# Patient Record
Sex: Male | Born: 1940 | Race: White | Hispanic: No | Marital: Married | Smoking: Former smoker
Health system: Southern US, Community
[De-identification: ages and names within clinical notes are randomized; demographics above are authoritative.]

## PROBLEM LIST (undated history)

## (undated) DIAGNOSIS — Z9981 Dependence on supplemental oxygen: Secondary | ICD-10-CM

## (undated) DIAGNOSIS — M109 Gout, unspecified: Secondary | ICD-10-CM

## (undated) DIAGNOSIS — F419 Anxiety disorder, unspecified: Secondary | ICD-10-CM

## (undated) DIAGNOSIS — N2581 Secondary hyperparathyroidism of renal origin: Secondary | ICD-10-CM

## (undated) DIAGNOSIS — I2699 Other pulmonary embolism without acute cor pulmonale: Secondary | ICD-10-CM

## (undated) DIAGNOSIS — E119 Type 2 diabetes mellitus without complications: Secondary | ICD-10-CM

## (undated) DIAGNOSIS — D649 Anemia, unspecified: Secondary | ICD-10-CM

## (undated) DIAGNOSIS — I5042 Chronic combined systolic (congestive) and diastolic (congestive) heart failure: Secondary | ICD-10-CM

## (undated) DIAGNOSIS — I739 Peripheral vascular disease, unspecified: Secondary | ICD-10-CM

## (undated) DIAGNOSIS — I2510 Atherosclerotic heart disease of native coronary artery without angina pectoris: Secondary | ICD-10-CM

## (undated) DIAGNOSIS — E1121 Type 2 diabetes mellitus with diabetic nephropathy: Secondary | ICD-10-CM

## (undated) DIAGNOSIS — K219 Gastro-esophageal reflux disease without esophagitis: Secondary | ICD-10-CM

## (undated) DIAGNOSIS — C44611 Basal cell carcinoma of skin of unspecified upper limb, including shoulder: Secondary | ICD-10-CM

## (undated) DIAGNOSIS — I10 Essential (primary) hypertension: Secondary | ICD-10-CM

## (undated) DIAGNOSIS — J439 Emphysema, unspecified: Secondary | ICD-10-CM

## (undated) DIAGNOSIS — R05 Cough: Secondary | ICD-10-CM

## (undated) DIAGNOSIS — F32A Depression, unspecified: Secondary | ICD-10-CM

## (undated) DIAGNOSIS — I69959 Hemiplegia and hemiparesis following unspecified cerebrovascular disease affecting unspecified side: Secondary | ICD-10-CM

## (undated) DIAGNOSIS — R609 Edema, unspecified: Secondary | ICD-10-CM

## (undated) DIAGNOSIS — F329 Major depressive disorder, single episode, unspecified: Secondary | ICD-10-CM

## (undated) DIAGNOSIS — N529 Male erectile dysfunction, unspecified: Secondary | ICD-10-CM

## (undated) DIAGNOSIS — T7840XA Allergy, unspecified, initial encounter: Secondary | ICD-10-CM

## (undated) DIAGNOSIS — Z125 Encounter for screening for malignant neoplasm of prostate: Secondary | ICD-10-CM

## (undated) DIAGNOSIS — M1990 Unspecified osteoarthritis, unspecified site: Secondary | ICD-10-CM

## (undated) DIAGNOSIS — C44621 Squamous cell carcinoma of skin of unspecified upper limb, including shoulder: Secondary | ICD-10-CM

## (undated) DIAGNOSIS — N183 Chronic kidney disease, stage 3 unspecified: Secondary | ICD-10-CM

## (undated) DIAGNOSIS — R0902 Hypoxemia: Secondary | ICD-10-CM

## (undated) DIAGNOSIS — M810 Age-related osteoporosis without current pathological fracture: Secondary | ICD-10-CM

## (undated) DIAGNOSIS — I219 Acute myocardial infarction, unspecified: Secondary | ICD-10-CM

## (undated) DIAGNOSIS — R059 Cough, unspecified: Secondary | ICD-10-CM

## (undated) DIAGNOSIS — E785 Hyperlipidemia, unspecified: Secondary | ICD-10-CM

## (undated) DIAGNOSIS — Z9289 Personal history of other medical treatment: Secondary | ICD-10-CM

## (undated) DIAGNOSIS — J449 Chronic obstructive pulmonary disease, unspecified: Secondary | ICD-10-CM

## (undated) DIAGNOSIS — I639 Cerebral infarction, unspecified: Secondary | ICD-10-CM

## (undated) HISTORY — DX: Male erectile dysfunction, unspecified: N52.9

## (undated) HISTORY — DX: Atherosclerotic heart disease of native coronary artery without angina pectoris: I25.10

## (undated) HISTORY — DX: Encounter for screening for malignant neoplasm of prostate: Z12.5

## (undated) HISTORY — DX: Edema, unspecified: R60.9

## (undated) HISTORY — DX: Anxiety disorder, unspecified: F41.9

## (undated) HISTORY — DX: Chronic kidney disease, stage 3 unspecified: N18.30

## (undated) HISTORY — DX: Emphysema, unspecified: J43.9

## (undated) HISTORY — PX: CATARACT EXTRACTION W/ INTRAOCULAR LENS  IMPLANT, BILATERAL: SHX1307

## (undated) HISTORY — DX: Hemiplegia and hemiparesis following unspecified cerebrovascular disease affecting unspecified side: I69.959

## (undated) HISTORY — DX: Unspecified osteoarthritis, unspecified site: M19.90

## (undated) HISTORY — DX: Age-related osteoporosis without current pathological fracture: M81.0

## (undated) HISTORY — DX: Allergy, unspecified, initial encounter: T78.40XA

## (undated) HISTORY — DX: Hyperlipidemia, unspecified: E78.5

## (undated) HISTORY — DX: Chronic kidney disease, stage 3 (moderate): N18.3

## (undated) HISTORY — DX: Hypoxemia: R09.02

## (undated) HISTORY — DX: Essential (primary) hypertension: I10

## (undated) HISTORY — PX: CORONARY ANGIOPLASTY WITH STENT PLACEMENT: SHX49

## (undated) HISTORY — PX: CORONARY ANGIOPLASTY: SHX604

## (undated) HISTORY — DX: Cough: R05

## (undated) HISTORY — DX: Secondary hyperparathyroidism of renal origin: N25.81

## (undated) HISTORY — PX: NASAL SINUS SURGERY: SHX719

## (undated) HISTORY — DX: Cough, unspecified: R05.9

## (undated) HISTORY — DX: Anemia, unspecified: D64.9

## (undated) HISTORY — DX: Chronic combined systolic (congestive) and diastolic (congestive) heart failure: I50.42

## (undated) HISTORY — DX: Peripheral vascular disease, unspecified: I73.9

## (undated) HISTORY — DX: Type 2 diabetes mellitus with diabetic nephropathy: E11.21

## (undated) HISTORY — DX: Chronic obstructive pulmonary disease, unspecified: J44.9

## (undated) HISTORY — PX: BASAL CELL CARCINOMA EXCISION: SHX1214

## (undated) HISTORY — PX: EYE SURGERY: SHX253

## (undated) HISTORY — DX: Gout, unspecified: M10.9

---

## 1967-12-25 HISTORY — PX: ABDOMINAL SURGERY: SHX537

## 1983-12-25 DIAGNOSIS — I219 Acute myocardial infarction, unspecified: Secondary | ICD-10-CM

## 1983-12-25 HISTORY — DX: Acute myocardial infarction, unspecified: I21.9

## 1998-11-01 ENCOUNTER — Other Ambulatory Visit: Admission: RE | Admit: 1998-11-01 | Discharge: 1998-11-01 | Payer: Self-pay | Admitting: Urology

## 2000-02-08 ENCOUNTER — Ambulatory Visit (HOSPITAL_COMMUNITY): Admission: RE | Admit: 2000-02-08 | Discharge: 2000-02-08 | Payer: Self-pay | Admitting: Endocrinology

## 2000-02-08 ENCOUNTER — Encounter: Payer: Self-pay | Admitting: Endocrinology

## 2002-12-24 HISTORY — PX: COLONOSCOPY: SHX174

## 2003-04-13 ENCOUNTER — Encounter: Payer: Self-pay | Admitting: Cardiology

## 2003-04-13 ENCOUNTER — Ambulatory Visit (HOSPITAL_COMMUNITY): Admission: RE | Admit: 2003-04-13 | Discharge: 2003-04-14 | Payer: Self-pay | Admitting: Cardiology

## 2003-09-08 ENCOUNTER — Ambulatory Visit: Admission: RE | Admit: 2003-09-08 | Discharge: 2003-09-08 | Payer: Self-pay | Admitting: Endocrinology

## 2003-11-16 ENCOUNTER — Ambulatory Visit (HOSPITAL_COMMUNITY): Admission: RE | Admit: 2003-11-16 | Discharge: 2003-11-16 | Payer: Self-pay | Admitting: Cardiology

## 2004-08-07 ENCOUNTER — Encounter: Admission: RE | Admit: 2004-08-07 | Discharge: 2004-08-07 | Payer: Self-pay | Admitting: *Deleted

## 2004-08-08 ENCOUNTER — Encounter (INDEPENDENT_AMBULATORY_CARE_PROVIDER_SITE_OTHER): Payer: Self-pay | Admitting: *Deleted

## 2004-08-08 ENCOUNTER — Ambulatory Visit (HOSPITAL_BASED_OUTPATIENT_CLINIC_OR_DEPARTMENT_OTHER): Admission: RE | Admit: 2004-08-08 | Discharge: 2004-08-08 | Payer: Self-pay | Admitting: *Deleted

## 2004-08-08 ENCOUNTER — Ambulatory Visit (HOSPITAL_COMMUNITY): Admission: RE | Admit: 2004-08-08 | Discharge: 2004-08-08 | Payer: Self-pay | Admitting: *Deleted

## 2004-10-24 ENCOUNTER — Ambulatory Visit: Payer: Self-pay | Admitting: Endocrinology

## 2004-10-31 ENCOUNTER — Encounter: Admission: RE | Admit: 2004-10-31 | Discharge: 2004-10-31 | Payer: Self-pay | Admitting: Nephrology

## 2004-12-13 ENCOUNTER — Ambulatory Visit: Payer: Self-pay | Admitting: Endocrinology

## 2004-12-20 ENCOUNTER — Ambulatory Visit: Payer: Self-pay | Admitting: Endocrinology

## 2005-01-01 ENCOUNTER — Ambulatory Visit: Payer: Self-pay | Admitting: Cardiology

## 2005-01-03 ENCOUNTER — Ambulatory Visit: Payer: Self-pay | Admitting: Gastroenterology

## 2005-01-18 ENCOUNTER — Emergency Department (HOSPITAL_COMMUNITY): Admission: EM | Admit: 2005-01-18 | Discharge: 2005-01-18 | Payer: Self-pay

## 2005-01-18 ENCOUNTER — Ambulatory Visit: Payer: Self-pay | Admitting: Gastroenterology

## 2005-01-18 HISTORY — PX: CARDIAC CATHETERIZATION: SHX172

## 2005-01-23 ENCOUNTER — Ambulatory Visit (HOSPITAL_COMMUNITY): Admission: RE | Admit: 2005-01-23 | Discharge: 2005-01-23 | Payer: Self-pay | Admitting: Gastroenterology

## 2005-01-24 ENCOUNTER — Ambulatory Visit: Payer: Self-pay | Admitting: Endocrinology

## 2005-01-24 ENCOUNTER — Ambulatory Visit: Payer: Self-pay | Admitting: Internal Medicine

## 2005-01-24 ENCOUNTER — Inpatient Hospital Stay (HOSPITAL_COMMUNITY): Admission: EM | Admit: 2005-01-24 | Discharge: 2005-01-28 | Payer: Self-pay | Admitting: Emergency Medicine

## 2005-01-26 ENCOUNTER — Ambulatory Visit: Payer: Self-pay | Admitting: Gastroenterology

## 2005-01-30 ENCOUNTER — Ambulatory Visit: Payer: Self-pay | Admitting: Endocrinology

## 2005-01-31 ENCOUNTER — Ambulatory Visit: Payer: Self-pay | Admitting: Internal Medicine

## 2005-02-02 ENCOUNTER — Ambulatory Visit: Payer: Self-pay | Admitting: Cardiology

## 2005-02-12 ENCOUNTER — Ambulatory Visit: Payer: Self-pay | Admitting: Internal Medicine

## 2005-02-16 ENCOUNTER — Ambulatory Visit: Payer: Self-pay | Admitting: Cardiology

## 2005-02-22 ENCOUNTER — Ambulatory Visit: Payer: Self-pay | Admitting: Cardiology

## 2005-02-28 ENCOUNTER — Ambulatory Visit: Payer: Self-pay | Admitting: Endocrinology

## 2005-03-01 ENCOUNTER — Ambulatory Visit: Payer: Self-pay | Admitting: Endocrinology

## 2005-03-01 ENCOUNTER — Ambulatory Visit: Payer: Self-pay | Admitting: *Deleted

## 2005-03-12 ENCOUNTER — Ambulatory Visit: Payer: Self-pay | Admitting: Endocrinology

## 2005-03-22 ENCOUNTER — Ambulatory Visit: Payer: Self-pay | Admitting: Internal Medicine

## 2005-03-26 ENCOUNTER — Ambulatory Visit: Payer: Self-pay | Admitting: Endocrinology

## 2005-04-09 ENCOUNTER — Ambulatory Visit: Payer: Self-pay | Admitting: Internal Medicine

## 2005-04-10 ENCOUNTER — Ambulatory Visit: Payer: Self-pay | Admitting: Cardiology

## 2005-04-12 ENCOUNTER — Ambulatory Visit: Payer: Self-pay | Admitting: Endocrinology

## 2005-04-13 ENCOUNTER — Ambulatory Visit: Payer: Self-pay | Admitting: Cardiology

## 2005-04-19 ENCOUNTER — Ambulatory Visit: Payer: Self-pay | Admitting: Endocrinology

## 2005-05-03 ENCOUNTER — Ambulatory Visit: Payer: Self-pay | Admitting: Endocrinology

## 2005-05-17 ENCOUNTER — Ambulatory Visit: Payer: Self-pay | Admitting: Endocrinology

## 2005-05-25 ENCOUNTER — Ambulatory Visit: Payer: Self-pay | Admitting: Internal Medicine

## 2005-05-25 LAB — PULMONARY FUNCTION TEST

## 2005-06-01 ENCOUNTER — Ambulatory Visit: Payer: Self-pay | Admitting: Endocrinology

## 2005-07-18 ENCOUNTER — Ambulatory Visit (HOSPITAL_COMMUNITY): Admission: RE | Admit: 2005-07-18 | Discharge: 2005-07-18 | Payer: Self-pay | Admitting: Endocrinology

## 2005-07-18 ENCOUNTER — Ambulatory Visit: Payer: Self-pay | Admitting: Endocrinology

## 2005-08-16 ENCOUNTER — Ambulatory Visit: Payer: Self-pay | Admitting: Endocrinology

## 2005-08-22 ENCOUNTER — Ambulatory Visit: Payer: Self-pay | Admitting: Endocrinology

## 2005-08-31 ENCOUNTER — Ambulatory Visit: Payer: Self-pay | Admitting: Endocrinology

## 2005-10-09 ENCOUNTER — Ambulatory Visit: Payer: Self-pay | Admitting: Endocrinology

## 2005-11-19 ENCOUNTER — Ambulatory Visit: Payer: Self-pay | Admitting: Endocrinology

## 2005-12-18 ENCOUNTER — Ambulatory Visit: Payer: Self-pay | Admitting: Endocrinology

## 2005-12-24 DIAGNOSIS — I639 Cerebral infarction, unspecified: Secondary | ICD-10-CM

## 2005-12-24 HISTORY — DX: Cerebral infarction, unspecified: I63.9

## 2005-12-26 ENCOUNTER — Ambulatory Visit: Payer: Self-pay | Admitting: Endocrinology

## 2006-01-23 ENCOUNTER — Ambulatory Visit: Payer: Self-pay | Admitting: Cardiology

## 2006-01-30 ENCOUNTER — Ambulatory Visit: Payer: Self-pay

## 2006-02-11 ENCOUNTER — Ambulatory Visit: Payer: Self-pay | Admitting: Cardiology

## 2006-02-15 ENCOUNTER — Inpatient Hospital Stay (HOSPITAL_BASED_OUTPATIENT_CLINIC_OR_DEPARTMENT_OTHER): Admission: RE | Admit: 2006-02-15 | Discharge: 2006-02-15 | Payer: Self-pay | Admitting: Cardiology

## 2006-02-15 ENCOUNTER — Ambulatory Visit: Payer: Self-pay | Admitting: Cardiology

## 2006-02-19 ENCOUNTER — Encounter: Admission: RE | Admit: 2006-02-19 | Discharge: 2006-02-19 | Payer: Self-pay | Admitting: Endocrinology

## 2006-02-19 ENCOUNTER — Ambulatory Visit: Payer: Self-pay | Admitting: Endocrinology

## 2006-02-20 ENCOUNTER — Ambulatory Visit: Payer: Self-pay | Admitting: Internal Medicine

## 2006-02-26 ENCOUNTER — Ambulatory Visit: Payer: Self-pay | Admitting: Internal Medicine

## 2006-03-13 ENCOUNTER — Ambulatory Visit: Payer: Self-pay

## 2006-03-19 ENCOUNTER — Ambulatory Visit: Payer: Self-pay | Admitting: Endocrinology

## 2006-04-03 ENCOUNTER — Ambulatory Visit: Payer: Self-pay

## 2006-04-03 ENCOUNTER — Encounter: Payer: Self-pay | Admitting: Cardiology

## 2006-04-16 ENCOUNTER — Ambulatory Visit: Payer: Self-pay | Admitting: Endocrinology

## 2006-05-09 ENCOUNTER — Ambulatory Visit: Payer: Self-pay | Admitting: Cardiology

## 2006-05-14 ENCOUNTER — Ambulatory Visit: Payer: Self-pay | Admitting: Cardiology

## 2006-05-15 ENCOUNTER — Ambulatory Visit: Payer: Self-pay | Admitting: Endocrinology

## 2006-05-23 ENCOUNTER — Ambulatory Visit: Payer: Self-pay | Admitting: Cardiology

## 2006-06-04 ENCOUNTER — Ambulatory Visit: Payer: Self-pay | Admitting: Endocrinology

## 2006-06-17 ENCOUNTER — Ambulatory Visit: Payer: Self-pay | Admitting: Endocrinology

## 2006-06-19 ENCOUNTER — Ambulatory Visit: Payer: Self-pay | Admitting: Cardiology

## 2006-06-27 ENCOUNTER — Ambulatory Visit: Payer: Self-pay | Admitting: Cardiology

## 2006-07-29 ENCOUNTER — Ambulatory Visit: Payer: Self-pay | Admitting: Cardiology

## 2006-08-20 ENCOUNTER — Ambulatory Visit: Payer: Self-pay | Admitting: Internal Medicine

## 2006-08-20 ENCOUNTER — Inpatient Hospital Stay (HOSPITAL_COMMUNITY): Admission: EM | Admit: 2006-08-20 | Discharge: 2006-08-23 | Payer: Self-pay | Admitting: Emergency Medicine

## 2006-08-27 ENCOUNTER — Ambulatory Visit: Payer: Self-pay | Admitting: Endocrinology

## 2006-08-30 ENCOUNTER — Ambulatory Visit: Payer: Self-pay | Admitting: Cardiology

## 2006-09-24 ENCOUNTER — Ambulatory Visit: Payer: Self-pay | Admitting: Endocrinology

## 2006-09-25 ENCOUNTER — Ambulatory Visit: Payer: Self-pay | Admitting: Cardiology

## 2006-11-04 ENCOUNTER — Ambulatory Visit: Payer: Self-pay | Admitting: Internal Medicine

## 2006-12-23 ENCOUNTER — Ambulatory Visit: Payer: Self-pay | Admitting: Endocrinology

## 2006-12-23 LAB — CONVERTED CEMR LAB
Hgb A1c MFr Bld: 6.6 % — ABNORMAL HIGH (ref 4.6–6.0)
Microalb Creat Ratio: 55.6 mg/g — ABNORMAL HIGH (ref 0.0–30.0)

## 2006-12-26 ENCOUNTER — Ambulatory Visit: Payer: Self-pay | Admitting: Endocrinology

## 2007-04-07 ENCOUNTER — Ambulatory Visit: Payer: Self-pay | Admitting: Cardiology

## 2007-04-07 LAB — CONVERTED CEMR LAB
BUN: 26 mg/dL — ABNORMAL HIGH (ref 6–23)
Basophils Relative: 0 % (ref 0.0–1.0)
CO2: 33 meq/L — ABNORMAL HIGH (ref 19–32)
Calcium: 8.6 mg/dL (ref 8.4–10.5)
Eosinophils Relative: 4.6 % (ref 0.0–5.0)
GFR calc Af Amer: 49 mL/min
GFR calc non Af Amer: 41 mL/min
Glucose, Bld: 113 mg/dL — ABNORMAL HIGH (ref 70–99)
Hemoglobin: 13.3 g/dL (ref 13.0–17.0)
Lymphocytes Relative: 16.1 % (ref 12.0–46.0)
Monocytes Relative: 6.4 % (ref 3.0–11.0)
Potassium: 3.8 meq/L (ref 3.5–5.1)
RDW: 14.5 % (ref 11.5–14.6)
WBC: 11.3 10*3/uL — ABNORMAL HIGH (ref 4.5–10.5)

## 2007-04-09 ENCOUNTER — Ambulatory Visit: Payer: Self-pay | Admitting: Endocrinology

## 2007-05-05 ENCOUNTER — Ambulatory Visit: Payer: Self-pay | Admitting: Endocrinology

## 2007-05-05 LAB — CONVERTED CEMR LAB
AST: 17 units/L (ref 0–37)
Albumin: 3.5 g/dL (ref 3.5–5.2)
Bacteria, UA: NEGATIVE
Basophils Relative: 0.9 % (ref 0.0–1.0)
Bilirubin Urine: NEGATIVE
CO2: 31 meq/L (ref 19–32)
Chloride: 105 meq/L (ref 96–112)
Cholesterol: 100 mg/dL (ref 0–200)
Creatinine, Ser: 1.6 mg/dL — ABNORMAL HIGH (ref 0.4–1.5)
Creatinine,U: 30.8 mg/dL
Crystals: NEGATIVE
Glucose, Bld: 158 mg/dL — ABNORMAL HIGH (ref 70–99)
HCT: 38.8 % — ABNORMAL LOW (ref 39.0–52.0)
Hemoglobin, Urine: NEGATIVE
Hemoglobin: 13.5 g/dL (ref 13.0–17.0)
LDL Cholesterol: 46 mg/dL (ref 0–99)
Lymphocytes Relative: 19.8 % (ref 12.0–46.0)
Monocytes Absolute: 0.5 10*3/uL (ref 0.2–0.7)
Mucus, UA: NEGATIVE
Neutro Abs: 4.8 10*3/uL (ref 1.4–7.7)
Neutrophils Relative %: 68.1 % (ref 43.0–77.0)
Nitrite: NEGATIVE
PSA: 0.91 ng/mL (ref 0.10–4.00)
RDW: 14.2 % (ref 11.5–14.6)
Sodium: 141 meq/L (ref 135–145)
Specific Gravity, Urine: 1.01 (ref 1.000–1.03)
TSH: 0.98 microintl units/mL (ref 0.35–5.50)
Total Bilirubin: 0.6 mg/dL (ref 0.3–1.2)
Total Protein, Urine: NEGATIVE mg/dL
Total Protein: 6.6 g/dL (ref 6.0–8.3)
Urobilinogen, UA: 0.2 (ref 0.0–1.0)
VLDL: 23 mg/dL (ref 0–40)

## 2007-05-12 ENCOUNTER — Encounter: Payer: Self-pay | Admitting: Endocrinology

## 2007-05-12 DIAGNOSIS — I69959 Hemiplegia and hemiparesis following unspecified cerebrovascular disease affecting unspecified side: Secondary | ICD-10-CM | POA: Insufficient documentation

## 2007-05-12 DIAGNOSIS — R51 Headache: Secondary | ICD-10-CM | POA: Insufficient documentation

## 2007-05-12 DIAGNOSIS — M1990 Unspecified osteoarthritis, unspecified site: Secondary | ICD-10-CM | POA: Insufficient documentation

## 2007-05-12 DIAGNOSIS — R519 Headache, unspecified: Secondary | ICD-10-CM | POA: Insufficient documentation

## 2007-05-12 DIAGNOSIS — J439 Emphysema, unspecified: Secondary | ICD-10-CM | POA: Insufficient documentation

## 2007-05-12 DIAGNOSIS — J309 Allergic rhinitis, unspecified: Secondary | ICD-10-CM

## 2007-05-12 DIAGNOSIS — E118 Type 2 diabetes mellitus with unspecified complications: Secondary | ICD-10-CM

## 2007-05-12 DIAGNOSIS — N529 Male erectile dysfunction, unspecified: Secondary | ICD-10-CM | POA: Insufficient documentation

## 2007-05-12 DIAGNOSIS — F528 Other sexual dysfunction not due to a substance or known physiological condition: Secondary | ICD-10-CM

## 2007-05-12 DIAGNOSIS — E1122 Type 2 diabetes mellitus with diabetic chronic kidney disease: Secondary | ICD-10-CM | POA: Insufficient documentation

## 2007-05-12 DIAGNOSIS — E785 Hyperlipidemia, unspecified: Secondary | ICD-10-CM | POA: Insufficient documentation

## 2007-05-22 ENCOUNTER — Inpatient Hospital Stay (HOSPITAL_COMMUNITY): Admission: EM | Admit: 2007-05-22 | Discharge: 2007-05-30 | Payer: Self-pay | Admitting: Emergency Medicine

## 2007-05-22 ENCOUNTER — Ambulatory Visit: Payer: Self-pay | Admitting: Cardiology

## 2007-05-22 ENCOUNTER — Ambulatory Visit: Payer: Self-pay | Admitting: Internal Medicine

## 2007-05-27 ENCOUNTER — Encounter: Payer: Self-pay | Admitting: Cardiology

## 2007-06-02 ENCOUNTER — Ambulatory Visit: Payer: Self-pay | Admitting: Internal Medicine

## 2007-06-06 ENCOUNTER — Ambulatory Visit: Payer: Self-pay | Admitting: Cardiovascular Disease

## 2007-06-10 ENCOUNTER — Ambulatory Visit: Payer: Self-pay | Admitting: Internal Medicine

## 2007-06-10 LAB — CONVERTED CEMR LAB
Basophils Relative: 0.4 % (ref 0.0–1.0)
CO2: 33 meq/L — ABNORMAL HIGH (ref 19–32)
Creatinine, Ser: 1.8 mg/dL — ABNORMAL HIGH (ref 0.4–1.5)
HCT: 38 % — ABNORMAL LOW (ref 39.0–52.0)
Hemoglobin: 12.8 g/dL — ABNORMAL LOW (ref 13.0–17.0)
MCHC: 33.7 g/dL (ref 30.0–36.0)
Monocytes Absolute: 0.7 10*3/uL (ref 0.2–0.7)
Neutrophils Relative %: 79.3 % — ABNORMAL HIGH (ref 43.0–77.0)
Potassium: 4.1 meq/L (ref 3.5–5.1)
RDW: 14.6 % (ref 11.5–14.6)
Sodium: 138 meq/L (ref 135–145)

## 2007-06-13 ENCOUNTER — Ambulatory Visit: Payer: Self-pay | Admitting: Cardiovascular Disease

## 2007-06-17 ENCOUNTER — Ambulatory Visit: Payer: Self-pay | Admitting: Internal Medicine

## 2007-06-17 LAB — CONVERTED CEMR LAB
CO2: 30 meq/L (ref 19–32)
GFR calc Af Amer: 52 mL/min
Glucose, Bld: 220 mg/dL — ABNORMAL HIGH (ref 70–99)
Potassium: 3.7 meq/L (ref 3.5–5.1)

## 2007-06-20 ENCOUNTER — Ambulatory Visit: Payer: Self-pay | Admitting: Cardiology

## 2007-06-26 ENCOUNTER — Ambulatory Visit: Payer: Self-pay | Admitting: Cardiology

## 2007-06-30 ENCOUNTER — Ambulatory Visit: Payer: Self-pay | Admitting: Pulmonary Disease

## 2007-07-04 ENCOUNTER — Ambulatory Visit: Payer: Self-pay | Admitting: Cardiology

## 2007-07-08 ENCOUNTER — Ambulatory Visit: Payer: Self-pay | Admitting: Endocrinology

## 2007-07-18 ENCOUNTER — Ambulatory Visit: Payer: Self-pay | Admitting: Cardiovascular Disease

## 2007-07-23 ENCOUNTER — Encounter: Admission: RE | Admit: 2007-07-23 | Discharge: 2007-10-21 | Payer: Self-pay | Admitting: Endocrinology

## 2007-07-31 ENCOUNTER — Ambulatory Visit: Payer: Self-pay | Admitting: Cardiovascular Disease

## 2007-08-22 ENCOUNTER — Ambulatory Visit: Payer: Self-pay | Admitting: Cardiovascular Disease

## 2007-09-01 ENCOUNTER — Encounter: Payer: Self-pay | Admitting: Endocrinology

## 2007-09-01 DIAGNOSIS — N259 Disorder resulting from impaired renal tubular function, unspecified: Secondary | ICD-10-CM | POA: Insufficient documentation

## 2007-09-01 DIAGNOSIS — I739 Peripheral vascular disease, unspecified: Secondary | ICD-10-CM | POA: Insufficient documentation

## 2007-09-02 ENCOUNTER — Ambulatory Visit: Payer: Self-pay | Admitting: Endocrinology

## 2007-09-03 ENCOUNTER — Ambulatory Visit: Payer: Self-pay | Admitting: Cardiology

## 2007-09-03 LAB — CONVERTED CEMR LAB
BUN: 29 mg/dL — ABNORMAL HIGH (ref 6–23)
Chloride: 102 meq/L (ref 96–112)
Creatinine, Ser: 1.8 mg/dL — ABNORMAL HIGH (ref 0.4–1.5)
GFR calc non Af Amer: 40 mL/min
HCT: 36.4 % — ABNORMAL LOW (ref 39.0–52.0)
Hemoglobin: 12.8 g/dL — ABNORMAL LOW (ref 13.0–17.0)
MCHC: 35.2 g/dL (ref 30.0–36.0)
MCV: 87.6 fL (ref 78.0–100.0)
Neutrophils Relative %: 72.8 % (ref 43.0–77.0)
Potassium: 4.3 meq/L (ref 3.5–5.1)
Pro B Natriuretic peptide (BNP): 47 pg/mL (ref 0.0–100.0)
RDW: 16 % — ABNORMAL HIGH (ref 11.5–14.6)

## 2007-09-12 ENCOUNTER — Observation Stay (HOSPITAL_COMMUNITY): Admission: EM | Admit: 2007-09-12 | Discharge: 2007-09-13 | Payer: Self-pay | Admitting: Emergency Medicine

## 2007-09-12 ENCOUNTER — Ambulatory Visit: Payer: Self-pay | Admitting: Cardiology

## 2007-09-12 LAB — CONVERTED CEMR LAB
BUN: 106 mg/dL (ref 6–23)
Creatinine, Ser: 3.1 mg/dL — ABNORMAL HIGH (ref 0.4–1.5)
GFR calc non Af Amer: 22 mL/min
Potassium: 2.2 meq/L — CL (ref 3.5–5.1)

## 2007-09-15 ENCOUNTER — Ambulatory Visit: Payer: Self-pay | Admitting: Internal Medicine

## 2007-09-15 LAB — CONVERTED CEMR LAB
BUN: 45 mg/dL — ABNORMAL HIGH (ref 6–23)
CO2: 36 meq/L — ABNORMAL HIGH (ref 19–32)
Calcium: 8.8 mg/dL (ref 8.4–10.5)
GFR calc Af Amer: 41 mL/min
GFR calc non Af Amer: 34 mL/min
Pro B Natriuretic peptide (BNP): 149 pg/mL — ABNORMAL HIGH (ref 0.0–100.0)

## 2007-09-18 ENCOUNTER — Ambulatory Visit: Payer: Self-pay | Admitting: Cardiology

## 2007-09-18 LAB — CONVERTED CEMR LAB
BUN: 24 mg/dL — ABNORMAL HIGH (ref 6–23)
Calcium: 8.7 mg/dL (ref 8.4–10.5)
GFR calc Af Amer: 52 mL/min
GFR calc non Af Amer: 43 mL/min
Magnesium: 1.8 mg/dL (ref 1.5–2.5)
Potassium: 3.7 meq/L (ref 3.5–5.1)
Pro B Natriuretic peptide (BNP): 290 pg/mL — ABNORMAL HIGH (ref 0.0–100.0)

## 2007-10-01 ENCOUNTER — Ambulatory Visit: Payer: Self-pay | Admitting: Internal Medicine

## 2007-10-01 LAB — CONVERTED CEMR LAB
Albumin: 3.3 g/dL — ABNORMAL LOW (ref 3.5–5.2)
Bilirubin, Direct: 0.1 mg/dL (ref 0.0–0.3)
Pro B Natriuretic peptide (BNP): 106 pg/mL — ABNORMAL HIGH (ref 0.0–100.0)

## 2007-10-02 ENCOUNTER — Ambulatory Visit: Payer: Self-pay | Admitting: Endocrinology

## 2007-10-13 ENCOUNTER — Ambulatory Visit: Payer: Self-pay | Admitting: Cardiology

## 2007-10-13 LAB — CONVERTED CEMR LAB
BUN: 32 mg/dL — ABNORMAL HIGH (ref 6–23)
Chloride: 100 meq/L (ref 96–112)
Creatinine, Ser: 2.2 mg/dL — ABNORMAL HIGH (ref 0.4–1.5)
GFR calc non Af Amer: 32 mL/min

## 2007-10-14 ENCOUNTER — Encounter: Payer: Self-pay | Admitting: Endocrinology

## 2007-10-15 ENCOUNTER — Ambulatory Visit: Payer: Self-pay | Admitting: Cardiology

## 2007-10-21 ENCOUNTER — Ambulatory Visit: Payer: Self-pay | Admitting: Cardiology

## 2007-10-21 LAB — CONVERTED CEMR LAB
BUN: 27 mg/dL — ABNORMAL HIGH (ref 6–23)
CO2: 29 meq/L (ref 19–32)
Calcium: 9.4 mg/dL (ref 8.4–10.5)
Creatinine, Ser: 1.8 mg/dL — ABNORMAL HIGH (ref 0.4–1.5)
GFR calc Af Amer: 49 mL/min
Potassium: 4.2 meq/L (ref 3.5–5.1)

## 2007-10-29 ENCOUNTER — Ambulatory Visit: Payer: Self-pay | Admitting: Cardiology

## 2007-11-03 ENCOUNTER — Encounter: Payer: Self-pay | Admitting: Endocrinology

## 2007-11-05 ENCOUNTER — Telehealth (INDEPENDENT_AMBULATORY_CARE_PROVIDER_SITE_OTHER): Payer: Self-pay | Admitting: *Deleted

## 2007-11-12 ENCOUNTER — Ambulatory Visit: Payer: Self-pay | Admitting: Cardiology

## 2007-11-25 ENCOUNTER — Ambulatory Visit: Payer: Self-pay | Admitting: Cardiology

## 2007-12-03 ENCOUNTER — Ambulatory Visit: Payer: Self-pay | Admitting: Cardiology

## 2007-12-03 ENCOUNTER — Ambulatory Visit: Payer: Self-pay | Admitting: Cardiovascular Disease

## 2007-12-03 LAB — CONVERTED CEMR LAB
Basophils Absolute: 0 10*3/uL (ref 0.0–0.1)
Chloride: 98 meq/L (ref 96–112)
Creatinine, Ser: 1.9 mg/dL — ABNORMAL HIGH (ref 0.4–1.5)
Eosinophils Relative: 2 % (ref 0.0–5.0)
Glucose, Bld: 132 mg/dL — ABNORMAL HIGH (ref 70–99)
HCT: 37.9 % — ABNORMAL LOW (ref 39.0–52.0)
Hemoglobin: 12.7 g/dL — ABNORMAL LOW (ref 13.0–17.0)
MCHC: 33.6 g/dL (ref 30.0–36.0)
MCV: 88.4 fL (ref 78.0–100.0)
Monocytes Absolute: 0.7 10*3/uL (ref 0.2–0.7)
Neutrophils Relative %: 74.5 % (ref 43.0–77.0)
Potassium: 3.8 meq/L (ref 3.5–5.1)
Pro B Natriuretic peptide (BNP): 108 pg/mL — ABNORMAL HIGH (ref 0.0–100.0)
RBC: 4.28 M/uL (ref 4.22–5.81)
RDW: 15.9 % — ABNORMAL HIGH (ref 11.5–14.6)
Sodium: 138 meq/L (ref 135–145)
WBC: 8.7 10*3/uL (ref 4.5–10.5)

## 2007-12-26 ENCOUNTER — Ambulatory Visit: Payer: Self-pay | Admitting: Endocrinology

## 2007-12-31 ENCOUNTER — Ambulatory Visit: Payer: Self-pay | Admitting: Cardiovascular Disease

## 2008-01-14 ENCOUNTER — Ambulatory Visit: Payer: Self-pay | Admitting: Internal Medicine

## 2008-01-27 ENCOUNTER — Encounter: Payer: Self-pay | Admitting: Endocrinology

## 2008-01-28 ENCOUNTER — Ambulatory Visit: Payer: Self-pay | Admitting: Internal Medicine

## 2008-02-11 ENCOUNTER — Ambulatory Visit: Payer: Self-pay | Admitting: Internal Medicine

## 2008-03-02 ENCOUNTER — Ambulatory Visit: Payer: Self-pay | Admitting: Cardiology

## 2008-03-02 LAB — CONVERTED CEMR LAB
Basophils Absolute: 0.1 10*3/uL (ref 0.0–0.1)
Chloride: 101 meq/L (ref 96–112)
Eosinophils Absolute: 0.3 10*3/uL (ref 0.0–0.6)
Eosinophils Relative: 3.6 % (ref 0.0–5.0)
GFR calc non Af Amer: 40 mL/min
Glucose, Bld: 138 mg/dL — ABNORMAL HIGH (ref 70–99)
HCT: 39.3 % (ref 39.0–52.0)
Hemoglobin: 13 g/dL (ref 13.0–17.0)
Lymphocytes Relative: 17.4 % (ref 12.0–46.0)
MCHC: 33 g/dL (ref 30.0–36.0)
MCV: 90 fL (ref 78.0–100.0)
Monocytes Absolute: 0.8 10*3/uL — ABNORMAL HIGH (ref 0.2–0.7)
Neutro Abs: 6.3 10*3/uL (ref 1.4–7.7)
Neutrophils Relative %: 69.1 % (ref 43.0–77.0)
Potassium: 3.6 meq/L (ref 3.5–5.1)
Pro B Natriuretic peptide (BNP): 182 pg/mL — ABNORMAL HIGH (ref 0.0–100.0)
RBC: 4.37 M/uL (ref 4.22–5.81)
Sodium: 140 meq/L (ref 135–145)
WBC: 9.1 10*3/uL (ref 4.5–10.5)

## 2008-03-10 ENCOUNTER — Ambulatory Visit: Payer: Self-pay | Admitting: Cardiology

## 2008-03-24 ENCOUNTER — Telehealth: Payer: Self-pay | Admitting: Endocrinology

## 2008-03-24 ENCOUNTER — Ambulatory Visit: Payer: Self-pay | Admitting: Internal Medicine

## 2008-03-24 ENCOUNTER — Encounter: Payer: Self-pay | Admitting: Internal Medicine

## 2008-03-24 ENCOUNTER — Encounter: Payer: Self-pay | Admitting: Endocrinology

## 2008-03-25 ENCOUNTER — Ambulatory Visit: Payer: Self-pay | Admitting: Endocrinology

## 2008-03-25 ENCOUNTER — Telehealth: Payer: Self-pay | Admitting: Endocrinology

## 2008-03-25 DIAGNOSIS — M109 Gout, unspecified: Secondary | ICD-10-CM | POA: Insufficient documentation

## 2008-03-25 DIAGNOSIS — N2581 Secondary hyperparathyroidism of renal origin: Secondary | ICD-10-CM

## 2008-03-25 LAB — CONVERTED CEMR LAB: PTH: 48 pg/mL (ref 14.0–72.0)

## 2008-03-30 ENCOUNTER — Telehealth (INDEPENDENT_AMBULATORY_CARE_PROVIDER_SITE_OTHER): Payer: Self-pay | Admitting: *Deleted

## 2008-03-31 ENCOUNTER — Ambulatory Visit: Payer: Self-pay | Admitting: Cardiology

## 2008-03-31 LAB — CONVERTED CEMR LAB
ALT: 25 units/L (ref 0–53)
AST: 21 units/L (ref 0–37)
Albumin: 3.3 g/dL — ABNORMAL LOW (ref 3.5–5.2)
HDL: 33.6 mg/dL — ABNORMAL LOW (ref >39.0)
Total CHOL/HDL Ratio: 3.2
Triglycerides: 133 mg/dL (ref 0–149)
VLDL: 27 mg/dL (ref 0–40)

## 2008-04-01 ENCOUNTER — Ambulatory Visit: Payer: Self-pay | Admitting: Endocrinology

## 2008-04-01 DIAGNOSIS — I10 Essential (primary) hypertension: Secondary | ICD-10-CM | POA: Insufficient documentation

## 2008-04-07 ENCOUNTER — Ambulatory Visit: Payer: Self-pay | Admitting: Cardiology

## 2008-04-16 ENCOUNTER — Encounter: Payer: Self-pay | Admitting: Endocrinology

## 2008-04-16 DIAGNOSIS — M810 Age-related osteoporosis without current pathological fracture: Secondary | ICD-10-CM | POA: Insufficient documentation

## 2008-05-05 ENCOUNTER — Ambulatory Visit: Payer: Self-pay | Admitting: Cardiology

## 2008-06-03 ENCOUNTER — Ambulatory Visit: Payer: Self-pay | Admitting: Endocrinology

## 2008-06-03 ENCOUNTER — Ambulatory Visit: Payer: Self-pay | Admitting: Cardiology

## 2008-06-03 DIAGNOSIS — E876 Hypokalemia: Secondary | ICD-10-CM | POA: Insufficient documentation

## 2008-06-03 DIAGNOSIS — I509 Heart failure, unspecified: Secondary | ICD-10-CM | POA: Insufficient documentation

## 2008-06-03 LAB — CONVERTED CEMR LAB
CO2: 33 meq/L — ABNORMAL HIGH (ref 19–32)
Calcium: 9.2 mg/dL (ref 8.4–10.5)
GFR calc Af Amer: 43 mL/min
Hgb A1c MFr Bld: 7.7 % — ABNORMAL HIGH (ref 4.6–6.0)
Potassium: 3.5 meq/L (ref 3.5–5.1)
Pro B Natriuretic peptide (BNP): 37 pg/mL (ref 0.0–100.0)
Sodium: 133 meq/L — ABNORMAL LOW (ref 135–145)

## 2008-06-11 ENCOUNTER — Ambulatory Visit: Payer: Self-pay

## 2008-06-11 ENCOUNTER — Encounter: Payer: Self-pay | Admitting: Endocrinology

## 2008-06-18 ENCOUNTER — Ambulatory Visit: Payer: Self-pay | Admitting: Endocrinology

## 2008-06-18 DIAGNOSIS — H109 Unspecified conjunctivitis: Secondary | ICD-10-CM | POA: Insufficient documentation

## 2008-06-18 LAB — CONVERTED CEMR LAB
BUN: 28 mg/dL — ABNORMAL HIGH (ref 6–23)
CO2: 30 meq/L (ref 19–32)
Calcium: 9.1 mg/dL (ref 8.4–10.5)
Chloride: 95 meq/L — ABNORMAL LOW (ref 96–112)
Creatinine, Ser: 1.8 mg/dL — ABNORMAL HIGH (ref 0.4–1.5)
GFR calc Af Amer: 49 mL/min
GFR calc non Af Amer: 40 mL/min
Glucose, Bld: 290 mg/dL — ABNORMAL HIGH (ref 70–99)
Potassium: 4.4 meq/L (ref 3.5–5.1)
Pro B Natriuretic peptide (BNP): 74 pg/mL (ref 0.0–100.0)
Sodium: 135 meq/L (ref 135–145)

## 2008-07-01 ENCOUNTER — Ambulatory Visit: Payer: Self-pay | Admitting: Cardiology

## 2008-07-08 ENCOUNTER — Ambulatory Visit: Payer: Self-pay | Admitting: Cardiology

## 2008-07-14 ENCOUNTER — Ambulatory Visit: Payer: Self-pay | Admitting: Cardiology

## 2008-07-14 LAB — CONVERTED CEMR LAB
Calcium: 8.6 mg/dL (ref 8.4–10.5)
Calcium: 8.6 mg/dL (ref 8.4–10.5)
Creatinine, Ser: 1.6 mg/dL — ABNORMAL HIGH (ref 0.4–1.5)
GFR calc Af Amer: 56 mL/min
GFR calc Af Amer: 56 mL/min
GFR calc non Af Amer: 46 mL/min
GFR calc non Af Amer: 46 mL/min
Sodium: 141 meq/L (ref 135–145)

## 2008-07-29 ENCOUNTER — Ambulatory Visit: Payer: Self-pay | Admitting: Internal Medicine

## 2008-08-04 ENCOUNTER — Encounter: Payer: Self-pay | Admitting: Endocrinology

## 2008-08-25 ENCOUNTER — Encounter: Payer: Self-pay | Admitting: Endocrinology

## 2008-08-26 ENCOUNTER — Ambulatory Visit: Payer: Self-pay | Admitting: Internal Medicine

## 2008-09-01 ENCOUNTER — Ambulatory Visit: Payer: Self-pay | Admitting: Endocrinology

## 2008-09-01 LAB — CONVERTED CEMR LAB
Hgb A1c MFr Bld: 7.2 % — ABNORMAL HIGH (ref 4.6–6.0)
PSA: 0.74 ng/mL (ref 0.10–4.00)

## 2008-09-10 ENCOUNTER — Telehealth: Payer: Self-pay | Admitting: Endocrinology

## 2008-09-10 ENCOUNTER — Ambulatory Visit: Payer: Self-pay | Admitting: Endocrinology

## 2008-10-01 ENCOUNTER — Telehealth (INDEPENDENT_AMBULATORY_CARE_PROVIDER_SITE_OTHER): Payer: Self-pay | Admitting: *Deleted

## 2008-10-07 ENCOUNTER — Telehealth: Payer: Self-pay | Admitting: Endocrinology

## 2008-10-18 ENCOUNTER — Ambulatory Visit: Payer: Self-pay | Admitting: Endocrinology

## 2008-10-18 ENCOUNTER — Telehealth (INDEPENDENT_AMBULATORY_CARE_PROVIDER_SITE_OTHER): Payer: Self-pay | Admitting: *Deleted

## 2008-10-19 ENCOUNTER — Encounter: Payer: Self-pay | Admitting: Endocrinology

## 2008-10-22 ENCOUNTER — Ambulatory Visit: Payer: Self-pay | Admitting: Internal Medicine

## 2008-10-22 ENCOUNTER — Ambulatory Visit: Payer: Self-pay | Admitting: Endocrinology

## 2008-10-28 ENCOUNTER — Ambulatory Visit: Payer: Self-pay | Admitting: Endocrinology

## 2008-11-05 ENCOUNTER — Ambulatory Visit: Payer: Self-pay | Admitting: Endocrinology

## 2008-11-22 ENCOUNTER — Ambulatory Visit: Payer: Self-pay | Admitting: Cardiovascular Disease

## 2008-11-22 ENCOUNTER — Ambulatory Visit: Payer: Self-pay | Admitting: Endocrinology

## 2008-11-22 DIAGNOSIS — R609 Edema, unspecified: Secondary | ICD-10-CM | POA: Insufficient documentation

## 2008-11-22 LAB — CONVERTED CEMR LAB
CO2: 31 meq/L (ref 19–32)
Calcium: 8.8 mg/dL (ref 8.4–10.5)
Creatinine, Ser: 1.9 mg/dL — ABNORMAL HIGH (ref 0.4–1.5)
Pro B Natriuretic peptide (BNP): 94 pg/mL (ref 0.0–100.0)

## 2008-11-26 ENCOUNTER — Ambulatory Visit: Payer: Self-pay | Admitting: Endocrinology

## 2008-11-26 LAB — CONVERTED CEMR LAB
CO2: 33 meq/L — ABNORMAL HIGH (ref 19–32)
Calcium: 9.4 mg/dL (ref 8.4–10.5)
Creatinine, Ser: 1.9 mg/dL — ABNORMAL HIGH (ref 0.4–1.5)

## 2008-11-29 ENCOUNTER — Ambulatory Visit: Payer: Self-pay | Admitting: Pulmonary Disease

## 2008-12-07 ENCOUNTER — Telehealth (INDEPENDENT_AMBULATORY_CARE_PROVIDER_SITE_OTHER): Payer: Self-pay | Admitting: *Deleted

## 2008-12-08 ENCOUNTER — Ambulatory Visit: Payer: Self-pay | Admitting: Endocrinology

## 2008-12-08 LAB — CONVERTED CEMR LAB
BUN: 31 mg/dL — ABNORMAL HIGH (ref 6–23)
Calcium: 9.2 mg/dL (ref 8.4–10.5)
GFR calc Af Amer: 49 mL/min
Glucose, Bld: 90 mg/dL (ref 70–99)
Sodium: 141 meq/L (ref 135–145)

## 2008-12-20 ENCOUNTER — Ambulatory Visit: Payer: Self-pay | Admitting: Cardiology

## 2008-12-22 ENCOUNTER — Encounter: Payer: Self-pay | Admitting: Pulmonary Disease

## 2008-12-24 HISTORY — PX: ESOPHAGOGASTRODUODENOSCOPY: SHX1529

## 2008-12-27 ENCOUNTER — Ambulatory Visit: Payer: Self-pay | Admitting: Pulmonary Disease

## 2009-01-03 ENCOUNTER — Ambulatory Visit: Payer: Self-pay | Admitting: Cardiology

## 2009-01-13 ENCOUNTER — Ambulatory Visit: Payer: Self-pay | Admitting: Cardiology

## 2009-01-13 ENCOUNTER — Inpatient Hospital Stay (HOSPITAL_COMMUNITY): Admission: AD | Admit: 2009-01-13 | Discharge: 2009-01-20 | Payer: Self-pay | Admitting: Cardiology

## 2009-01-31 ENCOUNTER — Ambulatory Visit: Payer: Self-pay | Admitting: Cardiology

## 2009-01-31 LAB — CONVERTED CEMR LAB
BUN: 70 mg/dL — ABNORMAL HIGH (ref 6–23)
Calcium: 9.1 mg/dL (ref 8.4–10.5)
Creatinine, Ser: 2.6 mg/dL — ABNORMAL HIGH (ref 0.4–1.5)
GFR calc Af Amer: 32 mL/min
Glucose, Bld: 423 mg/dL — ABNORMAL HIGH (ref 70–99)
Sodium: 132 meq/L — ABNORMAL LOW (ref 135–145)

## 2009-02-02 ENCOUNTER — Ambulatory Visit: Payer: Self-pay | Admitting: Cardiology

## 2009-02-02 DIAGNOSIS — I10 Essential (primary) hypertension: Secondary | ICD-10-CM | POA: Insufficient documentation

## 2009-02-02 DIAGNOSIS — I2510 Atherosclerotic heart disease of native coronary artery without angina pectoris: Secondary | ICD-10-CM | POA: Insufficient documentation

## 2009-02-02 DIAGNOSIS — I5032 Chronic diastolic (congestive) heart failure: Secondary | ICD-10-CM

## 2009-02-02 LAB — CONVERTED CEMR LAB
BUN: 70 mg/dL — ABNORMAL HIGH (ref 6–23)
BUN: 74 mg/dL — ABNORMAL HIGH (ref 6–23)
CO2: 36 meq/L — ABNORMAL HIGH (ref 19–32)
CO2: 37 meq/L — ABNORMAL HIGH (ref 19–32)
Chloride: 87 meq/L — ABNORMAL LOW (ref 96–112)
Chloride: 88 meq/L — ABNORMAL LOW (ref 96–112)
Creatinine, Ser: 2.6 mg/dL — ABNORMAL HIGH (ref 0.4–1.5)
Creatinine, Ser: 2.6 mg/dL — ABNORMAL HIGH (ref 0.4–1.5)
Glucose, Bld: 423 mg/dL — ABNORMAL HIGH (ref 70–99)

## 2009-02-03 ENCOUNTER — Ambulatory Visit: Payer: Self-pay | Admitting: Cardiology

## 2009-02-03 LAB — CONVERTED CEMR LAB
BUN: 79 mg/dL — ABNORMAL HIGH (ref 6–23)
CO2: 36 meq/L — ABNORMAL HIGH (ref 19–32)
Chloride: 82 meq/L — ABNORMAL LOW (ref 96–112)
Creatinine, Ser: 2.9 mg/dL — ABNORMAL HIGH (ref 0.4–1.5)
Potassium: 2.4 meq/L — CL (ref 3.5–5.1)

## 2009-02-06 ENCOUNTER — Inpatient Hospital Stay (HOSPITAL_COMMUNITY): Admission: EM | Admit: 2009-02-06 | Discharge: 2009-02-13 | Payer: Self-pay | Admitting: Emergency Medicine

## 2009-02-06 ENCOUNTER — Ambulatory Visit: Payer: Self-pay | Admitting: *Deleted

## 2009-02-07 ENCOUNTER — Ambulatory Visit: Payer: Self-pay | Admitting: Internal Medicine

## 2009-02-08 ENCOUNTER — Encounter: Payer: Self-pay | Admitting: Internal Medicine

## 2009-02-14 ENCOUNTER — Ambulatory Visit: Payer: Self-pay | Admitting: Cardiology

## 2009-02-14 LAB — CONVERTED CEMR LAB
CO2: 37 meq/L — ABNORMAL HIGH (ref 19–32)
Calcium: 9.4 mg/dL (ref 8.4–10.5)
Chloride: 91 meq/L — ABNORMAL LOW (ref 96–112)
Creatinine, Ser: 1.8 mg/dL — ABNORMAL HIGH (ref 0.4–1.5)
Glucose, Bld: 144 mg/dL — ABNORMAL HIGH (ref 70–99)
Sodium: 136 meq/L (ref 135–145)

## 2009-02-16 ENCOUNTER — Encounter: Payer: Self-pay | Admitting: Physician Assistant

## 2009-02-16 ENCOUNTER — Ambulatory Visit: Payer: Self-pay | Admitting: Cardiology

## 2009-02-16 DIAGNOSIS — K922 Gastrointestinal hemorrhage, unspecified: Secondary | ICD-10-CM | POA: Insufficient documentation

## 2009-02-21 ENCOUNTER — Ambulatory Visit: Payer: Self-pay | Admitting: Cardiology

## 2009-02-21 LAB — CONVERTED CEMR LAB
BUN: 23 mg/dL (ref 6–23)
Basophils Absolute: 0.1 10*3/uL (ref 0.0–0.1)
Basophils Relative: 1 % (ref 0.0–3.0)
Calcium: 9.5 mg/dL (ref 8.4–10.5)
Chloride: 97 meq/L (ref 96–112)
Creatinine, Ser: 1.7 mg/dL — ABNORMAL HIGH (ref 0.4–1.5)
Eosinophils Absolute: 0.3 10*3/uL (ref 0.0–0.7)
GFR calc Af Amer: 52 mL/min
GFR calc non Af Amer: 43 mL/min
HCT: 34.7 % — ABNORMAL LOW (ref 39.0–52.0)
MCHC: 34.5 g/dL (ref 30.0–36.0)
MCV: 92.6 fL (ref 78.0–100.0)
Monocytes Absolute: 0.8 10*3/uL (ref 0.1–1.0)
Neutro Abs: 5.9 10*3/uL (ref 1.4–7.7)
Neutrophils Relative %: 69.6 % (ref 43.0–77.0)
RBC: 3.74 M/uL — ABNORMAL LOW (ref 4.22–5.81)

## 2009-02-25 ENCOUNTER — Ambulatory Visit: Payer: Self-pay | Admitting: Internal Medicine

## 2009-02-25 LAB — CONVERTED CEMR LAB
Calcium: 10 mg/dL (ref 8.4–10.5)
Creatinine, Ser: 2.2 mg/dL — ABNORMAL HIGH (ref 0.4–1.5)
GFR calc Af Amer: 39 mL/min
GFR calc non Af Amer: 32 mL/min
Sodium: 138 meq/L (ref 135–145)

## 2009-02-28 ENCOUNTER — Ambulatory Visit: Payer: Self-pay | Admitting: Cardiology

## 2009-03-04 ENCOUNTER — Ambulatory Visit: Payer: Self-pay | Admitting: Internal Medicine

## 2009-03-04 LAB — CONVERTED CEMR LAB
BUN: 28 mg/dL — ABNORMAL HIGH (ref 6–23)
CO2: 32 meq/L (ref 19–32)
Chloride: 93 meq/L — ABNORMAL LOW (ref 96–112)
Glucose, Bld: 363 mg/dL — ABNORMAL HIGH (ref 70–99)
Potassium: 5.2 meq/L — ABNORMAL HIGH (ref 3.5–5.1)

## 2009-03-08 ENCOUNTER — Ambulatory Visit: Payer: Self-pay | Admitting: Endocrinology

## 2009-03-14 ENCOUNTER — Ambulatory Visit: Payer: Self-pay | Admitting: Cardiology

## 2009-03-14 ENCOUNTER — Ambulatory Visit: Payer: Self-pay | Admitting: Internal Medicine

## 2009-03-14 DIAGNOSIS — H6690 Otitis media, unspecified, unspecified ear: Secondary | ICD-10-CM | POA: Insufficient documentation

## 2009-03-14 LAB — CONVERTED CEMR LAB
BUN: 41 mg/dL — ABNORMAL HIGH (ref 6–23)
CO2: 33 meq/L — ABNORMAL HIGH (ref 19–32)
Chloride: 96 meq/L (ref 96–112)
Creatinine, Ser: 2.2 mg/dL — ABNORMAL HIGH (ref 0.4–1.5)
Glucose, Bld: 135 mg/dL — ABNORMAL HIGH (ref 70–99)

## 2009-03-28 ENCOUNTER — Telehealth (INDEPENDENT_AMBULATORY_CARE_PROVIDER_SITE_OTHER): Payer: Self-pay | Admitting: *Deleted

## 2009-03-31 ENCOUNTER — Ambulatory Visit: Payer: Self-pay | Admitting: Endocrinology

## 2009-04-04 ENCOUNTER — Telehealth (INDEPENDENT_AMBULATORY_CARE_PROVIDER_SITE_OTHER): Payer: Self-pay | Admitting: *Deleted

## 2009-04-06 ENCOUNTER — Encounter: Payer: Self-pay | Admitting: Cardiology

## 2009-04-06 ENCOUNTER — Ambulatory Visit: Payer: Self-pay | Admitting: Cardiology

## 2009-04-06 LAB — CONVERTED CEMR LAB
BUN: 46 mg/dL — ABNORMAL HIGH (ref 6–23)
Basophils Absolute: 0 10*3/uL (ref 0.0–0.1)
CO2: 32 meq/L (ref 19–32)
Calcium: 9.4 mg/dL (ref 8.4–10.5)
Eosinophils Absolute: 0.3 10*3/uL (ref 0.0–0.7)
GFR calc non Af Amer: 30.3 mL/min (ref >60)
Glucose, Bld: 90 mg/dL (ref 70–99)
HCT: 33.8 % — ABNORMAL LOW (ref 39.0–52.0)
Lymphocytes Relative: 10.7 % — ABNORMAL LOW (ref 12.0–46.0)
Lymphs Abs: 1.1 10*3/uL (ref 0.7–4.0)
MCHC: 35.2 g/dL (ref 30.0–36.0)
Monocytes Relative: 8.6 % (ref 3.0–12.0)
Platelets: 305 10*3/uL (ref 150.0–400.0)
RDW: 14.3 % (ref 11.5–14.6)

## 2009-04-08 ENCOUNTER — Telehealth (INDEPENDENT_AMBULATORY_CARE_PROVIDER_SITE_OTHER): Payer: Self-pay | Admitting: *Deleted

## 2009-04-26 ENCOUNTER — Ambulatory Visit: Payer: Self-pay | Admitting: Pulmonary Disease

## 2009-05-24 ENCOUNTER — Encounter: Payer: Self-pay | Admitting: *Deleted

## 2009-06-06 ENCOUNTER — Encounter: Payer: Self-pay | Admitting: Cardiology

## 2009-06-07 ENCOUNTER — Telehealth (INDEPENDENT_AMBULATORY_CARE_PROVIDER_SITE_OTHER): Payer: Self-pay | Admitting: *Deleted

## 2009-06-14 ENCOUNTER — Ambulatory Visit: Payer: Self-pay | Admitting: Endocrinology

## 2009-06-14 DIAGNOSIS — R05 Cough: Secondary | ICD-10-CM

## 2009-06-14 DIAGNOSIS — R059 Cough, unspecified: Secondary | ICD-10-CM | POA: Insufficient documentation

## 2009-06-22 ENCOUNTER — Encounter: Payer: Self-pay | Admitting: Endocrinology

## 2009-06-23 ENCOUNTER — Ambulatory Visit: Payer: Self-pay | Admitting: Endocrinology

## 2009-06-23 ENCOUNTER — Telehealth (INDEPENDENT_AMBULATORY_CARE_PROVIDER_SITE_OTHER): Payer: Self-pay | Admitting: *Deleted

## 2009-06-29 ENCOUNTER — Encounter: Payer: Self-pay | Admitting: *Deleted

## 2009-07-04 IMAGING — CT CT ABDOMEN W/O CM
2 of 4 series · 17 of 46 positions shown, 19 images · non-contrast
Comparison: 08/20/2006

CT ABDOMEN

CLINICAL DATA: Chest pain.  Decreased hemoglobin.

CT ABDOMEN AND PELVIS WITHOUT CONTRAST
TECHNIQUE: Multidetector CT imaging of the abdomen and pelvis was
performed following the standard protocol without intravenous
contrast.

[Series 2: abd/pelv w/o 5.0 b31f st · axial · non-contrast · 0.97mm/px · z∈[-488,-32]mm · 14 of 101 slices shown, 16 images]
[im 5/101  soft-tissue]
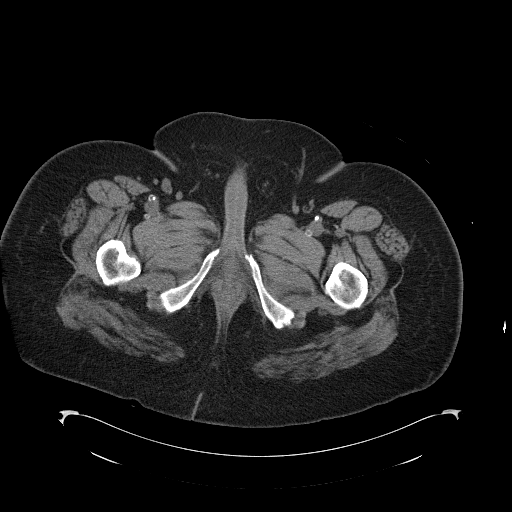
[im 5/101  bone]
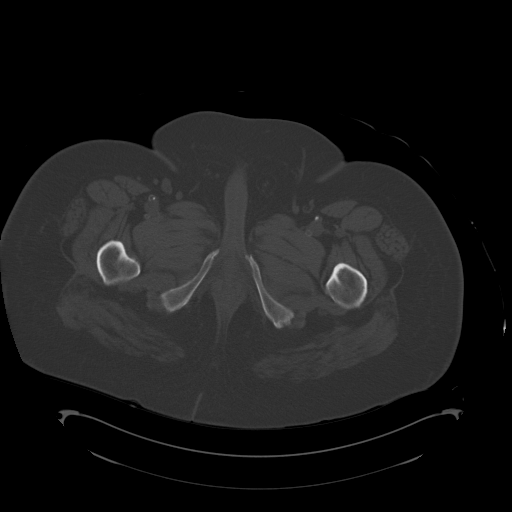
[im 14/101  soft-tissue]
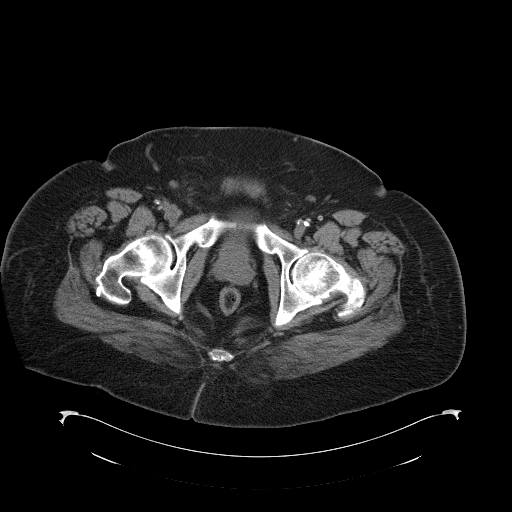
[im 18/101  soft-tissue]
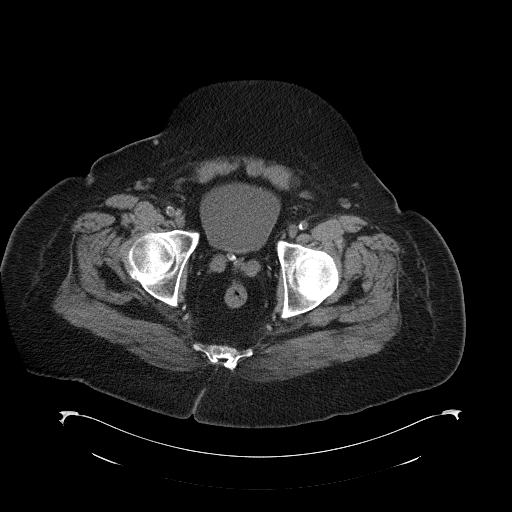
[im 27/101  soft-tissue]
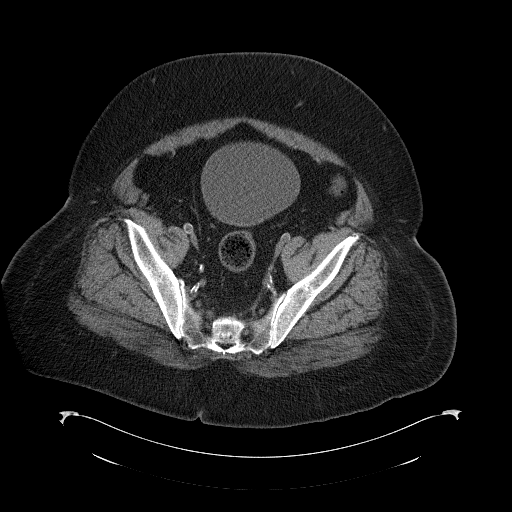
[im 35/101  soft-tissue]
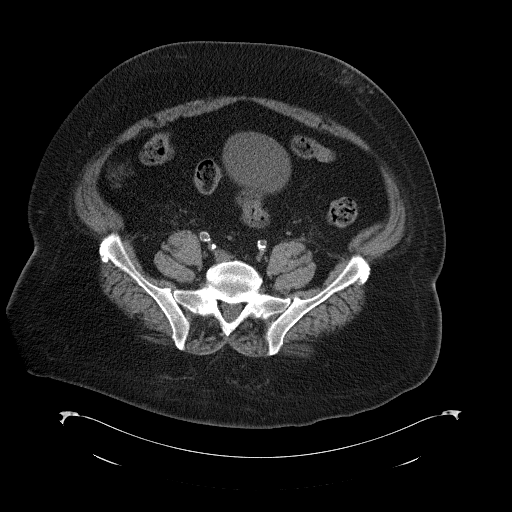
[im 40/101  soft-tissue]
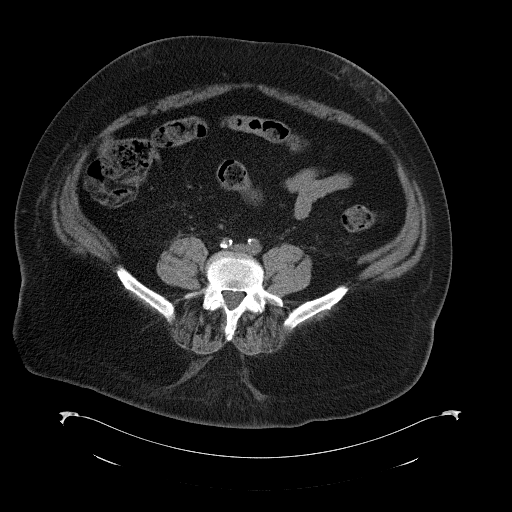
[im 48/101  soft-tissue]
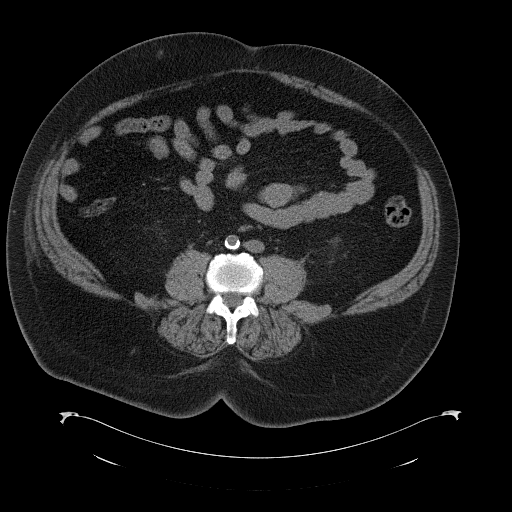
[im 53/101  soft-tissue]
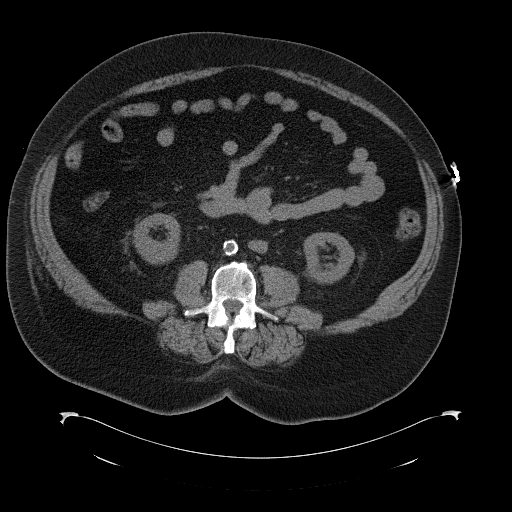
[im 61/101  soft-tissue]
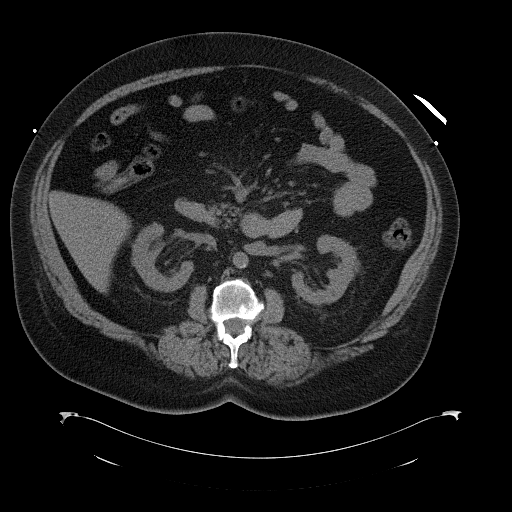
[im 61/101  bone]
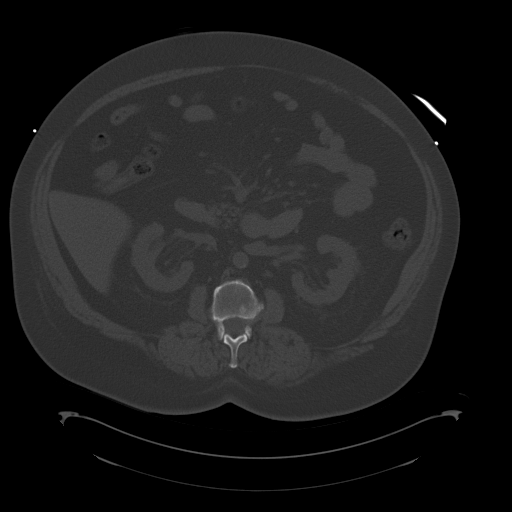
[im 66/101  soft-tissue]
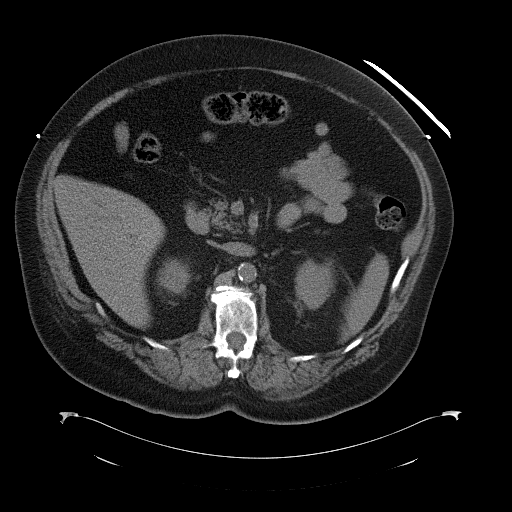
[im 74/101  soft-tissue]
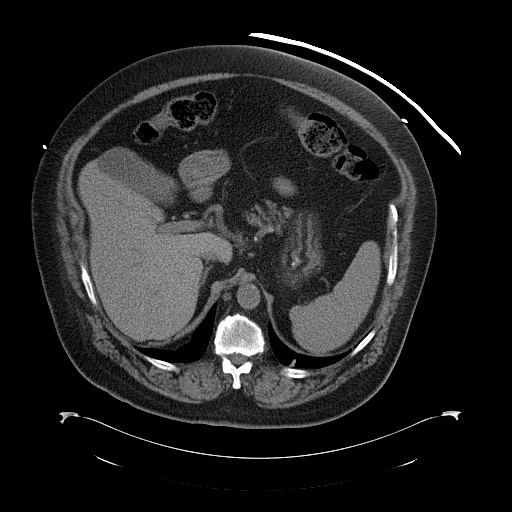
[im 83/101  soft-tissue]
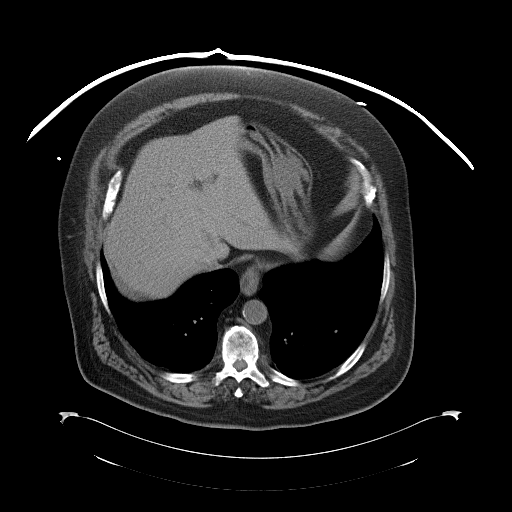
[im 87/101  soft-tissue]
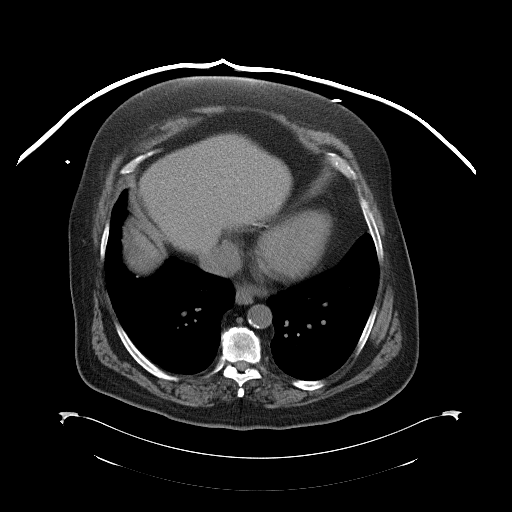
[im 96/101  soft-tissue]
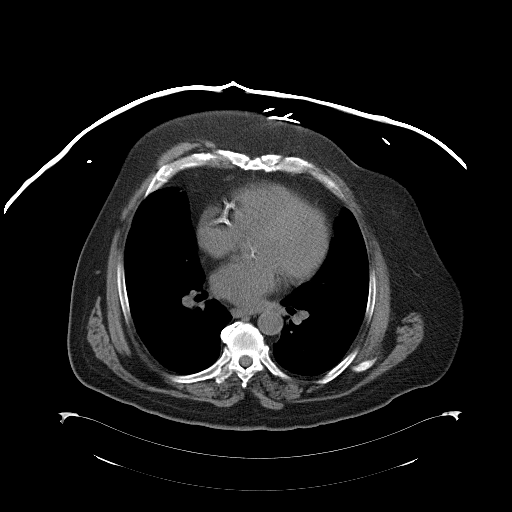

[Series 5: abd/pelv w/o 3.0 cor · coronal · non-contrast · 0.98mm/px · 3 of 105 slices shown]
[im 35/105  soft-tissue]
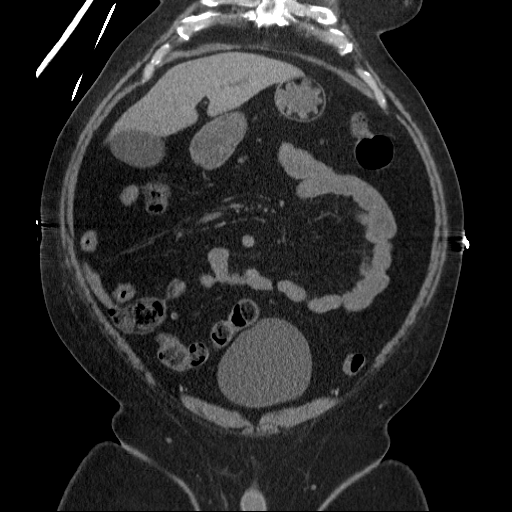
[im 47/105  soft-tissue]
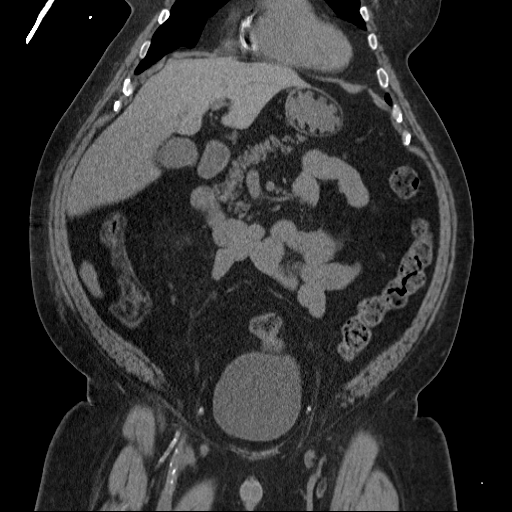
[im 58/105  soft-tissue]
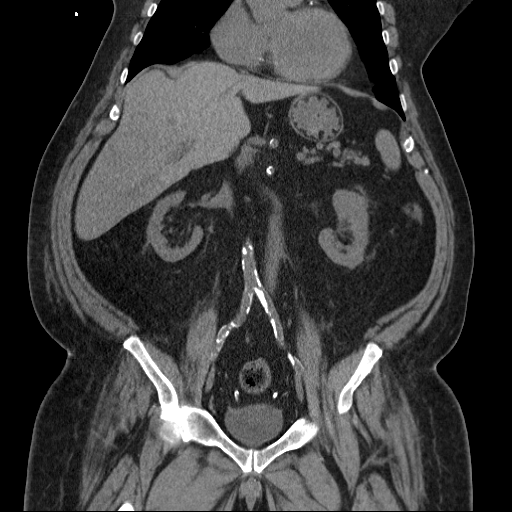

[17 of 46 positions shown; findings below may reference images not displayed]

FINDINGS: Coronary and aortic calcifications.  No aortic aneurysm.
Minimal linear scarring or atelectasis in the visualized lung
bases.  Unremarkable uninfused evaluation of liver, gallbladder,
spleen, adrenal glands, kidneys.  There is diffuse fatty
infiltration of the pancreas without focal lesion.  Small bowel
decompressed.  No free air.  No ascites.   Spondylitic changes in
the lower thoracic and lumbar spine.
IMPRESSION: 1.  No acute abdominal process.  No retroperitoneal hematoma.
2.  Coronary and aortic calcifications.

CT PELVIS
FINDINGS: Urinary bladder is distended.  Extensive iliofemoral
atheromatous calcifications.  The colon is nondilated,
unremarkable.  Normal appendix.  No free fluid.  Prominent
prostate. Early degenerative spurring noted in both hips.
IMPRESSION: 1.  No acute pelvic process.  No evidence of retroperitoneal or
extraperitoneal hematoma.
2.  Normal appendix

## 2009-07-05 ENCOUNTER — Ambulatory Visit: Payer: Self-pay | Admitting: Cardiology

## 2009-07-06 LAB — CONVERTED CEMR LAB
Basophils Relative: 0.8 % (ref 0.0–3.0)
CO2: 32 meq/L (ref 19–32)
Calcium: 9.5 mg/dL (ref 8.4–10.5)
Eosinophils Absolute: 0.4 10*3/uL (ref 0.0–0.7)
Eosinophils Relative: 4.7 % (ref 0.0–5.0)
Hemoglobin: 11.5 g/dL — ABNORMAL LOW (ref 13.0–17.0)
Lymphocytes Relative: 17.1 % (ref 12.0–46.0)
MCHC: 34.6 g/dL (ref 30.0–36.0)
Monocytes Relative: 7.8 % (ref 3.0–12.0)
Neutro Abs: 5.4 10*3/uL (ref 1.4–7.7)
Neutrophils Relative %: 69.6 % (ref 43.0–77.0)
RBC: 3.57 M/uL — ABNORMAL LOW (ref 4.22–5.81)
Sodium: 136 meq/L (ref 135–145)
WBC: 7.9 10*3/uL (ref 4.5–10.5)

## 2009-07-17 ENCOUNTER — Encounter: Payer: Self-pay | Admitting: Pulmonary Disease

## 2009-07-27 ENCOUNTER — Ambulatory Visit: Payer: Self-pay | Admitting: Endocrinology

## 2009-07-27 DIAGNOSIS — N401 Enlarged prostate with lower urinary tract symptoms: Secondary | ICD-10-CM | POA: Insufficient documentation

## 2009-07-27 DIAGNOSIS — N139 Obstructive and reflux uropathy, unspecified: Secondary | ICD-10-CM | POA: Insufficient documentation

## 2009-07-27 DIAGNOSIS — M79609 Pain in unspecified limb: Secondary | ICD-10-CM | POA: Insufficient documentation

## 2009-08-04 ENCOUNTER — Encounter: Payer: Self-pay | Admitting: Internal Medicine

## 2009-08-04 ENCOUNTER — Ambulatory Visit: Payer: Self-pay

## 2009-10-11 ENCOUNTER — Telehealth: Payer: Self-pay | Admitting: Endocrinology

## 2009-10-20 ENCOUNTER — Encounter: Payer: Self-pay | Admitting: Endocrinology

## 2009-10-20 ENCOUNTER — Telehealth: Payer: Self-pay | Admitting: Endocrinology

## 2009-10-23 ENCOUNTER — Encounter: Payer: Self-pay | Admitting: Endocrinology

## 2009-10-26 ENCOUNTER — Ambulatory Visit: Payer: Self-pay | Admitting: Pulmonary Disease

## 2009-10-27 ENCOUNTER — Encounter: Payer: Self-pay | Admitting: Endocrinology

## 2009-10-31 ENCOUNTER — Ambulatory Visit: Payer: Self-pay | Admitting: Endocrinology

## 2009-10-31 DIAGNOSIS — Z85828 Personal history of other malignant neoplasm of skin: Secondary | ICD-10-CM | POA: Insufficient documentation

## 2009-10-31 LAB — CONVERTED CEMR LAB
ALT: 18 units/L (ref 0–53)
BUN: 30 mg/dL — ABNORMAL HIGH (ref 6–23)
Basophils Relative: 0.5 % (ref 0.0–3.0)
Bilirubin Urine: NEGATIVE
Bilirubin, Direct: 0.1 mg/dL (ref 0.0–0.3)
Calcium: 9.6 mg/dL (ref 8.4–10.5)
Cholesterol: 112 mg/dL (ref 0–200)
Creatinine, Ser: 2 mg/dL — ABNORMAL HIGH (ref 0.4–1.5)
Creatinine,U: 54.1 mg/dL
Eosinophils Absolute: 0.3 10*3/uL (ref 0.0–0.7)
Eosinophils Relative: 3.5 % (ref 0.0–5.0)
GFR calc non Af Amer: 35.55 mL/min (ref >60)
HDL: 35.9 mg/dL — ABNORMAL LOW (ref >39.00)
Hemoglobin: 12.6 g/dL — ABNORMAL LOW (ref 13.0–17.0)
Hgb A1c MFr Bld: 7.5 % — ABNORMAL HIGH (ref 4.6–6.5)
Ketones, ur: NEGATIVE mg/dL
LDL Cholesterol: 49 mg/dL (ref 0–99)
Leukocytes, UA: NEGATIVE
MCHC: 34.6 g/dL (ref 30.0–36.0)
MCV: 89.8 fL (ref 78.0–100.0)
Microalb Creat Ratio: 20.3 mg/g (ref 0.0–30.0)
Monocytes Absolute: 0.7 10*3/uL (ref 0.1–1.0)
Neutro Abs: 6.3 10*3/uL (ref 1.4–7.7)
Neutrophils Relative %: 71.4 % (ref 43.0–77.0)
Nitrite: NEGATIVE
PSA: 0.47 ng/mL (ref 0.10–4.00)
RBC: 4.04 M/uL — ABNORMAL LOW (ref 4.22–5.81)
Total Bilirubin: 0.5 mg/dL (ref 0.3–1.2)
Total CHOL/HDL Ratio: 3
Triglycerides: 136 mg/dL (ref 0.0–149.0)
Urobilinogen, UA: 0.2 (ref 0.0–1.0)
VLDL: 27.2 mg/dL (ref 0.0–40.0)
WBC: 8.8 10*3/uL (ref 4.5–10.5)

## 2009-11-08 ENCOUNTER — Encounter: Payer: Self-pay | Admitting: Endocrinology

## 2009-12-13 ENCOUNTER — Telehealth: Payer: Self-pay | Admitting: Endocrinology

## 2009-12-26 ENCOUNTER — Ambulatory Visit: Payer: Self-pay | Admitting: Cardiology

## 2009-12-26 DIAGNOSIS — R072 Precordial pain: Secondary | ICD-10-CM | POA: Insufficient documentation

## 2009-12-26 LAB — CONVERTED CEMR LAB
BUN: 36 mg/dL — ABNORMAL HIGH (ref 6–23)
Basophils Absolute: 0.1 10*3/uL (ref 0.0–0.1)
CO2: 30 meq/L (ref 19–32)
Chloride: 96 meq/L (ref 96–112)
Creatinine, Ser: 2 mg/dL — ABNORMAL HIGH (ref 0.4–1.5)
Eosinophils Absolute: 0.3 10*3/uL (ref 0.0–0.7)
Eosinophils Relative: 3.3 % (ref 0.0–5.0)
Glucose, Bld: 101 mg/dL — ABNORMAL HIGH (ref 70–99)
HCT: 37.3 % — ABNORMAL LOW (ref 39.0–52.0)
Lymphs Abs: 1.5 10*3/uL (ref 0.7–4.0)
MCHC: 33.6 g/dL (ref 30.0–36.0)
MCV: 91.9 fL (ref 78.0–100.0)
Monocytes Absolute: 0.5 10*3/uL (ref 0.1–1.0)
Platelets: 288 10*3/uL (ref 150.0–400.0)
Potassium: 3.5 meq/L (ref 3.5–5.1)
RDW: 14.4 % (ref 11.5–14.6)

## 2010-01-18 ENCOUNTER — Ambulatory Visit: Payer: Self-pay | Admitting: Cardiology

## 2010-01-30 ENCOUNTER — Ambulatory Visit: Payer: Self-pay | Admitting: Endocrinology

## 2010-01-30 LAB — CONVERTED CEMR LAB
Eosinophils Relative: 4.4 % (ref 0.0–5.0)
Folate: >20 ng/mL
Iron: 64 ug/dL (ref 42–165)
Lymphocytes Relative: 18.8 % (ref 12.0–46.0)
MCV: 90.2 fL (ref 78.0–100.0)
Monocytes Absolute: 0.6 10*3/uL (ref 0.1–1.0)
Neutrophils Relative %: 70.1 % (ref 43.0–77.0)
Platelets: 285 10*3/uL (ref 150.0–400.0)
Transferrin: 217.5 mg/dL (ref 212.0–360.0)
WBC: 8.6 10*3/uL (ref 4.5–10.5)

## 2010-04-20 ENCOUNTER — Encounter: Payer: Self-pay | Admitting: Cardiology

## 2010-04-20 ENCOUNTER — Encounter: Payer: Self-pay | Admitting: Endocrinology

## 2010-04-28 ENCOUNTER — Ambulatory Visit: Payer: Self-pay | Admitting: Pulmonary Disease

## 2010-05-01 ENCOUNTER — Ambulatory Visit: Payer: Self-pay | Admitting: Endocrinology

## 2010-05-01 DIAGNOSIS — D509 Iron deficiency anemia, unspecified: Secondary | ICD-10-CM

## 2010-05-01 LAB — CONVERTED CEMR LAB
Basophils Absolute: 0 10*3/uL (ref 0.0–0.1)
Eosinophils Absolute: 0.3 10*3/uL (ref 0.0–0.7)
HCT: 34.3 % — ABNORMAL LOW (ref 39.0–52.0)
Hemoglobin: 11.7 g/dL — ABNORMAL LOW (ref 13.0–17.0)
Lymphs Abs: 1.6 10*3/uL (ref 0.7–4.0)
MCHC: 34.1 g/dL (ref 30.0–36.0)
Neutro Abs: 5.3 10*3/uL (ref 1.4–7.7)
RDW: 15.7 % — ABNORMAL HIGH (ref 11.5–14.6)

## 2010-05-05 ENCOUNTER — Encounter: Payer: Self-pay | Admitting: Pulmonary Disease

## 2010-05-08 ENCOUNTER — Telehealth: Payer: Self-pay | Admitting: Cardiology

## 2010-06-09 ENCOUNTER — Ambulatory Visit: Payer: Self-pay | Admitting: Cardiology

## 2010-07-14 ENCOUNTER — Encounter: Payer: Self-pay | Admitting: Endocrinology

## 2010-07-18 ENCOUNTER — Ambulatory Visit: Payer: Self-pay | Admitting: Internal Medicine

## 2010-07-18 ENCOUNTER — Encounter: Payer: Self-pay | Admitting: Endocrinology

## 2010-08-01 ENCOUNTER — Ambulatory Visit: Payer: Self-pay | Admitting: Endocrinology

## 2010-08-01 LAB — CONVERTED CEMR LAB
BUN: 29 mg/dL — ABNORMAL HIGH (ref 6–23)
Calcium, Total (PTH): 10.2 mg/dL (ref 8.4–10.5)
Chloride: 95 meq/L — ABNORMAL LOW (ref 96–112)
Glucose, Bld: 105 mg/dL — ABNORMAL HIGH (ref 70–99)
Potassium: 3.8 meq/L (ref 3.5–5.1)

## 2010-08-03 ENCOUNTER — Encounter: Payer: Self-pay | Admitting: Endocrinology

## 2010-08-05 ENCOUNTER — Encounter: Payer: Self-pay | Admitting: Pulmonary Disease

## 2010-09-15 ENCOUNTER — Encounter: Payer: Self-pay | Admitting: Endocrinology

## 2010-09-18 ENCOUNTER — Ambulatory Visit: Payer: Self-pay | Admitting: Endocrinology

## 2010-09-19 ENCOUNTER — Telehealth: Payer: Self-pay | Admitting: Endocrinology

## 2010-09-25 ENCOUNTER — Encounter
Admission: RE | Admit: 2010-09-25 | Discharge: 2010-09-25 | Payer: Self-pay | Source: Home / Self Care | Attending: Surgery | Admitting: Surgery

## 2010-10-03 ENCOUNTER — Ambulatory Visit (HOSPITAL_COMMUNITY)
Admission: RE | Admit: 2010-10-03 | Discharge: 2010-10-03 | Payer: Self-pay | Source: Home / Self Care | Admitting: Surgery

## 2010-10-09 ENCOUNTER — Ambulatory Visit (HOSPITAL_COMMUNITY): Admission: RE | Admit: 2010-10-09 | Discharge: 2010-10-09 | Payer: Self-pay | Admitting: Surgery

## 2010-10-19 ENCOUNTER — Ambulatory Visit: Payer: Self-pay | Admitting: Endocrinology

## 2010-10-25 ENCOUNTER — Ambulatory Visit: Payer: Self-pay | Admitting: Pulmonary Disease

## 2010-11-21 ENCOUNTER — Ambulatory Visit: Payer: Self-pay | Admitting: Endocrinology

## 2010-11-21 LAB — CONVERTED CEMR LAB: Hgb A1c MFr Bld: 7.3 % — ABNORMAL HIGH (ref 4.6–6.5)

## 2010-12-14 ENCOUNTER — Ambulatory Visit: Payer: Self-pay | Admitting: Cardiology

## 2010-12-14 ENCOUNTER — Encounter: Payer: Self-pay | Admitting: Cardiology

## 2010-12-15 LAB — CONVERTED CEMR LAB
Basophils Absolute: 0.1 10*3/uL (ref 0.0–0.1)
Basophils Relative: 0.5 % (ref 0.0–3.0)
CO2: 32 meq/L (ref 19–32)
Chloride: 93 meq/L — ABNORMAL LOW (ref 96–112)
Eosinophils Absolute: 0.3 10*3/uL (ref 0.0–0.7)
Glucose, Bld: 138 mg/dL — ABNORMAL HIGH (ref 70–99)
HCT: 37.2 % — ABNORMAL LOW (ref 39.0–52.0)
Hemoglobin: 12.5 g/dL — ABNORMAL LOW (ref 13.0–17.0)
Lymphs Abs: 2 10*3/uL (ref 0.7–4.0)
MCHC: 33.6 g/dL (ref 30.0–36.0)
Neutro Abs: 8.2 10*3/uL — ABNORMAL HIGH (ref 1.4–7.7)
Potassium: 3.4 meq/L — ABNORMAL LOW (ref 3.5–5.1)
RBC: 3.97 M/uL — ABNORMAL LOW (ref 4.22–5.81)
RDW: 15.8 % — ABNORMAL HIGH (ref 11.5–14.6)
Sodium: 136 meq/L (ref 135–145)

## 2010-12-27 ENCOUNTER — Encounter: Payer: Self-pay | Admitting: Endocrinology

## 2010-12-27 ENCOUNTER — Ambulatory Visit
Admission: RE | Admit: 2010-12-27 | Discharge: 2010-12-27 | Payer: Self-pay | Source: Home / Self Care | Attending: Endocrinology | Admitting: Endocrinology

## 2011-01-01 ENCOUNTER — Other Ambulatory Visit: Payer: Self-pay | Admitting: Cardiology

## 2011-01-01 ENCOUNTER — Ambulatory Visit
Admission: RE | Admit: 2011-01-01 | Discharge: 2011-01-01 | Payer: Self-pay | Source: Home / Self Care | Attending: Cardiology | Admitting: Cardiology

## 2011-01-01 LAB — BASIC METABOLIC PANEL
BUN: 37 mg/dL — ABNORMAL HIGH (ref 6–23)
CO2: 29 mEq/L (ref 19–32)
Calcium: 9.3 mg/dL (ref 8.4–10.5)
Chloride: 98 mEq/L (ref 96–112)
Creatinine, Ser: 2 mg/dL — ABNORMAL HIGH (ref 0.4–1.5)
GFR: 36.04 mL/min — ABNORMAL LOW (ref >60.00)
Glucose, Bld: 131 mg/dL — ABNORMAL HIGH (ref 70–99)
Potassium: 3.3 mEq/L — ABNORMAL LOW (ref 3.5–5.1)
Sodium: 141 mEq/L (ref 135–145)

## 2011-01-08 ENCOUNTER — Other Ambulatory Visit: Payer: Self-pay | Admitting: Cardiovascular Disease

## 2011-01-08 ENCOUNTER — Ambulatory Visit
Admission: RE | Admit: 2011-01-08 | Discharge: 2011-01-08 | Payer: Self-pay | Source: Home / Self Care | Attending: Cardiovascular Disease | Admitting: Cardiovascular Disease

## 2011-01-08 LAB — BASIC METABOLIC PANEL
BUN: 30 mg/dL — ABNORMAL HIGH (ref 6–23)
CO2: 32 mEq/L (ref 19–32)
Calcium: 9.1 mg/dL (ref 8.4–10.5)
Chloride: 93 mEq/L — ABNORMAL LOW (ref 96–112)
Creatinine, Ser: 2 mg/dL — ABNORMAL HIGH (ref 0.4–1.5)
GFR: 34.82 mL/min — ABNORMAL LOW (ref >60.00)
Glucose, Bld: 48 mg/dL — CL (ref 70–99)
Potassium: 3.7 mEq/L (ref 3.5–5.1)
Sodium: 135 mEq/L (ref 135–145)

## 2011-01-14 ENCOUNTER — Encounter: Payer: Self-pay | Admitting: Endocrinology

## 2011-01-16 ENCOUNTER — Other Ambulatory Visit: Payer: Self-pay | Admitting: Dermatology

## 2011-01-18 ENCOUNTER — Other Ambulatory Visit: Payer: Self-pay | Admitting: Endocrinology

## 2011-01-18 ENCOUNTER — Ambulatory Visit
Admission: RE | Admit: 2011-01-18 | Discharge: 2011-01-18 | Payer: Self-pay | Source: Home / Self Care | Attending: Endocrinology | Admitting: Endocrinology

## 2011-01-18 DIAGNOSIS — R109 Unspecified abdominal pain: Secondary | ICD-10-CM | POA: Insufficient documentation

## 2011-01-18 LAB — URINALYSIS, ROUTINE W REFLEX MICROSCOPIC
Bilirubin Urine: NEGATIVE
Ketones, ur: NEGATIVE
Leukocytes, UA: NEGATIVE
Specific Gravity, Urine: 1.01 (ref 1.000–1.030)
Total Protein, Urine: NEGATIVE
Urine Glucose: NEGATIVE
pH: 6 (ref 5.0–8.0)

## 2011-01-18 LAB — CBC WITH DIFFERENTIAL/PLATELET
Basophils Absolute: 0 10*3/uL (ref 0.0–0.1)
Basophils Relative: 0.3 % (ref 0.0–3.0)
HCT: 37.8 % — ABNORMAL LOW (ref 39.0–52.0)
Hemoglobin: 12.6 g/dL — ABNORMAL LOW (ref 13.0–17.0)
Lymphocytes Relative: 15 % (ref 12.0–46.0)
Lymphs Abs: 1.8 10*3/uL (ref 0.7–4.0)
Monocytes Relative: 7.3 % (ref 3.0–12.0)
Neutro Abs: 8.7 10*3/uL — ABNORMAL HIGH (ref 1.4–7.7)
RBC: 3.99 Mil/uL — ABNORMAL LOW (ref 4.22–5.81)
RDW: 16.2 % — ABNORMAL HIGH (ref 11.5–14.6)

## 2011-01-18 LAB — BASIC METABOLIC PANEL
BUN: 37 mg/dL — ABNORMAL HIGH (ref 6–23)
CO2: 32 mEq/L (ref 19–32)
Chloride: 94 mEq/L — ABNORMAL LOW (ref 96–112)
GFR: 36.9 mL/min — ABNORMAL LOW (ref >60.00)
Glucose, Bld: 216 mg/dL — ABNORMAL HIGH (ref 70–99)
Potassium: 4.1 mEq/L (ref 3.5–5.1)
Sodium: 136 mEq/L (ref 135–145)

## 2011-01-19 ENCOUNTER — Telehealth: Payer: Self-pay | Admitting: Endocrinology

## 2011-01-19 ENCOUNTER — Encounter
Admission: RE | Admit: 2011-01-19 | Discharge: 2011-01-19 | Payer: Self-pay | Source: Home / Self Care | Attending: Endocrinology | Admitting: Endocrinology

## 2011-01-23 NOTE — Letter (Signed)
Summary: Weight Loss Form/[facility]  [employer] Loss Form/ [facility]Care   Imported By: Sherian Rein 11/24/2010 08:35:46  _____________________________________________________________________  External Attachment:    Type:   Image     Comment:   External Document

## 2011-01-23 NOTE — Assessment & Plan Note (Signed)
Summary: follow up/gastric bypass candidate/lb   Vital Signs:  Patient profile:   70 year old male Height:      63 inches (160.02 cm) Weight:      240.50 pounds (109.32 kg) BMI:     42.76 O2 Sat:      94 % on Room air Temp:     98.3 degrees F (36.83 degrees C) oral Pulse rate:   67 / minute BP sitting:   118 / 64  (left arm) Cuff size:   large  Vitals Entered By: Brenton Grills MA (September 18, 2010 1:43 PM)  O2 Flow:  Room air CC: F/U to discuss weight loss program/pt is no longer takng Metoclopramide/aj Is Patient Diabetic? Yes   Referring Provider:  Romero Belling Primary Provider:  Minus Breeding MD  CC:  F/U to discuss weight loss program/pt is no longer takng Metoclopramide/aj.  History of Present Illness: the status of at least 3 ongoing medical problems is addressed today: dm:  no cbg record, but states cbg's are highest at hs, and lowest in am.   secondary hyperparathyroidism:  he denies muscle weakness. dyslipidemia:  denies weight loss.  Current Medications (verified): 1)  Metoclopramide Hcl 10 Mg  Tabs (Metoclopramide Hcl) .... Take 1 By Mouth Qid 2)  Omeprazole 20 Mg  Tbec (Omeprazole) .... Take 1 By Mouth Qd 3)  Taztia Xt 240 Mg Xr24h-Cap (Diltiazem Hcl Er Beads) .... Take One Tablet By Mouth Daily 4)  Bisoprolol Fumarate 10 Mg Tabs (Bisoprolol Fumarate) .... One Tab By Mouth Once Daily 5)  Calcitriol 0.5 Mcg  Caps (Calcitriol) .... Take 1 By Mouth Qd 6)  Folbic 2.5-25-2 Mg  Tabs (Fa-Pyridoxine-Cyancobalamin) .... Take 1 By Mouth Qd 7)  Isosorbide Mononitrate Cr 60 Mg Xr24h-Tab (Isosorbide Mononitrate) .... One Tab By Mouth Once Daily 8)  Hydrocodone-Acetaminophen 5-500 Mg  Tabs (Hydrocodone-Acetaminophen) .... Take 1-2 Q 4 Hours Prn 9)  Trazodone Hcl 100 Mg  Tabs (Trazodone Hcl) .... Qhs 10)  Accu-Chek Compact Test Drum  Strp (Glucose Blood) .... Qid, Variable Glucoses, and Lancets 250.03 11)  Klor-Con M20 20 Meq  Tbcr (Potassium Chloride Crys Cr) .... Take 2  By Mouth Two Times A Day 12)  Combivent 103-18 Mcg/act  Aero (Ipratropium-Albuterol) .... Take 2 Puffs Qid Prn 13)  Bd Insulin Syringe 29g X 1/2" 1 Ml  Misc (Insulin Syringe-Needle U-100) .... Qid 14)  Vytorin 10-80 Mg  Tabs (Ezetimibe-Simvastatin) .Marland Kitchen.. 1 Qhs 15)  Wheelchair .Marland Kitchen.. 428.0 16)  Shower Chair .... 715.90 17)  Triamcinolone Acetonide 0.025 % Crea (Triamcinolone Acetonide) .... Apply Three Times A Day On Affected Area As Needed 18)  Furosemide 80 Mg Tabs (Furosemide) .... Take 1 Q Am and 1/2 Pm 19)  Nebulizer 20)  Symbicort 160-4.5 Mcg/act  Aero (Budesonide-Formoterol Fumarate) .... Two Puffs Twice Daily 21)  Spiriva Handihaler 18 Mcg Caps (Tiotropium Bromide Monohydrate) .... Inhale Contents of 1 Capsule Once A Day 22)  Spironolactone 25 Mg Tabs (Spironolactone) .... Take 1 By Mouth Qd 23)  Adult Aspirin Low Strength 81 Mg Tbdp (Aspirin) .... Take 1 By Mouth Qd 24)  Novolog 100 Unit/ml Soln (Insulin Aspart) .... 4 Times A Day (Qac) 50-30-65-10units 25)  Novolin N 100 Unit/ml Susp (Insulin Isophane Human) .... 70 Units Qhs 26)  Nitrolingual 0.4 Mg/spray Soln (Nitroglycerin) .... As Directed As Needed 27)  Ferrous Sulfate 325 (65 Fe) Mg Tbec (Ferrous Sulfate) .... Take 1 Three Times A Day 28)  Astepro 0.15 % Soln (Azelastine Hcl) .... 2  Sprays Each Nostril Each Am 29)  Flomax 0.4 Mg Xr24h-Cap (Tamsulosin Hcl) .Marland Kitchen.. 1 Tab At Bedtime 30)  02 .... 2 Liters Qhs 31)  Robitussin Dm 100-10 Mg/56ml Syrp (Dextromethorphan-Guaifenesin) .... As Directed 32)  Zaroxolyn 2.5 Mg Tabs (Metolazone) .Marland Kitchen.. 1 Tab As Directed 62)  Vitamin D 1000 Unit Tabs (Cholecalciferol) .... Take 1 Tablet By Mouth Once A Day 34)  Allopurinol 300 Mg Tabs (Allopurinol) .Marland Kitchen.. 1 Once Daily  Allergies (verified): 1)  ! 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 Hyperlipidemia  DM  Neuropathy impotence Idiopathic Peripheral vascular disease Renal insufficiency Osteoarthritis CVA Cough due to Zestril  Current Problems:  LONG TERM USE OF ASPIRIN (ICD-V58.66) LONG TERM ANTIPLATELET THERAPY (ICD-V58.63) HYPERTENSION, BENIGN (ICD-401.1) DIASTOLIC HEART FAILURE, CHRONIC (ICD-428.32) CAD, NATIVE VESSEL (ICD-414.01) DYSPNEA (ICD-786.05) EMPHYSEMA (ICD-492.8) EDEMA (ICD-782.3) COUGH (ICD-786.2) SPECIAL SCREENING MALIGNANT NEOPLASM OF PROSTATE (ICD-V76.44) CONJUNCTIVITIS, RIGHT (ICD-372.30) HYPOKALEMIA (ICD-276.8) CHF (ICD-428.0) OSTEOPOROSIS (ICD-733.00) HYPERTENSION (ICD-401.9) 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    Physical Exam  General:  morbidly obese.  no distress  Extremities:  trace right pedal edema and trace left pedal edema.   Additional Exam:  Parathyroid Hormone       14.8 pg/mL                  14.0-72.0 Calcium                   10.2 mg/dL           Impression & Recommendations:  Problem # 1:  DIABETES MELLITUS, TYPE I (ICD-250.01) HgbA1C: 6.9 (08/01/2010) well-controlled.  i can reduce insulin to assist him with weight loss.  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 55 units qhs 2)  Taztia Xt 180 Mg Xr24h-cap (Diltiazem hcl er beads) .Marland Kitchen.. 1 tab once daily 3)  Calcitriol 0.25 Mcg Caps (Calcitriol) .Marland Kitchen.. 1 tab once daily  Other Orders: Est. Patient Level IV (16109)  Patient Instructions: 1)  reduce nph insulin to 55 units at bedtime. 2)  same humalog.  3)  reduce diltiazem to 180 mg once daily 4)  reduce calcitriol to 0.25 micrograms/day 5)  Please schedule a follow-up appointment in 1 month. 6)  i did bariatric surgery form for today's visit Prescriptions: CALCITRIOL 0.25 MCG CAPS (CALCITRIOL) 1 tab once daily  #90 x 3   Entered and Authorized by:   Minus Breeding MD   Signed by:   Minus Breeding MD on 09/18/2010   Method used:   Electronically to        [facility] [address] #[id number]* (retail)       [address] [place], [place]  [address]       Ph: [phone]  Fax: [phone]   RxID:   [id number] TAZTIA XT 180 MG XR24H-CAP (DILTIAZEM HCL ER BEADS) 1 tab once daily  #90 x 3   Entered and Authorized by:   Minus Breeding MD   Signed by:   Minus Breeding MD on 09/18/2010   Method used:   Electronically to        [facility] [address] #[id number]* (retail)       [address] [place], [place]  [address]       Ph: [phone]       Fax: [phone]   RxID:   [id number]

## 2011-01-23 NOTE — Assessment & Plan Note (Signed)
Summary: 3 MTH FU PER PT  STC   Vital Signs:  Patient profile:   70 year old male Height:      63 inches (160.02 cm) Weight:      239.13 pounds (108.70 kg) O2 Sat:      94 % on Room air Temp:     97 degrees F (36.11 degrees C) oral Pulse rate:   74 / minute BP sitting:   112 / 60  (left arm) Cuff size:   large  Vitals Entered By: Josph Macho CMA (January 30, 2010 11:03 AM)  O2 Flow:  Room air CC: 3 month follow up/ CF Is Patient Diabetic? Yes   Referring Provider:  Romero Belling Primary Provider:  Minus Breeding MD  CC:  3 month follow up/ CF.  History of Present Illness: no cbg record, but states cbg's are sometimes low at hs. it is also highest at hs.   anemia has been noted few times.  his chronic cough persists.    Current Medications (verified): 1)  Metoclopramide Hcl 10 Mg  Tabs (Metoclopramide Hcl) .... Take 1 By Mouth Qid 2)  Omeprazole 20 Mg  Tbec (Omeprazole) .... Take 1 By Mouth Qd 3)  Taztia Xt 300 Mg  Cp24 (Diltiazem Hcl Er Beads) .... Take 1 By Mouth Qd 4)  Bisoprolol Fumarate 10 Mg Tabs (Bisoprolol Fumarate) .... One Tab By Mouth Once Daily 5)  Calcitriol 0.5 Mcg  Caps (Calcitriol) .... Take 1 By Mouth Qd 6)  Folbic 2.5-25-2 Mg  Tabs (Fa-Pyridoxine-Cyancobalamin) .... Take 1 By Mouth Qd 7)  Isosorbide Mononitrate Cr 60 Mg Xr24h-Tab (Isosorbide Mononitrate) .... One Tab By Mouth Once Daily 8)  Hydrocodone-Acetaminophen 5-500 Mg  Tabs (Hydrocodone-Acetaminophen) .... Take 1-2 Q 4 Hours Prn 9)  Trazodone Hcl 100 Mg  Tabs (Trazodone Hcl) .... Qhs 10)  Accu-Chek Compact Test Drum  Strp (Glucose Blood) .... Qid, Variable Glucoses, and Lancets 250.03 11)  Allopurinol 100 Mg Tabs (Allopurinol) .... Take 1 Tablet By Mouth Once A Day 12)  Klor-Con M20 20 Meq  Tbcr (Potassium Chloride Crys Cr) .... Take 2 By Mouth Two Times A Day 13)  Combivent 103-18 Mcg/act  Aero (Ipratropium-Albuterol) .... Take 2 Puffs Qid Prn 14)  Bd Insulin Syringe 29g X 1/2" 1 Ml  Misc  (Insulin Syringe-Needle U-100) .... Qid 15)  Vytorin 10-80 Mg  Tabs (Ezetimibe-Simvastatin) .Marland Kitchen.. 1 Qhs 16)  Wheelchair .Marland Kitchen.. 428.0 17)  Shower Chair .... 715.90 18)  Triamcinolone Acetonide 0.025 % Crea (Triamcinolone Acetonide) .... Apply Three Times A Day On Affected Area As Needed 19)  Furosemide 80 Mg Tabs (Furosemide) .... Take 1 Q Am and 1/2 Pm 20)  Nebulizer 21)  Symbicort 160-4.5 Mcg/act  Aero (Budesonide-Formoterol Fumarate) .... Two Puffs Twice Daily 22)  Spiriva Handihaler 18 Mcg Caps (Tiotropium Bromide Monohydrate) .... Inhale Contents of 1 Capsule Once A Day 23)  Spironolactone 25 Mg Tabs (Spironolactone) .... Take 1 By Mouth Qd 24)  Metalazone 2.5mg  .... Take 1 Q Mon,wed and Fri 25)  Adult Aspirin Low Strength 81 Mg Tbdp (Aspirin) .... Take 1 By Mouth Qd 26)  Novolog 100 Unit/ml Soln (Insulin Aspart) .... 4 Times A Day (Qac) 50-30-65-10units 27)  Novolin N 100 Unit/ml Susp (Insulin Isophane Human) .... 70 Units Qhs 28)  Nitrolingual 0.4 Mg/spray Soln (Nitroglycerin) .... As Directed As Needed 29)  Ferrous Sulfate 325 (65 Fe) Mg Tbec (Ferrous Sulfate) .... Take 1 Three Times A Day 30)  Astepro 0.15 % Soln (Azelastine  Hcl) .... 2 Sprays Each Nostril Each Am 31)  Flomax 0.4 Mg Xr24h-Cap (Tamsulosin Hcl) .Marland Kitchen.. 1 Tab At Bedtime 32)  02 .... 2 Liters Qhs 33)  Robitussin Dm 100-10 Mg/29ml Syrp (Dextromethorphan-Guaifenesin) .... As Directed  Allergies (verified): 1)  ! * Shellfish 2)  Enalapril Maleate  Review of Systems  The patient denies syncope, dyspnea on exertion, hematochezia, and hematuria.    Physical Exam  General:  obese.  no distress.  in wheelchair. Lungs:  Clear to auscultation bilaterally. Normal respiratory effort.  Additional Exam:  Hemoglobin           [L]  12.1 g/dL                   02.7-25.3   Hematocrit           [L]  36.3 %    Iron Saturation           21.0 %                      20.0-50.0  Hemoglobin A1C       [H]  7.1 %   Impression &  Recommendations:  Problem # 1:  DIABETES MELLITUS, TYPE I (ICD-250.01) needs increased rx  Problem # 2:  ANEMIA (ICD-285.9) Assessment: Unchanged  Problem # 3:  COUGH (ICD-786.2) persistent  Other Orders: TLB-CBC Platelet - w/Differential (85025-CBCD) TLB-IBC Pnl (Iron/FE;Transferrin) (83550-IBC) TLB-B12 + Folate Pnl (82746_82607-B12/FOL) TLB-A1C / Hgb A1C (Glycohemoglobin) (83036-A1C) Est. Patient Level IV (66440)  Patient Instructions: 1)  pending the test results: 2)  take the novolog as noted below, except at the evening meal, take novolog anywhere from 50 to 70 units, depending on the size of the meal.   3)  tests are being ordered for you today.  a few days after the test(s), please call [phone] to hear your test results. 4)  Please schedule a follow-up appointment in 3 months. 5)  loratadine-d as needed for congestion.  please see if this helps your cough. 6)  (update: i left message on phone-tree:  rx as we discussed.  we'll follow the anemia)

## 2011-01-23 NOTE — Letter (Signed)
Summary: CMN  CMN   Imported By: Lehman Prom 05/05/2010 16:45:30  _____________________________________________________________________  External Attachment:    Type:   Image     Comment:   External Document

## 2011-01-23 NOTE — Assessment & Plan Note (Signed)
Summary: f/u appt/#/cd   Vital Signs:  Patient profile:   70 year old male Height:      63 inches (160.02 cm) Weight:      239.50 pounds (108.86 kg) BMI:     42.58 O2 Sat:      93 % on Room air Temp:     96.7 degrees F (35.94 degrees C) oral Pulse rate:   72 / minute Pulse rhythm:   regular BP sitting:   128 / 60  (left arm) Cuff size:   large  Vitals Entered By: Brenton Grills CMA Duncan Dull) (November 21, 2010 11:08 AM)  O2 Flow:  Room air CC: Follow-up visit/aj Is Patient Diabetic? Yes   Referring Provider:  Romero Belling Primary Provider:  Minus Breeding MD  CC:  Follow-up visit/aj.  History of Present Illness: no cbg record, but states cbg's are well-controlled.  he had 1 episode of cbg of 55, at 5 am, after he took 10 extra units of nph insulin after thanksgiving meal.  pt states he feels well in general.  Current Medications (verified): 1)  Omeprazole 20 Mg  Tbec (Omeprazole) .... Take 1 By Mouth Qd 2)  Bisoprolol Fumarate 10 Mg Tabs (Bisoprolol Fumarate) .... One Tab By Mouth Once Daily 3)  Folbic 2.5-25-2 Mg  Tabs (Fa-Pyridoxine-Cyancobalamin) .... Take 1 By Mouth Qd 4)  Isosorbide Mononitrate Cr 60 Mg Xr24h-Tab (Isosorbide Mononitrate) .... One Tab By Mouth Once Daily 5)  Hydrocodone-Acetaminophen 5-500 Mg  Tabs (Hydrocodone-Acetaminophen) .... Take 1-2 Q 4 Hours Prn 6)  Trazodone Hcl 100 Mg  Tabs (Trazodone Hcl) .... Qhs 7)  Accu-Chek Compact Test Drum  Strp (Glucose Blood) .... Qid, Variable Glucoses, and Lancets 250.03 8)  Klor-Con M20 20 Meq  Tbcr (Potassium Chloride Crys Cr) .... Take 2 By Mouth Two Times A Day 9)  Combivent 103-18 Mcg/act  Aero (Ipratropium-Albuterol) .... Take 2 Puffs Qid Prn 10)  Bd Insulin Syringe 29g X 1/2" 1 Ml  Misc (Insulin Syringe-Needle U-100) .... Qid 11)  Triamcinolone Acetonide 0.025 % Crea (Triamcinolone Acetonide) .... Apply Three Times A Day On Affected Area As Needed 12)  Furosemide 80 Mg Tabs (Furosemide) .... Take 1 Q Am and 1/2  Pm 13)  Nebulizer 14)  Symbicort 160-4.5 Mcg/act  Aero (Budesonide-Formoterol Fumarate) .... Two Puffs Twice Daily As Needed 15)  Spiriva Handihaler 18 Mcg Caps (Tiotropium Bromide Monohydrate) .... Inhale Contents of 1 Capsule Once A Day As Needed 16)  Spironolactone 25 Mg Tabs (Spironolactone) .... Take 1 By Mouth Qd 17)  Adult Aspirin Low Strength 81 Mg Tbdp (Aspirin) .... Take 1 By Mouth Qd 18)  Novolog 100 Unit/ml Soln (Insulin Aspart) .... 4 Times A Day (Qac) 50-30-65-10units 19)  Novolin N 100 Unit/ml Susp (Insulin Isophane Human) .... 55 Units Qhs 20)  Nitrolingual 0.4 Mg/spray Soln (Nitroglycerin) .... As Directed As Needed 21)  Ferrous Sulfate 325 (65 Fe) Mg Tbec (Ferrous Sulfate) .... Take 1 Three Times A Day 22)  Astepro 0.15 % Soln (Azelastine Hcl) .... 2 Sprays Each Nostril Each Am As Needed 23)  Flomax 0.4 Mg Xr24h-Cap (Tamsulosin Hcl) .Marland Kitchen.. 1 Tab At Bedtime 24)  02 .... 2 Liters Qhs 25)  Robitussin Dm 100-10 Mg/24ml Syrp (Dextromethorphan-Guaifenesin) .... As Directed 26)  Zaroxolyn 2.5 Mg Tabs (Metolazone) .Marland Kitchen.. 1 Tab As Directed 27)  Allopurinol 300 Mg Tabs (Allopurinol) .Marland Kitchen.. 1 Once Daily 28)  Taztia Xt 180 Mg Xr24h-Cap (Diltiazem Hcl Er Beads) .Marland Kitchen.. 1 Tab Once Daily 29)  Calcitriol 0.25 Mcg  Caps (Calcitriol) .Marland Kitchen.. 1 Tab Once Daily 30)  Crestor 10 Mg Tabs (Rosuvastatin Calcium) .Marland Kitchen.. 1 Tab Once Daily  Allergies (verified): 1)  ! 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 Hyperlipidemia  DM Neuropathy impotence Idiopathic Peripheral vascular disease Renal insufficiency Osteoarthritis CVA Cough due to Zestril  Current Problems:  LONG TERM USE OF ASPIRIN (ICD-V58.66) LONG TERM ANTIPLATELET THERAPY (ICD-V58.63) HYPERTENSION, BENIGN (ICD-401.1) DIASTOLIC HEART FAILURE, CHRONIC (ICD-428.32) CAD, NATIVE VESSEL (ICD-414.01) DYSPNEA (ICD-786.05) EMPHYSEMA  (ICD-492.8) EDEMA (ICD-782.3) COUGH (ICD-786.2) SPECIAL SCREENING MALIGNANT NEOPLASM OF PROSTATE (ICD-V76.44) CONJUNCTIVITIS, RIGHT (ICD-372.30) HYPOKALEMIA (ICD-276.8) CHF (ICD-428.0) OSTEOPOROSIS (ICD-733.00) HYPERTENSION (ICD-401.9) 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    Physical Exam  General:  morbidly obese.  no distress  Pulses:  dorsalis pedis intact bilat Extremities:  no deformity.  no ulcer on the feet.  feet are of normal color and temp.   trace right pedal edema and trace left pedal edema.   Neurologic:  cn 2-12 grossly intact.   readily moves all 4's.   sensation is intact to touch on the feet  Additional Exam:  Hemoglobin A1C       [H]  7.3 %    Impression & Recommendations:  Problem # 1:  DIABETES MELLITUS, TYPE I (ICD-250.01) needs increased rx  Medications Added to Medication List This Visit: 1)  Novolog 100 Unit/ml Soln (Insulin aspart) .... 4 times a day (qac) 50-30-65-10units  Other Orders: TLB-A1C / Hgb A1C (Glycohemoglobin) (83036-A1C) Est. Patient Level III (98119)  Patient Instructions: 1)  continue reduced dietary portions. 2)  continue to walk 3x a week. 3)  Please schedule a follow-up appointment in 5 weeks. 4)  blood tests are being ordered for you today.  please call [phone] to hear your test results. 5)  pending the test results, please continue the same medications for now.   6)  (update: i left message on phone-tree:  rx as we  discussed).   Orders Added: 1)  TLB-A1C / Hgb A1C (Glycohemoglobin) [83036-A1C] 2)  Est. Patient Level III [62130]

## 2011-01-23 NOTE — Assessment & Plan Note (Signed)
Summary: 1 MO FU /NWS   Vital Signs:  Patient profile:   70 year old male Height:      63 inches (160.02 cm) Weight:      239.25 pounds (108.75 kg) BMI:     42.53 O2 Sat:      93 % on Room air Temp:     98.1 degrees F (36.72 degrees C) oral Pulse rate:   69 / minute Pulse rhythm:   regular BP sitting:   124 / 82  (left arm) Cuff size:   large  Vitals Entered By: Brenton Grills MA (October 19, 2010 11:08 AM)  O2 Flow:  Room air CC: 1 month F/U/aj Is Patient Diabetic? Yes   Referring Provider:  Romero Belling Primary Provider:  Minus Breeding MD  CC:  1 month F/U/aj.  History of Present Illness: pt says he is eating half-portions, but does not exercise.  pt states he feels well in general.  Current Medications (verified): 1)  Omeprazole 20 Mg  Tbec (Omeprazole) .... Take 1 By Mouth Qd 2)  Bisoprolol Fumarate 10 Mg Tabs (Bisoprolol Fumarate) .... One Tab By Mouth Once Daily 3)  Folbic 2.5-25-2 Mg  Tabs (Fa-Pyridoxine-Cyancobalamin) .... Take 1 By Mouth Qd 4)  Isosorbide Mononitrate Cr 60 Mg Xr24h-Tab (Isosorbide Mononitrate) .... One Tab By Mouth Once Daily 5)  Hydrocodone-Acetaminophen 5-500 Mg  Tabs (Hydrocodone-Acetaminophen) .... Take 1-2 Q 4 Hours Prn 6)  Trazodone Hcl 100 Mg  Tabs (Trazodone Hcl) .... Qhs 7)  Accu-Chek Compact Test Drum  Strp (Glucose Blood) .... Qid, Variable Glucoses, and Lancets 250.03 8)  Klor-Con M20 20 Meq  Tbcr (Potassium Chloride Crys Cr) .... Take 2 By Mouth Two Times A Day 9)  Combivent 103-18 Mcg/act  Aero (Ipratropium-Albuterol) .... Take 2 Puffs Qid Prn 10)  Bd Insulin Syringe 29g X 1/2" 1 Ml  Misc (Insulin Syringe-Needle U-100) .... Qid 11)  Triamcinolone Acetonide 0.025 % Crea (Triamcinolone Acetonide) .... Apply Three Times A Day On Affected Area As Needed 12)  Furosemide 80 Mg Tabs (Furosemide) .... Take 1 Q Am and 1/2 Pm 13)  Nebulizer 14)  Symbicort 160-4.5 Mcg/act  Aero (Budesonide-Formoterol Fumarate) .... Two Puffs Twice Daily 15)   Spiriva Handihaler 18 Mcg Caps (Tiotropium Bromide Monohydrate) .... Inhale Contents of 1 Capsule Once A Day 16)  Spironolactone 25 Mg Tabs (Spironolactone) .... Take 1 By Mouth Qd 17)  Adult Aspirin Low Strength 81 Mg Tbdp (Aspirin) .... Take 1 By Mouth Qd 18)  Novolog 100 Unit/ml Soln (Insulin Aspart) .... 4 Times A Day (Qac) 50-30-65-10units 19)  Novolin N 100 Unit/ml Susp (Insulin Isophane Human) .... 55 Units Qhs 20)  Nitrolingual 0.4 Mg/spray Soln (Nitroglycerin) .... As Directed As Needed 21)  Ferrous Sulfate 325 (65 Fe) Mg Tbec (Ferrous Sulfate) .... Take 1 Three Times A Day 22)  Astepro 0.15 % Soln (Azelastine Hcl) .... 2 Sprays Each Nostril Each Am 23)  Flomax 0.4 Mg Xr24h-Cap (Tamsulosin Hcl) .Marland Kitchen.. 1 Tab At Bedtime 24)  02 .... 2 Liters Qhs 25)  Robitussin Dm 100-10 Mg/89ml Syrp (Dextromethorphan-Guaifenesin) .... As Directed 26)  Zaroxolyn 2.5 Mg Tabs (Metolazone) .Marland Kitchen.. 1 Tab As Directed 27)  Allopurinol 300 Mg Tabs (Allopurinol) .Marland Kitchen.. 1 Once Daily 28)  Taztia Xt 180 Mg Xr24h-Cap (Diltiazem Hcl Er Beads) .Marland Kitchen.. 1 Tab Once Daily 29)  Calcitriol 0.25 Mcg Caps (Calcitriol) .Marland Kitchen.. 1 Tab Once Daily 30)  Crestor 10 Mg Tabs (Rosuvastatin Calcium) .Marland Kitchen.. 1 Tab Once Daily  Allergies (verified): 1)  ! 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 Hyperlipidemia  DM Neuropathy impotence Idiopathic Peripheral vascular disease Renal insufficiency Osteoarthritis CVA Cough due to Zestril  Current Problems:  LONG TERM USE OF ASPIRIN (ICD-V58.66) LONG TERM ANTIPLATELET THERAPY (ICD-V58.63) HYPERTENSION, BENIGN (ICD-401.1) DIASTOLIC HEART FAILURE, CHRONIC (ICD-428.32) CAD, NATIVE VESSEL (ICD-414.01) DYSPNEA (ICD-786.05) EMPHYSEMA (ICD-492.8) EDEMA (ICD-782.3) COUGH (ICD-786.2) SPECIAL SCREENING MALIGNANT NEOPLASM OF PROSTATE (ICD-V76.44) CONJUNCTIVITIS, RIGHT  (ICD-372.30) HYPOKALEMIA (ICD-276.8) CHF (ICD-428.0) OSTEOPOROSIS (ICD-733.00) HYPERTENSION (ICD-401.9) 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    Physical Exam  General:  obese.  no distress  Psych:  Alert and cooperative; normal mood and affect; normal attention span and concentration.     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 Patient Level III (16109)  Patient Instructions: 1)  continue reduced dietary portions. 2)  walk 3x a week. 3)  Please schedule a follow-up appointment in 1 month.   Orders Added: 1)  Est. Patient Level III [60454]

## 2011-01-23 NOTE — Assessment & Plan Note (Signed)
Summary: 3 month follow up-lb   Vital Signs:  Patient profile:   70 year old male Height:      63 inches (160.02 cm) Weight:      245 pounds (111.36 kg) BMI:     43.56 O2 Sat:      92 % on Room air Temp:     98.0 degrees F (36.67 degrees C) oral Pulse rate:   66 / minute BP sitting:   102 / 58  (left arm) Cuff size:   large  Vitals Entered By: Brenton Grills MA (August 01, 2010 11:33 AM)  O2 Flow:  Room air CC: 3 mo F/U/aj Is Patient Diabetic? Yes   Referring Provider:  Romero Belling Primary Provider:  Minus Breeding MD  CC:  3 mo F/U/aj.  History of Present Illness: the status of at least 3 ongoing medical problems is addressed today: dm:  no cbg record, but states cbg's are highest at hs and in am.  he says he forgets lunch insulin "most of the time." weight gain:  in his push to lose weight, he has used his own efforts, and has seen dietician twice over the past 5 years, at [facility].   gout:  no recent sxs.   Current Medications (verified): 1)  Metoclopramide Hcl 10 Mg  Tabs (Metoclopramide Hcl) .... Take 1 By Mouth Qid 2)  Omeprazole 20 Mg  Tbec (Omeprazole) .... Take 1 By Mouth Qd 3)  Taztia Xt 240 Mg Xr24h-Cap (Diltiazem Hcl Er Beads) .... Take One Tablet By Mouth Daily 4)  Bisoprolol Fumarate 10 Mg Tabs (Bisoprolol Fumarate) .... One Tab By Mouth Once Daily 5)  Calcitriol 0.5 Mcg  Caps (Calcitriol) .... Take 1 By Mouth Qd 6)  Folbic 2.5-25-2 Mg  Tabs (Fa-Pyridoxine-Cyancobalamin) .... Take 1 By Mouth Qd 7)  Isosorbide Mononitrate Cr 60 Mg Xr24h-Tab (Isosorbide Mononitrate) .... One Tab By Mouth Once Daily 8)  Hydrocodone-Acetaminophen 5-500 Mg  Tabs (Hydrocodone-Acetaminophen) .... Take 1-2 Q 4 Hours Prn 9)  Trazodone Hcl 100 Mg  Tabs (Trazodone Hcl) .... Qhs 10)  Accu-Chek Compact Test Drum  Strp (Glucose Blood) .... Qid, Variable Glucoses, and Lancets 250.03 11)  Allopurinol 100 Mg Tabs (Allopurinol) .... Take 1 Tablet By Mouth Once A Day 12)  Klor-Con M20 20  Meq  Tbcr (Potassium Chloride Crys Cr) .... Take 2 By Mouth Two Times A Day 13)  Combivent 103-18 Mcg/act  Aero (Ipratropium-Albuterol) .... Take 2 Puffs Qid Prn 14)  Bd Insulin Syringe 29g X 1/2" 1 Ml  Misc (Insulin Syringe-Needle U-100) .... Qid 15)  Vytorin 10-80 Mg  Tabs (Ezetimibe-Simvastatin) .Marland Kitchen.. 1 Qhs 16)  Wheelchair .Marland Kitchen.. 428.0 17)  Shower Chair .... 715.90 18)  Triamcinolone Acetonide 0.025 % Crea (Triamcinolone Acetonide) .... Apply Three Times A Day On Affected Area As Needed 19)  Furosemide 80 Mg Tabs (Furosemide) .... Take 1 Q Am and 1/2 Pm 20)  Nebulizer 21)  Symbicort 160-4.5 Mcg/act  Aero (Budesonide-Formoterol Fumarate) .... Two Puffs Twice Daily 22)  Spiriva Handihaler 18 Mcg Caps (Tiotropium Bromide Monohydrate) .... Inhale Contents of 1 Capsule Once A Day 23)  Spironolactone 25 Mg Tabs (Spironolactone) .... Take 1 By Mouth Qd 24)  Adult Aspirin Low Strength 81 Mg Tbdp (Aspirin) .... Take 1 By Mouth Qd 25)  Novolog 100 Unit/ml Soln (Insulin Aspart) .... 4 Times A Day (Qac) 50-30-65-10units 26)  Novolin N 100 Unit/ml Susp (Insulin Isophane Human) .... 70 Units Qhs 27)  Nitrolingual 0.4 Mg/spray Soln (Nitroglycerin) .Marland KitchenMarland KitchenMarland Kitchen  As Directed As Needed 28)  Ferrous Sulfate 325 (65 Fe) Mg Tbec (Ferrous Sulfate) .... Take 1 Three Times A Day 29)  Astepro 0.15 % Soln (Azelastine Hcl) .... 2 Sprays Each Nostril Each Am 30)  Flomax 0.4 Mg Xr24h-Cap (Tamsulosin Hcl) .Marland Kitchen.. 1 Tab At Bedtime 31)  02 .... 2 Liters Qhs 32)  Robitussin Dm 100-10 Mg/87ml Syrp (Dextromethorphan-Guaifenesin) .... As Directed 33)  Zaroxolyn 2.5 Mg Tabs (Metolazone) .Marland Kitchen.. 1 Tab As Directed 34)  Vitamin D 1000 Unit Tabs (Cholecalciferol) .... Take 1 Tablet By Mouth Once A Day  Allergies (verified): 1)  ! 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 Hyperlipidemia  DM  Neuropathy impotence Idiopathic Peripheral vascular disease Renal insufficiency Osteoarthritis CVA Cough due to Zestril  Current Problems:  LONG TERM USE OF ASPIRIN (ICD-V58.66) LONG TERM ANTIPLATELET THERAPY (ICD-V58.63) HYPERTENSION, BENIGN (ICD-401.1) DIASTOLIC HEART FAILURE, CHRONIC (ICD-428.32) CAD, NATIVE VESSEL (ICD-414.01) DYSPNEA (ICD-786.05) EMPHYSEMA (ICD-492.8) EDEMA (ICD-782.3) COUGH (ICD-786.2) SPECIAL SCREENING MALIGNANT NEOPLASM OF PROSTATE (ICD-V76.44) CONJUNCTIVITIS, RIGHT (ICD-372.30) HYPOKALEMIA (ICD-276.8) CHF (ICD-428.0) OSTEOPOROSIS (ICD-733.00) HYPERTENSION (ICD-401.9) 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         pt states he feels well in general, except for fatigue   Physical Exam  General:  obese.  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 1 once daily  Other Orders: T-Parathyroid Hormone, Intact w/ Calcium (78469-62952) TLB-A1C / Hgb A1C (Glycohemoglobin)  (83036-A1C) TLB-BMP (Basic Metabolic Panel-BMET) (80048-METABOL) TLB-Uric Acid, Blood (84550-URIC)  Patient Instructions: 1)  tests are being ordered for you today.  a few days after the test(s), please call [phone] to hear your test results. 2)  pending the test results, please continue the same medications for now, and redouble efforts to remember lunch insulin.   3)  Please schedule a physical appointment in 3 months. 4)  (update: i left message on phone-tree:  increase allopurinol to 300 mg once daily.  continue to pursue lunch insulin). Prescriptions: ALLOPURINOL 300 MG TABS (ALLOPURINOL) 1 once daily  #30 x 11   Entered and Authorized by:   Minus Breeding MD   Signed by:   Minus Breeding MD on 08/01/2010   Method used:   Electronically to        [facility] [address] #[id number]* (retail)       [address]       [place], [place]  [address]       Ph:  [phone]       Fax: [phone]   RxID:   [id number] KLOR-CON M20 20 MEQ  TBCR (POTASSIUM CHLORIDE CRYS CR) take 2 by mouth two times a day  #360 x 3   Entered and Authorized by:   Minus Breeding MD   Signed by:   Minus Breeding MD on 08/01/2010   Method used:   Electronically to        [facility] [address] #[id number]* (retail)       [address] [place], [place]  [address]       Ph: [phone]       Fax: [phone]   RxID:   [id number] BISOPROLOL FUMARATE 10 MG TABS (BISOPROLOL FUMARATE) one tab by mouth once daily  #90 x 3   Entered and Authorized by:   Minus Breeding MD   Signed by:   Minus Breeding MD on 08/01/2010   Method used:   Electronically to        [facility] #[id number]* (retail)       [address] [place], [place]  [address]       Ph: [phone]       Fax: [phone]   RxID:   [id number]

## 2011-01-23 NOTE — Assessment & Plan Note (Signed)
Summary: 6 month rov  Medications Added BISOPROLOL FUMARATE 10 MG TABS (BISOPROLOL FUMARATE) one tab by mouth once daily ISOSORBIDE MONONITRATE CR 60 MG XR24H-TAB (ISOSORBIDE MONONITRATE) one tab by mouth once daily * 02 2 liters qhs ROBITUSSIN DM 100-10 MG/5ML SYRP (DEXTROMETHORPHAN-GUAIFENESIN) as directed      Allergies Added:   Visit Type:  Follow-up Referring Provider:  Romero Belling Primary Provider:  Minus Breeding MD  CC:  cough and chest pain pt has used nitro.  History of Present Illness: Patient is 70 years old and return for management of CAD and diastolic CHF. He has had multiple prior PCI procedures. He has a long history of diastolic heart failure. He also has renal insufficiency with creatinines in the range of 2.0 his last BMP was in July 2010.  Recently he has developed increasing symptoms of chest pain. He has had chest pain at night and had to take nitroglycerin the last 3 nights. He also feels like he has some excess fluid. He also complains of a cough productive of a white clear liquid. He has had no fever.  Current Medications (verified): 1)  Metoclopramide Hcl 10 Mg  Tabs (Metoclopramide Hcl) .... Take 1 By Mouth Qid 2)  Omeprazole 20 Mg  Tbec (Omeprazole) .... Take 1 By Mouth Qd 3)  Taztia Xt 300 Mg  Cp24 (Diltiazem Hcl Er Beads) .... Take 1 By Mouth Qd 4)  Bisoprolol Fumarate 5 Mg  Tabs (Bisoprolol Fumarate) .... Take 1 By Mouth Qd 5)  Calcitriol 0.5 Mcg  Caps (Calcitriol) .... Take 1 By Mouth Qd 6)  Folbic 2.5-25-2 Mg  Tabs (Fa-Pyridoxine-Cyancobalamin) .... Take 1 By Mouth Qd 7)  Isosorbide Dinitrate 30 Mg  Tabs (Isosorbide Dinitrate) .... Take 1 By Mouth Qd 8)  Hydrocodone-Acetaminophen 5-500 Mg  Tabs (Hydrocodone-Acetaminophen) .... Take 1-2 Q 4 Hours Prn 9)  Trazodone Hcl 100 Mg  Tabs (Trazodone Hcl) .... Qhs 10)  Accu-Chek Compact Test Drum  Strp (Glucose Blood) .... Qid, Variable Glucoses, and Lancets 250.03 11)  Allopurinol 100 Mg Tabs (Allopurinol)  .... Take 1 Tablet By Mouth Once A Day 12)  Klor-Con M20 20 Meq  Tbcr (Potassium Chloride Crys Cr) .... Take 2 By Mouth Two Times A Day 13)  Combivent 103-18 Mcg/act  Aero (Ipratropium-Albuterol) .... Take 2 Puffs Qid Prn 14)  Bd Insulin Syringe 29g X 1/2" 1 Ml  Misc (Insulin Syringe-Needle U-100) .... Qid 15)  Vytorin 10-80 Mg  Tabs (Ezetimibe-Simvastatin) .Marland Kitchen.. 1 Qhs 16)  Wheelchair .Marland Kitchen.. 428.0 17)  Shower Chair .... 715.90 18)  Triamcinolone Acetonide 0.025 % Crea (Triamcinolone Acetonide) .... Apply Three Times A Day On Affected Area As Needed 19)  Furosemide 80 Mg Tabs (Furosemide) .... Take 1 Q Am and 1/2 Pm 20)  Duoneb 0.5-2.5 (3) Mg/42ml Soln (Ipratropium-Albuterol) .... Q3h As Needed Sob 21)  Nebulizer 22)  Symbicort 160-4.5 Mcg/act  Aero (Budesonide-Formoterol Fumarate) .... Two Puffs Twice Daily 23)  Spiriva Handihaler 18 Mcg Caps (Tiotropium Bromide Monohydrate) .... Inhale Contents of 1 Capsule Once A Day 24)  Spironolactone 25 Mg Tabs (Spironolactone) .... Take 1 By Mouth Qd 25)  Metalazone 2.5mg  .... Take 1 Q Mon,wed and Fri 26)  Adult Aspirin Low Strength 81 Mg Tbdp (Aspirin) .... Take 1 By Mouth Qd 27)  Novolog 100 Unit/ml Soln (Insulin Aspart) .... 4 Times A Day (Qac) 50-30-65-10units 28)  Novolin N 100 Unit/ml Susp (Insulin Isophane Human) .... 70 Units Qhs 29)  Nitrolingual 0.4 Mg/spray Soln (Nitroglycerin) .... As Directed As  Needed 30)  Ferrous Sulfate 325 (65 Fe) Mg Tbec (Ferrous Sulfate) .... Take 1 Three Times A Day 31)  Astepro 0.15 % Soln (Azelastine Hcl) .... 2 Sprays Each Nostril Each Am 32)  Flomax 0.4 Mg Xr24h-Cap (Tamsulosin Hcl) .Marland Kitchen.. 1 Tab At Bedtime 33)  02 .... 2 Liters Qhs  Allergies (verified): 1)  ! * Shellfish 2)  Enalapril Maleate  Past History:  Past Medical History: Reviewed history from 02/02/2009 and no changes required. 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 Hyperlipidemia  DM Neuropathy impotence Idiopathic Peripheral  vascular disease Renal insufficiency Osteoarthritis CVA Cough due to Zestril  Current Problems:  LONG TERM USE OF ASPIRIN (ICD-V58.66) LONG TERM ANTIPLATELET THERAPY (ICD-V58.63) HYPERTENSION, BENIGN (ICD-401.1) DIASTOLIC HEART FAILURE, CHRONIC (ICD-428.32) CAD, NATIVE VESSEL (ICD-414.01) DYSPNEA (ICD-786.05) EMPHYSEMA (ICD-492.8) EDEMA (ICD-782.3) COUGH (ICD-786.2) SPECIAL SCREENING MALIGNANT NEOPLASM OF PROSTATE (ICD-V76.44) CONJUNCTIVITIS, RIGHT (ICD-372.30) HYPOKALEMIA (ICD-276.8) CHF (ICD-428.0) OSTEOPOROSIS (ICD-733.00) HYPERTENSION (ICD-401.9) 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   Vital Signs:  Patient profile:   70 year old male Height:      63 inches Weight:      236 pounds O2 Sat:      93 % on Room air Pulse rate:   80 / minute BP sitting:   122 / 64  (left arm) Cuff size:   large  Vitals Entered By: Burnett Kanaris, CNA (December 26, 2009 12:22 PM)  O2 Flow:  Room air  Physical Exam  Additional Exam:  Gen. Well-nourished, in no distress   Neck: No JVD, thyroid not enlarged, no carotid bruits Lungs: No tachypnea, clear without rales, scattered wheezes Cardiovascular: Rhythm regular, PMI not displaced,  heart sounds  normal, no murmurs or gallops, 1+ bilateral peripheral edema, pulses normal in all 4 extremities. Abdomen: BS normal, abdomen soft and non-tender without masses or organomegaly, no hepatosplenomegaly. MS: No deformities, no  cyanosis or clubbing   Neuro:  No focal sns   Skin:  no lesions    Impression & Recommendations:  Problem # 1:  CHEST PAIN-PRECORDIAL (ICD-786.51) He has had nocturnal pain the last 3 nights that I am concerned may represent angina. He also has a cough productive of slight whitish sputum.  His ECG today is normal.  We will increase his Imdur from 30 mg to 60 mg and increase his bisoprolol from 5 mg to 10 mg daily. We will arrange followup with Janan Ridge in 3 weeks. If that if his problems subside then I'll see him in 6 months. If his symptoms persist he may require catheterization.  Problem # 2:  CAD, NATIVE VESSEL (ICD-414.01)  Hhas had multiple prior PCI procedures. With his recent chest pain we planned further change in treatment and possible further evaluation as described above. His updated medication list for this problem includes:    Taztia Xt 300 Mg Cp24 (Diltiazem hcl er beads) .Marland Kitchen... Take 1 by mouth qd    Bisoprolol Fumarate 10 Mg Tabs (Bisoprolol fumarate) ..... One tab by mouth once daily    Isosorbide Mononitrate Cr 60 Mg Xr24h-tab (Isosorbide mononitrate) ..... One tab by mouth once daily    Adult Aspirin Low  Strength 81 Mg Tbdp (Aspirin) .Marland Kitchen... Take 1 by mouth qd    Nitrolingual 0.4 Mg/spray Soln (Nitroglycerin) .Marland Kitchen... As directed as needed  His updated medication list for this problem includes:    Taztia Xt 300 Mg Cp24 (Diltiazem hcl er beads) .Marland Kitchen... Take 1 by mouth qd    Bisoprolol Fumarate 10 Mg Tabs (Bisoprolol fumarate) ..... One tab by mouth once daily    Isosorbide Mononitrate Cr 60 Mg Xr24h-tab (Isosorbide mononitrate) ..... One tab by mouth once daily    Adult Aspirin Low Strength 81 Mg Tbdp (Aspirin) .Marland Kitchen... Take 1 by mouth qd    Nitrolingual 0.4 Mg/spray Soln (Nitroglycerin) .Marland Kitchen... As directed as needed  Orders: EKG w/ Interpretation (93000) TLB-BMP (Basic Metabolic Panel-BMET) (80048-METABOL) TLB-CBC Platelet - w/Differential (85025-CBCD)  Problem # 3:   DIASTOLIC HEART FAILURE, CHRONIC (ICD-428.32)  He has no JVD and only trace to 1+ peripheral edema. I think his diastolic heart failure is fairly well compensated. His updated medication list for this problem includes:    Taztia Xt 300 Mg Cp24 (Diltiazem hcl er beads) .Marland Kitchen... Take 1 by mouth qd    Bisoprolol Fumarate 10 Mg Tabs (Bisoprolol fumarate) ..... One tab by mouth once daily    Isosorbide Mononitrate Cr 60 Mg Xr24h-tab (Isosorbide mononitrate) ..... One tab by mouth once daily    Furosemide 80 Mg Tabs (Furosemide) .Marland Kitchen... Take 1 q am and 1/2 pm    Spironolactone 25 Mg Tabs (Spironolactone) .Marland Kitchen... Take 1 by mouth qd    Adult Aspirin Low Strength 81 Mg Tbdp (Aspirin) .Marland Kitchen... Take 1 by mouth qd    Nitrolingual 0.4 Mg/spray Soln (Nitroglycerin) .Marland Kitchen... As directed as needed  His updated medication list for this problem includes:    Taztia Xt 300 Mg Cp24 (Diltiazem hcl er beads) .Marland Kitchen... Take 1 by mouth qd    Bisoprolol Fumarate 10 Mg Tabs (Bisoprolol fumarate) ..... One tab by mouth once daily    Isosorbide Mononitrate Cr 60 Mg Xr24h-tab (Isosorbide mononitrate) ..... One tab by mouth once daily    Furosemide 80 Mg Tabs (Furosemide) .Marland Kitchen... Take 1 q am and 1/2 pm    Spironolactone 25 Mg Tabs (Spironolactone) .Marland Kitchen... Take 1 by mouth qd    Adult Aspirin Low Strength 81 Mg Tbdp (Aspirin) .Marland Kitchen... Take 1 by mouth qd    Nitrolingual 0.4 Mg/spray Soln (Nitroglycerin) .Marland Kitchen... As directed as needed  Problem # 4:  COUGH (ICD-786.2)  He has had a cough for the past couple of weeks. This is nonproductive. We'll get a chest x-ray to evaluate this further. His updated medication list for this problem includes:    Taztia Xt 300 Mg Cp24 (Diltiazem hcl er beads) .Marland Kitchen... Take 1 by mouth qd    Bisoprolol Fumarate 10 Mg Tabs (Bisoprolol fumarate) ..... One tab by mouth once daily    Isosorbide Mononitrate Cr 60 Mg Xr24h-tab (Isosorbide mononitrate) ..... One tab by mouth once daily    Adult Aspirin Low Strength 81 Mg Tbdp  (Aspirin) .Marland Kitchen... Take 1 by mouth qd    Nitrolingual 0.4 Mg/spray Soln (Nitroglycerin) .Marland Kitchen... As directed as needed  His updated medication list for this problem includes:    Taztia Xt 300 Mg Cp24 (Diltiazem hcl er beads) .Marland Kitchen... Take 1 by mouth qd    Bisoprolol Fumarate 10 Mg Tabs (Bisoprolol fumarate) ..... One tab by mouth once daily    Isosorbide Mononitrate Cr 60 Mg Xr24h-tab (Isosorbide mononitrate) ..... One tab by mouth once daily    Adult Aspirin Low Strength  81 Mg Tbdp (Aspirin) .Marland Kitchen... Take 1 by mouth qd    Nitrolingual 0.4 Mg/spray Soln (Nitroglycerin) .Marland Kitchen... As directed as needed  Patient Instructions: 1)  Your physician recommends that you schedule a follow-up appointment in: 3 weeks with the PA. 2)  Your physician has recommended you make the following change in your medication: 1) Increase isosorbide to 60mg  once daily , 2) Increase bisoprolol to 10mg  once daily. 3) You may take Robitussin DM (over the counter) as directed on the label. 3)  Your physician wants you to follow-up in: 6 months with Dr. [name].  You will receive a reminder letter in the mail two months in advance. If you don't receive a letter, please call our office to schedule the follow-up appointment. 4)  A chest x-ray takes a picture of the organs and structures inside the chest, including the heart, lungs, and blood vessels. This test can show several things, including, whether the heart is enlarged; whether fluid is building up in the lungs; and whether pacemaker / defibrillator leads are still in place. 5)  Your physician recommends that you have lab work today: bmet/cbc (786.50;414.01;428.33) Prescriptions: ISOSORBIDE MONONITRATE CR 60 MG XR24H-TAB (ISOSORBIDE MONONITRATE) one tab by mouth once daily  #30 x 6   Entered by:   Sherri Rad, RN, BSN   Authorized by:   Lenoria Farrier, MD, [facility]   Signed by:   Sherri Rad, RN, BSN on 12/26/2009   Method used:   Electronically to        [id number]* (retail)       [address] [place], [place]  [address]       Ph: [phone]       Fax: [phone]   RxID:   [id number] BISOPROLOL FUMARATE 10 MG TABS (BISOPROLOL FUMARATE) one tab by mouth once daily  #30 x 6   Entered by:   Sherri Rad, RN, BSN   Authorized by:   Lenoria Farrier, MD, [facility]   Signed by:   Sherri Rad, RN, BSN on 12/26/2009   Method used:   Electronically to        [facility] [address] #[id number]* (retail)       [address] [place], [place]  [address]       Ph: [phone]       Fax: [phone]   RxID:   [id number]

## 2011-01-23 NOTE — Letter (Signed)
Summary: [facility]   Imported By: Sherian Rein 05/03/2010 08:23:28  _____________________________________________________________________  External Attachment:    Type:   Image     Comment:   External Document

## 2011-01-23 NOTE — Progress Notes (Signed)
Summary: pt wants to have lap-band surgery    Phone Note Call from Patient Call back at Home Phone [phone]   Caller: Patient Reason for Call: Talk to Nurse Details for Reason: pt wants to have lab-band surgery. what are your thoughts.  Initial call taken by: Lorne Skeens,  May 08, 2010 2:44 PM  Follow-up for Phone Call        I called and spoke with the pt and made him aware that I would talk with Dr. [name] tomorrow about this and call him back. His surgery would be at the [place]. Sherri Rad, RN, BSN  May 08, 2010 5:01 PM   I have reviewed this with Dr. [name]. He states he would support the pt. proceeding with the lap- band surgery. He would probably need a stress test prior to surgery to clear him from a cardiac perspective. I have called the pt and made him aware of this. He verbalizes understanding. Follow-up by: Sherri Rad, RN, BSN,  May 09, 2010 1:44 PM

## 2011-01-23 NOTE — Assessment & Plan Note (Signed)
Summary: 3wk f/u with pa per brodie    Visit Type:  Follow-up Referring Provider:  Romero [name] Primary Provider:  Minus Breeding MD  CC:  no complaints.  History of Present Illness: This is a 70 year old white [name] patient, who saw Dr. [name], 3 weeks ago, with increasing symptoms of chest pain. Dr. [name] placed him on Imdur were, but also felt his symptoms may be coming from a productive cough that he had been having.   The patient is doing much better without further chest pain. He believes it was secondary to the cough, but the Imdur may have helped some, as well. He is sleeping better, and has not had to use nitroglycerin. The cough has resolved as well.  Current Medications (verified): 1)  Metoclopramide Hcl 10 Mg  Tabs (Metoclopramide Hcl) .... Take 1 By Mouth Qid 2)  Omeprazole 20 Mg  Tbec (Omeprazole) .... Take 1 By Mouth Qd 3)  Taztia Xt 300 Mg  Cp24 (Diltiazem Hcl Er Beads) .... Take 1 By Mouth Qd 4)  Bisoprolol Fumarate 10 Mg Tabs (Bisoprolol Fumarate) .... One Tab By Mouth Once Daily 5)  Calcitriol 0.5 Mcg  Caps (Calcitriol) .... Take 1 By Mouth Qd 6)  Folbic 2.5-25-2 Mg  Tabs (Fa-Pyridoxine-Cyancobalamin) .... Take 1 By Mouth Qd 7)  Isosorbide Mononitrate Cr 60 Mg Xr24h-Tab (Isosorbide Mononitrate) .... One Tab By Mouth Once Daily 8)  Hydrocodone-Acetaminophen 5-500 Mg  Tabs (Hydrocodone-Acetaminophen) .... Take 1-2 Q 4 Hours Prn 9)  Trazodone Hcl 100 Mg  Tabs (Trazodone Hcl) .... Qhs 10)  Accu-Chek Compact Test Drum  Strp (Glucose Blood) .... Qid, Variable Glucoses, and Lancets 250.03 11)  Allopurinol 100 Mg Tabs (Allopurinol) .... Take 1 Tablet By Mouth Once A Day 12)  Klor-Con M20 20 Meq  Tbcr (Potassium Chloride Crys Cr) .... Take 2 By Mouth Two Times A Day 13)  Combivent 103-18 Mcg/act  Aero (Ipratropium-Albuterol) .... Take 2 Puffs Qid Prn 14)  Bd Insulin Syringe 29g X 1/2" 1 Ml  Misc (Insulin Syringe-Needle U-100) .... Qid 15)  Vytorin 10-80 Mg  Tabs  (Ezetimibe-Simvastatin) .Marland Kitchen.. 1 Qhs 16)  Wheelchair .Marland Kitchen.. 428.0 17)  Shower Chair .... 715.90 18)  Triamcinolone Acetonide 0.025 % Crea (Triamcinolone Acetonide) .... Apply Three Times A Day On Affected Area As Needed 19)  Furosemide 80 Mg Tabs (Furosemide) .... Take 1 Q Am and 1/2 Pm 20)  Nebulizer 21)  Symbicort 160-4.5 Mcg/act  Aero (Budesonide-Formoterol Fumarate) .... Two Puffs Twice Daily 22)  Spiriva Handihaler 18 Mcg Caps (Tiotropium Bromide Monohydrate) .... Inhale Contents of 1 Capsule Once A Day 23)  Spironolactone 25 Mg Tabs (Spironolactone) .... Take 1 By Mouth Qd 24)  Metalazone 2.5mg  .... Take 1 Q Mon,wed and Fri 25)  Adult Aspirin Low Strength 81 Mg Tbdp (Aspirin) .... Take 1 By Mouth Qd 26)  Novolog 100 Unit/ml Soln (Insulin Aspart) .... 4 Times A Day (Qac) 50-30-65-10units 27)  Novolin N 100 Unit/ml Susp (Insulin Isophane Human) .... 70 Units Qhs 28)  Nitrolingual 0.4 Mg/spray Soln (Nitroglycerin) .... As Directed As Needed 29)  Ferrous Sulfate 325 (65 Fe) Mg Tbec (Ferrous Sulfate) .... Take 1 Three Times A Day 30)  Astepro 0.15 % Soln (Azelastine Hcl) .... 2 Sprays Each Nostril Each Am 31)  Flomax 0.4 Mg Xr24h-Cap (Tamsulosin Hcl) .Marland Kitchen.. 1 Tab At Bedtime 32)  02 .... 2 Liters Qhs 33)  Robitussin Dm 100-10 Mg/2ml Syrp (Dextromethorphan-Guaifenesin) .... As Directed  Allergies: 1)  ! 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 Hyperlipidemia  DM Neuropathy impotence Idiopathic Peripheral vascular disease Renal insufficiency Osteoarthritis CVA Cough due to Zestril  Current Problems:  LONG TERM USE OF ASPIRIN (ICD-V58.66) LONG TERM ANTIPLATELET THERAPY (ICD-V58.63) HYPERTENSION, BENIGN (ICD-401.1) DIASTOLIC HEART FAILURE, CHRONIC (ICD-428.32) CAD, NATIVE VESSEL (ICD-414.01) DYSPNEA (ICD-786.05) EMPHYSEMA (ICD-492.8) EDEMA (ICD-782.3) COUGH  (ICD-786.2) SPECIAL SCREENING MALIGNANT NEOPLASM OF PROSTATE (ICD-V76.[name]) CONJUNCTIVITIS, RIGHT (ICD-372.30) HYPOKALEMIA (ICD-276.8) CHF (ICD-428.0) OSTEOPOROSIS (ICD-733.00) HYPERTENSION (ICD-401.9) 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 Patient is in a wheelchair  Vital Signs:  Patient profile:   70 year old [name] Height:      63 inches Weight:      238 pounds Pulse rate:   68 / minute Pulse rhythm:   regular BP sitting:   132 / 64  (right arm)  Vitals Entered By: Jacquelin Hawking, CMA (January 18, 2010 11:20 AM)  Physical Exam  General:   Well-nournished, in no acute distress. Neck: No JVD, HJR, Bruit, or thyroid enlargement Lungs: No tachypnea, Decreased breath sounds throughout, clear without wheezing, rales, or rhonchi Cardiovascular: RRR, PMI not displaced,S4 and 2/6 systolic murmur at the left sternal border, no bruit, thrill, or heave. Abdomen: BS normal. Soft without organomegaly, masses, lesions or tenderness. Extremities: chronic +1 ankle edema,without cyanosis, clubbing.Peri [place] distal pulses bilateral SKin: Warm, no lesions or rashes  Musculoskeletal: No deformities Neuro: no focal signs    Impression & Recommendations:  Problem # 1:  CHEST PAIN-PRECORDIAL (ICD-786.51) Patient's chest pain has resolved as the cough has resolved and with the addition of Imdur. Will continue medical therapy. His updated medication list for this problem includes:    Taztia Xt  300 Mg Cp24 (Diltiazem hcl er beads) .Marland Kitchen... Take 1 by mouth qd    Bisoprolol Fumarate 10 Mg Tabs (Bisoprolol fumarate) ..... One tab by mouth once daily    Isosorbide Mononitrate Cr 60 Mg Xr24h-tab (Isosorbide mononitrate) ..... One tab by mouth once daily    Adult Aspirin Low Strength 81 Mg Tbdp (Aspirin) .Marland Kitchen... Take 1 by mouth qd    Nitrolingual 0.4 Mg/spray Soln (Nitroglycerin) .Marland Kitchen... As directed as needed  Problem # 2:  CAD, NATIVE VESSEL (ICD-414.01) patient has history of coronary artery disease with multiple PCI's in the past. His chest pain has resolved. 2 medical therapy. His updated medication list for this problem includes:    Taztia Xt 300 Mg Cp24 (Diltiazem hcl er beads) .Marland Kitchen... Take 1 by mouth qd    Bisoprolol Fumarate 10 Mg Tabs (Bisoprolol fumarate) ..... One tab by mouth once daily    Isosorbide Mononitrate Cr 60 Mg Xr24h-tab (Isosorbide mononitrate) ..... One tab by mouth once daily    Adult Aspirin Low Strength 81 Mg Tbdp (Aspirin) .Marland Kitchen... Take 1 by mouth qd    Nitrolingual 0.4 Mg/spray Soln (Nitroglycerin) .Marland Kitchen... As directed as needed  Problem # 3:  DIASTOLIC HEART FAILURE, CHRONIC (ICD-428.32) Patient's diastolic heart failure is controlled. He has mild ankle edema that is chronic. His updated medication list for this problem includes:    Taztia Xt 300 Mg Cp24 (Diltiazem hcl er beads) .Marland KitchenMarland KitchenMarland KitchenMarland Kitchen  Take 1 by mouth qd    Bisoprolol Fumarate 10 Mg Tabs (Bisoprolol fumarate) ..... One tab by mouth once daily    Isosorbide Mononitrate Cr 60 Mg Xr24h-tab (Isosorbide mononitrate) ..... One tab by mouth once daily    Furosemide 80 Mg Tabs (Furosemide) .Marland Kitchen... Take 1 q am and 1/2 pm    Spironolactone 25 Mg Tabs (Spironolactone) .Marland Kitchen... Take 1 by mouth qd    Adult Aspirin Low Strength 81 Mg Tbdp (Aspirin) .Marland Kitchen... Take 1 by mouth qd    Nitrolingual 0.4 Mg/spray Soln (Nitroglycerin) .Marland Kitchen... As directed as needed  Patient Instructions: 1)  Your physician recommends that you schedule a follow-up  appointment in: 6 months with Dr. [name].

## 2011-01-23 NOTE — Letter (Signed)
Summary: [facility]  [facility]   Imported By: Lester Morven 08/09/2010 09:44:17  _____________________________________________________________________  External Attachment:    Type:   Image     Comment:   External Document

## 2011-01-23 NOTE — Letter (Signed)
Summary: Generic Letter  [facility]  [address] [place], [place] [address]   Phone: [phone]  Fax: [phone]    08/03/2010  [name] [address] [place], [place]  [address]  Dear Mr. BARBARO,   This is to advise you to go ahead with bariatric surgery.  You have been a patient of [facility] for many years.  You have many complications of obesity, including: diabetes, hypertension, osteoarthritis, dyspnea, coronary artery disease,pad, cva, edema, and foot pain.  Your own efforts, as well as seeing the dietician twice, have not significantly improved your weight.  I would be happy to follow you here in the office after your surgery.   Sincerely,   Romero Belling MD

## 2011-01-23 NOTE — Assessment & Plan Note (Signed)
Summary: rov for emphysema   Copy to:  Romero Belling Primary Provider/Referring Provider:  Minus Breeding MD  CC:  Pt is here for a 6 month f/u appt.  Pt states breathing is  "good."   Pt states occ productive cough with clear sputum but believes this is d/t allergies/pollen.   Pt denied any new complaints/concerns. .  History of Present Illness: the pt comes in today for f/u of his emphysema.  He is compliant with his medical regimen, and has not had any recent acute exacerbation.  He denies significant cough or mucus.  His exertional tolerance is at its usual baseline.  Current Medications (verified): 1)  Metoclopramide Hcl 10 Mg  Tabs (Metoclopramide Hcl) .... Take 1 By Mouth Qid 2)  Omeprazole 20 Mg  Tbec (Omeprazole) .... Take 1 By Mouth Qd 3)  Taztia Xt 300 Mg  Cp24 (Diltiazem Hcl Er Beads) .... Take 1 By Mouth Qd 4)  Bisoprolol Fumarate 10 Mg Tabs (Bisoprolol Fumarate) .... One Tab By Mouth Once Daily 5)  Calcitriol 0.5 Mcg  Caps (Calcitriol) .... Take 1 By Mouth Qd 6)  Folbic 2.5-25-2 Mg  Tabs (Fa-Pyridoxine-Cyancobalamin) .... Take 1 By Mouth Qd 7)  Isosorbide Mononitrate Cr 60 Mg Xr24h-Tab (Isosorbide Mononitrate) .... One Tab By Mouth Once Daily 8)  Hydrocodone-Acetaminophen 5-500 Mg  Tabs (Hydrocodone-Acetaminophen) .... Take 1-2 Q 4 Hours Prn 9)  Trazodone Hcl 100 Mg  Tabs (Trazodone Hcl) .... Qhs 10)  Accu-Chek Compact Test Drum  Strp (Glucose Blood) .... Qid, Variable Glucoses, and Lancets 250.03 11)  Allopurinol 100 Mg Tabs (Allopurinol) .... Take 1 Tablet By Mouth Once A Day 12)  Klor-Con M20 20 Meq  Tbcr (Potassium Chloride Crys Cr) .... Take 2 By Mouth Two Times A Day 13)  Combivent 103-18 Mcg/act  Aero (Ipratropium-Albuterol) .... Take 2 Puffs Qid Prn 14)  Bd Insulin Syringe 29g X 1/2" 1 Ml  Misc (Insulin Syringe-Needle U-100) .... Qid 15)  Vytorin 10-80 Mg  Tabs (Ezetimibe-Simvastatin) .Marland Kitchen.. 1 Qhs 16)  Wheelchair .Marland Kitchen.. 428.0 17)  Shower Chair .... 715.90 18)   Triamcinolone Acetonide 0.025 % Crea (Triamcinolone Acetonide) .... Apply Three Times A Day On Affected Area As Needed 19)  Furosemide 80 Mg Tabs (Furosemide) .... Take 1 Q Am and 1/2 Pm 20)  Nebulizer 21)  Symbicort 160-4.5 Mcg/act  Aero (Budesonide-Formoterol Fumarate) .... Two Puffs Twice Daily 22)  Spiriva Handihaler 18 Mcg Caps (Tiotropium Bromide Monohydrate) .... Inhale Contents of 1 Capsule Once A Day 23)  Spironolactone 25 Mg Tabs (Spironolactone) .... Take 1 By Mouth Qd 24)  Adult Aspirin Low Strength 81 Mg Tbdp (Aspirin) .... Take 1 By Mouth Qd 25)  Novolog 100 Unit/ml Soln (Insulin Aspart) .... 4 Times A Day (Qac) 50-30-65-10units 26)  Novolin N 100 Unit/ml Susp (Insulin Isophane Human) .... 70 Units Qhs 27)  Nitrolingual 0.4 Mg/spray Soln (Nitroglycerin) .... As Directed As Needed 28)  Ferrous Sulfate 325 (65 Fe) Mg Tbec (Ferrous Sulfate) .... Take 1 Three Times A Day 29)  Astepro 0.15 % Soln (Azelastine Hcl) .... 2 Sprays Each Nostril Each Am 30)  Flomax 0.4 Mg Xr24h-Cap (Tamsulosin Hcl) .Marland Kitchen.. 1 Tab At Bedtime 31)  02 .... 2 Liters Qhs 32)  Robitussin Dm 100-10 Mg/15ml Syrp (Dextromethorphan-Guaifenesin) .... As Directed 33)  Zaroxolyn 2.5 Mg Tabs (Metolazone) .Marland Kitchen.. 1 Tab As Directed  Allergies (verified): 1)  ! * Shellfish 2)  Enalapril Maleate  Review of Systems      See HPI  Vital Signs:  Patient profile:   70 year old male Height:      63 inches Weight:      245 pounds BMI:     43.56 O2 Sat:      92 % on Room air Temp:     98.0 degrees F oral Pulse rate:   70 / minute BP sitting:   116 / 58  (left arm) Cuff size:   large  Vitals Entered By: Arman Filter LPN (Apr 28, 8526 10:56 AM)  O2 Flow:  Room air CC: Pt is here for a 6 month f/u appt.  Pt states breathing is  "good."   Pt states occ productive cough with clear sputum but believes this is d/t allergies/pollen.   Pt denied any new complaints/concerns.  Comments Medications reviewed with patient Arman Filter LPN  Apr 29, 7823 10:56 AM    Physical Exam  General:  ow male in nad Lungs:  fairly clear to auscultation Heart:  rrr Extremities:  mild LE edema, no cyanosis   Impression & Recommendations:  Problem # 1:  EMPHYSEMA (ICD-492.8) the pt is doing well from a pulmonary standpoint, with no recent acute exacerbation or excessive rescue inhaler use.  I have asked him to work as hard as he can on weight loss, and to f/u with me in 6mos if doing well.  Medications Added to Medication List This Visit: 1)  Vitamin D 1000 Unit Tabs (Cholecalciferol) .... Take 1 tablet by mouth once a day  Other Orders: Est. Patient Level II (23536)  Patient Instructions: 1)  no change in meds. 2)  stay as active as you can and work on weight loss 3)  followup with me in 6mos.  Prescriptions: SYMBICORT 160-4.5 MCG/ACT  AERO (BUDESONIDE-FORMOTEROL FUMARATE) Two puffs twice daily  #1 x 6   Entered and Authorized by:   Barbaraann Share MD   Signed by:   Barbaraann Share MD on 04/28/2010   Method used:   Print then Give to Patient   RxID:   [id number]

## 2011-01-23 NOTE — Progress Notes (Signed)
Summary: drug interacton  Phone Note From Pharmacy Call back at [phone]   Caller: [facility] [address] #[id number]* Summary of Call: [facility] called about drug interaction between simvastatin (Vytorin 10-80mg ) and Taztia. Please advise. Initial call taken by: Brenton Grills MA,  September 19, 2010 11:54 AM  Follow-up for Phone Call        i changed vytorin to crestor 10/d.  please advise pt. Follow-up by: Minus Breeding MD,  September 19, 2010 12:39 PM  Additional Follow-up for Phone Call Additional follow up Details #1::        left detailed message for pt regarding new medication and to callback office with any questions or concerns Additional Follow-up by: Brenton Grills MA,  September 19, 2010 1:44 PM    New/Updated Medications: CRESTOR 10 MG TABS (ROSUVASTATIN CALCIUM) 1 tab once daily Prescriptions: CRESTOR 10 MG TABS (ROSUVASTATIN CALCIUM) 1 tab once daily  #30 x 11   Entered and Authorized by:   Minus Breeding MD   Signed by:   Minus Breeding MD on 09/19/2010   Method used:   Electronically to        [facility] [address] #[id number]* (retail)       [address]       [place], [place]  [address]       Ph: [phone]       Fax: [phone]   RxID:   [id number]

## 2011-01-23 NOTE — Miscellaneous (Signed)
Summary: BONE DENSITY  Clinical Lists Changes  Orders: Added new Test order of T-Bone Densitometry (77080) - Signed Added new Test order of T-Lumbar Vertebral Assessment (77082) - Signed 

## 2011-01-23 NOTE — Assessment & Plan Note (Signed)
Summary: 3  MTH FU--D/T---STC   Vital Signs:  Patient profile:   70 year old male Height:      63 inches (160.02 cm) Weight:      246.25 pounds (111.93 kg) O2 Sat:      93 % on Room air Temp:     97.1 degrees F (36.17 degrees C) oral Pulse rate:   73 / minute BP sitting:   110 / 60  (left arm) Cuff size:   large  Vitals Entered By: Josph Macho RMA (May 01, 2010 11:07 AM)  O2 Flow:  Room air CC: 3 month follow up/ CF Is Patient Diabetic? Yes   Referring Provider:  Romero Belling Primary Provider:  Minus Breeding MD  CC:  3 month follow up/ CF.  History of Present Illness: no cbg record, but states cbg's are highest in am (higher than at hs, despite no hs-snack), but is in general well-controlled. anemia was noted in the past..  he denies brbpr.  he takes fe 1/day.  Current Medications (verified): 1)  Metoclopramide Hcl 10 Mg  Tabs (Metoclopramide Hcl) .... Take 1 By Mouth Qid 2)  Omeprazole 20 Mg  Tbec (Omeprazole) .... Take 1 By Mouth Qd 3)  Taztia Xt 300 Mg  Cp24 (Diltiazem Hcl Er Beads) .... Take 1 By Mouth Qd 4)  Bisoprolol Fumarate 10 Mg Tabs (Bisoprolol Fumarate) .... One Tab By Mouth Once Daily 5)  Calcitriol 0.5 Mcg  Caps (Calcitriol) .... Take 1 By Mouth Qd 6)  Folbic 2.5-25-2 Mg  Tabs (Fa-Pyridoxine-Cyancobalamin) .... Take 1 By Mouth Qd 7)  Isosorbide Mononitrate Cr 60 Mg Xr24h-Tab (Isosorbide Mononitrate) .... One Tab By Mouth Once Daily 8)  Hydrocodone-Acetaminophen 5-500 Mg  Tabs (Hydrocodone-Acetaminophen) .... Take 1-2 Q 4 Hours Prn 9)  Trazodone Hcl 100 Mg  Tabs (Trazodone Hcl) .... Qhs 10)  Accu-Chek Compact Test Drum  Strp (Glucose Blood) .... Qid, Variable Glucoses, and Lancets 250.03 11)  Allopurinol 100 Mg Tabs (Allopurinol) .... Take 1 Tablet By Mouth Once A Day 12)  Klor-Con M20 20 Meq  Tbcr (Potassium Chloride Crys Cr) .... Take 2 By Mouth Two Times A Day 13)  Combivent 103-18 Mcg/act  Aero (Ipratropium-Albuterol) .... Take 2 Puffs Qid Prn 14)  Bd  Insulin Syringe 29g X 1/2" 1 Ml  Misc (Insulin Syringe-Needle U-100) .... Qid 15)  Vytorin 10-80 Mg  Tabs (Ezetimibe-Simvastatin) .Marland Kitchen.. 1 Qhs 16)  Wheelchair .Marland Kitchen.. 428.0 17)  Shower Chair .... 715.90 18)  Triamcinolone Acetonide 0.025 % Crea (Triamcinolone Acetonide) .... Apply Three Times A Day On Affected Area As Needed 19)  Furosemide 80 Mg Tabs (Furosemide) .... Take 1 Q Am and 1/2 Pm 20)  Nebulizer 21)  Symbicort 160-4.5 Mcg/act  Aero (Budesonide-Formoterol Fumarate) .... Two Puffs Twice Daily 22)  Spiriva Handihaler 18 Mcg Caps (Tiotropium Bromide Monohydrate) .... Inhale Contents of 1 Capsule Once A Day 23)  Spironolactone 25 Mg Tabs (Spironolactone) .... Take 1 By Mouth Qd 24)  Adult Aspirin Low Strength 81 Mg Tbdp (Aspirin) .... Take 1 By Mouth Qd 25)  Novolog 100 Unit/ml Soln (Insulin Aspart) .... 4 Times A Day (Qac) 50-30-65-10units 26)  Novolin N 100 Unit/ml Susp (Insulin Isophane Human) .... 70 Units Qhs 27)  Nitrolingual 0.4 Mg/spray Soln (Nitroglycerin) .... As Directed As Needed 28)  Ferrous Sulfate 325 (65 Fe) Mg Tbec (Ferrous Sulfate) .... Take 1 Three Times A Day 29)  Astepro 0.15 % Soln (Azelastine Hcl) .... 2 Sprays Each Nostril Each Am 30)  Flomax 0.4 Mg Xr24h-Cap (Tamsulosin Hcl) .Marland Kitchen.. 1 Tab At Bedtime 31)  02 .... 2 Liters Qhs 32)  Robitussin Dm 100-10 Mg/17ml Syrp (Dextromethorphan-Guaifenesin) .... As Directed 33)  Zaroxolyn 2.5 Mg Tabs (Metolazone) .Marland Kitchen.. 1 Tab As Directed 34)  Vitamin D 1000 Unit Tabs (Cholecalciferol) .... Take 1 Tablet By Mouth Once A Day  Allergies (verified): 1)  ! 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 Hyperlipidemia  DM Neuropathy impotence Idiopathic Peripheral vascular disease Renal insufficiency Osteoarthritis CVA Cough due to Zestril  Current Problems:  LONG TERM USE OF ASPIRIN (ICD-V58.66) LONG TERM  ANTIPLATELET THERAPY (ICD-V58.63) HYPERTENSION, BENIGN (ICD-401.1) DIASTOLIC HEART FAILURE, CHRONIC (ICD-428.32) CAD, NATIVE VESSEL (ICD-414.01) DYSPNEA (ICD-786.05) EMPHYSEMA (ICD-492.8) EDEMA (ICD-782.3) COUGH (ICD-786.2) SPECIAL SCREENING MALIGNANT NEOPLASM OF PROSTATE (ICD-V76.44) CONJUNCTIVITIS, RIGHT (ICD-372.30) HYPOKALEMIA (ICD-276.8) CHF (ICD-428.0) OSTEOPOROSIS (ICD-733.00) HYPERTENSION (ICD-401.9) 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    Physical Exam  General:  obese.  in wheelchair.  no distress. Pulses:  dorsalis pedis intact bilat Extremities:  no deformity.  no ulcer on the feet.  feet are of normal color and temp.  no edema  Neurologic:  cn 2-12 grossly intact.   readily moves all 4's.   sensation is intact to touch on the feet  Additional Exam:  Hemoglobin           [L]  11.7 g/dL                   16.1-09.6   Hematocrit           [L]  34.3 %    Iron Saturation           25.3 %                      20.0-50.0 Hemoglobin A1C       [H]  7.3 %   Impression & Recommendations:  Problem # 1:  DIABETES MELLITUS, TYPE I (ICD-250.01) needs increased rx  Problem # 2:  ANEMIA, IRON DEFICIENCY (ICD-280.9)  Other Orders: TLB-CBC Platelet - w/Differential (85025-CBCD) TLB-IBC Pnl (Iron/FE;Transferrin) (83550-IBC) TLB-A1C / Hgb A1C (Glycohemoglobin) (83036-A1C)  Patient Instructions: 1)  pending the test results, please continue the same medications for  now. 2)  tests are being ordered for you today.  a few days after the test(s), please call [phone] to hear your test results. 3)  Please schedule a follow-up appointment in 3 months. 4)  [facility] 5)  (update: i left message on phone-tree: same fe tabs.  check cbg's to see when it is high, so we can increase insulin).

## 2011-01-23 NOTE — Letter (Signed)
Summary: [facility] Office Note   [facility] Office Note   Imported By: Roderic Ovens 06/07/2010 16:39:27  _____________________________________________________________________  External Attachment:    Type:   Image     Comment:   External Document

## 2011-01-23 NOTE — Letter (Signed)
Summary: [facility]  [facility]   Imported By: Sherian Rein 07/25/2010 14:43:50  _____________________________________________________________________  External Attachment:    Type:   Image     Comment:   External Document

## 2011-01-23 NOTE — Assessment & Plan Note (Signed)
Summary: 6 month rov./sl  Medications Added TAZTIA XT 240 MG XR24H-CAP (DILTIAZEM HCL ER BEADS) take one tablet by mouth daily      Allergies Added:   Referring Provider:  Romero Belling Primary Provider:  Minus Breeding MD   History of Present Illness: The patient is 70 years old and returns for management of CAD and CHF. He has had multiple prior PCI procedures. He has chronic diastolic heart failure. He has multiple other problems including cranial chronic renal insufficiency, obesity, diabetes, hypertension, hyperlipidemia and COPD.  He was having some increased chest pain in January but his Imdur was increased and this improved. He says he now has occasional chest tightness but hasn't taken nitroglycerin in over a month.  He is considering gastric bypass surgery. He went to the [facility] and they said he was a candidate. He was told that in his situation he would do better with the gastric bypass procedure rather than a banding procedure.  Current Medications (verified): 1)  Metoclopramide Hcl 10 Mg  Tabs (Metoclopramide Hcl) .... Take 1 By Mouth Qid 2)  Omeprazole 20 Mg  Tbec (Omeprazole) .... Take 1 By Mouth Qd 3)  Taztia Xt 300 Mg  Cp24 (Diltiazem Hcl Er Beads) .... Take 1 By Mouth Qd 4)  Bisoprolol Fumarate 10 Mg Tabs (Bisoprolol Fumarate) .... One Tab By Mouth Once Daily 5)  Calcitriol 0.5 Mcg  Caps (Calcitriol) .... Take 1 By Mouth Qd 6)  Folbic 2.5-25-2 Mg  Tabs (Fa-Pyridoxine-Cyancobalamin) .... Take 1 By Mouth Qd 7)  Isosorbide Mononitrate Cr 60 Mg Xr24h-Tab (Isosorbide Mononitrate) .... One Tab By Mouth Once Daily 8)  Hydrocodone-Acetaminophen 5-500 Mg  Tabs (Hydrocodone-Acetaminophen) .... Take 1-2 Q 4 Hours Prn 9)  Trazodone Hcl 100 Mg  Tabs (Trazodone Hcl) .... Qhs 10)  Accu-Chek Compact Test Drum  Strp (Glucose Blood) .... Qid, Variable Glucoses, and Lancets 250.03 11)  Allopurinol 100 Mg Tabs (Allopurinol) .... Take 1 Tablet By Mouth Once A Day 12)  Klor-Con M20 20  Meq  Tbcr (Potassium Chloride Crys Cr) .... Take 2 By Mouth Two Times A Day 13)  Combivent 103-18 Mcg/act  Aero (Ipratropium-Albuterol) .... Take 2 Puffs Qid Prn 14)  Bd Insulin Syringe 29g X 1/2" 1 Ml  Misc (Insulin Syringe-Needle U-100) .... Qid 15)  Vytorin 10-80 Mg  Tabs (Ezetimibe-Simvastatin) .Marland Kitchen.. 1 Qhs 16)  Wheelchair .Marland Kitchen.. 428.0 17)  Shower Chair .... 715.90 18)  Triamcinolone Acetonide 0.025 % Crea (Triamcinolone Acetonide) .... Apply Three Times A Day On Affected Area As Needed 19)  Furosemide 80 Mg Tabs (Furosemide) .... Take 1 Q Am and 1/2 Pm 20)  Nebulizer 21)  Symbicort 160-4.5 Mcg/act  Aero (Budesonide-Formoterol Fumarate) .... Two Puffs Twice Daily 22)  Spiriva Handihaler 18 Mcg Caps (Tiotropium Bromide Monohydrate) .... Inhale Contents of 1 Capsule Once A Day 23)  Spironolactone 25 Mg Tabs (Spironolactone) .... Take 1 By Mouth Qd 24)  Adult Aspirin Low Strength 81 Mg Tbdp (Aspirin) .... Take 1 By Mouth Qd 25)  Novolog 100 Unit/ml Soln (Insulin Aspart) .... 4 Times A Day (Qac) 50-30-65-10units 26)  Novolin N 100 Unit/ml Susp (Insulin Isophane Human) .... 70 Units Qhs 27)  Nitrolingual 0.4 Mg/spray Soln (Nitroglycerin) .... As Directed As Needed 28)  Ferrous Sulfate 325 (65 Fe) Mg Tbec (Ferrous Sulfate) .... Take 1 Three Times A Day 29)  Astepro 0.15 % Soln (Azelastine Hcl) .... 2 Sprays Each Nostril Each Am 30)  Flomax 0.4 Mg Xr24h-Cap (Tamsulosin Hcl) .Marland Kitchen.. 1 Tab  At Bedtime 31)  02 .... 2 Liters Qhs 32)  Robitussin Dm 100-10 Mg/53ml Syrp (Dextromethorphan-Guaifenesin) .... As Directed 33)  Zaroxolyn 2.5 Mg Tabs (Metolazone) .Marland Kitchen.. 1 Tab As Directed 34)  Vitamin D 1000 Unit Tabs (Cholecalciferol) .... Take 1 Tablet By Mouth Once A Day  Allergies (verified): 1)  ! * Shellfish 2)  Enalapril Maleate  Past History:  Past Medical History: Reviewed history from 02/02/2009 and no changes required. 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 Hyperlipidemia  DM  Neuropathy impotence Idiopathic Peripheral vascular disease Renal insufficiency Osteoarthritis CVA Cough due to Zestril  Current Problems:  LONG TERM USE OF ASPIRIN (ICD-V58.66) LONG TERM ANTIPLATELET THERAPY (ICD-V58.63) HYPERTENSION, BENIGN (ICD-401.1) DIASTOLIC HEART FAILURE, CHRONIC (ICD-428.32) CAD, NATIVE VESSEL (ICD-414.01) DYSPNEA (ICD-786.05) EMPHYSEMA (ICD-492.8) EDEMA (ICD-782.3) COUGH (ICD-786.2) SPECIAL SCREENING MALIGNANT NEOPLASM OF PROSTATE (ICD-V76.44) CONJUNCTIVITIS, RIGHT (ICD-372.30) HYPOKALEMIA (ICD-276.8) CHF (ICD-428.0) OSTEOPOROSIS (ICD-733.00) HYPERTENSION (ICD-401.9) 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   Vital Signs:  Patient profile:   70 year old male Height:      63 inches Weight:      242 pounds BMI:     43.02 Pulse rate:   77 / minute Pulse rhythm:   regular BP sitting:   99 / 60  (left arm) Cuff size:   large  Vitals Entered By: Burnett Kanaris, CNA (June 09, 2010 3:11 PM)  Physical Exam  Additional Exam:  Gen. Well-nourished, in no distress   Neck: No JVD, thyroid not enlarged, no carotid bruits Lungs: No tachypnea, clear without rales, rhonchi or wheezes Cardiovascular: Rhythm regular, PMI not displaced,  heart sounds  normal, no murmurs or gallops, trace peripheral edema, pulses normal in all 4 extremities. Abdomen: BS normal, abdomen soft and non-tender without masses or organomegaly, no  hepatosplenomegaly. MS: No deformities, no cyanosis or clubbing   Neuro:  No focal sns   Skin:  no lesions    Impression & Recommendations:  Problem # 1:  DIASTOLIC HEART FAILURE, CHRONIC (ICD-428.32) He appears euvolemic today. He appears compensated. His updated medication list for this problem includes:    Taztia Xt 240 Mg Xr24h-cap (Diltiazem hcl er beads) .Marland Kitchen... Take one tablet by mouth daily    Bisoprolol Fumarate 10 Mg Tabs (Bisoprolol fumarate) ..... One tab by mouth once daily    Isosorbide Mononitrate Cr 60 Mg Xr24h-tab (Isosorbide mononitrate) ..... One tab by mouth once daily    Furosemide 80 Mg Tabs (Furosemide) .Marland Kitchen... Take 1 q am and 1/2 pm    Spironolactone 25 Mg Tabs (Spironolactone) .Marland Kitchen... Take 1 by mouth qd    Adult Aspirin Low Strength 81 Mg Tbdp (Aspirin) .Marland Kitchen... Take 1 by mouth qd    Nitrolingual 0.4 Mg/spray Soln (Nitroglycerin) .Marland Kitchen... As directed as needed    Zaroxolyn 2.5 Mg Tabs (Metolazone) .Marland Kitchen... 1 tab as directed  Orders: EKG w/ Interpretation (93000)  Problem # 2:  CAD, NATIVE VESSEL (ICD-414.01) He has had multiple prior PCI procedures. He is having some occasional chest pain. We will continue medical therapy. If the symptoms increase then we will consider further evaluation. His updated medication list for this problem includes:    [place]  Xt 240 Mg Xr24h-cap (Diltiazem hcl er beads) .Marland Kitchen... Take one tablet by mouth daily    Bisoprolol Fumarate 10 Mg Tabs (Bisoprolol fumarate) ..... One tab by mouth once daily    Isosorbide Mononitrate Cr 60 Mg Xr24h-tab (Isosorbide mononitrate) ..... One tab by mouth once daily    Adult Aspirin Low Strength 81 Mg Tbdp (Aspirin) .Marland Kitchen... Take 1 by mouth qd    Nitrolingual 0.4 Mg/spray Soln (Nitroglycerin) .Marland Kitchen... As directed as needed  Problem # 3:  HYPERTENSION, BENIGN (ICD-401.1) The blood pressure is somewhat low. We will decrease the diltiazem to 240 mg daily. His updated medication list for this problem includes:    Taztia Xt  240 Mg Xr24h-cap (Diltiazem hcl er beads) .Marland Kitchen... Take one tablet by mouth daily    Bisoprolol Fumarate 10 Mg Tabs (Bisoprolol fumarate) ..... One tab by mouth once daily    Furosemide 80 Mg Tabs (Furosemide) .Marland Kitchen... Take 1 q am and 1/2 pm    Spironolactone 25 Mg Tabs (Spironolactone) .Marland Kitchen... Take 1 by mouth qd    Adult Aspirin Low Strength 81 Mg Tbdp (Aspirin) .Marland Kitchen... Take 1 by mouth qd    Zaroxolyn 2.5 Mg Tabs (Metolazone) .Marland Kitchen... 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 I encouraged him to consider this.  Patient Instructions: 1)  Your physician recommends that you schedule a follow-up appointment in: 6 months with Dr. [name]. The office will mail you a reminder letter 2 months prior appointment date. 2)  Your physician has recommended you make the following change in your medication: Taztia 240 mg take one tablet daily. Prescriptions: TAZTIA XT 240 MG XR24H-CAP (DILTIAZEM HCL ER BEADS) take one tablet by mouth daily  #90 x 4   Entered by:   Ollen Gross, RN, BSN   Authorized by:   Lenoria Farrier, MD, [facility]   Signed by:   Ollen Gross, RN, BSN on 06/09/2010   Method used:   Electronically to        [facility] [address] #[id number]* (retail)       [address] [place], [place]  [address]       Ph: [phone]       Fax: [phone]   RxID:   [id number]

## 2011-01-23 NOTE — Letter (Addendum)
Summary: [facility]   Imported By: Lester [place] 10/06/2010 10:49:34  _____________________________________________________________________  External Attachment:    Type:   Image     Comment:   External Document

## 2011-01-23 NOTE — Letter (Signed)
Summary: Weight Loss Form/[facility]  [employer] Loss Form/[facility]   Imported By: Sherian Rein 10/23/2010 11:03:01  _____________________________________________________________________  External Attachment:    Type:   Image     Comment:   External Document

## 2011-01-23 NOTE — Assessment & Plan Note (Signed)
Summary: rov for emphysema   Visit Type:  Follow-up Copy to:  Romero Belling Primary Provider/Referring Provider:  Minus Breeding MD  CC:  6 month follow up. pt wstates he has "good" days and "bad" days. pt c/o cough with very little phlem and post nasal drip.Marland Kitchen  History of Present Illness: the pt comes in today for f/u of his known emphysema.  He is on a good bronchodilator regimen, but is having a hard time affording his medications.  He can get his meds from the [place], but tells me they don't have available his current meds?  He feels that he is near his usual baseline, and denies any recent acute exacerbations or pulmonary infections.    Current Medications (verified): 1)  Omeprazole 20 Mg  Tbec (Omeprazole) .... Take 1 By Mouth Qd 2)  Bisoprolol Fumarate 10 Mg Tabs (Bisoprolol Fumarate) .... One Tab By Mouth Once Daily 3)  Folbic 2.5-25-2 Mg  Tabs (Fa-Pyridoxine-Cyancobalamin) .... Take 1 By Mouth Qd 4)  Isosorbide Mononitrate Cr 60 Mg Xr24h-Tab (Isosorbide Mononitrate) .... One Tab By Mouth Once Daily 5)  Hydrocodone-Acetaminophen 5-500 Mg  Tabs (Hydrocodone-Acetaminophen) .... Take 1-2 Q 4 Hours Prn 6)  Trazodone Hcl 100 Mg  Tabs (Trazodone Hcl) .... Qhs 7)  Accu-Chek Compact Test Drum  Strp (Glucose Blood) .... Qid, Variable Glucoses, and Lancets 250.03 8)  Klor-Con M20 20 Meq  Tbcr (Potassium Chloride Crys Cr) .... Take 2 By Mouth Two Times A Day 9)  Combivent 103-18 Mcg/act  Aero (Ipratropium-Albuterol) .... Take 2 Puffs Qid Prn 10)  Bd Insulin Syringe 29g X 1/2" 1 Ml  Misc (Insulin Syringe-Needle U-100) .... Qid 11)  Triamcinolone Acetonide 0.025 % Crea (Triamcinolone Acetonide) .... Apply Three Times A Day On Affected Area As Needed 12)  Furosemide 80 Mg Tabs (Furosemide) .... Take 1 Q Am and 1/2 Pm 13)  Nebulizer 14)  Symbicort 160-4.5 Mcg/act  Aero (Budesonide-Formoterol Fumarate) .... Two Puffs Twice Daily As Needed 15)  Spiriva Handihaler 18 Mcg Caps (Tiotropium Bromide  Monohydrate) .... Inhale Contents of 1 Capsule Once A Day As Needed 16)  Spironolactone 25 Mg Tabs (Spironolactone) .... Take 1 By Mouth Qd 17)  Adult Aspirin Low Strength 81 Mg Tbdp (Aspirin) .... Take 1 By Mouth Qd 18)  Novolog 100 Unit/ml Soln (Insulin Aspart) .... 4 Times A Day (Qac) 50-30-65-10units 19)  Novolin N 100 Unit/ml Susp (Insulin Isophane Human) .... 55 Units Qhs 20)  Nitrolingual 0.4 Mg/spray Soln (Nitroglycerin) .... As Directed As Needed 21)  Ferrous Sulfate 325 (65 Fe) Mg Tbec (Ferrous Sulfate) .... Take 1 Three Times A Day 22)  Astepro 0.15 % Soln (Azelastine Hcl) .... 2 Sprays Each Nostril Each Am As Needed 23)  Flomax 0.4 Mg Xr24h-Cap (Tamsulosin Hcl) .Marland Kitchen.. 1 Tab At Bedtime 24)  02 .... 2 Liters Qhs 25)  Robitussin Dm 100-10 Mg/61ml Syrp (Dextromethorphan-Guaifenesin) .... As Directed 26)  Zaroxolyn 2.5 Mg Tabs (Metolazone) .Marland Kitchen.. 1 Tab As Directed 27)  Allopurinol 300 Mg Tabs (Allopurinol) .Marland Kitchen.. 1 Once Daily 28)  Taztia Xt 180 Mg Xr24h-Cap (Diltiazem Hcl Er Beads) .Marland Kitchen.. 1 Tab Once Daily 29)  Calcitriol 0.25 Mcg Caps (Calcitriol) .Marland Kitchen.. 1 Tab Once Daily 30)  Crestor 10 Mg Tabs (Rosuvastatin Calcium) .Marland Kitchen.. 1 Tab Once Daily  Allergies: 1)  ! * Shellfish 2)  Enalapril Maleate  Social History: pt is married with children. Patient states former smoker. quit 1999. 2 ppd. started age 61 pt is retired. previously worked at [employer].  Review of  Systems       The patient complains of shortness of breath with activity, productive cough, difficulty swallowing, sore throat, nasal congestion/difficulty breathing through nose, hand/feet swelling, and joint stiffness or pain.  The patient denies shortness of breath at rest, non-productive cough, coughing up blood, chest pain, irregular heartbeats, acid heartburn, indigestion, loss of appetite, weight change, abdominal pain, tooth/dental problems, headaches, sneezing, itching, ear ache, anxiety, depression, rash, change in color of mucus, and  fever.    Vital Signs:  Patient profile:   70 year old male Height:      63 inches Weight:      240.50 pounds O2 Sat:      96 % on Room air Temp:     98.6 degrees F oral Pulse rate:   66 / minute BP sitting:   122 / 74  (left arm) Cuff size:   large  Vitals Entered By: Carver Fila (October 25, 2010 11:19 AM)  O2 Flow:  Room air CC: 6 month follow up. pt wstates he has "good" days and "bad" days. pt c/o cough with very little phlem, post nasal drip. Comments meds and allergies updated Phone number updated Carver Fila  October 25, 2010 11:20 AM    Physical Exam  General:  obese male in nad Lungs:  mild crackles in the bases, no wheezing or rhonchi Heart:  rrr, no mrg Extremities:  no edema or cyanosis noted. Neurologic:  alert and oriented, moves all 4.   Impression & Recommendations:  Problem # 1:  COPD (ICD-496) the pt appears to be at a stable baseline wrt his lung disease and pulmonary symptoms.  He is having trouble affording his medications, and will work with him on pt assistance programs and seeing what the [place] may have available.  He is using combivent for rescue, and this is very expensive and redundant if he is using spiriva daily.  Will change this to albuterol alone.  I have also asked him to work on weight loss and some type of conditioning program.  Medications Added to Medication List This Visit: 1)  Symbicort 160-4.5 Mcg/act Aero (Budesonide-formoterol fumarate) .... Two puffs twice daily as needed 2)  Spiriva Handihaler 18 Mcg Caps (Tiotropium bromide monohydrate) .... Inhale contents of 1 capsule once a day as needed 3)  Astepro 0.15 % Soln (Azelastine hcl) .... 2 sprays each nostril each am as needed  Other Orders: Est. Patient Level III (65784) Misc. Referral (Misc. Ref)  Patient Instructions: 1)  stop combivent, and replace with ventolin 2 puffs every 6 hrs only if needed for rescue 2)  will see if we can get you some assistance with your inhaler  meds. 3)  [facility] 4)  work on weight loss 5)  followup with me in 6mos.   Immunization History:  Influenza Immunization History:    Influenza:  historical (08/24/2010)

## 2011-01-23 NOTE — Progress Notes (Signed)
Summary: Weight Loss form/[facility]  Weight Loss form/[facility]   Imported By: Sherian Rein 09/20/2010 09:14:29  _____________________________________________________________________  External Attachment:    Type:   Image     Comment:   External Document

## 2011-01-25 ENCOUNTER — Telehealth (INDEPENDENT_AMBULATORY_CARE_PROVIDER_SITE_OTHER): Payer: Self-pay | Admitting: *Deleted

## 2011-01-25 NOTE — Letter (Signed)
Summary: Weight Loss Documentation  Weight Loss Documentation   Imported By: Lester  12/29/2010 10:53:29  _____________________________________________________________________  External Attachment:    Type:   Image     Comment:   External Document

## 2011-01-25 NOTE — Assessment & Plan Note (Signed)
Summary: 6 month rov    Referring Provider:  Romero Belling Primary Provider:  Minus Breeding MD   History of Present Illness: The patient is 70 years old and returns for management of CAD and CHF. He has had multiple prior PCI procedures. He has chronic diastolic heart failure. He has multiple other problems including  chronic renal insufficiency, obesity, diabetes, hypertension, hyperlipidemia and COPD.  He has been doing well since his last visit with only rare nitroglycerin about one time a month, no increased shortness of breath, and no palpitations.  He is planning a lap band surgery for weight loss next March with Dr. Reesa Chew. He will need preoperative evaluation prior to that with a Myoview scan.  Current Medications (verified): 1)  Omeprazole 20 Mg  Tbec (Omeprazole) .... Take 1 By Mouth Qd 2)  Bisoprolol Fumarate 10 Mg Tabs (Bisoprolol Fumarate) .... One Tab By Mouth Once Daily 3)  Folbic 2.5-25-2 Mg  Tabs (Fa-Pyridoxine-Cyancobalamin) .... Take 1 By Mouth Qd 4)  Isosorbide Mononitrate Cr 60 Mg Xr24h-Tab (Isosorbide Mononitrate) .... One Tab By Mouth Once Daily 5)  Hydrocodone-Acetaminophen 5-500 Mg  Tabs (Hydrocodone-Acetaminophen) .... Take 1-2 Q 4 Hours Prn 6)  Trazodone Hcl 100 Mg  Tabs (Trazodone Hcl) .... Qhs 7)  Accu-Chek Compact Test Drum  Strp (Glucose Blood) .... Qid, Variable Glucoses, and Lancets 250.03 8)  Klor-Con M20 20 Meq  Tbcr (Potassium Chloride Crys Cr) .... Take 2 By Mouth Two Times A Day 9)  Bd Insulin Syringe 29g X 1/2" 1 Ml  Misc (Insulin Syringe-Needle U-100) .... Qid 10)  Triamcinolone Acetonide 0.025 % Crea (Triamcinolone Acetonide) .... Apply Three Times A Day On Affected Area As Needed 11)  Furosemide 80 Mg Tabs (Furosemide) .... Take 1 Q Am and 1/2 Pm 12)  Nebulizer 13)  Symbicort 160-4.5 Mcg/act  Aero (Budesonide-Formoterol Fumarate) .... Two Puffs Twice Daily As Needed 14)  Spiriva Handihaler 18 Mcg Caps (Tiotropium Bromide Monohydrate) .... Inhale  Contents of 1 Capsule Once A Day As Needed 15)  Spironolactone 25 Mg Tabs (Spironolactone) .... Take 1 By Mouth Qd 16)  Adult Aspirin Low Strength 81 Mg Tbdp (Aspirin) .... Take 1 By Mouth Qd 17)  Novolog 100 Unit/ml Soln (Insulin Aspart) .... 4 Times A Day (Qac) 50-30-65-10units 18)  Novolin N 100 Unit/ml Susp (Insulin Isophane Human) .... 55 Units Qhs 19)  Nitrolingual 0.4 Mg/spray Soln (Nitroglycerin) .... As Directed As Needed 20)  Ferrous Sulfate 325 (65 Fe) Mg Tbec (Ferrous Sulfate) .... Take 1 Three Times A Day 21)  Astepro 0.15 % Soln (Azelastine Hcl) .... 2 Sprays Each Nostril Each Am As Needed 22)  Flomax 0.4 Mg Xr24h-Cap (Tamsulosin Hcl) .Marland Kitchen.. 1 Tab At Bedtime 23)  02 .... 2 Liters Qhs 24)  Robitussin Dm 100-10 Mg/5ml Syrp (Dextromethorphan-Guaifenesin) .... As Directed 25)  Zaroxolyn 2.5 Mg Tabs (Metolazone) .Marland Kitchen.. 1 Tab As Directed 26)  Allopurinol 300 Mg Tabs (Allopurinol) .Marland Kitchen.. 1 Once Daily 27)  Taztia Xt 180 Mg Xr24h-Cap (Diltiazem Hcl Er Beads) .Marland Kitchen.. 1 Tab Once Daily 28)  Calcitriol 0.25 Mcg Caps (Calcitriol) .Marland Kitchen.. 1 Tab Once Daily 29)  Crestor 10 Mg Tabs (Rosuvastatin Calcium) .Marland Kitchen.. 1 Tab Once Daily  Allergies: 1)  ! * Shellfish 2)  Enalapril Maleate  Past History:  Past Medical History: Reviewed history from 02/02/2009 and no changes required. 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 Hyperlipidemia  DM Neuropathy impotence Idiopathic Peripheral vascular disease Renal insufficiency Osteoarthritis CVA Cough due to Zestril  Current  Problems:  LONG TERM USE OF ASPIRIN (ICD-V58.66) LONG TERM ANTIPLATELET THERAPY (ICD-V58.63) HYPERTENSION, BENIGN (ICD-401.1) DIASTOLIC HEART FAILURE, CHRONIC (ICD-428.32) CAD, NATIVE VESSEL (ICD-414.01) DYSPNEA (ICD-786.05) EMPHYSEMA (ICD-492.8) EDEMA (ICD-782.3) COUGH (ICD-786.2) SPECIAL SCREENING MALIGNANT NEOPLASM OF PROSTATE (ICD-V76.44) CONJUNCTIVITIS, RIGHT (ICD-372.30) HYPOKALEMIA (ICD-276.8) CHF  (ICD-428.0) OSTEOPOROSIS (ICD-733.00) HYPERTENSION (ICD-401.9) 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   Vital Signs:  Patient profile:   70 year old male Height:      63 inches Weight:      237 pounds BMI:     42.13 Pulse rate:   69 / minute BP sitting:   100 / 60  (left arm)  Vitals Entered By: Laurance Flatten CMA (December 14, 2010 11:54 AM)  Physical Exam  Additional Exam:  Gen. Well-nourished, in no distress   Neck: No JVD, thyroid not enlarged, no carotid bruits Lungs: No tachypnea, clear without rales, rhonchi or wheezes Cardiovascular: Rhythm regular, PMI not displaced,  heart sounds  normal, no murmurs or gallops, trace peripheral edema, pulses normal in all 4 extremities. Abdomen: BS normal, abdomen soft and non-tender without masses or organomegaly, no hepatosplenomegaly. MS: No deformities, no cyanosis or clubbing   Neuro:  No focal sns   Skin:  no lesions    Impression & Recommendations:  Problem # 1:  CAD, NATIVE VESSEL (ICD-414.01) He has had multiple prior PCI procedures. He has had only occasional chest pain treated with nitroglycerin. This problem appears stable.  His updated medication list for this problem includes:    Bisoprolol Fumarate 10 Mg Tabs (Bisoprolol fumarate) ..... One tab by mouth once daily    Isosorbide Mononitrate Cr 60 Mg Xr24h-tab (Isosorbide mononitrate) ..... One tab by mouth once daily    Adult Aspirin Low Strength 81 Mg  Tbdp (Aspirin) .Marland Kitchen... Take 1 by mouth qd    Nitrolingual 0.4 Mg/spray Soln (Nitroglycerin) .Marland Kitchen... As directed as needed    Taztia Xt 180 Mg Xr24h-cap (Diltiazem hcl er beads) .Marland Kitchen... 1 tab once daily  Orders: TLB-BMP (Basic Metabolic Panel-BMET) (80048-METABOL) TLB-CBC Platelet - w/Differential (85025-CBCD) Nuclear Stress Test (Nuc Stress Test)  Problem # 2:  DIASTOLIC HEART FAILURE, CHRONIC (ICD-428.32) He has a history of diastolic heart failure. He appears you and make today with only trace peripheral edema. We will continue his current medications. We'll get a BMP. His updated medication list for this problem includes:    Bisoprolol Fumarate 10 Mg Tabs (Bisoprolol fumarate) ..... One tab by mouth once daily    Isosorbide Mononitrate Cr 60 Mg Xr24h-tab (Isosorbide mononitrate) ..... One tab by mouth once daily    Furosemide 80 Mg Tabs (Furosemide) .Marland Kitchen... Take 1 q am and 1/2 pm    Spironolactone 25 Mg Tabs (Spironolactone) .Marland Kitchen... Take 1 by mouth qd    Adult Aspirin Low Strength 81 Mg Tbdp (Aspirin) .Marland Kitchen... Take 1 by mouth qd    Nitrolingual 0.4 Mg/spray Soln (Nitroglycerin) .Marland Kitchen... As directed as needed    Zaroxolyn 2.5 Mg Tabs (Metolazone) .Marland Kitchen... 1 tab as directed    Taztia Xt 180 Mg Xr24h-cap (Diltiazem hcl er beads) .Marland Kitchen... 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 He will need a Myoview scan for cardiac clearance prior to surgery. We will schedule this in late February with a followup visit with Dr. Clifton James after the Myoview scan.  Problem # 4:  HYPERTENSION, BENIGN (ICD-401.1) This is well controlled on current medications.  His updated medication list for this problem includes:    Bisoprolol Fumarate 10 Mg Tabs (Bisoprolol fumarate) ..... One tab by mouth once daily    Furosemide 80 Mg Tabs (Furosemide) .Marland Kitchen... Take 1 q am and 1/2 pm    Spironolactone 25 Mg Tabs (Spironolactone) .Marland Kitchen... Take 1 by mouth qd     Adult Aspirin Low Strength 81 Mg Tbdp (Aspirin) .Marland Kitchen... Take 1 by mouth qd    Zaroxolyn 2.5 Mg Tabs (Metolazone) .Marland Kitchen... 1 tab as directed    Taztia Xt 180 Mg Xr24h-cap (Diltiazem hcl er beads) .Marland Kitchen... 1 tab once daily  Patient Instructions: 1)  Your physician has requested that you have a Lexiscan myoview in late February 2012.  For further information please visit [email or web address]  Please follow instruction sheet, as given. 2)  Your physician recommends that you schedule a follow-up appointment in: early March 2012 with Dr. Clifton James. 3)  Labwork today: bmet/cbc (414.01;402.10;427.31)

## 2011-01-25 NOTE — Assessment & Plan Note (Signed)
Summary: 5 WK FU  STC   Vital Signs:  Patient profile:   70 year old male Height:      63 inches (160.02 cm) Weight:      239.13 pounds (108.70 kg) BMI:     42.51 O2 Sat:      93 % on Room air Temp:     98.7 degrees F (37.06 degrees C) oral Pulse rate:   69 / minute Pulse rhythm:   regular BP sitting:   112 / 62  (left arm) Cuff size:   large  Vitals Entered By: Brenton Grills CMA Duncan Dull) (December 27, 2010 11:45 AM)  O2 Flow:  Room air CC: Follow-up visit/aj Is Patient Diabetic? Yes   Referring Provider:  Romero Belling Primary Provider:  Minus Breeding MD  CC:  Follow-up visit/aj.  History of Present Illness: no cbg record, but states cbg's are mostly well-controlled, except slightly high at hs.  pt states he feels well in general.  Current Medications (verified): 1)  Omeprazole 20 Mg  Tbec (Omeprazole) .... Take 1 By Mouth Qd 2)  Bisoprolol Fumarate 10 Mg Tabs (Bisoprolol Fumarate) .... One Tab By Mouth Once Daily 3)  Folbic 2.5-25-2 Mg  Tabs (Fa-Pyridoxine-Cyancobalamin) .... Take 1 By Mouth Qd 4)  Isosorbide Mononitrate Cr 60 Mg Xr24h-Tab (Isosorbide Mononitrate) .... One Tab By Mouth Once Daily 5)  Hydrocodone-Acetaminophen 5-500 Mg  Tabs (Hydrocodone-Acetaminophen) .... Take 1-2 Q 4 Hours Prn 6)  Trazodone Hcl 100 Mg  Tabs (Trazodone Hcl) .... Qhs 7)  Accu-Chek Compact Test Drum  Strp (Glucose Blood) .... Qid, Variable Glucoses, and Lancets 250.03 8)  Klor-Con M20 20 Meq  Tbcr (Potassium Chloride Crys Cr) .... Take 2 By Mouth Two Times A Day 9)  Bd Insulin Syringe 29g X 1/2" 1 Ml  Misc (Insulin Syringe-Needle U-100) .... Qid 10)  Triamcinolone Acetonide 0.025 % Crea (Triamcinolone Acetonide) .... Apply Three Times A Day On Affected Area As Needed 11)  Furosemide 80 Mg Tabs (Furosemide) .... Take 1 Q Am and 1/2 Pm 12)  Nebulizer 13)  Symbicort 160-4.5 Mcg/act  Aero (Budesonide-Formoterol Fumarate) .... Two Puffs Twice Daily As Needed 14)  Spiriva Handihaler 18 Mcg Caps  (Tiotropium Bromide Monohydrate) .... Inhale Contents of 1 Capsule Once A Day As Needed 15)  Spironolactone 25 Mg Tabs (Spironolactone) .... Take 1 By Mouth Qd 16)  Adult Aspirin Low Strength 81 Mg Tbdp (Aspirin) .... Take 1 By Mouth Qd 17)  Novolog 100 Unit/ml Soln (Insulin Aspart) .... 4 Times A Day (Qac) 50-30-65-10units 18)  Novolin N 100 Unit/ml Susp (Insulin Isophane Human) .... 55 Units Qhs 19)  Nitrolingual 0.4 Mg/spray Soln (Nitroglycerin) .... As Directed As Needed 20)  Ferrous Sulfate 325 (65 Fe) Mg Tbec (Ferrous Sulfate) .... Take 1 Three Times A Day 21)  Astepro 0.15 % Soln (Azelastine Hcl) .... 2 Sprays Each Nostril Each Am As Needed 22)  Flomax 0.4 Mg Xr24h-Cap (Tamsulosin Hcl) .Marland Kitchen.. 1 Tab At Bedtime 23)  02 .... 2 Liters Qhs 24)  Robitussin Dm 100-10 Mg/79ml Syrp (Dextromethorphan-Guaifenesin) .... As Directed 25)  Zaroxolyn 2.5 Mg Tabs (Metolazone) .Marland Kitchen.. 1 Tab As Directed 26)  Allopurinol 300 Mg Tabs (Allopurinol) .Marland Kitchen.. 1 Once Daily 27)  Taztia Xt 180 Mg Xr24h-Cap (Diltiazem Hcl Er Beads) .Marland Kitchen.. 1 Tab Once Daily 28)  Calcitriol 0.25 Mcg Caps (Calcitriol) .Marland Kitchen.. 1 Tab Once Daily 29)  Crestor 10 Mg Tabs (Rosuvastatin Calcium) .Marland Kitchen.. 1 Tab Once Daily  Allergies (verified): 1)  ! * Shellfish  2)  Enalapril Maleate  Past History:  Past Medical History: Last updated: 02/02/2009 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 Hyperlipidemia  DM Neuropathy impotence Idiopathic Peripheral vascular disease Renal insufficiency Osteoarthritis CVA Cough due to Zestril  Current Problems:  LONG TERM USE OF ASPIRIN (ICD-V58.66) LONG TERM ANTIPLATELET THERAPY (ICD-V58.63) HYPERTENSION, BENIGN (ICD-401.1) DIASTOLIC HEART FAILURE, CHRONIC (ICD-428.32) CAD, NATIVE VESSEL (ICD-414.01) DYSPNEA (ICD-786.05) EMPHYSEMA (ICD-492.8) EDEMA (ICD-782.3) COUGH (ICD-786.2) SPECIAL SCREENING MALIGNANT NEOPLASM OF PROSTATE (ICD-V76.44) CONJUNCTIVITIS, RIGHT (ICD-372.30) HYPOKALEMIA  (ICD-276.8) CHF (ICD-428.0) OSTEOPOROSIS (ICD-733.00) HYPERTENSION (ICD-401.9) 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    Physical Exam  General:  morbidly obese.  no distress  Skin:  insulin injection sites at anterior abdomen are normal    Impression & Recommendations:  Problem # 1:  DIABETES MELLITUS, TYPE I (ICD-250.01) needs slightly increased rx  HgbA1C: 7.3 (11/21/2010)  Medications Added to Medication List This Visit: 1)  Novolog 100 Unit/ml Soln (Insulin aspart) .... 4 times a day (qac) 50-35-65-10units  Other Orders: Est. Patient Level III (16109)  Patient Instructions: 1)  increase humalog to (just before each meal), 50-35-76-10 units 2)  same nph at bedtime. 3)  Please schedule a follow-up appointment in 1 month.   Orders Added: 1)  Est. Patient Level III [60454]

## 2011-01-25 NOTE — Progress Notes (Signed)
Summary: Order for [place]  Phone Note From Other Clinic   Caller: [facility] Summary of Call: [facility] called stating that LLQ is not included in Abdominal US. Requesting a new order to include pelvis. Fax to [phone] Irena Reichmann Initial call taken by: Margaret Pyle, CMA,  January 19, 2011 1:39 PM  Follow-up for Phone Call        order faxed to [facility] at number listed above Follow-up by: Brenton Grills CMA Duncan Dull),  January 19, 2011 2:55 PM

## 2011-01-26 ENCOUNTER — Ambulatory Visit (INDEPENDENT_AMBULATORY_CARE_PROVIDER_SITE_OTHER): Payer: Medicare Other | Admitting: Endocrinology

## 2011-01-26 ENCOUNTER — Ambulatory Visit: Admit: 2011-01-26 | Payer: Self-pay | Admitting: Endocrinology

## 2011-01-26 ENCOUNTER — Encounter: Payer: Self-pay | Admitting: Endocrinology

## 2011-01-29 ENCOUNTER — Encounter: Payer: Self-pay | Admitting: Cardiology

## 2011-01-29 ENCOUNTER — Ambulatory Visit (HOSPITAL_COMMUNITY): Payer: Medicare Other | Attending: Cardiology

## 2011-01-29 DIAGNOSIS — I4949 Other premature depolarization: Secondary | ICD-10-CM

## 2011-01-29 DIAGNOSIS — I509 Heart failure, unspecified: Secondary | ICD-10-CM | POA: Insufficient documentation

## 2011-01-29 DIAGNOSIS — R079 Chest pain, unspecified: Secondary | ICD-10-CM

## 2011-01-29 DIAGNOSIS — R0602 Shortness of breath: Secondary | ICD-10-CM

## 2011-01-29 DIAGNOSIS — I2510 Atherosclerotic heart disease of native coronary artery without angina pectoris: Secondary | ICD-10-CM | POA: Insufficient documentation

## 2011-01-29 DIAGNOSIS — R0989 Other specified symptoms and signs involving the circulatory and respiratory systems: Secondary | ICD-10-CM

## 2011-01-31 NOTE — Assessment & Plan Note (Signed)
Summary: SIDE PAIN /NWS   Vital Signs:  Patient profile:   70 year old male Height:      63 inches (160.02 cm) Weight:      239 pounds (108.64 kg) BMI:     42.49 O2 Sat:      94 % on Room air Temp:     98.0 degrees F (36.67 degrees C) oral Pulse rate:   71 / minute Pulse rhythm:   regular BP sitting:   108 / 68  (left arm) Cuff size:   large  Vitals Entered By: Brenton Grills CMA Duncan Dull) (January 18, 2011 8:47 AM)  O2 Flow:  Room air CC: Pain on left side x 2 days/aj Is Patient Diabetic? Yes Pain Assessment Patient in pain? yes     Location: abdomen Onset of pain  Constant   Referring Provider:  Romero Belling Primary Provider:  Minus Breeding MD  CC:  Pain on left side x 2 days/aj.  History of Present Illness: 3 days of moderate left lower quadrant pain, but no assoc diarrhea.  he chronically takes dulcolax.  Current Medications (verified): 1)  Omeprazole 20 Mg  Tbec (Omeprazole) .... Take 1 By Mouth Qd 2)  Bisoprolol Fumarate 10 Mg Tabs (Bisoprolol Fumarate) .... One Tab By Mouth Once Daily 3)  Folbic 2.5-25-2 Mg  Tabs (Fa-Pyridoxine-Cyancobalamin) .... Take 1 By Mouth Qd 4)  Isosorbide Mononitrate Cr 60 Mg Xr24h-Tab (Isosorbide Mononitrate) .... One Tab By Mouth Once Daily 5)  Hydrocodone-Acetaminophen 5-500 Mg  Tabs (Hydrocodone-Acetaminophen) .... Take 1-2 Q 4 Hours Prn 6)  Trazodone Hcl 100 Mg  Tabs (Trazodone Hcl) .... Qhs 7)  Accu-Chek Compact Test Drum  Strp (Glucose Blood) .... Qid, Variable Glucoses, and Lancets 250.03 8)  Klor-Con M20 20 Meq  Tbcr (Potassium Chloride Crys Cr) .... Take 2 By Mouth Two Times A Day 9)  Bd Insulin Syringe 29g X 1/2" 1 Ml  Misc (Insulin Syringe-Needle U-100) .... Qid 10)  Triamcinolone Acetonide 0.025 % Crea (Triamcinolone Acetonide) .... Apply Three Times A Day On Affected Area As Needed 11)  Furosemide 80 Mg Tabs (Furosemide) .... Take 1 Q Am and 1/2 Pm 12)  Nebulizer 13)  Symbicort 160-4.5 Mcg/act  Aero (Budesonide-Formoterol  Fumarate) .... Two Puffs Twice Daily As Needed 14)  Spiriva Handihaler 18 Mcg Caps (Tiotropium Bromide Monohydrate) .... Inhale Contents of 1 Capsule Once A Day As Needed 15)  Spironolactone 25 Mg Tabs (Spironolactone) .... Take 1 By Mouth Qd 16)  Adult Aspirin Low Strength 81 Mg Tbdp (Aspirin) .... Take 1 By Mouth Qd 17)  Novolog 100 Unit/ml Soln (Insulin Aspart) .... 4 Times A Day (Qac) 50-35-65-10units 18)  Novolin N 100 Unit/ml Susp (Insulin Isophane Human) .... 55 Units Qhs 19)  Nitrolingual 0.4 Mg/spray Soln (Nitroglycerin) .... As Directed As Needed 20)  Ferrous Sulfate 325 (65 Fe) Mg Tbec (Ferrous Sulfate) .... Take 1 Three Times A Day 21)  Astepro 0.15 % Soln (Azelastine Hcl) .... 2 Sprays Each Nostril Each Am As Needed 22)  Flomax 0.4 Mg Xr24h-Cap (Tamsulosin Hcl) .Marland Kitchen.. 1 Tab At Bedtime 23)  02 .... 2 Liters Qhs 24)  Robitussin Dm 100-10 Mg/76ml Syrp (Dextromethorphan-Guaifenesin) .... As Directed 25)  Zaroxolyn 2.5 Mg Tabs (Metolazone) .Marland Kitchen.. 1 Tab As Directed 26)  Allopurinol 300 Mg Tabs (Allopurinol) .Marland Kitchen.. 1 Once Daily 27)  Taztia Xt 180 Mg Xr24h-Cap (Diltiazem Hcl Er Beads) .Marland Kitchen.. 1 Tab Once Daily 28)  Calcitriol 0.25 Mcg Caps (Calcitriol) .Marland Kitchen.. 1 Tab Once Daily 29)  Crestor 10 Mg Tabs (Rosuvastatin Calcium) .Marland Kitchen.. 1 Tab Once Daily  Allergies (verified): 1)  ! 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 Hyperlipidemia  DM Neuropathy impotence Idiopathic Peripheral vascular disease Renal insufficiency Osteoarthritis CVA Cough due to Zestril  Current Problems:  LONG TERM USE OF ASPIRIN (ICD-V58.66) LONG TERM ANTIPLATELET THERAPY (ICD-V58.63) HYPERTENSION, BENIGN (ICD-401.1) DIASTOLIC HEART FAILURE, CHRONIC (ICD-428.32) CAD, NATIVE VESSEL (ICD-414.01) DYSPNEA (ICD-786.05) EMPHYSEMA (ICD-492.8) EDEMA (ICD-782.3) COUGH (ICD-786.2) SPECIAL SCREENING MALIGNANT  NEOPLASM OF PROSTATE (ICD-V76.44) CONJUNCTIVITIS, RIGHT (ICD-372.30) HYPOKALEMIA (ICD-276.8) CHF (ICD-428.0) OSTEOPOROSIS (ICD-733.00) HYPERTENSION (ICD-401.9) 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    Physical Exam  General:  morbidly obese.  no distress  Abdomen:  abdomen is soft.  there is slight left lower quadrant tenderness.  no hepatosplenomegaly.   not distended.  no hernia  Genitalia:  Normal external male genitalia with no urethral discharge. no inguinal hernia. Additional Exam:  BUN                  [H]  37 mg/dL                    7-82   Creatinine           [H]  1.9 mg/dL     Impression & Recommendations:  Problem # 1:  ABDOMINAL PAIN (ICD-789.00) Assessment New uncertain etiology  Problem # 2:  constipation may contribute to #1  Problem # 3:  RENAL INSUFFICIENCY (ICD-588.9) Assessment: Unchanged  Medications Added to Medication List This Visit: 1)  Cefuroxime Axetil 500 Mg Tabs (Cefuroxime axetil) .Marland Kitchen.. 1 tab two times a day  Other Orders: Radiology Referral (Radiology) TLB-CBC Platelet - w/Differential (85025-CBCD) TLB-Udip w/ Micro (81001-URINE) TLB-BMP (Basic Metabolic Panel-BMET) (80048-METABOL) Est. Patient Level IV (95621)  Patient Instructions: 1)  take miralax 17 grams three times a day, until bowels are soft. 2)  blood and urine tests are being ordered for you today.  please call [phone] to hear your test results. 3)  take  cefuroxime 500 mg two times a day 4)  check ultrasound.  you will be called with a day and time for an appointment. Prescriptions: CEFUROXIME AXETIL 500 MG TABS (CEFUROXIME AXETIL) 1 tab two times a day  #14 x 0   Entered and Authorized by:   Minus Breeding MD   Signed by:   Minus Breeding MD on 01/18/2011   Method used:   Electronically to        [facility] [address] [id number]* (retail)       [address] [place], [place]  [address]       Ph: [phone]       Fax: [phone]   RxID:   [id number]    Orders Added: 1)  Radiology Referral [Radiology] 2)  TLB-CBC Platelet - w/Differential [85025-CBCD] 3)  TLB-Udip w/ Micro [81001-URINE] 4)  TLB-BMP (Basic Metabolic Panel-BMET) [80048-METABOL] 5)  Est. Patient Level IV [95188]

## 2011-01-31 NOTE — Progress Notes (Signed)
Summary: Nuclear Pre-Procedure  Phone Note Outgoing Call Call back at [facility] Phone [phone]   Call placed by: Stanton Kidney, EMT-P,  January 25, 2011 1:08 PM Call placed to: Patient Action Taken: Phone Call Completed Summary of Call: Reviewed information on [employer] Information Sheet (see scanned document for further details).  Spoke with the patient. Stanton Kidney, EMT-P  January 25, 2011 1:08 PM     Nuclear Med Background Indications for Stress Test: Evaluation for Ischemia, PTCA Patency   History: COPD, Emphysema, Heart Catheterization  History Comments: 5/08 Heart Cath: N/O CAD  Symptoms: Chest Pain, DOE    Nuclear Pre-Procedure Cardiac Risk Factors: CVA, Hypertension, IDDM Type 2, Lipids, Obesity, PVD Height (in): 63  Nuclear Med Study Referring MD:  Charlies Constable MD

## 2011-02-08 NOTE — Assessment & Plan Note (Signed)
Summary: 1 MTH FU STC   Vital Signs:  Patient profile:   70 year old male Height:      63 inches (160.02 cm) Weight:      242.38 pounds (110.17 kg) BMI:     43.09 O2 Sat:      94 % on Room air Temp:     98.3 degrees F (36.83 degrees C) oral Pulse rate:   77 / minute Pulse rhythm:   regular BP sitting:   120 / 62  (left arm) Cuff size:   large  Vitals Entered By: Brenton Grills CMA Duncan Dull) (January 26, 2011 11:19 AM)  O2 Flow:  Room air CC: 1 month F/U/aj Is Patient Diabetic? Yes   Referring Provider:  Romero Belling Primary Provider:  Minus Breeding MD  CC:  1 month F/U/aj.  History of Present Illness: pt says he is feeling better since his recent gi illness.  also see bariatric surg form  Current Medications (verified): 1)  Omeprazole 20 Mg  Tbec (Omeprazole) .... Take 1 By Mouth Qd 2)  Bisoprolol Fumarate 10 Mg Tabs (Bisoprolol Fumarate) .... One Tab By Mouth Once Daily 3)  Folbic 2.5-25-2 Mg  Tabs (Fa-Pyridoxine-Cyancobalamin) .... Take 1 By Mouth Qd 4)  Isosorbide Mononitrate Cr 60 Mg Xr24h-Tab (Isosorbide Mononitrate) .... One Tab By Mouth Once Daily 5)  Hydrocodone-Acetaminophen 5-500 Mg  Tabs (Hydrocodone-Acetaminophen) .... Take 1-2 Q 4 Hours Prn 6)  Trazodone Hcl 100 Mg  Tabs (Trazodone Hcl) .... Qhs 7)  Accu-Chek Compact Test Drum  Strp (Glucose Blood) .... Qid, Variable Glucoses, and Lancets 250.03 8)  Klor-Con M20 20 Meq  Tbcr (Potassium Chloride Crys Cr) .... Take 2 By Mouth Two Times A Day 9)  Bd Insulin Syringe 29g X 1/2" 1 Ml  Misc (Insulin Syringe-Needle U-100) .... Qid 10)  Triamcinolone Acetonide 0.025 % Crea (Triamcinolone Acetonide) .... Apply Three Times A Day On Affected Area As Needed 11)  Furosemide 80 Mg Tabs (Furosemide) .... Take 1 Q Am and 1/2 Pm 12)  Nebulizer 13)  Symbicort 160-4.5 Mcg/act  Aero (Budesonide-Formoterol Fumarate) .... Two Puffs Twice Daily As Needed 14)  Spiriva Handihaler 18 Mcg Caps (Tiotropium Bromide Monohydrate) .... Inhale  Contents of 1 Capsule Once A Day As Needed 15)  Spironolactone 25 Mg Tabs (Spironolactone) .... Take 1 By Mouth Qd 16)  Adult Aspirin Low Strength 81 Mg Tbdp (Aspirin) .... Take 1 By Mouth Qd 17)  Novolog 100 Unit/ml Soln (Insulin Aspart) .... 4 Times A Day (Qac) 50-35-65-10units 18)  Novolin N 100 Unit/ml Susp (Insulin Isophane Human) .... 55 Units Qhs 19)  Nitrolingual 0.4 Mg/spray Soln (Nitroglycerin) .... As Directed As Needed 20)  Ferrous Sulfate 325 (65 Fe) Mg Tbec (Ferrous Sulfate) .... Take 1 Three Times A Day 21)  Astepro 0.15 % Soln (Azelastine Hcl) .... 2 Sprays Each Nostril Each Am As Needed 22)  Flomax 0.4 Mg Xr24h-Cap (Tamsulosin Hcl) .Marland Kitchen.. 1 Tab At Bedtime 23)  02 .... 2 Liters Qhs 24)  Robitussin Dm 100-10 Mg/3ml Syrp (Dextromethorphan-Guaifenesin) .... As Directed 25)  Zaroxolyn 2.5 Mg Tabs (Metolazone) .Marland Kitchen.. 1 Tab As Directed 26)  Allopurinol 300 Mg Tabs (Allopurinol) .Marland Kitchen.. 1 Once Daily 27)  Taztia Xt 180 Mg Xr24h-Cap (Diltiazem Hcl Er Beads) .Marland Kitchen.. 1 Tab Once Daily 28)  Calcitriol 0.25 Mcg Caps (Calcitriol) .Marland Kitchen.. 1 Tab Once Daily 29)  Crestor 10 Mg Tabs (Rosuvastatin Calcium) .Marland Kitchen.. 1 Tab Once Daily  Allergies (verified): 1)  ! 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 Hyperlipidemia  DM Neuropathy impotence Idiopathic Peripheral vascular disease Renal insufficiency Osteoarthritis CVA Cough due to Zestril  Current Problems:  LONG TERM USE OF ASPIRIN (ICD-V58.66) LONG TERM ANTIPLATELET THERAPY (ICD-V58.63) HYPERTENSION, BENIGN (ICD-401.1) DIASTOLIC HEART FAILURE, CHRONIC (ICD-428.32) CAD, NATIVE VESSEL (ICD-414.01) DYSPNEA (ICD-786.05) EMPHYSEMA (ICD-492.8) EDEMA (ICD-782.3) COUGH (ICD-786.2) SPECIAL SCREENING MALIGNANT NEOPLASM OF PROSTATE (ICD-V76.44) CONJUNCTIVITIS, RIGHT (ICD-372.30) HYPOKALEMIA (ICD-276.8) CHF (ICD-428.0) OSTEOPOROSIS  (ICD-733.00) HYPERTENSION (ICD-401.9) 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         he says he did not get diarrhea after miralax.    Physical Exam  General:  morbidly obese.  no distress  Pulses:  dorsalis pedis intact bilat Extremities:  no deformity.  no ulcer on the feet.  feet are of normal color and temp.   trace right pedal edema and trace left pedal edema.   Neurologic:  cn 2-12 grossly intact.   readily moves all 4's.   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 Patient Level III (69629)  Patient Instructions: 1)  same medications. 2)  we'll send forms to [facility].   3)  Please schedule a regular physical appointment in 3 months.   Orders Added: 1)  Est. Patient Level III [52841]

## 2011-02-08 NOTE — Letter (Signed)
Summary: Weight Loss Documentation  Weight Loss Documentation   Imported By: Lennie Odor 01/31/2011 11:56:53  _____________________________________________________________________  External Attachment:    Type:   Image     Comment:   External Document

## 2011-02-08 NOTE — Assessment & Plan Note (Signed)
Summary: Cardiology Nuclear Testing  Nuclear Med Background Indications for Stress Test: Evaluation for Ischemia, PTCA Patency   History: COPD, Emphysema, Heart Catheterization  History Comments: 5/08 Heart Cath: N/O CAD  Symptoms: Chest Pain, DOE, Palpitations, SOB    Nuclear Pre-Procedure Cardiac Risk Factors: CVA, Hypertension, IDDM Type 2, Lipids, Obesity, PVD Caffeine/Decaff Intake: none NPO After: 10:30 PM Lungs: clear IV 0.9% NS with Angio Cath: 22g     IV Site: R Hand IV Started by: Cathlyn Parsons, RN Chest Size (in) 44     Height (in): 63 Weight (lb): 240 BMI: 42.67 Tech Comments: Bisoprolol held x 24hrs. BS this am 200,no insulin taken.  Nuclear Med Study 1 or 2 day study:  1 day     Stress Test Type:  Eugenie Birks Reading MD:  Willa Rough, MD     Referring MD:  C.McAlhany Resting Radionuclide:  Technetium 87m Tetrofosmin     Resting Radionuclide Dose:  10 mCi  Stress Radionuclide:  Technetium 52m Tetrofosmin     Stress Radionuclide Dose:  33 mCi   Stress Protocol  Max Systolic BP: 125 mm Hg Lexiscan: 0.4 mg   Stress Test Technologist:  Milana Na, EMT-P     Nuclear Technologist:  Doyne Keel, CNMT  Rest Procedure  Myocardial perfusion imaging was performed at rest 45 minutes following the intravenous administration of Technetium 43m Tetrofosmin.  Stress Procedure  The patient received IV adenosine at 140 mcg/kg/min for 4 minutes. There were no significant changes and a rare pvc with infusion. Technetium 71m Tetrofosmin was injected at the 2 minute mark and quantitative spect images were obtained after a 45 minute delay.  QPS Raw Data Images:  Patient motion noted; appropriate software correction applied. Stress Images:  Mild decrease in activity in the inferior wall. Rest Images:  Same as stress Subtraction (SDS):  No evidence of ischemia. Transient Ischemic Dilatation:  .93  (Normal <1.22)  Lung/Heart Ratio:  .35  (Normal <0.45)  Quantitative  Gated Spect Images QGS EDV:  86 ml QGS ESV:  33 ml QGS EF:  61 % QGS cine images:  Normal motion.   Overall Impression  Exercise Capacity: Lexiscan with no exercise. BP Response: Normal blood pressure response. Clinical Symptoms: SOB ECG Impression: No significant ST segment change suggestive of ischemia. Overall Impression Comments: The images are unchanged since 2007. There is old decrease in inferior activity. This may be attenuation, as motion is normal. There is no ischemia.  Appended Document: Cardiology Nuclear Testing Former pt of Charlies Constable. Stress test is ok. This was done for pre-operative risk assessment. It looks like the pt has lap band gastric surgery planned for March. Can we let the patient know and see who the surgeon is so we can fax the results over? thanks, cdm  Appended Document: Cardiology Nuclear Testing patient aware. Will send clearance letter to Dr. Ezzard Standing with [facility].

## 2011-02-14 ENCOUNTER — Encounter (HOSPITAL_COMMUNITY): Payer: Self-pay

## 2011-02-23 ENCOUNTER — Ambulatory Visit (INDEPENDENT_AMBULATORY_CARE_PROVIDER_SITE_OTHER): Payer: Medicare Other | Admitting: Endocrinology

## 2011-02-23 ENCOUNTER — Encounter: Payer: Self-pay | Admitting: Endocrinology

## 2011-02-23 DIAGNOSIS — E109 Type 1 diabetes mellitus without complications: Secondary | ICD-10-CM

## 2011-03-01 NOTE — Assessment & Plan Note (Signed)
Summary: f/u appt   Vital Signs:  Patient profile:   70 year old male Height:      63 inches (160.02 cm) Weight:      242.50 pounds (110.23 kg) BMI:     43.11 O2 Sat:      93 % on Room air Temp:     98.4 degrees F (36.89 degrees C) oral Pulse rate:   74 / minute Pulse rhythm:   regular BP sitting:   124 / 62  (left arm) Cuff size:   large  Vitals Entered By: Brenton Grills CMA Duncan Dull) (February 23, 2011 11:01 AM)  O2 Flow:  Room air CC: Follow-up visit/aj Is Patient Diabetic? Yes   Referring Provider:  Romero Belling Primary Provider:  Minus Breeding MD  CC:  Follow-up visit/aj.  History of Present Illness: pt says he is working hard on his diet, but he is discouraged about the fact that he isn't losing weight.  he says exercise is much better recently, but is limited by doe.    Current Medications (verified): 1)  Omeprazole 20 Mg  Tbec (Omeprazole) .... Take 1 By Mouth Qd 2)  Bisoprolol Fumarate 10 Mg Tabs (Bisoprolol Fumarate) .... One Tab By Mouth Once Daily 3)  Folbic 2.5-25-2 Mg  Tabs (Fa-Pyridoxine-Cyancobalamin) .... Take 1 By Mouth Qd 4)  Isosorbide Mononitrate Cr 60 Mg Xr24h-Tab (Isosorbide Mononitrate) .... One Tab By Mouth Once Daily 5)  Hydrocodone-Acetaminophen 5-500 Mg  Tabs (Hydrocodone-Acetaminophen) .... Take 1-2 Q 4 Hours Prn 6)  Trazodone Hcl 100 Mg  Tabs (Trazodone Hcl) .... Qhs 7)  Accu-Chek Compact Test Drum  Strp (Glucose Blood) .... Qid, Variable Glucoses, and Lancets 250.03 8)  Klor-Con M20 20 Meq  Tbcr (Potassium Chloride Crys Cr) .... Take 2 By Mouth Two Times A Day 9)  Bd Insulin Syringe 29g X 1/2" 1 Ml  Misc (Insulin Syringe-Needle U-100) .... Qid 10)  Triamcinolone Acetonide 0.025 % Crea (Triamcinolone Acetonide) .... Apply Three Times A Day On Affected Area As Needed 11)  Furosemide 80 Mg Tabs (Furosemide) .... Take 1 Q Am and 1/2 Pm 12)  Nebulizer 13)  Symbicort 160-4.5 Mcg/act  Aero (Budesonide-Formoterol Fumarate) .... Two Puffs Twice Daily As  Needed 14)  Spiriva Handihaler 18 Mcg Caps (Tiotropium Bromide Monohydrate) .... Inhale Contents of 1 Capsule Once A Day As Needed 15)  Spironolactone 25 Mg Tabs (Spironolactone) .... Take 1 By Mouth Qd 16)  Adult Aspirin Low Strength 81 Mg Tbdp (Aspirin) .... Take 1 By Mouth Qd 17)  Novolog 100 Unit/ml Soln (Insulin Aspart) .... 4 Times A Day (Qac) 50-35-65-10units 18)  Novolin N 100 Unit/ml Susp (Insulin Isophane Human) .... 55 Units Qhs 19)  Nitrolingual 0.4 Mg/spray Soln (Nitroglycerin) .... As Directed As Needed 20)  Ferrous Sulfate 325 (65 Fe) Mg Tbec (Ferrous Sulfate) .... Take 1 Three Times A Day 21)  Astepro 0.15 % Soln (Azelastine Hcl) .... 2 Sprays Each Nostril Each Am As Needed 22)  Flomax 0.4 Mg Xr24h-Cap (Tamsulosin Hcl) .Marland Kitchen.. 1 Tab At Bedtime 23)  02 .... 2 Liters Qhs 24)  Robitussin Dm 100-10 Mg/49ml Syrp (Dextromethorphan-Guaifenesin) .... As Directed 25)  Zaroxolyn 2.5 Mg Tabs (Metolazone) .Marland Kitchen.. 1 Tab As Directed 26)  Allopurinol 300 Mg Tabs (Allopurinol) .Marland Kitchen.. 1 Once Daily 27)  Taztia Xt 180 Mg Xr24h-Cap (Diltiazem Hcl Er Beads) .Marland Kitchen.. 1 Tab Once Daily 28)  Calcitriol 0.25 Mcg Caps (Calcitriol) .Marland Kitchen.. 1 Tab Once Daily 29)  Crestor 10 Mg Tabs (Rosuvastatin Calcium) .Marland KitchenMarland KitchenMarland Kitchen 1  Tab Once Daily  Allergies (verified): 1)  ! 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 Hyperlipidemia  DM Neuropathy impotence Idiopathic Peripheral vascular disease Renal insufficiency Osteoarthritis CVA Cough due to Zestril  Current Problems:  LONG TERM USE OF ASPIRIN (ICD-V58.66) LONG TERM ANTIPLATELET THERAPY (ICD-V58.63) HYPERTENSION, BENIGN (ICD-401.1) DIASTOLIC HEART FAILURE, CHRONIC (ICD-428.32) CAD, NATIVE VESSEL (ICD-414.01) DYSPNEA (ICD-786.05) EMPHYSEMA (ICD-492.8) EDEMA (ICD-782.3) COUGH (ICD-786.2) SPECIAL SCREENING MALIGNANT NEOPLASM OF PROSTATE  (ICD-V76.44) CONJUNCTIVITIS, RIGHT (ICD-372.30) HYPOKALEMIA (ICD-276.8) CHF (ICD-428.0) OSTEOPOROSIS (ICD-733.00) HYPERTENSION (ICD-401.9) 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    Physical Exam  General:  morbidly obese.  no distress  Lungs:  Clear to auscultation bilaterally. Normal respiratory effort.  Extremities:  trace right pedal edema and trace left pedal edema.     Impression & Recommendations:  Problem # 1:  DIABETES MELLITUS, TYPE I (ICD-250.01) therapy is limited by his difficulty losing weight  Other Orders: Est. Patient Level III (16109)  Patient Instructions: 1)  same medications. 2)  try to work on diet, especially portion control. 3)  exercise.  you can manage shortness of breath by stopping to rest periodically.   4)  Please schedule a regular physical appointment in 1 month. Prescriptions: HYDROCODONE-ACETAMINOPHEN 5-500 MG  TABS (HYDROCODONE-ACETAMINOPHEN) take 1-2 q 4 hours prn  #100 x 5   Entered and Authorized by:   Minus Breeding MD   Signed by:   Minus Breeding MD on 02/23/2011   Method used:   Print then Give to Patient   RxID:   [id number]    Orders Added: 1)  Est. Patient Level III [95621]

## 2011-03-06 NOTE — Letter (Signed)
Summary: Weight Loss Form  Weight Loss Form   Imported By: Sherian Rein 02/27/2011 08:24:35  _____________________________________________________________________  External Attachment:    Type:   Image     Comment:   External Document

## 2011-03-07 ENCOUNTER — Encounter: Payer: Self-pay | Admitting: Cardiovascular Disease

## 2011-03-19 ENCOUNTER — Other Ambulatory Visit: Payer: Self-pay | Admitting: Endocrinology

## 2011-03-21 ENCOUNTER — Encounter: Payer: Self-pay | Admitting: Cardiovascular Disease

## 2011-03-21 ENCOUNTER — Ambulatory Visit (INDEPENDENT_AMBULATORY_CARE_PROVIDER_SITE_OTHER): Payer: Medicare Other | Admitting: Cardiovascular Disease

## 2011-03-21 DIAGNOSIS — I5032 Chronic diastolic (congestive) heart failure: Secondary | ICD-10-CM

## 2011-03-21 DIAGNOSIS — I2510 Atherosclerotic heart disease of native coronary artery without angina pectoris: Secondary | ICD-10-CM

## 2011-03-21 DIAGNOSIS — Z01810 Encounter for preprocedural cardiovascular examination: Secondary | ICD-10-CM | POA: Insufficient documentation

## 2011-03-21 DIAGNOSIS — I509 Heart failure, unspecified: Secondary | ICD-10-CM

## 2011-03-21 NOTE — Assessment & Plan Note (Signed)
Stable. No chest pain. He is tolerating all of his medications. No changes.

## 2011-03-21 NOTE — Progress Notes (Signed)
History of Present Illness:70 yo WM with h/o CAD, diastolic CHF, HTN,CRI, hyperlipidemia, PAD, DM here today for cardiac follow up. He has been followed in the past by Dr. [name]. He has had multiple prior PCI procedures. He has chronic diastolic heart failure. Stress myoview in February 2012 with no ischemia. This was done as part of the pre-operative workup before a planned lap band surgery. He is planning to have this surgery soon.  He tells me that he has been doing well. No exertional chest pain or change in baseline SOB. No dizziness, near syncope or syncope. No palpitations. He has been active without limitation.    Past Medical History  Diagnosis Date  . Allergic rhinitis   . COPD (chronic obstructive pulmonary disease)     mild to moderate by pfts in 2006  . HLD (hyperlipidemia)   . Impotence   . PVD (peripheral vascular disease)   . Type II or unspecified type diabetes mellitus with renal manifestations, not stated as uncontrolled   . Renal insufficiency   . Osteoarthritis   . CVA (cerebral infarction)   . Cough     due to Zestril  . Encounter for long-term (current) use of aspirin   . Encounter for long-term (current) use of antiplatelets/antithrombotics   . Essential hypertension, benign   . Chronic diastolic heart failure   . Coronary atherosclerosis of native coronary artery   . Shortness of breath   . Other emphysema   . Edema   . Special screening for malignant neoplasm of prostate   . Conjunctivitis unspecified   . Hypopotassemia   . Osteoporosis, unspecified   . Secondary hyperparathyroidism (of renal origin)   . Gout, unspecified   . Pneumonia, organism unspecified   . 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 Hemiplegia affecting unspecified side, late effect of cerebrovascular disease   . Headache     Past Surgical History  Procedure Date  . Nasal sinus surgery   . Esophagogastroduodenoscopy    . Cardiac catheterization 01/18/2005    Current Outpatient Prescriptions  Medication Sig Dispense Refill  . allopurinol (ZYLOPRIM) 300 MG tablet Take 300 mg by mouth daily.        Marland Kitchen aspirin 81 MG tablet Take 81 mg by mouth daily.        . Azelastine HCl (ASTEPRO) 0.15 % SOLN 2 sprays by Nasal route 2 (two) times daily as needed.        . bisoprolol (ZEBETA) 10 MG tablet Take 10 mg by mouth daily.        . budesonide-formoterol (SYMBICORT) 160-4.5 MCG/ACT inhaler Inhale 2 puffs into the lungs 2 (two) times daily as needed.        . calcitRIOL (ROCALTROL) 0.25 MCG capsule Take 0.25 mcg by mouth daily.        Marland Kitchen diltiazem (TIAZAC) 180 MG 24 hr capsule Take 180 mg by mouth daily.        . ferrous sulfate 325 (65 FE) MG tablet Take 325 mg by mouth 3 (three) times daily.        . folic acid-pyridoxine-cyancobalamin (FOLBIC) 2.5-25-2 MG TABS Take 1 tablet by mouth daily.        . furosemide (LASIX) 80 MG tablet TAKE 1 TABLET BY MOUTH EVERY MORNING AND  1/2 TABLET EVERY EVENING  135 tablet  2  . glucose blood test strip Use as instructed       . guaiFENesin-dextromethorphan (ROBITUSSIN  DM) 100-10 MG/5ML syrup Take 5 mLs by mouth as directed.        Marland Kitchen HYDROcodone-acetaminophen (VICODIN) 5-500 MG per tablet Take 1-2 tablets by mouth every 4 (four) hours as needed.        . insulin aspart (NOVOLOG) 100 UNIT/ML injection Inject into the skin 4 (four) times daily.        . insulin NPH (NOVOLIN N) 100 UNIT/ML injection Inject 55 Units into the skin at bedtime.        . INSULIN SYRINGE 1CC/29G (B-D INSULIN SYRINGE) 29G X 1/2" 1 ML MISC as directed.        . isosorbide mononitrate (IMDUR) 60 MG 24 hr tablet Take 60 mg by mouth daily.        . metolazone (ZAROXOLYN) 2.5 MG tablet Take 2.5 mg by mouth. 1 tablet by mouth mon wed and friday      . nitroGLYCERIN (NITROLINGUAL) 0.4 MG/SPRAY spray Place 1 spray under the tongue as directed.        Marland Kitchen omeprazole (PRILOSEC) 20 MG capsule Take 20 mg by mouth daily.         . polyethylene glycol powder (MIRALAX) powder Take 17 g by mouth as needed.        . potassium chloride SA (K-DUR,KLOR-CON) 20 MEQ tablet Take 40 mEq by mouth 2 (two) times daily.        . rosuvastatin (CRESTOR) 10 MG tablet Take 10 mg by mouth daily.        Marland Kitchen spironolactone (ALDACTONE) 25 MG tablet Take 25 mg by mouth daily.        . Tamsulosin HCl (FLOMAX) 0.4 MG CAPS Take 0.4 mg by mouth at bedtime.        Marland Kitchen tiotropium (SPIRIVA) 18 MCG inhalation capsule Place 18 mcg into inhaler and inhale daily as needed.        . traZODone (DESYREL) 100 MG tablet Take 100 mg by mouth at bedtime.        . triamcinolone (KENALOG) 0.025 % cream Apply 1 application topically 3 (three) times daily as needed.          Allergies  Allergen Reactions  . Enalapril Maleate     REACTION: cough  . Shellfish-Derived Products Swelling    History   Social History  . Marital Status: Married    Spouse Name: N/A    Number of Children: N/A  . Years of Education: N/A   Occupational History  .  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 Smoking status: Former Smoker -- 2.0 packs/day    Types: Cigarettes    Quit date: 12/24/1997  . Smokeless tobacco: Not on file  . Alcohol Use: Not on file  . Drug Use: Not on file  . Sexually Active: Not on file   Other Topics Concern  . Not on file   Social History Narrative  . No narrative on file    Family History  Problem Relation Age of Onset  . Hypertension      family history of htn  . Heart disease Maternal Aunt   . Lung cancer Mother     Review of Systems:  No chest pain, SOB, palpitations, dizziness,  near syncope or syncope.  No PND, orthopnea, or Lower extremity edema.   BP 112/66  Pulse 75  Resp 18  Ht 5\' 3"  (1.6 m)  Wt 243 lb 12.8 oz (110.587 kg)  BMI 43.19 kg/m2  Physical Examination:  General: Well developed, well nourished, NAD HEENT: OP clear, mucus membranes moist SKIN: warm, dry. No rashes. Neuro: No focal  deficits Musculoskeletal: Muscle strength 5/5 all ext Psychiatric: Mood and affect normal Neck: No JVD, no carotid bruits, no thyromegaly, no lymphadenopathy. Lungs:Clear bilaterally, no wheezes, rhonci, crackles Cardiovascular: Regular rate and rhythm. No murmurs, gallops or rubs. Abdomen:Soft. Bowel sounds present. Non-tender.  Extremities: No lower extremity edema. Pulses are 2 + in the bilateral DP/PT.  Stress Myoview: Stress Procedure   The patient received IV adenosine at 140 mcg/kg/min for 4 minutes. There were no significant changes and a rare pvc with infusion. Technetium 52m Tetrofosmin was injected at the 2 minute mark and quantitative spect images were obtained after a 45 minute delay.  QPS  Raw Data Images:  Patient motion noted; appropriate software correction applied. Stress Images:  Mild decrease in activity in the inferior wall. Rest Images:  Same as stress Subtraction (SDS):  No evidence of ischemia. Transient Ischemic Dilatation:  .93  (Normal <1.22)  Lung/Heart Ratio:  .35  (Normal <0.45)  Quantitative Gated Spect Images  QGS EDV:  86 ml QGS ESV:  33 ml QGS EF:  61 % QGS cine images:  Normal motion.   Overall Impression   Exercise Capacity: Lexiscan with no exercise. BP Response: Normal blood pressure response. Clinical Symptoms: SOB ECG Impression: No significant ST segment change suggestive of ischemia. Overall Impression Comments: The images are unchanged since 2007. There is old decrease in inferior activity. This may be attenuation, as motion is normal. There is no ischemia.

## 2011-03-21 NOTE — Assessment & Plan Note (Signed)
Volume status ok. No changes in therapy.

## 2011-03-21 NOTE — Assessment & Plan Note (Signed)
No further cardiac workup needed before planned surgical procedure.

## 2011-03-27 ENCOUNTER — Ambulatory Visit (INDEPENDENT_AMBULATORY_CARE_PROVIDER_SITE_OTHER): Payer: Medicare Other | Admitting: Endocrinology

## 2011-03-27 ENCOUNTER — Encounter: Payer: Self-pay | Admitting: Endocrinology

## 2011-03-27 NOTE — Patient Instructions (Signed)
Please schedule a regular physical, prior to your surgery is done.

## 2011-03-28 NOTE — Progress Notes (Signed)
  Subjective:    Patient ID: [name], male    DOB: 1941-03-21, 70 y.o.   MRN: [id number]  HPI Pt says he continues to struggle with his diet, and says he is anxious to go ahead with bariatric surgery.  pt states he feels well in general, except for slight doe.   Review of Systems Denies chest pain    Objective:   Physical Exam GENERAL: no distress.  Obese LUNGS:  Clear to auscultation HEART: Regular rate and rhythm without murmurs noted. Normal S1,S2.         Assessment & Plan:  Morbid obesity, no improvement

## 2011-04-02 ENCOUNTER — Other Ambulatory Visit: Payer: Self-pay | Admitting: Endocrinology

## 2011-04-09 LAB — BASIC METABOLIC PANEL
BUN: 35 mg/dL — ABNORMAL HIGH (ref 6–23)
BUN: 35 mg/dL — ABNORMAL HIGH (ref 6–23)
BUN: 36 mg/dL — ABNORMAL HIGH (ref 6–23)
BUN: 43 mg/dL — ABNORMAL HIGH (ref 6–23)
CO2: 36 mEq/L — ABNORMAL HIGH (ref 19–32)
CO2: 36 mEq/L — ABNORMAL HIGH (ref 19–32)
Calcium: 9.1 mg/dL (ref 8.4–10.5)
Calcium: 9.6 mg/dL (ref 8.4–10.5)
Calcium: 9.8 mg/dL (ref 8.4–10.5)
Calcium: 9.9 mg/dL (ref 8.4–10.5)
Chloride: 90 mEq/L — ABNORMAL LOW (ref 96–112)
Chloride: 91 mEq/L — ABNORMAL LOW (ref 96–112)
Chloride: 94 mEq/L — ABNORMAL LOW (ref 96–112)
Creatinine, Ser: 1.9 mg/dL — ABNORMAL HIGH (ref 0.4–1.5)
Creatinine, Ser: 1.94 mg/dL — ABNORMAL HIGH (ref 0.4–1.5)
Creatinine, Ser: 1.96 mg/dL — ABNORMAL HIGH (ref 0.4–1.5)
Creatinine, Ser: 2.02 mg/dL — ABNORMAL HIGH (ref 0.4–1.5)
Creatinine, Ser: 2.11 mg/dL — ABNORMAL HIGH (ref 0.4–1.5)
GFR calc Af Amer: 40 mL/min — ABNORMAL LOW (ref >60)
GFR calc Af Amer: 42 mL/min — ABNORMAL LOW (ref >60)
GFR calc non Af Amer: 33 mL/min — ABNORMAL LOW (ref >60)
GFR calc non Af Amer: 36 mL/min — ABNORMAL LOW (ref >60)
Glucose, Bld: 148 mg/dL — ABNORMAL HIGH (ref 70–99)
Glucose, Bld: 222 mg/dL — ABNORMAL HIGH (ref 70–99)
Potassium: 3.5 mEq/L (ref 3.5–5.1)
Sodium: 136 mEq/L (ref 135–145)
Sodium: 136 mEq/L (ref 135–145)

## 2011-04-09 LAB — CARDIAC PANEL(CRET KIN+CKTOT+MB+TROPI)
Relative Index: INVALID (ref 0.0–2.5)
Total CK: 71 U/L (ref 7–232)
Troponin I: <0.01 ng/mL (ref 0.00–0.06)

## 2011-04-09 LAB — GLUCOSE, CAPILLARY
Glucose-Capillary: 108 mg/dL — ABNORMAL HIGH (ref 70–99)
Glucose-Capillary: 122 mg/dL — ABNORMAL HIGH (ref 70–99)
Glucose-Capillary: 138 mg/dL — ABNORMAL HIGH (ref 70–99)
Glucose-Capillary: 152 mg/dL — ABNORMAL HIGH (ref 70–99)
Glucose-Capillary: 162 mg/dL — ABNORMAL HIGH (ref 70–99)
Glucose-Capillary: 163 mg/dL — ABNORMAL HIGH (ref 70–99)
Glucose-Capillary: 168 mg/dL — ABNORMAL HIGH (ref 70–99)
Glucose-Capillary: 185 mg/dL — ABNORMAL HIGH (ref 70–99)
Glucose-Capillary: 207 mg/dL — ABNORMAL HIGH (ref 70–99)
Glucose-Capillary: 227 mg/dL — ABNORMAL HIGH (ref 70–99)
Glucose-Capillary: 252 mg/dL — ABNORMAL HIGH (ref 70–99)
Glucose-Capillary: 254 mg/dL — ABNORMAL HIGH (ref 70–99)
Glucose-Capillary: 267 mg/dL — ABNORMAL HIGH (ref 70–99)
Glucose-Capillary: 276 mg/dL — ABNORMAL HIGH (ref 70–99)
Glucose-Capillary: 277 mg/dL — ABNORMAL HIGH (ref 70–99)
Glucose-Capillary: 81 mg/dL (ref 70–99)
Glucose-Capillary: 92 mg/dL (ref 70–99)

## 2011-04-09 LAB — COMPREHENSIVE METABOLIC PANEL
AST: 19 U/L (ref 0–37)
CO2: 30 mEq/L (ref 19–32)
Calcium: 9.1 mg/dL (ref 8.4–10.5)
Creatinine, Ser: 1.93 mg/dL — ABNORMAL HIGH (ref 0.4–1.5)
GFR calc Af Amer: 42 mL/min — ABNORMAL LOW (ref >60)
GFR calc non Af Amer: 35 mL/min — ABNORMAL LOW (ref >60)
Sodium: 138 mEq/L (ref 135–145)
Total Protein: 6.2 g/dL (ref 6.0–8.3)

## 2011-04-09 LAB — MAGNESIUM: Magnesium: 2.4 mg/dL (ref 1.5–2.5)

## 2011-04-09 LAB — CBC
MCHC: 32.6 g/dL (ref 30.0–36.0)
MCV: 92.2 fL (ref 78.0–100.0)
RBC: 4.18 MIL/uL — ABNORMAL LOW (ref 4.22–5.81)
RDW: 16.2 % — ABNORMAL HIGH (ref 11.5–15.5)

## 2011-04-09 LAB — APTT: aPTT: 46 seconds — ABNORMAL HIGH (ref 24–37)

## 2011-04-09 LAB — PROTIME-INR
INR: 2.7 — ABNORMAL HIGH (ref 0.00–1.49)
INR: 2.8 — ABNORMAL HIGH (ref 0.00–1.49)
Prothrombin Time: 30.5 seconds — ABNORMAL HIGH (ref 11.6–15.2)
Prothrombin Time: 32 seconds — ABNORMAL HIGH (ref 11.6–15.2)

## 2011-04-09 LAB — TSH: TSH: 1.328 u[IU]/mL (ref 0.350–4.500)

## 2011-04-09 LAB — BRAIN NATRIURETIC PEPTIDE: Pro B Natriuretic peptide (BNP): 111 pg/mL — ABNORMAL HIGH (ref 0.0–100.0)

## 2011-04-10 LAB — BASIC METABOLIC PANEL
BUN: 29 mg/dL — ABNORMAL HIGH (ref 6–23)
BUN: 40 mg/dL — ABNORMAL HIGH (ref 6–23)
CO2: 33 mEq/L — ABNORMAL HIGH (ref 19–32)
CO2: 35 mEq/L — ABNORMAL HIGH (ref 19–32)
CO2: 37 mEq/L — ABNORMAL HIGH (ref 19–32)
CO2: 37 mEq/L — ABNORMAL HIGH (ref 19–32)
CO2: 37 mEq/L — ABNORMAL HIGH (ref 19–32)
CO2: 38 mEq/L — ABNORMAL HIGH (ref 19–32)
CO2: 39 mEq/L — ABNORMAL HIGH (ref 19–32)
CO2: 40 mEq/L — ABNORMAL HIGH (ref 19–32)
Calcium: 9.2 mg/dL (ref 8.4–10.5)
Calcium: 8.5 mg/dL (ref 8.4–10.5)
Calcium: 8.7 mg/dL (ref 8.4–10.5)
Calcium: 8.7 mg/dL (ref 8.4–10.5)
Chloride: 85 mEq/L — ABNORMAL LOW (ref 96–112)
Chloride: 81 mEq/L — ABNORMAL LOW (ref 96–112)
Chloride: 83 mEq/L — ABNORMAL LOW (ref 96–112)
Chloride: 85 mEq/L — ABNORMAL LOW (ref 96–112)
Chloride: 85 mEq/L — ABNORMAL LOW (ref 96–112)
Chloride: 87 mEq/L — ABNORMAL LOW (ref 96–112)
Chloride: 87 mEq/L — ABNORMAL LOW (ref 96–112)
Creatinine, Ser: 1.77 mg/dL — ABNORMAL HIGH (ref 0.4–1.5)
Creatinine, Ser: 1.82 mg/dL — ABNORMAL HIGH (ref 0.4–1.5)
Creatinine, Ser: 1.84 mg/dL — ABNORMAL HIGH (ref 0.4–1.5)
Creatinine, Ser: 1.86 mg/dL — ABNORMAL HIGH (ref 0.4–1.5)
Creatinine, Ser: 1.95 mg/dL — ABNORMAL HIGH (ref 0.4–1.5)
GFR calc Af Amer: 42 mL/min — ABNORMAL LOW (ref >60)
GFR calc Af Amer: 43 mL/min — ABNORMAL LOW (ref >60)
GFR calc Af Amer: 44 mL/min — ABNORMAL LOW (ref >60)
GFR calc Af Amer: 44 mL/min — ABNORMAL LOW (ref >60)
GFR calc Af Amer: 45 mL/min — ABNORMAL LOW (ref >60)
GFR calc Af Amer: 45 mL/min — ABNORMAL LOW (ref >60)
GFR calc Af Amer: 47 mL/min — ABNORMAL LOW (ref >60)
GFR calc non Af Amer: 35 mL/min — ABNORMAL LOW (ref >60)
GFR calc non Af Amer: 37 mL/min — ABNORMAL LOW (ref >60)
GFR calc non Af Amer: 37 mL/min — ABNORMAL LOW (ref >60)
Glucose, Bld: 392 mg/dL — ABNORMAL HIGH (ref 70–99)
Glucose, Bld: 173 mg/dL — ABNORMAL HIGH (ref 70–99)
Glucose, Bld: 213 mg/dL — ABNORMAL HIGH (ref 70–99)
Glucose, Bld: 231 mg/dL — ABNORMAL HIGH (ref 70–99)
Glucose, Bld: 255 mg/dL — ABNORMAL HIGH (ref 70–99)
Potassium: 3.3 mEq/L — ABNORMAL LOW (ref 3.5–5.1)
Potassium: 2.4 mEq/L — CL (ref 3.5–5.1)
Potassium: 2.8 mEq/L — ABNORMAL LOW (ref 3.5–5.1)
Potassium: 2.9 mEq/L — ABNORMAL LOW (ref 3.5–5.1)
Sodium: 129 mEq/L — ABNORMAL LOW (ref 135–145)
Sodium: 131 mEq/L — ABNORMAL LOW (ref 135–145)
Sodium: 132 mEq/L — ABNORMAL LOW (ref 135–145)
Sodium: 132 mEq/L — ABNORMAL LOW (ref 135–145)
Sodium: 135 mEq/L (ref 135–145)
Sodium: 135 mEq/L (ref 135–145)

## 2011-04-10 LAB — POCT CARDIAC MARKERS: Myoglobin, poc: 163 ng/mL (ref 12–200)

## 2011-04-10 LAB — PREPARE FRESH FROZEN PLASMA

## 2011-04-10 LAB — CBC
HCT: 23.3 % — ABNORMAL LOW (ref 39.0–52.0)
HCT: 32.6 % — ABNORMAL LOW (ref 39.0–52.0)
HCT: 33 % — ABNORMAL LOW (ref 39.0–52.0)
Hemoglobin: 7.3 g/dL — CL (ref 13.0–17.0)
Hemoglobin: 8 g/dL — ABNORMAL LOW (ref 13.0–17.0)
Hemoglobin: 10.7 g/dL — ABNORMAL LOW (ref 13.0–17.0)
Hemoglobin: 11.6 g/dL — ABNORMAL LOW (ref 13.0–17.0)
Hemoglobin: 11.7 g/dL — ABNORMAL LOW (ref 13.0–17.0)
MCHC: 34.4 g/dL (ref 30.0–36.0)
MCHC: 35.1 g/dL (ref 30.0–36.0)
MCHC: 34.4 g/dL (ref 30.0–36.0)
MCHC: 34.7 g/dL (ref 30.0–36.0)
MCHC: 35.3 g/dL (ref 30.0–36.0)
MCHC: 35.3 g/dL (ref 30.0–36.0)
MCV: 93 fL (ref 78.0–100.0)
MCV: 94.2 fL (ref 78.0–100.0)
MCV: 91.4 fL (ref 78.0–100.0)
MCV: 91.8 fL (ref 78.0–100.0)
MCV: 92.2 fL (ref 78.0–100.0)
MCV: 92.3 fL (ref 78.0–100.0)
RBC: 2.21 MIL/uL — ABNORMAL LOW (ref 4.22–5.81)
RBC: 2.51 MIL/uL — ABNORMAL LOW (ref 4.22–5.81)
RBC: 3.37 MIL/uL — ABNORMAL LOW (ref 4.22–5.81)
RBC: 3.56 MIL/uL — ABNORMAL LOW (ref 4.22–5.81)
RBC: 3.59 MIL/uL — ABNORMAL LOW (ref 4.22–5.81)
RBC: 3.6 MIL/uL — ABNORMAL LOW (ref 4.22–5.81)
RDW: 15.5 % (ref 11.5–15.5)
RDW: 15.7 % — ABNORMAL HIGH (ref 11.5–15.5)
RDW: 16.3 % — ABNORMAL HIGH (ref 11.5–15.5)
WBC: 9.5 10*3/uL (ref 4.0–10.5)
WBC: 8.9 10*3/uL (ref 4.0–10.5)

## 2011-04-10 LAB — CROSSMATCH
ABO/RH(D): A POS
Antibody Screen: NEGATIVE

## 2011-04-10 LAB — IRON AND TIBC
Iron: 101 ug/dL (ref 42–135)
UIBC: 184 ug/dL

## 2011-04-10 LAB — COMPREHENSIVE METABOLIC PANEL
ALT: 19 U/L (ref 0–53)
CO2: 32 mEq/L (ref 19–32)
Calcium: 9.5 mg/dL (ref 8.4–10.5)
Chloride: 89 mEq/L — ABNORMAL LOW (ref 96–112)
GFR calc non Af Amer: 32 mL/min — ABNORMAL LOW (ref >60)
Glucose, Bld: 197 mg/dL — ABNORMAL HIGH (ref 70–99)
Sodium: 135 mEq/L (ref 135–145)
Total Bilirubin: 0.6 mg/dL (ref 0.3–1.2)

## 2011-04-10 LAB — GLUCOSE, CAPILLARY
Glucose-Capillary: 154 mg/dL — ABNORMAL HIGH (ref 70–99)
Glucose-Capillary: 185 mg/dL — ABNORMAL HIGH (ref 70–99)
Glucose-Capillary: 199 mg/dL — ABNORMAL HIGH (ref 70–99)
Glucose-Capillary: 215 mg/dL — ABNORMAL HIGH (ref 70–99)
Glucose-Capillary: 333 mg/dL — ABNORMAL HIGH (ref 70–99)
Glucose-Capillary: 361 mg/dL — ABNORMAL HIGH (ref 70–99)
Glucose-Capillary: 166 mg/dL — ABNORMAL HIGH (ref 70–99)
Glucose-Capillary: 174 mg/dL — ABNORMAL HIGH (ref 70–99)
Glucose-Capillary: 188 mg/dL — ABNORMAL HIGH (ref 70–99)
Glucose-Capillary: 205 mg/dL — ABNORMAL HIGH (ref 70–99)
Glucose-Capillary: 218 mg/dL — ABNORMAL HIGH (ref 70–99)
Glucose-Capillary: 242 mg/dL — ABNORMAL HIGH (ref 70–99)
Glucose-Capillary: 258 mg/dL — ABNORMAL HIGH (ref 70–99)
Glucose-Capillary: 273 mg/dL — ABNORMAL HIGH (ref 70–99)
Glucose-Capillary: 307 mg/dL — ABNORMAL HIGH (ref 70–99)
Glucose-Capillary: 325 mg/dL — ABNORMAL HIGH (ref 70–99)
Glucose-Capillary: 66 mg/dL — ABNORMAL LOW (ref 70–99)

## 2011-04-10 LAB — APTT: aPTT: 46 seconds — ABNORMAL HIGH (ref 24–37)

## 2011-04-10 LAB — PROTIME-INR
INR: 3.2 — ABNORMAL HIGH (ref 0.00–1.49)
INR: 5.3 (ref 0.00–1.49)
INR: 6.1 (ref 0.00–1.49)
Prothrombin Time: 35.2 seconds — ABNORMAL HIGH (ref 11.6–15.2)
Prothrombin Time: 53.8 seconds — ABNORMAL HIGH (ref 11.6–15.2)
Prothrombin Time: 60.8 seconds — ABNORMAL HIGH (ref 11.6–15.2)

## 2011-04-10 LAB — DIFFERENTIAL
Basophils Absolute: 0 10*3/uL (ref 0.0–0.1)
Basophils Relative: 0 % (ref 0–1)
Eosinophils Absolute: 0.2 10*3/uL (ref 0.0–0.7)
Eosinophils Relative: 2 % (ref 0–5)
Monocytes Absolute: 0.9 10*3/uL (ref 0.1–1.0)
Monocytes Relative: 8 % (ref 3–12)

## 2011-04-10 LAB — ABO/RH: ABO/RH(D): A POS

## 2011-04-10 LAB — CARDIAC PANEL(CRET KIN+CKTOT+MB+TROPI)
Relative Index: INVALID (ref 0.0–2.5)
Total CK: 53 U/L (ref 7–232)
Total CK: 56 U/L (ref 7–232)

## 2011-04-10 LAB — FOLATE RBC: RBC Folate: 2773 ng/mL — ABNORMAL HIGH (ref 180–600)

## 2011-04-10 LAB — H. PYLORI ANTIBODY, IGG: H Pylori IgG: 0.4 {ISR}

## 2011-04-10 LAB — HEMOGLOBIN A1C
Hgb A1c MFr Bld: 7.8 % — ABNORMAL HIGH (ref 4.6–6.1)
Mean Plasma Glucose: 177 mg/dL

## 2011-04-10 LAB — CLOTEST (H. PYLORI), BIOPSY: Helicobacter screen: NEGATIVE

## 2011-04-10 LAB — GLUCOSE, RANDOM: Glucose, Bld: 474 mg/dL — ABNORMAL HIGH (ref 70–99)

## 2011-04-12 ENCOUNTER — Other Ambulatory Visit: Payer: Self-pay | Admitting: *Deleted

## 2011-04-12 MED ORDER — ISOSORBIDE MONONITRATE ER 60 MG PO TB24: 60.0000 mg | ORAL_TABLET | Freq: Every day | ORAL | Status: DC

## 2011-04-20 ENCOUNTER — Encounter: Payer: Self-pay | Admitting: Pulmonary Disease

## 2011-04-23 ENCOUNTER — Ambulatory Visit (INDEPENDENT_AMBULATORY_CARE_PROVIDER_SITE_OTHER): Payer: Medicare Other | Admitting: Pulmonary Disease

## 2011-04-23 ENCOUNTER — Encounter: Payer: Self-pay | Admitting: Pulmonary Disease

## 2011-04-23 VITALS — BP 128/70 | HR 73 | Temp 98.0°F | Ht 63.0 in | Wt 247.4 lb

## 2011-04-23 DIAGNOSIS — J449 Chronic obstructive pulmonary disease, unspecified: Secondary | ICD-10-CM

## 2011-04-23 NOTE — Progress Notes (Signed)
  Subjective:    Patient ID: [name], male    DOB: 10-24-1941, 70 y.o.   MRN: [id number]  HPI The pt comes in today for f/u of his known emphysema.  He is maintaining a stable baseline on his current meds, and has not had a recent exac or pulm infxn.  He denies any chest congestion or significant cough.  He is scheduled to have upcoming gastric lap band surgery   Review of Systems  Constitutional: Negative for fever and unexpected weight change.  HENT: Positive for congestion, sore throat, sneezing, postnasal drip and sinus pressure. Negative for ear pain, nosebleeds, rhinorrhea, trouble swallowing and dental problem.   Eyes: Positive for redness and itching.  Respiratory: Positive for cough, shortness of breath and wheezing. Negative for chest tightness.   Cardiovascular: Positive for leg swelling. Negative for palpitations.  Gastrointestinal: Negative for nausea and vomiting.  Genitourinary: Negative for dysuria.  Musculoskeletal: Positive for joint swelling.  Skin: Negative for rash.  Neurological: Negative for headaches.  Hematological: Does not bruise/bleed easily.  Psychiatric/Behavioral: Negative for dysphoric mood. The patient is not nervous/anxious.        Objective:   Physical Exam Obese male in nad Chest with mild decrease in bs, no wheezing Cor with rrr LE with mild edema, no cyanosis Alert and oriented, moves all 4        Assessment & Plan:

## 2011-04-23 NOTE — Patient Instructions (Signed)
No change in meds Please call if breathing issues occur after your surgical procedure followup with me in 6mos

## 2011-04-23 NOTE — Assessment & Plan Note (Signed)
The pt is doing well on his current regimen, with no recent acute exacerbation or infection.  He feels his exertional tolerance is stable, and is due for upcoming lap band surgery.  I have recommended no change in his meds.

## 2011-04-24 ENCOUNTER — Encounter: Payer: Self-pay | Admitting: Endocrinology

## 2011-04-26 ENCOUNTER — Encounter: Payer: Medicare Other | Attending: Surgery

## 2011-04-26 ENCOUNTER — Encounter: Payer: Self-pay | Admitting: Endocrinology

## 2011-04-26 ENCOUNTER — Ambulatory Visit (INDEPENDENT_AMBULATORY_CARE_PROVIDER_SITE_OTHER): Payer: Medicare Other | Admitting: Endocrinology

## 2011-04-26 DIAGNOSIS — Z713 Dietary counseling and surveillance: Secondary | ICD-10-CM | POA: Insufficient documentation

## 2011-04-26 DIAGNOSIS — E109 Type 1 diabetes mellitus without complications: Secondary | ICD-10-CM

## 2011-04-26 DIAGNOSIS — Z01818 Encounter for other preprocedural examination: Secondary | ICD-10-CM | POA: Insufficient documentation

## 2011-04-26 NOTE — Patient Instructions (Addendum)
reduce humalog to (just before each meal), 30-10-30 units Reduce nph to 30 units at night. After your surgery, i anticipate dr Ezzard Standing will advise you to stop your insulin, and i would agree. Please make a follow-up appointment in 3 weeks. check your blood sugar 2 times a day.  vary the time of day when you check, between before the 3 meals, and at bedtime.  also check if you have symptoms of your blood sugar being too high or too low.  please keep a record of the readings and bring it to your next appointment here.  please call us sooner if you are having low blood sugar episodes.

## 2011-04-26 NOTE — Progress Notes (Signed)
  Subjective:    Patient ID: [name], male    DOB: September 25, 1941, 70 y.o.   MRN: [id number]  HPI Pt is scheduled for gastric banding, 11 days from now.  He is on the preop diet.  Since then, he is having hypoglycemia, despite reducing his insulin.  pt states he feels well in general.  Review of Systems Denies loc    Objective:   Physical Exam    General:  morbidly obese.  no distress Pulses:  dorsalis pedis intact bilat Extremities:  no deformity.  no ulcer on the feet.  feet are of normal color and temp.   trace right pedal edema and trace left pedal edema.   Neurologic:  sensation is intact to touch on the feet   Assessment & Plan:  Dm, now overcontrolled due to pre- bariatric surgery diet.

## 2011-04-27 ENCOUNTER — Encounter: Payer: Self-pay | Admitting: Internal Medicine

## 2011-05-02 ENCOUNTER — Encounter (HOSPITAL_COMMUNITY): Payer: Medicare Other

## 2011-05-02 ENCOUNTER — Other Ambulatory Visit: Payer: Self-pay | Admitting: Surgery

## 2011-05-02 ENCOUNTER — Telehealth: Payer: Self-pay | Admitting: Cardiovascular Disease

## 2011-05-02 LAB — COMPREHENSIVE METABOLIC PANEL
ALT: 16 U/L (ref 0–53)
Alkaline Phosphatase: 86 U/L (ref 39–117)
CO2: 30 mEq/L (ref 19–32)
GFR calc non Af Amer: 38 mL/min — ABNORMAL LOW (ref >60)
Glucose, Bld: 88 mg/dL (ref 70–99)
Potassium: 4.5 mEq/L (ref 3.5–5.1)
Sodium: 131 mEq/L — ABNORMAL LOW (ref 135–145)

## 2011-05-02 LAB — DIFFERENTIAL
Basophils Absolute: 0 10*3/uL (ref 0.0–0.1)
Lymphocytes Relative: 14 % (ref 12–46)
Lymphs Abs: 1.5 10*3/uL (ref 0.7–4.0)
Monocytes Absolute: 0.8 10*3/uL (ref 0.1–1.0)
Neutro Abs: 8.2 10*3/uL — ABNORMAL HIGH (ref 1.7–7.7)

## 2011-05-02 LAB — SURGICAL PCR SCREEN: Staphylococcus aureus: POSITIVE — AB

## 2011-05-02 LAB — CBC
HCT: 38.9 % — ABNORMAL LOW (ref 39.0–52.0)
Hemoglobin: 13 g/dL (ref 13.0–17.0)
MCHC: 33.4 g/dL (ref 30.0–36.0)
WBC: 10.8 10*3/uL — ABNORMAL HIGH (ref 4.0–10.5)

## 2011-05-02 NOTE — Telephone Encounter (Signed)
Faxed OV & Stress to [facility] ([phone]).

## 2011-05-03 ENCOUNTER — Ambulatory Visit: Payer: Medicare Other

## 2011-05-07 ENCOUNTER — Encounter: Payer: Self-pay | Admitting: Internal Medicine

## 2011-05-07 ENCOUNTER — Inpatient Hospital Stay (HOSPITAL_COMMUNITY)
Admission: EM | Admit: 2011-05-07 | Discharge: 2011-05-09 | DRG: 155 | Disposition: A | Discharge status: Home / Self Care | Payer: Medicare Other | Attending: Internal Medicine | Admitting: Internal Medicine

## 2011-05-07 ENCOUNTER — Emergency Department (HOSPITAL_COMMUNITY): Payer: Medicare Other

## 2011-05-07 DIAGNOSIS — IMO0002 Reserved for concepts with insufficient information to code with codable children: Secondary | ICD-10-CM | POA: Diagnosis present

## 2011-05-07 DIAGNOSIS — E876 Hypokalemia: Secondary | ICD-10-CM | POA: Diagnosis present

## 2011-05-07 DIAGNOSIS — N183 Chronic kidney disease, stage 3 unspecified: Secondary | ICD-10-CM | POA: Diagnosis present

## 2011-05-07 DIAGNOSIS — E871 Hypo-osmolality and hyponatremia: Secondary | ICD-10-CM | POA: Diagnosis present

## 2011-05-07 DIAGNOSIS — Z86711 Personal history of pulmonary embolism: Secondary | ICD-10-CM

## 2011-05-07 DIAGNOSIS — E119 Type 2 diabetes mellitus without complications: Secondary | ICD-10-CM | POA: Diagnosis present

## 2011-05-07 DIAGNOSIS — E785 Hyperlipidemia, unspecified: Secondary | ICD-10-CM | POA: Diagnosis present

## 2011-05-07 DIAGNOSIS — J449 Chronic obstructive pulmonary disease, unspecified: Secondary | ICD-10-CM | POA: Diagnosis present

## 2011-05-07 DIAGNOSIS — I509 Heart failure, unspecified: Secondary | ICD-10-CM | POA: Diagnosis present

## 2011-05-07 DIAGNOSIS — I5032 Chronic diastolic (congestive) heart failure: Secondary | ICD-10-CM | POA: Diagnosis present

## 2011-05-07 DIAGNOSIS — Z79899 Other long term (current) drug therapy: Secondary | ICD-10-CM

## 2011-05-07 DIAGNOSIS — Z7982 Long term (current) use of aspirin: Secondary | ICD-10-CM

## 2011-05-07 DIAGNOSIS — Z794 Long term (current) use of insulin: Secondary | ICD-10-CM

## 2011-05-07 DIAGNOSIS — Z87891 Personal history of nicotine dependence: Secondary | ICD-10-CM

## 2011-05-07 DIAGNOSIS — I10 Essential (primary) hypertension: Secondary | ICD-10-CM | POA: Diagnosis present

## 2011-05-07 DIAGNOSIS — K219 Gastro-esophageal reflux disease without esophagitis: Secondary | ICD-10-CM | POA: Diagnosis present

## 2011-05-07 DIAGNOSIS — I129 Hypertensive chronic kidney disease with stage 1 through stage 4 chronic kidney disease, or unspecified chronic kidney disease: Secondary | ICD-10-CM | POA: Diagnosis present

## 2011-05-07 DIAGNOSIS — T17308A Unspecified foreign body in larynx causing other injury, initial encounter: Principal | ICD-10-CM | POA: Diagnosis present

## 2011-05-07 DIAGNOSIS — I2510 Atherosclerotic heart disease of native coronary artery without angina pectoris: Secondary | ICD-10-CM | POA: Diagnosis present

## 2011-05-07 DIAGNOSIS — Y92009 Unspecified place in unspecified non-institutional (private) residence as the place of occurrence of the external cause: Secondary | ICD-10-CM

## 2011-05-07 DIAGNOSIS — J4489 Other specified chronic obstructive pulmonary disease: Secondary | ICD-10-CM | POA: Diagnosis present

## 2011-05-07 LAB — POCT I-STAT, CHEM 8
Glucose, Bld: 240 mg/dL — ABNORMAL HIGH (ref 70–99)
HCT: 42 % (ref 39.0–52.0)
Hemoglobin: 14.3 g/dL (ref 13.0–17.0)
Potassium: 4 mEq/L (ref 3.5–5.1)
Sodium: 128 mEq/L — ABNORMAL LOW (ref 135–145)

## 2011-05-07 LAB — DIFFERENTIAL
Basophils Absolute: 0 10*3/uL (ref 0.0–0.1)
Basophils Relative: 0 % (ref 0–1)
Eosinophils Absolute: 0.2 10*3/uL (ref 0.0–0.7)
Eosinophils Relative: 2 % (ref 0–5)
Monocytes Absolute: 0.8 10*3/uL (ref 0.1–1.0)

## 2011-05-07 LAB — CBC
HCT: 38.3 % — ABNORMAL LOW (ref 39.0–52.0)
MCHC: 33.9 g/dL (ref 30.0–36.0)
RDW: 14.4 % (ref 11.5–15.5)

## 2011-05-07 LAB — D-DIMER, QUANTITATIVE: D-Dimer, Quant: 0.84 ug/mL-FEU — ABNORMAL HIGH (ref 0.00–0.48)

## 2011-05-08 ENCOUNTER — Observation Stay (HOSPITAL_COMMUNITY): Payer: Medicare Other

## 2011-05-08 ENCOUNTER — Inpatient Hospital Stay (HOSPITAL_COMMUNITY): Admission: RE | Admit: 2011-05-08 | Payer: Medicare Other | Source: Ambulatory Visit | Admitting: Surgery

## 2011-05-08 DIAGNOSIS — I517 Cardiomegaly: Secondary | ICD-10-CM

## 2011-05-08 LAB — CARDIAC PANEL(CRET KIN+CKTOT+MB+TROPI)
CK, MB: 4.5 ng/mL — ABNORMAL HIGH (ref 0.3–4.0)
Relative Index: 3.2 — ABNORMAL HIGH (ref 0.0–2.5)
Total CK: 112 U/L (ref 7–232)
Troponin I: <0.3 ng/mL (ref <0.30)
Troponin I: <0.3 ng/mL (ref <0.30)

## 2011-05-08 LAB — GLUCOSE, CAPILLARY
Glucose-Capillary: 197 mg/dL — ABNORMAL HIGH (ref 70–99)
Glucose-Capillary: 230 mg/dL — ABNORMAL HIGH (ref 70–99)

## 2011-05-08 LAB — CBC
HCT: 35.2 % — ABNORMAL LOW (ref 39.0–52.0)
MCV: 87.1 fL (ref 78.0–100.0)
RBC: 4.04 MIL/uL — ABNORMAL LOW (ref 4.22–5.81)
RDW: 14.5 % (ref 11.5–15.5)
WBC: 10.1 10*3/uL (ref 4.0–10.5)

## 2011-05-08 LAB — BASIC METABOLIC PANEL
CO2: 27 mEq/L (ref 19–32)
Calcium: 9.8 mg/dL (ref 8.4–10.5)
Creatinine, Ser: 1.68 mg/dL — ABNORMAL HIGH (ref 0.4–1.5)
GFR calc Af Amer: 49 mL/min — ABNORMAL LOW (ref >60)
GFR calc non Af Amer: 41 mL/min — ABNORMAL LOW (ref >60)
Glucose, Bld: 209 mg/dL — ABNORMAL HIGH (ref 70–99)
Sodium: 131 mEq/L — ABNORMAL LOW (ref 135–145)

## 2011-05-08 LAB — HEPARIN LEVEL (UNFRACTIONATED): Heparin Unfractionated: 0.26 IU/mL — ABNORMAL LOW (ref 0.30–0.70)

## 2011-05-08 MED ORDER — TECHNETIUM TO 99M ALBUMIN AGGREGATED
6.0000 | Freq: Once | INTRAVENOUS | Status: AC | PRN
  Administered 2011-05-08: 6 via INTRAVENOUS

## 2011-05-08 MED ORDER — XENON XE 133 GAS
10.0000 | GAS_FOR_INHALATION | Freq: Once | RESPIRATORY_TRACT | Status: AC | PRN
  Administered 2011-05-08: 10 via RESPIRATORY_TRACT

## 2011-05-08 NOTE — Assessment & Plan Note (Signed)
[facility]                            CARDIOLOGY OFFICE NOTE   [name], [name]                        MRN:          [id number]  DATE:07/08/2008                            DOB:          02/14/1941    PRIMARY CARE PHYSICIAN:  Sean A. [name] All, MD   CLINICAL HISTORY:  [name] is a 70 years old who returns for  management of his diastolic heart failure and coronary artery disease.  When I saw him last in December, we thought his volume status was close  to the best we could do.  He does have chronic volume overload and now  has some renal insufficiency.  We have been trying to titrate the two.  Dr. Everardo [name] cut back on his Lasix several weeks ago from 80 three  tablets twice a day to 80 three tablets once a day because of increasing  worsening renal function.  He feels like he has gained some fluid since  that time.  He has had no chest pain or palpitations.   PAST MEDICAL HISTORY:  Significant for the problems outlined below.   CURRENT MEDICATIONS:  Insulin, metoclopramide, Prilosec, Taztia,  bisoprolol, calcitriol, Uroxatral, iron, Aggrenox, isosorbide, aspirin,  Zyrtec, Coumadin, Vytorin, allopurinol, trazodone, metolazone 2.5 mg  once a week, and furosemide 80 mg 3 tablets in the morning.   PHYSICAL EXAMINATION:  VITAL SIGNS:  The blood pressure was 106/65 and  pulse 73 and regular.  NECK:  Venous pulsation was visible at the distal clavicle.  The carotid  pulses were full.  CHEST:  Clear.  HEART:  Rhythm is regular.  I could hear no murmurs.  ABDOMEN:  Quite protuberant.  The abdomen was soft with normal bowel  sounds.  EXTREMITIES:  There was 2+ peripheral edema.   IMPRESSION:  1. Diastolic heart failure with moderate volume overload.  2. Chronic renal insufficiency, creatinines in the range of 1.8.  3. Coronary artery, status post multiple percutaneous interventions      with nonobstructive disease at last catheterization.  4. Good  left ventricular systolic function.  5. Insulin diabetes.  6. Hypertension.  7. Hyperlipidemia.  8. History of pulmonary embolism on Coumadin therapy.  9. Gastroesophageal reflux disease.   RECOMMENDATIONS:  [name] has some increasing volume overload.  He  has gained 9 pounds since his last visit here.  We may be able to  compromise on his diuretic dosage.  I asked him to take an extra 80 mg  of Lasix in the afternoon, so he will take three 80 mg tablets in the  morning and 1 in the afternoon.  He is also on Zaroxolyn 2.5 mg 1 day a  week.  We will get a BNP in 1 week.  He plans to see Dr. Everardo [name] back in  3 months.  I will see him back in 6 months.     Bruce Elvera Lennox [name], MD, [facility]  Electronically Signed    BRB/MedQ  DD: 07/08/2008  DT: 07/09/2008  Job #: [id number]

## 2011-05-08 NOTE — Assessment & Plan Note (Signed)
[facility]                            CARDIOLOGY OFFICE NOTE   [name], [name]                        MRN:          [id number]  DATE:11/12/2007                            DOB:          11-Mar-1941    This is a 70 year old, married, white male patient of Dr. Charlies [name]  that has a history of chronic diastolic dysfunction and history of heart  failure related to this. He has had a lot of trouble over the past  several months mainly due to dietary indiscretion. He was hospitalized  after he was placed on Zaroxolyn and became dehydrated. Since then, he  has been on Lasix 240 mg twice a day. He last saw Dr. Juanda [name] on October 13, 2007 at which time he was probably 4 pounds overweight, but he did  not change his Lasix dose.   The patient drove to [facility], [place], this past weekend and  said Saturday he had himself propped up on 4 pillows and could not sleep  because he was so short of breath. This has improved a little bit since  Saturday night, but he still gets short of breath with very little  exertion. He continues to eat out. He did eat at [employer] twice while  in [place]. Recently, he ate at the [employer] on Monday night. He  states he is seeing a dietician, but he just have a lot of trouble  following the restrictions. He says he does pretty well at home, but  when he eats out, he struggles.   CURRENT MEDICATIONS:  1. Humalog insulin.  2. Metoclopramide 10 mg q.i.d.  3. Omeprazole 20 mg daily.  4. Taztia 300 mg daily.  5. Bisoprolol 5 mg daily.  6. Calcitriol 25 mg daily.  7. Uroxatral 10 mg daily.  8. Folbic daily.  9. Iron 325 three a day.  10.Aggrenox 25/200 mg b.i.d.  11.Isosorbide 30 mg daily.  12.Aspirin 325 daily.  13.Zyrtec 10 mg daily.  14.Coumadin as directed.  15.Vytorin 10/80 mg daily.  16.Rozerem 8 mg every night.  17.Allopurinol 100 mg daily.  18.Trazodone 100 mg daily.  19.K-Dur 20 mEq b.i.d.  20.Lasix 80 mg 3 b.i.d.   PHYSICAL EXAMINATION:  This is an obese, 70 year old, white male in no  acute distress. Blood pressure 90/58, pulse 68, weight 244.  NECK:  Is without JVD, HJR, bruit or thyroid enlargement.  LUNGS:  Decreased breath sounds with some scattered expiratory wheezing.  HEART:  Regular rate and rhythm at 68 beats per minute. Normal S1 and  S2. No murmur, rub, bruit, thrill or heave noted.  ABDOMEN:  Is obese, soft without organomegaly.  EXTREMITIES:  He has 2+ edema in the lower extremities and some upper  thigh edema.   IMPRESSION:  1. Congestive heart failure secondary to diastolic dysfunction with      mild volume overload.  2. Dietary indiscretion.  3. Coronary artery disease status post multiple percutaneous coronary      interventions with nonobstructive disease at last catheterization.      He has tandem overlapping Taxus stent in  the proximal and mid RCA.  4. Diabetes mellitus.  5. Peripheral vascular disease.  6. Chronic renal insufficiency; creatinine in the 1.7 range.  7. Gastroesophageal reflux disease.  8. Hypertension.  9. Hyperlipidemia.  10.History of pulmonary embolism.  11.Coumadin therapy.  12.Good left ventricular function.   PLAN:  At this time, once again, I have discussed the importance of  dietary compliance. He needs to adjust where he is eating out and his  food choices. When recommending that he speak with a dietician, he  admitted that he has already been seeing [name] over at [facility] but  has trouble complying. I have opted to let him take Zaroxolyn 2.5 mg  once a week to see if this does not help. I am reluctant to put him on  any more of this because he did become dehydrated on this in the past,  and he is already hypotensive. He is to take 2 extra K-Dur on the days  he takes Zaroxolyn. I have urged to use caution over the Thanksgiving  holiday, and he will see Dr. Charlies [name] back in 2 weeks.      [name] Reedy, PA-C   Electronically Signed      [name] Beals. [name] Chance, MD, [facility]  Electronically Signed   ML/MedQ  DD: 11/12/2007  DT: 11/12/2007  Job #: [id number]

## 2011-05-08 NOTE — Assessment & Plan Note (Signed)
[name]                            CARDIOLOGY OFFICE NOTE   [name], [name]                        MRN:          [id number]  DATE:10/01/2007                            DOB:          Mar 25, 1941    This is a patient of Dr. Charlies Constable.  [name] is a 70 year old  white male patient who has chronic diastolic dysfunction and a history  of heart failure related to this.  He had some extra fluid on board when  he saw [name] on September 03, 2007 and Zaroxolyn was added.  The  patient then ended up becoming dehydrated any hypokalemic, and was  admitted to the hospital where he was given fluids.  He then saw [name] in followup on September 15, 2007 at which she reinstituted Lasix  240 mg in the morning, but not b.i.d. as he had been taking in the past.  Since he saw her the edema has actually worsened, and he complains of  dyspnea on exertion.  His weight is up 6 pounds.  He eats out quite a  bit at establishments such as [employer] and [employer] equivalents.  He spent the week in [facility] last week and ate out every evening.  Two days the fluid was so bad he could not get his shoes on.  He took an  extra 240 mg of Lasix in the evening on those days, and the edema  improved.   CURRENT MEDICATIONS:  1. Humalog insulin as directed.  2. Reglan 10 mg q.i.d.  3. Lasix 80 mg 3 in the morning.  4. Omeprazole 20 mg daily.  5. Tiazac 300 mg daily.  6. Bisoprolol 5 mg daily.  7. Calcitriol 0.5 mg daily.  8. Uroxatral 10 mg daily.  9. Folic acid daily.  10.Iron 325 three daily.  11.Aggrenox 25/200 b.i.d.  12.Isosorbide 30 mg daily.  13.Aspirin 325 daily.  14.Zyrtec 10 mg daily.  15.Coumadin as directed.  16.Vytorin 10/80 mg daily.  17.Rozerem 8 mg nightly.  18.Allopurinol 100 mg daily.  19.Trazodone 100 mg nightly.  20.K-Dur 20 mEq daily.   PHYSICAL EXAMINATION:  This is an obese 70 year old white male in no  acute distress.  Blood pressure 118/62, pulse 70, weight 246, up 6 pounds from last  visit.  NECK:  Slight JVD, no HJR or bruit.  LUNGS:  Clear, anterior, posterior and lateral without rales and  rhonchi.  HEART:  Regular rate and rhythm at 70 beats per minute.  Normal S1 and  S2 with a 1/6 systolic ejection murmur at the left sternal border.  ABDOMEN:  Protuberant, no hepatosplenomegaly.  EXTREMITIES:  3+ edema on the right lower extremity, 2+ on the left.  Positive distal pulses.   IMPRESSION:  1. Acute-on-chronic diastolic congestive heart failure.  2. Coronary artery disease, status post multiple percutaneous coronary      interventions with nonobstructive disease at last catheterization.      He has tandem overlapping Taxus stent in the proximal and mid right      coronary artery.  3. Diabetes mellitus.  4. Peripheral vascular disease.  5. Chronic renal insufficiency, creatinine is in the 1.7 range.  6. Gastroesophageal reflux disease.  7. Hypertension.  8. Hyperlipidemia.  9. History of pulmonary embolism.  10.Coumadin therapy.  11.Good left ventricular systolic function.  12.Dietary noncompliance.   PLAN:  I have opted to increase the patient's Lasix to 240 mg b.i.d. and  KCl to 20 mEq 2 twice a day.  I have stressed the importance of cutting  back on his sodium intake and eating out and losing weight.  We will  check a BMET and BNP today, and he will see [name] back in 1 month.  He is to call if he has any dramatic increase or decrease in weight.      [name], PA-C  Electronically Signed      Doylene Canning. Ladona Ridgel, MD  Electronically Signed   ML/MedQ  DD: 10/01/2007  DT: 10/01/2007  Job #: [id number]

## 2011-05-08 NOTE — H&P (Signed)
[name], [name] NO.:  [id number]   MEDICAL RECORD NO.:  [id number]          PATIENT TYPE:  EMS   LOCATION:  MAJO                         FACILITY:  [facility]   PHYSICIAN:  [name], MDDATE OF BIRTH:  Apr 15, 1941   DATE OF ADMISSION:  05/22/2007  DATE OF DISCHARGE:                              HISTORY & PHYSICAL   PRIMARY CARE PHYSICIAN:  [name], M.D.   CARDIOLOGISTEverardo Beals Juanda [name], M.D., F.A.C.C.   REASON FOR ADMISSION:  Chest pain and shortness of breath concerning for  unstable angina.   HISTORY OF PRESENT ILLNESS:  [name] is a 70 year old male with  multiple medical problems including diabetes, hypertension, chronic  renal insufficiency, hyperlipidemia, COPD, and coronary artery disease.  He is status post multiple coronary interventions, most recently with  rotational atherectomy and two long overlapping Taxus stent in the right  coronary artery in April of 2004.  In February 2007, he underwent  cardiac catheterization for an abnormal Cardiolite scan.  This showed a  normal left main.  LAD had 50% in the mid section and 50% in the first  diagonal.  The circumflex had a 50% narrowing in the marginal branch.  The RCA was a dominant vessel with 30% narrowing inside the stents with  30% narrowing in the distal and coronary artery, 40% narrowing distally,  and 50% in the PDA.  LV function was normal.   He last saw Dr. Juanda [name] about a month ago, at which time he was doing  fairly well.  He did have some diastolic heart failure with some volume  overload but did have any evidence of ischemia.  Over the last three  days, he has had chest pain as well as pain in his right neck and  shoulder.  The happened at rest but was worse with exertion.  He also  noticed progressive shortness of breath to the point where he could only  really walk across the room.  He has had not had orthopnea or lower  extremity edema.  Last night, he took some  nitroglycerin for the pain  and was able to go to sleep.  However, today he had developed recurrent  chest pain going to his jaw which was unrelieved with nitroglycerin.  The pain lasted most of the day, so he finally came to the emergency  room.  In the emergency room, his first set of point of care markers  were normal.  His EKG did not have any acute changes.  He was treated  with narcotics and nitroglycerin and the pain resolved.  He says now he  still feels short of breath and has some mild chest tightness but denies  any pain.   REVIEW OF SYSTEMS:  No fevers, chills, or cough.  He has not had any  bright red blood per rectum or melena.  He does have arthritis pain.  No  focal neurologic symptoms.  The remainder of the review of systems is  negative except for HPI and problem list.   PAST MEDICAL HISTORY:  1. Coronary artery disease.  a.     Status post previous percutaneous interventions on the LAD       and right coronary back in 1980 and 1990.      b.     Rotational atherectomy and placement of two Taxus drug-       eluting stents to the right coronary artery in April of 2004.      c.     Most recent catheterization, February 2007, with moderate       nonobstructive disease, as described in the HPI.  2. Diastolic heart failure.  3. Hypertension.  4. Hyperlipidemia.  5. COPD.  6. History of previous stroke.  7. History of remote PE.  8. Diabetes.  9. Chronic renal insufficiency, baseline creatinine about 2.  10.SHELLFISH ALLERGY.   CURRENT MEDICATIONS:  1. Aggrenox.  2. Toprol 50 mg, I suspect this is twice a day but he does not know      for sure.  3. Sular 40 mg a day.  4. Benazepril 20 mg a day.  5. Zetia 10 mg a day.  6. Meclizine 25 mg p.r.n.  7. Clonidine 0.1 mg twice a day.  8. Spironolactone 12.5 mg a day.  9. Pantoprazole 40 mg a day.   SOCIAL HISTORY:  He is married and lives with his wife.  He is retired  from [employer].  Tobacco - he previously smoked  1-2 packs per day but quit  about ten years ago.  No alcohol.   FAMILY HISTORY:  His mother died of lung cancer.  His father died in  03/30/23.  No history of premature coronary disease.  He has two healthy  brothers.   PHYSICAL EXAMINATION:  GENERAL:  He is in no acute distress.  VITAL SIGNS:  Blood pressure is 123/57, heart rate is 90, his  temperature is 97, and he is saturating 98% on room air.  HEENT:  Normal.  NECK:  Supple and thick.  It is hard to assess his JVD but appears flat.  Carotids are 2+ bilaterally with no bruits.  There is no lymphadenopathy  or thyromegaly.  CARDIAC:  He has a regular rate and rhythm with no obvious murmurs,  rubs, or gallops.  LUNGS:  Clear with diminished breath sounds throughout.  ABDOMEN:  Obese, nontender, nondistended.  No hepatosplenomegaly, no  bruits, no masses.  Good bowel sounds.  EXTREMITIES:  Warm with no cyanosis or clubbing.  There is trivial  edema.  No rash.  NEUROLOGIC:  He is alert and oriented x 3.  Cranial nerves II-XII are  intact.  He moves [name] four extremities without difficulty.   LABORATORY DATA:  Chest x-ray showed mild cardiomegaly and COPD, no  acute process.  EKG showed normal sinus rhythm at a rate of 83, no  significant ST/T-wave abnormalities.  White count is 6.8, hemoglobin is  10.9, platelets are 187,000.  Sodium 134, potassium 3.7, chloride 100,  bicarbonate 28, BUN 27, creatinine 1.9.  BNP is 30.  D-dimer is 0.36.  Troponin-I is less than 0.05.  CK-MB is less than 1.0.   ASSESSMENT/PLAN:  [name] of these symptoms are concerning for unstable  angina, although there is no objective evidence of ischemia.  He will  need to be treated with aspirin, beta blocker, heparin, intravenous  nitroglycerin, and a Statin.  We will plan cardiac catheterization in  the morning.  Given his renal insufficiency and shellfish allergy, he  will need a bicarbonate prep as well as contrast allergy prophylaxis.  I had a long talk with  him about the risks and benefits of cardiac  catheterization with particular attention to the risk of permanent renal  failure.  He is aware of these risks and agrees to proceed.  We will  hold his ACE inhibitor and spironolactone overnight.      [name] Buckles. Bensimhon, MD  Electronically Signed     DRB/MEDQ  D:  05/22/2007  T:  05/22/2007  Job:  [id number]   cc:   Gregary Signs A. [name] All, MD  [name] Beals [name], MD, [facility]

## 2011-05-08 NOTE — Discharge Summary (Signed)
NAMEOCEAN, SCHILDT NO.:  [id number]   MEDICAL RECORD NO.:  [id number]          PATIENT TYPE:  OBV   LOCATION:  4703                         FACILITY:  [facility]   PHYSICIAN:  Tereso Newcomer, PA-C     DATE OF BIRTH:  [birth year]   DATE OF ADMISSION:  09/12/2007  DATE OF DISCHARGE:  09/13/2007                               DISCHARGE SUMMARY   CARDIOLOGIST:  Everardo Beals. [name], MD, [facility].   PRIMARY CARE PHYSICIAN:  Sean A. Everardo All, MD.   Dictation ended at this point      Tereso Newcomer, PA-C     SW/MEDQ  D:  09/13/2007  T:  09/14/2007  Job:  [id number]   cc:   Gregary Signs A. Everardo All, MD

## 2011-05-08 NOTE — H&P (Signed)
[name], [name]               ACCOUNT NO.:  [id number]   MEDICAL RECORD NO.:  [id number]          PATIENT TYPE:  INP   LOCATION:  4703                         FACILITY:  [facility]   PHYSICIAN:  Everardo Beals. [name], MD, FACCDATE OF BIRTH:  1941-03-10   DATE OF ADMISSION:  01/13/2009  DATE OF DISCHARGE:                              HISTORY & PHYSICAL   PRIMARY CARE PHYSICIAN:  Sean A. Everardo All, MD   CHIEF COMPLAINT:  Swelling and shortness of breath.   CLINICAL HISTORY:  [name] is 70 years old and has a history of  coronary heart disease and diastolic heart failure.  Over the last  several months, he has developed increasing swelling and edema with  distention in his abdomen and swelling in his legs.  It has been more  and more difficult for him to get around.  He gets short of breath and  has difficulty walking with his increased weight.  He has had no recent  chest pain and no recent palpitations.   He has a history of diastolic heart failure in the past.  He also has  chronic renal insufficiency with creatinines in the range of 1.8.  He  has been on Lasix 80 mg 3 tablets b.i.d.   PAST MEDICAL HISTORY:  Significant for coronary artery disease with  multiple prior percutaneous coronary interventions and nonobstructive  disease at last catheterization.  He has a good LV systolic function.  He has insulin-dependent diabetes, hypertension, and hyperlipidemia.  He  has a history of pulmonary embolism, on Coumadin therapy.  He also has  GERD.   CURRENT MEDICATIONS:  1. Insulin.  2. Aggrenox.  3. Aspirin.  4. Furosemide 3 tablets b.i.d.  5. Bisoprolol 5 mg daily.  6. Coumadin.  7. Isosorbide.  8. Metoclopramide 10 mg q.i.d.  9. Allopurinol 100 mg daily.  10.Potassium 20 mEq b.i.d.  11.Prilosec 20 mg daily.  12.Taztia XT 300 mg daily.  13.Zyrtec 10 mg daily.  14.Trazodone 100 mg at bedtime.  15.Iron 325 mg t.i.d.  16.Calcitriol 25 mg daily.  17.Hydrocodone 5/500 p.r.n.  18.Combivent inhaler.  19.Flomax 0.5 daily.  20.Metolazone 2.5 mg daily.  21.Symbicort 2 puffs b.i.d.  22.Spiriva HandiHaler.   For family history and social history, please see old chart.   PHYSICAL EXAMINATION:  VITAL SIGNS:  Blood pressure was 115/72.  NECK:  Venous pulsations were difficult to see, but I think they were up  to the jaw.  The carotid pulses were full without bruits.  CHEST:  Clear without rales or rhonchi.  CARDIAC:  Rhythm was regular.  The heart sounds were decreased.  There  was a short systolic murmur at the left sternal edge.  ABDOMEN:  Quite protuberant and it was difficult to feel.  The bowel  sounds were present.  There was no tenderness.  EXTREMITIES:  There was 3+ edema in the lower extremities.  The pedal  pulses were difficult to feel through the edema.  MUSCULOSKELETAL:  No deformities.  SKIN:  Warm and dry.  NEUROLOGIC:  No focal neurological signs.   IMPRESSION:  1. Diastolic heart failure  with marked volume overload.  2. Coronary artery disease status post multiple percutaneous coronary      interventions with nonobstructive disease at last catheterization.  3. Good left ventricular systolic function.  4. Chronic renal insufficiency with creatinine in the range of 1.8.  5. Insulin-dependent diabetes.  6. Hypertension.  7. Hyperlipidemia.  8. History of pulmonary embolism, on Coumadin therapy.  9. Gastroesophageal reflux disease.   RECOMMENDATIONS:  Mr. Stiehl weight is up 270 pounds today compared  to 251 pounds last July and compared to 242 pounds in March.  I think he  has at least 25 pounds of fluid and possibly more, and I think  hospitalization is the best way to get this off.  We will plan to admit  him in the hospital.  We will start with IV Lasix and Zaroxolyn but I  will also consider Aquapheresis.      Bruce Elvera Lennox [name], MD, [facility]  Electronically Signed     BRB/MEDQ  D:  01/13/2009  T:  01/13/2009  Job:  [id number]

## 2011-05-08 NOTE — Discharge Summary (Signed)
[name] NO.:  [id number]   MEDICAL RECORD NO.:  [id number]          PATIENT TYPE:  INP   LOCATION:  3732                         FACILITY:  [facility]   PHYSICIAN:  Madolyn Frieze. Jens Som, MD, FACCDATE OF BIRTH:  September 14, 1941   DATE OF ADMISSION:  02/06/2009  DATE OF DISCHARGE:  02/13/2009                               DISCHARGE SUMMARY   PRIMARY CARDIOLOGIST:  Everardo Beals. [name], MD, [facility]   PRIMARY CARE PHYSICIAN:  Not listed.   PROCEDURE PERFORMED DURING HOSPITALIZATION:  Endoscopy.   FINAL DISCHARGE DIAGNOSES:  1. Status post gastrointestinal bleed.  2. Chronic diastolic congestive heart failure.  3. Hypokalemia.  4. Renal insufficiency.  5. Diabetes.  6. History of pulmonary embolus.  7. Hypertension.  8. Elevated cholesterol.   HOSPITAL COURSE:  This is a 70 year old Caucasian male with history of  CAD, status post PCI who presents with chest pain.  The chest pain  described as burning across the chest going down his left arm associated  with shortness of breath.  The patient was admitted to rule out  myocardial infarction in the setting of known CAD.  The patient also had  a history of CHF and chest x-ray did not reveal any evidence of this.  The patient's admission hemoglobin was 8.  The patient's chest x-ray  revealed cardiomegaly with right base scarring or atelectasis.  The  patient's Coumadin was discontinued secondary to an elevated INR of 5.3  in the setting of anemia.  The patient was given blood transfusion  during hospitalization.  He received 6 units of packed red blood cells  during hospitalization in total.   On February 07, 2009, GI consult was requested.  Dr. Arlyce Dice saw the  patient and subsequent endoscopy was completed on February 08, 2009.  An  endoscopy revealed ulcer in the antrum caused transient bleed in the  phase of supratherapeutic INR, erosions multiple in the antrum, CLL  biopsy taken and pending; otherwise normal  examination.   The patient was also found to have some hypokalemia and a p.o. potassium  was given to be at 40 mEq t.i.d. with subsequent hemoglobin/hematocrit  and BMETs drawn daily to evaluate the patient's status.  The patient was  started on Zaroxolyn 2.5 mg Monday, Wednesday, and Friday in the setting  of CHF to be added to Lasix 80 mg b.i.d.   On February 13, 2009, the patient's potassium was still found to be low  at 2.8 and the patient was given additional 40 mEq of potassium, which  is added to his t.i.d. dose.  The patient had a followup potassium drawn  prior to discharge and was found to be free.  The patient was given an  additional 40 mEq of potassium prior to discharge and is to have a  followup appointment with Dr. [name] in 3 weeks with a followup BMET  drawn within 24 hours.  The patient's Coumadin will be continued to be  on hold in the setting of the GI bleed to be reinstituted at a later  date of the discretion of Dr. [name].  In  the interim, the patient will  continue his medications prior to admission with the addition of  Zaroxolyn 2.5 mg on Monday, Wednesday, Friday, and potassium 40 mEq 3  times a day.  He will follow up in the [facility]  as scheduled prior to following up with Dr. [name].   DISCHARGE LABORATORY DATA:  Sodium 131, potassium is 3.0, chloride 86,  CO2 37, glucose 227, BUN 28, creatinine 1.82, hemoglobin 11.7,  hematocrit 33.1, white blood cells 8.1, platelets 230.  D-dimer less  than 0.22.  H. pylori was found to be negative.  Chest x-ray on  discharge revealing low lung volumes.  Bibasilar atelectasis was noted.  The patient's weight on discharge was not listed, on admission it was  115.2.  Blood pressure on discharge 126/60, pulse 69, respirations 20,  temperature 98.6, and O2 sat 99% on 2 liters.  EKG on discharge, normal  sinus rhythm with ventricular rate of 72 beats per minute.   DISCHARGE MEDICATIONS:  1. Novolin  insulin 40 in the a.m., 15 units at lunchtime, and 60 units      at bedtime.  2. NPH insulin 60 units at nightly.  3. Aggrenox 25 mg/200 mg daily.  4. Lasix 80 mg p.o. b.i.d.  5. Bisoprolol 5 mg daily.  6. Imdur 30 mg daily.  7. Reglan 10 mg 4 times a day with meals.  8. Allopurinol 100 mg daily.  9. K-Dur 40 mEq t.i.d.  10.Protonix 20 mg daily.  11.Taztia XT 300 mg daily.  12.Zyrtec 10 mg daily.  13.Vytorin 10/80 daily.  14.Trazodone 100 mg daily.  15.Iron 325 mg t.i.d.  16.Flomax 10 mg daily.  17.Symbicort 160/4.5 mg 2 puffs daily.  18.Spiriva 18 mcg inhaler 1 puff daily.   ALLERGIES:  SHELLFISH, ACE INHIBITOR, CALCIUM CHANNEL BLOCKERS, IODINE.   FOLLOWUP PLANS AND APPOINTMENT:  1. The patient will follow up with [facility] and a      previously scheduled appointment in 1 week.  2. The patient will be seen at the [facility] to have a      BMET drawn on February 14, 2009.  3. The patient will follow with Dr. [name] in 2-3 weeks.  As this is a      weekend in our office, we will call to make followup appointments      with the patient.   Time spent with the patient to include physician time 45 minutes.      Bettey Mare. Lyman Bishop, NP      Madolyn Frieze. Jens Som, MD, [facility]  Electronically Signed    KML/MEDQ  D:  02/13/2009  T:  02/14/2009  Job:  [id number]

## 2011-05-08 NOTE — Assessment & Plan Note (Signed)
[facility]                            CARDIOLOGY OFFICE NOTE   DANIELL, MANCINAS                        MRN:          [id number]  DATE:06/10/2007                            DOB:          1941/10/25    PRIMARY CARDIOLOGIST:  Dr. Charlies Constable   PRIMARY CARE Lonn Im:  Dr. Gregary Signs A. Everardo All   PATIENT PROFILE:  A 70 year old married Caucasian male with recent  admission for chest pain and dyspnea.  He presents for followup.   PROBLEM LIST:  1. Coronary artery disease.      a.     Status post percutaneous interventions to the left anterior       descending and right coronary artery back in the 1980 and then       1990.      b.     Status post rotational atherectomy and placement of 2 Taxus       drug-eluting stents to the right coronary artery in April 2004.      c.     Catheterization in February 2007 and May 23, 2007 revealing       nonobstructive coronary artery disease with patent stents in the       right coronary.  2. Atrial fibrillation diagnosed May 2008.  3. Hypertension.  4. Hyperlipidemia.  5. Chronic obstructive pulmonary disease.  6. History of previous cardiovascular accident.  7. Remote history of pulmonary embolism.  8. Type 2 diabetes mellitus.  9. 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  10.SHELLFISH allergy.  11.Gastroesophageal reflux disease.  12.Obesity.   HISTORY OF PRESENT ILLNESS:  A 70 year old married Caucasian male with  the above problem list who was admitted to [facility] from May 29 to  May 30, 2007.  He was initially admitted for chest pain with dyspnea  with catheterization revealing nonobstructive disease and patent stent.  Also during that admission he was found to have atrial fibrillation  while in the cath lab, treated initially with IV diltiazem and then  converted to p.o. diltiazem.  He has been anticoagulated with Coumadin,  has been following up with the  C[facility]madin Clinic.  Since his discharge he  has done reasonably well, although he has been inactive, secondary to  the high heat outside, as well some left foot pain that he has been  having, which he said is secondary to compression hose which were left  on for a prolonged period of time during his hospitalization.  His left  foot has been swollen on the dorsal surface with some ecchymosis of the  toes.  He has not experienced any chest pain and says his breathing is  at least at baseline.  He denies any wheezing.  He does sleep on several  pillows at night but this is baseline for him.  Perhaps most of note is  that when he sleeps, after about 45 minutes to an hour of sleep he wakes  up with a feeling of panic saying that it feels as though someone has  taken the air from  him.  He has never been evaluated for sleep apnea.  He otherwise denies dizziness, syncope, edema (with the exception of his  left foot) or early satiety.   ALLERGIES:  SHELLFISH and ACE-INHIBITOR cause cough.   HOME MEDICATIONS:  1. Bisoprolol 5 mg daily.  2. Coumadin as directed.  3. Imdur 30 mg daily.  4. Metoclopramide 10 mg q.i.d.  5. Diltiazem 300 mg daily.  6. Calcitriol 25 mg daily.  7. Lasix 80 mg 3 tablets b.i.d.  8. Zyrtec 10 mg daily.  9. Ferrous sulfate 325 mg daily.  10.Humalog as directed.  11.Humulin N as directed.  12.Trazodone 100 mg nightly.  13.Vytorin 10/80 mg daily.  14.Omeprazole 20 mg daily.  15.Aggrenox  25/200 mg b.i.d.  16.Uroxatral 10 mg daily.  17.Aspirin 325 mg daily.   PHYSICAL EXAMINATION:  His weight is 234 pounds, down 7 pounds since the  last visit in April 2008.  Blood pressure 124/72, heart rate 69,  respiration is 18.  A pleasant black male in no acute distress.  Awake, alert and oriented  x3.  NECK:  Obese without bruits or JVD.  LUNGS:  Respirations regular, unlabored.  Clear to auscultation.  CARDIAC:  Regular S1, S2.  No S3, S4, murmurs.  ABDOMEN:  Obese, soft,  nontender, nondistended.  Bowel sounds present  x4.  EXTREMITIES:  Warm, dry, pink.  No clubbing or cyanosis.  He does have  trace left pedal edema as well as an erythematous and swollen area on  the dorsal surface of his left foot.  This is slightly warmer to touch  than the remainder of his foot.  There is also ecchymosis noted of the  proximal second, third and fourth toes.  The erythematous area is  nontender.  His left groin site which was used for cardiac  catheterization was clear of bleeding, bruits or hematoma.   ACCESSORY CLINICAL FINDINGS:  A CBC to reevaluate his WBCs which were  elevated on discharge as well as a BMET to reevaluate his creatinine are  currently pending.  ECG shows sinus rhythm with the PVC, no acute ST-T  changes.   ASSESSMENT:  1. Coronary artery disease.  The patient is recently status post      catheterization revealing patent stents and otherwise      nonobstructive disease.  He remains on medical therapy including      beta blocker, aspirin, statin, nitrate therapy.  He had previously      been on an angiotensin-receptor blocker which was discontinued in      the hospital secondary to contrast induced nephropathy.  His blood      pressure is well controlled and we will obtain a BMET today.  If      his BMET looks okay we could consider reinitiating that      angiotension-receptor blocker .  2. Atrial fibrillation.  He is currently in sinus rhythm.  He remains      on diltiazem therapy as well as Coumadin.  He denies any tachy      palpitations.  3. Hypertension, stable.  Continue current regimen.  4. Hyperlipidemia.  He is on Vytorin therapy.  5. Cellulitis of the left foot.  I have given her a prescription for      Keflex 250 mg 1 p.o. q.i.d. for 10 days, #40 with no refills. I      note that his white count was up during his hospitalization and he      now said  that he has had some degree of this redness since his      hospitalization.  I suspect  that the two are related.  We will      obtain a repeat CBC today.  6. Chronic renal insufficiency, obtaining a BMET today.  Consider      reinitiation of angiotensin-receptor blocker at some point as an      outpatient.  7. Type 2 diabetes mellitus followed by primary care.  8. ? sleep apnea.  The patient reports what sounds like paroxysmal      nocturnal dyspnea with an associated sensation or feeling of panic.      He is a large gentleman who has never been evaluated for sleep      apnea before.  We have made a referral to the [facility]      Clinic.  9. Obesity.  The patient is encouraged to increase activity as      tolerated and also to limit calories.   DISPOSITION:  The patient will follow up with Dr. [name] in  approximately 3 months or sooner if necessary.      Nicolasa Ducking, ANP  Electronically Signed      Madolyn Frieze. Jens Som, MD, [facility]  Electronically Signed   CB/MedQ  DD: 06/10/2007  DT: 06/10/2007  Job #: [id number]

## 2011-05-08 NOTE — Discharge Summary (Signed)
[name], [name] NO.:  [id number]   MEDICAL RECORD NO.:  [id number]          PATIENT TYPE:  INP   LOCATION:  4708                         FACILITY:  [facility]   PHYSICIAN:  Veverly Fells. Excell Seltzer, MD  DATE OF BIRTH:  1941/12/22   DATE OF ADMISSION:  01/13/2009  DATE OF DISCHARGE:  01/20/2009                               DISCHARGE SUMMARY   DISCHARGE DIAGNOSES:  1. Acute-on-chronic diastolic heart failure.  2. Chronic kidney disease stage III.  3. Coronary artery disease status post multiple percutaneous coronary      interventions in the past.  4. Diabetes type 2.   PAST MEDICAL HISTORY:  1. Hyperlipidemia.  2. Hypertension.  3. Pulmonary emboli on chronic Coumadin therapy.  4. GERD.   HOSPITAL COURSE:  [name] is a 70 year old gentleman with past  medical history as stated above who over the last several months has  developed increased swelling and edema with distention into his abdomen,  swelling in his legs with increased dyspnea.  The patient's weight had  increased to 270 pounds over the last few months as compared to 251  pounds in July 2009.  Dr. [name] evaluated the patient and felt he had  at least 25 pounds of fluid on him.  The patient was admitted for  aggressive IV diuresis with plans for aquapheresis if needed.  The  patient responded well to IV diuresis, and on the day of discharge,  weight was down to 114.6 kg, it was 122.6 kg on admission.  At the time  of discharge, INR 2.5, potassium 3.5.  Note, potassium has been  increased, creatinine 1.9 which is stable for the patient.  The patient  is to continue his previous medications with the exception of the  following changes.  1. His aspirin has been decreased to 81 mg daily.  2. His Lasix has been changed to 160 mg b.i.d.  3. Metolazone 2.5 mg has been added on Monday, Wednesdays, and      Fridays.  4. His potassium has been increased to 40 mEq in the a.m. and 20 mEq      in the p.m.   His other medications as previously prescribed include:  1. Omeprazole 20 daily.  2. Cartia XT 300 mg daily.  3. Zyrtec 10.  4. Vytorin 10/80.  5. Trazodone 100.  6. Iron tablets 325 t.i.d.  7. Calcitriol 25 daily.  8. Hydrocodone p.r.n.  9. Folic tablet daily.  10.Symbicort 2 puffs daily.  11.Insulin Humalog and Humulin as previously prescribed.  12.Aggrenox.  13.Nitroglycerin p.r.n.  14.Bisoprolol 5 mg daily.  15.Warfarin 5 mg daily.  16.Isosorbide mononitrate 30 mg daily.  17.Reglan 10 mg q.i.d.  18.Allopurinol 100 mg daily.  19.Spiriva inhaler daily.  20.Flomax 0.4 mg daily.   These medications are as per the medication reconciliation form.  The  patient has been participant in the free drug loop diuretic in heart  failure research study.  He will receive a month supply of Lasix free  through the [facility].  He has prescriptions for the  potassium, Lasix, and metolazone.  He also has a followup appointment at  [facility] on January 31, 2009, at 11:30 at which time a BMET and  BNP will be drawn and then follow up with Dr. Regino Schultze PA on February 02, 2009, at 8 a.m.   DURATION OF DISCHARGE ENCOUNTER:  Well over 30 minutes.      Dorian Pod, ACNP      Veverly Fells. Excell Seltzer, MD  Electronically Signed    MB/MEDQ  D:  01/20/2009  T:  01/20/2009  Job:  [id number]

## 2011-05-08 NOTE — H&P (Signed)
NAMEZYEN, TRIGGS NO.:  [id number]   MEDICAL RECORD NO.:  [id number]          PATIENT TYPE:  INP   LOCATION:  2041                         FACILITY:  [facility]   PHYSICIAN:  Unice Cobble, MD     DATE OF BIRTH:  [birth year]   DATE OF ADMISSION:  02/06/2009  DATE OF DISCHARGE:                              HISTORY & PHYSICAL   CARDIOLOGIST:  Everardo Beals. [name], MD, [facility]   CHIEF COMPLAINT:  Chest pain.   HISTORY OF PRESENT ILLNESS:  This is a 70 year old white male with a  history of coronary disease, status post PCI presents with chest pain.  The chest pain is burning across his chest and goes down his left arm.  It is associated with shortness of breath, burning up, and some  nausea.  He has had no diaphoresis, presyncope, syncope or vomiting.  He  has had increased edema, PND, and orthopnea since his medications were  decreased on the 10th.   PAST MEDICAL HISTORY:  1. Diastolic congestive heart failure.  2. Chronic kidney disease with a baseline creatinine of 2.6.  3. Coronary artery disease status post multiple PCIs.  4. History of PE on Coumadin chronically.  5. Hypertension.  6. Hyperlipidemia.  7. Insulin-dependent diabetes mellitus.  8. Normal LV function.  9. GERD.   ALLERGIES:  SHELLFISH, ACE INHIBITORS, CONTRAST INDUCED NEPHROPATHY.   MEDICATIONS:  1. Lasix 160 mg b.i.d.  2. Metolazone 2.5 mg twice a week.  3. Omeprazole 20 mg daily.  4. Cardia XT 300 mg daily.  5. Zyrtec 10 mg daily.  6. Vytorin 10/80 mg daily.  7. Trazodone 100 mg q.h.s.  8. Iron sulfate 325 mg t.i.d.  9. Calcitriol 25 mg daily.  10.Hydrocodone p.r.n.  11.Folic acid  12.Symbicort 2 puffs daily.  13.Insulin Humulin N 60 units before.  14.Humalog 60 before breakfast, 15 before lunch and 60 before dinner.  15.Aggrenox.  16.Nitroglycerin p.r.n.  17.Bisoprolol 5 mg daily.  18.Warfarin 5 mg daily alternating with 2.5 mg daily.  19.Imdur 30 mg daily.  20.Reglan 10 mg  q.i.d.  21.Allopurinol 100 mg daily.  22.Spiriva.  23.Flomax 0.4 mg daily.   SOCIAL HISTORY:  Lives in [place] with his wife and son.  He is  retired from [employer].  A 60 pack year tobacco history.  No alcohol or drugs.  He quit tobacco 9-10 years ago.   FAMILY HISTORY:  His mother died of lung cancer at the age of 26.  His  father died of COPD at the age of 39.   REVIEW OF SYSTEMS:  A complete review of systems was done found to be  otherwise negative except as stated in the HPI.   PHYSICAL EXAM:  Temperature is 98.1, pulse 76, respiratory rate is 18,  blood pressure 113/44, O2 saturations 97% on 2 liters.  GENERAL:  He is obese in no acute distress.  HEENT: Shows NCAT, PERRLA, EOMI, MMM.  Oropharynx without erythema or  exudates.  NECK: Supple without lymphadenopathy, thyromegaly, bruits or jugular  venous distention.  HEART:  Has a regular rate and rhythm with a  normal S1-S2 without  murmurs, gallops or rubs.  LUNGS: Clear to auscultation bilaterally, without wheezes, rhonchi or  rales.  ABDOMEN:  Soft and nontender with normal bowel sounds.  He is a large  ventral hernia in a very rounded belly.  EXTREMITIES:  Show no cyanosis  or clubbing.  He has trace edema on the left and mild 1+ nonpitting  edema on the right.  NEUROLOGICALLY:  He is alert and oriented x3 with  cranial nerves II-XII grossly intact and strength 5/5 all extremities.   Radiology:  Chest x-ray shows cardiomegaly with right lung base scarring  or atelectasis.   EKG shows a rate of 75 with normal sinus rhythm with nonspecific T-wave  changes.   His labs show a white count of 11.1 with a hemoglobin of 8.  He has a  neutrophil percentage of 79%.  His sodium is down to 129 and his  potassium is down at 3.3.  His BUN is up at 79 and creatinine is 2.1.  His blood sugar is 392.  INR is 5.3.  BNP is 150.  CK-MB and troponin  are negative.   ASSESSMENT/PLAN:  This is a 70 year old white male with a history of   coronary disease status post PCI with diastolic heart failure presents  with chest pain.  The patient has had similar chest pain in the past  prior to percutaneous coronary intervention and it was relieved with  nitroglycerin in the emergency department tonight.  I will admit him for  rule out myocardial infarction with serial troponins and EKGs.  His  congestive heart failure is stable from physical exam and BNP despite  symptoms.  I will continue diuresis using IV Lasix instead of p.o.  Lasix.  I will hold Coumadin for his elevated INR.  His potassium will need to  be repleted.  His hemoglobin is low and this will be rechecked in the  morning.  He will be placed on insulin sliding scale in addition to  baseline insulin for his diabetes.      Unice Cobble, MD  Electronically Signed     ACJ/MEDQ  D:  02/06/2009  T:  02/06/2009  Job:  [id number]

## 2011-05-08 NOTE — Consult Note (Signed)
NAME:  [name], [name]               ACCOUNT NO.:  [id number]   MEDICAL RECORD NO.:  [id number]          PATIENT TYPE:  OBV   LOCATION:  4703                         FACILITY:  [facility]   PHYSICIAN:  Everardo Beals. [name], MD, FACCDATE OF BIRTH:  07-25-1941   DATE OF CONSULTATION:  03/02/2008  DATE OF DISCHARGE:  09/13/2007                                 CONSULTATION   PRIMARY CARE PHYSICIAN:  Sean A. Everardo All, M.D.   CLINICAL HISTORY:  Mr. Mance is 70 years old and returns for follow-  up management of his coronary heart disease and diastolic heart failure.  He says he has been doing fairly well over the last few months, in terms  of his fluid, and feels like the fluid is under pretty good control.  He  is on high dose Lasix and Zaroxylan for this.  He has had no angina.  He  does get short of breath with exertion, but this has not changed.   He has coronary artery disease and has had multiple percutaneous  coronary interventions but had nonobstructive disease at the last  catheterization.   PAST MEDICAL HISTORY:  1. Hypertension.  2. Hyperlipidemia.  3. Insulin-dependent diabetes.  4. Chronic renal insufficiency.  5. GERD.  6. He has a history of pulmonary embolus, on Coumadin therapy.   CURRENT MEDICATIONS:  1. Insulin.  2. Metoclopramide.  3. Prilosec.  4. Taztia 300 mg daily.  5. Bisoprolol 5 mg daily.  6. Uroxatral.  7. Iron.  8. Aggrenox.  9. Isosorbide 40 mg daily.  10.Aspirin.  11.Zyrtec.  12.Coumadin.  13.Vytorin 10/80.  14.Allopurinol.  15.K-Dur 20 mEq 2 b.i.d.  16.Lasix 80 mg 3 b.i.d.  17.Trazodone.  18.Metolazone 2.5 mg weekly.   PHYSICAL EXAMINATION:  Blood pressure was 130/76, pulse 64 and regular.  There was no venous distention.  Carotid pulses were full.  I could hear  no bruits.  CHEST:  Clear.  HEART:  Rhythm was regular.  Heart sounds were normal without any  murmurs or gallops.  ABDOMEN:  Quite protuberant.  I had difficulty feeling the liver  or  spleen.  There were normal bowel sounds.  The abdomen was soft.  There was 1+ peripheral edema.  Pedal pulses were difficult to feel.   IMPRESSION:  1. Diastolic heart failure, now fairly well compensated.  2. Coronary artery disease, status post multiple percutaneous coronary      interventions with nonobstructive disease at last catheterization.  3. Good left ventricular systolic function.  4. Insulin-dependent diabetes.  5. Hypertension.  6. Hyperlipidemia.  7. History of pulmonary embolism.  8. Chronic renal insufficiency.  9. Gastroesophageal reflux disease.   RECOMMENDATIONS:  I think Mr. Mauger is doing reasonably well.  He  just has minimal volume overload, which is probably as good as we can  do. Will get a BNP, CBC, and a BMP on him today.  I will see him back in  four months.  We will get his lipid profile, which he had recently done  by [name].      Bruce Elvera Lennox [name], MD, [facility]  Electronically  Signed     BRB/MEDQ  D:  03/02/2008  T:  03/02/2008  Job:  [id number]

## 2011-05-08 NOTE — H&P (Signed)
[name], KAHRS NO.:  [id number]   MEDICAL RECORD NO.:  [id number]          PATIENT TYPE:  OBV   LOCATION:  4703                         FACILITY:  [facility]   PHYSICIAN:  Gerrit Friends. Dietrich Pates, MD, FACCDATE OF BIRTH:  [birth year]   DATE OF ADMISSION:  09/12/2007  DATE OF DISCHARGE:  09/13/2007                              HISTORY & PHYSICAL   PRIMARY CARDIOLOGIST:  Everardo Beals. [name], MD, [facility]   PRIMARY CARE PHYSICIAN:  Sean A. Everardo All, MD   PATIENT PROFILE:  A 70 year old married Caucasian male with a prior  history of CAD, PAF, and diastolic heart failure who presents with acute  on chronic renal insufficiency/failure, and prerenal azotemia.   PROBLEM LIST:  1. Acute on chronic kidney disease and prerenal azotemia:  A)  History      of contrast-induced nephropathy May 2008.  2. Coronary artery disease:  A)  Status post PCI to the LAD and RCA in      1980 and then in 1990.  B)  Status post rotational arthrectomy and      placement of two Taxus drug-eluting stents to the right coronary      artery in April 2004.  3. Cardiac catheterization in February 2007 and on May 23, 2007,      revealing nonobstructive coronary artery disease with patent stents      in the RCA.  4. Paroxysmal atrial fibrillation diagnosed in May 2008 on Coumadin.  5. Hypertension.  6. Hyperlipidemia.  7. Chronic obstructive pulmonary disease.  8. History of previous cerebrovascular accident.  9. Remote history of pulmonary embolism.  10.Type 2 diabetes mellitus.  11.Shellfish allergy.  12.Gastroesophageal reflux disease.  13.Obesity.  14.Chronic diastolic congestive heart failure:  A)  On May 27, 2007, a      2-D echocardiogram - EF of 55-60%, diastolic dysfunction,      moderately dilated left atrium.   HISTORY OF PRESENT ILLNESS:  A 70 year old married Caucasian male with a  history of CAD, PAF, chronic renal insufficiency and diastolic CHF.  She  was seen by Dr. [name] September 03, 2007, with complaints of increasing  lower extremity edema and question of right lower extremity cellulitis.  He was started on Zaroxolyn 2.5 mg daily.  Since then, he has had a 10  pound weight loss with significant improvement in the lower extremity  edema and abdominal girth.  He started having a headache earlier this  week but otherwise has felt well without chest pain, shortness of  breath, weakness, or fatigue.  He has had some constipation over the  past couple of days.   Today, he went for follow up blood work and was found to have an  elevated creatinine of 3.2 with decreased potassium and sodium.  He was  advised to present to the ED for management.  Here he is asymptomatic.   ALLERGIES:  SHELLFISH.  ACE INHIBITOR CAUSES COUGH.  He has a history of  CONTRAST-INDUCED NEPHROPATHY in May 2008, following catheterization.   HOME MEDICATIONS:  1. Humalog 35 units a.m., 15 units lunch, 40 units dinner, 10  units      p.r.n. with bedtime snack.  2. Humulin N 60 units nightly.  3. Aggrenox 25/200 mg b.i.d.  4. Aspirin 325 mg daily.  5. Lasix 240 mg b.i.d.  6. Bisoprolol 5 mg daily.  7. Coumadin 5 mg nightly.  8. Imdur 30 mg daily.  9. Reglan 10 mg q.i.d. a.c. and nightly.  10.Allopurinol 100 mg daily.  11.Rozerem 8 mg nightly.  12.Omeprazole 20 mg daily.  13.Diltiazem XT 300 mg daily.  14.Zyrtec 10 mg daily.  15.Vytorin 10/80 mg q.p.m.  16.Trazodone 100 mg nightly.  17.Ferrous sulfate 325 mg t.i.d.  18.Calcitriol 25 mg daily.  19.Hydrocodone/APAP 5 mg/500 mg q.i.d.  20.Folic acid one tablet daily.  21.Uroxatral 10 mg daily.   FAMILY HISTORY:  Mother died of lung cancer at age 24.  Father died of  COPD at age 98.  He has two brothers and one sister, all are alive and  well.   SOCIAL HISTORY:  Lives in [place] with his wife.  He retired from  [employer].  He has a 60-pack-year-hixtory of tobacco abuse, quitting about 7  or 8 years ago.  He previously used to drink alcohol but  has had none  for the past seven years.  He denies any drug use.  He works out in a  gym three times per week using cardio equipment, as well as weight  training and balance training.   REVIEW OF SYMPTOMS:  Positive for headache with recent history of right  lower extremity cellulitis and edema which has since resolved.  Otherwise, all systems reviewed are negative.   PHYSICAL EXAMINATION:  VITAL SIGNS:  Temperature 97.0, heart rate 60,  respirations 12, blood pressure 129/61.  Pulse oximetry 94% on room air.  GENERAL:  A pleasant white male in no acute distress, awake, alert and  oriented x3.  NECK:  No bruits or JVD.  LUNGS:  Respirations regular and unlabored, clear to auscultation.  CARDIAC:  Regular S1, S2.  No S3, S4 or murmurs.  ABDOMEN:  Obese, soft, nontender and nondistended.  Bowel sounds are  present x4.  EXTREMITIES:  Warm, dry and pink.  No clubbing, cyanosis or edema.  Dorsalis pedis and posterior tibial pulses 2+ and equal bilaterally.  HEENT:  Normal.  NEUROLOGICAL:  Grossly intact and nonfocal.   EKG:  Pending.   LABORATORY DATA:  Hemoglobin 13.5, hematocrit 40.1, WBC 11.9, platelets  299.  Sodium 122, potassium 2.5, chloride 79, CO2 41, BUN 110,  creatinine 3.2, glucose 318.   ASSESSMENT/PLAN:  1. Acute on chronic renal failure in the setting of probable prerenal      azotemia and over-diuresis.  We will plan to observe overnight and      hydrate with one liter of normal saline.  We will hold the      diuretics and supplement his potassium.  Follow albumin in the a.m.  2. Hypokalemia/hyponatremia.  Hydrate and supplement potassium as      above.  3. Coronary artery disease, stable.  Continue home medications.  4. Hypertension, stable.  Continue home medications.  5. Hyperlipidemia.  Continue statin therapy.  6. Type 2 diabetes mellitus.  Continue home insulin regimen and add      sliding-scale insulin.  7. Paroxysmal atrial fibrillation.  Continue beta  blocker, calcium      channel blocker and Coumadin.  Follow up EKG.  8. Gastroesophageal reflux disease, stable.  Continue proton pump      inhibitor.  9. Recent history  of right lower extremity cellulitis.  This has      resolved and he has finished a course of Keflex.  10.Chronic diastolic congestive heart failure.  He is euvolemic on      exam.  We will be holding his diuretics.  We will have to watch him      closely as he will receive hydration.      Nicolasa Ducking, ANP      Gerrit Friends. Dietrich Pates, MD, [facility]  Electronically Signed    CB/MEDQ  D:  09/12/2007  T:  09/13/2007  Job:  [id number]

## 2011-05-08 NOTE — Assessment & Plan Note (Signed)
[facility]                            CARDIOLOGY OFFICE NOTE   [name], [name]                        MRN:          [id number]  DATE:11/25/2007                            DOB:          1941/08/23    PRIMARY CARE PHYSICIAN:  Sean A. Everardo All, M.D.   CLINICAL HISTORY:  [name] is 70 years old and returns for  management of his congestive heart failure and coronary heart disease.  He has been having difficulty with diastolic heart failure over the last  number of weeks.  He saw Wende Bushy on November 12, 2007, and she  felt he was somewhat volume overloaded and started him on Zaroxolyn 2.5  mg once a week.  She continued three Lasix twice a day with supplemental  potassium.   He thinks he has done fairly well.  He says he feels like his weight has  been fairly stable, and his edema has been stable.  He has had  occasional chest tightness and occasional anxiety attacks, but no  exertional angina.   PAST MEDICAL HISTORY:  Significant for the problems outlined below.   CURRENT MEDICATIONS:  1. Insulin.  2. Metoclopramide.  3. Prilosec.  4. Taztia.  5. Bisoprolol.  6. Uroxatral.  7. Aggrenox.  8. Isosorbide.  9. Aspirin.  10.Zyrtec.  11.Coumadin.  12.Vytorin.  13.Allopurinol.  14.K-Dur.  15.Lasix.  16.Trazodone.  17.Metolazone 2.5 weekly.   PHYSICAL EXAMINATION:  VITAL SIGNS:  Blood pressure is 130/64, pulse 62  and regular.  NECK:  There was no venous distention.  Carotid pulses were full.  CHEST:  Clear without rales or rhonchi.  CARDIAC:  Rhythm was regular.  No murmurs, rubs, or gallops.  ABDOMEN:  Protuberant.  I cannot feel any hepatosplenomegaly.  The bowel  sounds were normal.  EXTREMITIES:  There was 1+ edema in the lower extremities, and palpable  pulses were difficult to feel.   IMPRESSION:  1. Diastolic heart failure with mild volume overload.  2. Coronary artery disease status post multiple percutaneous  coronary      interventions with non-obstructive disease at last catheterization.  3. Good left ventricular systolic function.  4. Insulin-dependent diabetes.  5. Hypertension.  6. Hyperlipidemia.  7. History of pulmonary embolism on Coumadin therapy.  8. Chronic renal insufficiency.  9. Gastroesophageal reflux disease.   RECOMMENDATIONS:  I think [name] is doing reasonably well.  It is  a delicate balance between pushing his diuretic too far and volume  overload, but I think we should probably settle for our current state of  affairs.  Will plan to get a BMP, BNP and CBC next week and continue him  on his current dose of diuretics.  I will plan to see him back in 3  months.     Bruce Elvera Lennox [name], MD, [facility]  Electronically Signed    BRB/MedQ  DD: 11/25/2007  DT: 11/26/2007  Job #: [id number]   cc:   Gregary Signs A. Everardo All, MD

## 2011-05-08 NOTE — H&P (Signed)
NAMEADDIS, TUOHY NO.:  [id number]   MEDICAL RECORD NO.:  [id number]          PATIENT TYPE:  EMS   LOCATION:  MAJO                         FACILITY:  [facility]   PHYSICIAN:  Bevelyn Buckles. Bensimhon, MDDATE OF BIRTH:  [birth year]   DATE OF ADMISSION:  05/22/2007  DATE OF DISCHARGE:                              HISTORY & PHYSICAL   ADDENDUM   MEDICATIONS:  Apparently there has been some confusion about his  medications.  Although he does not have current medications available,  what we have, he is on Lasix - unknown dose, iron, insulin, Trazodone,  Vytorin, Prilosec, metoprolol 50 mg in the morning and 25 mg at night,  Aggrenox, UroXatral, hydrocodone, and Cozaar.   ALLERGIES:  HE IS ALLERGIC TO ACE INHIBITORS DUE TO A COUGH.      Bevelyn Buckles. Bensimhon, MD  Electronically Signed     DRB/MEDQ  D:  05/22/2007  T:  05/22/2007  Job:  [id number]

## 2011-05-08 NOTE — Assessment & Plan Note (Signed)
[facility]                            CARDIOLOGY OFFICE NOTE   [name], [name]                        MRN:          [id number]  DATE:02/02/2009                            DOB:          December 16, 1941     The patient seen at [facility] at February 02, 2009 for Dr.  Jens Som.   PRIMARY CARDIOLOGIST:  Everardo Beals. [name], MD, [facility]   PRIMARY CARE PHYSICIAN:  Sean A. Everardo All, MD   HISTORY OF PRESENT ILLNESS:  This is a 70 year old married white male  patient of Dr. Smitty Cords Brodie's who was just hospitalized with acute on  chronic diastolic heart failure.  He was up about 25 pounds.  He also  has chronic kidney disease stage III.  Creatinine was 1.9 when he went  in the hospital.  The patient diuresed.  Creatinine did go up to 2.5.  Today, his weight is down to 252, down from 270.  He did have blood work  on Monday.  His potassium was 3.1, which was replaced.  Creatinine was  2.6.  He says his sugars have been running high, it was 423 on Monday  and today, he says it was 240 this morning.  He says he feels dried out.  Currently, he is taking Lasix 160 mg b.i.d. and metolazone 2.5 mg t.i.d.  Prior to hospitalization, he was on Lasix 240 mg b.i.d.   The patient can walk very little without getting short of breath, but  his edema is much improved and he denies any orthopnea or paroxysmal  nocturnal dyspnea.   PHYSICAL EXAM:  GENERAL:  This is an overweight 70 year old white male  in no acute distress.  VITAL SIGNS:  Blood pressure 111/64, pulse 78, weight 252.  NECK:  Without JVD, HJR, bruit, or thyroid enlargement.  LUNGS:  Clear anterior, posterior, and lateral.  HEART:  Regular rate and rhythm at 78 beats per minute.  Normal S1 and  S2.  Distant heart sounds.  No murmur, rub, bruit, thrill, or heave  noted.  ABDOMEN:  Obese.  Normoactive bowel sounds are heard throughout.  No  organomegaly, masses, lesions, or abnormal tenderness noted.  EXTREMITIES:  Edema +1 on the right, mild edema on the left.  Positive  distal pulses.   IMPRESSION:  1. Acute on chronic diastolic heart failure, resolved, now on the dry      side.  2. Chronic kidney disease stage III, creatinine up to 2.6.  3. Coronary artery disease, status post multiple percutaneous coronary      interventions in the past.  Currently stable.  4. History of pulmonary embolus.  5. Chronic Coumadin therapy.  6. Hypertension.  7. Hyperlipidemia.  8. Insulin-dependent diabetes mellitus.  9. Good left ventricular function.  10.Nonobstructive coronary artery disease at last catheterization.  11.Gastroesophageal reflux disease.   PLAN:  I will check a BMET today and repeat it again in 1 week.  I am  decreasing his metolazone to 2.5 mg twice a week and we may be able to  decrease it further.  He will see me back in 2  weeks and Dr. [name] back  in 1 month.  I asked him to call if he has any further problems with  weight gain, edema, or excessive dryness and we can adjust his  medications or see him sooner.      [name], PA-C  Electronically Signed      Madolyn Frieze. Jens Som, MD, [facility]  Electronically Signed   ML/MedQ  DD: 02/02/2009  DT: 02/02/2009  Job #: [id number]

## 2011-05-08 NOTE — Assessment & Plan Note (Signed)
[facility]                            CARDIOLOGY OFFICE NOTE   [name], [name]                        MRN:          [id number]  DATE:10/13/2007                            DOB:          Sep 21, 1941    PRIMARY CARE PHYSICIAN:  Dr. Romero Belling.   PAST MEDICAL HISTORY:  Mr. Console returns for followup management of  his congestive heart failure.  He is 70 years old.  I had seen him back  on July 10, and at that time, we thought he was about 10 pounds volume  overloaded.  We started him on Zaroxolyn, but he became dehydrated and  required hospitalization.  The Zaroxolyn was discontinued and his Lasix  was cut back to half.  He was seen in followup by Dr. Dietrich Pates, who  reinstituted Lasix at 240 mg in the morning, but not b.i.d., and then  she saw [name], PA-C, on October 8, and she reinstituted the  Lasix at 240 twice a day.  Still, not resuming Zaroxolyn.  Since that  time, he has lost about 4 pounds.  His weight today was 242 pounds on  our scales and 236 on his home scale.  When I saw him and I thought he  was 10 pounds volume overloaded, his weight on our scale was 245.   He has had no chest pain or palpitations.   PAST MEDICAL HISTORY:  Significant for multiple problems listed below.   CURRENT MEDICATIONS:  Insulin.  Reglan.  Prilosec.  Castiva.  Bisoprolol.  Uroxatral.  Aggrenox.  Isosorbide.  Aspirin.  Zyrtec.  Coumadin.  Vytorin.  Allopurinol.  Trazodone.  K-Dur.  Lasix.   EXAMINATION:  Blood pressure 115/64, pulse 64 and regular.  Weight 242  pounds.  There was no definite venous distension.  The carotid pulses are full.  CHEST:  Clear.  There are no rales or rhonchi.  The cardiac rhythm was regular.  I could hear no murmurs or gallops.  ABDOMEN:  Soft.  Quite protuberant, but soft.  There was no  hepatosplenomegaly.  There was 2+ edema in the lower extremities.   An electrocardiogram was normal.   IMPRESSION:  1.  Congestive heart failure related to diastolic dysfunction with mild      volume overload.  2. Coronary artery disease status post multiple PCI with      nonobstructive disease at last catheterization.  3. Good left ventricular systolic function.  4. Insulin dependent diabetes.  5. Hypertension.  6. Hyperlipidemia.  7. History of pulmonary embolism on Coumadin therapy.  8. Chronic renal insufficiency.  9. Gastroesophageal reflux disease.   RECOMMENDATIONS:  Mr. Lauderback appears to be doing better.  He still has  some mild volume overload and probably could afford to lose 3 or 4  pounds.  He has lost some weight on his current regimen, so we will  leave him on 80 mg 3 tablets b.i.d. of Lasix.  We will get a BNP today  and see if we need to adjust his potassium or if his renal function is  of concern.  I  will see him back in 3 weeks.  Marcelino Duster talked to him  last week and I reemphasized to him the importance of watching salt in  his diet.     Bruce Elvera Lennox [name], MD, [facility]  Electronically Signed    BRB/MedQ  DD: 10/13/2007  DT: 10/14/2007  Job #: [id number]

## 2011-05-08 NOTE — Cardiovascular Report (Signed)
NAMEKOL, CONSUEGRA NO.:  [id number]   MEDICAL RECORD NO.:  [id number]          PATIENT TYPE:  INP   LOCATION:  2906                         FACILITY:  [facility]   PHYSICIAN:  Everardo Beals. [name], MD, FACCDATE OF BIRTH:  [birth year]   DATE OF PROCEDURE:  05/23/2007  DATE OF DISCHARGE:                            CARDIAC CATHETERIZATION   CLINICAL HISTORY:  [name] is 70 years old and is well known to me.  He has had multiple percutaneous interventions including tandem  overlapping Taxus stent in the proximal and mid right coronary artery.  He was last studied a little less than a year ago at which time he had  nonobstructive disease.  He also has in-stent, diabetes, diastolic heart  failure and peripheral vascular disease.  He also has had a previous  stroke and is on Aggrenox.  He was admitted yesterday evening with an  episode of chest pain which was prolonged and which was temporarily  relieved with nitroglycerin.  Pain was slightly different than his  previous ischemic pain.  He was admitted by Dr. Gala Romney and felt to  have unstable angina and scheduled for evaluation with angiography.  After admission, he developed an episode of paroxysmal atrial  fibrillation and was in atrial flutter when he came to the lab today,  with a rate of about 120.   PROCEDURE:  The procedure was performed via the right femoral artery  using an arterial sheath and 5-French preformed coronary catheters.  We  were unable to obtain access via the right femoral artery, even with the  help of a Doppler needle.  We were able obtain access via the left  femoral artery with the help of a Doppler needle.  __________  was  performed at the end of the procedure to evaluate the patient's  peripheral vascular disease.  He tolerated the procedure well and left  the laboratory in satisfactory condition.   RESULTS:  The aortic pressure is 126/81 with mean of 103 and left  ventricle pressure is  126/24.   Left main coronary artery and __________ coronary artery was free of  significant disease.   Left anterior descending artery and left ascending artery had diffuse  irregularities.  They gave rise to three septal perforators and two  diagonal branches.  The diagonal branch was small in caliber and  diffusely irregular and there was 70% narrowing at the ostium of the  first diagonal branch.   The circumflex artery and __________  gave rise to a large ramus branch  and atrial branch, a marginal branch and a small posterolateral branch.  These vessels were also somewhat small and irregular.  There is a 40%  narrowing in the ramus branch.   The right __________  was a moderately large vessel and gave rise to 2  right ventricle branches and posterior descending branch and three  posterolateral branches.  There was less than 10% stenosis at the  overlapping stents in the proximal to mid right coronary artery.  There  was 40% narrowing in the distal right coronary.  There was 70% narrowing  in  the AV portion of the right coronary before the last two  posterolateral branches after the posterior descending branch.  There  was 70% narrowing in the midportion of posterior descending branch.   No left ventriculogram was performed because of the patient's renal  insufficiency (creatinine equals 1.9)   __________ was performed which showed patent iliofemoral vessels.  The  right side could be seen down below the common femoral artery.   CONCLUSION:  1. Nonobstructive coronary artery disease was 70% narrowing in the      first diagonal branch of the LAD, 40% narrowing in the ramus branch      of circumflex artery, less than 10% stenosis at the stent sites in      the proximal mid right coronary with 70% stenosis in the distal AV      right coronary artery and 70% stenosis in the posterior descending      branch of the right coronary artery.  2. New-onset atrial fibrillation/atrial  flutter.   RECOMMENDATIONS:  We will plan medical treatment for the patient's  coronary disease.  I will plan initial rate control with IV Cardizem and  IV heparin for his atrial fibrillation and will start Coumadin.  If he  does not convert, then will consider DC cardioversion.  If he converts  and recurs then we will consider rhythm control medications, probably  amiodarone.      Bruce Elvera Lennox [name], MD, [facility]  Electronically Signed     BRB/MEDQ  D:  05/23/2007  T:  05/23/2007  Job:  [id number]   cc:   Gregary Signs A. Everardo All, MD

## 2011-05-08 NOTE — Discharge Summary (Signed)
[name], HUY NO.:  [id number]   MEDICAL RECORD NO.:  [id number]          PATIENT TYPE:  OBV   LOCATION:  4703                         FACILITY:  [facility]   PHYSICIAN:  Gerrit Friends. Dietrich Pates, MD, FACCDATE OF BIRTH:  08-Feb-1941   DATE OF ADMISSION:  09/12/2007  DATE OF DISCHARGE:                               DISCHARGE SUMMARY   CARDIOLOGIST:  Everardo Beals. [name], MD, [facility]   PRIMARY CARE PHYSICIAN:  [name].   REASON FOR ADMISSION:  Dehydration and electrolyte abnormality.   DISCHARGE DIAGNOSES:  1. Acute on-chronic renal insufficiency, secondary to over-diuresis.      a.     Improved at discharge.  2. Coronary artery disease status post multiple PCIs in the past, with      non-obstructive disease at last catheterization.  3. Chronic diastolic congestive heart failure.      a.     Recent acute on-chronic episode.  4. Recent cellulitis of the right lower extremity.  5. Good left ventricular function.  6. Insulin-dependent diabetes mellitus.  7. Hypertension.  8. Hyperlipidemia.  9. Past history of pulmonary embolism.  10.Gastroesophageal reflux disease.  11.Paroxysmal atrial fibrillation.      a.     Diagnosed May of 2008.  12.Chronic Coumadin therapy.  13.Shellfish allergy.   ADMISSION HISTORY:  [name] is a 70 year old male patient followed  by Dr. [name], who recently saw Dr. [name] in the office on September  10th.  The patient had increasing pedal edema, and there was some  concern for right lower extremity cellulitis, as well as acute on-  chronic diastolic heart failure.  He was already on high-dose Lasix and  has a history of renal insufficiency.  He was placed on metolazone for  diuresis.  The patient had lost 10 pounds, and his edema and abdominal  girth had both decreased.  The patient felt well and returned for follow-  up blood work.  His creatinine was elevated at 3.2.  He was also noted  to have hypokalemia, as well as hyponatremia,  and he was referred to the  emergency room.   HOSPITAL COURSE:  Upon admission, the patient was noted to have a sodium  of 122, potassium 2.5, BUN 110, creatinine 3.2.  His urinalysis was  fairly unremarkable.  He was admitted for IV hydration and potassium  supplementation.  His diuretics were held on the morning of September 13, 2007.  His sodium had improved to 132, potassium 3.2, BUN 98 and  creatinine 2.84.  It was felt that the patient was stable enough for  discharge to home.  He would be given 2 more doses of potassium  supplementation, prior to discharge.  His diuretics should be held over  the weekend.  Dr. Dietrich Pates, who saw the patient, requested that he been  seen in clinic, either by the physician assistant/nurse practitioner or  the CHF clinic Monday, September 22 (two days from now), for followup,  as well as to check a basic metabolic panel.   LABS AND ANCILLARY DATA:  White count 11,900, hemoglobin 15, hematocrit  44,  platelet count 299,000, INR 2.5, at discharge sodium 132, potassium  3.2, chloride  88, CO2 35, glucose 255, BUN 98, creatinine 2.84.  LFTs  okay, total protein 5.8, albumin 3.1, urinalysis normal.   DISCHARGE MEDICATIONS:  1. Aggrenox 25/200 mg b.i.d.  2. Aspirin 325 mg daily.  3. Calcitriol 0.25 mcg daily.  4. Folic acid 1 mg daily.  5. Humalog insulin 35 units in the morning, 15 units at lunch, 40      units at dinner and 10 units at bedtime.  6. Insulin NPH 60 units at bedtime.  7. Prilosec 20 mg daily.  8. Metoclopramide 10 mg four times a day.  9. Trazodone 100 mg q.h.s.  10.Uroxatral 10 mg daily.  11.Vytorin 10/80 mg q.h.s.  12.Zyrtec 10 mg daily.  13.Combivent inhaler p.r.n.  14.Allopurinol 100 mg daily.  15.Warfarin as directed.  16.Tiazac 300 mg daily.  17.Bisoprolol 5 mg daily.  18.Isosorbide mononitrate 30 mg daily.  19.Nitroglycerin p.r.n. chest pain.  20.Iron tablet 325 mg three times a day.  21.Hydrocodone APAP p.r.n.   22.Rozerem 8 mg q.h.s.   The patient has been advised to stop his Lasix, as well as his Zaroxolyn  for now.  He will receive further instructions, when he is seen back in  the office September 15, 2007.   DIET:  Low fat, low sodium diabetic diet.   WOUND CARE:  Not applicable.   FOLLOWUP:  He will be contacted on Monday morning, September 22, to  arrange an appointment for that same day, with either the PA/NP or heart  failure doctor, to reassess his diastolic heart failure, as well as his  dehydration and make decisions on re-starting his diuretic.  A BMET will  be drawn that day.   Total physician PA time greater than 30 minutes on this discharge.      Tereso Newcomer, PA-C      Gerrit Friends. Dietrich Pates, MD, [facility]  Electronically Signed    SW/MEDQ  D:  09/13/2007  T:  09/13/2007  Job:  [id number]   cc:   Gregary Signs A. Everardo All, MD

## 2011-05-08 NOTE — Discharge Summary (Signed)
[name], [name] NO.:  [id number]   MEDICAL RECORD NO.:  [id number]          PATIENT TYPE:  INP   LOCATION:  4742                         FACILITY:  [facility]   PHYSICIAN:  Veverly Fells. Excell Seltzer, MD  DATE OF BIRTH:  12-02-1941   DATE OF ADMISSION:  05/22/2007  DATE OF DISCHARGE:  05/30/2007                               DISCHARGE SUMMARY   DISCHARGE DIAGNOSIS:  Acute on chronic diastolic congestive heart  failure.   SECONDARY DIAGNOSES:  1. Coronary artery disease.      a.     Status post percutaneous interventions to the left anterior       descending artery and right coronary artery back in 1980 in 1990.      b.     Status post rotational atherectomy and placement of two       TAXUS drug-eluting stents to the right coronary artery in April       2004.      c.     Catheterizations in February 2007, as well as on this       admission on May 23, 2007, revealing nonobstructive coronary       artery disease with patent stents in the right coronary artery.  2. Atrial fibrillation.  3. Hypertension.  4. Hyperlipidemia.  5. Chronic obstructive pulmonary disease.  6. History of previous cerebrovascular accident.  7. Remote history of pulmonary embolism.  8. Type 2 diabetes mellitus.  9. Chronic renal insufficiency complicated by contrast-induced      nephropathy on this admission.  10.Shellfish allergy.  11.Gastroesophageal reflux disease.   ALLERGIES:  1. SHELLFISH.  2. ACE INHIBITOR CAUSE A COUGH.   PROCEDURES:  1. Left heart cardiac catheterization.  2. CT of the chest which was negative for PE.  3. A 2-D echocardiogram revealing an EF of 55-60% without regional      wall motion abnormalities, however, with diastolic dysfunction.   HISTORY OF PRESENT ILLNESS:  A 70 year old, married, Caucasian male with  the above problem list who was in his usual state of health until  approximately 3 days prior to admission when began to experience chest  pain  radiating to his right neck and shoulder occurring at rest but  worse with exertion.  He has also had progressive dyspnea, limiting him  to walking only across a room.  On the day of admission, he had  recurrent chest discomfort unrelieved with nitroglycerin, prompting him  to present [facility] ED.  His ECG was without any acute changes.  And, his pain resolved with narcotics and nitroglycerin.  His cardiac  markers were negative.  He was admitted for further evaluation and  management of presumed unstable angina.   HOSPITAL COURSE:  He underwent left heart cardiac catheterization on May  30th, which revealed nonobstructive coronary artery disease with patent  stents in the RCA.  During catheterization, he was noted to exhibit  atrial fibrillation, and he was initially treated with IV Cardizem and  anticoagulated with heparin and Coumadin.  He eventually converted to a  sinus rhythm on the evening of May  30th, and his Diltiazem was converted  to oral Diltiazem.   As his etiology of dyspnea was not clear, pulmonology was consulted and  CT of the chest was obtained on June 2nd, revealing mild congestive  heart failure.  The patient was treated with intravenous Lasix and then  reinitiated on his at home dose of Lasix at 240 mg b.i.d. which was  initially held pre-catheterization.  With this, he had a brisk diuresis  and improvement in his pulmonary status.  The patient was initially  placed on Solu-Medrol because of some wheezing after catheterization,  however, this was weaned and discontinued.  He was also treated with  Avelox for a brief period which was also discontinued.   Post catheterization, [name] creatinine bumped from a baseline  1.71 to a peak of 2.31.  His renal function has improved steadily since  June 2nd, and we have continued to hold his angiotensin receptor blocker  therapy.   Overall, [name] has been improving daily.  He has been ambulating  without  difficulty.  He is being discharged home today in satisfactory  condition.   DISCHARGE LABORATORY:  Hemoglobin 13.4, hematocrit 40.3, WBC 16.3,  platelets 367, MCV 88.2.  Sodium 140, potassium 3.4, chloride 100, CO2  31, BUN 61, creatinine 1.93, glucose 53.  PT 22.6, INR 1.9.  CK 121, MB  7.8, troponin I 0.03.  Calcium 8.  BNP 226.  Hemoglobin A1c 7.2.   DISPOSITION:  The patient is being discharged home today in good  condition.   FOLLOWUP PLANS/APPOINTMENTS:  1. He has a followup appointment arranged with Dr. Regino Schultze nurse      practitioner on June 17th at 8:45 a.m.  2. Follow up with [facility] on June 9th, at      2:45 p.m.   DISCHARGE MEDICATIONS:  1. Vytorin 10/80 mg q.h.s.  2. Aggrenox b.i.d.  3. Zyrtec 10 mg daily.  4. Aspirin 325 mg daily.  5. Trazodone 100 mg q.h.s.  6. Ferrous sulfate 325 mg t.i.d.  7. Calcitriol 0.25 mcg daily.  8. Folic acid 1 mg daily.  9. Coumadin 5 mg, a half a tablet q.h.s.  10.Diltiazem 300 mg daily.  11.Omeprazole 20 mg daily.  12.Reglan 10 mg q.i.d. a.c./h.s.  13.Lasix 240 mg b.i.d.  14.Zebeta 5 mg daily.  15.Imdur 30 mg daily.  16.Combivent inhaler as-needed.  17.Humalog 35 units q.a.m., 15 units at noon, 40 units q.p.m. 6.  18.Humulin N 60 units q.h.s.  19.Uroxatral 10 mg daily.  20.Nitroglycerin 0.4 mg sublingual p.r.n. chest pain.   OUTSTANDING LABORATORY STUDIES:  None.   DURATION DISCHARGE ENCOUNTER:  Sixty minutes, including physician time.      Nicolasa Ducking, ANP      Veverly Fells. Excell Seltzer, MD  Electronically Signed    CB/MEDQ  D:  05/30/2007  T:  05/30/2007  Job:  [id number]

## 2011-05-08 NOTE — H&P (Signed)
NAME:  [name], [name] NO.:  [id number]  MEDICAL RECORD NO.:  [id number]           PATIENT TYPE:  E  LOCATION:  [facility]                         FACILITY:  [facility]  PHYSICIAN:  Andreas Blower, MD       DATE OF BIRTH:  [birth year]  DATE OF ADMISSION:  05/07/2011 DATE OF DISCHARGE:                             HISTORY & PHYSICAL   PRIMARY CARE PHYSICIAN:  Sean A. Everardo All, MD  CARDIOLOGIST:  Verne Carrow, MD  PULMONOLOGIST:  Barbaraann Share, MD,FCCP  CHIEF COMPLAINT:  Loss of consciousness.  HISTORY OF PRESENT ILLNESS:  [name] is a 70 year old Caucasian gentleman with history of diastolic heart failure, insulin-dependent diabetes, chronic kidney disease stage III, hypertension, hyperlipidemia, GERD, history of PE, history of paroxysmal atrial fibrillation, history of GI bleed was taken off Coumadin on February 2010, who presents with the above complaints.  The patient reports that he was in his usual state of health.  He takes potassium twice daily.  He reports that this evening around 6 p.m. when he was taking his potassium pills.  He choked on the pills and had difficulty breathing subsequently after that lost consciousness.  The patient's friend who was with him noted the change and called 911.  The friend also noted that on his examination, the patient had spit out the potassium pills.  The fire department is about 2 blocks away from where the patient lives and they were able to get to the patient in a short period of time.  The patient per the friend had been breathing by the time EMS arrived but was still labored.  The patient had a nasal trumpet placed and was bagging him.  The patient does not recall passing out or choking or coughing.  The only thing the patient recalls after taking his medications was waking up in the ambulance.  The patient in the emergency department was back to his baseline per the patient's friend. Given the patient had lost  consciousness, the hospitalist service was asked to admit the patient for further evaluation.  Denies any recent fevers, did feel some chills today.  Denies any chest pain, shortness of breath.  Denies any abdominal pain, diarrhea.  Denies any headaches or vision changes.  He does report that he takes 2 [name] of oxygen at bedtime for COPD.  He does report that if he does walk short distances, he easily gets out of breath which is not anything new for him.  The patient was given Narcan by EMS.  It was reported by the EMS staff, that the patient did not improve with Narcan; however, improved with bag ventilation.  REVIEW OF SYSTEMS:  All systems were reviewed with the patient was positive as per HPI, otherwise all other systems were negative.  PAST MEDICAL HISTORY: 1. History of coronary artery disease. 2. Hypertension. 3. Insulin-dependent diabetes. 4. History of diastolic heart failure. 5. Chronic kidney disease stage III, baseline creatinine has been     ranging from about 1.7 to 2.03. 6. Hyperlipidemia. 7. GERD. 8. History of PE. 9. History of paroxysmal atrial fibrillation. 10.History of GI bleed in  February 2010, was taken off Coumadin     therapy. 11.COPD.  SOCIAL HISTORY:  The patient quit smoking about 12 years ago.  Denies any alcohol use.  Denies any illegal drugs or substances.  FAMILY HISTORY:  Significant for father dying at the age of 46.  Mother died at the age of the 56 from lung cancer.  Father died at the age of 78 from COPD.  HOME MEDICATIONS: 1. Triamcinolone cream 0.025% one application 3 times day as needed     for rash. 2. Symbicort 160/4.5 mcg 2 puffs inhaled twice daily. 3. NovoLog 3 times a day 30 units the morning, 10 units for lunch, 30     units in the evening. 4. Hydrocodone/APAP 5/500 one tablet every 4 hours as needed for pain. 5. Foltix 2.5 - 25 - 2 one tablet p.o. daily. 6. Furosemide 80 mg in the morning and 40 mg in the evening. 7.  Isosorbide mononitrate 60 mg p.o. q.a.m. 8. Metolazone 2.5 mg on Monday, Wednesday and Friday. 9. Nitroglycerin sublingual 1 spray every 5 minutes with 3 doses as     needed. 10.MiraLax 17 g daily as needed for constipation. 11.Novolin N 30 units subcu daily at bedtime. 12.Spiriva 18 mcg inhaled q.a.m. 13.Omeprazole 20 mg p.o. q.a.m. 14.Potassium chloride 40 mEq p.o. twice daily. 15.Spironolactone 25 mg p.o. q.a.m. 16.Tamsulosin 0.4 mg p.o. at bedtime. 17.Trazodone 100 mg daily at bedtime. 18.Ferrous sulfate 325 mg p.o. twice daily. 19.Diltiazem extended release 180 mg p.o. q.a.m. 20.Rosuvastatin 10 mg p.o. daily at bedtime. 21.Calcitriol 0.25 mcg p.o. q.a.m. 22.Bisoprolol 10 mg p.o. q.a.m. 23.Azelastine 0.15 nasal spray 2 sprays twice daily as needed for     allergies. 24.Aspirin 81 mg p.o. daily. 25.Allopurinol 300 mg p.o. q.a.m. 26.Albuterol inhaler 2 puffs every 6 hours as needed for shortness of     breath.  PHYSICAL EXAMINATION:  VITALS:  Temperature is 98.6, blood pressure is 136/55, heart rate 73, respiration 20, satting at 97% on 2 [name] of oxygen. GENERAL:  The patient was alert and oriented, not appeared to be any acute distress was laying in bed comfortably. HEENT:  Extraocular motions are intact.  Pupils equal, round.  Moist mucous membranes. NECK:  Supple. HEART:  Regular with S1 and S2. LUNGS:  Clear to auscultation bilaterally. ABDOMEN:  Soft, nontender, nondistended.  Positive bowel sounds. EXTREMITIES:  The patient had good peripheral pulses with trace edema. NEURO:  Cranial nerves II through XII grossly intact.  He  has 5/5 motor strength in upper as well as lower extremities.  RADIOLOGY/IMAGING:  The patient had portable chest x-ray, which showed mild bibasilar atelectasis, right greater than left.  LABORATORY DATA:  CBC shows a white count of 12.5, hemoglobin 14.3, hematocrit 42.0, platelet count 257.  Electrolytes showed sodium is 128, potassium 4.0,  chloride 100, BUN 80, creatinine 2.70.  EKG shows sinus rhythm unchanged from previous EKG compared to in October 04, 2011.  ASSESSMENT AND PLAN: 1. Syncope, etiology unclear likely due to a choking.  The     patient had a stress test in February 2010.  We will admit the     patient to telemetry and trend his troponins.  Given the patient     has had a history of PE in the past, we will check a D-dimer and if     positive then will get a V/Q scan.  Suspect that given the     patient's history, more likely to think that the patient choking  on medication is most likely the culprit for the syncope. 2. Chronic kidney disease stage III.  His creatinine is elevated from     baseline and continued to continue home medications.  Monitor for     now. 3. Mild leukocytosis, most likely reactive.  No suspecting infectious     origin at this time. 4. Hyponatremia appears to be chronic in nature, monitor for     now.  May be due to chronic kidney disease. 5. History of diastolic heart failure.  The patient appears     compensated and continue home medications. 6. Insulin-dependent diabetes.  Continue home medications, home     insulin regimen. 7. Hypertension, stable.  Continue home medications with hold     parameters. 8. Hyperlipidemia.  Continue rosuvastatin. 9. History of chronic obstructive pulmonary disease.  Continue home     inhalers and O2, not an active issue at this time. 10.Gastroesophageal reflux disease, stable. 11.Prophylaxis.  Lovenox for deep venous thrombosis prophylaxis. 12.Code status.  The patient is full code.  Time spent on admission talking to the patient and the patient's friend and coordinating care was 50 minutes.   Andreas Blower, MD   SR/MEDQ  D:  05/07/2011  T:  05/07/2011  Job:  [id number]  cc:   Gregary Signs A. Everardo All, MD Verne Carrow, MD  Electronically Signed by Wardell Heath Nyal Schachter  on 05/08/2011 01:15:46 AM

## 2011-05-08 NOTE — Assessment & Plan Note (Signed)
[facility]                            CARDIOLOGY OFFICE NOTE   [name], [name]                        MRN:          [id number]  DATE:09/15/2007                            DOB:          September 24, 1941    IDENTIFICATION:  [name] is a 70 year old gentleman who was  discharged on Saturday from the hospital.  He was admitted on the 19th,  with dehydration and profound hypokalemia with a K of 2.2.  He was given  IV fluids.  On the day of discharge he was clinically improved but his  discharge diuretics were held.  His K on discharge was also greater than  3.  He was told to come in today for further evaluation.  On talking to  the patient since discharge, he just feels blah, kind of washed out.  He  thinks his fluid is building again.  He slept okay last night.  Appetite  has been good.  Ankles are swelling up again some.   CURRENT MEDICATIONS:  By report include:  1. Insulin as directed.  2. Reglan 10 q.i.d.  3. Omeprazole 20.  4. Taztia 300.  5. Bisoprolol 5 daily.  6. Calcitriol daily.  7. Uroxatral 10 daily.  8. Folic acid.  9. Iron supplements 3 daily.  10.Aggrenox 25/200 b.i.d.  11.Isorbid 30 daily.  12.Aspirin 325.  13.Zyrtec 10.  14.Coumadin as directed.  The patient has held Lasix and also metolazone.   PHYSICAL EXAMINATION:  GENERAL:  The patient is in no distress.  VITAL SIGNS:  Blood pressure is 101/60, pulse is 67, weight 240.  NECK:  JVP approximately 8-9.  LUNGS:  Relatively clear.  CARDIAC:  Regular rate and rhythm.  S1 S2.  No S3.  ABDOMEN:  Distended, obese, mild diffuse tenderness.  EXTREMITIES:  Trace edema bilaterally, right greater than left.   IMPRESSION:  Dehydration.  I think his volume has picked up.  He had a  little increase on exam.  I would like to get a B-MET and a BNP today  before telling him what to do with his Lasix.  I will be in touch with  him.  We will arrange for followup with Dr. Juanda [name].  The  patient is  planning  to go to [facility], depending on his labs, we should be able to do  this.  He also had questions if he can get back to working out again.  I  told him it depends on his labs.     Pricilla Riffle, MD, [facility]  Electronically Signed    PVR/MedQ  DD: 09/15/2007  DT: 09/15/2007  Job #: [id number]

## 2011-05-08 NOTE — Assessment & Plan Note (Signed)
[facility]                            CARDIOLOGY OFFICE NOTE   [name], [name]                        MRN:          [id number]  DATE:09/03/2007                            DOB:          Sep 15, 1941    PRIMARY CARE PHYSICIAN:  Dr. Romero Belling.   CLINICAL HISTORY:  [name] returned for a scheduled visit with  complaints of increasing edema in the lower extremities over the last 3  weeks.  He has coronary artery disease, and has had previous  percutaneous coronary interventions of the left anterior descending and  right coronary artery in 1980 and 1990.  He had rotational arthrectomy  and 2 TAXUS stents placed in the right coronary in 2004, and he had  nonobstructive disease at his last catheterization in May 2008.  He has  a history of congestive heart failure related to diastolic dysfunction.  He says he has had no recent chest pain or shortness of breath  associated with his swelling.  He saw a diabetic nutritionist yesterday,  and she was concerned about possible cellulitis in right lower  extremity.   PAST MEDICAL HISTORY:  Significant for:  1. Paroxysmal atrial fibrillation.  2. Chronic obstructive pulmonary disease.  3. History of remote pulmonary embolism.  4. Insulin dependent diabetes.  5. Chronic renal insufficiency with creatinines in the 1.7 range.  6. GERD.  7. Hypertension.  8. Hyperlipidemia.   CURRENT MEDICATIONS:  Include insulin, metoclopramide, Lasix, Prilosec,  Taztia, bisoprolol, calcitriol, Uroxatral, iron, Aggrenox, isosorbide,  aspirin, Zyrtec, and Coumadin.   EXAMINATION:  Blood pressure is 120/70.  Pulse 74 and regular.  I could detect no definite venous distention.  The carotid pulses were  full without bruits.  CHEST:  Clear without rales or rhonchi.  CARDIAC:  Rhythm was regular.  I could hear no murmurs or gallops.  ABDOMEN:  Quite protuberant.  I could feel no hepatosplenomegaly.  There was 3+ edema of  the right lower extremity, and 2+ edema in the  left lower extremity.  The pedal pulses were equal.  There was  questionable mild erythema in the right lower extremity, and the right  lower extremity appeared slightly warmer than the left.   IMPRESSION:  1. Edema lower extremities, probably related to both congestive heart      failure from diastolic dysfunction and venous insufficiency.  2. Diastolic heart failure.  3. Possible cellulitis of the right lower extremity.  4. Coronary artery disease status post multiple PCIs with      nonobstructive disease at last catheterization.  5. Good left ventricular systolic function.  6. Insulin dependent diabetes.  7. Hypertension.  8. Hyperlipidemia.  9. History of pulmonary embolism.  10.Chronic renal insufficiency.  11.Gastroesophageal reflux disease.   RECOMMENDATIONS:  We will plan to add Zaroxolyn 2.5 mg every day to his  current medications.  We will get a BMP, CBC, and BNP today, and get  another BNP next week.  I told him if he loses less than 4 or more than  10 pounds in the 1st week, to call us and we will  adjust his therapy.  We will get a BNP next, and I will see him back in 3 weeks.  I also put  him on Keflex 500 b.i.d. for his cellulitis.     Bruce Elvera Lennox [name], MD, [facility]  Electronically Signed    BRB/MedQ  DD: 09/03/2007  DT: 09/03/2007  Job #: [id number]

## 2011-05-09 LAB — CBC
MCH: 29.3 pg (ref 26.0–34.0)
MCHC: 32.8 g/dL (ref 30.0–36.0)
MCV: 89.2 fL (ref 78.0–100.0)
Platelets: 281 10*3/uL (ref 150–400)
RDW: 14.5 % (ref 11.5–15.5)

## 2011-05-09 LAB — BASIC METABOLIC PANEL
BUN: 57 mg/dL — ABNORMAL HIGH (ref 6–23)
Calcium: 9.8 mg/dL (ref 8.4–10.5)
Creatinine, Ser: 2.07 mg/dL — ABNORMAL HIGH (ref 0.4–1.5)
GFR calc Af Amer: 39 mL/min — ABNORMAL LOW (ref >60)
GFR calc non Af Amer: 32 mL/min — ABNORMAL LOW (ref >60)

## 2011-05-09 LAB — GLUCOSE, CAPILLARY
Glucose-Capillary: 273 mg/dL — ABNORMAL HIGH (ref 70–99)
Glucose-Capillary: 345 mg/dL — ABNORMAL HIGH (ref 70–99)

## 2011-05-09 LAB — MAGNESIUM: Magnesium: 2.4 mg/dL (ref 1.5–2.5)

## 2011-05-11 NOTE — Cardiovascular Report (Signed)
NAME:  [name], [name] NO.:  [id number]   MEDICAL RECORD NO.:  [id number]          PATIENT TYPE:  OIB   LOCATION:  1962                         FACILITY:  [facility]   PHYSICIAN:  Charlies Constable, M.D. [facility] DATE OF BIRTH:  Dec 04, 1941   DATE OF PROCEDURE:  02/15/2006  DATE OF DISCHARGE:                              CARDIAC CATHETERIZATION   CLINICAL HISTORY:  [name] is 70 years old and has had multiple  percutaneous interventions the last of which was placement two long  overlapping Taxus stents in the right coronary artery following rotational  atherectomy.  He also has hyperlipidemia, hypertension, and insulin-  dependent diabetes. Because he has had silent ischemia int the past, we did  a Cardiolite scan and this showed some inferior scar with some suggestion of  pericardial ischemia. For this reason we brought him for evaluation with  angiography. His creatinine was 1.7, so we gave him bicarbonate protocol as  part of the prevention of contrast-induced nephropathy.   PROCEDURE NOTE:  The procedure was performed via the right femoral artery  using arterial sheath and 4-French preformed coronary catheters. A front  wall arterial puncture was performed and Omnipaque contrast was used. In  order to minimize contrast we just did a hand injection of the left  ventricle. The patient tolerated the procedure well and left the lab in  stable condition.   RESULTS:  The aortic pressure was 139/70 with a mean of 99. Left ventricular  pressure was 139/16.   The left main coronary artery was free of significant disease.   The left anterior descending artery gave rise to three septal perforators  and two diagonal branches. There was 50% narrowing in the mid LAD between  the two diagonal branches. There was 50% narrowing in the proximal portion  of the first diagonal branch. There were irregularities throughout the LAD  and diagonal branches, and there was moderately heavy  calcification in the  proximal LAD.   The circumflex artery gave rise to a large ramus branch, atrial branch,  marginal branch, and a posterolateral branch.  There was 50% narrowing in  the marginal branch.   The right coronary artery is a large dominant vessel, gave rise to two right  ventricular branches, a posterior descending branch, and two posterolateral  branches. There was 30% narrowing which was fairly focal in the mid portion  of the two overlapping stents. There was 30% narrowing in the distal right  coronary artery before the posterior descending branch and there was 40%  narrowing just before the takeoff of the posterolateral branches. There was  also 50% narrowing in the mid portion of posterior descending branch.   The left ventriculogram performed in the RAO projection was done with an  hand injection with __________ contrast to accurately assess regional wall,  but the overall function appeared very good.   CONCLUSION:  Nonobstructive coronary artery disease with 50% narrowing in  the mid left anterior descending and 50% narrowing in the first diagonal  branch, 50% narrowing in the marginal branch of the circumflex artery, 30%  narrowing in the  mid right coronary artery within the stent, and 40%  posterolateral and 50% posterior descending stenosis.   RECOMMENDATIONS:  The patient has only nonobstructive coronary artery  disease. Given the findings I think the scan does not represent a true  ischemic scan. Will plan reassurance. We will have the patient come in for a  groin check with the PA in a week and I will see him back in follow-up in a  year.           ______________________________  Charlies Constable, M.D. [name]     BB/MEDQ  D:  02/15/2006  T:  02/15/2006  Job:  [id number]   cc:   [name] All, M.D. [facility]  [address]  [place]  [place] [address]

## 2011-05-11 NOTE — Discharge Summary (Signed)
NAME:  [name], [name]                         ACCOUNT NO.:  [id number]   MEDICAL RECORD NO.:  [id number]                   PATIENT TYPE:  OIB   LOCATION:  6523                                 FACILITY:  [facility]   PHYSICIAN:  Charlies Constable, M.D.                  DATE OF BIRTH:  07/06/1941   DATE OF ADMISSION:  04/13/2003  DATE OF DISCHARGE:  04/14/2003                           DISCHARGE SUMMARY - REFERRING   PROCEDURES:  1. Cardiac catheterization.  2. Coronary arteriogram.  3. Left ventriculogram.  4. PTCA and stent times 2:1 vessel.   HOSPITAL COURSE:  The patient is a 70 year old male patient of [name]  and Dr. [name].  He has undergone multiple angioplasties to the RCA with the  last one in 1996.  He was seen in the office on 04/07/2003 for increased  shortness of breath at rest and with exertion and dull substernal chest  pain.  It was felt that he needed further evaluation of catheterization, and  he was scheduled for this on 04/13/2003.  The cardiac catheterization  performed on 04/13/2003 showed a normal left main and an LAD with luminal  irregularities.  There was a first diagonal with a 90% stenosis.  There was  a ramus intermedius with a 40% stenosis, and the circumflex had no critical  disease.  The OM had an 80% stenosis, but this was a small vessel.  The RCA  had a 99%, proximal 70%, and an 80% proximal to mid stenoses.  There was a  50% stenosis in the PDA and a 70% stenosis in the PLA.  He had HSRA and PTCA  with Taxus stents times two to the RCA, reducing the three proximal stenoses  of 99, 70, and 80 to less than 10%.  The patient tolerated the procedure  well.   The next day, there was a minimal ooze at his cath site, but otherwise his  groin was stable without ecchymosis, hematoma, or bruit.  His post procedure  enzymes were negative.  He had a slight drop of his hemoglobin post  procedure, but it was stable, and he had no symptoms from this.  His blood  sugar was elevated at 310, but his Glucophage had been held.  He is to  continue on his other home medications and restart the Glucophage on Friday.  The patient is considered stable for discharge on 04/14/2003.   LABORATORY VALUES:  Sodium 135, potassium 3.7, chloride 103, CO2 of 26, BUN  19, creatinine 1.1, glucose 310.  Hemoglobin 11.1, hematocrit 32.9, WBCs  12.7, platelets 267.  Post procedure CK-MB 50/1.0.   Chest x-ray, cardiomegaly with no acute disease.   DISCHARGE CONDITION:  Improved.   DISCHARGE DIAGNOSES:  1. Unstable anginal pain, status post percutaneous transluminal coronary     angioplasty and Taxus stent times two to the right coronary artery this     admission.  2. History  of multiple percutaneous transluminal coronary angioplasties to     the right coronary artery with the most recent one prior to this     admission in 1996.  3. Residual coronary artery disease in the diagonal and obtuse marginal as     well as patent ductus arteriosus and posterolateral and ramus     intermedius, between 40 to 90%.  4. Preserved left ventricular function with an ejection fraction of 60% by     catheterization this admission.  5. Hypertension.  6. Hyperlipidemia.  7. Diabetes mellitus.  8. History of iron-deficiency anemia.  9. History of tobacco, quit in 2003.  10.      Family history of coronary artery disease.   DISCHARGE INSTRUCTIONS:  1. His activity level is to include no driving, sexual or strenuous activity     for two days.  2. He is to stick to a low-fat diabetic diet.  3. He is to call the office for problems with the cath site.  4. He has an appointment with the P.A. for Dr. [name] on Apr 30, 2003, at     10:30 a.m.  5. He is to see [name] as needed.   DISCHARGE MEDICATIONS:  1. Aspirin 325 mg one to two tablets per day.  2. Plavix 75 mg daily.  3. Lipitor 40 mg daily.  4. Glucophage 1000 mg b.i.d., restart Friday.  5. Lopressor 50 mg one-half tablet  b.i.d.  6. Actos 45 mg daily.  7. Starlix 120 mg q.a.c.  8. Advair 250/50 mcg b.i.d.  9. Hyzaar 25/100 mg daily.  10.      Bumex 4 mg daily.  11.      Nitroglycerin p.r.n.     Lavella Hammock, P.A. [facility]                  Charlies Constable, M.D.    RG/MEDQ  D:  04/14/2003  T:  04/14/2003  Job:  [id number]   cc:   Gregary Signs A. Everardo All, M.D. [facility]   Charlies Constable, M.D.

## 2011-05-11 NOTE — Assessment & Plan Note (Signed)
[facility]                            CARDIOLOGY OFFICE NOTE   [name], [name]                        MRN:          [id number]  DATE:04/07/2007                            DOB:          11-Nov-1941    PRIMARY CARE PHYSICIAN:  Dr. Romero Belling.   NEPHROLOGIST:  Dr. Casimiro Needle.   CLINICAL HISTORY:  [name] is 70 years old and returns for  management of his coronary heart disease and congestive heart failure  related to diastolic dysfunction. He has had multiple percutaneous  interventions and was last cathed in February of 2007 at which time he  had nonobstructive disease. He has had problems with volume overload  related to diastolic dysfunction and renal insufficiency and is on high  dose diuretics for this. He also sees Dr. Lowell Guitar for this. He says Dr.  Lowell Guitar checked his creatinine in February and it was down to 1.7 from a  high of about 3.1. He says he has gained some fluid recently and he says  he thinks this is related to him being in the car and having his feet  low dependent. He said generally after this happens after he is home for  a few days he will diurese and his fluid status will improve.   PAST MEDICAL HISTORY:  Significant for insulin dependent diabetes,  hypertension, chronic renal insufficiency, hyperlipidemia, and chronic  obstructive pulmonary disease.   CURRENT MEDICATIONS:  Include; Lasix, Zyrtec, iron, Humalog, Humulin,  trazodone, Vytorin, Prilosec, metoprolol, Aggrenox, Uroxatral,  hydrocodone, and Cozaar.   On examination today, the blood pressure is 142/70, and the pulse is 68  and regular.  There is no venous distension. The carotid pulses were full without  bruits.  CHEST: Clear.  CARDIAC: Rhythm regular. I could hear no murmurs or gallops.  ABDOMEN: Soft without hepatosplenomegaly although it was quite  protuberant.  There was 2 + peripheral edema.   An electrocardiogram showed sinus rhythm and was  normal.   IMPRESSION:  1. Coronary artery disease, status post multiple percutaneous coronary      interventions with non obstructive disease at last catheterization.  2. Congestive heart failure related to diastolic dysfunction with some      volume overload at present.  3. Chronic renal insufficiency with last creatinine 1.7.  4. Insulin dependent diabetes.  5. Good left ventricular systolic dysfunction.  6. Hypertension.  7. Hyperlipidemia.  8. Chronic obstructive pulmonary disease.   RECOMMENDATIONS:  I think Mr. Kalla is doing pretty well. He does  have some volume overload. He thinks this will correct it's self over  the next few days, so we will not adjust his medications since he is  already on a pretty high dose diuretic. I told him to call us if he does  not lose some of the fluid over the next couple of days. We will get a  BMP and CBC on him today and I will see him back in 6 months. Dr. Lowell Guitar  is checking him about every 6 months.     Bruce Elvera Lennox [name], MD, [facility]  Electronically  Signed    BRB/MedQ  DD: 04/07/2007  DT: 04/07/2007  Job #: [id number]

## 2011-05-11 NOTE — Discharge Summary (Signed)
[name], [name] NO.:  [id number]   MEDICAL RECORD NO.:  [id number]          PATIENT TYPE:  INP   LOCATION:  5023                         FACILITY:  [facility]   PHYSICIAN:  Valerie A. Felicity Coyer, MDDATE OF BIRTH:  1941/08/26   DATE OF ADMISSION:  08/19/2006  DATE OF DISCHARGE:  08/23/2006                                 DISCHARGE SUMMARY   DISCHARGE DIAGNOSES:  1. Acute pyelonephritis secondary to Escherichia coli.  2. Diabetes type 2.  3. Acute on chronic renal insufficiency.   HISTORY OF PRESENT ILLNESS:  [name] is a 70 year old white male  admitted on August 19, 2006, with complaints of nausea, vomiting and flank  pain.  The patient on admission apparently woke up and was unable to keep  any solids or liquids down and vomited all day on Sunday prior to admission.  He was admitted for further evaluation and treatment.   PAST MEDICAL HISTORY:  1. Coronary artery disease, status post multiple PCI.  2. CHF with diastolic dysfunction.  3. Insulin-dependent diabetes mellitus.  4. Hypertension.  5. Hyperlipidemia.  6. COPD.  7. Chronic renal insufficiency with diabetic nephropathy and baseline      creatinine around 2.0.  8. Obesity.  9. Allergic rhinitis.  10.History of PE.  11.History of CVA with residual left hand numbness.   COURSE OF HOSPITALIZATION.:  Problem 1.  ACUTE ESCHERICHIA COLI PYELONEPHRITIS:  The patient was admitted  and a CT was performed of the abdomen, which showed mild perinephric  inflammation and mild circumferential bladder wall thickening as well as  prostate enlargement.  He was noted to have a leukocytosis on admission with  a white blood cell count of 23,000.  He was also noted to be hyponatremic on  admission with a potassium of 2.9.  this was repleted.  Urine was sent for  culture and grew greater than 100,000 colonies of E. coli sensitive to  Cipro.  He was initially started on IV Cipro, which was later successfully  changed to p.o. Cipro.  The patient is currently afebrile and is tolerating  p.o.'s.  At this time plan to continue Cipro for an additional 7 days and  will defer any further treatment to the patient's primary care.   Problem 2.  ACUTE ON CHRONIC RENAL INSUFFICIENCY:  The patient's baseline  creatinine is approximately 2.1.  He was noted on admission to have a  creatinine of 3.1.  Creatinine at time of discharge is 1.9, having returned  to baseline.   Problem 3.  DIABETES TYPE 2:  It was reinforced with the patient the  importance of maintaining tight glycemic control at home.  At this time plan  to return the patient to his preadmission insulin dosing.  This will need  continued outpatient monitoring.   Problem 4.  DIASTOLIC CONGESTIVE HEART FAILURE:  Lasix was held temporarily  during this admission secondary to acute on chronic renal insufficiency  accompanied by hypotension and was later restarted without complications.  He is currently compensated.   PERTINENT LABORATORIES AT DISCHARGE:  BUN 28, creatinine 1.9, sodium 131,  potassium  4.0.   MEDICATIONS AT DISCHARGE:  1. Hyzaar 100/25 mg one tablet at daily.  2. Cipro 500 mg p.o. b.i.d. for 7 days.  3. Lopressor 50 mg p.o. in the morning and 25 mg p.o. in the evening.  4. Aggrenox one capsule p.o. b.i.d.  5. Zyrtec 10 mg p.o. daily.  6. Vytorin 10/80 mg one tablet p.o. daily in the evening.  7. Trazodone 100 mg p.o. q.h.s.  8. Prilosec 20 mg p.o. daily.  9. Iron 325 mg p.o. t.i.d.  10.Calcitriol 0.25 mg p.o. daily.  11.Folate  mg p.o. daily.  12.Lasix __________ mg p.o. b.i.d.  13.Humalog q.a.c. and h.s. (35 units, 15 units, 60 units, 15 units).  14.Humulin 60 units subcu q.h.s.   DISPOSITION:  The patient will be discharged to home.   FOLLOW UP:  The patient is to follow up with [name] is scheduled on  September 4.  He is instructed to call [name] should he develop fever  over 101, chills, sweats, muscle  aches, back pain or weakness.   He will need a follow-up BMET to evaluate renal function and sodium, as the  patient's sodium was noted to be low during this admission.  Discharge  sodium is 131.  The patient's hydrochlorothiazide has been held and his  Cozaar had been decreased during this admission.  As a result, we will hold  Hyzaar at time of discharge until follow-up with [name] as the patient  had been noted to have low blood pressure during this admission.     ______________________________  [name], PA      Valerie A. Felicity Coyer, MD  Electronically Signed    MO/MEDQ  D:  08/23/2006  T:  08/23/2006  Job:  [id number]   cc:   Gregary Signs A. Everardo All, MD

## 2011-05-11 NOTE — H&P (Signed)
NAME:  [name], [name] NO.:  [id number]   MEDICAL RECORD NO.:  [id number]          PATIENT TYPE:  EMS   LOCATION:  ED                           FACILITY:  [facility]   PHYSICIAN:  Sean A. Everardo All, M.D. [facility] OF BIRTH:  [birth year]   DATE OF ADMISSION:  01/24/2005  DATE OF DISCHARGE:                                HISTORY & PHYSICAL   REASON FOR ADMISSION:  Hypotension.   HISTORY OF PRESENT ILLNESS:  The patient is a 70 year old man with 8 days of  shortness of breath which is now moderate.  He as associated severe  dizziness and intermittent fever.  He also has a productive quality cough.  He was seen in the emergency department a week ago and given several bags of  IV fluids with only temporary improvement in his symptoms.   He states his glucoses have been in the 300s recently, but he has not been  able to check very much because his meter is broken.   PAST MEDICAL HISTORY:  1.  CAD.  2.  Dyslipidemia.  3.  Diabetes nephropathy.  4.  Allergic rhinitis.  5.  Cough due to ACE inhibitor.  6.  COPD.   MEDICATIONS:  1.  Mevacor 80 mg q.h.s.  2.  Iron 325 mg daily.  3.  Plavix 75 mg daily.  4.  Hyzaar 100/25, 1 daily.  5.  Aspirin 325 mg daily.  6.  Bumex 3 mg daily.  7.  Zestoretic 20/12.5, 2 daily.  8.  Zyrtec 10 mg daily.  9.  Lopressor 75 mg b.i.d.  10. Starlix 120 mg t.i.d. (q.a.c.).   SOCIAL HISTORY:  The patient is married.  He is retired.  His wife is with  him.   FAMILY HISTORY:  Several family members have had minor upper respiratory  illnesses.   REVIEW OF SYSTEMS:  He also has some headache.  He has lost a few pounds.  He has some muscle weakness and some blurry vision but he denies the  following:  Double vision, ear ache, syncope, urinary incontinence,  decreased urinary force, abdominal pain, rectal bleeding, hematuria, and  skin rash.  He does have a little bit of chest pain but only with his cough.   PHYSICAL EXAMINATION:  VITAL  SIGNS:  Blood pressure 52/42.  Heart rate is  62.  Temperature 96.8.  The weight is 188.  GENERAL:  Appears only slightly ill.  Skin not diaphoretic.  HEENT:  No periorbital swelling.  No proptosis pharynx.  No erythema.  Tympanic membranes:  No erythema.  NECK:  Supple.  No goiter.  CHEST:  Clear to auscultation.  CARDIOVASCULAR:  No JVD.  No edema.  Regular rate and rhythm.  No murmur.  Pedal pulses are intact, and there is no bruit at the carotid arteries.  ABDOMEN:  Soft, obese, nontender, no hepatosplenomegaly, no mass.  GENITAL/RECTAL EXAMINATION:  Cannot be done now due to patient's condition.  EXTREMITIES:  I do not appreciate any deformity.  NEUROLOGIC:  Alert, well-oriented.  Does not appear anxious nor depressed  now, and cranial nerves are intact,  and gait is observed to be slightly  unstable in the office.   IMPRESSION:  1.  Upper respiratory infection versus pneumonia.  2.  Hypotension which may be due to a combination of factors, including his      medications and his current illness.  3.  Type 2 diabetes with apparently recent poor control (his Glucophage had      to be changed to Starlix recently because of renal insufficiency).   PLAN:  1.  Blood cultures.  2.  Antibiotics.  3.  Intravenous fluids.  4.  Insulin.  5.  I discussed code status with patient, and he states he wants to be full      code but would not want to be started nor maintained on artificial life      support measures if there was not a reasonable of a functional recovery.  6.  Hold Bumex.      SAE/MEDQ  D:  01/24/2005  T:  01/24/2005  Job:  [id number]

## 2011-05-11 NOTE — Discharge Summary (Signed)
[name], [name]               ACCOUNT NO.:  [id number]   MEDICAL RECORD NO.:  [id number]          PATIENT TYPE:  INP   LOCATION:  0349                         FACILITY:  [facility]   PHYSICIAN:  Valetta Mole. Swords, M.D. Romualdo Bolk OF BIRTH:  Apr 23, 1941   DATE OF ADMISSION:  01/24/2005  DATE OF DISCHARGE:  01/28/2005                                 DISCHARGE SUMMARY   DISCHARGE DIAGNOSES:  1.  Presumed pulmonary embolus.  2.  Anemia, unknown etiology - has had gastrointestinal evaluation.  3.  Coronary artery disease.  4.  Type 2 diabetes.  5.  Dyslipidemia.  6.  Diabetic nephropathy.  7.  History of allergic rhinitis.  8.  History of chronic obstructive pulmonary disease.  9.  Temporary hypotension.  10. Acute renal failure.   HOSPITAL PROCEDURES:  Esophagogastroduodenoscopy January 26,2006 (prior to  hospitalization).  was normal.  V/Q scan demonstrated a high, intermediate  probability of pulmonary embolus.  There were subsegmental defects in the  lower lobes bilaterally and a segmental defect in the right lower lobe.  There was bilateral air trapping consistent with COPD.  Renal ultrasound  demonstrated distended bladder, otherwise  negative ultrasound of the  kidneys.   HOSPITAL LABORATORY DATA:  Discharge CBC:  Hemoglobin 9.3, white count 4.2,  platelet count 366,000.  BMET prior to discharge (01/27/05):  Sodium 137,  potassium 3.8, chloride 107, CO2 23, glucose 219, BUN 19.   CONDITION ON DISCHARGE:  Improved.   FOLLOW UP PLANS:  I have asked the patient to see [name] in 2 days.  The patient will need follow-up pro times.  First dose of Coumadin was given  in-hospital January 28, 2005.   HOSPITAL COURSE:  PROBLEM NO. 1.  SHORTNESS OF BREATH:  The patient was  admitted to the hospitalist service on January 24, 2005; see Dr. George Hugh  note for details.  Initially, the patient was thought to have an upper  respiratory tract infection.  At the time of my examination on  January 26, 2005, he continued to have some shortness of breath with mildly elevated D-  dimer.  On January 26, 2005, review of the V/Q scan was enough evidence for  me to diagnose him with pulmonary emboli and start Lovenox therapy.  First  dose of Coumadin January 28, 2005.   PROBLEM NO. 2. HYPOTENSION:  The patient was admitted with hypotension.  In  retrospect, this was likely due to pulmonary emboli, and this has resolved  during his hospital stay.  I have decreased the patient's high blood  pressure medicines, and this will need to be followed as an outpatient.   PROBLEM NO. 3. ANEMIA:  The patient with known anemia. He has had  colonoscopy October 05, 2003 that was normal. EGD January 18, 2005 also  normal.  I did not see that there was any reason to perform any additional  studies prior to anticoagulation.   PROBLEM NO. 4.  TYPE 2 DIABETES:  Patient with elevated blood sugars during  the hospital stay.  The patient was treated with L[place] and sliding scale  insulin  at meals and before bedtime.  There had been slow improvement of the  patient's blood sugars.  This will need to be monitored as an outpatient.  The patient understands to follow his blood sugars before each meal and  following a sliding scale insulin.      BHS/MEDQ  D:  01/28/2005  T:  01/28/2005  Job:  [id number]   cc:   Gregary Signs A. Everardo All, M.D. [facility]

## 2011-05-11 NOTE — Assessment & Plan Note (Signed)
[facility]                              CARDIOLOGY OFFICE NOTE   [name], [name]                        MRN:          [id number]  DATE:09/25/2006                            DOB:          22-Oct-1941    PRIMARY CARE PHYSICIAN:  Sean A. [name] All, MD   CLINICAL HISTORY:  [name] is 70 years old and has coronary artery  disease, status post multiple percutaneous coronary interventions with  nonobstructive disease at last catheterization and good LV function.  He has  a history of volume overload related to diastolic dysfunction and renal  insufficiency.  We had been titrating his diuretics, and he seems to have  stabilized and feels like his volume status was pretty good.  He has  shortness of breath with exertion, but this has not changed.  He has had no  chest pain.  He was hospitalized in August with an episode of  pyelonephritis.  His creatinine at that time went up to 3.1, but it came  back to baseline and was 2.2 on his measure a few weeks ago.   His past medical history is significant for insulin-dependent diabetes,  hypertension, chronic renal insufficiency, hyperlipidemia, and chronic  obstructive pulmonary disease.   His current medications include Lasix, Zyrtec, iron, Humalog, trazodone,  Vytorin, Prilosec, metoprolol, Aggrenox, Tylenol, and UroXatral.  He had  been on Hyzaar, but this was discontinued due to the low blood pressure.   PHYSICAL EXAMINATION:  VITAL SIGNS:  On examination today, the blood  pressure was 144/78 and the pulse 68 and regular.  NECK:  There was no venous distention.  The carotid pulses were full, and  there were no bruits.  CHEST:  The chest was clear without rales or rhonchi.  CARDIAC:  Cardiac rhythm was regular.  The heart sounds were normal but  somewhat decreased.  There were no murmurs or gallops.  ABDOMEN:  Protuberant.  Bowel sounds were normal.  There was no  hepatosplenomegaly.  EXTREMITIES:   There was trace peripheral edema, and the pedal pulses were  equal.   IMPRESSION:  1. Volume overload related to diastolic dysfunction and renal      insufficiency, now improved, and close to euvolemic.  2. Coronary artery disease, status post prior percutaneous coronary      interventions with nonobstructive disease at last catheterization.  3. Insulin-dependent diabetes.  4. Chronic renal insufficiency with creatinines in the range of 2.2.  5. Good left ventricular systolic function.  6. Hypertension.  7. Hyperlipidemia.  8. Chronic obstructive pulmonary disease.   RECOMMENDATIONS:  I think [name] is doing well and both his renal  function and volume status appear to have stabilized.  We will plan to  continue his same medications, except I will plan to resume his ARB for  renal protection with his known diabetes.  He was on 100/25 of Hyzaar, and  this was held due to his low blood pressure.  I do not think he needs the  hydrochlorothiazide since he is on high dose Lasix.  We will start him back  on Cozaar at a low dose of 25 and check a BNP next week.  He is scheduled to  see [name] later this month, and he can decide about titrating this, or  [name] can when he sees him in followup.  I will see him back in  followup in six months.            ______________________________  [name]. [name] Chance, MD, [facility]     BRB/MedQ  DD:  09/25/2006  DT:  09/26/2006  Job #:  [id number]

## 2011-05-11 NOTE — H&P (Signed)
NAME:  [name], [name] NO.:  [id number]   MEDICAL RECORD NO.:  [id number]          PATIENT TYPE:  EMS   LOCATION:  MAJO                         FACILITY:  [facility]   PHYSICIAN:  Unice Cobble, MD     DATE OF BIRTH:  10/30/1941   DATE OF ADMISSION:  08/19/2006  DATE OF DISCHARGE:                                HISTORY & PHYSICAL   PRIMARY CARE PHYSICIAN:  Romero Belling, M.D.   REASON FOR ADMISSION:  The patient is being admitted to [facility] Medicine  Service.   DIAGNOSIS:  Urinary tract infection.   CHIEF COMPLAINT:  Nausea and vomiting with flank pain.   HISTORY OF PRESENT ILLNESS:  This patient is a 70 year old white male with a  past medical history of diabetes mellitus, type 2, insulin dependent,  hyperlipidemia, hypertension, peripheral vascular disease, COPD, compensated  diastolic congestive heart failure, who presents with complaints of nausea  and vomiting and flank pain, starting Sunday morning. The patient woke up  and was unable to keep any solids or liquids down. He vomited all Sunday and  slept overnight and managed to get a small amount of toast in this morning  but nothing else. No hematemesis or coffee grounds in his vomitus. No  hematochezia or melena. He did have fever and chills during this time frame.  He also had a low urine output since Sunday morning as well as no bowel  movement since that time. The only other time that he has had this in the  past is when he had a pulmonary embolism in the distant past, he had  decreased urine output at that time.   PAST MEDICAL HISTORY:  1. Coronary artery disease status post multiple PCI.  2. Congestive heart failure with diastolic dysfunction.  3. Insulin-dependent diabetes mellitus.  4. Hypertension.  5. Hyperlipidemia.  6. Chronic obstructive pulmonary disease.  7. Chronic renal insufficiency with diabetic nephropathy. Baseline      creatinine is around 2.0.  8. Obesity.  9. Allergic  rhinitis.  10.History of pulmonary embolism.  11.History of stroke with residual left hand numbness.   ALLERGIES:  SHELLFISH (causes swelling and rash), ACE INHIBITORS (cause  cough).   MEDICATIONS:  1. Humalog q.i.d. before meals (35, 15, 60, 15).  2. Humulin 60 q.h.s.  3. Hyzaar 50/12 daily.  4. Aggrenox 25/200 b.i.d.  5. Metoprolol 50 a.m. and 25 p.m.  6. Lasix 80 mg 3 tablets b.i.d.  7. Zyrtec 10 mg daily.  8. Vytorin 10/80 q.h.s.  9. Trazadone 100 mg q.h.s.  10.Omeprazole 20 mg daily.  11.Iron sulfate 325 mg t.i.d.  12.Calcitriol 25 mg daily.  13.Tylenol 500 mg b.i.d.  14.Folate 1 mg daily.   SOCIAL HISTORY:  The patient is married and lives with his wife. No tobacco,  alcohol, or drugs. He quit tobacco 10 years ago.   FAMILY HISTORY:  His mother died of cancer. His father is alive and healthy.   REVIEW OF SYSTEMS:  Complete review of systems is done and found to be  negative as mentioned in the HPI.   PHYSICAL EXAMINATION:  VITAL SIGNS:  Pulse 97 with a blood pressure of  116/49. Temperature is 100.5. Respiratory rate is 16 and he is 98% on room  air.  GENERAL:  An obese white male in no acute distress.  HEENT:  Moist mucous membranes. PERRLA, EO MI, oropharynx clear.  NECK:  Supple without jugular venous distention, bruits, lymphadenopathy, or  thyromegaly.  CHEST:  Clear to auscultation anteriorly.  CARDIOVASCULAR:  Regular rate and rhythm without murmur, rub, or gallop.  Normal S1 and S2.  ABDOMEN:  Round with good bowel sounds. He is soft and tender in the flanks  only. He has a ventral hernia.  EXTREMITIES:  Warm and well perfused without clubbing, cyanosis, or edema.  2+ pulses bilaterally.  NEUROLOGIC:  Alert and oriented times three. Grossly intact with 2+ reflexes  throughout. His left hand has some medial numbness as mentioned in the HPI.  PSYCHIATRIC:  Normal affect.   LABORATORY DATA:  White count is 23.8 with hemoglobin of 13.1 and platelet  count  of 270,000. Chemistry panel is remarkable for a potassium of 2.8, BUN  of 53, and creatinine of 3.1 up from 2.1. AST, ALT, alkaline phosphatase,  and total bilirubin, lipase, troponin are all within normal limits.  Urinalysis reveals large blood with large leukocyte esterase and many  bacteria. Many epithelial cells are also seen.   Chest x-ray shows borderline ileus.   EKG shows normal sinus rhythm with no acute changes.   ASSESSMENT/PLAN:  This is a 70 year old white male with an infectious  process, likely pyelonephritis versus a urinary tract infection. We will  admit him to medicine and give gentle hydration and start antibiotics in the  form of ciprofloxacin. His CHF appears to be well compensated and will  continue his current outpatient therapy.  1. PYELONEPHRITIS:  Ciprofloxacin, CT abdomen.  2. CONGESTIVE HEART FAILURE:  Continue home prescription.  3. DIABETES MELLITUS:  Continue Humulin with insulin sliding scale.  4. CHRONIC RENAL INSUFFICIENCY:  Watch creatinine closely as it has risen      likely secondary to dehydration. Consider renal consult in the morning.  5. PROPHYLAXIS:  With heparin SQ and proton pump inhibitor.           ______________________________  Unice Cobble, MD     ACJ/MEDQ  D:  08/20/2006  T:  08/20/2006  Job:  [id number]   cc:   Gregary Signs A. Everardo All, MD  Willow Ora, MD

## 2011-05-11 NOTE — Assessment & Plan Note (Signed)
[facility]                              CARDIOLOGY OFFICE NOTE   SEARCY, MIYOSHI                        MRN:          [id number]  DATE:07/29/2006                            DOB:          [birth year]    PRIMARY CARE PHYSICIAN:  Sean A. Everardo All, MD.   CLINICAL HISTORY:  [name] returned for follow-up management of his  congestive heart failure and volume overload related to diastolic  dysfunction and renal insufficiency.  I saw him back in late June and he was  having increased edema of the lower extremities and he had creatinines in  the range of 1.9 to 2.4.  We increased his Bumex dose at that time.  Subsequently, he saw Casimiro Needle who switched him to Lasix 80 mg three  tablets b.i.d. With that change he did lose a couple of pounds and his  shortness of breath improved.   PAST MEDICAL HISTORY:  1.  Coronary artery disease status post multiple percutaneous coronary      interventions.  A cath recently showing nonobstructive disease.  2.  Insulin dependent diabetes.  3.  Hypertension.  4.  Hyperlipidemia.  5.  COPD.   CURRENT MEDICATIONS:  1.  Lasix.  2.  Zyrtec.  3.  Iron.  4.  Humalog.  5.  Humulin.  6.  Hyzaar.  7.  Trazodone.  8.  Vytorin.  9.  Prilosec.  10. Metoprolol.  11. Aggrenox.  12. Tylenol.   PHYSICAL EXAMINATION:  VITAL SIGNS:  Blood pressure 139/75, pulse 85 and  regular.  NECK:  There was no venous distention.  The carotid pulses were full without  bruits.  CHEST:  Clear.  CARDIOVASCULAR:  Regular rhythm.  I could hear no significant murmurs.  ABDOMEN:  Quite protuberant.  I cannot feel any hepatosplenomegaly.  The  abdomen was soft with normal bowel sounds.  EXTREMITIES:  There was trace to  1+ peripheral edema.  The pedal pulses were equal.   IMPRESSION:  1.  Volume overload related to congestive heart failure with diastolic      dysfunction and renal insufficiency, somewhat improved.  2.  Coronary  artery disease status post multiple percutaneous coronary      interventions with nonobstructive disease at last catheterization.  3.  Good left ventricular systolic function.  4.  Insulin dependent diabetes.  5.  Hypertension.  6.  Chronic renal insufficiency.  7.  Hyperlipidemia.  8.  Chronic obstructive pulmonary disease.   RECOMMENDATIONS:  I think [name] is doing a little better.  On examination, he  does not appear grossly volume overloaded. His weight is up and this makes  assessment somewhat difficult.  Will plan to continue same medications.  I  will get a BMP in a month and I will see him back in two months.  He  indicated he has an appointment to see Dr. Lowell Guitar back in October.  Dr.  Lowell Guitar did mention that he would need dialysis sometime in the near future.  Bruce Elvera Lennox [name], MD, [facility]    BRB/MedQ  DD:  07/29/2006  DT:  07/29/2006  Job #:  [id number]

## 2011-05-11 NOTE — Op Note (Signed)
NAME:  [name], [name]                         ACCOUNT NO.:  [id number]   MEDICAL RECORD NO.:  [id number]                   PATIENT TYPE:  AMB   LOCATION:  DSC                                  FACILITY:  [facility]   PHYSICIAN:  Vikki Ports, M.D.         DATE OF BIRTH:  21-Jun-1941   DATE OF PROCEDURE:  08/08/2004  DATE OF DISCHARGE:                                 OPERATIVE REPORT   PREOPERATIVE DIAGNOSIS:  Basal cell carcinoma of the left forearm.   POSTOPERATIVE DIAGNOSIS:  Basal cell carcinoma of the left forearm.   PROCEDURE:  Wide excision.   ANESTHESIA:  Local MAC.   DESCRIPTION OF PROCEDURE:  The patient was taken to the operating room and  placed in the supine position.  After MAC anesthesia was induced, the left  arm was prepped and draped in normal sterile fashion.  Using an elliptical  incision around the cancerous lesion, I dissected down the subcutaneous fat  excising all tissue en bloc.  Margins were marked.  Flaps were created with  electrocautery, and skin was closed with interrupted with 3-0 vertical  mattress nylon sutures.  Sterile dressing was applied.  The patient  tolerated the procedure well and went to PACU in good condition.                                               Vikki Ports, M.D.    KRH/MEDQ  D:  08/08/2004  T:  08/09/2004  Job:  [id number]

## 2011-05-11 NOTE — Cardiovascular Report (Signed)
NAME:  [name], [name]                         ACCOUNT NO.:  [id number]   MEDICAL RECORD NO.:  [id number]                   PATIENT TYPE:  OIB   LOCATION:  2899                                 FACILITY:  [facility]   PHYSICIAN:  Charlies Constable, M.D.                  DATE OF BIRTH:  1941/03/19   DATE OF PROCEDURE:  04/13/2003  DATE OF DISCHARGE:                              CARDIAC CATHETERIZATION   PROCEDURE:  Cardiac catheterization and percutaneous coronary intervention.   CLINICAL HISTORY:  The patient is 70 years old and has documented coronary  artery disease with multiple previous percutaneous interventions of the LAD  and right coronary artery back in the 5s and early 1990s.  He recently  developed recurrent angina with exertion over the past several months and  was seen in the office in consultation, and scheduled for evaluation  angiography.  He also has peripheral vascular disease with some claudication  in his right lower extremity.   DESCRIPTION OF PROCEDURE:  The procedure was performed in the right femoral  artery using an arterial sheath and 6-French preformed coronary catheters.  A front wall arterial puncture was performed and Omnipaque contrast was  used.  After completion of the diagnostic study, we made the decision to  proceed with intervention on the right coronary artery.   The patient was given 300 mg of Plavix and was given an Angiomax bolus and  infusion.  We used a 7-French JR4 guiding catheter with side holes.  We  crossed the functional totally occlusion in the proximal right coronary  artery with a Rota Floppy wire without too much difficulty.  We went in with  a 1.5 burr and performed 6 runs at approximately 160,000 RPMs for 10 to 12  seconds each.  We then used a 2.25 x 20-mm Quantum Maverick and performed 4  inflations at 12 atmospheres for 30 seconds up and down the vessel passage  distal into the mid-to-distal vessel.  We then deployed a 2.5 x 24-mm  Taxus  stent, deploying the distal end of the stent just past the marginal branch  at the junction of the mid and distal third of the vessel.  We deployed this  with one inflation of 11 atmospheres for 30 seconds and a second inflation  of 16 atmospheres for 30 seconds with the balloon inside the distal edge.  We then passed a second 2.5 x 24-mm Taxus stent overlapping the first stent  and extending near the ostium, and deployed this with one inflation of 16  atmospheres for 30 seconds.  We then went back in with a 2.5 x 20-mm Quantum  Maverick and post dilated the stent, but there was a lesion in the mid  portion which did not post dilate up to 21 atmospheres.  We went back in  with a 2.5 x 15-mm Fairview Ranger and performed numerous inflations up to 22  atmospheres still without being able to fully expand the balloon.  Finally,  we went up to 26 atmospheres and were able to expand the balloon and expand  the stent.  This inflation was performed for 35 seconds.  Repeat diagnostic  study was then performed with the guiding catheter.   The patient was consented for the CETP study and we performed intravascular  ultrasound in the circumflex artery.  We used a 6-French JL4 guiding  catheter with side holes and a short luge wire.  We passed the wire down the  intermediate branch without difficulty.  We passed an Atlantis ultrasound  catheter down the intermediate branch and did automatic pullback.  There was  some moderate amount of calcification in the vessel and there was a short  segment where there was NERD.   The procedure was long, but the patient tolerated the procedure well and  left the laboratory in satisfactory condition.   RESULTS:  The aortic pressure was 154/72 with a mean of 106.  Left  ventricular pressure was 154/18.   The Left Main Coronary Artery:  The left main coronary artery was free of  significant disease.   Left Anterior Descending:  The left anterior descending  artery gave rise to  2 diagonal branches and 3 septal perforators.  The left anterior descending  was irregular.  There was a 90% ostial stenosis in the first diagonal  branch.   Circumflex Artery:  The circumflex artery gives rise to a large intermediate  branch, an atrial branch, a small marginal branch, and a posterolateral  branch.  There was 40% narrowing in the intermediate branch.  There was 80%  narrowing in the marginal branch but this was a very small-caliber vessel.   Right Coronary Artery:  The right coronary had a 99% stenosis in his  proximal portion with TIMI-1 flow with just barely reaching the posterior  descending and posterolateral branches.  There were also 70% and 80% lesions  in the mid vessel and heavy calcification.  The vessel also filled via  collaterals from the left coronary system.   LEFT VENTRICULOGRAM:  The left ventriculogram was performed in the RAO  projection and showed good wall motion with no area of hypokinesis.  The  estimated ejection fraction was 60%.   Following rotational atherectomy and stenting of the lesion in the proximal  and mid coronary artery, stenosis improved from 99% to less than 10%, and  the flow improved from TIMI-1 to TIMI-3 flow.   CONCLUSIONS:  1. Coronary artery disease, status post multiple prior percutaneous coronary     interventions with irregularities in the left anterior descending, 90%     ostial stenosis in the first diagonal branch and left anterior     descending, 40% narrowing in the intermediate branch and the circumflex     artery, with 80% narrowing in a small marginal branch to the circumflex     artery, 99% proximal and 70% and 80% mid stenosis in the right coronary     artery with 50% narrowing in the distal vessel and 70% narrowing in the     posterior descending branch, and normal left ventricular function with an     ejection fraction of 60%. 2. Successful rotational atherectomy and placement of tandem  overlying Taxus     stent in the proximal and mid right coronary artery with improvement in     stent narrowing from 99% to less than 10%.   DISPOSITION:  The patient  returned to __________ for further observation.  His IVUS films will be sent to __________ if he will qualify for the CETP  study.                                               Charlies Constable, M.D.    BB/MEDQ  D:  04/13/2003  T:  04/14/2003  Job:  [id number]   cc:   Gregary Signs A. Everardo All, M.D. [facility]   Cardiopulmonary Lab

## 2011-05-11 NOTE — Cardiovascular Report (Signed)
NAME:  [name], [name]                         ACCOUNT NO.:  [id number]   MEDICAL RECORD NO.:  [id number]                   PATIENT TYPE:  OIB   LOCATION:  2858                                 FACILITY:  [facility]   PHYSICIAN:  Charlies Constable, M.D.                  DATE OF BIRTH:  [birth year]   DATE OF PROCEDURE:  11/16/2003  DATE OF DISCHARGE:                              CARDIAC CATHETERIZATION   CLINICAL HISTORY:  Mr. Alipio is 70 years old and has had multiple  percutaneous interventions in the LAD and right coronary artery in the 1980s  and 1990s and six months ago had rotational atherectomy and two long 24 mm  length Taxus stents placed in the right coronary artery.  He had a six month  follow-up Cardiolite scan which suggested inferior ischemia and was  scheduled for evaluation with angiography.  He also has had some congestive  heart failure related to diastolic dysfunction with preserved systolic  function.   PROCEDURE:  Right heart catheterization was performed percutaneously through  the right femoral artery using a medium sheath and Swan-Ganz thermodilution  catheter.  Left heart catheterization was performed percutaneously through  the right femoral artery using arterial sheath and 6-French preformed  coronary catheters.  A front wall arterial puncture was performed and  Omnipaque contrast was used.  Left ventricular pressures were measured but  no left ventriculogram was performed.  The patient tolerated the procedure  well and left the laboratory in satisfactory condition.   RESULTS:  Left main coronary artery:  Free of significant disease.   Left anterior descending artery:  Gave rise to two diagonal branches and  four septal perforators.  The LAD was irregular but there was no major  obstruction.  The LAD terminated before the apex.   Circumflex artery:  Gave rise to a large ramus branch, a marginal branch,  and a small posterolateral branch.  These vessels were  irregular but there  was no significant obstruction.   Right coronary artery:  Moderately large vessel.  Gave rise to a right  ventricular branch, a posterior descending branch, and two posterolateral  branches.  There were two 30% focal narrowings within the long overlapping  stents in the proximal and mid right coronary artery.  There was 40%  narrowing in the distal vessel before the posterior descending branch.  Distal vessel was diffusely diseased and irregular and there was 50%  narrowing in the posterior descending branch and 50% narrowing in the AV  branch after the posterior descending branch before the two posterolateral  branches.   No left ventriculogram was performed.   HEMODYNAMIC DATA:  Right atrial pressure was 12 mean.  The pulmonary artery  pressure was 36/19 with a mean of 27.  Pulmonary wedge pressure was 20 mean.  Left ventricular pressure was 133/18.  The aortic pressure was 133/71 with a  mean of 98.  Cardiac output/cardiac  index was 6.7/3.4 L/minute/sq m.   CONCLUSIONS:  Coronary artery disease status post multiple percutaneous  interventions as described above with luminal irregularities in the left  anterior descending and circumflex artery, 30% focal in-stent narrowing  within the stents in the proximal to mid right coronary artery, and 40 and  50% stenosis in the distal right coronary artery.   RECOMMENDATIONS:  The patient has not developed any significant restenosis  in the right coronary artery stents.  Will plan reassurance and continue  medical therapy and secondary risk factor modification.  Will arrange for  follow-up in our office in the next few weeks.                                               Charlies Constable, M.D.    BB/MEDQ  D:  11/16/2003  T:  11/16/2003  Job:  [id number]   cc:   Gregary Signs A. Everardo All, M.D. [facility]   CP Lab

## 2011-05-17 ENCOUNTER — Ambulatory Visit: Payer: Medicare Other | Admitting: Endocrinology

## 2011-05-22 ENCOUNTER — Ambulatory Visit: Payer: Medicare Other

## 2011-05-23 ENCOUNTER — Other Ambulatory Visit (INDEPENDENT_AMBULATORY_CARE_PROVIDER_SITE_OTHER): Payer: Self-pay | Admitting: Surgery

## 2011-05-23 ENCOUNTER — Encounter (HOSPITAL_COMMUNITY): Payer: Medicare Other

## 2011-05-23 LAB — COMPREHENSIVE METABOLIC PANEL
ALT: 19 U/L (ref 0–53)
Albumin: 3.9 g/dL (ref 3.5–5.2)
Alkaline Phosphatase: 101 U/L (ref 39–117)
Chloride: 90 mEq/L — ABNORMAL LOW (ref 96–112)
Glucose, Bld: 128 mg/dL — ABNORMAL HIGH (ref 70–99)
Potassium: 3.9 mEq/L (ref 3.5–5.1)
Sodium: 134 mEq/L — ABNORMAL LOW (ref 135–145)
Total Protein: 7.9 g/dL (ref 6.0–8.3)

## 2011-05-23 LAB — CBC
HCT: 40.2 % (ref 39.0–52.0)
RBC: 4.55 MIL/uL (ref 4.22–5.81)
RDW: 14.1 % (ref 11.5–15.5)
WBC: 13.9 10*3/uL — ABNORMAL HIGH (ref 4.0–10.5)

## 2011-05-23 LAB — DIFFERENTIAL
Basophils Absolute: 0 10*3/uL (ref 0.0–0.1)
Eosinophils Relative: 1 % (ref 0–5)
Lymphocytes Relative: 10 % — ABNORMAL LOW (ref 12–46)
Neutro Abs: 11.3 10*3/uL — ABNORMAL HIGH (ref 1.7–7.7)
Neutrophils Relative %: 81 % — ABNORMAL HIGH (ref 43–77)

## 2011-05-23 LAB — SURGICAL PCR SCREEN: Staphylococcus aureus: NEGATIVE

## 2011-05-27 NOTE — Discharge Summary (Signed)
[name], [name]               ACCOUNT NO.:  [id number]  MEDICAL RECORD NO.:  [id number]           PATIENT TYPE:  I  LOCATION:  2031                         FACILITY:  [facility]  PHYSICIAN:  Kathlen Mody, MD       DATE OF BIRTH:  12/09/1941  DATE OF ADMISSION:  05/07/2011 DATE OF DISCHARGE:  05/09/2011                              DISCHARGE SUMMARY   PRIMARY CARE PHYSICIAN:  Sean A. Everardo All, MD  CARDIOLOGIST:  Verne Carrow, MD  PULMONOLOGIST:  Barbaraann Share, MD, [facility]  DISCHARGE DIAGNOSES: 1. Syncope most likely secondary from choking on potassium pills. 2. Stage III chronic kidney disease. 3. Chronic hyponatremia. 4. Diastolic heart failure compensated mixed insulin-dependent     diabetes mellitus. 5. Hypertension. 6. Hyperlipidemia. 7. Chronic obstructive pulmonary disease. 8. Obesity. 9. Gastroesophageal reflux disease. 10.History of pulmonary embolism. 11.Paroxysmal atrial fibrillation. 12.History of gastrointestinal bleed.  DISCHARGE MEDICATIONS: 1. Albuterol inhaler 2 puffs as needed. 2. Allopurinol 300 mg 1 tablet daily. 3. Aspirin 81 mg 1 tablet daily. 4. Astepro 2 sprays as needed. 5. Bisoprolol 10 mg one tab daily. 6. Calcitriol 0.25 mcg one cap every morning. 7. Crestor 10 mg 1 tab at bedtime. 8. Diltiazem 180 mg daily. 9. Ferrous sulfate 325 mg twice daily. 10.Foltx (folic acid, pyridoxine, and cyanocobalamin) one tab daily. 11.Lasix 80 mg 1 tab in the morning and 1/2 tablet in the evening. 12.Hydrocodone/APAP 5/500 one tab every 4 hours as needed. 13.Isosorbide mononitrate 60 mg 1 tablet daily. 14.Metolazone 2.5 mg on Monday, Wednesday, and Friday, 3 days in a     week and once in the morning. 15.MiraLax daily as needed. 16.Nitroglycerin sublingual spray as needed up to 3 doses. 17.Novolin N 30 units daily at bedtime. 18.NovoLog t.i.d. 10-30 units. 19.Omeprazole 20 mg 1 tablet daily. 20.Potassium 20 mEq 2 tab twice daily. 21.Spiriva 18 mcg  1 capsule every morning. 22.Spironolactone 25 mg 1 tablet daily. 23.Symbicort 2 puffs inhalation twice daily. 24.Tamsulosin 0.4 mg daily at bedtime. 25.Trazodone 100 mg 1 tablet daily. 26.Triamcinolone topical cream three times a day as needed.  CONSULTS:  None.  PERTINENT LABS:  The day on admission, the patient had a CBC which showed a WBC count of 12.5.  The patient had D-dimers which was high as 0.84.  The patient had a sodium of 128, potassium of 4, creatinine of 2.70, and troponin was negative.  CK-MB was 5.4.  Repeat D-dimer was 0.59.  Basic metabolic panel showed an improvement in sodium to 131 and an improvement in creatinine to 1.68.  Cardiac panel showed a negative troponin, CK-MB of 4.5.  On day of discharge, the patient had a CBC showed a hemoglobin of 12.7, hematocrit of 30.7, WBC count of 8.2. Basic metabolic panel shows a creatinine of 2.07, BUN of 57, glucose of 195, magnesium of 2.4.  PERTINENT RADIOLOGY: 1. The patient had a chest x-ray on May 07, 2011, which showed mild     bibasilar atelectasis. 2. V/Q scan which showed a low likelihood ratio for PE.  Repeat chest     x-ray on May 08, 2011, showed stable cardiomegaly without pulmonary  edema.  No acute cardiopulmonary disease. 3. The patient had an echocardiogram which showed an EF of 45% to 50%     which was mildly reduced with hypokinesis with concomitant abnormal     relaxation and increased filling pressures and history of diastolic     heart failure.  Normal right ventricular size and right ventricular     systolic function.  BRIEF HOSPITAL COURSE: 1. This is a 70 year old gentleman with past medical history of     noncritical coronary artery disease, diastolic heart failure,     chronic kidney disease, PE and paroxysmal atrial fibrillation not     on Coumadin secondary to GI bleed.  I was admitted for syncope most     likely secondary to being choked on potassium pills.  He was     admitted to  telemetry to rule out ACS.  His cardiac enzymes and his     troponins were negative.  Initially his CK-MB was high.  His     telemetry was within normal limits.  He had a history of PE and D-     dimer was checked to see if he had recurrent pulmonary embolism.  D-     dimers came out high.  V/Q scan was obtained which showed a low     likelihood of PE.  CT angiogram could not be done as the patient     had chronic kidney disease with a creatinine of 2 from the     patient's history.  Most likely, the syncope could secondary to be     choking on potassium pills.  The patient also had an echocardiogram     which showed mild hypokinesis with an ejection fraction of 45% to     50%.  The patient had a recent stress test.  On February 2012 by     Dr. Shirlee Latch which was as per the patient which was a normal stress     test negative for any ischemia. 2. Stage III chronic kidney disease, stable at his baseline. 3. Leukocytosis was most likely reactive or discharge.  His CBC showed     normal WBC count of hyponatremia appears to be chronic. 4. Diastolic heart failure compensated.  No evidence of any pulmonary     edema or heart failure at this time.5. Hypertension controlled.  Continue with home medication. 6. COPD, stable.  Continue the Spiriva and albuterol as needed.  PHYSICAL EXAMINATION:  VITAL SIGNS:  On the day of discharge, the patient's vitals include temperature of 98.5, pulse of 61, respirations 18, blood pressure 127/50 and saturating 97% on 2 L of nasal cannula. GENERAL:  He is alert, afebrile, and oriented x3, comfortable in no acute distress. CARDIOVASCULAR:  S1 and S2 heard. RESPIRATORY:  Good air entry bilaterally.  No wheezing or rhonchi. ABDOMEN:  Obese, soft, nontender.  Bowel sounds are heard. EXTREMITIES:  Mild pedal edema.  The patient is hemodynamically stable for discharge.  He is recommended to follow up with Dr.  Clifton James in about 1 week to follow up with his normal  echocardiogram.          ______________________________ Kathlen Mody, MD     VA/MEDQ  D:  05/24/2011  T:  05/25/2011  Job:  [id number]  Electronically Signed by Kathlen Mody MD on 05/27/2011 08:24:56 AM

## 2011-05-29 ENCOUNTER — Inpatient Hospital Stay (HOSPITAL_COMMUNITY)
Admission: RE | Admit: 2011-05-29 | Discharge: 2011-05-30 | DRG: 621 | Disposition: A | Discharge status: Home / Self Care | Payer: Medicare Other | Source: Ambulatory Visit | Attending: Surgery | Admitting: Surgery

## 2011-05-29 DIAGNOSIS — I4891 Unspecified atrial fibrillation: Secondary | ICD-10-CM | POA: Diagnosis present

## 2011-05-29 DIAGNOSIS — I2510 Atherosclerotic heart disease of native coronary artery without angina pectoris: Secondary | ICD-10-CM | POA: Diagnosis present

## 2011-05-29 DIAGNOSIS — I129 Hypertensive chronic kidney disease with stage 1 through stage 4 chronic kidney disease, or unspecified chronic kidney disease: Secondary | ICD-10-CM | POA: Diagnosis present

## 2011-05-29 DIAGNOSIS — N400 Enlarged prostate without lower urinary tract symptoms: Secondary | ICD-10-CM | POA: Diagnosis present

## 2011-05-29 DIAGNOSIS — Z6841 Body Mass Index (BMI) 40.0 and over, adult: Secondary | ICD-10-CM

## 2011-05-29 DIAGNOSIS — Z79899 Other long term (current) drug therapy: Secondary | ICD-10-CM

## 2011-05-29 DIAGNOSIS — E119 Type 2 diabetes mellitus without complications: Secondary | ICD-10-CM | POA: Diagnosis present

## 2011-05-29 DIAGNOSIS — Z86718 Personal history of other venous thrombosis and embolism: Secondary | ICD-10-CM

## 2011-05-29 DIAGNOSIS — K219 Gastro-esophageal reflux disease without esophagitis: Secondary | ICD-10-CM | POA: Diagnosis present

## 2011-05-29 DIAGNOSIS — Z794 Long term (current) use of insulin: Secondary | ICD-10-CM

## 2011-05-29 DIAGNOSIS — N183 Chronic kidney disease, stage 3 unspecified: Secondary | ICD-10-CM | POA: Diagnosis present

## 2011-05-29 DIAGNOSIS — K449 Diaphragmatic hernia without obstruction or gangrene: Secondary | ICD-10-CM | POA: Diagnosis present

## 2011-05-29 DIAGNOSIS — E7800 Pure hypercholesterolemia, unspecified: Secondary | ICD-10-CM | POA: Diagnosis present

## 2011-05-29 DIAGNOSIS — J4489 Other specified chronic obstructive pulmonary disease: Secondary | ICD-10-CM | POA: Diagnosis present

## 2011-05-29 DIAGNOSIS — Z9861 Coronary angioplasty status: Secondary | ICD-10-CM

## 2011-05-29 DIAGNOSIS — I69998 Other sequelae following unspecified cerebrovascular disease: Secondary | ICD-10-CM

## 2011-05-29 DIAGNOSIS — I252 Old myocardial infarction: Secondary | ICD-10-CM

## 2011-05-29 DIAGNOSIS — J449 Chronic obstructive pulmonary disease, unspecified: Secondary | ICD-10-CM | POA: Diagnosis present

## 2011-05-29 DIAGNOSIS — R29898 Other symptoms and signs involving the musculoskeletal system: Secondary | ICD-10-CM | POA: Diagnosis present

## 2011-05-29 HISTORY — PX: LAPAROSCOPIC GASTRIC BANDING: SHX1100

## 2011-05-29 LAB — GLUCOSE, CAPILLARY
Glucose-Capillary: 267 mg/dL — ABNORMAL HIGH (ref 70–99)
Glucose-Capillary: 249 mg/dL — ABNORMAL HIGH (ref 70–99)

## 2011-05-30 ENCOUNTER — Inpatient Hospital Stay (HOSPITAL_COMMUNITY): Payer: Medicare Other

## 2011-05-30 LAB — DIFFERENTIAL
Eosinophils Relative: 2 % (ref 0–5)
Lymphocytes Relative: 13 % (ref 12–46)
Lymphs Abs: 1.2 10*3/uL (ref 0.7–4.0)
Monocytes Absolute: 1 10*3/uL (ref 0.1–1.0)

## 2011-05-30 LAB — CBC
HCT: 37.4 % — ABNORMAL LOW (ref 39.0–52.0)
MCV: 88 fL (ref 78.0–100.0)
RDW: 14.4 % (ref 11.5–15.5)
WBC: 9.3 10*3/uL (ref 4.0–10.5)

## 2011-05-30 LAB — GLUCOSE, CAPILLARY: Glucose-Capillary: 167 mg/dL — ABNORMAL HIGH (ref 70–99)

## 2011-05-30 NOTE — Op Note (Signed)
NAMECOLTRANE, TUGWELL NO.:  [id number]  MEDICAL RECORD NO.:  [id number]  LOCATION:  0009                         FACILITY:  [facility]  PHYSICIAN:  Sandria Bales. Ezzard Standing, M.D.  DATE OF BIRTH:  October 07, 1941  DATE OF PROCEDURE:  05/29/2011                              OPERATIVE REPORT   PREOPERATIVE DIAGNOSIS:  Morbid obesity with a weight of 244, body mass index of 43.4.  POSTOPERATIVE DIAGNOSIS:  Morbid obesity with a weight of 244, body mass index of 43.4.  PROCEDURE:  Laparoscopic banding with an AP large band.  SURGEON:  Sandria Bales. Ezzard Standing, MD  FIRST ASSISTANTSharlet Salina T. Hoxworth, MD  ANESTHESIA:  General endotracheal.  ESTIMATED BLOOD LOSS:  Minimal.  PROCEDURE:  Mr. Schamp is a 70 year old white male who is a patient of Dr. Romero Belling who has been through our bariatric program and is interested in undergoing a lap band.  Unfortunately about 3 weeks ago, he presented to the ER apparently after choking on what was thought to be a potassium tablet,  He was hospitalized for 2 days, evaluated by [facility], aahe did see Dr. Charlies Constable until his retirement, and now sees Dr. Clifton James, but nothing was found from a cardiac standpoint for this problem and now ready to proceed with surgery.  I discussed with him the indications, potential complications of lap band surgery.  Potential complications include, but are not limited to bleeding, infection, bowel injury, slippage or erosion of the band and long-term nutritional consequences.  He also is noted to have a small hiatal hernia on his upper GI and we will check to see how significant that is at the time of surgery and would fix it if need be.  OPERATIVE NOTE:  The patient was placed in the supine position, given general endotracheal anesthesia in room #11.  His abdomen was prepped with ChloraPrep and sterilely draped.  He was given 2 g of Ancef initially in procedure.    A time-out was held and  surgical checklist run.  I accessed the abdominal cavity through a left subclavian 11 mm Optiview port.  I then placed 4 additional trocars of 5 mm trocar in the subxiphoid location for liver retractor, a 15 mm trocar in the right subcostal for the band placement, an 11 mm right paramedian trocar for exposure, an 11 mm left paramedian for the camera and then a 5 mm left lateral trocar.  He had a prior infraumbilical hernia and he had adhesions of omentum up to this.  The adhesions were actually below my dissection line and I left these alone during the operation.  My exploration revealed that the liver  did not look too bad, he had  been on a preoperative diet for almost a month because of the delay in his surgery.  His stomach was somewhat distended intially and then decompressed.   The rest of all what I could see was omental fat.  The patient then placed in reverse Tredelenburg, then [facility] liver retractor placed at the left lobe of liver.  I first identified the left gastroesophageal junction at the angle of His.  I made a small incision in this area and  exposed the left crura.  I then went to the gastrohepatic ligament, opened this up, identified the right crura.  He had a lot of central fat.  He is pretty much in apple shape and this fat distribution is consistent with this physical finding.  Because of questionable hiatal hernia on upper GI, we placed the sizing balloon into the stomach and  insufflated to 15 cc, this was pulled back but did not pull through the hiatus.  So I felt that even though he may have a small hernia, it was not small enough to require repair at this time.  I then used an AP large band, passed the finger dissector around the gastroesophageal junction, pulled the Silastic tubing around and then locked this over the sizing tube and then removed the entire sizing tube.  I then imbricated 3 sutures using stomach below the band to over the band on the  left lateral side and these were 0 Ethibond sutures with tie knot limits on them.  This seated the band in a good location.  There was a little bit of oozing or low hematoma on the last suture of the stomach that stopped with a cinch down at the grommet.  I then took a photo, which I placed in the chart of the band placement.  The Silastic tube was then brought out through the right paramedian incision.  The Nathanson retractor was removed under direct visualization.  The trocars were removed.  There was no bleeding at any trocar site.  I attached the Silastic tubing to the reservoir, which had a mesh backing attached to it.  I made a subcutaneous port lateral to my incision and this is where I tucked in the reservoir.  I then closed the subcutaneous tissues with 2-0 Vicryl suture at the port site and the skin at each site was closed with 5-0 Vicryl suture and the wound was painted with Dermabond.  The patient tolerated the procedure well, was transported to recovery room in good condition.  Because of his multiple medical problems, I will keep him overnight for observation.  Sponge and needle counts were correct at end of the case.   Sandria Bales. Ezzard Standing, M.D., FACS   DHN/MEDQ  D:  05/29/2011  T:  05/29/2011  Job:  [id number]  cc:   Gregary Signs A. Everardo All, MD [address] [place] [address]  Verne Carrow, MD [address] [place] [address]  Barbaraann Share, MD,FCCP [address] [place] [address]  Courtney Paris, M.D. Fax: [phone]  Mindi Slicker. Lowell Guitar, M.D. Fax: [phone]  Electronically Signed by Ovidio Kin M.D. on 05/30/2011 05:14:28 PM

## 2011-05-31 NOTE — Discharge Summary (Signed)
[name], [name] NO.:  [id number]  MEDICAL RECORD NO.:  [id number]  LOCATION:  1540                         FACILITY:  [facility]  PHYSICIAN:  Sandria Bales. Ezzard Standing, M.D.  DATE OF BIRTH:  28-Mar-1941  DATE OF ADMISSION:  05/29/2011 DATE OF DISCHARGE:  05/30/2011                              DISCHARGE SUMMARY   DATE OF ADMISSION:  May 29, 2011.  DAY OF DISCHARGE:  May 30, 2011.  DISCHARGE DIAGNOSES: 1. Morbid obesity. 2. 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 3. Longstanding hypertension. 4. Hypercholesterolemia. 5. History of coronary artery disease with stent placement. 6. History of pulmonary embolism in 2006 after stent placement. 7. History of atrial fibrillation. 8. Chronic renal insufficiency with modestly elevated creatinine of     2.39. 9. Benign prostatic hypertrophy, followed by Dr. Vic Blackbird. 10.Insulin-dependent diabetes mellitus. 11.Recent syncopal episode, questionably secondary to getting choked     on a pill from which she has recovered.  OPERATIONS PERFORMED:  The patient had an AP large lap band by Dr. Ovidio Kin on May 29, 2011  HISTORY OF ILLNESS:  [name] is a 70 year old white male who is a patient Dr. Romero Belling.  He sees Dr. Earney Hamburg from a cardiology standpoint, Dr. Marcelyn Bruins from a lung standpoint, Dr. Casimiro Needle from a renal standpoint and Dr. Vic Blackbird from urology standpoint.  He has been overweight much of his adult life, has been through our preop bariatric program and was interested in doing a lap band as a weight control operation.  Unfortunately about 3 weeks ago he had a syncopal episode where he presented to [facility].  It was thought that he possibly choked on a potassium tablet.  Evaluation at that time was negative.  He has now completely recovered from that episode and comes to [facility] for lap band surgery.  His comorbid conditions  include: 1. History of stroke in 2007 which has left him with some left-sided     weakness. 2. Longstanding hypertension. 3. Hypercholesterolemia. 4. Coronary artery disease with stent placement, was followed by Dr.     Charlies Constable, now followed by Dr. Verne Carrow. 5. History of PE in 2006. 6. History of bleeding from gastric ulcer. 7. History of atrial fibrillation. 8. Chronic renal insufficiency, followed by Dr. Casimiro Needle 9. BPH, followed by Dr. Vic Blackbird. 10.Insulin dependent diabetes mellitus, followed by Dr. Romero Belling.  HOSPITAL COURSE:  The patient presented to the hospital on May 29, 2011. He underwent a laparoscopic placement of an AP large band.  On his preop evaluation, he was noticed to have a small hiatal hernia but at the time of surgery, the intraoperative balloon test that we perform did not reveal a significant hiatel hernia.  He is now 1 day postop.  His hemoglobin is 12.3, his white blood cell count is 9300.  His vital signs are good.  His blood sugar is 167.  He is to get a KUB for documentation of location of the band.  He is to started on some protein drinks; if this is tolerated, he should be ready to be discharged home.  DISCHARGE INSTRUCTIONS:  His discharge instructions will be: To resume his home medications.  His home medication list includes: 1. NovoLog insulin. 2. Trazodone 100 mg nightly. 3. Tamsulosin 0.4 mg nightly. 4. Symbicort 2 puffs b.i.d. 5. Spironolactone 25 mg daily. 6. Spiriva 18 mcg inhaled daily. 7. Potassium 20 mEq twice a day. 8. Omeprazole 20 mg every morning. 9. Nitroglycerin as a p.r.n. 10.MiraLax. 11.Metolazone on Monday, Wednesday, and Friday. 12.Isosorbide XR 60 mg daily. 13.Lasix 40 mg daily. 14.Diltiazem CD 180 mg every day. 15.Crestor 10 mg every day. 16.Calcitriol 0.25 mcg daily. 17.Bisoprolol 10 mg daily. 18.Allopurinol 300 mg daily. 19.Albuterol inhaler for shortness of breath.  He is  given Roxicet elixir for pain control.  He is to go home on his lap band diet which is protein drinks and supplemental liquids.  He has appointment to see me in 2 weeks, an appointment to see the nutritionist in 3 weeks and they will advance his diet.  He is to not do driving for 3 to 4 days.  He can shower.   His discharge condition is good.   Sandria Bales. Ezzard Standing, M.D., FACS   DHN/MEDQ  D:  05/30/2011  T:  05/30/2011  Job:  [id number]  cc:   Gregary Signs A. Everardo All, MD [address] [place] [address]  Verne Carrow, MD [address] [place] [address]  Barbaraann Share, MD,FCCP [address] [place] [address]  Mindi Slicker. Lowell Guitar, M.D. Fax: [phone]  Courtney Paris, M.D. Fax: [phone]  Electronically Signed by Ovidio Kin M.D. on 05/31/2011 07:27:35 AM

## 2011-06-11 ENCOUNTER — Ambulatory Visit: Payer: Medicare Other | Admitting: Endocrinology

## 2011-06-11 ENCOUNTER — Emergency Department (HOSPITAL_COMMUNITY): Payer: Medicare Other

## 2011-06-11 ENCOUNTER — Inpatient Hospital Stay (HOSPITAL_COMMUNITY)
Admission: EM | Admit: 2011-06-11 | Discharge: 2011-06-25 | DRG: 312 | Disposition: A | Discharge status: Skilled Nursing Facility | Payer: Medicare Other | Source: Ambulatory Visit | Attending: Internal Medicine | Admitting: Internal Medicine

## 2011-06-11 DIAGNOSIS — E119 Type 2 diabetes mellitus without complications: Secondary | ICD-10-CM | POA: Diagnosis present

## 2011-06-11 DIAGNOSIS — I951 Orthostatic hypotension: Principal | ICD-10-CM | POA: Diagnosis present

## 2011-06-11 DIAGNOSIS — E860 Dehydration: Secondary | ICD-10-CM | POA: Diagnosis present

## 2011-06-11 DIAGNOSIS — M79609 Pain in unspecified limb: Secondary | ICD-10-CM

## 2011-06-11 DIAGNOSIS — J4489 Other specified chronic obstructive pulmonary disease: Secondary | ICD-10-CM | POA: Diagnosis present

## 2011-06-11 DIAGNOSIS — Z9884 Bariatric surgery status: Secondary | ICD-10-CM

## 2011-06-11 DIAGNOSIS — N400 Enlarged prostate without lower urinary tract symptoms: Secondary | ICD-10-CM | POA: Diagnosis present

## 2011-06-11 DIAGNOSIS — J449 Chronic obstructive pulmonary disease, unspecified: Secondary | ICD-10-CM | POA: Diagnosis present

## 2011-06-11 DIAGNOSIS — I69998 Other sequelae following unspecified cerebrovascular disease: Secondary | ICD-10-CM

## 2011-06-11 DIAGNOSIS — I5042 Chronic combined systolic (congestive) and diastolic (congestive) heart failure: Secondary | ICD-10-CM | POA: Diagnosis present

## 2011-06-11 DIAGNOSIS — R55 Syncope and collapse: Secondary | ICD-10-CM

## 2011-06-11 DIAGNOSIS — Z9861 Coronary angioplasty status: Secondary | ICD-10-CM

## 2011-06-11 DIAGNOSIS — I2510 Atherosclerotic heart disease of native coronary artery without angina pectoris: Secondary | ICD-10-CM | POA: Diagnosis present

## 2011-06-11 DIAGNOSIS — I509 Heart failure, unspecified: Secondary | ICD-10-CM | POA: Diagnosis present

## 2011-06-11 DIAGNOSIS — N289 Disorder of kidney and ureter, unspecified: Secondary | ICD-10-CM | POA: Diagnosis present

## 2011-06-11 DIAGNOSIS — Z87891 Personal history of nicotine dependence: Secondary | ICD-10-CM

## 2011-06-11 DIAGNOSIS — R29898 Other symptoms and signs involving the musculoskeletal system: Secondary | ICD-10-CM | POA: Diagnosis present

## 2011-06-11 DIAGNOSIS — N2581 Secondary hyperparathyroidism of renal origin: Secondary | ICD-10-CM | POA: Diagnosis present

## 2011-06-11 DIAGNOSIS — Z86718 Personal history of other venous thrombosis and embolism: Secondary | ICD-10-CM

## 2011-06-11 DIAGNOSIS — S92309A Fracture of unspecified metatarsal bone(s), unspecified foot, initial encounter for closed fracture: Secondary | ICD-10-CM | POA: Diagnosis present

## 2011-06-11 DIAGNOSIS — I129 Hypertensive chronic kidney disease with stage 1 through stage 4 chronic kidney disease, or unspecified chronic kidney disease: Secondary | ICD-10-CM | POA: Diagnosis present

## 2011-06-11 DIAGNOSIS — R197 Diarrhea, unspecified: Secondary | ICD-10-CM | POA: Diagnosis present

## 2011-06-11 DIAGNOSIS — W19XXXA Unspecified fall, initial encounter: Secondary | ICD-10-CM | POA: Diagnosis present

## 2011-06-11 DIAGNOSIS — N183 Chronic kidney disease, stage 3 unspecified: Secondary | ICD-10-CM | POA: Diagnosis present

## 2011-06-11 LAB — BASIC METABOLIC PANEL
BUN: 62 mg/dL — ABNORMAL HIGH (ref 6–23)
CO2: 29 mEq/L (ref 19–32)
Calcium: 10.8 mg/dL — ABNORMAL HIGH (ref 8.4–10.5)
Creatinine, Ser: 2 mg/dL — ABNORMAL HIGH (ref 0.50–1.35)
Glucose, Bld: 176 mg/dL — ABNORMAL HIGH (ref 70–99)

## 2011-06-11 LAB — CARDIAC PANEL(CRET KIN+CKTOT+MB+TROPI)
CK, MB: 1.1 ng/mL (ref 0.3–4.0)
CK, MB: 1.1 ng/mL (ref 0.3–4.0)
Relative Index: INVALID (ref 0.0–2.5)
Troponin I: <0.3 ng/mL (ref <0.30)
Troponin I: <0.3 ng/mL (ref <0.30)

## 2011-06-11 LAB — GLUCOSE, CAPILLARY: Glucose-Capillary: 180 mg/dL — ABNORMAL HIGH (ref 70–99)

## 2011-06-11 LAB — CBC
HCT: 39.6 % (ref 39.0–52.0)
Hemoglobin: 13.6 g/dL (ref 13.0–17.0)
MCH: 29.7 pg (ref 26.0–34.0)
MCV: 86.5 fL (ref 78.0–100.0)
Platelets: 260 10*3/uL (ref 150–400)
RBC: 4.58 MIL/uL (ref 4.22–5.81)

## 2011-06-11 LAB — DIFFERENTIAL
Eosinophils Absolute: 0.3 10*3/uL (ref 0.0–0.7)
Lymphocytes Relative: 16 % (ref 12–46)
Lymphs Abs: 1.8 10*3/uL (ref 0.7–4.0)
Monocytes Relative: 6 % (ref 3–12)
Neutrophils Relative %: 75 % (ref 43–77)

## 2011-06-11 LAB — TROPONIN I: Troponin I: <0.3 ng/mL (ref <0.30)

## 2011-06-12 ENCOUNTER — Inpatient Hospital Stay (HOSPITAL_COMMUNITY): Payer: Medicare Other

## 2011-06-12 LAB — COMPREHENSIVE METABOLIC PANEL
ALT: 16 U/L (ref 0–53)
Calcium: 9.8 mg/dL (ref 8.4–10.5)
Creatinine, Ser: 1.56 mg/dL — ABNORMAL HIGH (ref 0.50–1.35)
GFR calc Af Amer: 54 mL/min — ABNORMAL LOW (ref >60)
GFR calc non Af Amer: 44 mL/min — ABNORMAL LOW (ref >60)
Glucose, Bld: 136 mg/dL — ABNORMAL HIGH (ref 70–99)
Sodium: 135 mEq/L (ref 135–145)
Total Protein: 6.3 g/dL (ref 6.0–8.3)

## 2011-06-12 LAB — GLUCOSE, CAPILLARY
Glucose-Capillary: 140 mg/dL — ABNORMAL HIGH (ref 70–99)
Glucose-Capillary: 137 mg/dL — ABNORMAL HIGH (ref 70–99)

## 2011-06-12 LAB — CBC
Hemoglobin: 12.4 g/dL — ABNORMAL LOW (ref 13.0–17.0)
MCH: 29.1 pg (ref 26.0–34.0)
MCHC: 33.2 g/dL (ref 30.0–36.0)
MCV: 87.6 fL (ref 78.0–100.0)
Platelets: 248 10*3/uL (ref 150–400)

## 2011-06-13 DIAGNOSIS — R55 Syncope and collapse: Secondary | ICD-10-CM

## 2011-06-13 DIAGNOSIS — I951 Orthostatic hypotension: Secondary | ICD-10-CM

## 2011-06-13 LAB — GLUCOSE, CAPILLARY
Glucose-Capillary: 166 mg/dL — ABNORMAL HIGH (ref 70–99)
Glucose-Capillary: 159 mg/dL — ABNORMAL HIGH (ref 70–99)
Glucose-Capillary: 163 mg/dL — ABNORMAL HIGH (ref 70–99)

## 2011-06-13 LAB — BASIC METABOLIC PANEL
CO2: 31 mEq/L (ref 19–32)
Glucose, Bld: 95 mg/dL (ref 70–99)
Potassium: 3.8 mEq/L (ref 3.5–5.1)
Sodium: 134 mEq/L — ABNORMAL LOW (ref 135–145)

## 2011-06-14 ENCOUNTER — Ambulatory Visit: Payer: Medicare Other | Admitting: Endocrinology

## 2011-06-14 LAB — BASIC METABOLIC PANEL
CO2: 30 mEq/L (ref 19–32)
Chloride: 95 mEq/L — ABNORMAL LOW (ref 96–112)
Potassium: 3.6 mEq/L (ref 3.5–5.1)
Sodium: 136 mEq/L (ref 135–145)

## 2011-06-14 LAB — GLUCOSE, CAPILLARY
Glucose-Capillary: 133 mg/dL — ABNORMAL HIGH (ref 70–99)
Glucose-Capillary: 172 mg/dL — ABNORMAL HIGH (ref 70–99)

## 2011-06-14 LAB — CBC
Platelets: 250 10*3/uL (ref 150–400)
RBC: 4.03 MIL/uL — ABNORMAL LOW (ref 4.22–5.81)
WBC: 6.7 10*3/uL (ref 4.0–10.5)

## 2011-06-15 DIAGNOSIS — Z01818 Encounter for other preprocedural examination: Secondary | ICD-10-CM | POA: Insufficient documentation

## 2011-06-15 DIAGNOSIS — Z713 Dietary counseling and surveillance: Secondary | ICD-10-CM | POA: Insufficient documentation

## 2011-06-15 DIAGNOSIS — I5033 Acute on chronic diastolic (congestive) heart failure: Secondary | ICD-10-CM

## 2011-06-15 LAB — GLUCOSE, CAPILLARY
Glucose-Capillary: 164 mg/dL — ABNORMAL HIGH (ref 70–99)
Glucose-Capillary: 170 mg/dL — ABNORMAL HIGH (ref 70–99)

## 2011-06-15 LAB — CBC
HCT: 36.2 % — ABNORMAL LOW (ref 39.0–52.0)
MCV: 88.1 fL (ref 78.0–100.0)
Platelets: 273 10*3/uL (ref 150–400)
RBC: 4.11 MIL/uL — ABNORMAL LOW (ref 4.22–5.81)
WBC: 6.5 10*3/uL (ref 4.0–10.5)

## 2011-06-15 LAB — BASIC METABOLIC PANEL
CO2: 28 mEq/L (ref 19–32)
Chloride: 97 mEq/L (ref 96–112)
Creatinine, Ser: 1.16 mg/dL (ref 0.50–1.35)

## 2011-06-15 LAB — CLOSTRIDIUM DIFFICILE BY PCR: Toxigenic C. Difficile by PCR: NEGATIVE

## 2011-06-15 LAB — POCT OCCULT BLOOD STOOL (DEVICE): Fecal Occult Bld: NEGATIVE

## 2011-06-16 ENCOUNTER — Inpatient Hospital Stay (HOSPITAL_COMMUNITY): Payer: Medicare Other

## 2011-06-16 DIAGNOSIS — I2510 Atherosclerotic heart disease of native coronary artery without angina pectoris: Secondary | ICD-10-CM

## 2011-06-16 LAB — BASIC METABOLIC PANEL
BUN: 19 mg/dL (ref 6–23)
CO2: 27 mEq/L (ref 19–32)
Chloride: 98 mEq/L (ref 96–112)
Creatinine, Ser: 1.11 mg/dL (ref 0.50–1.35)
GFR calc Af Amer: >60 mL/min (ref >60)
Glucose, Bld: 137 mg/dL — ABNORMAL HIGH (ref 70–99)

## 2011-06-16 LAB — GLUCOSE, CAPILLARY
Glucose-Capillary: 158 mg/dL — ABNORMAL HIGH (ref 70–99)
Glucose-Capillary: 188 mg/dL — ABNORMAL HIGH (ref 70–99)
Glucose-Capillary: 168 mg/dL — ABNORMAL HIGH (ref 70–99)

## 2011-06-17 LAB — GLUCOSE, CAPILLARY
Glucose-Capillary: 178 mg/dL — ABNORMAL HIGH (ref 70–99)
Glucose-Capillary: 180 mg/dL — ABNORMAL HIGH (ref 70–99)

## 2011-06-18 ENCOUNTER — Encounter (INDEPENDENT_AMBULATORY_CARE_PROVIDER_SITE_OTHER): Payer: Self-pay | Admitting: Surgery

## 2011-06-18 LAB — GLUCOSE, CAPILLARY
Glucose-Capillary: 150 mg/dL — ABNORMAL HIGH (ref 70–99)
Glucose-Capillary: 167 mg/dL — ABNORMAL HIGH (ref 70–99)

## 2011-06-18 LAB — BASIC METABOLIC PANEL
CO2: 29 mEq/L (ref 19–32)
Chloride: 95 mEq/L — ABNORMAL LOW (ref 96–112)
GFR calc Af Amer: >60 mL/min (ref >60)
Potassium: 3.9 mEq/L (ref 3.5–5.1)
Sodium: 132 mEq/L — ABNORMAL LOW (ref 135–145)

## 2011-06-19 ENCOUNTER — Inpatient Hospital Stay (HOSPITAL_COMMUNITY): Payer: Medicare Other

## 2011-06-19 LAB — BASIC METABOLIC PANEL
CO2: 29 mEq/L (ref 19–32)
Calcium: 9.5 mg/dL (ref 8.4–10.5)
Chloride: 96 mEq/L (ref 96–112)
Creatinine, Ser: 1.31 mg/dL (ref 0.50–1.35)
Glucose, Bld: 160 mg/dL — ABNORMAL HIGH (ref 70–99)

## 2011-06-19 LAB — GLUCOSE, CAPILLARY
Glucose-Capillary: 162 mg/dL — ABNORMAL HIGH (ref 70–99)
Glucose-Capillary: 179 mg/dL — ABNORMAL HIGH (ref 70–99)

## 2011-06-19 LAB — PRO B NATRIURETIC PEPTIDE: Pro B Natriuretic peptide (BNP): 4647 pg/mL — ABNORMAL HIGH (ref 0–125)

## 2011-06-20 ENCOUNTER — Encounter (INDEPENDENT_AMBULATORY_CARE_PROVIDER_SITE_OTHER): Payer: Medicare Other | Admitting: Surgery

## 2011-06-20 LAB — BASIC METABOLIC PANEL
CO2: 29 mEq/L (ref 19–32)
Chloride: 98 mEq/L (ref 96–112)
GFR calc Af Amer: >60 mL/min (ref >60)
Potassium: 3.8 mEq/L (ref 3.5–5.1)
Sodium: 137 mEq/L (ref 135–145)

## 2011-06-20 LAB — GLUCOSE, CAPILLARY
Glucose-Capillary: 168 mg/dL — ABNORMAL HIGH (ref 70–99)
Glucose-Capillary: 196 mg/dL — ABNORMAL HIGH (ref 70–99)
Glucose-Capillary: 180 mg/dL — ABNORMAL HIGH (ref 70–99)

## 2011-06-21 LAB — GLUCOSE, CAPILLARY
Glucose-Capillary: 156 mg/dL — ABNORMAL HIGH (ref 70–99)
Glucose-Capillary: 145 mg/dL — ABNORMAL HIGH (ref 70–99)

## 2011-06-22 LAB — GLUCOSE, CAPILLARY
Glucose-Capillary: 193 mg/dL — ABNORMAL HIGH (ref 70–99)
Glucose-Capillary: 189 mg/dL — ABNORMAL HIGH (ref 70–99)

## 2011-06-22 LAB — BASIC METABOLIC PANEL
CO2: 27 mEq/L (ref 19–32)
Calcium: 9.1 mg/dL (ref 8.4–10.5)
Creatinine, Ser: 1 mg/dL (ref 0.50–1.35)
GFR calc non Af Amer: >60 mL/min (ref >60)

## 2011-06-23 LAB — GLUCOSE, CAPILLARY
Glucose-Capillary: 148 mg/dL — ABNORMAL HIGH (ref 70–99)
Glucose-Capillary: 179 mg/dL — ABNORMAL HIGH (ref 70–99)
Glucose-Capillary: 147 mg/dL — ABNORMAL HIGH (ref 70–99)

## 2011-06-23 LAB — BASIC METABOLIC PANEL
BUN: 21 mg/dL (ref 6–23)
CO2: 27 mEq/L (ref 19–32)
Chloride: 99 mEq/L (ref 96–112)
GFR calc Af Amer: >60 mL/min (ref >60)
Potassium: 3.9 mEq/L (ref 3.5–5.1)

## 2011-06-24 LAB — BASIC METABOLIC PANEL
BUN: 26 mg/dL — ABNORMAL HIGH (ref 6–23)
Chloride: 99 mEq/L (ref 96–112)
Glucose, Bld: 143 mg/dL — ABNORMAL HIGH (ref 70–99)
Potassium: 3.7 mEq/L (ref 3.5–5.1)

## 2011-06-24 LAB — GLUCOSE, CAPILLARY: Glucose-Capillary: 174 mg/dL — ABNORMAL HIGH (ref 70–99)

## 2011-06-25 LAB — GLUCOSE, CAPILLARY: Glucose-Capillary: 164 mg/dL — ABNORMAL HIGH (ref 70–99)

## 2011-07-03 ENCOUNTER — Encounter (INDEPENDENT_AMBULATORY_CARE_PROVIDER_SITE_OTHER): Payer: Self-pay | Admitting: Surgery

## 2011-07-04 ENCOUNTER — Ambulatory Visit (INDEPENDENT_AMBULATORY_CARE_PROVIDER_SITE_OTHER): Payer: Medicare Other | Admitting: Surgery

## 2011-07-04 ENCOUNTER — Encounter (INDEPENDENT_AMBULATORY_CARE_PROVIDER_SITE_OTHER): Payer: Self-pay | Admitting: Surgery

## 2011-07-04 DIAGNOSIS — Z9884 Bariatric surgery status: Secondary | ICD-10-CM

## 2011-07-04 NOTE — Patient Instructions (Signed)
1.  Doing well from lap band.  2.  Will see you back in 6 to 8 weeks for evaluation of lap band.

## 2011-07-04 NOTE — Progress Notes (Signed)
The patient is a 70 year old white male who is a patient of Dr. Romero Belling. He sees Dr. Earney Hamburg from a cardiology standpoint. He sees Dr. Marcelyn Bruins from a pulmonary standpoint. He sees Dr. Casimiro Needle from a renal standpoint and Dr. Vic Blackbird from a urology standpoint.  He underwent placement of a lap band on May 29, 2011. He did well with the surgery and was discharged the day after surgery. Unfortunately since discharge, he had a syncopal episode at home and was admitted to [facility] on 11 June 2011. He was found to have broken bones in both feet. He was seen by Dr. Dorene Grebe from an orthopedic standpoint.  He comes today for his first postop visit after his lap band.  The patient is doing recovery at [facility]. The length of time he will be there is unknown.  In addition to having broken bones in both feet, he was also found to have a right ankle fracture and a right fibular fracture. Dr. Dorene Grebe is managing his orthopedic problems.  He is on Lovenox.  His diabetes is doing well.  He is a loss of weight. The weight in his chart is weight obtained at [facility].  Physical exam: General: He is sitting in a wheelchair with boots on both lower extremities. Lungs: Clear to auscultation. Heart: Regular rate and rhythm. Abdomen: His incisions are well-healed. The port for his lap band is in the right upper quadrant and easily palpable.  Assessment and plan:   #1. Status post placement lap band. Plan to see him back in 6-8 weeks for evaluation and possible lap band fill.  2. On Lovenox. #3. Insulin-dependent diabetes mellitus. Better. #4. Hypertension. #5. Coronary artery disease with stent placement. #6. Bilateral lower extremity fractures. #7. Remote history of pulmonary embolism. #8. Chronic renal insufficiency.

## 2011-07-05 NOTE — Progress Notes (Signed)
NAME:  [name], BEG NO.:  [id number]  MEDICAL RECORD NO.:  [id number]  LOCATION:  [facility]                          FACILITY:  [facility]  PHYSICIAN:  Osvaldo Shipper, MD     DATE OF BIRTH:  May 11, 1941                                PROGRESS NOTE   PRIMARY CARE PHYSICIAN: Sean A. Everardo All, MD  SURGEON: Dr. Ezzard Standing, who did the recent lab band procedure.  PULMONOLOGIST: Barbaraann Share, MD, FCCP  CARDIOLOGIST: Verne Carrow, MD  CONSULTATIONS DURING THIS HOSPITALIZATION: 1. Dr. Rollene Rotunda and Dr. Kristeen Miss, from [facility]. 2. Dr. Dorene Grebe, with Orthopedic Surgery.  IMAGING STUDIES DONE SO FAR: 1. Chest x-ray which showed no acute findings. 2. Right ankle film showed no acute findings. 3. CT head showed no acute intracranial abnormalities. 4. X-ray of the right ankle two-view film did not show any acute     abnormalities.  X-ray of the right foot showed nondisplaced     fracture involving the base of the second and third metatarsal     bones. 5. X-ray of the tibia fibula on the right without any acute     abnormalities. 6. X-ray of the left foot which showed acute fractures involving the     distal aspect of the fourth and fifth metatarsal bones. 7. X-ray of the chest on June 23 which showed stable findings without     any acute cardiopulmonary abnormalities.  OTHER STUDIES DONE: 1. Venous Doppler of the lower extremities which did not show any DVT,     no Baker cyst was noted. 2. He had a carotid Dopplers which did not show any significant     stenosis.  BRIEF HOSPITAL COURSE: 1. Syncope secondary to orthostatic hypotension.  He came in     complaining of dizziness and fall, apparently had passed out.  He     was evaluated in the hospital and was found to have orthostatic     hypotension.  The patient was initially thought to be dehydrated     because he was on multiple diuretics.  He was found to have an     elevated BUN,  creatinine of 62 and 2.  His diuretics were held.     The patient was given IV fluids cautiously because of his history     of diastolic heart failure.  Despite this he continued to remain     orthostatic.  Today, Dr. Elease Hashimoto, has started midodrine on him and     we will see how he responds to this treatment. 2. Metatarsal fractures on both sides, this is from his traumatic     fall.  He is having more pain on the right leg now compared to the     left.  He does not have any DVT based on venous Dopplers.  Since he     is having significant pain in the right lower extremity I am going     to proceed the CT scan of the right leg along with right foot to     see if there is anything else going on there.  He is getting PT/OT     evaluation  on a daily basis.  He will need skilled nursing facility     when he is discharged.  For the metatarsal fractures, he was seen     by Dr. Dorene Grebe, who made some nonoperative recommendations,     followup with him in his office. 3. History of diabetes.  He is on insulin which is stable. 4. History of hypertension.  We had to cut down his dose of Cardizem     because of his orthostatic hypotension.  His blood pressures are     running okay, please monitor this closely. 5. Chronic diastolic heart failure compensated, we are holding his     diuretics because of the orthostatic hypotension.  He is being now     without any difficulty breathing, and he is saturating well on room     air. 6. He has coronary artery disease which is stable. 7. He has BPH, he used to be on Flomax, because of orthostatic     hypotension we have discontinue it and we started finasteride.  He     is still having problems with urination I told him to give it     another day or 2 to see if it improves.  Today, the patient's main concern is about his inability to do anything because of significant pain in his legs and his feet.  Otherwise, he also has complaints about his difficult  urination.  Rest of his vital signs are all pretty stable except for orthostatic hypotension.  The blood pressure drops from systolic dropped from 136-94 when he stands diastolic goes from 72-60, heart rate went from 74-73, so he has significant orthostatic hypotension.  Lungs are clear to auscultation bilaterally with no wheezing, rales, or rhonchi.  Cardiovascular system is normal regular.  No S3, S4, rubs, murmurs or bruit.  Abdomen is soft, obese, and nondistended.  His the right leg is slightly more swollen than the left.  He has limited range of motion in the right ankle.  PLAN: As above.  We will see how he improves with midodrine.  We will follow up on the CT of the right lower extremity to see if there is any other issues apart from the fractures.  And then social worker has been asked to look for skilled nursing facility for rehab for him.  Discharge medications except for will be dictated at the time of discharge.     Osvaldo Shipper, MD     GK/MEDQ  D:  06/19/2011  T:  06/19/2011  Job:  [id number]  Electronically Signed by Osvaldo Shipper MD on 06/27/2011 08:00:17 PM

## 2011-07-05 NOTE — Consult Note (Signed)
NAME:  JASAI, SORG NO.:  [id number]  MEDICAL RECORD NO.:  [id number]  LOCATION:  [facility]                          FACILITY:  [facility]  PHYSICIAN:  Rollene Rotunda, MD, FACCDATE OF BIRTH:  [birth year]  DATE OF CONSULTATION:  06/13/2011 DATE OF DISCHARGE:                                CONSULTATION   PRIMARY CARDIOLOGIST:  Verne Carrow, MD  PRIMARY CARE PROVIDER:  Cleophas Dunker. Everardo All, MD  PULMONOLOGIST:  Barbaraann Share, MD, [facility]  PATIENT PROFILE:  A 70 year old male with history of coronary artery disease as well as stage III chronic kidney disease, morbid obesity, status post recent lap band procedure, presented with syncope and orthostasis. 1. Syncope. 2. Orthostatic hypotension. 3. Dehydration. 4. Stage III chronic kidney disease. 5. Coronary artery disease.     a.     Status post multiple right coronary artery PCI in the 1990s.     b.     April 13, 2003, PCI and rotational atherectomy of the right      coronary artery with placement of 2 Taxus drug-eluting stents.     c.     May 23, 2007, cardiac catheterization, left main normal.      LAD minor irregularities.  D1 with 70% ostial.  Left circumflex      normal.  Ramus intermedius 40%.  RCA less than 10% in-stent      restenosis, 40% distal stenosis.  PDA 70%.     d.     Adenosine Myoview February 2012, EF 61%, no ischemia.      Question inferior attenuation. 6. Hypertension. 7. Chronic diastolic congestive heart failure.     a.     May 08, 2011, two-D echocardiogram, EF 45-50% with mild      global hypokinesis and grade 2 diastolic dysfunction.  Trivial      mitral regurgitation.  Mildly to moderately dilated left atrium.      Normal RV function. 8. Hyperlipidemia. 9. Diabetes mellitus. 10.Gout. 11.Osteoporosis. 12.Secondary hyperparathyroidism. 13.History of CVA with left hemiplegia in 2005. 14.COPD. 15.Erectile dysfunction. 16.Allergic rhinitis. 17.Obesity.     a.     Status post lap band  procedure May 30, 2011. 18.History of syncope in the setting of choking on a potassium tablet     on May 25, 2011. 19.History of GI bleed. 20.History of pulmonary embolus in 2006. 21.History of basal cell carcinoma, status post excision August 2005.  ALLERGIES: 1. ACE INHIBITORS. 2. IV CONTRAST.  HISTORY OF PRESENT ILLNESS:  A 70 year old male with the above complex problem list.  The patient is status post lap band on May 30, 2011. Since surgery, the patient has been on a liquid diet and has lost approximately 8 kg.  He is on spirolactone 25 mg daily as well as metolazone Monday, Wednesday, Friday, and Lasix 40 mg daily. Postoperatively, the patient began to note intermittent dizziness and orthostasis.  On June 11, 2011, he got up at night to check on his grandson and while standing became acutely lightheaded followed by a syncopal spell and slumped to the floor.  He was immediately attended to by family and EMS was called.  The patient regained consciousness within a  minute and upon EMS arrival, they tried to sit him up and apparently he lost consciousness again, for less than a minute.  He was taken into the [facility] ED and subsequently admitted.  On admission, he was found to have a BUN of 62 with a creatinine of 2.0.  his diuretics were held and he was gently hydrated with improvement in BUN and creatinine to 29 and 1.37.  He was found to have bilateral foot fractures and has significant pain with standing at this point.  Unfortunately, he remains orthostatic dropping his pressure from 138/64 while lying to 95/59 while sitting up. He is unable to stand secondary to foot pain at this point.  We have been asked to evaluate.  CURRENT MEDICATIONS: 1. Allopurinol 300 mg daily. 2. Zebeta 10 mg daily. 3. Symbicort 2 puffs b.i.d. 4. Calcitriol 0.25 mg daily. 5. Diltiazem 180 mg daily. 6. Enoxaparin 40 mg daily. 7 . Sliding scale insulin. 1. NPH 12 units nightly. 2. Imdur 60 mg  daily. 3. Protonix 40 mg daily. 4. Crestor 10 mg q.p.m. 5. Flomax 0.4 mg nightly. 6. Spiriva 18 mcg daily. 7. Trazodone 100 mg nightly.  His Lasix, Zaroxolyn, and spirolactone are on hold.  FAMILY HISTORY:  Mother died of lung and colon cancer at 68.  Father died of old age at 34.  SOCIAL HISTORY:  The patient lives in [place] with his wife and 78- year-old grandson.  He is retired from [employer].  He has about 100-pack-year history of tobacco abuse, smoking multiple packs per day for 50 years. He quit in 2000.  He occasionally has an alcoholic beverage.  Denies drug use.  Does not routinely exercise.  REVIEW OF SYSTEMS:  Positive for presyncope and syncope as outlined in the HPI.  He also has had weakness and bilateral foot pain.  He says that postoperatively, he has been drinking 50 and 80 ounces of liquid per day.  He is a diabetic.  He is a full code.  Otherwise, all systems reviewed and negative.  PHYSICAL EXAMINATION:  VITAL SIGNS:  Temperature 97.6, heart rate 59, respirations 20, blood pressure 120/68, pulse ox 99% on 2 liters, weight is 98.1 kg, blood pressure lying 138/64 with a heart rate of 63, blood pressure sitting 95/59 with a heart rate of 69.  The patient unable tostand. GENERAL:  Pleasant white male in no acute distress, awake, alert and oriented x3.  He has a normal affect. HEENT:  Normal. NEUROLOGIC:  Grossly intact, nonfocal. SKIN:  Warm and dry without lesions or masses. NECK:  Supple without bruits, JVD. LUNGS:  Respirations are regular and unlabored.  Clear to auscultation. CARDIAC:  Regular S1 and S2.  No S3, S4, or murmurs. ABDOMEN:  Obese, firm, nontender.  Bowel sounds present x4. EXTREMITIES:  Warm, dry, pink.  No clubbing, cyanosis or edema.  Distal pulses are 1+ and equal bilaterally.  The patient does complain of bilateral foot pain upon palpation.  Chest x-ray on June 11, 2011, shows no acute findings.  Head CT showed no acute change.  Chronic  microvascular white matter ischemic demyelinization.  The patient had right foot ankle films June 11 2011, which were read out as no fractures.  On June 12, 2011, he had left foot and tib-fib films showing nondisplaced fractures at the base of the second and third metatarsals.  Tib-fib films were okay.  Left foot films performed on June 12, 2011, showed fourth and fifth metatarsal fractures.  LAB WORK:  Hemoglobin 12.4, hematocrit  37.3, WBC 6.9, platelet 248. Sodium 134, potassium 3.8, chloride 93, CO2 of 31, BUN 29, creatinine 1.37, glucose 95, total bilirubin 0.5, alkaline phosphatase 80, AST 13, ALT 16, total protein 6.3, albumin 3.2.  Cardiac markers have been negative x3.  Calcium 10.2.  Fecal occult blood was negative.  ASSESSMENT AND PLAN: 1. Syncope/orthostatic hypotension.  The patient likely dehydrated in     the setting of recent lap band procedure with liquid diet and     ongoing diuretic therapy.  Agree with holding diuretics.  Renal     function looks better.  The patient is still orthostatic by vitals.     We will give another liter of fluid today.  Continue strict I's and     O's and daily weights.  We will follow. 2. Chronic diastolic heart failure.  The patient is euvolemic.  Hold     diuretics as above. 3. Hypertension.  See #1. 4. Diabetes mellitus, per primary team. 5. Stage III chronic kidney disease.  Renal function improved with     holding of diuretics and fluid.  Continue to follow.     Nicolasa Ducking, ANP   ______________________________ Rollene Rotunda, MD, [facility]    CB/MEDQ  D:  06/13/2011  T:  06/14/2011  Job:  [id number]  Electronically Signed by Nicolasa Ducking ANP on 06/15/2011 02:36:37 PM Electronically Signed by Rollene Rotunda MD [facility] on 06/25/2011 02:29:10 PM

## 2011-07-06 ENCOUNTER — Other Ambulatory Visit: Payer: Self-pay | Admitting: Endocrinology

## 2011-07-06 NOTE — Consult Note (Signed)
  NAME:  [name], PINE NO.:  [id number]  MEDICAL RECORD NO.:  [id number]  LOCATION:                                 FACILITY:  PHYSICIAN:  Burnard Bunting, M.D.    DATE OF BIRTH:  [birth year]  DATE OF CONSULTATION:  06/14/2011 DATE OF DISCHARGE:                                CONSULTATION   Consultation is requested by Dr. Rito Ehrlich.  CHIEF COMPLAINT:  Bilateral foot pain.  HISTORY OF PRESENT ILLNESS:  [name] is a 70 year old patient who is currently admitted for workup of syncope.  He had a fall and has bilateral foot pain.  He denies any new numbness or tingling in his foot.  Denies any other orthopedic complaints.  He states the pain is mild, but he does have difficulty weightbearing and reports the swelling in the feet since a syncopal episode.  PAST MEDICAL HISTORY:  Notable for, 1. Stage III chronic kidney disease. 2. Morbid obesity. 3. Recent left hand procedure. 4. Hyperlipidemia. 5. Diabetes. 6. Gout. 7. Osteoporosis. 8. Secondary hyperparathyroidism. 9. History of CVA with left hemiplegia in 2005. 10.COPD.  ALLERGIES:  ACE INHIBITORS and IV CONTRAST.  CURRENT MEDICATIONS:  Allopurinol, Zometa, Symbicort, Calcitriol, diltiazem, Lovenox, sliding scale insulin, Imdur, Protonix, Crestor, Flomax, trazodone.  Mother died of lung and colon cancer at 4.  Father died at age 57.  The patient lives in [place] with his wife and grandson.  They are actually in the room with this evaluation.  Occasionally drinks.  Does not smoke.  All other systems reviewed and otherwise negative except the feet.  PHYSICAL EXAMINATION:  Well-developed, well-nourished, no acute distress, alert and oriented.  Blood pressure 120/70, respirations 18, heart rate is 60.  He is alert and conversant.  Has no knee effusions bilaterally.  Ankle range of motion is full bilaterally.  Palpably intact.  Nontender posterior peroneal and Achilles tendons  bilaterally. Does have pain with pronation, supination 4 feet.  No lesser toe deformities present.  Pedal pulses palpable.  Mild neuropathy is present dorsal and plantar aspect of the toes, but there is no ulcerations or lesions noted in the foot region.  Radiographs of both feet show left fourth and five metatarsal shaft fractures and right 2 and 2 metatarsal shaft fractures.  No evidence of tarsometatarsal injury.  IMPRESSION:  Bilateral metatarsal fractures x2.  PLAN:  Face wrap plus cast shoe.  Weightbearing as tolerated on the heel bilaterally.  Follow up with me in 3 weeks.  I think this should be a self-limited problem.  His mobility could be limited by pain, but it is unlikely that in the cast shoe that these fractures will significantly displace.  I will see him back at that time.     Burnard Bunting, M.D.     GSD/MEDQ  D:  06/15/2011  T:  06/15/2011  Job:  [id number]  Electronically Signed by Reece Agar.  Io Dieujuste M.D. on 07/06/2011 05:38:33 PM

## 2011-07-09 ENCOUNTER — Telehealth: Payer: Self-pay | Admitting: *Deleted

## 2011-07-09 NOTE — Telephone Encounter (Signed)
[name] called [facility] on 07/05/11 @ 3:28 pm and left a message regarding is progress after LAGB surgery. Upon returning his call, pt inform this Clinical research associate that he had been hospitalized for many days s/p surgery and is now in a rehab facility, [name], which was where his call was returned. Pt states he has lost down to 216 lbs and is doing well with his diet. He reports that he saw Dr. Ezzard Standing and hopes to have his first lap band fill in the following weeks. He states that he will call [name] when he goes home from the rehab facility to set up his follow up appointment.

## 2011-07-10 ENCOUNTER — Ambulatory Visit (INDEPENDENT_AMBULATORY_CARE_PROVIDER_SITE_OTHER): Payer: Medicare Other | Admitting: Endocrinology

## 2011-07-10 ENCOUNTER — Encounter: Payer: Self-pay | Admitting: Endocrinology

## 2011-07-10 DIAGNOSIS — E109 Type 1 diabetes mellitus without complications: Secondary | ICD-10-CM

## 2011-07-10 NOTE — Patient Instructions (Addendum)
Reduce novolog to 5 units tid (just before each meal). Stop the bedtime nph insulin. Reduce bisoprolol to 5 mg daily Please make a follow-up appointment in 1 month. Continue the same crestor for now.

## 2011-07-10 NOTE — Progress Notes (Signed)
Subjective:    Patient ID: [name], male    DOB: 1941/09/01, 70 y.o.   MRN: [id number]  HPI Pt had "lab-band" surgery 7 weeks ago.  h has lost 29 lbs so far.   He now takes a total of approx 20-30 units of insulin per day.  he brings a record of his cbg's which i have reviewed today.  It varie from 97-188, with no trend throughout the day.   He has a few weeks of slight dizziness in the head, but no assoc loc.  He fell, fracturing both legs.  He is now in a rehab facility. Past Medical History  Diagnosis Date  . Allergic rhinitis   . COPD (chronic obstructive pulmonary disease)     mild to moderate by pfts in 2006  . HLD (hyperlipidemia)   . Impotence   . PVD (peripheral vascular disease)   . Type II or unspecified type diabetes mellitus with renal manifestations, not stated as uncontrolled   . Renal insufficiency   . Osteoarthritis   . Cough     due to Zestril  . Encounter for long-term (current) use of aspirin   . Encounter for long-term (current) use of antiplatelets/antithrombotics   . Essential hypertension, benign   . Chronic diastolic heart failure   . Coronary atherosclerosis of native coronary artery   . Shortness of breath   . Other emphysema   . Edema   . Special screening for malignant neoplasm of prostate   . Conjunctivitis unspecified   . Hypopotassemia   . Osteoporosis, unspecified   . Secondary hyperparathyroidism (of renal origin)   . Gout, unspecified   . Pneumonia, organism unspecified   . 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 Hemiplegia affecting unspecified side, late effect of cerebrovascular disease   . Headache   . Other and unspecified hyperlipidemia   . Nephropathy, diabetic     Past Surgical History  Procedure Date  . Nasal sinus surgery   . Esophagogastroduodenoscopy   . Cardiac catheterization 01/18/2005  . Abdominal surgery     History   Social History  . Marital  Status: Married    Spouse Name: N/A    Number of Children: Y  . Years of Education: N/A   Occupational History  .  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     Social History Main Topics  . Smoking status: Former Smoker -- 2.0 packs/day for 41 years    Types: Cigarettes    Quit date: 12/24/1997  . Smokeless tobacco: Former Neurosurgeon  . Alcohol Use: No  . Drug Use: No  . Sexually Active: Not on file   Other Topics Concern  . Not on file   Social History Narrative  . No narrative on file    Current Outpatient Prescriptions on File Prior to Visit  Medication Sig Dispense Refill  . albuterol (VENTOLIN HFA) 108 (90 BASE) MCG/ACT inhaler Inhale 2 puffs into the lungs every 6 (six) hours as needed.        Marland Kitchen allopurinol (ZYLOPRIM) 300 MG tablet Take 300 mg by mouth daily.        Marland Kitchen aspirin 81 MG tablet Take 81 mg by mouth daily.        . Azelastine HCl (ASTEPRO) 0.15 % SOLN 2 sprays by Nasal route 2 (two) times daily as needed.        . calcitRIOL (ROCALTROL) 0.25 MCG capsule Take 0.25 mcg  by mouth daily.        . chlorpheniramine (WAL-FINATE) 4 MG tablet Take 8 mg by mouth daily.        Marland Kitchen diltiazem (TIAZAC) 180 MG 24 hr capsule Take 180 mg by mouth daily.        . ferrous sulfate 325 (65 FE) MG tablet Take 325 mg by mouth 3 (three) times daily.        . folic acid-pyridoxine-cyancobalamin (FOLBIC) 2.5-25-2 MG TABS Take 1 tablet by mouth daily.        Marland Kitchen glucose blood test strip Four times a day, variable glucoses dx 250.03      . HYDROcodone-acetaminophen (VICODIN) 5-500 MG per tablet Take 1-2 tablets by mouth every 4 (four) hours as needed.        . insulin aspart (NOVOLOG) 100 UNIT/ML injection Inject 5 Units into the skin 3 (three) times daily before meals.       . INSULIN SYRINGE 1CC/29G (B-D INSULIN SYRINGE) 29G X 1/2" 1 ML MISC 4 (four) times daily. Dx 250.03      . isosorbide mononitrate (IMDUR) 60 MG 24 hr tablet Take 1 tablet (60 mg total) by mouth daily.  30 tablet  3  . nitroGLYCERIN (NITROLINGUAL)  0.4 MG/SPRAY spray Place 1 spray under the tongue as directed.        Marland Kitchen omeprazole (PRILOSEC) 20 MG capsule Take 20 mg by mouth daily.        . polyethylene glycol powder (MIRALAX) powder Take 17 g by mouth as needed.        . rosuvastatin (CRESTOR) 10 MG tablet Take 10 mg by mouth daily.        Marland Kitchen tiotropium (SPIRIVA) 18 MCG inhalation capsule Place 18 mcg into inhaler and inhale daily.       . traZODone (DESYREL) 100 MG tablet TAKE 1 TABLET BY MOUTH EVERY NIGHT AT BEDTIME  90 tablet  0  . triamcinolone (KENALOG) 0.025 % cream APPLY THREE TIMES DAILY ON AFFECTED AREA AS NEEDED  80 g  0    Allergies  Allergen Reactions  . Enalapril Maleate     REACTION: cough  . Shellfish-Derived Products Swelling    Family History  Problem Relation Age of Onset  . Hypertension      family history of htn  . Heart disease Maternal Aunt   . Lung cancer Mother   . Cancer Mother     Lung Cancer  . Heart disease Father     CHF    BP 122/64  Pulse 59  Temp(Src) 98.1 F (36.7 C) (Oral)  Wt 215 lb (97.523 kg)  SpO2 97%    Review of Systems Denies brbpr and headache.    Objective:   Physical Exam GENERAL: no distress.  Obese.  In wheelchair.   Legs: both are in boots.  There is 1+ right edema, and trace on the left.      outside test results are reviewed: A1c=7.1   Assessment & Plan:  Weight loss, due to bariatric surgery Dm, with improved control due to the weight loss Htn, apparently overcontrolled Fall, ? Due to the overcontrol of htn

## 2011-07-12 NOTE — Discharge Summary (Signed)
[name], [name] NO.:  [id number]  MEDICAL RECORD NO.:  [id number]  LOCATION:  4714                         FACILITY:  [facility]  PHYSICIAN:  Calvert Cantor, M.D.     DATE OF BIRTH:  1941/01/28  DATE OF ADMISSION:  06/11/2011 DATE OF DISCHARGE:  06/25/2011                              DISCHARGE SUMMARY   PRIMARY CARE PHYSICIAN:  Sean A. Everardo All, MD  PRIMARY SURGEON:  Sandria Bales. Ezzard Standing, MD  PULMONOLOGIST:  Barbaraann Share, MD, [facility]  CARDIOLOGIST:  Verne Carrow, MD.  CONSULTS DURING HOSPITAL STAY: 1. Rollene Rotunda, MD, [facility] with Cardiology. 2. Burnard Bunting, MD with Orthopedic Surgery.  PRESENTING COMPLAINT:  Passed out.  DISCHARGE DIAGNOSES: 1. Syncope, likely secondary to dehydration plus orthostatic     hypotension. 2. Multiple fractures in feet and leg. 3. Morbid obesity status post recent lap-band procedure.  PAST MEDICAL HISTORY: 1. Diabetes mellitus, controlled. 2. Hypertension. 3. Chronic diastolic heart failure grade 2 and systolic heart failure     with EF of 40% to 45%. 4. Coronary artery disease. 5. BPH. 6. CVA in the past. 7. Gout. 8. Osteoporosis. 9. COPD. 10.Erectile dysfunction. 11.Allergic rhinitis. 12.History GI bleed in the past. 13.Pulmonary embolism in 2006. 14.Basal cell carcinoma status post excision in 2005. 15.Osteoporosis.  DISCHARGE MEDICATIONS: 1. Lovenox 40 mg subcutaneous daily until the patient is able to     better ambulate. 2. Finasteride 5 mg daily in place of Flomax which was held. 3. Midodrine 5 mg by mouth t.i.d. for orthostatic hypotension. 4. Aspirin 81 mg daily.  The patient had been taking this, but this     was held for his lap-band procedure and now he is restarted. 5. Polyethylene glycol powder 17 g daily as needed for constipation. 6. Senna 2 tablets by mouth daily as needed for constipation.  Continue the following: 1. Vicodin 5/500 one to two tablets q.4h. for pain. 2. Novolin N has  been decreased from 15 to 12 units daily at bedtime. 3. Albuterol inhaler 2 puffs every 6 hours as needed for wheezing. 4. Allopurinol 300 mg daily. 5. Astelin spray 0.15% two sprays in each nostril twice a day as     needed. 6. Bisoprolol 1 tablet daily. 7. Calcitriol 0.25 mg daily. 8. Crestor 10 mg daily. 9. Diltiazem CD 180 mg daily. 10.Ferrous sulfate 325 mg twice a day. 11.Foltx 2.5/25/2, one tablet daily. 12.Isosorbide mononitrate XR 60 mg daily. 13.MiraLax 17 g daily as needed. 14.Multivitamin OTC once a day. 15.Nitroglycerin translingual spray one spray every 5 minutes as     needed up to three doses for chest pain. 16.NovoLog 15 units with breakfast, 8 with lunch, 15 with dinner. 17.Omeprazole 20 mg daily. 18.Spiriva 18 mcg daily. 19.Symbicort 160/4.5 two puffs twice a day. 20.Solgar solution.  The patient has been using his own, this is 30 mL     daily. 21.Trazodone 100 mg daily at bedtime. 22.Triamcinolone topical cream 0.025% one application three times a     day as needed.  Stop the following medications: 1. Metolazone 2.5 mg Monday, Wednesday, Friday. 2. Potassium chloride 20 mEq 2 tablets twice a day. 3. Spironolactone 25 mg daily. 4.  Tamsulosin 0.4 mg daily at bedtime. 5. Furosemide 80 mg half to one tablet twice a day.  DIET ON DISCHARGE:  The patient is on a heart-healthy diabetic, bariatric diet.  PROCEDURES: 1. Chest x-ray two-view on June 11, 2011, no acute findings.  There is     cardiomegaly and chest is hyperexpanded. 2. Right ankle complete on June 11, 2011, no acute findings. 3. CT of head without contrast on June 11, 2011, no acute finding.     There is chronic microvascular disease. 4. Right ankle two-view on June 12, 2011, revealed mild soft tissue     swelling.  No fractures. 5. Right foot complete on June 12, 2011, revealed nondisplaced     fractures involving the base of the second and third metatarsal     bones. 6. X-ray two-view of  fibula on the right on June 12, 2011, no acute     abnormalities. 7. Left foot complete on June 12, 2011, acute fractures involving the     distal aspect of the fourth and fifth metatarsal bones. 8. Chest x-ray portable, one-view on June 16, 2011, stable without any     acute cardiopulmonary abnormalities. 9. CT of the femur right-sided on June 19, 2011, revealed mildly     displaced intra-articular fracture of the fibular head and     nondisplaced fracture of the lateral malleolus anteriorly. 10.CT of the right foot without contrast revealed nondisplaced extra-     articular fractures involving the bases of the second, third, and     fourth metatarsals. 11.CT of the right thigh without contrast on June 19, 2011, revealed     mild degenerative changes in the right hip and mild lateral soft     tissue edema. 12.Carotid Doppler results from June 11, 2011, revealed no significant     extracranial carotid artery stenosis.  Vertebrals are patent with     antegrade flow. 13.Doppler lower extremity on June 11, 2011, reveals no evidence of     DVT or superficial thrombosis.  No evidence of Baker cyst.  There     appears to be no flow in the right popliteal artery, however, this     could be due to the patient's body habitus versus occlusion.  HOSPITAL COURSE: 1. Syncope.  This was further worked up with orthostatic vitals, 2-D     echo, CT head, and carotid Doppler.  The patient's echo revealed     chronic diastolic and mild systolic dysfunction.  Carotid Doppler     were negative and CT of head was negative.  It was suspected that     his syncope was related to orthostatic hypotension and dehydration.     We held his diuretics and hydrated him quite a bit.  However,     orthostatic vitals would remain positive.  Eventually after     adequate hydration, midodrine was initiated at 2.5 mg t.i.d. and     titrated up to 5 mg t.i.d.  At this point, he is complaining of     some mild dizziness  when he stands.  We have asked him to stand for     3 minutes each time, which helps the orthostatic.  Despite     orthostatics being positive, dizziness is categorized only as mild.  For now, I have not further increased his midodrine.  I did hydrate him a little further, but did not help his orthostatic vitals.  We will continue to hold his diuretics as despite  being held for 2 weeks and being hydrated aggressively, the patient has not developed any signs of heart failure.  It may be that at this point he does not need diuretics anymore, especially as his diet has changed and he is getting reduced amount of liquids.  1. Multiple fractures.  The patient is noted to have bilateral     metatarsal fractures and right-sided fibular head fracture and     lateral malleolus fracture.  This has been extremely debilitating     for him.  The patient is barely able to stand for a few minutes.     He has been given custom-made orthopedic boots and will require     aggressive physical therapy.  He is being transferred to a rehab     facility.  Dr. August Saucer did evaluate him during hospital stay and     recommends followup in 3 weeks.  1. Morbid obesity status post lap-band.  The patient has steadily lost     some weight after his lap-band procedure.  He is very compliant     with his bariatric diet.  He will need further nutritionist eval     while at the rehab facility to ensure that he gets the appropriate     diet.  1. BPH.  His Flomax was held due to side effect of dizziness.  He has     been initiated on Proscar.  If his dizziness does improve while at     rehab, Flomax can be restarted.  All other medical problems remained essentially stable.  PHYSICAL EXAM:  LUNGS:  Clear bilaterally.  Good respiratory effort.  No use of accessory muscles. HEART:  Regular rate and rhythm.  No murmurs. ABDOMEN:  Soft, nontender, nondistended.  Bowel sounds positive. EXTREMITIES:  The patient did have  significant edema when he entered the hospital, but this has completely resolved.  Pedal pulses are positive and equal bilaterally.  There is no cyanosis or clubbing.  There is still tenderness present on the dorsum and plantar aspect of both feet and around his ankle.  CONDITION ON DISCHARGE:  Stable.  FOLLOWUP INSTRUCTIONS: 1. He needs to follow up with Dr. August Saucer in 3 weeks.  If he is having     problems with increased pain and lower extremities, he may need to     see him sooner. 2. He will follow up with his surgeon, Dr. Ezzard Standing later this week. 3. He needs to follow up with Dr. Romero Belling in 1-2 weeks.  He will     need to make this appointment for himself.  Time on patient care today was 65 minutes.     Calvert Cantor, M.D.     SR/MEDQ  D:  06/25/2011  T:  06/25/2011  Job:  [id number]  cc:   Gregary Signs A. Everardo All, MD Verne Carrow, MD Sandria Bales. Ezzard Standing, M.D. Barbaraann Share, MD,FCCP  Electronically Signed by Calvert Cantor M.D. on 07/12/2011 03:33:40 PM
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
## 2011-08-02 ENCOUNTER — Other Ambulatory Visit: Payer: Self-pay

## 2011-08-02 MED ORDER — BISOPROLOL FUMARATE 5 MG PO TABS: 5.0000 mg | ORAL_TABLET | Freq: Every day | ORAL | Status: DC

## 2011-08-14 ENCOUNTER — Encounter: Payer: Medicare Other | Attending: Surgery | Admitting: *Deleted

## 2011-08-14 ENCOUNTER — Encounter: Payer: Self-pay | Admitting: Endocrinology

## 2011-08-14 ENCOUNTER — Ambulatory Visit (INDEPENDENT_AMBULATORY_CARE_PROVIDER_SITE_OTHER): Payer: Medicare Other | Admitting: Endocrinology

## 2011-08-14 DIAGNOSIS — R609 Edema, unspecified: Secondary | ICD-10-CM

## 2011-08-14 DIAGNOSIS — Z713 Dietary counseling and surveillance: Secondary | ICD-10-CM | POA: Insufficient documentation

## 2011-08-14 DIAGNOSIS — Z01818 Encounter for other preprocedural examination: Secondary | ICD-10-CM | POA: Insufficient documentation

## 2011-08-14 DIAGNOSIS — E109 Type 1 diabetes mellitus without complications: Secondary | ICD-10-CM

## 2011-08-14 DIAGNOSIS — N400 Enlarged prostate without lower urinary tract symptoms: Secondary | ICD-10-CM

## 2011-08-14 MED ORDER — FINASTERIDE 5 MG PO TABS: 5.0000 mg | ORAL_TABLET | Freq: Every day | ORAL | Status: DC

## 2011-08-14 MED ORDER — HYDROCODONE-ACETAMINOPHEN 5-500 MG PO TABS: 1.0000 | ORAL_TABLET | ORAL | Status: DC | PRN

## 2011-08-14 NOTE — Progress Notes (Signed)
  Follow-up visit: 12 Weeks Post-Operative LAGB Surgery  Medical Nutrition Therapy:  Appt start time: 1500 end time:  1530.  Assessment:  Primary concerns today: post-operative bariatric surgery nutrition management. Pt had a fall after surgery and was hospitalized and admitted to a rehab facility. He has been home now about 3 weeks and reports that he is doing well but that his weight has stopped.  Weight today: 214.2 lbs Weight change: 31.8 lbs since surgery Total weight lost: 31.8 lbs BMI: 55.6% Weight goal: 160-180 % Weight goal met: 37%  24-hr recall:  B (8-9 AM): 1 egg w/ peppers/onions, Greek yogurt Snk (10:30 AM): Unjury w/ skim milk   L (1 PM): Pc deli meat (3 oz) w/ one slice cheese Snk (PM): None reported  D (5:30 PM): 3oz hamburger patty w/ vegetable (cooked) Snk (8-9 PM): cheese slice w/ 6-8 triscuits  Fluid intake: decaf coffee (8oz), water, Crystal light, protein shake = >64oz  Estimated total protein intake: 80-100g  Medications: Miralax daily for constipation. Pt is down to 10-25 units of insulin/day  **(5-10 units before meals/prn) Supplementation: "I skip these most because I forget about them"  CBG monitoring: Daily Average CBG per patient: 100-200  Last patient reported A1c: No recent  Using straws: No Drinking while eating: Yes (a couple of sips) Hair loss: No Carbonated beverages: No N/V/D/C: Constipation (uses Miralax) Last Lap-Band fill: No band fill at this point  Recent physical activity:  Limited due to recent fall and leg/foot pain. Mild walking. Uses exercise band some.  Progress Towards Goal(s):  In progress.   Nutritional Diagnosis:  [place]-3.3 Overweight/obesity As related to recent LAGB surgery.  As evidenced by pt follow LAGB dietary guidelines for continued weight loss.    Intervention:   Follow Phase 3B: High Protein + Non-Starchy Vegetables  Eat 3-6 small meals/snacks, every 3-5 hrs  Increase lean protein foods to meet 80-100g  goal  Increase fluid intake to 64oz +  Add 15 grams of carbohydrate (fruit, whole grain, starchy vegetable) with meals  Avoid drinking 15 minutes before, during and 30 minutes after eating  Aim for >30 min of physical activity daily  Monitoring/Evaluation:  Dietary intake, exercise, lap band fills, and body weight. Follow up in 3 months for 6 month post-op visit.

## 2011-08-14 NOTE — Patient Instructions (Addendum)
Please see dr Daphine Deutscher as scheduled.  i believe your weight loss will resume.  When it does, please be prepared to reduce the insulin again. Resume furosemide at 40 mg daily. Please make a follow-up appointment in 1 month. Please stop the midodrine. Here is a refill of your pain medication. Please continue the same proscar.

## 2011-08-14 NOTE — Progress Notes (Signed)
Subjective:    Patient ID: [name], male    DOB: Oct 02, 1941, 70 y.o.   MRN: [id number]  HPI Pt had gastric banding 2 1/2 mos ago.  His weight has leveled off.  He will see dr Daphine Deutscher soon.  no cbg record, but states cbg's are usually well-controlled.  However, it is sometimes over 200 in am.  He has increased novolog to tid (qac) 09-27-09 units.  This has helped cbg's. He stopped diuretic rx prior to bariatric surgery.  He now has a few mos of moderate swelling of the legs, but no assoc pain (he has had orthopedic boots removed). Pt was started on midodrine for a syncopal episode 2 mos ago.  No more syncope. flomax was changed to proscar, due to the syncope. Past Medical History  Diagnosis Date  . Allergic rhinitis   . COPD (chronic obstructive pulmonary disease)     mild to moderate by pfts in 2006  . HLD (hyperlipidemia)   . Impotence   . PVD (peripheral vascular disease)   . Type II or unspecified type diabetes mellitus with renal manifestations, not stated as uncontrolled   . Renal insufficiency   . Osteoarthritis   . Cough     due to Zestril  . Encounter for long-term (current) use of aspirin   . Encounter for long-term (current) use of antiplatelets/antithrombotics   . Essential hypertension, benign   . Chronic diastolic heart failure   . Coronary atherosclerosis of native coronary artery   . Shortness of breath   . Other emphysema   . Edema   . Special screening for malignant neoplasm of prostate   . Conjunctivitis unspecified   . Hypopotassemia   . Osteoporosis, unspecified   . Secondary hyperparathyroidism (of renal origin)   . Gout, unspecified   . Pneumonia, organism unspecified   . 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 Hemiplegia affecting unspecified side, late effect of cerebrovascular disease   . Headache   . Other and unspecified hyperlipidemia   . Nephropathy, diabetic     Past Surgical  History  Procedure Date  . Nasal sinus surgery   . Esophagogastroduodenoscopy   . Cardiac catheterization 01/18/2005  . Abdominal surgery     History   Social History  . Marital Status: Married    Spouse Name: N/A    Number of Children: Y  . Years of Education: N/A   Occupational History  .  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     Social History Main Topics  . Smoking status: Former Smoker -- 2.0 packs/day for 41 years    Types: Cigarettes    Quit date: 12/24/1997  . Smokeless tobacco: Former Neurosurgeon  . Alcohol Use: No  . Drug Use: No  . Sexually Active: Not on file   Other Topics Concern  . Not on file   Social History Narrative  . No narrative on file    Current Outpatient Prescriptions on File Prior to Visit  Medication Sig Dispense Refill  . albuterol (VENTOLIN HFA) 108 (90 BASE) MCG/ACT inhaler Inhale 2 puffs into the lungs every 6 (six) hours as needed.        Marland Kitchen allopurinol (ZYLOPRIM) 300 MG tablet Take 300 mg by mouth daily.        Marland Kitchen aspirin 81 MG tablet Take 81 mg by mouth daily.        . Azelastine HCl (ASTEPRO) 0.15 % SOLN 2  sprays by Nasal route 2 (two) times daily as needed.        . bisoprolol (ZEBETA) 5 MG tablet Take 1 tablet (5 mg total) by mouth daily.  90 tablet  1  . calcitRIOL (ROCALTROL) 0.25 MCG capsule Take 0.25 mcg by mouth daily.        Marland Kitchen diltiazem (TIAZAC) 180 MG 24 hr capsule Take 180 mg by mouth daily.        . ferrous sulfate 325 (65 FE) MG tablet Take 325 mg by mouth 3 (three) times daily.        . folic acid-pyridoxine-cyancobalamin (FOLBIC) 2.5-25-2 MG TABS Take 1 tablet by mouth daily.        Marland Kitchen glucose blood test strip Four times a day, variable glucoses dx 250.03      . insulin aspart (NOVOLOG) 100 UNIT/ML injection Inject into the skin 3 (three) times daily before meals. 3x a day (just before each meal) 09-27-09 units      . INSULIN SYRINGE 1CC/29G (B-D INSULIN SYRINGE) 29G X 1/2" 1 ML MISC 4 (four) times daily. Dx 250.03      . isosorbide mononitrate  (IMDUR) 60 MG 24 hr tablet Take 1 tablet (60 mg total) by mouth daily.  30 tablet  3  . nitroGLYCERIN (NITROLINGUAL) 0.4 MG/SPRAY spray Place 1 spray under the tongue as directed.        Marland Kitchen omeprazole (PRILOSEC) 20 MG capsule Take 20 mg by mouth daily.        . polyethylene glycol powder (MIRALAX) powder Take 17 g by mouth as needed.        . rosuvastatin (CRESTOR) 10 MG tablet Take 10 mg by mouth daily.        Marland Kitchen tiotropium (SPIRIVA) 18 MCG inhalation capsule Place 18 mcg into inhaler and inhale daily.       . traZODone (DESYREL) 100 MG tablet TAKE 1 TABLET BY MOUTH EVERY NIGHT AT BEDTIME  90 tablet  0  . triamcinolone (KENALOG) 0.025 % cream APPLY THREE TIMES DAILY ON AFFECTED AREA AS NEEDED  80 g  0    Allergies  Allergen Reactions  . Enalapril Maleate     REACTION: cough  . Shellfish-Derived Products Swelling    Family History  Problem Relation Age of Onset  . Hypertension      family history of htn  . Heart disease Maternal Aunt   . Lung cancer Mother   . Cancer Mother     Lung Cancer  . Heart disease Father     CHF    BP 122/60  Pulse 69  Temp(Src) 97.9 F (36.6 C) (Oral)  Ht 5\' 3"  (1.6 m)  Wt 214 lb 12.8 oz (97.433 kg)  BMI 38.05 kg/m2  SpO2 94%    Review of Systems  Constitutional: Negative for fever.  Eyes: Negative for visual disturbance.  Cardiovascular: Negative for chest pain.  Gastrointestinal: Negative for abdominal pain.  Genitourinary: Negative for hematuria.  Musculoskeletal:       Low-back pain is well-controlled  Skin: Negative for rash.  Neurological: Negative for syncope.   denies hypoglycemia and sob.  Dizziness has resolved.  He still has slightly slow, but he says it is manageable.  No further falls.      Objective:   Physical Exam GENERAL: no distress Pulses: dorsalis pedis intact bilat.   Feet: no deformity.  no ulcer on the feet.  feet are of normal color and temp.  2+ right leg edema, and  1+ on the left.  Neuro: sensation is intact  to touch on the feet     Assessment & Plan:  Weight loss due to gastric banding--good so far, but has leveled off Edema, recurrent off diuretic rx. Dm, with apparently improved control H/o syncope, none since.  i would prefer he be off the midodrine Bph, off flomax due to the syncope.  resumpton of this can be considered in the future.

## 2011-08-14 NOTE — Patient Instructions (Signed)
Goals:  Follow Phase 3B: High Protein + Non-Starchy Vegetables  Eat 3-6 small meals/snacks, every 3-5 hrs  Increase lean protein foods to meet 80-100g goal  Increase fluid intake to 64oz +  Add 15 grams of carbohydrate (fruit, whole grain, starchy vegetable) with meals  Avoid drinking 15 minutes before, during and 30 minutes after eating  Aim for >30 min of physical activity daily 

## 2011-08-22 ENCOUNTER — Ambulatory Visit (INDEPENDENT_AMBULATORY_CARE_PROVIDER_SITE_OTHER): Payer: Medicare Other | Admitting: Endocrinology

## 2011-08-22 ENCOUNTER — Encounter: Payer: Self-pay | Admitting: Endocrinology

## 2011-08-22 VITALS — BP 124/64 | HR 66 | Temp 98.2°F | Ht 63.0 in | Wt 214.2 lb

## 2011-08-22 DIAGNOSIS — L03031 Cellulitis of right toe: Secondary | ICD-10-CM | POA: Insufficient documentation

## 2011-08-22 DIAGNOSIS — E119 Type 2 diabetes mellitus without complications: Secondary | ICD-10-CM

## 2011-08-22 DIAGNOSIS — L03039 Cellulitis of unspecified toe: Secondary | ICD-10-CM

## 2011-08-22 MED ORDER — FINASTERIDE 5 MG PO TABS: 5.0000 mg | ORAL_TABLET | Freq: Every day | ORAL | Status: DC

## 2011-08-22 MED ORDER — DOXYCYCLINE HYCLATE 100 MG PO TABS: 100.0000 mg | ORAL_TABLET | Freq: Two times a day (BID) | ORAL | Status: AC

## 2011-08-22 NOTE — Patient Instructions (Addendum)
i have sent a prescription to your pharmacy Refer to a foot doctor.  you will receive a phone call, about a day and time for an appointment. Resume the nph insulin at 5 units at bedtime.  Return as scheduled.

## 2011-08-22 NOTE — Progress Notes (Signed)
Subjective:    Patient ID: [name], male    DOB: 16-Nov-1941, 70 y.o.   MRN: [id number]  HPI Pt states few days of slight swelling of the right great toenail paronychial area, and assoc pain.   no cbg record, but states cbg's are "sometimes high."  It is highest in am (sometimes 200).  It is higher in am than at hs. He is due to see dr Ezzard Standing next week.   Past Medical History  Diagnosis Date  . Allergic rhinitis   . COPD (chronic obstructive pulmonary disease)     mild to moderate by pfts in 2006  . HLD (hyperlipidemia)   . Impotence   . PVD (peripheral vascular disease)   . Type II or unspecified type diabetes mellitus with renal manifestations, not stated as uncontrolled   . Renal insufficiency   . Osteoarthritis   . Cough     due to Zestril  . Encounter for long-term (current) use of aspirin   . Encounter for long-term (current) use of antiplatelets/antithrombotics   . Essential hypertension, benign   . Chronic diastolic heart failure   . Coronary atherosclerosis of native coronary artery   . Shortness of breath   . Other emphysema   . Edema   . Special screening for malignant neoplasm of prostate   . Conjunctivitis unspecified   . Hypopotassemia   . Osteoporosis, unspecified   . Secondary hyperparathyroidism (of renal origin)   . Gout, unspecified   . Pneumonia, organism unspecified   . 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 Hemiplegia affecting unspecified side, late effect of cerebrovascular disease   . Headache   . Other and unspecified hyperlipidemia   . Nephropathy, diabetic     Past Surgical History  Procedure Date  . Nasal sinus surgery   . Esophagogastroduodenoscopy   . Cardiac catheterization 01/18/2005  . Abdominal surgery     History   Social History  . Marital Status: Married    Spouse Name: N/A    Number of Children: Y  . Years of Education: N/A   Occupational History  .   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     Social History Main Topics  . Smoking status: Former Smoker -- 2.0 packs/day for 41 years    Types: Cigarettes    Quit date: 12/24/1997  . Smokeless tobacco: Former Neurosurgeon  . Alcohol Use: No  . Drug Use: No  . Sexually Active: Not on file   Other Topics Concern  . Not on file   Social History Narrative  . No narrative on file    Current Outpatient Prescriptions on File Prior to Visit  Medication Sig Dispense Refill  . albuterol (VENTOLIN HFA) 108 (90 BASE) MCG/ACT inhaler Inhale 2 puffs into the lungs every 6 (six) hours as needed.        Marland Kitchen allopurinol (ZYLOPRIM) 300 MG tablet Take 300 mg by mouth daily.        Marland Kitchen aspirin 81 MG tablet Take 81 mg by mouth daily.        . Azelastine HCl (ASTEPRO) 0.15 % SOLN 2 sprays by Nasal route 2 (two) times daily as needed.        . bisoprolol (ZEBETA) 5 MG tablet Take 1 tablet (5 mg total) by mouth daily.  90 tablet  1  . calcitRIOL (ROCALTROL) 0.25 MCG capsule Take 0.25 mcg by mouth daily.        Marland Kitchen  diltiazem (TIAZAC) 180 MG 24 hr capsule Take 180 mg by mouth daily.        . ferrous sulfate 325 (65 FE) MG tablet Take 325 mg by mouth 3 (three) times daily.        . folic acid-pyridoxine-cyancobalamin (FOLBIC) 2.5-25-2 MG TABS Take 1 tablet by mouth daily.        . furosemide (LASIX) 40 MG tablet Take 40 mg by mouth 2 (two) times daily.        Marland Kitchen glucose blood test strip Four times a day, variable glucoses dx 250.03      . HYDROcodone-acetaminophen (VICODIN) 5-500 MG per tablet Take 1-2 tablets by mouth every 4 (four) hours as needed.  100 tablet  5  . insulin aspart (NOVOLOG) 100 UNIT/ML injection Inject into the skin 3 (three) times daily before meals. 3x a day (just before each meal) 09-27-09 units      . INSULIN SYRINGE 1CC/29G (B-D INSULIN SYRINGE) 29G X 1/2" 1 ML MISC 4 (four) times daily. Dx 250.03      . isosorbide mononitrate (IMDUR) 60 MG 24 hr tablet Take 1 tablet (60 mg total) by mouth daily.  30 tablet  3  .  midodrine (PROAMATINE) 5 MG tablet Take 5 mg by mouth 3 (three) times daily.        . nitroGLYCERIN (NITROLINGUAL) 0.4 MG/SPRAY spray Place 1 spray under the tongue as directed.        Marland Kitchen omeprazole (PRILOSEC) 20 MG capsule Take 20 mg by mouth daily.        . polyethylene glycol powder (MIRALAX) powder Take 17 g by mouth as needed.        . rosuvastatin (CRESTOR) 10 MG tablet Take 10 mg by mouth daily.        Marland Kitchen tiotropium (SPIRIVA) 18 MCG inhalation capsule Place 18 mcg into inhaler and inhale daily.       . traZODone (DESYREL) 100 MG tablet TAKE 1 TABLET BY MOUTH EVERY NIGHT AT BEDTIME  90 tablet  0  . triamcinolone (KENALOG) 0.025 % cream APPLY THREE TIMES DAILY ON AFFECTED AREA AS NEEDED  80 g  0   Allergies  Allergen Reactions  . Enalapril Maleate     REACTION: cough  . Shellfish-Derived Products Swelling   Family History  Problem Relation Age of Onset  . Hypertension      family history of htn  . Heart disease Maternal Aunt   . Lung cancer Mother   . Cancer Mother     Lung Cancer  . Heart disease Father     CHF   BP 124/64  Pulse 66  Temp(Src) 98.2 F (36.8 C) (Oral)  Ht 5\' 3"  (1.6 m)  Wt 214 lb 3.2 oz (97.16 kg)  BMI 37.94 kg/m2  SpO2 94%  Review of Systems Denies fever and hypoglycemia.    Objective:   Physical Exam VITAL SIGNS:  See vs page GENERAL: no distress Right foot:  The great toenail is ingrown at its lateral aspect.  There is slight adjacent erythema and drainage.     Assessment & Plan:  Paronychia, new Dm, needs increased rx

## 2011-08-28 ENCOUNTER — Encounter (INDEPENDENT_AMBULATORY_CARE_PROVIDER_SITE_OTHER): Payer: Self-pay

## 2011-08-30 ENCOUNTER — Ambulatory Visit (INDEPENDENT_AMBULATORY_CARE_PROVIDER_SITE_OTHER): Payer: Medicare Other | Admitting: Surgery

## 2011-08-30 VITALS — BP 136/70 | Ht 63.0 in | Wt 219.0 lb

## 2011-08-30 DIAGNOSIS — Z9884 Bariatric surgery status: Secondary | ICD-10-CM

## 2011-08-30 NOTE — Progress Notes (Signed)
HISTORY OF PRESENT ILLNESS: The patient is a 70 year old white male who is a patient of Dr. Romero Belling. He sees Dr. Earney Hamburg from a cardiology standpoint. He sees Dr. Marcelyn Bruins from a pulmonary standpoint. He sees Dr. Casimiro Needle from a renal standpoint and Dr. Vic Blackbird from a urology standpoint.  He underwent placement of a AP larger lap band on May 29, 2011 by Dr. Algis Downs. Julius Matus for morbid obesity. He did well with the surgery and was discharged the day after surgery. Unfortunately since discharge, he had a syncopal episode at home and was admitted to [facility] on 11 June 2011. He was found to have broken bones in both feet. He was seen by Dr. Dorene Grebe from an orthopedic standpoint.  He comes today for the first fill of his lap band.  His diabetes is doing well.  His foot fractures have healed.  He has been released by Dr. Diamantina Providence.  He moved out of [name] (rehab unit) on August 8.  He helped his daughter (who has MS) move out of her rehab today.  Physical exam: BP 136/70  Ht 5\' 3"  (1.6 m)  Wt 219 lb (99.338 kg)  BMI 38.79 kg/m2  Lungs: Clear to auscultation. Heart: Regular rate and rhythm. Abdomen: His incisions are well-healed. The port for his lap band is in the right upper quadrant and easily palpable.  While in the office, I accessed his band and added 3 cc of saline.  He tolerated water prior to leaving the office.  Assessment and plan:  #1. Status post placement lap band.  While in the office, I added 3 cc (he already had 3 cc in the band) for a total of 6 cc.  [note:  At first I added 4 cc, but had to withdraw 1 cc.]  He is to stay on liquids for 2 days, then start solids back slowly.  Plan to see him back in 6-8 weeks.  2. Insulin-dependent diabetes mellitus. Better.  He is down to using 20 - 25 units/day. 3.  COPD 4. Hypertension. 5. Coronary artery disease with stent placement. 6. Bilateral lower extremity fractures.  Now healed and able  to walk.  Distance he can walk more limited by COPD. 7. Remote history of pulmonary embolism. 8. Chronic renal insufficiency.

## 2011-08-30 NOTE — Patient Instructions (Signed)
Take liquids for 2 - 3 days, then start solid food slowly.  Return in 6 - 8 weeks.

## 2011-08-31 ENCOUNTER — Other Ambulatory Visit: Payer: Self-pay | Admitting: Endocrinology

## 2011-09-14 ENCOUNTER — Encounter: Payer: Self-pay | Admitting: Endocrinology

## 2011-09-14 ENCOUNTER — Ambulatory Visit (INDEPENDENT_AMBULATORY_CARE_PROVIDER_SITE_OTHER): Payer: Medicare Other | Admitting: Endocrinology

## 2011-09-14 VITALS — BP 128/58 | HR 67 | Temp 97.7°F | Ht 63.0 in | Wt 216.0 lb

## 2011-09-14 DIAGNOSIS — R609 Edema, unspecified: Secondary | ICD-10-CM

## 2011-09-14 DIAGNOSIS — E109 Type 1 diabetes mellitus without complications: Secondary | ICD-10-CM

## 2011-09-14 DIAGNOSIS — Z23 Encounter for immunization: Secondary | ICD-10-CM

## 2011-09-14 NOTE — Progress Notes (Signed)
Subjective:    Patient ID: [name], male    DOB: 02-03-1941, 70 y.o.   MRN: [id number]  HPI Pt says he recently had his gastric band filled, but he says his appetite continues to be too much.  no cbg record, but he states cbg's are improved.  He says it varies from 90-200, but most are in the low to mid-100's.  It is still highest am.   Past Medical History  Diagnosis Date  . Allergic rhinitis   . COPD (chronic obstructive pulmonary disease)     mild to moderate by pfts in 2006  . HLD (hyperlipidemia)   . Impotence   . PVD (peripheral vascular disease)   . Type II or unspecified type diabetes mellitus with renal manifestations, not stated as uncontrolled   . Renal insufficiency   . Osteoarthritis   . Cough     due to Zestril  . Encounter for long-term (current) use of aspirin   . Encounter for long-term (current) use of antiplatelets/antithrombotics   . Essential hypertension, benign   . Chronic diastolic heart failure   . Coronary atherosclerosis of native coronary artery   . Shortness of breath   . Other emphysema   . Edema   . Special screening for malignant neoplasm of prostate   . Conjunctivitis unspecified   . Hypopotassemia   . Osteoporosis, unspecified   . Secondary hyperparathyroidism (of renal origin)   . Gout, unspecified   . Pneumonia, organism unspecified   . 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 Hemiplegia affecting unspecified side, late effect of cerebrovascular disease   . Headache   . Other and unspecified hyperlipidemia   . Nephropathy, diabetic     Past Surgical History  Procedure Date  . Nasal sinus surgery   . Esophagogastroduodenoscopy   . Cardiac catheterization 01/18/2005  . Abdominal surgery   . Laparoscopic gastric banding 05/29/2011    History   Social History  . Marital Status: Married    Spouse Name: N/A    Number of Children: Y  . Years of Education: N/A    Occupational History  .  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     Social History Main Topics  . Smoking status: Former Smoker -- 2.0 packs/day for 41 years    Types: Cigarettes    Quit date: 12/24/1997  . Smokeless tobacco: Former Neurosurgeon  . Alcohol Use: No  . Drug Use: No  . Sexually Active: Not on file   Other Topics Concern  . Not on file   Social History Narrative  . No narrative on file    Current Outpatient Prescriptions on File Prior to Visit  Medication Sig Dispense Refill  . albuterol (VENTOLIN HFA) 108 (90 BASE) MCG/ACT inhaler Inhale 2 puffs into the lungs every 6 (six) hours as needed.        Marland Kitchen allopurinol (ZYLOPRIM) 300 MG tablet Take 300 mg by mouth daily.        Marland Kitchen aspirin 81 MG tablet Take 81 mg by mouth daily.        . Azelastine HCl (ASTEPRO) 0.15 % SOLN 2 sprays by Nasal route 2 (two) times daily as needed.        . bisoprolol (ZEBETA) 5 MG tablet Take 1 tablet (5 mg total) by mouth daily.  90 tablet  1  . calcitRIOL (ROCALTROL) 0.25 MCG capsule Take 0.25 mcg by mouth daily.        Marland Kitchen  diltiazem (TIAZAC) 180 MG 24 hr capsule Take 180 mg by mouth daily.        . ferrous sulfate 325 (65 FE) MG tablet Take 325 mg by mouth 3 (three) times daily.        . finasteride (PROSCAR) 5 MG tablet Take 1 tablet (5 mg total) by mouth daily.  30 tablet  11  . folic acid-pyridoxine-cyancobalamin (FOLBIC) 2.5-25-2 MG TABS Take 1 tablet by mouth daily.        . furosemide (LASIX) 40 MG tablet Take 40 mg by mouth 2 (two) times daily.        Marland Kitchen glucose blood test strip Four times a day, variable glucoses dx 250.03      . HYDROcodone-acetaminophen (VICODIN) 5-500 MG per tablet Take 1-2 tablets by mouth every 4 (four) hours as needed.  100 tablet  5  . insulin aspart (NOVOLOG) 100 UNIT/ML injection Inject into the skin 3 (three) times daily before meals. 3x a day (just before each meal) 10-5-5 units      . insulin NPH (HUMULIN N) 100 UNIT/ML injection Inject 10 Units into the skin at bedtime.       .  INSULIN SYRINGE 1CC/29G (B-D INSULIN SYRINGE) 29G X 1/2" 1 ML MISC 4 (four) times daily. Dx 250.03      . isosorbide mononitrate (IMDUR) 60 MG 24 hr tablet Take 1 tablet (60 mg total) by mouth daily.  30 tablet  3  . nitroGLYCERIN (NITROLINGUAL) 0.4 MG/SPRAY spray Place 1 spray under the tongue as directed.        Marland Kitchen omeprazole (PRILOSEC) 20 MG capsule Take 20 mg by mouth daily.        . polyethylene glycol powder (MIRALAX) powder Take 17 g by mouth as needed.        . rosuvastatin (CRESTOR) 10 MG tablet Take 10 mg by mouth daily.        Marland Kitchen tiotropium (SPIRIVA) 18 MCG inhalation capsule Place 18 mcg into inhaler and inhale daily.       . traZODone (DESYREL) 100 MG tablet TAKE 1 TABLET BY MOUTH EVERY NIGHT AT BEDTIME  90 tablet  2  . triamcinolone (KENALOG) 0.025 % cream APPLY THREE TIMES DAILY ON AFFECTED AREA AS NEEDED  80 g  0    Allergies  Allergen Reactions  . Enalapril Maleate     REACTION: cough  . Shellfish-Derived Products Swelling    Family History  Problem Relation Age of Onset  . Hypertension      family history of htn  . Heart disease Maternal Aunt   . Lung cancer Mother   . Cancer Mother     Lung Cancer  . Heart disease Father     CHF    BP 128/58  Pulse 67  Temp(Src) 97.7 F (36.5 C) (Oral)  Ht 5\' 3"  (1.6 m)  Wt 216 lb (97.977 kg)  BMI 38.26 kg/m2  SpO2 96%  Review of Systems denies hypoglycemia and n/v    Objective:   Physical Exam VITAL SIGNS:  See vs page GENERAL: no distress PSYCH: Alert and oriented x 3.  Does not appear anxious nor depressed.       Assessment & Plan:

## 2011-09-14 NOTE — Patient Instructions (Addendum)
Increase nph to 10 units at bedtime Decrease novolog to 3x a day (just before each meal) 10-5-5 units. Please make a "medicare wellness" appointment in 6 weeks. check your blood sugar 2 times a day.  vary the time of day when you check, between before the 3 meals, and at bedtime.  also check if you have symptoms of your blood sugar being too high or too low.  please keep a record of the readings and bring it to your next appointment here.  please call us sooner if you are having low blood sugar episodes, or if it stays over 200.

## 2011-09-20 ENCOUNTER — Other Ambulatory Visit: Payer: Self-pay | Admitting: Endocrinology

## 2011-09-22 ENCOUNTER — Other Ambulatory Visit: Payer: Self-pay | Admitting: Endocrinology

## 2011-09-26 ENCOUNTER — Other Ambulatory Visit: Payer: Self-pay

## 2011-09-26 MED ORDER — ISOSORBIDE MONONITRATE ER 60 MG PO TB24: 60.0000 mg | ORAL_TABLET | Freq: Every day | ORAL | Status: DC

## 2011-09-27 ENCOUNTER — Other Ambulatory Visit: Payer: Self-pay | Admitting: Endocrinology

## 2011-10-04 LAB — I-STAT 8, (EC8 V) (CONVERTED LAB)
Acid-Base Excess: 16 — ABNORMAL HIGH
Chloride: 79 — CL
HCT: 44
Operator id: 272551
Potassium: 2.5 — CL
TCO2: 42
pCO2, Ven: 48.1

## 2011-10-04 LAB — COMPREHENSIVE METABOLIC PANEL
ALT: 21
AST: 21
Albumin: 3.1 — ABNORMAL LOW
Albumin: 3.7
Alkaline Phosphatase: 80
BUN: 108 — ABNORMAL HIGH
CO2: 35 — ABNORMAL HIGH
Chloride: 88 — ABNORMAL LOW
Creatinine, Ser: 2.84 — ABNORMAL HIGH
Creatinine, Ser: 3.47 — ABNORMAL HIGH
GFR calc Af Amer: 27 — ABNORMAL LOW
GFR calc non Af Amer: 22 — ABNORMAL LOW
Potassium: 3.2 — ABNORMAL LOW
Sodium: 132 — ABNORMAL LOW
Total Bilirubin: 0.8
Total Protein: 6.5

## 2011-10-04 LAB — URINALYSIS, ROUTINE W REFLEX MICROSCOPIC
Bilirubin Urine: NEGATIVE
Hgb urine dipstick: NEGATIVE
Ketones, ur: NEGATIVE
Nitrite: NEGATIVE
pH: 5

## 2011-10-04 LAB — DIFFERENTIAL
Basophils Absolute: 0
Basophils Relative: 0
Eosinophils Absolute: 0
Eosinophils Relative: 0
Monocytes Absolute: 0.8 — ABNORMAL HIGH

## 2011-10-04 LAB — CBC
HCT: 40.1
Hemoglobin: 13.5
MCHC: 33.8
MCV: 85.1
RDW: 16 — ABNORMAL HIGH

## 2011-10-04 LAB — PROTIME-INR: INR: 2.5 — ABNORMAL HIGH

## 2011-10-04 LAB — POCT I-STAT CREATININE
Creatinine, Ser: 3.2 — ABNORMAL HIGH
Operator id: 272551

## 2011-10-11 LAB — BASIC METABOLIC PANEL
BUN: 59 — ABNORMAL HIGH
CO2: 20
CO2: 21
CO2: 25
Calcium: 8.2 — ABNORMAL LOW
Calcium: 8.6
Calcium: 8.7
Calcium: 8.9
Chloride: 100
Chloride: 101
Creatinine, Ser: 1.99 — ABNORMAL HIGH
Creatinine, Ser: 2.23 — ABNORMAL HIGH
Creatinine, Ser: 2.31 — ABNORMAL HIGH
GFR calc Af Amer: 35 — ABNORMAL LOW
GFR calc Af Amer: 36 — ABNORMAL LOW
GFR calc Af Amer: 36 — ABNORMAL LOW
GFR calc Af Amer: 38 — ABNORMAL LOW
GFR calc Af Amer: 39 — ABNORMAL LOW
GFR calc Af Amer: 41 — ABNORMAL LOW
GFR calc Af Amer: 41 — ABNORMAL LOW
GFR calc Af Amer: 42 — ABNORMAL LOW
GFR calc non Af Amer: 29 — ABNORMAL LOW
GFR calc non Af Amer: 30 — ABNORMAL LOW
GFR calc non Af Amer: 30 — ABNORMAL LOW
GFR calc non Af Amer: 31 — ABNORMAL LOW
GFR calc non Af Amer: 32 — ABNORMAL LOW
GFR calc non Af Amer: 35 — ABNORMAL LOW
Glucose, Bld: 53 — ABNORMAL LOW
Potassium: 3.4 — ABNORMAL LOW
Potassium: 4.2
Potassium: 4.4
Potassium: 5
Sodium: 136
Sodium: 137
Sodium: 138
Sodium: 140

## 2011-10-11 LAB — BLOOD GAS, ARTERIAL
Bicarbonate: 18.9 — ABNORMAL LOW
O2 Content: 3
pCO2 arterial: 34.7 — ABNORMAL LOW
pO2, Arterial: 67.9 — ABNORMAL LOW

## 2011-10-11 LAB — CBC
HCT: 33.4 — ABNORMAL LOW
HCT: 34.2 — ABNORMAL LOW
HCT: 40.3
Hemoglobin: 11.3 — ABNORMAL LOW
Hemoglobin: 13.4
MCHC: 34
MCHC: 34.8
MCV: 86.9
MCV: 87.1
MCV: 88.4
Platelets: 244
Platelets: 257
RBC: 3.72 — ABNORMAL LOW
RBC: 3.96 — ABNORMAL LOW
RBC: 3.99 — ABNORMAL LOW
RBC: 4.56
RDW: 15.1 — ABNORMAL HIGH
RDW: 15.6 — ABNORMAL HIGH
WBC: 10.2
WBC: 10.4
WBC: 10.7 — ABNORMAL HIGH
WBC: 12.6 — ABNORMAL HIGH
WBC: 16.3 — ABNORMAL HIGH

## 2011-10-11 LAB — PROTIME-INR
INR: 1.3
INR: 2.2 — ABNORMAL HIGH
INR: 3.7 — ABNORMAL HIGH
Prothrombin Time: 25.2 — ABNORMAL HIGH
Prothrombin Time: 27.9 — ABNORMAL HIGH
Prothrombin Time: 41.9 — ABNORMAL HIGH

## 2011-10-11 LAB — CARDIAC PANEL(CRET KIN+CKTOT+MB+TROPI)
Total CK: 120
Total CK: 121

## 2011-10-11 LAB — B-NATRIURETIC PEPTIDE (CONVERTED LAB)
Pro B Natriuretic peptide (BNP): 226 — ABNORMAL HIGH
Pro B Natriuretic peptide (BNP): 660 — ABNORMAL HIGH

## 2011-10-11 LAB — HEPARIN LEVEL (UNFRACTIONATED)
Heparin Unfractionated: 0.61
Heparin Unfractionated: 0.76 — ABNORMAL HIGH

## 2011-10-11 LAB — SODIUM, URINE, RANDOM: Sodium, Ur: <10

## 2011-10-21 ENCOUNTER — Emergency Department (HOSPITAL_COMMUNITY): Payer: Medicare Other

## 2011-10-21 ENCOUNTER — Inpatient Hospital Stay (HOSPITAL_COMMUNITY)
Admission: EM | Admit: 2011-10-21 | Discharge: 2011-10-22 | DRG: 313 | Disposition: A | Discharge status: Home / Self Care | Payer: Medicare Other | Source: Ambulatory Visit | Attending: Internal Medicine | Admitting: Internal Medicine

## 2011-10-21 DIAGNOSIS — E669 Obesity, unspecified: Secondary | ICD-10-CM | POA: Diagnosis present

## 2011-10-21 DIAGNOSIS — Z8673 Personal history of transient ischemic attack (TIA), and cerebral infarction without residual deficits: Secondary | ICD-10-CM

## 2011-10-21 DIAGNOSIS — Z7982 Long term (current) use of aspirin: Secondary | ICD-10-CM

## 2011-10-21 DIAGNOSIS — I5042 Chronic combined systolic (congestive) and diastolic (congestive) heart failure: Secondary | ICD-10-CM | POA: Diagnosis present

## 2011-10-21 DIAGNOSIS — Z9884 Bariatric surgery status: Secondary | ICD-10-CM

## 2011-10-21 DIAGNOSIS — I252 Old myocardial infarction: Secondary | ICD-10-CM

## 2011-10-21 DIAGNOSIS — Z86711 Personal history of pulmonary embolism: Secondary | ICD-10-CM

## 2011-10-21 DIAGNOSIS — J449 Chronic obstructive pulmonary disease, unspecified: Secondary | ICD-10-CM | POA: Diagnosis present

## 2011-10-21 DIAGNOSIS — K219 Gastro-esophageal reflux disease without esophagitis: Secondary | ICD-10-CM | POA: Diagnosis present

## 2011-10-21 DIAGNOSIS — N183 Chronic kidney disease, stage 3 unspecified: Secondary | ICD-10-CM | POA: Diagnosis present

## 2011-10-21 DIAGNOSIS — Z9861 Coronary angioplasty status: Secondary | ICD-10-CM

## 2011-10-21 DIAGNOSIS — J4489 Other specified chronic obstructive pulmonary disease: Secondary | ICD-10-CM | POA: Diagnosis present

## 2011-10-21 DIAGNOSIS — M109 Gout, unspecified: Secondary | ICD-10-CM | POA: Diagnosis present

## 2011-10-21 DIAGNOSIS — I509 Heart failure, unspecified: Secondary | ICD-10-CM | POA: Diagnosis present

## 2011-10-21 DIAGNOSIS — E785 Hyperlipidemia, unspecified: Secondary | ICD-10-CM | POA: Diagnosis present

## 2011-10-21 DIAGNOSIS — M810 Age-related osteoporosis without current pathological fracture: Secondary | ICD-10-CM | POA: Diagnosis present

## 2011-10-21 DIAGNOSIS — Z79899 Other long term (current) drug therapy: Secondary | ICD-10-CM

## 2011-10-21 DIAGNOSIS — R0789 Other chest pain: Principal | ICD-10-CM | POA: Diagnosis present

## 2011-10-21 DIAGNOSIS — I2510 Atherosclerotic heart disease of native coronary artery without angina pectoris: Secondary | ICD-10-CM | POA: Diagnosis present

## 2011-10-21 DIAGNOSIS — Z794 Long term (current) use of insulin: Secondary | ICD-10-CM

## 2011-10-21 DIAGNOSIS — Z882 Allergy status to sulfonamides status: Secondary | ICD-10-CM

## 2011-10-21 DIAGNOSIS — I129 Hypertensive chronic kidney disease with stage 1 through stage 4 chronic kidney disease, or unspecified chronic kidney disease: Secondary | ICD-10-CM | POA: Diagnosis present

## 2011-10-21 DIAGNOSIS — E119 Type 2 diabetes mellitus without complications: Secondary | ICD-10-CM | POA: Diagnosis present

## 2011-10-21 LAB — DIFFERENTIAL
Basophils Absolute: 0 10*3/uL (ref 0.0–0.1)
Lymphocytes Relative: 8 % — ABNORMAL LOW (ref 12–46)
Lymphs Abs: 0.9 10*3/uL (ref 0.7–4.0)
Neutro Abs: 10.3 10*3/uL — ABNORMAL HIGH (ref 1.7–7.7)
Neutrophils Relative %: 84 % — ABNORMAL HIGH (ref 43–77)

## 2011-10-21 LAB — CBC
HCT: 41.8 % (ref 39.0–52.0)
MCV: 85 fL (ref 78.0–100.0)
RBC: 4.92 MIL/uL (ref 4.22–5.81)
WBC: 12.2 10*3/uL — ABNORMAL HIGH (ref 4.0–10.5)

## 2011-10-21 LAB — COMPREHENSIVE METABOLIC PANEL
AST: 11 U/L (ref 0–37)
Albumin: 3.8 g/dL (ref 3.5–5.2)
Alkaline Phosphatase: 101 U/L (ref 39–117)
Chloride: 95 mEq/L — ABNORMAL LOW (ref 96–112)
Creatinine, Ser: 1.33 mg/dL (ref 0.50–1.35)
Potassium: 4.4 mEq/L (ref 3.5–5.1)
Total Bilirubin: 0.3 mg/dL (ref 0.3–1.2)
Total Protein: 7.5 g/dL (ref 6.0–8.3)

## 2011-10-21 LAB — POCT I-STAT TROPONIN I: Troponin i, poc: 0.01 ng/mL (ref 0.00–0.08)

## 2011-10-21 LAB — PROTIME-INR: INR: 1.03 (ref 0.00–1.49)

## 2011-10-21 LAB — GLUCOSE, CAPILLARY: Glucose-Capillary: 284 mg/dL — ABNORMAL HIGH (ref 70–99)

## 2011-10-21 MED ORDER — IOHEXOL 300 MG/ML  SOLN
100.0000 mL | Freq: Once | INTRAMUSCULAR | Status: AC | PRN
  Administered 2011-10-21: 100 mL via INTRAVENOUS

## 2011-10-22 ENCOUNTER — Emergency Department (HOSPITAL_COMMUNITY): Payer: Medicare Other

## 2011-10-22 DIAGNOSIS — R079 Chest pain, unspecified: Secondary | ICD-10-CM

## 2011-10-22 DIAGNOSIS — R0602 Shortness of breath: Secondary | ICD-10-CM

## 2011-10-22 LAB — CARDIAC PANEL(CRET KIN+CKTOT+MB+TROPI)
CK, MB: 1.4 ng/mL (ref 0.3–4.0)
Relative Index: INVALID (ref 0.0–2.5)
Total CK: 30 U/L (ref 7–232)
Total CK: 36 U/L (ref 7–232)
Troponin I: <0.3 ng/mL (ref <0.30)

## 2011-10-22 LAB — CK TOTAL AND CKMB (NOT AT ARMC)
CK, MB: 1.6 ng/mL (ref 0.3–4.0)
Relative Index: INVALID (ref 0.0–2.5)

## 2011-10-22 LAB — LIPID PANEL
LDL Cholesterol: 66 mg/dL (ref 0–99)
Total CHOL/HDL Ratio: 2.8 RATIO
VLDL: 19 mg/dL (ref 0–40)

## 2011-10-22 LAB — BASIC METABOLIC PANEL
Calcium: 9.4 mg/dL (ref 8.4–10.5)
GFR calc Af Amer: 61 mL/min — ABNORMAL LOW (ref >90)
GFR calc non Af Amer: 53 mL/min — ABNORMAL LOW (ref >90)
Glucose, Bld: 83 mg/dL (ref 70–99)
Potassium: 4 mEq/L (ref 3.5–5.1)
Sodium: 137 mEq/L (ref 135–145)

## 2011-10-22 LAB — LIPASE, BLOOD: Lipase: 12 U/L (ref 11–59)

## 2011-10-22 LAB — CBC
Hemoglobin: 12.3 g/dL — ABNORMAL LOW (ref 13.0–17.0)
MCH: 27.8 pg (ref 26.0–34.0)
MCHC: 32.9 g/dL (ref 30.0–36.0)
Platelets: 239 10*3/uL (ref 150–400)

## 2011-10-22 LAB — TROPONIN I: Troponin I: <0.3 ng/mL (ref <0.30)

## 2011-10-22 LAB — GLUCOSE, CAPILLARY: Glucose-Capillary: 257 mg/dL — ABNORMAL HIGH (ref 70–99)

## 2011-10-22 NOTE — H&P (Signed)
[name], [name] NO.:  [id number]  MEDICAL RECORD NO.:  [id number]  LOCATION:  [facility]                         FACILITY:  [facility]  PHYSICIAN:  Osvaldo Shipper, MD     DATE OF BIRTH:  1941/09/30  DATE OF ADMISSION:  10/21/2011 DATE OF DISCHARGE:                             HISTORY & PHYSICAL   PRIMARY CARE PHYSICIAN:  Sean A. Everardo All, MD  CARDIOLOGIST:  Verne Carrow, MD  ADMISSION DIAGNOSES: 1. Chest pain, rule out acute coronary syndrome. 2. History of coronary artery disease. 3. History of systolic and diastolic dysfunction, chronic. 4. Diabetes, on insulin. 5. History of hypertension. 6. Abdominal swelling and bloating, etiology unclear.  CHIEF COMPLAINT:  Shortness of breath, chest pain since this morning.  HISTORY OF PRESENT ILLNESS:  The patient is a 70 year old Caucasian male with a past medical history of CAD, CHF, diabetes, hypertension, pulmonary embolism, and renal insufficiency, who was in his usual state of health until this morning when he woke up, felt uncomfortable.  He had some jaw pain and then it started radiating to the left arm.  He had some yogurt, then he got short of breath, and had some difficulty swallowing and had some pain in his neck area, and then he experienced chest pain through the retrosternal area radiating to the left side. The pain was 7/10 in intensity.  He took nitroglycerin spray, which relieved the pain.  Currently, he does not have any pain.  He has been symptomatic most of the day today.  He had some diaphoresis.  No palpitations.  He had some nausea, but no emesis.  He is also complaining of feeling bloated all the time.  He also is complaining of burping a lot, which his daughter says he has been doing quite a bit of that ever since his gastric bypass procedure.  There is no real precipitating, aggravating, or relieving factors for his symptomatology.  He is short of breath, however, denies any cough  or fever.  He did go to the beach last week and that trip was uneventful except that he was then noted by the podiatrist to have some bruising in his right lower extremity, and he was found to have occult fractures, it is unclear which location.  He is, however, able to weight bear.  MEDICATIONS:  At home include the following; 1. Lasix 40 mg twice daily. 2. Triamcinolone topical cream as needed for rash. 3. Trazodone 100 mg at bedtime. 4. Symbicort 160 x 4.5 two puffs inhaled twice daily. 5. Spiriva 18 mcg every morning, inhaled. 6. Solgar solution daily. 7. Senokot daily as needed 2 tablets for constipation. 8. Omeprazole 20 mg every morning. 9. NovoLog on a sliding scale. 10.Novolin N 10 units at bedtime. 11.Nitroglycerin sublingually as needed for chest pain. 12.Multivitamins 1 tab daily. 13.MiraLAX 17 g daily as needed for constipation. 14.Isosorbide mononitrate XR 60 mg every morning. 15.Vicodin 5/500 one to two tablets every 4 hours as needed for pain. 16.Foltx 2.5 one tablet daily. 17.Finasteride 5 mg daily. 18.Ferrous sulfate 325 mg twice daily. 19.Diltiazem 180 mg every morning. 20.Crestor 10 mg at bedtime. 21.Calcitriol 0.25 mg every morning. 22.Bisoprolol 10 mg every morning. 23.Astepro 0.15  nasal spray twice daily as needed for congestion. 24.Aspirin 81 mg at bedtime. 25.Allopurinol 300 mg every morning. 26.Albuterol inhaler 2 puffs every 6 hours as needed for shortness of     breath.  ALLERGIES:  ACE INHIBITORS, SHELLFISH, and IODINE.  PAST MEDICAL HISTORY: 1. Positive for coronary artery disease status post stenting and     angioplasty in the past.  He has also had MI in the past. 2. History of CHF, both systolic and diastolic with EF of about 45%     based on an echo that was done in May of this year. 3. He has history of diabetes, which he says is reducing, which is     getting better ever since his ex-lap procedure. 4. He has a history of pulmonary  embolism in the past.  However, he     had some bleeding issues with Coumadin requiring stay in the ICU     and does not want to take Coumadin if he can help it. 5. History of venous insufficiency. 6. History of CVA with left arm weakness. 7. History of COPD.  PAST SURGICAL HISTORY:  Lap band procedure about a few months ago.  SOCIAL HISTORY:  Lives in [place] with his wife.  Denies smoking currently, quit 10 years ago.  No alcohol use.  No illicit drug use.  FAMILY HISTORY:  Lung cancer and heart disease.  REVIEW OF SYSTEMS:  GENERAL:  Positive for weakness.  He has lost some weight ever since his lap band procedure.  HEENT:  Unremarkable. CARDIOVASCULAR:  As in HPI.  RESPIRATORY:  As in HPI.  GI:  As in HPI. GU:  Unremarkable.  NEUROLOGIC:  Unremarkable.  PSYCHIATRIC: Unremarkable.  MUSCULOSKELETAL:  As in HPI.  Other systems reviewed and found to be negative.  PHYSICAL EXAMINATION:  VITAL SIGNS:  Temperature 98.4, blood pressure 143/57, heart rate 70, respiratory rate 16, and saturation 99% on room air. GENERA:  Still obese white male, in no distress. HEENT:  Head is normocephalic and atraumatic.  Pupils are equal andreacting.  No pallor.  No icterus.  Mucous membrane is moist.  No oral lesions are noted. NECK:  Soft and supple.  No thyromegaly is appreciated.  No cervical, supraclavicular, or inguinal lymphadenopathy is present. LUNGS:  Crackles at the bases bilaterally without any wheezing. CARDIOVASCULAR:  S1 and S2 normal.  Regular.  No S3 or S4.  No murmurs, rubs, or bruits.  No pedal edema. ABDOMEN:  Distended.  Tenderness in the epigastric area is present without any rebound, rigidity, or guarding.  There is no masses or organomegaly. GU:  Deferred. MUSCULOSKELETAL:  Normal muscle mass and tone.  He does have a bruit on the right lower extremity that was placed about a week ago when his fractures were detected. NEUROLOGIC:  He is alert and oriented x3.  No focal  neurological deficits are present. SKIN:  Does not reveal any rashes.  LABORATORY DATA:  His CBC shows a white count of 12.2 with 84% neutrophils.  INR and PT is normal.  Hemoglobin is normal.  Sodium is 134, chloride is 95, glucose is 209, BUN is 37, and creatinine is 1.3. LFTs are normal.  Troponin 0.01.  He had a chest x-ray, which showed no convincing pulmonary edema, hazy left basilar atelectasis or infiltrate.  CT angio was negative for pulmonary emboli, no other acute issues were seen.  He had an EKG which showed sinus rhythm with normal axis, intervals appear to be in the normal  range, no concerning ST or T-wave changes are noted on this EKG.  EKG compared to one from June and no significant new changes are noted.  ASSESSMENT:  This is a 70 year old Caucasian male with a past medical history as stated earlier, who presents with left-sided chest pain and some arm pain.  Considering history of coronary artery disease, this could be angina.  We will admit the patient to the hospital and rule him out for acute coronary syndrome.  PLAN: 1. Chest pain, rule out ACS by serial cardiac enzymes.  EKG will be     repeated in the morning.  We will go ahead and consult his     cardiologist to take a look at him in the morning to see if any     further workup needs to be done. 2. History of CHF, chronic diastolic and systolic, stable.  He does     have some crackles in the lung.  I am going to give him a few doses     of IV Lasix and then resume his p.o. Lasix. 3. Abdominal bloatedness.  We will check a lipase level.  His LFTs are     normal.  We will proceed with an acute abdominal series to make     sure there is no obstruction. 4. History of diabetes.  Continue with his insulin regimen.  Hb A1c     will be checked.  Rest of his medical issues are stable.  We will give him a PPI.  DVT prophylaxis will be initiated.  Further management decisions will depend on results of  further testing and the patient's response to treatment.  Osvaldo Shipper, MD     GK/MEDQ  D:  10/21/2011  T:  10/22/2011  Job:  [id number]  cc:   Verne Carrow, MD Cleophas Dunker Everardo All, MD  Electronically Signed by Osvaldo Shipper MD on 10/22/2011 06:57:17 PM

## 2011-10-23 ENCOUNTER — Encounter: Payer: Self-pay | Admitting: Pulmonary Disease

## 2011-10-23 ENCOUNTER — Ambulatory Visit (INDEPENDENT_AMBULATORY_CARE_PROVIDER_SITE_OTHER): Payer: Medicare Other | Admitting: Pulmonary Disease

## 2011-10-23 VITALS — BP 136/60 | HR 69 | Temp 98.1°F | Ht 63.0 in | Wt 214.0 lb

## 2011-10-23 DIAGNOSIS — J4489 Other specified chronic obstructive pulmonary disease: Secondary | ICD-10-CM

## 2011-10-23 DIAGNOSIS — J449 Chronic obstructive pulmonary disease, unspecified: Secondary | ICD-10-CM

## 2011-10-23 NOTE — Patient Instructions (Signed)
No change in your breathing meds. Work on conditioning and weight loss as much as you can followup with me in 6mos.

## 2011-10-23 NOTE — Discharge Summary (Signed)
[name], HANE NO.:  [id number]  MEDICAL RECORD NO.:  [id number]  LOCATION:  4733                         FACILITY:  [facility]  PHYSICIAN:  Peggye Pitt, M.D. DATE OF BIRTH:  25-Dec-1940  DATE OF ADMISSION:  10/21/2011 DATE OF DISCHARGE:  10/22/2011                              DISCHARGE SUMMARY   PRIMARY CARE PHYSICIAN:  Sean A. Everardo All, MD  CARDIOLOGIST:  Verne Carrow, MD with [facility].  DISCHARGE DIAGNOSES: 1. Chest pain, ruled out for acute coronary syndrome. 2. History of coronary artery disease. 3. Chronic systolic and diastolic congestive heart failure. 4. Insulin-dependent diabetes. 5. Hypertension. 6. History of chronic obstructive pulmonary disease.  DISCHARGE MEDICATIONS: 1. Flexeril 5 mg at bedtime as needed for muscle spasms. 2. Albuterol inhaler 2 puffs every 6 hours as needed for shortness of     breath. 3. Allopurinol 300 mg daily. 4. Astepro 0.15% 2 sprays twice daily as needed for nasal congestion. 5. Aspirin 81 mg daily. 6. Bisoprolol 10 mg daily. 7. Calcitriol 0.25 mcg daily. 8. Crestor 10 mg at bedtime. 9. Diltiazem 180 mg daily. 10.Ferrous sulfate 325 mg twice daily. 11.Foltx 1 tab daily. 12.Lasix 80 mg to take half a tablet twice daily. 13.Finasteride 5 mg daily. 14.Vicodin 5/500 mg 1-2 tablets every 4 hours as needed for pain. 15.Imdur 60 mg 1 tablet daily. 16.MiraLAX 17 g daily as needed for constipation. 17.Multivitamin 1 tablet daily. 18.Nitroglycerin spray every 5 minutes up to 3 doses as needed for     chest pain. 19.NPH insulin 10 units at bedtime. 20.NovoLog 8-15 units in a sliding scale as indicated by primary care     physician 3 times a day with meals. 21.Omeprazole 20 mg daily. 22.Spiriva 18 mcg inhaled daily. 23.Symbicort 160/4.5 mcg 2 puffs twice daily. 24.Senokot 2 tablets daily. 25.Solgar solution 30 mL daily. 26.Trazodone 100 mg at bedtime. 27.Triamcinolone cream 0.025% one  application 3 times daily as needed     for rash.  DISPOSITION AND FOLLOWUP:  [name] will be discharged home today in stable and improved condition.  He has been instructed to secure followup appointment with his primary care physician in 3-4 weeks for followup or sooner if needed for chest pain.  CONSULTATION THIS HOSPITALIZATION:  Bevelyn Buckles. Bensimhon, MD with Cardiology.  IMAGES AND PROCEDURES:  A chest x-ray on October 21, 2011, that showed no constrictive pulmonary edema with hazy left basilar atelectasis or infiltrates.  A CT angiogram of the chest on October 21, 2011, that was negative for PE with no acute abnormalities.  An acute abdominal series on October 22, 2011, that showed edema of the lung bases, mild gaseous distention of the stomach.  No small or large bowel obstruction.  HISTORY AND PHYSICAL:  For full details, please refer to dictation on October 21, 2011, by Dr. Rito Ehrlich, however, in brief [name] is a pleasant 70 year old gentleman admitted to the hospital for chest pain that started after eating yogurt in the morning.  He had, had some burgers the night prior which he usually does not do.  Because of this, we are asked to admit him for further evaluation and management.  HOSPITAL COURSE BY PROBLEM: 1.  Chest pain.  He has now ruled out for acute coronary syndrome by     the way of 3 sets of negative cardiac enzymes and EKGs that     demonstrate no acute ST or T-wave abnormalities.  We believe at     this point that his chest pain is more GI in nature and Cardiology     agrees.  The fact that this started after eating and the fact that     the night prior he had, had some meals that he usually does not     have especially after his gastric bypass procedure and the addition     of the fact that he has been belching a lot would seem to     corroborate this story.  He has been instructed to continue taking     his omeprazole daily first thing in the  morning.  He has also been     instructed to follow his bariatric diet. 2. All rest of chronic conditions are stable.  His home medications     have not been altered.  VITALS ON DAY OF DISCHARGE:  Blood pressure 160/69, heart rate 72, respirations 20, sats are 96% on room air, temp 98.9.     Peggye Pitt, M.D.     EH/MEDQ  D:  10/22/2011  T:  10/22/2011  Job:  [id number]  cc:   Gregary Signs A. Everardo All, MD Verne Carrow, MD Bevelyn Buckles. Bensimhon, MD  Electronically Signed by Peggye Pitt M.D. on 10/23/2011 95:62:13 PM

## 2011-10-23 NOTE — Progress Notes (Signed)
  Subjective:    Patient ID: [name], male    DOB: [birth year], 70 y.o.   MRN: [id number]  HPI The patient comes in today for followup of his known moderate emphysema.  He has been staying on his usual bronchodilator regimen, and has not had an acute exacerbation or excessive rescue inhaler use since his last visit.  He has not had any significant cough or chest congestion.  He was in the hospital recently for atypical chest pain, and a CT chest showed no PE or acute process.  Chest x-ray did show some mild interstitial edema.   Review of Systems  Constitutional: Negative for fever and unexpected weight change.  HENT: Negative for ear pain, nosebleeds, congestion, sore throat, rhinorrhea, sneezing, trouble swallowing, dental problem, postnasal drip and sinus pressure.   Eyes: Negative for redness and itching.  Respiratory: Positive for shortness of breath and wheezing. Negative for cough and chest tightness.   Cardiovascular: Positive for leg swelling. Negative for palpitations.  Gastrointestinal: Positive for nausea. Negative for vomiting.  Genitourinary: Negative for dysuria.  Musculoskeletal: Negative for joint swelling.  Skin: Negative for rash.  Neurological: Negative for headaches.  Hematological: Does not bruise/bleed easily.  Psychiatric/Behavioral: Negative for dysphoric mood. The patient is not nervous/anxious.        Objective:   Physical Exam Obese male in no acute distress Nose without purulence or discharge noted Chest with mild basilar crackles, no wheezes or rhonchi Cardiac exam with regular rate and rhythm Lower extremities with mild edema, no cyanosis noted Alert and oriented, moves all 4 extremities.       Assessment & Plan:

## 2011-10-23 NOTE — Assessment & Plan Note (Signed)
The patient has known mild to moderate COPD by his pulmonary function studies, and has really done well from a pulmonary standpoint on his current bronchodilator regimen.  He has not had a recent acute exacerbation or pulmonary infection, and has not required his rescue inhaler.  I have asked him to continue on his current meds, and to work on conditioning and weight loss.

## 2011-10-25 ENCOUNTER — Encounter (INDEPENDENT_AMBULATORY_CARE_PROVIDER_SITE_OTHER): Payer: Medicare Other | Admitting: Surgery

## 2011-10-26 ENCOUNTER — Ambulatory Visit (INDEPENDENT_AMBULATORY_CARE_PROVIDER_SITE_OTHER): Payer: Medicare Other | Admitting: Endocrinology

## 2011-10-26 ENCOUNTER — Encounter: Payer: Self-pay | Admitting: Endocrinology

## 2011-10-26 DIAGNOSIS — E1129 Type 2 diabetes mellitus with other diabetic kidney complication: Secondary | ICD-10-CM

## 2011-10-26 DIAGNOSIS — E785 Hyperlipidemia, unspecified: Secondary | ICD-10-CM

## 2011-10-26 DIAGNOSIS — E109 Type 1 diabetes mellitus without complications: Secondary | ICD-10-CM

## 2011-10-26 NOTE — Patient Instructions (Addendum)
check your blood sugar 2 times a day.  vary the time of day when you check, between before the 3 meals, and at bedtime.  also check if you have symptoms of your blood sugar being too high or too low.  please keep a record of the readings and bring it to your next appointment here.  please call us sooner if you are having low blood sugar episodes, or if it stays over 200.   Please go back to see dr Ezzard Standing.   If he adjusts the gastric band, please reduce each insulin injection by 5 units.  Call if you have more questions about your blood sugar, as your insulin needs may decrease more. Please see dr Clifton James soon. i'll request an appointment for you to have an infusion for osteoporosis.   please consider these measures for your health:  minimize alcohol.  do not use tobacco products.  have a colonoscopy at least every 10 years from age 42.  keep firearms safely stored.  always use seat belts.  have working smoke alarms in your home.  see an eye doctor and dentist regularly.  never drive under the influence of alcohol or drugs (including prescription drugs).  those with fair skin should take precautions against the sun. please let me know what your wishes would be, if artificial life support measures should become necessary.  it is critically important to prevent falling down (keep floor areas well-lit, dry, and free of loose objects.  If you have a cane, walker, or wheelchair, you should use it, even for short trips around the house.  Also, try not to rush) Please come back for a follow-up appointment in 3 months. You should have a vaccine against shingles (a painful rash which results from the  chickenpox infection which most people had many years ago).  This vaccine reduces, but does not totally eliminate the risk of shingles.  Because this is a medicare part d benefit, there are 3 ways you can get it:  You can go to a pharmacy and get the injection (I can give you a prescription), or I can give you a  prescription to have filled at a pharmacy, and bring back here for [place] to give.  The other option is that you can pay up-front for it, and we'll give you a form to make a claim for reimbursement from your medicare part d carrier. (update: i left message on phone-tree:  rx as we discussed)

## 2011-10-26 NOTE — Progress Notes (Signed)
Subjective:    Patient ID: [name], male    DOB: 1941/07/13, 70 y.o.   MRN: [id number]  HPI The state of at least three ongoing medical problems is addressed today: DM: no cbg record, but states cbg's are well-controlled.  He says his weight has leveled-off.   HTN: he was recently hospitalized for chest pain.  sxs are resolved.   Dyslipidemia: He saw podiatry recently for fx of a bone of the right foot.  This limits his exercise rx. Past Medical History  Diagnosis Date  . Allergic rhinitis   . COPD (chronic obstructive pulmonary disease)     mild to moderate by pfts in 2006  . HLD (hyperlipidemia)   . Impotence   . PVD (peripheral vascular disease)   . Type II or unspecified type diabetes mellitus with renal manifestations, not stated as uncontrolled   . Renal insufficiency   . Osteoarthritis   . Cough     due to Zestril  . Encounter for long-term (current) use of aspirin   . Encounter for long-term (current) use of antiplatelets/antithrombotics   . Essential hypertension, benign   . Chronic diastolic heart failure   . Coronary atherosclerosis of native coronary artery   . Shortness of breath   . Other emphysema   . Edema   . Special screening for malignant neoplasm of prostate   . Conjunctivitis unspecified   . Hypopotassemia   . Osteoporosis, unspecified   . Secondary hyperparathyroidism (of renal origin)   . Gout, unspecified   . Pneumonia, organism unspecified   . 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 Hemiplegia affecting unspecified side, late effect of cerebrovascular disease   . Headache   . Other and unspecified hyperlipidemia   . Nephropathy, diabetic     Past Surgical History  Procedure Date  . Nasal sinus surgery   . Esophagogastroduodenoscopy   . Cardiac catheterization 01/18/2005  . Abdominal surgery   . Laparoscopic gastric banding 05/29/2011    History   Social History  .  Marital Status: Married    Spouse Name: N/A    Number of Children: Y  . Years of Education: N/A   Occupational History  .  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     Social History Main Topics  . Smoking status: Former Smoker -- 2.0 packs/day for 41 years    Types: Cigarettes    Quit date: 12/24/1997  . Smokeless tobacco: Former Neurosurgeon  . Alcohol Use: No  . Drug Use: No  . Sexually Active: Not on file   Other Topics Concern  . Not on file   Social History Narrative  . No narrative on file    Current Outpatient Prescriptions on File Prior to Visit  Medication Sig Dispense Refill  . albuterol (VENTOLIN HFA) 108 (90 BASE) MCG/ACT inhaler Inhale 2 puffs into the lungs every 6 (six) hours as needed.        Marland Kitchen allopurinol (ZYLOPRIM) 300 MG tablet TAKE 1 TABLET BY MOUTH DAILY  30 tablet  11  . aspirin 81 MG tablet Take 81 mg by mouth daily.        . Azelastine HCl (ASTEPRO) 0.15 % SOLN 2 sprays by Nasal route 2 (two) times daily as needed.        . bisoprolol (ZEBETA) 5 MG tablet Take 1 tablet (5 mg total) by mouth daily.  90 tablet  1  . budesonide-formoterol (SYMBICORT) 160-4.5  MCG/ACT inhaler Inhale 2 puffs into the lungs 2 (two) times daily.        . calcitRIOL (ROCALTROL) 0.25 MCG capsule TAKE 1 CAPSULE BY MOUTH DAILY  90 capsule  3  . CRESTOR 10 MG tablet TAKE 1 TABLET BY MOUTH ONCE DAILY  30 tablet  11  . cyclobenzaprine (FLEXERIL) 5 MG tablet Take 5 mg by mouth at bedtime as needed.        . ferrous sulfate 325 (65 FE) MG tablet Take 325 mg by mouth 3 (three) times daily.        . finasteride (PROSCAR) 5 MG tablet Take 1 tablet (5 mg total) by mouth daily.  30 tablet  11  . folic acid-pyridoxine-cyancobalamin (FOLBIC) 2.5-25-2 MG TABS Take 1 tablet by mouth daily.        . furosemide (LASIX) 40 MG tablet Take 40 mg by mouth 2 (two) times daily.        Marland Kitchen glucose blood test strip Four times a day, variable glucoses dx 250.03      . HYDROcodone-acetaminophen (VICODIN) 5-500 MG per tablet Take 1-2  tablets by mouth every 4 (four) hours as needed.  100 tablet  5  . insulin aspart (NOVOLOG) 100 UNIT/ML injection Inject into the skin 3 (three) times daily before meals. 3x a day (just before each meal) 10-02-09 units      . insulin NPH (HUMULIN N) 100 UNIT/ML injection Inject 10 Units into the skin at bedtime.       . INSULIN SYRINGE 1CC/29G (B-D INSULIN SYRINGE) 29G X 1/2" 1 ML MISC 4 (four) times daily. Dx 250.03      . isosorbide mononitrate (IMDUR) 60 MG 24 hr tablet Take 1 tablet (60 mg total) by mouth daily.  30 tablet  11  . nitroGLYCERIN (NITROLINGUAL) 0.4 MG/SPRAY spray Place 1 spray under the tongue as directed.        Marland Kitchen omeprazole (PRILOSEC) 20 MG capsule Take 20 mg by mouth daily.        . polyethylene glycol powder (MIRALAX) powder Take 17 g by mouth as needed.        Marland Kitchen TAZTIA XT 180 MG 24 hr capsule TAKE ONE CAPSULE BY MOUTH DAILY  90 capsule  3  . tiotropium (SPIRIVA) 18 MCG inhalation capsule Place 18 mcg into inhaler and inhale daily.       . traZODone (DESYREL) 100 MG tablet TAKE 1 TABLET BY MOUTH EVERY NIGHT AT BEDTIME  90 tablet  2  . triamcinolone (KENALOG) 0.025 % cream APPLY THREE TIMES DAILY ON AFFECTED AREA AS NEEDED  80 g  0    Allergies  Allergen Reactions  . Enalapril Maleate     REACTION: cough  . Shellfish-Derived Products Swelling    Family History  Problem Relation Age of Onset  . Hypertension      family history of htn  . Heart disease Maternal Aunt   . Lung cancer Mother   . Cancer Mother     Lung Cancer  . Heart disease Father     CHF    BP 132/64  Pulse 79  Temp(Src) 98.4 F (36.9 C) (Oral)  Ht 5\' 3"  (1.6 m)  Wt 215 lb (97.523 kg)  BMI 38.09 kg/m2  SpO2 93%  Review of Systems denies hypoglycemia and sob.    Objective:   Physical Exam VITAL SIGNS:  See vs page GENERAL: no distress Ext: no edema   Lab Results  Component Value Date  HGBA1C 6.8* 10/22/2011   Lab Results  Component Value Date   CHOL 133 10/22/2011   HDL 48  10/22/2011   LDLCALC 66 10/22/2011   TRIG 94 10/22/2011   CHOLHDL 2.8 10/22/2011       Assessment & Plan:  Dm, well-controlled.  However, he could develop hypoglycemia with further weight loss.   HTN, well-controlled Dyslipidema, well-controlled. New foot fx:  This limits exercise rx.      Subjective:   Patient here for Medicare annual wellness visit and management of other chronic and acute problems.     Risk factors: advanced age    Roster of Physicians Providing Medical Care to Patient:  Opthal: gould Derm: gould Pulm: clance Surg: newman Nephrol: powell Cardiol: mcalhany   Activities of Daily Living: In your present state of health, do you have any difficulty performing the following activities?:  Preparing food and eating?: No  Bathing yourself: No  Getting dressed: No  Using the toilet: No  Moving around from place to place:  No  In the past year have you fallen or had a near fall?: yes   Home Safety: Has smoke detector and wears seat belts. No firearms. No excess sun exposure.  Diet and Exercise  Current exercise habits:  Limited by health probs Dietary issues discussed: he reently had gastric banding  Depression Screen  Q1: Over the past two weeks, have you felt down, depressed or hopeless? no  Q2: Over the past two weeks, have you felt little interest or pleasure in doing things? no   The following portions of the patient's history were reviewed and updated as appropriate: allergies, current medications, past family history, past medical history, past social history, past surgical history and problem list.  Past Medical History  Diagnosis Date  . Allergic rhinitis   . COPD (chronic obstructive pulmonary disease)     mild to moderate by pfts in 2006  . HLD (hyperlipidemia)   . Impotence   . PVD (peripheral vascular disease)   . Type II or unspecified type diabetes mellitus with renal manifestations, not stated as uncontrolled   . Renal insufficiency    . Osteoarthritis   . Cough     due to Zestril  . Encounter for long-term (current) use of aspirin   . Encounter for long-term (current) use of antiplatelets/antithrombotics   . Essential hypertension, benign   . Chronic diastolic heart failure   . Coronary atherosclerosis of native coronary artery   . Shortness of breath   . Other emphysema   . Edema   . Special screening for malignant neoplasm of prostate   . Conjunctivitis unspecified   . Hypopotassemia   . Osteoporosis, unspecified   . Secondary hyperparathyroidism (of renal origin)   . Gout, unspecified   . Pneumonia, organism unspecified   . 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 Hemiplegia affecting unspecified side, late effect of cerebrovascular disease   . Headache   . Other and unspecified hyperlipidemia   . Nephropathy, diabetic     Past Surgical History  Procedure Date  . Nasal sinus surgery   . Esophagogastroduodenoscopy   . Cardiac catheterization 01/18/2005  . Abdominal surgery   . Laparoscopic gastric banding 05/29/2011    History   Social History  . Marital Status: Married    Spouse Name: N/A    Number of Children: Y  . Years of Education: N/A   Occupational History  .  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     Social History Main Topics  . Smoking status: Former Smoker -- 2.0 packs/day for 41 years    Types: Cigarettes    Quit date: 12/24/1997  . Smokeless tobacco: Former Neurosurgeon  . Alcohol Use: No  . Drug Use: No  . Sexually Active: Not on file   Other Topics Concern  . Not on file   Social History Narrative  . No narrative on file    Current Outpatient Prescriptions on File Prior to Visit  Medication Sig Dispense Refill  . albuterol (VENTOLIN HFA) 108 (90 BASE) MCG/ACT inhaler Inhale 2 puffs into the lungs every 6 (six) hours as needed.        Marland Kitchen allopurinol (ZYLOPRIM) 300 MG tablet TAKE 1 TABLET BY MOUTH DAILY  30 tablet  11  . aspirin  81 MG tablet Take 81 mg by mouth daily.        . Azelastine HCl (ASTEPRO) 0.15 % SOLN 2 sprays by Nasal route 2 (two) times daily as needed.        . bisoprolol (ZEBETA) 5 MG tablet Take 1 tablet (5 mg total) by mouth daily.  90 tablet  1  . budesonide-formoterol (SYMBICORT) 160-4.5 MCG/ACT inhaler Inhale 2 puffs into the lungs 2 (two) times daily.        . calcitRIOL (ROCALTROL) 0.25 MCG capsule TAKE 1 CAPSULE BY MOUTH DAILY  90 capsule  3  . CRESTOR 10 MG tablet TAKE 1 TABLET BY MOUTH ONCE DAILY  30 tablet  11  . cyclobenzaprine (FLEXERIL) 5 MG tablet Take 5 mg by mouth at bedtime as needed.        . ferrous sulfate 325 (65 FE) MG tablet Take 325 mg by mouth 3 (three) times daily.        . finasteride (PROSCAR) 5 MG tablet Take 1 tablet (5 mg total) by mouth daily.  30 tablet  11  . folic acid-pyridoxine-cyancobalamin (FOLBIC) 2.5-25-2 MG TABS Take 1 tablet by mouth daily.        . furosemide (LASIX) 40 MG tablet Take 40 mg by mouth 2 (two) times daily.        Marland Kitchen glucose blood test strip Four times a day, variable glucoses dx 250.03      . HYDROcodone-acetaminophen (VICODIN) 5-500 MG per tablet Take 1-2 tablets by mouth every 4 (four) hours as needed.  100 tablet  5  . insulin aspart (NOVOLOG) 100 UNIT/ML injection Inject into the skin 3 (three) times daily before meals. 3x a day (just before each meal) 10-02-09 units      . insulin NPH (HUMULIN N) 100 UNIT/ML injection Inject 10 Units into the skin at bedtime.       . INSULIN SYRINGE 1CC/29G (B-D INSULIN SYRINGE) 29G X 1/2" 1 ML MISC 4 (four) times daily. Dx 250.03      . isosorbide mononitrate (IMDUR) 60 MG 24 hr tablet Take 1 tablet (60 mg total) by mouth daily.  30 tablet  11  . nitroGLYCERIN (NITROLINGUAL) 0.4 MG/SPRAY spray Place 1 spray under the tongue as directed.        Marland Kitchen omeprazole (PRILOSEC) 20 MG capsule Take 20 mg by mouth daily.        . polyethylene glycol powder (MIRALAX) powder Take 17 g by mouth as needed.        Marland Kitchen TAZTIA XT  180 MG 24 hr capsule TAKE ONE CAPSULE BY MOUTH DAILY  90 capsule  3  . tiotropium (SPIRIVA) 18 MCG inhalation capsule Place 18 mcg into inhaler and inhale daily.       . traZODone (DESYREL) 100 MG tablet TAKE 1 TABLET BY MOUTH EVERY NIGHT AT BEDTIME  90 tablet  2  . triamcinolone (KENALOG) 0.025 % cream APPLY THREE TIMES DAILY ON AFFECTED AREA AS NEEDED  80 g  0    Allergies  Allergen Reactions  . Enalapril Maleate     REACTION: cough  . Shellfish-Derived Products Swelling    Family History  Problem Relation Age of Onset  . Hypertension      family history of htn  . Heart disease Maternal Aunt   . Lung cancer Mother   . Cancer Mother     Lung Cancer  . Heart disease Father     CHF    BP 132/64  Pulse 79  Temp(Src) 98.4 F (36.9 C) (Oral)  Ht 5\' 3"  (1.6 m)  Wt 215 lb (97.523 kg)  BMI 38.09 kg/m2  SpO2 93%   Review of Systems  Denies hearing loss, and visual loss Objective:   Vision:  Sees opthalmologist Hearing: grossly normal Body mass index:  See vs page Msk: pt slowly performs "get-up-and-go" from a sitting position, due to right leg boot Cognitive Impairment Assessment: cognition, memory and judgment appear normal.  remembers 3/3 at 5 minutes.  excellent recall.  can easily read and write a sentence.  alert and oriented x 3   Assessment:   Medicare wellness utd on preventive parameters    Plan:   During the course of the visit the patient was educated and counseled about appropriate screening and preventive services including:        Fall prevention   Screening mammography  Bone densitometry screening  Diabetes screening  Nutrition counseling   Vaccines / LABS Zostavax / Pnemonccoal Vaccine  today  PSA  Patient Instructions (the written plan) was given to the patient.

## 2011-10-31 ENCOUNTER — Encounter: Payer: Self-pay | Admitting: Endocrinology

## 2011-10-31 ENCOUNTER — Telehealth: Payer: Self-pay | Admitting: *Deleted

## 2011-10-31 DIAGNOSIS — K219 Gastro-esophageal reflux disease without esophagitis: Secondary | ICD-10-CM | POA: Insufficient documentation

## 2011-10-31 NOTE — Telephone Encounter (Signed)
Pt informed of Reclast appt 11/19/2011 at 11:00am.

## 2011-11-01 ENCOUNTER — Ambulatory Visit: Payer: Medicare Other | Admitting: Physician Assistant

## 2011-11-01 ENCOUNTER — Other Ambulatory Visit (HOSPITAL_COMMUNITY): Payer: Self-pay | Admitting: *Deleted

## 2011-11-09 ENCOUNTER — Encounter: Payer: Self-pay | Admitting: Cardiovascular Disease

## 2011-11-09 ENCOUNTER — Ambulatory Visit (INDEPENDENT_AMBULATORY_CARE_PROVIDER_SITE_OTHER): Payer: Medicare Other | Admitting: Cardiovascular Disease

## 2011-11-09 VITALS — BP 154/70 | HR 72 | Ht 63.0 in | Wt 220.0 lb

## 2011-11-09 DIAGNOSIS — R0602 Shortness of breath: Secondary | ICD-10-CM

## 2011-11-09 DIAGNOSIS — I2510 Atherosclerotic heart disease of native coronary artery without angina pectoris: Secondary | ICD-10-CM

## 2011-11-09 DIAGNOSIS — I5033 Acute on chronic diastolic (congestive) heart failure: Secondary | ICD-10-CM

## 2011-11-09 LAB — BASIC METABOLIC PANEL
BUN: 32 mg/dL — ABNORMAL HIGH (ref 6–23)
CO2: 31 mEq/L (ref 19–32)
Chloride: 97 mEq/L (ref 96–112)
Creatinine, Ser: 1.1 mg/dL (ref 0.4–1.5)
Potassium: 4.4 mEq/L (ref 3.5–5.1)

## 2011-11-09 MED ORDER — FUROSEMIDE 80 MG PO TABS: 80.0000 mg | ORAL_TABLET | Freq: Two times a day (BID) | ORAL | Status: DC

## 2011-11-09 NOTE — Assessment & Plan Note (Signed)
Acute on chronic diastolic CHF. Will increase Lasix to 80 mg po BID. Check BMET and BNP today. I will see him back next week. If no improvement. He will need admission for diuresis. He is instructed to go into the ED if he has worsening over the weekend.

## 2011-11-09 NOTE — Patient Instructions (Addendum)
Your physician recommends that you schedule a follow-up appointment next week. --November 14, 2011 at 10:15  Your physician has recommended you make the following change in your medication: Increase furosemide to 80 mg by mouth twice daily.

## 2011-11-09 NOTE — Progress Notes (Signed)
History of Present Illness: 70 yo WM with h/o CAD, diastolic CHF, HTN,CRI, hyperlipidemia, PAD, DM here today for cardiac follow up. He has been followed in the past by Dr. [name]. I saw him for the first time in March 2012. He has had multiple prior PCI procedures. He has chronic diastolic heart failure. Stress myoview in February 2012 with no ischemia. This was done as part of the pre-operative workup before a planned lap band surgery.    He is here today for follow up. He tells me that he has been having more lower ext edema. He also describes SOB. No exertional chest pain. No dizziness, near syncope or syncope. He has been following a low salt diet. His weight has been stable but lower ext edema is increased.     Past Medical History  Diagnosis Date  . Allergic rhinitis   . COPD (chronic obstructive pulmonary disease)     mild to moderate by pfts in 2006  . HLD (hyperlipidemia)   . Impotence   . PVD (peripheral vascular disease)   . Type II or unspecified type diabetes mellitus with renal manifestations, not stated as uncontrolled   . Renal insufficiency   . Osteoarthritis   . Cough     due to Zestril  . Encounter for long-term (current) use of aspirin   . Encounter for long-term (current) use of antiplatelets/antithrombotics   . Essential hypertension, benign   . Chronic diastolic heart failure   . Coronary atherosclerosis of native coronary artery   . Shortness of breath   . Other emphysema   . Edema   . Special screening for malignant neoplasm of prostate   . Conjunctivitis unspecified   . Hypopotassemia   . Osteoporosis, unspecified   . Secondary hyperparathyroidism (of renal origin)   . Gout, unspecified   . Pneumonia, organism unspecified   . 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 Hemiplegia affecting unspecified side, late effect of cerebrovascular disease   . Headache   . Other and unspecified  hyperlipidemia   . Nephropathy, diabetic     Past Surgical History  Procedure Date  . Nasal sinus surgery   . Esophagogastroduodenoscopy   . Cardiac catheterization 01/18/2005  . Abdominal surgery   . Laparoscopic gastric banding 05/29/2011    Current Outpatient Prescriptions  Medication Sig Dispense Refill  . albuterol (VENTOLIN HFA) 108 (90 BASE) MCG/ACT inhaler Inhale 2 puffs into the lungs every 6 (six) hours as needed.        Marland Kitchen allopurinol (ZYLOPRIM) 300 MG tablet TAKE 1 TABLET BY MOUTH DAILY  30 tablet  11  . aspirin 81 MG tablet Take 81 mg by mouth daily.        . Azelastine HCl (ASTEPRO) 0.15 % SOLN 2 sprays by Nasal route 2 (two) times daily as needed.        . bisoprolol (ZEBETA) 5 MG tablet Take 1 tablet (5 mg total) by mouth daily.  90 tablet  1  . budesonide-formoterol (SYMBICORT) 160-4.5 MCG/ACT inhaler Inhale 2 puffs into the lungs 2 (two) times daily.        . calcitRIOL (ROCALTROL) 0.25 MCG capsule TAKE 1 CAPSULE BY MOUTH DAILY  90 capsule  3  . CRESTOR 10 MG tablet TAKE 1 TABLET BY MOUTH ONCE DAILY  30 tablet  11  . cyclobenzaprine (FLEXERIL) 5 MG tablet Take 5 mg by mouth at bedtime as needed.        Marland Kitchen  ferrous sulfate 325 (65 FE) MG tablet Take 325 mg by mouth 3 (three) times daily.        . finasteride (PROSCAR) 5 MG tablet Take 1 tablet (5 mg total) by mouth daily.  30 tablet  11  . folic acid-pyridoxine-cyancobalamin (FOLBIC) 2.5-25-2 MG TABS Take 1 tablet by mouth daily.        . furosemide (LASIX) 40 MG tablet Take 40 mg by mouth 2 (two) times daily.        Marland Kitchen glucose blood test strip Four times a day, variable glucoses dx 250.03      . HYDROcodone-acetaminophen (VICODIN) 5-500 MG per tablet Take 1-2 tablets by mouth every 4 (four) hours as needed.  100 tablet  5  . insulin aspart (NOVOLOG) 100 UNIT/ML injection Inject into the skin 3 (three) times daily before meals. 3x a day (just before each meal) 10-02-09 units      . insulin NPH (HUMULIN N) 100 UNIT/ML  injection Inject 10 Units into the skin at bedtime.       . INSULIN SYRINGE 1CC/29G (B-D INSULIN SYRINGE) 29G X 1/2" 1 ML MISC 4 (four) times daily. Dx 250.03      . isosorbide mononitrate (IMDUR) 60 MG 24 hr tablet Take 1 tablet (60 mg total) by mouth daily.  30 tablet  11  . nitroGLYCERIN (NITROLINGUAL) 0.4 MG/SPRAY spray Place 1 spray under the tongue as directed.        Marland Kitchen omeprazole (PRILOSEC) 20 MG capsule Take 20 mg by mouth daily.        . polyethylene glycol powder (MIRALAX) powder Take 17 g by mouth as needed.        Marland Kitchen TAZTIA XT 180 MG 24 hr capsule TAKE ONE CAPSULE BY MOUTH DAILY  90 capsule  3  . tiotropium (SPIRIVA) 18 MCG inhalation capsule Place 18 mcg into inhaler and inhale daily.       . traZODone (DESYREL) 100 MG tablet TAKE 1 TABLET BY MOUTH EVERY NIGHT AT BEDTIME  90 tablet  2  . triamcinolone (KENALOG) 0.025 % cream APPLY THREE TIMES DAILY ON AFFECTED AREA AS NEEDED  80 g  0    Allergies  Allergen Reactions  . Enalapril Maleate     REACTION: cough  . Shellfish-Derived Products Swelling    History   Social History  . Marital Status: Married    Spouse Name: N/A    Number of Children: Y  . Years of Education: N/A   Occupational History  .  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     Social History Main Topics  . Smoking status: Former Smoker -- 2.0 packs/day for 41 years    Types: Cigarettes    Quit date: 12/24/1997  . Smokeless tobacco: Former Neurosurgeon  . Alcohol Use: No  . Drug Use: No  . Sexually Active: Not on file   Other Topics Concern  . Not on file   Social History Narrative  . No narrative on file    Family History  Problem Relation Age of Onset  . Hypertension      family history of htn  . Heart disease Maternal Aunt   . Lung cancer Mother   . Cancer Mother     Lung Cancer  . Heart disease Father     CHF    Review of Systems:  As stated in the HPI and otherwise negative.   BP 154/70  Pulse 72  Ht 5\' 3"  (1.6 m)  Wt 220 lb (99.791 kg)  BMI 38.97  kg/m2  Physical Examination: General: Well developed, well nourished, NAD HEENT: OP clear, mucus membranes moist SKIN: warm, dry. No rashes. Neuro: No focal deficits Musculoskeletal: Muscle strength 5/5 all ext Psychiatric: Mood and affect normal Neck: No JVD, no carotid bruits, no thyromegaly, no lymphadenopathy. Lungs:Clear bilaterally, no wheezes, rhonci, crackles Cardiovascular: Regular rate and rhythm. No murmurs, gallops or rubs. Abdomen:Soft. Bowel sounds present. Non-tender.  Extremities: 2+ bilateral lower extremity edema. Pulses are 2 + in the bilateral DP/PT.   Echo May 08, 2011: Left ventricle: The cavity size was mildly dilated. Wall thickness     was normal. Systolic function was mildly reduced. The estimated     ejection fraction was in the range of 45% to 50%. Mild global     hypokinesis. Features are consistent with a pseudonormal left     ventricular filling pattern, with concomitant abnormal relaxation     and increased filling pressure (grade 2 diastolic dysfunction).     E/medial e' > 15 suggests LV end diastolic pressure at least 20     mmHg.   - Aortic valve: There was no stenosis.   - Mitral valve: Mildly to moderately calcified annulus. Trivial     regurgitation.   - Left atrium: The atrium was mildly to moderately dilated.   - Right ventricle: The cavity size was normal. Systolic function was     normal.   - Pulmonary arteries: No complete TR doppler jet so unable to     estimate PA systolic pressure.   - Inferior vena cava: The vessel was normal in size; the     respirophasic diameter changes were in the normal range (= 50%);     findings are consistent with normal central venous pressure.   Impressions:    - Mildly dilated LV with mild global hypokinesis, EF 45-50%.     Moderate diastolic dysfunction with evidence for elevated LV     filling pressure. Normal RV size and systolic function.

## 2011-11-12 ENCOUNTER — Encounter: Payer: Medicare Other | Attending: Surgery | Admitting: *Deleted

## 2011-11-12 ENCOUNTER — Encounter: Payer: Self-pay | Admitting: *Deleted

## 2011-11-12 DIAGNOSIS — Z09 Encounter for follow-up examination after completed treatment for conditions other than malignant neoplasm: Secondary | ICD-10-CM | POA: Insufficient documentation

## 2011-11-12 DIAGNOSIS — Z713 Dietary counseling and surveillance: Secondary | ICD-10-CM | POA: Insufficient documentation

## 2011-11-12 DIAGNOSIS — Z9884 Bariatric surgery status: Secondary | ICD-10-CM | POA: Insufficient documentation

## 2011-11-12 NOTE — Progress Notes (Signed)
  Follow-up visit: 6 Months Post-Operative LAGB Surgery  Medical Nutrition Therapy:  Appt start time: 1100 end time:  1130.  Assessment:  Primary concerns today: post-operative bariatric surgery nutrition management. [name] returns for his 6 month post-op LAGB nutrition follow-up. He notes good portion control with his band and has only had 1 band fill at this point. He notes that he has recently been hospitalized due to lower extremity edema, SOB, fluid in his lungs, and broken bones in his foot. He notes that he may have to be rehospitalization due to his fluid retention (pt reports this per his cardiologist).  Weight today: 214.2 Weight change: 31.8 lbs Total weight lost: 31.8 lbs  BMI: 38% Weight goal: 160-180 lbs Surgery date: 05/15/11 Start weight at [facility]: 238.6 lbs (09/25/10)  24-hr recall:  B (7-8 AM): protein shake, 1 egg OR 1 protein shake Snk (11 AM): yogurt cup   L (2 PM): sandwich on whole wheat bread (w/ low-sodium ham, cheese, mayo) Snk (PM): N/A  D (6 PM): 2 oz lean meat, beet, spinach Snk (9-10 PM): 7 crackers, 2 oz cheese  Fluid intake: water, crystal light, unsweetened tea, protein shake = 64 oz Estimated total protein intake: 60-80g   Medications: See updated medication list Supplementation: Taking regularly (no calcium citrate per MD)  CBG monitoring: Daily Average CBG per patient: No glucose log available; Pt reports glucose levels of 140 - 250 (fasting) Last patient reported A1c: 6.8%  Lab Results  Component Value Date   HGBA1C 6.8* 10/22/2011    Using straws: No Drinking while eating: No Hair loss: No Carbonated beverages: No N/V/D/C: Regurgitation occurs when he over eats or does not chew well.  Last Lap-Band fill: One fill; 9/12  Recent physical activity:  [name] has been very energetic and reports high activity until end of October when he had his episode with fluid retention and cardiac protein  Progress Towards Goal(s):  In  progress.   Nutritional Diagnosis:  [place]-3.3 Overweight/obesity As related to recent LAGB surgery.  As evidenced by pt following LAGB dietary guidelines for continued weight loss.    Intervention:  Nutrition education.  Monitoring/Evaluation:  Dietary intake, exercise, lap band fills, and body weight. Follow up in 3-6 months for 9-12 month post-op visit.

## 2011-11-12 NOTE — Patient Instructions (Signed)
Goals:  Follow Phase 3B: High Protein + Non-Starchy Vegetables  Eat 3-6 small meals/snacks, every 3-5 hrs  Increase lean protein foods to meet 15-30g goal  Increase fluid intake to 64oz +  Add 15 grams of carbohydrate (fruit, whole grain, starchy vegetable) with meals  Avoid drinking 15 minutes before, during and 30 minutes after eating  Aim for >30 min of physical activity daily

## 2011-11-14 ENCOUNTER — Encounter: Payer: Self-pay | Admitting: Cardiovascular Disease

## 2011-11-14 ENCOUNTER — Ambulatory Visit (INDEPENDENT_AMBULATORY_CARE_PROVIDER_SITE_OTHER): Payer: Medicare Other | Admitting: Cardiovascular Disease

## 2011-11-14 VITALS — BP 126/70 | HR 78 | Ht 63.0 in | Wt 213.0 lb

## 2011-11-14 DIAGNOSIS — I509 Heart failure, unspecified: Secondary | ICD-10-CM

## 2011-11-14 DIAGNOSIS — I5032 Chronic diastolic (congestive) heart failure: Secondary | ICD-10-CM

## 2011-11-14 MED ORDER — FUROSEMIDE 40 MG PO TABS: ORAL_TABLET | ORAL | Status: DC

## 2011-11-14 MED ORDER — SPIRONOLACTONE 25 MG PO TABS: 25.0000 mg | ORAL_TABLET | Freq: Every day | ORAL | Status: DC

## 2011-11-14 MED ORDER — METOLAZONE 2.5 MG PO TABS: ORAL_TABLET | ORAL | Status: DC

## 2011-11-14 NOTE — Patient Instructions (Signed)
Your physician recommends that you schedule a follow-up appointment in: 1 month  Your physician recommends that you return for lab work in: 1 week--BMP  Your physician has recommended you make the following change in your medication:  Change furosemide to 80 mg every AM and 40 mg every PM Resume zaroxolyn 2.5 mg by mouth on Monday, Wednesday and Friday. Resume spironolactone 25 mg by mouth daily.

## 2011-11-14 NOTE — Progress Notes (Signed)
History of Present Illness: 70 yo WM with h/o CAD, diastolic CHF, HTN,CRI, hyperlipidemia, PAD, DM here today for cardiac follow up. He has been followed in the past by Dr. [name]. I saw him for the first time in March 2012. He has had multiple prior PCI procedures. He has chronic diastolic heart failure. Stress myoview in February 2012 with no ischemia. This was done as part of the pre-operative workup before a planned lap band surgery. I saw him last week for evaluation of SOB and lower extremity edema.  He told  me that he had been having more lower ext edema. He also described SOB. No exertional chest pain. No dizziness, near syncope or syncope. He had been following a low salt diet. His weight had been stable but lower ext edema was increased. I increased his Lasix to 80 mg po BID and checked a BMET.   He is here today for follow up. Feeling better but still with orthopnea, LE edema. Weight still unchanged. No chest pain.   Past Medical History  Diagnosis Date  . Allergic rhinitis   . COPD (chronic obstructive pulmonary disease)     mild to moderate by pfts in 2006  . HLD (hyperlipidemia)   . Impotence   . PVD (peripheral vascular disease)   . Type II or unspecified type diabetes mellitus with renal manifestations, not stated as uncontrolled   . Renal insufficiency   . Osteoarthritis   . Cough     due to Zestril  . Encounter for long-term (current) use of aspirin   . Encounter for long-term (current) use of antiplatelets/antithrombotics   . Essential hypertension, benign   . Chronic diastolic heart failure   . Coronary atherosclerosis of native coronary artery   . Shortness of breath   . Other emphysema   . Edema   . Special screening for malignant neoplasm of prostate   . Conjunctivitis unspecified   . Hypopotassemia   . Osteoporosis, unspecified   . Secondary hyperparathyroidism (of renal origin)   . Gout, unspecified   . Pneumonia, organism unspecified   . Type I (juvenile  type) diabetes mellitus without mention of complication, not stated as uncontrolled   . Hemiplegia affecting unspecified side, late effect of cerebrovascular disease   . Headache   . Other and unspecified hyperlipidemia   . Nephropathy, diabetic     Past Surgical History  Procedure Date  . Nasal sinus surgery   . Esophagogastroduodenoscopy   . Cardiac catheterization 01/18/2005  . Abdominal surgery   . Laparoscopic gastric banding 05/29/2011    Current Outpatient Prescriptions  Medication Sig Dispense Refill  . albuterol (VENTOLIN HFA) 108 (90 BASE) MCG/ACT inhaler Inhale 2 puffs into the lungs every 6 (six) hours as needed.        Marland Kitchen allopurinol (ZYLOPRIM) 300 MG tablet TAKE 1 TABLET BY MOUTH DAILY  30 tablet  11  . aspirin 81 MG tablet Take 81 mg by mouth daily.        . Azelastine HCl (ASTEPRO) 0.15 % SOLN 2 sprays by Nasal route 2 (two) times daily as needed.        . bisoprolol (ZEBETA) 5 MG tablet Take 1 tablet (5 mg total) by mouth daily.  90 tablet  1  . budesonide-formoterol (SYMBICORT) 160-4.5 MCG/ACT inhaler Inhale 2 puffs into the lungs 2 (two) times daily.        . calcitRIOL (ROCALTROL) 0.25 MCG capsule TAKE 1 CAPSULE BY MOUTH DAILY  90 capsule  3  .  CRESTOR 10 MG tablet TAKE 1 TABLET BY MOUTH ONCE DAILY  30 tablet  11  . cyclobenzaprine (FLEXERIL) 5 MG tablet Take 5 mg by mouth at bedtime as needed.        . ferrous sulfate 325 (65 FE) MG tablet Take 325 mg by mouth 3 (three) times daily.        . finasteride (PROSCAR) 5 MG tablet Take 1 tablet (5 mg total) by mouth daily.  30 tablet  11  . folic acid-pyridoxine-cyancobalamin (FOLBIC) 2.5-25-2 MG TABS Take 1 tablet by mouth daily.        . furosemide (LASIX) 80 MG tablet Take 1 tablet (80 mg total) by mouth 2 (two) times daily.  60 tablet  6  . glucose blood test strip Four times a day, variable glucoses dx 250.03      . HYDROcodone-acetaminophen (VICODIN) 5-500 MG per tablet Take 1-2 tablets by mouth every 4 (four) hours  as needed.  100 tablet  5  . insulin aspart (NOVOLOG) 100 UNIT/ML injection Inject into the skin 3 (three) times daily before meals. 3x a day (just before each meal) 10-02-09 units      . insulin NPH (HUMULIN N) 100 UNIT/ML injection Inject 10 Units into the skin at bedtime.       . INSULIN SYRINGE 1CC/29G (B-D INSULIN SYRINGE) 29G X 1/2" 1 ML MISC 4 (four) times daily. Dx 250.03      . isosorbide mononitrate (IMDUR) 60 MG 24 hr tablet Take 1 tablet (60 mg total) by mouth daily.  30 tablet  11  . nitroGLYCERIN (NITROLINGUAL) 0.4 MG/SPRAY spray Place 1 spray under the tongue as directed.        Marland Kitchen omeprazole (PRILOSEC) 20 MG capsule Take 20 mg by mouth daily.        . polyethylene glycol powder (MIRALAX) powder Take 17 g by mouth as needed.        Marland Kitchen TAZTIA XT 180 MG 24 hr capsule TAKE ONE CAPSULE BY MOUTH DAILY  90 capsule  3  . tiotropium (SPIRIVA) 18 MCG inhalation capsule Place 18 mcg into inhaler and inhale daily.       . traZODone (DESYREL) 100 MG tablet TAKE 1 TABLET BY MOUTH EVERY NIGHT AT BEDTIME  90 tablet  2  . triamcinolone (KENALOG) 0.025 % cream APPLY THREE TIMES DAILY ON AFFECTED AREA AS NEEDED  80 g  0    Allergies  Allergen Reactions  . Enalapril Maleate     REACTION: cough  . Shellfish-Derived Products Swelling    History   Social History  . Marital Status: Married    Spouse Name: N/A    Number of Children: Y  . Years of Education: N/A   Occupational History  .  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     Social History Main Topics  . Smoking status: Former Smoker -- 2.0 packs/day for 41 years    Types: Cigarettes    Quit date: 12/24/1997  . Smokeless tobacco: Former Neurosurgeon  . Alcohol Use: No  . Drug Use: No  . Sexually Active: Not on file   Other Topics Concern  . Not on file   Social History Narrative  . No narrative on file    Family History  Problem Relation Age of Onset  . Hypertension      family history of htn  . Heart disease Maternal Aunt   . Lung cancer Mother    . Cancer Mother  Lung Cancer  . Heart disease Father     CHF    Review of Systems:  As stated in the HPI and otherwise negative.   BP 126/70  Pulse 78  Ht 5\' 3"  (1.6 m)  Wt 213 lb (96.616 kg)  BMI 37.73 kg/m2  Physical Examination: General: Well developed, well nourished, NAD HEENT: OP clear, mucus membranes moist SKIN: warm, dry. No rashes. Neuro: No focal deficits Musculoskeletal: Muscle strength 5/5 all ext Psychiatric: Mood and affect normal Neck: No JVD, no carotid bruits, no thyromegaly, no lymphadenopathy. Lungs:Clear bilaterally, no wheezes, rhonci, crackles Cardiovascular: Regular rate and rhythm. No murmurs, gallops or rubs. Abdomen:Soft. Bowel sounds present. Non-tender.  Extremities: 2 + lower extremity edema.

## 2011-11-14 NOTE — Assessment & Plan Note (Signed)
His weight remains up although he feels slightly better. Still with orthopnea. Will resume old regimen including aldactone 25 mg po qdaily, Zaroxolyn 2.5 mg po every Mon, Wed, Friday and Lasix 80 mg am, 40 mg pm. He does not wish to take potassium due to pills being large. Will eat a banana every day. Check  BMET one week. See him back 1 month. He will limit fluid intake, daily weights, elevate legs, compression hose.

## 2011-11-19 ENCOUNTER — Telehealth: Payer: Self-pay | Admitting: *Deleted

## 2011-11-19 ENCOUNTER — Encounter (HOSPITAL_COMMUNITY)
Admission: RE | Admit: 2011-11-19 | Discharge: 2011-11-19 | Disposition: A | Discharge status: Home / Self Care | Payer: Medicare Other | Source: Ambulatory Visit | Attending: Endocrinology | Admitting: Endocrinology

## 2011-11-19 DIAGNOSIS — M810 Age-related osteoporosis without current pathological fracture: Secondary | ICD-10-CM | POA: Insufficient documentation

## 2011-11-19 MED ORDER — SODIUM CHLORIDE 0.9 % IV SOLN
INTRAVENOUS | Status: AC
  Administered 2011-11-19: 12:00:00 via INTRAVENOUS

## 2011-11-19 MED ORDER — ZOLEDRONIC ACID 5 MG/100ML IV SOLN
5.0000 mg | INTRAVENOUS | Status: AC
  Administered 2011-11-19: 5 mg via INTRAVENOUS
  Filled 2011-11-19: qty 100

## 2011-11-19 NOTE — Telephone Encounter (Signed)
Ok to d/c vit-d

## 2011-11-19 NOTE — Telephone Encounter (Signed)
P/t's daughter informed

## 2011-11-19 NOTE — Telephone Encounter (Signed)
Pt's daughter called on behalf of pt. Pt had Reclast infusion today and they want to know if pt should start taking Vitamin D again because of infusion (per pt, pt was told to d/c Vitamin D per [facility])-please advise

## 2011-11-19 NOTE — Progress Notes (Signed)
Daughter jennifer here, she is on her cell phone asking if dr Everardo All wants pt to take vitamin d.  Pt states dr Everardo All took him off vitamin D few months ago.  She will receive  Instructions from call back and follow his instructions  accordingly

## 2011-11-22 ENCOUNTER — Other Ambulatory Visit: Payer: Self-pay | Admitting: *Deleted

## 2011-11-22 ENCOUNTER — Other Ambulatory Visit (INDEPENDENT_AMBULATORY_CARE_PROVIDER_SITE_OTHER): Payer: Medicare Other | Admitting: *Deleted

## 2011-11-22 DIAGNOSIS — I5032 Chronic diastolic (congestive) heart failure: Secondary | ICD-10-CM

## 2011-11-22 DIAGNOSIS — I509 Heart failure, unspecified: Secondary | ICD-10-CM

## 2011-11-22 LAB — BASIC METABOLIC PANEL
Calcium: 8.6 mg/dL (ref 8.4–10.5)
Creatinine, Ser: 1.8 mg/dL — ABNORMAL HIGH (ref 0.4–1.5)
GFR: 39.64 mL/min — ABNORMAL LOW (ref >60.00)

## 2011-11-26 ENCOUNTER — Other Ambulatory Visit: Payer: Self-pay | Admitting: *Deleted

## 2011-11-26 ENCOUNTER — Other Ambulatory Visit (INDEPENDENT_AMBULATORY_CARE_PROVIDER_SITE_OTHER): Payer: Medicare Other | Admitting: *Deleted

## 2011-11-26 DIAGNOSIS — I509 Heart failure, unspecified: Secondary | ICD-10-CM

## 2011-11-26 DIAGNOSIS — I5032 Chronic diastolic (congestive) heart failure: Secondary | ICD-10-CM

## 2011-11-26 LAB — BASIC METABOLIC PANEL
Calcium: 9.1 mg/dL (ref 8.4–10.5)
GFR: 35.33 mL/min — ABNORMAL LOW (ref >60.00)
Potassium: 4.3 mEq/L (ref 3.5–5.1)
Sodium: 133 mEq/L — ABNORMAL LOW (ref 135–145)

## 2011-11-29 ENCOUNTER — Ambulatory Visit (INDEPENDENT_AMBULATORY_CARE_PROVIDER_SITE_OTHER): Payer: Medicare Other | Admitting: Cardiovascular Disease

## 2011-11-29 ENCOUNTER — Encounter: Payer: Self-pay | Admitting: Cardiovascular Disease

## 2011-11-29 ENCOUNTER — Other Ambulatory Visit: Payer: Self-pay | Admitting: *Deleted

## 2011-11-29 VITALS — BP 119/57 | HR 71 | Ht 63.0 in | Wt 205.0 lb

## 2011-11-29 DIAGNOSIS — I509 Heart failure, unspecified: Secondary | ICD-10-CM

## 2011-11-29 DIAGNOSIS — I2510 Atherosclerotic heart disease of native coronary artery without angina pectoris: Secondary | ICD-10-CM

## 2011-11-29 DIAGNOSIS — I5032 Chronic diastolic (congestive) heart failure: Secondary | ICD-10-CM

## 2011-11-29 LAB — BASIC METABOLIC PANEL
CO2: 29 mEq/L (ref 19–32)
Chloride: 92 mEq/L — ABNORMAL LOW (ref 96–112)
Sodium: 132 mEq/L — ABNORMAL LOW (ref 135–145)

## 2011-11-29 MED ORDER — FUROSEMIDE 40 MG PO TABS: ORAL_TABLET | ORAL | Status: DC

## 2011-11-29 NOTE — Assessment & Plan Note (Signed)
Stable. No changes. Continue current therapy.  

## 2011-11-29 NOTE — Patient Instructions (Signed)
Your physician recommends that you schedule a follow-up appointment in:3 months with Dr. Lisette Grinder have been referred to the [facility].  You had lab work done today. We will call you with results

## 2011-11-29 NOTE — Assessment & Plan Note (Signed)
His volume is much better but with increased diuresis, his renal function has worsened. I will continue Lasix 80 mg po Qdaily for now. Check BMET today. Based on renal function, will adjust Lasix. I will refer him to the CHF clinic for assistance with management. Continue daily weights at home. Low salt diet.

## 2011-11-29 NOTE — Progress Notes (Signed)
History of Present Illness: 70 yo WM with h/o CAD, diastolic CHF, HTN,CRI, hyperlipidemia, PAD, DM here today for cardiac follow up. He has been followed in the past by Dr. [name]. I saw him for the first time in March 2012. He has had multiple prior PCI procedures. He has chronic diastolic heart failure. Stress myoview in February 2012 with no ischemia. This was done as part of the pre-operative workup before a planned lap band surgery. I have seen him twice in the last month for evaluation of SOB and lower extremity edema. He has been having more lower ext edema and SOB over the last few months.  No exertional chest pain. No dizziness, near syncope or syncope. He has been following a low salt diet. His weight had been stable but lower ext edema has increased. I have adjusted his Lasix and added back his Zaroxolyn and aldactone at doses that he had tolerated in the past. Unfortunately, his renal function has worsened with the higher doses of diuretics. F/U BMET 11/26/11 with BMET of 57 and creatinine of 2.0. We have held his Zaroxolyn and Aldactone.    I asked him to come in today for evaluation. He is feeling much better and is happy that he can now wear his shoes. His lower ext edema is improved. His weight is down 8 pounds.    Past Medical History  Diagnosis Date  . Allergic rhinitis   . COPD (chronic obstructive pulmonary disease)     mild to moderate by pfts in 2006  . HLD (hyperlipidemia)   . Impotence   . PVD (peripheral vascular disease)   . Type II or unspecified type diabetes mellitus with renal manifestations, not stated as uncontrolled   . Renal insufficiency   . Osteoarthritis   . Cough     due to Zestril  . Encounter for long-term (current) use of aspirin   . Encounter for long-term (current) use of antiplatelets/antithrombotics   . Essential hypertension, benign   . Chronic diastolic heart failure   . Coronary atherosclerosis of native coronary artery   . Shortness of  breath   . Other emphysema   . Edema   . Special screening for malignant neoplasm of prostate   . Conjunctivitis unspecified   . Hypopotassemia   . Osteoporosis, unspecified   . Secondary hyperparathyroidism (of renal origin)   . Gout, unspecified   . Pneumonia, organism unspecified   . 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 Hemiplegia affecting unspecified side, late effect of cerebrovascular disease   . Headache   . Other and unspecified hyperlipidemia   . Nephropathy, diabetic     Past Surgical History  Procedure Date  . Nasal sinus surgery   . Esophagogastroduodenoscopy   . Cardiac catheterization 01/18/2005  . Abdominal surgery   . Laparoscopic gastric banding 05/29/2011    Current Outpatient Prescriptions  Medication Sig Dispense Refill  . albuterol (VENTOLIN HFA) 108 (90 BASE) MCG/ACT inhaler Inhale 2 puffs into the lungs every 6 (six) hours as needed.        Marland Kitchen allopurinol (ZYLOPRIM) 300 MG tablet TAKE 1 TABLET BY MOUTH DAILY  30 tablet  11  . aspirin 81 MG tablet Take 81 mg by mouth daily.        . Azelastine HCl (ASTEPRO) 0.15 % SOLN 2 sprays by Nasal route 2 (two) times daily as needed.        . bisoprolol (ZEBETA)  5 MG tablet Take 1 tablet (5 mg total) by mouth daily.  90 tablet  1  . budesonide-formoterol (SYMBICORT) 160-4.5 MCG/ACT inhaler Inhale 2 puffs into the lungs 2 (two) times daily.        . calcitRIOL (ROCALTROL) 0.25 MCG capsule TAKE 1 CAPSULE BY MOUTH DAILY  90 capsule  3  . CRESTOR 10 MG tablet TAKE 1 TABLET BY MOUTH ONCE DAILY  30 tablet  11  . cyclobenzaprine (FLEXERIL) 5 MG tablet Take 5 mg by mouth at bedtime as needed.        . ferrous sulfate 325 (65 FE) MG tablet Take 325 mg by mouth 3 (three) times daily.        . finasteride (PROSCAR) 5 MG tablet Take 1 tablet (5 mg total) by mouth daily.  30 tablet  11  . folic acid-pyridoxine-cyancobalamin (FOLBIC) 2.5-25-2 MG TABS Take 1 tablet  by mouth daily.        . furosemide (LASIX) 40 MG tablet Take 2 tablets by mouth every morning       . glucose blood test strip Four times a day, variable glucoses dx 250.03      . insulin aspart (NOVOLOG) 100 UNIT/ML injection Inject into the skin 3 (three) times daily before meals. 3x a day (just before each meal) 10-02-09 units      . insulin NPH (HUMULIN N) 100 UNIT/ML injection Inject 10 Units into the skin at bedtime.       . INSULIN SYRINGE 1CC/29G (B-D INSULIN SYRINGE) 29G X 1/2" 1 ML MISC 4 (four) times daily. Dx 250.03      . isosorbide mononitrate (IMDUR) 60 MG 24 hr tablet Take 1 tablet (60 mg total) by mouth daily.  30 tablet  11  . nitroGLYCERIN (NITROLINGUAL) 0.4 MG/SPRAY spray Place 1 spray under the tongue as directed.        Marland Kitchen omeprazole (PRILOSEC) 20 MG capsule Take 20 mg by mouth daily.        . polyethylene glycol powder (MIRALAX) powder Take 17 g by mouth as needed.        Marland Kitchen TAZTIA XT 180 MG 24 hr capsule TAKE ONE CAPSULE BY MOUTH DAILY  90 capsule  3  . tiotropium (SPIRIVA) 18 MCG inhalation capsule Place 18 mcg into inhaler and inhale daily.       . traZODone (DESYREL) 100 MG tablet TAKE 1 TABLET BY MOUTH EVERY NIGHT AT BEDTIME  90 tablet  2  . triamcinolone (KENALOG) 0.025 % cream APPLY THREE TIMES DAILY ON AFFECTED AREA AS NEEDED  80 g  0  . HYDROcodone-acetaminophen (VICODIN) 5-500 MG per tablet Take 1-2 tablets by mouth every 4 (four) hours as needed.  100 tablet  5    Allergies  Allergen Reactions  . Enalapril Maleate     REACTION: cough  . Shellfish-Derived Products Swelling    History   Social History  . Marital Status: Married    Spouse Name: N/A    Number of Children: Y  . Years of Education: N/A   Occupational History  .  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     Social History Main Topics  . Smoking status: Former Smoker -- 2.0 packs/day for 41 years    Types: Cigarettes    Quit date: 12/24/1997  . Smokeless tobacco: Former Neurosurgeon  . Alcohol Use: No  . Drug  Use: No  . Sexually Active: Not on file   Other Topics Concern  . Not  on file   Social History Narrative  . No narrative on file    Family History  Problem Relation Age of Onset  . Hypertension      family history of htn  . Heart disease Maternal Aunt   . Lung cancer Mother   . Cancer Mother     Lung Cancer  . Heart disease Father     CHF    Review of Systems:  As stated in the HPI and otherwise negative.   BP 119/57  Pulse 71  Ht 5\' 3"  (1.6 m)  Wt 205 lb (92.987 kg)  BMI 36.31 kg/m2  Physical Examination: General: Well developed, well nourished, NAD HEENT: OP clear, mucus membranes moist SKIN: warm, dry. No rashes. Neuro: No focal deficits Musculoskeletal: Muscle strength 5/5 all ext Psychiatric: Mood and affect normal Neck: No JVD, no carotid bruits, no thyromegaly, no lymphadenopathy. Lungs:Clear bilaterally, no wheezes, rhonci, crackles Cardiovascular: Regular rate and rhythm. No murmurs, gallops or rubs. Abdomen:Soft. Bowel sounds present. Non-tender.  Extremities: No lower extremity edema. Pulses are 2 + in the bilateral DP/PT.  EKG:

## 2011-12-12 ENCOUNTER — Ambulatory Visit: Payer: Medicare Other | Admitting: Cardiovascular Disease

## 2011-12-14 ENCOUNTER — Encounter (HOSPITAL_COMMUNITY): Payer: Self-pay

## 2011-12-14 ENCOUNTER — Ambulatory Visit (HOSPITAL_COMMUNITY)
Admission: RE | Admit: 2011-12-14 | Discharge: 2011-12-14 | Disposition: A | Discharge status: Home / Self Care | Payer: Medicare Other | Source: Ambulatory Visit | Attending: Internal Medicine | Admitting: Internal Medicine

## 2011-12-14 VITALS — BP 142/52 | HR 69 | Wt 212.5 lb

## 2011-12-14 DIAGNOSIS — F32A Depression, unspecified: Secondary | ICD-10-CM | POA: Insufficient documentation

## 2011-12-14 DIAGNOSIS — I509 Heart failure, unspecified: Secondary | ICD-10-CM

## 2011-12-14 DIAGNOSIS — F329 Major depressive disorder, single episode, unspecified: Secondary | ICD-10-CM

## 2011-12-14 DIAGNOSIS — R5381 Other malaise: Secondary | ICD-10-CM | POA: Insufficient documentation

## 2011-12-14 DIAGNOSIS — F3289 Other specified depressive episodes: Secondary | ICD-10-CM | POA: Insufficient documentation

## 2011-12-14 DIAGNOSIS — M6250 Muscle wasting and atrophy, not elsewhere classified, unspecified site: Secondary | ICD-10-CM

## 2011-12-14 DIAGNOSIS — I5032 Chronic diastolic (congestive) heart failure: Secondary | ICD-10-CM | POA: Insufficient documentation

## 2011-12-14 MED ORDER — CITALOPRAM HYDROBROMIDE 10 MG PO TABS: 10.0000 mg | ORAL_TABLET | Freq: Every day | ORAL | Status: DC

## 2011-12-14 NOTE — Assessment & Plan Note (Signed)
Will have home PT/OT eval.

## 2011-12-14 NOTE — Assessment & Plan Note (Signed)
Start Celexa 10 daily.

## 2011-12-14 NOTE — Progress Notes (Signed)
HPI:  [name] is a 70 yo WM with h/o CAD, obesity s/p Lap Band June 2012, diastolic CHF (EF45-50%), HTN,CRI, hyperlipidemia, PAD, DM referred to HF clinic by Dr. Mcalhany for help with CHF management.  He has CAD with multiple prior PCI procedures. Stress myoview in February 2012 with no ischemia. This was done as part of the pre-operative workup before a planned lap band surgery. Recent echo with EF 45-50% + grade 2 diastolic dysfunction. RV normal  He has been followed closely by Dr. Mcalhany with careful attention to his weight and volume status.  His weight had been stable but lower ext edema has increased. Lasix was increased and also been treated with Zaroxolyn and aldactone at doses that he had tolerated in the past. Unfortunately, his renal function has worsened with the higher doses of diuretics. F/U BMET 11/26/11 with BMET of 57 and creatinine of 2.0. His Zaroxolyn and Aldactone have subsequently been held. Lasix currently at 80 mg daily in am and 40 mg every other afternoon. He says several years ago was taking lasix 240 bid + spiro and aldactone but diuretics cut back after Lap Band.   Continues to struggle. Lowest weight recently was 205 now 212.  Say he weights himself just about every day. Doesn't write it down. Says he has followed with Dr. Powell and Cr tends to run around 1.8. However in looking back over the summer Cr 1.0-1.3. More recently up in the 1.8-2.0. Over the summer fell and broke several bones and did rehab at [facility] for 5 weeks.   Says mobility very limited currently due to SOB. Says he can only walk 10-15 feet. Sleeps on couch straight up. Lives at home with wife and grandson. No CP. Wife cooks. Eats a lot of red meat. Says he doesn't use salt but doesn't know what amount of sodium to look out for. + edema. ab distension.   Somewhat depressed. Wife is about to have gastric bypass. 2 daughters with MS.   Review of Systems:     Cardiac Review of Systems: {Y] = yes [ ]  = no  Chest Pain [    ]  Resting SOB [ Y  ] Exertional SOB  [Y  ]  Orthopnea [Y  ]   Pedal Edema [ Y  ]    Palpitations [  ] Syncope  [  ]   Presyncope [   ]  General Review of Systems: [Y] = yes [  ]=no Constitional: recent weight change [  ]; anorexia [  ]; fatigue [ Y ]; nausea [  ]; night sweats [  ]; fever [  ]; or chills [  ];                                                                             Eye : blurred vision [  ]; diplopia [   ]; vision changes [  ];  Amaurosis fugax[  ]; Resp: cough [  ];  wheezing[  ];  hemoptysis[  ]; shortness of breath[Y  ]; paroxysmal nocturnal dyspnea[  ]; dyspnea on exertion[Y  ]; or orthopnea[ Y ];  GI:  gallstones[  ], vomiting[  ];  dysphagia[  ];   melena[  ];  hematochezia [  ]; heartburn[  ];   Hx of  Colonoscopy[  ]; GU: kidney stones [  ]; hematuria[  ];   dysuria [  ];  nocturia[  ];  history of     obstruction [  ];                 Skin: rash, swelling[  ];, hair loss[  ];  peripheral edema[  ];  or itching[  ]; Musculosketetal: myalgias[  ];  joint swelling[  ];  joint erythema[  ];  joint pain[Y  ];  back pain[Y  ];  Heme/Lymph: bruising[  ];  bleeding[  ];  anemia[  ];  Neuro: TIA[  ];  headaches[  ];  stroke[  ];  vertigo[  ];  seizures[  ];   paresthesias[  ];  difficulty walking[ Y ];  Psych:depression[ Y ]; anxiety[  ];  Endocrine: diabetes[Y  ];  thyroid dysfunction[  ]   Past Medical History  Diagnosis Date  . Allergic rhinitis   . COPD (chronic obstructive pulmonary disease)     mild to moderate by pfts in 2006  . HLD (hyperlipidemia)   . Impotence   . PVD (peripheral vascular disease)   . Type II or unspecified type diabetes mellitus with renal manifestations, not stated as uncontrolled   . Renal insufficiency   . Osteoarthritis   . Cough     due to Zestril  . Encounter for long-term (current) use of aspirin   . Encounter for long-term (current) use of antiplatelets/antithrombotics   . Essential hypertension, benign   .  Chronic diastolic heart failure   . Coronary atherosclerosis of native coronary artery   . Shortness of breath   . Other emphysema   . Edema   . Special screening for malignant neoplasm of prostate   . Conjunctivitis unspecified   . Hypopotassemia   . Osteoporosis, unspecified   . Secondary hyperparathyroidism (of renal origin)   . Gout, unspecified   . Pneumonia, organism unspecified   . 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 Hemiplegia affecting unspecified side, late effect of cerebrovascular disease   . Headache   . Other and unspecified hyperlipidemia   . Nephropathy, diabetic     Current Outpatient Prescriptions  Medication Sig Dispense Refill  . albuterol (VENTOLIN HFA) 108 (90 BASE) MCG/ACT inhaler Inhale 2 puffs into the lungs every 6 (six) hours as needed.        . allopurinol (ZYLOPRIM) 300 MG tablet TAKE 1 TABLET BY MOUTH DAILY  30 tablet  11  . aspirin 81 MG tablet Take 81 mg by mouth daily.        . Azelastine HCl (ASTEPRO) 0.15 % SOLN 2 sprays by Nasal route 2 (two) times daily as needed.        . bisoprolol (ZEBETA) 5 MG tablet Take 1 tablet (5 mg total) by mouth daily.  90 tablet  1  . budesonide-formoterol (SYMBICORT) 160-4.5 MCG/ACT inhaler Inhale 2 puffs into the lungs 2 (two) times daily.        . calcitRIOL (ROCALTROL) 0.25 MCG capsule TAKE 1 CAPSULE BY MOUTH DAILY  90 capsule  3  . Calcium-Magnesium-Vitamin D 185-50-100 MG-MG-UNIT CAPS Take 1 capsule by mouth daily.        . CRESTOR 10 MG tablet TAKE 1 TABLET BY MOUTH ONCE DAILY  30 tablet  11  . cyclobenzaprine (  FLEXERIL) 5 MG tablet Take 5 mg by mouth at bedtime as needed.        . ferrous sulfate 325 (65 FE) MG tablet Take 325 mg by mouth 3 (three) times daily.        . finasteride (PROSCAR) 5 MG tablet Take 1 tablet (5 mg total) by mouth daily.  30 tablet  11  . folic acid-pyridoxine-cyancobalamin (FOLBIC) 2.5-25-2 MG TABS Take 1 tablet by  mouth daily.        . furosemide (LASIX) 40 MG tablet Take 2 tablets by mouth every morning and one tablet every other evening  75 tablet  0  . glucose blood test strip Four times a day, variable glucoses dx 250.03      . HYDROcodone-acetaminophen (VICODIN) 5-500 MG per tablet Take 1-2 tablets by mouth every 4 (four) hours as needed.  100 tablet  5  . insulin aspart (NOVOLOG) 100 UNIT/ML injection Inject into the skin 3 (three) times daily before meals. 3x a day (just before each meal) 10-02-09 units      . insulin NPH (HUMULIN N) 100 UNIT/ML injection Inject 10 Units into the skin at bedtime.       . INSULIN SYRINGE 1CC/29G (B-D INSULIN SYRINGE) 29G X 1/2" 1 ML MISC 4 (four) times daily. Dx 250.03      . isosorbide mononitrate (IMDUR) 60 MG 24 hr tablet Take 1 tablet (60 mg total) by mouth daily.  30 tablet  11  . Multiple Vitamins-Minerals (MULTIVITAMIN PO) Take 1 tablet by mouth daily.        . nitroGLYCERIN (NITROLINGUAL) 0.4 MG/SPRAY spray Place 1 spray under the tongue as directed.        . omeprazole (PRILOSEC) 20 MG capsule Take 20 mg by mouth daily.        . polyethylene glycol powder (MIRALAX) powder Take 17 g by mouth as needed.        . Protein (UNJURY UNFLAVORED PO) Take 8 oz by mouth daily.        . TAZTIA XT 180 MG 24 hr capsule TAKE ONE CAPSULE BY MOUTH DAILY  90 capsule  3  . tiotropium (SPIRIVA) 18 MCG inhalation capsule Place 18 mcg into inhaler and inhale daily.       . traZODone (DESYREL) 100 MG tablet TAKE 1 TABLET BY MOUTH EVERY NIGHT AT BEDTIME  90 tablet  2  . triamcinolone (KENALOG) 0.025 % cream APPLY THREE TIMES DAILY ON AFFECTED AREA AS NEEDED  80 g  0     Allergies  Allergen Reactions  . Enalapril Maleate     REACTION: cough  . Shellfish-Derived Products Swelling    History   Social History  . Marital Status: Married    Spouse Name: N/A    Number of Children: Y  . Years of Education: N/A   Occupational History  .  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     Social  History Main Topics  . Smoking status: Former Smoker -- 2.0 packs/day for 41 years    Types: Cigarettes    Quit date: 12/24/1997  . Smokeless tobacco: Former User  . Alcohol Use: No  . Drug Use: No  . Sexually Active: Not on file   Other Topics Concern  . Not on file   Social History Narrative  . No narrative on file    Family History  Problem Relation Age of Onset  . Hypertension      family history of htn  .   Heart disease Maternal Aunt   . Lung cancer Mother   . Cancer Mother     Lung Cancer  . Heart disease Father     CHF    PHYSICAL EXAM: Filed Vitals:   12/14/11 0927  BP: 142/52  Pulse: 69   General:  Fatigued appearing. No respiratory difficulty. Sitting in chair HEENT: normal Neck: supple. JVP hard to see probably 8-9 Carotids 2+ bilat; no bruits. No lymphadenopathy or thryomegaly appreciated. Cor: PMI nondisplaced. Regular rate & rhythm. No rubs, gallops or murmurs. Lungs: clear with minimal bibasilar crackles Abdomen: obese soft, nontender, nondistended. No hepatosplenomegaly. No bruits or masses. Good bowel sounds. Extremities: no cyanosis, clubbing, rash, 3+ edema R 1+ L Neuro: alert & oriented x 3, cranial nerves grossly intact. moves all 4 extremities w/o difficulty. Flat affect    ASSESSMENT & PLAN: 

## 2011-12-14 NOTE — Patient Instructions (Signed)
Increase Lasix (Furosemide) 80 mg every AM and 40 mg every PM  Start Celexa 10 mg daily  You have been referred to [facility] for Physical and Occupational Therapy  Your physician recommends that you schedule a follow-up appointment in: 1 week

## 2011-12-14 NOTE — Assessment & Plan Note (Signed)
He has evidence of significant diastolic dysfunction and cardiorenal syndrome. I suspect his HF will be very difficult to manage. Situation seems to be complicated by lack of his involvement/responsibility for his own care. We had a long talk about the needs for daily weights, sodium restriction and sliding scale diuretic regimen. We will increase lasix to 80am/40pm on daily basis and watch renal function closely. We also discussed possibility of doing RHC to clearly define his volume status and hemodynamics. We will defer that for now. Suspect he has signifcant depression and deconditioning as well. Will add Celexa 10 and have PT/OT come see him.

## 2011-12-20 ENCOUNTER — Encounter (HOSPITAL_COMMUNITY): Payer: Self-pay | Admitting: *Deleted

## 2011-12-20 ENCOUNTER — Ambulatory Visit (HOSPITAL_COMMUNITY)
Admission: RE | Admit: 2011-12-20 | Discharge: 2011-12-20 | Disposition: A | Discharge status: Home / Self Care | Payer: Medicare Other | Source: Ambulatory Visit | Attending: Internal Medicine | Admitting: Internal Medicine

## 2011-12-20 VITALS — BP 126/48 | HR 61 | Wt 211.8 lb

## 2011-12-20 DIAGNOSIS — F3289 Other specified depressive episodes: Secondary | ICD-10-CM | POA: Insufficient documentation

## 2011-12-20 DIAGNOSIS — I509 Heart failure, unspecified: Secondary | ICD-10-CM

## 2011-12-20 DIAGNOSIS — F329 Major depressive disorder, single episode, unspecified: Secondary | ICD-10-CM

## 2011-12-20 DIAGNOSIS — I5032 Chronic diastolic (congestive) heart failure: Secondary | ICD-10-CM

## 2011-12-20 LAB — CBC
MCH: 26.9 pg (ref 26.0–34.0)
MCV: 84.2 fL (ref 78.0–100.0)
Platelets: 280 10*3/uL (ref 150–400)
RBC: 4.5 MIL/uL (ref 4.22–5.81)

## 2011-12-20 LAB — BASIC METABOLIC PANEL
CO2: 33 mEq/L — ABNORMAL HIGH (ref 19–32)
Calcium: 9.7 mg/dL (ref 8.4–10.5)
Creatinine, Ser: 1.29 mg/dL (ref 0.50–1.35)
Glucose, Bld: 121 mg/dL — ABNORMAL HIGH (ref 70–99)
Sodium: 137 mEq/L (ref 135–145)

## 2011-12-20 MED ORDER — CITALOPRAM HYDROBROMIDE 10 MG PO TABS: 20.0000 mg | ORAL_TABLET | Freq: Every day | ORAL | Status: DC

## 2011-12-20 NOTE — Assessment & Plan Note (Signed)
Mood much improved. Increase Celexa to 20 daily.

## 2011-12-20 NOTE — Patient Instructions (Addendum)
Increase Celexa to 20 mg daily  Heart Cath Fri 12/28  Your physician recommends that you schedule a follow-up appointment in: 4 weeks

## 2011-12-20 NOTE — Assessment & Plan Note (Addendum)
Volume status looks some better but still markedly dyspneic. Given fluctuating renal function, I think best plan is to proceed with RHC to clearly define where we are at with his fluid status. i discussed this with him and he agrees. Will plan to do this tomorrow. We discussed risks and indications in detail and he agrees to proceed.   Total MD time = 30 mins.

## 2011-12-20 NOTE — Progress Notes (Signed)
HPI:  [name] is a 70 yo WM with h/o CAD, obesity s/p Lap Band June 2012, diastolic CHF (EF45-50%), HTN,CRI (basline about 1.5-1.8 - follows with Dr. Lowell Guitar), hyperlipidemia, PAD, DM referred to HF clinic by Dr. Clifton James for help with CHF management.  He has CAD with multiple prior PCI procedures. Stress myoview in February 2012 with no ischemia. This was done as part of the pre-operative workup before a planned lap band surgery. Recent echo with EF 45-50% + grade 2 diastolic dysfunction. RV normal  He has been followed closely by Dr. Clifton James. Referred to HF clinic recently for help with volume management.  At last visit we increased his lasix to 80/40. Also added Celexa for depression. Returns for f/u.   Weight down 1 pound from last visit. Says mobility still limited due to SOB. Says he can only walk 10-15 yards/ Has PT/OT evaluation pending. Feels like weight is more stable not as labile. + edema. ab distension. Celexa helping mood a lot. Says he is not eating much at all.    ROS: All other systems normal except as listed in the HPI and Problem List.     Past Medical History  Diagnosis Date  . Allergic rhinitis   . COPD (chronic obstructive pulmonary disease)     mild to moderate by pfts in 2006  . HLD (hyperlipidemia)   . Impotence   . PVD (peripheral vascular disease)   . Type II or unspecified type diabetes mellitus with renal manifestations, not stated as uncontrolled   . Renal insufficiency   . Osteoarthritis   . Cough     due to Zestril  . Encounter for long-term (current) use of aspirin   . Encounter for long-term (current) use of antiplatelets/antithrombotics   . Essential hypertension, benign   . Chronic diastolic heart failure   . Coronary atherosclerosis of native coronary artery   . Shortness of breath   . Other emphysema   . Edema   . Special screening for malignant neoplasm of prostate   . Conjunctivitis unspecified   . Hypopotassemia   . Osteoporosis,  unspecified   . Secondary hyperparathyroidism (of renal origin)   . Gout, unspecified   . Pneumonia, organism unspecified   . 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 Hemiplegia affecting unspecified side, late effect of cerebrovascular disease   . Headache   . Other and unspecified hyperlipidemia   . Nephropathy, diabetic     Current Outpatient Prescriptions  Medication Sig Dispense Refill  . albuterol (VENTOLIN HFA) 108 (90 BASE) MCG/ACT inhaler Inhale 2 puffs into the lungs every 6 (six) hours as needed.        Marland Kitchen allopurinol (ZYLOPRIM) 300 MG tablet TAKE 1 TABLET BY MOUTH DAILY  30 tablet  11  . aspirin 81 MG tablet Take 81 mg by mouth daily.        . Azelastine HCl (ASTEPRO) 0.15 % SOLN 2 sprays by Nasal route 2 (two) times daily as needed.        . bisoprolol (ZEBETA) 5 MG tablet Take 1 tablet (5 mg total) by mouth daily.  90 tablet  1  . budesonide-formoterol (SYMBICORT) 160-4.5 MCG/ACT inhaler Inhale 2 puffs into the lungs 2 (two) times daily.        . calcitRIOL (ROCALTROL) 0.25 MCG capsule TAKE 1 CAPSULE BY MOUTH DAILY  90 capsule  3  . Calcium-Magnesium-Vitamin D 185-50-100 MG-MG-UNIT CAPS Take 1 capsule by mouth  daily.        . citalopram (CELEXA) 10 MG tablet Take 1 tablet (10 mg total) by mouth daily.  30 tablet  3  . CRESTOR 10 MG tablet TAKE 1 TABLET BY MOUTH ONCE DAILY  30 tablet  11  . ferrous sulfate 325 (65 FE) MG tablet Take 325 mg by mouth daily with breakfast.       . finasteride (PROSCAR) 5 MG tablet Take 1 tablet (5 mg total) by mouth daily.  30 tablet  11  . folic acid-pyridoxine-cyancobalamin (FOLBIC) 2.5-25-2 MG TABS Take 1 tablet by mouth daily.        . furosemide (LASIX) 40 MG tablet 40 mg. Take 80 mg by mouth every morning and 40 mg every evening       . glucose blood test strip Four times a day, variable glucoses dx 250.03      . HYDROcodone-acetaminophen (VICODIN) 5-500 MG per tablet Take 1-2  tablets by mouth every 4 (four) hours as needed.  100 tablet  5  . insulin aspart (NOVOLOG) 100 UNIT/ML injection Inject into the skin 3 (three) times daily before meals. 3x a day (just before each meal) 10-02-09 units      . insulin NPH (HUMULIN N) 100 UNIT/ML injection Inject 10 Units into the skin at bedtime.       . INSULIN SYRINGE 1CC/29G (B-D INSULIN SYRINGE) 29G X 1/2" 1 ML MISC 4 (four) times daily. Dx 250.03      . isosorbide mononitrate (IMDUR) 60 MG 24 hr tablet Take 1 tablet (60 mg total) by mouth daily.  30 tablet  11  . Multiple Vitamins-Minerals (MULTIVITAMIN PO) Take 1 tablet by mouth daily.        . nitroGLYCERIN (NITROLINGUAL) 0.4 MG/SPRAY spray Place 1 spray under the tongue as directed.        Marland Kitchen omeprazole (PRILOSEC) 20 MG capsule Take 20 mg by mouth daily.        . polyethylene glycol powder (MIRALAX) powder Take 17 g by mouth as needed.        . Protein (UNJURY UNFLAVORED PO) Take 8 oz by mouth daily.        Marland Kitchen TAZTIA XT 180 MG 24 hr capsule TAKE ONE CAPSULE BY MOUTH DAILY  90 capsule  3  . tiotropium (SPIRIVA) 18 MCG inhalation capsule Place 18 mcg into inhaler and inhale daily.       . traZODone (DESYREL) 100 MG tablet TAKE 1 TABLET BY MOUTH EVERY NIGHT AT BEDTIME  90 tablet  2  . triamcinolone (KENALOG) 0.025 % cream APPLY THREE TIMES DAILY ON AFFECTED AREA AS NEEDED  80 g  0     Allergies  Allergen Reactions  . Enalapril Maleate     REACTION: cough  . Shellfish-Derived Products Swelling    History   Social History  . Marital Status: Married    Spouse Name: N/A    Number of Children: Y  . Years of Education: N/A   Occupational History  .  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     Social History Main Topics  . Smoking status: Former Smoker -- 2.0 packs/day for 41 years    Types: Cigarettes    Quit date: 12/24/1997  . Smokeless tobacco: Former Neurosurgeon  . Alcohol Use: No  . Drug Use: No  . Sexually Active: Not on file   Other Topics Concern  . Not on file   Social  History Narrative  . No  narrative on file    Family History  Problem Relation Age of Onset  . Hypertension      family history of htn  . Heart disease Maternal Aunt   . Lung cancer Mother   . Cancer Mother     Lung Cancer  . Heart disease Father     CHF    PHYSICAL EXAM: Filed Vitals:   12/20/11 1443  BP: 126/48  Pulse: 61   General:  Looks brighter No respiratory difficulty. Sitting in chair HEENT: normal Neck: supple. JVP hard to see probably 8-9 Carotids 2+ bilat; no bruits. No lymphadenopathy or thryomegaly appreciated. Cor: PMI nondisplaced. Regular rate & rhythm. No rubs, gallops or murmurs. Lungs: clear with minimal bibasilar crackles Abdomen: obese soft, nontender, nondistended. No hepatosplenomegaly. No bruits or masses. Good bowel sounds. Extremities: no cyanosis, clubbing, rash, 2+ edema R TR L Neuro: alert & oriented x 3, cranial nerves grossly intact. moves all 4 extremities w/o difficulty. Normal affect    ASSESSMENT & PLAN:

## 2011-12-21 ENCOUNTER — Telehealth (HOSPITAL_COMMUNITY): Payer: Self-pay | Admitting: *Deleted

## 2011-12-21 ENCOUNTER — Encounter (HOSPITAL_BASED_OUTPATIENT_CLINIC_OR_DEPARTMENT_OTHER)
Admission: RE | Disposition: A | Discharge status: Home / Self Care | Payer: Self-pay | Source: Ambulatory Visit | Attending: Internal Medicine

## 2011-12-21 ENCOUNTER — Inpatient Hospital Stay (HOSPITAL_BASED_OUTPATIENT_CLINIC_OR_DEPARTMENT_OTHER)
Admission: RE | Admit: 2011-12-21 | Discharge: 2011-12-21 | Disposition: A | Discharge status: Home / Self Care | Payer: Medicare Other | Source: Ambulatory Visit | Attending: Internal Medicine | Admitting: Internal Medicine

## 2011-12-21 DIAGNOSIS — E119 Type 2 diabetes mellitus without complications: Secondary | ICD-10-CM | POA: Insufficient documentation

## 2011-12-21 DIAGNOSIS — Z9884 Bariatric surgery status: Secondary | ICD-10-CM | POA: Insufficient documentation

## 2011-12-21 DIAGNOSIS — I5030 Unspecified diastolic (congestive) heart failure: Secondary | ICD-10-CM | POA: Insufficient documentation

## 2011-12-21 DIAGNOSIS — I2789 Other specified pulmonary heart diseases: Secondary | ICD-10-CM | POA: Insufficient documentation

## 2011-12-21 DIAGNOSIS — I509 Heart failure, unspecified: Secondary | ICD-10-CM

## 2011-12-21 DIAGNOSIS — I2510 Atherosclerotic heart disease of native coronary artery without angina pectoris: Secondary | ICD-10-CM | POA: Insufficient documentation

## 2011-12-21 DIAGNOSIS — E785 Hyperlipidemia, unspecified: Secondary | ICD-10-CM | POA: Insufficient documentation

## 2011-12-21 DIAGNOSIS — I129 Hypertensive chronic kidney disease with stage 1 through stage 4 chronic kidney disease, or unspecified chronic kidney disease: Secondary | ICD-10-CM | POA: Insufficient documentation

## 2011-12-21 DIAGNOSIS — E669 Obesity, unspecified: Secondary | ICD-10-CM | POA: Insufficient documentation

## 2011-12-21 DIAGNOSIS — N189 Chronic kidney disease, unspecified: Secondary | ICD-10-CM | POA: Insufficient documentation

## 2011-12-21 DIAGNOSIS — I739 Peripheral vascular disease, unspecified: Secondary | ICD-10-CM | POA: Insufficient documentation

## 2011-12-21 LAB — POCT I-STAT 3, VENOUS BLOOD GAS (G3P V)
Acid-Base Excess: 6 mmol/L — ABNORMAL HIGH (ref 0.0–2.0)
Acid-Base Excess: 7 mmol/L — ABNORMAL HIGH (ref 0.0–2.0)
Bicarbonate: 32.5 mEq/L — ABNORMAL HIGH (ref 20.0–24.0)
O2 Saturation: 64 %
O2 Saturation: 64 %
TCO2: 34 mmol/L (ref 0–100)
pCO2, Ven: 54 mmHg — ABNORMAL HIGH (ref 45.0–50.0)

## 2011-12-21 LAB — POCT I-STAT 3, ART BLOOD GAS (G3+)
Acid-Base Excess: 8 mmol/L — ABNORMAL HIGH (ref 0.0–2.0)
Bicarbonate: 33.3 mEq/L — ABNORMAL HIGH (ref 20.0–24.0)
Bicarbonate: 34.3 mEq/L — ABNORMAL HIGH (ref 20.0–24.0)
O2 Saturation: 67 %
O2 Saturation: 95 %
TCO2: 35 mmol/L (ref 0–100)
TCO2: 36 mmol/L (ref 0–100)
pCO2 arterial: 53.7 mmHg — ABNORMAL HIGH (ref 35.0–45.0)
pO2, Arterial: 36 mmHg — CL (ref 80.0–100.0)

## 2011-12-21 SURGERY — JV RIGHT HEART CATHETERIZATION
Anesthesia: Moderate Sedation

## 2011-12-21 MED ORDER — SODIUM CHLORIDE 0.9 % IV SOLN: 250.0000 mL | INTRAVENOUS | Status: DC | PRN

## 2011-12-21 MED ORDER — SODIUM CHLORIDE 0.9 % IJ SOLN: 3.0000 mL | Freq: Two times a day (BID) | INTRAMUSCULAR | Status: DC

## 2011-12-21 MED ORDER — SODIUM CHLORIDE 0.9 % IJ SOLN: 3.0000 mL | INTRAMUSCULAR | Status: DC | PRN

## 2011-12-21 MED ORDER — ONDANSETRON HCL 4 MG/2ML IJ SOLN: 4.0000 mg | Freq: Four times a day (QID) | INTRAMUSCULAR | Status: DC | PRN

## 2011-12-21 MED ORDER — ACETAMINOPHEN 325 MG PO TABS: 650.0000 mg | ORAL_TABLET | ORAL | Status: DC | PRN

## 2011-12-21 NOTE — Telephone Encounter (Signed)
Herbert Seta, please call Tamela Oddi at [facility], telephone [phone].  She needs to talk to you concerning the order you sent on Lionel December. frl

## 2011-12-21 NOTE — OR Nursing (Signed)
Tegaderm dressing applied, site intact, level 0, no bleeding, bedrest begins at 1340

## 2011-12-21 NOTE — Interval H&P Note (Signed)
History and Physical Interval Note:  12/21/2011 1:00 PM  [name]  has presented today for surgery, with the diagnosis of heart failure  The various methods of treatment have been discussed with the patient and family. After consideration of risks, benefits and other options for treatment, the patient has consented to  Procedure(s): JV RIGHT HEART CATHETERIZATION as a surgical intervention .  The patients' history has been reviewed, patient examined, no change in status, stable for surgery.  I have reviewed the patients' chart and labs.  Questions were answered to the patient's satisfaction.     Kariel Skillman

## 2011-12-21 NOTE — OR Nursing (Signed)
Discharge instructions reveiwed and signed, pt stated understanding, ambulated in hall without difficulty, site intact, level 0, no bleeding, transported to friend's car via wheelchair

## 2011-12-21 NOTE — OR Nursing (Signed)
Meal served 

## 2011-12-21 NOTE — Op Note (Signed)
Cardiac Cath Procedure Note:  Indication:  HF  Procedures performed:  1) Right heart catheterization  Description of procedure:   The risks and indication of the procedure were explained. Consent was signed and placed on the chart. An appropriate timeout was taken prior to the procedure. The right groin was prepped and draped in the routine sterile fashion and anesthetized with 1% local lidocaine.   A 7 FR venous sheath was placed in the right femoral vein using a modified Seldinger technique. A standard Swan-Ganz catheter was used for the procedure.   Complications: None apparent.  Findings:  RA =  8 RV =  45/7/13 PA =   41/14 (27) PCW =  13 Fick cardiac output/index =  4.8/2.5 PVR = 2.9 FA sat = 97% (2L) PA sat = 64%, 64%   Assessment:  HF is well compensated. Minimal pulmonary HTN. Patient with desaturations into 70s with moving from stretcher to cath table. Suspect majority of symptoms related to pulmonary disease. Will repeat PFTs with DLCO.    Daniel Bensimhon 1:23 PM

## 2011-12-21 NOTE — OR Nursing (Signed)
Dr Bensimhon at bedside to discuss results and treatment plan with pt and family 

## 2011-12-21 NOTE — Telephone Encounter (Signed)
Spoke w/Betsy, Dr Gala Romney is aware that therapy will be delayed

## 2011-12-21 NOTE — H&P (View-Only) (Signed)
HPI:  [name] is a 70 yo WM with h/o CAD, obesity s/p Lap Band June 2012, diastolic CHF (EF45-50%), HTN,CRI, hyperlipidemia, PAD, DM referred to HF clinic by Dr. Clifton James for help with CHF management.  He has CAD with multiple prior PCI procedures. Stress myoview in February 2012 with no ischemia. This was done as part of the pre-operative workup before a planned lap band surgery. Recent echo with EF 45-50% + grade 2 diastolic dysfunction. RV normal  He has been followed closely by Dr. Clifton James with careful attention to his weight and volume status.  His weight had been stable but lower ext edema has increased. Lasix was increased and also been treated with Zaroxolyn and aldactone at doses that he had tolerated in the past. Unfortunately, his renal function has worsened with the higher doses of diuretics. F/U BMET 11/26/11 with BMET of 57 and creatinine of 2.0. His Zaroxolyn and Aldactone have subsequently been held. Lasix currently at 80 mg daily in am and 40 mg every other afternoon. He says several years ago was taking lasix 240 bid + spiro and aldactone but diuretics cut back after Lap Band.   Continues to struggle. Lowest weight recently was 205 now 212.  Say he weights himself just about every day. Doesn't write it down. Says he has followed with Dr. Lowell Guitar and Cr tends to run around 1.8. However in looking back over the summer Cr 1.0-1.3. More recently up in the 1.8-2.0. Over the summer fell and broke several bones and did rehab at [facility] for 5 weeks.   Says mobility very limited currently due to SOB. Says he can only walk 10-15 feet. Sleeps on couch straight up. Lives at home with wife and grandson. No CP. Wife cooks. Eats a lot of red meat. Says he doesn't use salt but doesn't know what amount of sodium to look out for. + edema. ab distension.   Somewhat depressed. Wife is about to have gastric bypass. 2 daughters with MS.   Review of Systems:     Cardiac Review of Systems: {Y] = yes [ ]   = no  Chest Pain [    ]  Resting SOB [ Y  ] Exertional SOB  [Y  ]  Pollyann Kennedy Gilian.Kraft  ]   Pedal Edema [ Y  ]    Palpitations [  ] Syncope  [  ]   Presyncope [   ]  General Review of Systems: [Y] = yes [  ]=no Constitional: recent weight change [  ]; anorexia [  ]; fatigue [ Y ]; nausea [  ]; night sweats [  ]; fever [  ]; or chills [  ];                                                                             Eye : blurred vision [  ]; diplopia [   ]; vision changes [  ];  Amaurosis fugax[  ]; Resp: cough [  ];  wheezing[  ];  hemoptysis[  ]; shortness of breath[Y  ]; paroxysmal nocturnal dyspnea[  ]; dyspnea on exertion[Y  ]; or orthopnea[ Y ];  GI:  gallstones[  ], vomiting[  ];  dysphagia[  ];  melena[  ];  hematochezia [  ]; heartburn[  ];   Hx of  Colonoscopy[  ]; GU: kidney stones [  ]; hematuria[  ];   dysuria [  ];  nocturia[  ];  history of     obstruction [  ];                 Skin: rash, swelling[  ];, hair loss[  ];  peripheral edema[  ];  or itching[  ]; Musculosketetal: myalgias[  ];  joint swelling[  ];  joint erythema[  ];  joint pain[Y  ];  back pain[Y  ];  Heme/Lymph: bruising[  ];  bleeding[  ];  anemia[  ];  Neuro: TIA[  ];  headaches[  ];  stroke[  ];  vertigo[  ];  seizures[  ];   paresthesias[  ];  difficulty walking[ Y ];  Psych:depression[ Y ]; anxiety[  ];  Endocrine: diabetes[Y  ];  thyroid dysfunction[  ]   Past Medical History  Diagnosis Date  . Allergic rhinitis   . COPD (chronic obstructive pulmonary disease)     mild to moderate by pfts in 2006  . HLD (hyperlipidemia)   . Impotence   . PVD (peripheral vascular disease)   . Type II or unspecified type diabetes mellitus with renal manifestations, not stated as uncontrolled   . Renal insufficiency   . Osteoarthritis   . Cough     due to Zestril  . Encounter for long-term (current) use of aspirin   . Encounter for long-term (current) use of antiplatelets/antithrombotics   . Essential hypertension, benign   .  Chronic diastolic heart failure   . Coronary atherosclerosis of native coronary artery   . Shortness of breath   . Other emphysema   . Edema   . Special screening for malignant neoplasm of prostate   . Conjunctivitis unspecified   . Hypopotassemia   . Osteoporosis, unspecified   . Secondary hyperparathyroidism (of renal origin)   . Gout, unspecified   . Pneumonia, organism unspecified   . 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 Hemiplegia affecting unspecified side, late effect of cerebrovascular disease   . Headache   . Other and unspecified hyperlipidemia   . Nephropathy, diabetic     Current Outpatient Prescriptions  Medication Sig Dispense Refill  . albuterol (VENTOLIN HFA) 108 (90 BASE) MCG/ACT inhaler Inhale 2 puffs into the lungs every 6 (six) hours as needed.        Marland Kitchen allopurinol (ZYLOPRIM) 300 MG tablet TAKE 1 TABLET BY MOUTH DAILY  30 tablet  11  . aspirin 81 MG tablet Take 81 mg by mouth daily.        . Azelastine HCl (ASTEPRO) 0.15 % SOLN 2 sprays by Nasal route 2 (two) times daily as needed.        . bisoprolol (ZEBETA) 5 MG tablet Take 1 tablet (5 mg total) by mouth daily.  90 tablet  1  . budesonide-formoterol (SYMBICORT) 160-4.5 MCG/ACT inhaler Inhale 2 puffs into the lungs 2 (two) times daily.        . calcitRIOL (ROCALTROL) 0.25 MCG capsule TAKE 1 CAPSULE BY MOUTH DAILY  90 capsule  3  . Calcium-Magnesium-Vitamin D 185-50-100 MG-MG-UNIT CAPS Take 1 capsule by mouth daily.        . CRESTOR 10 MG tablet TAKE 1 TABLET BY MOUTH ONCE DAILY  30 tablet  11  . cyclobenzaprine (  FLEXERIL) 5 MG tablet Take 5 mg by mouth at bedtime as needed.        . ferrous sulfate 325 (65 FE) MG tablet Take 325 mg by mouth 3 (three) times daily.        . finasteride (PROSCAR) 5 MG tablet Take 1 tablet (5 mg total) by mouth daily.  30 tablet  11  . folic acid-pyridoxine-cyancobalamin (FOLBIC) 2.5-25-2 MG TABS Take 1 tablet by  mouth daily.        . furosemide (LASIX) 40 MG tablet Take 2 tablets by mouth every morning and one tablet every other evening  75 tablet  0  . glucose blood test strip Four times a day, variable glucoses dx 250.03      . HYDROcodone-acetaminophen (VICODIN) 5-500 MG per tablet Take 1-2 tablets by mouth every 4 (four) hours as needed.  100 tablet  5  . insulin aspart (NOVOLOG) 100 UNIT/ML injection Inject into the skin 3 (three) times daily before meals. 3x a day (just before each meal) 10-02-09 units      . insulin NPH (HUMULIN N) 100 UNIT/ML injection Inject 10 Units into the skin at bedtime.       . INSULIN SYRINGE 1CC/29G (B-D INSULIN SYRINGE) 29G X 1/2" 1 ML MISC 4 (four) times daily. Dx 250.03      . isosorbide mononitrate (IMDUR) 60 MG 24 hr tablet Take 1 tablet (60 mg total) by mouth daily.  30 tablet  11  . Multiple Vitamins-Minerals (MULTIVITAMIN PO) Take 1 tablet by mouth daily.        . nitroGLYCERIN (NITROLINGUAL) 0.4 MG/SPRAY spray Place 1 spray under the tongue as directed.        Marland Kitchen omeprazole (PRILOSEC) 20 MG capsule Take 20 mg by mouth daily.        . polyethylene glycol powder (MIRALAX) powder Take 17 g by mouth as needed.        . Protein (UNJURY UNFLAVORED PO) Take 8 oz by mouth daily.        Marland Kitchen TAZTIA XT 180 MG 24 hr capsule TAKE ONE CAPSULE BY MOUTH DAILY  90 capsule  3  . tiotropium (SPIRIVA) 18 MCG inhalation capsule Place 18 mcg into inhaler and inhale daily.       . traZODone (DESYREL) 100 MG tablet TAKE 1 TABLET BY MOUTH EVERY NIGHT AT BEDTIME  90 tablet  2  . triamcinolone (KENALOG) 0.025 % cream APPLY THREE TIMES DAILY ON AFFECTED AREA AS NEEDED  80 g  0     Allergies  Allergen Reactions  . Enalapril Maleate     REACTION: cough  . Shellfish-Derived Products Swelling    History   Social History  . Marital Status: Married    Spouse Name: N/A    Number of Children: Y  . Years of Education: N/A   Occupational History  .  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     Social  History Main Topics  . Smoking status: Former Smoker -- 2.0 packs/day for 41 years    Types: Cigarettes    Quit date: 12/24/1997  . Smokeless tobacco: Former Neurosurgeon  . Alcohol Use: No  . Drug Use: No  . Sexually Active: Not on file   Other Topics Concern  . Not on file   Social History Narrative  . No narrative on file    Family History  Problem Relation Age of Onset  . Hypertension      family history of htn  .  Heart disease Maternal Aunt   . Lung cancer Mother   . Cancer Mother     Lung Cancer  . Heart disease Father     CHF    PHYSICAL EXAM: Filed Vitals:   12/14/11 0927  BP: 142/52  Pulse: 69   General:  Fatigued appearing. No respiratory difficulty. Sitting in chair HEENT: normal Neck: supple. JVP hard to see probably 8-9 Carotids 2+ bilat; no bruits. No lymphadenopathy or thryomegaly appreciated. Cor: PMI nondisplaced. Regular rate & rhythm. No rubs, gallops or murmurs. Lungs: clear with minimal bibasilar crackles Abdomen: obese soft, nontender, nondistended. No hepatosplenomegaly. No bruits or masses. Good bowel sounds. Extremities: no cyanosis, clubbing, rash, 3+ edema R 1+ L Neuro: alert & oriented x 3, cranial nerves grossly intact. moves all 4 extremities w/o difficulty. Flat affect    ASSESSMENT & PLAN:

## 2011-12-25 ENCOUNTER — Other Ambulatory Visit: Payer: Self-pay | Admitting: Endocrinology

## 2011-12-25 DIAGNOSIS — C44621 Squamous cell carcinoma of skin of unspecified upper limb, including shoulder: Secondary | ICD-10-CM

## 2011-12-25 HISTORY — PX: SQUAMOUS CELL CARCINOMA EXCISION: SHX2433

## 2011-12-25 HISTORY — DX: Squamous cell carcinoma of skin of unspecified upper limb, including shoulder: C44.621

## 2011-12-31 DIAGNOSIS — I509 Heart failure, unspecified: Secondary | ICD-10-CM | POA: Diagnosis not present

## 2011-12-31 DIAGNOSIS — F329 Major depressive disorder, single episode, unspecified: Secondary | ICD-10-CM | POA: Diagnosis not present

## 2011-12-31 DIAGNOSIS — I2510 Atherosclerotic heart disease of native coronary artery without angina pectoris: Secondary | ICD-10-CM | POA: Diagnosis not present

## 2011-12-31 DIAGNOSIS — R269 Unspecified abnormalities of gait and mobility: Secondary | ICD-10-CM | POA: Diagnosis not present

## 2011-12-31 DIAGNOSIS — Z5189 Encounter for other specified aftercare: Secondary | ICD-10-CM | POA: Diagnosis not present

## 2011-12-31 DIAGNOSIS — J449 Chronic obstructive pulmonary disease, unspecified: Secondary | ICD-10-CM | POA: Diagnosis not present

## 2012-01-01 ENCOUNTER — Telehealth (HOSPITAL_COMMUNITY): Payer: Self-pay | Admitting: *Deleted

## 2012-01-01 ENCOUNTER — Encounter (HOSPITAL_COMMUNITY): Payer: Medicare Other

## 2012-01-01 DIAGNOSIS — J449 Chronic obstructive pulmonary disease, unspecified: Secondary | ICD-10-CM

## 2012-01-01 NOTE — Telephone Encounter (Signed)
Mr Genrich called today.  He is calling regarding his recent cath he had on 12-28 with Dr Gala Romney.  Upon his discharge, Dr Gala Romney told Mr Rickett that he can go ahead and have a PFT.  I looked in his orders and did not see an order for a PFT at this time.  Please let me know if we need to schedule Mr Stork for a PFT.  Thanks.

## 2012-01-01 NOTE — Telephone Encounter (Signed)
Yes per Dr Gala Romney pt does need pfts, I placed order if you can please call and schedule thanks

## 2012-01-07 ENCOUNTER — Other Ambulatory Visit: Payer: Self-pay | Admitting: Endocrinology

## 2012-01-07 ENCOUNTER — Ambulatory Visit (HOSPITAL_COMMUNITY)
Admission: RE | Admit: 2012-01-07 | Discharge: 2012-01-07 | Disposition: A | Discharge status: Home / Self Care | Payer: Medicare Other | Source: Ambulatory Visit | Attending: Internal Medicine | Admitting: Internal Medicine

## 2012-01-07 DIAGNOSIS — R269 Unspecified abnormalities of gait and mobility: Secondary | ICD-10-CM | POA: Diagnosis not present

## 2012-01-07 DIAGNOSIS — J449 Chronic obstructive pulmonary disease, unspecified: Secondary | ICD-10-CM | POA: Diagnosis not present

## 2012-01-07 DIAGNOSIS — Z5189 Encounter for other specified aftercare: Secondary | ICD-10-CM | POA: Diagnosis not present

## 2012-01-07 DIAGNOSIS — J4489 Other specified chronic obstructive pulmonary disease: Secondary | ICD-10-CM | POA: Diagnosis not present

## 2012-01-07 DIAGNOSIS — I2510 Atherosclerotic heart disease of native coronary artery without angina pectoris: Secondary | ICD-10-CM | POA: Diagnosis not present

## 2012-01-07 DIAGNOSIS — I509 Heart failure, unspecified: Secondary | ICD-10-CM | POA: Diagnosis not present

## 2012-01-07 DIAGNOSIS — F329 Major depressive disorder, single episode, unspecified: Secondary | ICD-10-CM | POA: Diagnosis not present

## 2012-01-07 MED ORDER — ALBUTEROL SULFATE (5 MG/ML) 0.5% IN NEBU
2.5000 mg | INHALATION_SOLUTION | Freq: Once | RESPIRATORY_TRACT | Status: AC
  Administered 2012-01-07: 2.5 mg via RESPIRATORY_TRACT

## 2012-01-08 DIAGNOSIS — J449 Chronic obstructive pulmonary disease, unspecified: Secondary | ICD-10-CM | POA: Diagnosis not present

## 2012-01-08 DIAGNOSIS — F329 Major depressive disorder, single episode, unspecified: Secondary | ICD-10-CM | POA: Diagnosis not present

## 2012-01-08 DIAGNOSIS — I509 Heart failure, unspecified: Secondary | ICD-10-CM | POA: Diagnosis not present

## 2012-01-08 DIAGNOSIS — R269 Unspecified abnormalities of gait and mobility: Secondary | ICD-10-CM | POA: Diagnosis not present

## 2012-01-08 DIAGNOSIS — Z5189 Encounter for other specified aftercare: Secondary | ICD-10-CM | POA: Diagnosis not present

## 2012-01-08 DIAGNOSIS — I2510 Atherosclerotic heart disease of native coronary artery without angina pectoris: Secondary | ICD-10-CM | POA: Diagnosis not present

## 2012-01-09 DIAGNOSIS — F329 Major depressive disorder, single episode, unspecified: Secondary | ICD-10-CM | POA: Diagnosis not present

## 2012-01-09 DIAGNOSIS — Z5189 Encounter for other specified aftercare: Secondary | ICD-10-CM | POA: Diagnosis not present

## 2012-01-09 DIAGNOSIS — R269 Unspecified abnormalities of gait and mobility: Secondary | ICD-10-CM | POA: Diagnosis not present

## 2012-01-09 DIAGNOSIS — J449 Chronic obstructive pulmonary disease, unspecified: Secondary | ICD-10-CM | POA: Diagnosis not present

## 2012-01-09 DIAGNOSIS — I509 Heart failure, unspecified: Secondary | ICD-10-CM | POA: Diagnosis not present

## 2012-01-09 DIAGNOSIS — I2510 Atherosclerotic heart disease of native coronary artery without angina pectoris: Secondary | ICD-10-CM | POA: Diagnosis not present

## 2012-01-11 DIAGNOSIS — R269 Unspecified abnormalities of gait and mobility: Secondary | ICD-10-CM | POA: Diagnosis not present

## 2012-01-11 DIAGNOSIS — I2510 Atherosclerotic heart disease of native coronary artery without angina pectoris: Secondary | ICD-10-CM | POA: Diagnosis not present

## 2012-01-11 DIAGNOSIS — I509 Heart failure, unspecified: Secondary | ICD-10-CM | POA: Diagnosis not present

## 2012-01-11 DIAGNOSIS — Z5189 Encounter for other specified aftercare: Secondary | ICD-10-CM | POA: Diagnosis not present

## 2012-01-11 DIAGNOSIS — F329 Major depressive disorder, single episode, unspecified: Secondary | ICD-10-CM | POA: Diagnosis not present

## 2012-01-11 DIAGNOSIS — J449 Chronic obstructive pulmonary disease, unspecified: Secondary | ICD-10-CM | POA: Diagnosis not present

## 2012-01-14 ENCOUNTER — Ambulatory Visit (HOSPITAL_COMMUNITY)
Admission: RE | Admit: 2012-01-14 | Discharge: 2012-01-14 | Disposition: A | Discharge status: Home / Self Care | Payer: Medicare Other | Source: Ambulatory Visit | Attending: Internal Medicine | Admitting: Internal Medicine

## 2012-01-14 VITALS — BP 130/56 | HR 64 | Wt 215.5 lb

## 2012-01-14 DIAGNOSIS — I509 Heart failure, unspecified: Secondary | ICD-10-CM

## 2012-01-14 DIAGNOSIS — I5032 Chronic diastolic (congestive) heart failure: Secondary | ICD-10-CM | POA: Insufficient documentation

## 2012-01-14 DIAGNOSIS — R269 Unspecified abnormalities of gait and mobility: Secondary | ICD-10-CM | POA: Diagnosis not present

## 2012-01-14 DIAGNOSIS — F329 Major depressive disorder, single episode, unspecified: Secondary | ICD-10-CM | POA: Diagnosis not present

## 2012-01-14 DIAGNOSIS — I2510 Atherosclerotic heart disease of native coronary artery without angina pectoris: Secondary | ICD-10-CM | POA: Diagnosis not present

## 2012-01-14 DIAGNOSIS — Z5189 Encounter for other specified aftercare: Secondary | ICD-10-CM | POA: Diagnosis not present

## 2012-01-14 DIAGNOSIS — J449 Chronic obstructive pulmonary disease, unspecified: Secondary | ICD-10-CM | POA: Diagnosis not present

## 2012-01-14 NOTE — Progress Notes (Signed)
Patient ID: [name], male   DOB: 11-16-1941, 71 y.o.   MRN: [id number]  HPI:  [name] is a 71 yo WM with h/o CAD, obesity s/p Lap Band June 2012, diastolic CHF (EF45-50%), HTN,CRI (basline about 1.5-1.8 - follows with Dr. Lowell Guitar), hyperlipidemia, PAD, DM referred to HF clinic by Dr. Clifton James for help with CHF management.  He has CAD with multiple prior PCI procedures. Stress myoview in February 2012 with no ischemia. This was done as part of the pre-operative workup before a planned lap band surgery. Recent echo with EF 45-50% + grade 2 diastolic dysfunction. RV normal  He has been followed closely by Dr. Clifton James. Referred to HF clinic recently for help with CHF.  12/28 RA = 8 RV = 45/7/13 PA = 41/14 (27) PCW = 13 Fick cardiac output/index = 4.8/2.5  PVR = 2.9 FA sat = 97% (2L) PA sat = 64%, 64%   01/07/12 PFTs= Severe air flow limitation. + Restrictive lung disease. FEV1 0.91L (39%) FVC 1.38 (43%) FEV1/FVC 74% FEF 25-75% 0.44 (24%) DLCO 52%   He returns for follow up. Feels ok. Weight at home 207-212 pounds.  Says mobility still limited due to SOB. Says he can only walk 10-15 yards/ Has PT/OT evaluation pending. Feels like weight is not as stable.  + mild edema RLE>LLE. Abdominal distension.  He has not been able to start pulmonary rehab due to transportation. He has not been following low salt diet.    ROS: All other systems normal except as listed in the HPI and Problem List.     Past Medical History  Diagnosis Date  . Allergic rhinitis   . COPD (chronic obstructive pulmonary disease)     mild to moderate by pfts in 2006  . HLD (hyperlipidemia)   . Impotence   . PVD (peripheral vascular disease)   . Type II or unspecified type diabetes mellitus with renal manifestations, not stated as uncontrolled   . Renal insufficiency   . Osteoarthritis   . Cough     due to Zestril  . Encounter for long-term (current) use of aspirin   . Encounter for long-term (current) use of  antiplatelets/antithrombotics   . Essential hypertension, benign   . Chronic diastolic heart failure   . Coronary atherosclerosis of native coronary artery   . Shortness of breath   . Other emphysema   . Edema   . Special screening for malignant neoplasm of prostate   . Conjunctivitis unspecified   . Hypopotassemia   . Osteoporosis, unspecified   . Secondary hyperparathyroidism (of renal origin)   . Gout, unspecified   . Pneumonia, organism unspecified   . 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 Hemiplegia affecting unspecified side, late effect of cerebrovascular disease   . Headache   . Other and unspecified hyperlipidemia   . Nephropathy, diabetic     Current Outpatient Prescriptions  Medication Sig Dispense Refill  . albuterol (VENTOLIN HFA) 108 (90 BASE) MCG/ACT inhaler Inhale 2 puffs into the lungs every 6 (six) hours as needed.        Marland Kitchen allopurinol (ZYLOPRIM) 300 MG tablet TAKE 1 TABLET BY MOUTH DAILY  30 tablet  11  . aspirin 81 MG tablet Take 81 mg by mouth daily.        . Azelastine HCl (ASTEPRO) 0.15 % SOLN 2 sprays by Nasal route 2 (two) times daily as needed.        Marland Kitchen  bisoprolol (ZEBETA) 5 MG tablet Take 1 tablet (5 mg total) by mouth daily.  90 tablet  1  . budesonide-formoterol (SYMBICORT) 160-4.5 MCG/ACT inhaler Inhale 2 puffs into the lungs 2 (two) times daily.        . calcitRIOL (ROCALTROL) 0.25 MCG capsule TAKE 1 CAPSULE BY MOUTH DAILY  90 capsule  3  . Calcium-Magnesium-Vitamin D 185-50-100 MG-MG-UNIT CAPS Take 1 capsule by mouth daily.        . citalopram (CELEXA) 10 MG tablet Take 2 tablets (20 mg total) by mouth daily.  30 tablet  3  . CRESTOR 10 MG tablet TAKE 1 TABLET BY MOUTH ONCE DAILY  30 tablet  11  . ferrous sulfate 325 (65 FE) MG tablet Take 325 mg by mouth daily with breakfast.       . finasteride (PROSCAR) 5 MG tablet Take 1 tablet (5 mg total) by mouth daily.  30 tablet  11  . FOLBIC  2.5-25-2 MG TABS TAKE 1 TABLET BY MOUTH EVERY DAY  90 tablet  2  . furosemide (LASIX) 40 MG tablet 40 mg. Take 80 mg by mouth every morning and 40 mg every evening       . glucose blood test strip Four times a day, variable glucoses dx 250.03      . HYDROcodone-acetaminophen (VICODIN) 5-500 MG per tablet Take 1-2 tablets by mouth every 4 (four) hours as needed.  100 tablet  5  . insulin aspart (NOVOLOG) 100 UNIT/ML injection Inject into the skin 3 (three) times daily before meals. 3x a day (just before each meal) 10-02-09 units      . insulin NPH (HUMULIN N) 100 UNIT/ML injection Inject 10 Units into the skin at bedtime.       . INSULIN SYRINGE 1CC/29G (B-D INSULIN SYRINGE) 29G X 1/2" 1 ML MISC 4 (four) times daily. Dx 250.03      . isosorbide mononitrate (IMDUR) 60 MG 24 hr tablet Take 1 tablet (60 mg total) by mouth daily.  30 tablet  11  . Multiple Vitamins-Minerals (MULTIVITAMIN PO) Take 1 tablet by mouth daily.        . nitroGLYCERIN (NITROLINGUAL) 0.4 MG/SPRAY spray Place 1 spray under the tongue as directed.        Marland Kitchen omeprazole (PRILOSEC) 20 MG capsule TAKE ONE CAPSULE BY MOUTH DAILY  90 capsule  2  . polyethylene glycol powder (MIRALAX) powder Take 17 g by mouth as needed.        . Protein (UNJURY UNFLAVORED PO) Take 8 oz by mouth daily.        Marland Kitchen TAZTIA XT 180 MG 24 hr capsule TAKE ONE CAPSULE BY MOUTH DAILY  90 capsule  3  . tiotropium (SPIRIVA) 18 MCG inhalation capsule Place 18 mcg into inhaler and inhale daily.       . traZODone (DESYREL) 100 MG tablet TAKE 1 TABLET BY MOUTH EVERY NIGHT AT BEDTIME  90 tablet  2  . triamcinolone (KENALOG) 0.025 % cream APPLY THREE TIMES DAILY ON AFFECTED AREA AS NEEDED  80 g  1   Current Facility-Administered Medications  Medication Dose Route Frequency Provider Last Rate Last Dose  . DISCONTD: 0.9 %  sodium chloride infusion  250 mL Intravenous PRN Hadassah Pais, PA      . DISCONTD: sodium chloride 0.9 % injection 3 mL  3 mL Intravenous Q12H Hadassah Pais, PA      . DISCONTD: sodium chloride 0.9 % injection 3 mL  3  mL Intravenous PRN Hadassah Pais, PA         Allergies  Allergen Reactions  . Enalapril Maleate     REACTION: cough  . Shellfish-Derived Products Swelling    History   Social History  . Marital Status: Married    Spouse Name: N/A    Number of Children: Y  . Years of Education: N/A   Occupational History  .  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     Social History Main Topics  . Smoking status: Former Smoker -- 2.0 packs/day for 41 years    Types: Cigarettes    Quit date: 12/24/1997  . Smokeless tobacco: Former Neurosurgeon  . Alcohol Use: No  . Drug Use: No  . Sexually Active: Not on file   Other Topics Concern  . Not on file   Social History Narrative  . No narrative on file    Family History  Problem Relation Age of Onset  . Hypertension      family history of htn  . Heart disease Maternal Aunt   . Lung cancer Mother   . Cancer Mother     Lung Cancer  . Heart disease Father     CHF    PHYSICAL EXAM: Filed Vitals:   01/14/12 1352  BP: 130/56  Pulse: 64  Weight 215 (205) General:   No respiratory difficulty. Sitting in chair HEENT: normal Neck: supple. JVP hard to see probably 8-9 Carotids 2+ bilat; no bruits. No lymphadenopathy or thryomegaly appreciated. Cor: PMI nondisplaced. Regular rate & rhythm. No rubs, gallops or murmurs. Lungs: diminished in the bases Abdomen: obese soft, nontender, nondistended. No hepatosplenomegaly. No bruits or masses. Good bowel sounds. Extremities: no cyanosis, clubbing, rash, R >L  Neuro: alert & oriented x 3, cranial nerves grossly intact. moves all 4 extremities w/o difficulty. Normal affect    ASSESSMENT & PLAN:

## 2012-01-14 NOTE — Patient Instructions (Addendum)
Take Lasix 80 mg twice a day for the next 3 days  If your weight goes above 210 pounds take an extra Lasix   Do the following things EVERYDAY: 1) Weigh yourself in the morning before breakfast. Write it down and keep it in a log. 2) Take your medicines as prescribed 3) Eat low salt foods-Limit salt (sodium) to 2000mg  per day.  4) Stay as active as you can everyday  Follow up in 6-8 weeks.

## 2012-01-14 NOTE — Assessment & Plan Note (Addendum)
Reviewed recent cath with him - well compensated filling pressures and outputs. Weight now up 5-10 pounds. Will have him increase lasix to 80 bid x 3 days. Discussed sliding scale lasix to protect weight less than 210. PFTs reviewed and shows severe obstructive lung disease as well as restriction He had desaturation transferring to cath lab table last week. Based on these results, i suspect his lung disease (and not CHF) is by far the limiting factor in his functional capacity. Have stressed the need for pulmonary rehab.Time spent 45 minutes.

## 2012-01-15 DIAGNOSIS — I509 Heart failure, unspecified: Secondary | ICD-10-CM | POA: Diagnosis not present

## 2012-01-15 DIAGNOSIS — F329 Major depressive disorder, single episode, unspecified: Secondary | ICD-10-CM | POA: Diagnosis not present

## 2012-01-15 DIAGNOSIS — R269 Unspecified abnormalities of gait and mobility: Secondary | ICD-10-CM | POA: Diagnosis not present

## 2012-01-15 DIAGNOSIS — Z5189 Encounter for other specified aftercare: Secondary | ICD-10-CM | POA: Diagnosis not present

## 2012-01-15 DIAGNOSIS — I2510 Atherosclerotic heart disease of native coronary artery without angina pectoris: Secondary | ICD-10-CM | POA: Diagnosis not present

## 2012-01-15 DIAGNOSIS — J449 Chronic obstructive pulmonary disease, unspecified: Secondary | ICD-10-CM | POA: Diagnosis not present

## 2012-01-17 DIAGNOSIS — I509 Heart failure, unspecified: Secondary | ICD-10-CM | POA: Diagnosis not present

## 2012-01-17 DIAGNOSIS — J449 Chronic obstructive pulmonary disease, unspecified: Secondary | ICD-10-CM | POA: Diagnosis not present

## 2012-01-17 DIAGNOSIS — Z5189 Encounter for other specified aftercare: Secondary | ICD-10-CM | POA: Diagnosis not present

## 2012-01-17 DIAGNOSIS — R269 Unspecified abnormalities of gait and mobility: Secondary | ICD-10-CM | POA: Diagnosis not present

## 2012-01-17 DIAGNOSIS — F329 Major depressive disorder, single episode, unspecified: Secondary | ICD-10-CM | POA: Diagnosis not present

## 2012-01-17 DIAGNOSIS — I2510 Atherosclerotic heart disease of native coronary artery without angina pectoris: Secondary | ICD-10-CM | POA: Diagnosis not present

## 2012-01-18 ENCOUNTER — Other Ambulatory Visit: Payer: Self-pay | Admitting: Dermatology

## 2012-01-18 DIAGNOSIS — D239 Other benign neoplasm of skin, unspecified: Secondary | ICD-10-CM | POA: Diagnosis not present

## 2012-01-18 DIAGNOSIS — D485 Neoplasm of uncertain behavior of skin: Secondary | ICD-10-CM | POA: Diagnosis not present

## 2012-01-18 DIAGNOSIS — L570 Actinic keratosis: Secondary | ICD-10-CM | POA: Diagnosis not present

## 2012-01-18 DIAGNOSIS — D0439 Carcinoma in situ of skin of other parts of face: Secondary | ICD-10-CM | POA: Diagnosis not present

## 2012-01-18 DIAGNOSIS — L821 Other seborrheic keratosis: Secondary | ICD-10-CM | POA: Diagnosis not present

## 2012-01-18 DIAGNOSIS — Z85828 Personal history of other malignant neoplasm of skin: Secondary | ICD-10-CM | POA: Diagnosis not present

## 2012-01-21 ENCOUNTER — Other Ambulatory Visit: Payer: Self-pay | Admitting: Endocrinology

## 2012-01-21 DIAGNOSIS — J449 Chronic obstructive pulmonary disease, unspecified: Secondary | ICD-10-CM | POA: Diagnosis not present

## 2012-01-21 DIAGNOSIS — F329 Major depressive disorder, single episode, unspecified: Secondary | ICD-10-CM | POA: Diagnosis not present

## 2012-01-21 DIAGNOSIS — Z5189 Encounter for other specified aftercare: Secondary | ICD-10-CM | POA: Diagnosis not present

## 2012-01-21 DIAGNOSIS — R269 Unspecified abnormalities of gait and mobility: Secondary | ICD-10-CM | POA: Diagnosis not present

## 2012-01-21 DIAGNOSIS — I2510 Atherosclerotic heart disease of native coronary artery without angina pectoris: Secondary | ICD-10-CM | POA: Diagnosis not present

## 2012-01-21 DIAGNOSIS — I509 Heart failure, unspecified: Secondary | ICD-10-CM | POA: Diagnosis not present

## 2012-01-22 ENCOUNTER — Telehealth (HOSPITAL_COMMUNITY): Payer: Self-pay | Admitting: *Deleted

## 2012-01-22 NOTE — Telephone Encounter (Signed)
Dr Gala Romney reviewed pt's pft results would like for him to go ahead and see pulm MD sooner, pt aware and we will schudule with Dr Shelle Iron

## 2012-01-23 DIAGNOSIS — R269 Unspecified abnormalities of gait and mobility: Secondary | ICD-10-CM | POA: Diagnosis not present

## 2012-01-23 DIAGNOSIS — F329 Major depressive disorder, single episode, unspecified: Secondary | ICD-10-CM | POA: Diagnosis not present

## 2012-01-23 DIAGNOSIS — Z5189 Encounter for other specified aftercare: Secondary | ICD-10-CM | POA: Diagnosis not present

## 2012-01-23 DIAGNOSIS — I509 Heart failure, unspecified: Secondary | ICD-10-CM | POA: Diagnosis not present

## 2012-01-23 DIAGNOSIS — I2510 Atherosclerotic heart disease of native coronary artery without angina pectoris: Secondary | ICD-10-CM | POA: Diagnosis not present

## 2012-01-23 DIAGNOSIS — J449 Chronic obstructive pulmonary disease, unspecified: Secondary | ICD-10-CM | POA: Diagnosis not present

## 2012-01-25 ENCOUNTER — Ambulatory Visit: Payer: Medicare Other | Admitting: Endocrinology

## 2012-01-25 DIAGNOSIS — J449 Chronic obstructive pulmonary disease, unspecified: Secondary | ICD-10-CM | POA: Diagnosis not present

## 2012-01-25 DIAGNOSIS — F329 Major depressive disorder, single episode, unspecified: Secondary | ICD-10-CM | POA: Diagnosis not present

## 2012-01-25 DIAGNOSIS — I509 Heart failure, unspecified: Secondary | ICD-10-CM | POA: Diagnosis not present

## 2012-01-25 DIAGNOSIS — I2510 Atherosclerotic heart disease of native coronary artery without angina pectoris: Secondary | ICD-10-CM | POA: Diagnosis not present

## 2012-01-25 DIAGNOSIS — R269 Unspecified abnormalities of gait and mobility: Secondary | ICD-10-CM | POA: Diagnosis not present

## 2012-01-25 DIAGNOSIS — Z5189 Encounter for other specified aftercare: Secondary | ICD-10-CM | POA: Diagnosis not present

## 2012-01-28 ENCOUNTER — Ambulatory Visit: Payer: Medicare Other | Admitting: Endocrinology

## 2012-01-30 DIAGNOSIS — F329 Major depressive disorder, single episode, unspecified: Secondary | ICD-10-CM | POA: Diagnosis not present

## 2012-01-30 DIAGNOSIS — I2510 Atherosclerotic heart disease of native coronary artery without angina pectoris: Secondary | ICD-10-CM | POA: Diagnosis not present

## 2012-01-30 DIAGNOSIS — J449 Chronic obstructive pulmonary disease, unspecified: Secondary | ICD-10-CM | POA: Diagnosis not present

## 2012-01-30 DIAGNOSIS — I509 Heart failure, unspecified: Secondary | ICD-10-CM | POA: Diagnosis not present

## 2012-01-30 DIAGNOSIS — Z5189 Encounter for other specified aftercare: Secondary | ICD-10-CM | POA: Diagnosis not present

## 2012-01-30 DIAGNOSIS — R269 Unspecified abnormalities of gait and mobility: Secondary | ICD-10-CM | POA: Diagnosis not present

## 2012-02-01 ENCOUNTER — Telehealth (HOSPITAL_COMMUNITY): Payer: Self-pay | Admitting: *Deleted

## 2012-02-01 DIAGNOSIS — J449 Chronic obstructive pulmonary disease, unspecified: Secondary | ICD-10-CM | POA: Diagnosis not present

## 2012-02-01 DIAGNOSIS — Z5189 Encounter for other specified aftercare: Secondary | ICD-10-CM | POA: Diagnosis not present

## 2012-02-01 DIAGNOSIS — I2510 Atherosclerotic heart disease of native coronary artery without angina pectoris: Secondary | ICD-10-CM | POA: Diagnosis not present

## 2012-02-01 DIAGNOSIS — F329 Major depressive disorder, single episode, unspecified: Secondary | ICD-10-CM | POA: Diagnosis not present

## 2012-02-01 DIAGNOSIS — R269 Unspecified abnormalities of gait and mobility: Secondary | ICD-10-CM | POA: Diagnosis not present

## 2012-02-01 DIAGNOSIS — I509 Heart failure, unspecified: Secondary | ICD-10-CM | POA: Diagnosis not present

## 2012-02-01 MED ORDER — CITALOPRAM HYDROBROMIDE 20 MG PO TABS: 20.0000 mg | ORAL_TABLET | Freq: Every day | ORAL | Status: DC

## 2012-02-01 NOTE — Telephone Encounter (Signed)
Pt aware rx was sent in, he states he needs a note to get out of jury duty, he is scheduled for March 7th, juror # [phone] in [place], mail letter to [employer] request [address] [place] [address], will let Dr Gala Romney complete letter and let him know when complete

## 2012-02-01 NOTE — Telephone Encounter (Signed)
Mr [name] called today.  He needs a new script called in for his Celexa.  The pharmacy has a script that read 10 mg daily, he is taking 20 mg daily.  Also, Mr Delpilar has been called for [name] duty and is requesting a letter from [place] for an excuse so he will not have to go.  Please follow up with him.  Thanks.

## 2012-02-05 ENCOUNTER — Encounter: Payer: Self-pay | Admitting: Endocrinology

## 2012-02-05 ENCOUNTER — Ambulatory Visit (INDEPENDENT_AMBULATORY_CARE_PROVIDER_SITE_OTHER): Payer: Medicare Other | Admitting: Endocrinology

## 2012-02-05 ENCOUNTER — Encounter (HOSPITAL_COMMUNITY): Payer: Self-pay | Admitting: Internal Medicine

## 2012-02-05 ENCOUNTER — Other Ambulatory Visit (INDEPENDENT_AMBULATORY_CARE_PROVIDER_SITE_OTHER): Payer: Medicare Other

## 2012-02-05 DIAGNOSIS — N259 Disorder resulting from impaired renal tubular function, unspecified: Secondary | ICD-10-CM

## 2012-02-05 DIAGNOSIS — E1029 Type 1 diabetes mellitus with other diabetic kidney complication: Secondary | ICD-10-CM

## 2012-02-05 DIAGNOSIS — E109 Type 1 diabetes mellitus without complications: Secondary | ICD-10-CM | POA: Diagnosis not present

## 2012-02-05 DIAGNOSIS — N058 Unspecified nephritic syndrome with other morphologic changes: Secondary | ICD-10-CM

## 2012-02-05 DIAGNOSIS — N2581 Secondary hyperparathyroidism of renal origin: Secondary | ICD-10-CM

## 2012-02-05 DIAGNOSIS — M109 Gout, unspecified: Secondary | ICD-10-CM

## 2012-02-05 LAB — BASIC METABOLIC PANEL
BUN: 40 mg/dL — ABNORMAL HIGH (ref 6–23)
Calcium: 9.8 mg/dL (ref 8.4–10.5)
Creatinine, Ser: 1.5 mg/dL (ref 0.4–1.5)
GFR: 49.21 mL/min — ABNORMAL LOW (ref >60.00)

## 2012-02-05 LAB — HEMOGLOBIN A1C: Hgb A1c MFr Bld: 7 % — ABNORMAL HIGH (ref 4.6–6.5)

## 2012-02-05 NOTE — Progress Notes (Signed)
Subjective:    Patient ID: [name], male    DOB: 10-12-1941, 71 y.o.   MRN: [id number]  HPI The state of at least three ongoing medical problems is addressed today: Pt returns for f/u of insulin-requiring DM (1991).  He has been in the hospital for SOB, so he hasn't been able to f/u with bariatric surgery.  He is breathing better now.  no cbg record, but states cbg's are well-controlled.  No hypoglycemia. Secondary hyperparathyroidism:  Denies muscle weakness Gout:  No recent sxs. Past Medical History  Diagnosis Date  . Allergic rhinitis   . COPD (chronic obstructive pulmonary disease)     mild to moderate by pfts in 2006  . HLD (hyperlipidemia)   . Impotence   . PVD (peripheral vascular disease)   . Type II or unspecified type diabetes mellitus with renal manifestations, not stated as uncontrolled   . Renal insufficiency   . Osteoarthritis   . Cough     due to Zestril  . Encounter for long-term (current) use of aspirin   . Encounter for long-term (current) use of antiplatelets/antithrombotics   . Essential hypertension, benign   . Chronic diastolic heart failure   . Coronary atherosclerosis of native coronary artery   . Shortness of breath   . Other emphysema   . Edema   . Special screening for malignant neoplasm of prostate   . Conjunctivitis unspecified   . Hypopotassemia   . Osteoporosis, unspecified   . Secondary hyperparathyroidism (of renal origin)   . Gout, unspecified   . Pneumonia, organism unspecified   . 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 Hemiplegia affecting unspecified side, late effect of cerebrovascular disease   . Headache   . Other and unspecified hyperlipidemia   . Nephropathy, diabetic     Past Surgical History  Procedure Date  . Nasal sinus surgery   . Esophagogastroduodenoscopy   . Cardiac catheterization 01/18/2005  . Abdominal surgery   . Laparoscopic gastric banding  05/29/2011    History   Social History  . Marital Status: Married    Spouse Name: N/A    Number of Children: Y  . Years of Education: N/A   Occupational History  .  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     Social History Main Topics  . Smoking status: Former Smoker -- 2.0 packs/day for 41 years    Types: Cigarettes    Quit date: 12/24/1997  . Smokeless tobacco: Former Neurosurgeon  . Alcohol Use: No  . Drug Use: No  . Sexually Active: Not on file   Other Topics Concern  . Not on file   Social History Narrative  . No narrative on file    Current Outpatient Prescriptions on File Prior to Visit  Medication Sig Dispense Refill  . albuterol (VENTOLIN HFA) 108 (90 BASE) MCG/ACT inhaler Inhale 2 puffs into the lungs every 6 (six) hours as needed.        Marland Kitchen allopurinol (ZYLOPRIM) 300 MG tablet TAKE 1 TABLET BY MOUTH DAILY  30 tablet  11  . aspirin 81 MG tablet Take 81 mg by mouth daily.        . Azelastine HCl (ASTEPRO) 0.15 % SOLN 2 sprays by Nasal route 2 (two) times daily as needed.        . bisoprolol (ZEBETA) 5 MG tablet TAKE 1 TABLET BY MOUTH DAILY  90 tablet  2  . budesonide-formoterol (SYMBICORT)  160-4.5 MCG/ACT inhaler Inhale 2 puffs into the lungs 2 (two) times daily.        . calcitRIOL (ROCALTROL) 0.25 MCG capsule TAKE 1 CAPSULE BY MOUTH DAILY  90 capsule  3  . Calcium-Magnesium-Vitamin D 185-50-100 MG-MG-UNIT CAPS Take 1 capsule by mouth daily.        . citalopram (CELEXA) 20 MG tablet Take 1 tablet (20 mg total) by mouth daily.  30 tablet  3  . CRESTOR 10 MG tablet TAKE 1 TABLET BY MOUTH ONCE DAILY  30 tablet  11  . ferrous sulfate 325 (65 FE) MG tablet Take 325 mg by mouth daily with breakfast.       . finasteride (PROSCAR) 5 MG tablet Take 1 tablet (5 mg total) by mouth daily.  30 tablet  11  . FOLBIC 2.5-25-2 MG TABS TAKE 1 TABLET BY MOUTH EVERY DAY  90 tablet  2  . furosemide (LASIX) 40 MG tablet 40 mg. Take 80 mg by mouth every morning and 40 mg every evening       . glucose blood test  strip Four times a day, variable glucoses dx 250.03      . HYDROcodone-acetaminophen (VICODIN) 5-500 MG per tablet Take 1-2 tablets by mouth every 4 (four) hours as needed.  100 tablet  5  . insulin aspart (NOVOLOG) 100 UNIT/ML injection Inject into the skin 3 (three) times daily before meals. 3x a day (just before each meal) 10-02-09 units      . insulin NPH (HUMULIN N) 100 UNIT/ML injection Inject 10 Units into the skin at bedtime.       . INSULIN SYRINGE 1CC/29G (B-D INSULIN SYRINGE) 29G X 1/2" 1 ML MISC 4 (four) times daily. Dx 250.03      . isosorbide mononitrate (IMDUR) 60 MG 24 hr tablet Take 1 tablet (60 mg total) by mouth daily.  30 tablet  11  . Multiple Vitamins-Minerals (MULTIVITAMIN PO) Take 1 tablet by mouth daily.        . nitroGLYCERIN (NITROLINGUAL) 0.4 MG/SPRAY spray Place 1 spray under the tongue as directed.        Marland Kitchen omeprazole (PRILOSEC) 20 MG capsule TAKE ONE CAPSULE BY MOUTH DAILY  90 capsule  2  . polyethylene glycol powder (MIRALAX) powder Take 17 g by mouth as needed.        . Protein (UNJURY UNFLAVORED PO) Take 8 oz by mouth daily.        Marland Kitchen TAZTIA XT 180 MG 24 hr capsule TAKE ONE CAPSULE BY MOUTH DAILY  90 capsule  3  . tiotropium (SPIRIVA) 18 MCG inhalation capsule Place 18 mcg into inhaler and inhale daily.       . traZODone (DESYREL) 100 MG tablet TAKE 1 TABLET BY MOUTH EVERY NIGHT AT BEDTIME  90 tablet  2  . triamcinolone (KENALOG) 0.025 % cream APPLY THREE TIMES DAILY ON AFFECTED AREA AS NEEDED  80 g  1    Allergies  Allergen Reactions  . Enalapril Maleate     REACTION: cough  . Shellfish-Derived Products Swelling    Family History  Problem Relation Age of Onset  . Hypertension      family history of htn  . Heart disease Maternal Aunt   . Lung cancer Mother   . Cancer Mother     Lung Cancer  . Heart disease Father     CHF    BP 128/64  Pulse 70  Temp(Src) 97.9 F (36.6 C) (Oral)  Ht 5'  3" (1.6 m)  Wt 215 lb 12.8 oz (97.886 kg)  BMI 38.23 kg/m2   SpO2 93%    Review of Systems No weight change    Objective:   Physical Exam VITAL SIGNS:  See vs page GENERAL: no distress Pulses: dorsalis pedis intact bilat.   Feet: no deformity.  no ulcer on the feet.  feet are of normal color and temp.  no edema Neuro: sensation is intact to touch on the feet.    Lab Results  Component Value Date   HGBA1C 7.0* 02/05/2012  uric acid=4.4    Assessment & Plan:  DM is well-controlled Obesity, needs increased rx Gout is well-controlled Secondary hyperparathyroidism, well-controlled

## 2012-02-05 NOTE — Patient Instructions (Signed)
check your blood sugar 2 times a day.  vary the time of day when you check, between before the 3 meals, and at bedtime.  also check if you have symptoms of your blood sugar being too high or too low.  please keep a record of the readings and bring it to your next appointment here.  please call us sooner if you are having low blood sugar episodes, or if it stays over 200.   Please go back to see dr Ezzard Standing.   If he adjusts the gastric band, please reduce each insulin injection by 5 units.  Call if you have more questions about your blood sugar, as your insulin needs may decrease more.   Please come back for a follow-up appointment in 3 months.

## 2012-02-06 LAB — PTH, INTACT AND CALCIUM: PTH: 24.2 pg/mL (ref 14.0–72.0)

## 2012-02-07 NOTE — Telephone Encounter (Signed)
Letter done and mailed, pt is aware, copy also mailed to him

## 2012-02-08 ENCOUNTER — Encounter: Payer: Self-pay | Admitting: Pulmonary Disease

## 2012-02-08 ENCOUNTER — Ambulatory Visit (INDEPENDENT_AMBULATORY_CARE_PROVIDER_SITE_OTHER): Payer: Medicare Other | Admitting: Pulmonary Disease

## 2012-02-08 VITALS — BP 136/78 | HR 68 | Temp 97.8°F | Ht 63.0 in | Wt 218.4 lb

## 2012-02-08 DIAGNOSIS — J449 Chronic obstructive pulmonary disease, unspecified: Secondary | ICD-10-CM | POA: Diagnosis not present

## 2012-02-08 NOTE — Patient Instructions (Signed)
Continue on current medications Will refer you to pulmonary rehab Continue to work on weight loss Keep followup with me in May.

## 2012-02-08 NOTE — Assessment & Plan Note (Signed)
The patient has known moderate COPD, and he is already on a very aggressive bronchodilator regimen.  There is really nothing to add at this point.  However, we can work on improving his conditioning and exercise tolerance, and will refer the patient to pulmonary rehabilitation.  I have asked him to continue on his current medications, and to work aggressively on weight loss.  He does have some mild restriction on his recent PFTs, and suspect this is due to his centripetal obesity.

## 2012-02-08 NOTE — Progress Notes (Signed)
  Subjective:    Patient ID: [name], male    DOB: [birth year], 71 y.o.   MRN: [id number]  HPI The patient comes in today for followup of his known COPD.  He was doing fairly well with his exercise program until November of last year, when he began to have worsening lower extremity edema and worsening shortness of breath.  He did not have chest congestion or symptoms of a respiratory infection during this time.  His breathing did very poorly for all of November and December, but the last 2 months has begun to improve.  He continues to have some lower extremity edema, but that is much better.  He has had a cardiac workup with no reversible process being found.  He is continuing on his diuretic regimen.  The patient is staying on his good bronchodilator regimen, and currently denies any cough or congestion.   Review of Systems  Constitutional: Negative for fever and unexpected weight change.  HENT: Positive for congestion, sore throat, rhinorrhea and sneezing. Negative for ear pain, nosebleeds, trouble swallowing, dental problem, postnasal drip and sinus pressure.   Eyes: Negative for redness and itching.  Respiratory: Positive for chest tightness, shortness of breath and wheezing. Negative for cough.   Cardiovascular: Positive for leg swelling. Negative for palpitations.  Gastrointestinal: Negative for nausea and vomiting.  Genitourinary: Negative for dysuria.  Musculoskeletal: Negative for joint swelling.  Skin: Negative for rash.  Neurological: Positive for headaches.  Hematological: Does not bruise/bleed easily.  Psychiatric/Behavioral: Positive for dysphoric mood. The patient is not nervous/anxious.        Objective:   Physical Exam Obese male in no acute distress Nose without purulence or discharge noted Chest with mild decrease in breath sounds, basilar crackles, good air flow with no wheezing Cardiac exam is regular rate and rhythm Lower extremities with mild edema, no  cyanosis Alert and oriented, moves all 4 extremities.       Assessment & Plan:

## 2012-02-12 ENCOUNTER — Ambulatory Visit: Payer: Medicare Other | Admitting: *Deleted

## 2012-02-13 ENCOUNTER — Encounter: Payer: Medicare Other | Attending: Surgery | Admitting: *Deleted

## 2012-02-13 DIAGNOSIS — Z09 Encounter for follow-up examination after completed treatment for conditions other than malignant neoplasm: Secondary | ICD-10-CM | POA: Diagnosis not present

## 2012-02-13 DIAGNOSIS — Z9884 Bariatric surgery status: Secondary | ICD-10-CM | POA: Diagnosis not present

## 2012-02-13 DIAGNOSIS — Z713 Dietary counseling and surveillance: Secondary | ICD-10-CM | POA: Diagnosis not present

## 2012-02-13 NOTE — Progress Notes (Signed)
  Follow-up visit: 9 Months Post-Operative LAGB Surgery  Medical Nutrition Therapy:  Appt start time: 12:30  end time:  1:00.  Assessment:  Primary concerns today: post-operative bariatric surgery nutrition management.  [name] returns for his 9 mo post-op LAGB nutrition f/u. He reports continued issues with edema and has been drinking carbonated drinks daily.  Discussed discontinuing diet sodas and increasing water to help with reported constipation. Has only had 1 fill since surgery, with next one scheduled for 03/20/12.  Surgery date: 05/15/11 Start weight at [facility]: 238.6 lbs (09/25/10) Weight goal: 160-180 lbs  Weight today: 216.5 lbs Weight change: +2.3 lbs Total weight lost: 29.5 lbs  BMI: 38.3% %Goal: ~50%  24-hr recall: Sips on water all other times. B (8-9 AM): Protein shake + [place] yogurt  Snk (11 AM): None  L (1 PM): Sandwich and half of peanut butter & banana; diet soda (12 oz) Snk (PM): N/A or orange D (6 PM): 3 oz lean meat, cottage cheese (LF), pickled green beans (handful); diet coke (12 oz) Snk (9-10 PM): Rye toast (2) w/ butter and hot chocolate (reduced sugar)  Fluid intake: water, crystal light, unsweetened tea, protein shake = 64 oz Estimated total protein intake: 65-70g   Medications: See updated medication list; reports no changes. Supplementation: Taking regularly (no calcium citrate per MD)  CBG monitoring: Daily Average CBG per patient: No glucose log available, but pt states his sugars are "better".  Lab Results  Component Value Date   HGBA1C 7.0* 02/05/2012    Using straws: No Drinking while eating: Yes (discussed) Hair loss: No Carbonated beverages: Yes (discussed) N/V/D/C: Constipation  Last Lap-Band fill: One fill; 9/12. Next fill scheduled for 03/20/12.  Recent physical activity:  [name] has been very energetic and reports high activity until end of October when he had his episode with fluid retention and cardiac protein.  He is currently able to  walk "some" until he is out of breath.  Progress Towards Goal(s):  In progress.   Nutritional Diagnosis:  Geiger-3.3 Obesity related to recent LAGB surgery as evidenced by pt following LAGB dietary guidelines for continued weight loss.    Intervention:  Nutrition education.  Monitoring/Evaluation:  Dietary intake, exercise, lap band fills, and body weight. Follow up in 3 months for 12 month post-op visit.

## 2012-02-13 NOTE — Patient Instructions (Addendum)
Goals:  Continue to follow Phase 3B: High Protein + Non-Starchy Vegetables  Eat 3-6 small meals/snacks, every 3-5 hrs  Increase lean protein foods to meet previous goal  Increase fluid intake to 64oz +  Add 15 grams of carbohydrate (fruit, whole grain, starchy vegetable) with meals  Avoid drinking 15 minutes before, during and 30 minutes after eating  Aim for >30 min of physical activity daily

## 2012-02-14 ENCOUNTER — Telehealth: Payer: Self-pay | Admitting: Pulmonary Disease

## 2012-02-14 NOTE — Telephone Encounter (Signed)
[email or web address] x1 

## 2012-02-15 ENCOUNTER — Encounter: Payer: Self-pay | Admitting: *Deleted

## 2012-02-15 MED ORDER — AZELASTINE HCL 0.15 % NA SOLN: NASAL | Status: DC

## 2012-02-15 NOTE — Telephone Encounter (Signed)
Pt requesting rxs for astepro -- states he only uses this once daily.  Rx sent to [facility] -- pt aware.

## 2012-02-22 DIAGNOSIS — D0439 Carcinoma in situ of skin of other parts of face: Secondary | ICD-10-CM | POA: Diagnosis not present

## 2012-02-25 ENCOUNTER — Other Ambulatory Visit: Payer: Self-pay | Admitting: Endocrinology

## 2012-02-25 ENCOUNTER — Ambulatory Visit (HOSPITAL_COMMUNITY)
Admission: RE | Admit: 2012-02-25 | Discharge: 2012-02-25 | Disposition: A | Discharge status: Home / Self Care | Payer: Medicare Other | Source: Ambulatory Visit | Attending: Internal Medicine | Admitting: Internal Medicine

## 2012-02-25 VITALS — BP 122/50 | HR 65 | Wt 218.0 lb

## 2012-02-25 DIAGNOSIS — Z7982 Long term (current) use of aspirin: Secondary | ICD-10-CM | POA: Diagnosis not present

## 2012-02-25 DIAGNOSIS — I2510 Atherosclerotic heart disease of native coronary artery without angina pectoris: Secondary | ICD-10-CM | POA: Insufficient documentation

## 2012-02-25 DIAGNOSIS — N189 Chronic kidney disease, unspecified: Secondary | ICD-10-CM | POA: Diagnosis not present

## 2012-02-25 DIAGNOSIS — I509 Heart failure, unspecified: Secondary | ICD-10-CM | POA: Diagnosis not present

## 2012-02-25 DIAGNOSIS — I69959 Hemiplegia and hemiparesis following unspecified cerebrovascular disease affecting unspecified side: Secondary | ICD-10-CM | POA: Diagnosis not present

## 2012-02-25 DIAGNOSIS — M1990 Unspecified osteoarthritis, unspecified site: Secondary | ICD-10-CM | POA: Diagnosis not present

## 2012-02-25 DIAGNOSIS — I129 Hypertensive chronic kidney disease with stage 1 through stage 4 chronic kidney disease, or unspecified chronic kidney disease: Secondary | ICD-10-CM | POA: Diagnosis not present

## 2012-02-25 DIAGNOSIS — Z87891 Personal history of nicotine dependence: Secondary | ICD-10-CM | POA: Insufficient documentation

## 2012-02-25 DIAGNOSIS — Z794 Long term (current) use of insulin: Secondary | ICD-10-CM | POA: Diagnosis not present

## 2012-02-25 DIAGNOSIS — N058 Unspecified nephritic syndrome with other morphologic changes: Secondary | ICD-10-CM | POA: Diagnosis not present

## 2012-02-25 DIAGNOSIS — E785 Hyperlipidemia, unspecified: Secondary | ICD-10-CM | POA: Diagnosis not present

## 2012-02-25 DIAGNOSIS — E1129 Type 2 diabetes mellitus with other diabetic kidney complication: Secondary | ICD-10-CM | POA: Diagnosis not present

## 2012-02-25 DIAGNOSIS — I5032 Chronic diastolic (congestive) heart failure: Secondary | ICD-10-CM | POA: Insufficient documentation

## 2012-02-25 DIAGNOSIS — Z9884 Bariatric surgery status: Secondary | ICD-10-CM | POA: Insufficient documentation

## 2012-02-25 DIAGNOSIS — Z79899 Other long term (current) drug therapy: Secondary | ICD-10-CM | POA: Diagnosis not present

## 2012-02-25 DIAGNOSIS — J438 Other emphysema: Secondary | ICD-10-CM | POA: Diagnosis not present

## 2012-02-25 DIAGNOSIS — E669 Obesity, unspecified: Secondary | ICD-10-CM | POA: Insufficient documentation

## 2012-02-25 DIAGNOSIS — I739 Peripheral vascular disease, unspecified: Secondary | ICD-10-CM | POA: Insufficient documentation

## 2012-02-25 DIAGNOSIS — Z801 Family history of malignant neoplasm of trachea, bronchus and lung: Secondary | ICD-10-CM | POA: Diagnosis not present

## 2012-02-25 LAB — BASIC METABOLIC PANEL
BUN: 33 mg/dL — ABNORMAL HIGH (ref 6–23)
Calcium: 9.7 mg/dL (ref 8.4–10.5)
GFR calc Af Amer: 54 mL/min — ABNORMAL LOW (ref >90)
GFR calc non Af Amer: 47 mL/min — ABNORMAL LOW (ref >90)
Potassium: 4.4 mEq/L (ref 3.5–5.1)
Sodium: 137 mEq/L (ref 135–145)

## 2012-02-25 MED ORDER — FUROSEMIDE 40 MG PO TABS: 80.0000 mg | ORAL_TABLET | Freq: Two times a day (BID) | ORAL | Status: DC

## 2012-02-25 NOTE — Telephone Encounter (Signed)
Rx faxed to [facility]. 

## 2012-02-25 NOTE — Progress Notes (Signed)
Patient ID: [name], male   DOB: [birth year], 71 y.o.   MRN: [id number]  HPI:  [name] is a 71 yo WM with h/o CAD, obesity s/p Lap Band June 2012, diastolic CHF (EF45-50%), HTN,CRI (basline about 1.5-1.8 - follows with Dr. Lowell Guitar), hyperlipidemia, PAD, DM referred to HF clinic by Dr. Clifton James for help with CHF management.  He has CAD with multiple prior PCI procedures. Stress myoview in February 2012 with no ischemia. This was done as part of the pre-operative workup before a planned lap band surgery. Recent echo with EF 45-50% + grade 2 diastolic dysfunction. RV normal  He has been followed closely by Dr. Clifton James. Referred to HF clinic recently for help with CHF.  12/28 RA = 8 RV = 45/7/13 PA = 41/14 (27) PCW = 13 Fick cardiac output/index = 4.8/2.5  PVR = 2.9 FA sat = 97% (2L) PA sat = 64%, 64%   01/07/12 PFTs= Severe air flow limitation. + Restrictive lung disease. FEV1 0.91L (39%) FVC 1.38 (43%) FEV1/FVC 74% FEF 25-75% 0.44 (24%) DLCO 52%   He returns for follow up. Breathing better. Increased energy. Weight at home 213-216. Taking Lasix 80 mg bid. Tried to take Lasix 80 mg in am and 40 mg in pm but he continued to have lower extremity edema so increased to 80 bid.  PT/OT completed. Walking without cane.   + mild edema RLE>LLE.  He has not started to Pulmonary Rehab. (pending) Compliant with medications but not compliant with low salt diet. Uses oxygen at night.    ROS: All other systems normal except as listed in the HPI and Problem List.     Past Medical History  Diagnosis Date  . Allergic rhinitis   . COPD (chronic obstructive pulmonary disease)     mild to moderate by pfts in 2006  . HLD (hyperlipidemia)   . Impotence   . PVD (peripheral vascular disease)   . Type II or unspecified type diabetes mellitus with renal manifestations, not stated as uncontrolled   . Renal insufficiency   . Osteoarthritis   . Cough     due to Zestril  . Encounter for long-term (current) use of  aspirin   . Encounter for long-term (current) use of antiplatelets/antithrombotics   . Essential hypertension, benign   . Chronic diastolic heart failure   . Coronary atherosclerosis of native coronary artery   . Shortness of breath   . Other emphysema   . Edema   . Special screening for malignant neoplasm of prostate   . Conjunctivitis unspecified   . Hypopotassemia   . Osteoporosis, unspecified   . Secondary hyperparathyroidism (of renal origin)   . Gout, unspecified   . Pneumonia, organism unspecified   . 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 Hemiplegia affecting unspecified side, late effect of cerebrovascular disease   . Headache   . Other and unspecified hyperlipidemia   . Nephropathy, diabetic     Current Outpatient Prescriptions  Medication Sig Dispense Refill  . albuterol (VENTOLIN HFA) 108 (90 BASE) MCG/ACT inhaler Inhale 2 puffs into the lungs every 6 (six) hours as needed.        Marland Kitchen allopurinol (ZYLOPRIM) 300 MG tablet TAKE 1 TABLET BY MOUTH DAILY  30 tablet  11  . aspirin 81 MG tablet Take 81 mg by mouth daily.        . Azelastine HCl (ASTEPRO) 0.15 % SOLN 2 sprays each nostril each am  as needed  30 mL  2  . bisoprolol (ZEBETA) 5 MG tablet TAKE 1 TABLET BY MOUTH DAILY  90 tablet  2  . budesonide-formoterol (SYMBICORT) 160-4.5 MCG/ACT inhaler Inhale 2 puffs into the lungs 2 (two) times daily.        . calcitRIOL (ROCALTROL) 0.25 MCG capsule TAKE 1 CAPSULE BY MOUTH DAILY  90 capsule  3  . Calcium-Magnesium-Vitamin D 185-50-100 MG-MG-UNIT CAPS Take 1 capsule by mouth daily.        . citalopram (CELEXA) 20 MG tablet Take 1 tablet (20 mg total) by mouth daily.  30 tablet  3  . CRESTOR 10 MG tablet TAKE 1 TABLET BY MOUTH ONCE DAILY  30 tablet  11  . ferrous sulfate 325 (65 FE) MG tablet Take 325 mg by mouth daily with breakfast.       . finasteride (PROSCAR) 5 MG tablet Take 1 tablet (5 mg total) by mouth  daily.  30 tablet  11  . FOLBIC 2.5-25-2 MG TABS TAKE 1 TABLET BY MOUTH EVERY DAY  90 tablet  2  . furosemide (LASIX) 40 MG tablet Take 2 tablets (80 mg total) by mouth 2 (two) times daily. Take 80 mg by mouth every morning and 40 mg every evening  60 tablet  6  . glucose blood test strip Four times a day, variable glucoses dx 250.03      . insulin aspart (NOVOLOG) 100 UNIT/ML injection Inject into the skin 3 (three) times daily before meals. 3x a day (just before each meal) 10-02-09 units      . insulin NPH (HUMULIN N) 100 UNIT/ML injection Inject 10 Units into the skin at bedtime.       . INSULIN SYRINGE 1CC/29G (B-D INSULIN SYRINGE) 29G X 1/2" 1 ML MISC 4 (four) times daily. Dx 250.03      . isosorbide mononitrate (IMDUR) 60 MG 24 hr tablet Take 1 tablet (60 mg total) by mouth daily.  30 tablet  11  . Multiple Vitamins-Minerals (MULTIVITAMIN PO) Take 1 tablet by mouth daily.        . nitroGLYCERIN (NITROLINGUAL) 0.4 MG/SPRAY spray Place 1 spray under the tongue as directed.        Marland Kitchen omeprazole (PRILOSEC) 20 MG capsule TAKE ONE CAPSULE BY MOUTH DAILY  90 capsule  2  . polyethylene glycol powder (MIRALAX) powder Take 17 g by mouth as needed.        . Protein (UNJURY UNFLAVORED PO) Take 8 oz by mouth daily.        Marland Kitchen TAZTIA XT 180 MG 24 hr capsule TAKE ONE CAPSULE BY MOUTH DAILY  90 capsule  3  . tiotropium (SPIRIVA) 18 MCG inhalation capsule Place 18 mcg into inhaler and inhale daily.       . traZODone (DESYREL) 100 MG tablet TAKE 1 TABLET BY MOUTH EVERY NIGHT AT BEDTIME  90 tablet  2  . triamcinolone (KENALOG) 0.025 % cream APPLY THREE TIMES DAILY ON AFFECTED AREA AS NEEDED  80 g  1  . HYDROcodone-acetaminophen (VICODIN) 5-500 MG per tablet TAKE 1 TO 2 TABLETS BY MOUTH EVERY 4 HOURS AS NEEDED  100 tablet  0     Allergies  Allergen Reactions  . Enalapril Maleate     REACTION: cough  . Shellfish-Derived Products Swelling    History   Social History  . Marital Status: Married    Spouse  Name: N/A    Number of Children: Y  . Years of Education: N/A  Occupational History  .  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     Social History Main Topics  . Smoking status: Former Smoker -- 2.0 packs/day for 41 years    Types: Cigarettes    Quit date: 12/24/1997  . Smokeless tobacco: Former Neurosurgeon  . Alcohol Use: No  . Drug Use: No  . Sexually Active: Not on file   Other Topics Concern  . Not on file   Social History Narrative  . No narrative on file    Family History  Problem Relation Age of Onset  . Hypertension      family history of htn  . Heart disease Maternal Aunt   . Lung cancer Mother   . Cancer Mother     Lung Cancer  . Heart disease Father     CHF    PHYSICAL EXAM: Filed Vitals:   02/25/12 1145  BP: 122/50  Pulse: 65  Weight 218 (215) General:   No respiratory difficulty. Sitting in chair HEENT: normal Neck: supple. JVP hard to see probably 8-9 Carotids 2+ bilat; no bruits. No lymphadenopathy or thryomegaly appreciated. Cor: PMI nondisplaced. Regular rate & rhythm. No rubs, gallops or murmurs. Lungs: diminished in the bases Abdomen: obese soft, nontender, nondistended. No hepatosplenomegaly. No bruits or masses. Good bowel sounds. Extremities: no cyanosis, clubbing, rash, no edema Neuro: alert & oriented x 3, cranial nerves grossly intact. moves all 4 extremities w/o difficulty. Normal affect    ASSESSMENT & PLAN:

## 2012-02-25 NOTE — Patient Instructions (Addendum)
Continue Lasix 80 mg twice a day  Follow up in 2 month  Do the following things EVERYDAY: 1) Weigh yourself in the morning before breakfast. Write it down and keep it in a log. 2) Take your medicines as prescribed 3) Eat low salt foods--Limit salt (sodium) to 2000mg  per day.  4) Stay as active as you can everyday

## 2012-02-25 NOTE — Assessment & Plan Note (Addendum)
Volume status stable. Continue Lasix 80 mg twice a day. Follow up in 2 months. Reinforced need for daily weights and reviewed use of sliding scale diuretics.

## 2012-02-26 DIAGNOSIS — H2510 Age-related nuclear cataract, unspecified eye: Secondary | ICD-10-CM | POA: Diagnosis not present

## 2012-02-26 DIAGNOSIS — E1139 Type 2 diabetes mellitus with other diabetic ophthalmic complication: Secondary | ICD-10-CM | POA: Diagnosis not present

## 2012-02-29 ENCOUNTER — Telehealth (HOSPITAL_COMMUNITY): Payer: Self-pay | Admitting: *Deleted

## 2012-02-29 NOTE — Telephone Encounter (Signed)
Clarified Lasix order 80 mg twice a day.

## 2012-02-29 NOTE — Telephone Encounter (Signed)
Foye Clock from [facility] called regarding clarification on furosemide.  Please call them back.

## 2012-03-17 ENCOUNTER — Encounter: Payer: Self-pay | Admitting: Cardiovascular Disease

## 2012-03-17 ENCOUNTER — Ambulatory Visit (INDEPENDENT_AMBULATORY_CARE_PROVIDER_SITE_OTHER): Payer: Medicare Other | Admitting: Cardiovascular Disease

## 2012-03-17 VITALS — BP 120/58 | HR 60 | Ht 63.0 in | Wt 225.8 lb

## 2012-03-17 DIAGNOSIS — I2510 Atherosclerotic heart disease of native coronary artery without angina pectoris: Secondary | ICD-10-CM

## 2012-03-17 DIAGNOSIS — I5032 Chronic diastolic (congestive) heart failure: Secondary | ICD-10-CM

## 2012-03-17 DIAGNOSIS — I509 Heart failure, unspecified: Secondary | ICD-10-CM | POA: Diagnosis not present

## 2012-03-17 NOTE — Patient Instructions (Signed)
Your physician wants you to follow-up in:  12 months.  You will receive a reminder letter in the mail two months in advance. If you don't receive a letter, please call our office to schedule the follow-up appointment.   

## 2012-03-17 NOTE — Assessment & Plan Note (Signed)
His weight is up six pounds per his home scale over last week. Will have him take Lasix 80 mg po TID for 2 days then go back to 80 mg po BID. He will eat an extra banana. Follow weight. Alert CHF clinic if weight does not return to baseline or if he begins to notice increased lower ext edema or SOB.

## 2012-03-17 NOTE — Progress Notes (Signed)
History of Present Illness: 71 yo WM with h/o CAD, diastolic CHF, HTN,CRI, hyperlipidemia, PAD, DM here today for cardiac follow up. He has been followed in the past by Dr. [name]. I saw him for the first time in March 2012. He has had multiple prior PCI procedures. He has chronic diastolic heart failure. Stress myoview in February 2012 with no ischemia. This was done as part of the pre-operative workup before a planned lap band surgery. He has had issues with volume overload over the last 6 months with lower ext edema and SOB over the last few months. His diuretics were adjusted but unfortunately he had acute worsening of his renal function with higher doses of diuretics. He was referred to the CHF clinic and has been following in the CHF clinic with Dr. Gala Romney. He has been taking Lasix and compliant with all medications but still not compliant with his diet. He was last seen in the CHF clinic on 02/25/12. His COPD is followed in the pulmonary clinic by Dr. Shelle Iron.   He is here today for follow up. He was at the beach for two weeks and has been walking without chest pains. His breathing has been stable. His weight has been up a few pounds over the last two weeks. He follows it closely at home.   Primary Care Physician: [name]  Last Lipid Profile: October 2012 with Total chol 133, LDL 66  HDL 48  Past Medical History  Diagnosis Date  . Allergic rhinitis   . COPD (chronic obstructive pulmonary disease)     mild to moderate by pfts in 2006  . HLD (hyperlipidemia)   . Impotence   . PVD (peripheral vascular disease)   . Type II or unspecified type diabetes mellitus with renal manifestations, not stated as uncontrolled   . Renal insufficiency   . Osteoarthritis   . Cough     due to Zestril  . Encounter for long-term (current) use of aspirin   . Encounter for long-term (current) use of antiplatelets/antithrombotics   . Essential hypertension, benign   . Chronic diastolic heart failure     . Coronary atherosclerosis of native coronary artery   . Shortness of breath   . Other emphysema   . Edema   . Special screening for malignant neoplasm of prostate   . Conjunctivitis unspecified   . Hypopotassemia   . Osteoporosis, unspecified   . Secondary hyperparathyroidism (of renal origin)   . Gout, unspecified   . Pneumonia, organism unspecified   . 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 Hemiplegia affecting unspecified side, late effect of cerebrovascular disease   . Headache   . Other and unspecified hyperlipidemia   . Nephropathy, diabetic     Past Surgical History  Procedure Date  . Nasal sinus surgery   . Esophagogastroduodenoscopy   . Cardiac catheterization 01/18/2005  . Abdominal surgery   . Laparoscopic gastric banding 05/29/2011    Current Outpatient Prescriptions  Medication Sig Dispense Refill  . albuterol (VENTOLIN HFA) 108 (90 BASE) MCG/ACT inhaler Inhale 2 puffs into the lungs every 6 (six) hours as needed.        Marland Kitchen allopurinol (ZYLOPRIM) 300 MG tablet TAKE 1 TABLET BY MOUTH DAILY  30 tablet  11  . aspirin 81 MG tablet Take 81 mg by mouth daily.        . Azelastine HCl (ASTEPRO) 0.15 % SOLN 2 sprays each nostril each am  as needed  30 mL  2  . bisoprolol (ZEBETA) 5 MG tablet TAKE 1 TABLET BY MOUTH DAILY  90 tablet  2  . budesonide-formoterol (SYMBICORT) 160-4.5 MCG/ACT inhaler Inhale 2 puffs into the lungs 2 (two) times daily.        . calcitRIOL (ROCALTROL) 0.25 MCG capsule TAKE 1 CAPSULE BY MOUTH DAILY  90 capsule  3  . Calcium-Magnesium-Vitamin D 185-50-100 MG-MG-UNIT CAPS Take 1 capsule by mouth daily.        . citalopram (CELEXA) 20 MG tablet Take 1 tablet (20 mg total) by mouth daily.  30 tablet  3  . CRESTOR 10 MG tablet TAKE 1 TABLET BY MOUTH ONCE DAILY  30 tablet  11  . ferrous sulfate 325 (65 FE) MG tablet Take 325 mg by mouth daily with breakfast.       . finasteride (PROSCAR) 5  MG tablet Take 1 tablet (5 mg total) by mouth daily.  30 tablet  11  . FOLBIC 2.5-25-2 MG TABS TAKE 1 TABLET BY MOUTH EVERY DAY  90 tablet  2  . furosemide (LASIX) 40 MG tablet Take 2 tablets (80 mg total) by mouth 2 (two) times daily. Take 80 mg by mouth every morning and 40 mg every evening  60 tablet  6  . glucose blood test strip Four times a day, variable glucoses dx 250.03      . HYDROcodone-acetaminophen (VICODIN) 5-500 MG per tablet TAKE 1 TO 2 TABLETS BY MOUTH EVERY 4 HOURS AS NEEDED  100 tablet  0  . insulin aspart (NOVOLOG) 100 UNIT/ML injection Inject into the skin 3 (three) times daily before meals. 3x a day (just before each meal) 10-02-09 units      . insulin NPH (HUMULIN N) 100 UNIT/ML injection Inject 10 Units into the skin at bedtime.       . INSULIN SYRINGE 1CC/29G (B-D INSULIN SYRINGE) 29G X 1/2" 1 ML MISC 4 (four) times daily. Dx 250.03      . isosorbide mononitrate (IMDUR) 60 MG 24 hr tablet Take 1 tablet (60 mg total) by mouth daily.  30 tablet  11  . Multiple Vitamins-Minerals (MULTIVITAMIN PO) Take 1 tablet by mouth daily.        . nitroGLYCERIN (NITROLINGUAL) 0.4 MG/SPRAY spray Place 1 spray under the tongue as directed.        Marland Kitchen omeprazole (PRILOSEC) 20 MG capsule TAKE ONE CAPSULE BY MOUTH DAILY  90 capsule  2  . polyethylene glycol powder (MIRALAX) powder Take 17 g by mouth as needed.        . Protein (UNJURY UNFLAVORED PO) Take 8 oz by mouth daily.        Marland Kitchen TAZTIA XT 180 MG 24 hr capsule TAKE ONE CAPSULE BY MOUTH DAILY  90 capsule  3  . tiotropium (SPIRIVA) 18 MCG inhalation capsule Place 18 mcg into inhaler and inhale daily.       . traZODone (DESYREL) 100 MG tablet TAKE 1 TABLET BY MOUTH EVERY NIGHT AT BEDTIME  90 tablet  2  . triamcinolone (KENALOG) 0.025 % cream APPLY THREE TIMES DAILY ON AFFECTED AREA AS NEEDED  80 g  1    Allergies  Allergen Reactions  . Enalapril Maleate     REACTION: cough  . Shellfish-Derived Products Swelling    History   Social  History  . Marital Status: Married    Spouse Name: N/A    Number of Children: Y  . Years of Education: N/A  Occupational History  .  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     Social History Main Topics  . Smoking status: Former Smoker -- 2.0 packs/day for 41 years    Types: Cigarettes    Quit date: 12/24/1997  . Smokeless tobacco: Former Neurosurgeon  . Alcohol Use: No  . Drug Use: No  . Sexually Active: Not on file   Other Topics Concern  . Not on file   Social History Narrative  . No narrative on file    Family History  Problem Relation Age of Onset  . Hypertension      family history of htn  . Heart disease Maternal Aunt   . Lung cancer Mother   . Cancer Mother     Lung Cancer  . Heart disease Father     CHF    Review of Systems:  As stated in the HPI and otherwise negative.   BP 120/58  Pulse 60  Ht 5\' 3"  (1.6 m)  Wt 225 lb 12.8 oz (102.422 kg)  BMI 40.00 kg/m2  Physical Examination: General: Well developed, well nourished, NAD HEENT: OP clear, mucus membranes moist SKIN: warm, dry. No rashes. Neuro: No focal deficits Musculoskeletal: Muscle strength 5/5 all ext Psychiatric: Mood and affect normal Neck: No JVD, no carotid bruits, no thyromegaly, no lymphadenopathy. Lungs:Clear bilaterally, no wheezes, rhonci, crackles Cardiovascular: Regular rate and rhythm. No murmurs, gallops or rubs. Abdomen:Soft. Bowel sounds present. Non-tender.  Extremities: Trace lower extremity edema. Pulses are 2 + in the bilateral DP/PT.

## 2012-03-17 NOTE — Assessment & Plan Note (Addendum)
Stable. No changes today. Continue current therapy. BP is controlled and lipids are at goal.

## 2012-03-20 ENCOUNTER — Ambulatory Visit (INDEPENDENT_AMBULATORY_CARE_PROVIDER_SITE_OTHER): Payer: Medicare Other | Admitting: Surgery

## 2012-03-20 ENCOUNTER — Encounter (INDEPENDENT_AMBULATORY_CARE_PROVIDER_SITE_OTHER): Payer: Self-pay | Admitting: Surgery

## 2012-03-20 VITALS — Ht 63.0 in | Wt 222.5 lb

## 2012-03-20 DIAGNOSIS — Z9884 Bariatric surgery status: Secondary | ICD-10-CM | POA: Insufficient documentation

## 2012-03-20 NOTE — Progress Notes (Signed)
[facility]  Ovidio Kin, MD,  FACS [address]  [address] [place], [place]    [address] Phone:  [phone] FAX:  [phone]   Re:   [name]. DOB:   1941-02-18 MRN:   [id number]  ASSESSMENT AND PLAN: 1. Status post placement lap band, APL - placed 05/29/2011  Initial weight 244, BMI - 43.4.  I last saw him 08/30/2011 - he had good weight loss at that time.  He was in the hospital twice in Nov/Dec 2012.  This has stalled him seeing [place].  Today I added 1 cc for a total of 7 cc.  He will see me or Mardelle Matte back in 8 to 10 weeks.   2. Insulin-dependent diabetes mellitus. Better. He is down to using 10 - 10 - 10 units/day.  3. COPD.  He is going to a pulmonary rehab under the direction of Dr. Carolin Sicks. 4. Hypertension.  5. Coronary artery disease with stent placement.   See Dr. Jones Broom. 6. Bilateral lower extremity fractures. Now healed and able to walk.  He is out of the wheel chair and walking. 7. Remote history of pulmonary embolism.  8. Chronic renal insufficiency.  HISTORY OF PRESENT ILLNESS: Chief Complaint  Patient presents with  . Lap Band Fill    [name]. is a 71 y.o. (DOB: 07/30/1941)  white male who is a patient of Romero Belling, MD, MD and comes to me today for lap band follow up.  He still has a lot of medical issues which have interfered with regular follow up.  He feels no limit to his eating.  He has no nausea or vomiting.  He is hungry some.  PHYSICAL EXAM: Ht 5\' 3"  (1.6 m)  Wt 222 lb 8 oz (100.925 kg)  BMI 39.41 kg/m2  General: WN WM who is alert and generally healthy appearing.  HEENT: Normal. Pupils equal. Good dentition. Lungs: Clear to auscultation and symmetric breath sounds. Heart:  RRR. No murmur or rub. Abdomen: Soft. No mass. No tenderness. No hernia. Normal bowel sounds.  Port in RUQ is stable and in good position.  PROCEDURE:  While in the office, I accessed his lap band.  He had 6 cc in the band.  I  added 1 cc for a total of 7 cc.  DATA REVIEWED: None   Ovidio Kin, MD, FACS Office:  [phone]

## 2012-04-01 ENCOUNTER — Encounter (HOSPITAL_COMMUNITY): Payer: Self-pay

## 2012-04-01 ENCOUNTER — Encounter (HOSPITAL_COMMUNITY)
Admission: RE | Admit: 2012-04-01 | Discharge: 2012-04-01 | Disposition: A | Discharge status: Home / Self Care | Payer: Medicare Other | Source: Ambulatory Visit | Attending: Pulmonary Disease | Admitting: Pulmonary Disease

## 2012-04-01 DIAGNOSIS — Z5189 Encounter for other specified aftercare: Secondary | ICD-10-CM | POA: Insufficient documentation

## 2012-04-01 DIAGNOSIS — E119 Type 2 diabetes mellitus without complications: Secondary | ICD-10-CM | POA: Insufficient documentation

## 2012-04-01 DIAGNOSIS — I5032 Chronic diastolic (congestive) heart failure: Secondary | ICD-10-CM | POA: Insufficient documentation

## 2012-04-01 DIAGNOSIS — N183 Chronic kidney disease, stage 3 unspecified: Secondary | ICD-10-CM | POA: Insufficient documentation

## 2012-04-01 DIAGNOSIS — I10 Essential (primary) hypertension: Secondary | ICD-10-CM | POA: Insufficient documentation

## 2012-04-01 DIAGNOSIS — I739 Peripheral vascular disease, unspecified: Secondary | ICD-10-CM | POA: Insufficient documentation

## 2012-04-01 DIAGNOSIS — J449 Chronic obstructive pulmonary disease, unspecified: Secondary | ICD-10-CM | POA: Insufficient documentation

## 2012-04-01 DIAGNOSIS — J4489 Other specified chronic obstructive pulmonary disease: Secondary | ICD-10-CM | POA: Insufficient documentation

## 2012-04-01 DIAGNOSIS — I2510 Atherosclerotic heart disease of native coronary artery without angina pectoris: Secondary | ICD-10-CM | POA: Insufficient documentation

## 2012-04-01 DIAGNOSIS — E785 Hyperlipidemia, unspecified: Secondary | ICD-10-CM | POA: Insufficient documentation

## 2012-04-01 DIAGNOSIS — I129 Hypertensive chronic kidney disease with stage 1 through stage 4 chronic kidney disease, or unspecified chronic kidney disease: Secondary | ICD-10-CM | POA: Insufficient documentation

## 2012-04-01 HISTORY — DX: Cerebral infarction, unspecified: I63.9

## 2012-04-01 NOTE — Progress Notes (Signed)
[name] came in today to Pulmonary Rehab for orientation.  We reviewed medical history, medications and did 6 min walk test.  He did have some hip and lower back pain with walk 4/10.  Oxygen levels did maintain above 90%.  Safety and hand hygiene in the exercise area reviewed with patient.  Patient voices understanding.Demonstration and practice of PLB using pulse oximeter.  Patient able to return demonstration satisfactorily. He plans to start exercise on 04-01-12.

## 2012-04-03 ENCOUNTER — Encounter (HOSPITAL_COMMUNITY)
Admission: RE | Admit: 2012-04-03 | Discharge: 2012-04-03 | Disposition: A | Discharge status: Home / Self Care | Payer: Medicare Other | Source: Ambulatory Visit | Attending: Pulmonary Disease | Admitting: Pulmonary Disease

## 2012-04-03 NOTE — Progress Notes (Signed)
[name] came to [facility] today for first day of exercise.  CBG was 360.  Ketones neg.  He did not exercise.  He will meet with nutritionist to discuss diabetic diet. He states levels have been higher recently since lap band adjustment.

## 2012-04-08 ENCOUNTER — Encounter (HOSPITAL_COMMUNITY): Admission: RE | Admit: 2012-04-08 | Payer: Medicare Other | Source: Ambulatory Visit

## 2012-04-08 DIAGNOSIS — J449 Chronic obstructive pulmonary disease, unspecified: Secondary | ICD-10-CM | POA: Diagnosis not present

## 2012-04-08 DIAGNOSIS — I739 Peripheral vascular disease, unspecified: Secondary | ICD-10-CM | POA: Diagnosis not present

## 2012-04-08 DIAGNOSIS — I129 Hypertensive chronic kidney disease with stage 1 through stage 4 chronic kidney disease, or unspecified chronic kidney disease: Secondary | ICD-10-CM | POA: Diagnosis not present

## 2012-04-08 DIAGNOSIS — I2510 Atherosclerotic heart disease of native coronary artery without angina pectoris: Secondary | ICD-10-CM | POA: Diagnosis not present

## 2012-04-08 DIAGNOSIS — I5032 Chronic diastolic (congestive) heart failure: Secondary | ICD-10-CM | POA: Diagnosis not present

## 2012-04-08 DIAGNOSIS — I10 Essential (primary) hypertension: Secondary | ICD-10-CM | POA: Diagnosis not present

## 2012-04-08 DIAGNOSIS — E119 Type 2 diabetes mellitus without complications: Secondary | ICD-10-CM | POA: Diagnosis not present

## 2012-04-08 DIAGNOSIS — Z5189 Encounter for other specified aftercare: Secondary | ICD-10-CM | POA: Diagnosis not present

## 2012-04-08 DIAGNOSIS — E785 Hyperlipidemia, unspecified: Secondary | ICD-10-CM | POA: Diagnosis not present

## 2012-04-09 ENCOUNTER — Other Ambulatory Visit: Payer: Self-pay | Admitting: Endocrinology

## 2012-04-10 ENCOUNTER — Encounter (HOSPITAL_COMMUNITY)
Admission: RE | Admit: 2012-04-10 | Discharge: 2012-04-10 | Disposition: A | Discharge status: Home / Self Care | Payer: Medicare Other | Source: Ambulatory Visit | Attending: Pulmonary Disease | Admitting: Pulmonary Disease

## 2012-04-10 DIAGNOSIS — I5032 Chronic diastolic (congestive) heart failure: Secondary | ICD-10-CM | POA: Diagnosis not present

## 2012-04-10 DIAGNOSIS — Z5189 Encounter for other specified aftercare: Secondary | ICD-10-CM | POA: Diagnosis not present

## 2012-04-10 DIAGNOSIS — I129 Hypertensive chronic kidney disease with stage 1 through stage 4 chronic kidney disease, or unspecified chronic kidney disease: Secondary | ICD-10-CM | POA: Diagnosis not present

## 2012-04-10 DIAGNOSIS — E119 Type 2 diabetes mellitus without complications: Secondary | ICD-10-CM | POA: Diagnosis not present

## 2012-04-10 DIAGNOSIS — J449 Chronic obstructive pulmonary disease, unspecified: Secondary | ICD-10-CM | POA: Diagnosis not present

## 2012-04-10 NOTE — Telephone Encounter (Signed)
Rx faxed to [facility]. 

## 2012-04-15 ENCOUNTER — Encounter (HOSPITAL_COMMUNITY)
Admission: RE | Admit: 2012-04-15 | Discharge: 2012-04-15 | Disposition: A | Discharge status: Home / Self Care | Payer: Medicare Other | Source: Ambulatory Visit | Attending: Pulmonary Disease | Admitting: Pulmonary Disease

## 2012-04-15 NOTE — Progress Notes (Signed)
[name] did not exercise today, talked with nutritionist during class.

## 2012-04-17 ENCOUNTER — Encounter (HOSPITAL_COMMUNITY)
Admission: RE | Admit: 2012-04-17 | Discharge: 2012-04-17 | Disposition: A | Discharge status: Home / Self Care | Payer: Medicare Other | Source: Ambulatory Visit | Attending: Pulmonary Disease | Admitting: Pulmonary Disease

## 2012-04-17 DIAGNOSIS — Z5189 Encounter for other specified aftercare: Secondary | ICD-10-CM | POA: Diagnosis not present

## 2012-04-17 DIAGNOSIS — E119 Type 2 diabetes mellitus without complications: Secondary | ICD-10-CM | POA: Diagnosis not present

## 2012-04-17 DIAGNOSIS — J449 Chronic obstructive pulmonary disease, unspecified: Secondary | ICD-10-CM | POA: Diagnosis not present

## 2012-04-17 DIAGNOSIS — I5032 Chronic diastolic (congestive) heart failure: Secondary | ICD-10-CM | POA: Diagnosis not present

## 2012-04-17 DIAGNOSIS — I129 Hypertensive chronic kidney disease with stage 1 through stage 4 chronic kidney disease, or unspecified chronic kidney disease: Secondary | ICD-10-CM | POA: Diagnosis not present

## 2012-04-22 ENCOUNTER — Encounter (HOSPITAL_COMMUNITY)
Admission: RE | Admit: 2012-04-22 | Discharge: 2012-04-22 | Disposition: A | Discharge status: Home / Self Care | Payer: Medicare Other | Source: Ambulatory Visit | Attending: Pulmonary Disease | Admitting: Pulmonary Disease

## 2012-04-22 ENCOUNTER — Ambulatory Visit: Payer: Medicare Other | Admitting: Pulmonary Disease

## 2012-04-22 DIAGNOSIS — I5032 Chronic diastolic (congestive) heart failure: Secondary | ICD-10-CM | POA: Diagnosis not present

## 2012-04-22 DIAGNOSIS — Z5189 Encounter for other specified aftercare: Secondary | ICD-10-CM | POA: Diagnosis not present

## 2012-04-22 DIAGNOSIS — I129 Hypertensive chronic kidney disease with stage 1 through stage 4 chronic kidney disease, or unspecified chronic kidney disease: Secondary | ICD-10-CM | POA: Diagnosis not present

## 2012-04-22 DIAGNOSIS — E119 Type 2 diabetes mellitus without complications: Secondary | ICD-10-CM | POA: Diagnosis not present

## 2012-04-22 DIAGNOSIS — J449 Chronic obstructive pulmonary disease, unspecified: Secondary | ICD-10-CM | POA: Diagnosis not present

## 2012-04-24 ENCOUNTER — Encounter (HOSPITAL_COMMUNITY)
Admission: RE | Admit: 2012-04-24 | Discharge: 2012-04-24 | Disposition: A | Discharge status: Home / Self Care | Payer: Medicare Other | Source: Ambulatory Visit | Attending: Pulmonary Disease | Admitting: Pulmonary Disease

## 2012-04-24 DIAGNOSIS — E785 Hyperlipidemia, unspecified: Secondary | ICD-10-CM | POA: Insufficient documentation

## 2012-04-24 DIAGNOSIS — I10 Essential (primary) hypertension: Secondary | ICD-10-CM | POA: Diagnosis not present

## 2012-04-24 DIAGNOSIS — J4489 Other specified chronic obstructive pulmonary disease: Secondary | ICD-10-CM | POA: Insufficient documentation

## 2012-04-24 DIAGNOSIS — I2510 Atherosclerotic heart disease of native coronary artery without angina pectoris: Secondary | ICD-10-CM | POA: Insufficient documentation

## 2012-04-24 DIAGNOSIS — I5032 Chronic diastolic (congestive) heart failure: Secondary | ICD-10-CM | POA: Diagnosis not present

## 2012-04-24 DIAGNOSIS — I129 Hypertensive chronic kidney disease with stage 1 through stage 4 chronic kidney disease, or unspecified chronic kidney disease: Secondary | ICD-10-CM | POA: Diagnosis not present

## 2012-04-24 DIAGNOSIS — N183 Chronic kidney disease, stage 3 unspecified: Secondary | ICD-10-CM | POA: Diagnosis not present

## 2012-04-24 DIAGNOSIS — J449 Chronic obstructive pulmonary disease, unspecified: Secondary | ICD-10-CM | POA: Diagnosis not present

## 2012-04-24 DIAGNOSIS — Z5189 Encounter for other specified aftercare: Secondary | ICD-10-CM | POA: Insufficient documentation

## 2012-04-24 DIAGNOSIS — I739 Peripheral vascular disease, unspecified: Secondary | ICD-10-CM | POA: Diagnosis not present

## 2012-04-24 DIAGNOSIS — E119 Type 2 diabetes mellitus without complications: Secondary | ICD-10-CM | POA: Insufficient documentation

## 2012-04-25 ENCOUNTER — Encounter (HOSPITAL_COMMUNITY): Payer: Self-pay

## 2012-04-25 ENCOUNTER — Ambulatory Visit (HOSPITAL_COMMUNITY)
Admission: RE | Admit: 2012-04-25 | Discharge: 2012-04-25 | Disposition: A | Discharge status: Home / Self Care | Payer: Medicare Other | Source: Ambulatory Visit | Attending: Internal Medicine | Admitting: Internal Medicine

## 2012-04-25 VITALS — BP 110/50 | HR 66 | Ht 63.0 in | Wt 219.0 lb

## 2012-04-25 DIAGNOSIS — I509 Heart failure, unspecified: Secondary | ICD-10-CM | POA: Diagnosis not present

## 2012-04-25 DIAGNOSIS — I2510 Atherosclerotic heart disease of native coronary artery without angina pectoris: Secondary | ICD-10-CM | POA: Insufficient documentation

## 2012-04-25 DIAGNOSIS — I5032 Chronic diastolic (congestive) heart failure: Secondary | ICD-10-CM | POA: Insufficient documentation

## 2012-04-25 LAB — BASIC METABOLIC PANEL
CO2: 25 mEq/L (ref 19–32)
Chloride: 98 mEq/L (ref 96–112)
Glucose, Bld: 189 mg/dL — ABNORMAL HIGH (ref 70–99)
Potassium: 3.7 mEq/L (ref 3.5–5.1)
Sodium: 136 mEq/L (ref 135–145)

## 2012-04-25 NOTE — Assessment & Plan Note (Addendum)
Doing much better. Volume status improved. Reinforced need for daily weights and reviewed use of sliding scale diuretics.

## 2012-04-25 NOTE — Patient Instructions (Signed)
Follow up in 2 months  Do the following things EVERYDAY: 1) Weigh yourself in the morning before breakfast. Write it down and keep it in a log. 2) Take your medicines as prescribed 3) Eat low salt foods--Limit salt (sodium) to 2000mg per day.  4) Stay as active as you can everyday 

## 2012-04-25 NOTE — Assessment & Plan Note (Addendum)
No evidence of ischemia. Continue current medications.  

## 2012-04-29 ENCOUNTER — Encounter (HOSPITAL_COMMUNITY)
Admission: RE | Admit: 2012-04-29 | Discharge: 2012-04-29 | Disposition: A | Discharge status: Home / Self Care | Payer: Medicare Other | Source: Ambulatory Visit | Attending: Pulmonary Disease | Admitting: Pulmonary Disease

## 2012-05-01 ENCOUNTER — Encounter (HOSPITAL_COMMUNITY)
Admission: RE | Admit: 2012-05-01 | Discharge: 2012-05-01 | Disposition: A | Discharge status: Home / Self Care | Payer: Medicare Other | Source: Ambulatory Visit | Attending: Pulmonary Disease | Admitting: Pulmonary Disease

## 2012-05-01 NOTE — Progress Notes (Signed)
Reviewed home exercise with pt today.  Pt plans to walk at shopping mall and do stretching and resistive band exercises at home.  Reviewed PLB, THR, pulse, RPE, sign and symptoms, NTG use, and when to call 911 or MD.  Pt voiced understanding. Electronically signed by Harriett Sine MS on Thursday May 01 2012 at 1549

## 2012-05-03 NOTE — Progress Notes (Signed)
Patient ID: [name]., male   DOB: [birth year], 71 y.o.   MRN: [id number]  HPI: [name] is a 71 yo WM with h/o CAD, obesity s/p Lap Band June 2012, diastolic CHF (EF45-50%), HTN,CRI (basline about 1.5-1.8 - follows with Dr. Lowell Guitar), hyperlipidemia, PAD, DM referred to HF clinic by Dr. Clifton James for help with CHF management.  He has CAD with multiple prior PCI procedures. Stress myoview in February 2012 with no ischemia. This was done as part of the pre-operative workup before a planned lap band surgery. Recent echo with EF 45-50% + grade 2 diastolic dysfunction. RV normal  He has been followed closely by Dr. Clifton James. Referred to HF clinic recently for help with CHF.  12/28 RA = 8 RV = 45/7/13 PA = 41/14 (27) PCW = 13 Fick cardiac output/index = 4.8/2.5  PVR = 2.9 FA sat = 97% (2L) PA sat = 64%, 64%  01/07/12 PFTs= Severe air flow limitation. + Restrictive lung disease. FEV1 0.91L (39%) FVC 1.38 (43%) FEV1/FVC 74% FEF 25-75% 0.44 (24%) DLCO 52%   He returns for follow up. Breathing better. Denies PND/Orthopnea. Edema improved. Able to walk in to the clinic. Weight at home 213 pounds. He has required additional 80 mg of Lasix twice over the last month. Compliant with medications. Tries to follow low salt diet. He continues with pulmonary rehab. Uses oxygen at night.   ROS: All systems negative except as listed in HPI, PMH and Problem List.  Past Medical History  Diagnosis Date  . Allergic rhinitis   . COPD (chronic obstructive pulmonary disease)     mild to moderate by pfts in 2006  . HLD (hyperlipidemia)   . Impotence   . PVD (peripheral vascular disease)   . Type II or unspecified type diabetes mellitus with renal manifestations, not stated as uncontrolled   . Renal insufficiency   . Osteoarthritis   . Cough     due to Zestril  . Encounter for long-term (current) use of aspirin   . Encounter for long-term (current) use of antiplatelets/antithrombotics   . Essential hypertension, benign    . Chronic diastolic heart failure   . Coronary atherosclerosis of native coronary artery   . Shortness of breath   . Other emphysema   . Edema   . Special screening for malignant neoplasm of prostate   . Conjunctivitis unspecified   . Hypopotassemia   . Osteoporosis, unspecified   . Secondary hyperparathyroidism (of renal origin)   . Gout, unspecified   . Pneumonia, organism unspecified   . 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 Hemiplegia affecting unspecified side, late effect of cerebrovascular disease   . Headache   . Other and unspecified hyperlipidemia   . Nephropathy, diabetic   . Cancer 04/01/12    skin x3  . CHF (congestive heart failure) previous hx  . Stroke 04-01-12    2007, mild   left arm weakness    Current Outpatient Prescriptions  Medication Sig Dispense Refill  . albuterol (VENTOLIN HFA) 108 (90 BASE) MCG/ACT inhaler Inhale 2 puffs into the lungs every 6 (six) hours as needed.        Marland Kitchen allopurinol (ZYLOPRIM) 300 MG tablet TAKE 1 TABLET BY MOUTH DAILY  30 tablet  11  . aspirin 81 MG tablet Take 81 mg by mouth daily.        . Azelastine HCl (ASTEPRO) 0.15 % SOLN 2 sprays each nostril each am  as needed  30 mL  2  . bisoprolol (ZEBETA) 5 MG tablet TAKE 1 TABLET BY MOUTH DAILY  90 tablet  2  . budesonide-formoterol (SYMBICORT) 160-4.5 MCG/ACT inhaler Inhale 2 puffs into the lungs 2 (two) times daily.        . calcitRIOL (ROCALTROL) 0.25 MCG capsule TAKE 1 CAPSULE BY MOUTH DAILY  90 capsule  3  . Calcium-Magnesium-Vitamin D 185-50-100 MG-MG-UNIT CAPS Take 1 capsule by mouth daily.        . citalopram (CELEXA) 20 MG tablet Take 1 tablet (20 mg total) by mouth daily.  30 tablet  3  . CRESTOR 10 MG tablet TAKE 1 TABLET BY MOUTH ONCE DAILY  30 tablet  11  . ferrous sulfate 325 (65 FE) MG tablet Take 325 mg by mouth daily with breakfast.       . finasteride (PROSCAR) 5 MG tablet Take 1 tablet (5 mg total) by  mouth daily.  30 tablet  11  . FOLBIC 2.5-25-2 MG TABS TAKE 1 TABLET BY MOUTH EVERY DAY  90 tablet  2  . furosemide (LASIX) 40 MG tablet Take 2 tablets (80 mg total) by mouth 2 (two) times daily. Take 80 mg by mouth every morning and 40 mg every evening  60 tablet  6  . glucose blood test strip Four times a day, variable glucoses dx 250.03      . HYDROcodone-acetaminophen (VICODIN) 5-500 MG per tablet TAKE 1-2 TABLETS BY MOUTH EVERY 4 HOURS AS NEEDED  100 tablet  0  . insulin aspart (NOVOLOG) 100 UNIT/ML injection Inject into the skin 3 (three) times daily before meals. 3x a day (just before each meal) 10-02-09 units      . insulin NPH (HUMULIN N) 100 UNIT/ML injection Inject 10 Units into the skin at bedtime.       . INSULIN SYRINGE 1CC/29G (B-D INSULIN SYRINGE) 29G X 1/2" 1 ML MISC 4 (four) times daily. Dx 250.03      . isosorbide mononitrate (IMDUR) 60 MG 24 hr tablet Take 1 tablet (60 mg total) by mouth daily.  30 tablet  11  . Multiple Vitamins-Minerals (MULTIVITAMIN PO) Take 1 tablet by mouth daily.        . nitroGLYCERIN (NITROLINGUAL) 0.4 MG/SPRAY spray Place 1 spray under the tongue as directed.        Marland Kitchen omeprazole (PRILOSEC) 20 MG capsule TAKE ONE CAPSULE BY MOUTH DAILY  90 capsule  2  . polyethylene glycol powder (MIRALAX) powder Take 17 g by mouth as needed.        . Protein (UNJURY UNFLAVORED PO) Take 8 oz by mouth daily.        Marland Kitchen TAZTIA XT 180 MG 24 hr capsule TAKE ONE CAPSULE BY MOUTH DAILY  90 capsule  3  . tiotropium (SPIRIVA) 18 MCG inhalation capsule Place 18 mcg into inhaler and inhale daily.       . traZODone (DESYREL) 100 MG tablet TAKE 1 TABLET BY MOUTH EVERY NIGHT AT BEDTIME  90 tablet  2  . triamcinolone (KENALOG) 0.025 % cream APPLY THREE TIMES DAILY ON AFFECTED AREA AS NEEDED  80 g  1     PHYSICAL EXAM: Filed Vitals:   04/25/12 1106  BP: 110/50  Pulse: 66   Weight change:  219 (222 pounds) General:  Well appearing. No resp difficulty HEENT: normal Neck:  supple. JVP flat. Carotids 2+ bilaterally; no bruits. No lymphadenopathy or thryomegaly appreciated. Cor: PMI normal. Regular rate & rhythm. No  rubs, gallops or murmurs. Lungs: clear Abdomen: obese soft, nontender, nondistended. No hepatosplenomegaly. No bruits or masses. Good bowel sounds. Extremities: no cyanosis, clubbing, rash, edema Neuro: alert & orientedx3, cranial nerves grossly intact. Moves all 4 extremities w/o difficulty. Affect pleasant.    ASSESSMENT & PLAN:

## 2012-05-05 ENCOUNTER — Ambulatory Visit (INDEPENDENT_AMBULATORY_CARE_PROVIDER_SITE_OTHER): Payer: Medicare Other | Admitting: Endocrinology

## 2012-05-05 ENCOUNTER — Encounter: Payer: Self-pay | Admitting: Endocrinology

## 2012-05-05 VITALS — BP 132/68 | HR 66 | Temp 97.5°F | Ht 63.0 in | Wt 222.0 lb

## 2012-05-05 DIAGNOSIS — N058 Unspecified nephritic syndrome with other morphologic changes: Secondary | ICD-10-CM

## 2012-05-05 DIAGNOSIS — E1029 Type 1 diabetes mellitus with other diabetic kidney complication: Secondary | ICD-10-CM | POA: Diagnosis not present

## 2012-05-05 NOTE — Progress Notes (Signed)
Subjective:    Patient ID: [name]., male    DOB: [birth year], 71 y.o.   MRN: [id number]  HPI Pt returns for f/u of insulin-requiring DM (dx'ed 1991, complicated by renal insufficiency and CAD and PAD).  Weight is unchanged since last gastric band "fill."  no cbg record, but states cbg's are well-controlled.  He says cbg is highest at hs, and lowest in am. Past Medical History  Diagnosis Date  . Allergic rhinitis   . COPD (chronic obstructive pulmonary disease)     mild to moderate by pfts in 2006  . HLD (hyperlipidemia)   . Impotence   . PVD (peripheral vascular disease)   . Type II or unspecified type diabetes mellitus with renal manifestations, not stated as uncontrolled   . Renal insufficiency   . Osteoarthritis   . Cough     due to Zestril  . Encounter for long-term (current) use of aspirin   . Encounter for long-term (current) use of antiplatelets/antithrombotics   . Essential hypertension, benign   . Chronic diastolic heart failure   . Coronary atherosclerosis of native coronary artery   . Shortness of breath   . Other emphysema   . Edema   . Special screening for malignant neoplasm of prostate   . Conjunctivitis unspecified   . Hypopotassemia   . Osteoporosis, unspecified   . Secondary hyperparathyroidism (of renal origin)   . Gout, unspecified   . Pneumonia, organism unspecified   . 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 Hemiplegia affecting unspecified side, late effect of cerebrovascular disease   . Headache   . Other and unspecified hyperlipidemia   . Nephropathy, diabetic   . Cancer 04/01/12    skin x3  . CHF (congestive heart failure) previous hx  . Stroke 04-01-12    2007, mild   left arm weakness    Past Surgical History  Procedure Date  . Nasal sinus surgery   . Esophagogastroduodenoscopy   . Cardiac catheterization 01/18/2005  . Abdominal surgery   . Laparoscopic gastric banding  05/29/2011    History   Social History  . Marital Status: Married    Spouse Name: N/A    Number of Children: Y  . Years of Education: N/A   Occupational History  .  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     Social History Main Topics  . Smoking status: Former Smoker -- 2.0 packs/day for 41 years    Types: Cigarettes    Quit date: 12/24/1997  . Smokeless tobacco: Former Neurosurgeon  . Alcohol Use: No  . Drug Use: No  . Sexually Active: Not on file   Other Topics Concern  . Not on file   Social History Narrative  . No narrative on file    Current Outpatient Prescriptions on File Prior to Visit  Medication Sig Dispense Refill  . albuterol (VENTOLIN HFA) 108 (90 BASE) MCG/ACT inhaler Inhale 2 puffs into the lungs every 6 (six) hours as needed.        Marland Kitchen allopurinol (ZYLOPRIM) 300 MG tablet TAKE 1 TABLET BY MOUTH DAILY  30 tablet  11  . aspirin 81 MG tablet Take 81 mg by mouth daily.        . Azelastine HCl (ASTEPRO) 0.15 % SOLN 2 sprays each nostril each am as needed  30 mL  2  . bisoprolol (ZEBETA) 5 MG tablet TAKE 1 TABLET BY MOUTH DAILY  90  tablet  2  . budesonide-formoterol (SYMBICORT) 160-4.5 MCG/ACT inhaler Inhale 2 puffs into the lungs 2 (two) times daily.        . calcitRIOL (ROCALTROL) 0.25 MCG capsule TAKE 1 CAPSULE BY MOUTH DAILY  90 capsule  3  . Calcium-Magnesium-Vitamin D 185-50-100 MG-MG-UNIT CAPS Take 1 capsule by mouth daily.        . citalopram (CELEXA) 20 MG tablet Take 1 tablet (20 mg total) by mouth daily.  30 tablet  3  . CRESTOR 10 MG tablet TAKE 1 TABLET BY MOUTH ONCE DAILY  30 tablet  11  . ferrous sulfate 325 (65 FE) MG tablet Take 325 mg by mouth daily with breakfast.       . finasteride (PROSCAR) 5 MG tablet Take 1 tablet (5 mg total) by mouth daily.  30 tablet  11  . FOLBIC 2.5-25-2 MG TABS TAKE 1 TABLET BY MOUTH EVERY DAY  90 tablet  2  . furosemide (LASIX) 40 MG tablet Take 2 tablets (80 mg total) by mouth 2 (two) times daily. Take 80 mg by mouth every morning and 40 mg  every evening  60 tablet  6  . glucose blood test strip Four times a day, variable glucoses dx 250.03      . HYDROcodone-acetaminophen (VICODIN) 5-500 MG per tablet TAKE 1-2 TABLETS BY MOUTH EVERY 4 HOURS AS NEEDED  100 tablet  0  . insulin aspart (NOVOLOG) 100 UNIT/ML injection Inject into the skin 3 (three) times daily before meals. 3x a day (just before each meal) 10-02-14 units      . INSULIN SYRINGE 1CC/29G (B-D INSULIN SYRINGE) 29G X 1/2" 1 ML MISC 4 (four) times daily. Dx 250.03      . isosorbide mononitrate (IMDUR) 60 MG 24 hr tablet Take 1 tablet (60 mg total) by mouth daily.  30 tablet  11  . Multiple Vitamins-Minerals (MULTIVITAMIN PO) Take 1 tablet by mouth daily.        . nitroGLYCERIN (NITROLINGUAL) 0.4 MG/SPRAY spray Place 1 spray under the tongue as directed.        Marland Kitchen omeprazole (PRILOSEC) 20 MG capsule TAKE ONE CAPSULE BY MOUTH DAILY  90 capsule  2  . polyethylene glycol powder (MIRALAX) powder Take 17 g by mouth as needed.        . Protein (UNJURY UNFLAVORED PO) Take 8 oz by mouth daily.        Marland Kitchen TAZTIA XT 180 MG 24 hr capsule TAKE ONE CAPSULE BY MOUTH DAILY  90 capsule  3  . tiotropium (SPIRIVA) 18 MCG inhalation capsule Place 18 mcg into inhaler and inhale daily.       . traZODone (DESYREL) 100 MG tablet TAKE 1 TABLET BY MOUTH EVERY NIGHT AT BEDTIME  90 tablet  2  . triamcinolone (KENALOG) 0.025 % cream APPLY THREE TIMES DAILY ON AFFECTED AREA AS NEEDED  80 g  1    Allergies  Allergen Reactions  . Enalapril Maleate     REACTION: cough  . Shellfish-Derived Products Swelling    Family History  Problem Relation Age of Onset  . Hypertension      family history of htn  . Heart disease Maternal Aunt   . Lung cancer Mother   . Cancer Mother     Lung Cancer  . Heart disease Father     CHF    BP 132/68  Pulse 66  Temp(Src) 97.5 F (36.4 C) (Oral)  Ht 5\' 3"  (1.6 m)  Wt 222  lb (100.699 kg)  BMI 39.33 kg/m2  SpO2 94%  Review of Systems denies hypoglycemia      Objective:   Physical Exam VITAL SIGNS:  See vs page GENERAL: no distress SKIN:  Insulin injection sites at the anterior abdomen are normal     Assessment & Plan:  DM, well-controlled

## 2012-05-05 NOTE — Patient Instructions (Addendum)
check your blood sugar 2 times a day.  vary the time of day when you check, between before the 3 meals, and at bedtime.  also check if you have symptoms of your blood sugar being too high or too low.  please keep a record of the readings and bring it to your next appointment here.  please call us sooner if you are having low blood sugar episodes, or if it stays over 200.   Increase humalog to 3x a day (just before each meal) 10-02-14 units Stop hph insulin. If he adjusts the gastric band, please reduce each insulin injection by 5 units.  Call if you have more questions about your blood sugar, as your insulin needs may decrease more.   Please come back for a follow-up appointment in 3 months.

## 2012-05-06 ENCOUNTER — Encounter (HOSPITAL_COMMUNITY)
Admission: RE | Admit: 2012-05-06 | Discharge: 2012-05-06 | Disposition: A | Discharge status: Home / Self Care | Payer: Medicare Other | Source: Ambulatory Visit | Attending: Pulmonary Disease | Admitting: Pulmonary Disease

## 2012-05-08 ENCOUNTER — Encounter (HOSPITAL_COMMUNITY)
Admission: RE | Admit: 2012-05-08 | Discharge: 2012-05-08 | Disposition: A | Discharge status: Home / Self Care | Payer: Medicare Other | Source: Ambulatory Visit | Attending: Pulmonary Disease | Admitting: Pulmonary Disease

## 2012-05-09 ENCOUNTER — Encounter: Payer: Self-pay | Admitting: Pulmonary Disease

## 2012-05-09 ENCOUNTER — Ambulatory Visit (INDEPENDENT_AMBULATORY_CARE_PROVIDER_SITE_OTHER): Payer: Medicare Other | Admitting: Pulmonary Disease

## 2012-05-09 VITALS — BP 114/62 | HR 63 | Temp 98.1°F | Ht 63.0 in | Wt 223.2 lb

## 2012-05-09 DIAGNOSIS — J449 Chronic obstructive pulmonary disease, unspecified: Secondary | ICD-10-CM

## 2012-05-09 NOTE — Patient Instructions (Signed)
No change in medications Continue working on weight loss, continue in pulmonary rehab. Watch fluid balance. followup with me 4-54mos

## 2012-05-09 NOTE — Progress Notes (Signed)
  Subjective:    Patient ID: [name]., male    DOB: 06/21/1941, 71 y.o.   MRN: [id number]  HPI Patient comes in today for followup of his known COPD.  He is staying on his bronchodilator regimen, and participating in pulmonary rehabilitation.  He feels that his breathing has improved since being in his exercise class, and denies any recent acute exacerbation.  He does have a history of diastolic heart failure with chronic lower extremity edema, and I have asked him to watch this very carefully since it can also contribute to his shortness of breath.   Review of Systems  Constitutional: Negative.  Negative for fever and unexpected weight change.  HENT: Positive for congestion and rhinorrhea. Negative for ear pain, nosebleeds, sore throat, sneezing, trouble swallowing, dental problem, postnasal drip and sinus pressure.   Eyes: Negative.  Negative for redness and itching.  Respiratory: Positive for shortness of breath. Negative for cough, chest tightness and wheezing.   Cardiovascular: Positive for leg swelling. Negative for palpitations.  Gastrointestinal: Negative.  Negative for nausea and vomiting.  Genitourinary: Negative.  Negative for dysuria.  Musculoskeletal: Negative.  Negative for joint swelling.  Skin: Negative.  Negative for rash.  Neurological: Negative.  Negative for headaches.  Hematological: Negative.  Does not bruise/bleed easily.  Psychiatric/Behavioral: Negative.  Negative for dysphoric mood. The patient is not nervous/anxious.        Objective:   Physical Exam Obese male in no acute distress Nose without purulence or discharge noted Chest with clear breath sounds, no wheezing Cardiac exam with regular rate and rhythm Lower extremities with 1+ edema bilaterally, no cyanosis Alert and oriented, moves all 4 extremities.       Assessment & Plan:

## 2012-05-09 NOTE — Assessment & Plan Note (Signed)
The patient is currently at a reasonable baseline from a breathing standpoint.  He is on a good bronchodilator regimen, and is participating in pulmonary rehabilitation.  I have asked him to continue on his current medication, and to watch his fluid balance very carefully.

## 2012-05-13 ENCOUNTER — Encounter (HOSPITAL_COMMUNITY)
Admission: RE | Admit: 2012-05-13 | Discharge: 2012-05-13 | Disposition: A | Discharge status: Home / Self Care | Payer: Medicare Other | Source: Ambulatory Visit | Attending: Pulmonary Disease | Admitting: Pulmonary Disease

## 2012-05-13 ENCOUNTER — Other Ambulatory Visit: Payer: Self-pay | Admitting: Endocrinology

## 2012-05-13 NOTE — Telephone Encounter (Signed)
Last written 04/09/2012 #100 with 0 refills.

## 2012-05-13 NOTE — Telephone Encounter (Signed)
Rx faxed to [facility]. 

## 2012-05-15 ENCOUNTER — Encounter (INDEPENDENT_AMBULATORY_CARE_PROVIDER_SITE_OTHER): Payer: Self-pay

## 2012-05-15 ENCOUNTER — Encounter (HOSPITAL_COMMUNITY): Payer: Medicare Other

## 2012-05-15 ENCOUNTER — Ambulatory Visit (INDEPENDENT_AMBULATORY_CARE_PROVIDER_SITE_OTHER): Payer: Medicare Other | Admitting: Physician Assistant

## 2012-05-15 VITALS — BP 126/62 | HR 64 | Temp 98.1°F | Resp 12 | Ht 63.0 in | Wt 221.2 lb

## 2012-05-15 DIAGNOSIS — Z4651 Encounter for fitting and adjustment of gastric lap band: Secondary | ICD-10-CM | POA: Diagnosis not present

## 2012-05-15 NOTE — Patient Instructions (Signed)
Take clear liquids tonight. Thin protein shakes are ok to start tomorrow morning. Slowly advance your diet thereafter. Call us if you have persistent vomiting or regurgitation, night cough or reflux symptoms. Return as scheduled or sooner if you notice no changes in hunger/portion sizes.  

## 2012-05-15 NOTE — Progress Notes (Signed)
  HISTORY: [name]. is a 71 y.o.male who received an AP-Large lap-band in June 2012 by Dr. Ezzard Standing. He says he's not terribly hungry for the most part and believes his portion sizes are small but his weight loss has stagnated. He suspects some of his weight variations occur due to fluid retention, for which he takes Lasix. He denies vomiting or reflux symptoms. He'd like an adjustment today as he feels the band isn't restrictive enough.  VITAL SIGNS: Filed Vitals:   05/15/12 1145  BP: 126/62  Pulse: 64  Temp: 98.1 F (36.7 C)  Resp: 12    PHYSICAL EXAM: Physical exam reveals a very well-appearing 71 y.o.male in no apparent distress Neurologic: Awake, alert, oriented Psych: Bright affect, conversant Respiratory: Breathing even and unlabored. No stridor or wheezing Abdomen: Soft, nontender, nondistended to palpation. Incisions well-healed. No incisional hernias. Port easily palpated. Extremities: Atraumatic, good range of motion.  ASSESMENT: 71 y.o.  male  s/p AP-Large lap-band.   PLAN: The patient's port was accessed with a 20G Huber needle without difficulty. Clear fluid was aspirated and 1 mL saline was added to the port to give a total predicted volume of 8 mL. The patient was able to swallow water without difficulty following the procedure and was instructed to take clear liquids for the next 24-48 hours and advance slowly as tolerated.

## 2012-05-20 ENCOUNTER — Encounter (HOSPITAL_COMMUNITY)
Admission: RE | Admit: 2012-05-20 | Discharge: 2012-05-20 | Disposition: A | Discharge status: Home / Self Care | Payer: Medicare Other | Source: Ambulatory Visit | Attending: Pulmonary Disease | Admitting: Pulmonary Disease

## 2012-05-22 ENCOUNTER — Encounter (HOSPITAL_COMMUNITY)
Admission: RE | Admit: 2012-05-22 | Discharge: 2012-05-22 | Disposition: A | Discharge status: Home / Self Care | Payer: Medicare Other | Source: Ambulatory Visit | Attending: Pulmonary Disease | Admitting: Pulmonary Disease

## 2012-05-27 ENCOUNTER — Encounter (HOSPITAL_COMMUNITY)
Admission: RE | Admit: 2012-05-27 | Discharge: 2012-05-27 | Disposition: A | Discharge status: Home / Self Care | Payer: Medicare Other | Source: Ambulatory Visit | Attending: Pulmonary Disease | Admitting: Pulmonary Disease

## 2012-05-27 DIAGNOSIS — I5032 Chronic diastolic (congestive) heart failure: Secondary | ICD-10-CM | POA: Insufficient documentation

## 2012-05-27 DIAGNOSIS — Z5189 Encounter for other specified aftercare: Secondary | ICD-10-CM | POA: Insufficient documentation

## 2012-05-27 DIAGNOSIS — I739 Peripheral vascular disease, unspecified: Secondary | ICD-10-CM | POA: Insufficient documentation

## 2012-05-27 DIAGNOSIS — I10 Essential (primary) hypertension: Secondary | ICD-10-CM | POA: Insufficient documentation

## 2012-05-27 DIAGNOSIS — E119 Type 2 diabetes mellitus without complications: Secondary | ICD-10-CM | POA: Insufficient documentation

## 2012-05-27 DIAGNOSIS — N183 Chronic kidney disease, stage 3 unspecified: Secondary | ICD-10-CM | POA: Diagnosis not present

## 2012-05-27 DIAGNOSIS — I2510 Atherosclerotic heart disease of native coronary artery without angina pectoris: Secondary | ICD-10-CM | POA: Diagnosis not present

## 2012-05-27 DIAGNOSIS — E785 Hyperlipidemia, unspecified: Secondary | ICD-10-CM | POA: Diagnosis not present

## 2012-05-27 DIAGNOSIS — J449 Chronic obstructive pulmonary disease, unspecified: Secondary | ICD-10-CM | POA: Insufficient documentation

## 2012-05-27 DIAGNOSIS — I129 Hypertensive chronic kidney disease with stage 1 through stage 4 chronic kidney disease, or unspecified chronic kidney disease: Secondary | ICD-10-CM | POA: Insufficient documentation

## 2012-05-27 DIAGNOSIS — J4489 Other specified chronic obstructive pulmonary disease: Secondary | ICD-10-CM | POA: Insufficient documentation

## 2012-05-28 ENCOUNTER — Other Ambulatory Visit (HOSPITAL_COMMUNITY): Payer: Self-pay | Admitting: *Deleted

## 2012-05-28 ENCOUNTER — Other Ambulatory Visit (HOSPITAL_COMMUNITY): Payer: Self-pay | Admitting: Internal Medicine

## 2012-05-28 MED ORDER — CITALOPRAM HYDROBROMIDE 20 MG PO TABS: 20.0000 mg | ORAL_TABLET | Freq: Every day | ORAL | Status: DC

## 2012-05-29 ENCOUNTER — Encounter (HOSPITAL_COMMUNITY)
Admission: RE | Admit: 2012-05-29 | Discharge: 2012-05-29 | Disposition: A | Discharge status: Home / Self Care | Payer: Medicare Other | Source: Ambulatory Visit | Attending: Pulmonary Disease | Admitting: Pulmonary Disease

## 2012-06-03 ENCOUNTER — Encounter (HOSPITAL_COMMUNITY)
Admission: RE | Admit: 2012-06-03 | Discharge: 2012-06-03 | Disposition: A | Discharge status: Home / Self Care | Payer: Medicare Other | Source: Ambulatory Visit | Attending: Pulmonary Disease | Admitting: Pulmonary Disease

## 2012-06-05 ENCOUNTER — Encounter (HOSPITAL_COMMUNITY)
Admission: RE | Admit: 2012-06-05 | Discharge: 2012-06-05 | Disposition: A | Discharge status: Home / Self Care | Payer: Medicare Other | Source: Ambulatory Visit | Attending: Pulmonary Disease | Admitting: Pulmonary Disease

## 2012-06-10 ENCOUNTER — Encounter (HOSPITAL_COMMUNITY)
Admission: RE | Admit: 2012-06-10 | Discharge: 2012-06-10 | Disposition: A | Discharge status: Home / Self Care | Payer: Medicare Other | Source: Ambulatory Visit | Attending: Pulmonary Disease | Admitting: Pulmonary Disease

## 2012-06-12 ENCOUNTER — Encounter (HOSPITAL_COMMUNITY)
Admission: RE | Admit: 2012-06-12 | Discharge: 2012-06-12 | Disposition: A | Discharge status: Home / Self Care | Payer: Medicare Other | Source: Ambulatory Visit | Attending: Pulmonary Disease | Admitting: Pulmonary Disease

## 2012-06-17 ENCOUNTER — Encounter (HOSPITAL_COMMUNITY): Payer: Medicare Other

## 2012-06-18 ENCOUNTER — Other Ambulatory Visit: Payer: Self-pay | Admitting: Endocrinology

## 2012-06-18 NOTE — Telephone Encounter (Signed)
Last written 05/13/2012 #100 with 0 refills.

## 2012-06-18 NOTE — Telephone Encounter (Signed)
Rx faxed to [facility]. 

## 2012-06-19 ENCOUNTER — Encounter (HOSPITAL_COMMUNITY)
Admission: RE | Admit: 2012-06-19 | Discharge: 2012-06-19 | Disposition: A | Discharge status: Home / Self Care | Payer: Medicare Other | Source: Ambulatory Visit | Attending: Pulmonary Disease | Admitting: Pulmonary Disease

## 2012-06-22 ENCOUNTER — Other Ambulatory Visit: Payer: Self-pay | Admitting: Endocrinology

## 2012-06-24 ENCOUNTER — Encounter (HOSPITAL_COMMUNITY)
Admission: RE | Admit: 2012-06-24 | Discharge: 2012-06-24 | Disposition: A | Discharge status: Home / Self Care | Payer: Medicare Other | Source: Ambulatory Visit | Attending: Pulmonary Disease | Admitting: Pulmonary Disease

## 2012-06-24 DIAGNOSIS — N183 Chronic kidney disease, stage 3 unspecified: Secondary | ICD-10-CM | POA: Insufficient documentation

## 2012-06-24 DIAGNOSIS — I2510 Atherosclerotic heart disease of native coronary artery without angina pectoris: Secondary | ICD-10-CM | POA: Insufficient documentation

## 2012-06-24 DIAGNOSIS — Z5189 Encounter for other specified aftercare: Secondary | ICD-10-CM | POA: Insufficient documentation

## 2012-06-24 DIAGNOSIS — E785 Hyperlipidemia, unspecified: Secondary | ICD-10-CM | POA: Diagnosis not present

## 2012-06-24 DIAGNOSIS — I129 Hypertensive chronic kidney disease with stage 1 through stage 4 chronic kidney disease, or unspecified chronic kidney disease: Secondary | ICD-10-CM | POA: Insufficient documentation

## 2012-06-24 DIAGNOSIS — J449 Chronic obstructive pulmonary disease, unspecified: Secondary | ICD-10-CM | POA: Diagnosis not present

## 2012-06-24 DIAGNOSIS — J4489 Other specified chronic obstructive pulmonary disease: Secondary | ICD-10-CM | POA: Insufficient documentation

## 2012-06-24 DIAGNOSIS — I739 Peripheral vascular disease, unspecified: Secondary | ICD-10-CM | POA: Insufficient documentation

## 2012-06-24 DIAGNOSIS — I5032 Chronic diastolic (congestive) heart failure: Secondary | ICD-10-CM | POA: Diagnosis not present

## 2012-06-24 DIAGNOSIS — I10 Essential (primary) hypertension: Secondary | ICD-10-CM | POA: Insufficient documentation

## 2012-06-24 DIAGNOSIS — E119 Type 2 diabetes mellitus without complications: Secondary | ICD-10-CM | POA: Diagnosis not present

## 2012-06-26 ENCOUNTER — Encounter (HOSPITAL_COMMUNITY): Payer: Medicare Other

## 2012-07-01 ENCOUNTER — Encounter (HOSPITAL_COMMUNITY)
Admission: RE | Admit: 2012-07-01 | Discharge: 2012-07-01 | Disposition: A | Discharge status: Home / Self Care | Payer: Medicare Other | Source: Ambulatory Visit | Attending: Pulmonary Disease | Admitting: Pulmonary Disease

## 2012-07-03 ENCOUNTER — Encounter (INDEPENDENT_AMBULATORY_CARE_PROVIDER_SITE_OTHER): Payer: Self-pay

## 2012-07-03 ENCOUNTER — Ambulatory Visit (INDEPENDENT_AMBULATORY_CARE_PROVIDER_SITE_OTHER): Payer: Medicare Other | Admitting: Surgery

## 2012-07-03 ENCOUNTER — Encounter (HOSPITAL_COMMUNITY): Payer: Medicare Other

## 2012-07-03 NOTE — Progress Notes (Signed)
  HISTORY: [name]. is a 71 y.o.male who received an AP-Large lap-band in June 2012 by Dr. Ezzard Standing. He has lost five lbs in the last six weeks. He denies persistent regurgitation although he says meats are difficult to get down. He's regurgitated 1-2 times a week. His portion sizes remain small and he doesn't believe they should get much smaller.  VITAL SIGNS: Filed Vitals:   07/03/12 1106  BP: 140/64  Pulse: 68  Temp: 97.2 F (36.2 C)  Resp: 18    PHYSICAL EXAM: Physical exam reveals a very well-appearing 71 y.o.male in no apparent distress Neurologic: Awake, alert, oriented Psych: Bright affect, conversant Respiratory: Breathing even and unlabored. No stridor or wheezing Extremities: Atraumatic, good range of motion. Skin: Warm, Dry, no rashes Musculoskeletal: Normal gait, Joints normal  ASSESMENT: 71 y.o.  male  s/p AP-Large lap-band.   PLAN: We deferred an adjustment today as he's on the upper range of the green zone. Any more fluid would almost certainly put him in the red zone. I offered to remove some fluid to make meats easier to tolerate but he deferred. We'll have him back in one month to re-evaluate.

## 2012-07-03 NOTE — Patient Instructions (Signed)
Return in one month or sooner if needed

## 2012-07-08 ENCOUNTER — Encounter (HOSPITAL_COMMUNITY): Payer: Medicare Other

## 2012-07-10 ENCOUNTER — Encounter (HOSPITAL_COMMUNITY)
Admission: RE | Admit: 2012-07-10 | Discharge: 2012-07-10 | Disposition: A | Discharge status: Home / Self Care | Payer: Medicare Other | Source: Ambulatory Visit | Attending: Pulmonary Disease | Admitting: Pulmonary Disease

## 2012-07-15 ENCOUNTER — Encounter (HOSPITAL_COMMUNITY)
Admission: RE | Admit: 2012-07-15 | Discharge: 2012-07-15 | Disposition: A | Discharge status: Home / Self Care | Payer: Medicare Other | Source: Ambulatory Visit | Attending: Pulmonary Disease | Admitting: Pulmonary Disease

## 2012-07-17 ENCOUNTER — Encounter (HOSPITAL_COMMUNITY)
Admission: RE | Admit: 2012-07-17 | Discharge: 2012-07-17 | Disposition: A | Discharge status: Home / Self Care | Payer: Medicare Other | Source: Ambulatory Visit | Attending: Pulmonary Disease | Admitting: Pulmonary Disease

## 2012-07-18 NOTE — Progress Notes (Signed)
Pulmonary Rehabilitation Program Outcomes Report   Orientation:  04/01/2012 Graduate Date:  07/17/2012 # of sessions completed: 24  Pulmonologist: Clance  Class Time: 1:30pm  A.  Exercise Program:  Tolerates exercise @ 2.3 METS for 45 minutes, Walk Test Results:  Pre: 440ft and Post: 636ft, Improved functional capacity  61.97 %, Improved  muscular strength  30.77 %, Decreased dyspnea score 9.73 %, Decreased education score 25.00 %, Exercise limited by dyspnea, Patient plans to continue pulmonary rehab maintenance program and Discharged to home exercise program.  Anticipated compliance:  good  B.  Mental Health:  Health related anxiety and Quality of Life (QOL)  changes:  Overall  -4.92 %, Health/Functioning 12.73 %, Socioeconomics 7.63 %, Psych/Spiritual -11.99 %, Family -57.14 %    C.  Education/Instruction/Skills  Uses Perceived Exertion Scale and/or Dyspnea Scale and Attended 10 education classes  Demonstrates accurate diaphragmatic breathing and pursed lip breathing. Home Exercise Given 05/01/2012  D.  Nutrition/Weight Control/Body Composition: Pt is making many healthy food choices.  Pt with 2.4 kg wt gain. BMI 38.6, % body fat 34.0%. Pt with 2.7%  increase in % body fat. Pt with good SMBG. Lab Results  Component Value Date   HGBA1C 7.0* 02/05/2012  Section Completed by: Mickle Plumb, M.Ed, RD, LDN, CDE    E.  Blood Lipids  Lab Results  Component Value Date   CHOL 133 10/22/2011   HDL 48 10/22/2011   LDLCALC 66 10/22/2011   TRIG 94 10/22/2011   CHOLHDL 2.8 10/22/2011    F.  Lifestyle Changes:  Making positive lifestyle changes  G.  Symptoms noted with exercise:  Shortness of breath, Dizziness, Nausea and Fatigue  Report Completed By:  Frederik Schmidt. Manson Passey, MS, NASM, CES   Comments: Patient tolerated well with all exercises. Patient stated he was satisfied with the outcome but feels he needs a few more months with rehab and would like to come to  the maintenance program. Patient increased workloads and progressed intensity levels as he could handle. Patient maintained stable vital signs and continued o2 sats above 93% RA. Very compliant to work with. Thanks for the referral.  Courtney L. Manson Passey, MS, NASM, CES  Agree with the above note

## 2012-07-20 ENCOUNTER — Other Ambulatory Visit: Payer: Self-pay | Admitting: Endocrinology

## 2012-07-21 ENCOUNTER — Other Ambulatory Visit: Payer: Self-pay | Admitting: Dermatology

## 2012-07-21 DIAGNOSIS — L820 Inflamed seborrheic keratosis: Secondary | ICD-10-CM | POA: Diagnosis not present

## 2012-07-21 DIAGNOSIS — Z85828 Personal history of other malignant neoplasm of skin: Secondary | ICD-10-CM | POA: Diagnosis not present

## 2012-07-21 DIAGNOSIS — D485 Neoplasm of uncertain behavior of skin: Secondary | ICD-10-CM | POA: Diagnosis not present

## 2012-07-21 DIAGNOSIS — D239 Other benign neoplasm of skin, unspecified: Secondary | ICD-10-CM | POA: Diagnosis not present

## 2012-07-21 DIAGNOSIS — L219 Seborrheic dermatitis, unspecified: Secondary | ICD-10-CM | POA: Diagnosis not present

## 2012-07-21 NOTE — Telephone Encounter (Signed)
Rx faxed to [facility]. 

## 2012-07-21 NOTE — Telephone Encounter (Signed)
Last written 06/18/2012 #100 with 0 refill-please advise.

## 2012-07-22 ENCOUNTER — Encounter (HOSPITAL_COMMUNITY): Payer: Medicare Other

## 2012-07-24 ENCOUNTER — Encounter (HOSPITAL_COMMUNITY)
Admission: RE | Admit: 2012-07-24 | Discharge: 2012-07-24 | Disposition: A | Discharge status: Home / Self Care | Payer: Medicare Other | Source: Ambulatory Visit | Attending: Pulmonary Disease | Admitting: Pulmonary Disease

## 2012-07-24 DIAGNOSIS — I10 Essential (primary) hypertension: Secondary | ICD-10-CM | POA: Insufficient documentation

## 2012-07-24 DIAGNOSIS — I5032 Chronic diastolic (congestive) heart failure: Secondary | ICD-10-CM | POA: Insufficient documentation

## 2012-07-24 DIAGNOSIS — J4489 Other specified chronic obstructive pulmonary disease: Secondary | ICD-10-CM | POA: Insufficient documentation

## 2012-07-24 DIAGNOSIS — N183 Chronic kidney disease, stage 3 unspecified: Secondary | ICD-10-CM | POA: Insufficient documentation

## 2012-07-24 DIAGNOSIS — I739 Peripheral vascular disease, unspecified: Secondary | ICD-10-CM | POA: Insufficient documentation

## 2012-07-24 DIAGNOSIS — Z5189 Encounter for other specified aftercare: Secondary | ICD-10-CM | POA: Insufficient documentation

## 2012-07-24 DIAGNOSIS — E785 Hyperlipidemia, unspecified: Secondary | ICD-10-CM | POA: Insufficient documentation

## 2012-07-24 DIAGNOSIS — I2510 Atherosclerotic heart disease of native coronary artery without angina pectoris: Secondary | ICD-10-CM | POA: Insufficient documentation

## 2012-07-24 DIAGNOSIS — I129 Hypertensive chronic kidney disease with stage 1 through stage 4 chronic kidney disease, or unspecified chronic kidney disease: Secondary | ICD-10-CM | POA: Insufficient documentation

## 2012-07-24 DIAGNOSIS — E119 Type 2 diabetes mellitus without complications: Secondary | ICD-10-CM | POA: Insufficient documentation

## 2012-07-24 DIAGNOSIS — J449 Chronic obstructive pulmonary disease, unspecified: Secondary | ICD-10-CM | POA: Insufficient documentation

## 2012-07-24 NOTE — Progress Notes (Signed)
Pulmonary Rehab Maintenance   [name] attended Pulmonary Rehab Maintenance today for his first session. VSS stable throughout class 02 SATS 93 - 97% on RA. CBG 197 on admit 178 at d/c.

## 2012-07-29 ENCOUNTER — Encounter (HOSPITAL_COMMUNITY)
Admission: RE | Admit: 2012-07-29 | Discharge: 2012-07-29 | Disposition: A | Discharge status: Home / Self Care | Payer: Medicare Other | Source: Ambulatory Visit | Attending: Pulmonary Disease | Admitting: Pulmonary Disease

## 2012-07-31 ENCOUNTER — Encounter (HOSPITAL_COMMUNITY)
Admission: RE | Admit: 2012-07-31 | Discharge: 2012-07-31 | Disposition: A | Discharge status: Home / Self Care | Payer: Medicare Other | Source: Ambulatory Visit | Attending: Pulmonary Disease | Admitting: Pulmonary Disease

## 2012-08-05 ENCOUNTER — Encounter (HOSPITAL_COMMUNITY)
Admission: RE | Admit: 2012-08-05 | Discharge: 2012-08-05 | Disposition: A | Discharge status: Home / Self Care | Payer: Medicare Other | Source: Ambulatory Visit | Attending: Pulmonary Disease | Admitting: Pulmonary Disease

## 2012-08-07 ENCOUNTER — Encounter (HOSPITAL_COMMUNITY)
Admission: RE | Admit: 2012-08-07 | Discharge: 2012-08-07 | Disposition: A | Discharge status: Home / Self Care | Payer: Medicare Other | Source: Ambulatory Visit | Attending: Pulmonary Disease | Admitting: Pulmonary Disease

## 2012-08-07 ENCOUNTER — Encounter (INDEPENDENT_AMBULATORY_CARE_PROVIDER_SITE_OTHER): Payer: Self-pay

## 2012-08-07 ENCOUNTER — Ambulatory Visit (INDEPENDENT_AMBULATORY_CARE_PROVIDER_SITE_OTHER): Payer: Medicare Other | Admitting: Physician Assistant

## 2012-08-07 VITALS — BP 142/68 | HR 76 | Resp 18 | Ht 63.0 in | Wt 219.0 lb

## 2012-08-07 DIAGNOSIS — Z4651 Encounter for fitting and adjustment of gastric lap band: Secondary | ICD-10-CM

## 2012-08-07 NOTE — Patient Instructions (Signed)
Take clear liquids tonight. Thin protein shakes are ok to start tomorrow morning. Slowly advance your diet thereafter. Call us if you have persistent vomiting or regurgitation, night cough or reflux symptoms. Return as scheduled or sooner if you notice no changes in hunger/portion sizes.  

## 2012-08-07 NOTE — Progress Notes (Signed)
  HISTORY: [name] Nest. is a 71 y.o.male who received an AP-Large lap-band in June 2012 by Dr. Ezzard Standing. He comes in with no new complaints. He does have hunger but no persistent regurgitation. He'd like a band fill today.  VITAL SIGNS: Filed Vitals:   08/07/12 1110  BP: 142/68  Pulse: 76  Resp: 18    PHYSICAL EXAM: Physical exam reveals a very well-appearing 71 y.o.male in no apparent distress Neurologic: Awake, alert, oriented Psych: Bright affect, conversant Respiratory: Breathing even and unlabored. No stridor or wheezing Abdomen: Soft, nontender, nondistended to palpation. Incisions well-healed. No incisional hernias. Port easily palpated. Extremities: Atraumatic, good range of motion.  ASSESMENT: 71 y.o.  male  s/p AP-Large lap-band.   PLAN: The patient's port was accessed with a 20G Huber needle without difficulty. Clear fluid was aspirated and 0.5 mL saline was added to the port to give a total predicted volume of 8.5 mL. The patient was able to swallow water without difficulty following the procedure and was instructed to take clear liquids for the next 24-48 hours and advance slowly as tolerated.

## 2012-08-11 ENCOUNTER — Other Ambulatory Visit (INDEPENDENT_AMBULATORY_CARE_PROVIDER_SITE_OTHER): Payer: Medicare Other

## 2012-08-11 ENCOUNTER — Encounter: Payer: Self-pay | Admitting: Endocrinology

## 2012-08-11 ENCOUNTER — Ambulatory Visit (INDEPENDENT_AMBULATORY_CARE_PROVIDER_SITE_OTHER): Payer: Medicare Other | Admitting: Endocrinology

## 2012-08-11 VITALS — BP 128/62 | HR 69 | Temp 97.3°F | Ht 63.0 in | Wt 216.0 lb

## 2012-08-11 DIAGNOSIS — N058 Unspecified nephritic syndrome with other morphologic changes: Secondary | ICD-10-CM

## 2012-08-11 DIAGNOSIS — E1029 Type 1 diabetes mellitus with other diabetic kidney complication: Secondary | ICD-10-CM | POA: Diagnosis not present

## 2012-08-11 NOTE — Patient Instructions (Addendum)
check your blood sugar 2 times a day.  vary the time of day when you check, between before the 3 meals, and at bedtime.  also check if you have symptoms of your blood sugar being too high or too low.  please keep a record of the readings and bring it to your next appointment here.  please call us sooner if you are having low blood sugar episodes, or if it stays over 200.   continue humalog, 3x a day (just before each meal) 10-02-14 units.   Please come back for a "medicare wellness" appointment in 3 months.

## 2012-08-11 NOTE — Progress Notes (Signed)
Subjective:    Patient ID: [name]., male    DOB: 12/10/1941, 71 y.o.   MRN: [id number]  HPI Pt returns for f/u of insulin-requiring DM (dx'ed 1991, complicated by renal insufficiency and CAD and PAD).  He had a gastric band "fill," 4 days ago.  Since then, he says his appetite is less, but cbg is not lower.  no cbg record, but states cbg's are well-controlled.  He says cbg is highest at hs, and lowest in am.   Past Medical History  Diagnosis Date  . Allergic rhinitis   . COPD (chronic obstructive pulmonary disease)     mild to moderate by pfts in 2006  . HLD (hyperlipidemia)   . Impotence   . PVD (peripheral vascular disease)   . Type II or unspecified type diabetes mellitus with renal manifestations, not stated as uncontrolled   . Renal insufficiency   . Osteoarthritis   . Cough     due to Zestril  . Encounter for long-term (current) use of aspirin   . Encounter for long-term (current) use of antiplatelets/antithrombotics   . Essential hypertension, benign   . Chronic diastolic heart failure   . Coronary atherosclerosis of native coronary artery   . Shortness of breath   . Other emphysema   . Edema   . Special screening for malignant neoplasm of prostate   . Conjunctivitis unspecified   . Hypopotassemia   . Osteoporosis, unspecified   . Secondary hyperparathyroidism (of renal origin)   . Gout, unspecified   . Pneumonia, organism unspecified   . 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 Hemiplegia affecting unspecified side, late effect of cerebrovascular disease   . Headache   . Other and unspecified hyperlipidemia   . Nephropathy, diabetic   . Cancer 04/01/12    skin x3  . CHF (congestive heart failure) previous hx  . Stroke 04-01-12    2007, mild   left arm weakness    Past Surgical History  Procedure Date  . Nasal sinus surgery   . Esophagogastroduodenoscopy   . Cardiac catheterization 01/18/2005   . Abdominal surgery   . Laparoscopic gastric banding 05/29/2011    History   Social History  . Marital Status: Married    Spouse Name: N/A    Number of Children: Y  . Years of Education: N/A   Occupational History  .  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     Social History Main Topics  . Smoking status: Former Smoker -- 2.0 packs/day for 41 years    Types: Cigarettes    Quit date: 12/24/1997  . Smokeless tobacco: Former Neurosurgeon  . Alcohol Use: No  . Drug Use: No  . Sexually Active: Not on file   Other Topics Concern  . Not on file   Social History Narrative  . No narrative on file    Current Outpatient Prescriptions on File Prior to Visit  Medication Sig Dispense Refill  . albuterol (VENTOLIN HFA) 108 (90 BASE) MCG/ACT inhaler Inhale 2 puffs into the lungs every 6 (six) hours as needed.        Marland Kitchen allopurinol (ZYLOPRIM) 300 MG tablet TAKE 1 TABLET BY MOUTH DAILY  30 tablet  11  . aspirin 81 MG tablet Take 81 mg by mouth daily.        . Azelastine HCl (ASTEPRO) 0.15 % SOLN 2 sprays each nostril each am as needed  30 mL  2  . bisoprolol (ZEBETA) 5 MG tablet TAKE 1 TABLET BY MOUTH DAILY  90 tablet  2  . budesonide-formoterol (SYMBICORT) 160-4.5 MCG/ACT inhaler Inhale 2 puffs into the lungs 2 (two) times daily.        . calcitRIOL (ROCALTROL) 0.25 MCG capsule TAKE 1 CAPSULE BY MOUTH DAILY  90 capsule  3  . Calcium-Magnesium-Vitamin D 185-50-100 MG-MG-UNIT CAPS Take 1 capsule by mouth daily.        . citalopram (CELEXA) 20 MG tablet Take 1 tablet (20 mg total) by mouth daily.  30 tablet  6  . CRESTOR 10 MG tablet TAKE 1 TABLET BY MOUTH ONCE DAILY  30 tablet  11  . ferrous sulfate 325 (65 FE) MG tablet Take 325 mg by mouth daily with breakfast.       . finasteride (PROSCAR) 5 MG tablet Take 1 tablet (5 mg total) by mouth daily.  30 tablet  11  . FOLBIC 2.5-25-2 MG TABS TAKE 1 TABLET BY MOUTH EVERY DAY  90 tablet  2  . furosemide (LASIX) 40 MG tablet Take 2 tablets (80 mg total) by mouth 2 (two)  times daily. Take 80 mg by mouth every morning and 40 mg every evening  60 tablet  6  . glucose blood test strip Four times a day, variable glucoses dx 250.03      . insulin aspart (NOVOLOG) 100 UNIT/ML injection Inject into the skin 3 (three) times daily before meals. 3x a day (just before each meal) 10-02-14 units      . INSULIN SYRINGE 1CC/29G (B-D INSULIN SYRINGE) 29G X 1/2" 1 ML MISC 4 (four) times daily. Dx 250.03      . isosorbide mononitrate (IMDUR) 60 MG 24 hr tablet Take 1 tablet (60 mg total) by mouth daily.  30 tablet  11  . Multiple Vitamins-Minerals (MULTIVITAMIN PO) Take 1 tablet by mouth daily.        . nitroGLYCERIN (NITROLINGUAL) 0.4 MG/SPRAY spray Place 1 spray under the tongue as directed.        Marland Kitchen omeprazole (PRILOSEC) 20 MG capsule TAKE ONE CAPSULE BY MOUTH DAILY  90 capsule  2  . polyethylene glycol powder (MIRALAX) powder Take 17 g by mouth as needed.        . Protein (UNJURY UNFLAVORED PO) Take 8 oz by mouth daily.        Marland Kitchen TAZTIA XT 180 MG 24 hr capsule TAKE ONE CAPSULE BY MOUTH DAILY  90 capsule  3  . tiotropium (SPIRIVA) 18 MCG inhalation capsule Place 18 mcg into inhaler and inhale daily.       . traZODone (DESYREL) 100 MG tablet TAKE 1 TABLET BY MOUTH EVERY NIGHT AT BEDTIME  90 tablet  3  . triamcinolone (KENALOG) 0.025 % cream APPLY THREE TIMES DAILY ON AFFECTED AREA AS NEEDED  80 g  1    Allergies  Allergen Reactions  . Enalapril Maleate     REACTION: cough  . Shellfish-Derived Products Swelling    Family History  Problem Relation Age of Onset  . Hypertension      family history of htn  . Heart disease Maternal Aunt   . Lung cancer Mother   . Cancer Mother     Lung Cancer  . Heart disease Father     CHF    BP 128/62  Pulse 69  Temp 97.3 F (36.3 C) (Oral)  Ht 5\' 3"  (1.6 m)  Wt 216 lb (97.977 kg)  BMI 38.26 kg/m2  SpO2 94%    Review of Systems denies hypoglycemia    Objective:   Physical Exam Pulses: dorsalis pedis intact bilat.     Feet: no deformity.  no ulcer on the feet.  feet are of normal color and temp.  no edema Neuro: sensation is intact to touch on the feet.    Lab Results  Component Value Date   HGBA1C 7.4* 08/11/2012      Assessment & Plan:  DM: needs increased rx.  Further weight loss will help.

## 2012-08-12 ENCOUNTER — Encounter (HOSPITAL_COMMUNITY)
Admission: RE | Admit: 2012-08-12 | Discharge: 2012-08-12 | Disposition: A | Discharge status: Home / Self Care | Payer: Medicare Other | Source: Ambulatory Visit | Attending: Pulmonary Disease | Admitting: Pulmonary Disease

## 2012-08-13 ENCOUNTER — Telehealth: Payer: Self-pay | Admitting: *Deleted

## 2012-08-13 NOTE — Telephone Encounter (Signed)
Called pt to inform of lab results, pt informed via VM and to callback office with any questions/concerns (letter also mailed to pt).  

## 2012-08-14 ENCOUNTER — Encounter (HOSPITAL_COMMUNITY)
Admission: RE | Admit: 2012-08-14 | Discharge: 2012-08-14 | Disposition: A | Discharge status: Home / Self Care | Payer: Medicare Other | Source: Ambulatory Visit | Attending: Pulmonary Disease | Admitting: Pulmonary Disease

## 2012-08-15 ENCOUNTER — Other Ambulatory Visit: Payer: Self-pay | Admitting: Endocrinology

## 2012-08-15 NOTE — Telephone Encounter (Signed)
Last written 07/20/2012 #100 with 0 refills. Please advise.

## 2012-08-15 NOTE — Telephone Encounter (Signed)
Rx faxed to [facility]. 

## 2012-08-19 ENCOUNTER — Encounter (HOSPITAL_COMMUNITY)
Admission: RE | Admit: 2012-08-19 | Discharge: 2012-08-19 | Disposition: A | Discharge status: Home / Self Care | Payer: Medicare Other | Source: Ambulatory Visit | Attending: Pulmonary Disease | Admitting: Pulmonary Disease

## 2012-08-21 ENCOUNTER — Encounter (HOSPITAL_COMMUNITY)
Admission: RE | Admit: 2012-08-21 | Discharge: 2012-08-21 | Disposition: A | Discharge status: Home / Self Care | Payer: Medicare Other | Source: Ambulatory Visit | Attending: Pulmonary Disease | Admitting: Pulmonary Disease

## 2012-08-25 ENCOUNTER — Other Ambulatory Visit (HOSPITAL_COMMUNITY): Payer: Self-pay | Admitting: Adult Health

## 2012-08-26 ENCOUNTER — Other Ambulatory Visit (HOSPITAL_COMMUNITY): Payer: Self-pay | Admitting: Adult Health

## 2012-08-26 ENCOUNTER — Encounter (HOSPITAL_COMMUNITY)
Admission: RE | Admit: 2012-08-26 | Discharge: 2012-08-26 | Disposition: A | Discharge status: Home / Self Care | Payer: Medicare Other | Source: Ambulatory Visit | Attending: Pulmonary Disease | Admitting: Pulmonary Disease

## 2012-08-26 ENCOUNTER — Other Ambulatory Visit: Payer: Self-pay | Admitting: Pulmonary Disease

## 2012-08-26 DIAGNOSIS — I739 Peripheral vascular disease, unspecified: Secondary | ICD-10-CM | POA: Insufficient documentation

## 2012-08-26 DIAGNOSIS — I2510 Atherosclerotic heart disease of native coronary artery without angina pectoris: Secondary | ICD-10-CM | POA: Insufficient documentation

## 2012-08-26 DIAGNOSIS — J449 Chronic obstructive pulmonary disease, unspecified: Secondary | ICD-10-CM | POA: Insufficient documentation

## 2012-08-26 DIAGNOSIS — I129 Hypertensive chronic kidney disease with stage 1 through stage 4 chronic kidney disease, or unspecified chronic kidney disease: Secondary | ICD-10-CM | POA: Insufficient documentation

## 2012-08-26 DIAGNOSIS — J4489 Other specified chronic obstructive pulmonary disease: Secondary | ICD-10-CM | POA: Insufficient documentation

## 2012-08-26 DIAGNOSIS — I10 Essential (primary) hypertension: Secondary | ICD-10-CM | POA: Insufficient documentation

## 2012-08-26 DIAGNOSIS — I5032 Chronic diastolic (congestive) heart failure: Secondary | ICD-10-CM | POA: Insufficient documentation

## 2012-08-26 DIAGNOSIS — E119 Type 2 diabetes mellitus without complications: Secondary | ICD-10-CM | POA: Insufficient documentation

## 2012-08-26 DIAGNOSIS — N183 Chronic kidney disease, stage 3 unspecified: Secondary | ICD-10-CM | POA: Insufficient documentation

## 2012-08-26 DIAGNOSIS — Z5189 Encounter for other specified aftercare: Secondary | ICD-10-CM | POA: Insufficient documentation

## 2012-08-26 DIAGNOSIS — E785 Hyperlipidemia, unspecified: Secondary | ICD-10-CM | POA: Insufficient documentation

## 2012-08-26 MED ORDER — FUROSEMIDE 40 MG PO TABS: 80.0000 mg | ORAL_TABLET | Freq: Two times a day (BID) | ORAL | Status: DC

## 2012-08-26 NOTE — Addendum Note (Signed)
Addended by: Noralee Space on: 08/26/2012 04:35 PM   Modules accepted: Orders

## 2012-08-27 ENCOUNTER — Other Ambulatory Visit: Payer: Self-pay | Admitting: *Deleted

## 2012-08-27 MED ORDER — FINASTERIDE 5 MG PO TABS: 5.0000 mg | ORAL_TABLET | Freq: Every day | ORAL | Status: DC

## 2012-08-27 NOTE — Telephone Encounter (Signed)
R'cd fax from [facility] for refill of Finasteride.  

## 2012-08-28 ENCOUNTER — Encounter (HOSPITAL_COMMUNITY)
Admission: RE | Admit: 2012-08-28 | Discharge: 2012-08-28 | Disposition: A | Discharge status: Home / Self Care | Payer: Medicare Other | Source: Ambulatory Visit | Attending: Pulmonary Disease | Admitting: Pulmonary Disease

## 2012-09-02 ENCOUNTER — Encounter (HOSPITAL_COMMUNITY): Payer: Medicare Other

## 2012-09-04 ENCOUNTER — Encounter (HOSPITAL_COMMUNITY): Payer: Medicare Other

## 2012-09-09 ENCOUNTER — Encounter (HOSPITAL_COMMUNITY)
Admission: RE | Admit: 2012-09-09 | Discharge: 2012-09-09 | Disposition: A | Discharge status: Home / Self Care | Payer: Medicare Other | Source: Ambulatory Visit | Attending: Pulmonary Disease | Admitting: Pulmonary Disease

## 2012-09-09 DIAGNOSIS — Z23 Encounter for immunization: Secondary | ICD-10-CM | POA: Diagnosis not present

## 2012-09-10 ENCOUNTER — Other Ambulatory Visit: Payer: Self-pay | Admitting: Endocrinology

## 2012-09-10 ENCOUNTER — Other Ambulatory Visit: Payer: Self-pay | Admitting: *Deleted

## 2012-09-10 MED ORDER — ZOSTER VACCINE LIVE 19400 UNT/0.65ML ~~LOC~~ SOLR: 0.6500 mL | Freq: Once | SUBCUTANEOUS | Status: DC

## 2012-09-10 MED ORDER — PNEUMOCOCCAL VAC POLYVALENT 25 MCG/0.5ML IJ INJ: 0.5000 mL | INJECTION | Freq: Once | INTRAMUSCULAR | Status: DC

## 2012-09-10 NOTE — Telephone Encounter (Signed)
Last written 08/15/2012 #100 with 0 refills-please advise.

## 2012-09-10 NOTE — Telephone Encounter (Signed)
Rx faxed to [facility]. 

## 2012-09-10 NOTE — Telephone Encounter (Signed)
R'cd fax from [facility] for a refill of Zostavax.

## 2012-09-11 ENCOUNTER — Encounter (INDEPENDENT_AMBULATORY_CARE_PROVIDER_SITE_OTHER): Payer: Self-pay

## 2012-09-11 ENCOUNTER — Ambulatory Visit (INDEPENDENT_AMBULATORY_CARE_PROVIDER_SITE_OTHER): Payer: Medicare Other | Admitting: Physician Assistant

## 2012-09-11 ENCOUNTER — Encounter (HOSPITAL_COMMUNITY)
Admission: RE | Admit: 2012-09-11 | Discharge: 2012-09-11 | Disposition: A | Discharge status: Home / Self Care | Payer: Medicare Other | Source: Ambulatory Visit | Attending: Pulmonary Disease | Admitting: Pulmonary Disease

## 2012-09-11 DIAGNOSIS — Z9884 Bariatric surgery status: Secondary | ICD-10-CM | POA: Diagnosis not present

## 2012-09-11 NOTE — Progress Notes (Signed)
  HISTORY: [name]. is a 71 y.o.male who received an AP-Large lap-band in June 2012 by Dr. Ezzard Standing. He comes in with slight weight loss. He says he has regurgitation about 2-3 x a week and is mostly with dinner. He says he believes his primary issue is cleaning his plate when there's too much food. He says normally his hunger is under good control but notices that the regurgitation occurs when he's taken the first bite too quickly.  VITAL SIGNS: Filed Vitals:   09/11/12 1359  BP: 108/66  Pulse: 68  Temp: 97.4 F (36.3 C)  Resp: 16    PHYSICAL EXAM: Physical exam reveals a very well-appearing 71 y.o.male in no apparent distress Neurologic: Awake, alert, oriented Psych: Bright affect, conversant Respiratory: Breathing even and unlabored. No stridor or wheezing Extremities: Atraumatic, good range of motion. Skin: Warm, Dry, no rashes Musculoskeletal: Normal gait, Joints normal  ASSESMENT: 71 y.o.  male  s/p AP-Large lap-band.   PLAN: We discussed some of his weight loss barriers, primarily the portion size issue. He didn't believe a fill was warranted. We'll have him back in three months or sooner if needed.

## 2012-09-11 NOTE — Patient Instructions (Signed)
Return in three months or sooner if needed, especially if you have persistent vomiting, increasing hunger/portion sizes/weight.

## 2012-09-16 ENCOUNTER — Encounter (HOSPITAL_COMMUNITY)
Admission: RE | Admit: 2012-09-16 | Discharge: 2012-09-16 | Disposition: A | Discharge status: Home / Self Care | Payer: Medicare Other | Source: Ambulatory Visit | Attending: Pulmonary Disease | Admitting: Pulmonary Disease

## 2012-09-18 ENCOUNTER — Encounter (HOSPITAL_COMMUNITY)
Admission: RE | Admit: 2012-09-18 | Discharge: 2012-09-18 | Disposition: A | Discharge status: Home / Self Care | Payer: Medicare Other | Source: Ambulatory Visit | Attending: Pulmonary Disease | Admitting: Pulmonary Disease

## 2012-09-23 ENCOUNTER — Encounter (HOSPITAL_COMMUNITY)
Admission: RE | Admit: 2012-09-23 | Discharge: 2012-09-23 | Disposition: A | Discharge status: Home / Self Care | Payer: Self-pay | Source: Ambulatory Visit | Attending: Pulmonary Disease | Admitting: Pulmonary Disease

## 2012-09-23 DIAGNOSIS — I2510 Atherosclerotic heart disease of native coronary artery without angina pectoris: Secondary | ICD-10-CM | POA: Insufficient documentation

## 2012-09-23 DIAGNOSIS — I739 Peripheral vascular disease, unspecified: Secondary | ICD-10-CM | POA: Insufficient documentation

## 2012-09-23 DIAGNOSIS — J4489 Other specified chronic obstructive pulmonary disease: Secondary | ICD-10-CM | POA: Insufficient documentation

## 2012-09-23 DIAGNOSIS — E785 Hyperlipidemia, unspecified: Secondary | ICD-10-CM | POA: Insufficient documentation

## 2012-09-23 DIAGNOSIS — J449 Chronic obstructive pulmonary disease, unspecified: Secondary | ICD-10-CM | POA: Insufficient documentation

## 2012-09-23 DIAGNOSIS — N183 Chronic kidney disease, stage 3 unspecified: Secondary | ICD-10-CM | POA: Insufficient documentation

## 2012-09-23 DIAGNOSIS — I129 Hypertensive chronic kidney disease with stage 1 through stage 4 chronic kidney disease, or unspecified chronic kidney disease: Secondary | ICD-10-CM | POA: Insufficient documentation

## 2012-09-23 DIAGNOSIS — I10 Essential (primary) hypertension: Secondary | ICD-10-CM | POA: Insufficient documentation

## 2012-09-23 DIAGNOSIS — I5032 Chronic diastolic (congestive) heart failure: Secondary | ICD-10-CM | POA: Insufficient documentation

## 2012-09-23 DIAGNOSIS — E119 Type 2 diabetes mellitus without complications: Secondary | ICD-10-CM | POA: Insufficient documentation

## 2012-09-23 DIAGNOSIS — Z5189 Encounter for other specified aftercare: Secondary | ICD-10-CM | POA: Insufficient documentation

## 2012-09-24 ENCOUNTER — Encounter: Payer: Self-pay | Admitting: Pulmonary Disease

## 2012-09-24 ENCOUNTER — Ambulatory Visit (INDEPENDENT_AMBULATORY_CARE_PROVIDER_SITE_OTHER): Payer: Medicare Other | Admitting: Pulmonary Disease

## 2012-09-24 ENCOUNTER — Other Ambulatory Visit: Payer: Self-pay | Admitting: Endocrinology

## 2012-09-24 VITALS — BP 130/60 | HR 67 | Temp 98.3°F | Ht 63.0 in | Wt 220.6 lb

## 2012-09-24 DIAGNOSIS — J449 Chronic obstructive pulmonary disease, unspecified: Secondary | ICD-10-CM | POA: Diagnosis not present

## 2012-09-24 NOTE — Telephone Encounter (Signed)
Pt last seen on 08/11/2012.

## 2012-09-24 NOTE — Patient Instructions (Addendum)
No change in breathing medications. Continue with pulmonary rehab, and work on weight loss followup with me in 4-6 mos, but call if having issues with your breathing.

## 2012-09-24 NOTE — Progress Notes (Signed)
  Subjective:    Patient ID: [name] Koyanagi., male    DOB: August 02, 1941, 71 y.o.   MRN: [id number]  HPI Patient comes in today for followup of his known COPD.  He has been doing very well his current regimen, and has not had a recent exacerbation or pulmonary infection.  He is participating in pulmonary rehabilitation, and is continuing in the maintenance program.  He denies any significant cough or congestion.   Review of Systems  Constitutional: Negative for fever, chills, diaphoresis, activity change, appetite change, fatigue and unexpected weight change.  HENT: Positive for sinus pressure. Negative for nosebleeds, congestion, sore throat, rhinorrhea, sneezing, trouble swallowing, voice change, postnasal drip and tinnitus.   Eyes: Negative for photophobia, discharge, itching and visual disturbance.  Respiratory: Negative for cough, choking, chest tightness, shortness of breath and wheezing.   Cardiovascular: Negative for chest pain, palpitations and leg swelling.  Gastrointestinal: Negative for nausea and vomiting.  Genitourinary: Negative for dysuria, urgency, frequency, hematuria, flank pain, decreased urine volume and difficulty urinating.  Musculoskeletal: Negative for myalgias, back pain, joint swelling, arthralgias and gait problem.  Skin: Negative for color change, pallor and rash.  Neurological: Negative for dizziness, tremors, seizures, syncope, speech difficulty, weakness, light-headedness, numbness and headaches.  Hematological: Does not bruise/bleed easily.  Psychiatric/Behavioral: Negative for confusion, disturbed wake/sleep cycle and agitation. The patient is not nervous/anxious.        Objective:   Physical Exam Obese male in no acute distress Nose without purulence or discharge noted Chest with mild decrease in breath sounds, no wheezing Cardiac exam with regular rate and rhythm Lower extremities with mild edema, no cyanosis Alert and oriented, moves all 4  extremities.       Assessment & Plan:

## 2012-09-24 NOTE — Assessment & Plan Note (Signed)
The patient continues to do fairly well from a pulmonary standpoint.  He is continuing on his bronchodilator regimen, and participating in pulmonary rehabilitation.  I also encouraged him to work aggressively on weight loss.  He is already had his flu shot this fall.

## 2012-09-25 ENCOUNTER — Encounter (HOSPITAL_COMMUNITY)
Admission: RE | Admit: 2012-09-25 | Discharge: 2012-09-25 | Disposition: A | Discharge status: Home / Self Care | Payer: Self-pay | Source: Ambulatory Visit | Attending: Pulmonary Disease | Admitting: Pulmonary Disease

## 2012-09-28 ENCOUNTER — Other Ambulatory Visit: Payer: Self-pay | Admitting: Endocrinology

## 2012-09-30 ENCOUNTER — Encounter (HOSPITAL_COMMUNITY)
Admission: RE | Admit: 2012-09-30 | Discharge: 2012-09-30 | Disposition: A | Discharge status: Home / Self Care | Payer: Self-pay | Source: Ambulatory Visit | Attending: Pulmonary Disease | Admitting: Pulmonary Disease

## 2012-10-02 ENCOUNTER — Encounter (HOSPITAL_COMMUNITY)
Admission: RE | Admit: 2012-10-02 | Discharge: 2012-10-02 | Disposition: A | Discharge status: Home / Self Care | Payer: Self-pay | Source: Ambulatory Visit | Attending: Pulmonary Disease | Admitting: Pulmonary Disease

## 2012-10-02 ENCOUNTER — Other Ambulatory Visit: Payer: Self-pay | Admitting: *Deleted

## 2012-10-02 ENCOUNTER — Telehealth: Payer: Self-pay | Admitting: Endocrinology

## 2012-10-02 MED ORDER — ISOSORBIDE MONONITRATE ER 60 MG PO TB24: 60.0000 mg | ORAL_TABLET | Freq: Every day | ORAL | Status: DC

## 2012-10-02 NOTE — Telephone Encounter (Signed)
Spoke with pharmacist pt advised imdur was prescribed by dr. Clifton James, not [name], pt needs to contact cardiologist.

## 2012-10-02 NOTE — Telephone Encounter (Signed)
Pt needs urgent refill of IMDUR sent to [facility] pharmacy on [address]. Pharmacy states they have faxed request 3 times, but we have no received it. Please fill ASAP as pt is going out of town tomorrow morning. (10/03/2012). Please call pt once complete.

## 2012-10-02 NOTE — Telephone Encounter (Signed)
Fax Received. Refill Completed. [name] (R.M.A)   

## 2012-10-02 NOTE — Telephone Encounter (Signed)
Pt advised per pharmacist dr. Clifton James prescribed imdur not [name], pt needs to contact prescribing docctor

## 2012-10-07 ENCOUNTER — Encounter (HOSPITAL_COMMUNITY): Payer: Self-pay

## 2012-10-09 ENCOUNTER — Encounter (HOSPITAL_COMMUNITY): Payer: Self-pay

## 2012-10-13 ENCOUNTER — Other Ambulatory Visit: Payer: Self-pay | Admitting: Endocrinology

## 2012-10-13 NOTE — Telephone Encounter (Signed)
HYDROCODONE last refilled on 9/18, Rocatrol last filled on 09/27/11, and pt last seen on 8/19. Please advise.

## 2012-10-14 ENCOUNTER — Encounter (HOSPITAL_COMMUNITY): Payer: Self-pay

## 2012-10-16 ENCOUNTER — Encounter (HOSPITAL_COMMUNITY): Payer: Self-pay

## 2012-10-21 ENCOUNTER — Encounter (HOSPITAL_COMMUNITY)
Admission: RE | Admit: 2012-10-21 | Discharge: 2012-10-21 | Disposition: A | Discharge status: Home / Self Care | Payer: Self-pay | Source: Ambulatory Visit | Attending: Pulmonary Disease | Admitting: Pulmonary Disease

## 2012-10-23 ENCOUNTER — Encounter (HOSPITAL_COMMUNITY)
Admission: RE | Admit: 2012-10-23 | Discharge: 2012-10-23 | Disposition: A | Discharge status: Home / Self Care | Payer: Self-pay | Source: Ambulatory Visit | Attending: Pulmonary Disease | Admitting: Pulmonary Disease

## 2012-10-24 ENCOUNTER — Other Ambulatory Visit: Payer: Self-pay | Admitting: Endocrinology

## 2012-10-28 ENCOUNTER — Encounter (HOSPITAL_COMMUNITY)
Admission: RE | Admit: 2012-10-28 | Discharge: 2012-10-28 | Disposition: A | Discharge status: Home / Self Care | Payer: Self-pay | Source: Ambulatory Visit | Attending: Pulmonary Disease | Admitting: Pulmonary Disease

## 2012-10-28 DIAGNOSIS — I129 Hypertensive chronic kidney disease with stage 1 through stage 4 chronic kidney disease, or unspecified chronic kidney disease: Secondary | ICD-10-CM | POA: Insufficient documentation

## 2012-10-28 DIAGNOSIS — I5032 Chronic diastolic (congestive) heart failure: Secondary | ICD-10-CM | POA: Insufficient documentation

## 2012-10-28 DIAGNOSIS — I10 Essential (primary) hypertension: Secondary | ICD-10-CM | POA: Insufficient documentation

## 2012-10-28 DIAGNOSIS — J449 Chronic obstructive pulmonary disease, unspecified: Secondary | ICD-10-CM | POA: Insufficient documentation

## 2012-10-28 DIAGNOSIS — N183 Chronic kidney disease, stage 3 unspecified: Secondary | ICD-10-CM | POA: Insufficient documentation

## 2012-10-28 DIAGNOSIS — E785 Hyperlipidemia, unspecified: Secondary | ICD-10-CM | POA: Insufficient documentation

## 2012-10-28 DIAGNOSIS — Z5189 Encounter for other specified aftercare: Secondary | ICD-10-CM | POA: Insufficient documentation

## 2012-10-28 DIAGNOSIS — E119 Type 2 diabetes mellitus without complications: Secondary | ICD-10-CM | POA: Insufficient documentation

## 2012-10-28 DIAGNOSIS — J4489 Other specified chronic obstructive pulmonary disease: Secondary | ICD-10-CM | POA: Insufficient documentation

## 2012-10-28 DIAGNOSIS — I2510 Atherosclerotic heart disease of native coronary artery without angina pectoris: Secondary | ICD-10-CM | POA: Insufficient documentation

## 2012-10-28 DIAGNOSIS — I739 Peripheral vascular disease, unspecified: Secondary | ICD-10-CM | POA: Insufficient documentation

## 2012-10-30 ENCOUNTER — Encounter (HOSPITAL_COMMUNITY)
Admission: RE | Admit: 2012-10-30 | Discharge: 2012-10-30 | Disposition: A | Discharge status: Home / Self Care | Payer: Self-pay | Source: Ambulatory Visit | Attending: Pulmonary Disease | Admitting: Pulmonary Disease

## 2012-11-04 ENCOUNTER — Encounter (HOSPITAL_COMMUNITY): Payer: Self-pay

## 2012-11-06 ENCOUNTER — Encounter (HOSPITAL_COMMUNITY): Payer: Self-pay

## 2012-11-11 ENCOUNTER — Encounter (HOSPITAL_COMMUNITY): Payer: Self-pay

## 2012-11-12 ENCOUNTER — Ambulatory Visit: Payer: Medicare Other | Admitting: Endocrinology

## 2012-11-13 ENCOUNTER — Encounter (HOSPITAL_COMMUNITY): Payer: Self-pay

## 2012-11-18 ENCOUNTER — Encounter (HOSPITAL_COMMUNITY): Payer: Self-pay

## 2012-11-20 ENCOUNTER — Encounter (HOSPITAL_COMMUNITY): Payer: Self-pay

## 2012-11-24 ENCOUNTER — Other Ambulatory Visit: Payer: Self-pay | Admitting: Endocrinology

## 2012-11-24 ENCOUNTER — Ambulatory Visit (INDEPENDENT_AMBULATORY_CARE_PROVIDER_SITE_OTHER): Payer: Medicare Other | Admitting: Endocrinology

## 2012-11-24 ENCOUNTER — Encounter: Payer: Self-pay | Admitting: Endocrinology

## 2012-11-24 VITALS — BP 126/78 | HR 65 | Temp 98.4°F | Wt 217.0 lb

## 2012-11-24 DIAGNOSIS — Z79899 Other long term (current) drug therapy: Secondary | ICD-10-CM

## 2012-11-24 DIAGNOSIS — Z0000 Encounter for general adult medical examination without abnormal findings: Secondary | ICD-10-CM

## 2012-11-24 DIAGNOSIS — M109 Gout, unspecified: Secondary | ICD-10-CM | POA: Diagnosis not present

## 2012-11-24 DIAGNOSIS — E1029 Type 1 diabetes mellitus with other diabetic kidney complication: Secondary | ICD-10-CM | POA: Diagnosis not present

## 2012-11-24 DIAGNOSIS — Z125 Encounter for screening for malignant neoplasm of prostate: Secondary | ICD-10-CM | POA: Diagnosis not present

## 2012-11-24 DIAGNOSIS — E785 Hyperlipidemia, unspecified: Secondary | ICD-10-CM | POA: Diagnosis not present

## 2012-11-24 DIAGNOSIS — E119 Type 2 diabetes mellitus without complications: Secondary | ICD-10-CM | POA: Diagnosis not present

## 2012-11-24 NOTE — Progress Notes (Signed)
Subjective:    Patient ID: [name] Koyanagi., male    DOB: 01-19-1941, 71 y.o.   MRN: [id number]  HPI The state of at least three ongoing medical problems is addressed today, with interval history of each noted here: Pt returns for f/u of insulin-requiring DM (dx'ed 1991, complicated by renal insufficiency and CAD and PAD).  no cbg record, but states cbg's are well-controlled.  He says cbg's are highest in am (higher than at hs, despite no hs-snack).   HTN: he denies sob Gout: denies recent sxs Past Medical History  Diagnosis Date  . Allergic rhinitis   . COPD (chronic obstructive pulmonary disease)     mild to moderate by pfts in 2006  . HLD (hyperlipidemia)   . Impotence   . PVD (peripheral vascular disease)   . Type II or unspecified type diabetes mellitus with renal manifestations, not stated as uncontrolled(250.40)   . Renal insufficiency   . Osteoarthritis   . Cough     due to Zestril  . Encounter for long-term (current) use of aspirin   . Encounter for long-term (current) use of antiplatelets/antithrombotics(V58.63)   . Essential hypertension, benign   . Chronic diastolic heart failure   . Coronary atherosclerosis of native coronary artery   . Shortness of breath   . Other emphysema   . Edema   . Special screening for malignant neoplasm of prostate   . Conjunctivitis unspecified   . Hypopotassemia   . Osteoporosis, unspecified   . Secondary hyperparathyroidism (of renal origin)   . Gout, unspecified   . Pneumonia, organism unspecified   . 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 Hemiplegia affecting unspecified side, late effect of cerebrovascular disease   . Headache   . Other and unspecified hyperlipidemia   . Nephropathy, diabetic   . Cancer 04/01/12    skin x3  . CHF (congestive heart failure) previous hx  . Stroke 04-01-12    2007, mild   left arm weakness    Past Surgical History  Procedure Date   . Nasal sinus surgery   . Esophagogastroduodenoscopy   . Cardiac catheterization 01/18/2005  . Abdominal surgery   . Laparoscopic gastric banding 05/29/2011    History   Social History  . Marital Status: Married    Spouse Name: N/A    Number of Children: Y  . Years of Education: N/A   Occupational History  .  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     Social History Main Topics  . Smoking status: Former Smoker -- 2.0 packs/day for 41 years    Types: Cigarettes    Quit date: 12/24/1997  . Smokeless tobacco: Former Neurosurgeon  . Alcohol Use: No  . Drug Use: No  . Sexually Active: Not on file   Other Topics Concern  . Not on file   Social History Narrative  . No narrative on file    Current Outpatient Prescriptions on File Prior to Visit  Medication Sig Dispense Refill  . albuterol (VENTOLIN HFA) 108 (90 BASE) MCG/ACT inhaler Inhale 2 puffs into the lungs every 6 (six) hours as needed.        Marland Kitchen allopurinol (ZYLOPRIM) 300 MG tablet TAKE 1 TABLET BY MOUTH DAILY  30 tablet  11  . aspirin 81 MG tablet Take 81 mg by mouth daily.        . ASTEPRO 0.15 % SOLN INSTILL 2 SPRAYS INTO EACH NOSTRIL EVERY  MORNING AS NEEDED  30 mL  1  . bisoprolol (ZEBETA) 5 MG tablet TAKE 1 TABLET BY MOUTH DAILY  90 tablet  1  . budesonide-formoterol (SYMBICORT) 160-4.5 MCG/ACT inhaler Inhale 2 puffs into the lungs 2 (two) times daily.        . calcitRIOL (ROCALTROL) 0.25 MCG capsule TAKE 1 CAPSULE BY MOUTH DAILY  90 capsule  0  . Calcium-Magnesium-Vitamin D 185-50-100 MG-MG-UNIT CAPS Take 1 capsule by mouth daily.        . citalopram (CELEXA) 20 MG tablet Take 1 tablet (20 mg total) by mouth daily.  30 tablet  6  . CRESTOR 10 MG tablet TAKE 1 TABLET BY MOUTH ONCE DAILY  30 tablet  11  . ferrous sulfate 325 (65 FE) MG tablet Take 325 mg by mouth daily with breakfast.       . finasteride (PROSCAR) 5 MG tablet Take 1 tablet (5 mg total) by mouth daily.  30 tablet  5  . FOLBIC 2.5-25-2 MG TABS TAKE 1 TABLET BY MOUTH EVERY DAY   90 tablet  2  . furosemide (LASIX) 40 MG tablet Take 2 tablets (80 mg total) by mouth 2 (two) times daily.  360 tablet  3  . glucose blood test strip Four times a day, variable glucoses dx 250.03      . insulin aspart (NOVOLOG) 100 UNIT/ML injection Inject into the skin 3 (three) times daily before meals. 3x a day (just before each meal) 10-02-14 units      . INSULIN SYRINGE 1CC/29G (B-D INSULIN SYRINGE) 29G X 1/2" 1 ML MISC 4 (four) times daily. Dx 250.03      . isosorbide mononitrate (IMDUR) 60 MG 24 hr tablet Take 1 tablet (60 mg total) by mouth daily.  90 tablet  3  . Multiple Vitamins-Minerals (MULTIVITAMIN PO) Take 1 tablet by mouth daily.        . nitroGLYCERIN (NITROLINGUAL) 0.4 MG/SPRAY spray Place 1 spray under the tongue as directed.        Marland Kitchen omeprazole (PRILOSEC) 20 MG capsule TAKE ONE CAPSULE BY MOUTH DAILY  90 capsule  2  . polyethylene glycol powder (MIRALAX) powder Take 17 g by mouth as needed.        . Protein (UNJURY UNFLAVORED PO) Take 8 oz by mouth daily.        Marland Kitchen TAZTIA XT 180 MG 24 hr capsule TAKE ONE CAPSULE BY MOUTH DAILY  90 capsule  3  . tiotropium (SPIRIVA) 18 MCG inhalation capsule Place 18 mcg into inhaler and inhale daily.       . traZODone (DESYREL) 100 MG tablet TAKE 1 TABLET BY MOUTH EVERY NIGHT AT BEDTIME  90 tablet  3  . triamcinolone (KENALOG) 0.025 % cream APPLY THREE TIMES DAILY ON AFFECTED AREA AS NEEDED  80 g  1  . zoster vaccine live, PF, (ZOSTAVAX) 16109 UNT/0.65ML injection Inject 19,400 Units into the skin once.  1 each  0    Allergies  Allergen Reactions  . Enalapril Maleate     REACTION: cough  . Shellfish-Derived Products Swelling    Family History  Problem Relation Age of Onset  . Hypertension      family history of htn  . Heart disease Maternal Aunt   . Lung cancer Mother   . Cancer Mother     Lung Cancer  . Heart disease Father     CHF    BP 126/78  Pulse 65  Temp 98.4 F (36.9 C) (  Oral)  Wt 217 lb (98.431 kg)  SpO2  95%  Review of Systems Denies hypoglycemia and weight loss    Objective:   Physical Exam VITAL SIGNS:  See vs page GENERAL: no distress NECK: There is no palpable thyroid enlargement.  No thyroid nodule is palpable.  No palpable lymphadenopathy at the anterior neck. LUNGS:  Clear to auscultation HEART:  Regular rate and rhythm without murmurs noted. Normal S1,S2.   Pulses: no carotid bruit.    Lab Results  Component Value Date   WBC 9.9 12/20/2011   HGB 12.1* 12/20/2011   HCT 37.9* 12/20/2011   PLT 280 12/20/2011   GLUCOSE 189* 04/25/2012   CHOL 164 11/24/2012   TRIG 131 11/24/2012   HDL 48 11/24/2012   LDLCALC 90 11/24/2012   ALT 11 11/24/2012   AST 11 11/24/2012   NA 136 04/25/2012   K 3.7 04/25/2012   CL 98 04/25/2012   CREATININE 1.24 04/25/2012   BUN 37* 04/25/2012   CO2 25 04/25/2012   TSH 3.553 11/24/2012   PSA 1.75 11/24/2012   INR 1.03 12/20/2011   HGBA1C 7.4* 11/24/2012   MICROALBUR 1.1 10/31/2009      Assessment & Plan:  DM: needs increased rx, but further weight loss will help Gout, better recently UTI, new, uncertain etiology HTN: well-controlled   Subjective:   Patient here for Medicare annual wellness visit and management of other chronic and acute problems.     Risk factors: advanced age    Roster of Physicians Providing Medical Care to Patient:  See "snapshot"   Activities of Daily Living: In your present state of health, do you have any difficulty performing the following activities?:  Preparing food and eating?: No  Bathing yourself: No  Getting dressed: No  Using the toilet: No  Moving around from place to place: No  In the past year have you fallen or had a near fall?: No    Home Safety: Has smoke detector and wears seat belts. No firearms. No excess sun exposure.  Diet and Exercise  Current exercise habits: pt says good Dietary issues discussed: pt reports a healthy diet   Depression Screen  Q1: Over the past two weeks, have you felt down, depressed  or hopeless?no  Q2: Over the past two weeks, have you felt little interest or pleasure in doing things? no   The following portions of the patient's history were reviewed and updated as appropriate: allergies, current medications, past family history, past medical history, past social history, past surgical history and problem list.  Past Medical History  Diagnosis Date  . Allergic rhinitis   . COPD (chronic obstructive pulmonary disease)     mild to moderate by pfts in 2006  . HLD (hyperlipidemia)   . Impotence   . PVD (peripheral vascular disease)   . Type II or unspecified type diabetes mellitus with renal manifestations, not stated as uncontrolled(250.40)   . Renal insufficiency   . Osteoarthritis   . Cough     due to Zestril  . Encounter for long-term (current) use of aspirin   . Encounter for long-term (current) use of antiplatelets/antithrombotics(V58.63)   . Essential hypertension, benign   . Chronic diastolic heart failure   . Coronary atherosclerosis of native coronary artery   . Shortness of breath   . Other emphysema   . Edema   . Special screening for malignant neoplasm of prostate   . Conjunctivitis unspecified   . Hypopotassemia   . Osteoporosis, unspecified   .  Secondary hyperparathyroidism (of renal origin)   . Gout, unspecified   . Pneumonia, organism unspecified   . 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 Hemiplegia affecting unspecified side, late effect of cerebrovascular disease   . Headache   . Other and unspecified hyperlipidemia   . Nephropathy, diabetic   . Cancer 04/01/12    skin x3  . CHF (congestive heart failure) previous hx  . Stroke 04-01-12    2007, mild   left arm weakness    Past Surgical History  Procedure Date  . Nasal sinus surgery   . Esophagogastroduodenoscopy   . Cardiac catheterization 01/18/2005  . Abdominal surgery   . Laparoscopic gastric banding 05/29/2011     History   Social History  . Marital Status: Married    Spouse Name: N/A    Number of Children: Y  . Years of Education: N/A   Occupational History  .  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     Social History Main Topics  . Smoking status: Former Smoker -- 2.0 packs/day for 41 years    Types: Cigarettes    Quit date: 12/24/1997  . Smokeless tobacco: Former Neurosurgeon  . Alcohol Use: No  . Drug Use: No  . Sexually Active: Not on file   Other Topics Concern  . Not on file   Social History Narrative  . No narrative on file    Current Outpatient Prescriptions on File Prior to Visit  Medication Sig Dispense Refill  . albuterol (VENTOLIN HFA) 108 (90 BASE) MCG/ACT inhaler Inhale 2 puffs into the lungs every 6 (six) hours as needed.        Marland Kitchen allopurinol (ZYLOPRIM) 300 MG tablet TAKE 1 TABLET BY MOUTH DAILY  30 tablet  11  . aspirin 81 MG tablet Take 81 mg by mouth daily.        . ASTEPRO 0.15 % SOLN INSTILL 2 SPRAYS INTO EACH NOSTRIL EVERY MORNING AS NEEDED  30 mL  1  . bisoprolol (ZEBETA) 5 MG tablet TAKE 1 TABLET BY MOUTH DAILY  90 tablet  1  . budesonide-formoterol (SYMBICORT) 160-4.5 MCG/ACT inhaler Inhale 2 puffs into the lungs 2 (two) times daily.        . calcitRIOL (ROCALTROL) 0.25 MCG capsule TAKE 1 CAPSULE BY MOUTH DAILY  90 capsule  0  . Calcium-Magnesium-Vitamin D 185-50-100 MG-MG-UNIT CAPS Take 1 capsule by mouth daily.        . citalopram (CELEXA) 20 MG tablet Take 1 tablet (20 mg total) by mouth daily.  30 tablet  6  . CRESTOR 10 MG tablet TAKE 1 TABLET BY MOUTH ONCE DAILY  30 tablet  11  . ferrous sulfate 325 (65 FE) MG tablet Take 325 mg by mouth daily with breakfast.       . finasteride (PROSCAR) 5 MG tablet Take 1 tablet (5 mg total) by mouth daily.  30 tablet  5  . FOLBIC 2.5-25-2 MG TABS TAKE 1 TABLET BY MOUTH EVERY DAY  90 tablet  2  . furosemide (LASIX) 40 MG tablet Take 2 tablets (80 mg total) by mouth 2 (two) times daily.  360 tablet  3  . glucose blood test strip Four times  a day, variable glucoses dx 250.03      . HYDROcodone-acetaminophen (VICODIN) 5-500 MG per tablet TAKE 1-2 TABLETS BY MOUTH EVERY 4 HOURS AS NEEDED , NO MORE THAN 8 TABLETS PER 24 HOURS  100 tablet  0  . insulin aspart (NOVOLOG) 100 UNIT/ML injection Inject into the skin 3 (three) times daily before meals. 3x a day (just before each meal) 10-02-14 units      . INSULIN SYRINGE 1CC/29G (B-D INSULIN SYRINGE) 29G X 1/2" 1 ML MISC 4 (four) times daily. Dx 250.03      . isosorbide mononitrate (IMDUR) 60 MG 24 hr tablet Take 1 tablet (60 mg total) by mouth daily.  90 tablet  3  . Multiple Vitamins-Minerals (MULTIVITAMIN PO) Take 1 tablet by mouth daily.        . nitroGLYCERIN (NITROLINGUAL) 0.4 MG/SPRAY spray Place 1 spray under the tongue as directed.        Marland Kitchen omeprazole (PRILOSEC) 20 MG capsule TAKE ONE CAPSULE BY MOUTH DAILY  90 capsule  2  . polyethylene glycol powder (MIRALAX) powder Take 17 g by mouth as needed.        . Protein (UNJURY UNFLAVORED PO) Take 8 oz by mouth daily.        Marland Kitchen TAZTIA XT 180 MG 24 hr capsule TAKE ONE CAPSULE BY MOUTH DAILY  90 capsule  3  . tiotropium (SPIRIVA) 18 MCG inhalation capsule Place 18 mcg into inhaler and inhale daily.       . traZODone (DESYREL) 100 MG tablet TAKE 1 TABLET BY MOUTH EVERY NIGHT AT BEDTIME  90 tablet  3  . triamcinolone (KENALOG) 0.025 % cream APPLY THREE TIMES DAILY ON AFFECTED AREA AS NEEDED  80 g  1  . zoster vaccine live, PF, (ZOSTAVAX) 40981 UNT/0.65ML injection Inject 19,400 Units into the skin once.  1 each  0    Allergies  Allergen Reactions  . Enalapril Maleate     REACTION: cough  . Shellfish-Derived Products Swelling    Family History  Problem Relation Age of Onset  . Hypertension      family history of htn  . Heart disease Maternal Aunt   . Lung cancer Mother   . Cancer Mother     Lung Cancer  . Heart disease Father     CHF    BP 126/78  Pulse 65  Temp 98.4 F (36.9 C) (Oral)  Wt 217 lb (98.431 kg)  SpO2  95%   Review of Systems  Denies hearing loss, and visual loss Objective:   Vision:  Sees opthalmologist Hearing: grossly normal Body mass index:  See vs page Msk: pt easily and quickly performs "get-up-and-go" from a sitting position Cognitive Impairment Assessment: cognition, memory and judgment appear normal.  remembers 3/3 at 5 minutes.  excellent recall.  can easily read and write a sentence.  alert and oriented x 3  Assessment:   Medicare wellness utd on preventive parameters    Plan:   During the course of the visit the patient was educated and counseled about appropriate screening and preventive services including:        Fall prevention   Diabetes screening  Nutrition counseling   Vaccines / LABS Zostavax / Pnemococcal Vaccine  today  PSA  Patient Instructions (the written plan) was given to the patient.

## 2012-11-24 NOTE — Patient Instructions (Addendum)
check your blood sugar 2 times a day.  vary the time of day when you check, between before the 3 meals, and at bedtime.  also check if you have symptoms of your blood sugar being too high or too low.  please keep a record of the readings and bring it to your next appointment here.  please call us sooner if you are having low blood sugar episodes, or if it stays over 200.   continue humalog, 3x a day (just before each meal) 10-02-14 units.   blood tests are being requested for you today.  We'll contact you with results. You should have a vaccine against shingles (a painful rash which results from the  chickenpox infection which most people had many years ago).  This vaccine reduces, but does not totally eliminate the risk of shingles.  Because this is a medicare part d benefit, you should get it at a pharmacy.   Please come back for a follow-up appointment in 3 months.

## 2012-11-25 ENCOUNTER — Encounter (HOSPITAL_COMMUNITY)
Admission: RE | Admit: 2012-11-25 | Discharge: 2012-11-25 | Disposition: A | Discharge status: Home / Self Care | Payer: Self-pay | Source: Ambulatory Visit | Attending: Pulmonary Disease | Admitting: Pulmonary Disease

## 2012-11-25 DIAGNOSIS — J4489 Other specified chronic obstructive pulmonary disease: Secondary | ICD-10-CM | POA: Insufficient documentation

## 2012-11-25 DIAGNOSIS — Z5189 Encounter for other specified aftercare: Secondary | ICD-10-CM | POA: Insufficient documentation

## 2012-11-25 DIAGNOSIS — I5032 Chronic diastolic (congestive) heart failure: Secondary | ICD-10-CM | POA: Insufficient documentation

## 2012-11-25 DIAGNOSIS — E119 Type 2 diabetes mellitus without complications: Secondary | ICD-10-CM | POA: Insufficient documentation

## 2012-11-25 DIAGNOSIS — I129 Hypertensive chronic kidney disease with stage 1 through stage 4 chronic kidney disease, or unspecified chronic kidney disease: Secondary | ICD-10-CM | POA: Insufficient documentation

## 2012-11-25 DIAGNOSIS — I2510 Atherosclerotic heart disease of native coronary artery without angina pectoris: Secondary | ICD-10-CM | POA: Insufficient documentation

## 2012-11-25 DIAGNOSIS — N183 Chronic kidney disease, stage 3 unspecified: Secondary | ICD-10-CM | POA: Insufficient documentation

## 2012-11-25 DIAGNOSIS — I10 Essential (primary) hypertension: Secondary | ICD-10-CM | POA: Insufficient documentation

## 2012-11-25 DIAGNOSIS — I739 Peripheral vascular disease, unspecified: Secondary | ICD-10-CM | POA: Insufficient documentation

## 2012-11-25 DIAGNOSIS — E785 Hyperlipidemia, unspecified: Secondary | ICD-10-CM | POA: Insufficient documentation

## 2012-11-25 DIAGNOSIS — J449 Chronic obstructive pulmonary disease, unspecified: Secondary | ICD-10-CM | POA: Insufficient documentation

## 2012-11-25 LAB — HEMOGLOBIN A1C: Mean Plasma Glucose: 166 mg/dL — ABNORMAL HIGH (ref <117)

## 2012-11-25 LAB — HEPATIC FUNCTION PANEL
AST: 11 U/L (ref 0–37)
Alkaline Phosphatase: 69 U/L (ref 39–117)
Bilirubin, Direct: 0.1 mg/dL (ref 0.0–0.3)
Total Bilirubin: 0.4 mg/dL (ref 0.3–1.2)

## 2012-11-25 LAB — LIPID PANEL
HDL: 48 mg/dL (ref >39)
LDL Cholesterol: 90 mg/dL (ref 0–99)
Total CHOL/HDL Ratio: 3.4 Ratio
Triglycerides: 131 mg/dL (ref <150)

## 2012-11-25 LAB — TSH: TSH: 3.553 u[IU]/mL (ref 0.350–4.500)

## 2012-11-25 NOTE — Telephone Encounter (Signed)
Pt seen yesterday for gout and now requesting rf of vicodin.  Ok to refill?

## 2012-11-26 LAB — URINALYSIS, ROUTINE W REFLEX MICROSCOPIC
Glucose, UA: NEGATIVE mg/dL
Ketones, ur: NEGATIVE mg/dL
pH: 5 (ref 5.0–8.0)

## 2012-11-26 LAB — URINALYSIS, MICROSCOPIC ONLY
Casts: NONE SEEN
Crystals: NONE SEEN
WBC, UA: >50 WBC/hpf — AB (ref <3)

## 2012-11-26 MED ORDER — CIPROFLOXACIN HCL 500 MG PO TABS: 500.0000 mg | ORAL_TABLET | Freq: Two times a day (BID) | ORAL | Status: DC

## 2012-11-26 NOTE — Telephone Encounter (Signed)
i printed 

## 2012-11-27 ENCOUNTER — Encounter (HOSPITAL_COMMUNITY)
Admission: RE | Admit: 2012-11-27 | Discharge: 2012-11-27 | Disposition: A | Discharge status: Home / Self Care | Payer: Self-pay | Source: Ambulatory Visit | Attending: Pulmonary Disease | Admitting: Pulmonary Disease

## 2012-12-02 ENCOUNTER — Encounter (HOSPITAL_COMMUNITY)
Admission: RE | Admit: 2012-12-02 | Discharge: 2012-12-02 | Disposition: A | Discharge status: Home / Self Care | Payer: Self-pay | Source: Ambulatory Visit | Attending: Pulmonary Disease | Admitting: Pulmonary Disease

## 2012-12-04 ENCOUNTER — Encounter (HOSPITAL_COMMUNITY)
Admission: RE | Admit: 2012-12-04 | Discharge: 2012-12-04 | Disposition: A | Discharge status: Home / Self Care | Payer: Self-pay | Source: Ambulatory Visit | Attending: Pulmonary Disease | Admitting: Pulmonary Disease

## 2012-12-09 ENCOUNTER — Encounter (HOSPITAL_COMMUNITY)
Admission: RE | Admit: 2012-12-09 | Discharge: 2012-12-09 | Disposition: A | Discharge status: Home / Self Care | Payer: Self-pay | Source: Ambulatory Visit | Attending: Pulmonary Disease | Admitting: Pulmonary Disease

## 2012-12-11 ENCOUNTER — Encounter (HOSPITAL_COMMUNITY)
Admission: RE | Admit: 2012-12-11 | Discharge: 2012-12-11 | Disposition: A | Discharge status: Home / Self Care | Payer: Self-pay | Source: Ambulatory Visit | Attending: Pulmonary Disease | Admitting: Pulmonary Disease

## 2012-12-11 ENCOUNTER — Ambulatory Visit (INDEPENDENT_AMBULATORY_CARE_PROVIDER_SITE_OTHER): Payer: Medicare Other | Admitting: Physician Assistant

## 2012-12-11 ENCOUNTER — Encounter (INDEPENDENT_AMBULATORY_CARE_PROVIDER_SITE_OTHER): Payer: Self-pay

## 2012-12-11 VITALS — BP 112/58 | HR 70 | Temp 97.2°F | Ht 63.0 in | Wt 216.0 lb

## 2012-12-11 DIAGNOSIS — Z4651 Encounter for fitting and adjustment of gastric lap band: Secondary | ICD-10-CM | POA: Diagnosis not present

## 2012-12-11 NOTE — Patient Instructions (Signed)
Take clear liquids tonight. Thin protein shakes are ok to start tomorrow morning. Slowly advance your diet thereafter. Call us if you have persistent vomiting or regurgitation, night cough or reflux symptoms. Return as scheduled or sooner if you notice no changes in hunger/portion sizes.  

## 2012-12-11 NOTE — Progress Notes (Signed)
  HISTORY: [name]. is a 71 y.o.male who received an AP-Large lap-band in June 2012 by Dr. Ezzard Standing. He comes in with little weight change. He also has complaints of increased hunger and portion sizes. He denies persistent regurgitation symptoms. He had a good pulmonary rehab visit today as he was able to walk on the treadmill for 15 minutes without interruption. He hopes that this will help him with exercise in future. He also wants to visit [facility] in nutrition.  VITAL SIGNS: Filed Vitals:   12/11/12 1125  BP: 112/58  Pulse: 70  Temp: 97.2 F (36.2 C)    PHYSICAL EXAM: Physical exam reveals a very well-appearing 71 y.o.male in no apparent distress Neurologic: Awake, alert, oriented Psych: Bright affect, conversant Respiratory: Breathing even and unlabored. No stridor or wheezing Abdomen: Soft, nontender, nondistended to palpation. Incisions well-healed. No incisional hernias. Port easily palpated. Extremities: Atraumatic, good range of motion.  ASSESMENT: 71 y.o.  male  s/p AP-Large lap-band.   PLAN: The patient's port was accessed with a 20G Huber needle without difficulty. Clear fluid was aspirated and 0.5 mL saline was added to the port to give a total predicted volume of 9 mL. The patient was able to swallow water without difficulty following the procedure and was instructed to take clear liquids for the next 24-48 hours and advance slowly as tolerated.

## 2012-12-16 ENCOUNTER — Encounter (HOSPITAL_COMMUNITY)
Admission: RE | Admit: 2012-12-16 | Discharge: 2012-12-16 | Disposition: A | Discharge status: Home / Self Care | Payer: Self-pay | Source: Ambulatory Visit | Attending: Pulmonary Disease | Admitting: Pulmonary Disease

## 2012-12-18 ENCOUNTER — Encounter (HOSPITAL_COMMUNITY)
Admission: RE | Admit: 2012-12-18 | Discharge: 2012-12-18 | Disposition: A | Discharge status: Home / Self Care | Payer: Self-pay | Source: Ambulatory Visit | Attending: Pulmonary Disease | Admitting: Pulmonary Disease

## 2012-12-23 ENCOUNTER — Encounter (HOSPITAL_COMMUNITY)
Admission: RE | Admit: 2012-12-23 | Discharge: 2012-12-23 | Disposition: A | Discharge status: Home / Self Care | Payer: Self-pay | Source: Ambulatory Visit | Attending: Pulmonary Disease | Admitting: Pulmonary Disease

## 2012-12-24 ENCOUNTER — Other Ambulatory Visit (HOSPITAL_COMMUNITY): Payer: Self-pay | Admitting: Internal Medicine

## 2012-12-25 ENCOUNTER — Encounter (HOSPITAL_COMMUNITY)
Admission: RE | Admit: 2012-12-25 | Discharge: 2012-12-25 | Disposition: A | Discharge status: Home / Self Care | Payer: Self-pay | Source: Ambulatory Visit | Attending: Pulmonary Disease | Admitting: Pulmonary Disease

## 2012-12-25 DIAGNOSIS — I129 Hypertensive chronic kidney disease with stage 1 through stage 4 chronic kidney disease, or unspecified chronic kidney disease: Secondary | ICD-10-CM | POA: Insufficient documentation

## 2012-12-25 DIAGNOSIS — I5032 Chronic diastolic (congestive) heart failure: Secondary | ICD-10-CM | POA: Insufficient documentation

## 2012-12-25 DIAGNOSIS — J4489 Other specified chronic obstructive pulmonary disease: Secondary | ICD-10-CM | POA: Insufficient documentation

## 2012-12-25 DIAGNOSIS — I2510 Atherosclerotic heart disease of native coronary artery without angina pectoris: Secondary | ICD-10-CM | POA: Insufficient documentation

## 2012-12-25 DIAGNOSIS — E119 Type 2 diabetes mellitus without complications: Secondary | ICD-10-CM | POA: Insufficient documentation

## 2012-12-25 DIAGNOSIS — I10 Essential (primary) hypertension: Secondary | ICD-10-CM | POA: Insufficient documentation

## 2012-12-25 DIAGNOSIS — I739 Peripheral vascular disease, unspecified: Secondary | ICD-10-CM | POA: Insufficient documentation

## 2012-12-25 DIAGNOSIS — E785 Hyperlipidemia, unspecified: Secondary | ICD-10-CM | POA: Insufficient documentation

## 2012-12-25 DIAGNOSIS — Z5189 Encounter for other specified aftercare: Secondary | ICD-10-CM | POA: Insufficient documentation

## 2012-12-25 DIAGNOSIS — J449 Chronic obstructive pulmonary disease, unspecified: Secondary | ICD-10-CM | POA: Insufficient documentation

## 2012-12-25 DIAGNOSIS — N183 Chronic kidney disease, stage 3 unspecified: Secondary | ICD-10-CM | POA: Insufficient documentation

## 2012-12-26 ENCOUNTER — Other Ambulatory Visit: Payer: Self-pay

## 2012-12-29 ENCOUNTER — Telehealth: Payer: Self-pay | Admitting: Endocrinology

## 2012-12-29 ENCOUNTER — Telehealth (HOSPITAL_COMMUNITY): Payer: Self-pay | Admitting: Cardiology

## 2012-12-29 ENCOUNTER — Other Ambulatory Visit: Payer: Self-pay

## 2012-12-29 NOTE — Telephone Encounter (Signed)
Pt called to request a 90 day supply of citalopram. Will be going out of town soon and request his refill to be a # of 90

## 2012-12-29 NOTE — Telephone Encounter (Signed)
The patient called to request refill of citalopram 20mg .  The patient would like a 90 day supply.  The patient uses the [facility] on [address].  [phone] is the best phone number for the patient.

## 2012-12-30 ENCOUNTER — Other Ambulatory Visit: Payer: Self-pay

## 2012-12-30 ENCOUNTER — Telehealth: Payer: Self-pay | Admitting: *Deleted

## 2012-12-30 ENCOUNTER — Encounter (HOSPITAL_COMMUNITY)
Admission: RE | Admit: 2012-12-30 | Discharge: 2012-12-30 | Disposition: A | Discharge status: Home / Self Care | Payer: Self-pay | Source: Ambulatory Visit | Attending: Pulmonary Disease | Admitting: Pulmonary Disease

## 2012-12-30 ENCOUNTER — Other Ambulatory Visit: Payer: Self-pay | Admitting: *Deleted

## 2012-12-30 MED ORDER — CITALOPRAM HYDROBROMIDE 20 MG PO TABS: 20.0000 mg | ORAL_TABLET | Freq: Every day | ORAL | Status: DC

## 2012-12-30 MED ORDER — HYDROCODONE-ACETAMINOPHEN 5-500 MG PO TABS: 1.0000 | ORAL_TABLET | ORAL | Status: DC | PRN

## 2012-12-30 NOTE — Telephone Encounter (Signed)
i printed 

## 2012-12-30 NOTE — Telephone Encounter (Signed)
Patient request refill on hydrocodone/acet 5-500,lortab, last ov 11/24/2012 for gout. Last refill 11/24/2012. Please advise

## 2012-12-30 NOTE — Telephone Encounter (Signed)
Pt aware to contact pcp for refills

## 2012-12-30 NOTE — Telephone Encounter (Signed)
Patient request refill on lortab 5-500. Last ov 11/24/2012. Last refill 11/24/2012

## 2012-12-30 NOTE — Addendum Note (Signed)
Addended by: Romero Belling on: 12/30/2012 12:06 PM   Modules accepted: Orders

## 2012-12-31 ENCOUNTER — Other Ambulatory Visit: Payer: Self-pay

## 2012-12-31 MED ORDER — BISOPROLOL FUMARATE 5 MG PO TABS: 5.0000 mg | ORAL_TABLET | Freq: Every day | ORAL | Status: DC

## 2013-01-01 ENCOUNTER — Encounter (HOSPITAL_COMMUNITY)
Admission: RE | Admit: 2013-01-01 | Discharge: 2013-01-01 | Disposition: A | Discharge status: Home / Self Care | Payer: Self-pay | Source: Ambulatory Visit | Attending: Pulmonary Disease | Admitting: Pulmonary Disease

## 2013-01-02 ENCOUNTER — Other Ambulatory Visit: Payer: Self-pay

## 2013-01-02 ENCOUNTER — Other Ambulatory Visit: Payer: Self-pay | Admitting: Endocrinology

## 2013-01-02 NOTE — Telephone Encounter (Signed)
Tried to refill, could not refill "medication is pended by [name]."

## 2013-01-06 ENCOUNTER — Encounter (HOSPITAL_COMMUNITY): Payer: Self-pay

## 2013-01-08 ENCOUNTER — Encounter (HOSPITAL_COMMUNITY)
Admission: RE | Admit: 2013-01-08 | Discharge: 2013-01-08 | Disposition: A | Discharge status: Home / Self Care | Payer: Self-pay | Source: Ambulatory Visit | Attending: Pulmonary Disease | Admitting: Pulmonary Disease

## 2013-01-13 ENCOUNTER — Encounter (HOSPITAL_COMMUNITY)
Admission: RE | Admit: 2013-01-13 | Discharge: 2013-01-13 | Disposition: A | Discharge status: Home / Self Care | Payer: Self-pay | Source: Ambulatory Visit | Attending: Pulmonary Disease | Admitting: Pulmonary Disease

## 2013-01-13 NOTE — Progress Notes (Signed)
Patient has been attending the pulmonary rehab maintenance since August 2013 and doing well. Patient has lost weight like planned and doing more exercise machines in class i.e elliptical, rower, weight training, treadmill and recumbent bike. Patient was only doing the NuStep and arm crank for the longest time and now has the courage and determination to do those listed and doing a great job with them. [name] completed a 6 minute walk test and maintained oxygen saturations greater than or equal to 94% on RA. Bps were stable. He continues to show improvements in his strength, endurance, and all around well being. Staff will continue to show support.

## 2013-01-15 ENCOUNTER — Encounter (HOSPITAL_COMMUNITY): Payer: Self-pay

## 2013-01-20 ENCOUNTER — Telehealth: Payer: Self-pay | Admitting: Endocrinology

## 2013-01-20 ENCOUNTER — Encounter (HOSPITAL_COMMUNITY)
Admission: RE | Admit: 2013-01-20 | Discharge: 2013-01-20 | Disposition: A | Discharge status: Home / Self Care | Payer: Self-pay | Source: Ambulatory Visit | Attending: Pulmonary Disease | Admitting: Pulmonary Disease

## 2013-01-20 NOTE — Telephone Encounter (Signed)
Pt advised and states an understanding 

## 2013-01-20 NOTE — Telephone Encounter (Signed)
Patient called complaining that he has to pick up prescription for Vicodin.  States that you have always faxed or called in in the past.  I explained our narcotics refill policy but he still does not understand.  He wanted me to ask you if the policy really applies to him.

## 2013-01-20 NOTE — Telephone Encounter (Signed)
please call patient: We have to observe office policies

## 2013-01-22 ENCOUNTER — Encounter (HOSPITAL_COMMUNITY)
Admission: RE | Admit: 2013-01-22 | Discharge: 2013-01-22 | Disposition: A | Discharge status: Home / Self Care | Payer: Self-pay | Source: Ambulatory Visit | Attending: Pulmonary Disease | Admitting: Pulmonary Disease

## 2013-01-27 ENCOUNTER — Encounter (HOSPITAL_COMMUNITY): Payer: Self-pay

## 2013-01-27 DIAGNOSIS — I10 Essential (primary) hypertension: Secondary | ICD-10-CM | POA: Insufficient documentation

## 2013-01-27 DIAGNOSIS — Z5189 Encounter for other specified aftercare: Secondary | ICD-10-CM | POA: Insufficient documentation

## 2013-01-27 DIAGNOSIS — J449 Chronic obstructive pulmonary disease, unspecified: Secondary | ICD-10-CM | POA: Insufficient documentation

## 2013-01-27 DIAGNOSIS — N183 Chronic kidney disease, stage 3 unspecified: Secondary | ICD-10-CM | POA: Insufficient documentation

## 2013-01-27 DIAGNOSIS — I739 Peripheral vascular disease, unspecified: Secondary | ICD-10-CM | POA: Insufficient documentation

## 2013-01-27 DIAGNOSIS — I2510 Atherosclerotic heart disease of native coronary artery without angina pectoris: Secondary | ICD-10-CM | POA: Insufficient documentation

## 2013-01-27 DIAGNOSIS — E785 Hyperlipidemia, unspecified: Secondary | ICD-10-CM | POA: Insufficient documentation

## 2013-01-27 DIAGNOSIS — E119 Type 2 diabetes mellitus without complications: Secondary | ICD-10-CM | POA: Insufficient documentation

## 2013-01-27 DIAGNOSIS — I129 Hypertensive chronic kidney disease with stage 1 through stage 4 chronic kidney disease, or unspecified chronic kidney disease: Secondary | ICD-10-CM | POA: Insufficient documentation

## 2013-01-27 DIAGNOSIS — J4489 Other specified chronic obstructive pulmonary disease: Secondary | ICD-10-CM | POA: Insufficient documentation

## 2013-01-27 DIAGNOSIS — I5032 Chronic diastolic (congestive) heart failure: Secondary | ICD-10-CM | POA: Insufficient documentation

## 2013-01-29 ENCOUNTER — Encounter (HOSPITAL_COMMUNITY): Payer: Self-pay

## 2013-02-03 ENCOUNTER — Encounter (HOSPITAL_COMMUNITY): Payer: Self-pay

## 2013-02-05 ENCOUNTER — Encounter (HOSPITAL_COMMUNITY): Payer: Self-pay

## 2013-02-09 ENCOUNTER — Other Ambulatory Visit: Payer: Self-pay | Admitting: Endocrinology

## 2013-02-10 ENCOUNTER — Encounter (HOSPITAL_COMMUNITY)
Admission: RE | Admit: 2013-02-10 | Discharge: 2013-02-10 | Disposition: A | Discharge status: Home / Self Care | Payer: Self-pay | Source: Ambulatory Visit | Attending: Pulmonary Disease | Admitting: Pulmonary Disease

## 2013-02-10 ENCOUNTER — Other Ambulatory Visit: Payer: Self-pay | Admitting: Dermatology

## 2013-02-10 DIAGNOSIS — D0460 Carcinoma in situ of skin of unspecified upper limb, including shoulder: Secondary | ICD-10-CM | POA: Diagnosis not present

## 2013-02-10 DIAGNOSIS — Z85828 Personal history of other malignant neoplasm of skin: Secondary | ICD-10-CM | POA: Diagnosis not present

## 2013-02-10 DIAGNOSIS — D485 Neoplasm of uncertain behavior of skin: Secondary | ICD-10-CM | POA: Diagnosis not present

## 2013-02-10 DIAGNOSIS — L570 Actinic keratosis: Secondary | ICD-10-CM | POA: Diagnosis not present

## 2013-02-10 DIAGNOSIS — D239 Other benign neoplasm of skin, unspecified: Secondary | ICD-10-CM | POA: Diagnosis not present

## 2013-02-12 ENCOUNTER — Encounter (HOSPITAL_COMMUNITY)
Admission: RE | Admit: 2013-02-12 | Discharge: 2013-02-12 | Disposition: A | Discharge status: Home / Self Care | Payer: Self-pay | Source: Ambulatory Visit | Attending: Pulmonary Disease | Admitting: Pulmonary Disease

## 2013-02-17 ENCOUNTER — Encounter (HOSPITAL_COMMUNITY)
Admission: RE | Admit: 2013-02-17 | Discharge: 2013-02-17 | Disposition: A | Discharge status: Home / Self Care | Payer: Self-pay | Source: Ambulatory Visit | Attending: Pulmonary Disease | Admitting: Pulmonary Disease

## 2013-02-19 ENCOUNTER — Encounter (HOSPITAL_COMMUNITY)
Admission: RE | Admit: 2013-02-19 | Discharge: 2013-02-19 | Disposition: A | Discharge status: Home / Self Care | Payer: Self-pay | Source: Ambulatory Visit | Attending: Pulmonary Disease | Admitting: Pulmonary Disease

## 2013-02-23 ENCOUNTER — Encounter: Payer: Self-pay | Admitting: Endocrinology

## 2013-02-23 ENCOUNTER — Ambulatory Visit (INDEPENDENT_AMBULATORY_CARE_PROVIDER_SITE_OTHER): Payer: Medicare Other | Admitting: Endocrinology

## 2013-02-23 VITALS — BP 132/80 | HR 80 | Wt 214.0 lb

## 2013-02-23 DIAGNOSIS — I10 Essential (primary) hypertension: Secondary | ICD-10-CM

## 2013-02-23 DIAGNOSIS — I5032 Chronic diastolic (congestive) heart failure: Secondary | ICD-10-CM

## 2013-02-23 DIAGNOSIS — R0602 Shortness of breath: Secondary | ICD-10-CM

## 2013-02-23 DIAGNOSIS — N390 Urinary tract infection, site not specified: Secondary | ICD-10-CM

## 2013-02-23 DIAGNOSIS — I509 Heart failure, unspecified: Secondary | ICD-10-CM

## 2013-02-23 DIAGNOSIS — C44621 Squamous cell carcinoma of skin of unspecified upper limb, including shoulder: Secondary | ICD-10-CM

## 2013-02-23 DIAGNOSIS — E1029 Type 1 diabetes mellitus with other diabetic kidney complication: Secondary | ICD-10-CM

## 2013-02-23 LAB — URINALYSIS, ROUTINE W REFLEX MICROSCOPIC
Ketones, ur: NEGATIVE
Specific Gravity, Urine: 1.015 (ref 1.000–1.030)
Total Protein, Urine: 30
Urine Glucose: NEGATIVE

## 2013-02-23 MED ORDER — ROSUVASTATIN CALCIUM 10 MG PO TABS: ORAL_TABLET | ORAL | Status: DC

## 2013-02-23 MED ORDER — ACARBOSE 25 MG PO TABS: 25.0000 mg | ORAL_TABLET | Freq: Three times a day (TID) | ORAL | Status: DC

## 2013-02-23 NOTE — Patient Instructions (Addendum)
check your blood sugar 2 times a day.  vary the time of day when you check, between before the 3 meals, and at bedtime.  also check if you have symptoms of your blood sugar being too high or too low.  please keep a record of the readings and bring it to your next appointment here.  please call us sooner if you are having low blood sugar episodes, or if it stays over 200.   blood and urine tests are being requested for you today.  We'll contact you with results. Please reduce the insulin to 10 units 3 times a day (just before each meal) Please add "acarbose."  i have sent a prescription to your pharmacy.  Side-effects are loose bowels, and intestinal gas.   Please call in 1-2 weeks to report progress.  We may be able to increase the acarbose, add "bromocriptine," add reduce the insulin further.   In my opinion, you are fine for your cataract surgery.

## 2013-02-23 NOTE — Progress Notes (Signed)
  Subjective:    Patient ID: [name]., male    DOB: [birth year], 72 y.o.   MRN: [id number]  HPI The state of at least three ongoing medical problems is addressed today, with interval history of each noted here: Pt returns for f/u of insulin-requiring DM (dx'ed 1991; he has been on insulin since 2005; complicated by renal insufficiency and CAD and PAD; he underwent lap-band placement in June 2012).  He has lost 30 lbs since the surgery.  He had his last "fill" of the gastric band 3 mos ago.  Since then, he notices it is more difficult to eat, but that he has not lost weight.  no cbg record, but states cbg's are well-controlled.  He has an episode of mild hypoglycemia yesterday, after the evening meal.  The says there is no trend throughout the day.   He was rx'ed for uti at last ov.  He denies fever.  He had no sxs with the uti Pt also has h/o doe, which is probably multifactorial.  He says this is improved recently.  Review of Systems Denies LOC and dysuria    Objective:   Physical Exam VITAL SIGNS:  See vs page GENERAL: no distress Pulses: dorsalis pedis intact bilat.   Feet: no deformity.  no ulcer on the feet.  feet are of normal color and temp.  Trace bilat leg edema, and bilateral varicosities.  Neuro: sensation is intact to touch on the feet.       Assessment & Plan:  UTI, uncertain etiology DM.  He can try to transition to oral agents. DOE, multifactorial

## 2013-02-24 ENCOUNTER — Telehealth: Payer: Self-pay | Admitting: *Deleted

## 2013-02-24 ENCOUNTER — Encounter (HOSPITAL_COMMUNITY): Payer: Self-pay

## 2013-02-24 ENCOUNTER — Telehealth: Payer: Self-pay | Admitting: Endocrinology

## 2013-02-24 DIAGNOSIS — N183 Chronic kidney disease, stage 3 unspecified: Secondary | ICD-10-CM | POA: Insufficient documentation

## 2013-02-24 DIAGNOSIS — I5032 Chronic diastolic (congestive) heart failure: Secondary | ICD-10-CM | POA: Insufficient documentation

## 2013-02-24 DIAGNOSIS — I2510 Atherosclerotic heart disease of native coronary artery without angina pectoris: Secondary | ICD-10-CM | POA: Insufficient documentation

## 2013-02-24 DIAGNOSIS — Z5189 Encounter for other specified aftercare: Secondary | ICD-10-CM | POA: Insufficient documentation

## 2013-02-24 DIAGNOSIS — I129 Hypertensive chronic kidney disease with stage 1 through stage 4 chronic kidney disease, or unspecified chronic kidney disease: Secondary | ICD-10-CM | POA: Insufficient documentation

## 2013-02-24 DIAGNOSIS — E785 Hyperlipidemia, unspecified: Secondary | ICD-10-CM | POA: Insufficient documentation

## 2013-02-24 DIAGNOSIS — J4489 Other specified chronic obstructive pulmonary disease: Secondary | ICD-10-CM | POA: Insufficient documentation

## 2013-02-24 DIAGNOSIS — I10 Essential (primary) hypertension: Secondary | ICD-10-CM | POA: Insufficient documentation

## 2013-02-24 DIAGNOSIS — E119 Type 2 diabetes mellitus without complications: Secondary | ICD-10-CM | POA: Insufficient documentation

## 2013-02-24 DIAGNOSIS — I739 Peripheral vascular disease, unspecified: Secondary | ICD-10-CM | POA: Insufficient documentation

## 2013-02-24 MED ORDER — HYDROCODONE-ACETAMINOPHEN 10-325 MG PO TABS: 1.0000 | ORAL_TABLET | Freq: Four times a day (QID) | ORAL | Status: DC | PRN

## 2013-02-24 NOTE — Telephone Encounter (Signed)
Letter from [facility] has been placed in your green folder for your review and signature. Thanks.

## 2013-02-24 NOTE — Telephone Encounter (Signed)
i printed new rx

## 2013-02-24 NOTE — Telephone Encounter (Signed)
The patient called and left message on office voicemail to state that [facility] denied his hydrocodone rx stating it was too high of a dosage and he will be going out of town and needs his rx.  Please call the patient at [phone] to advise.

## 2013-02-24 NOTE — Telephone Encounter (Signed)
done

## 2013-02-25 ENCOUNTER — Other Ambulatory Visit: Payer: Self-pay | Admitting: Pulmonary Disease

## 2013-02-25 ENCOUNTER — Other Ambulatory Visit: Payer: Self-pay | Admitting: Endocrinology

## 2013-02-26 ENCOUNTER — Other Ambulatory Visit: Payer: Self-pay

## 2013-02-26 ENCOUNTER — Encounter (HOSPITAL_COMMUNITY)
Admission: RE | Admit: 2013-02-26 | Discharge: 2013-02-26 | Disposition: A | Discharge status: Home / Self Care | Payer: Self-pay | Source: Ambulatory Visit | Attending: Pulmonary Disease | Admitting: Pulmonary Disease

## 2013-02-26 MED ORDER — AZELASTINE HCL 0.15 % NA SOLN: NASAL | Status: DC

## 2013-03-03 ENCOUNTER — Encounter (HOSPITAL_COMMUNITY): Payer: Self-pay

## 2013-03-05 ENCOUNTER — Encounter (INDEPENDENT_AMBULATORY_CARE_PROVIDER_SITE_OTHER): Payer: Medicare Other

## 2013-03-05 ENCOUNTER — Encounter (HOSPITAL_COMMUNITY): Payer: Self-pay

## 2013-03-10 ENCOUNTER — Encounter (HOSPITAL_COMMUNITY): Payer: Self-pay

## 2013-03-12 ENCOUNTER — Encounter (INDEPENDENT_AMBULATORY_CARE_PROVIDER_SITE_OTHER): Payer: Self-pay

## 2013-03-12 ENCOUNTER — Encounter (HOSPITAL_COMMUNITY): Payer: Self-pay

## 2013-03-12 ENCOUNTER — Ambulatory Visit (INDEPENDENT_AMBULATORY_CARE_PROVIDER_SITE_OTHER): Payer: Medicare Other | Admitting: Physician Assistant

## 2013-03-12 VITALS — BP 132/70 | HR 70 | Temp 97.5°F | Resp 18 | Ht 63.0 in | Wt 212.6 lb

## 2013-03-12 NOTE — Patient Instructions (Signed)
Take clear liquids tonight. Thin protein shakes are ok to start tomorrow morning. Slowly advance your diet thereafter. Call us if you have persistent vomiting or regurgitation, night cough or reflux symptoms. Return as scheduled or sooner if you notice no changes in hunger/portion sizes.  

## 2013-03-12 NOTE — Progress Notes (Signed)
  HISTORY: [name]. is a 72 y.o.male who received an AP-Large lap-band in June 2012 by Dr. Ezzard Standing. He comes in with increase in hunger but no increase in portion sizes. He's had no persistent regurgitation or reflux. He would like a small fill to help him continue to lose weight. He's lost 32 lbs since surgery.  VITAL SIGNS: Filed Vitals:   03/12/13 1132  BP: 132/70  Pulse: 70  Temp: 97.5 F (36.4 C)  Resp: 18    PHYSICAL EXAM: Physical exam reveals a very well-appearing 71 y.o.male in no apparent distress Neurologic: Awake, alert, oriented Psych: Bright affect, conversant Respiratory: Breathing even and unlabored. No stridor or wheezing Abdomen: Soft, nontender, nondistended to palpation. Incisions well-healed. No incisional hernias. Port easily palpated. Extremities: Atraumatic, good range of motion.  ASSESMENT: 72 y.o.  male  s/p AP-Large lap-band.   PLAN: The patient's port was accessed with a 20G Huber needle without difficulty. Clear fluid was aspirated and 0.25 mL saline was added to the port to give a total predicted volume of 9.25 mL. The patient was able to swallow water without difficulty following the procedure and was instructed to take clear liquids for the next 24-48 hours and advance slowly as tolerated.

## 2013-03-17 ENCOUNTER — Encounter (HOSPITAL_COMMUNITY)
Admission: RE | Admit: 2013-03-17 | Discharge: 2013-03-17 | Disposition: A | Discharge status: Home / Self Care | Payer: Self-pay | Source: Ambulatory Visit | Attending: Pulmonary Disease | Admitting: Pulmonary Disease

## 2013-03-19 ENCOUNTER — Encounter (HOSPITAL_COMMUNITY)
Admission: RE | Admit: 2013-03-19 | Discharge: 2013-03-19 | Disposition: A | Discharge status: Home / Self Care | Payer: Self-pay | Source: Ambulatory Visit | Attending: Pulmonary Disease | Admitting: Pulmonary Disease

## 2013-03-19 ENCOUNTER — Encounter: Payer: Self-pay | Admitting: Cardiovascular Disease

## 2013-03-19 ENCOUNTER — Ambulatory Visit (INDEPENDENT_AMBULATORY_CARE_PROVIDER_SITE_OTHER): Payer: Medicare Other | Admitting: Cardiovascular Disease

## 2013-03-19 VITALS — BP 120/54 | HR 70 | Ht 63.0 in | Wt 210.8 lb

## 2013-03-19 DIAGNOSIS — I5032 Chronic diastolic (congestive) heart failure: Secondary | ICD-10-CM | POA: Diagnosis not present

## 2013-03-19 DIAGNOSIS — I2510 Atherosclerotic heart disease of native coronary artery without angina pectoris: Secondary | ICD-10-CM

## 2013-03-19 MED ORDER — FUROSEMIDE 40 MG PO TABS: 40.0000 mg | ORAL_TABLET | ORAL | Status: DC

## 2013-03-19 NOTE — Progress Notes (Signed)
History of Present Illness: 72 yo [name] with h/o CAD, diastolic CHF, HTN,CRI, hyperlipidemia, PAD, DM here today for cardiac follow up. He has been followed in the past by Dr. [name]. I saw him for the first time in March 2012. He has had multiple prior PCI procedures. He has chronic diastolic heart failure. Stress myoview in February 2012 with no ischemia. This was done as part of the pre-operative workup before a planned lap band surgery. He has lost 59 pounds since his surgery. He has had issues with volume overload over the last year and has been followed in the CHF clinic. He has been taking Lasix and compliant with all medications but still not compliant with his diet. He was last seen in the CHF clinic on 04/25/12. His COPD is followed in the pulmonary clinic by Dr. Shelle Iron.   He is here today for follow up. He feels great. He denies any chest pain or SOB. He states that his breathing has improved. He is still in the maintenance program for cardiac rehab. He goes to the [facility] several times per week.   Primary Care Physician: [name]  Last Lipid Profile:Lipid Panel     Component Value Date/Time   CHOL 164 11/24/2012 1344   TRIG 131 11/24/2012 1344   HDL 48 11/24/2012 1344   CHOLHDL 3.4 11/24/2012 1344   VLDL 26 11/24/2012 1344   LDLCALC 90 11/24/2012 1344     Past Medical History  Diagnosis Date  . Allergic rhinitis   . COPD (chronic obstructive pulmonary disease)     mild to moderate by pfts in 2006  . HLD (hyperlipidemia)   . Impotence   . PVD (peripheral vascular disease)   . Type II or unspecified type diabetes mellitus with renal manifestations, not stated as uncontrolled(250.40)   . Renal insufficiency   . Osteoarthritis   . Cough     due to Zestril  . Encounter for long-term (current) use of aspirin   . Encounter for long-term (current) use of antiplatelets/antithrombotics(V58.63)   . Essential hypertension, benign   . Chronic diastolic heart failure   . Coronary  atherosclerosis of native coronary artery   . Shortness of breath   . Other emphysema   . Edema   . Special screening for malignant neoplasm of prostate   . Conjunctivitis unspecified   . Hypopotassemia   . Osteoporosis, unspecified   . Secondary hyperparathyroidism (of renal origin)   . Gout, unspecified   . Pneumonia, organism unspecified   . 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 Hemiplegia affecting unspecified side, late effect of cerebrovascular disease   . Headache   . Other and unspecified hyperlipidemia   . Nephropathy, diabetic   . Cancer 04/01/12    skin x3  . CHF (congestive heart failure) previous hx  . Stroke 04-01-12    2007, mild   left arm weakness    Past Surgical History  Procedure Laterality Date  . Nasal sinus surgery    . Esophagogastroduodenoscopy    . Cardiac catheterization  01/18/2005  . Abdominal surgery    . Laparoscopic gastric banding  05/29/2011    Current Outpatient Prescriptions  Medication Sig Dispense Refill  . acarbose (PRECOSE) 25 MG tablet Take 1 tablet (25 mg total) by mouth 3 (three) times daily with meals.  270 tablet  11  . albuterol (VENTOLIN HFA) 108 (90 BASE) MCG/ACT inhaler Inhale 2 puffs into the lungs  every 6 (six) hours as needed.        Marland Kitchen allopurinol (ZYLOPRIM) 300 MG tablet TAKE 1 TABLET BY MOUTH DAILY  30 tablet  11  . aspirin 81 MG tablet Take 81 mg by mouth daily.        . Azelastine HCl (ASTEPRO) 0.15 % SOLN INSTILL 2 SPRAYS INTO EACH NOSTRIL EVERY MORNING AS NEEDED  30 mL  1  . bisoprolol (ZEBETA) 5 MG tablet Take 1 tablet (5 mg total) by mouth daily.  90 tablet  1  . budesonide-formoterol (SYMBICORT) 160-4.5 MCG/ACT inhaler Inhale 2 puffs into the lungs 2 (two) times daily.        . calcitRIOL (ROCALTROL) 0.25 MCG capsule TAKE ONE CAPSULE BY MOUTH DAILY  90 capsule  0  . citalopram (CELEXA) 20 MG tablet Take 1 tablet (20 mg total) by mouth daily.  30 tablet   6  . ferrous sulfate 325 (65 FE) MG tablet Take 325 mg by mouth daily with breakfast.       . finasteride (PROSCAR) 5 MG tablet TAKE 1 TABLET BY MOUTH EVERY DAY  30 tablet  1  . FOLBIC 2.5-25-2 MG TABS TAKE 1 TABLET BY MOUTH EVERY DAY  90 tablet  2  . furosemide (LASIX) 40 MG tablet Take 40 mg by mouth as directed. 80 MG IN THE AM AND 120 MG IN THE PM      . glucose blood test strip Four times a day, variable glucoses dx 250.03      . HYDROcodone-acetaminophen (NORCO) 10-325 MG per tablet Take 1 tablet by mouth every 6 (six) hours as needed for pain.  100 tablet  5  . insulin aspart (NOVOLOG) 100 UNIT/ML injection Inject 10 Units into the skin 3 (three) times daily before meals.       . INSULIN SYRINGE 1CC/29G (B-D INSULIN SYRINGE) 29G X 1/2" 1 ML MISC 4 (four) times daily. Dx 250.03      . isosorbide mononitrate (IMDUR) 60 MG 24 hr tablet Take 1 tablet (60 mg total) by mouth daily.  90 tablet  3  . Multiple Vitamins-Minerals (MULTIVITAMIN PO) Take 1 tablet by mouth daily.        . nitroGLYCERIN (NITROLINGUAL) 0.4 MG/SPRAY spray Place 1 spray under the tongue as directed.        Marland Kitchen omeprazole (PRILOSEC) 20 MG capsule TAKE ONE CAPSULE BY MOUTH DAILY  90 capsule  2  . polyethylene glycol powder (MIRALAX) powder Take 17 g by mouth as needed.        . Protein (UNJURY UNFLAVORED PO) Take 8 oz by mouth daily.        . rosuvastatin (CRESTOR) 10 MG tablet TAKE 1 TABLET BY MOUTH ONCE DAILY  90 tablet  3  . TAZTIA XT 180 MG 24 hr capsule TAKE ONE CAPSULE BY MOUTH DAILY  90 capsule  3  . tiotropium (SPIRIVA) 18 MCG inhalation capsule Place 18 mcg into inhaler and inhale daily.       . traZODone (DESYREL) 100 MG tablet TAKE 1 TABLET BY MOUTH EVERY NIGHT AT BEDTIME  90 tablet  3  . triamcinolone (KENALOG) 0.025 % cream APPLY THREE TIMES DAILY ON AFFECTED AREA AS NEEDED  80 g  1  . zoster vaccine live, PF, (ZOSTAVAX) 16109 UNT/0.65ML injection Inject 19,400 Units into the skin once.  1 each  0   No current  facility-administered medications for this visit.    Allergies  Allergen Reactions  . Enalapril Maleate  REACTION: cough  . Shellfish-Derived Products Swelling    History   Social History  . Marital Status: Married    Spouse Name: N/A    Number of Children: Y  . Years of Education: N/A   Occupational History  .  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     Social History Main Topics  . Smoking status: Former Smoker -- 2.00 packs/day for [name] years    Types: Cigarettes    Quit date: 12/24/1997  . Smokeless tobacco: Former Neurosurgeon  . Alcohol Use: No  . Drug Use: No  . Sexually Active: Not on file   Other Topics Concern  . Not on file   Social History Narrative  . No narrative on file    Family History  Problem Relation Age of Onset  . Hypertension      family history of htn  . Heart disease Maternal Aunt   . Lung cancer Mother   . Cancer Mother     Lung Cancer  . Heart disease Father     CHF    Review of Systems:  As stated in the HPI and otherwise negative.   BP 120/54  Pulse 70  Ht 5\' 3"  (1.6 m)  Wt 210 lb 12.8 oz (95.618 kg)  BMI 37.35 kg/m2  Physical Examination: General: Well developed, well nourished, NAD HEENT: OP clear, mucus membranes moist SKIN: warm, dry. No rashes. Neuro: No focal deficits Musculoskeletal: Muscle strength 5/5 all ext Psychiatric: Mood and affect normal Neck: No JVD, no carotid bruits, no thyromegaly, no lymphadenopathy. Lungs:Clear bilaterally, no wheezes, rhonci, crackles Cardiovascular: Regular rate and rhythm. No murmurs, gallops or rubs. Abdomen:Soft. Bowel sounds present. Non-tender.  Extremities: No lower extremity edema. Pulses are 2 + in the bilateral DP/PT.  Assessment and Plan:   1. CAD: Stable. No changes today. Continue current therapy. BP is controlled and lipids are at goal.       2. DIASTOLIC HEART FAILURE, CHRONIC: Follow weight. Continue Lasix as written. Alert [place] if his weight increases or he notices LE edema or SOB.

## 2013-03-19 NOTE — Patient Instructions (Addendum)
Your physician wants you to follow-up in:  6 months. You will receive a reminder letter in the mail two months in advance. If you don't receive a letter, please call our office to schedule the follow-up appointment.   

## 2013-03-20 DIAGNOSIS — D0460 Carcinoma in situ of skin of unspecified upper limb, including shoulder: Secondary | ICD-10-CM | POA: Diagnosis not present

## 2013-03-24 ENCOUNTER — Encounter (HOSPITAL_COMMUNITY)
Admission: RE | Admit: 2013-03-24 | Discharge: 2013-03-24 | Disposition: A | Discharge status: Home / Self Care | Payer: Self-pay | Source: Ambulatory Visit | Attending: Pulmonary Disease | Admitting: Pulmonary Disease

## 2013-03-24 DIAGNOSIS — I2510 Atherosclerotic heart disease of native coronary artery without angina pectoris: Secondary | ICD-10-CM | POA: Insufficient documentation

## 2013-03-24 DIAGNOSIS — I129 Hypertensive chronic kidney disease with stage 1 through stage 4 chronic kidney disease, or unspecified chronic kidney disease: Secondary | ICD-10-CM | POA: Insufficient documentation

## 2013-03-24 DIAGNOSIS — E119 Type 2 diabetes mellitus without complications: Secondary | ICD-10-CM | POA: Insufficient documentation

## 2013-03-24 DIAGNOSIS — Z5189 Encounter for other specified aftercare: Secondary | ICD-10-CM | POA: Insufficient documentation

## 2013-03-24 DIAGNOSIS — I739 Peripheral vascular disease, unspecified: Secondary | ICD-10-CM | POA: Insufficient documentation

## 2013-03-24 DIAGNOSIS — E785 Hyperlipidemia, unspecified: Secondary | ICD-10-CM | POA: Insufficient documentation

## 2013-03-24 DIAGNOSIS — J4489 Other specified chronic obstructive pulmonary disease: Secondary | ICD-10-CM | POA: Insufficient documentation

## 2013-03-24 DIAGNOSIS — N183 Chronic kidney disease, stage 3 unspecified: Secondary | ICD-10-CM | POA: Insufficient documentation

## 2013-03-24 DIAGNOSIS — J449 Chronic obstructive pulmonary disease, unspecified: Secondary | ICD-10-CM | POA: Insufficient documentation

## 2013-03-24 DIAGNOSIS — I10 Essential (primary) hypertension: Secondary | ICD-10-CM | POA: Insufficient documentation

## 2013-03-24 DIAGNOSIS — I5032 Chronic diastolic (congestive) heart failure: Secondary | ICD-10-CM | POA: Insufficient documentation

## 2013-03-25 ENCOUNTER — Ambulatory Visit (INDEPENDENT_AMBULATORY_CARE_PROVIDER_SITE_OTHER): Payer: Medicare Other | Admitting: Pulmonary Disease

## 2013-03-25 ENCOUNTER — Encounter: Payer: Self-pay | Admitting: Pulmonary Disease

## 2013-03-25 VITALS — BP 128/68 | HR 64 | Temp 97.8°F | Ht 63.0 in | Wt 211.8 lb

## 2013-03-25 DIAGNOSIS — J449 Chronic obstructive pulmonary disease, unspecified: Secondary | ICD-10-CM

## 2013-03-25 NOTE — Patient Instructions (Addendum)
No change in medications Continue to stay active, and work on weight loss followup with me in 6mos if doing well, but call if having issues.

## 2013-03-25 NOTE — Assessment & Plan Note (Signed)
The patient is doing fairly well from a pulmonary standpoint, and is staying compliant with his bronchodilators and also his pulmonary rehabilitation program.  He has had no recent acute exacerbation or pulmonary infection.  He is not over using his rescue inhaler.  I have asked him to continue on his current medications, and to work on his exercise program and weight reduction.

## 2013-03-25 NOTE — Progress Notes (Signed)
  Subjective:    Patient ID: [name]., male    DOB: April 20, 1941, 73 y.o.   MRN: [id number]  HPI Patient comes in today for followup of his known COPD.  He has been staying on his bronchodilator regimen, and continues to participate in an exercise program at pulmonary rehabilitation.  He denies any significant cough or mucus production, and feels that his exertional tolerance is at baseline.  He has not had an acute exacerbation of her pulmonary infection since the last visit.   Review of Systems  Constitutional: Negative for fever and unexpected weight change.  HENT: Positive for sinus pressure. Negative for ear pain, nosebleeds, congestion, sore throat, rhinorrhea, sneezing, trouble swallowing, dental problem and postnasal drip.   Eyes: Negative for redness and itching.  Respiratory: Positive for chest tightness and wheezing. Negative for cough and shortness of breath.   Cardiovascular: Positive for leg swelling. Negative for palpitations.  Gastrointestinal: Negative for nausea and vomiting.  Genitourinary: Negative for dysuria.  Musculoskeletal: Negative for joint swelling.  Skin: Negative for rash.  Neurological: Negative for headaches.  Hematological: Does not bruise/bleed easily.  Psychiatric/Behavioral: Negative for dysphoric mood. The patient is not nervous/anxious.        Objective:   Physical Exam Overweight male in no acute distress Nose without purulence or discharge noted Neck without lymphadenopathy or thyromegaly Chest with decreased breath sounds, few basilar crackles, no wheezing noted. Cardiac exam with regular rate and rhythm Lower extremities with mild edema, no cyanosis Alert and oriented, moves all 4 extremities.       Assessment & Plan:

## 2013-03-26 ENCOUNTER — Encounter (HOSPITAL_COMMUNITY)
Admission: RE | Admit: 2013-03-26 | Discharge: 2013-03-26 | Disposition: A | Discharge status: Home / Self Care | Payer: Self-pay | Source: Ambulatory Visit | Attending: Pulmonary Disease | Admitting: Pulmonary Disease

## 2013-03-31 ENCOUNTER — Encounter (HOSPITAL_COMMUNITY)
Admission: RE | Admit: 2013-03-31 | Discharge: 2013-03-31 | Disposition: A | Discharge status: Home / Self Care | Payer: Self-pay | Source: Ambulatory Visit | Attending: Pulmonary Disease | Admitting: Pulmonary Disease

## 2013-04-01 DIAGNOSIS — H25019 Cortical age-related cataract, unspecified eye: Secondary | ICD-10-CM | POA: Diagnosis not present

## 2013-04-01 DIAGNOSIS — H0230 Blepharochalasis unspecified eye, unspecified eyelid: Secondary | ICD-10-CM | POA: Diagnosis not present

## 2013-04-01 DIAGNOSIS — H25049 Posterior subcapsular polar age-related cataract, unspecified eye: Secondary | ICD-10-CM | POA: Diagnosis not present

## 2013-04-01 DIAGNOSIS — H2510 Age-related nuclear cataract, unspecified eye: Secondary | ICD-10-CM | POA: Diagnosis not present

## 2013-04-02 ENCOUNTER — Encounter (HOSPITAL_COMMUNITY)
Admission: RE | Admit: 2013-04-02 | Discharge: 2013-04-02 | Disposition: A | Discharge status: Home / Self Care | Payer: Self-pay | Source: Ambulatory Visit | Attending: Pulmonary Disease | Admitting: Pulmonary Disease

## 2013-04-07 ENCOUNTER — Encounter (HOSPITAL_COMMUNITY)
Admission: RE | Admit: 2013-04-07 | Discharge: 2013-04-07 | Disposition: A | Discharge status: Home / Self Care | Payer: Self-pay | Source: Ambulatory Visit | Attending: Pulmonary Disease | Admitting: Pulmonary Disease

## 2013-04-09 ENCOUNTER — Encounter (HOSPITAL_COMMUNITY)
Admission: RE | Admit: 2013-04-09 | Discharge: 2013-04-09 | Disposition: A | Discharge status: Home / Self Care | Payer: Self-pay | Source: Ambulatory Visit | Attending: Pulmonary Disease | Admitting: Pulmonary Disease

## 2013-04-14 ENCOUNTER — Other Ambulatory Visit: Payer: Self-pay | Admitting: Endocrinology

## 2013-04-14 ENCOUNTER — Encounter (HOSPITAL_COMMUNITY): Payer: Self-pay

## 2013-04-14 DIAGNOSIS — H2510 Age-related nuclear cataract, unspecified eye: Secondary | ICD-10-CM | POA: Diagnosis not present

## 2013-04-16 ENCOUNTER — Encounter (HOSPITAL_COMMUNITY): Payer: Self-pay

## 2013-04-16 DIAGNOSIS — H2510 Age-related nuclear cataract, unspecified eye: Secondary | ICD-10-CM | POA: Diagnosis not present

## 2013-04-16 DIAGNOSIS — H25049 Posterior subcapsular polar age-related cataract, unspecified eye: Secondary | ICD-10-CM | POA: Diagnosis not present

## 2013-04-21 ENCOUNTER — Encounter (HOSPITAL_COMMUNITY)
Admission: RE | Admit: 2013-04-21 | Discharge: 2013-04-21 | Disposition: A | Discharge status: Home / Self Care | Payer: Self-pay | Source: Ambulatory Visit | Attending: Pulmonary Disease | Admitting: Pulmonary Disease

## 2013-04-23 ENCOUNTER — Encounter (HOSPITAL_COMMUNITY)
Admission: RE | Admit: 2013-04-23 | Discharge: 2013-04-23 | Disposition: A | Discharge status: Home / Self Care | Payer: Self-pay | Source: Ambulatory Visit | Attending: Pulmonary Disease | Admitting: Pulmonary Disease

## 2013-04-23 DIAGNOSIS — E119 Type 2 diabetes mellitus without complications: Secondary | ICD-10-CM | POA: Insufficient documentation

## 2013-04-23 DIAGNOSIS — Z5189 Encounter for other specified aftercare: Secondary | ICD-10-CM | POA: Insufficient documentation

## 2013-04-23 DIAGNOSIS — I10 Essential (primary) hypertension: Secondary | ICD-10-CM | POA: Insufficient documentation

## 2013-04-23 DIAGNOSIS — N183 Chronic kidney disease, stage 3 unspecified: Secondary | ICD-10-CM | POA: Insufficient documentation

## 2013-04-23 DIAGNOSIS — E785 Hyperlipidemia, unspecified: Secondary | ICD-10-CM | POA: Insufficient documentation

## 2013-04-23 DIAGNOSIS — J449 Chronic obstructive pulmonary disease, unspecified: Secondary | ICD-10-CM | POA: Insufficient documentation

## 2013-04-23 DIAGNOSIS — I739 Peripheral vascular disease, unspecified: Secondary | ICD-10-CM | POA: Insufficient documentation

## 2013-04-23 DIAGNOSIS — I5032 Chronic diastolic (congestive) heart failure: Secondary | ICD-10-CM | POA: Insufficient documentation

## 2013-04-23 DIAGNOSIS — I2510 Atherosclerotic heart disease of native coronary artery without angina pectoris: Secondary | ICD-10-CM | POA: Insufficient documentation

## 2013-04-23 DIAGNOSIS — J4489 Other specified chronic obstructive pulmonary disease: Secondary | ICD-10-CM | POA: Insufficient documentation

## 2013-04-23 DIAGNOSIS — I129 Hypertensive chronic kidney disease with stage 1 through stage 4 chronic kidney disease, or unspecified chronic kidney disease: Secondary | ICD-10-CM | POA: Insufficient documentation

## 2013-04-24 ENCOUNTER — Other Ambulatory Visit: Payer: Self-pay | Admitting: Endocrinology

## 2013-04-24 ENCOUNTER — Other Ambulatory Visit: Payer: Self-pay | Admitting: *Deleted

## 2013-04-24 MED ORDER — FINASTERIDE 5 MG PO TABS: 5.0000 mg | ORAL_TABLET | Freq: Every day | ORAL | Status: DC

## 2013-04-28 ENCOUNTER — Encounter (HOSPITAL_COMMUNITY)
Admission: RE | Admit: 2013-04-28 | Discharge: 2013-04-28 | Disposition: A | Discharge status: Home / Self Care | Payer: Self-pay | Source: Ambulatory Visit | Attending: Pulmonary Disease | Admitting: Pulmonary Disease

## 2013-04-30 ENCOUNTER — Encounter (HOSPITAL_COMMUNITY)
Admission: RE | Admit: 2013-04-30 | Discharge: 2013-04-30 | Disposition: A | Discharge status: Home / Self Care | Payer: Self-pay | Source: Ambulatory Visit | Attending: Pulmonary Disease | Admitting: Pulmonary Disease

## 2013-05-05 ENCOUNTER — Encounter (HOSPITAL_COMMUNITY): Payer: Self-pay

## 2013-05-07 ENCOUNTER — Encounter (HOSPITAL_COMMUNITY): Payer: Self-pay

## 2013-05-11 DIAGNOSIS — H25049 Posterior subcapsular polar age-related cataract, unspecified eye: Secondary | ICD-10-CM | POA: Diagnosis not present

## 2013-05-11 DIAGNOSIS — H25019 Cortical age-related cataract, unspecified eye: Secondary | ICD-10-CM | POA: Diagnosis not present

## 2013-05-11 DIAGNOSIS — H2510 Age-related nuclear cataract, unspecified eye: Secondary | ICD-10-CM | POA: Diagnosis not present

## 2013-05-11 DIAGNOSIS — H2181 Floppy iris syndrome: Secondary | ICD-10-CM | POA: Diagnosis not present

## 2013-05-12 ENCOUNTER — Encounter (HOSPITAL_COMMUNITY)
Admission: RE | Admit: 2013-05-12 | Discharge: 2013-05-12 | Disposition: A | Discharge status: Home / Self Care | Payer: Self-pay | Source: Ambulatory Visit | Attending: Pulmonary Disease | Admitting: Pulmonary Disease

## 2013-05-14 ENCOUNTER — Encounter (HOSPITAL_COMMUNITY)
Admission: RE | Admit: 2013-05-14 | Discharge: 2013-05-14 | Disposition: A | Discharge status: Home / Self Care | Payer: Self-pay | Source: Ambulatory Visit | Attending: Pulmonary Disease | Admitting: Pulmonary Disease

## 2013-05-19 ENCOUNTER — Encounter (HOSPITAL_COMMUNITY)
Admission: RE | Admit: 2013-05-19 | Discharge: 2013-05-19 | Disposition: A | Discharge status: Home / Self Care | Payer: Self-pay | Source: Ambulatory Visit | Attending: Pulmonary Disease | Admitting: Pulmonary Disease

## 2013-05-20 ENCOUNTER — Other Ambulatory Visit: Payer: Self-pay

## 2013-05-20 MED ORDER — OMEPRAZOLE 20 MG PO CPDR: DELAYED_RELEASE_CAPSULE | ORAL | Status: DC

## 2013-05-21 ENCOUNTER — Encounter (HOSPITAL_COMMUNITY)
Admission: RE | Admit: 2013-05-21 | Discharge: 2013-05-21 | Disposition: A | Discharge status: Home / Self Care | Payer: Self-pay | Source: Ambulatory Visit | Attending: Pulmonary Disease | Admitting: Pulmonary Disease

## 2013-05-26 ENCOUNTER — Encounter (HOSPITAL_COMMUNITY)
Admission: RE | Admit: 2013-05-26 | Discharge: 2013-05-26 | Disposition: A | Discharge status: Home / Self Care | Payer: Self-pay | Source: Ambulatory Visit | Attending: Pulmonary Disease | Admitting: Pulmonary Disease

## 2013-05-26 ENCOUNTER — Ambulatory Visit (INDEPENDENT_AMBULATORY_CARE_PROVIDER_SITE_OTHER): Payer: Medicare Other | Admitting: Endocrinology

## 2013-05-26 ENCOUNTER — Encounter: Payer: Self-pay | Admitting: Endocrinology

## 2013-05-26 VITALS — BP 128/74 | HR 62 | Ht 63.0 in | Wt 210.0 lb

## 2013-05-26 DIAGNOSIS — E119 Type 2 diabetes mellitus without complications: Secondary | ICD-10-CM | POA: Insufficient documentation

## 2013-05-26 DIAGNOSIS — J449 Chronic obstructive pulmonary disease, unspecified: Secondary | ICD-10-CM | POA: Insufficient documentation

## 2013-05-26 DIAGNOSIS — E1029 Type 1 diabetes mellitus with other diabetic kidney complication: Secondary | ICD-10-CM | POA: Diagnosis not present

## 2013-05-26 DIAGNOSIS — I129 Hypertensive chronic kidney disease with stage 1 through stage 4 chronic kidney disease, or unspecified chronic kidney disease: Secondary | ICD-10-CM | POA: Insufficient documentation

## 2013-05-26 DIAGNOSIS — Z5189 Encounter for other specified aftercare: Secondary | ICD-10-CM | POA: Insufficient documentation

## 2013-05-26 DIAGNOSIS — I2510 Atherosclerotic heart disease of native coronary artery without angina pectoris: Secondary | ICD-10-CM | POA: Insufficient documentation

## 2013-05-26 DIAGNOSIS — E785 Hyperlipidemia, unspecified: Secondary | ICD-10-CM | POA: Insufficient documentation

## 2013-05-26 DIAGNOSIS — I5032 Chronic diastolic (congestive) heart failure: Secondary | ICD-10-CM | POA: Insufficient documentation

## 2013-05-26 DIAGNOSIS — I10 Essential (primary) hypertension: Secondary | ICD-10-CM | POA: Insufficient documentation

## 2013-05-26 DIAGNOSIS — N183 Chronic kidney disease, stage 3 unspecified: Secondary | ICD-10-CM | POA: Insufficient documentation

## 2013-05-26 DIAGNOSIS — J4489 Other specified chronic obstructive pulmonary disease: Secondary | ICD-10-CM | POA: Insufficient documentation

## 2013-05-26 DIAGNOSIS — I739 Peripheral vascular disease, unspecified: Secondary | ICD-10-CM | POA: Insufficient documentation

## 2013-05-26 MED ORDER — NATEGLINIDE 120 MG PO TABS: 120.0000 mg | ORAL_TABLET | Freq: Three times a day (TID) | ORAL | Status: DC

## 2013-05-26 NOTE — Patient Instructions (Addendum)
check your blood sugar 2 times a day.  vary the time of day when you check, between before the 3 meals, and at bedtime.  also check if you have symptoms of your blood sugar being too high or too low.  please keep a record of the readings and bring it to your next appointment here.  please call us sooner if you are having low blood sugar episodes, or if it stays over 200.   A diabetes blood test is requested for you today.  We'll contact you with results. Please change the insulin to "nateglinide."  i have sent a prescription to your pharmacy.  Please come back for a follow-up appointment in 3 months.

## 2013-05-26 NOTE — Progress Notes (Signed)
Subjective:    Patient ID: [name]., male    DOB: 1941/04/15, 72 y.o.   MRN: [id number]  HPI Pt returns for f/u of insulin-requiring DM (dx'ed 1991; he has been on insulin since 2005; he has mild if any neuropathy of the lower extremities; she has associated renal insufficiency, CAD and PAD; he underwent lap-band placement in June 2012; he can't take metformin due to renal insuff, and he can't take actos due to edema).  He has lost 34 lbs since the surgery.  He takes acarbose, and a low dosage of novolog.   Past Medical History  Diagnosis Date  . Allergic rhinitis   . COPD (chronic obstructive pulmonary disease)     mild to moderate by pfts in 2006  . HLD (hyperlipidemia)   . Impotence   . PVD (peripheral vascular disease)   . Type II or unspecified type diabetes mellitus with renal manifestations, not stated as uncontrolled(250.40)   . Renal insufficiency   . Osteoarthritis   . Cough     due to Zestril  . Encounter for long-term (current) use of aspirin   . Encounter for long-term (current) use of antiplatelets/antithrombotics   . Essential hypertension, benign   . Chronic diastolic heart failure   . Coronary atherosclerosis of native coronary artery   . Shortness of breath   . Other emphysema   . Edema   . Special screening for malignant neoplasm of prostate   . Conjunctivitis unspecified   . Hypopotassemia   . Osteoporosis, unspecified   . Secondary hyperparathyroidism (of renal origin)   . Gout, unspecified   . Pneumonia, organism unspecified   . 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 Hemiplegia affecting unspecified side, late effect of cerebrovascular disease   . Headache(784.0)   . Other and unspecified hyperlipidemia   . Nephropathy, diabetic   . Cancer 04/01/12    skin x3  . CHF (congestive heart failure) previous hx  . Stroke 04-01-12    2007, mild   left arm weakness    Past Surgical  History  Procedure Laterality Date  . Nasal sinus surgery    . Esophagogastroduodenoscopy    . Cardiac catheterization  01/18/2005  . Abdominal surgery    . Laparoscopic gastric banding  05/29/2011    History   Social History  . Marital Status: Married    Spouse Name: N/A    Number of Children: Y  . Years of Education: N/A   Occupational History  .  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     Social History Main Topics  . Smoking status: Former Smoker -- 2.00 packs/day for 41 years    Types: Cigarettes    Quit date: 12/24/1997  . Smokeless tobacco: Former Neurosurgeon  . Alcohol Use: No  . Drug Use: No  . Sexually Active: Not on file   Other Topics Concern  . Not on file   Social History Narrative  . No narrative on file    Current Outpatient Prescriptions on File Prior to Visit  Medication Sig Dispense Refill  . acarbose (PRECOSE) 25 MG tablet Take 1 tablet (25 mg total) by mouth 3 (three) times daily with meals.  270 tablet  11  . albuterol (VENTOLIN HFA) 108 (90 BASE) MCG/ACT inhaler Inhale 2 puffs into the lungs every 6 (six) hours as needed.        Marland Kitchen allopurinol (ZYLOPRIM) 300 MG tablet TAKE 1  TABLET BY MOUTH DAILY  30 tablet  11  . aspirin 81 MG tablet Take 81 mg by mouth daily.        . Azelastine HCl (ASTEPRO) 0.15 % SOLN INSTILL 2 SPRAYS INTO EACH NOSTRIL EVERY MORNING AS NEEDED  30 mL  1  . BESIVANCE 0.6 % SUSP       . bisoprolol (ZEBETA) 5 MG tablet Take 1 tablet (5 mg total) by mouth daily.  90 tablet  1  . budesonide-formoterol (SYMBICORT) 160-4.5 MCG/ACT inhaler Inhale 2 puffs into the lungs 2 (two) times daily.        . calcitRIOL (ROCALTROL) 0.25 MCG capsule TAKE ONE CAPSULE BY MOUTH DAILY  90 capsule  0  . citalopram (CELEXA) 20 MG tablet Take 1 tablet (20 mg total) by mouth daily.  30 tablet  6  . DUREZOL 0.05 % EMUL       . ferrous sulfate 325 (65 FE) MG tablet Take 325 mg by mouth daily with breakfast.       . finasteride (PROSCAR) 5 MG tablet Take 1 tablet (5 mg total) by  mouth daily.  30 tablet  4  . FOLBIC 2.5-25-2 MG TABS TAKE 1 TABLET BY MOUTH EVERY DAY  90 tablet  2  . furosemide (LASIX) 40 MG tablet Take 1 tablet (40 mg total) by mouth as directed. 80 MG IN THE AM AND 120 MG IN THE PM  150 tablet  6  . glucose blood test strip Four times a day, variable glucoses dx 250.03      . HYDROcodone-acetaminophen (NORCO) 10-325 MG per tablet Take 1 tablet by mouth every 6 (six) hours as needed for pain.  100 tablet  5  . ILEVRO 0.3 % SUSP       . isosorbide mononitrate (IMDUR) 60 MG 24 hr tablet Take 1 tablet (60 mg total) by mouth daily.  90 tablet  3  . Multiple Vitamins-Minerals (MULTIVITAMIN PO) Take 1 tablet by mouth daily.        . nitroGLYCERIN (NITROLINGUAL) 0.4 MG/SPRAY spray Place 1 spray under the tongue as directed.        Marland Kitchen omeprazole (PRILOSEC) 20 MG capsule TAKE ONE CAPSULE BY MOUTH DAILY  90 capsule  2  . polyethylene glycol powder (MIRALAX) powder Take 17 g by mouth as needed.        . Protein (UNJURY UNFLAVORED PO) Take 8 oz by mouth daily.        . rosuvastatin (CRESTOR) 10 MG tablet TAKE 1 TABLET BY MOUTH ONCE DAILY  90 tablet  3  . TAZTIA XT 180 MG 24 hr capsule TAKE ONE CAPSULE BY MOUTH DAILY  90 capsule  3  . tiotropium (SPIRIVA) 18 MCG inhalation capsule Place 18 mcg into inhaler and inhale daily.       . traZODone (DESYREL) 100 MG tablet TAKE 1 TABLET BY MOUTH EVERY NIGHT AT BEDTIME  90 tablet  3  . triamcinolone (KENALOG) 0.025 % cream APPLY THREE TIMES DAILY ON AFFECTED AREA AS NEEDED  80 g  1  . zoster vaccine live, PF, (ZOSTAVAX) 16109 UNT/0.65ML injection Inject 19,400 Units into the skin once.  1 each  0   No current facility-administered medications on file prior to visit.    Allergies  Allergen Reactions  . Enalapril Maleate     REACTION: cough  . Shellfish-Derived Products Swelling    Family History  Problem Relation Age of Onset  . Hypertension  family history of htn  . Heart disease Maternal Aunt   . Lung cancer  Mother   . Cancer Mother     Lung Cancer  . Heart disease Father     CHF    BP 128/74  Pulse 62  Ht 5\' 3"  (1.6 m)  Wt 210 lb (95.255 kg)  BMI 37.21 kg/m2  SpO2 96%  Review of Systems denies hypoglycemia.  He has intermittent mild hypoglycemia in teh afternoon.      Objective:   Physical Exam VITAL SIGNS:  See vs page GENERAL: no distress  Lab Results  Component Value Date   HGBA1C 7.0* 05/26/2013      Assessment & Plan:  DM: he can go off insulin now, as he is losing weight.  We discussed the nine oral agents available for type 2 diabetes.  This regimen gives the best risk-benefit ratio.

## 2013-05-28 ENCOUNTER — Encounter (HOSPITAL_COMMUNITY)
Admission: RE | Admit: 2013-05-28 | Discharge: 2013-05-28 | Disposition: A | Discharge status: Home / Self Care | Payer: Self-pay | Source: Ambulatory Visit | Attending: Pulmonary Disease | Admitting: Pulmonary Disease

## 2013-06-02 ENCOUNTER — Encounter (HOSPITAL_COMMUNITY)
Admission: RE | Admit: 2013-06-02 | Discharge: 2013-06-02 | Disposition: A | Discharge status: Home / Self Care | Payer: Self-pay | Source: Ambulatory Visit | Attending: Pulmonary Disease | Admitting: Pulmonary Disease

## 2013-06-04 ENCOUNTER — Encounter (HOSPITAL_COMMUNITY)
Admission: RE | Admit: 2013-06-04 | Discharge: 2013-06-04 | Disposition: A | Discharge status: Home / Self Care | Payer: Self-pay | Source: Ambulatory Visit | Attending: Pulmonary Disease | Admitting: Pulmonary Disease

## 2013-06-08 ENCOUNTER — Ambulatory Visit (INDEPENDENT_AMBULATORY_CARE_PROVIDER_SITE_OTHER): Payer: Medicare Other | Admitting: Endocrinology

## 2013-06-08 ENCOUNTER — Encounter: Payer: Self-pay | Admitting: Endocrinology

## 2013-06-08 VITALS — BP 122/78 | HR 80 | Ht 63.0 in | Wt 204.0 lb

## 2013-06-08 DIAGNOSIS — E1029 Type 1 diabetes mellitus with other diabetic kidney complication: Secondary | ICD-10-CM | POA: Diagnosis not present

## 2013-06-08 DIAGNOSIS — E119 Type 2 diabetes mellitus without complications: Secondary | ICD-10-CM | POA: Diagnosis not present

## 2013-06-08 MED ORDER — NEOMYCIN-POLYMYXIN-HC 3.5-10000-1 OT SUSP: 3.0000 [drp] | Freq: Four times a day (QID) | OTIC | Status: DC

## 2013-06-08 NOTE — Patient Instructions (Addendum)
check your blood sugar 2 times a day.  vary the time of day when you check, between before the 3 meals, and at bedtime.  also check if you have symptoms of your blood sugar being too high or too low.  please keep a record of the readings and bring it to your next appointment here.  please call us sooner if you are having low blood sugar episodes, or if it stays over 200.   Please resume the novolog, 10 units 3 times a day (just before each meal).    Please stop the nateglinide, and continue the acarbose.   Please come back for a follow-up appointment in 2 months.   i have sent a prescription to your pharmacy, for ear drops.

## 2013-06-08 NOTE — Progress Notes (Signed)
Subjective:    Patient ID: [name]., male    DOB: Jan 15, 1941, 72 y.o.   MRN: [id number]  HPI Pt returns for f/u of insulin-requiring DM (dx'ed 1991; he has been on insulin since 2005; he has mild if any neuropathy of the lower extremities; she has associated renal insufficiency, CAD and PAD; he underwent lap-band placement in June 2012; he can't take metformin due to renal insuff, and he can't take actos due to edema; he says starlix causes heartburn sxs). Pt states few days of slight left otalgia, and assoc congestion.  he brings a record of his cbg's which i have reviewed today.  It varies from 190-300.  There is no trend throughout the day.   Past Medical History  Diagnosis Date  . Allergic rhinitis   . COPD (chronic obstructive pulmonary disease)     mild to moderate by pfts in 2006  . HLD (hyperlipidemia)   . Impotence   . PVD (peripheral vascular disease)   . Type II or unspecified type diabetes mellitus with renal manifestations, not stated as uncontrolled(250.40)   . Renal insufficiency   . Osteoarthritis   . Cough     due to Zestril  . Encounter for long-term (current) use of aspirin   . Encounter for long-term (current) use of antiplatelets/antithrombotics   . Essential hypertension, benign   . Chronic diastolic heart failure   . Coronary atherosclerosis of native coronary artery   . Shortness of breath   . Other emphysema   . Edema   . Special screening for malignant neoplasm of prostate   . Conjunctivitis unspecified   . Hypopotassemia   . Osteoporosis, unspecified   . Secondary hyperparathyroidism (of renal origin)   . Gout, unspecified   . Pneumonia, organism unspecified   . 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 Hemiplegia affecting unspecified side, late effect of cerebrovascular disease   . Headache(784.0)   . Other and unspecified hyperlipidemia   . Nephropathy, diabetic   . Cancer  04/01/12    skin x3  . CHF (congestive heart failure) previous hx  . Stroke 04-01-12    2007, mild   left arm weakness    Past Surgical History  Procedure Laterality Date  . Nasal sinus surgery    . Esophagogastroduodenoscopy    . Cardiac catheterization  01/18/2005  . Abdominal surgery    . Laparoscopic gastric banding  05/29/2011    History   Social History  . Marital Status: Married    Spouse Name: N/A    Number of Children: Y  . Years of Education: N/A   Occupational History  .  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     Social History Main Topics  . Smoking status: Former Smoker -- 2.00 packs/day for 41 years    Types: Cigarettes    Quit date: 12/24/1997  . Smokeless tobacco: Former Neurosurgeon  . Alcohol Use: No  . Drug Use: No  . Sexually Active: Not on file   Other Topics Concern  . Not on file   Social History Narrative  . No narrative on file    Current Outpatient Prescriptions on File Prior to Visit  Medication Sig Dispense Refill  . albuterol (VENTOLIN HFA) 108 (90 BASE) MCG/ACT inhaler Inhale 2 puffs into the lungs every 6 (six) hours as needed.        Marland Kitchen allopurinol (ZYLOPRIM) 300 MG tablet TAKE 1 TABLET BY  MOUTH DAILY  30 tablet  11  . aspirin 81 MG tablet Take 81 mg by mouth daily.        . Azelastine HCl (ASTEPRO) 0.15 % SOLN INSTILL 2 SPRAYS INTO EACH NOSTRIL EVERY MORNING AS NEEDED  30 mL  1  . BESIVANCE 0.6 % SUSP       . bisoprolol (ZEBETA) 5 MG tablet Take 1 tablet (5 mg total) by mouth daily.  90 tablet  1  . budesonide-formoterol (SYMBICORT) 160-4.5 MCG/ACT inhaler Inhale 2 puffs into the lungs 2 (two) times daily.        . calcitRIOL (ROCALTROL) 0.25 MCG capsule TAKE ONE CAPSULE BY MOUTH DAILY  90 capsule  0  . citalopram (CELEXA) 20 MG tablet Take 1 tablet (20 mg total) by mouth daily.  30 tablet  6  . DUREZOL 0.05 % EMUL       . ferrous sulfate 325 (65 FE) MG tablet Take 325 mg by mouth daily with breakfast.       . finasteride (PROSCAR) 5 MG tablet Take 1 tablet (5  mg total) by mouth daily.  30 tablet  4  . FOLBIC 2.5-25-2 MG TABS TAKE 1 TABLET BY MOUTH EVERY DAY  90 tablet  2  . furosemide (LASIX) 40 MG tablet Take 1 tablet (40 mg total) by mouth as directed. 80 MG IN THE AM AND 120 MG IN THE PM  150 tablet  6  . glucose blood test strip Four times a day, variable glucoses dx 250.03      . HYDROcodone-acetaminophen (NORCO) 10-325 MG per tablet Take 1 tablet by mouth every 6 (six) hours as needed for pain.  100 tablet  5  . ILEVRO 0.3 % SUSP       . isosorbide mononitrate (IMDUR) 60 MG 24 hr tablet Take 1 tablet (60 mg total) by mouth daily.  90 tablet  3  . Multiple Vitamins-Minerals (MULTIVITAMIN PO) Take 1 tablet by mouth daily.        . nitroGLYCERIN (NITROLINGUAL) 0.4 MG/SPRAY spray Place 1 spray under the tongue as directed.        Marland Kitchen omeprazole (PRILOSEC) 20 MG capsule TAKE ONE CAPSULE BY MOUTH DAILY  90 capsule  2  . polyethylene glycol powder (MIRALAX) powder Take 17 g by mouth as needed.        . Protein (UNJURY UNFLAVORED PO) Take 8 oz by mouth daily.        . rosuvastatin (CRESTOR) 10 MG tablet TAKE 1 TABLET BY MOUTH ONCE DAILY  90 tablet  3  . TAZTIA XT 180 MG 24 hr capsule TAKE ONE CAPSULE BY MOUTH DAILY  90 capsule  3  . tiotropium (SPIRIVA) 18 MCG inhalation capsule Place 18 mcg into inhaler and inhale daily.       . traZODone (DESYREL) 100 MG tablet TAKE 1 TABLET BY MOUTH EVERY NIGHT AT BEDTIME  90 tablet  3  . triamcinolone (KENALOG) 0.025 % cream APPLY THREE TIMES DAILY ON AFFECTED AREA AS NEEDED  80 g  1  . zoster vaccine live, PF, (ZOSTAVAX) 16109 UNT/0.65ML injection Inject 19,400 Units into the skin once.  1 each  0   No current facility-administered medications on file prior to visit.    Allergies  Allergen Reactions  . Enalapril Maleate     REACTION: cough  . Shellfish-Derived Products Swelling    Family History  Problem Relation Age of Onset  . Hypertension      family history  of htn  . Heart disease Maternal Aunt   .  Lung cancer Mother   . Cancer Mother     Lung Cancer  . Heart disease Father     CHF    BP 122/78  Pulse 80  Ht 5\' 3"  (1.6 m)  Wt 204 lb (92.534 kg)  BMI 36.15 kg/m2  SpO2 98%  Review of Systems Denies fever and hypoglycemia.      Objective:   Physical Exam VITAL SIGNS:  See vs page GENERAL: no distress Left eac: red and swollen.  TM is normal     Assessment & Plan:  DM: he needs to resume insulin; this insulin regimen was chosen from multiple options, as it best matches his insulin to his changing requirements throughout the day.  The benefits of glycemic control must be weighed against the risks of hypoglycemia.   Heartburn, due to starlix.

## 2013-06-09 ENCOUNTER — Encounter (HOSPITAL_COMMUNITY)
Admission: RE | Admit: 2013-06-09 | Discharge: 2013-06-09 | Disposition: A | Discharge status: Home / Self Care | Payer: Self-pay | Source: Ambulatory Visit | Attending: Pulmonary Disease | Admitting: Pulmonary Disease

## 2013-06-10 ENCOUNTER — Other Ambulatory Visit: Payer: Self-pay | Admitting: Endocrinology

## 2013-06-10 ENCOUNTER — Other Ambulatory Visit: Payer: Self-pay | Admitting: *Deleted

## 2013-06-10 MED ORDER — BISOPROLOL FUMARATE 5 MG PO TABS: 5.0000 mg | ORAL_TABLET | Freq: Every day | ORAL | Status: DC

## 2013-06-11 ENCOUNTER — Encounter (HOSPITAL_COMMUNITY)
Admission: RE | Admit: 2013-06-11 | Discharge: 2013-06-11 | Disposition: A | Discharge status: Home / Self Care | Payer: Self-pay | Source: Ambulatory Visit | Attending: Pulmonary Disease | Admitting: Pulmonary Disease

## 2013-06-11 ENCOUNTER — Encounter (INDEPENDENT_AMBULATORY_CARE_PROVIDER_SITE_OTHER): Payer: Self-pay

## 2013-06-11 ENCOUNTER — Ambulatory Visit (INDEPENDENT_AMBULATORY_CARE_PROVIDER_SITE_OTHER): Payer: Medicare Other | Admitting: Physician Assistant

## 2013-06-11 DIAGNOSIS — Z9884 Bariatric surgery status: Secondary | ICD-10-CM

## 2013-06-11 NOTE — Patient Instructions (Signed)
Return in three months. Focus on good food choices as well as physical activity. Return sooner if you have an increase in hunger, portion sizes or weight. Return also for difficulty swallowing, night cough, reflux.   

## 2013-06-11 NOTE — Progress Notes (Signed)
  HISTORY: [name]. is a 72 y.o.male who received an AP-Large lap-band in June 2012 by Dr. Ezzard Standing. He comes in with 7 lbs weight loss since his last visit in March. He had a change in is diabetes medication which he said caused persistent nausea and vomiting. Discontinuing the medication and resuming insulin resolved his symptoms and helped his glucose control. He describes good control of hunger and small portion sizes with rare episodes of regurgitation. He doesn't feel strongly about a fill today.  VITAL SIGNS: Filed Vitals:   06/11/13 1118  BP: 128/66  Pulse: 72  Temp: 97.8 F (36.6 C)  Resp: 16    PHYSICAL EXAM: Physical exam reveals a very well-appearing 72 y.o.male in no apparent distress Neurologic: Awake, alert, oriented Psych: Bright affect, conversant Respiratory: Breathing even and unlabored. No stridor or wheezing Extremities: Atraumatic, good range of motion. Skin: Warm, Dry, no rashes Musculoskeletal: Normal gait, Joints normal  ASSESMENT: 72 y.o.  male  s/p AP-Large lap-band.   PLAN: It sounds like he's well in the green zone with good weight loss, good satiety and small portion sizes. We'll have him continue his current regimen and return to see [place] in three months. I asked him to return sooner if he notices an uptick in appetite, portion sizes or weight. He voiced understanding and agreement.

## 2013-06-12 ENCOUNTER — Other Ambulatory Visit: Payer: Self-pay | Admitting: Endocrinology

## 2013-06-15 ENCOUNTER — Other Ambulatory Visit: Payer: Self-pay | Admitting: *Deleted

## 2013-06-15 MED ORDER — FA-PYRIDOXINE-CYANOCOBALAMIN 2.5-25-2 MG PO TABS: 1.0000 | ORAL_TABLET | Freq: Every day | ORAL | Status: DC

## 2013-06-16 ENCOUNTER — Encounter (HOSPITAL_COMMUNITY)
Admission: RE | Admit: 2013-06-16 | Discharge: 2013-06-16 | Disposition: A | Discharge status: Home / Self Care | Payer: Self-pay | Source: Ambulatory Visit | Attending: Pulmonary Disease | Admitting: Pulmonary Disease

## 2013-06-18 ENCOUNTER — Encounter (HOSPITAL_COMMUNITY): Payer: Self-pay

## 2013-06-23 ENCOUNTER — Other Ambulatory Visit: Payer: Self-pay | Admitting: Endocrinology

## 2013-06-23 ENCOUNTER — Encounter (HOSPITAL_COMMUNITY)
Admission: RE | Admit: 2013-06-23 | Discharge: 2013-06-23 | Disposition: A | Discharge status: Home / Self Care | Payer: Self-pay | Source: Ambulatory Visit | Attending: Pulmonary Disease | Admitting: Pulmonary Disease

## 2013-06-23 DIAGNOSIS — Z5189 Encounter for other specified aftercare: Secondary | ICD-10-CM | POA: Insufficient documentation

## 2013-06-23 DIAGNOSIS — I2510 Atherosclerotic heart disease of native coronary artery without angina pectoris: Secondary | ICD-10-CM | POA: Insufficient documentation

## 2013-06-23 DIAGNOSIS — I739 Peripheral vascular disease, unspecified: Secondary | ICD-10-CM | POA: Insufficient documentation

## 2013-06-23 DIAGNOSIS — J449 Chronic obstructive pulmonary disease, unspecified: Secondary | ICD-10-CM | POA: Insufficient documentation

## 2013-06-23 DIAGNOSIS — I129 Hypertensive chronic kidney disease with stage 1 through stage 4 chronic kidney disease, or unspecified chronic kidney disease: Secondary | ICD-10-CM | POA: Insufficient documentation

## 2013-06-23 DIAGNOSIS — N183 Chronic kidney disease, stage 3 unspecified: Secondary | ICD-10-CM | POA: Insufficient documentation

## 2013-06-23 DIAGNOSIS — I5032 Chronic diastolic (congestive) heart failure: Secondary | ICD-10-CM | POA: Insufficient documentation

## 2013-06-23 DIAGNOSIS — E119 Type 2 diabetes mellitus without complications: Secondary | ICD-10-CM | POA: Insufficient documentation

## 2013-06-23 DIAGNOSIS — E785 Hyperlipidemia, unspecified: Secondary | ICD-10-CM | POA: Insufficient documentation

## 2013-06-23 DIAGNOSIS — I10 Essential (primary) hypertension: Secondary | ICD-10-CM | POA: Insufficient documentation

## 2013-06-23 DIAGNOSIS — J4489 Other specified chronic obstructive pulmonary disease: Secondary | ICD-10-CM | POA: Insufficient documentation

## 2013-06-25 ENCOUNTER — Encounter (HOSPITAL_COMMUNITY)
Admission: RE | Admit: 2013-06-25 | Discharge: 2013-06-25 | Disposition: A | Discharge status: Home / Self Care | Payer: Self-pay | Source: Ambulatory Visit | Attending: Pulmonary Disease | Admitting: Pulmonary Disease

## 2013-06-29 ENCOUNTER — Encounter: Payer: Self-pay | Admitting: Pulmonary Disease

## 2013-06-29 ENCOUNTER — Ambulatory Visit (INDEPENDENT_AMBULATORY_CARE_PROVIDER_SITE_OTHER): Payer: Medicare Other | Admitting: Pulmonary Disease

## 2013-06-29 VITALS — BP 102/52 | HR 67 | Temp 99.3°F | Ht 63.5 in | Wt 209.6 lb

## 2013-06-29 DIAGNOSIS — J449 Chronic obstructive pulmonary disease, unspecified: Secondary | ICD-10-CM

## 2013-06-29 DIAGNOSIS — J019 Acute sinusitis, unspecified: Secondary | ICD-10-CM

## 2013-06-29 MED ORDER — AMOXICILLIN-POT CLAVULANATE 875-125 MG PO TABS: 1.0000 | ORAL_TABLET | Freq: Two times a day (BID) | ORAL | Status: DC

## 2013-06-29 MED ORDER — PREDNISONE 10 MG PO TABS: ORAL_TABLET | ORAL | Status: DC

## 2013-06-29 NOTE — Assessment & Plan Note (Signed)
The patient's history is most consistent with acute sinusitis.  We'll treat him with a short course of antibiotics, prednisone taper, and also reviewed aggressive nasal hygiene with him.

## 2013-06-29 NOTE — Assessment & Plan Note (Signed)
The patient has some increased shortness of breath, but there is nothing by history or exam to suggest a COPD exacerbation.  I will treat him with a short course of prednisone to help with nasal mucosal inflammation and swelling.

## 2013-06-29 NOTE — Patient Instructions (Addendum)
Use astelin 2 sprays each nostril twice a day until better, then can decrease to once a day.  Use after sinus rinses.  Use neilmed sinus rinses am and pm until better, then can discontinue and use as needed.  Can get powder at drugstore Will treat with augmentin 875mg  one am and pm for 7 days Will treat with a course of prednisone as directed. Please call if you are not improving.

## 2013-06-29 NOTE — Progress Notes (Signed)
  Subjective:    Patient ID: [name]., male    DOB: 1941-10-26, 72 y.o.   MRN: [id number]  HPI The patient comes in today for an acute sick visit.  I usually follow him for significant COPD, and now he gives a history of worsening nasal congestion with sinus pressure, pressure in his ears, as well as increasing cough with green mucus.  He also has increased shortness of breath, but does not feel that he is getting a chest cold at this time.  He denies any fevers, chills, or sweats.   Review of Systems  Constitutional: Positive for fatigue. Negative for fever and unexpected weight change.  HENT: Positive for ear pain, congestion, sore throat, rhinorrhea, postnasal drip and sinus pressure. Negative for nosebleeds, sneezing, trouble swallowing and dental problem.   Eyes: Negative for redness and itching.  Respiratory: Positive for shortness of breath and wheezing. Negative for cough and chest tightness.   Cardiovascular: Negative for palpitations and leg swelling.  Gastrointestinal: Negative for nausea and vomiting.  Genitourinary: Negative for dysuria.  Musculoskeletal: Negative for joint swelling.  Skin: Negative for rash.  Neurological: Positive for headaches.  Hematological: Does not bruise/bleed easily.  Psychiatric/Behavioral: Negative for dysphoric mood. The patient is not nervous/anxious.        Objective:   Physical Exam Obese male in no acute distress Nose without purulence or discharge noted, mild inflammatory change is seen Oropharynx clear Neck without lymphadenopathy or thyromegaly Chest with decreased breath sounds, no active wheezing Cardiac exam with regular rate and rhythm Lower extremities with 1-2+ edema bilaterally, no cyanosis Alert and oriented, moves all 4 extremities.       Assessment & Plan:

## 2013-06-30 ENCOUNTER — Encounter (HOSPITAL_COMMUNITY): Payer: Self-pay

## 2013-07-02 ENCOUNTER — Encounter (HOSPITAL_COMMUNITY): Payer: Self-pay

## 2013-07-02 ENCOUNTER — Telehealth: Payer: Self-pay | Admitting: Pulmonary Disease

## 2013-07-02 NOTE — Telephone Encounter (Signed)
If patient feels all right, then the prednisone probably did its job the way he took it. Recommend he finish antibiotic as directed and see how he does.

## 2013-07-02 NOTE — Telephone Encounter (Signed)
[facility] COPD patient, last OV 06-29-13. I spoke with the pt and he states he is out of prednisone and he thinks he took the medication wrong. Pt states he took 4 tablets twice daily instead of once daily for 2 days, and continued this pattern each day, such as the next day he took 3 tablets twice that day, then 2 tablets twice the next day. Pt last dose was last night and he took 20mg . So pt took 8 day pred course in 4 days. Pt still is on antibiotic course. Pt states he feels ok. According to pt chart he does have diabetes. Please advise. [name], CMA Allergies  Allergen Reactions  . Enalapril Maleate     REACTION: cough  . Shellfish-Derived Products Swelling

## 2013-07-02 NOTE — Telephone Encounter (Signed)
Spoke to pt. He states that he is feeling okay. Does have some dyspnea today but that's not out of the ordinary. He will finish the abx and call us back if he doesn't improve.

## 2013-07-07 ENCOUNTER — Encounter (HOSPITAL_COMMUNITY)
Admission: RE | Admit: 2013-07-07 | Discharge: 2013-07-07 | Disposition: A | Discharge status: Home / Self Care | Payer: Self-pay | Source: Ambulatory Visit | Attending: Pulmonary Disease | Admitting: Pulmonary Disease

## 2013-07-09 ENCOUNTER — Encounter (HOSPITAL_COMMUNITY)
Admission: RE | Admit: 2013-07-09 | Discharge: 2013-07-09 | Disposition: A | Discharge status: Home / Self Care | Payer: Self-pay | Source: Ambulatory Visit | Attending: Pulmonary Disease | Admitting: Pulmonary Disease

## 2013-07-10 ENCOUNTER — Other Ambulatory Visit: Payer: Self-pay | Admitting: Endocrinology

## 2013-07-10 ENCOUNTER — Other Ambulatory Visit: Payer: Self-pay | Admitting: *Deleted

## 2013-07-10 MED ORDER — CALCITRIOL 0.25 MCG PO CAPS: 0.2500 ug | ORAL_CAPSULE | Freq: Every day | ORAL | Status: DC

## 2013-07-14 ENCOUNTER — Encounter (HOSPITAL_COMMUNITY)
Admission: RE | Admit: 2013-07-14 | Discharge: 2013-07-14 | Disposition: A | Discharge status: Home / Self Care | Payer: Self-pay | Source: Ambulatory Visit | Attending: Pulmonary Disease | Admitting: Pulmonary Disease

## 2013-07-16 ENCOUNTER — Inpatient Hospital Stay (HOSPITAL_COMMUNITY)
Admission: EM | Admit: 2013-07-16 | Discharge: 2013-07-23 | DRG: 871 | Disposition: A | Discharge status: Home / Self Care | Payer: Medicare Other | Attending: Internal Medicine | Admitting: Internal Medicine

## 2013-07-16 ENCOUNTER — Encounter (HOSPITAL_COMMUNITY): Payer: Self-pay

## 2013-07-16 ENCOUNTER — Encounter (HOSPITAL_COMMUNITY): Payer: Self-pay | Admitting: *Deleted

## 2013-07-16 ENCOUNTER — Emergency Department (HOSPITAL_COMMUNITY): Payer: Medicare Other

## 2013-07-16 DIAGNOSIS — F3289 Other specified depressive episodes: Secondary | ICD-10-CM | POA: Diagnosis present

## 2013-07-16 DIAGNOSIS — Z79899 Other long term (current) drug therapy: Secondary | ICD-10-CM | POA: Diagnosis not present

## 2013-07-16 DIAGNOSIS — R7989 Other specified abnormal findings of blood chemistry: Secondary | ICD-10-CM

## 2013-07-16 DIAGNOSIS — J439 Emphysema, unspecified: Secondary | ICD-10-CM | POA: Diagnosis present

## 2013-07-16 DIAGNOSIS — D509 Iron deficiency anemia, unspecified: Secondary | ICD-10-CM | POA: Diagnosis present

## 2013-07-16 DIAGNOSIS — I359 Nonrheumatic aortic valve disorder, unspecified: Secondary | ICD-10-CM | POA: Diagnosis not present

## 2013-07-16 DIAGNOSIS — Z85828 Personal history of other malignant neoplasm of skin: Secondary | ICD-10-CM

## 2013-07-16 DIAGNOSIS — I739 Peripheral vascular disease, unspecified: Secondary | ICD-10-CM | POA: Diagnosis present

## 2013-07-16 DIAGNOSIS — E119 Type 2 diabetes mellitus without complications: Secondary | ICD-10-CM | POA: Diagnosis present

## 2013-07-16 DIAGNOSIS — E785 Hyperlipidemia, unspecified: Secondary | ICD-10-CM | POA: Diagnosis present

## 2013-07-16 DIAGNOSIS — J189 Pneumonia, unspecified organism: Secondary | ICD-10-CM | POA: Diagnosis not present

## 2013-07-16 DIAGNOSIS — Z9861 Coronary angioplasty status: Secondary | ICD-10-CM

## 2013-07-16 DIAGNOSIS — J13 Pneumonia due to Streptococcus pneumoniae: Secondary | ICD-10-CM | POA: Diagnosis not present

## 2013-07-16 DIAGNOSIS — Z7982 Long term (current) use of aspirin: Secondary | ICD-10-CM | POA: Diagnosis not present

## 2013-07-16 DIAGNOSIS — Z86711 Personal history of pulmonary embolism: Secondary | ICD-10-CM

## 2013-07-16 DIAGNOSIS — R079 Chest pain, unspecified: Secondary | ICD-10-CM

## 2013-07-16 DIAGNOSIS — Z794 Long term (current) use of insulin: Secondary | ICD-10-CM

## 2013-07-16 DIAGNOSIS — I2510 Atherosclerotic heart disease of native coronary artery without angina pectoris: Secondary | ICD-10-CM | POA: Diagnosis present

## 2013-07-16 DIAGNOSIS — J019 Acute sinusitis, unspecified: Secondary | ICD-10-CM | POA: Diagnosis not present

## 2013-07-16 DIAGNOSIS — R6520 Severe sepsis without septic shock: Secondary | ICD-10-CM

## 2013-07-16 DIAGNOSIS — Z9884 Bariatric surgery status: Secondary | ICD-10-CM | POA: Diagnosis not present

## 2013-07-16 DIAGNOSIS — Z9981 Dependence on supplemental oxygen: Secondary | ICD-10-CM

## 2013-07-16 DIAGNOSIS — R109 Unspecified abdominal pain: Secondary | ICD-10-CM | POA: Diagnosis not present

## 2013-07-16 DIAGNOSIS — J4489 Other specified chronic obstructive pulmonary disease: Secondary | ICD-10-CM | POA: Diagnosis not present

## 2013-07-16 DIAGNOSIS — M1990 Unspecified osteoarthritis, unspecified site: Secondary | ICD-10-CM | POA: Diagnosis present

## 2013-07-16 DIAGNOSIS — I498 Other specified cardiac arrhythmias: Secondary | ICD-10-CM | POA: Diagnosis present

## 2013-07-16 DIAGNOSIS — I214 Non-ST elevation (NSTEMI) myocardial infarction: Secondary | ICD-10-CM | POA: Diagnosis not present

## 2013-07-16 DIAGNOSIS — E1129 Type 2 diabetes mellitus with other diabetic kidney complication: Secondary | ICD-10-CM | POA: Diagnosis present

## 2013-07-16 DIAGNOSIS — F329 Major depressive disorder, single episode, unspecified: Secondary | ICD-10-CM | POA: Diagnosis not present

## 2013-07-16 DIAGNOSIS — M7989 Other specified soft tissue disorders: Secondary | ICD-10-CM

## 2013-07-16 DIAGNOSIS — Z87891 Personal history of nicotine dependence: Secondary | ICD-10-CM

## 2013-07-16 DIAGNOSIS — I10 Essential (primary) hypertension: Secondary | ICD-10-CM

## 2013-07-16 DIAGNOSIS — N2581 Secondary hyperparathyroidism of renal origin: Secondary | ICD-10-CM | POA: Diagnosis present

## 2013-07-16 DIAGNOSIS — I69959 Hemiplegia and hemiparesis following unspecified cerebrovascular disease affecting unspecified side: Secondary | ICD-10-CM | POA: Diagnosis not present

## 2013-07-16 DIAGNOSIS — J449 Chronic obstructive pulmonary disease, unspecified: Secondary | ICD-10-CM | POA: Diagnosis not present

## 2013-07-16 DIAGNOSIS — A419 Sepsis, unspecified organism: Secondary | ICD-10-CM | POA: Diagnosis not present

## 2013-07-16 DIAGNOSIS — K5900 Constipation, unspecified: Secondary | ICD-10-CM | POA: Diagnosis present

## 2013-07-16 DIAGNOSIS — R0789 Other chest pain: Secondary | ICD-10-CM | POA: Diagnosis not present

## 2013-07-16 DIAGNOSIS — R0602 Shortness of breath: Secondary | ICD-10-CM | POA: Diagnosis not present

## 2013-07-16 DIAGNOSIS — I129 Hypertensive chronic kidney disease with stage 1 through stage 4 chronic kidney disease, or unspecified chronic kidney disease: Secondary | ICD-10-CM | POA: Diagnosis present

## 2013-07-16 DIAGNOSIS — I509 Heart failure, unspecified: Secondary | ICD-10-CM | POA: Diagnosis not present

## 2013-07-16 DIAGNOSIS — Z4651 Encounter for fitting and adjustment of gastric lap band: Secondary | ICD-10-CM | POA: Diagnosis not present

## 2013-07-16 DIAGNOSIS — Z23 Encounter for immunization: Secondary | ICD-10-CM

## 2013-07-16 DIAGNOSIS — K219 Gastro-esophageal reflux disease without esophagitis: Secondary | ICD-10-CM

## 2013-07-16 DIAGNOSIS — J438 Other emphysema: Secondary | ICD-10-CM | POA: Diagnosis not present

## 2013-07-16 DIAGNOSIS — I5032 Chronic diastolic (congestive) heart failure: Secondary | ICD-10-CM | POA: Diagnosis not present

## 2013-07-16 DIAGNOSIS — M79609 Pain in unspecified limb: Secondary | ICD-10-CM

## 2013-07-16 DIAGNOSIS — N058 Unspecified nephritic syndrome with other morphologic changes: Secondary | ICD-10-CM | POA: Diagnosis present

## 2013-07-16 DIAGNOSIS — F32A Depression, unspecified: Secondary | ICD-10-CM

## 2013-07-16 DIAGNOSIS — N183 Chronic kidney disease, stage 3 unspecified: Secondary | ICD-10-CM | POA: Diagnosis present

## 2013-07-16 DIAGNOSIS — R778 Other specified abnormalities of plasma proteins: Secondary | ICD-10-CM

## 2013-07-16 DIAGNOSIS — R1310 Dysphagia, unspecified: Secondary | ICD-10-CM | POA: Diagnosis present

## 2013-07-16 DIAGNOSIS — I5043 Acute on chronic combined systolic (congestive) and diastolic (congestive) heart failure: Secondary | ICD-10-CM | POA: Diagnosis present

## 2013-07-16 DIAGNOSIS — I252 Old myocardial infarction: Secondary | ICD-10-CM

## 2013-07-16 HISTORY — DX: Personal history of other medical treatment: Z92.89

## 2013-07-16 HISTORY — DX: Dependence on supplemental oxygen: Z99.81

## 2013-07-16 HISTORY — DX: Basal cell carcinoma of skin of unspecified upper limb, including shoulder: C44.611

## 2013-07-16 HISTORY — DX: Acute myocardial infarction, unspecified: I21.9

## 2013-07-16 HISTORY — DX: Gastro-esophageal reflux disease without esophagitis: K21.9

## 2013-07-16 HISTORY — DX: Type 2 diabetes mellitus without complications: E11.9

## 2013-07-16 HISTORY — DX: Depression, unspecified: F32.A

## 2013-07-16 HISTORY — DX: Squamous cell carcinoma of skin of unspecified upper limb, including shoulder: C44.621

## 2013-07-16 HISTORY — DX: Other pulmonary embolism without acute cor pulmonale: I26.99

## 2013-07-16 HISTORY — DX: Major depressive disorder, single episode, unspecified: F32.9

## 2013-07-16 LAB — BASIC METABOLIC PANEL
Calcium: 9.2 mg/dL (ref 8.4–10.5)
GFR calc non Af Amer: 45 mL/min — ABNORMAL LOW (ref >90)
Glucose, Bld: 294 mg/dL — ABNORMAL HIGH (ref 70–99)
Sodium: 136 mEq/L (ref 135–145)

## 2013-07-16 LAB — URINALYSIS, ROUTINE W REFLEX MICROSCOPIC
Leukocytes, UA: NEGATIVE
Protein, ur: 100 mg/dL — AB
Urobilinogen, UA: 0.2 mg/dL (ref 0.0–1.0)

## 2013-07-16 LAB — CBC WITH DIFFERENTIAL/PLATELET
Basophils Absolute: 0 10*3/uL (ref 0.0–0.1)
Basophils Relative: 0 % (ref 0–1)
Eosinophils Absolute: 0.1 10*3/uL (ref 0.0–0.7)
MCH: 29.9 pg (ref 26.0–34.0)
MCHC: 33.9 g/dL (ref 30.0–36.0)
Neutrophils Relative %: 90 % — ABNORMAL HIGH (ref 43–77)
Platelets: 183 10*3/uL (ref 150–400)
RBC: 4.51 MIL/uL (ref 4.22–5.81)

## 2013-07-16 LAB — PRO B NATRIURETIC PEPTIDE: Pro B Natriuretic peptide (BNP): 985.5 pg/mL — ABNORMAL HIGH (ref 0–125)

## 2013-07-16 LAB — CG4 I-STAT (LACTIC ACID): Lactic Acid, Venous: 2.57 mmol/L — ABNORMAL HIGH (ref 0.5–2.2)

## 2013-07-16 LAB — POCT I-STAT 3, ART BLOOD GAS (G3+)
O2 Saturation: 99 %
TCO2: 27 mmol/L (ref 0–100)
pCO2 arterial: 26.9 mmHg — ABNORMAL LOW (ref 35.0–45.0)

## 2013-07-16 LAB — GLUCOSE, CAPILLARY
Glucose-Capillary: 288 mg/dL — ABNORMAL HIGH (ref 70–99)
Glucose-Capillary: 488 mg/dL — ABNORMAL HIGH (ref 70–99)

## 2013-07-16 LAB — POCT I-STAT TROPONIN I

## 2013-07-16 LAB — TROPONIN I: Troponin I: 2.91 ng/mL (ref <0.30)

## 2013-07-16 MED ORDER — ENOXAPARIN SODIUM 40 MG/0.4ML ~~LOC~~ SOLN
40.0000 mg | SUBCUTANEOUS | Status: DC
  Administered 2013-07-16: 40 mg via SUBCUTANEOUS
  Filled 2013-07-16: qty 0.4

## 2013-07-16 MED ORDER — FA-PYRIDOXINE-CYANOCOBALAMIN 2.5-25-2 MG PO TABS
1.0000 | ORAL_TABLET | Freq: Every day | ORAL | Status: DC
  Administered 2013-07-16 – 2013-07-23 (×8): 1 via ORAL
  Filled 2013-07-16 (×9): qty 1

## 2013-07-16 MED ORDER — FINASTERIDE 5 MG PO TABS
5.0000 mg | ORAL_TABLET | Freq: Every day | ORAL | Status: DC
  Administered 2013-07-16 – 2013-07-23 (×8): 5 mg via ORAL
  Filled 2013-07-16 (×8): qty 1

## 2013-07-16 MED ORDER — DILTIAZEM HCL ER COATED BEADS 180 MG PO CP24
180.0000 mg | ORAL_CAPSULE | Freq: Every day | ORAL | Status: DC
  Administered 2013-07-16 – 2013-07-23 (×8): 180 mg via ORAL
  Filled 2013-07-16 (×8): qty 1

## 2013-07-16 MED ORDER — CITALOPRAM HYDROBROMIDE 20 MG PO TABS
20.0000 mg | ORAL_TABLET | Freq: Every day | ORAL | Status: DC
  Administered 2013-07-16 – 2013-07-23 (×8): 20 mg via ORAL
  Filled 2013-07-16 (×8): qty 1

## 2013-07-16 MED ORDER — FERROUS SULFATE 325 (65 FE) MG PO TABS
325.0000 mg | ORAL_TABLET | Freq: Every day | ORAL | Status: DC
  Administered 2013-07-16 – 2013-07-23 (×8): 325 mg via ORAL
  Filled 2013-07-16 (×10): qty 1

## 2013-07-16 MED ORDER — TIOTROPIUM BROMIDE MONOHYDRATE 18 MCG IN CAPS
18.0000 ug | ORAL_CAPSULE | Freq: Every day | RESPIRATORY_TRACT | Status: DC
  Administered 2013-07-16 – 2013-07-23 (×7): 18 ug via RESPIRATORY_TRACT
  Filled 2013-07-16: qty 5

## 2013-07-16 MED ORDER — SODIUM CHLORIDE 0.9 % IV BOLUS (SEPSIS)
1000.0000 mL | Freq: Once | INTRAVENOUS | Status: AC
  Administered 2013-07-16: 1000 mL via INTRAVENOUS

## 2013-07-16 MED ORDER — HYDROCODONE-ACETAMINOPHEN 10-325 MG PO TABS
1.0000 | ORAL_TABLET | Freq: Four times a day (QID) | ORAL | Status: DC | PRN
  Administered 2013-07-16 – 2013-07-22 (×8): 1 via ORAL
  Filled 2013-07-16 (×8): qty 1

## 2013-07-16 MED ORDER — ALBUTEROL SULFATE (5 MG/ML) 0.5% IN NEBU
2.5000 mg | INHALATION_SOLUTION | Freq: Four times a day (QID) | RESPIRATORY_TRACT | Status: DC
  Administered 2013-07-16 – 2013-07-21 (×15): 2.5 mg via RESPIRATORY_TRACT
  Filled 2013-07-16 (×16): qty 0.5

## 2013-07-16 MED ORDER — SODIUM CHLORIDE 0.9 % IV SOLN: Freq: Once | INTRAVENOUS | Status: DC

## 2013-07-16 MED ORDER — ACETAMINOPHEN 650 MG RE SUPP: 650.0000 mg | Freq: Four times a day (QID) | RECTAL | Status: DC | PRN

## 2013-07-16 MED ORDER — ONDANSETRON HCL 4 MG/2ML IJ SOLN: 4.0000 mg | Freq: Four times a day (QID) | INTRAMUSCULAR | Status: DC | PRN

## 2013-07-16 MED ORDER — ALLOPURINOL 150 MG HALF TABLET
150.0000 mg | ORAL_TABLET | Freq: Every day | ORAL | Status: DC
  Administered 2013-07-16 – 2013-07-23 (×8): 150 mg via ORAL
  Filled 2013-07-16 (×8): qty 1

## 2013-07-16 MED ORDER — PIPERACILLIN-TAZOBACTAM 3.375 G IVPB 30 MIN
3.3750 g | Freq: Three times a day (TID) | INTRAVENOUS | Status: DC
  Administered 2013-07-16 – 2013-07-18 (×7): 3.375 g via INTRAVENOUS
  Filled 2013-07-16 (×9): qty 50

## 2013-07-16 MED ORDER — INSULIN ASPART 100 UNIT/ML ~~LOC~~ SOLN
0.0000 [IU] | Freq: Three times a day (TID) | SUBCUTANEOUS | Status: DC
  Administered 2013-07-17: 5 [IU] via SUBCUTANEOUS
  Administered 2013-07-17: 9 [IU] via SUBCUTANEOUS

## 2013-07-16 MED ORDER — TRAZODONE HCL 100 MG PO TABS
100.0000 mg | ORAL_TABLET | Freq: Every day | ORAL | Status: DC
  Administered 2013-07-16 – 2013-07-22 (×7): 100 mg via ORAL
  Filled 2013-07-16 (×8): qty 1

## 2013-07-16 MED ORDER — SODIUM CHLORIDE 0.9 % IJ SOLN
3.0000 mL | Freq: Two times a day (BID) | INTRAMUSCULAR | Status: DC
  Administered 2013-07-17 – 2013-07-21 (×7): 3 mL via INTRAVENOUS

## 2013-07-16 MED ORDER — ONDANSETRON HCL 4 MG PO TABS: 4.0000 mg | ORAL_TABLET | Freq: Four times a day (QID) | ORAL | Status: DC | PRN

## 2013-07-16 MED ORDER — BUDESONIDE-FORMOTEROL FUMARATE 160-4.5 MCG/ACT IN AERO
2.0000 | INHALATION_SPRAY | Freq: Two times a day (BID) | RESPIRATORY_TRACT | Status: DC
  Administered 2013-07-16 – 2013-07-23 (×13): 2 via RESPIRATORY_TRACT
  Filled 2013-07-16: qty 6

## 2013-07-16 MED ORDER — DILTIAZEM HCL ER BEADS 180 MG PO CP24: 180.0000 mg | ORAL_CAPSULE | Freq: Every day | ORAL | Status: DC

## 2013-07-16 MED ORDER — LEVOFLOXACIN IN D5W 750 MG/150ML IV SOLN
750.0000 mg | Freq: Once | INTRAVENOUS | Status: AC
  Administered 2013-07-16: 750 mg via INTRAVENOUS
  Filled 2013-07-16: qty 150

## 2013-07-16 MED ORDER — LEVOFLOXACIN 750 MG PO TABS
750.0000 mg | ORAL_TABLET | ORAL | Status: DC
  Administered 2013-07-18 – 2013-07-22 (×3): 750 mg via ORAL
  Filled 2013-07-16 (×3): qty 1

## 2013-07-16 MED ORDER — CALCITRIOL 0.25 MCG PO CAPS
0.2500 ug | ORAL_CAPSULE | Freq: Every day | ORAL | Status: DC
  Administered 2013-07-16 – 2013-07-23 (×8): 0.25 ug via ORAL
  Filled 2013-07-16 (×8): qty 1

## 2013-07-16 MED ORDER — ASPIRIN 81 MG PO CHEW
81.0000 mg | CHEWABLE_TABLET | Freq: Every day | ORAL | Status: DC
  Administered 2013-07-16 – 2013-07-23 (×8): 81 mg via ORAL
  Filled 2013-07-16 (×8): qty 1

## 2013-07-16 MED ORDER — ACARBOSE 25 MG PO TABS
25.0000 mg | ORAL_TABLET | Freq: Three times a day (TID) | ORAL | Status: DC
  Administered 2013-07-16 – 2013-07-19 (×8): 25 mg via ORAL
  Filled 2013-07-16 (×12): qty 1

## 2013-07-16 MED ORDER — PNEUMOCOCCAL VAC POLYVALENT 25 MCG/0.5ML IJ INJ
0.5000 mL | INJECTION | INTRAMUSCULAR | Status: AC
  Administered 2013-07-17: 0.5 mL via INTRAMUSCULAR
  Filled 2013-07-16: qty 0.5

## 2013-07-16 MED ORDER — PANTOPRAZOLE SODIUM 40 MG PO TBEC
40.0000 mg | DELAYED_RELEASE_TABLET | Freq: Every day | ORAL | Status: DC
  Administered 2013-07-16 – 2013-07-23 (×7): 40 mg via ORAL
  Filled 2013-07-16 (×6): qty 1

## 2013-07-16 MED ORDER — ISOSORBIDE MONONITRATE ER 60 MG PO TB24
60.0000 mg | ORAL_TABLET | Freq: Every day | ORAL | Status: DC
  Administered 2013-07-16 – 2013-07-23 (×8): 60 mg via ORAL
  Filled 2013-07-16 (×8): qty 1

## 2013-07-16 MED ORDER — ASPIRIN 81 MG PO TABS: 81.0000 mg | ORAL_TABLET | Freq: Every day | ORAL | Status: DC

## 2013-07-16 MED ORDER — ACETAMINOPHEN 325 MG PO TABS: 650.0000 mg | ORAL_TABLET | Freq: Four times a day (QID) | ORAL | Status: DC | PRN

## 2013-07-16 MED ORDER — ACETAMINOPHEN 325 MG PO TABS
650.0000 mg | ORAL_TABLET | Freq: Once | ORAL | Status: AC
  Administered 2013-07-16: 650 mg via ORAL
  Filled 2013-07-16: qty 2

## 2013-07-16 MED ORDER — ATORVASTATIN CALCIUM 20 MG PO TABS
20.0000 mg | ORAL_TABLET | Freq: Every day | ORAL | Status: DC
  Administered 2013-07-16 – 2013-07-22 (×7): 20 mg via ORAL
  Filled 2013-07-16 (×9): qty 1

## 2013-07-16 MED ORDER — DOCUSATE SODIUM 100 MG PO CAPS
100.0000 mg | ORAL_CAPSULE | Freq: Two times a day (BID) | ORAL | Status: DC
  Administered 2013-07-16 – 2013-07-23 (×13): 100 mg via ORAL
  Filled 2013-07-16 (×16): qty 1

## 2013-07-16 MED ORDER — SENNA 8.6 MG PO TABS
1.0000 | ORAL_TABLET | Freq: Every day | ORAL | Status: DC
  Administered 2013-07-16 – 2013-07-23 (×7): 8.6 mg via ORAL
  Filled 2013-07-16 (×8): qty 1

## 2013-07-16 MED ORDER — VANCOMYCIN HCL 10 G IV SOLR
1500.0000 mg | Freq: Once | INTRAVENOUS | Status: AC
  Administered 2013-07-16: 1500 mg via INTRAVENOUS
  Filled 2013-07-16: qty 1500

## 2013-07-16 MED ORDER — LORAZEPAM 2 MG/ML IJ SOLN
1.0000 mg | Freq: Once | INTRAMUSCULAR | Status: AC
  Administered 2013-07-16: 1 mg via INTRAVENOUS
  Filled 2013-07-16: qty 1

## 2013-07-16 MED ORDER — INSULIN ASPART 100 UNIT/ML ~~LOC~~ SOLN
15.0000 [IU] | Freq: Once | SUBCUTANEOUS | Status: AC
  Administered 2013-07-16: 15 [IU] via SUBCUTANEOUS

## 2013-07-16 MED ORDER — VANCOMYCIN HCL IN DEXTROSE 750-5 MG/150ML-% IV SOLN
750.0000 mg | Freq: Two times a day (BID) | INTRAVENOUS | Status: DC
  Administered 2013-07-17 – 2013-07-18 (×4): 750 mg via INTRAVENOUS
  Filled 2013-07-16 (×5): qty 150

## 2013-07-16 MED ORDER — BISOPROLOL FUMARATE 5 MG PO TABS
5.0000 mg | ORAL_TABLET | Freq: Every day | ORAL | Status: DC
  Administered 2013-07-16 – 2013-07-23 (×7): 5 mg via ORAL
  Filled 2013-07-16 (×8): qty 1

## 2013-07-16 MED ORDER — INSULIN ASPART 100 UNIT/ML ~~LOC~~ SOLN
8.0000 [IU] | Freq: Once | SUBCUTANEOUS | Status: AC
  Administered 2013-07-16: 8 [IU] via SUBCUTANEOUS

## 2013-07-16 NOTE — Progress Notes (Signed)
Pt to lab for venous duplex studies
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
## 2013-07-16 NOTE — ED Provider Notes (Signed)
History    CSN: [id number] Arrival date & time 07/16/13  3474  First MD Initiated Contact with Patient 07/16/13 0745     Chief Complaint  Patient presents with  . Shortness of Breath  . Chest Pain   (Consider location/radiation/quality/duration/timing/severity/associated sxs/prior Treatment) The history is provided by the patient. No language interpreter was used.  Cannon Arreola. is a 72 y/o M with PMHx of COPD, HLD, DM II, chronic diastolic CHF (last echo performed 05/2007 reported EF of 55%-60%), gastric bypass surgery 2012, cardiac cath 2006, brought in by EMS with shortness of breath, chest tightness, and difficulty breathing. As per nurse, patient was given nebulizer treatment of 10 mg albuterol and 0.5 mg atrovent, solumedrol 125 mg IV, and epi 0.15 IM while en route to ED. Patient reported that he had sudden onset of chest tightness and shortness of breath at approximately 5:00AM this morning. Reported that the chest tightness is to the center of his chest, squeezing sensation. Patient reported that he has been coughing phlegm, mainly white consistency throughout the day. Upon arrival patient had a fever of 102 degrees Fahrenheit. Patient reported that he uses oxygen at night - 2L/min. Denied nausea, vomiting, dizziness, visual distortions, abdominal pain, hemoptysis.  PCP: Dr. Romero Belling Pulmonologist: Dr. Marcelyn Bruins  Past Medical History  Diagnosis Date  . Allergic rhinitis   . COPD (chronic obstructive pulmonary disease)     mild to moderate by pfts in 2006  . HLD (hyperlipidemia)   . Impotence   . PVD (peripheral vascular disease)   . Type II or unspecified type diabetes mellitus with renal manifestations, not stated as uncontrolled(250.40)   . Renal insufficiency   . Osteoarthritis   . Cough     due to Zestril  . Encounter for long-term (current) use of aspirin   . Encounter for long-term (current) use of antiplatelets/antithrombotics   . Essential hypertension,  benign   . Chronic diastolic heart failure   . Coronary atherosclerosis of native coronary artery   . Shortness of breath   . Other emphysema   . Edema   . Special screening for malignant neoplasm of prostate   . Conjunctivitis unspecified   . Hypopotassemia   . Osteoporosis, unspecified   . Secondary hyperparathyroidism (of renal origin)   . Gout, unspecified   . Pneumonia, organism unspecified   . 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 Hemiplegia affecting unspecified side, late effect of cerebrovascular disease   . Headache(784.0)   . Other and unspecified hyperlipidemia   . Nephropathy, diabetic   . Cancer 04/01/12    skin x3  . CHF (congestive heart failure) previous hx  . Stroke 04-01-12    2007, mild   left arm weakness   Past Surgical History  Procedure Laterality Date  . Nasal sinus surgery    . Esophagogastroduodenoscopy    . Cardiac catheterization  01/18/2005  . Abdominal surgery    . Laparoscopic gastric banding  05/29/2011   Family History  Problem Relation Age of Onset  . Hypertension      family history of htn  . Heart disease Maternal Aunt   . Lung cancer Mother   . Cancer Mother     Lung Cancer  . Heart disease Father     CHF   History  Substance Use Topics  . Smoking status: Former Smoker -- 2.00 packs/day for 41 years    Types: Cigarettes  Quit date: 12/24/1997  . Smokeless tobacco: Former Neurosurgeon  . Alcohol Use: No    Review of Systems  Constitutional: Negative for fever and chills.  HENT: Negative for trouble swallowing and neck pain.   Eyes: Negative for visual disturbance.  Respiratory: Positive for cough, chest tightness and shortness of breath.   Cardiovascular: Negative for chest pain.  Gastrointestinal: Negative for nausea, vomiting and abdominal distention.  Neurological: Negative for dizziness, weakness and numbness.  All other systems reviewed and are  negative.    Allergies  Enalapril maleate and Shellfish-derived products  Home Medications   Current Outpatient Rx  Name  Route  Sig  Dispense  Refill  . acarbose (PRECOSE) 25 MG tablet   Oral   Take 25 mg by mouth 3 (three) times daily with meals.         Marland Kitchen albuterol (VENTOLIN HFA) 108 (90 BASE) MCG/ACT inhaler   Inhalation   Inhale 2 puffs into the lungs every 6 (six) hours as needed.           Marland Kitchen allopurinol (ZYLOPRIM) 300 MG tablet      TAKE 1 TABLET BY MOUTH DAILY   30 tablet   11   . amoxicillin-clavulanate (AUGMENTIN) 875-125 MG per tablet   Oral   Take 1 tablet by mouth 2 (two) times daily.   14 tablet   0   . aspirin 81 MG tablet   Oral   Take 81 mg by mouth daily.           . ASTEPRO 0.15 % SOLN      SPRAY TWICE IN EACH NOSTRIL EVERY MORNING AS NEEDED   30 mL   0   . bisoprolol (ZEBETA) 5 MG tablet   Oral   Take 1 tablet (5 mg total) by mouth daily.   90 tablet   2   . budesonide-formoterol (SYMBICORT) 160-4.5 MCG/ACT inhaler   Inhalation   Inhale 2 puffs into the lungs 2 (two) times daily.           . calcitRIOL (ROCALTROL) 0.25 MCG capsule   Oral   Take 1 capsule (0.25 mcg total) by mouth daily.   90 capsule   0   . citalopram (CELEXA) 20 MG tablet   Oral   Take 1 tablet (20 mg total) by mouth daily.   30 tablet   6   . ferrous sulfate 325 (65 FE) MG tablet   Oral   Take 325 mg by mouth daily with breakfast.          . finasteride (PROSCAR) 5 MG tablet   Oral   Take 1 tablet (5 mg total) by mouth daily.   30 tablet   4   . folic acid-pyridoxine-cyancobalamin (FOLBIC) 2.5-25-2 MG TABS   Oral   Take 1 tablet by mouth daily.   90 tablet   2   . furosemide (LASIX) 40 MG tablet   Oral   Take 1 tablet (40 mg total) by mouth as directed. 80 MG IN THE AM AND 120 MG IN THE PM   150 tablet   6   . glucose blood test strip      Four times a day, variable glucoses dx 250.03         . HYDROcodone-acetaminophen (NORCO)  10-325 MG per tablet   Oral   Take 1 tablet by mouth every 6 (six) hours as needed for pain.   100 tablet   5   . Insulin  Aspart (NOVOLOG FLEXPEN [place])   Subcutaneous   Inject 10 Units into the skin 3 (three) times daily with meals. And pen needles 3/day         . isosorbide mononitrate (IMDUR) 60 MG 24 hr tablet   Oral   Take 1 tablet (60 mg total) by mouth daily.   90 tablet   3     Wants Generic   . Multiple Vitamins-Minerals (MULTIVITAMIN PO)   Oral   Take 1 tablet by mouth daily.           . nitroGLYCERIN (NITROLINGUAL) 0.4 MG/SPRAY spray   Sublingual   Place 1 spray under the tongue as directed.           Marland Kitchen omeprazole (PRILOSEC) 20 MG capsule      TAKE ONE CAPSULE BY MOUTH DAILY   90 capsule   0   . polyethylene glycol powder (MIRALAX) powder   Oral   Take 17 g by mouth as needed.           . predniSONE (DELTASONE) 10 MG tablet      Take 4 tabs po x 2 days, then 3 x 2 days, then 2 x 2 days, then 1 x 2 days then stop.   20 tablet   0   . Protein (UNJURY UNFLAVORED PO)   Oral   Take 8 oz by mouth daily.           . rosuvastatin (CRESTOR) 10 MG tablet      TAKE 1 TABLET BY MOUTH ONCE DAILY   90 tablet   3   . TAZTIA XT 180 MG 24 hr capsule      TAKE ONE CAPSULE BY MOUTH DAILY   90 capsule   3   . tiotropium (SPIRIVA) 18 MCG inhalation capsule   Inhalation   Place 18 mcg into inhaler and inhale daily.          . traZODone (DESYREL) 100 MG tablet      TAKE 1 TABLET BY MOUTH EVERY NIGHT AT BEDTIME   90 tablet   0   . triamcinolone (KENALOG) 0.025 % cream      APPLY THREE TIMES DAILY ON AFFECTED AREA AS NEEDED   80 g   1    BP 143/46  Pulse 101  Temp(Src) 100.9 F (38.3 C) (Oral)  Resp 24  Ht 5\' 3"  (1.6 m)  Wt 209 lb (94.802 kg)  BMI 37.03 kg/m2  SpO2 97% Physical Exam  Nursing note and vitals reviewed. Constitutional: He is oriented to person, place, and time. He appears well-developed and well-nourished. No distress.   HENT:  Head: Normocephalic and atraumatic.  Mouth/Throat: Oropharynx is clear and moist.  Eyes: Conjunctivae and EOM are normal. Pupils are equal, round, and reactive to light. Right eye exhibits no discharge. Left eye exhibits no discharge.  Neck: Normal range of motion. Neck supple.  Cardiovascular: Regular rhythm and normal heart sounds.  Exam reveals no friction rub.   No murmur heard. Pulses:      Radial pulses are 2+ on the right side, and 2+ on the left side.       Dorsalis pedis pulses are 2+ on the right side, and 2+ on the left side.  Tachycardia noted Swelling noted to the right calf  Negative ankle swelling Negative pitting edema bilaterally Negative Homan's sign bilaterally Cap refill < 3 seconds Negative cyanosis Negative clubbing  Pulmonary/Chest: He has wheezes (expiratory wheeze to left lower lobe).  He has rales (rigth lower lobe).  Increased effort to breath  Lymphadenopathy:    He has no cervical adenopathy.  Neurological: He is alert and oriented to person, place, and time. He exhibits normal muscle tone. Coordination normal.  Skin: Skin is warm and dry. No rash noted. He is not diaphoretic. No erythema.  Psychiatric: He has a normal mood and affect. His behavior is normal. Thought content normal.    ED Course  Procedures (including critical care time)  10:36PM Discussed lab findings in depth with patient and wife at bedside.   10:44AM Spoke with Dr. Arbutus Leas from Internal Medicine, [facility] - going to admit patient   Date: 07/16/2013  Rate: 105  Rhythm: sinus tachycardia  QRS Axis: normal  Intervals: normal  ST/T Wave abnormalities: normal  Conduction Disutrbances:none  Narrative Interpretation:Premature ventricular complexes noted  Old EKG Reviewed: changes noted   Labs Reviewed  CBC WITH DIFFERENTIAL - Abnormal; Notable for the following:    WBC 13.5 (*)    Neutrophils Relative % 90 (*)    Neutro Abs 12.2 (*)    Lymphocytes Relative 4 (*)     Lymphs Abs 0.6 (*)    All other components within normal limits  URINALYSIS, ROUTINE W REFLEX MICROSCOPIC - Abnormal; Notable for the following:    Glucose, UA 100 (*)    Ketones, ur 15 (*)    Protein, ur 100 (*)    All other components within normal limits  BASIC METABOLIC PANEL - Abnormal; Notable for the following:    Glucose, Bld 294 (*)    BUN 40 (*)    Creatinine, Ser 1.50 (*)    GFR calc non Af Amer 45 (*)    GFR calc Af Amer 52 (*)    All other components within normal limits  PRO B NATRIURETIC PEPTIDE - Abnormal; Notable for the following:    Pro B Natriuretic peptide (BNP) 985.5 (*)    All other components within normal limits  GLUCOSE, CAPILLARY - Abnormal; Notable for the following:    Glucose-Capillary 288 (*)    All other components within normal limits  URINE MICROSCOPIC-ADD ON - Abnormal; Notable for the following:    Casts HYALINE CASTS (*)    All other components within normal limits  POCT I-STAT 3, BLOOD GAS (G3+) - Abnormal; Notable for the following:    pH, Arterial 7.589 (*)    pCO2 arterial 26.9 (*)    pO2, Arterial 103.0 (*)    Bicarbonate 25.8 (*)    Acid-Base Excess 5.0 (*)    All other components within normal limits  CG4 I-STAT (LACTIC ACID) - Abnormal; Notable for the following:    Lactic Acid, Venous 2.57 (*)    All other components within normal limits  CULTURE, BLOOD (ROUTINE X 2)  CULTURE, BLOOD (ROUTINE X 2)  BLOOD GAS, ARTERIAL  POCT I-STAT TROPONIN I   Dg Chest Portable 1 View  07/16/2013   *RADIOLOGY REPORT*  Clinical Data: Right chest pain with shortness of breath.  History of COPD, diabetes and congestive heart failure.  PORTABLE CHEST - 1 VIEW  Comparison: CT and radiographs 10/21/2011.  Findings: 0842 hours.  There is stable cardiomegaly and aortic atherosclerosis.  There are new patchy asymmetric air space opacities in the right lung with some nodular components.  The left lung is clear.  There is no definite edema or significant  pleural effusion.  Osseous structures appear unchanged.  IMPRESSION: New asymmetric nodular opacities in the right lung,  probably reflecting multifocal pneumonia.  Radiographic followup is necessary to document clearing and exclude an underlying mass lesion.   Original Report Authenticated By: Carey Bullocks, M.D.   1. CAP (community acquired pneumonia)   2. Severe sepsis     MDM  Patient presenting to the ED with shortness of breath, chest tightness - PMHx of COPd on home oxygen therapy at night.  Mild expiratory wheeze noted to the left lower lobe and rales noted to the right lower lobe. CBC midl elevation in WBC count noted, 13.5. Increase in Cr from 1.24 to 1.50 - BUN increased to 40 - renal disorder noted. UA noted ketones - no anion gap noted - doubt DKA. EKG noted pvc's - no run of PVCs - negative ischemic changes noted. First set of troponins negative. Lactate with mild elevation of 2.57. Chest xray noted new nodular opactiy to the right lung - mutlifocal pneumonia. Blood gas pH 7.587, increase CO2 - pateitn hyperventilating - given ativan to calm down. Patient reported decreased chest tightness. CURB 1, PORT score 101 - recommends hospitalization. Patient placed on fluids and IV antibiotics started in ED setting. Patient admitted to [facility] - Dr. Arbutus Leas admitted patient - to be admitted to Telemetry as inpatient.  Discussed with patient and patient agreed, understood, all questions answered.         Raymon Mutton, PA-C 07/16/13 1649

## 2013-07-16 NOTE — Progress Notes (Signed)
ANTIBIOTIC CONSULT NOTE - INITIAL  Pharmacy Consult for vancomycin Indication: rule out pneumonia  Allergies  Allergen Reactions  . Enalapril Maleate     REACTION: cough  . Shellfish-Derived Products Swelling    Patient Measurements: Height: 5\' 3"  (160 cm) Weight: 208 lb 12.4 oz (94.7 kg) (scale B) IBW/kg (Calculated) : 56.9   Vital Signs: Temp: 98.1 F (36.7 C) (07/24 1229) Temp src: Oral (07/24 1229) BP: 90/72 mmHg (07/24 1229) Pulse Rate: 98 (07/24 1229) Intake/Output from previous day:   Intake/Output from this shift: Total I/O In: 1000 [I.V.:1000] Out: -   Labs:  Recent Labs  07/16/13 0851 07/16/13 0900  WBC 13.5*  --   HGB 13.5  --   PLT 183  --   CREATININE  --  1.50*   Estimated Creatinine Clearance: 46 ml/min (by C-G formula based on Cr of 1.5). No results found for this basename: VANCOTROUGH, VANCOPEAK, VANCORANDOM, GENTTROUGH, GENTPEAK, GENTRANDOM, TOBRATROUGH, TOBRAPEAK, TOBRARND, AMIKACINPEAK, AMIKACINTROU, AMIKACIN,  in the last 72 hours   Microbiology: No results found for this or any previous visit (from the past 720 hour(s)).  Medical History: Past Medical History  Diagnosis Date  . Allergic rhinitis   . COPD (chronic obstructive pulmonary disease)     mild to moderate by pfts in 2006  . HLD (hyperlipidemia)   . Impotence   . PVD (peripheral vascular disease)   . Type II or unspecified type diabetes mellitus with renal manifestations, not stated as uncontrolled(250.40)   . Renal insufficiency   . Osteoarthritis   . Cough     due to Zestril  . Encounter for long-term (current) use of aspirin   . Encounter for long-term (current) use of antiplatelets/antithrombotics   . Essential hypertension, benign   . Chronic diastolic heart failure   . Coronary atherosclerosis of native coronary artery   . Shortness of breath   . Other emphysema   . Edema   . Special screening for malignant neoplasm of prostate   . Conjunctivitis unspecified    . Hypopotassemia   . Osteoporosis, unspecified   . Secondary hyperparathyroidism (of renal origin)   . Gout, unspecified   . Pneumonia, organism unspecified   . 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 Hemiplegia affecting unspecified side, late effect of cerebrovascular disease   . Headache(784.0)   . Other and unspecified hyperlipidemia   . Nephropathy, diabetic   . Cancer 04/01/12    skin x3  . CHF (congestive heart failure) previous hx  . Stroke 04-01-12    2007, mild   left arm weakness    Medications:  Prescriptions prior to admission  Medication Sig Dispense Refill  . acarbose (PRECOSE) 25 MG tablet Take 25 mg by mouth 3 (three) times daily with meals.      Marland Kitchen albuterol (VENTOLIN HFA) 108 (90 BASE) MCG/ACT inhaler Inhale 2 puffs into the lungs every 6 (six) hours as needed.        Marland Kitchen allopurinol (ZYLOPRIM) 300 MG tablet TAKE 1 TABLET BY MOUTH DAILY  30 tablet  11  . amoxicillin-clavulanate (AUGMENTIN) 875-125 MG per tablet Take 1 tablet by mouth 2 (two) times daily.  14 tablet  0  . aspirin 81 MG tablet Take 81 mg by mouth daily.        . ASTEPRO 0.15 % SOLN SPRAY TWICE IN EACH NOSTRIL EVERY MORNING AS NEEDED  30 mL  0  . bisoprolol (ZEBETA) 5 MG  tablet Take 1 tablet (5 mg total) by mouth daily.  90 tablet  2  . budesonide-formoterol (SYMBICORT) 160-4.5 MCG/ACT inhaler Inhale 2 puffs into the lungs 2 (two) times daily.        . calcitRIOL (ROCALTROL) 0.25 MCG capsule Take 1 capsule (0.25 mcg total) by mouth daily.  90 capsule  0  . citalopram (CELEXA) 20 MG tablet Take 1 tablet (20 mg total) by mouth daily.  30 tablet  6  . ferrous sulfate 325 (65 FE) MG tablet Take 325 mg by mouth daily with breakfast.       . finasteride (PROSCAR) 5 MG tablet Take 1 tablet (5 mg total) by mouth daily.  30 tablet  4  . folic acid-pyridoxine-cyancobalamin (FOLBIC) 2.5-25-2 MG TABS Take 1 tablet by mouth daily.  90 tablet  2  .  furosemide (LASIX) 40 MG tablet Take 1 tablet (40 mg total) by mouth as directed. 80 MG IN THE AM AND 120 MG IN THE PM  150 tablet  6  . glucose blood test strip Four times a day, variable glucoses dx 250.03      . HYDROcodone-acetaminophen (NORCO) 10-325 MG per tablet Take 1 tablet by mouth every 6 (six) hours as needed for pain.  100 tablet  5  . Insulin Aspart (NOVOLOG FLEXPEN [place]) Inject 10 Units into the skin 3 (three) times daily with meals. And pen needles 3/day      . isosorbide mononitrate (IMDUR) 60 MG 24 hr tablet Take 1 tablet (60 mg total) by mouth daily.  90 tablet  3  . Multiple Vitamins-Minerals (MULTIVITAMIN PO) Take 1 tablet by mouth daily.        . nitroGLYCERIN (NITROLINGUAL) 0.4 MG/SPRAY spray Place 1 spray under the tongue as directed.        Marland Kitchen omeprazole (PRILOSEC) 20 MG capsule TAKE ONE CAPSULE BY MOUTH DAILY  90 capsule  0  . polyethylene glycol powder (MIRALAX) powder Take 17 g by mouth as needed.        . predniSONE (DELTASONE) 10 MG tablet Take 4 tabs po x 2 days, then 3 x 2 days, then 2 x 2 days, then 1 x 2 days then stop.  20 tablet  0  . Protein (UNJURY UNFLAVORED PO) Take 8 oz by mouth daily.        . rosuvastatin (CRESTOR) 10 MG tablet TAKE 1 TABLET BY MOUTH ONCE DAILY  90 tablet  3  . TAZTIA XT 180 MG 24 hr capsule TAKE ONE CAPSULE BY MOUTH DAILY  90 capsule  3  . tiotropium (SPIRIVA) 18 MCG inhalation capsule Place 18 mcg into inhaler and inhale daily.       . traZODone (DESYREL) 100 MG tablet TAKE 1 TABLET BY MOUTH EVERY NIGHT AT BEDTIME  90 tablet  0  . triamcinolone (KENALOG) 0.025 % cream APPLY THREE TIMES DAILY ON AFFECTED AREA AS NEEDED  80 g  1   Assessment: 72 yo man admitted for chills, productive cough and SOB to start broad spectrum antibiotics r/o PNA.  His SrCr is 1.5, WBC 13.5, t max 102.9  Goal of Therapy:  Vancomycin trough level 15-20 mcg/ml  Plan:  Vancomycin 1500 mg IV X1 then 750 mg IV q12 hours. F/u renal function, clinical course and  cultures.  Talbert Cage [place] 07/16/2013,1:02 PM

## 2013-07-16 NOTE — Progress Notes (Signed)
CBG 542 . Text page to DR Tat  Md returned with phone call orders received. Dr Tat made aware of elevated troponin of 0.76

## 2013-07-16 NOTE — ED Notes (Signed)
C/o SOB & chest tightness upon awakening. Albuterol 10mg  & atrovent 0.5mg  neb, solumedrol 125mg  IV & epi 0.15 IM given by EMS. Denies CP upon arrival to ED

## 2013-07-16 NOTE — ED Notes (Signed)
Lactic acid results called to [place], [place]

## 2013-07-16 NOTE — Progress Notes (Signed)
Pt arrived from ED - alert & oriented, weighed VS done and placed on telemetry monitor and oriented to room. Went over Nurse, mental health with pt.

## 2013-07-16 NOTE — Progress Notes (Signed)
Notfied by RN that patient had elevated troponin of 0.76.  Vitals stable.  Clinically without CP.  Suspect demand ischemia.  Consulted cardiology.  DTat

## 2013-07-16 NOTE — ED Notes (Addendum)
C/o chills, prod cough for white sputum, SOB, chest tightness onset this morning. Reports neb given by EMS "helped a little" with SOB & chest tightness. Saw PCP last week for same. Pt reports took NTG x1 prior to EMS arrival with no change. Chest tightness onset with SOB

## 2013-07-16 NOTE — H&P (Signed)
[facility] History and Physical  [name]. ZOX:[id number] DOB: December 14, 1941 DOA: 07/16/2013   PCP: Romero Belling, MD   Chief Complaint: dyspnea, chest pain  HPI:  72 year old male with a history of COPD (2L-at night), DM II, hyperlipidemia, chronic diastolic CHF, gastric bypass surgery (LAP-BAND) in 2012-lead in the EMS because of acute onset of chest tightness and shortness of breath. The patient states that he was in his usual state of health prior to this episode that started around 5:00 this morning. The patient had been experiencing some chills but denies any fevers. On 06/29/2013, the patient went to see his pulmonologist, Dr. Shelle Iron who placed the patient on him on prednisone taper and Augmentin. The patient finished 7 days. There was some improvement of his breathing at that time. He remained stable up until the morning of admission.  He complained of a cough with yellow sputum. Denies any hemoptysis. He denies any dizziness, syncope, visual disturbance, nausea, vomiting, diarrhea, abdominal pain, dysuria, hematuria. The patient continues to lose some weight, but he cannot quantify exactly how much. He weighs himself daily. He is compliant with all his home inhalers as well as furosemide.  In the emergency department, the patient had chest x-ray that showed nodular opacities in the right lung. WBC was 13.5, serum creatinine was 1.50. Pro BNP was 985. He had a fever of 102.71F with tachycardia. Lactic acid was 2.57. The patient was started on Levaquin. Blood cultures were obtained. The patient received 1 L normal saline. In route to the hospital, the patient received Solu-Medrol 125 mg IV, epinephrine 0.15mg  IM, 10 mg Inderal, 2.5 mg Atrovent. He states that he is breathing approximately 40% better.  Assessment/Plan: Sepsis -Secondary to pneumonia -Given the patient's presentation and recent treatment with Augmentin, I will start the patient on a broader antibiotic regimen which can  be narrowed pending his culture data and clinical response -Blood cultures have been obtained -Start Zosyn, vancomycin, Levaquin -Urine Legionella antigen, urine Streptococcus pneumoniae antigen Pneumonia -Antibiotics as discussed above -Narrow antibiotics per patient response Elevated proBNP/chronic diastolic CHF - 05/27/2007 echocardiogram shows EF 55-60% -Does not appear clinically fluid overloaded -Hold Lasix dose today given the patient's worsening of renal insufficiency -Reevaluate need to restart furosemide in the morning COPD -Continue Spiriva -No wheezing on exam -Albuterol nebulizer every 6 hours -Continue Symbicort Diabetes mellitus type 2 -Hemoglobin A1c 7.0 on 05/26/2013 -NovoLog sliding scale -Continue Precose Hyperlipidemia -Continue statin Lower extremity edema/pain -Venous Dopplers r/o DVT Constipation -Start Colace and Senokot Chest pain -cycle troponins -EKG shows sinus tachycardia with nonspecific ST-T wave changes      Past Medical History  Diagnosis Date  . Allergic rhinitis   . COPD (chronic obstructive pulmonary disease)     mild to moderate by pfts in 2006  . HLD (hyperlipidemia)   . Impotence   . PVD (peripheral vascular disease)   . Type II or unspecified type diabetes mellitus with renal manifestations, not stated as uncontrolled(250.40)   . Renal insufficiency   . Osteoarthritis   . Cough     due to Zestril  . Encounter for long-term (current) use of aspirin   . Encounter for long-term (current) use of antiplatelets/antithrombotics   . Essential hypertension, benign   . Chronic diastolic heart failure   . Coronary atherosclerosis of native coronary artery   . Shortness of breath   . Other emphysema   . Edema   . Special screening for malignant neoplasm of prostate   . Conjunctivitis unspecified   .  Hypopotassemia   . Osteoporosis, unspecified   . Secondary hyperparathyroidism (of renal origin)   . Gout, unspecified   .  Pneumonia, organism unspecified   . 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 Hemiplegia affecting unspecified side, late effect of cerebrovascular disease   . Headache(784.0)   . Other and unspecified hyperlipidemia   . Nephropathy, diabetic   . Cancer 04/01/12    skin x3  . CHF (congestive heart failure) previous hx  . Stroke 04-01-12    2007, mild   left arm weakness   Past Surgical History  Procedure Laterality Date  . Nasal sinus surgery    . Esophagogastroduodenoscopy    . Cardiac catheterization  01/18/2005  . Abdominal surgery    . Laparoscopic gastric banding  05/29/2011   Social History:  reports that he quit smoking about 15 years ago. His smoking use included Cigarettes. He has a 82 pack-year smoking history. He has quit using smokeless tobacco. He reports that he does not drink alcohol or use illicit drugs.   Family History  Problem Relation Age of Onset  . Hypertension      family history of htn  . Heart disease Maternal Aunt   . Lung cancer Mother   . Cancer Mother     Lung Cancer  . Heart disease Father     CHF     Allergies  Allergen Reactions  . Enalapril Maleate     REACTION: cough  . Shellfish-Derived Products Swelling      Prior to Admission medications   Medication Sig Start Date End Date Taking? Authorizing Provider  acarbose (PRECOSE) 25 MG tablet Take 25 mg by mouth 3 (three) times daily with meals.   Yes Historical Provider, MD  albuterol (VENTOLIN HFA) 108 (90 BASE) MCG/ACT inhaler Inhale 2 puffs into the lungs every 6 (six) hours as needed.     Yes Historical Provider, MD  allopurinol (ZYLOPRIM) 300 MG tablet TAKE 1 TABLET BY MOUTH DAILY 09/24/12  Yes Romero Belling, MD  amoxicillin-clavulanate (AUGMENTIN) 875-125 MG per tablet Take 1 tablet by mouth 2 (two) times daily. 06/29/13  Yes Barbaraann Share, MD  aspirin 81 MG tablet Take 81 mg by mouth daily.     Yes Historical Provider, MD   ASTEPRO 0.15 % SOLN SPRAY TWICE IN EACH NOSTRIL EVERY MORNING AS NEEDED 06/10/13  Yes Romero Belling, MD  bisoprolol (ZEBETA) 5 MG tablet Take 1 tablet (5 mg total) by mouth daily. 06/10/13  Yes Romero Belling, MD  budesonide-formoterol [facility]) 160-4.5 MCG/ACT inhaler Inhale 2 puffs into the lungs 2 (two) times daily.     Yes Historical Provider, MD  calcitRIOL (ROCALTROL) 0.25 MCG capsule Take 1 capsule (0.25 mcg total) by mouth daily. 07/10/13  Yes Romero Belling, MD  citalopram (CELEXA) 20 MG tablet Take 1 tablet (20 mg total) by mouth daily. 12/30/12 12/30/13 Yes Romero Belling, MD  ferrous sulfate 325 (65 FE) MG tablet Take 325 mg by mouth daily with breakfast.    Yes Historical Provider, MD  finasteride (PROSCAR) 5 MG tablet Take 1 tablet (5 mg total) by mouth daily. 04/24/13  Yes Romero Belling, MD  folic acid-pyridoxine-cyancobalamin (FOLBIC) 2.5-25-2 MG TABS Take 1 tablet by mouth daily. 06/15/13  Yes Romero Belling, MD  furosemide (LASIX) 40 MG tablet Take 1 tablet (40 mg total) by mouth as directed. 80 MG IN THE AM AND 120 MG IN THE PM 03/19/13  Yes Nile Dear  McAlhany, MD  glucose blood test strip Four times a day, variable glucoses dx 250.03   Yes Historical Provider, MD  HYDROcodone-acetaminophen (NORCO) 10-325 MG per tablet Take 1 tablet by mouth every 6 (six) hours as needed for pain. 02/24/13  Yes Romero Belling, MD  Insulin Aspart (NOVOLOG FLEXPEN [place]) Inject 10 Units into the skin 3 (three) times daily with meals. And pen needles 3/day   Yes Historical Provider, MD  isosorbide mononitrate (IMDUR) 60 MG 24 hr tablet Take 1 tablet (60 mg total) by mouth daily. 10/02/12  Yes Kathleene Hazel, MD  Multiple Vitamins-Minerals (MULTIVITAMIN PO) Take 1 tablet by mouth daily.     Yes Historical Provider, MD  nitroGLYCERIN (NITROLINGUAL) 0.4 MG/SPRAY spray Place 1 spray under the tongue as directed.     Yes Historical Provider, MD  omeprazole (PRILOSEC) 20 MG capsule TAKE ONE CAPSULE BY MOUTH DAILY 06/23/13   Yes Romero Belling, MD  polyethylene glycol powder (MIRALAX) powder Take 17 g by mouth as needed.     Yes Historical Provider, MD  predniSONE (DELTASONE) 10 MG tablet Take 4 tabs po x 2 days, then 3 x 2 days, then 2 x 2 days, then 1 x 2 days then stop. 06/29/13  Yes Barbaraann Share, MD  Protein Corliss Marcus UNFLAVORED PO) Take 8 oz by mouth daily.     Yes Historical Provider, MD  rosuvastatin (CRESTOR) 10 MG tablet TAKE 1 TABLET BY MOUTH ONCE DAILY 02/23/13  Yes Romero Belling, MD  TAZTIA XT 180 MG 24 hr capsule TAKE ONE CAPSULE BY MOUTH DAILY 09/28/12  Yes Romero Belling, MD  tiotropium (SPIRIVA) 18 MCG inhalation capsule Place 18 mcg into inhaler and inhale daily.    Yes Historical Provider, MD  traZODone (DESYREL) 100 MG tablet TAKE 1 TABLET BY MOUTH EVERY NIGHT AT BEDTIME 06/10/13  Yes Romero Belling, MD  triamcinolone (KENALOG) 0.025 % cream APPLY THREE TIMES DAILY ON AFFECTED AREA AS NEEDED 01/07/12  Yes Romero Belling, MD    Review of Systems:  Constitutional:  No weight loss, night sweats,fatigue.  Head&Eyes: No headache.  No vision loss.  No eye pain or scotoma ENT:  No Difficulty swallowing,Tooth/dental problems,Sore throat,   Cardio-vascular:  No PND, swelling in lower extremities,  dizziness, palpitations  GI:  No  abdominal pain, nausea, vomiting, diarrhea, loss of appetite, hematochezia, melena, heartburn, indigestion, Resp:   No coughing up of blood .No wheezing.No chest wall deformity  Skin:  no rash or lesions.  GU:  no dysuria, change in color of urine, no urgency or frequency. No flank pain.  Musculoskeletal:  No joint pain or swelling. No decreased range of motion. No back pain.  Psych:  No change in mood or affect. No depression or anxiety. Neurologic: No headache, no dysesthesia, no focal weakness, no vision loss. No syncope  Physical Exam: Filed Vitals:   07/16/13 0945 07/16/13 1030 07/16/13 1104 07/16/13 1157  BP: 143/46 152/59 144/54 114/73  Pulse: 101 99 100 97  Temp:  100.9 F (38.3 C)  100.6 F (38.1 C) 99.9 F (37.7 C)  TempSrc: Oral  Oral Oral  Resp: 24 22 20 20   Height:      Weight:      SpO2: 97% 97% 97% 98%   General:  A&O x 3, NAD, nontoxic, pleasant/cooperative Head/Eye: No conjunctival hemorrhage, no icterus, [place]/AT, No nystagmus ENT:  No icterus,  No thrush, good dentition, no pharyngeal exudate Neck:  No masses, no lymphadenpathy, no bruits CV:  RRR, no  rub, no gallop, no S3 Lung:  CTAB, good air movement, no wheeze, no rhonchi Abdomen: soft/NT, +BS, nondistended, no peritoneal signs Ext: No cyanosis, No rashes, No petechiae, No lymphangitis, No edema Neuro: CNII-XII intact, strength 4/5 in bilateral upper and lower extremities, no dysmetria  Labs on Admission:  Basic Metabolic Panel:  Recent Labs Lab 07/16/13 0900  NA 136  K 4.1  CL 97  CO2 26  GLUCOSE 294*  BUN 40*  CREATININE 1.50*  CALCIUM 9.2   Liver Function Tests: No results found for this basename: AST, ALT, ALKPHOS, BILITOT, PROT, ALBUMIN,  in the last 168 hours No results found for this basename: LIPASE, AMYLASE,  in the last 168 hours No results found for this basename: AMMONIA,  in the last 168 hours CBC:  Recent Labs Lab 07/16/13 0851  WBC 13.5*  NEUTROABS 12.2*  HGB 13.5  HCT 39.8  MCV 88.2  PLT 183   Cardiac Enzymes: No results found for this basename: CKTOTAL, CKMB, CKMBINDEX, TROPONINI,  in the last 168 hours BNP: No components found with this basename: POCBNP,  CBG:  Recent Labs Lab 07/16/13 0835  GLUCAP 288*    Radiological Exams on Admission: Dg Chest Portable 1 View  07/16/2013   *RADIOLOGY REPORT*  Clinical Data: Right chest pain with shortness of breath.  History of COPD, diabetes and congestive heart failure.  PORTABLE CHEST - 1 VIEW  Comparison: CT and radiographs 10/21/2011.  Findings: 0842 hours.  There is stable cardiomegaly and aortic atherosclerosis.  There are new patchy asymmetric air space opacities in the right lung with  some nodular components.  The left lung is clear.  There is no definite edema or significant pleural effusion.  Osseous structures appear unchanged.  IMPRESSION: New asymmetric nodular opacities in the right lung, probably reflecting multifocal pneumonia.  Radiographic followup is necessary to document clearing and exclude an underlying mass lesion.   Original Report Authenticated By: Carey Bullocks, M.D.    EKG: Independently reviewed. Sinus tach with nonsp. ST-T change    Time spent:70 minutes Code Status:   full Family Communication:   wife at bedside   Rosaland Shiffman, DO  [facility] Pager [phone]  If 7PM-7AM, please contact night-coverage [email or web address] Password [facility] 07/16/2013, 12:01 PM

## 2013-07-16 NOTE — Progress Notes (Signed)
VASCULAR LAB PRELIMINARY  PRELIMINARY  PRELIMINARY  PRELIMINARY  Bilateral lower extremity venous Dopplers completed.    Preliminary report:  There is no DVT or SVT noted in the bilateral lower extremities.    Zehava Turski, RVT 07/16/2013, 1:49 PM

## 2013-07-17 DIAGNOSIS — I214 Non-ST elevation (NSTEMI) myocardial infarction: Secondary | ICD-10-CM | POA: Diagnosis not present

## 2013-07-17 DIAGNOSIS — F329 Major depressive disorder, single episode, unspecified: Secondary | ICD-10-CM

## 2013-07-17 DIAGNOSIS — R109 Unspecified abdominal pain: Secondary | ICD-10-CM

## 2013-07-17 DIAGNOSIS — I5032 Chronic diastolic (congestive) heart failure: Secondary | ICD-10-CM | POA: Diagnosis not present

## 2013-07-17 DIAGNOSIS — K219 Gastro-esophageal reflux disease without esophagitis: Secondary | ICD-10-CM

## 2013-07-17 DIAGNOSIS — E1129 Type 2 diabetes mellitus with other diabetic kidney complication: Secondary | ICD-10-CM

## 2013-07-17 DIAGNOSIS — J189 Pneumonia, unspecified organism: Secondary | ICD-10-CM | POA: Diagnosis not present

## 2013-07-17 LAB — BASIC METABOLIC PANEL
Calcium: 8.9 mg/dL (ref 8.4–10.5)
Chloride: 98 mEq/L (ref 96–112)
Creatinine, Ser: 1.45 mg/dL — ABNORMAL HIGH (ref 0.50–1.35)
GFR calc Af Amer: 54 mL/min — ABNORMAL LOW (ref >90)
Glucose, Bld: 322 mg/dL — ABNORMAL HIGH (ref 70–99)
Potassium: 4.3 mEq/L (ref 3.5–5.1)

## 2013-07-17 LAB — GLUCOSE, CAPILLARY
Glucose-Capillary: 273 mg/dL — ABNORMAL HIGH (ref 70–99)
Glucose-Capillary: 451 mg/dL — ABNORMAL HIGH (ref 70–99)
Glucose-Capillary: 547 mg/dL — ABNORMAL HIGH (ref 70–99)
Glucose-Capillary: 379 mg/dL — ABNORMAL HIGH (ref 70–99)

## 2013-07-17 LAB — LEGIONELLA ANTIGEN, URINE

## 2013-07-17 LAB — CBC
Platelets: 184 10*3/uL (ref 150–400)
RBC: 4.04 MIL/uL — ABNORMAL LOW (ref 4.22–5.81)
RDW: 15 % (ref 11.5–15.5)
WBC: 15.6 10*3/uL — ABNORMAL HIGH (ref 4.0–10.5)

## 2013-07-17 LAB — TROPONIN I: Troponin I: 1.77 ng/mL (ref <0.30)

## 2013-07-17 MED ORDER — ENOXAPARIN SODIUM 100 MG/ML ~~LOC~~ SOLN
95.0000 mg | Freq: Two times a day (BID) | SUBCUTANEOUS | Status: DC
  Administered 2013-07-17 – 2013-07-19 (×6): 95 mg via SUBCUTANEOUS
  Filled 2013-07-17 (×9): qty 1

## 2013-07-17 MED ORDER — AZELASTINE HCL 0.1 % NA SOLN
2.0000 | Freq: Two times a day (BID) | NASAL | Status: DC | PRN
  Administered 2013-07-17 – 2013-07-21 (×2): 2 via NASAL
  Filled 2013-07-17 (×2): qty 30

## 2013-07-17 MED ORDER — INSULIN DETEMIR 100 UNIT/ML ~~LOC~~ SOLN
5.0000 [IU] | Freq: Every day | SUBCUTANEOUS | Status: DC
  Administered 2013-07-17 – 2013-07-22 (×6): 5 [IU] via SUBCUTANEOUS
  Filled 2013-07-17 (×7): qty 0.05

## 2013-07-17 MED ORDER — FUROSEMIDE 40 MG PO TABS
40.0000 mg | ORAL_TABLET | Freq: Two times a day (BID) | ORAL | Status: DC
  Administered 2013-07-17 – 2013-07-18 (×2): 40 mg via ORAL
  Filled 2013-07-17 (×4): qty 1

## 2013-07-17 MED ORDER — INSULIN ASPART 100 UNIT/ML ~~LOC~~ SOLN
0.0000 [IU] | Freq: Every day | SUBCUTANEOUS | Status: DC
  Administered 2013-07-17: 4 [IU] via SUBCUTANEOUS
  Administered 2013-07-18: 3 [IU] via SUBCUTANEOUS
  Administered 2013-07-19 – 2013-07-20 (×2): 2 [IU] via SUBCUTANEOUS
  Administered 2013-07-21: 4 [IU] via SUBCUTANEOUS
  Administered 2013-07-22: 22:00:00 via SUBCUTANEOUS

## 2013-07-17 MED ORDER — INSULIN ASPART 100 UNIT/ML ~~LOC~~ SOLN
12.0000 [IU] | Freq: Once | SUBCUTANEOUS | Status: AC
  Administered 2013-07-17: 12 [IU] via SUBCUTANEOUS

## 2013-07-17 MED ORDER — FUROSEMIDE 10 MG/ML IJ SOLN
40.0000 mg | Freq: Once | INTRAMUSCULAR | Status: AC
  Administered 2013-07-17: 40 mg via INTRAVENOUS

## 2013-07-17 MED ORDER — INSULIN ASPART 100 UNIT/ML ~~LOC~~ SOLN: 8.0000 [IU] | Freq: Once | SUBCUTANEOUS | Status: DC

## 2013-07-17 MED ORDER — INSULIN ASPART 100 UNIT/ML ~~LOC~~ SOLN
0.0000 [IU] | Freq: Three times a day (TID) | SUBCUTANEOUS | Status: DC
  Administered 2013-07-17: 11 [IU] via SUBCUTANEOUS
  Administered 2013-07-18: 5 [IU] via SUBCUTANEOUS
  Administered 2013-07-18: 11 [IU] via SUBCUTANEOUS
  Administered 2013-07-18: 3 [IU] via SUBCUTANEOUS
  Administered 2013-07-19: 5 [IU] via SUBCUTANEOUS
  Administered 2013-07-19: 3 [IU] via SUBCUTANEOUS
  Administered 2013-07-19: 5 [IU] via SUBCUTANEOUS
  Administered 2013-07-20: 3 [IU] via SUBCUTANEOUS
  Administered 2013-07-20: 5 [IU] via SUBCUTANEOUS
  Administered 2013-07-21: 11 [IU] via SUBCUTANEOUS
  Administered 2013-07-21 – 2013-07-22 (×2): 3 [IU] via SUBCUTANEOUS
  Administered 2013-07-22: 8 [IU] via SUBCUTANEOUS
  Administered 2013-07-22: 3 [IU] via SUBCUTANEOUS
  Administered 2013-07-23: 11 [IU] via SUBCUTANEOUS

## 2013-07-17 MED ORDER — CLOPIDOGREL BISULFATE 300 MG PO TABS
300.0000 mg | ORAL_TABLET | Freq: Once | ORAL | Status: AC
  Administered 2013-07-17: 300 mg via ORAL
  Filled 2013-07-17: qty 1

## 2013-07-17 MED ORDER — CLOPIDOGREL BISULFATE 75 MG PO TABS
75.0000 mg | ORAL_TABLET | Freq: Every day | ORAL | Status: DC
  Administered 2013-07-18 – 2013-07-23 (×7): 75 mg via ORAL
  Filled 2013-07-17 (×8): qty 1

## 2013-07-17 NOTE — Progress Notes (Signed)
Shift event: Pt admitted earlier with PNA and COPD. Hx CAD and MI in 1985. Last cardiac cath ? 2006. First troponin elevated at .78 and Dr. Jens Som, cardio, consulted today. At that time, cardio thought elevated troponin could be demand ischemia secondary to PNA/acute illness. 2nd troponin is 2.91. This NP followed recommendations of cardio consult to start Lovenox treatment doses in case of rising troponins. Pt is already on a statin and ASA. Dr. Jens Som mentioned beta blocker in note, but pt isn't on one. This maybe due to COPD. R/p stat 12 lead EKG ordered, but hasn't been done as of this note. Pt is having NO chest pain.  This NP called Dr. Terressa Koyanagi, cardio fellow, and related the above information. Agreed with starting the Lovenox, continuing statin and ASA. OK to hold BB for now and possibly start in am depending on pt's recovery/resp status.  Cardio note also said to consider outpt stress myoview after recovery from PNA. Discussed possible change of plan given new findings and cardio fellow will relate events to attending cardiologist this am.  Will report to [facility] this am as well.  Jimmye Norman, NP [facility]

## 2013-07-17 NOTE — Progress Notes (Signed)
[facility] K Craige Cotta made aware ofElevated troponin. No chest pain nor SOB noted. VSS.   EKG done as ordered. Continued to monitor patient closely.

## 2013-07-17 NOTE — Progress Notes (Signed)
Pt up in bed . No complaints of SOB or pain. Pt on 2l o2

## 2013-07-17 NOTE — Progress Notes (Signed)
Pt states being SOB . Pt not in distress. Crackles in lower lobes. Pt w/o lasix for 2 days.  MD order 40 IV lasix once. Will continue to monitor

## 2013-07-17 NOTE — Progress Notes (Signed)
    Subjective:  Had some trouble breathing this morning. No chest pain, but a 'fullness' in the chest. Appetite is ok. No other complaints.  Objective:  Vital Signs in the last 24 hours: Temp:  [98.1 F (36.7 C)-100.9 F (38.3 C)] 98.1 F (36.7 C) (07/25 0539) Pulse Rate:  [66-101] 66 (07/25 0539) Resp:  [18-24] 18 (07/25 0539) BP: (90-152)/(46-73) 150/53 mmHg (07/25 0539) SpO2:  [97 %-100 %] 100 % (07/25 0539) Weight:  [94.7 kg (208 lb 12.4 oz)-95.437 kg (210 lb 6.4 oz)] 95.437 kg (210 lb 6.4 oz) (07/25 0539)  Intake/Output from previous day: 07/24 0701 - 07/25 0700 In: 2790 [P.O.:840; I.V.:1000; IV Piggyback:950] Out: 1452 [Urine:1451; Stool:1]  Physical Exam: Pt is alert and oriented, NAD HEENT: normal Neck: JVP - normal, carotids 2+= without bruits Lungs: decreased BS on right CV: RRR without murmur or gallop Abd: soft, NT, Positive BS, no hepatomegaly Ext: no C/C/E, distal pulses intact and equal Skin: warm/dry no rash   Lab Results:  Recent Labs  07/16/13 0851 07/17/13 0505  WBC 13.5* 15.6*  HGB 13.5 11.8*  PLT 183 184    Recent Labs  07/16/13 0900 07/17/13 0505  NA 136 135  K 4.1 4.3  CL 97 98  CO2 26 27  GLUCOSE 294* 322*  BUN 40* 39*  CREATININE 1.50* 1.45*    Recent Labs  07/16/13 1457 07/16/13 2009  TROPONINI 0.76* 2.91*    Cardiac Studies: none  CXR: IMPRESSION:  New asymmetric nodular opacities in the right lung, probably  reflecting multifocal pneumonia. Radiographic followup is  necessary to document clearing and exclude an underlying mass  lesion.  Tele: Sinus rhythm/sinus tach  EKG: Sinus tach, PVC, no significant ST-T changes  Assessment/Plan:  1. NSTEMI, Type 2 versus true ACS. Clearly has pneumonia with 103 degree fever and CXR findings. For now will continue lovenox (therapeutic dose), ASA, start plavix. Tentatively plan cardiac cath on Monday if he is afebrile and progressing well. He is on a beta-blocker and  statin. Repeat cardiac markers to make sure not continuing to trend up, but he appears comfortable without CP at present.  2. Acute on chronic diastolic heart failure - needs to start back on lasix. On high-doses as outpatient.  3. Pneumonia - per primary team.   Tonny Bollman, M.D. 07/17/2013, 8:48 AM

## 2013-07-17 NOTE — Progress Notes (Addendum)
Inpatient Diabetes Program Recommendations  AACE/ADA: New Consensus Statement on Inpatient Glycemic Control (2013)  Target Ranges:  Prepandial:   less than 140 mg/dL      Peak postprandial:   less than 180 mg/dL (1-2 hours)      Critically ill patients:  140 - 180 mg/dL   Reason for Assessment:  Sub-optimal glycemic control.  On predisone taper prior to admission.   Inpatient Diabetes Program Recommendations Insulin - Basal: May benefit from addition of Lantus 20 units daily or at HS temporarily until glycemic control improves. Correction (SSI): Currently receiving sensitive correction.  May benefit from increase to moderate correction scale. Insulin - Meal Coverage: Patient was on a prednisone taper prior to admission which would make him CHO intollerant.  May consider meal coverage if intake improves.  HgbA1C: Hbg A1C 7.0 on 05/26/2013 Diet: CHO Modified Medium  Note:  Results for RIVAAN, KENDALL (MRN [id number]) as of 07/17/2013 12:25  Ref. Range 07/16/2013 08:35 07/16/2013 16:27 07/16/2013 20:50 07/16/2013 22:42 07/17/2013 00:40 07/17/2013 02:01 07/17/2013 03:05 07/17/2013 06:22 07/17/2013 11:03  Glucose-Capillary Latest Range: 70-99 mg/dL 409 (H) 811 (H) 914 (H) 451 (H) 547 (H) 463 (H) 439 (H) 273 (H) 379 (H)   Thank you.  Memori Sammon S. Elsie Lincoln, RN, CNS, CDE Inpatient Diabetes Program, team pager [phone]   07/17/2013 Please consider stopping Precose while in hospital.  Patient not eating well-- 45% at breakfast.  Thank you.  Meril Dray S. Elsie Lincoln, RN, CNS, CDE Inpatient Diabetes Program, team pager [phone]

## 2013-07-17 NOTE — Progress Notes (Signed)
Pt' troponin elevated 2.9. Pt asymptomatic , no chest pain nor sob. Pt seen and evaluated by [place] [facility] on admission and aware of elevated troponin since previous shift. Will continue to monitor patient.

## 2013-07-17 NOTE — Progress Notes (Signed)
Utilization Review Completed Tobias Avitabile J. Iyonna Rish, RN, BSN, NCM [phone]  

## 2013-07-17 NOTE — Progress Notes (Signed)
Page MD to Request to up insulin dosing d/t cbg 250-350s

## 2013-07-17 NOTE — Progress Notes (Signed)
[facility] PROGRESS NOTE  [name]. ZOX:[id number] DOB: [birth year] DOA: 07/16/2013 PCP: Romero Belling, MD  Assessment/Plan:  Sepsis  -Secondary to pneumonia  -Given the patient's presentation and recent treatment with Augmentin, I will start the patient on a broader antibiotic regimen which can be narrowed pending his culture data and clinical response  -Blood cultures have been obtained and pending -Continue Zosyn, vancomycin, Levaquin  -Urine Legionella antigen, urine Streptococcus pneumoniae antigen both negative  Pneumonia  -Antibiotics as discussed above  -Narrow antibiotics per patient response   Elevated proBNP/chronic diastolic CHF  - 05/27/2007 echocardiogram shows EF 55-60%  -Does not appear clinically fluid overloaded  -Restart lasix at lower dose.  -Reevaluate need to restart furosemide in the morning   COPD  -Continue Spiriva  -No wheezing on exam  -Albuterol nebulizer every 6 hours  -Continue Symbicort   Diabetes mellitus type 2  -Hemoglobin A1c 7.0 on 05/26/2013  -NovoLog sliding scale  -Continue Precose   Hyperlipidemia  -Continue statin   Lower extremity edema/pain  -Venous Dopplers with no evidence of deep vein or superficial thrombosis Constipation  -Continue Colace and Senokot   Chest pain  - Cardiology on board - NSTEMI, type 2 vs true ACS: Cardiology considering Cath on Monday.  Code Status: full Family Communication: no family at bedside.  Disposition Plan: Pending improvement in condition.  Consultants:  Cardiology: Excell Seltzer  Procedures:  Lower extremity venous duplex  Antibiotics:  Levaquin  Zosyn  Vancomycin  HPI/Subjective: Patient has no new complaints today.  Denies any chest pain  Objective: Filed Vitals:   07/17/13 0539 07/17/13 1021 07/17/13 1042 07/17/13 1430  BP: 150/53 140/37 141/48 133/44  Pulse: 66 76  71  Temp: 98.1 F (36.7 C) 97.9 F (36.6 C)  98 F (36.7 C)  TempSrc: Oral Oral  Oral   Resp: 18 18  18   Height:      Weight: 95.437 kg (210 lb 6.4 oz)     SpO2: 100% 98%  100%    Intake/Output Summary (Last 24 hours) at 07/17/13 1536 Last data filed at 07/17/13 1308  Gross per 24 hour  Intake   1810 ml  Output   1452 ml  Net    358 ml   Filed Weights   07/16/13 0748 07/16/13 1229 07/17/13 0539  Weight: 94.802 kg (209 lb) 94.7 kg (208 lb 12.4 oz) 95.437 kg (210 lb 6.4 oz)    Exam:   General:  Pt in NAd, Alert and awake  Cardiovascular: RRR, no rubs or gallops  Respiratory: prolonged expiratory phase, rhales, no wheezes  Abdomen: soft, NT, ND  Musculoskeletal: no cyanosis or clubbing   Data Reviewed: Basic Metabolic Panel:  Recent Labs Lab 07/16/13 0900 07/17/13 0505  NA 136 135  K 4.1 4.3  CL 97 98  CO2 26 27  GLUCOSE 294* 322*  BUN 40* 39*  CREATININE 1.50* 1.45*  CALCIUM 9.2 8.9   Liver Function Tests: No results found for this basename: AST, ALT, ALKPHOS, BILITOT, PROT, ALBUMIN,  in the last 168 hours No results found for this basename: LIPASE, AMYLASE,  in the last 168 hours No results found for this basename: AMMONIA,  in the last 168 hours CBC:  Recent Labs Lab 07/16/13 0851 07/17/13 0505  WBC 13.5* 15.6*  NEUTROABS 12.2*  --   HGB 13.5 11.8*  HCT 39.8 35.6*  MCV 88.2 88.1  PLT 183 184   Cardiac Enzymes:  Recent Labs Lab 07/16/13 1457 07/16/13 2009  TROPONINI 0.76*  2.91*   BNP (last 3 results)  Recent Labs  07/16/13 0900  PROBNP 985.5*   CBG:  Recent Labs Lab 07/17/13 0040 07/17/13 0201 07/17/13 0305 07/17/13 0622 07/17/13 1103  GLUCAP 547* 463* 439* 273* 379*    Recent Results (from the past 240 hour(s))  CULTURE, BLOOD (ROUTINE X 2)     Status: None   Collection Time    07/16/13 10:20 AM      Result Value Range Status   Specimen Description BLOOD HAND RIGHT   Final   Special Requests BOTTLES DRAWN AEROBIC AND ANAEROBIC 10CC   Final   Culture  Setup Time 07/16/2013 16:52   Final   Culture      Final   Value:        BLOOD CULTURE RECEIVED NO GROWTH TO DATE CULTURE WILL BE HELD FOR 5 DAYS BEFORE ISSUING A FINAL NEGATIVE REPORT   Report Status PENDING   Incomplete  CULTURE, BLOOD (ROUTINE X 2)     Status: None   Collection Time    07/16/13 10:30 AM      Result Value Range Status   Specimen Description BLOOD ARM RIGHT   Final   Special Requests BOTTLES DRAWN AEROBIC ONLY 5.0CC   Final   Culture  Setup Time 07/16/2013 16:53   Final   Culture     Final   Value:        BLOOD CULTURE RECEIVED NO GROWTH TO DATE CULTURE WILL BE HELD FOR 5 DAYS BEFORE ISSUING A FINAL NEGATIVE REPORT   Report Status PENDING   Incomplete     Studies: Dg Chest Portable 1 View  07/16/2013   *RADIOLOGY REPORT*  Clinical Data: Right chest pain with shortness of breath.  History of COPD, diabetes and congestive heart failure.  PORTABLE CHEST - 1 VIEW  Comparison: CT and radiographs 10/21/2011.  Findings: 0842 hours.  There is stable cardiomegaly and aortic atherosclerosis.  There are new patchy asymmetric air space opacities in the right lung with some nodular components.  The left lung is clear.  There is no definite edema or significant pleural effusion.  Osseous structures appear unchanged.  IMPRESSION: New asymmetric nodular opacities in the right lung, probably reflecting multifocal pneumonia.  Radiographic followup is necessary to document clearing and exclude an underlying mass lesion.   Original Report Authenticated By: Carey Bullocks, M.D.    Scheduled Meds: . sodium chloride   Intravenous Once  . acarbose  25 mg Oral TID WC  . albuterol  2.5 mg Nebulization Q6H  . allopurinol  150 mg Oral Daily  . aspirin  81 mg Oral Daily  . atorvastatin  20 mg Oral q1800  . bisoprolol  5 mg Oral Daily  . budesonide-formoterol  2 puff Inhalation BID  . calcitRIOL  0.25 mcg Oral Daily  . citalopram  20 mg Oral Daily  . [START ON 07/18/2013] clopidogrel  75 mg Oral Q breakfast  . diltiazem  180 mg Oral Daily  .  docusate sodium  100 mg Oral BID  . enoxaparin (LOVENOX) injection  95 mg Subcutaneous Q12H  . ferrous sulfate  325 mg Oral Q breakfast  . finasteride  5 mg Oral Daily  . folic acid-pyridoxine-cyancobalamin  1 tablet Oral Daily  . insulin aspart  0-9 Units Subcutaneous TID WC  . isosorbide mononitrate  60 mg Oral Daily  . [START ON 07/18/2013] levofloxacin  750 mg Oral Q48H  . pantoprazole  40 mg Oral Daily  . piperacillin-tazobactam  3.375 g Intravenous Q8H  . senna  1 tablet Oral Daily  . sodium chloride  3 mL Intravenous Q12H  . tiotropium  18 mcg Inhalation Daily  . traZODone  100 mg Oral QHS  . vancomycin  750 mg Intravenous Q12H   Continuous Infusions:   Active Problems:   ANEMIA, IRON DEFICIENCY   DIASTOLIC HEART FAILURE, CHRONIC   COPD   DM (diabetes mellitus)   Sepsis   Elevated troponin   NSTEMI (non-ST elevated myocardial infarction)    Time spent: > 35 minutes    Penny Pia  [facility] Pager [phone]. If 7PM-7AM, please contact night-coverage at [email or web address], password [facility] 07/17/2013, 3:36 PM  LOS: 1 day

## 2013-07-17 NOTE — Progress Notes (Signed)
Patient evaluated for community based chronic disease management services with [facility] Care Management Program as a benefit of patient's [employer].  Spoke with patient at bedside to explain [facility] Care Management services.  He and his wife self manage and therefore decline at this time.  Left contact information and [facility] literature at bedside. Made inpatient Case Manager aware that [facility] Care Management following. Of note, [facility] Care Management services does not replace or interfere with any services that are arranged by inpatient case management or social work.  For additional questions or referrals please contact Anibal Henderson BSN RN [facility] Liaison at [phone].

## 2013-07-17 NOTE — Progress Notes (Signed)
BS still high K.Kirby NP notified with orders to give 12 units novolog which was given.will continue to monitor.
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
## 2013-07-17 NOTE — Progress Notes (Signed)
[facility] Donnamarie Poag made aware of Patients elevated blood sugar 488. Orders received and carried out.the patient non compliant. After 8 units of novolog was given pt took 4 oz of milk without the knowledge of this RN. BS was elevated upon rechecking 547.

## 2013-07-18 ENCOUNTER — Inpatient Hospital Stay (HOSPITAL_COMMUNITY): Payer: Medicare Other

## 2013-07-18 DIAGNOSIS — J449 Chronic obstructive pulmonary disease, unspecified: Secondary | ICD-10-CM

## 2013-07-18 DIAGNOSIS — I5032 Chronic diastolic (congestive) heart failure: Secondary | ICD-10-CM | POA: Diagnosis not present

## 2013-07-18 DIAGNOSIS — I2510 Atherosclerotic heart disease of native coronary artery without angina pectoris: Secondary | ICD-10-CM

## 2013-07-18 DIAGNOSIS — J189 Pneumonia, unspecified organism: Secondary | ICD-10-CM | POA: Diagnosis not present

## 2013-07-18 DIAGNOSIS — I214 Non-ST elevation (NSTEMI) myocardial infarction: Secondary | ICD-10-CM | POA: Diagnosis not present

## 2013-07-18 DIAGNOSIS — I509 Heart failure, unspecified: Secondary | ICD-10-CM | POA: Diagnosis not present

## 2013-07-18 DIAGNOSIS — I10 Essential (primary) hypertension: Secondary | ICD-10-CM

## 2013-07-18 DIAGNOSIS — I359 Nonrheumatic aortic valve disorder, unspecified: Secondary | ICD-10-CM

## 2013-07-18 DIAGNOSIS — J438 Other emphysema: Secondary | ICD-10-CM | POA: Diagnosis not present

## 2013-07-18 LAB — GLUCOSE, CAPILLARY
Glucose-Capillary: 186 mg/dL — ABNORMAL HIGH (ref 70–99)
Glucose-Capillary: 250 mg/dL — ABNORMAL HIGH (ref 70–99)

## 2013-07-18 LAB — BASIC METABOLIC PANEL
BUN: 38 mg/dL — ABNORMAL HIGH (ref 6–23)
Creatinine, Ser: 1.52 mg/dL — ABNORMAL HIGH (ref 0.50–1.35)
GFR calc Af Amer: 51 mL/min — ABNORMAL LOW (ref >90)
GFR calc non Af Amer: 44 mL/min — ABNORMAL LOW (ref >90)

## 2013-07-18 LAB — CBC
HCT: 34.1 % — ABNORMAL LOW (ref 39.0–52.0)
MCH: 30.3 pg (ref 26.0–34.0)
MCHC: 34.6 g/dL (ref 30.0–36.0)
MCV: 87.7 fL (ref 78.0–100.0)
RDW: 15.2 % (ref 11.5–15.5)

## 2013-07-18 MED ORDER — FUROSEMIDE 10 MG/ML IJ SOLN
40.0000 mg | Freq: Once | INTRAMUSCULAR | Status: AC
  Administered 2013-07-18: 40 mg via INTRAVENOUS

## 2013-07-18 MED ORDER — FUROSEMIDE 80 MG PO TABS
120.0000 mg | ORAL_TABLET | Freq: Every evening | ORAL | Status: DC
  Administered 2013-07-18 – 2013-07-22 (×5): 120 mg via ORAL
  Filled 2013-07-18 (×6): qty 1

## 2013-07-18 MED ORDER — FUROSEMIDE 80 MG PO TABS
80.0000 mg | ORAL_TABLET | Freq: Every morning | ORAL | Status: DC
  Administered 2013-07-19 – 2013-07-23 (×5): 80 mg via ORAL
  Filled 2013-07-18 (×6): qty 1

## 2013-07-18 NOTE — Progress Notes (Signed)
[facility] PROGRESS NOTE  [name]. ZOX:[id number] DOB: [birth year] DOA: 07/16/2013 PCP: Romero Belling, MD  Assessment/Plan:  Sepsis  -Secondary to pneumonia  -Given the patient's presentation and recent treatment with Augmentin, he was started on Broad spectrum antibiotics -Blood cultures with no growth to date.  -Will narrow antibiotic coverage from Vanc, zosyn, and levaquin. To just levaquin today 07/18/13 -Urine Legionella antigen, urine Streptococcus pneumoniae antigen both negative  Pneumonia  -Antibiotics as discussed above  -Narrowing antibiotics given improvement in respiratory condition.  Elevated proBNP/chronic diastolic CHF  - 05/27/2007 echocardiogram shows EF 55-60% to be repeated today 7/26 -Does not appear clinically fluid overloaded  -Lasix started back at home dose.  COPD  -Continue Spiriva  -No wheezing on exam  -Albuterol nebulizer every 6 hours  -Continue Symbicort   Diabetes mellitus type 2  -Hemoglobin A1c 7.0 on 05/26/2013  -NovoLog sliding scale  -Continue Precose   Hyperlipidemia  -Continue statin   Lower extremity edema/pain  -Venous Dopplers with no evidence of deep vein or superficial thrombosis Constipation  -Continue Colace and Senokot   Chest pain/NSTEMI - Cardiology on board - NSTEMI, type 2 vs true ACS: Cardiology considering Cath on Monday. - Echocardiogram ordered.  Code Status: full Family Communication: no family at bedside.  Disposition Plan: Pending improvement in condition.  Consultants:  Cardiology: Excell Seltzer  Procedures:  Lower extremity venous duplex  Antibiotics:  Levaquin  Zosyn d/c 7/26  Vancomycin d/c 7/26  HPI/Subjective: Patient has no new complaints today.  Reports feeling less SOB  Objective: Filed Vitals:   07/18/13 0544 07/18/13 0817 07/18/13 0943 07/18/13 1436  BP: 148/57  157/58 140/53  Pulse: 73 81 85 70  Temp: 98 F (36.7 C) 98.5 F (36.9 C) 98.8 F (37.1 C) 98.5 F (36.9  C)  TempSrc: Oral Oral Oral Oral  Resp: 18 25 18 20   Height:      Weight: 95.89 kg (211 lb 6.4 oz)     SpO2: 99% 99% 98% 97%    Intake/Output Summary (Last 24 hours) at 07/18/13 1519 Last data filed at 07/18/13 1504  Gross per 24 hour  Intake   1490 ml  Output   2755 ml  Net  -1265 ml   Filed Weights   07/16/13 1229 07/17/13 0539 07/18/13 0544  Weight: 94.7 kg (208 lb 12.4 oz) 95.437 kg (210 lb 6.4 oz) 95.89 kg (211 lb 6.4 oz)    Exam:   General:  Pt in NAd, Alert and awake  Cardiovascular: RRR, no rubs or gallops  Respiratory: prolonged expiratory phase, rhales, no wheezes  Abdomen: soft, NT, ND  Musculoskeletal: no cyanosis or clubbing   Data Reviewed: Basic Metabolic Panel:  Recent Labs Lab 07/16/13 0900 07/17/13 0505 07/18/13 0410  NA 136 135 134*  K 4.1 4.3 3.9  CL 97 98 97  CO2 26 27 28   GLUCOSE 294* 322* 346*  BUN 40* 39* 38*  CREATININE 1.50* 1.45* 1.52*  CALCIUM 9.2 8.9 8.7   Liver Function Tests: No results found for this basename: AST, ALT, ALKPHOS, BILITOT, PROT, ALBUMIN,  in the last 168 hours No results found for this basename: LIPASE, AMYLASE,  in the last 168 hours No results found for this basename: AMMONIA,  in the last 168 hours CBC:  Recent Labs Lab 07/16/13 0851 07/17/13 0505 07/18/13 0410  WBC 13.5* 15.6* 13.7*  NEUTROABS 12.2*  --   --   HGB 13.5 11.8* 11.8*  HCT 39.8 35.6* 34.1*  MCV 88.2 88.1 87.7  PLT 183 184 184   Cardiac Enzymes:  Recent Labs Lab 07/16/13 1457 07/16/13 2009 07/17/13 1457  TROPONINI 0.76* 2.91* 1.77*   BNP (last 3 results)  Recent Labs  07/16/13 0900  PROBNP 985.5*   CBG:  Recent Labs Lab 07/17/13 1103 07/17/13 1621 07/17/13 2105 07/18/13 0702 07/18/13 1103  GLUCAP 379* 316* 350* 305* 186*    Recent Results (from the past 240 hour(s))  CULTURE, BLOOD (ROUTINE X 2)     Status: None   Collection Time    07/16/13 10:20 AM      Result Value Range Status   Specimen Description  BLOOD HAND RIGHT   Final   Special Requests BOTTLES DRAWN AEROBIC AND ANAEROBIC 10CC   Final   Culture  Setup Time 07/16/2013 16:52   Final   Culture     Final   Value:        BLOOD CULTURE RECEIVED NO GROWTH TO DATE CULTURE WILL BE HELD FOR 5 DAYS BEFORE ISSUING A FINAL NEGATIVE REPORT   Report Status PENDING   Incomplete  CULTURE, BLOOD (ROUTINE X 2)     Status: None   Collection Time    07/16/13 10:30 AM      Result Value Range Status   Specimen Description BLOOD ARM RIGHT   Final   Special Requests BOTTLES DRAWN AEROBIC ONLY 5.0CC   Final   Culture  Setup Time 07/16/2013 16:53   Final   Culture     Final   Value:        BLOOD CULTURE RECEIVED NO GROWTH TO DATE CULTURE WILL BE HELD FOR 5 DAYS BEFORE ISSUING A FINAL NEGATIVE REPORT   Report Status PENDING   Incomplete     Studies: Dg Chest 2 View  07/18/2013   *RADIOLOGY REPORT*  Clinical Data: Shortness of breath  CHEST - 2 VIEW  Comparison: July 16, 2013  Findings: There is underlying emphysema.  There is extensive although patchy infiltrate throughout much of the right lung. There has been some mild partial clearing on the right compared to 2 days prior.  There is mild atelectatic change in the left base.  Heart is mildly enlarged with normal pulmonary vascularity.  No adenopathy.  There is degenerative type change in the thoracic spine.  IMPRESSION: There has been slight partial clearing of infiltrate on the right, although there is fairly extensive patchy infiltrate on the right. There is underlying emphysema.  There is mild atelectatic change in the left base.   Original Report Authenticated By: Bretta Bang, M.D.    Scheduled Meds: . sodium chloride   Intravenous Once  . acarbose  25 mg Oral TID WC  . albuterol  2.5 mg Nebulization Q6H  . allopurinol  150 mg Oral Daily  . aspirin  81 mg Oral Daily  . atorvastatin  20 mg Oral q1800  . bisoprolol  5 mg Oral Daily  . budesonide-formoterol  2 puff Inhalation BID  . calcitRIOL   0.25 mcg Oral Daily  . citalopram  20 mg Oral Daily  . clopidogrel  75 mg Oral Q breakfast  . diltiazem  180 mg Oral Daily  . docusate sodium  100 mg Oral BID  . enoxaparin (LOVENOX) injection  95 mg Subcutaneous Q12H  . ferrous sulfate  325 mg Oral Q breakfast  . finasteride  5 mg Oral Daily  . folic acid-pyridoxine-cyancobalamin  1 tablet Oral Daily  . furosemide  120 mg Oral QPM  . furosemide  80 mg  Oral q morning - 10a  . insulin aspart  0-15 Units Subcutaneous TID WC  . insulin aspart  0-5 Units Subcutaneous QHS  . insulin detemir  5 Units Subcutaneous QHS  . isosorbide mononitrate  60 mg Oral Daily  . levofloxacin  750 mg Oral Q48H  . pantoprazole  40 mg Oral Daily  . piperacillin-tazobactam  3.375 g Intravenous Q8H  . senna  1 tablet Oral Daily  . sodium chloride  3 mL Intravenous Q12H  . tiotropium  18 mcg Inhalation Daily  . traZODone  100 mg Oral QHS  . vancomycin  750 mg Intravenous Q12H   Continuous Infusions:   Active Problems:   ANEMIA, IRON DEFICIENCY   DIASTOLIC HEART FAILURE, CHRONIC   COPD   DM (diabetes mellitus)   Sepsis   Elevated troponin   NSTEMI (non-ST elevated myocardial infarction)    Time spent: > 35 minutes    Penny Pia  [facility] Pager [phone]. If 7PM-7AM, please contact night-coverage at [email or web address], password [facility] 07/18/2013, 3:19 PM  LOS: 2 days

## 2013-07-18 NOTE — Progress Notes (Signed)
Since AM lasix IV more complains of SOB. Will continue to moniotor

## 2013-07-18 NOTE — Progress Notes (Signed)
*  PRELIMINARY RESULTS* Echocardiogram 2D Echocardiogram has been performed.  [name] 07/18/2013, 3:55 PM

## 2013-07-18 NOTE — Progress Notes (Signed)
Primary cardiologist: Dr. Earney Hamburg  Subjective:   Short of breath this morning with some fullness in his neck. Still coughing.   Objective:   Temp:  [97.9 F (36.6 C)-98.8 F (37.1 C)] 98.8 F (37.1 C) (07/26 0943) Pulse Rate:  [71-85] 85 (07/26 0943) Resp:  [18-25] 18 (07/26 0943) BP: (133-157)/(37-67) 157/58 mmHg (07/26 0943) SpO2:  [98 %-100 %] 98 % (07/26 0943) Weight:  [211 lb 6.4 oz (95.89 kg)] 211 lb 6.4 oz (95.89 kg) (07/26 0544) Last BM Date: 07/16/13  Filed Weights   07/16/13 1229 07/17/13 0539 07/18/13 0544  Weight: 208 lb 12.4 oz (94.7 kg) 210 lb 6.4 oz (95.437 kg) 211 lb 6.4 oz (95.89 kg)    Intake/Output Summary (Last 24 hours) at 07/18/13 0951 Last data filed at 07/18/13 0900  Gross per 24 hour  Intake   1530 ml  Output   2140 ml  Net   -610 ml   Telemetry: Sinus rhythm.  Exam:  General: Up in chair, appears comfortable at this time.  Lungs: Diminished, coarse with rhonchi at the bases.  Cardiac: RRR, no gallop.  Abdomen: NABS.  Extremities: No pitting.  Lab Results:  Basic Metabolic Panel:  Recent Labs Lab 07/16/13 0900 07/17/13 0505 07/18/13 0410  NA 136 135 134*  K 4.1 4.3 3.9  CL 97 98 97  CO2 26 27 28   GLUCOSE 294* 322* 346*  BUN 40* 39* 38*  CREATININE 1.50* 1.45* 1.52*  CALCIUM 9.2 8.9 8.7    CBC:  Recent Labs Lab 07/16/13 0851 07/17/13 0505 07/18/13 0410  WBC 13.5* 15.6* 13.7*  HGB 13.5 11.8* 11.8*  HCT 39.8 35.6* 34.1*  MCV 88.2 88.1 87.7  PLT 183 184 184    Cardiac Enzymes:  Recent Labs Lab 07/16/13 1457 07/16/13 2009 07/17/13 1457  TROPONINI 0.76* 2.91* 1.77*    BNP:  Recent Labs  07/16/13 0900  PROBNP 985.5*     Medications:   Scheduled Medications: . sodium chloride   Intravenous Once  . acarbose  25 mg Oral TID WC  . albuterol  2.5 mg Nebulization Q6H  . allopurinol  150 mg Oral Daily  . aspirin  81 mg Oral Daily  . atorvastatin  20 mg Oral q1800  . bisoprolol  5 mg Oral  Daily  . budesonide-formoterol  2 puff Inhalation BID  . calcitRIOL  0.25 mcg Oral Daily  . citalopram  20 mg Oral Daily  . clopidogrel  75 mg Oral Q breakfast  . diltiazem  180 mg Oral Daily  . docusate sodium  100 mg Oral BID  . enoxaparin (LOVENOX) injection  95 mg Subcutaneous Q12H  . ferrous sulfate  325 mg Oral Q breakfast  . finasteride  5 mg Oral Daily  . folic acid-pyridoxine-cyancobalamin  1 tablet Oral Daily  . furosemide  40 mg Oral BID  . insulin aspart  0-15 Units Subcutaneous TID WC  . insulin aspart  0-5 Units Subcutaneous QHS  . insulin detemir  5 Units Subcutaneous QHS  . isosorbide mononitrate  60 mg Oral Daily  . levofloxacin  750 mg Oral Q48H  . pantoprazole  40 mg Oral Daily  . piperacillin-tazobactam  3.375 g Intravenous Q8H  . senna  1 tablet Oral Daily  . sodium chloride  3 mL Intravenous Q12H  . tiotropium  18 mcg Inhalation Daily  . traZODone  100 mg Oral QHS  . vancomycin  750 mg Intravenous Q12H     PRN Medications:  acetaminophen,  acetaminophen, azelastine, HYDROcodone-acetaminophen, ondansetron (ZOFRAN) IV, ondansetron   Assessment:   1. NSTEMI, possibly type II event in the setting of pneumonia. Peak troponin I 2.9.  2. CAD status post multiple previous percutaneous interventions including overlapping DES to the RCA, nonobstructive disease in 2007.  3. Chronic diastolic heart failure, no recent echocardiogram results found. LVEF 55-60% as of 2008.  4. Community-acquired pneumonia, on broad-spectrum antibiotics. Currently afebrile.  5. COPD.   Plan/Discussion:    Reviewed presentation and history. Patient has actually been on Lasix 80 mg in the morning 120 mg in the evening at home, reduced dose now may also be exacerbating some diastolic heart failure. We will continue to follow as he improves from pneumonia, anticipate cardiac catheterization early next week for reassessment of coronary anatomy. Would also repeat an echocardiogram to  reassess systolic and diastolic function.   Jonelle Sidle, M.D., F.A.C.C.

## 2013-07-18 NOTE — Progress Notes (Addendum)
Pt states SOB, breathing labor , Pt having some nausea d/t sob. Weight is up. Albut and Po lasix giving, mild improve. Page MD.   Lasix 40IV and CXR ordered. Will continue to monitor

## 2013-07-19 DIAGNOSIS — A419 Sepsis, unspecified organism: Secondary | ICD-10-CM | POA: Diagnosis not present

## 2013-07-19 DIAGNOSIS — I5043 Acute on chronic combined systolic (congestive) and diastolic (congestive) heart failure: Secondary | ICD-10-CM | POA: Diagnosis not present

## 2013-07-19 DIAGNOSIS — J019 Acute sinusitis, unspecified: Secondary | ICD-10-CM | POA: Diagnosis not present

## 2013-07-19 DIAGNOSIS — I2510 Atherosclerotic heart disease of native coronary artery without angina pectoris: Secondary | ICD-10-CM | POA: Diagnosis not present

## 2013-07-19 DIAGNOSIS — R109 Unspecified abdominal pain: Secondary | ICD-10-CM | POA: Diagnosis not present

## 2013-07-19 DIAGNOSIS — J13 Pneumonia due to Streptococcus pneumoniae: Secondary | ICD-10-CM | POA: Diagnosis not present

## 2013-07-19 DIAGNOSIS — E119 Type 2 diabetes mellitus without complications: Secondary | ICD-10-CM | POA: Diagnosis not present

## 2013-07-19 DIAGNOSIS — J189 Pneumonia, unspecified organism: Secondary | ICD-10-CM | POA: Diagnosis not present

## 2013-07-19 DIAGNOSIS — I214 Non-ST elevation (NSTEMI) myocardial infarction: Secondary | ICD-10-CM | POA: Diagnosis not present

## 2013-07-19 DIAGNOSIS — I509 Heart failure, unspecified: Secondary | ICD-10-CM | POA: Diagnosis not present

## 2013-07-19 DIAGNOSIS — I10 Essential (primary) hypertension: Secondary | ICD-10-CM

## 2013-07-19 LAB — GLUCOSE, CAPILLARY
Glucose-Capillary: 224 mg/dL — ABNORMAL HIGH (ref 70–99)
Glucose-Capillary: 218 mg/dL — ABNORMAL HIGH (ref 70–99)

## 2013-07-19 LAB — BASIC METABOLIC PANEL
BUN: 29 mg/dL — ABNORMAL HIGH (ref 6–23)
CO2: 32 mEq/L (ref 19–32)
Chloride: 98 mEq/L (ref 96–112)
Creatinine, Ser: 1.55 mg/dL — ABNORMAL HIGH (ref 0.50–1.35)
GFR calc Af Amer: 50 mL/min — ABNORMAL LOW (ref >90)
Potassium: 3.9 mEq/L (ref 3.5–5.1)

## 2013-07-19 LAB — PROTIME-INR
INR: 1.08 (ref 0.00–1.49)
Prothrombin Time: 13.8 seconds (ref 11.6–15.2)

## 2013-07-19 MED ORDER — ACARBOSE 25 MG PO TABS
25.0000 mg | ORAL_TABLET | Freq: Three times a day (TID) | ORAL | Status: DC
  Administered 2013-07-19 – 2013-07-21 (×5): 25 mg via ORAL
  Filled 2013-07-19 (×8): qty 1

## 2013-07-19 MED ORDER — SODIUM CHLORIDE 0.9 % IV SOLN
INTRAVENOUS | Status: DC
  Administered 2013-07-20: 06:00:00 via INTRAVENOUS

## 2013-07-19 MED ORDER — ASPIRIN 81 MG PO CHEW
324.0000 mg | CHEWABLE_TABLET | ORAL | Status: AC
  Administered 2013-07-20: 324 mg via ORAL
  Filled 2013-07-19: qty 4

## 2013-07-19 NOTE — Progress Notes (Signed)
Pt up at sink. No complaints of SOB. O4x. Per night RN pt states when has developed problems swallowing for the past 3 months. May need to have [facility] look while in [place].

## 2013-07-19 NOTE — Progress Notes (Signed)
 Primary cardiologist: Dr. Chris McAlhany  Subjective:   Less short of breath today. No active coughing. No chest pain. States that he has been having trouble with dysphagia over the last few months.   Objective:   Temp:  [98 F (36.7 C)-99 F (37.2 C)] 98 F (36.7 C) (07/27 0718) Pulse Rate:  [70-85] 71 (07/27 0718) Resp:  [18-25] 18 (07/27 0718) BP: (140-157)/(53-58) 151/54 mmHg (07/27 0718) SpO2:  [93 %-99 %] 93 % (07/27 0718) Weight:  [206 lb 9.1 oz (93.7 kg)] 206 lb 9.1 oz (93.7 kg) (07/27 0718) Last BM Date: 07/19/13  Filed Weights   07/17/13 0539 07/18/13 0544 07/19/13 0718  Weight: 210 lb 6.4 oz (95.437 kg) 211 lb 6.4 oz (95.89 kg) 206 lb 9.1 oz (93.7 kg)    Intake/Output Summary (Last 24 hours) at 07/19/13 0805 Last data filed at 07/19/13 0722  Gross per 24 hour  Intake   1420 ml  Output   3140 ml  Net  -1720 ml   Telemetry: Sinus rhythm.  Exam:  General: Up in chair, appears comfortable at this time.  Lungs: Diminished, coarse with rhonchi at the bases.  Cardiac: RRR, no gallop.  Abdomen: NABS.  Extremities: No pitting.  Lab Results:  Basic Metabolic Panel:  Recent Labs Lab 07/17/13 0505 07/18/13 0410 07/19/13 0450  NA 135 134* 138  K 4.3 3.9 3.9  CL 98 97 98  CO2 27 28 32  GLUCOSE 322* 346* 231*  BUN 39* 38* 29*  CREATININE 1.45* 1.52* 1.55*  CALCIUM 8.9 8.7 9.0    CBC:  Recent Labs Lab 07/16/13 0851 07/17/13 0505 07/18/13 0410  WBC 13.5* 15.6* 13.7*  HGB 13.5 11.8* 11.8*  HCT 39.8 35.6* 34.1*  MCV 88.2 88.1 87.7  PLT 183 184 184    Cardiac Enzymes:  Recent Labs Lab 07/16/13 1457 07/16/13 2009 07/17/13 1457  TROPONINI 0.76* 2.91* 1.77*    Echocardiogram 7/26: Study Conclusions  - Left ventricle: The cavity size was normal. Wall thickness was increased in a pattern of mild LVH. Systolic function was moderately reduced. The estimated ejection fraction was in the range of 35% to 40%. Diffuse hypokinesis. Features  are consistent with a pseudonormal left ventricular filling pattern, with concomitant abnormal relaxation and increased filling pressure (grade 2 diastolic dysfunction). - Aortic valve: Mild regurgitation. - Mitral valve: Mild regurgitation. - Left atrium: The atrium was moderately dilated. - Right atrium: The atrium was mildly dilated.    Medications:   Scheduled Medications: . sodium chloride   Intravenous Once  . acarbose  25 mg Oral TID WC  . albuterol  2.5 mg Nebulization Q6H  . allopurinol  150 mg Oral Daily  . aspirin  81 mg Oral Daily  . atorvastatin  20 mg Oral q1800  . bisoprolol  5 mg Oral Daily  . budesonide-formoterol  2 puff Inhalation BID  . calcitRIOL  0.25 mcg Oral Daily  . citalopram  20 mg Oral Daily  . clopidogrel  75 mg Oral Q breakfast  . diltiazem  180 mg Oral Daily  . docusate sodium  100 mg Oral BID  . enoxaparin (LOVENOX) injection  95 mg Subcutaneous Q12H  . ferrous sulfate  325 mg Oral Q breakfast  . finasteride  5 mg Oral Daily  . folic acid-pyridoxine-cyancobalamin  1 tablet Oral Daily  . furosemide  120 mg Oral QPM  . furosemide  80 mg Oral q morning - 10a  . insulin aspart  0-15 Units Subcutaneous TID   WC  . insulin aspart  0-5 Units Subcutaneous QHS  . insulin detemir  5 Units Subcutaneous QHS  . isosorbide mononitrate  60 mg Oral Daily  . levofloxacin  750 mg Oral Q48H  . pantoprazole  40 mg Oral Daily  . senna  1 tablet Oral Daily  . sodium chloride  3 mL Intravenous Q12H  . tiotropium  18 mcg Inhalation Daily  . traZODone  100 mg Oral QHS    PRN Medications: acetaminophen, acetaminophen, azelastine, HYDROcodone-acetaminophen, ondansetron (ZOFRAN) IV, ondansetron   Assessment:   1. NSTEMI, possibly type II event in the setting of pneumonia. Peak troponin I 2.9.  2. CAD status post multiple previous percutaneous interventions including overlapping DES to the RCA, nonobstructive disease in 2007.  3. Acute on chronic combined heart  failure. Followup echocardiogram from 7/26 shows reduction in LVEF the range of 35-40% with diffuse hypokinesis and grade 2 diastolic dysfunction - new compared to 2008.  4. Community-acquired pneumonia, on broad-spectrum antibiotics. Currently afebrile.  5. COPD.  6. CKD, stage 2-3. Creatinine stable at 1.5.  7. Dysphagia. Has history of lap band procedure. Will eventually need GI evaluation once cardiac status is better clarified.   Plan/Discussion:    Reviewed echocardiogram results with the patient. Plan will be to pursue heart catheterization, left and right heart likely tomorrow, as long as creatinine remains stable. Can then adjust medical regimen from there - will likely need to come off cardizem and perhaps start ACE-I or ARB if creatinine remains stable. Followup labs AM.  Staphanie Harbison G. Cohen Boettner, M.D., F.A.C.C.  

## 2013-07-19 NOTE — Progress Notes (Signed)
[facility] PROGRESS NOTE  [name]. ZOX:[id number] DOB: 12-14-1941 DOA: 07/16/2013 PCP: Romero Belling, MD  Assessment/Plan:  Sepsis  -Secondary to pneumonia  -Given the patient's presentation and recent treatment with Augmentin, he was started on Broad spectrum antibiotics -Blood cultures with no growth to date.  -Will narrow antibiotic coverage from Vanc, zosyn, and levaquin. To just levaquin today 07/18/13 -Urine Legionella antigen, urine Streptococcus pneumoniae antigen both negative  Pneumonia  -Antibiotics narrowed as discussed above   Elevated proBNP/chronic diastolic CHF  - 05/27/2007 echocardiogram shows EF 55-60 percent, repeat Echocardiogram shows an EF of 35-40 percent.  As such patient will likely go to cath for further evaluation and recommendations. -Does not appear clinically fluid overloaded  -Lasix started back at home dose.  COPD  -Continue Spiriva  -No wheezing on exam  -Albuterol nebulizer every 6 hours  -Continue Symbicort   Diabetes mellitus type 2  -Hemoglobin A1c 7.0 on 05/26/2013  -NovoLog sliding scale  -Continue Precose   Hyperlipidemia  -Continue statin   Lower extremity edema/pain  -Venous Dopplers with no evidence of deep vein or superficial thrombosis Constipation  -Continue Colace and Senokot   Chest pain/NSTEMI - Cardiology on board - NSTEMI, type 2 vs true ACS: Cardiology considering Cath on Monday. - Echocardiogram ordered.  Code Status: full Family Communication: no family at bedside.  Disposition Plan: Pending improvement in condition.  Consultants:  Cardiology: Excell Seltzer  Procedures:  Lower extremity venous duplex  Antibiotics:  Levaquin  Zosyn d/c 7/26  Vancomycin d/c 7/26  HPI/Subjective: Patient has no new complaints today.  Reports feeling less SOB  Objective: Filed Vitals:   07/19/13 0718 07/19/13 0958 07/19/13 1038 07/19/13 1300  BP: 151/54  161/55 137/52  Pulse: 71  71 71  Temp: 98 F  (36.7 C)   98.5 F (36.9 C)  TempSrc: Oral   Oral  Resp: 18  18 18   Height:      Weight: 93.7 kg (206 lb 9.1 oz)     SpO2: 93% 96% 96% 95%    Intake/Output Summary (Last 24 hours) at 07/19/13 1532 Last data filed at 07/19/13 1300  Gross per 24 hour  Intake    900 ml  Output   2725 ml  Net  -1825 ml   Filed Weights   07/17/13 0539 07/18/13 0544 07/19/13 0718  Weight: 95.437 kg (210 lb 6.4 oz) 95.89 kg (211 lb 6.4 oz) 93.7 kg (206 lb 9.1 oz)    Exam:   General:  Pt in NAd, Alert and awake  Cardiovascular: RRR, no rubs or gallops  Respiratory: prolonged expiratory phase, rhales, no wheezes  Abdomen: soft, NT, ND  Musculoskeletal: no cyanosis or clubbing   Data Reviewed: Basic Metabolic Panel:  Recent Labs Lab 07/16/13 0900 07/17/13 0505 07/18/13 0410 07/19/13 0450  NA 136 135 134* 138  K 4.1 4.3 3.9 3.9  CL 97 98 97 98  CO2 26 27 28  32  GLUCOSE 294* 322* 346* 231*  BUN 40* 39* 38* 29*  CREATININE 1.50* 1.45* 1.52* 1.55*  CALCIUM 9.2 8.9 8.7 9.0   Liver Function Tests: No results found for this basename: AST, ALT, ALKPHOS, BILITOT, PROT, ALBUMIN,  in the last 168 hours No results found for this basename: LIPASE, AMYLASE,  in the last 168 hours No results found for this basename: AMMONIA,  in the last 168 hours CBC:  Recent Labs Lab 07/16/13 0851 07/17/13 0505 07/18/13 0410  WBC 13.5* 15.6* 13.7*  NEUTROABS 12.2*  --   --  HGB 13.5 11.8* 11.8*  HCT 39.8 35.6* 34.1*  MCV 88.2 88.1 87.7  PLT 183 184 184   Cardiac Enzymes:  Recent Labs Lab 07/16/13 1457 07/16/13 2009 07/17/13 1457  TROPONINI 0.76* 2.91* 1.77*   BNP (last 3 results)  Recent Labs  07/16/13 0900  PROBNP 985.5*   CBG:  Recent Labs Lab 07/18/13 1103 07/18/13 1605 07/18/13 2109 07/19/13 0517 07/19/13 1144  GLUCAP 186* 250* 251* 224* 218*    Recent Results (from the past 240 hour(s))  CULTURE, BLOOD (ROUTINE X 2)     Status: None   Collection Time    07/16/13  10:20 AM      Result Value Range Status   Specimen Description BLOOD HAND RIGHT   Final   Special Requests BOTTLES DRAWN AEROBIC AND ANAEROBIC 10CC   Final   Culture  Setup Time 07/16/2013 16:52   Final   Culture     Final   Value:        BLOOD CULTURE RECEIVED NO GROWTH TO DATE CULTURE WILL BE HELD FOR 5 DAYS BEFORE ISSUING A FINAL NEGATIVE REPORT   Report Status PENDING   Incomplete  CULTURE, BLOOD (ROUTINE X 2)     Status: None   Collection Time    07/16/13 10:30 AM      Result Value Range Status   Specimen Description BLOOD ARM RIGHT   Final   Special Requests BOTTLES DRAWN AEROBIC ONLY 5.0CC   Final   Culture  Setup Time 07/16/2013 16:53   Final   Culture     Final   Value:        BLOOD CULTURE RECEIVED NO GROWTH TO DATE CULTURE WILL BE HELD FOR 5 DAYS BEFORE ISSUING A FINAL NEGATIVE REPORT   Report Status PENDING   Incomplete     Studies: Dg Chest 2 View  07/18/2013   *RADIOLOGY REPORT*  Clinical Data: Shortness of breath  CHEST - 2 VIEW  Comparison: July 16, 2013  Findings: There is underlying emphysema.  There is extensive although patchy infiltrate throughout much of the right lung. There has been some mild partial clearing on the right compared to 2 days prior.  There is mild atelectatic change in the left base.  Heart is mildly enlarged with normal pulmonary vascularity.  No adenopathy.  There is degenerative type change in the thoracic spine.  IMPRESSION: There has been slight partial clearing of infiltrate on the right, although there is fairly extensive patchy infiltrate on the right. There is underlying emphysema.  There is mild atelectatic change in the left base.   Original Report Authenticated By: Bretta Bang, M.D.    Scheduled Meds: . sodium chloride   Intravenous Once  . acarbose  25 mg Oral TID WC  . albuterol  2.5 mg Nebulization Q6H  . allopurinol  150 mg Oral Daily  . aspirin  81 mg Oral Daily  . atorvastatin  20 mg Oral q1800  . bisoprolol  5 mg Oral  Daily  . budesonide-formoterol  2 puff Inhalation BID  . calcitRIOL  0.25 mcg Oral Daily  . citalopram  20 mg Oral Daily  . clopidogrel  75 mg Oral Q breakfast  . diltiazem  180 mg Oral Daily  . docusate sodium  100 mg Oral BID  . enoxaparin (LOVENOX) injection  95 mg Subcutaneous Q12H  . ferrous sulfate  325 mg Oral Q breakfast  . finasteride  5 mg Oral Daily  . folic acid-pyridoxine-cyancobalamin  1 tablet Oral  Daily  . furosemide  120 mg Oral QPM  . furosemide  80 mg Oral q morning - 10a  . insulin aspart  0-15 Units Subcutaneous TID WC  . insulin aspart  0-5 Units Subcutaneous QHS  . insulin detemir  5 Units Subcutaneous QHS  . isosorbide mononitrate  60 mg Oral Daily  . levofloxacin  750 mg Oral Q48H  . pantoprazole  40 mg Oral Daily  . senna  1 tablet Oral Daily  . sodium chloride  3 mL Intravenous Q12H  . tiotropium  18 mcg Inhalation Daily  . traZODone  100 mg Oral QHS   Continuous Infusions:   Active Problems:   ANEMIA, IRON DEFICIENCY   DIASTOLIC HEART FAILURE, CHRONIC   COPD   DM (diabetes mellitus)   Sepsis   Elevated troponin   NSTEMI (non-ST elevated myocardial infarction)    Time spent: > 35 minutes    Penny Pia  [facility] Pager [phone]. If 7PM-7AM, please contact night-coverage at [email or web address], password [facility] 07/19/2013, 3:32 PM  LOS: 3 days

## 2013-07-20 ENCOUNTER — Encounter (HOSPITAL_COMMUNITY)
Admission: EM | Disposition: A | Discharge status: Home / Self Care | Payer: Self-pay | Source: Home / Self Care | Attending: Family Medicine

## 2013-07-20 DIAGNOSIS — J449 Chronic obstructive pulmonary disease, unspecified: Secondary | ICD-10-CM | POA: Diagnosis not present

## 2013-07-20 DIAGNOSIS — I5032 Chronic diastolic (congestive) heart failure: Secondary | ICD-10-CM | POA: Diagnosis not present

## 2013-07-20 DIAGNOSIS — I509 Heart failure, unspecified: Secondary | ICD-10-CM | POA: Diagnosis not present

## 2013-07-20 DIAGNOSIS — I2510 Atherosclerotic heart disease of native coronary artery without angina pectoris: Secondary | ICD-10-CM

## 2013-07-20 DIAGNOSIS — J189 Pneumonia, unspecified organism: Secondary | ICD-10-CM | POA: Diagnosis not present

## 2013-07-20 HISTORY — PX: LEFT AND RIGHT HEART CATHETERIZATION WITH CORONARY ANGIOGRAM: SHX5449

## 2013-07-20 LAB — BASIC METABOLIC PANEL
CO2: 26 mEq/L (ref 19–32)
Chloride: 98 mEq/L (ref 96–112)
Creatinine, Ser: 1.41 mg/dL — ABNORMAL HIGH (ref 0.50–1.35)
Glucose, Bld: 221 mg/dL — ABNORMAL HIGH (ref 70–99)
Sodium: 137 mEq/L (ref 135–145)

## 2013-07-20 LAB — POCT I-STAT 3, ART BLOOD GAS (G3+)
Acid-Base Excess: 3 mmol/L — ABNORMAL HIGH (ref 0.0–2.0)
Bicarbonate: 28.5 mEq/L — ABNORMAL HIGH (ref 20.0–24.0)
O2 Saturation: 98 %
TCO2: 30 mmol/L (ref 0–100)

## 2013-07-20 LAB — POCT I-STAT 3, VENOUS BLOOD GAS (G3P V)
Bicarbonate: 26.4 mEq/L — ABNORMAL HIGH (ref 20.0–24.0)
pCO2, Ven: 50.7 mmHg — ABNORMAL HIGH (ref 45.0–50.0)
pH, Ven: 7.325 — ABNORMAL HIGH (ref 7.250–7.300)
pO2, Ven: 41 mmHg (ref 30.0–45.0)

## 2013-07-20 LAB — CBC
HCT: 34.8 % — ABNORMAL LOW (ref 39.0–52.0)
MCH: 30.3 pg (ref 26.0–34.0)
MCV: 87.2 fL (ref 78.0–100.0)
RBC: 3.99 MIL/uL — ABNORMAL LOW (ref 4.22–5.81)
WBC: 6.8 10*3/uL (ref 4.0–10.5)

## 2013-07-20 LAB — GLUCOSE, CAPILLARY: Glucose-Capillary: 224 mg/dL — ABNORMAL HIGH (ref 70–99)

## 2013-07-20 SURGERY — LEFT AND RIGHT HEART CATHETERIZATION WITH CORONARY ANGIOGRAM
Anesthesia: LOCAL

## 2013-07-20 MED ORDER — LIDOCAINE HCL (PF) 1 % IJ SOLN
INTRAMUSCULAR | Status: AC
  Filled 2013-07-20: qty 30

## 2013-07-20 MED ORDER — MIDAZOLAM HCL 2 MG/2ML IJ SOLN
INTRAMUSCULAR | Status: AC
  Filled 2013-07-20: qty 2

## 2013-07-20 MED ORDER — OXYCODONE-ACETAMINOPHEN 5-325 MG PO TABS
1.0000 | ORAL_TABLET | ORAL | Status: DC | PRN
  Administered 2013-07-20 – 2013-07-21 (×2): 2 via ORAL
  Administered 2013-07-23: 1 via ORAL
  Filled 2013-07-20: qty 2
  Filled 2013-07-20: qty 1
  Filled 2013-07-20: qty 2

## 2013-07-20 MED ORDER — SODIUM CHLORIDE 0.9 % IJ SOLN: 3.0000 mL | INTRAMUSCULAR | Status: DC | PRN

## 2013-07-20 MED ORDER — LABETALOL HCL 5 MG/ML IV SOLN
20.0000 mg | INTRAVENOUS | Status: DC | PRN
  Filled 2013-07-20: qty 4

## 2013-07-20 MED ORDER — POTASSIUM CHLORIDE CRYS ER 20 MEQ PO TBCR
40.0000 meq | EXTENDED_RELEASE_TABLET | Freq: Once | ORAL | Status: AC
  Administered 2013-07-20: 40 meq via ORAL
  Filled 2013-07-20: qty 2

## 2013-07-20 MED ORDER — NITROGLYCERIN 0.2 MG/ML ON CALL CATH LAB
INTRAVENOUS | Status: AC
  Filled 2013-07-20: qty 1

## 2013-07-20 MED ORDER — FENTANYL CITRATE 0.05 MG/ML IJ SOLN
INTRAMUSCULAR | Status: AC
  Filled 2013-07-20: qty 2

## 2013-07-20 MED ORDER — SODIUM CHLORIDE 0.9 % IJ SOLN: 3.0000 mL | Freq: Two times a day (BID) | INTRAMUSCULAR | Status: DC

## 2013-07-20 MED ORDER — LOPERAMIDE HCL 2 MG PO CAPS: 2.0000 mg | ORAL_CAPSULE | ORAL | Status: DC | PRN

## 2013-07-20 MED ORDER — SODIUM CHLORIDE 0.9 % IV SOLN: 250.0000 mL | INTRAVENOUS | Status: DC | PRN

## 2013-07-20 MED ORDER — HYDRALAZINE HCL 20 MG/ML IJ SOLN
10.0000 mg | INTRAMUSCULAR | Status: DC | PRN
  Administered 2013-07-20: 10 mg via INTRAVENOUS
  Filled 2013-07-20: qty 1

## 2013-07-20 MED ORDER — SODIUM CHLORIDE 0.9 % IJ SOLN
3.0000 mL | Freq: Two times a day (BID) | INTRAMUSCULAR | Status: DC
  Administered 2013-07-20 – 2013-07-23 (×6): 3 mL via INTRAVENOUS

## 2013-07-20 MED ORDER — HEPARIN (PORCINE) IN NACL 2-0.9 UNIT/ML-% IJ SOLN
INTRAMUSCULAR | Status: AC
  Filled 2013-07-20: qty 1000

## 2013-07-20 MED ORDER — SODIUM CHLORIDE 0.9 % IV SOLN
1.0000 mL/kg/h | INTRAVENOUS | Status: AC
  Administered 2013-07-20: 1 mL/kg/h via INTRAVENOUS

## 2013-07-20 NOTE — H&P (View-Only) (Signed)
Primary cardiologist: Dr. Earney Hamburg  Subjective:   Less short of breath today. No active coughing. No chest pain. States that he has been having trouble with dysphagia over the last few months.   Objective:   Temp:  [98 F (36.7 C)-99 F (37.2 C)] 98 F (36.7 C) (07/27 0718) Pulse Rate:  [70-85] 71 (07/27 0718) Resp:  [18-25] 18 (07/27 0718) BP: (140-157)/(53-58) 151/54 mmHg (07/27 0718) SpO2:  [93 %-99 %] 93 % (07/27 0718) Weight:  [206 lb 9.1 oz (93.7 kg)] 206 lb 9.1 oz (93.7 kg) (07/27 0718) Last BM Date: 07/19/13  Filed Weights   07/17/13 0539 07/18/13 0544 07/19/13 0718  Weight: 210 lb 6.4 oz (95.437 kg) 211 lb 6.4 oz (95.89 kg) 206 lb 9.1 oz (93.7 kg)    Intake/Output Summary (Last 24 hours) at 07/19/13 0805 Last data filed at 07/19/13 9562  Gross per 24 hour  Intake   1420 ml  Output   3140 ml  Net  -1720 ml   Telemetry: Sinus rhythm.  Exam:  General: Up in chair, appears comfortable at this time.  Lungs: Diminished, coarse with rhonchi at the bases.  Cardiac: RRR, no gallop.  Abdomen: NABS.  Extremities: No pitting.  Lab Results:  Basic Metabolic Panel:  Recent Labs Lab 07/17/13 0505 07/18/13 0410 07/19/13 0450  NA 135 134* 138  K 4.3 3.9 3.9  CL 98 97 98  CO2 27 28 32  GLUCOSE 322* 346* 231*  BUN 39* 38* 29*  CREATININE 1.45* 1.52* 1.55*  CALCIUM 8.9 8.7 9.0    CBC:  Recent Labs Lab 07/16/13 0851 07/17/13 0505 07/18/13 0410  WBC 13.5* 15.6* 13.7*  HGB 13.5 11.8* 11.8*  HCT 39.8 35.6* 34.1*  MCV 88.2 88.1 87.7  PLT 183 184 184    Cardiac Enzymes:  Recent Labs Lab 07/16/13 1457 07/16/13 2009 07/17/13 1457  TROPONINI 0.76* 2.91* 1.77*    Echocardiogram 7/26: Study Conclusions  - Left ventricle: The cavity size was normal. Wall thickness was increased in a pattern of mild LVH. Systolic function was moderately reduced. The estimated ejection fraction was in the range of 35% to 40%. Diffuse hypokinesis. Features  are consistent with a pseudonormal left ventricular filling pattern, with concomitant abnormal relaxation and increased filling pressure (grade 2 diastolic dysfunction). - Aortic valve: Mild regurgitation. - Mitral valve: Mild regurgitation. - Left atrium: The atrium was moderately dilated. - Right atrium: The atrium was mildly dilated.    Medications:   Scheduled Medications: . sodium chloride   Intravenous Once  . acarbose  25 mg Oral TID WC  . albuterol  2.5 mg Nebulization Q6H  . allopurinol  150 mg Oral Daily  . aspirin  81 mg Oral Daily  . atorvastatin  20 mg Oral q1800  . bisoprolol  5 mg Oral Daily  . budesonide-formoterol  2 puff Inhalation BID  . calcitRIOL  0.25 mcg Oral Daily  . citalopram  20 mg Oral Daily  . clopidogrel  75 mg Oral Q breakfast  . diltiazem  180 mg Oral Daily  . docusate sodium  100 mg Oral BID  . enoxaparin (LOVENOX) injection  95 mg Subcutaneous Q12H  . ferrous sulfate  325 mg Oral Q breakfast  . finasteride  5 mg Oral Daily  . folic acid-pyridoxine-cyancobalamin  1 tablet Oral Daily  . furosemide  120 mg Oral QPM  . furosemide  80 mg Oral q morning - 10a  . insulin aspart  0-15 Units Subcutaneous TID  WC  . insulin aspart  0-5 Units Subcutaneous QHS  . insulin detemir  5 Units Subcutaneous QHS  . isosorbide mononitrate  60 mg Oral Daily  . levofloxacin  750 mg Oral Q48H  . pantoprazole  40 mg Oral Daily  . senna  1 tablet Oral Daily  . sodium chloride  3 mL Intravenous Q12H  . tiotropium  18 mcg Inhalation Daily  . traZODone  100 mg Oral QHS    PRN Medications: acetaminophen, acetaminophen, azelastine, HYDROcodone-acetaminophen, ondansetron (ZOFRAN) IV, ondansetron   Assessment:   1. NSTEMI, possibly type II event in the setting of pneumonia. Peak troponin I 2.9.  2. CAD status post multiple previous percutaneous interventions including overlapping DES to the RCA, nonobstructive disease in 2007.  3. Acute on chronic combined heart  failure. Followup echocardiogram from 7/26 shows reduction in LVEF the range of 35-40% with diffuse hypokinesis and grade 2 diastolic dysfunction - new compared to 2008.  4. Community-acquired pneumonia, on broad-spectrum antibiotics. Currently afebrile.  5. COPD.  6. CKD, stage 2-3. Creatinine stable at 1.5.  7. Dysphagia. Has history of lap band procedure. Will eventually need GI evaluation once cardiac status is better clarified.   Plan/Discussion:    Reviewed echocardiogram results with the patient. Plan will be to pursue heart catheterization, left and right heart likely tomorrow, as long as creatinine remains stable. Can then adjust medical regimen from there - will likely need to come off cardizem and perhaps start ACE-I or ARB if creatinine remains stable. Followup labs AM.  Jonelle Sidle, M.D., F.A.C.C.

## 2013-07-20 NOTE — Interval H&P Note (Signed)
History and Physical Interval Note:  07/20/2013 7:59 AM  Rica Koyanagi.  has presented today for surgery, with the diagnosis of cp  The various methods of treatment have been discussed with the patient and family. After consideration of risks, benefits and other options for treatment, the patient has consented to  Procedure(s): LEFT AND RIGHT HEART CATHETERIZATION WITH CORONARY ANGIOGRAM (N/A) as a surgical intervention .  The patient's history has been reviewed, patient examined, no change in status, stable for surgery.  I have reviewed the patient's chart and labs.  Questions were answered to the patient's satisfaction.    Cath Lab Visit (complete for each Cath Lab visit)  Clinical Evaluation Leading to the Procedure:   ACS: yes  Non-ACS:    Anginal Classification: CCS IV  Anti-ischemic medical therapy: Maximal Therapy (2 or more classes of medications)  Non-Invasive Test Results: No non-invasive testing performed  Prior CABG: No previous CABG         Tonny Bollman

## 2013-07-20 NOTE — Progress Notes (Signed)
Patient c/o pain 7/10 to leg.  PRN pain medication given per MAR.  Patient resting comfortably in no acute distress.  RN will continue to monitor. Louretta Parma, RN

## 2013-07-20 NOTE — ED Provider Notes (Signed)
Medical screening examination/treatment/procedure(s) were conducted as a shared visit with non-physician practitioner(s) and myself.  I personally evaluated the patient during the encounter.  Derwood Kaplan, MD 07/20/13 [phone]

## 2013-07-20 NOTE — Progress Notes (Signed)
Pt complaining of hard to swallow (same as pre admitt) . Pt having diarrhea 2 days. Imodium offered

## 2013-07-20 NOTE — Progress Notes (Signed)
[facility] PROGRESS NOTE  [name]. XBJ:[id number] DOB: [birth year] DOA: 07/16/2013 PCP: Romero Belling, MD  Assessment/Plan:  Sepsis  -Secondary to pneumonia  -Given the patient's presentation and recent treatment with Augmentin, he was started on Broad spectrum antibiotics -Blood cultures with no growth to date.  -On levaquin on 07/18/13, tolerating well and continues to show improvement. -Urine Legionella antigen, urine Streptococcus pneumoniae antigen both negative  Pneumonia  -Antibiotics narrowed as discussed above   Elevated proBNP/chronic diastolic CHF  - 05/27/2007 echocardiogram shows EF 55-60 percent, repeat Echocardiogram shows an EF of 35-40 percent.   - Pt is s/p cath. Please see report in chart.  COPD  -Continue Spiriva  -No wheezing on exam  -Albuterol nebulizer every 6 hours  -Continue Symbicort   Diabetes mellitus type 2  -Hemoglobin A1c 7.0 on 05/26/2013  -NovoLog sliding scale  -Continue Precose   Hyperlipidemia  -Continue statin   Lower extremity edema/pain  -Venous Dopplers with no evidence of deep vein or superficial thrombosis Constipation  -Continue Colace and Senokot   Chest pain/NSTEMI - Cardiology on board: currently considering further medical management vs pci - Patient had cath performed today 7/28  Code Status: full Family Communication: no family at bedside.  Disposition Plan: Pending improvement in condition.  Consultants:  Cardiology: Excell Seltzer  Procedures:  Lower extremity venous duplex  Antibiotics:  Levaquin  Zosyn d/c 7/26  Vancomycin d/c 7/26  HPI/Subjective: Patient has no new complaints today.  Reports feeling better as far as his respiratory condition is concerned.  Objective: Filed Vitals:   07/20/13 1235 07/20/13 1300 07/20/13 1417 07/20/13 1824  BP: 169/50 163/62 164/60 128/52  Pulse: 69 72 88 78  Temp:   97.9 F (36.6 C)   TempSrc:   Oral   Resp:   18   Height:      Weight:      SpO2:    97%     Intake/Output Summary (Last 24 hours) at 07/20/13 1836 Last data filed at 07/20/13 1824  Gross per 24 hour  Intake 1300.63 ml  Output   1153 ml  Net 147.63 ml   Filed Weights   07/18/13 0544 07/19/13 0718 07/20/13 0612  Weight: 95.89 kg (211 lb 6.4 oz) 93.7 kg (206 lb 9.1 oz) 92.9 kg (204 lb 12.9 oz)    Exam:   General:  Pt in NAd, Alert and awake  Cardiovascular: RRR, no rubs or gallops  Respiratory: prolonged expiratory phase, rhales, no wheezes  Abdomen: soft, NT, ND  Musculoskeletal: no cyanosis or clubbing   Data Reviewed: Basic Metabolic Panel:  Recent Labs Lab 07/16/13 0900 07/17/13 0505 07/18/13 0410 07/19/13 0450 07/20/13 0515  NA 136 135 134* 138 137  K 4.1 4.3 3.9 3.9 3.4*  CL 97 98 97 98 98  CO2 26 27 28  32 26  GLUCOSE 294* 322* 346* 231* 221*  BUN 40* 39* 38* 29* 25*  CREATININE 1.50* 1.45* 1.52* 1.55* 1.41*  CALCIUM 9.2 8.9 8.7 9.0 9.2   Liver Function Tests: No results found for this basename: AST, ALT, ALKPHOS, BILITOT, PROT, ALBUMIN,  in the last 168 hours No results found for this basename: LIPASE, AMYLASE,  in the last 168 hours No results found for this basename: AMMONIA,  in the last 168 hours CBC:  Recent Labs Lab 07/16/13 0851 07/17/13 0505 07/18/13 0410 07/20/13 0515  WBC 13.5* 15.6* 13.7* 6.8  NEUTROABS 12.2*  --   --   --   HGB 13.5 11.8* 11.8* 12.1*  HCT 39.8 35.6* 34.1* 34.8*  MCV 88.2 88.1 87.7 87.2  PLT 183 184 184 186   Cardiac Enzymes:  Recent Labs Lab 07/16/13 1457 07/16/13 2009 07/17/13 1457  TROPONINI 0.76* 2.91* 1.77*   BNP (last 3 results)  Recent Labs  07/16/13 0900  PROBNP 985.5*   CBG:  Recent Labs Lab 07/19/13 2100 07/20/13 0542 07/20/13 0909 07/20/13 1201 07/20/13 1621  GLUCAP 206* 204* 192* 224* 184*    Recent Results (from the past 240 hour(s))  CULTURE, BLOOD (ROUTINE X 2)     Status: None   Collection Time    07/16/13 10:20 AM      Result Value Range Status    Specimen Description BLOOD HAND RIGHT   Final   Special Requests BOTTLES DRAWN AEROBIC AND ANAEROBIC 10CC   Final   Culture  Setup Time 07/16/2013 16:52   Final   Culture     Final   Value:        BLOOD CULTURE RECEIVED NO GROWTH TO DATE CULTURE WILL BE HELD FOR 5 DAYS BEFORE ISSUING A FINAL NEGATIVE REPORT   Report Status PENDING   Incomplete  CULTURE, BLOOD (ROUTINE X 2)     Status: None   Collection Time    07/16/13 10:30 AM      Result Value Range Status   Specimen Description BLOOD ARM RIGHT   Final   Special Requests BOTTLES DRAWN AEROBIC ONLY 5.0CC   Final   Culture  Setup Time 07/16/2013 16:53   Final   Culture     Final   Value:        BLOOD CULTURE RECEIVED NO GROWTH TO DATE CULTURE WILL BE HELD FOR 5 DAYS BEFORE ISSUING A FINAL NEGATIVE REPORT   Report Status PENDING   Incomplete     Studies: No results found.  Scheduled Meds: . sodium chloride   Intravenous Once  . acarbose  25 mg Oral TID WC  . albuterol  2.5 mg Nebulization Q6H  . allopurinol  150 mg Oral Daily  . aspirin  81 mg Oral Daily  . atorvastatin  20 mg Oral q1800  . bisoprolol  5 mg Oral Daily  . budesonide-formoterol  2 puff Inhalation BID  . calcitRIOL  0.25 mcg Oral Daily  . citalopram  20 mg Oral Daily  . clopidogrel  75 mg Oral Q breakfast  . diltiazem  180 mg Oral Daily  . docusate sodium  100 mg Oral BID  . ferrous sulfate  325 mg Oral Q breakfast  . finasteride  5 mg Oral Daily  . folic acid-pyridoxine-cyancobalamin  1 tablet Oral Daily  . furosemide  120 mg Oral QPM  . furosemide  80 mg Oral q morning - 10a  . insulin aspart  0-15 Units Subcutaneous TID WC  . insulin aspart  0-5 Units Subcutaneous QHS  . insulin detemir  5 Units Subcutaneous QHS  . isosorbide mononitrate  60 mg Oral Daily  . levofloxacin  750 mg Oral Q48H  . pantoprazole  40 mg Oral Daily  . senna  1 tablet Oral Daily  . sodium chloride  3 mL Intravenous Q12H  . sodium chloride  3 mL Intravenous Q12H  . tiotropium  18  mcg Inhalation Daily  . traZODone  100 mg Oral QHS   Continuous Infusions: . sodium chloride Stopped (07/20/13 1825)    Active Problems:   ANEMIA, IRON DEFICIENCY   DIASTOLIC HEART FAILURE, CHRONIC   COPD   DM (diabetes mellitus)  Sepsis   Elevated troponin   NSTEMI (non-ST elevated myocardial infarction)    Time spent: > 35 minutes    Penny Pia  [facility] Pager [phone]. If 7PM-7AM, please contact night-coverage at [email or web address], password [facility] 07/20/2013, 6:36 PM  LOS: 4 days

## 2013-07-20 NOTE — Progress Notes (Signed)
Pt w/o vte post cath. Will inform MD

## 2013-07-20 NOTE — CV Procedure (Signed)
   Cardiac Catheterization Procedure Note  Name: [name]. MRN: [id number] DOB: 1941-04-02  Procedure: Right Heart Cath, Left Heart Cath, Selective Coronary Angiography  Indication: NSTEMI, known CAD s/p remote PCI of the RCA. Enzyme elevation and chest pain in the setting of pneumonia.  Procedural Details: The right groin was prepped, draped, and anesthetized with 1% lidocaine. Using the modified Seldinger technique a 5 French sheath was placed in the right femoral artery and a 7 French sheath was placed in the right femoral vein. A Swan-Ganz catheter was used for the right heart catheterization. Standard protocol was followed for recording of right heart pressures and sampling of oxygen saturations. Fick cardiac output was calculated. Standard Judkins catheters were used for selective coronary angiography and left ventriculography. There were no immediate procedural complications. The patient was transferred to the post catheterization recovery area for further monitoring.  Procedural Findings: Hemodynamics RA 6 RV 48/7 PA 48/20 mean 32 PCWP 22 LV 176/22 AO 176/66 mean 109  Oxygen saturations: PA 71 AO 98  Cardiac Output (Fick) 5.9  Cardiac Index (Fick) 2.99   Coronary angiography: Coronary dominance: right  Left mainstem: Moderate calcification, widely patent.  Left anterior descending (LAD): Patent throughout, 40-50% stenosis in the mid LAD in segmental fashion. Diagonal branches are small without significant stenosis.  Left circumflex (LCx): Ramus Intermedius - Diffuse 40-50% proximal stenosis. Large vessel. AV circumflex small with mild-moderate diffuse disease and small OM 1 branch without significant stenosis  Right coronary artery (RCA): Heavily stented. 100% occlusion of the mid vessel within the previously implanted stent. Extensive left-right collaterals filling the distal branches of the RCA  Left ventriculography: deferred - pt with CKD and LV function  known by echo (LVEF 35-40)  Final Conclusions:   1. Total occlusion of the mid-RCA within the previously implanted stents with left-to-right collaterals 2. Nonobstructive LAD/LCx stenosis 3. Known moderate LV dysfunction 4. Mildly elevated right heart pressures  Recommendations: Films reviewed. Troponin peaked at 2.9, unclear about chronicity of the RCA occlusion, but could be demand ischemia from pneumonia. If acute, would expect higher troponin. Recommend initial trial of medical therapy and consider PCI only if refractory angina once he recovers. Reasonable to treat with Plavix for med Rx of ACS.  Tonny Bollman 07/20/2013, 8:34 AM

## 2013-07-20 NOTE — Progress Notes (Addendum)
The patient's groin and wrists were clipped for the procedure by the nurse tech.  A second IV was started by the IV team.  The patient declined watching the PCI video due to watching it before.  The patient's only complaint overnight was some bilateral lower leg pain, which was relieved with Norco.

## 2013-07-20 NOTE — Progress Notes (Addendum)
Pt rec from cath lab. Bed rest starts at 10 am sbp >180 prn hydrz giving

## 2013-07-21 ENCOUNTER — Encounter (HOSPITAL_COMMUNITY): Admission: RE | Admit: 2013-07-21 | Payer: Self-pay | Source: Ambulatory Visit

## 2013-07-21 DIAGNOSIS — R109 Unspecified abdominal pain: Secondary | ICD-10-CM | POA: Diagnosis not present

## 2013-07-21 DIAGNOSIS — J189 Pneumonia, unspecified organism: Secondary | ICD-10-CM | POA: Diagnosis not present

## 2013-07-21 DIAGNOSIS — R1310 Dysphagia, unspecified: Secondary | ICD-10-CM

## 2013-07-21 DIAGNOSIS — I2510 Atherosclerotic heart disease of native coronary artery without angina pectoris: Secondary | ICD-10-CM | POA: Diagnosis not present

## 2013-07-21 DIAGNOSIS — I5032 Chronic diastolic (congestive) heart failure: Secondary | ICD-10-CM | POA: Diagnosis not present

## 2013-07-21 DIAGNOSIS — I214 Non-ST elevation (NSTEMI) myocardial infarction: Secondary | ICD-10-CM | POA: Diagnosis not present

## 2013-07-21 LAB — GLUCOSE, CAPILLARY
Glucose-Capillary: 339 mg/dL — ABNORMAL HIGH (ref 70–99)
Glucose-Capillary: 305 mg/dL — ABNORMAL HIGH (ref 70–99)

## 2013-07-21 MED ORDER — ALBUTEROL SULFATE (5 MG/ML) 0.5% IN NEBU
2.5000 mg | INHALATION_SOLUTION | Freq: Two times a day (BID) | RESPIRATORY_TRACT | Status: DC
  Administered 2013-07-21 – 2013-07-23 (×4): 2.5 mg via RESPIRATORY_TRACT
  Filled 2013-07-21 (×4): qty 0.5

## 2013-07-21 MED ORDER — ALBUTEROL SULFATE (5 MG/ML) 0.5% IN NEBU: 2.5000 mg | INHALATION_SOLUTION | Freq: Four times a day (QID) | RESPIRATORY_TRACT | Status: DC | PRN

## 2013-07-21 NOTE — Progress Notes (Signed)
    Subjective:  Feels better. No chest pain. No dyspnea this am.  Objective:  Vital Signs in the last 24 hours: Temp:  [97.9 F (36.6 C)-98.6 F (37 C)] 98.6 F (37 C) (07/29 1056) Pulse Rate:  [64-88] 77 (07/29 1056) Resp:  [18-20] 20 (07/29 1056) BP: (128-164)/(48-67) 154/67 mmHg (07/29 1056) SpO2:  [95 %-100 %] 95 % (07/29 1056) Weight:  [92.08 kg (203 lb)] 92.08 kg (203 lb) (07/29 0528)  Intake/Output from previous day: 07/28 0701 - 07/29 0700 In: 1420.6 [P.O.:840; I.V.:580.6] Out: 1153 [Urine:1150; Stool:3]  Physical Exam: Pt is alert and oriented, NAD HEENT: normal Neck: JVP - normal Lungs: CTA bilaterally CV: RRR without murmur or gallop Abd: soft, NT, Positive BS, no hepatomegaly Ext: no C/C/E, distal pulses intact and equal Skin: warm/dry no rash   Lab Results:  Recent Labs  07/20/13 0515  WBC 6.8  HGB 12.1*  PLT 186    Recent Labs  07/19/13 0450 07/20/13 0515  NA 138 137  K 3.9 3.4*  CL 98 98  CO2 32 26  GLUCOSE 231* 221*  BUN 29* 25*  CREATININE 1.55* 1.41*   No results found for this basename: TROPONINI, CK, MB,  in the last 72 hours  Cardiac Studies: Cath as outlined  Tele: Sinus rhythm  Assessment/Plan:  1. NSTEMI - RCA occlusion with collaterals noted at cath, suspect chronic. Med management recommended. This may have been a 'Type 2' event related to demand ischemia.  2. Acute on chronic combined CHF. Hemodynamics reviewed and reasonably well compensated at present. Recommend continue current medical management. Pt on high-dose oral diuretics, beta-blocker.  Dispo: having significant swallowing problems, food impaction. May need GI eval. I'm going to stop plavix in case he needs EGD/dilatation/etc. Continue ASA. Appears stable for discharge from cardiac standpoint if that workup to be done as outpatient.   Tonny Bollman, M.D. 07/21/2013, 1:15 PM

## 2013-07-21 NOTE — Progress Notes (Signed)
7/29 CBGs on 7/28   [phone] mg/dl   4/09   811-914 mg/dl Recommend discontinuing the Precose while in the hospital.  Consider startom Novolog 3 units TID  meal coverage if patient eats greater than 50 % of meal and  postprandial CBGs continue greater than 180 mg/dl.    Will continue to follow in hospital.   Smith Mince RN BSN CDE

## 2013-07-21 NOTE — Progress Notes (Signed)
[facility] PROGRESS NOTE  [name]. UJW:[id number] DOB: 11-Jul-1941 DOA: 07/16/2013 PCP: Romero Belling, MD Brief Narrative: Patient is a 72 y/o CM with h/o DM, COPD on 2 L supplemental O2 at night, h/o chronic diastolic CHF that presented with chest tightness and shortness of breath.  Patient diagnosed with PNA after chest xray in ED.  During course of admission developed chest discomfort and patient had elevated troponins.  Cardiology consulted and patient is s/p cath. Nearing d/c patient started to complain of dysphagia with solids.   Assessment/Plan:  Sepsis  -Secondary to pneumonia  -Given the patient's presentation and recent treatment with Augmentin, he was started on Broad spectrum antibiotics -Blood cultures with no growth to date.  -On levaquin on 07/18/13, tolerating well and continues to show improvement. -Urine Legionella antigen, urine Streptococcus pneumoniae antigen both negative  Pneumonia  -Antibiotics narrowed as discussed above  - Patient initially had cxr on 7/24 and radiologist had recommended radiographic follow up to document clearing and exclude underlying mass lesion.  Elevated proBNP/chronic diastolic CHF  - 05/27/2007 echocardiogram shows EF 55-60 percent, repeat Echocardiogram shows an EF of 35-40 percent.   - Pt is s/p cath. Please see report in chart.  Difficulty swallowing solids - Will consult speech therapy for further evaluation. Patient may require modified barium swallow. - Cardiology holding plavix in case patient may require further testing by GI (like EGD)  COPD  -Continue Spiriva  -No wheezing on exam  -Albuterol nebulizer every 6 hours  -Continue Symbicort   Diabetes mellitus type 2  -Hemoglobin A1c 7.0 on 05/26/2013  -NovoLog sliding scale   Hyperlipidemia  -Continue statin   Lower extremity edema/pain  -Venous Dopplers with no evidence of deep vein or superficial thrombosis Constipation  -Continue Colace and Senokot    Chest pain/NSTEMI - Cardiology on board:and recommended the following: 1. NSTEMI - RCA occlusion with collaterals noted at cath, suspect chronic. Med management recommended. This may have been a 'Type 2' event related to demand ischemia.  2. Acute on chronic combined CHF. Hemodynamics reviewed and reasonably well compensated at present. Recommend continue current medical management. Pt on high-dose oral diuretics, beta-blocker.  Dispo: having significant swallowing problems, food impaction. May need GI eval. I'm going to stop plavix in case he needs EGD/dilatation/etc. Continue ASA. Appears stable for discharge from cardiac standpoint if that workup to be done as outpatient.  Code Status: full Family Communication: no family at bedside.  Disposition Plan: Once dysphagia is further evaluated may consider d/c.  Consultants:  Cardiology: Excell Seltzer  Procedures:  Lower extremity venous duplex  Antibiotics:  Levaquin day 6/7  Zosyn d/c 7/26  Vancomycin d/c 7/26  HPI/Subjective: Reports feeling better as far as his respiratory condition is concerned.  Patient reports difficulty swallowing specifically solids. This problem is new to him and he reports it has got worse since hospitalization.  Objective: Filed Vitals:   07/21/13 0528 07/21/13 0851 07/21/13 1056 07/21/13 1322  BP: 154/53  154/67 141/61  Pulse: 67  77 72  Temp: 97.9 F (36.6 C)  98.6 F (37 C) 98.6 F (37 C)  TempSrc: Oral  Oral Oral  Resp: 18  20 20   Height:      Weight: 92.08 kg (203 lb)     SpO2: 97% 96% 95% 99%    Intake/Output Summary (Last 24 hours) at 07/21/13 1542 Last data filed at 07/21/13 1507  Gross per 24 hour  Intake 1663.63 ml  Output   1050 ml  Net 613.63  ml   Filed Weights   07/19/13 0718 07/20/13 0612 07/21/13 0528  Weight: 93.7 kg (206 lb 9.1 oz) 92.9 kg (204 lb 12.9 oz) 92.08 kg (203 lb)    Exam:   General:  Pt in NAd, Alert and awake  Cardiovascular: RRR, no rubs or  gallops  Respiratory: prolonged expiratory phase, rhales, no wheezes  Abdomen: soft, NT, ND  Musculoskeletal: no cyanosis or clubbing   Data Reviewed: Basic Metabolic Panel:  Recent Labs Lab 07/16/13 0900 07/17/13 0505 07/18/13 0410 07/19/13 0450 07/20/13 0515  NA 136 135 134* 138 137  K 4.1 4.3 3.9 3.9 3.4*  CL 97 98 97 98 98  CO2 26 27 28  32 26  GLUCOSE 294* 322* 346* 231* 221*  BUN 40* 39* 38* 29* 25*  CREATININE 1.50* 1.45* 1.52* 1.55* 1.41*  CALCIUM 9.2 8.9 8.7 9.0 9.2   Liver Function Tests: No results found for this basename: AST, ALT, ALKPHOS, BILITOT, PROT, ALBUMIN,  in the last 168 hours No results found for this basename: LIPASE, AMYLASE,  in the last 168 hours No results found for this basename: AMMONIA,  in the last 168 hours CBC:  Recent Labs Lab 07/16/13 0851 07/17/13 0505 07/18/13 0410 07/20/13 0515  WBC 13.5* 15.6* 13.7* 6.8  NEUTROABS 12.2*  --   --   --   HGB 13.5 11.8* 11.8* 12.1*  HCT 39.8 35.6* 34.1* 34.8*  MCV 88.2 88.1 87.7 87.2  PLT 183 184 184 186   Cardiac Enzymes:  Recent Labs Lab 07/16/13 1457 07/16/13 2009 07/17/13 1457  TROPONINI 0.76* 2.91* 1.77*   BNP (last 3 results)  Recent Labs  07/16/13 0900  PROBNP 985.5*   CBG:  Recent Labs Lab 07/20/13 1201 07/20/13 1621 07/20/13 2149 07/21/13 0645 07/21/13 1107  GLUCAP 224* 184* 219* 184* 339*    Recent Results (from the past 240 hour(s))  CULTURE, BLOOD (ROUTINE X 2)     Status: None   Collection Time    07/16/13 10:20 AM      Result Value Range Status   Specimen Description BLOOD HAND RIGHT   Final   Special Requests BOTTLES DRAWN AEROBIC AND ANAEROBIC 10CC   Final   Culture  Setup Time 07/16/2013 16:52   Final   Culture     Final   Value:        BLOOD CULTURE RECEIVED NO GROWTH TO DATE CULTURE WILL BE HELD FOR 5 DAYS BEFORE ISSUING A FINAL NEGATIVE REPORT   Report Status PENDING   Incomplete  CULTURE, BLOOD (ROUTINE X 2)     Status: None   Collection Time     07/16/13 10:30 AM      Result Value Range Status   Specimen Description BLOOD ARM RIGHT   Final   Special Requests BOTTLES DRAWN AEROBIC ONLY 5.0CC   Final   Culture  Setup Time 07/16/2013 16:53   Final   Culture     Final   Value:        BLOOD CULTURE RECEIVED NO GROWTH TO DATE CULTURE WILL BE HELD FOR 5 DAYS BEFORE ISSUING A FINAL NEGATIVE REPORT   Report Status PENDING   Incomplete     Studies: No results found.  Scheduled Meds: . sodium chloride   Intravenous Once  . albuterol  2.5 mg Nebulization BID  . allopurinol  150 mg Oral Daily  . aspirin  81 mg Oral Daily  . atorvastatin  20 mg Oral q1800  . bisoprolol  5 mg  Oral Daily  . budesonide-formoterol  2 puff Inhalation BID  . calcitRIOL  0.25 mcg Oral Daily  . citalopram  20 mg Oral Daily  . clopidogrel  75 mg Oral Q breakfast  . diltiazem  180 mg Oral Daily  . docusate sodium  100 mg Oral BID  . ferrous sulfate  325 mg Oral Q breakfast  . finasteride  5 mg Oral Daily  . folic acid-pyridoxine-cyancobalamin  1 tablet Oral Daily  . furosemide  120 mg Oral QPM  . furosemide  80 mg Oral q morning - 10a  . insulin aspart  0-15 Units Subcutaneous TID WC  . insulin aspart  0-5 Units Subcutaneous QHS  . insulin detemir  5 Units Subcutaneous QHS  . isosorbide mononitrate  60 mg Oral Daily  . levofloxacin  750 mg Oral Q48H  . pantoprazole  40 mg Oral Daily  . senna  1 tablet Oral Daily  . sodium chloride  3 mL Intravenous Q12H  . sodium chloride  3 mL Intravenous Q12H  . tiotropium  18 mcg Inhalation Daily  . traZODone  100 mg Oral QHS   Continuous Infusions:    Active Problems:   ANEMIA, IRON DEFICIENCY   DIASTOLIC HEART FAILURE, CHRONIC   COPD   DM (diabetes mellitus)   Sepsis   Elevated troponin   NSTEMI (non-ST elevated myocardial infarction)   Difficulty swallowing solids    Time spent: > 35 minutes    Penny Pia  [facility] Pager [phone]. If 7PM-7AM, please contact night-coverage at  [email or web address], password [email or web address] 07/21/2013, 3:42 PM  LOS: 5 days

## 2013-07-22 DIAGNOSIS — E119 Type 2 diabetes mellitus without complications: Secondary | ICD-10-CM | POA: Diagnosis not present

## 2013-07-22 DIAGNOSIS — I509 Heart failure, unspecified: Secondary | ICD-10-CM | POA: Diagnosis not present

## 2013-07-22 DIAGNOSIS — J189 Pneumonia, unspecified organism: Secondary | ICD-10-CM | POA: Diagnosis not present

## 2013-07-22 DIAGNOSIS — I214 Non-ST elevation (NSTEMI) myocardial infarction: Secondary | ICD-10-CM | POA: Diagnosis not present

## 2013-07-22 DIAGNOSIS — I2510 Atherosclerotic heart disease of native coronary artery without angina pectoris: Secondary | ICD-10-CM | POA: Diagnosis not present

## 2013-07-22 LAB — GLUCOSE, CAPILLARY: Glucose-Capillary: 293 mg/dL — ABNORMAL HIGH (ref 70–99)

## 2013-07-22 LAB — CULTURE, BLOOD (ROUTINE X 2)
Culture: NO GROWTH
Culture: NO GROWTH
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
## 2013-07-22 NOTE — Discharge Summary (Signed)
Physician Discharge Summary  [name]. ZOX:[id number] DOB: [birth year] DOA: 07/16/2013  PCP: Romero Belling, MD  Admit date: 07/16/2013 Discharge date: 07/23/2013  Time spent: 30 minutes  Recommendations for Outpatient Follow-up:   1,Sepsis D/C in Am -On levaquin on dy 6/7, tolerating well and continues to show improvement. Will discharge patient in a.m. on Monday of Levaquin -Urine Legionella antigen, urine Streptococcus pneumoniae antigen both negative  2. Elevated proBNP/chronic diastolic CHF  Secondary to patient's in NSTEMI. Resolving, patient to followup with cardiology as an outpatient. Note patient was counseled that his cardiac catheterization showed RCA occlusion with collaterals noted   3. Difficulty swallowing solids We'll restart Plavix in the a.m. Patient has had a swallow test which showed no oropharygneal dysphagia. Patient to followup as outpatient with GI further workup  4. COPD  -Continue Spiriva  -No wheezing on exam  -Albuterol nebulizer every 6 hours  -Continue Symbicort   5.Diabetes mellitus type 2  -Hemoglobin A1c 7.0 on 05/26/2013  -NovoLog sliding scale   6. Hyperlipidemia  -Continue statin   7. Lower extremity edema/pain  -Venous Dopplers with no evidence of deep vein or superficial thrombosis Constipation  -Continue Colace and Senokot   8. Chest pain/NSTEMI  NSTEMI - RCA occlusion with collaterals noted at cath, suspect chronic. Med management recommended. This may have been a 'Type 2' event related to demand ischemia.  2. Acute on chronic combined CHF. Hemodynamics reviewed and reasonably well compensated at present. Recommend continue current medical management. Pt on high-dose oral diuretics, beta-blocker.    Code Status: full Family Communication: no family at bedside.  Disposition Plan: Once dysphagia is further evaluated may consider d/c.  Discharge Diagnoses:  Active Problems:   ANEMIA, IRON DEFICIENCY   DIASTOLIC HEART  FAILURE, CHRONIC   COPD   DM (diabetes mellitus)   Sepsis   Elevated troponin   NSTEMI (non-ST elevated myocardial infarction)   Difficulty swallowing solids   Discharge Condition: Stable   Diet recommendation: Heart healthy   Antibiotics   Levaquin day 6/7  Zosyn d/c 7/26 DC  Vancomycin d/c 7/26    Filed Weights   07/21/13 0528 07/22/13 0555 07/23/13 0604  Weight: 92.08 kg (203 lb) 93.441 kg (206 lb) 93.033 kg (205 lb 1.6 oz)    History of present illness:  72 y/o WM PMHx HLD, HTN, CHF, CVA w/ left hemiparesis, DM, COPD on 2 L supplemental O2 at night. Presented with chest tightness and shortness of breath.  Patient diagnosed with PNA after chest xray in ED.  During course of admission developed chest discomfort and patient had elevated troponins.  Cardiology consulted and patient is s/p cath. Nearing d/c patient started to complain of dysphagia with solids.  Reports feeling better as far as his respiratory condition is concerned.  Patient reports difficulty swallowing specifically solids. This problem is new (started approximately 2 weeks ago) to him and he reports it has got worse since hospitalization. Patient has been evaluated by speech for swallowing and cleared to consume food. Further workup will be done as an outpatient with GI      Consultants:  Cardiology: Excell Seltzer  Procedures:  Lower extremity venous duplex  Swallow Test 07/22/2013 Pt presents with no overt s/s of aspiration/penetration at bedside across PO trials; however, he was observed to almost immediately regurgitate water and applesauce trials. Observed regurgitation and eructation along with pt's c/o a globus sensation appear to be esophageal and/or GI in nature. Of note, pt reports that his symptoms originally started after  his lapband surgery and have increased recently since tightening. Recommend to consider further GI work-up. No further ST services needed at this time as pt does not show signs of an  oropharygneal dysphagia. Please re-order as needed. Consultations:    Discharge Exam: Filed Vitals:   07/22/13 1300 07/22/13 2058 07/22/13 2059 07/23/13 0604  BP: 132/58  156/61 162/56  Pulse: 57 57 68 64  Temp: 97.9 F (36.6 C)  97.8 F (36.6 C) 98.1 F (36.7 C)  TempSrc: Oral  Oral Oral  Resp: 18 18 18 18   Height:      Weight:    93.033 kg (205 lb 1.6 oz)  SpO2: 96% 98% 98% 100%     General:  Pt in NAd, Alert and awake  Cardiovascular: RRR, no rubs or gallops  Respiratory: prolonged expiratory phase, rhales, no wheezes  Abdomen: soft, NT, ND  Musculoskeletal: no cyanosis or clubbing    Discharge Instructions   Future Appointments Provider Department Dept Phone   07/23/2013 9:30 AM [facility] [phone]   07/28/2013 9:30 AM [facility] [phone]   09/08/2013 1:00 PM Romero Belling, MD [facility] PRIMARY CARE ENDOCRINOLOGY [phone]   09/10/2013 11:30 AM [facility], [place] [phone]   09/30/2013 11:00 AM Barbaraann Share, MD [facility] [phone]       Medication List    ASK your doctor about these medications       acarbose 25 MG tablet  Commonly known as:  PRECOSE  Take 25 mg by mouth 3 (three) times daily with meals.     allopurinol 300 MG tablet  Commonly known as:  ZYLOPRIM  TAKE 1 TABLET BY MOUTH DAILY     amoxicillin-clavulanate 875-125 MG per tablet  Commonly known as:  AUGMENTIN  Take 1 tablet by mouth 2 (two) times daily.     aspirin 81 MG tablet  Take 81 mg by mouth daily.     ASTEPRO 0.15 % Soln  Generic drug:  Azelastine HCl  SPRAY TWICE IN EACH NOSTRIL EVERY MORNING AS NEEDED     bisoprolol 5 MG tablet  Commonly known as:  ZEBETA  Take 1 tablet (5 mg total) by mouth daily.     budesonide-formoterol 160-4.5 MCG/ACT inhaler  Commonly known as:  SYMBICORT  Inhale 2  puffs into the lungs 2 (two) times daily.     calcitRIOL 0.25 MCG capsule  Commonly known as:  ROCALTROL  Take 1 capsule (0.25 mcg total) by mouth daily.     citalopram 20 MG tablet  Commonly known as:  CELEXA  Take 1 tablet (20 mg total) by mouth daily.     ferrous sulfate 325 (65 FE) MG tablet  Take 325 mg by mouth daily with breakfast.     finasteride 5 MG tablet  Commonly known as:  PROSCAR  Take 1 tablet (5 mg total) by mouth daily.     folic acid-pyridoxine-cyancobalamin 2.5-25-2 MG Tabs  Commonly known as:  FOLBIC  Take 1 tablet by mouth daily.     furosemide 40 MG tablet  Commonly known as:  LASIX  Take 1 tablet (40 mg total) by mouth as directed. 80 MG IN THE AM AND 120 MG IN THE PM     glucose blood test strip  Four times a day, variable glucoses dx 250.03     HYDROcodone-acetaminophen 10-325 MG per tablet  Commonly known as:  NORCO  Take 1 tablet by mouth every 6 (six) hours as needed for pain.     isosorbide mononitrate 60 MG 24 hr tablet  Commonly known as:  IMDUR  Take 1 tablet (60 mg total) by mouth daily.     MIRALAX powder  Generic drug:  polyethylene glycol powder  Take 17 g by mouth as needed.     MULTIVITAMIN PO  Take 1 tablet by mouth daily.     NITROLINGUAL 0.4 MG/SPRAY spray  Generic drug:  nitroGLYCERIN  Place 1 spray under the tongue as directed.     NOVOLOG FLEXPEN [place]  Inject 10 Units into the skin 3 (three) times daily with meals. And pen needles 3/day     omeprazole 20 MG capsule  Commonly known as:  PRILOSEC  TAKE ONE CAPSULE BY MOUTH DAILY     predniSONE 10 MG tablet  Commonly known as:  DELTASONE  Take 4 tabs po x 2 days, then 3 x 2 days, then 2 x 2 days, then 1 x 2 days then stop.     rosuvastatin 10 MG tablet  Commonly known as:  CRESTOR  TAKE 1 TABLET BY MOUTH ONCE DAILY     TAZTIA XT 180 MG 24 hr capsule  Generic drug:  diltiazem  TAKE ONE CAPSULE BY MOUTH DAILY     tiotropium 18 MCG inhalation capsule  Commonly  known as:  SPIRIVA  Place 18 mcg into inhaler and inhale daily.     traZODone 100 MG tablet  Commonly known as:  DESYREL  TAKE 1 TABLET BY MOUTH EVERY NIGHT AT BEDTIME     triamcinolone 0.025 % cream  Commonly known as:  KENALOG  APPLY THREE TIMES DAILY ON AFFECTED AREA AS NEEDED     UNJURY UNFLAVORED PO  Take 8 oz by mouth daily.     VENTOLIN HFA 108 (90 BASE) MCG/ACT inhaler  Generic drug:  albuterol  Inhale 2 puffs into the lungs every 6 (six) hours as needed.       Allergies  Allergen Reactions  . Enalapril Maleate     REACTION: cough  . Shellfish-Derived Products Swelling      The results of significant diagnostics from this hospitalization (including imaging, microbiology, ancillary and laboratory) are listed below for reference.    Significant Diagnostic Studies: Dg Chest 2 View  07/18/2013   *RADIOLOGY REPORT*  Clinical Data: Shortness of breath  CHEST - 2 VIEW  Comparison: July 16, 2013  Findings: There is underlying emphysema.  There is extensive although patchy infiltrate throughout much of the right lung. There has been some mild partial clearing on the right compared to 2 days prior.  There is mild atelectatic change in the left base.  Heart is mildly enlarged with normal pulmonary vascularity.  No adenopathy.  There is degenerative type change in the thoracic spine.  IMPRESSION: There has been slight partial clearing of infiltrate on the right, although there is fairly extensive patchy infiltrate on the right. There is underlying emphysema.  There is mild atelectatic change in the left base.   Original Report Authenticated By: Bretta Bang, M.D.   Dg Chest Portable 1 View  07/16/2013   *RADIOLOGY REPORT*  Clinical Data: Right chest pain with shortness of breath.  History of COPD, diabetes and congestive heart failure.  PORTABLE CHEST - 1 VIEW  Comparison: CT and radiographs 10/21/2011.  Findings: 0842 hours.  There is stable cardiomegaly and aortic  atherosclerosis.  There are new patchy asymmetric air  space opacities in the right lung with some nodular components.  The left lung is clear.  There is no definite edema or significant pleural effusion.  Osseous structures appear unchanged.  IMPRESSION: New asymmetric nodular opacities in the right lung, probably reflecting multifocal pneumonia.  Radiographic followup is necessary to document clearing and exclude an underlying mass lesion.   Original Report Authenticated By: Carey Bullocks, M.D.    Microbiology: Recent Results (from the past 240 hour(s))  CULTURE, BLOOD (ROUTINE X 2)     Status: None   Collection Time    07/16/13 10:20 AM      Result Value Range Status   Specimen Description BLOOD HAND RIGHT   Final   Special Requests BOTTLES DRAWN AEROBIC AND ANAEROBIC 10CC   Final   Culture  Setup Time 07/16/2013 16:52   Final   Culture NO GROWTH 5 DAYS   Final   Report Status 07/22/2013 FINAL   Final  CULTURE, BLOOD (ROUTINE X 2)     Status: None   Collection Time    07/16/13 10:30 AM      Result Value Range Status   Specimen Description BLOOD ARM RIGHT   Final   Special Requests BOTTLES DRAWN AEROBIC ONLY 5.0CC   Final   Culture  Setup Time 07/16/2013 16:53   Final   Culture NO GROWTH 5 DAYS   Final   Report Status 07/22/2013 FINAL   Final     Labs: Basic Metabolic Panel:  Recent Labs Lab 07/16/13 0900 07/17/13 0505 07/18/13 0410 07/19/13 0450 07/20/13 0515  NA 136 135 134* 138 137  K 4.1 4.3 3.9 3.9 3.4*  CL 97 98 97 98 98  CO2 26 27 28  32 26  GLUCOSE 294* 322* 346* 231* 221*  BUN 40* 39* 38* 29* 25*  CREATININE 1.50* 1.45* 1.52* 1.55* 1.41*  CALCIUM 9.2 8.9 8.7 9.0 9.2   Liver Function Tests: No results found for this basename: AST, ALT, ALKPHOS, BILITOT, PROT, ALBUMIN,  in the last 168 hours No results found for this basename: LIPASE, AMYLASE,  in the last 168 hours No results found for this basename: AMMONIA,  in the last 168 hours CBC:  Recent Labs Lab  07/16/13 0851 07/17/13 0505 07/18/13 0410 07/20/13 0515  WBC 13.5* 15.6* 13.7* 6.8  NEUTROABS 12.2*  --   --   --   HGB 13.5 11.8* 11.8* 12.1*  HCT 39.8 35.6* 34.1* 34.8*  MCV 88.2 88.1 87.7 87.2  PLT 183 184 184 186   Cardiac Enzymes:  Recent Labs Lab 07/16/13 1457 07/16/13 2009 07/17/13 1457  TROPONINI 0.76* 2.91* 1.77*   BNP: BNP (last 3 results)  Recent Labs  07/16/13 0900  PROBNP 985.5*   CBG:  Recent Labs Lab 07/21/13 2138 07/22/13 0603 07/22/13 1136 07/22/13 1609 07/22/13 2052  GLUCAP 305* 189* 293* 175* 247*       Signed:  Shamara Soza, J  [facility] 07/23/2013, 6:41 AM

## 2013-07-22 NOTE — Progress Notes (Signed)
    SUBJECTIVE: No chest pain or SOB.   BP 171/57  Pulse 64  Temp(Src) 97.6 F (36.4 C) (Oral)  Resp 18  Ht 5\' 3"  (1.6 m)  Wt 206 lb (93.441 kg)  BMI 36.5 kg/m2  SpO2 97%  Intake/Output Summary (Last 24 hours) at 07/22/13 1610 Last data filed at 07/22/13 9604  Gross per 24 hour  Intake   1443 ml  Output   2100 ml  Net   -657 ml    PHYSICAL EXAM General: Well developed, well nourished, in no acute distress. Alert and oriented x 3.  Psych:  Good affect, responds appropriately Neck: No JVD. No masses noted.  Lungs: Clear bilaterally with no wheezes or rhonci noted.  Heart: RRR with no murmurs noted. Abdomen: Bowel sounds are present. Soft, non-tender.  Extremities: No lower extremity edema.   LABS: Basic Metabolic Panel:  Recent Labs  54/09/81 0515  NA 137  K 3.4*  CL 98  CO2 26  GLUCOSE 221*  BUN 25*  CREATININE 1.41*  CALCIUM 9.2   CBC:  Recent Labs  07/20/13 0515  WBC 6.8  HGB 12.1*  HCT 34.8*  MCV 87.2  PLT 186   Current Meds: . sodium chloride   Intravenous Once  . albuterol  2.5 mg Nebulization BID  . allopurinol  150 mg Oral Daily  . aspirin  81 mg Oral Daily  . atorvastatin  20 mg Oral q1800  . bisoprolol  5 mg Oral Daily  . budesonide-formoterol  2 puff Inhalation BID  . calcitRIOL  0.25 mcg Oral Daily  . citalopram  20 mg Oral Daily  . clopidogrel  75 mg Oral Q breakfast  . diltiazem  180 mg Oral Daily  . docusate sodium  100 mg Oral BID  . ferrous sulfate  325 mg Oral Q breakfast  . finasteride  5 mg Oral Daily  . folic acid-pyridoxine-cyancobalamin  1 tablet Oral Daily  . furosemide  120 mg Oral QPM  . furosemide  80 mg Oral q morning - 10a  . insulin aspart  0-15 Units Subcutaneous TID WC  . insulin aspart  0-5 Units Subcutaneous QHS  . insulin detemir  5 Units Subcutaneous QHS  . isosorbide mononitrate  60 mg Oral Daily  . levofloxacin  750 mg Oral Q48H  . pantoprazole  40 mg Oral Daily  . senna  1 tablet Oral Daily  .  sodium chloride  3 mL Intravenous Q12H  . sodium chloride  3 mL Intravenous Q12H  . tiotropium  18 mcg Inhalation Daily  . traZODone  100 mg Oral QHS     ASSESSMENT AND PLAN:   1. NSTEMI: RCA occlusion with collaterals noted at cath, suspect chronic. Med management recommended. This may have been a 'Type 2' event related to demand ischemia.   2. Acute on chronic combined CHF:  Appears to be compensated at present. Recommend continue current medical management. Pt on high-dose oral diuretics, beta-blocker.   3. Swallowing difficulty: Speech therapy consult pending for swallowing study. May need GI eval. Plavix on hold in case he needs EGD/dilatation/etc.   Verne Carrow  7/30/20147:22 AM

## 2013-07-23 ENCOUNTER — Encounter (INDEPENDENT_AMBULATORY_CARE_PROVIDER_SITE_OTHER): Payer: Self-pay

## 2013-07-23 ENCOUNTER — Encounter (HOSPITAL_COMMUNITY): Payer: Self-pay

## 2013-07-23 ENCOUNTER — Ambulatory Visit (INDEPENDENT_AMBULATORY_CARE_PROVIDER_SITE_OTHER): Payer: Medicare Other | Admitting: Physician Assistant

## 2013-07-23 VITALS — BP 130/58 | HR 76 | Temp 98.3°F | Resp 15 | Ht 63.0 in | Wt 207.4 lb

## 2013-07-23 DIAGNOSIS — Z4651 Encounter for fitting and adjustment of gastric lap band: Secondary | ICD-10-CM | POA: Diagnosis not present

## 2013-07-23 LAB — GLUCOSE, CAPILLARY: Glucose-Capillary: 342 mg/dL — ABNORMAL HIGH (ref 70–99)

## 2013-07-23 MED ORDER — LEVOFLOXACIN 750 MG PO TABS: 750.0000 mg | ORAL_TABLET | ORAL | Status: DC

## 2013-07-23 MED ORDER — CLOPIDOGREL BISULFATE 75 MG PO TABS: 75.0000 mg | ORAL_TABLET | Freq: Every day | ORAL | Status: DC

## 2013-07-23 MED ORDER — FUROSEMIDE 40 MG PO TABS: 120.0000 mg | ORAL_TABLET | Freq: Every evening | ORAL | Status: DC

## 2013-07-23 MED ORDER — AZELASTINE HCL 0.1 % NA SOLN: 2.0000 | Freq: Two times a day (BID) | NASAL | Status: DC | PRN

## 2013-07-23 NOTE — Progress Notes (Signed)
  HISTORY: [name]. is a 72 y.o.male who received an AP-Large lap-band in June 2012 by Dr. Ezzard Standing. He comes in having just been discharged from the hospital for pneumonia. He reports having solid food dysphagia, nocturnal reflux and occasional night cough about one week after his last adjustment. He was going to come to the clinic last week but he awoke with shaking chills which prompted the admission. He is feeling better from a fever/pulmonary perspective but is continuing to have issues swallowing. Even liquids can be a challenge.  VITAL SIGNS: Filed Vitals:   07/23/13 1334  BP: 130/58  Pulse: 76  Temp: 98.3 F (36.8 C)  Resp: 15    PHYSICAL EXAM: Physical exam reveals a very well-appearing 72 y.o.male in no apparent distress Neurologic: Awake, alert, oriented Psych: Bright affect, conversant Respiratory: Breathing even and unlabored. No stridor or wheezing Abdomen: Soft, nontender, nondistended to palpation. Incisions well-healed. No incisional hernias. Port easily palpated. Extremities: Atraumatic, good range of motion.  ASSESMENT: 72 y.o.  male  s/p AP-Large lap-band.   PLAN: The patient's port was accessed with a 20G Huber needle without difficulty. Clear fluid was aspirated and 6 mL saline was removed from the port to give a total predicted volume of 3.25 mL. The patient was advised to concentrate on healthy food choices and to avoid slider foods high in fats and carbohydrates. He was able to drink water with far greater ease following fluid removal. I'll have him return in three weeks or sooner if he continues to have issues.

## 2013-07-23 NOTE — Progress Notes (Signed)
All d/c instructions explained as given.  Verbalized understanding.  D/c off floor via w/c at  1117 to awaiting transport to home.  Amanda Pea, Charity fundraiser.

## 2013-07-23 NOTE — Patient Instructions (Signed)
Return in three weeks. Focus on good food choices as well as physical activity. Return sooner if you have an increase in hunger, portion sizes or weight. Return also for difficulty swallowing, night cough, reflux.   

## 2013-07-23 NOTE — Progress Notes (Signed)
Received patient complaining of pain on his right leg, with pain scale of 4/10. Norco PRN was given as ordered. On room air, not in distress. Other initial assessment, please see flow sheet. Will monitor patient accordingly.

## 2013-07-24 ENCOUNTER — Ambulatory Visit (INDEPENDENT_AMBULATORY_CARE_PROVIDER_SITE_OTHER)
Admission: RE | Admit: 2013-07-24 | Discharge: 2013-07-24 | Disposition: A | Discharge status: Home / Self Care | Payer: Medicare Other | Source: Ambulatory Visit | Attending: Adult Health | Admitting: Adult Health

## 2013-07-24 ENCOUNTER — Encounter: Payer: Self-pay | Admitting: Adult Health

## 2013-07-24 ENCOUNTER — Ambulatory Visit (INDEPENDENT_AMBULATORY_CARE_PROVIDER_SITE_OTHER): Payer: Medicare Other | Admitting: Adult Health

## 2013-07-24 VITALS — BP 114/60 | HR 68 | Temp 98.0°F | Ht 62.0 in | Wt 208.8 lb

## 2013-07-24 DIAGNOSIS — J189 Pneumonia, unspecified organism: Secondary | ICD-10-CM | POA: Diagnosis not present

## 2013-07-24 DIAGNOSIS — J984 Other disorders of lung: Secondary | ICD-10-CM | POA: Diagnosis not present

## 2013-07-24 DIAGNOSIS — J449 Chronic obstructive pulmonary disease, unspecified: Secondary | ICD-10-CM

## 2013-07-24 DIAGNOSIS — A419 Sepsis, unspecified organism: Secondary | ICD-10-CM

## 2013-07-24 DIAGNOSIS — I5032 Chronic diastolic (congestive) heart failure: Secondary | ICD-10-CM

## 2013-07-24 DIAGNOSIS — I214 Non-ST elevation (NSTEMI) myocardial infarction: Secondary | ICD-10-CM | POA: Diagnosis not present

## 2013-07-24 NOTE — Patient Instructions (Addendum)
Finish Levaquin .  Mucinex DM Twice daily  As needed  Cough/congestion  Follow up with Cardiology as planned  Ffollow up Dr. Shelle Iron in 3 weeks with chest xray

## 2013-07-24 NOTE — Progress Notes (Signed)
  Subjective:    Patient ID: [name]., male    DOB: 08-30-1941, 72 y.o.   MRN: [id number]  HPI 72 yo with known hx of COPD   07/24/2013 Post Hospital follow up  Patient returns for a post hospital followup. He was admitted July 24 of the 31st for possible CAP with sepsis, decompensated diastolic CHF and NSTEMI .  NSTEMI - seen by cardsRCA occlusion with collaterals noted at cath, suspect chronic. Med management recommended. This may have been a 'Type 2' event related to demand ischemia. Tx with abx -levaquin  Since discharge he is feeling better but still weak.  Dyspnea is at baseline , no flare of cough or wheezing.      Review of Systems Constitutional:   No  weight loss, night sweats,  Fevers, chills, + fatigue, or  lassitude.  HEENT:   No headaches,  Difficulty swallowing,  Tooth/dental problems, or  Sore throat,                No sneezing, itching, ear ache, nasal congestion, post nasal drip,   CV:  No chest pain,  Orthopnea, PND,  , anasarca, dizziness, palpitations, syncope.   GI  No heartburn, indigestion, abdominal pain, nausea, vomiting, diarrhea, change in bowel habits, loss of appetite, bloody stools.   Resp:   No coughing up of blood.  No change in color of mucus.  No wheezing.  No chest wall deformity  Skin: no rash or lesions.  GU: no dysuria, change in color of urine, no urgency or frequency.  No flank pain, no hematuria   MS:  No joint pain or swelling.  No decreased range of motion.  No back pain.  Psych:  No change in mood or affect. No depression or anxiety.  No memory loss.          Objective:   Physical Exam GEN: A/Ox3; pleasant , NAD  HEENT:  [place]/AT,  EACs-clear, TMs-wnl, NOSE-clear, THROAT-clear, no lesions, no postnasal drip or exudate noted.   NECK:  Supple w/ fair ROM; no JVD; normal carotid impulses w/o bruits; no thyromegaly or nodules palpated; no lymphadenopathy.  RESP  Diminished BS in bases , no accessory muscle use, no dullness  to percussion  CARD:  RRR, no m/r/g  , tr-1+ peripheral edema, pulses intact, no cyanosis or clubbing.  GI:   Soft & nt; nml bowel sounds; no organomegaly or masses detected.  Musco: Warm bil, no deformities or joint swelling noted.   Neuro: alert, no focal deficits noted.    Skin: Warm, no lesions or rashes         Assessment & Plan:

## 2013-07-27 ENCOUNTER — Other Ambulatory Visit: Payer: Self-pay | Admitting: Endocrinology

## 2013-07-28 ENCOUNTER — Encounter (HOSPITAL_COMMUNITY): Payer: Medicare Other | Attending: Pulmonary Disease

## 2013-07-28 DIAGNOSIS — I5032 Chronic diastolic (congestive) heart failure: Secondary | ICD-10-CM | POA: Insufficient documentation

## 2013-07-28 DIAGNOSIS — J4489 Other specified chronic obstructive pulmonary disease: Secondary | ICD-10-CM | POA: Insufficient documentation

## 2013-07-28 DIAGNOSIS — E785 Hyperlipidemia, unspecified: Secondary | ICD-10-CM | POA: Insufficient documentation

## 2013-07-28 DIAGNOSIS — I2510 Atherosclerotic heart disease of native coronary artery without angina pectoris: Secondary | ICD-10-CM | POA: Insufficient documentation

## 2013-07-28 DIAGNOSIS — Z5189 Encounter for other specified aftercare: Secondary | ICD-10-CM | POA: Insufficient documentation

## 2013-07-28 DIAGNOSIS — J449 Chronic obstructive pulmonary disease, unspecified: Secondary | ICD-10-CM | POA: Insufficient documentation

## 2013-07-28 DIAGNOSIS — I129 Hypertensive chronic kidney disease with stage 1 through stage 4 chronic kidney disease, or unspecified chronic kidney disease: Secondary | ICD-10-CM | POA: Insufficient documentation

## 2013-07-28 DIAGNOSIS — E119 Type 2 diabetes mellitus without complications: Secondary | ICD-10-CM | POA: Insufficient documentation

## 2013-07-28 DIAGNOSIS — I739 Peripheral vascular disease, unspecified: Secondary | ICD-10-CM | POA: Insufficient documentation

## 2013-07-28 DIAGNOSIS — I10 Essential (primary) hypertension: Secondary | ICD-10-CM | POA: Insufficient documentation

## 2013-07-28 DIAGNOSIS — N183 Chronic kidney disease, stage 3 unspecified: Secondary | ICD-10-CM | POA: Insufficient documentation

## 2013-07-29 NOTE — Assessment & Plan Note (Addendum)
Recent admission with CAP Improved with abx  Cxr shows persistent opacities   Plan  Finish Levaquin .  Mucinex DM Twice daily  As needed  Cough/congestion  Follow up with Cardiology as planned  Ffollow up Dr. Shelle Iron in 3 weeks with chest xray

## 2013-07-29 NOTE — Assessment & Plan Note (Signed)
follow up with cards as planned

## 2013-07-29 NOTE — Assessment & Plan Note (Signed)
Compensated on present regimen.   

## 2013-07-29 NOTE — Assessment & Plan Note (Signed)
Recent hospitaization with decompensated diastolic failure  Improved with diuresis  follow up planned with cards

## 2013-07-31 ENCOUNTER — Encounter: Payer: Self-pay | Admitting: Gastroenterology

## 2013-08-03 ENCOUNTER — Other Ambulatory Visit: Payer: Self-pay

## 2013-08-03 MED ORDER — NITROGLYCERIN 0.4 MG/SPRAY TL SOLN: 1.0000 | Status: DC

## 2013-08-11 ENCOUNTER — Ambulatory Visit: Payer: Medicare Other | Admitting: Endocrinology

## 2013-08-12 ENCOUNTER — Ambulatory Visit (INDEPENDENT_AMBULATORY_CARE_PROVIDER_SITE_OTHER): Payer: Medicare Other | Admitting: Physician Assistant

## 2013-08-12 ENCOUNTER — Encounter: Payer: Self-pay | Admitting: Physician Assistant

## 2013-08-12 VITALS — BP 140/78 | HR 65 | Ht 62.0 in | Wt 213.0 lb

## 2013-08-12 DIAGNOSIS — I509 Heart failure, unspecified: Secondary | ICD-10-CM | POA: Diagnosis not present

## 2013-08-12 DIAGNOSIS — I5032 Chronic diastolic (congestive) heart failure: Secondary | ICD-10-CM | POA: Diagnosis not present

## 2013-08-12 DIAGNOSIS — E785 Hyperlipidemia, unspecified: Secondary | ICD-10-CM | POA: Diagnosis not present

## 2013-08-12 DIAGNOSIS — E876 Hypokalemia: Secondary | ICD-10-CM | POA: Diagnosis not present

## 2013-08-12 DIAGNOSIS — B379 Candidiasis, unspecified: Secondary | ICD-10-CM | POA: Insufficient documentation

## 2013-08-12 LAB — BASIC METABOLIC PANEL
Calcium: 9.2 mg/dL (ref 8.4–10.5)
Creatinine, Ser: 1.4 mg/dL (ref 0.4–1.5)
Sodium: 134 mEq/L — ABNORMAL LOW (ref 135–145)

## 2013-08-12 MED ORDER — FUROSEMIDE 40 MG PO TABS: 40.0000 mg | ORAL_TABLET | ORAL | Status: DC

## 2013-08-12 NOTE — Assessment & Plan Note (Signed)
Patient's MI was felt probably due to demand ischemia secondary to pneumonia. He does have an RCA occlusion with collaterals question chronicity. Recommend medical therapy and Plavix. PCI would be considered if refractory chest pain.

## 2013-08-12 NOTE — Assessment & Plan Note (Signed)
See cath details above. Patient had recent MI felt secondary to demand ischemia. No further chest pain as an outpatient. Continue medical therapy.

## 2013-08-12 NOTE — Assessment & Plan Note (Signed)
Patient has yeast infection at cardiac cath site in the right groin. Have instructed the patient to clean it and keep it dry. He has an antifungal at home to treat it with.

## 2013-08-12 NOTE — Patient Instructions (Addendum)
Your physician has recommended you make the following change in your medication:  CHANGE LASIX TO 120 MG IN THE AM AND 80 MG IN THE PM  Your physician recommends that you schedule a follow-up appointment in:  2 MONTH WITH DR. Clifton James  You have been referred to [facility]  Your physician recommends that you return for lab work in:  [employer]
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
## 2013-08-12 NOTE — Assessment & Plan Note (Signed)
Patient has mild ankle edema. Recommend 2 g sodium diet. No evidence of overt heart failure. Check renal function today on current Lasix dose

## 2013-08-13 ENCOUNTER — Encounter (INDEPENDENT_AMBULATORY_CARE_PROVIDER_SITE_OTHER): Payer: Self-pay

## 2013-08-13 ENCOUNTER — Ambulatory Visit (INDEPENDENT_AMBULATORY_CARE_PROVIDER_SITE_OTHER): Payer: Medicare Other | Admitting: Physician Assistant

## 2013-08-13 VITALS — BP 112/60 | HR 64 | Resp 16 | Ht 63.0 in | Wt 212.4 lb

## 2013-08-13 DIAGNOSIS — Z4651 Encounter for fitting and adjustment of gastric lap band: Secondary | ICD-10-CM

## 2013-08-13 NOTE — Progress Notes (Signed)
  HISTORY: [name]. is a 72 y.o.male who received an AP-Large lap-band in June 2012 by Dr. Ezzard Standing. He comes in today with 5 lbs of weight gain since removal of 6 mL of fluid last month. This followed hospitalization for pneumonia from which he has recovered well. He has no issues with obstruction and is eating rather well. He'd like to get back on track with weight loss with fills, albeit conservatively.  VITAL SIGNS: Filed Vitals:   08/13/13 1327  BP: 112/60  Pulse: 64  Resp: 16    PHYSICAL EXAM: Physical exam reveals a very well-appearing 72 y.o.male in no apparent distress Neurologic: Awake, alert, oriented Psych: Bright affect, conversant Respiratory: Breathing even and unlabored. No stridor or wheezing Abdomen: Soft, nontender, nondistended to palpation. Incisions well-healed. No incisional hernias. Port easily palpated. Extremities: Atraumatic, good range of motion.  ASSESMENT: 72 y.o.  male  s/p AP-Large lap-band.   PLAN: The patient's port was accessed with a 20G Huber needle without difficulty. Clear fluid was aspirated and 3 mL saline was added to the port to give a total predicted volume of 6.25 mL. The patient was able to swallow water without difficulty following the procedure and was instructed to take clear liquids for the next 24-48 hours and advance slowly as tolerated.

## 2013-08-13 NOTE — Patient Instructions (Signed)

## 2013-08-14 DIAGNOSIS — L538 Other specified erythematous conditions: Secondary | ICD-10-CM | POA: Diagnosis not present

## 2013-08-14 DIAGNOSIS — L570 Actinic keratosis: Secondary | ICD-10-CM | POA: Diagnosis not present

## 2013-08-14 DIAGNOSIS — Z85828 Personal history of other malignant neoplasm of skin: Secondary | ICD-10-CM | POA: Diagnosis not present

## 2013-08-14 DIAGNOSIS — D239 Other benign neoplasm of skin, unspecified: Secondary | ICD-10-CM | POA: Diagnosis not present

## 2013-08-17 ENCOUNTER — Telehealth: Payer: Self-pay

## 2013-08-17 MED ORDER — HYDROCODONE-ACETAMINOPHEN 10-325 MG PO TABS: 1.0000 | ORAL_TABLET | Freq: Four times a day (QID) | ORAL | Status: DC | PRN

## 2013-08-17 NOTE — Telephone Encounter (Signed)
i printed 

## 2013-08-17 NOTE — Telephone Encounter (Signed)
Pt left voicemail stating he needs a rx for hyrdocodone, pt woukd like to know why rx was denied [phone]

## 2013-08-17 NOTE — Telephone Encounter (Signed)
rx faxed

## 2013-08-26 ENCOUNTER — Ambulatory Visit: Payer: Medicare Other | Admitting: Endocrinology

## 2013-08-30 ENCOUNTER — Other Ambulatory Visit: Payer: Self-pay | Admitting: Endocrinology

## 2013-08-31 ENCOUNTER — Other Ambulatory Visit: Payer: Self-pay | Admitting: Endocrinology

## 2013-09-03 DIAGNOSIS — Z23 Encounter for immunization: Secondary | ICD-10-CM | POA: Diagnosis not present

## 2013-09-04 ENCOUNTER — Encounter: Payer: Self-pay | Admitting: Endocrinology

## 2013-09-07 ENCOUNTER — Other Ambulatory Visit: Payer: Self-pay | Admitting: Endocrinology

## 2013-09-07 ENCOUNTER — Encounter: Payer: Self-pay | Admitting: Pulmonary Disease

## 2013-09-07 ENCOUNTER — Other Ambulatory Visit: Payer: Self-pay | Admitting: *Deleted

## 2013-09-07 ENCOUNTER — Ambulatory Visit (INDEPENDENT_AMBULATORY_CARE_PROVIDER_SITE_OTHER)
Admission: RE | Admit: 2013-09-07 | Discharge: 2013-09-07 | Disposition: A | Discharge status: Home / Self Care | Payer: Medicare Other | Source: Ambulatory Visit | Attending: Pulmonary Disease | Admitting: Pulmonary Disease

## 2013-09-07 ENCOUNTER — Ambulatory Visit (INDEPENDENT_AMBULATORY_CARE_PROVIDER_SITE_OTHER): Payer: Medicare Other | Admitting: Pulmonary Disease

## 2013-09-07 VITALS — BP 108/70 | HR 57 | Temp 97.1°F | Ht 63.0 in | Wt 216.4 lb

## 2013-09-07 DIAGNOSIS — J90 Pleural effusion, not elsewhere classified: Secondary | ICD-10-CM | POA: Diagnosis not present

## 2013-09-07 DIAGNOSIS — J4489 Other specified chronic obstructive pulmonary disease: Secondary | ICD-10-CM

## 2013-09-07 DIAGNOSIS — J449 Chronic obstructive pulmonary disease, unspecified: Secondary | ICD-10-CM

## 2013-09-07 MED ORDER — TRAZODONE HCL 100 MG PO TABS: 100.0000 mg | ORAL_TABLET | Freq: Every day | ORAL | Status: DC

## 2013-09-07 MED ORDER — ALBUTEROL SULFATE HFA 108 (90 BASE) MCG/ACT IN AERS: 2.0000 | INHALATION_SPRAY | Freq: Four times a day (QID) | RESPIRATORY_TRACT | Status: DC | PRN

## 2013-09-07 NOTE — Progress Notes (Signed)
  Subjective:    Patient ID: [name]., male    DOB: Nov 28, 1941, 72 y.o.   MRN: [id number]  HPI Patient comes in today for followup of his COPD.  He was in the hospital in July with new pulmonary infiltrates in his right middle and right upper lung zone.  It is unclear how much of this was pneumonia versus fluid, but the patient denied having any chest congestion, cough, or purulent mucus.  He has definitely improved since discharge, but is having difficulty getting his strength and endurance back.  His chest x-ray today shows clearing of his prior infiltrates, but persistent lower lobe interstitial changes which have been chronic.  He denies any cough, chest congestion, or mucus at this time.  It should be noted that he had a right and left heart catheterization during hospitalization which showed coronary disease that is being treated medically, as well is elevated left-sided pressures with a wedge of 22.   Review of Systems  Constitutional: Negative for fever and unexpected weight change.  HENT: Negative for ear pain, nosebleeds, congestion, sore throat, rhinorrhea, sneezing, trouble swallowing, dental problem, postnasal drip and sinus pressure.   Eyes: Negative for redness and itching.  Respiratory: Positive for shortness of breath. Negative for cough, chest tightness and wheezing.   Cardiovascular: Negative for palpitations and leg swelling.  Gastrointestinal: Negative for nausea and vomiting.  Genitourinary: Negative for dysuria.  Musculoskeletal: Negative for joint swelling.  Skin: Negative for rash.  Neurological: Negative for headaches.  Hematological: Does not bruise/bleed easily.  Psychiatric/Behavioral: Negative for dysphoric mood. The patient is not nervous/anxious.        Objective:   Physical Exam Obese male in no acute distress Nose without purulent discharge noted Neck without lymphadenopathy or thyromegaly Chest with decreased breath sounds, bibasilar crackles, no  wheezes Cardiac exam with distant heart sounds, but regular Lower extremities with 1-2+ edema, no cyanosis Alert and oriented, moves all 4 extremities.       Assessment & Plan:

## 2013-09-07 NOTE — Addendum Note (Signed)
Addended by: Maisie Fus on: 09/07/2013 03:15 PM   Modules accepted: Orders

## 2013-09-07 NOTE — Patient Instructions (Addendum)
No change in your breathing medications.  We will call in new prescription for ventolin Get back in rehab. followup with me in 3-4 mos to check on you, but call if not doing well.

## 2013-09-07 NOTE — Assessment & Plan Note (Signed)
The patient has not returned to his usual baseline since his hospitalization in July, but I told him this is not unexpected given his underlying cardiopulmonary issues.  He is on a good bronchodilator regimen, and I have asked him to continue this.  He is going to get back in rehabilitation, and I have encouraged him to continue.  I have also asked him to work aggressively on weight loss.  He is to let us know if he is not improving.

## 2013-09-08 ENCOUNTER — Encounter: Payer: Self-pay | Admitting: Endocrinology

## 2013-09-08 ENCOUNTER — Ambulatory Visit (INDEPENDENT_AMBULATORY_CARE_PROVIDER_SITE_OTHER): Payer: Medicare Other | Admitting: Endocrinology

## 2013-09-08 VITALS — BP 132/80 | HR 69 | Ht 63.0 in | Wt 213.0 lb

## 2013-09-08 DIAGNOSIS — E119 Type 2 diabetes mellitus without complications: Secondary | ICD-10-CM

## 2013-09-08 DIAGNOSIS — E1029 Type 1 diabetes mellitus with other diabetic kidney complication: Secondary | ICD-10-CM | POA: Diagnosis not present

## 2013-09-08 LAB — HEMOGLOBIN A1C: Hgb A1c MFr Bld: 7.3 % — ABNORMAL HIGH (ref 4.6–6.5)

## 2013-09-08 NOTE — Patient Instructions (Addendum)
check your blood sugar 2 times a day.  vary the time of day when you check, between before the 3 meals, and at bedtime.  also check if you have symptoms of your blood sugar being too high or too low.  please keep a record of the readings and bring it to your next appointment here.  please call us sooner if you are having low blood sugar episodes, or if it stays over 200.   Please resume the novolog, 3 times a day (just before each meal), 09-28-13 units.      Please come back for a regular physical appointment in 4 months.
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
## 2013-09-10 ENCOUNTER — Encounter (INDEPENDENT_AMBULATORY_CARE_PROVIDER_SITE_OTHER): Payer: Self-pay

## 2013-09-10 ENCOUNTER — Ambulatory Visit (INDEPENDENT_AMBULATORY_CARE_PROVIDER_SITE_OTHER): Payer: Medicare Other | Admitting: Physician Assistant

## 2013-09-10 VITALS — BP 118/64 | HR 70 | Resp 16 | Ht 63.0 in | Wt 215.4 lb

## 2013-09-10 DIAGNOSIS — Z4651 Encounter for fitting and adjustment of gastric lap band: Secondary | ICD-10-CM | POA: Diagnosis not present

## 2013-09-10 NOTE — Progress Notes (Signed)
  HISTORY: [name]. is a 72 y.o.male who received an AP-Large lap-band in June 2012 by Dr. Daphine Deutscher. He comes in with 3 lbs weight gain since replacement of 3 mL one month ago. He continues to have hunger and larger portions. 6 mL was removed in late July due to pulmonary symptoms. He is resuming pulmonary rehab in October so his exercise should be increasing.  VITAL SIGNS: Filed Vitals:   09/10/13 1130  BP: 118/64  Pulse: 70  Resp: 16    PHYSICAL EXAM: Physical exam reveals a very well-appearing 72 y.o.male in no apparent distress Neurologic: Awake, alert, oriented Psych: Bright affect, conversant Respiratory: Breathing even and unlabored. No stridor or wheezing Abdomen: Soft, nontender, nondistended to palpation. Incisions well-healed. No incisional hernias. Port easily palpated. Extremities: Atraumatic, good range of motion.  ASSESMENT: 72 y.o.  male  s/p AP-Large lap-band.   PLAN: The patient's port was accessed with a 20G Huber needle without difficulty. Clear fluid was aspirated and 1.5 mL saline was added to the port to give a total predicted volume of 7.75 mL. The patient was able to swallow water without difficulty following the procedure and was instructed to take clear liquids for the next 24-48 hours and advance slowly as tolerated.

## 2013-09-10 NOTE — Patient Instructions (Signed)

## 2013-09-24 ENCOUNTER — Other Ambulatory Visit: Payer: Self-pay | Admitting: Endocrinology

## 2013-09-25 ENCOUNTER — Ambulatory Visit: Payer: Medicare Other | Admitting: Pulmonary Disease

## 2013-09-28 ENCOUNTER — Other Ambulatory Visit: Payer: Self-pay | Admitting: Endocrinology

## 2013-09-28 ENCOUNTER — Other Ambulatory Visit: Payer: Self-pay | Admitting: Cardiovascular Disease

## 2013-09-30 ENCOUNTER — Ambulatory Visit: Payer: Medicare Other | Admitting: Pulmonary Disease

## 2013-10-01 ENCOUNTER — Encounter (INDEPENDENT_AMBULATORY_CARE_PROVIDER_SITE_OTHER): Payer: Medicare Other

## 2013-10-12 ENCOUNTER — Ambulatory Visit (INDEPENDENT_AMBULATORY_CARE_PROVIDER_SITE_OTHER): Payer: Medicare Other | Admitting: Endocrinology

## 2013-10-12 ENCOUNTER — Encounter: Payer: Self-pay | Admitting: Endocrinology

## 2013-10-12 VITALS — BP 140/78 | HR 63 | Temp 97.8°F | Ht 63.0 in | Wt 214.0 lb

## 2013-10-12 DIAGNOSIS — E119 Type 2 diabetes mellitus without complications: Secondary | ICD-10-CM | POA: Diagnosis not present

## 2013-10-12 NOTE — Patient Instructions (Signed)
Please come back if the nodules on your right thigh significantly enlarge. check your blood sugar 2 times a day.  vary the time of day when you check, between before the 3 meals, and at bedtime.  also check if you have symptoms of your blood sugar being too high or too low.  please keep a record of the readings and bring it to your next appointment here.  please call us sooner if you are having low blood sugar episodes, or if it stays over 200.   Please continue the novolog, 3 times a day (just before each meal), 09-28-13 units.      Please come back for a regular physical appointment as scheduled.
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
## 2013-10-14 DIAGNOSIS — E119 Type 2 diabetes mellitus without complications: Secondary | ICD-10-CM | POA: Diagnosis not present

## 2013-10-14 LAB — HM DIABETES EYE EXAM

## 2013-10-15 ENCOUNTER — Ambulatory Visit (INDEPENDENT_AMBULATORY_CARE_PROVIDER_SITE_OTHER): Payer: Medicare Other | Admitting: Physician Assistant

## 2013-10-15 ENCOUNTER — Encounter (INDEPENDENT_AMBULATORY_CARE_PROVIDER_SITE_OTHER): Payer: Self-pay

## 2013-10-15 ENCOUNTER — Encounter (HOSPITAL_COMMUNITY)
Admission: RE | Admit: 2013-10-15 | Discharge: 2013-10-15 | Disposition: A | Discharge status: Home / Self Care | Payer: Medicare Other | Source: Ambulatory Visit | Attending: Pulmonary Disease | Admitting: Pulmonary Disease

## 2013-10-15 ENCOUNTER — Encounter (INDEPENDENT_AMBULATORY_CARE_PROVIDER_SITE_OTHER): Payer: Medicare Other

## 2013-10-15 ENCOUNTER — Other Ambulatory Visit: Payer: Self-pay | Admitting: Endocrinology

## 2013-10-15 DIAGNOSIS — Z4651 Encounter for fitting and adjustment of gastric lap band: Secondary | ICD-10-CM | POA: Diagnosis not present

## 2013-10-15 DIAGNOSIS — I10 Essential (primary) hypertension: Secondary | ICD-10-CM | POA: Insufficient documentation

## 2013-10-15 DIAGNOSIS — I739 Peripheral vascular disease, unspecified: Secondary | ICD-10-CM | POA: Insufficient documentation

## 2013-10-15 DIAGNOSIS — I129 Hypertensive chronic kidney disease with stage 1 through stage 4 chronic kidney disease, or unspecified chronic kidney disease: Secondary | ICD-10-CM | POA: Insufficient documentation

## 2013-10-15 DIAGNOSIS — I5032 Chronic diastolic (congestive) heart failure: Secondary | ICD-10-CM | POA: Insufficient documentation

## 2013-10-15 DIAGNOSIS — E119 Type 2 diabetes mellitus without complications: Secondary | ICD-10-CM | POA: Insufficient documentation

## 2013-10-15 DIAGNOSIS — Z5189 Encounter for other specified aftercare: Secondary | ICD-10-CM | POA: Insufficient documentation

## 2013-10-15 DIAGNOSIS — I2510 Atherosclerotic heart disease of native coronary artery without angina pectoris: Secondary | ICD-10-CM | POA: Insufficient documentation

## 2013-10-15 DIAGNOSIS — N183 Chronic kidney disease, stage 3 unspecified: Secondary | ICD-10-CM | POA: Insufficient documentation

## 2013-10-15 DIAGNOSIS — J4489 Other specified chronic obstructive pulmonary disease: Secondary | ICD-10-CM | POA: Insufficient documentation

## 2013-10-15 DIAGNOSIS — J449 Chronic obstructive pulmonary disease, unspecified: Secondary | ICD-10-CM | POA: Insufficient documentation

## 2013-10-15 DIAGNOSIS — E785 Hyperlipidemia, unspecified: Secondary | ICD-10-CM | POA: Insufficient documentation

## 2013-10-15 NOTE — Progress Notes (Signed)
  HISTORY: [name]. is a 72 y.o.male who received an AP-Large lap-band in June 2012 by Dr. Ezzard Standing. He comes in today with 3 lbs weight gain since his last visit. We are in the process of progressive fills after a band holiday. He reports continued hunger and larger portion sizes but no symptoms of obstruction. He'd like a fill today. He has resumed pulmonary rehab.  VITAL SIGNS: Filed Vitals:   10/15/13 1405  BP: 134/82  Pulse: 64  Temp: 98.1 F (36.7 C)  Resp: 16    PHYSICAL EXAM: Physical exam reveals a very well-appearing 72 y.o.male in no apparent distress Neurologic: Awake, alert, oriented Psych: Bright affect, conversant Respiratory: Breathing even and unlabored. No stridor or wheezing Abdomen: Soft, nontender, nondistended to palpation. Incisions well-healed. No incisional hernias. Port easily palpated. Extremities: Atraumatic, good range of motion.  ASSESMENT: 72 y.o.  male  s/p AP-Large lap-band.   PLAN: The patient's port was accessed with a 20G Huber needle without difficulty. Clear fluid was aspirated and 0.75 mL saline was added to the port to give a total predicted volume of 8.5 mL. The patient was able to swallow water without difficulty following the procedure and was instructed to take clear liquids for the next 24-48 hours and advance slowly as tolerated.

## 2013-10-15 NOTE — Progress Notes (Signed)
Cardiac Rehab Medication Review by a Pharmacist  Does the patient  feel that his/her medications are working for him/her?  yes  Has the patient been experiencing any side effects to the medications prescribed?  no  Does the patient measure his/her own blood pressure or blood glucose at home?  yes   Does the patient have any problems obtaining medications due to transportation or finances?   no  Understanding of regimen: excellent Understanding of indications: excellent Potential of compliance: excellent    Pharmacist comments: [name] demonstrates an excellent understanding of his regimen and the indications of the medications. He describes good adherence and does not mention any issues obtaining the medications.     [name] [name], PharmD Clinical Pharmacist-Resident Pager: [phone] Pharmacy: [phone] 10/15/2013 8:32 AM

## 2013-10-15 NOTE — Patient Instructions (Signed)

## 2013-10-19 ENCOUNTER — Encounter (HOSPITAL_COMMUNITY)
Admission: RE | Admit: 2013-10-19 | Discharge: 2013-10-19 | Disposition: A | Discharge status: Home / Self Care | Payer: Medicare Other | Source: Ambulatory Visit | Attending: Cardiovascular Disease | Admitting: Cardiovascular Disease

## 2013-10-19 ENCOUNTER — Other Ambulatory Visit: Payer: Self-pay | Admitting: Endocrinology

## 2013-10-19 DIAGNOSIS — I10 Essential (primary) hypertension: Secondary | ICD-10-CM | POA: Diagnosis not present

## 2013-10-19 DIAGNOSIS — E119 Type 2 diabetes mellitus without complications: Secondary | ICD-10-CM | POA: Diagnosis not present

## 2013-10-19 DIAGNOSIS — I5032 Chronic diastolic (congestive) heart failure: Secondary | ICD-10-CM | POA: Diagnosis not present

## 2013-10-19 DIAGNOSIS — I739 Peripheral vascular disease, unspecified: Secondary | ICD-10-CM | POA: Diagnosis not present

## 2013-10-19 DIAGNOSIS — I2510 Atherosclerotic heart disease of native coronary artery without angina pectoris: Secondary | ICD-10-CM | POA: Diagnosis not present

## 2013-10-19 DIAGNOSIS — I129 Hypertensive chronic kidney disease with stage 1 through stage 4 chronic kidney disease, or unspecified chronic kidney disease: Secondary | ICD-10-CM | POA: Diagnosis not present

## 2013-10-19 DIAGNOSIS — E785 Hyperlipidemia, unspecified: Secondary | ICD-10-CM | POA: Diagnosis not present

## 2013-10-19 DIAGNOSIS — N183 Chronic kidney disease, stage 3 unspecified: Secondary | ICD-10-CM | POA: Diagnosis not present

## 2013-10-19 DIAGNOSIS — J449 Chronic obstructive pulmonary disease, unspecified: Secondary | ICD-10-CM | POA: Diagnosis not present

## 2013-10-19 DIAGNOSIS — Z5189 Encounter for other specified aftercare: Secondary | ICD-10-CM | POA: Diagnosis not present

## 2013-10-19 LAB — GLUCOSE, CAPILLARY
Glucose-Capillary: 163 mg/dL — ABNORMAL HIGH (ref 70–99)
Glucose-Capillary: 132 mg/dL — ABNORMAL HIGH (ref 70–99)

## 2013-10-19 NOTE — Progress Notes (Signed)
Pt in today for his first day of exercise at 11:15 exercise class time.  Pt tolerated light exercise with no complaints.  Monitor showed SR with rare PVC's. PHQ2 score - 0.  Continue to monitor.

## 2013-10-20 ENCOUNTER — Telehealth: Payer: Self-pay

## 2013-10-20 ENCOUNTER — Encounter: Payer: Self-pay | Admitting: Endocrinology

## 2013-10-20 MED ORDER — HYDROCODONE-ACETAMINOPHEN 10-325 MG PO TABS: 1.0000 | ORAL_TABLET | Freq: Four times a day (QID) | ORAL | Status: DC | PRN

## 2013-10-20 NOTE — Telephone Encounter (Signed)
Rx ready for pick up. Pt's wife is aware.

## 2013-10-20 NOTE — Telephone Encounter (Signed)
i printed 

## 2013-10-20 NOTE — Telephone Encounter (Signed)
Pt's spouse is calling requesting a refill of hydrocodone.  Pt last seen 10.20.14.  Pt is going out of town soon.

## 2013-10-21 ENCOUNTER — Encounter (HOSPITAL_COMMUNITY)
Admission: RE | Admit: 2013-10-21 | Discharge: 2013-10-21 | Disposition: A | Discharge status: Home / Self Care | Payer: Medicare Other | Source: Ambulatory Visit | Attending: Cardiovascular Disease | Admitting: Cardiovascular Disease

## 2013-10-21 LAB — GLUCOSE, CAPILLARY: Glucose-Capillary: 147 mg/dL — ABNORMAL HIGH (ref 70–99)

## 2013-10-23 ENCOUNTER — Encounter (HOSPITAL_COMMUNITY)
Admission: RE | Admit: 2013-10-23 | Discharge: 2013-10-23 | Disposition: A | Discharge status: Home / Self Care | Payer: Medicare Other | Source: Ambulatory Visit | Attending: Cardiovascular Disease | Admitting: Cardiovascular Disease

## 2013-10-26 ENCOUNTER — Telehealth (HOSPITAL_COMMUNITY): Payer: Self-pay | Admitting: Endocrinology

## 2013-10-26 ENCOUNTER — Encounter (HOSPITAL_COMMUNITY): Payer: Medicare Other

## 2013-10-27 ENCOUNTER — Other Ambulatory Visit: Payer: Self-pay

## 2013-10-27 MED ORDER — CLOPIDOGREL BISULFATE 75 MG PO TABS: 75.0000 mg | ORAL_TABLET | Freq: Every day | ORAL | Status: DC

## 2013-10-28 ENCOUNTER — Encounter (HOSPITAL_COMMUNITY): Payer: Medicare Other

## 2013-10-30 ENCOUNTER — Encounter (HOSPITAL_COMMUNITY): Payer: Medicare Other

## 2013-11-02 ENCOUNTER — Encounter (HOSPITAL_COMMUNITY): Payer: Medicare Other

## 2013-11-04 ENCOUNTER — Encounter (HOSPITAL_COMMUNITY): Payer: Medicare Other

## 2013-11-06 ENCOUNTER — Encounter (HOSPITAL_COMMUNITY): Payer: Medicare Other

## 2013-11-09 ENCOUNTER — Encounter (HOSPITAL_COMMUNITY)
Admission: RE | Admit: 2013-11-09 | Discharge: 2013-11-09 | Disposition: A | Discharge status: Home / Self Care | Payer: Medicare Other | Source: Ambulatory Visit | Attending: Pulmonary Disease | Admitting: Pulmonary Disease

## 2013-11-09 DIAGNOSIS — I5032 Chronic diastolic (congestive) heart failure: Secondary | ICD-10-CM | POA: Diagnosis not present

## 2013-11-09 DIAGNOSIS — E785 Hyperlipidemia, unspecified: Secondary | ICD-10-CM | POA: Insufficient documentation

## 2013-11-09 DIAGNOSIS — N183 Chronic kidney disease, stage 3 unspecified: Secondary | ICD-10-CM | POA: Diagnosis not present

## 2013-11-09 DIAGNOSIS — J4489 Other specified chronic obstructive pulmonary disease: Secondary | ICD-10-CM | POA: Insufficient documentation

## 2013-11-09 DIAGNOSIS — E119 Type 2 diabetes mellitus without complications: Secondary | ICD-10-CM | POA: Insufficient documentation

## 2013-11-09 DIAGNOSIS — I10 Essential (primary) hypertension: Secondary | ICD-10-CM | POA: Diagnosis not present

## 2013-11-09 DIAGNOSIS — I129 Hypertensive chronic kidney disease with stage 1 through stage 4 chronic kidney disease, or unspecified chronic kidney disease: Secondary | ICD-10-CM | POA: Insufficient documentation

## 2013-11-09 DIAGNOSIS — J449 Chronic obstructive pulmonary disease, unspecified: Secondary | ICD-10-CM | POA: Insufficient documentation

## 2013-11-09 DIAGNOSIS — I2510 Atherosclerotic heart disease of native coronary artery without angina pectoris: Secondary | ICD-10-CM | POA: Insufficient documentation

## 2013-11-09 DIAGNOSIS — Z5189 Encounter for other specified aftercare: Secondary | ICD-10-CM | POA: Insufficient documentation

## 2013-11-09 DIAGNOSIS — I739 Peripheral vascular disease, unspecified: Secondary | ICD-10-CM | POA: Insufficient documentation

## 2013-11-09 NOTE — Progress Notes (Signed)
Reviewed home exercise with pt today.  Pt plans to walk and continue to go to gym for exercise.  Reviewed THR, pulse, RPE, sign and symptoms, NTG use, and when to call 911 or MD.  Pt voiced understanding. Fabio Pierce, MA, ACSM RCEP

## 2013-11-11 ENCOUNTER — Ambulatory Visit (INDEPENDENT_AMBULATORY_CARE_PROVIDER_SITE_OTHER): Payer: Medicare Other | Admitting: Cardiovascular Disease

## 2013-11-11 ENCOUNTER — Encounter (HOSPITAL_COMMUNITY)
Admission: RE | Admit: 2013-11-11 | Discharge: 2013-11-11 | Disposition: A | Discharge status: Home / Self Care | Payer: Medicare Other | Source: Ambulatory Visit | Attending: Cardiovascular Disease | Admitting: Cardiovascular Disease

## 2013-11-11 ENCOUNTER — Encounter: Payer: Self-pay | Admitting: Cardiovascular Disease

## 2013-11-11 VITALS — BP 140/60 | HR 62 | Ht 63.0 in | Wt 220.0 lb

## 2013-11-11 DIAGNOSIS — I2510 Atherosclerotic heart disease of native coronary artery without angina pectoris: Secondary | ICD-10-CM

## 2013-11-11 DIAGNOSIS — E785 Hyperlipidemia, unspecified: Secondary | ICD-10-CM

## 2013-11-11 DIAGNOSIS — I255 Ischemic cardiomyopathy: Secondary | ICD-10-CM

## 2013-11-11 DIAGNOSIS — I10 Essential (primary) hypertension: Secondary | ICD-10-CM

## 2013-11-11 DIAGNOSIS — I2589 Other forms of chronic ischemic heart disease: Secondary | ICD-10-CM | POA: Diagnosis not present

## 2013-11-11 DIAGNOSIS — I5042 Chronic combined systolic (congestive) and diastolic (congestive) heart failure: Secondary | ICD-10-CM

## 2013-11-11 DIAGNOSIS — I509 Heart failure, unspecified: Secondary | ICD-10-CM

## 2013-11-11 NOTE — Patient Instructions (Signed)
Your physician wants you to follow-up in:  6 months. You will receive a reminder letter in the mail two months in advance. If you don't receive a letter, please call our office to schedule the follow-up appointment.   
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
## 2013-11-13 ENCOUNTER — Encounter (HOSPITAL_COMMUNITY)
Admission: RE | Admit: 2013-11-13 | Discharge: 2013-11-13 | Disposition: A | Discharge status: Home / Self Care | Payer: Medicare Other | Source: Ambulatory Visit | Attending: Cardiovascular Disease | Admitting: Cardiovascular Disease

## 2013-11-16 ENCOUNTER — Encounter (HOSPITAL_COMMUNITY)
Admission: RE | Admit: 2013-11-16 | Discharge: 2013-11-16 | Disposition: A | Discharge status: Home / Self Care | Payer: Medicare Other | Source: Ambulatory Visit | Attending: Cardiovascular Disease | Admitting: Cardiovascular Disease

## 2013-11-18 ENCOUNTER — Telehealth: Payer: Self-pay | Admitting: *Deleted

## 2013-11-18 ENCOUNTER — Other Ambulatory Visit: Payer: Self-pay | Admitting: *Deleted

## 2013-11-18 ENCOUNTER — Encounter (HOSPITAL_COMMUNITY): Payer: Medicare Other

## 2013-11-18 MED ORDER — HYDROCODONE-ACETAMINOPHEN 10-325 MG PO TABS: 1.0000 | ORAL_TABLET | Freq: Four times a day (QID) | ORAL | Status: DC | PRN

## 2013-11-18 NOTE — Telephone Encounter (Signed)
Ok to refill x 1  

## 2013-11-18 NOTE — Telephone Encounter (Signed)
Pt is requesting a refill of his Hydrocodone. He had 100 tablets filled on 10/28. Please advise

## 2013-11-21 ENCOUNTER — Other Ambulatory Visit: Payer: Self-pay | Admitting: Endocrinology

## 2013-11-23 ENCOUNTER — Ambulatory Visit: Payer: Medicare Other | Admitting: Endocrinology

## 2013-11-23 ENCOUNTER — Telehealth (HOSPITAL_COMMUNITY): Payer: Self-pay | Admitting: Endocrinology

## 2013-11-23 ENCOUNTER — Encounter (HOSPITAL_COMMUNITY): Payer: Medicare Other

## 2013-11-23 ENCOUNTER — Other Ambulatory Visit: Payer: Self-pay | Admitting: *Deleted

## 2013-11-23 ENCOUNTER — Ambulatory Visit (INDEPENDENT_AMBULATORY_CARE_PROVIDER_SITE_OTHER): Payer: Medicare Other | Admitting: Endocrinology

## 2013-11-23 VITALS — BP 120/60 | HR 62 | Temp 97.4°F | Ht 63.0 in | Wt 183.2 lb

## 2013-11-23 DIAGNOSIS — E119 Type 2 diabetes mellitus without complications: Secondary | ICD-10-CM | POA: Diagnosis not present

## 2013-11-23 MED ORDER — CEFUROXIME AXETIL 250 MG PO TABS: 250.0000 mg | ORAL_TABLET | Freq: Two times a day (BID) | ORAL | Status: DC

## 2013-11-23 MED ORDER — CITALOPRAM HYDROBROMIDE 20 MG PO TABS: 20.0000 mg | ORAL_TABLET | Freq: Every day | ORAL | Status: DC

## 2013-11-23 MED ORDER — PROMETHAZINE-CODEINE 6.25-10 MG/5ML PO SYRP: 5.0000 mL | ORAL_SOLUTION | ORAL | Status: DC | PRN

## 2013-11-23 NOTE — Patient Instructions (Addendum)
check your blood sugar 2 times a day.  vary the time of day when you check, between before the 3 meals, and at bedtime.  also check if you have symptoms of your blood sugar being too high or too low.  please keep a record of the readings and bring it to your next appointment here.  please call us sooner if you are having low blood sugar episodes, or if it stays over 200.   Please continue the novolog, 3 times a day (just before each meal), 09-28-13 units.      Please come back for a regular physical appointment as scheduled.  i have sent a prescription to your pharmacy, for an antibiotic pill. Here is a prescription for cough syrup. Loratadine-d (non-prescription) will help your congestion.
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
## 2013-11-25 ENCOUNTER — Encounter (HOSPITAL_COMMUNITY): Payer: Medicare Other

## 2013-11-26 ENCOUNTER — Encounter (INDEPENDENT_AMBULATORY_CARE_PROVIDER_SITE_OTHER): Payer: Self-pay

## 2013-11-26 ENCOUNTER — Ambulatory Visit (INDEPENDENT_AMBULATORY_CARE_PROVIDER_SITE_OTHER): Payer: Medicare Other | Admitting: Physician Assistant

## 2013-11-26 DIAGNOSIS — Z9884 Bariatric surgery status: Secondary | ICD-10-CM

## 2013-11-26 NOTE — Progress Notes (Signed)
  HISTORY: [name]. is a 72 y.o.male who received an AP-Large lap-band in June 2012 by Dr. Ezzard Standing. He comes in with 2 lbs weight gain. He reports being on antibiotics and cough medicine for a URI diagnosed earlier this week. He reports having been down to 211 lbs before being put on the medication and has noticed increasing ankle girth from edema. He has no hunger or increased portions. He also denies obstructive symtpoms.  VITAL SIGNS: Filed Vitals:   11/26/13 1350  BP: 132/76  Pulse: 60  Temp: 97.6 F (36.4 C)  Resp: 18    PHYSICAL EXAM: Physical exam reveals a very well-appearing 72 y.o.male in no apparent distress Neurologic: Awake, alert, oriented Psych: Bright affect, conversant Respiratory: Breathing even and unlabored. No stridor or wheezing Extremities: Atraumatic, good range of motion. Skin: Warm, Dry, no rashes Musculoskeletal: Normal gait, Joints normal  ASSESMENT: 72 y.o.  male  s/p AP-Large lap-band.   PLAN: It sounds like his weight gain is recent and likely due to edema. This in combination with his URI makes me hesitant to adjust his band as the weight may be transient. We'll have him return next month for re-evaluation once his URI clears up and hopefully his edema as well.

## 2013-11-26 NOTE — Patient Instructions (Signed)
Return in January. Focus on good food choices as well as physical activity. Return sooner if you have an increase in hunger, portion sizes or weight. Return also for difficulty swallowing, night cough, reflux.

## 2013-11-27 ENCOUNTER — Encounter (HOSPITAL_COMMUNITY): Payer: Medicare Other

## 2013-11-30 ENCOUNTER — Encounter (HOSPITAL_COMMUNITY): Payer: Medicare Other

## 2013-12-02 ENCOUNTER — Encounter (HOSPITAL_COMMUNITY): Payer: Medicare Other

## 2013-12-04 ENCOUNTER — Encounter (HOSPITAL_COMMUNITY): Payer: Medicare Other

## 2013-12-07 ENCOUNTER — Encounter (HOSPITAL_COMMUNITY): Admission: RE | Admit: 2013-12-07 | Payer: Medicare Other | Source: Ambulatory Visit

## 2013-12-08 ENCOUNTER — Telehealth: Payer: Self-pay | Admitting: Pulmonary Disease

## 2013-12-08 NOTE — Telephone Encounter (Signed)
Megan from Dr. George Hugh office called to let us know she will be faxing pulm rehab form to our office. [name] feels that [facility] should sign the paperwork for pulm rehab since he is the pt's pulmonary dr.  Jonathon Bellows - will you sign form when we receive this?

## 2013-12-09 ENCOUNTER — Encounter (HOSPITAL_COMMUNITY): Payer: Medicare Other

## 2013-12-11 ENCOUNTER — Encounter (HOSPITAL_COMMUNITY): Payer: Medicare Other

## 2013-12-11 NOTE — Telephone Encounter (Signed)
If he is a pt I follow, yes.

## 2013-12-14 ENCOUNTER — Encounter (HOSPITAL_COMMUNITY)
Admission: RE | Admit: 2013-12-14 | Discharge: 2013-12-14 | Disposition: A | Discharge status: Home / Self Care | Payer: Medicare Other | Source: Ambulatory Visit | Attending: Pulmonary Disease | Admitting: Pulmonary Disease

## 2013-12-14 DIAGNOSIS — I2510 Atherosclerotic heart disease of native coronary artery without angina pectoris: Secondary | ICD-10-CM | POA: Diagnosis not present

## 2013-12-14 DIAGNOSIS — I129 Hypertensive chronic kidney disease with stage 1 through stage 4 chronic kidney disease, or unspecified chronic kidney disease: Secondary | ICD-10-CM | POA: Insufficient documentation

## 2013-12-14 DIAGNOSIS — I5032 Chronic diastolic (congestive) heart failure: Secondary | ICD-10-CM | POA: Insufficient documentation

## 2013-12-14 DIAGNOSIS — I739 Peripheral vascular disease, unspecified: Secondary | ICD-10-CM | POA: Diagnosis not present

## 2013-12-14 DIAGNOSIS — J4489 Other specified chronic obstructive pulmonary disease: Secondary | ICD-10-CM | POA: Insufficient documentation

## 2013-12-14 DIAGNOSIS — E785 Hyperlipidemia, unspecified: Secondary | ICD-10-CM | POA: Insufficient documentation

## 2013-12-14 DIAGNOSIS — Z5189 Encounter for other specified aftercare: Secondary | ICD-10-CM | POA: Insufficient documentation

## 2013-12-14 DIAGNOSIS — N183 Chronic kidney disease, stage 3 unspecified: Secondary | ICD-10-CM | POA: Insufficient documentation

## 2013-12-14 DIAGNOSIS — E119 Type 2 diabetes mellitus without complications: Secondary | ICD-10-CM | POA: Insufficient documentation

## 2013-12-14 DIAGNOSIS — I10 Essential (primary) hypertension: Secondary | ICD-10-CM | POA: Insufficient documentation

## 2013-12-14 DIAGNOSIS — J449 Chronic obstructive pulmonary disease, unspecified: Secondary | ICD-10-CM | POA: Insufficient documentation

## 2013-12-15 ENCOUNTER — Telehealth: Payer: Self-pay

## 2013-12-15 NOTE — Telephone Encounter (Signed)
Received a fax from [facility] for Pt Oxygen  therapy. I contacted Dr. Shelle Iron office on 12/08/2013 and sent him these orders to review because [name] does not see pt for these problems.

## 2013-12-16 ENCOUNTER — Encounter (HOSPITAL_COMMUNITY)
Admission: RE | Admit: 2013-12-16 | Discharge: 2013-12-16 | Disposition: A | Discharge status: Home / Self Care | Payer: Medicare Other | Source: Ambulatory Visit | Attending: Cardiovascular Disease | Admitting: Cardiovascular Disease

## 2013-12-16 DIAGNOSIS — E119 Type 2 diabetes mellitus without complications: Secondary | ICD-10-CM | POA: Diagnosis not present

## 2013-12-16 DIAGNOSIS — I5032 Chronic diastolic (congestive) heart failure: Secondary | ICD-10-CM | POA: Diagnosis not present

## 2013-12-16 DIAGNOSIS — Z5189 Encounter for other specified aftercare: Secondary | ICD-10-CM | POA: Diagnosis not present

## 2013-12-16 DIAGNOSIS — J449 Chronic obstructive pulmonary disease, unspecified: Secondary | ICD-10-CM | POA: Diagnosis not present

## 2013-12-16 DIAGNOSIS — I129 Hypertensive chronic kidney disease with stage 1 through stage 4 chronic kidney disease, or unspecified chronic kidney disease: Secondary | ICD-10-CM | POA: Diagnosis not present

## 2013-12-18 ENCOUNTER — Encounter (HOSPITAL_COMMUNITY): Payer: Medicare Other

## 2013-12-18 ENCOUNTER — Telehealth: Payer: Self-pay

## 2013-12-18 MED ORDER — HYDROCODONE-ACETAMINOPHEN 10-325 MG PO TABS: 1.0000 | ORAL_TABLET | Freq: Four times a day (QID) | ORAL | Status: DC | PRN

## 2013-12-18 NOTE — Telephone Encounter (Signed)
Pt informed that script is ready for pick up

## 2013-12-18 NOTE — Telephone Encounter (Signed)
Ok to refill 

## 2013-12-18 NOTE — Telephone Encounter (Signed)
Pt called requesting refill for hydrocodone. Pt was last see on 11/23/2013 and medication was last filled on 11/18/2013.   Please advise during [name] absence,  Thanks!

## 2013-12-21 ENCOUNTER — Encounter (HOSPITAL_COMMUNITY)
Admission: RE | Admit: 2013-12-21 | Discharge: 2013-12-21 | Disposition: A | Discharge status: Home / Self Care | Payer: Medicare Other | Source: Ambulatory Visit | Attending: Cardiovascular Disease | Admitting: Cardiovascular Disease

## 2013-12-21 DIAGNOSIS — J449 Chronic obstructive pulmonary disease, unspecified: Secondary | ICD-10-CM | POA: Diagnosis not present

## 2013-12-21 DIAGNOSIS — I129 Hypertensive chronic kidney disease with stage 1 through stage 4 chronic kidney disease, or unspecified chronic kidney disease: Secondary | ICD-10-CM | POA: Diagnosis not present

## 2013-12-21 DIAGNOSIS — E119 Type 2 diabetes mellitus without complications: Secondary | ICD-10-CM | POA: Diagnosis not present

## 2013-12-21 DIAGNOSIS — Z5189 Encounter for other specified aftercare: Secondary | ICD-10-CM | POA: Diagnosis not present

## 2013-12-21 DIAGNOSIS — I5032 Chronic diastolic (congestive) heart failure: Secondary | ICD-10-CM | POA: Diagnosis not present

## 2013-12-21 NOTE — Progress Notes (Signed)
[name]. 72 y.o. male Nutrition Note Spoke with pt. Pt well-known to this Clinical research associate from previous admission. Nutrition Plan and Nutrition Survey goals reviewed with pt. Pt is following Step 2 of the Therapeutic Lifestyle Changes diet. Pt wants to lose wt. Pt has been trying to lose wt by "decreasing portion sizes." Pt reports he has not been able to be as active as he likes since his MI. Wt loss tips reviewed.  Pt is diabetic. Last A1c indicates blood glucose not optimally controlled. Per pt, A1c has been creeping upward since he has been less active. Pt checks CBG's QID. Fasting CBG's reportedly 190-215 mg/dL; CBG's before lunch 161-096 mg/dL and before dinner 045-409 mg/dL. Pt states his pre bedtime CBG "depends on what I ate for snack." This writer went over Diabetes Education test results. Pt expressed understanding of the information reviewed. Pt aware of nutrition education classes offered. Pt states he attended nutrition classes during previous admission to Cardiac Rehab. Pt plans on attending nutrition classes again with his wife.  Nutrition Diagnosis   Food-and nutrition-related knowledge deficit related to lack of exposure to information as related to diagnosis of: ? CVD ? DM (A1c 7.3)   Obesity related to excessive energy intake as evidenced by a BMI of 39.0  Nutrition RX/ Estimated Daily Nutrition Needs for: wt loss  1450-1950 Kcal, 40-50 gm fat, 9-13 gm sat fat, 1.4-2.0 gm trans-fat, <1500 mg sodium, 175-250 gm CHO   Nutrition Intervention   Pt's individual nutrition plan reviewed with pt.   Benefits of adopting Therapeutic Lifestyle Changes discussed when Medficts reviewed.   Pt to attend the Portion Distortion class   Pt to attend the  ? Nutrition I class                     ? Nutrition II class        ? Diabetes Blitz class       ? Diabetes Q & A class   Pt given handouts for: ? Nutrition class schedule   Continue client-centered nutrition education by RD, as part of  interdisciplinary care. Goal(s)   Pt to identify food quantities necessary to achieve: ? wt loss to a goal wt of 194-212 lb (88.1-96.3 kg) at graduation from cardiac rehab.    CBG concentrations in the normal range or as close to normal as is safely possible. Monitor and Evaluate progress toward nutrition goal with team. Nutrition Risk: Change to Moderate Mickle Plumb, M.Ed, RD, LDN, CDE 12/21/2013 11:59 AM

## 2013-12-23 ENCOUNTER — Encounter (HOSPITAL_COMMUNITY)
Admission: RE | Admit: 2013-12-23 | Discharge: 2013-12-23 | Disposition: A | Discharge status: Home / Self Care | Payer: Medicare Other | Source: Ambulatory Visit | Attending: Cardiovascular Disease | Admitting: Cardiovascular Disease

## 2013-12-23 DIAGNOSIS — E119 Type 2 diabetes mellitus without complications: Secondary | ICD-10-CM | POA: Diagnosis not present

## 2013-12-23 DIAGNOSIS — J449 Chronic obstructive pulmonary disease, unspecified: Secondary | ICD-10-CM | POA: Diagnosis not present

## 2013-12-23 DIAGNOSIS — I5032 Chronic diastolic (congestive) heart failure: Secondary | ICD-10-CM | POA: Diagnosis not present

## 2013-12-23 DIAGNOSIS — Z5189 Encounter for other specified aftercare: Secondary | ICD-10-CM | POA: Diagnosis not present

## 2013-12-23 DIAGNOSIS — I129 Hypertensive chronic kidney disease with stage 1 through stage 4 chronic kidney disease, or unspecified chronic kidney disease: Secondary | ICD-10-CM | POA: Diagnosis not present

## 2013-12-24 ENCOUNTER — Other Ambulatory Visit: Payer: Self-pay | Admitting: Endocrinology

## 2013-12-24 ENCOUNTER — Other Ambulatory Visit: Payer: Self-pay | Admitting: Cardiovascular Disease

## 2013-12-25 ENCOUNTER — Encounter (HOSPITAL_COMMUNITY)
Admission: RE | Admit: 2013-12-25 | Discharge: 2013-12-25 | Disposition: A | Discharge status: Home / Self Care | Payer: Medicare Other | Source: Ambulatory Visit | Attending: Pulmonary Disease | Admitting: Pulmonary Disease

## 2013-12-25 ENCOUNTER — Other Ambulatory Visit: Payer: Self-pay | Admitting: *Deleted

## 2013-12-25 DIAGNOSIS — N183 Chronic kidney disease, stage 3 unspecified: Secondary | ICD-10-CM | POA: Insufficient documentation

## 2013-12-25 DIAGNOSIS — E785 Hyperlipidemia, unspecified: Secondary | ICD-10-CM | POA: Insufficient documentation

## 2013-12-25 DIAGNOSIS — J4489 Other specified chronic obstructive pulmonary disease: Secondary | ICD-10-CM | POA: Insufficient documentation

## 2013-12-25 DIAGNOSIS — I2510 Atherosclerotic heart disease of native coronary artery without angina pectoris: Secondary | ICD-10-CM | POA: Diagnosis not present

## 2013-12-25 DIAGNOSIS — J449 Chronic obstructive pulmonary disease, unspecified: Secondary | ICD-10-CM | POA: Diagnosis not present

## 2013-12-25 DIAGNOSIS — I10 Essential (primary) hypertension: Secondary | ICD-10-CM | POA: Diagnosis not present

## 2013-12-25 DIAGNOSIS — I129 Hypertensive chronic kidney disease with stage 1 through stage 4 chronic kidney disease, or unspecified chronic kidney disease: Secondary | ICD-10-CM | POA: Insufficient documentation

## 2013-12-25 DIAGNOSIS — I739 Peripheral vascular disease, unspecified: Secondary | ICD-10-CM | POA: Diagnosis not present

## 2013-12-25 DIAGNOSIS — Z5189 Encounter for other specified aftercare: Secondary | ICD-10-CM | POA: Diagnosis not present

## 2013-12-25 DIAGNOSIS — E119 Type 2 diabetes mellitus without complications: Secondary | ICD-10-CM | POA: Diagnosis not present

## 2013-12-25 DIAGNOSIS — I5032 Chronic diastolic (congestive) heart failure: Secondary | ICD-10-CM | POA: Insufficient documentation

## 2013-12-25 MED ORDER — TRAZODONE HCL 100 MG PO TABS: 100.0000 mg | ORAL_TABLET | Freq: Every day | ORAL | Status: DC

## 2013-12-25 MED ORDER — CITALOPRAM HYDROBROMIDE 20 MG PO TABS: 20.0000 mg | ORAL_TABLET | Freq: Every day | ORAL | Status: DC

## 2013-12-28 ENCOUNTER — Encounter: Payer: Self-pay | Admitting: Pulmonary Disease

## 2013-12-28 ENCOUNTER — Other Ambulatory Visit: Payer: Self-pay | Admitting: *Deleted

## 2013-12-28 ENCOUNTER — Encounter (HOSPITAL_COMMUNITY)
Admission: RE | Admit: 2013-12-28 | Discharge: 2013-12-28 | Disposition: A | Discharge status: Home / Self Care | Payer: Medicare Other | Source: Ambulatory Visit | Attending: Cardiovascular Disease | Admitting: Cardiovascular Disease

## 2013-12-28 ENCOUNTER — Ambulatory Visit (INDEPENDENT_AMBULATORY_CARE_PROVIDER_SITE_OTHER): Payer: Medicare Other | Admitting: Pulmonary Disease

## 2013-12-28 VITALS — BP 122/62 | HR 63 | Temp 97.7°F | Ht 63.0 in | Wt 219.8 lb

## 2013-12-28 DIAGNOSIS — J449 Chronic obstructive pulmonary disease, unspecified: Secondary | ICD-10-CM | POA: Diagnosis not present

## 2013-12-28 NOTE — Patient Instructions (Signed)
No change in breathing meds Keep working in rehab, as well as weight reduction. followup with me again in 4-36mos.

## 2013-12-28 NOTE — Telephone Encounter (Signed)
Opened encounter in error  

## 2013-12-28 NOTE — Progress Notes (Signed)
   Subjective:    Patient ID: [name]., male    DOB: 1941-04-14, 73 y.o.   MRN: [id number]  HPI The patient comes in today for followup of his known COPD. His breathing is also complicated by an ischemic cardiomyopathy with tendency to congestive heart failure, as well as obesity with deconditioning. Overall, the patient has done fairly well since the last visit, but did have one episode recently where he had to overuse his rescue inhaler for a few days before things improved.  He currently denies any chest congestion or purulence. He has been staying on his medication regimen compliantly.   Review of Systems  Constitutional: Negative for fever and unexpected weight change.  HENT: Negative for congestion, dental problem, ear pain, nosebleeds, postnasal drip, rhinorrhea, sinus pressure, sneezing, sore throat and trouble swallowing.   Eyes: Negative for redness and itching.  Respiratory: Negative for cough, chest tightness, shortness of breath and wheezing.   Cardiovascular: Negative for palpitations and leg swelling.  Gastrointestinal: Negative for nausea and vomiting.  Genitourinary: Negative for dysuria.  Musculoskeletal: Negative for joint swelling.  Skin: Negative for rash.  Neurological: Negative for headaches.  Hematological: Does not bruise/bleed easily.  Psychiatric/Behavioral: Negative for dysphoric mood. The patient is not nervous/anxious.        Objective:   Physical Exam Obese male in no acute distress Nose without purulence or discharge noted Neck without lymphadenopathy or thyromegaly Chest with clear breath sounds, as well as excellent air flow. Cardiac exam with regular rate and rhythm Lower extremities with 2+ edema, no cyanosis Alert and oriented, moves all 4 extremities.       Assessment & Plan:

## 2013-12-28 NOTE — Assessment & Plan Note (Signed)
The patient appears to be at a stable baseline from a pulmonary standpoint. I have asked him to continue on his current bronchodilator regimen, and to continue in his rehabilitation program. I've also encouraged him to work aggressively on weight loss.

## 2013-12-30 ENCOUNTER — Encounter (HOSPITAL_COMMUNITY)
Admission: RE | Admit: 2013-12-30 | Discharge: 2013-12-30 | Disposition: A | Discharge status: Home / Self Care | Payer: Medicare Other | Source: Ambulatory Visit | Attending: Cardiovascular Disease | Admitting: Cardiovascular Disease

## 2014-01-01 ENCOUNTER — Encounter (HOSPITAL_COMMUNITY)
Admission: RE | Admit: 2014-01-01 | Discharge: 2014-01-01 | Disposition: A | Discharge status: Home / Self Care | Payer: Medicare Other | Source: Ambulatory Visit | Attending: Cardiovascular Disease | Admitting: Cardiovascular Disease

## 2014-01-04 ENCOUNTER — Encounter (HOSPITAL_COMMUNITY)
Admission: RE | Admit: 2014-01-04 | Discharge: 2014-01-04 | Disposition: A | Discharge status: Home / Self Care | Payer: Medicare Other | Source: Ambulatory Visit | Attending: Cardiovascular Disease | Admitting: Cardiovascular Disease

## 2014-01-06 ENCOUNTER — Encounter (HOSPITAL_COMMUNITY)
Admission: RE | Admit: 2014-01-06 | Discharge: 2014-01-06 | Disposition: A | Discharge status: Home / Self Care | Payer: Medicare Other | Source: Ambulatory Visit | Attending: Cardiovascular Disease | Admitting: Cardiovascular Disease

## 2014-01-07 ENCOUNTER — Encounter (INDEPENDENT_AMBULATORY_CARE_PROVIDER_SITE_OTHER): Payer: Medicare Other

## 2014-01-08 ENCOUNTER — Ambulatory Visit: Payer: Medicare Other | Admitting: Endocrinology

## 2014-01-08 ENCOUNTER — Encounter (HOSPITAL_COMMUNITY)
Admission: RE | Admit: 2014-01-08 | Discharge: 2014-01-08 | Disposition: A | Discharge status: Home / Self Care | Payer: Medicare Other | Source: Ambulatory Visit | Attending: Cardiovascular Disease | Admitting: Cardiovascular Disease

## 2014-01-11 ENCOUNTER — Encounter (HOSPITAL_COMMUNITY)
Admission: RE | Admit: 2014-01-11 | Discharge: 2014-01-11 | Disposition: A | Discharge status: Home / Self Care | Payer: Medicare Other | Source: Ambulatory Visit | Attending: Cardiovascular Disease | Admitting: Cardiovascular Disease

## 2014-01-12 ENCOUNTER — Other Ambulatory Visit: Payer: Self-pay | Admitting: *Deleted

## 2014-01-12 ENCOUNTER — Other Ambulatory Visit: Payer: Self-pay | Admitting: Endocrinology

## 2014-01-12 MED ORDER — DILTIAZEM HCL ER BEADS 180 MG PO CP24: 180.0000 mg | ORAL_CAPSULE | Freq: Every day | ORAL | Status: DC

## 2014-01-13 ENCOUNTER — Encounter (HOSPITAL_COMMUNITY)
Admission: RE | Admit: 2014-01-13 | Discharge: 2014-01-13 | Disposition: A | Discharge status: Home / Self Care | Payer: Medicare Other | Source: Ambulatory Visit | Attending: Cardiovascular Disease | Admitting: Cardiovascular Disease

## 2014-01-14 ENCOUNTER — Other Ambulatory Visit: Payer: Self-pay | Admitting: *Deleted

## 2014-01-14 ENCOUNTER — Other Ambulatory Visit: Payer: Self-pay | Admitting: Endocrinology

## 2014-01-14 MED ORDER — CALCITRIOL 0.25 MCG PO CAPS: 0.2500 ug | ORAL_CAPSULE | Freq: Every day | ORAL | Status: DC

## 2014-01-15 ENCOUNTER — Telehealth: Payer: Self-pay

## 2014-01-15 ENCOUNTER — Telehealth (HOSPITAL_COMMUNITY): Payer: Self-pay | Admitting: Endocrinology

## 2014-01-15 ENCOUNTER — Encounter (HOSPITAL_COMMUNITY): Payer: Medicare Other

## 2014-01-15 MED ORDER — HYDROCODONE-ACETAMINOPHEN 10-325 MG PO TABS: 1.0000 | ORAL_TABLET | Freq: Four times a day (QID) | ORAL | Status: DC | PRN

## 2014-01-15 NOTE — Telephone Encounter (Signed)
Pt called requesting a refill on hydrocodone. Medication was last filled on 12/26/ and pt was last seen on 11/23/2013.  Thanks!

## 2014-01-15 NOTE — Telephone Encounter (Signed)
i printed 

## 2014-01-18 ENCOUNTER — Encounter (HOSPITAL_COMMUNITY)
Admission: RE | Admit: 2014-01-18 | Discharge: 2014-01-18 | Disposition: A | Discharge status: Home / Self Care | Payer: Medicare Other | Source: Ambulatory Visit | Attending: Cardiovascular Disease | Admitting: Cardiovascular Disease

## 2014-01-18 ENCOUNTER — Other Ambulatory Visit: Payer: Self-pay | Admitting: Endocrinology

## 2014-01-18 NOTE — Telephone Encounter (Signed)
Pt informed and script placed upfront.

## 2014-01-19 ENCOUNTER — Telehealth: Payer: Self-pay | Admitting: *Deleted

## 2014-01-19 MED ORDER — DILTIAZEM HCL ER COATED BEADS 180 MG PO CP24: 180.0000 mg | ORAL_CAPSULE | Freq: Every day | ORAL | Status: DC

## 2014-01-19 NOTE — Telephone Encounter (Signed)
Received message below from refill team. I reviewed with Elberta Leatherwood, PharmD and Cardizem CD 180 mg daily or equivalent can be substituted. I spoke with pt and told him I would send to his pharmacy--[facility] on [address].  I told him I would call him back if pharmacy did not have. I then spoke with [facility] and confirmed they have this medication and will fill for pt   -------------------------------  Patient states he has called several pharmacies to refill his taztia xt 180 mg and they are out, he has 2 tablets left, he is going to beach on Friday and is wondering is there an alternative he can use, please call cell phone number [phone].   Thanks, Lovett Sox, RN

## 2014-01-20 ENCOUNTER — Encounter (HOSPITAL_COMMUNITY): Payer: Medicare Other

## 2014-01-22 ENCOUNTER — Encounter (HOSPITAL_COMMUNITY): Payer: Medicare Other

## 2014-01-25 ENCOUNTER — Encounter (HOSPITAL_COMMUNITY): Payer: Medicare Other

## 2014-01-27 ENCOUNTER — Encounter (HOSPITAL_COMMUNITY): Payer: Medicare Other

## 2014-01-29 ENCOUNTER — Encounter (HOSPITAL_COMMUNITY): Payer: Medicare Other

## 2014-01-31 ENCOUNTER — Other Ambulatory Visit: Payer: Self-pay | Admitting: Endocrinology

## 2014-02-01 ENCOUNTER — Encounter (HOSPITAL_COMMUNITY)
Admission: RE | Admit: 2014-02-01 | Discharge: 2014-02-01 | Disposition: A | Discharge status: Home / Self Care | Payer: Medicare Other | Source: Ambulatory Visit | Attending: Pulmonary Disease | Admitting: Pulmonary Disease

## 2014-02-01 DIAGNOSIS — N183 Chronic kidney disease, stage 3 unspecified: Secondary | ICD-10-CM | POA: Insufficient documentation

## 2014-02-01 DIAGNOSIS — E119 Type 2 diabetes mellitus without complications: Secondary | ICD-10-CM | POA: Diagnosis not present

## 2014-02-01 DIAGNOSIS — J4489 Other specified chronic obstructive pulmonary disease: Secondary | ICD-10-CM | POA: Insufficient documentation

## 2014-02-01 DIAGNOSIS — I5032 Chronic diastolic (congestive) heart failure: Secondary | ICD-10-CM | POA: Insufficient documentation

## 2014-02-01 DIAGNOSIS — E785 Hyperlipidemia, unspecified: Secondary | ICD-10-CM | POA: Diagnosis not present

## 2014-02-01 DIAGNOSIS — J449 Chronic obstructive pulmonary disease, unspecified: Secondary | ICD-10-CM | POA: Diagnosis not present

## 2014-02-01 DIAGNOSIS — I10 Essential (primary) hypertension: Secondary | ICD-10-CM | POA: Insufficient documentation

## 2014-02-01 DIAGNOSIS — I2510 Atherosclerotic heart disease of native coronary artery without angina pectoris: Secondary | ICD-10-CM | POA: Diagnosis not present

## 2014-02-01 DIAGNOSIS — I129 Hypertensive chronic kidney disease with stage 1 through stage 4 chronic kidney disease, or unspecified chronic kidney disease: Secondary | ICD-10-CM | POA: Insufficient documentation

## 2014-02-01 DIAGNOSIS — Z5189 Encounter for other specified aftercare: Secondary | ICD-10-CM | POA: Diagnosis not present

## 2014-02-01 DIAGNOSIS — I739 Peripheral vascular disease, unspecified: Secondary | ICD-10-CM | POA: Insufficient documentation

## 2014-02-02 DIAGNOSIS — D239 Other benign neoplasm of skin, unspecified: Secondary | ICD-10-CM | POA: Diagnosis not present

## 2014-02-02 DIAGNOSIS — L219 Seborrheic dermatitis, unspecified: Secondary | ICD-10-CM | POA: Diagnosis not present

## 2014-02-02 DIAGNOSIS — Z85828 Personal history of other malignant neoplasm of skin: Secondary | ICD-10-CM | POA: Diagnosis not present

## 2014-02-03 ENCOUNTER — Encounter (HOSPITAL_COMMUNITY)
Admission: RE | Admit: 2014-02-03 | Discharge: 2014-02-03 | Disposition: A | Discharge status: Home / Self Care | Payer: Medicare Other | Source: Ambulatory Visit | Attending: Pulmonary Disease | Admitting: Pulmonary Disease

## 2014-02-05 ENCOUNTER — Encounter (HOSPITAL_COMMUNITY)
Admission: RE | Admit: 2014-02-05 | Discharge: 2014-02-05 | Disposition: A | Discharge status: Home / Self Care | Payer: Medicare Other | Source: Ambulatory Visit | Attending: Pulmonary Disease | Admitting: Pulmonary Disease

## 2014-02-08 ENCOUNTER — Ambulatory Visit (INDEPENDENT_AMBULATORY_CARE_PROVIDER_SITE_OTHER): Payer: Medicare Other | Admitting: Endocrinology

## 2014-02-08 ENCOUNTER — Encounter (HOSPITAL_COMMUNITY)
Admission: RE | Admit: 2014-02-08 | Discharge: 2014-02-08 | Disposition: A | Discharge status: Home / Self Care | Payer: Medicare Other | Source: Ambulatory Visit | Attending: Pulmonary Disease | Admitting: Pulmonary Disease

## 2014-02-08 ENCOUNTER — Encounter: Payer: Self-pay | Admitting: Endocrinology

## 2014-02-08 VITALS — BP 118/60 | HR 68 | Temp 98.0°F | Ht 63.0 in | Wt 224.0 lb

## 2014-02-08 DIAGNOSIS — N259 Disorder resulting from impaired renal tubular function, unspecified: Secondary | ICD-10-CM

## 2014-02-08 DIAGNOSIS — R0602 Shortness of breath: Secondary | ICD-10-CM

## 2014-02-08 DIAGNOSIS — M109 Gout, unspecified: Secondary | ICD-10-CM

## 2014-02-08 DIAGNOSIS — N2581 Secondary hyperparathyroidism of renal origin: Secondary | ICD-10-CM | POA: Diagnosis not present

## 2014-02-08 DIAGNOSIS — E119 Type 2 diabetes mellitus without complications: Secondary | ICD-10-CM

## 2014-02-08 DIAGNOSIS — D509 Iron deficiency anemia, unspecified: Secondary | ICD-10-CM

## 2014-02-08 DIAGNOSIS — E1029 Type 1 diabetes mellitus with other diabetic kidney complication: Secondary | ICD-10-CM | POA: Diagnosis not present

## 2014-02-08 DIAGNOSIS — Z125 Encounter for screening for malignant neoplasm of prostate: Secondary | ICD-10-CM

## 2014-02-08 DIAGNOSIS — E785 Hyperlipidemia, unspecified: Secondary | ICD-10-CM

## 2014-02-08 DIAGNOSIS — Z79899 Other long term (current) drug therapy: Secondary | ICD-10-CM

## 2014-02-08 MED ORDER — HYDROCODONE-ACETAMINOPHEN 10-325 MG PO TABS: 1.0000 | ORAL_TABLET | Freq: Four times a day (QID) | ORAL | Status: DC | PRN
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
## 2014-02-08 NOTE — Patient Instructions (Addendum)
blood tests are being requested for you today.  We'll contact you with results. please consider these measures for your health:  minimize alcohol.  do not use tobacco products.  have a colonoscopy at least every 10 years from age 73.  keep firearms safely stored.  always use seat belts.  have working smoke alarms in your home.  see an eye doctor and dentist regularly.  never drive under the influence of alcohol or drugs (including prescription drugs).  those with fair skin should take precautions against the sun. Please come back for a follow-up appointment in 3 months.   good diet and exercise habits significanly improve the control of your diabetes.  please let me know if you wish to be referred to a dietician.  high blood sugar is very risky to your health.  you should see an eye doctor every year.  You are at higher than average risk for pneumonia and hepatitis-B.  You should be vaccinated against both.

## 2014-02-09 ENCOUNTER — Ambulatory Visit: Payer: Medicare Other | Admitting: Endocrinology

## 2014-02-10 ENCOUNTER — Encounter (HOSPITAL_COMMUNITY): Payer: Medicare Other

## 2014-02-10 ENCOUNTER — Other Ambulatory Visit: Payer: Self-pay

## 2014-02-10 ENCOUNTER — Encounter: Payer: Self-pay | Admitting: Physician Assistant

## 2014-02-10 ENCOUNTER — Ambulatory Visit (INDEPENDENT_AMBULATORY_CARE_PROVIDER_SITE_OTHER): Payer: Medicare Other | Admitting: Physician Assistant

## 2014-02-10 ENCOUNTER — Other Ambulatory Visit: Payer: Self-pay | Admitting: Endocrinology

## 2014-02-10 ENCOUNTER — Telehealth: Payer: Self-pay

## 2014-02-10 ENCOUNTER — Other Ambulatory Visit (INDEPENDENT_AMBULATORY_CARE_PROVIDER_SITE_OTHER): Payer: Medicare Other

## 2014-02-10 VITALS — BP 140/68 | HR 60 | Ht 63.0 in | Wt 226.0 lb

## 2014-02-10 DIAGNOSIS — I2510 Atherosclerotic heart disease of native coronary artery without angina pectoris: Secondary | ICD-10-CM

## 2014-02-10 DIAGNOSIS — Z125 Encounter for screening for malignant neoplasm of prostate: Secondary | ICD-10-CM

## 2014-02-10 DIAGNOSIS — E785 Hyperlipidemia, unspecified: Secondary | ICD-10-CM

## 2014-02-10 DIAGNOSIS — R0989 Other specified symptoms and signs involving the circulatory and respiratory systems: Secondary | ICD-10-CM | POA: Diagnosis not present

## 2014-02-10 DIAGNOSIS — N259 Disorder resulting from impaired renal tubular function, unspecified: Secondary | ICD-10-CM

## 2014-02-10 DIAGNOSIS — E119 Type 2 diabetes mellitus without complications: Secondary | ICD-10-CM

## 2014-02-10 DIAGNOSIS — R0602 Shortness of breath: Secondary | ICD-10-CM

## 2014-02-10 DIAGNOSIS — N2581 Secondary hyperparathyroidism of renal origin: Secondary | ICD-10-CM | POA: Diagnosis not present

## 2014-02-10 DIAGNOSIS — M109 Gout, unspecified: Secondary | ICD-10-CM

## 2014-02-10 DIAGNOSIS — D509 Iron deficiency anemia, unspecified: Secondary | ICD-10-CM | POA: Diagnosis not present

## 2014-02-10 DIAGNOSIS — I10 Essential (primary) hypertension: Secondary | ICD-10-CM

## 2014-02-10 DIAGNOSIS — R0609 Other forms of dyspnea: Secondary | ICD-10-CM

## 2014-02-10 DIAGNOSIS — Z79899 Other long term (current) drug therapy: Secondary | ICD-10-CM

## 2014-02-10 DIAGNOSIS — R0600 Dyspnea, unspecified: Secondary | ICD-10-CM

## 2014-02-10 DIAGNOSIS — I5041 Acute combined systolic (congestive) and diastolic (congestive) heart failure: Secondary | ICD-10-CM | POA: Diagnosis not present

## 2014-02-10 DIAGNOSIS — I5032 Chronic diastolic (congestive) heart failure: Secondary | ICD-10-CM | POA: Diagnosis not present

## 2014-02-10 LAB — IBC PANEL
Iron: 47 ug/dL (ref 42–165)
Saturation Ratios: 15.9 % — ABNORMAL LOW (ref 20.0–50.0)
TRANSFERRIN: 210.8 mg/dL — AB (ref 212.0–360.0)

## 2014-02-10 LAB — BASIC METABOLIC PANEL
BUN: 35 mg/dL — AB (ref 6–23)
CALCIUM: 9 mg/dL (ref 8.4–10.5)
CO2: 29 mEq/L (ref 19–32)
CREATININE: 1.4 mg/dL (ref 0.4–1.5)
Chloride: 97 mEq/L (ref 96–112)
GFR: 51.7 mL/min — ABNORMAL LOW (ref >60.00)
Glucose, Bld: 201 mg/dL — ABNORMAL HIGH (ref 70–99)
Potassium: 4.2 mEq/L (ref 3.5–5.1)
Sodium: 134 mEq/L — ABNORMAL LOW (ref 135–145)

## 2014-02-10 LAB — PSA, MEDICARE: PSA: 0.32 ng/ml (ref 0.10–4.00)

## 2014-02-10 LAB — CBC WITH DIFFERENTIAL/PLATELET
Basophils Absolute: 0 10*3/uL (ref 0.0–0.1)
Basophils Relative: 0.3 % (ref 0.0–3.0)
EOS ABS: 0.2 10*3/uL (ref 0.0–0.7)
Eosinophils Relative: 2.5 % (ref 0.0–5.0)
HCT: 39.2 % (ref 39.0–52.0)
Hemoglobin: 12.7 g/dL — ABNORMAL LOW (ref 13.0–17.0)
Lymphocytes Relative: 15.4 % (ref 12.0–46.0)
Lymphs Abs: 1.2 10*3/uL (ref 0.7–4.0)
MCHC: 32.5 g/dL (ref 30.0–36.0)
MCV: 90.6 fl (ref 78.0–100.0)
Monocytes Absolute: 0.5 10*3/uL (ref 0.1–1.0)
Monocytes Relative: 6.7 % (ref 3.0–12.0)
NEUTROS PCT: 75.1 % (ref 43.0–77.0)
Neutro Abs: 6 10*3/uL (ref 1.4–7.7)
PLATELETS: 292 10*3/uL (ref 150.0–400.0)
RBC: 4.33 Mil/uL (ref 4.22–5.81)
RDW: 15.8 % — ABNORMAL HIGH (ref 11.5–14.6)
WBC: 7.9 10*3/uL (ref 4.5–10.5)

## 2014-02-10 LAB — LIPID PANEL
Cholesterol: 136 mg/dL (ref 0–200)
HDL: 48.2 mg/dL (ref >39.00)
LDL Cholesterol: 67 mg/dL (ref 0–99)
Total CHOL/HDL Ratio: 3
Triglycerides: 102 mg/dL (ref 0.0–149.0)
VLDL: 20.4 mg/dL (ref 0.0–40.0)

## 2014-02-10 LAB — HEMOGLOBIN A1C: HEMOGLOBIN A1C: 7.1 % — AB (ref 4.6–6.5)

## 2014-02-10 LAB — MICROALBUMIN / CREATININE URINE RATIO
Creatinine,U: 46.6 mg/dL
MICROALB UR: 29.8 mg/dL — AB (ref 0.0–1.9)
MICROALB/CREAT RATIO: 64 mg/g — AB (ref 0.0–30.0)

## 2014-02-10 LAB — BRAIN NATRIURETIC PEPTIDE: PRO B NATRI PEPTIDE: 241 pg/mL — AB (ref 0.0–100.0)

## 2014-02-10 LAB — URIC ACID: URIC ACID, SERUM: 4.6 mg/dL (ref 4.0–7.8)

## 2014-02-10 LAB — TSH: TSH: 1.66 u[IU]/mL (ref 0.35–5.50)

## 2014-02-10 NOTE — Assessment & Plan Note (Signed)
Controlled.  

## 2014-02-10 NOTE — Telephone Encounter (Signed)
Done
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
## 2014-02-10 NOTE — Assessment & Plan Note (Signed)
Stable without chest pain. Currently working with cardiac rehabilitation.

## 2014-02-10 NOTE — Addendum Note (Signed)
Addended by: Moody Bruins E on: 02/10/2014 01:38 PM   Modules accepted: Orders

## 2014-02-10 NOTE — Patient Instructions (Signed)
Your physician recommends that you continue on your current medications as directed. Please refer to the Current Medication list given to you today.  Lab Today: Bmet, [place]  Your physician recommends that you schedule a follow-up appointment in: 1-2 months with Dr.Mcalhany

## 2014-02-10 NOTE — Assessment & Plan Note (Addendum)
Patient has had a gradual increase in weight gain. He's also had some abdominal swelling and tightness but it is not tight today. There is no significant evidence of heart failure on exam today. I will check a BNP and bmet. If his BNP is elevated I will advise him to take an extra 80 mg of Lasix for 3 days. Decrease diet Coke intake and sodium intake.

## 2014-02-10 NOTE — Assessment & Plan Note (Signed)
Check labs today on high dose Lasix.

## 2014-02-10 NOTE — Telephone Encounter (Signed)
Yes, please.

## 2014-02-10 NOTE — Telephone Encounter (Signed)
Labs that were drawn on 02/08/2014 were not picked up on Monday and are no good.  Would you like me to call to get pt to come back in and have labs done? Thanks!

## 2014-02-11 ENCOUNTER — Other Ambulatory Visit: Payer: Medicare Other

## 2014-02-11 ENCOUNTER — Ambulatory Visit (INDEPENDENT_AMBULATORY_CARE_PROVIDER_SITE_OTHER): Payer: Medicare Other | Admitting: Physician Assistant

## 2014-02-11 ENCOUNTER — Encounter (INDEPENDENT_AMBULATORY_CARE_PROVIDER_SITE_OTHER): Payer: Medicare Other

## 2014-02-11 ENCOUNTER — Encounter (INDEPENDENT_AMBULATORY_CARE_PROVIDER_SITE_OTHER): Payer: Self-pay

## 2014-02-11 VITALS — BP 118/72 | HR 68 | Temp 98.0°F | Resp 14 | Ht 63.0 in | Wt 223.6 lb

## 2014-02-11 DIAGNOSIS — Z4651 Encounter for fitting and adjustment of gastric lap band: Secondary | ICD-10-CM

## 2014-02-11 LAB — PTH, INTACT AND CALCIUM
Calcium: 9.2 mg/dL (ref 8.4–10.5)
PTH: 112.4 pg/mL — AB (ref 14.0–72.0)

## 2014-02-11 NOTE — Progress Notes (Signed)
  HISTORY: [name] Proby. is a 73 y.o.male who received an AP-Large lap-band in June 2012 by Dr. Lucia Gaskins. He comes in with 2.6 lbs weight gain. He says for the first time in a while he's had increased hunger. He has no regurgitation or reflux symptoms, however. His lasix dose may be increased as well as fluid retention may be a factor.  VITAL SIGNS: Filed Vitals:   02/11/14 1426  BP: 118/72  Pulse: 68  Temp: 98 F (36.7 C)  Resp: 14    PHYSICAL EXAM: Physical exam reveals a very well-appearing 72 y.o.male in no apparent distress Neurologic: Awake, alert, oriented Psych: Bright affect, conversant Respiratory: Breathing even and unlabored. No stridor or wheezing Abdomen: Soft, nontender, nondistended to palpation. Incisions well-healed. No incisional hernias. Port easily palpated. Extremities: Atraumatic, good range of motion.  ASSESMENT: 73 y.o.  male  s/p AP-Large lap-band.   PLAN: The patient's port was accessed with a 20G Huber needle without difficulty. Clear fluid was aspirated and 0.5 mL saline was added to the port to give a total predicted volume of 9 mL. The patient was able to swallow water without difficulty following the procedure and was instructed to take clear liquids for the next 24-48 hours and advance slowly as tolerated. I reviewed his labs that were drawn yesterday.

## 2014-02-11 NOTE — Patient Instructions (Signed)

## 2014-02-12 ENCOUNTER — Encounter (HOSPITAL_COMMUNITY): Payer: Medicare Other

## 2014-02-15 ENCOUNTER — Encounter (HOSPITAL_COMMUNITY)
Admission: RE | Admit: 2014-02-15 | Discharge: 2014-02-15 | Disposition: A | Discharge status: Home / Self Care | Payer: Medicare Other | Source: Ambulatory Visit | Attending: Pulmonary Disease | Admitting: Pulmonary Disease

## 2014-02-17 ENCOUNTER — Encounter (HOSPITAL_COMMUNITY)
Admission: RE | Admit: 2014-02-17 | Discharge: 2014-02-17 | Disposition: A | Discharge status: Home / Self Care | Payer: Medicare Other | Source: Ambulatory Visit | Attending: Pulmonary Disease | Admitting: Pulmonary Disease

## 2014-02-19 ENCOUNTER — Encounter (HOSPITAL_COMMUNITY)
Admission: RE | Admit: 2014-02-19 | Discharge: 2014-02-19 | Disposition: A | Discharge status: Home / Self Care | Payer: Medicare Other | Source: Ambulatory Visit | Attending: Pulmonary Disease | Admitting: Pulmonary Disease

## 2014-02-19 NOTE — Progress Notes (Signed)
Pt completed 28 out of 36 exercise sessions in the Cardiac rehab Phase II program. Pt plans to continue home exercise with the cardiac rehab maintenance program here at [facility].  Medication list reconciled.  Repeat PHQ2 score 0.  Pt remains positive about his future and is eager to participate in the cardiac rehab maintenance program.   Pt's short term goal was to walk further.  Pt was able to meet this goal to increase the distance he was able to walk.  Pt's long term goal was to decrease weight.  Pt was unable to meet this partly due to the progression of CHF.  Pt did decrease his weight to .6kg  Will continue to monitor. Cherre Huger, BSN

## 2014-02-21 ENCOUNTER — Other Ambulatory Visit: Payer: Self-pay | Admitting: Endocrinology

## 2014-02-22 ENCOUNTER — Encounter (HOSPITAL_COMMUNITY)
Admission: RE | Admit: 2014-02-22 | Discharge: 2014-02-22 | Disposition: A | Discharge status: Home / Self Care | Payer: Self-pay | Source: Ambulatory Visit | Attending: Cardiovascular Disease | Admitting: Cardiovascular Disease

## 2014-02-22 ENCOUNTER — Other Ambulatory Visit: Payer: Self-pay | Admitting: Endocrinology

## 2014-02-22 DIAGNOSIS — Z5189 Encounter for other specified aftercare: Secondary | ICD-10-CM | POA: Insufficient documentation

## 2014-02-22 DIAGNOSIS — I252 Old myocardial infarction: Secondary | ICD-10-CM | POA: Insufficient documentation

## 2014-02-24 ENCOUNTER — Encounter (HOSPITAL_COMMUNITY)
Admission: RE | Admit: 2014-02-24 | Discharge: 2014-02-24 | Disposition: A | Discharge status: Home / Self Care | Payer: Self-pay | Source: Ambulatory Visit | Attending: Cardiovascular Disease | Admitting: Cardiovascular Disease

## 2014-02-26 ENCOUNTER — Encounter (HOSPITAL_COMMUNITY)
Admission: RE | Admit: 2014-02-26 | Discharge: 2014-02-26 | Disposition: A | Discharge status: Home / Self Care | Payer: Self-pay | Source: Ambulatory Visit | Attending: Cardiovascular Disease | Admitting: Cardiovascular Disease

## 2014-03-01 ENCOUNTER — Encounter (HOSPITAL_COMMUNITY): Payer: Self-pay

## 2014-03-03 ENCOUNTER — Encounter (HOSPITAL_COMMUNITY): Payer: Self-pay

## 2014-03-05 ENCOUNTER — Encounter (HOSPITAL_COMMUNITY): Payer: Self-pay

## 2014-03-08 ENCOUNTER — Encounter (HOSPITAL_COMMUNITY): Payer: Self-pay

## 2014-03-10 ENCOUNTER — Encounter (HOSPITAL_COMMUNITY): Payer: Self-pay

## 2014-03-11 ENCOUNTER — Telehealth: Payer: Self-pay | Admitting: Endocrinology

## 2014-03-11 MED ORDER — HYDROCODONE-ACETAMINOPHEN 10-325 MG PO TABS: 1.0000 | ORAL_TABLET | Freq: Four times a day (QID) | ORAL | Status: DC | PRN

## 2014-03-11 NOTE — Telephone Encounter (Signed)
i printed 

## 2014-03-11 NOTE — Telephone Encounter (Signed)
See below. Pt was last seen on 02/08/2014 and medication was last filled on 02/08/2014.  Thanks!

## 2014-03-11 NOTE — Telephone Encounter (Signed)
Pt would like Rx refill on his hydrocodone   Call back:[phone]  Thank You

## 2014-03-12 ENCOUNTER — Encounter (HOSPITAL_COMMUNITY): Payer: Self-pay

## 2014-03-12 NOTE — Telephone Encounter (Signed)
Pt's wife informed that script is ready for pick up. Script placed up front

## 2014-03-15 ENCOUNTER — Encounter (HOSPITAL_COMMUNITY): Payer: Self-pay

## 2014-03-16 ENCOUNTER — Encounter: Payer: Self-pay | Admitting: Gastroenterology

## 2014-03-17 ENCOUNTER — Encounter (HOSPITAL_COMMUNITY): Payer: Self-pay

## 2014-03-19 ENCOUNTER — Encounter (HOSPITAL_COMMUNITY): Payer: Self-pay

## 2014-03-21 ENCOUNTER — Other Ambulatory Visit: Payer: Self-pay | Admitting: Endocrinology

## 2014-03-22 ENCOUNTER — Encounter (HOSPITAL_COMMUNITY)
Admission: RE | Admit: 2014-03-22 | Discharge: 2014-03-22 | Disposition: A | Discharge status: Home / Self Care | Payer: Self-pay | Source: Ambulatory Visit | Attending: Cardiovascular Disease | Admitting: Cardiovascular Disease

## 2014-03-22 ENCOUNTER — Other Ambulatory Visit: Payer: Self-pay | Admitting: Cardiovascular Disease

## 2014-03-22 ENCOUNTER — Other Ambulatory Visit: Payer: Self-pay | Admitting: Endocrinology

## 2014-03-22 NOTE — Telephone Encounter (Signed)
Ok to refill trazodone. Thanks.  

## 2014-03-23 ENCOUNTER — Ambulatory Visit: Payer: Medicare Other | Admitting: Cardiovascular Disease

## 2014-03-24 ENCOUNTER — Encounter (HOSPITAL_COMMUNITY): Payer: Medicare Other

## 2014-03-24 DIAGNOSIS — I5042 Chronic combined systolic (congestive) and diastolic (congestive) heart failure: Secondary | ICD-10-CM | POA: Insufficient documentation

## 2014-03-24 DIAGNOSIS — I2589 Other forms of chronic ischemic heart disease: Secondary | ICD-10-CM | POA: Insufficient documentation

## 2014-03-24 DIAGNOSIS — E785 Hyperlipidemia, unspecified: Secondary | ICD-10-CM | POA: Insufficient documentation

## 2014-03-24 DIAGNOSIS — Z5189 Encounter for other specified aftercare: Secondary | ICD-10-CM | POA: Insufficient documentation

## 2014-03-24 DIAGNOSIS — I2510 Atherosclerotic heart disease of native coronary artery without angina pectoris: Secondary | ICD-10-CM | POA: Insufficient documentation

## 2014-03-24 DIAGNOSIS — I252 Old myocardial infarction: Secondary | ICD-10-CM | POA: Insufficient documentation

## 2014-03-24 DIAGNOSIS — I10 Essential (primary) hypertension: Secondary | ICD-10-CM | POA: Insufficient documentation

## 2014-03-26 ENCOUNTER — Encounter (HOSPITAL_COMMUNITY): Payer: Self-pay

## 2014-03-29 ENCOUNTER — Encounter (HOSPITAL_COMMUNITY)
Admission: RE | Admit: 2014-03-29 | Discharge: 2014-03-29 | Disposition: A | Discharge status: Home / Self Care | Payer: Self-pay | Source: Ambulatory Visit | Attending: Cardiovascular Disease | Admitting: Cardiovascular Disease

## 2014-03-30 ENCOUNTER — Other Ambulatory Visit: Payer: Self-pay | Admitting: Physician Assistant

## 2014-03-30 ENCOUNTER — Telehealth: Payer: Self-pay | Admitting: Pulmonary Disease

## 2014-03-30 ENCOUNTER — Other Ambulatory Visit: Payer: Self-pay | Admitting: Cardiovascular Disease

## 2014-03-30 DIAGNOSIS — J449 Chronic obstructive pulmonary disease, unspecified: Secondary | ICD-10-CM

## 2014-03-30 NOTE — Telephone Encounter (Signed)
lmtcb for melissa 

## 2014-03-30 NOTE — Telephone Encounter (Signed)
Does pt needs new O2 sats? New order?

## 2014-03-31 ENCOUNTER — Encounter (HOSPITAL_COMMUNITY)
Admission: RE | Admit: 2014-03-31 | Discharge: 2014-03-31 | Disposition: A | Discharge status: Home / Self Care | Payer: Self-pay | Source: Ambulatory Visit | Attending: Cardiovascular Disease | Admitting: Cardiovascular Disease

## 2014-03-31 NOTE — Telephone Encounter (Signed)
I have sent Melissa a staff message to clarify what exactly is needed. Walk test? Face to face? Etc. I will follow-up on staff message. Bing, CMA

## 2014-04-01 NOTE — Telephone Encounter (Signed)
Per staff message from [place] with [facility] the pt needs the following: We will need to get new sats and ov notes speaking to need of o2. Thank you!

## 2014-04-01 NOTE — Telephone Encounter (Signed)
I spoke with the [name] and have scheduled him for OV on 04-13-14 for face to face. The [name] only uses nocturnal oxygen so he will need an ONO as well. Dr. Gwenette Greet do you want to go ahead and order the ono now before the appt or wait until the OV to order? Thanks. Marbury Bing, CMA

## 2014-04-01 NOTE — Telephone Encounter (Signed)
Order placed for ONO on RA  Staff msg sent to [facility] to be aware

## 2014-04-01 NOTE — Telephone Encounter (Signed)
Ok to order now

## 2014-04-02 ENCOUNTER — Encounter (HOSPITAL_COMMUNITY): Payer: Self-pay

## 2014-04-05 ENCOUNTER — Encounter (HOSPITAL_COMMUNITY)
Admission: RE | Admit: 2014-04-05 | Discharge: 2014-04-05 | Disposition: A | Discharge status: Home / Self Care | Payer: Self-pay | Source: Ambulatory Visit | Attending: Cardiovascular Disease | Admitting: Cardiovascular Disease

## 2014-04-05 DIAGNOSIS — I10 Essential (primary) hypertension: Secondary | ICD-10-CM | POA: Diagnosis not present

## 2014-04-05 DIAGNOSIS — I2510 Atherosclerotic heart disease of native coronary artery without angina pectoris: Secondary | ICD-10-CM | POA: Diagnosis not present

## 2014-04-05 DIAGNOSIS — N183 Chronic kidney disease, stage 3 unspecified: Secondary | ICD-10-CM | POA: Diagnosis not present

## 2014-04-05 DIAGNOSIS — E669 Obesity, unspecified: Secondary | ICD-10-CM | POA: Diagnosis not present

## 2014-04-05 DIAGNOSIS — J449 Chronic obstructive pulmonary disease, unspecified: Secondary | ICD-10-CM | POA: Diagnosis not present

## 2014-04-07 ENCOUNTER — Encounter (HOSPITAL_COMMUNITY)
Admission: RE | Admit: 2014-04-07 | Discharge: 2014-04-07 | Disposition: A | Discharge status: Home / Self Care | Payer: Self-pay | Source: Ambulatory Visit | Attending: Cardiovascular Disease | Admitting: Cardiovascular Disease

## 2014-04-07 ENCOUNTER — Encounter: Payer: Self-pay | Admitting: Pulmonary Disease

## 2014-04-07 ENCOUNTER — Telehealth: Payer: Self-pay | Admitting: *Deleted

## 2014-04-07 NOTE — Telephone Encounter (Signed)
ONO results have been received and placed on your desk. Please advise Dr Gwenette Greet, thanks.

## 2014-04-07 NOTE — Telephone Encounter (Signed)
Let the pt know that his oxygen level only fell to 84%, and only spent 36 sec the entire night less than 88% Technically he may still qualify for oxygen,but we will discuss at the upcoming visit whether he needs oxygen.

## 2014-04-08 ENCOUNTER — Ambulatory Visit (INDEPENDENT_AMBULATORY_CARE_PROVIDER_SITE_OTHER): Payer: Medicare Other | Admitting: Physician Assistant

## 2014-04-08 ENCOUNTER — Encounter (INDEPENDENT_AMBULATORY_CARE_PROVIDER_SITE_OTHER): Payer: Self-pay

## 2014-04-08 VITALS — BP 128/78 | HR 80 | Temp 97.8°F | Resp 16 | Ht 63.0 in | Wt 216.4 lb

## 2014-04-08 DIAGNOSIS — Z9884 Bariatric surgery status: Secondary | ICD-10-CM | POA: Diagnosis not present

## 2014-04-08 NOTE — Telephone Encounter (Signed)
Results have been explained to patient, pt expressed understanding.  Appt with [facility] 04/13/14 at 11:45 Nothing further needed.

## 2014-04-08 NOTE — Patient Instructions (Signed)
Return in three months. Focus on good food choices as well as physical activity. Return sooner if you have an increase in hunger, portion sizes or weight. Return also for difficulty swallowing, night cough, reflux.   

## 2014-04-08 NOTE — Progress Notes (Signed)
  HISTORY: [name]. is a 73 y.o.male who received an AP-Large lap-band in June 2012 by Dr. Lucia Gaskins. He comes in today with 7 lbs weight loss since his last visit 2 months ago. He's lost 28 lbs since surgery. He has occasional regurgitation that he attributes to eating too quickly. His portion sizes are remaining small and overall his hunger is under good control. He continues with cardiopulmonary rehab and is exercising otherwise as well. Fluid retention is a variable, as he can gain and lose 2 lbs in the course of a couple of days.  VITAL SIGNS: Filed Vitals:   04/08/14 1117  BP: 128/78  Pulse: 80  Temp: 97.8 F (36.6 C)  Resp: 16    PHYSICAL EXAM: Physical exam reveals a very well-appearing 72 y.o.male in no apparent distress Neurologic: Awake, alert, oriented Psych: Bright affect, conversant Respiratory: Breathing even and unlabored. No stridor or wheezing Extremities: Atraumatic, good range of motion. Skin: Warm, Dry, no rashes Musculoskeletal: Normal gait, Joints normal  ASSESMENT: 73 y.o.  male  s/p AP-Large lap-band.   PLAN: We discussed how he's still taking relatively small portions and is feeling full from this. I think he's in the green zone in terms of the band and that his weight loss will continue to be modest. We'll have him return in three months or sooner if needed.

## 2014-04-09 ENCOUNTER — Encounter (HOSPITAL_COMMUNITY): Payer: Self-pay

## 2014-04-09 ENCOUNTER — Ambulatory Visit (INDEPENDENT_AMBULATORY_CARE_PROVIDER_SITE_OTHER): Payer: Medicare Other | Admitting: Cardiovascular Disease

## 2014-04-09 ENCOUNTER — Encounter: Payer: Self-pay | Admitting: Cardiovascular Disease

## 2014-04-09 VITALS — BP 130/56 | HR 65 | Ht 63.0 in | Wt 216.0 lb

## 2014-04-09 DIAGNOSIS — I2589 Other forms of chronic ischemic heart disease: Secondary | ICD-10-CM | POA: Diagnosis not present

## 2014-04-09 DIAGNOSIS — I5042 Chronic combined systolic (congestive) and diastolic (congestive) heart failure: Secondary | ICD-10-CM

## 2014-04-09 DIAGNOSIS — E785 Hyperlipidemia, unspecified: Secondary | ICD-10-CM

## 2014-04-09 DIAGNOSIS — I2510 Atherosclerotic heart disease of native coronary artery without angina pectoris: Secondary | ICD-10-CM | POA: Diagnosis not present

## 2014-04-09 DIAGNOSIS — I10 Essential (primary) hypertension: Secondary | ICD-10-CM

## 2014-04-09 DIAGNOSIS — I509 Heart failure, unspecified: Secondary | ICD-10-CM

## 2014-04-09 DIAGNOSIS — I255 Ischemic cardiomyopathy: Secondary | ICD-10-CM
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
## 2014-04-09 NOTE — Patient Instructions (Signed)
Your physician wants you to follow-up in:  6 months. You will receive a reminder letter in the mail two months in advance. If you don't receive a letter, please call our office to schedule the follow-up appointment.   

## 2014-04-12 ENCOUNTER — Encounter (HOSPITAL_COMMUNITY): Payer: Self-pay

## 2014-04-13 ENCOUNTER — Ambulatory Visit (INDEPENDENT_AMBULATORY_CARE_PROVIDER_SITE_OTHER): Payer: Medicare Other | Admitting: Pulmonary Disease

## 2014-04-13 ENCOUNTER — Other Ambulatory Visit: Payer: Self-pay | Admitting: Endocrinology

## 2014-04-13 ENCOUNTER — Telehealth: Payer: Self-pay | Admitting: Endocrinology

## 2014-04-13 ENCOUNTER — Encounter: Payer: Self-pay | Admitting: Pulmonary Disease

## 2014-04-13 VITALS — BP 124/62 | HR 68 | Temp 97.8°F | Ht 63.0 in | Wt 218.8 lb

## 2014-04-13 DIAGNOSIS — J449 Chronic obstructive pulmonary disease, unspecified: Secondary | ICD-10-CM

## 2014-04-13 MED ORDER — HYDROCODONE-ACETAMINOPHEN 10-325 MG PO TABS: 1.0000 | ORAL_TABLET | Freq: Four times a day (QID) | ORAL | Status: DC | PRN

## 2014-04-13 NOTE — Assessment & Plan Note (Signed)
The pt is doing well with his oxygen at night, and I have asked him to continue.

## 2014-04-13 NOTE — Telephone Encounter (Signed)
[name] would like a refill on Hydro-codone   Thank you :)

## 2014-04-13 NOTE — Telephone Encounter (Signed)
Pt informed that Rx is ready for pick up. Script placed up front.  

## 2014-04-13 NOTE — Patient Instructions (Signed)
Continue with current meds and oxygen at night while sleeping Keep scheduled followup with me.

## 2014-04-13 NOTE — Telephone Encounter (Signed)
Pt is requesting refill on hydrocodone.  Pt was last seen on 02/08/2014 and medication was last filled on 03/12/2014. Please advise, Thanks!

## 2014-04-13 NOTE — Telephone Encounter (Signed)
i printed 

## 2014-04-13 NOTE — Progress Notes (Signed)
   Subjective:    Patient ID: [name] Alcon., male    DOB: 23-Jan-1941, 72 y.o.   MRN: [id number]  HPI The patient comes in today at the request of his home care company to recertify for his nocturnal oxygen. He is wearing oxygen compliantly at night, and feels that helps his sleep and how he feels during the day as well. He is staying on his medications compliantly.   Review of Systems  Constitutional: Negative for fever and unexpected weight change.  HENT: Negative for congestion, dental problem, ear pain, nosebleeds, postnasal drip, rhinorrhea, sinus pressure, sneezing, sore throat and trouble swallowing.   Eyes: Negative for redness and itching.       Dry eyes  Respiratory: Positive for shortness of breath. Negative for cough, chest tightness and wheezing.   Cardiovascular: Negative for palpitations and leg swelling.  Gastrointestinal: Negative for nausea and vomiting.  Genitourinary: Negative for dysuria.  Musculoskeletal: Negative for joint swelling.  Skin: Negative for rash.  Neurological: Negative for headaches.  Hematological: Does not bruise/bleed easily.  Psychiatric/Behavioral: Negative for dysphoric mood. The patient is not nervous/anxious.        Objective:   Physical Exam Overweight male in no acute distress Nose without purulence or discharge noted Lower extremities with mild edema, no cyanosis Alert and oriented, moves all 4.        Assessment & Plan:

## 2014-04-14 ENCOUNTER — Encounter (HOSPITAL_COMMUNITY)
Admission: RE | Admit: 2014-04-14 | Discharge: 2014-04-14 | Disposition: A | Discharge status: Home / Self Care | Payer: Self-pay | Source: Ambulatory Visit | Attending: Cardiovascular Disease | Admitting: Cardiovascular Disease

## 2014-04-16 ENCOUNTER — Other Ambulatory Visit: Payer: Self-pay | Admitting: Endocrinology

## 2014-04-16 ENCOUNTER — Encounter (HOSPITAL_COMMUNITY)
Admission: RE | Admit: 2014-04-16 | Discharge: 2014-04-16 | Disposition: A | Discharge status: Home / Self Care | Payer: Self-pay | Source: Ambulatory Visit | Attending: Cardiovascular Disease | Admitting: Cardiovascular Disease

## 2014-04-19 ENCOUNTER — Encounter (HOSPITAL_COMMUNITY)
Admission: RE | Admit: 2014-04-19 | Discharge: 2014-04-19 | Disposition: A | Discharge status: Home / Self Care | Payer: Self-pay | Source: Ambulatory Visit | Attending: Cardiovascular Disease | Admitting: Cardiovascular Disease

## 2014-04-21 ENCOUNTER — Encounter (HOSPITAL_COMMUNITY): Payer: Self-pay

## 2014-04-23 ENCOUNTER — Encounter (HOSPITAL_COMMUNITY)
Admission: RE | Admit: 2014-04-23 | Discharge: 2014-04-23 | Disposition: A | Discharge status: Home / Self Care | Payer: Self-pay | Source: Ambulatory Visit | Attending: Cardiovascular Disease | Admitting: Cardiovascular Disease

## 2014-04-23 ENCOUNTER — Other Ambulatory Visit: Payer: Self-pay | Admitting: Endocrinology

## 2014-04-23 DIAGNOSIS — I2589 Other forms of chronic ischemic heart disease: Secondary | ICD-10-CM | POA: Insufficient documentation

## 2014-04-23 DIAGNOSIS — I252 Old myocardial infarction: Secondary | ICD-10-CM | POA: Insufficient documentation

## 2014-04-23 DIAGNOSIS — E785 Hyperlipidemia, unspecified: Secondary | ICD-10-CM | POA: Insufficient documentation

## 2014-04-23 DIAGNOSIS — Z5189 Encounter for other specified aftercare: Secondary | ICD-10-CM | POA: Insufficient documentation

## 2014-04-23 DIAGNOSIS — I10 Essential (primary) hypertension: Secondary | ICD-10-CM | POA: Insufficient documentation

## 2014-04-23 DIAGNOSIS — I2510 Atherosclerotic heart disease of native coronary artery without angina pectoris: Secondary | ICD-10-CM | POA: Insufficient documentation

## 2014-04-23 DIAGNOSIS — I5042 Chronic combined systolic (congestive) and diastolic (congestive) heart failure: Secondary | ICD-10-CM | POA: Insufficient documentation

## 2014-04-26 ENCOUNTER — Encounter (HOSPITAL_COMMUNITY)
Admission: RE | Admit: 2014-04-26 | Discharge: 2014-04-26 | Disposition: A | Discharge status: Home / Self Care | Payer: Self-pay | Source: Ambulatory Visit | Attending: Cardiovascular Disease | Admitting: Cardiovascular Disease

## 2014-04-28 ENCOUNTER — Encounter (HOSPITAL_COMMUNITY): Payer: Self-pay

## 2014-04-30 ENCOUNTER — Encounter (HOSPITAL_COMMUNITY)
Admission: RE | Admit: 2014-04-30 | Discharge: 2014-04-30 | Disposition: A | Discharge status: Home / Self Care | Payer: Self-pay | Source: Ambulatory Visit | Attending: Cardiovascular Disease | Admitting: Cardiovascular Disease

## 2014-05-01 ENCOUNTER — Other Ambulatory Visit: Payer: Self-pay | Admitting: Endocrinology

## 2014-05-03 ENCOUNTER — Encounter (HOSPITAL_COMMUNITY)
Admission: RE | Admit: 2014-05-03 | Discharge: 2014-05-03 | Disposition: A | Discharge status: Home / Self Care | Payer: Self-pay | Source: Ambulatory Visit | Attending: Cardiovascular Disease | Admitting: Cardiovascular Disease

## 2014-05-05 ENCOUNTER — Encounter (HOSPITAL_COMMUNITY)
Admission: RE | Admit: 2014-05-05 | Discharge: 2014-05-05 | Disposition: A | Discharge status: Home / Self Care | Payer: Self-pay | Source: Ambulatory Visit | Attending: Cardiovascular Disease | Admitting: Cardiovascular Disease

## 2014-05-07 ENCOUNTER — Encounter (HOSPITAL_COMMUNITY): Payer: Self-pay

## 2014-05-10 ENCOUNTER — Ambulatory Visit (INDEPENDENT_AMBULATORY_CARE_PROVIDER_SITE_OTHER): Payer: Medicare Other | Admitting: Endocrinology

## 2014-05-10 ENCOUNTER — Encounter: Payer: Self-pay | Admitting: Endocrinology

## 2014-05-10 ENCOUNTER — Encounter (HOSPITAL_COMMUNITY)
Admission: RE | Admit: 2014-05-10 | Discharge: 2014-05-10 | Disposition: A | Discharge status: Home / Self Care | Payer: Self-pay | Source: Ambulatory Visit | Attending: Cardiovascular Disease | Admitting: Cardiovascular Disease

## 2014-05-10 VITALS — BP 112/60 | HR 68 | Temp 97.5°F | Ht 63.0 in | Wt 218.0 lb

## 2014-05-10 DIAGNOSIS — R0602 Shortness of breath: Secondary | ICD-10-CM

## 2014-05-10 DIAGNOSIS — I2510 Atherosclerotic heart disease of native coronary artery without angina pectoris: Secondary | ICD-10-CM

## 2014-05-10 DIAGNOSIS — N259 Disorder resulting from impaired renal tubular function, unspecified: Secondary | ICD-10-CM

## 2014-05-10 DIAGNOSIS — E1029 Type 1 diabetes mellitus with other diabetic kidney complication: Secondary | ICD-10-CM

## 2014-05-10 LAB — HEMOGLOBIN A1C: Hgb A1c MFr Bld: 6.9 % — ABNORMAL HIGH (ref 4.6–6.5)

## 2014-05-10 LAB — BRAIN NATRIURETIC PEPTIDE: Pro B Natriuretic peptide (BNP): 241 pg/mL — ABNORMAL HIGH (ref 0.0–100.0)

## 2014-05-10 MED ORDER — HYDROCODONE-ACETAMINOPHEN 10-325 MG PO TABS: 1.0000 | ORAL_TABLET | Freq: Four times a day (QID) | ORAL | Status: DC | PRN

## 2014-05-10 NOTE — Patient Instructions (Signed)
blood tests are being requested for you today.  We'll contact you with results. check your blood sugar twice a day.  vary the time of day when you check, between before the 3 meals, and at bedtime.  also check if you have symptoms of your blood sugar being too high or too low.  please keep a record of the readings and bring it to your next appointment here.  You can write it on any piece of paper.  please call us sooner if your blood sugar goes below 70, or if you have a lot of readings over 200. Please come back for a follow-up appointment in 3 months.  The key to getting your blood sugar lower safely, is further weight loss.  Please continue to follow-up with the weight-loss surgery specialists.
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
## 2014-05-11 LAB — BASIC METABOLIC PANEL WITH GFR
BUN: 25 mg/dL — ABNORMAL HIGH (ref 6–23)
CHLORIDE: 98 meq/L (ref 96–112)
CO2: 31 mEq/L (ref 19–32)
Calcium: 8.9 mg/dL (ref 8.4–10.5)
Creat: 1.35 mg/dL (ref 0.50–1.35)
GFR, EST NON AFRICAN AMERICAN: 52 mL/min — AB
GFR, Est African American: 60 mL/min
Glucose, Bld: 154 mg/dL — ABNORMAL HIGH (ref 70–99)
POTASSIUM: 4 meq/L (ref 3.5–5.3)
Sodium: 134 mEq/L — ABNORMAL LOW (ref 135–145)

## 2014-05-12 ENCOUNTER — Encounter (HOSPITAL_COMMUNITY)
Admission: RE | Admit: 2014-05-12 | Discharge: 2014-05-12 | Disposition: A | Discharge status: Home / Self Care | Payer: Self-pay | Source: Ambulatory Visit | Attending: Cardiovascular Disease | Admitting: Cardiovascular Disease

## 2014-05-14 ENCOUNTER — Encounter (HOSPITAL_COMMUNITY)
Admission: RE | Admit: 2014-05-14 | Discharge: 2014-05-14 | Disposition: A | Discharge status: Home / Self Care | Payer: Self-pay | Source: Ambulatory Visit | Attending: Cardiovascular Disease | Admitting: Cardiovascular Disease

## 2014-05-19 ENCOUNTER — Encounter (HOSPITAL_COMMUNITY)
Admission: RE | Admit: 2014-05-19 | Discharge: 2014-05-19 | Disposition: A | Discharge status: Home / Self Care | Payer: Self-pay | Source: Ambulatory Visit | Attending: Cardiovascular Disease | Admitting: Cardiovascular Disease

## 2014-05-21 ENCOUNTER — Encounter (HOSPITAL_COMMUNITY)
Admission: RE | Admit: 2014-05-21 | Discharge: 2014-05-21 | Disposition: A | Discharge status: Home / Self Care | Payer: Self-pay | Source: Ambulatory Visit | Attending: Cardiovascular Disease | Admitting: Cardiovascular Disease

## 2014-05-23 ENCOUNTER — Other Ambulatory Visit: Payer: Self-pay | Admitting: Endocrinology

## 2014-05-24 ENCOUNTER — Encounter (HOSPITAL_COMMUNITY): Payer: Medicare Other

## 2014-05-24 DIAGNOSIS — I2589 Other forms of chronic ischemic heart disease: Secondary | ICD-10-CM | POA: Insufficient documentation

## 2014-05-24 DIAGNOSIS — I252 Old myocardial infarction: Secondary | ICD-10-CM | POA: Insufficient documentation

## 2014-05-24 DIAGNOSIS — Z5189 Encounter for other specified aftercare: Secondary | ICD-10-CM | POA: Insufficient documentation

## 2014-05-24 DIAGNOSIS — I10 Essential (primary) hypertension: Secondary | ICD-10-CM | POA: Insufficient documentation

## 2014-05-24 DIAGNOSIS — E785 Hyperlipidemia, unspecified: Secondary | ICD-10-CM | POA: Insufficient documentation

## 2014-05-24 DIAGNOSIS — I2510 Atherosclerotic heart disease of native coronary artery without angina pectoris: Secondary | ICD-10-CM | POA: Insufficient documentation

## 2014-05-24 DIAGNOSIS — I5042 Chronic combined systolic (congestive) and diastolic (congestive) heart failure: Secondary | ICD-10-CM | POA: Insufficient documentation

## 2014-05-26 ENCOUNTER — Encounter (HOSPITAL_COMMUNITY)
Admission: RE | Admit: 2014-05-26 | Discharge: 2014-05-26 | Disposition: A | Discharge status: Home / Self Care | Payer: Self-pay | Source: Ambulatory Visit | Attending: Cardiovascular Disease | Admitting: Cardiovascular Disease

## 2014-05-28 ENCOUNTER — Encounter (HOSPITAL_COMMUNITY)
Admission: RE | Admit: 2014-05-28 | Discharge: 2014-05-28 | Disposition: A | Discharge status: Home / Self Care | Payer: Self-pay | Source: Ambulatory Visit | Attending: Cardiovascular Disease | Admitting: Cardiovascular Disease

## 2014-05-31 ENCOUNTER — Encounter (HOSPITAL_COMMUNITY)
Admission: RE | Admit: 2014-05-31 | Discharge: 2014-05-31 | Disposition: A | Discharge status: Home / Self Care | Payer: Self-pay | Source: Ambulatory Visit | Attending: Cardiovascular Disease | Admitting: Cardiovascular Disease

## 2014-06-02 ENCOUNTER — Encounter: Payer: Self-pay | Admitting: Pulmonary Disease

## 2014-06-02 ENCOUNTER — Encounter (HOSPITAL_COMMUNITY)
Admission: RE | Admit: 2014-06-02 | Discharge: 2014-06-02 | Disposition: A | Discharge status: Home / Self Care | Payer: Self-pay | Source: Ambulatory Visit | Attending: Cardiovascular Disease | Admitting: Cardiovascular Disease

## 2014-06-04 ENCOUNTER — Encounter (HOSPITAL_COMMUNITY)
Admission: RE | Admit: 2014-06-04 | Discharge: 2014-06-04 | Disposition: A | Discharge status: Home / Self Care | Payer: Self-pay | Source: Ambulatory Visit | Attending: Cardiovascular Disease | Admitting: Cardiovascular Disease

## 2014-06-07 ENCOUNTER — Encounter (HOSPITAL_COMMUNITY): Payer: Self-pay

## 2014-06-08 ENCOUNTER — Other Ambulatory Visit: Payer: Self-pay | Admitting: *Deleted

## 2014-06-08 ENCOUNTER — Telehealth: Payer: Self-pay | Admitting: *Deleted

## 2014-06-08 MED ORDER — HYDROCODONE-ACETAMINOPHEN 10-325 MG PO TABS: 1.0000 | ORAL_TABLET | Freq: Four times a day (QID) | ORAL | Status: DC | PRN

## 2014-06-08 NOTE — Telephone Encounter (Signed)
Done

## 2014-06-08 NOTE — Telephone Encounter (Signed)
Ok.  I will sign

## 2014-06-08 NOTE — Telephone Encounter (Signed)
Can you print this out for me, I can't print up here, your Dr. Cruzita Lederer said she would sign for it.  Thanks

## 2014-06-08 NOTE — Telephone Encounter (Signed)
Pt is requesting a refill of Hydrocodone, I told him Dr. Loanne Drilling was out of the office for 2 weeks and he wanted to see if you would fill it for him?  Please advise

## 2014-06-09 ENCOUNTER — Encounter (HOSPITAL_COMMUNITY)
Admission: RE | Admit: 2014-06-09 | Discharge: 2014-06-09 | Disposition: A | Discharge status: Home / Self Care | Payer: Self-pay | Source: Ambulatory Visit | Attending: Cardiovascular Disease | Admitting: Cardiovascular Disease

## 2014-06-09 ENCOUNTER — Encounter: Payer: Self-pay | Admitting: *Deleted

## 2014-06-09 DIAGNOSIS — Z79899 Other long term (current) drug therapy: Secondary | ICD-10-CM | POA: Diagnosis not present

## 2014-06-11 ENCOUNTER — Encounter (HOSPITAL_COMMUNITY)
Admission: RE | Admit: 2014-06-11 | Discharge: 2014-06-11 | Disposition: A | Discharge status: Home / Self Care | Payer: Self-pay | Source: Ambulatory Visit | Attending: Cardiovascular Disease | Admitting: Cardiovascular Disease

## 2014-06-14 ENCOUNTER — Encounter (HOSPITAL_COMMUNITY)
Admission: RE | Admit: 2014-06-14 | Discharge: 2014-06-14 | Disposition: A | Discharge status: Home / Self Care | Payer: Self-pay | Source: Ambulatory Visit | Attending: Cardiovascular Disease | Admitting: Cardiovascular Disease

## 2014-06-16 ENCOUNTER — Encounter (HOSPITAL_COMMUNITY)
Admission: RE | Admit: 2014-06-16 | Discharge: 2014-06-16 | Disposition: A | Discharge status: Home / Self Care | Payer: Self-pay | Source: Ambulatory Visit | Attending: Cardiovascular Disease | Admitting: Cardiovascular Disease

## 2014-06-17 ENCOUNTER — Other Ambulatory Visit: Payer: Self-pay | Admitting: Endocrinology

## 2014-06-18 ENCOUNTER — Encounter (HOSPITAL_COMMUNITY)
Admission: RE | Admit: 2014-06-18 | Discharge: 2014-06-18 | Disposition: A | Discharge status: Home / Self Care | Payer: Self-pay | Source: Ambulatory Visit | Attending: Cardiovascular Disease | Admitting: Cardiovascular Disease

## 2014-06-18 ENCOUNTER — Other Ambulatory Visit: Payer: Self-pay | Admitting: Endocrinology

## 2014-06-21 ENCOUNTER — Encounter (HOSPITAL_COMMUNITY): Payer: Self-pay

## 2014-06-23 ENCOUNTER — Other Ambulatory Visit: Payer: Self-pay | Admitting: Cardiovascular Disease

## 2014-06-23 ENCOUNTER — Other Ambulatory Visit: Payer: Self-pay | Admitting: Endocrinology

## 2014-06-23 ENCOUNTER — Encounter (HOSPITAL_COMMUNITY): Payer: Self-pay

## 2014-06-23 DIAGNOSIS — E785 Hyperlipidemia, unspecified: Secondary | ICD-10-CM | POA: Insufficient documentation

## 2014-06-23 DIAGNOSIS — I2589 Other forms of chronic ischemic heart disease: Secondary | ICD-10-CM | POA: Insufficient documentation

## 2014-06-23 DIAGNOSIS — I5042 Chronic combined systolic (congestive) and diastolic (congestive) heart failure: Secondary | ICD-10-CM | POA: Insufficient documentation

## 2014-06-23 DIAGNOSIS — I10 Essential (primary) hypertension: Secondary | ICD-10-CM | POA: Insufficient documentation

## 2014-06-23 DIAGNOSIS — Z5189 Encounter for other specified aftercare: Secondary | ICD-10-CM | POA: Insufficient documentation

## 2014-06-23 DIAGNOSIS — I252 Old myocardial infarction: Secondary | ICD-10-CM | POA: Insufficient documentation

## 2014-06-23 DIAGNOSIS — I2510 Atherosclerotic heart disease of native coronary artery without angina pectoris: Secondary | ICD-10-CM | POA: Insufficient documentation

## 2014-06-28 ENCOUNTER — Encounter (HOSPITAL_COMMUNITY)
Admission: RE | Admit: 2014-06-28 | Discharge: 2014-06-28 | Disposition: A | Discharge status: Home / Self Care | Payer: Self-pay | Source: Ambulatory Visit | Attending: Cardiovascular Disease | Admitting: Cardiovascular Disease

## 2014-06-28 ENCOUNTER — Encounter: Payer: Self-pay | Admitting: Pulmonary Disease

## 2014-06-28 ENCOUNTER — Ambulatory Visit (INDEPENDENT_AMBULATORY_CARE_PROVIDER_SITE_OTHER): Payer: Medicare Other | Admitting: Pulmonary Disease

## 2014-06-28 VITALS — BP 112/70 | HR 80 | Temp 97.8°F | Ht 63.0 in | Wt 221.2 lb

## 2014-06-28 DIAGNOSIS — J449 Chronic obstructive pulmonary disease, unspecified: Secondary | ICD-10-CM | POA: Diagnosis not present

## 2014-06-28 DIAGNOSIS — I2510 Atherosclerotic heart disease of native coronary artery without angina pectoris: Secondary | ICD-10-CM | POA: Diagnosis not present

## 2014-06-28 NOTE — Progress Notes (Signed)
   Subjective:    Patient ID: [name] Alcon., male    DOB: 30-Jan-1941, 73 y.o.   MRN: [id number]  HPI Patient comes in today for followup of his known COPD with chronic respiratory failure. He had a lot of issues last week with the heat and humidity, causing him to have increased shortness of breath. He was much better over the weekend in today, and feels that he is back to his usual baseline. He denies any significant cough or mucus production, nor does he have any congestion. He is been watching his weight carefully given his ischemic cardiomyopathy   Review of Systems  Constitutional: Negative for fever and unexpected weight change.  HENT: Negative for congestion, dental problem, ear pain, nosebleeds, postnasal drip, rhinorrhea, sinus pressure, sneezing, sore throat and trouble swallowing.   Eyes: Negative for redness and itching.  Respiratory: Positive for cough and shortness of breath. Negative for chest tightness and wheezing.   Cardiovascular: Negative for palpitations and leg swelling.  Gastrointestinal: Negative for nausea and vomiting.  Genitourinary: Negative for dysuria.  Musculoskeletal: Negative for joint swelling.  Skin: Negative for rash.  Neurological: Negative for headaches.  Hematological: Does not bruise/bleed easily.  Psychiatric/Behavioral: Negative for dysphoric mood. The patient is not nervous/anxious.        Objective:   Physical Exam Obese male in no acute distress Nose without purulence or discharge noted Neck without lymphadenopathy or thyromegaly Chest with no wheezes or rhonchi, adequate airflow noted Cardiac exam with regular rate and rhythm Lower extremities with mild edema, no cyanosis Alert and oriented, moves all 4 extremities        Assessment & Plan:

## 2014-06-28 NOTE — Assessment & Plan Note (Signed)
The pt appears to be stable from a pulmonary standpoint.  He has no bronchospasm on exam today, and feels his exertional tolerance is back to baseline after a bad week last week.  I have asked him to stay in pulmonary rehab, and to work on weight loss.

## 2014-06-28 NOTE — Patient Instructions (Signed)
No change in medications Stay as active as possible, and keep working on weight loss followup with me again in 72mos.

## 2014-06-30 ENCOUNTER — Encounter (HOSPITAL_COMMUNITY)
Admission: RE | Admit: 2014-06-30 | Discharge: 2014-06-30 | Disposition: A | Discharge status: Home / Self Care | Payer: Self-pay | Source: Ambulatory Visit | Attending: Cardiovascular Disease | Admitting: Cardiovascular Disease

## 2014-07-02 ENCOUNTER — Encounter (HOSPITAL_COMMUNITY)
Admission: RE | Admit: 2014-07-02 | Discharge: 2014-07-02 | Disposition: A | Discharge status: Home / Self Care | Payer: Self-pay | Source: Ambulatory Visit | Attending: Cardiovascular Disease | Admitting: Cardiovascular Disease

## 2014-07-05 ENCOUNTER — Encounter (HOSPITAL_COMMUNITY): Payer: Self-pay

## 2014-07-07 ENCOUNTER — Encounter (HOSPITAL_COMMUNITY)
Admission: RE | Admit: 2014-07-07 | Discharge: 2014-07-07 | Disposition: A | Discharge status: Home / Self Care | Payer: Self-pay | Source: Ambulatory Visit | Attending: Cardiovascular Disease | Admitting: Cardiovascular Disease

## 2014-07-08 ENCOUNTER — Ambulatory Visit (INDEPENDENT_AMBULATORY_CARE_PROVIDER_SITE_OTHER): Payer: Medicare Other | Admitting: Physician Assistant

## 2014-07-08 ENCOUNTER — Encounter (INDEPENDENT_AMBULATORY_CARE_PROVIDER_SITE_OTHER): Payer: Self-pay

## 2014-07-08 VITALS — BP 130/78 | HR 76 | Temp 97.4°F | Ht 63.0 in | Wt 220.0 lb

## 2014-07-08 DIAGNOSIS — Z4651 Encounter for fitting and adjustment of gastric lap band: Secondary | ICD-10-CM | POA: Diagnosis not present

## 2014-07-08 NOTE — Progress Notes (Signed)
  HISTORY: [name]. is a 73 y.o.male who received an AP-Large lap-band in June 2012 by Dr. Lucia Gaskins. The patient has gained 4 lbs since their last visit in April, and has lost 24.8 lbs since surgery. He comes in with complaints of increased hunger, especially in the evening. He reports no regurgitation or reflux symptoms. He exercises three times a week as part of his cardiac rehab maintenance. He is working to decrease the carbohydrates in his diet but he admits this is still very difficult for him. He thinks he can do better if he can cut out the evening snacking, which may be more boredom eating.  VITAL SIGNS: Filed Vitals:   07/08/14 1337  BP: 130/78  Pulse: 76  Temp: 97.4 F (36.3 C)    PHYSICAL EXAM: Physical exam reveals a very well-appearing 73 y.o.male in no apparent distress Neurologic: Awake, alert, oriented Psych: Bright affect, conversant Respiratory: Breathing even and unlabored. No stridor or wheezing Abdomen: Soft, nontender, nondistended to palpation. Incisions well-healed. No incisional hernias. Port easily palpated. Extremities: Atraumatic, good range of motion.  ASSESMENT: 73 y.o.  male  s/p AP-Large lap-band.   PLAN: The patient's port was accessed with a 20G Huber needle without difficulty. Clear fluid was aspirated and 0.25 mL saline was added to the port to give a total predicted volume of 9.25 mL. The patient was able to swallow water without difficulty following the procedure and was instructed to take clear liquids for the next 24-48 hours and advance slowly as tolerated. I encouraged him to avoid evening snacking, especially things like saltine crackers which is a weakness of his, he says. We'll have him return in three months unless he needs to be seen sooner.

## 2014-07-08 NOTE — Patient Instructions (Signed)

## 2014-07-09 ENCOUNTER — Encounter (HOSPITAL_COMMUNITY): Payer: Self-pay

## 2014-07-10 ENCOUNTER — Other Ambulatory Visit: Payer: Self-pay | Admitting: Endocrinology

## 2014-07-10 ENCOUNTER — Other Ambulatory Visit: Payer: Self-pay | Admitting: Physician Assistant

## 2014-07-12 ENCOUNTER — Encounter (HOSPITAL_COMMUNITY)
Admission: RE | Admit: 2014-07-12 | Discharge: 2014-07-12 | Disposition: A | Discharge status: Home / Self Care | Payer: Self-pay | Source: Ambulatory Visit | Attending: Cardiovascular Disease | Admitting: Cardiovascular Disease

## 2014-07-12 ENCOUNTER — Encounter: Payer: Self-pay | Admitting: Gastroenterology

## 2014-07-12 ENCOUNTER — Telehealth: Payer: Self-pay | Admitting: Endocrinology

## 2014-07-12 NOTE — Telephone Encounter (Signed)
Pt needs hydrocodone rx please

## 2014-07-13 MED ORDER — HYDROCODONE-ACETAMINOPHEN 10-325 MG PO TABS: 1.0000 | ORAL_TABLET | Freq: Four times a day (QID) | ORAL | Status: DC | PRN

## 2014-07-13 NOTE — Telephone Encounter (Signed)
Pt is requesting a refill on his Hydrocodone. Pt was last seen on 05/10/2014 and medication was last filled on 06/08/2014.  Thanks!

## 2014-07-13 NOTE — Telephone Encounter (Signed)
i printed 

## 2014-07-13 NOTE — Telephone Encounter (Signed)
Pt advised that Rx is ready for pick up. Rx placed up front.  

## 2014-07-14 ENCOUNTER — Encounter (HOSPITAL_COMMUNITY)
Admission: RE | Admit: 2014-07-14 | Discharge: 2014-07-14 | Disposition: A | Discharge status: Home / Self Care | Payer: Self-pay | Source: Ambulatory Visit | Attending: Cardiovascular Disease | Admitting: Cardiovascular Disease

## 2014-07-16 ENCOUNTER — Encounter (HOSPITAL_COMMUNITY): Payer: Self-pay

## 2014-07-19 ENCOUNTER — Encounter (HOSPITAL_COMMUNITY): Payer: Self-pay

## 2014-07-21 ENCOUNTER — Encounter (HOSPITAL_COMMUNITY)
Admission: RE | Admit: 2014-07-21 | Discharge: 2014-07-21 | Disposition: A | Discharge status: Home / Self Care | Payer: Self-pay | Source: Ambulatory Visit | Attending: Cardiovascular Disease | Admitting: Cardiovascular Disease

## 2014-07-21 ENCOUNTER — Encounter: Payer: Self-pay | Admitting: *Deleted

## 2014-07-23 ENCOUNTER — Encounter (HOSPITAL_COMMUNITY)
Admission: RE | Admit: 2014-07-23 | Discharge: 2014-07-23 | Disposition: A | Discharge status: Home / Self Care | Payer: Self-pay | Source: Ambulatory Visit | Attending: Cardiovascular Disease | Admitting: Cardiovascular Disease

## 2014-07-24 ENCOUNTER — Other Ambulatory Visit: Payer: Self-pay | Admitting: Endocrinology

## 2014-07-26 ENCOUNTER — Encounter (HOSPITAL_COMMUNITY)
Admission: RE | Admit: 2014-07-26 | Discharge: 2014-07-26 | Disposition: A | Discharge status: Home / Self Care | Payer: Self-pay | Source: Ambulatory Visit | Attending: Cardiovascular Disease | Admitting: Cardiovascular Disease

## 2014-07-26 DIAGNOSIS — E785 Hyperlipidemia, unspecified: Secondary | ICD-10-CM | POA: Insufficient documentation

## 2014-07-26 DIAGNOSIS — I252 Old myocardial infarction: Secondary | ICD-10-CM | POA: Insufficient documentation

## 2014-07-26 DIAGNOSIS — I10 Essential (primary) hypertension: Secondary | ICD-10-CM | POA: Insufficient documentation

## 2014-07-26 DIAGNOSIS — I5042 Chronic combined systolic (congestive) and diastolic (congestive) heart failure: Secondary | ICD-10-CM | POA: Insufficient documentation

## 2014-07-26 DIAGNOSIS — I2589 Other forms of chronic ischemic heart disease: Secondary | ICD-10-CM | POA: Insufficient documentation

## 2014-07-26 DIAGNOSIS — I2510 Atherosclerotic heart disease of native coronary artery without angina pectoris: Secondary | ICD-10-CM | POA: Insufficient documentation

## 2014-07-26 DIAGNOSIS — Z5189 Encounter for other specified aftercare: Secondary | ICD-10-CM | POA: Insufficient documentation

## 2014-07-27 ENCOUNTER — Ambulatory Visit (INDEPENDENT_AMBULATORY_CARE_PROVIDER_SITE_OTHER): Payer: Medicare Other | Admitting: Gastroenterology

## 2014-07-27 ENCOUNTER — Encounter: Payer: Self-pay | Admitting: Gastroenterology

## 2014-07-27 VITALS — BP 120/60 | HR 72 | Ht 63.0 in | Wt 216.6 lb

## 2014-07-27 DIAGNOSIS — I2510 Atherosclerotic heart disease of native coronary artery without angina pectoris: Secondary | ICD-10-CM

## 2014-07-27 DIAGNOSIS — Z800 Family history of malignant neoplasm of digestive organs: Secondary | ICD-10-CM | POA: Diagnosis not present

## 2014-07-27 DIAGNOSIS — Z1211 Encounter for screening for malignant neoplasm of colon: Secondary | ICD-10-CM | POA: Diagnosis not present
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
## 2014-07-27 NOTE — Patient Instructions (Addendum)
You will be contacted by [facility] in regards to your Cologuard Stool Test, they will need to get mailing instructions.  Once your test is completed you will hear from [place] in regards to your results.

## 2014-07-28 ENCOUNTER — Encounter (HOSPITAL_COMMUNITY): Payer: Self-pay

## 2014-07-29 NOTE — Progress Notes (Signed)
Reviewed and agree with management. Marita Burnsed D. Jassiah Viviano, M.D., FACG  

## 2014-07-30 ENCOUNTER — Encounter: Payer: Self-pay | Admitting: Endocrinology

## 2014-07-30 ENCOUNTER — Encounter (HOSPITAL_COMMUNITY): Payer: Self-pay

## 2014-08-01 DIAGNOSIS — Z1211 Encounter for screening for malignant neoplasm of colon: Secondary | ICD-10-CM | POA: Diagnosis not present

## 2014-08-02 ENCOUNTER — Encounter (HOSPITAL_COMMUNITY)
Admission: RE | Admit: 2014-08-02 | Discharge: 2014-08-02 | Disposition: A | Discharge status: Home / Self Care | Payer: Self-pay | Source: Ambulatory Visit | Attending: Cardiovascular Disease | Admitting: Cardiovascular Disease

## 2014-08-02 LAB — COLOGUARD: COLOGUARD: NEGATIVE

## 2014-08-04 ENCOUNTER — Encounter (HOSPITAL_COMMUNITY): Payer: Self-pay

## 2014-08-06 ENCOUNTER — Encounter (HOSPITAL_COMMUNITY)
Admission: RE | Admit: 2014-08-06 | Discharge: 2014-08-06 | Disposition: A | Discharge status: Home / Self Care | Payer: Self-pay | Source: Ambulatory Visit | Attending: Cardiovascular Disease | Admitting: Cardiovascular Disease

## 2014-08-06 ENCOUNTER — Other Ambulatory Visit: Payer: Self-pay

## 2014-08-06 ENCOUNTER — Encounter: Payer: Self-pay | Admitting: Endocrinology

## 2014-08-06 ENCOUNTER — Ambulatory Visit (INDEPENDENT_AMBULATORY_CARE_PROVIDER_SITE_OTHER): Payer: Medicare Other | Admitting: Endocrinology

## 2014-08-06 VITALS — BP 120/56 | HR 70 | Temp 98.3°F | Ht 63.0 in | Wt 217.0 lb

## 2014-08-06 DIAGNOSIS — E1029 Type 1 diabetes mellitus with other diabetic kidney complication: Secondary | ICD-10-CM

## 2014-08-06 DIAGNOSIS — I2510 Atherosclerotic heart disease of native coronary artery without angina pectoris: Secondary | ICD-10-CM

## 2014-08-06 LAB — HEMOGLOBIN A1C: HEMOGLOBIN A1C: 6.8 % — AB (ref 4.6–6.5)

## 2014-08-06 MED ORDER — HYDROCODONE-ACETAMINOPHEN 10-325 MG PO TABS: 1.0000 | ORAL_TABLET | Freq: Four times a day (QID) | ORAL | Status: DC | PRN
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
## 2014-08-06 NOTE — Patient Instructions (Signed)
blood tests are being requested for you today.  We'll contact you with results.    check your blood sugar twice a day.  vary the time of day when you check, between before the 3 meals, and at bedtime.  also check if you have symptoms of your blood sugar being too high or too low.  please keep a record of the readings and bring it to your next appointment here.  You can write it on any piece of paper.  please call us sooner if your blood sugar goes below 70, or if you have a lot of readings over 200.   Please come back for a follow-up appointment in 3 months.    

## 2014-08-07 ENCOUNTER — Other Ambulatory Visit: Payer: Self-pay | Admitting: Endocrinology

## 2014-08-09 ENCOUNTER — Other Ambulatory Visit: Payer: Self-pay | Admitting: Cardiovascular Disease

## 2014-08-09 ENCOUNTER — Encounter (HOSPITAL_COMMUNITY): Payer: Self-pay

## 2014-08-10 ENCOUNTER — Telehealth: Payer: Self-pay | Admitting: *Deleted

## 2014-08-10 NOTE — Telephone Encounter (Signed)
CALLED PATIENT AND SPOKE WITH WIFE, WE HAVE PERMISSION TO SPEAK TO HER. ADVISED MR. Stillson COLOGUARD TEST WAS NEGATIVE. WIFE VERBALIZED UNDERSTANDING

## 2014-08-11 ENCOUNTER — Encounter (HOSPITAL_COMMUNITY): Payer: Self-pay

## 2014-08-13 ENCOUNTER — Encounter (HOSPITAL_COMMUNITY): Payer: Self-pay

## 2014-08-16 ENCOUNTER — Encounter (HOSPITAL_COMMUNITY): Payer: Self-pay

## 2014-08-17 ENCOUNTER — Encounter: Payer: Self-pay | Admitting: Gastroenterology

## 2014-08-18 ENCOUNTER — Encounter (HOSPITAL_COMMUNITY)
Admission: RE | Admit: 2014-08-18 | Discharge: 2014-08-18 | Disposition: A | Discharge status: Home / Self Care | Payer: Self-pay | Source: Ambulatory Visit | Attending: Cardiovascular Disease | Admitting: Cardiovascular Disease

## 2014-08-20 ENCOUNTER — Encounter (HOSPITAL_COMMUNITY)
Admission: RE | Admit: 2014-08-20 | Discharge: 2014-08-20 | Disposition: A | Discharge status: Home / Self Care | Payer: Self-pay | Source: Ambulatory Visit | Attending: Cardiovascular Disease | Admitting: Cardiovascular Disease

## 2014-08-22 ENCOUNTER — Other Ambulatory Visit: Payer: Self-pay | Admitting: Endocrinology

## 2014-08-23 ENCOUNTER — Encounter (HOSPITAL_COMMUNITY)
Admission: RE | Admit: 2014-08-23 | Discharge: 2014-08-23 | Disposition: A | Discharge status: Home / Self Care | Payer: Self-pay | Source: Ambulatory Visit | Attending: Cardiovascular Disease | Admitting: Cardiovascular Disease

## 2014-08-30 DIAGNOSIS — I10 Essential (primary) hypertension: Secondary | ICD-10-CM | POA: Diagnosis not present

## 2014-08-30 DIAGNOSIS — I498 Other specified cardiac arrhythmias: Secondary | ICD-10-CM | POA: Diagnosis not present

## 2014-08-30 DIAGNOSIS — I5033 Acute on chronic diastolic (congestive) heart failure: Secondary | ICD-10-CM | POA: Diagnosis present

## 2014-08-30 DIAGNOSIS — J189 Pneumonia, unspecified organism: Secondary | ICD-10-CM | POA: Diagnosis not present

## 2014-08-30 DIAGNOSIS — R6889 Other general symptoms and signs: Secondary | ICD-10-CM | POA: Diagnosis not present

## 2014-08-30 DIAGNOSIS — Z86711 Personal history of pulmonary embolism: Secondary | ICD-10-CM | POA: Diagnosis not present

## 2014-08-30 DIAGNOSIS — R0609 Other forms of dyspnea: Secondary | ICD-10-CM | POA: Diagnosis not present

## 2014-08-30 DIAGNOSIS — Z79899 Other long term (current) drug therapy: Secondary | ICD-10-CM | POA: Diagnosis not present

## 2014-08-30 DIAGNOSIS — I509 Heart failure, unspecified: Secondary | ICD-10-CM | POA: Diagnosis not present

## 2014-08-30 DIAGNOSIS — E785 Hyperlipidemia, unspecified: Secondary | ICD-10-CM | POA: Diagnosis not present

## 2014-08-30 DIAGNOSIS — J449 Chronic obstructive pulmonary disease, unspecified: Secondary | ICD-10-CM | POA: Diagnosis not present

## 2014-08-30 DIAGNOSIS — I428 Other cardiomyopathies: Secondary | ICD-10-CM | POA: Diagnosis present

## 2014-08-30 DIAGNOSIS — Z9981 Dependence on supplemental oxygen: Secondary | ICD-10-CM | POA: Diagnosis not present

## 2014-08-30 DIAGNOSIS — N189 Chronic kidney disease, unspecified: Secondary | ICD-10-CM | POA: Diagnosis not present

## 2014-08-30 DIAGNOSIS — Z7982 Long term (current) use of aspirin: Secondary | ICD-10-CM | POA: Diagnosis not present

## 2014-08-30 DIAGNOSIS — R079 Chest pain, unspecified: Secondary | ICD-10-CM | POA: Diagnosis not present

## 2014-08-30 DIAGNOSIS — IMO0002 Reserved for concepts with insufficient information to code with codable children: Secondary | ICD-10-CM | POA: Diagnosis not present

## 2014-08-30 DIAGNOSIS — I129 Hypertensive chronic kidney disease with stage 1 through stage 4 chronic kidney disease, or unspecified chronic kidney disease: Secondary | ICD-10-CM | POA: Diagnosis not present

## 2014-08-30 DIAGNOSIS — R0602 Shortness of breath: Secondary | ICD-10-CM | POA: Diagnosis not present

## 2014-08-30 DIAGNOSIS — Z7902 Long term (current) use of antithrombotics/antiplatelets: Secondary | ICD-10-CM | POA: Diagnosis not present

## 2014-08-30 DIAGNOSIS — E669 Obesity, unspecified: Secondary | ICD-10-CM | POA: Diagnosis present

## 2014-08-30 DIAGNOSIS — J168 Pneumonia due to other specified infectious organisms: Secondary | ICD-10-CM | POA: Diagnosis not present

## 2014-08-30 DIAGNOSIS — J4489 Other specified chronic obstructive pulmonary disease: Secondary | ICD-10-CM | POA: Diagnosis not present

## 2014-08-30 DIAGNOSIS — I517 Cardiomegaly: Secondary | ICD-10-CM | POA: Diagnosis not present

## 2014-08-30 DIAGNOSIS — E119 Type 2 diabetes mellitus without complications: Secondary | ICD-10-CM | POA: Diagnosis not present

## 2014-08-30 DIAGNOSIS — R7989 Other specified abnormal findings of blood chemistry: Secondary | ICD-10-CM | POA: Diagnosis not present

## 2014-08-30 DIAGNOSIS — J181 Lobar pneumonia, unspecified organism: Secondary | ICD-10-CM

## 2014-08-30 DIAGNOSIS — Z8673 Personal history of transient ischemic attack (TIA), and cerebral infarction without residual deficits: Secondary | ICD-10-CM | POA: Diagnosis not present

## 2014-08-30 DIAGNOSIS — I2510 Atherosclerotic heart disease of native coronary artery without angina pectoris: Secondary | ICD-10-CM | POA: Diagnosis not present

## 2014-08-30 DIAGNOSIS — E6609 Other obesity due to excess calories: Secondary | ICD-10-CM | POA: Insufficient documentation

## 2014-08-30 DIAGNOSIS — R062 Wheezing: Secondary | ICD-10-CM | POA: Diagnosis not present

## 2014-08-30 DIAGNOSIS — I252 Old myocardial infarction: Secondary | ICD-10-CM | POA: Diagnosis not present

## 2014-08-30 DIAGNOSIS — Z85828 Personal history of other malignant neoplasm of skin: Secondary | ICD-10-CM | POA: Diagnosis not present

## 2014-08-30 DIAGNOSIS — R799 Abnormal finding of blood chemistry, unspecified: Secondary | ICD-10-CM | POA: Diagnosis present

## 2014-08-30 DIAGNOSIS — R748 Abnormal levels of other serum enzymes: Secondary | ICD-10-CM | POA: Diagnosis not present

## 2014-08-30 DIAGNOSIS — Z9884 Bariatric surgery status: Secondary | ICD-10-CM | POA: Diagnosis not present

## 2014-09-01 ENCOUNTER — Encounter (HOSPITAL_COMMUNITY): Payer: Self-pay

## 2014-09-01 DIAGNOSIS — J438 Other emphysema: Secondary | ICD-10-CM | POA: Insufficient documentation

## 2014-09-01 DIAGNOSIS — J9610 Chronic respiratory failure, unspecified whether with hypoxia or hypercapnia: Secondary | ICD-10-CM | POA: Insufficient documentation

## 2014-09-03 ENCOUNTER — Encounter (HOSPITAL_COMMUNITY): Payer: Self-pay

## 2014-09-06 ENCOUNTER — Encounter (HOSPITAL_COMMUNITY): Payer: Self-pay

## 2014-09-08 ENCOUNTER — Encounter (HOSPITAL_COMMUNITY): Payer: Self-pay

## 2014-09-09 ENCOUNTER — Other Ambulatory Visit: Payer: Self-pay | Admitting: Endocrinology

## 2014-09-10 ENCOUNTER — Encounter (HOSPITAL_COMMUNITY): Payer: Self-pay

## 2014-09-10 ENCOUNTER — Encounter: Payer: Self-pay | Admitting: Endocrinology

## 2014-09-10 ENCOUNTER — Ambulatory Visit (INDEPENDENT_AMBULATORY_CARE_PROVIDER_SITE_OTHER): Payer: Medicare Other | Admitting: Endocrinology

## 2014-09-10 ENCOUNTER — Ambulatory Visit
Admission: RE | Admit: 2014-09-10 | Discharge: 2014-09-10 | Disposition: A | Discharge status: Home / Self Care | Payer: Medicare Other | Source: Ambulatory Visit | Attending: Endocrinology | Admitting: Endocrinology

## 2014-09-10 VITALS — BP 122/82 | HR 80 | Temp 97.4°F | Ht 63.0 in | Wt 214.0 lb

## 2014-09-10 DIAGNOSIS — J189 Pneumonia, unspecified organism: Secondary | ICD-10-CM | POA: Diagnosis not present

## 2014-09-10 DIAGNOSIS — I2510 Atherosclerotic heart disease of native coronary artery without angina pectoris: Secondary | ICD-10-CM

## 2014-09-10 MED ORDER — HYDROCODONE-ACETAMINOPHEN 10-325 MG PO TABS: 1.0000 | ORAL_TABLET | Freq: Four times a day (QID) | ORAL | Status: DC | PRN

## 2014-09-10 NOTE — Patient Instructions (Addendum)
Please see dr Gwenette Greet as scheduled.   A chest-x-ray is requested for you today.  We'll contact you with results. Please sign release of information for the hospital stay.    Please finish your antibiotics.
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
## 2014-09-13 ENCOUNTER — Encounter (HOSPITAL_COMMUNITY): Payer: Self-pay

## 2014-09-13 ENCOUNTER — Encounter: Payer: Self-pay | Admitting: Adult Health

## 2014-09-13 ENCOUNTER — Ambulatory Visit (INDEPENDENT_AMBULATORY_CARE_PROVIDER_SITE_OTHER): Payer: Medicare Other | Admitting: Adult Health

## 2014-09-13 VITALS — BP 140/74 | HR 74 | Temp 97.8°F | Ht 63.0 in | Wt 213.4 lb

## 2014-09-13 DIAGNOSIS — Z23 Encounter for immunization: Secondary | ICD-10-CM | POA: Diagnosis not present

## 2014-09-13 DIAGNOSIS — I2510 Atherosclerotic heart disease of native coronary artery without angina pectoris: Secondary | ICD-10-CM | POA: Diagnosis not present

## 2014-09-13 DIAGNOSIS — J189 Pneumonia, unspecified organism: Secondary | ICD-10-CM | POA: Diagnosis not present

## 2014-09-13 DIAGNOSIS — J438 Other emphysema: Secondary | ICD-10-CM | POA: Diagnosis not present

## 2014-09-13 DIAGNOSIS — J439 Emphysema, unspecified: Secondary | ICD-10-CM

## 2014-09-13 NOTE — Patient Instructions (Signed)
Continue on Symbicort and Spiriva .  Flu shot today  Continue on Oxygen 2lm At bedtime   Follow up Dr. Gwenette Greet in 6 weeks and As needed   Please contact office for sooner follow up if symptoms do not improve or worsen or seek emergency care

## 2014-09-13 NOTE — Assessment & Plan Note (Signed)
Compensated on present regimen  No flare on present regimen  Flu shot today

## 2014-09-13 NOTE — Assessment & Plan Note (Signed)
Resolved on cxr  Clinically improving  Cont on current regimen  follow up Dr. Gwenette Greet in 6 weeks and As needed

## 2014-09-13 NOTE — Progress Notes (Signed)
   Subjective:    Patient ID: [name] Alcon., male    DOB: [birth year], 73 y.o.   MRN: [id number]  HPI 73 yo male with known hx of COPD with chronic respiratory failure. Hx of ischemic cardiomyopathy  09/13/2014 [facility] follow up  Returns for a post hospital follow up . Was admitted to outside hospital while on vacation in [place]  2 weeks ago.  Discharge records reviewed with admission for [facility] . Started on IV abx and discharged on omnicef x 10 d  And o2 24/7 at 2l/m  Since discharge he is feeling better. Cough and dyspnea are improved.  CXR done on 9/18 showed chronic changes w/out acute process.  No desats today in office w/ O2 ~95% on RA at rest and walking.  He denies fever, chest pain, orthopnea or edema. No diarrhea or hemoptysis .  Good appetite .     Review of Systems Constitutional:   No  weight loss, night sweats,  Fevers, chills, + fatigue, or  lassitude.  HEENT:   No headaches,  Difficulty swallowing,  Tooth/dental problems, or  Sore throat,                No sneezing, itching, ear ache, + nasal congestion, post nasal drip,   CV:  No chest pain,  Orthopnea, PND, swelling in lower extremities, anasarca, dizziness, palpitations, syncope.   GI  No heartburn, indigestion, abdominal pain, nausea, vomiting, diarrhea, change in bowel habits, loss of appetite, bloody stools.   Resp:   No coughing up of blood.  No change in color of mucus.  No wheezing.  No chest wall deformity  Skin: no rash or lesions.  GU: no dysuria, change in color of urine, no urgency or frequency.  No flank pain, no hematuria   MS:  No joint pain or swelling.  No decreased range of motion.  No back pain.  Psych:  No change in mood or affect. No depression or anxiety.  No memory loss.          Objective:   Physical Exam GEN: A/Ox3; pleasant , NAD, elderly and overweight.   HEENT:  [place]/AT,  EACs-clear, TMs-wnl, NOSE-clear, THROAT-clear, no lesions, no postnasal drip or exudate noted.    NECK:  Supple w/ fair ROM; no JVD; normal carotid impulses w/o bruits; no thyromegaly or nodules palpated; no lymphadenopathy.  RESP  Diminished BS in bases no accessory muscle use, no dullness to percussion  CARD:  RRR, no m/r/g  , no peripheral edema, pulses intact, no cyanosis or clubbing.  GI:   Soft & nt; nml bowel sounds; no organomegaly or masses detected.  Musco: Warm bil, no deformities or joint swelling noted.   Neuro: alert, no focal deficits noted.    Skin: Warm, no lesions or rashes    CXR 09/10/14  Chronic blunting of the right costophrenic angle is stable.  There is stable prominence of the bronchovascular markings in both  lung bases, but no focal airspace disease.        Assessment & Plan:

## 2014-09-14 ENCOUNTER — Telehealth: Payer: Self-pay | Admitting: Cardiovascular Disease

## 2014-09-14 NOTE — Telephone Encounter (Signed)
°  Patient has questions regarding medication. Please call and advise.

## 2014-09-14 NOTE — Telephone Encounter (Signed)
That is ok with me. cdm

## 2014-09-14 NOTE — Telephone Encounter (Signed)
Spoke with pt who reports he was recently hospitalized in [place] with pneumonia. No problems from a heart stand point. He reports while hospitalized his bisoprolol was stopped and he was started on Carvedilol 12. 5 mg by mouth twice daily. Pt wants to make sure this change is OK with Dr. Angelena Form

## 2014-09-14 NOTE — Telephone Encounter (Signed)
Pt.notified

## 2014-09-15 ENCOUNTER — Encounter (HOSPITAL_COMMUNITY): Payer: Self-pay

## 2014-09-16 ENCOUNTER — Other Ambulatory Visit: Payer: Self-pay | Admitting: Endocrinology

## 2014-09-17 ENCOUNTER — Encounter (HOSPITAL_COMMUNITY): Payer: Self-pay

## 2014-09-20 ENCOUNTER — Encounter (HOSPITAL_COMMUNITY)
Admission: RE | Admit: 2014-09-20 | Discharge: 2014-09-20 | Disposition: A | Discharge status: Home / Self Care | Payer: Self-pay | Source: Ambulatory Visit | Attending: Cardiovascular Disease | Admitting: Cardiovascular Disease

## 2014-09-22 ENCOUNTER — Encounter (HOSPITAL_COMMUNITY)
Admission: RE | Admit: 2014-09-22 | Discharge: 2014-09-22 | Disposition: A | Discharge status: Home / Self Care | Payer: Self-pay | Source: Ambulatory Visit | Attending: Cardiovascular Disease | Admitting: Cardiovascular Disease

## 2014-09-23 ENCOUNTER — Other Ambulatory Visit: Payer: Self-pay | Admitting: Endocrinology

## 2014-09-24 ENCOUNTER — Encounter (HOSPITAL_COMMUNITY): Payer: Medicare Other

## 2014-09-24 ENCOUNTER — Other Ambulatory Visit: Payer: Self-pay | Admitting: Endocrinology

## 2014-09-24 DIAGNOSIS — I252 Old myocardial infarction: Secondary | ICD-10-CM | POA: Insufficient documentation

## 2014-09-24 DIAGNOSIS — I2510 Atherosclerotic heart disease of native coronary artery without angina pectoris: Secondary | ICD-10-CM | POA: Insufficient documentation

## 2014-09-24 DIAGNOSIS — Z5189 Encounter for other specified aftercare: Secondary | ICD-10-CM | POA: Insufficient documentation

## 2014-09-24 DIAGNOSIS — I259 Chronic ischemic heart disease, unspecified: Secondary | ICD-10-CM | POA: Insufficient documentation

## 2014-09-24 DIAGNOSIS — I10 Essential (primary) hypertension: Secondary | ICD-10-CM | POA: Insufficient documentation

## 2014-09-24 DIAGNOSIS — I5042 Chronic combined systolic (congestive) and diastolic (congestive) heart failure: Secondary | ICD-10-CM | POA: Insufficient documentation

## 2014-09-24 DIAGNOSIS — E785 Hyperlipidemia, unspecified: Secondary | ICD-10-CM | POA: Insufficient documentation

## 2014-09-24 NOTE — Progress Notes (Signed)
Ov reviewed, and agree with plan as outlined.  

## 2014-09-27 ENCOUNTER — Encounter (HOSPITAL_COMMUNITY)
Admission: RE | Admit: 2014-09-27 | Discharge: 2014-09-27 | Disposition: A | Discharge status: Home / Self Care | Payer: Self-pay | Source: Ambulatory Visit | Attending: Cardiovascular Disease | Admitting: Cardiovascular Disease

## 2014-09-28 ENCOUNTER — Other Ambulatory Visit: Payer: Self-pay | Admitting: Cardiovascular Disease

## 2014-09-28 ENCOUNTER — Other Ambulatory Visit: Payer: Self-pay | Admitting: Endocrinology

## 2014-09-29 ENCOUNTER — Encounter (HOSPITAL_COMMUNITY)
Admission: RE | Admit: 2014-09-29 | Discharge: 2014-09-29 | Disposition: A | Discharge status: Home / Self Care | Payer: Self-pay | Source: Ambulatory Visit | Attending: Cardiovascular Disease | Admitting: Cardiovascular Disease

## 2014-10-01 ENCOUNTER — Encounter (HOSPITAL_COMMUNITY): Payer: Self-pay

## 2014-10-01 ENCOUNTER — Other Ambulatory Visit: Payer: Self-pay | Admitting: Cardiovascular Disease

## 2014-10-04 ENCOUNTER — Encounter (HOSPITAL_COMMUNITY)
Admission: RE | Admit: 2014-10-04 | Discharge: 2014-10-04 | Disposition: A | Discharge status: Home / Self Care | Payer: Self-pay | Source: Ambulatory Visit | Attending: Cardiovascular Disease | Admitting: Cardiovascular Disease

## 2014-10-06 ENCOUNTER — Encounter (HOSPITAL_COMMUNITY)
Admission: RE | Admit: 2014-10-06 | Discharge: 2014-10-06 | Disposition: A | Discharge status: Home / Self Care | Payer: Self-pay | Source: Ambulatory Visit | Attending: Cardiovascular Disease | Admitting: Cardiovascular Disease

## 2014-10-07 ENCOUNTER — Telehealth: Payer: Self-pay | Admitting: Endocrinology

## 2014-10-07 ENCOUNTER — Encounter (INDEPENDENT_AMBULATORY_CARE_PROVIDER_SITE_OTHER): Payer: Medicare Other

## 2014-10-07 NOTE — Telephone Encounter (Signed)
Patient would like a refill on his pain medicine as he is going out of town    Thank you

## 2014-10-08 ENCOUNTER — Encounter (HOSPITAL_COMMUNITY)
Admission: RE | Admit: 2014-10-08 | Discharge: 2014-10-08 | Disposition: A | Discharge status: Home / Self Care | Payer: Self-pay | Source: Ambulatory Visit | Attending: Cardiovascular Disease | Admitting: Cardiovascular Disease

## 2014-10-08 MED ORDER — HYDROCODONE-ACETAMINOPHEN 10-325 MG PO TABS: 1.0000 | ORAL_TABLET | Freq: Four times a day (QID) | ORAL | Status: DC | PRN

## 2014-10-08 NOTE — Telephone Encounter (Signed)
Pt advised that rx is ready for pick up. Rx placed up front.  

## 2014-10-08 NOTE — Telephone Encounter (Signed)
See below, Thanks!  

## 2014-10-08 NOTE — Telephone Encounter (Signed)
i printed 

## 2014-10-11 ENCOUNTER — Encounter (HOSPITAL_COMMUNITY): Payer: Self-pay

## 2014-10-13 ENCOUNTER — Encounter (HOSPITAL_COMMUNITY): Payer: Self-pay

## 2014-10-15 ENCOUNTER — Encounter (HOSPITAL_COMMUNITY): Payer: Self-pay

## 2014-10-18 ENCOUNTER — Encounter (HOSPITAL_COMMUNITY): Payer: Self-pay

## 2014-10-19 DIAGNOSIS — E119 Type 2 diabetes mellitus without complications: Secondary | ICD-10-CM | POA: Diagnosis not present

## 2014-10-20 ENCOUNTER — Encounter (HOSPITAL_COMMUNITY)
Admission: RE | Admit: 2014-10-20 | Discharge: 2014-10-20 | Disposition: A | Discharge status: Home / Self Care | Payer: Self-pay | Source: Ambulatory Visit | Attending: Cardiovascular Disease | Admitting: Cardiovascular Disease

## 2014-10-21 ENCOUNTER — Encounter (INDEPENDENT_AMBULATORY_CARE_PROVIDER_SITE_OTHER): Payer: Medicare Other

## 2014-10-22 ENCOUNTER — Encounter (HOSPITAL_COMMUNITY)
Admission: RE | Admit: 2014-10-22 | Discharge: 2014-10-22 | Disposition: A | Discharge status: Home / Self Care | Payer: Self-pay | Source: Ambulatory Visit | Attending: Cardiovascular Disease | Admitting: Cardiovascular Disease

## 2014-10-23 ENCOUNTER — Other Ambulatory Visit: Payer: Self-pay | Admitting: Endocrinology

## 2014-10-25 ENCOUNTER — Other Ambulatory Visit: Payer: Self-pay | Admitting: Endocrinology

## 2014-10-25 ENCOUNTER — Encounter (HOSPITAL_COMMUNITY): Payer: Medicare Other

## 2014-10-25 DIAGNOSIS — I252 Old myocardial infarction: Secondary | ICD-10-CM | POA: Insufficient documentation

## 2014-10-25 DIAGNOSIS — I5042 Chronic combined systolic (congestive) and diastolic (congestive) heart failure: Secondary | ICD-10-CM | POA: Insufficient documentation

## 2014-10-25 DIAGNOSIS — I10 Essential (primary) hypertension: Secondary | ICD-10-CM | POA: Insufficient documentation

## 2014-10-25 DIAGNOSIS — Z5189 Encounter for other specified aftercare: Secondary | ICD-10-CM | POA: Insufficient documentation

## 2014-10-25 DIAGNOSIS — I2510 Atherosclerotic heart disease of native coronary artery without angina pectoris: Secondary | ICD-10-CM | POA: Insufficient documentation

## 2014-10-25 DIAGNOSIS — E785 Hyperlipidemia, unspecified: Secondary | ICD-10-CM | POA: Insufficient documentation

## 2014-10-26 ENCOUNTER — Ambulatory Visit: Payer: Medicare Other | Admitting: Pulmonary Disease

## 2014-10-27 ENCOUNTER — Encounter (HOSPITAL_COMMUNITY): Payer: Self-pay

## 2014-10-29 ENCOUNTER — Encounter (HOSPITAL_COMMUNITY)
Admission: RE | Admit: 2014-10-29 | Discharge: 2014-10-29 | Disposition: A | Discharge status: Home / Self Care | Payer: Self-pay | Source: Ambulatory Visit | Attending: Cardiovascular Disease | Admitting: Cardiovascular Disease

## 2014-11-01 ENCOUNTER — Encounter (HOSPITAL_COMMUNITY)
Admission: RE | Admit: 2014-11-01 | Discharge: 2014-11-01 | Disposition: A | Discharge status: Home / Self Care | Payer: Self-pay | Source: Ambulatory Visit | Attending: Cardiovascular Disease | Admitting: Cardiovascular Disease

## 2014-11-03 ENCOUNTER — Encounter (HOSPITAL_COMMUNITY): Payer: Self-pay

## 2014-11-05 ENCOUNTER — Encounter (HOSPITAL_COMMUNITY)
Admission: RE | Admit: 2014-11-05 | Discharge: 2014-11-05 | Disposition: A | Discharge status: Home / Self Care | Payer: Self-pay | Source: Ambulatory Visit | Attending: Cardiovascular Disease | Admitting: Cardiovascular Disease

## 2014-11-08 ENCOUNTER — Encounter (HOSPITAL_COMMUNITY)
Admission: RE | Admit: 2014-11-08 | Discharge: 2014-11-08 | Disposition: A | Discharge status: Home / Self Care | Payer: Self-pay | Source: Ambulatory Visit | Attending: Cardiovascular Disease | Admitting: Cardiovascular Disease

## 2014-11-08 ENCOUNTER — Encounter: Payer: Self-pay | Admitting: Endocrinology

## 2014-11-08 ENCOUNTER — Ambulatory Visit (INDEPENDENT_AMBULATORY_CARE_PROVIDER_SITE_OTHER): Payer: Medicare Other | Admitting: Endocrinology

## 2014-11-08 VITALS — BP 128/78 | HR 66 | Temp 98.3°F | Ht 63.0 in | Wt 211.0 lb

## 2014-11-08 DIAGNOSIS — N189 Chronic kidney disease, unspecified: Secondary | ICD-10-CM

## 2014-11-08 DIAGNOSIS — E1022 Type 1 diabetes mellitus with diabetic chronic kidney disease: Secondary | ICD-10-CM

## 2014-11-08 DIAGNOSIS — I2510 Atherosclerotic heart disease of native coronary artery without angina pectoris: Secondary | ICD-10-CM

## 2014-11-08 LAB — HEMOGLOBIN A1C: Hgb A1c MFr Bld: 6.6 % — ABNORMAL HIGH (ref 4.6–6.5)

## 2014-11-08 MED ORDER — HYDROCODONE-ACETAMINOPHEN 10-325 MG PO TABS
1.0000 | ORAL_TABLET | Freq: Four times a day (QID) | ORAL | Status: DC | PRN
Start: 2014-11-08 — End: 2014-12-22
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
## 2014-11-08 NOTE — Patient Instructions (Addendum)
blood tests are being requested for you today.  We'll contact you with results.   check your blood sugar twice a day.  vary the time of day when you check, between before the 3 meals, and at bedtime.  also check if you have symptoms of your blood sugar being too high or too low.  please keep a record of the readings and bring it to your next appointment here.  You can write it on any piece of paper.  please call us sooner if your blood sugar goes below 70, or if you have a lot of readings over 200. Please come back for a follow-up appointment in 3 months.  Please continue to work with your surgery doctors about the "lap-band."

## 2014-11-09 ENCOUNTER — Ambulatory Visit (INDEPENDENT_AMBULATORY_CARE_PROVIDER_SITE_OTHER): Payer: Medicare Other | Admitting: Pulmonary Disease

## 2014-11-09 ENCOUNTER — Encounter: Payer: Self-pay | Admitting: Pulmonary Disease

## 2014-11-09 VITALS — BP 132/76 | HR 63 | Temp 97.8°F | Ht 63.0 in | Wt 215.0 lb

## 2014-11-09 DIAGNOSIS — J438 Other emphysema: Secondary | ICD-10-CM

## 2014-11-09 DIAGNOSIS — I2510 Atherosclerotic heart disease of native coronary artery without angina pectoris: Secondary | ICD-10-CM

## 2014-11-09 LAB — BASIC METABOLIC PANEL
BUN: 34 mg/dL — AB (ref 6–23)
CO2: 29 mEq/L (ref 19–32)
CREATININE: 1.5 mg/dL (ref 0.4–1.5)
Calcium: 8.8 mg/dL (ref 8.4–10.5)
Chloride: 98 mEq/L (ref 96–112)
GFR: 50.37 mL/min — ABNORMAL LOW (ref >60.00)
Glucose, Bld: 203 mg/dL — ABNORMAL HIGH (ref 70–99)
Potassium: 4.4 mEq/L (ref 3.5–5.1)
Sodium: 132 mEq/L — ABNORMAL LOW (ref 135–145)

## 2014-11-09 NOTE — Progress Notes (Signed)
   Subjective:    Patient ID: [name] Alcon., male    DOB: 1941/11/12, 73 y.o.   MRN: [id number]  HPI The patient comes in today for follow-up of his known COPD. He is currently doing well, and feels that he is at his usual baseline. He did have an episode of pneumonia in September while traveling visiting family in [place], but his follow-up chest x-ray showed complete clearing. He does have both good and bad days, but overall he feels he is doing better. He is having no issues with his medications.   Review of Systems  Constitutional: Negative for fever and unexpected weight change.  HENT: Negative for congestion, dental problem, ear pain, nosebleeds, postnasal drip, rhinorrhea, sinus pressure, sneezing, sore throat and trouble swallowing.   Eyes: Negative for redness and itching.  Respiratory: Positive for shortness of breath. Negative for cough, chest tightness and wheezing.   Cardiovascular: Negative for palpitations and leg swelling.  Gastrointestinal: Negative for nausea and vomiting.  Genitourinary: Negative for dysuria.  Musculoskeletal: Negative for joint swelling.  Skin: Negative for rash.  Neurological: Negative for headaches.  Hematological: Does not bruise/bleed easily.  Psychiatric/Behavioral: Negative for dysphoric mood. The patient is not nervous/anxious.        Objective:   Physical Exam Obese male in no acute distress Nose without purulence or discharge noted Neck without lymphadenopathy or thyromegaly Chest with decreased breath sounds, no active wheezing or crackles Cardiac exam with regular rate and rhythm Lower extremities with 1+ edema, no cyanosis Alert and oriented, moves all 4 extremities.       Assessment & Plan:

## 2014-11-09 NOTE — Assessment & Plan Note (Signed)
The patient is doing very well from a pulmonary standpoint, and although he had an episode of pneumonia while traveling in September, he is back to his usual baseline. He is having no issues with his medication, and I've asked him to continue this. I've also stressed to him the importance of weight loss and conditioning.

## 2014-11-09 NOTE — Patient Instructions (Signed)
No change in breathing meds Continue to stay active, and keep working on weight reduction I reviewed your chest xray from September, and looks fine.  followup with me again in 20mos, and can cancel apptm already scheduled for early next year.

## 2014-11-10 ENCOUNTER — Encounter (HOSPITAL_COMMUNITY)
Admission: RE | Admit: 2014-11-10 | Discharge: 2014-11-10 | Disposition: A | Discharge status: Home / Self Care | Payer: Self-pay | Source: Ambulatory Visit | Attending: Cardiovascular Disease | Admitting: Cardiovascular Disease

## 2014-11-12 ENCOUNTER — Encounter (HOSPITAL_COMMUNITY)
Admission: RE | Admit: 2014-11-12 | Discharge: 2014-11-12 | Disposition: A | Discharge status: Home / Self Care | Payer: Self-pay | Source: Ambulatory Visit | Attending: Cardiovascular Disease | Admitting: Cardiovascular Disease

## 2014-11-15 ENCOUNTER — Encounter (HOSPITAL_COMMUNITY)
Admission: RE | Admit: 2014-11-15 | Discharge: 2014-11-15 | Disposition: A | Discharge status: Home / Self Care | Payer: Self-pay | Source: Ambulatory Visit | Attending: Cardiovascular Disease | Admitting: Cardiovascular Disease

## 2014-11-15 ENCOUNTER — Other Ambulatory Visit: Payer: Self-pay | Admitting: Endocrinology

## 2014-11-17 ENCOUNTER — Encounter (HOSPITAL_COMMUNITY)
Admission: RE | Admit: 2014-11-17 | Discharge: 2014-11-17 | Disposition: A | Discharge status: Home / Self Care | Payer: Self-pay | Source: Ambulatory Visit | Attending: Cardiovascular Disease | Admitting: Cardiovascular Disease

## 2014-11-22 ENCOUNTER — Encounter (HOSPITAL_COMMUNITY): Payer: Self-pay

## 2014-11-24 ENCOUNTER — Other Ambulatory Visit: Payer: Self-pay | Admitting: Endocrinology

## 2014-11-24 ENCOUNTER — Encounter (HOSPITAL_COMMUNITY)
Admission: RE | Admit: 2014-11-24 | Discharge: 2014-11-24 | Disposition: A | Discharge status: Home / Self Care | Payer: Self-pay | Source: Ambulatory Visit | Attending: Cardiovascular Disease | Admitting: Cardiovascular Disease

## 2014-11-24 DIAGNOSIS — I252 Old myocardial infarction: Secondary | ICD-10-CM | POA: Insufficient documentation

## 2014-11-24 DIAGNOSIS — I2510 Atherosclerotic heart disease of native coronary artery without angina pectoris: Secondary | ICD-10-CM | POA: Insufficient documentation

## 2014-11-24 DIAGNOSIS — I5042 Chronic combined systolic (congestive) and diastolic (congestive) heart failure: Secondary | ICD-10-CM | POA: Insufficient documentation

## 2014-11-24 DIAGNOSIS — E785 Hyperlipidemia, unspecified: Secondary | ICD-10-CM | POA: Insufficient documentation

## 2014-11-24 DIAGNOSIS — Z5189 Encounter for other specified aftercare: Secondary | ICD-10-CM | POA: Insufficient documentation

## 2014-11-24 DIAGNOSIS — I10 Essential (primary) hypertension: Secondary | ICD-10-CM | POA: Insufficient documentation

## 2014-11-26 ENCOUNTER — Encounter: Payer: Self-pay | Admitting: Cardiovascular Disease

## 2014-11-26 ENCOUNTER — Encounter (HOSPITAL_COMMUNITY)
Admission: RE | Admit: 2014-11-26 | Discharge: 2014-11-26 | Disposition: A | Discharge status: Home / Self Care | Payer: Self-pay | Source: Ambulatory Visit | Attending: Cardiovascular Disease | Admitting: Cardiovascular Disease

## 2014-11-26 ENCOUNTER — Ambulatory Visit (INDEPENDENT_AMBULATORY_CARE_PROVIDER_SITE_OTHER): Payer: Medicare Other | Admitting: Cardiovascular Disease

## 2014-11-26 VITALS — BP 106/60 | HR 70 | Wt 212.0 lb

## 2014-11-26 DIAGNOSIS — I5042 Chronic combined systolic (congestive) and diastolic (congestive) heart failure: Secondary | ICD-10-CM | POA: Diagnosis not present

## 2014-11-26 DIAGNOSIS — I10 Essential (primary) hypertension: Secondary | ICD-10-CM

## 2014-11-26 DIAGNOSIS — I255 Ischemic cardiomyopathy: Secondary | ICD-10-CM | POA: Diagnosis not present

## 2014-11-26 DIAGNOSIS — E785 Hyperlipidemia, unspecified: Secondary | ICD-10-CM | POA: Diagnosis not present

## 2014-11-26 DIAGNOSIS — I2510 Atherosclerotic heart disease of native coronary artery without angina pectoris: Secondary | ICD-10-CM | POA: Diagnosis not present

## 2014-11-26 MED ORDER — CARVEDILOL 12.5 MG PO TABS: 12.5000 mg | ORAL_TABLET | Freq: Two times a day (BID) | ORAL | Status: DC
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
## 2014-11-26 NOTE — Patient Instructions (Signed)
Your physician wants you to follow-up in:  6 months. You will receive a reminder letter in the mail two months in advance. If you don't receive a letter, please call our office to schedule the follow-up appointment.   

## 2014-11-29 ENCOUNTER — Encounter (HOSPITAL_COMMUNITY)
Admission: RE | Admit: 2014-11-29 | Discharge: 2014-11-29 | Disposition: A | Discharge status: Home / Self Care | Payer: Self-pay | Source: Ambulatory Visit | Attending: Cardiovascular Disease | Admitting: Cardiovascular Disease

## 2014-12-01 ENCOUNTER — Encounter (HOSPITAL_COMMUNITY)
Admission: RE | Admit: 2014-12-01 | Discharge: 2014-12-01 | Disposition: A | Discharge status: Home / Self Care | Payer: Self-pay | Source: Ambulatory Visit | Attending: Cardiovascular Disease | Admitting: Cardiovascular Disease

## 2014-12-02 ENCOUNTER — Encounter (HOSPITAL_COMMUNITY): Payer: Self-pay | Admitting: Cardiovascular Disease

## 2014-12-03 ENCOUNTER — Encounter (HOSPITAL_COMMUNITY): Payer: Self-pay

## 2014-12-06 ENCOUNTER — Encounter (HOSPITAL_COMMUNITY)
Admission: RE | Admit: 2014-12-06 | Discharge: 2014-12-06 | Disposition: A | Discharge status: Home / Self Care | Payer: Self-pay | Source: Ambulatory Visit | Attending: Cardiovascular Disease | Admitting: Cardiovascular Disease

## 2014-12-07 ENCOUNTER — Other Ambulatory Visit: Payer: Self-pay | Admitting: *Deleted

## 2014-12-07 MED ORDER — FUROSEMIDE 40 MG PO TABS: ORAL_TABLET | ORAL | Status: DC

## 2014-12-08 ENCOUNTER — Encounter (HOSPITAL_COMMUNITY)
Admission: RE | Admit: 2014-12-08 | Discharge: 2014-12-08 | Disposition: A | Discharge status: Home / Self Care | Payer: Self-pay | Source: Ambulatory Visit | Attending: Cardiovascular Disease | Admitting: Cardiovascular Disease

## 2014-12-10 ENCOUNTER — Encounter (HOSPITAL_COMMUNITY)
Admission: RE | Admit: 2014-12-10 | Discharge: 2014-12-10 | Disposition: A | Discharge status: Home / Self Care | Payer: Self-pay | Source: Ambulatory Visit | Attending: Cardiovascular Disease | Admitting: Cardiovascular Disease

## 2014-12-13 ENCOUNTER — Encounter (HOSPITAL_COMMUNITY)
Admission: RE | Admit: 2014-12-13 | Discharge: 2014-12-13 | Disposition: A | Discharge status: Home / Self Care | Payer: Self-pay | Source: Ambulatory Visit | Attending: Cardiovascular Disease | Admitting: Cardiovascular Disease

## 2014-12-15 ENCOUNTER — Encounter (HOSPITAL_COMMUNITY)
Admission: RE | Admit: 2014-12-15 | Discharge: 2014-12-15 | Disposition: A | Discharge status: Home / Self Care | Payer: Self-pay | Source: Ambulatory Visit | Attending: Cardiovascular Disease | Admitting: Cardiovascular Disease

## 2014-12-20 ENCOUNTER — Other Ambulatory Visit: Payer: Self-pay | Admitting: Cardiovascular Disease

## 2014-12-20 ENCOUNTER — Other Ambulatory Visit: Payer: Self-pay | Admitting: Endocrinology

## 2014-12-20 ENCOUNTER — Encounter (HOSPITAL_COMMUNITY)
Admission: RE | Admit: 2014-12-20 | Discharge: 2014-12-20 | Disposition: A | Discharge status: Home / Self Care | Payer: Self-pay | Source: Ambulatory Visit | Attending: Cardiovascular Disease | Admitting: Cardiovascular Disease

## 2014-12-21 NOTE — Telephone Encounter (Signed)
Patient need refill of Hydrocodone 10-325 mg

## 2014-12-21 NOTE — Telephone Encounter (Signed)
Please read note below and advise.  

## 2014-12-22 ENCOUNTER — Telehealth: Payer: Self-pay | Admitting: Endocrinology

## 2014-12-22 ENCOUNTER — Encounter (HOSPITAL_COMMUNITY)
Admission: RE | Admit: 2014-12-22 | Discharge: 2014-12-22 | Disposition: A | Discharge status: Home / Self Care | Payer: Self-pay | Source: Ambulatory Visit | Attending: Cardiovascular Disease | Admitting: Cardiovascular Disease

## 2014-12-22 ENCOUNTER — Other Ambulatory Visit: Payer: Self-pay | Admitting: *Deleted

## 2014-12-22 MED ORDER — HYDROCODONE-ACETAMINOPHEN 10-325 MG PO TABS: 1.0000 | ORAL_TABLET | Freq: Four times a day (QID) | ORAL | Status: DC | PRN

## 2014-12-22 NOTE — Telephone Encounter (Signed)
Noted, patient aware, rx ready for pick up

## 2014-12-22 NOTE — Telephone Encounter (Signed)
Give 30 day supply, 120 tabs

## 2014-12-22 NOTE — Telephone Encounter (Signed)
Please see below, last filled on 11/08/14

## 2014-12-22 NOTE — Telephone Encounter (Signed)
Pt would like the hydrocodone rx refilled please

## 2014-12-22 NOTE — Telephone Encounter (Signed)
Patient said he's been taking hydrocodone for 5-6 years for pain in his hip and leg, he takes 1 every 4 hours.

## 2014-12-22 NOTE — Telephone Encounter (Signed)
What is the pain medication for and how many does he take in a day

## 2014-12-23 ENCOUNTER — Other Ambulatory Visit: Payer: Self-pay | Admitting: Cardiovascular Disease

## 2014-12-26 ENCOUNTER — Other Ambulatory Visit: Payer: Self-pay | Admitting: Endocrinology

## 2014-12-27 ENCOUNTER — Encounter (HOSPITAL_COMMUNITY)
Admission: RE | Admit: 2014-12-27 | Discharge: 2014-12-27 | Disposition: A | Discharge status: Home / Self Care | Payer: Self-pay | Source: Ambulatory Visit | Attending: Cardiovascular Disease | Admitting: Cardiovascular Disease

## 2014-12-27 DIAGNOSIS — I5042 Chronic combined systolic (congestive) and diastolic (congestive) heart failure: Secondary | ICD-10-CM | POA: Insufficient documentation

## 2014-12-27 DIAGNOSIS — I252 Old myocardial infarction: Secondary | ICD-10-CM | POA: Insufficient documentation

## 2014-12-27 DIAGNOSIS — E785 Hyperlipidemia, unspecified: Secondary | ICD-10-CM | POA: Insufficient documentation

## 2014-12-27 DIAGNOSIS — Z5189 Encounter for other specified aftercare: Secondary | ICD-10-CM | POA: Insufficient documentation

## 2014-12-27 DIAGNOSIS — I2510 Atherosclerotic heart disease of native coronary artery without angina pectoris: Secondary | ICD-10-CM | POA: Insufficient documentation

## 2014-12-27 DIAGNOSIS — I10 Essential (primary) hypertension: Secondary | ICD-10-CM | POA: Insufficient documentation

## 2014-12-29 ENCOUNTER — Encounter (HOSPITAL_COMMUNITY): Payer: Self-pay

## 2014-12-29 ENCOUNTER — Ambulatory Visit: Payer: Medicare Other | Admitting: Pulmonary Disease

## 2014-12-31 ENCOUNTER — Encounter (HOSPITAL_COMMUNITY): Payer: Self-pay

## 2014-12-31 ENCOUNTER — Ambulatory Visit (INDEPENDENT_AMBULATORY_CARE_PROVIDER_SITE_OTHER): Payer: Medicare Other | Admitting: Endocrinology

## 2014-12-31 ENCOUNTER — Encounter: Payer: Self-pay | Admitting: Endocrinology

## 2014-12-31 VITALS — BP 134/82 | HR 68 | Temp 98.1°F | Ht 63.0 in | Wt 214.0 lb

## 2014-12-31 DIAGNOSIS — J069 Acute upper respiratory infection, unspecified: Secondary | ICD-10-CM | POA: Diagnosis not present

## 2014-12-31 MED ORDER — CEFUROXIME AXETIL 250 MG PO TABS: 250.0000 mg | ORAL_TABLET | Freq: Two times a day (BID) | ORAL | Status: AC

## 2014-12-31 MED ORDER — ISOSORBIDE MONONITRATE ER 60 MG PO TB24: 60.0000 mg | ORAL_TABLET | Freq: Every day | ORAL | Status: DC

## 2014-12-31 NOTE — Patient Instructions (Addendum)
i have sent a prescription to your pharmacy, for an antibiotic pill. zyrtec-d (non-prescription) will help your congestion.  check your blood sugar twice a day.  vary the time of day when you check, between before the 3 meals, and at bedtime.  also check if you have symptoms of your blood sugar being too high or too low.  please keep a record of the readings and bring it to your next appointment here.  You can write it on any piece of paper.  please call us sooner if your blood sugar goes below 70, or if you have a lot of readings over 200. Please come back for a "medicare wellness" appointment as scheduled next month.
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
## 2015-01-03 ENCOUNTER — Encounter (HOSPITAL_COMMUNITY): Payer: Self-pay

## 2015-01-05 ENCOUNTER — Encounter (HOSPITAL_COMMUNITY): Payer: Self-pay

## 2015-01-05 DIAGNOSIS — Z961 Presence of intraocular lens: Secondary | ICD-10-CM | POA: Diagnosis not present

## 2015-01-05 DIAGNOSIS — E119 Type 2 diabetes mellitus without complications: Secondary | ICD-10-CM | POA: Diagnosis not present

## 2015-01-07 ENCOUNTER — Encounter (HOSPITAL_COMMUNITY): Payer: Self-pay

## 2015-01-10 ENCOUNTER — Encounter (HOSPITAL_COMMUNITY): Payer: Self-pay

## 2015-01-12 ENCOUNTER — Encounter (HOSPITAL_COMMUNITY): Payer: Self-pay

## 2015-01-14 ENCOUNTER — Encounter (HOSPITAL_COMMUNITY): Payer: Self-pay

## 2015-01-16 ENCOUNTER — Other Ambulatory Visit: Payer: Self-pay | Admitting: Endocrinology

## 2015-01-17 ENCOUNTER — Encounter (HOSPITAL_COMMUNITY): Payer: Self-pay

## 2015-01-19 ENCOUNTER — Encounter (HOSPITAL_COMMUNITY): Payer: Self-pay

## 2015-01-20 ENCOUNTER — Telehealth: Payer: Self-pay | Admitting: Endocrinology

## 2015-01-20 NOTE — Telephone Encounter (Signed)
Pt needs pain med refilled please

## 2015-01-21 ENCOUNTER — Encounter (HOSPITAL_COMMUNITY): Payer: Self-pay

## 2015-01-21 MED ORDER — HYDROCODONE-ACETAMINOPHEN 10-325 MG PO TABS: 1.0000 | ORAL_TABLET | Freq: Four times a day (QID) | ORAL | Status: DC | PRN

## 2015-01-21 NOTE — Telephone Encounter (Signed)
See below. Rx for Hydrocodone was last written on 12/22/2014 and pt was last seen on 12/31/2014.  Thanks!

## 2015-01-21 NOTE — Telephone Encounter (Signed)
i printed 

## 2015-01-21 NOTE — Telephone Encounter (Signed)
Ok, i printed again

## 2015-01-21 NOTE — Addendum Note (Signed)
Addended by: [name] on: 01/21/2015 08:29 AM   Modules accepted: Orders

## 2015-01-21 NOTE — Addendum Note (Signed)
Addended by: [name] on: 01/21/2015 04:50 PM   Modules accepted: Orders

## 2015-01-21 NOTE — Telephone Encounter (Signed)
I could not locate RX. Not sure if it printed.  Thanks!

## 2015-01-24 ENCOUNTER — Encounter (HOSPITAL_COMMUNITY): Payer: Medicare Other

## 2015-01-24 DIAGNOSIS — I10 Essential (primary) hypertension: Secondary | ICD-10-CM | POA: Insufficient documentation

## 2015-01-24 DIAGNOSIS — I252 Old myocardial infarction: Secondary | ICD-10-CM | POA: Insufficient documentation

## 2015-01-24 DIAGNOSIS — I5042 Chronic combined systolic (congestive) and diastolic (congestive) heart failure: Secondary | ICD-10-CM | POA: Insufficient documentation

## 2015-01-24 DIAGNOSIS — Z5189 Encounter for other specified aftercare: Secondary | ICD-10-CM | POA: Insufficient documentation

## 2015-01-24 DIAGNOSIS — I2510 Atherosclerotic heart disease of native coronary artery without angina pectoris: Secondary | ICD-10-CM | POA: Insufficient documentation

## 2015-01-24 DIAGNOSIS — E785 Hyperlipidemia, unspecified: Secondary | ICD-10-CM | POA: Insufficient documentation

## 2015-01-24 MED ORDER — HYDROCODONE-ACETAMINOPHEN 10-325 MG PO TABS: 1.0000 | ORAL_TABLET | Freq: Four times a day (QID) | ORAL | Status: DC | PRN

## 2015-01-24 NOTE — Telephone Encounter (Signed)
Pt advised that rx is ready for pick up. Rx placed up front.  

## 2015-01-24 NOTE — Telephone Encounter (Signed)
Rx still unable to print.

## 2015-01-24 NOTE — Addendum Note (Signed)
Addended by: [name] on: 01/24/2015 01:33 PM   Modules accepted: Orders

## 2015-01-24 NOTE — Telephone Encounter (Signed)
i printed again

## 2015-01-25 ENCOUNTER — Other Ambulatory Visit: Payer: Self-pay | Admitting: Internal Medicine

## 2015-01-26 ENCOUNTER — Encounter (HOSPITAL_COMMUNITY)
Admission: RE | Admit: 2015-01-26 | Discharge: 2015-01-26 | Disposition: A | Discharge status: Home / Self Care | Payer: Self-pay | Source: Ambulatory Visit | Attending: Cardiovascular Disease | Admitting: Cardiovascular Disease

## 2015-01-27 ENCOUNTER — Other Ambulatory Visit: Payer: Self-pay | Admitting: Endocrinology

## 2015-01-28 ENCOUNTER — Encounter (HOSPITAL_COMMUNITY)
Admission: RE | Admit: 2015-01-28 | Discharge: 2015-01-28 | Disposition: A | Discharge status: Home / Self Care | Payer: Self-pay | Source: Ambulatory Visit | Attending: Cardiovascular Disease | Admitting: Cardiovascular Disease

## 2015-01-31 ENCOUNTER — Other Ambulatory Visit: Payer: Self-pay

## 2015-01-31 ENCOUNTER — Encounter (HOSPITAL_COMMUNITY)
Admission: RE | Admit: 2015-01-31 | Discharge: 2015-01-31 | Disposition: A | Discharge status: Home / Self Care | Payer: Self-pay | Source: Ambulatory Visit | Attending: Cardiovascular Disease | Admitting: Cardiovascular Disease

## 2015-01-31 MED ORDER — ALLOPURINOL 300 MG PO TABS: 300.0000 mg | ORAL_TABLET | Freq: Every day | ORAL | Status: DC

## 2015-02-02 ENCOUNTER — Encounter (HOSPITAL_COMMUNITY): Payer: Self-pay

## 2015-02-04 ENCOUNTER — Encounter (HOSPITAL_COMMUNITY)
Admission: RE | Admit: 2015-02-04 | Discharge: 2015-02-04 | Disposition: A | Discharge status: Home / Self Care | Payer: Self-pay | Source: Ambulatory Visit | Attending: Cardiovascular Disease | Admitting: Cardiovascular Disease

## 2015-02-07 ENCOUNTER — Encounter (HOSPITAL_COMMUNITY): Payer: Self-pay

## 2015-02-09 ENCOUNTER — Encounter (HOSPITAL_COMMUNITY): Payer: Self-pay

## 2015-02-10 ENCOUNTER — Ambulatory Visit (INDEPENDENT_AMBULATORY_CARE_PROVIDER_SITE_OTHER): Payer: Medicare Other | Admitting: Endocrinology

## 2015-02-10 ENCOUNTER — Encounter: Payer: Self-pay | Admitting: Endocrinology

## 2015-02-10 VITALS — BP 132/70 | HR 68 | Temp 98.3°F | Ht 63.0 in | Wt 214.0 lb

## 2015-02-10 DIAGNOSIS — N189 Chronic kidney disease, unspecified: Secondary | ICD-10-CM | POA: Diagnosis not present

## 2015-02-10 DIAGNOSIS — Z0000 Encounter for general adult medical examination without abnormal findings: Secondary | ICD-10-CM | POA: Diagnosis not present

## 2015-02-10 DIAGNOSIS — E785 Hyperlipidemia, unspecified: Secondary | ICD-10-CM

## 2015-02-10 DIAGNOSIS — D509 Iron deficiency anemia, unspecified: Secondary | ICD-10-CM

## 2015-02-10 DIAGNOSIS — M109 Gout, unspecified: Secondary | ICD-10-CM | POA: Diagnosis not present

## 2015-02-10 DIAGNOSIS — N259 Disorder resulting from impaired renal tubular function, unspecified: Secondary | ICD-10-CM

## 2015-02-10 DIAGNOSIS — E119 Type 2 diabetes mellitus without complications: Secondary | ICD-10-CM | POA: Diagnosis not present

## 2015-02-10 DIAGNOSIS — E1022 Type 1 diabetes mellitus with diabetic chronic kidney disease: Secondary | ICD-10-CM

## 2015-02-10 DIAGNOSIS — Z125 Encounter for screening for malignant neoplasm of prostate: Secondary | ICD-10-CM | POA: Diagnosis not present

## 2015-02-10 DIAGNOSIS — N2581 Secondary hyperparathyroidism of renal origin: Secondary | ICD-10-CM

## 2015-02-10 DIAGNOSIS — Z4651 Encounter for fitting and adjustment of gastric lap band: Secondary | ICD-10-CM | POA: Diagnosis not present

## 2015-02-10 LAB — MICROALBUMIN / CREATININE URINE RATIO
Creatinine,U: 71.8 mg/dL
MICROALB/CREAT RATIO: 52.9 mg/g — AB (ref 0.0–30.0)
Microalb, Ur: 38 mg/dL — ABNORMAL HIGH (ref 0.0–1.9)

## 2015-02-10 LAB — CBC WITH DIFFERENTIAL/PLATELET
Basophils Absolute: 0 10*3/uL (ref 0.0–0.1)
Basophils Relative: 0.3 % (ref 0.0–3.0)
EOS PCT: 3.5 % (ref 0.0–5.0)
Eosinophils Absolute: 0.3 10*3/uL (ref 0.0–0.7)
HEMATOCRIT: 40.2 % (ref 39.0–52.0)
Hemoglobin: 13.4 g/dL (ref 13.0–17.0)
Lymphocytes Relative: 14.2 % (ref 12.0–46.0)
Lymphs Abs: 1.3 10*3/uL (ref 0.7–4.0)
MCHC: 33.3 g/dL (ref 30.0–36.0)
MCV: 88.5 fl (ref 78.0–100.0)
MONOS PCT: 7.8 % (ref 3.0–12.0)
Monocytes Absolute: 0.7 10*3/uL (ref 0.1–1.0)
NEUTROS PCT: 74.2 % (ref 43.0–77.0)
Neutro Abs: 6.9 10*3/uL (ref 1.4–7.7)
PLATELETS: 260 10*3/uL (ref 150.0–400.0)
RBC: 4.54 Mil/uL (ref 4.22–5.81)
RDW: 15.1 % (ref 11.5–15.5)
WBC: 9.3 10*3/uL (ref 4.0–10.5)

## 2015-02-10 LAB — URINALYSIS, ROUTINE W REFLEX MICROSCOPIC
BILIRUBIN URINE: NEGATIVE
Hgb urine dipstick: NEGATIVE
Ketones, ur: NEGATIVE
Leukocytes, UA: NEGATIVE
NITRITE: NEGATIVE
RBC / HPF: NONE SEEN (ref 0–?)
Specific Gravity, Urine: 1.015 (ref 1.000–1.030)
TOTAL PROTEIN, URINE-UPE24: 30 — AB
Urine Glucose: NEGATIVE
Urobilinogen, UA: 0.2 (ref 0.0–1.0)
WBC UA: NONE SEEN (ref 0–?)
pH: 5.5 (ref 5.0–8.0)

## 2015-02-10 LAB — HEPATIC FUNCTION PANEL
ALT: 12 U/L (ref 0–53)
AST: 12 U/L (ref 0–37)
Albumin: 3.4 g/dL — ABNORMAL LOW (ref 3.5–5.2)
Alkaline Phosphatase: 83 U/L (ref 39–117)
Bilirubin, Direct: 0.1 mg/dL (ref 0.0–0.3)
Total Bilirubin: 0.4 mg/dL (ref 0.2–1.2)
Total Protein: 5.8 g/dL — ABNORMAL LOW (ref 6.0–8.3)

## 2015-02-10 LAB — TSH: TSH: 2.28 u[IU]/mL (ref 0.35–4.50)

## 2015-02-10 LAB — BASIC METABOLIC PANEL
BUN: 32 mg/dL — AB (ref 6–23)
CALCIUM: 9.1 mg/dL (ref 8.4–10.5)
CO2: 32 mEq/L (ref 19–32)
Chloride: 97 mEq/L (ref 96–112)
Creatinine, Ser: 1.45 mg/dL (ref 0.40–1.50)
GFR: 50.74 mL/min — ABNORMAL LOW (ref >60.00)
GLUCOSE: 219 mg/dL — AB (ref 70–99)
Potassium: 3.8 mEq/L (ref 3.5–5.1)
Sodium: 134 mEq/L — ABNORMAL LOW (ref 135–145)

## 2015-02-10 LAB — IBC PANEL
Iron: 42 ug/dL (ref 42–165)
Saturation Ratios: 13.9 % — ABNORMAL LOW (ref 20.0–50.0)
Transferrin: 216 mg/dL (ref 212.0–360.0)

## 2015-02-10 LAB — LIPID PANEL
Cholesterol: 153 mg/dL (ref 0–200)
HDL: 48.1 mg/dL (ref >39.00)
LDL Cholesterol: 86 mg/dL (ref 0–99)
NONHDL: 104.9
TRIGLYCERIDES: 93 mg/dL (ref 0.0–149.0)
Total CHOL/HDL Ratio: 3
VLDL: 18.6 mg/dL (ref 0.0–40.0)

## 2015-02-10 LAB — PSA, MEDICARE: PSA: 0.4 ng/ml (ref 0.10–4.00)

## 2015-02-10 LAB — HEMOGLOBIN A1C: HEMOGLOBIN A1C: 7 % — AB (ref 4.6–6.5)

## 2015-02-10 LAB — URIC ACID: Uric Acid, Serum: 4.2 mg/dL (ref 4.0–7.8)
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
## 2015-02-10 NOTE — Patient Instructions (Addendum)
here are some tests for blood in the bowels.  please follow the instructions, and return to the lab. please consider these measures for your health:  minimize alcohol.  do not use tobacco products.  have a colonoscopy at least every 10 years from age 74.  keep firearms safely stored.  always use seat belts.  have working smoke alarms in your home.  see an eye doctor and dentist regularly.  never drive under the influence of alcohol or drugs (including prescription drugs).  those with fair skin should take precautions against the sun. it is critically important to prevent falling down (keep floor areas well-lit, dry, and free of loose objects.  If you have a cane, walker, or wheelchair, you should use it, even for short trips around the house.  Also, try not to rush).   blood tests are being requested for you today.  We'll let you know about the results.  Please come back for a follow-up appointment in 3 months.

## 2015-02-10 NOTE — Progress Notes (Signed)
we discussed code status.  pt requests full code, but would not want to be started or maintained on artificial life-support measures if there was not a reasonable chance of recovery 

## 2015-02-11 ENCOUNTER — Encounter (HOSPITAL_COMMUNITY): Payer: Self-pay

## 2015-02-11 LAB — PTH, INTACT AND CALCIUM
Calcium: 8.8 mg/dL (ref 8.4–10.5)
PTH: 46 pg/mL (ref 14–64)

## 2015-02-14 ENCOUNTER — Encounter (HOSPITAL_COMMUNITY)
Admission: RE | Admit: 2015-02-14 | Discharge: 2015-02-14 | Disposition: A | Discharge status: Home / Self Care | Payer: Self-pay | Source: Ambulatory Visit | Attending: Cardiovascular Disease | Admitting: Cardiovascular Disease

## 2015-02-16 ENCOUNTER — Encounter (HOSPITAL_COMMUNITY)
Admission: RE | Admit: 2015-02-16 | Discharge: 2015-02-16 | Disposition: A | Discharge status: Home / Self Care | Payer: Self-pay | Source: Ambulatory Visit | Attending: Cardiovascular Disease | Admitting: Cardiovascular Disease

## 2015-02-18 ENCOUNTER — Other Ambulatory Visit: Payer: Medicare Other

## 2015-02-18 ENCOUNTER — Encounter (HOSPITAL_COMMUNITY)
Admission: RE | Admit: 2015-02-18 | Discharge: 2015-02-18 | Disposition: A | Discharge status: Home / Self Care | Payer: Self-pay | Source: Ambulatory Visit | Attending: Cardiovascular Disease | Admitting: Cardiovascular Disease

## 2015-02-21 ENCOUNTER — Encounter (HOSPITAL_COMMUNITY)
Admission: RE | Admit: 2015-02-21 | Discharge: 2015-02-21 | Disposition: A | Discharge status: Home / Self Care | Payer: Self-pay | Source: Ambulatory Visit | Attending: Cardiovascular Disease | Admitting: Cardiovascular Disease

## 2015-02-21 ENCOUNTER — Other Ambulatory Visit: Payer: Medicare Other

## 2015-02-23 ENCOUNTER — Encounter (HOSPITAL_COMMUNITY)
Admission: RE | Admit: 2015-02-23 | Discharge: 2015-02-23 | Disposition: A | Discharge status: Home / Self Care | Payer: Self-pay | Source: Ambulatory Visit | Attending: Cardiovascular Disease | Admitting: Cardiovascular Disease

## 2015-02-23 ENCOUNTER — Other Ambulatory Visit: Payer: Self-pay | Admitting: Endocrinology

## 2015-02-23 ENCOUNTER — Telehealth: Payer: Self-pay | Admitting: Endocrinology

## 2015-02-23 DIAGNOSIS — I10 Essential (primary) hypertension: Secondary | ICD-10-CM | POA: Insufficient documentation

## 2015-02-23 DIAGNOSIS — I252 Old myocardial infarction: Secondary | ICD-10-CM | POA: Insufficient documentation

## 2015-02-23 DIAGNOSIS — I5042 Chronic combined systolic (congestive) and diastolic (congestive) heart failure: Secondary | ICD-10-CM | POA: Insufficient documentation

## 2015-02-23 DIAGNOSIS — I2510 Atherosclerotic heart disease of native coronary artery without angina pectoris: Secondary | ICD-10-CM | POA: Insufficient documentation

## 2015-02-23 DIAGNOSIS — Z5189 Encounter for other specified aftercare: Secondary | ICD-10-CM | POA: Insufficient documentation

## 2015-02-23 DIAGNOSIS — E785 Hyperlipidemia, unspecified: Secondary | ICD-10-CM | POA: Insufficient documentation

## 2015-02-23 MED ORDER — HYDROCODONE-ACETAMINOPHEN 10-325 MG PO TABS: 1.0000 | ORAL_TABLET | Freq: Four times a day (QID) | ORAL | Status: DC | PRN

## 2015-02-23 NOTE — Telephone Encounter (Signed)
i printed 

## 2015-02-23 NOTE — Telephone Encounter (Signed)
Pt's wife advised that rx is ready for pick up. Rx placed up front.

## 2015-02-23 NOTE — Telephone Encounter (Signed)
Patient called and would like a refill on his pain medication    Please advise    Thank you

## 2015-02-23 NOTE — Telephone Encounter (Signed)
See note below. rx was last written on 01/24/2015 and pt was last seen on 02/10/2015.  Thanks!

## 2015-02-25 ENCOUNTER — Other Ambulatory Visit: Payer: Self-pay

## 2015-02-25 ENCOUNTER — Other Ambulatory Visit (INDEPENDENT_AMBULATORY_CARE_PROVIDER_SITE_OTHER): Payer: Medicare Other

## 2015-02-25 ENCOUNTER — Other Ambulatory Visit: Payer: Self-pay | Admitting: Endocrinology

## 2015-02-25 ENCOUNTER — Encounter (HOSPITAL_COMMUNITY)
Admission: RE | Admit: 2015-02-25 | Discharge: 2015-02-25 | Disposition: A | Discharge status: Home / Self Care | Payer: Self-pay | Source: Ambulatory Visit | Attending: Cardiovascular Disease | Admitting: Cardiovascular Disease

## 2015-02-25 DIAGNOSIS — Z1211 Encounter for screening for malignant neoplasm of colon: Secondary | ICD-10-CM

## 2015-02-25 LAB — HEMOCCULT SLIDES (X 3 CARDS)
Fecal Occult Blood: NEGATIVE
OCCULT 1: NEGATIVE
OCCULT 2: NEGATIVE
OCCULT 3: NEGATIVE
OCCULT 4: NEGATIVE
OCCULT 5: NEGATIVE

## 2015-02-28 ENCOUNTER — Encounter (HOSPITAL_COMMUNITY)
Admission: RE | Admit: 2015-02-28 | Discharge: 2015-02-28 | Disposition: A | Discharge status: Home / Self Care | Payer: Self-pay | Source: Ambulatory Visit | Attending: Cardiovascular Disease | Admitting: Cardiovascular Disease

## 2015-03-02 ENCOUNTER — Encounter (HOSPITAL_COMMUNITY): Payer: Self-pay

## 2015-03-04 ENCOUNTER — Encounter (HOSPITAL_COMMUNITY)
Admission: RE | Admit: 2015-03-04 | Discharge: 2015-03-04 | Disposition: A | Discharge status: Home / Self Care | Payer: Self-pay | Source: Ambulatory Visit | Attending: Cardiovascular Disease | Admitting: Cardiovascular Disease

## 2015-03-07 ENCOUNTER — Encounter (HOSPITAL_COMMUNITY)
Admission: RE | Admit: 2015-03-07 | Discharge: 2015-03-07 | Disposition: A | Discharge status: Home / Self Care | Payer: Self-pay | Source: Ambulatory Visit | Attending: Cardiovascular Disease | Admitting: Cardiovascular Disease

## 2015-03-09 ENCOUNTER — Encounter (HOSPITAL_COMMUNITY): Payer: Self-pay

## 2015-03-11 ENCOUNTER — Encounter (HOSPITAL_COMMUNITY)
Admission: RE | Admit: 2015-03-11 | Discharge: 2015-03-11 | Disposition: A | Discharge status: Home / Self Care | Payer: Self-pay | Source: Ambulatory Visit | Attending: Cardiovascular Disease | Admitting: Cardiovascular Disease

## 2015-03-13 ENCOUNTER — Other Ambulatory Visit: Payer: Self-pay | Admitting: Endocrinology

## 2015-03-13 ENCOUNTER — Other Ambulatory Visit: Payer: Self-pay | Admitting: Cardiovascular Disease

## 2015-03-13 ENCOUNTER — Other Ambulatory Visit: Payer: Self-pay | Admitting: Internal Medicine

## 2015-03-14 ENCOUNTER — Encounter (HOSPITAL_COMMUNITY)
Admission: RE | Admit: 2015-03-14 | Discharge: 2015-03-14 | Disposition: A | Discharge status: Home / Self Care | Payer: Self-pay | Source: Ambulatory Visit | Attending: Cardiovascular Disease | Admitting: Cardiovascular Disease

## 2015-03-14 NOTE — Progress Notes (Signed)
Pt c/o brief episode of dizziness while using seated chest press at cardiac rehab maintenance.  Pt reports episode was relieved once activity stopped and did not recur. Pt also c/o generalized malaise and fatigue r/t seasonal allergy symptoms.  Pt denies dizziness with other exercise or home activities.  BP:  148/60, HR-77, regular, O2 sat 96%.  Pt able to continue exercise without difficulty or return of symptoms.  Pt instructed to maintain adequate hydration.  Pt also instructed to notify MD if symptoms persist or worsen. Understanding verbalized

## 2015-03-16 ENCOUNTER — Encounter (HOSPITAL_COMMUNITY)
Admission: RE | Admit: 2015-03-16 | Discharge: 2015-03-16 | Disposition: A | Discharge status: Home / Self Care | Payer: Self-pay | Source: Ambulatory Visit | Attending: Cardiovascular Disease | Admitting: Cardiovascular Disease

## 2015-03-18 ENCOUNTER — Encounter (HOSPITAL_COMMUNITY): Payer: Self-pay

## 2015-03-21 ENCOUNTER — Encounter (HOSPITAL_COMMUNITY): Payer: Self-pay

## 2015-03-21 ENCOUNTER — Other Ambulatory Visit: Payer: Self-pay

## 2015-03-21 ENCOUNTER — Telehealth: Payer: Self-pay | Admitting: Pulmonary Disease

## 2015-03-21 MED ORDER — CITALOPRAM HYDROBROMIDE 20 MG PO TABS: 20.0000 mg | ORAL_TABLET | Freq: Every day | ORAL | Status: DC

## 2015-03-21 MED ORDER — FINASTERIDE 5 MG PO TABS: 5.0000 mg | ORAL_TABLET | Freq: Every day | ORAL | Status: DC

## 2015-03-21 MED ORDER — TRAZODONE HCL 100 MG PO TABS: 100.0000 mg | ORAL_TABLET | Freq: Every day | ORAL | Status: DC

## 2015-03-21 MED ORDER — FA-PYRIDOXINE-CYANOCOBALAMIN 2.5-25-2 MG PO TABS: 1.0000 | ORAL_TABLET | Freq: Every day | ORAL | Status: DC

## 2015-03-21 NOTE — Telephone Encounter (Signed)
Spoke with the pt  He is c/o sinus drainage, sore throat and prod cough with clear sputum for the past 5 days  He states that these symptoms are "making it hard to breathe"  He denies fever but has had sweats  He has tried benadryl and zyrtec with no relief  KC, please advise, thanks! Allergies  Allergen Reactions  . Enalapril Maleate     REACTION: cough  . Shellfish-Derived Products Swelling    Said occurred twice; has eaten some since and had no reactions

## 2015-03-21 NOTE — Telephone Encounter (Signed)
See if we can get him in tomorrow

## 2015-03-21 NOTE — Telephone Encounter (Signed)
Called pt and appt scheduled to see [place]. Nothing further needed

## 2015-03-22 ENCOUNTER — Ambulatory Visit (INDEPENDENT_AMBULATORY_CARE_PROVIDER_SITE_OTHER): Payer: Medicare Other | Admitting: Pulmonary Disease

## 2015-03-22 ENCOUNTER — Encounter: Payer: Self-pay | Admitting: Pulmonary Disease

## 2015-03-22 VITALS — BP 122/64 | HR 71 | Temp 97.0°F | Ht 63.0 in | Wt 206.2 lb

## 2015-03-22 DIAGNOSIS — J0191 Acute recurrent sinusitis, unspecified: Secondary | ICD-10-CM | POA: Diagnosis not present

## 2015-03-22 DIAGNOSIS — J438 Other emphysema: Secondary | ICD-10-CM

## 2015-03-22 MED ORDER — AMOXICILLIN-POT CLAVULANATE 875-125 MG PO TABS: 1.0000 | ORAL_TABLET | Freq: Two times a day (BID) | ORAL | Status: DC

## 2015-03-22 MED ORDER — PREDNISONE 10 MG PO TABS: ORAL_TABLET | ORAL | Status: DC

## 2015-03-22 NOTE — Assessment & Plan Note (Signed)
The patient is having some increased shortness of breath and does have some rhonchi on exam. However, there is no wheezing, and he does not appear to be in respiratory distress.

## 2015-03-22 NOTE — Assessment & Plan Note (Signed)
The patient's symptoms are most consistent with acute sinusitis, although this could also be a flare of his allergic rhinitis. Will treat him with a course of prednisone, as well as Augmentin. The patient is to let us know if he does not improve.

## 2015-03-22 NOTE — Patient Instructions (Signed)
Will treat with augmentin 875 am and pm on a full stomach with large glass of water for 7 days.  Prednisone taper over 8 days. Keep followup with me again at your scheduled apptm, but call if you are not getting better.

## 2015-03-22 NOTE — Progress Notes (Signed)
   Subjective:    Patient ID: [name] Alcon., male    DOB: Sep 19, 1941, 75 y.o.   MRN: [id number]  HPI The patient comes in today for an acute sick visit. He gives a five-day history of increasing sinus congestion, sinus pressure, and severe postnasal drip with cough. He has developed some chest congestion, but is not sure if he is bringing up mucus out of his chest. He is not aware of the color of his mucus, but feels that he is getting more ill. He has had some low-grade temperatures. He is having some increased shortness of breath but not severe at this point.   Review of Systems  Constitutional: Negative for fever and unexpected weight change.  HENT: Positive for congestion, postnasal drip, rhinorrhea, sinus pressure, sneezing, sore throat and trouble swallowing. Negative for dental problem, ear pain and nosebleeds.   Eyes: Negative for redness and itching.  Respiratory: Positive for cough, chest tightness, shortness of breath and wheezing.   Cardiovascular: Negative for palpitations and leg swelling.  Gastrointestinal: Negative for nausea and vomiting.  Genitourinary: Negative for dysuria.  Musculoskeletal: Negative for joint swelling.  Skin: Negative for rash.  Neurological: Positive for headaches.  Hematological: Does not bruise/bleed easily.  Psychiatric/Behavioral: Negative for dysphoric mood. The patient is not nervous/anxious.        Objective:   Physical Exam Obese male in no acute distress Nose with erythema and edema, but no definite purulence Oropharynx clear Neck without lymphadenopathy or thyromegaly Chest with rhonchi diffusely, but no wheezing or crackles Cardiac exam with regular rate and rhythm Lower extremities with 1-2+ edema, no cyanosis Alert and oriented, moves all 4 extremities.       Assessment & Plan:

## 2015-03-23 ENCOUNTER — Encounter (HOSPITAL_COMMUNITY): Payer: Self-pay

## 2015-03-25 ENCOUNTER — Encounter (HOSPITAL_COMMUNITY): Payer: Self-pay | Attending: Cardiovascular Disease

## 2015-03-25 DIAGNOSIS — J438 Other emphysema: Secondary | ICD-10-CM | POA: Insufficient documentation

## 2015-03-28 ENCOUNTER — Encounter (HOSPITAL_COMMUNITY): Payer: Self-pay

## 2015-03-28 ENCOUNTER — Other Ambulatory Visit: Payer: Self-pay | Admitting: Endocrinology

## 2015-03-29 ENCOUNTER — Ambulatory Visit: Payer: Medicare Other | Admitting: Endocrinology

## 2015-03-29 ENCOUNTER — Telehealth: Payer: Self-pay | Admitting: Endocrinology

## 2015-03-29 MED ORDER — HYDROCODONE-ACETAMINOPHEN 10-325 MG PO TABS: 1.0000 | ORAL_TABLET | Freq: Four times a day (QID) | ORAL | Status: DC | PRN

## 2015-03-29 NOTE — Telephone Encounter (Signed)
Pt advised Rx is ready for pick up. Rx is placed up front.

## 2015-03-29 NOTE — Telephone Encounter (Signed)
Pt called and needs refill on hydrocodone

## 2015-03-29 NOTE — Telephone Encounter (Signed)
See below. Rx was last refilled on 02/23/2015 and pt was last seen on 02/10/2015. Thanks!

## 2015-03-29 NOTE — Telephone Encounter (Signed)
i printed 

## 2015-03-30 ENCOUNTER — Encounter (HOSPITAL_COMMUNITY): Payer: Self-pay

## 2015-04-01 ENCOUNTER — Encounter (HOSPITAL_COMMUNITY): Payer: Self-pay

## 2015-04-04 ENCOUNTER — Encounter (HOSPITAL_COMMUNITY): Payer: Self-pay

## 2015-04-04 ENCOUNTER — Other Ambulatory Visit: Payer: Self-pay | Admitting: Endocrinology

## 2015-04-06 ENCOUNTER — Ambulatory Visit (INDEPENDENT_AMBULATORY_CARE_PROVIDER_SITE_OTHER): Payer: Medicare Other | Admitting: Pulmonary Disease

## 2015-04-06 ENCOUNTER — Telehealth: Payer: Self-pay | Admitting: Pulmonary Disease

## 2015-04-06 ENCOUNTER — Encounter: Payer: Self-pay | Admitting: Pulmonary Disease

## 2015-04-06 ENCOUNTER — Encounter (HOSPITAL_COMMUNITY): Payer: Self-pay

## 2015-04-06 ENCOUNTER — Ambulatory Visit (INDEPENDENT_AMBULATORY_CARE_PROVIDER_SITE_OTHER)
Admission: RE | Admit: 2015-04-06 | Discharge: 2015-04-06 | Disposition: A | Discharge status: Home / Self Care | Payer: Medicare Other | Source: Ambulatory Visit | Attending: Pulmonary Disease | Admitting: Pulmonary Disease

## 2015-04-06 VITALS — BP 142/72 | HR 76 | Temp 97.4°F | Ht 63.0 in | Wt 205.0 lb

## 2015-04-06 DIAGNOSIS — R05 Cough: Secondary | ICD-10-CM | POA: Diagnosis not present

## 2015-04-06 DIAGNOSIS — J438 Other emphysema: Secondary | ICD-10-CM

## 2015-04-06 DIAGNOSIS — J189 Pneumonia, unspecified organism: Secondary | ICD-10-CM | POA: Diagnosis not present

## 2015-04-06 DIAGNOSIS — R0602 Shortness of breath: Secondary | ICD-10-CM | POA: Diagnosis not present

## 2015-04-06 MED ORDER — LEVOFLOXACIN 750 MG PO TABS: 750.0000 mg | ORAL_TABLET | Freq: Every day | ORAL | Status: DC

## 2015-04-06 MED ORDER — PREDNISONE 10 MG PO TABS: ORAL_TABLET | ORAL | Status: DC

## 2015-04-06 NOTE — Assessment & Plan Note (Signed)
The patient has a right lower lobe airspace disease on chest x-ray today, and I suspect this was developing even from the last visit. He has been treated with a course of Augmentin, and I think he looks much better than the last visit.  He has not necessarily bringing up purulent mucus, but still has some chest congestion. Will treat with a course of Augmentin to see if this improves things.

## 2015-04-06 NOTE — Assessment & Plan Note (Signed)
The patient has mild wheezing on exam, but overall is better from his prior sick visit. Asked him to stay on his usual maintenance medications, and will give him another a day prednisone taper.

## 2015-04-06 NOTE — Progress Notes (Signed)
   Subjective:    Patient ID: [name]., male    DOB: 11/23/1941, 74 y.o.   MRN: [id number]  HPI Patient comes in today for an acute sick visit. He has known moderate COPD, and at the last visit was having symptoms of sinobronchitis with an acute COPD exacerbation. He was treated with an antibiotic and a course of prednisone, and did get some better. He is not returning to his usual baseline, and continues to have some cough and congestion but no purulence. He is weak, and doesn't feel his strength coming back. He is not having any nasal congestion or sinus pressure. He has had no fevers, chills, or sweats. A chest x-ray today shows a right lower lobe airspace disease consistent with pneumonia.   Review of Systems  Constitutional: Negative for fever and unexpected weight change.  HENT: Negative for congestion, dental problem, ear pain, nosebleeds, postnasal drip, rhinorrhea, sinus pressure, sneezing, sore throat and trouble swallowing.   Eyes: Negative for redness and itching.  Respiratory: Positive for cough, chest tightness and shortness of breath. Negative for wheezing.   Cardiovascular: Negative for palpitations and leg swelling.  Gastrointestinal: Negative for nausea and vomiting.  Genitourinary: Negative for dysuria.  Musculoskeletal: Negative for joint swelling.  Skin: Negative for rash.  Neurological: Negative for headaches.  Hematological: Does not bruise/bleed easily.  Psychiatric/Behavioral: Negative for dysphoric mood. The patient is not nervous/anxious.        Objective:   Physical Exam Overweight male in no acute distress Nose without purulence or discharge noted Neck without lymphadenopathy or thyromegaly Chest with rhonchi and a few wheezes, faint basilar crackles. Cardiac exam with regular rate and rhythm Lower extremities with 1+ edema, no cyanosis Alert and oriented, moves all 4 extremities.       Assessment & Plan:

## 2015-04-06 NOTE — Telephone Encounter (Signed)
Patient says that he saw Dr. Gwenette Greet three weeks ago, he still is not better.  He said that he has improved some, but he is just not breathing well at all.  He has a lot of mucus.  First thing in the morning he is coughing up a lot of foamy mucus.  Patient says that he has SOB, unable to catch his breath.  Feels like something is restricting his throat, he throat feels very tight, he doesn't have trouble eating, but when he tries to breath, he gasps for breath.    Slater - please advise.

## 2015-04-06 NOTE — Telephone Encounter (Signed)
Called pt and appt scheduled to see [place] this AM since he had openings.

## 2015-04-06 NOTE — Patient Instructions (Signed)
levaquin 750mg  one a day for 7 days Prednisone taper over 8 days. Let us know if you are not making progress and getting back to baseline.

## 2015-04-08 ENCOUNTER — Encounter (HOSPITAL_COMMUNITY): Payer: Self-pay

## 2015-04-11 ENCOUNTER — Encounter (HOSPITAL_COMMUNITY): Payer: Self-pay

## 2015-04-13 ENCOUNTER — Encounter (HOSPITAL_COMMUNITY): Payer: Self-pay

## 2015-04-15 ENCOUNTER — Encounter (HOSPITAL_COMMUNITY): Payer: Self-pay

## 2015-04-18 ENCOUNTER — Encounter (HOSPITAL_COMMUNITY): Payer: Self-pay

## 2015-04-20 ENCOUNTER — Encounter (HOSPITAL_COMMUNITY): Payer: Self-pay

## 2015-04-22 ENCOUNTER — Encounter (HOSPITAL_COMMUNITY): Payer: Self-pay

## 2015-04-25 ENCOUNTER — Encounter (HOSPITAL_COMMUNITY)
Admission: RE | Admit: 2015-04-25 | Discharge: 2015-04-25 | Disposition: A | Discharge status: Home / Self Care | Payer: Self-pay | Source: Ambulatory Visit | Attending: Cardiovascular Disease | Admitting: Cardiovascular Disease

## 2015-04-25 ENCOUNTER — Telehealth: Payer: Self-pay | Admitting: Endocrinology

## 2015-04-25 DIAGNOSIS — J438 Other emphysema: Secondary | ICD-10-CM | POA: Insufficient documentation

## 2015-04-25 MED ORDER — HYDROCODONE-ACETAMINOPHEN 10-325 MG PO TABS
1.0000 | ORAL_TABLET | Freq: Four times a day (QID) | ORAL | Status: DC | PRN
Start: 2015-04-25 — End: 2015-05-24

## 2015-04-25 NOTE — Telephone Encounter (Signed)
i printed 

## 2015-04-25 NOTE — Telephone Encounter (Signed)
See note below. Last Rx for Hydrocodone was 03-29-2015.  Thanks!

## 2015-04-25 NOTE — Telephone Encounter (Signed)
Patient advised that rx is ready for pick up. Rx placed up front.

## 2015-04-25 NOTE — Telephone Encounter (Signed)
Pt needs refill hyrdoconde

## 2015-04-27 ENCOUNTER — Encounter (HOSPITAL_COMMUNITY)
Admission: RE | Admit: 2015-04-27 | Discharge: 2015-04-27 | Disposition: A | Discharge status: Home / Self Care | Payer: Self-pay | Source: Ambulatory Visit | Attending: Cardiovascular Disease | Admitting: Cardiovascular Disease

## 2015-04-29 ENCOUNTER — Encounter (HOSPITAL_COMMUNITY)
Admission: RE | Admit: 2015-04-29 | Discharge: 2015-04-29 | Disposition: A | Discharge status: Home / Self Care | Payer: Self-pay | Source: Ambulatory Visit | Attending: Cardiovascular Disease | Admitting: Cardiovascular Disease

## 2015-05-02 ENCOUNTER — Encounter (HOSPITAL_COMMUNITY)
Admission: RE | Admit: 2015-05-02 | Discharge: 2015-05-02 | Disposition: A | Discharge status: Home / Self Care | Payer: Self-pay | Source: Ambulatory Visit | Attending: Cardiovascular Disease | Admitting: Cardiovascular Disease

## 2015-05-02 ENCOUNTER — Other Ambulatory Visit: Payer: Self-pay | Admitting: Endocrinology

## 2015-05-04 ENCOUNTER — Encounter (HOSPITAL_COMMUNITY)
Admission: RE | Admit: 2015-05-04 | Discharge: 2015-05-04 | Disposition: A | Discharge status: Home / Self Care | Payer: Self-pay | Source: Ambulatory Visit | Attending: Cardiovascular Disease | Admitting: Cardiovascular Disease

## 2015-05-05 DIAGNOSIS — E669 Obesity, unspecified: Secondary | ICD-10-CM | POA: Diagnosis not present

## 2015-05-05 DIAGNOSIS — I10 Essential (primary) hypertension: Secondary | ICD-10-CM | POA: Diagnosis not present

## 2015-05-05 DIAGNOSIS — N183 Chronic kidney disease, stage 3 (moderate): Secondary | ICD-10-CM | POA: Diagnosis not present

## 2015-05-05 DIAGNOSIS — I2510 Atherosclerotic heart disease of native coronary artery without angina pectoris: Secondary | ICD-10-CM | POA: Diagnosis not present

## 2015-05-06 ENCOUNTER — Encounter (HOSPITAL_COMMUNITY): Payer: Self-pay

## 2015-05-09 ENCOUNTER — Encounter (HOSPITAL_COMMUNITY)
Admission: RE | Admit: 2015-05-09 | Discharge: 2015-05-09 | Disposition: A | Discharge status: Home / Self Care | Payer: Self-pay | Source: Ambulatory Visit | Attending: Cardiovascular Disease | Admitting: Cardiovascular Disease

## 2015-05-10 ENCOUNTER — Ambulatory Visit (INDEPENDENT_AMBULATORY_CARE_PROVIDER_SITE_OTHER): Payer: Medicare Other | Admitting: Pulmonary Disease

## 2015-05-10 ENCOUNTER — Encounter: Payer: Self-pay | Admitting: Pulmonary Disease

## 2015-05-10 ENCOUNTER — Ambulatory Visit (INDEPENDENT_AMBULATORY_CARE_PROVIDER_SITE_OTHER)
Admission: RE | Admit: 2015-05-10 | Discharge: 2015-05-10 | Disposition: A | Discharge status: Home / Self Care | Payer: Medicare Other | Source: Ambulatory Visit | Attending: Pulmonary Disease | Admitting: Pulmonary Disease

## 2015-05-10 VITALS — BP 144/66 | HR 72 | Temp 97.9°F | Ht 63.0 in | Wt 208.0 lb

## 2015-05-10 DIAGNOSIS — J438 Other emphysema: Secondary | ICD-10-CM

## 2015-05-10 DIAGNOSIS — J449 Chronic obstructive pulmonary disease, unspecified: Secondary | ICD-10-CM | POA: Diagnosis not present

## 2015-05-10 NOTE — Patient Instructions (Signed)
No change in medications Will check chest xray today to make sure everything has cleared up. followup with Dr. Lamonte Sakai in 54mos.

## 2015-05-10 NOTE — Progress Notes (Signed)
   Subjective:    Patient ID: [name]., male    DOB: 1941/09/21, 74 y.o.   MRN: [id number]  HPI The patient comes in today for follow-up after his recent pneumonia. He required 2 rounds of antibiotics, and feels that he is much improved. He has no significant chest congestion or cough, and feels that his exertional tolerance is near his usual baseline. He is continuing on his medications for COPD, as well as pulmonary rehabilitation.   Review of Systems  Constitutional: Negative for fever and unexpected weight change.  HENT: Negative for congestion, dental problem, ear pain, nosebleeds, postnasal drip, rhinorrhea, sinus pressure, sneezing, sore throat and trouble swallowing.   Eyes: Negative for redness and itching.  Respiratory: Positive for cough and shortness of breath. Negative for chest tightness and wheezing.   Cardiovascular: Negative for palpitations and leg swelling.  Gastrointestinal: Negative for nausea and vomiting.  Genitourinary: Negative for dysuria.  Musculoskeletal: Negative for joint swelling.  Skin: Negative for rash.  Neurological: Negative for headaches.  Hematological: Does not bruise/bleed easily.  Psychiatric/Behavioral: Negative for dysphoric mood. The patient is not nervous/anxious.        Objective:   Physical Exam Obese male in no acute distress Nose without purulence or discharge noted Neck without lymphadenopathy or thyromegaly Chest with a few basilar crackles, good airflow bilaterally with no wheezing Cardiac exam with regular rate and rhythm Lower extremities with 2+ edema bilaterally, no cyanosis Alert and oriented, moves all 4 extremities.       Assessment & Plan:

## 2015-05-10 NOTE — Assessment & Plan Note (Signed)
The patient is much improved after 2 rounds of antibiotics for his pneumonia on chest x-ray and exam. He feels that he is almost back to his usual baseline, and is having no significant congestion or purulence. I will do a follow-up chest x-ray today to make sure this is resolving, and I've asked him to continue on his current meds.

## 2015-05-11 ENCOUNTER — Encounter (HOSPITAL_COMMUNITY)
Admission: RE | Admit: 2015-05-11 | Discharge: 2015-05-11 | Disposition: A | Discharge status: Home / Self Care | Payer: Self-pay | Source: Ambulatory Visit | Attending: Cardiovascular Disease | Admitting: Cardiovascular Disease

## 2015-05-11 ENCOUNTER — Encounter: Payer: Self-pay | Admitting: Endocrinology

## 2015-05-11 ENCOUNTER — Ambulatory Visit (INDEPENDENT_AMBULATORY_CARE_PROVIDER_SITE_OTHER): Payer: Medicare Other | Admitting: Endocrinology

## 2015-05-11 VITALS — BP 112/60 | HR 70 | Temp 98.3°F | Ht 63.0 in | Wt 208.0 lb

## 2015-05-11 DIAGNOSIS — N189 Chronic kidney disease, unspecified: Secondary | ICD-10-CM | POA: Diagnosis not present

## 2015-05-11 DIAGNOSIS — E1022 Type 1 diabetes mellitus with diabetic chronic kidney disease: Secondary | ICD-10-CM

## 2015-05-11 LAB — HEMOGLOBIN A1C: Hgb A1c MFr Bld: 6.9 % — ABNORMAL HIGH (ref 4.6–6.5)
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
## 2015-05-11 NOTE — Patient Instructions (Addendum)
check your blood sugar twice a day.  vary the time of day when you check, between before the 3 meals, and at bedtime.  also check if you have symptoms of your blood sugar being too high or too low.  please keep a record of the readings and bring it to your next appointment here.  You can write it on any piece of paper.  please call us sooner if your blood sugar goes below 70, or if you have a lot of readings over 200. A diabetes blood test is requested for you today.  We'll let you know about the results.  Please come back for a follow-up appointment in 4 months.

## 2015-05-13 ENCOUNTER — Encounter (HOSPITAL_COMMUNITY)
Admission: RE | Admit: 2015-05-13 | Discharge: 2015-05-13 | Disposition: A | Discharge status: Home / Self Care | Payer: Self-pay | Source: Ambulatory Visit | Attending: Cardiovascular Disease | Admitting: Cardiovascular Disease

## 2015-05-15 ENCOUNTER — Other Ambulatory Visit: Payer: Self-pay | Admitting: Cardiovascular Disease

## 2015-05-16 ENCOUNTER — Encounter (HOSPITAL_COMMUNITY): Payer: Self-pay

## 2015-05-18 ENCOUNTER — Encounter (HOSPITAL_COMMUNITY)
Admission: RE | Admit: 2015-05-18 | Discharge: 2015-05-18 | Disposition: A | Discharge status: Home / Self Care | Payer: Self-pay | Source: Ambulatory Visit | Attending: Cardiovascular Disease | Admitting: Cardiovascular Disease

## 2015-05-20 ENCOUNTER — Encounter (HOSPITAL_COMMUNITY)
Admission: RE | Admit: 2015-05-20 | Discharge: 2015-05-20 | Disposition: A | Discharge status: Home / Self Care | Payer: Self-pay | Source: Ambulatory Visit | Attending: Cardiovascular Disease | Admitting: Cardiovascular Disease

## 2015-05-24 ENCOUNTER — Telehealth: Payer: Self-pay | Admitting: Endocrinology

## 2015-05-24 MED ORDER — HYDROCODONE-ACETAMINOPHEN 10-325 MG PO TABS: 1.0000 | ORAL_TABLET | Freq: Four times a day (QID) | ORAL | Status: DC | PRN

## 2015-05-24 NOTE — Telephone Encounter (Signed)
Patient would like a refill on his medication   Rx: Hydrocodone    Thank you

## 2015-05-24 NOTE — Telephone Encounter (Signed)
I contacted the patient and advised Rx is ready for pick up. Rx placed up front.  

## 2015-05-24 NOTE — Telephone Encounter (Signed)
See below. Hydrocodone was last refilled on 04-25-2015 . Thanks!

## 2015-05-24 NOTE — Telephone Encounter (Signed)
i printed 

## 2015-05-25 ENCOUNTER — Encounter (HOSPITAL_COMMUNITY)
Admission: RE | Admit: 2015-05-25 | Discharge: 2015-05-25 | Disposition: A | Discharge status: Home / Self Care | Payer: Self-pay | Source: Ambulatory Visit | Attending: Cardiovascular Disease | Admitting: Cardiovascular Disease

## 2015-05-25 DIAGNOSIS — J438 Other emphysema: Secondary | ICD-10-CM | POA: Insufficient documentation

## 2015-05-27 ENCOUNTER — Encounter (HOSPITAL_COMMUNITY)
Admission: RE | Admit: 2015-05-27 | Discharge: 2015-05-27 | Disposition: A | Discharge status: Home / Self Care | Payer: Self-pay | Source: Ambulatory Visit | Attending: Cardiovascular Disease | Admitting: Cardiovascular Disease

## 2015-05-30 ENCOUNTER — Encounter (HOSPITAL_COMMUNITY)
Admission: RE | Admit: 2015-05-30 | Discharge: 2015-05-30 | Disposition: A | Discharge status: Home / Self Care | Payer: Self-pay | Source: Ambulatory Visit | Attending: Cardiovascular Disease | Admitting: Cardiovascular Disease

## 2015-06-01 ENCOUNTER — Encounter (HOSPITAL_COMMUNITY): Payer: Self-pay

## 2015-06-03 ENCOUNTER — Encounter (HOSPITAL_COMMUNITY)
Admission: RE | Admit: 2015-06-03 | Discharge: 2015-06-03 | Disposition: A | Discharge status: Home / Self Care | Payer: Self-pay | Source: Ambulatory Visit | Attending: Cardiovascular Disease | Admitting: Cardiovascular Disease

## 2015-06-06 ENCOUNTER — Encounter (HOSPITAL_COMMUNITY)
Admission: RE | Admit: 2015-06-06 | Discharge: 2015-06-06 | Disposition: A | Discharge status: Home / Self Care | Payer: Self-pay | Source: Ambulatory Visit | Attending: Cardiovascular Disease | Admitting: Cardiovascular Disease

## 2015-06-08 ENCOUNTER — Other Ambulatory Visit: Payer: Self-pay | Admitting: Endocrinology

## 2015-06-08 ENCOUNTER — Encounter (HOSPITAL_COMMUNITY): Payer: Self-pay

## 2015-06-10 ENCOUNTER — Encounter (HOSPITAL_COMMUNITY)
Admission: RE | Admit: 2015-06-10 | Discharge: 2015-06-10 | Disposition: A | Discharge status: Home / Self Care | Payer: Self-pay | Source: Ambulatory Visit | Attending: Cardiovascular Disease | Admitting: Cardiovascular Disease

## 2015-06-11 ENCOUNTER — Other Ambulatory Visit: Payer: Self-pay | Admitting: Endocrinology

## 2015-06-13 ENCOUNTER — Encounter (HOSPITAL_COMMUNITY): Payer: Self-pay

## 2015-06-15 ENCOUNTER — Encounter (HOSPITAL_COMMUNITY)
Admission: RE | Admit: 2015-06-15 | Discharge: 2015-06-15 | Disposition: A | Discharge status: Home / Self Care | Payer: Self-pay | Source: Ambulatory Visit | Attending: Cardiovascular Disease | Admitting: Cardiovascular Disease

## 2015-06-17 ENCOUNTER — Encounter (HOSPITAL_COMMUNITY)
Admission: RE | Admit: 2015-06-17 | Discharge: 2015-06-17 | Disposition: A | Discharge status: Home / Self Care | Payer: Self-pay | Source: Ambulatory Visit | Attending: Cardiovascular Disease | Admitting: Cardiovascular Disease

## 2015-06-17 ENCOUNTER — Other Ambulatory Visit: Payer: Self-pay | Admitting: Cardiovascular Disease

## 2015-06-20 ENCOUNTER — Encounter (HOSPITAL_COMMUNITY)
Admission: RE | Admit: 2015-06-20 | Discharge: 2015-06-20 | Disposition: A | Discharge status: Home / Self Care | Payer: Self-pay | Source: Ambulatory Visit | Attending: Cardiovascular Disease | Admitting: Cardiovascular Disease

## 2015-06-20 ENCOUNTER — Other Ambulatory Visit: Payer: Self-pay

## 2015-06-22 ENCOUNTER — Ambulatory Visit (INDEPENDENT_AMBULATORY_CARE_PROVIDER_SITE_OTHER): Payer: Medicare Other | Admitting: Cardiovascular Disease

## 2015-06-22 ENCOUNTER — Encounter (HOSPITAL_COMMUNITY): Payer: Self-pay

## 2015-06-22 ENCOUNTER — Encounter: Payer: Self-pay | Admitting: Cardiovascular Disease

## 2015-06-22 VITALS — BP 110/56 | HR 70 | Ht 63.0 in | Wt 209.0 lb

## 2015-06-22 DIAGNOSIS — I255 Ischemic cardiomyopathy: Secondary | ICD-10-CM

## 2015-06-22 DIAGNOSIS — I2510 Atherosclerotic heart disease of native coronary artery without angina pectoris: Secondary | ICD-10-CM

## 2015-06-22 DIAGNOSIS — I10 Essential (primary) hypertension: Secondary | ICD-10-CM | POA: Diagnosis not present

## 2015-06-22 DIAGNOSIS — I5042 Chronic combined systolic (congestive) and diastolic (congestive) heart failure: Secondary | ICD-10-CM

## 2015-06-22 DIAGNOSIS — E785 Hyperlipidemia, unspecified: Secondary | ICD-10-CM

## 2015-06-22 MED ORDER — CLOPIDOGREL BISULFATE 75 MG PO TABS: 75.0000 mg | ORAL_TABLET | Freq: Every day | ORAL | Status: DC
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
## 2015-06-22 NOTE — Patient Instructions (Signed)
Medication Instructions:  Your physician recommends that you continue on your current medications as directed. Please refer to the Current Medication list given to you today.   Labwork: none  Testing/Procedures: none  Follow-Up: Your physician wants you to follow-up in:  12 months.  You will receive a reminder letter in the mail two months in advance. If you don't receive a letter, please call our office to schedule the follow-up appointment.        

## 2015-06-23 ENCOUNTER — Telehealth: Payer: Self-pay | Admitting: Endocrinology

## 2015-06-23 MED ORDER — HYDROCODONE-ACETAMINOPHEN 10-325 MG PO TABS: 1.0000 | ORAL_TABLET | Freq: Four times a day (QID) | ORAL | Status: DC | PRN

## 2015-06-23 NOTE — Telephone Encounter (Signed)
See below. Rx was last refill 05/24/2015. Thanks!

## 2015-06-23 NOTE — Telephone Encounter (Addendum)
Pt advised that rx is ready for pick up. Rx placed up front.  

## 2015-06-23 NOTE — Telephone Encounter (Signed)
i printed 

## 2015-06-23 NOTE — Telephone Encounter (Signed)
Patient would like a refill on his hydrocodone    Please advise    Thank you

## 2015-06-24 ENCOUNTER — Encounter (HOSPITAL_COMMUNITY)
Admission: RE | Admit: 2015-06-24 | Discharge: 2015-06-24 | Disposition: A | Discharge status: Home / Self Care | Payer: Self-pay | Source: Ambulatory Visit | Attending: Cardiovascular Disease | Admitting: Cardiovascular Disease

## 2015-06-24 ENCOUNTER — Other Ambulatory Visit: Payer: Self-pay | Admitting: Endocrinology

## 2015-06-24 DIAGNOSIS — J438 Other emphysema: Secondary | ICD-10-CM | POA: Insufficient documentation

## 2015-06-29 ENCOUNTER — Encounter (HOSPITAL_COMMUNITY)
Admission: RE | Admit: 2015-06-29 | Discharge: 2015-06-29 | Disposition: A | Discharge status: Home / Self Care | Payer: Self-pay | Source: Ambulatory Visit | Attending: Cardiovascular Disease | Admitting: Cardiovascular Disease

## 2015-07-01 ENCOUNTER — Encounter (HOSPITAL_COMMUNITY): Payer: Self-pay

## 2015-07-01 ENCOUNTER — Other Ambulatory Visit: Payer: Self-pay | Admitting: Endocrinology

## 2015-07-04 ENCOUNTER — Encounter (HOSPITAL_COMMUNITY)
Admission: RE | Admit: 2015-07-04 | Discharge: 2015-07-04 | Disposition: A | Discharge status: Home / Self Care | Payer: Self-pay | Source: Ambulatory Visit | Attending: Cardiovascular Disease | Admitting: Cardiovascular Disease

## 2015-07-06 ENCOUNTER — Encounter (HOSPITAL_COMMUNITY)
Admission: RE | Admit: 2015-07-06 | Discharge: 2015-07-06 | Disposition: A | Discharge status: Home / Self Care | Payer: Self-pay | Source: Ambulatory Visit | Attending: Cardiovascular Disease | Admitting: Cardiovascular Disease

## 2015-07-08 ENCOUNTER — Encounter (HOSPITAL_COMMUNITY): Payer: Self-pay

## 2015-07-08 ENCOUNTER — Other Ambulatory Visit: Payer: Self-pay | Admitting: Cardiovascular Disease

## 2015-07-11 ENCOUNTER — Encounter (HOSPITAL_COMMUNITY)
Admission: RE | Admit: 2015-07-11 | Discharge: 2015-07-11 | Disposition: A | Discharge status: Home / Self Care | Payer: Self-pay | Source: Ambulatory Visit | Attending: Cardiovascular Disease | Admitting: Cardiovascular Disease

## 2015-07-13 ENCOUNTER — Encounter (HOSPITAL_COMMUNITY)
Admission: RE | Admit: 2015-07-13 | Discharge: 2015-07-13 | Disposition: A | Discharge status: Home / Self Care | Payer: Self-pay | Source: Ambulatory Visit | Attending: Cardiovascular Disease | Admitting: Cardiovascular Disease

## 2015-07-14 DIAGNOSIS — Z4651 Encounter for fitting and adjustment of gastric lap band: Secondary | ICD-10-CM | POA: Diagnosis not present

## 2015-07-15 ENCOUNTER — Encounter (HOSPITAL_COMMUNITY): Payer: Self-pay

## 2015-07-18 ENCOUNTER — Encounter (HOSPITAL_COMMUNITY)
Admission: RE | Admit: 2015-07-18 | Discharge: 2015-07-18 | Disposition: A | Discharge status: Home / Self Care | Payer: Self-pay | Source: Ambulatory Visit | Attending: Cardiovascular Disease | Admitting: Cardiovascular Disease

## 2015-07-20 ENCOUNTER — Encounter (HOSPITAL_COMMUNITY): Payer: Self-pay

## 2015-07-22 ENCOUNTER — Encounter (HOSPITAL_COMMUNITY)
Admission: RE | Admit: 2015-07-22 | Discharge: 2015-07-22 | Disposition: A | Discharge status: Home / Self Care | Payer: Self-pay | Source: Ambulatory Visit | Attending: Cardiovascular Disease | Admitting: Cardiovascular Disease

## 2015-07-25 ENCOUNTER — Telehealth: Payer: Self-pay | Admitting: Endocrinology

## 2015-07-25 ENCOUNTER — Encounter (HOSPITAL_COMMUNITY)
Admission: RE | Admit: 2015-07-25 | Discharge: 2015-07-25 | Disposition: A | Discharge status: Home / Self Care | Payer: Self-pay | Source: Ambulatory Visit | Attending: Cardiovascular Disease | Admitting: Cardiovascular Disease

## 2015-07-25 ENCOUNTER — Other Ambulatory Visit: Payer: Self-pay

## 2015-07-25 DIAGNOSIS — J438 Other emphysema: Secondary | ICD-10-CM | POA: Insufficient documentation

## 2015-07-25 NOTE — Telephone Encounter (Signed)
Patient called this morning requesting his pain medication  I advised [name] that both Dr. Loanne Drilling and his nurse were out on vacation  Patient claims he does not have enough medication till their return   Rx: Hydrocodone 10-325 mg   Please advise   Thank you

## 2015-07-26 NOTE — Telephone Encounter (Signed)
See below. Last rx was  Given on 06/23/2015. Could you sign during Dr. Cordelia Pen absence? Thanks!

## 2015-07-26 NOTE — Telephone Encounter (Signed)
Please let me know last pain medication prescription date, what he takes it for and how often

## 2015-07-27 ENCOUNTER — Encounter (HOSPITAL_COMMUNITY)
Admission: RE | Admit: 2015-07-27 | Discharge: 2015-07-27 | Disposition: A | Discharge status: Home / Self Care | Payer: Self-pay | Source: Ambulatory Visit | Attending: Cardiovascular Disease | Admitting: Cardiovascular Disease

## 2015-07-27 MED ORDER — HYDROCODONE-ACETAMINOPHEN 10-325 MG PO TABS: 1.0000 | ORAL_TABLET | Freq: Four times a day (QID) | ORAL | Status: DC | PRN

## 2015-07-27 NOTE — Telephone Encounter (Signed)
I contacted the pt and advised his Rx refill is ready for pick up. Rx placed upfront. Pt advised Controlled substance contract needs to be signed and urine sample needs to be provided. Pt voiced understanding.

## 2015-07-27 NOTE — Telephone Encounter (Signed)
He can refill prescription
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
## 2015-07-29 ENCOUNTER — Encounter (HOSPITAL_COMMUNITY): Payer: Self-pay

## 2015-08-01 ENCOUNTER — Encounter (HOSPITAL_COMMUNITY)
Admission: RE | Admit: 2015-08-01 | Discharge: 2015-08-01 | Disposition: A | Discharge status: Home / Self Care | Payer: Self-pay | Source: Ambulatory Visit | Attending: Cardiovascular Disease | Admitting: Cardiovascular Disease

## 2015-08-01 DIAGNOSIS — Z79891 Long term (current) use of opiate analgesic: Secondary | ICD-10-CM | POA: Diagnosis not present

## 2015-08-03 ENCOUNTER — Other Ambulatory Visit: Payer: Self-pay | Admitting: Cardiovascular Disease

## 2015-08-03 ENCOUNTER — Encounter (HOSPITAL_COMMUNITY)
Admission: RE | Admit: 2015-08-03 | Discharge: 2015-08-03 | Disposition: A | Discharge status: Home / Self Care | Payer: Self-pay | Source: Ambulatory Visit | Attending: Cardiovascular Disease | Admitting: Cardiovascular Disease

## 2015-08-03 ENCOUNTER — Other Ambulatory Visit: Payer: Self-pay | Admitting: Endocrinology

## 2015-08-05 ENCOUNTER — Telehealth: Payer: Self-pay | Admitting: Endocrinology

## 2015-08-05 ENCOUNTER — Encounter (HOSPITAL_COMMUNITY)
Admission: RE | Admit: 2015-08-05 | Discharge: 2015-08-05 | Disposition: A | Discharge status: Home / Self Care | Payer: Self-pay | Source: Ambulatory Visit | Attending: Cardiovascular Disease | Admitting: Cardiovascular Disease

## 2015-08-05 NOTE — Telephone Encounter (Signed)
I contacted the pt. Pt states he think he has a possible sinus infection. Pt is coming to the office on 08/08/2015 at 1 to be evaluated.

## 2015-08-05 NOTE — Telephone Encounter (Signed)
Pt has an infected ear, please advise

## 2015-08-05 NOTE — Telephone Encounter (Signed)
Pt returning your call

## 2015-08-05 NOTE — Telephone Encounter (Signed)
I tired to contact the pt. Pt was unavailable at this time.

## 2015-08-08 ENCOUNTER — Other Ambulatory Visit (INDEPENDENT_AMBULATORY_CARE_PROVIDER_SITE_OTHER): Payer: Medicare Other

## 2015-08-08 ENCOUNTER — Encounter (HOSPITAL_COMMUNITY): Payer: Self-pay

## 2015-08-08 ENCOUNTER — Ambulatory Visit (INDEPENDENT_AMBULATORY_CARE_PROVIDER_SITE_OTHER)
Admission: RE | Admit: 2015-08-08 | Discharge: 2015-08-08 | Disposition: A | Discharge status: Home / Self Care | Payer: Medicare Other | Source: Ambulatory Visit | Attending: Endocrinology | Admitting: Endocrinology

## 2015-08-08 ENCOUNTER — Encounter: Payer: Self-pay | Admitting: Endocrinology

## 2015-08-08 ENCOUNTER — Ambulatory Visit (INDEPENDENT_AMBULATORY_CARE_PROVIDER_SITE_OTHER): Payer: Medicare Other | Admitting: Endocrinology

## 2015-08-08 VITALS — BP 132/64 | HR 84 | Temp 100.3°F | Ht 62.0 in | Wt 205.0 lb

## 2015-08-08 DIAGNOSIS — N189 Chronic kidney disease, unspecified: Secondary | ICD-10-CM

## 2015-08-08 DIAGNOSIS — R509 Fever, unspecified: Secondary | ICD-10-CM

## 2015-08-08 DIAGNOSIS — E1022 Type 1 diabetes mellitus with diabetic chronic kidney disease: Secondary | ICD-10-CM

## 2015-08-08 DIAGNOSIS — E11319 Type 2 diabetes mellitus with unspecified diabetic retinopathy without macular edema: Secondary | ICD-10-CM | POA: Diagnosis not present

## 2015-08-08 DIAGNOSIS — I255 Ischemic cardiomyopathy: Secondary | ICD-10-CM

## 2015-08-08 DIAGNOSIS — R0602 Shortness of breath: Secondary | ICD-10-CM | POA: Diagnosis not present

## 2015-08-08 LAB — CBC WITH DIFFERENTIAL/PLATELET
Basophils Absolute: 0 10*3/uL (ref 0.0–0.1)
Basophils Relative: 0 % (ref 0.0–3.0)
EOS PCT: 0.5 % (ref 0.0–5.0)
Eosinophils Absolute: 0.1 10*3/uL (ref 0.0–0.7)
HCT: 41.7 % (ref 39.0–52.0)
HEMOGLOBIN: 13.5 g/dL (ref 13.0–17.0)
Lymphocytes Relative: 4.5 % — ABNORMAL LOW (ref 12.0–46.0)
Lymphs Abs: 0.7 10*3/uL (ref 0.7–4.0)
MCHC: 32.4 g/dL (ref 30.0–36.0)
MCV: 91 fl (ref 78.0–100.0)
MONOS PCT: 5.1 % (ref 3.0–12.0)
Monocytes Absolute: 0.8 10*3/uL (ref 0.1–1.0)
Neutro Abs: 14.7 10*3/uL — ABNORMAL HIGH (ref 1.4–7.7)
Neutrophils Relative %: 89.9 % — ABNORMAL HIGH (ref 43.0–77.0)
Platelets: 269 10*3/uL (ref 150.0–400.0)
RBC: 4.59 Mil/uL (ref 4.22–5.81)
RDW: 15 % (ref 11.5–15.5)

## 2015-08-08 LAB — URINALYSIS, ROUTINE W REFLEX MICROSCOPIC
BILIRUBIN URINE: NEGATIVE
Hgb urine dipstick: NEGATIVE
KETONES UR: NEGATIVE
LEUKOCYTES UA: NEGATIVE
NITRITE: NEGATIVE
PH: 6.5 (ref 5.0–8.0)
Specific Gravity, Urine: 1.01 (ref 1.000–1.030)
TOTAL PROTEIN, URINE-UPE24: 30 — AB
URINE GLUCOSE: NEGATIVE
Urobilinogen, UA: 0.2 (ref 0.0–1.0)

## 2015-08-08 LAB — POCT GLYCOSYLATED HEMOGLOBIN (HGB A1C): Hemoglobin A1C: 6.8

## 2015-08-08 MED ORDER — CEPHALEXIN 500 MG PO CAPS: 500.0000 mg | ORAL_CAPSULE | Freq: Four times a day (QID) | ORAL | Status: DC

## 2015-08-08 NOTE — Patient Instructions (Addendum)
check your blood sugar twice a day.  vary the time of day when you check, between before the 3 meals, and at bedtime.  also check if you have symptoms of your blood sugar being too high or too low.  please keep a record of the readings and bring it to your next appointment here.  You can write it on any piece of paper.  please call us sooner if your blood sugar goes below 70, or if you have a lot of readings over 200. Please decrease the lunch insulin to 5 units. Please come back for a follow-up appointment in 4 months.   blood tests, and a chest x-ray are requested for you today.  We'll let you know about the results. Even if these tests are normal, please call us in 2 days if the fever persists. i have sent a prescription to your pharmacy, for an antibiotic pill.
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
## 2015-08-10 ENCOUNTER — Encounter (HOSPITAL_COMMUNITY): Payer: Self-pay

## 2015-08-12 ENCOUNTER — Encounter (HOSPITAL_COMMUNITY): Payer: Self-pay

## 2015-08-15 ENCOUNTER — Encounter (HOSPITAL_COMMUNITY): Payer: Self-pay

## 2015-08-17 ENCOUNTER — Encounter (HOSPITAL_COMMUNITY): Payer: Self-pay

## 2015-08-19 ENCOUNTER — Encounter (HOSPITAL_COMMUNITY): Payer: Self-pay

## 2015-08-22 ENCOUNTER — Encounter (HOSPITAL_COMMUNITY)
Admission: RE | Admit: 2015-08-22 | Discharge: 2015-08-22 | Disposition: A | Discharge status: Home / Self Care | Payer: Self-pay | Source: Ambulatory Visit | Attending: Cardiovascular Disease | Admitting: Cardiovascular Disease

## 2015-08-23 ENCOUNTER — Encounter: Payer: Self-pay | Admitting: Endocrinology

## 2015-08-23 ENCOUNTER — Telehealth: Payer: Self-pay | Admitting: Endocrinology

## 2015-08-23 NOTE — Telephone Encounter (Signed)
Pt needs refill on hydrocodene

## 2015-08-24 ENCOUNTER — Encounter (HOSPITAL_COMMUNITY): Payer: Self-pay

## 2015-08-24 MED ORDER — HYDROCODONE-ACETAMINOPHEN 10-325 MG PO TABS: 1.0000 | ORAL_TABLET | Freq: Four times a day (QID) | ORAL | Status: DC | PRN

## 2015-08-24 NOTE — Telephone Encounter (Signed)
Attempted to reach the pt. Will try again a at a later time.

## 2015-08-24 NOTE — Telephone Encounter (Signed)
I contacted the pt's wife and advised that his rx is ready for pick up. Rx placed upfront.

## 2015-08-24 NOTE — Telephone Encounter (Signed)
I printed  

## 2015-08-24 NOTE — Telephone Encounter (Signed)
Please advise if ok to refill rx. Last prescription was written on 07/27/2015. thanks!

## 2015-08-26 ENCOUNTER — Encounter (HOSPITAL_COMMUNITY)
Admission: RE | Admit: 2015-08-26 | Discharge: 2015-08-26 | Disposition: A | Discharge status: Home / Self Care | Payer: Self-pay | Source: Ambulatory Visit | Attending: Cardiovascular Disease | Admitting: Cardiovascular Disease

## 2015-08-26 DIAGNOSIS — J438 Other emphysema: Secondary | ICD-10-CM | POA: Insufficient documentation

## 2015-08-31 ENCOUNTER — Encounter (HOSPITAL_COMMUNITY)
Admission: RE | Admit: 2015-08-31 | Discharge: 2015-08-31 | Disposition: A | Discharge status: Home / Self Care | Payer: Self-pay | Source: Ambulatory Visit | Attending: Cardiovascular Disease | Admitting: Cardiovascular Disease

## 2015-09-02 ENCOUNTER — Encounter (HOSPITAL_COMMUNITY)
Admission: RE | Admit: 2015-09-02 | Discharge: 2015-09-02 | Disposition: A | Discharge status: Home / Self Care | Payer: Self-pay | Source: Ambulatory Visit | Attending: Cardiovascular Disease | Admitting: Cardiovascular Disease

## 2015-09-05 ENCOUNTER — Encounter (HOSPITAL_COMMUNITY)
Admission: RE | Admit: 2015-09-05 | Discharge: 2015-09-05 | Disposition: A | Discharge status: Home / Self Care | Payer: Self-pay | Source: Ambulatory Visit | Attending: Cardiovascular Disease | Admitting: Cardiovascular Disease

## 2015-09-07 ENCOUNTER — Encounter (HOSPITAL_COMMUNITY)
Admission: RE | Admit: 2015-09-07 | Discharge: 2015-09-07 | Disposition: A | Discharge status: Home / Self Care | Payer: Self-pay | Source: Ambulatory Visit | Attending: Cardiovascular Disease | Admitting: Cardiovascular Disease

## 2015-09-07 ENCOUNTER — Other Ambulatory Visit: Payer: Self-pay | Admitting: Endocrinology

## 2015-09-09 ENCOUNTER — Encounter (HOSPITAL_COMMUNITY)
Admission: RE | Admit: 2015-09-09 | Discharge: 2015-09-09 | Disposition: A | Discharge status: Home / Self Care | Payer: Self-pay | Source: Ambulatory Visit | Attending: Cardiovascular Disease | Admitting: Cardiovascular Disease

## 2015-09-12 ENCOUNTER — Encounter: Payer: Self-pay | Admitting: Endocrinology

## 2015-09-12 ENCOUNTER — Encounter (HOSPITAL_COMMUNITY)
Admission: RE | Admit: 2015-09-12 | Discharge: 2015-09-12 | Disposition: A | Discharge status: Home / Self Care | Payer: Self-pay | Source: Ambulatory Visit | Attending: Cardiovascular Disease | Admitting: Cardiovascular Disease

## 2015-09-12 ENCOUNTER — Ambulatory Visit (INDEPENDENT_AMBULATORY_CARE_PROVIDER_SITE_OTHER): Payer: Medicare Other | Admitting: Endocrinology

## 2015-09-12 VITALS — BP 134/70 | HR 64 | Temp 97.1°F | Ht 63.0 in | Wt 200.0 lb

## 2015-09-12 DIAGNOSIS — Z23 Encounter for immunization: Secondary | ICD-10-CM | POA: Diagnosis not present

## 2015-09-12 NOTE — Progress Notes (Signed)
   Subjective:    Patient ID: [name] Alcon., male    DOB: 1941/04/12, 74 y.o.   MRN: [id number]  HPI  Flu shot only  Review of Systems     Objective:   Physical Exam        Assessment & Plan:

## 2015-09-14 ENCOUNTER — Encounter (HOSPITAL_COMMUNITY)
Admission: RE | Admit: 2015-09-14 | Discharge: 2015-09-14 | Disposition: A | Discharge status: Home / Self Care | Payer: Self-pay | Source: Ambulatory Visit | Attending: Cardiovascular Disease | Admitting: Cardiovascular Disease

## 2015-09-16 ENCOUNTER — Encounter (HOSPITAL_COMMUNITY)
Admission: RE | Admit: 2015-09-16 | Discharge: 2015-09-16 | Disposition: A | Discharge status: Home / Self Care | Payer: Self-pay | Source: Ambulatory Visit | Attending: Cardiovascular Disease | Admitting: Cardiovascular Disease

## 2015-09-19 ENCOUNTER — Encounter (HOSPITAL_COMMUNITY): Payer: Self-pay

## 2015-09-19 ENCOUNTER — Telehealth: Payer: Self-pay | Admitting: Cardiovascular Disease

## 2015-09-19 MED ORDER — ROSUVASTATIN CALCIUM 10 MG PO TABS: 10.0000 mg | ORAL_TABLET | Freq: Every day | ORAL | Status: DC

## 2015-09-19 NOTE — Telephone Encounter (Signed)
Pt called back and reported generic Crestor will cost him $14.39 per month.  He will continue and would like 90 day supply sent to [facility] on [address].  Will send in

## 2015-09-19 NOTE — Telephone Encounter (Signed)
Spoke with pt. He thinks he was on Lipitor in the past when he saw Dr. Olevia Perches.  Does not know why it was stopped but thinks it was probably changed due to lab results.  Chart reviewed and I can see pt was on Vytorin in the past.  He has not checked cost of Crestor since it has gone generic.  I asked him to check with pharmacy about cost and let us know if it is still too expensive.

## 2015-09-19 NOTE — Telephone Encounter (Signed)
NewMessage  Pt calling to speak w/ RN concerning cheaper alternative to Crestor. Please call back and discuss.

## 2015-09-21 ENCOUNTER — Encounter (HOSPITAL_COMMUNITY)
Admission: RE | Admit: 2015-09-21 | Discharge: 2015-09-21 | Disposition: A | Discharge status: Home / Self Care | Payer: Self-pay | Source: Ambulatory Visit | Attending: Cardiovascular Disease | Admitting: Cardiovascular Disease

## 2015-09-21 ENCOUNTER — Other Ambulatory Visit: Payer: Self-pay | Admitting: Endocrinology

## 2015-09-23 ENCOUNTER — Encounter (HOSPITAL_COMMUNITY)
Admission: RE | Admit: 2015-09-23 | Discharge: 2015-09-23 | Disposition: A | Discharge status: Home / Self Care | Payer: Self-pay | Source: Ambulatory Visit | Attending: Cardiovascular Disease | Admitting: Cardiovascular Disease

## 2015-09-26 ENCOUNTER — Encounter (HOSPITAL_COMMUNITY)
Admission: RE | Admit: 2015-09-26 | Discharge: 2015-09-26 | Disposition: A | Discharge status: Home / Self Care | Payer: Self-pay | Source: Ambulatory Visit | Attending: Cardiovascular Disease | Admitting: Cardiovascular Disease

## 2015-09-26 DIAGNOSIS — I214 Non-ST elevation (NSTEMI) myocardial infarction: Secondary | ICD-10-CM | POA: Insufficient documentation

## 2015-09-27 ENCOUNTER — Other Ambulatory Visit: Payer: Self-pay | Admitting: Endocrinology

## 2015-09-27 ENCOUNTER — Telehealth: Payer: Self-pay | Admitting: Endocrinology

## 2015-09-27 MED ORDER — HYDROCODONE-ACETAMINOPHEN 10-325 MG PO TABS
1.0000 | ORAL_TABLET | Freq: Four times a day (QID) | ORAL | Status: DC | PRN
Start: 2015-09-27 — End: 2015-10-25

## 2015-09-27 NOTE — Telephone Encounter (Signed)
Patient advised Rx is ready for pick up. Rx placed up front.  

## 2015-09-27 NOTE — Telephone Encounter (Signed)
i printed 

## 2015-09-27 NOTE — Telephone Encounter (Signed)
Pt is requesting a refill on his hydrocodone. Last refill was 08/24/2015.  Thanks!

## 2015-09-27 NOTE — Telephone Encounter (Signed)
Pt needs refill on pain meds please °

## 2015-09-28 ENCOUNTER — Encounter (HOSPITAL_COMMUNITY)
Admission: RE | Admit: 2015-09-28 | Discharge: 2015-09-28 | Disposition: A | Discharge status: Home / Self Care | Payer: Self-pay | Source: Ambulatory Visit | Attending: Cardiovascular Disease | Admitting: Cardiovascular Disease

## 2015-09-30 ENCOUNTER — Encounter (HOSPITAL_COMMUNITY)
Admission: RE | Admit: 2015-09-30 | Discharge: 2015-09-30 | Disposition: A | Discharge status: Home / Self Care | Payer: Self-pay | Source: Ambulatory Visit | Attending: Cardiovascular Disease | Admitting: Cardiovascular Disease

## 2015-10-03 ENCOUNTER — Encounter (HOSPITAL_COMMUNITY)
Admission: RE | Admit: 2015-10-03 | Discharge: 2015-10-03 | Disposition: A | Discharge status: Home / Self Care | Payer: Self-pay | Source: Ambulatory Visit | Attending: Cardiovascular Disease | Admitting: Cardiovascular Disease

## 2015-10-05 ENCOUNTER — Encounter (HOSPITAL_COMMUNITY)
Admission: RE | Admit: 2015-10-05 | Discharge: 2015-10-05 | Disposition: A | Discharge status: Home / Self Care | Payer: Self-pay | Source: Ambulatory Visit | Attending: Cardiovascular Disease | Admitting: Cardiovascular Disease

## 2015-10-07 ENCOUNTER — Encounter (HOSPITAL_COMMUNITY)
Admission: RE | Admit: 2015-10-07 | Discharge: 2015-10-07 | Disposition: A | Discharge status: Home / Self Care | Payer: Self-pay | Source: Ambulatory Visit | Attending: Cardiovascular Disease | Admitting: Cardiovascular Disease

## 2015-10-10 ENCOUNTER — Encounter (HOSPITAL_COMMUNITY)
Admission: RE | Admit: 2015-10-10 | Discharge: 2015-10-10 | Disposition: A | Discharge status: Home / Self Care | Payer: Self-pay | Source: Ambulatory Visit | Attending: Cardiovascular Disease | Admitting: Cardiovascular Disease

## 2015-10-12 ENCOUNTER — Encounter (HOSPITAL_COMMUNITY)
Admission: RE | Admit: 2015-10-12 | Discharge: 2015-10-12 | Disposition: A | Discharge status: Home / Self Care | Payer: Self-pay | Source: Ambulatory Visit | Attending: Cardiovascular Disease | Admitting: Cardiovascular Disease

## 2015-10-14 ENCOUNTER — Encounter (HOSPITAL_COMMUNITY): Payer: Self-pay

## 2015-10-17 ENCOUNTER — Encounter (HOSPITAL_COMMUNITY)
Admission: RE | Admit: 2015-10-17 | Discharge: 2015-10-17 | Disposition: A | Discharge status: Home / Self Care | Payer: Self-pay | Source: Ambulatory Visit | Attending: Cardiovascular Disease | Admitting: Cardiovascular Disease

## 2015-10-19 ENCOUNTER — Encounter (HOSPITAL_COMMUNITY): Payer: Self-pay

## 2015-10-21 ENCOUNTER — Encounter (HOSPITAL_COMMUNITY): Payer: Self-pay

## 2015-10-24 ENCOUNTER — Encounter (HOSPITAL_COMMUNITY): Payer: Self-pay

## 2015-10-25 ENCOUNTER — Other Ambulatory Visit: Payer: Self-pay

## 2015-10-25 ENCOUNTER — Ambulatory Visit (INDEPENDENT_AMBULATORY_CARE_PROVIDER_SITE_OTHER): Payer: Medicare Other | Admitting: Endocrinology

## 2015-10-25 ENCOUNTER — Encounter: Payer: Self-pay | Admitting: Endocrinology

## 2015-10-25 VITALS — BP 144/76 | HR 71 | Temp 98.4°F | Ht 63.0 in | Wt 200.0 lb

## 2015-10-25 DIAGNOSIS — E1022 Type 1 diabetes mellitus with diabetic chronic kidney disease: Secondary | ICD-10-CM

## 2015-10-25 DIAGNOSIS — R6884 Jaw pain: Secondary | ICD-10-CM | POA: Diagnosis not present

## 2015-10-25 DIAGNOSIS — N189 Chronic kidney disease, unspecified: Secondary | ICD-10-CM

## 2015-10-25 DIAGNOSIS — I255 Ischemic cardiomyopathy: Secondary | ICD-10-CM

## 2015-10-25 LAB — POCT GLYCOSYLATED HEMOGLOBIN (HGB A1C): HEMOGLOBIN A1C: 7.2

## 2015-10-25 MED ORDER — HYDROCODONE-ACETAMINOPHEN 10-325 MG PO TABS: 1.0000 | ORAL_TABLET | Freq: Four times a day (QID) | ORAL | Status: DC | PRN
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
## 2015-10-25 NOTE — Patient Instructions (Addendum)
check your blood sugar twice a day.  vary the time of day when you check, between before the 3 meals, and at bedtime.  also check if you have symptoms of your blood sugar being too high or too low.  please keep a record of the readings and bring it to your next appointment here.  You can write it on any piece of paper.  please call us sooner if your blood sugar goes below 70, or if you have a lot of readings over 200.   Please take 5 units of novolog with a bedtime snack, if you eat one.   Please come back for a "medicare wellness" appointment in 4 months.   Please see an ENT specialist.  you will receive a phone call, about a day and time for an appointment.

## 2015-10-26 ENCOUNTER — Encounter (HOSPITAL_COMMUNITY): Payer: Medicare Other

## 2015-10-26 DIAGNOSIS — I252 Old myocardial infarction: Secondary | ICD-10-CM | POA: Insufficient documentation

## 2015-10-27 ENCOUNTER — Other Ambulatory Visit: Payer: Self-pay | Admitting: Endocrinology

## 2015-10-28 ENCOUNTER — Encounter (HOSPITAL_COMMUNITY): Payer: Self-pay

## 2015-10-31 ENCOUNTER — Encounter (HOSPITAL_COMMUNITY)
Admission: RE | Admit: 2015-10-31 | Discharge: 2015-10-31 | Disposition: A | Discharge status: Home / Self Care | Payer: Self-pay | Source: Ambulatory Visit | Attending: Cardiovascular Disease | Admitting: Cardiovascular Disease

## 2015-11-02 ENCOUNTER — Encounter (HOSPITAL_COMMUNITY)
Admission: RE | Admit: 2015-11-02 | Discharge: 2015-11-02 | Disposition: A | Discharge status: Home / Self Care | Payer: Self-pay | Source: Ambulatory Visit | Attending: Cardiovascular Disease | Admitting: Cardiovascular Disease

## 2015-11-04 ENCOUNTER — Encounter (HOSPITAL_COMMUNITY)
Admission: RE | Admit: 2015-11-04 | Discharge: 2015-11-04 | Disposition: A | Discharge status: Home / Self Care | Payer: Self-pay | Source: Ambulatory Visit | Attending: Cardiovascular Disease | Admitting: Cardiovascular Disease

## 2015-11-07 ENCOUNTER — Encounter (HOSPITAL_COMMUNITY)
Admission: RE | Admit: 2015-11-07 | Discharge: 2015-11-07 | Disposition: A | Discharge status: Home / Self Care | Payer: Self-pay | Source: Ambulatory Visit | Attending: Cardiovascular Disease | Admitting: Cardiovascular Disease

## 2015-11-09 ENCOUNTER — Encounter (HOSPITAL_COMMUNITY)
Admission: RE | Admit: 2015-11-09 | Discharge: 2015-11-09 | Disposition: A | Discharge status: Home / Self Care | Payer: Self-pay | Source: Ambulatory Visit | Attending: Cardiovascular Disease | Admitting: Cardiovascular Disease

## 2015-11-11 ENCOUNTER — Encounter (HOSPITAL_COMMUNITY)
Admission: RE | Admit: 2015-11-11 | Discharge: 2015-11-11 | Disposition: A | Discharge status: Home / Self Care | Payer: Self-pay | Source: Ambulatory Visit | Attending: Cardiovascular Disease | Admitting: Cardiovascular Disease

## 2015-11-14 ENCOUNTER — Encounter (HOSPITAL_COMMUNITY)
Admission: RE | Admit: 2015-11-14 | Discharge: 2015-11-14 | Disposition: A | Discharge status: Home / Self Care | Payer: Self-pay | Source: Ambulatory Visit | Attending: Cardiovascular Disease | Admitting: Cardiovascular Disease

## 2015-11-16 ENCOUNTER — Encounter (HOSPITAL_COMMUNITY)
Admission: RE | Admit: 2015-11-16 | Discharge: 2015-11-16 | Disposition: A | Discharge status: Home / Self Care | Payer: Self-pay | Source: Ambulatory Visit | Attending: Cardiovascular Disease | Admitting: Cardiovascular Disease

## 2015-11-21 ENCOUNTER — Encounter (HOSPITAL_COMMUNITY)
Admission: RE | Admit: 2015-11-21 | Discharge: 2015-11-21 | Disposition: A | Discharge status: Home / Self Care | Payer: Self-pay | Source: Ambulatory Visit | Attending: Cardiovascular Disease | Admitting: Cardiovascular Disease

## 2015-11-22 DIAGNOSIS — H9193 Unspecified hearing loss, bilateral: Secondary | ICD-10-CM | POA: Diagnosis not present

## 2015-11-22 DIAGNOSIS — M542 Cervicalgia: Secondary | ICD-10-CM | POA: Diagnosis not present

## 2015-11-22 DIAGNOSIS — Z8673 Personal history of transient ischemic attack (TIA), and cerebral infarction without residual deficits: Secondary | ICD-10-CM | POA: Diagnosis not present

## 2015-11-23 ENCOUNTER — Other Ambulatory Visit: Payer: Self-pay | Admitting: Endocrinology

## 2015-11-23 ENCOUNTER — Encounter (HOSPITAL_COMMUNITY)
Admission: RE | Admit: 2015-11-23 | Discharge: 2015-11-23 | Disposition: A | Discharge status: Home / Self Care | Payer: Self-pay | Source: Ambulatory Visit | Attending: Cardiovascular Disease | Admitting: Cardiovascular Disease

## 2015-11-24 ENCOUNTER — Telehealth: Payer: Self-pay | Admitting: Endocrinology

## 2015-11-24 DIAGNOSIS — Z9884 Bariatric surgery status: Secondary | ICD-10-CM | POA: Diagnosis not present

## 2015-11-24 DIAGNOSIS — Z6841 Body Mass Index (BMI) 40.0 and over, adult: Secondary | ICD-10-CM | POA: Diagnosis not present

## 2015-11-24 MED ORDER — HYDROCODONE-ACETAMINOPHEN 10-325 MG PO TABS: 1.0000 | ORAL_TABLET | Freq: Four times a day (QID) | ORAL | Status: DC | PRN

## 2015-11-24 NOTE — Telephone Encounter (Signed)
See note below   Thanks

## 2015-11-24 NOTE — Telephone Encounter (Signed)
I contacted the pt and advised his rx is ready for pick up. Rx placed up front.

## 2015-11-24 NOTE — Telephone Encounter (Signed)
i printed 

## 2015-11-24 NOTE — Telephone Encounter (Signed)
Patient called stating that he would like a refill on his pain medication    Thank you

## 2015-11-25 ENCOUNTER — Encounter (HOSPITAL_COMMUNITY)
Admission: RE | Admit: 2015-11-25 | Discharge: 2015-11-25 | Disposition: A | Discharge status: Home / Self Care | Payer: Self-pay | Source: Ambulatory Visit | Attending: Cardiovascular Disease | Admitting: Cardiovascular Disease

## 2015-11-25 ENCOUNTER — Ambulatory Visit (INDEPENDENT_AMBULATORY_CARE_PROVIDER_SITE_OTHER): Payer: Medicare Other | Admitting: Emergency Medicine

## 2015-11-25 ENCOUNTER — Encounter: Payer: Self-pay | Admitting: Emergency Medicine

## 2015-11-25 VITALS — BP 138/82 | HR 76 | Ht 63.0 in | Wt 199.0 lb

## 2015-11-25 DIAGNOSIS — I255 Ischemic cardiomyopathy: Secondary | ICD-10-CM

## 2015-11-25 DIAGNOSIS — I252 Old myocardial infarction: Secondary | ICD-10-CM | POA: Insufficient documentation

## 2015-11-25 DIAGNOSIS — J438 Other emphysema: Secondary | ICD-10-CM

## 2015-11-25 DIAGNOSIS — J309 Allergic rhinitis, unspecified: Secondary | ICD-10-CM | POA: Diagnosis not present

## 2015-11-25 NOTE — Assessment & Plan Note (Signed)
Severe COPD. Appears to be fairly well compensated at this time although he did experience some mucous and cough this am, needed his SABA for first time in months. Need to be aggressive with allergy control to avoid an AE.
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
## 2015-11-25 NOTE — Patient Instructions (Addendum)
Please continue your Spiriva and Symbicort as you are taking it.  Continue to keep albuterol available to use as needed.  Continue zyrtec Use astelin as needed for nasal congestion.  Continue your Cardiopulmonary Rehab.  Follow with Dr Lamonte Sakai in 6 months or sooner if you have any problems

## 2015-11-25 NOTE — Assessment & Plan Note (Signed)
He has only been using Zyrtec and Astelin when necessary. I have asked him to restart these on a schedule to get better control of his rhinitis, hopefully avoid an acute exacerbation of his COPD

## 2015-11-28 ENCOUNTER — Encounter (HOSPITAL_COMMUNITY)
Admission: RE | Admit: 2015-11-28 | Discharge: 2015-11-28 | Disposition: A | Discharge status: Home / Self Care | Payer: Self-pay | Source: Ambulatory Visit | Attending: Cardiovascular Disease | Admitting: Cardiovascular Disease

## 2015-11-30 ENCOUNTER — Encounter (HOSPITAL_COMMUNITY)
Admission: RE | Admit: 2015-11-30 | Discharge: 2015-11-30 | Disposition: A | Discharge status: Home / Self Care | Payer: Self-pay | Source: Ambulatory Visit | Attending: Cardiovascular Disease | Admitting: Cardiovascular Disease

## 2015-12-01 ENCOUNTER — Other Ambulatory Visit: Payer: Self-pay | Admitting: Cardiovascular Disease

## 2015-12-01 ENCOUNTER — Other Ambulatory Visit: Payer: Self-pay | Admitting: Endocrinology

## 2015-12-02 ENCOUNTER — Encounter (HOSPITAL_COMMUNITY)
Admission: RE | Admit: 2015-12-02 | Discharge: 2015-12-02 | Disposition: A | Discharge status: Home / Self Care | Payer: Self-pay | Source: Ambulatory Visit | Attending: Cardiovascular Disease | Admitting: Cardiovascular Disease

## 2015-12-05 ENCOUNTER — Encounter (HOSPITAL_COMMUNITY): Payer: Self-pay

## 2015-12-07 ENCOUNTER — Encounter (HOSPITAL_COMMUNITY): Payer: Self-pay

## 2015-12-07 ENCOUNTER — Ambulatory Visit: Payer: Medicare Other | Admitting: Endocrinology

## 2015-12-09 ENCOUNTER — Encounter (HOSPITAL_COMMUNITY): Payer: Self-pay

## 2015-12-12 ENCOUNTER — Encounter: Payer: Self-pay | Admitting: Endocrinology

## 2015-12-12 ENCOUNTER — Ambulatory Visit (INDEPENDENT_AMBULATORY_CARE_PROVIDER_SITE_OTHER): Payer: Medicare Other | Admitting: Endocrinology

## 2015-12-12 ENCOUNTER — Encounter (HOSPITAL_COMMUNITY)
Admission: RE | Admit: 2015-12-12 | Discharge: 2015-12-12 | Disposition: A | Discharge status: Home / Self Care | Payer: Self-pay | Source: Ambulatory Visit | Attending: Cardiovascular Disease | Admitting: Cardiovascular Disease

## 2015-12-12 VITALS — BP 128/72 | HR 67 | Temp 97.9°F | Ht 63.0 in | Wt 199.0 lb

## 2015-12-12 DIAGNOSIS — Z794 Long term (current) use of insulin: Secondary | ICD-10-CM

## 2015-12-12 DIAGNOSIS — E119 Type 2 diabetes mellitus without complications: Secondary | ICD-10-CM

## 2015-12-12 MED ORDER — HYDROCODONE-ACETAMINOPHEN 10-325 MG PO TABS: 1.0000 | ORAL_TABLET | Freq: Four times a day (QID) | ORAL | Status: DC | PRN

## 2015-12-12 NOTE — Progress Notes (Signed)
   Subjective:    Patient ID: [name]., male    DOB: Jan 24, 1941, 74 y.o.   MRN: [id number]  HPI (no visit--to soon).     Review of Systems     Objective:   Physical Exam        Assessment & Plan:

## 2015-12-12 NOTE — Patient Instructions (Addendum)
Please come back for a "medicare wellness" appointment in 3 months.

## 2015-12-14 ENCOUNTER — Encounter (HOSPITAL_COMMUNITY)
Admission: RE | Admit: 2015-12-14 | Discharge: 2015-12-14 | Disposition: A | Discharge status: Home / Self Care | Payer: Self-pay | Source: Ambulatory Visit | Attending: Cardiovascular Disease | Admitting: Cardiovascular Disease

## 2015-12-16 ENCOUNTER — Encounter (HOSPITAL_COMMUNITY)
Admission: RE | Admit: 2015-12-16 | Discharge: 2015-12-16 | Disposition: A | Discharge status: Home / Self Care | Payer: Self-pay | Source: Ambulatory Visit | Attending: Cardiovascular Disease | Admitting: Cardiovascular Disease

## 2015-12-17 ENCOUNTER — Other Ambulatory Visit: Payer: Self-pay | Admitting: Endocrinology

## 2015-12-20 ENCOUNTER — Other Ambulatory Visit: Payer: Self-pay | Admitting: *Deleted

## 2015-12-20 MED ORDER — CALCITRIOL 0.25 MCG PO CAPS: 0.2500 ug | ORAL_CAPSULE | Freq: Every day | ORAL | Status: DC

## 2015-12-20 MED ORDER — ISOSORBIDE MONONITRATE ER 60 MG PO TB24: 60.0000 mg | ORAL_TABLET | Freq: Every day | ORAL | Status: DC

## 2015-12-20 MED ORDER — CITALOPRAM HYDROBROMIDE 20 MG PO TABS: ORAL_TABLET | ORAL | Status: DC

## 2015-12-21 ENCOUNTER — Encounter (HOSPITAL_COMMUNITY): Payer: Self-pay

## 2015-12-21 ENCOUNTER — Encounter: Payer: Self-pay | Admitting: *Deleted

## 2015-12-23 ENCOUNTER — Encounter (HOSPITAL_COMMUNITY)
Admission: RE | Admit: 2015-12-23 | Discharge: 2015-12-23 | Disposition: A | Discharge status: Home / Self Care | Payer: Self-pay | Source: Ambulatory Visit | Attending: Cardiovascular Disease | Admitting: Cardiovascular Disease

## 2015-12-28 ENCOUNTER — Encounter (HOSPITAL_COMMUNITY): Payer: Self-pay

## 2015-12-28 DIAGNOSIS — I214 Non-ST elevation (NSTEMI) myocardial infarction: Secondary | ICD-10-CM | POA: Insufficient documentation

## 2015-12-30 ENCOUNTER — Encounter (HOSPITAL_COMMUNITY): Payer: Self-pay

## 2016-01-02 ENCOUNTER — Encounter (HOSPITAL_COMMUNITY): Payer: Self-pay

## 2016-01-04 ENCOUNTER — Encounter (HOSPITAL_COMMUNITY): Payer: Self-pay

## 2016-01-06 ENCOUNTER — Encounter (HOSPITAL_COMMUNITY): Payer: Self-pay

## 2016-01-09 ENCOUNTER — Encounter (HOSPITAL_COMMUNITY)
Admission: RE | Admit: 2016-01-09 | Discharge: 2016-01-09 | Disposition: A | Discharge status: Home / Self Care | Payer: Self-pay | Source: Ambulatory Visit | Attending: Cardiovascular Disease | Admitting: Cardiovascular Disease

## 2016-01-11 ENCOUNTER — Encounter (HOSPITAL_COMMUNITY)
Admission: RE | Admit: 2016-01-11 | Discharge: 2016-01-11 | Disposition: A | Discharge status: Home / Self Care | Payer: Self-pay | Source: Ambulatory Visit | Attending: Cardiovascular Disease | Admitting: Cardiovascular Disease

## 2016-01-13 ENCOUNTER — Encounter (HOSPITAL_COMMUNITY): Payer: Self-pay

## 2016-01-16 ENCOUNTER — Encounter (HOSPITAL_COMMUNITY)
Admission: RE | Admit: 2016-01-16 | Discharge: 2016-01-16 | Disposition: A | Discharge status: Home / Self Care | Payer: Self-pay | Source: Ambulatory Visit | Attending: Cardiovascular Disease | Admitting: Cardiovascular Disease

## 2016-01-18 ENCOUNTER — Encounter (HOSPITAL_COMMUNITY)
Admission: RE | Admit: 2016-01-18 | Discharge: 2016-01-18 | Disposition: A | Discharge status: Home / Self Care | Payer: Self-pay | Source: Ambulatory Visit | Attending: Cardiovascular Disease | Admitting: Cardiovascular Disease

## 2016-01-20 ENCOUNTER — Encounter (HOSPITAL_COMMUNITY)
Admission: RE | Admit: 2016-01-20 | Discharge: 2016-01-20 | Disposition: A | Discharge status: Home / Self Care | Payer: Self-pay | Source: Ambulatory Visit | Attending: Cardiovascular Disease | Admitting: Cardiovascular Disease

## 2016-01-22 ENCOUNTER — Other Ambulatory Visit: Payer: Self-pay | Admitting: Endocrinology

## 2016-01-23 ENCOUNTER — Encounter (HOSPITAL_COMMUNITY)
Admission: RE | Admit: 2016-01-23 | Discharge: 2016-01-23 | Disposition: A | Discharge status: Home / Self Care | Payer: Self-pay | Source: Ambulatory Visit | Attending: Cardiovascular Disease | Admitting: Cardiovascular Disease

## 2016-01-25 ENCOUNTER — Encounter (HOSPITAL_COMMUNITY)
Admission: RE | Admit: 2016-01-25 | Discharge: 2016-01-25 | Disposition: A | Discharge status: Home / Self Care | Payer: Self-pay | Source: Ambulatory Visit | Attending: Cardiovascular Disease | Admitting: Cardiovascular Disease

## 2016-01-25 DIAGNOSIS — I214 Non-ST elevation (NSTEMI) myocardial infarction: Secondary | ICD-10-CM | POA: Insufficient documentation

## 2016-01-26 ENCOUNTER — Telehealth: Payer: Self-pay | Admitting: Endocrinology

## 2016-01-26 MED ORDER — HYDROCODONE-ACETAMINOPHEN 10-325 MG PO TABS: 1.0000 | ORAL_TABLET | Freq: Four times a day (QID) | ORAL | Status: DC | PRN

## 2016-01-26 NOTE — Telephone Encounter (Signed)
Pt needs refills for pain meds please

## 2016-01-26 NOTE — Telephone Encounter (Signed)
Pt notified rx is ready for pick up. Rx placed up front.  

## 2016-01-26 NOTE — Telephone Encounter (Signed)
i printed 

## 2016-01-26 NOTE — Telephone Encounter (Signed)
See note below and please advise, Thanks! 

## 2016-01-27 ENCOUNTER — Encounter (HOSPITAL_COMMUNITY)
Admission: RE | Admit: 2016-01-27 | Discharge: 2016-01-27 | Disposition: A | Discharge status: Home / Self Care | Payer: Self-pay | Source: Ambulatory Visit | Attending: Cardiovascular Disease | Admitting: Cardiovascular Disease

## 2016-01-30 ENCOUNTER — Encounter (HOSPITAL_COMMUNITY)
Admission: RE | Admit: 2016-01-30 | Discharge: 2016-01-30 | Disposition: A | Discharge status: Home / Self Care | Payer: Self-pay | Source: Ambulatory Visit | Attending: Cardiovascular Disease | Admitting: Cardiovascular Disease

## 2016-02-01 ENCOUNTER — Encounter (HOSPITAL_COMMUNITY)
Admission: RE | Admit: 2016-02-01 | Discharge: 2016-02-01 | Disposition: A | Discharge status: Home / Self Care | Payer: Self-pay | Source: Ambulatory Visit | Attending: Cardiovascular Disease | Admitting: Cardiovascular Disease

## 2016-02-03 ENCOUNTER — Encounter (HOSPITAL_COMMUNITY)
Admission: RE | Admit: 2016-02-03 | Discharge: 2016-02-03 | Disposition: A | Discharge status: Home / Self Care | Payer: Self-pay | Source: Ambulatory Visit | Attending: Cardiovascular Disease | Admitting: Cardiovascular Disease

## 2016-02-06 ENCOUNTER — Encounter (HOSPITAL_COMMUNITY)
Admission: RE | Admit: 2016-02-06 | Discharge: 2016-02-06 | Disposition: A | Discharge status: Home / Self Care | Payer: Self-pay | Source: Ambulatory Visit | Attending: Cardiovascular Disease | Admitting: Cardiovascular Disease

## 2016-02-08 ENCOUNTER — Encounter (HOSPITAL_COMMUNITY)
Admission: RE | Admit: 2016-02-08 | Discharge: 2016-02-08 | Disposition: A | Discharge status: Home / Self Care | Payer: Self-pay | Source: Ambulatory Visit | Attending: Cardiovascular Disease | Admitting: Cardiovascular Disease

## 2016-02-10 ENCOUNTER — Encounter (HOSPITAL_COMMUNITY): Payer: Self-pay

## 2016-02-13 ENCOUNTER — Encounter (HOSPITAL_COMMUNITY): Payer: Self-pay

## 2016-02-15 ENCOUNTER — Encounter (HOSPITAL_COMMUNITY): Payer: Self-pay

## 2016-02-17 ENCOUNTER — Encounter (HOSPITAL_COMMUNITY)
Admission: RE | Admit: 2016-02-17 | Discharge: 2016-02-17 | Disposition: A | Discharge status: Home / Self Care | Payer: Self-pay | Source: Ambulatory Visit | Attending: Cardiovascular Disease | Admitting: Cardiovascular Disease

## 2016-02-20 ENCOUNTER — Encounter (HOSPITAL_COMMUNITY)
Admission: RE | Admit: 2016-02-20 | Discharge: 2016-02-20 | Disposition: A | Discharge status: Home / Self Care | Payer: Self-pay | Source: Ambulatory Visit | Attending: Cardiovascular Disease | Admitting: Cardiovascular Disease

## 2016-02-22 ENCOUNTER — Encounter (HOSPITAL_COMMUNITY)
Admission: RE | Admit: 2016-02-22 | Discharge: 2016-02-22 | Disposition: A | Discharge status: Home / Self Care | Payer: Self-pay | Source: Ambulatory Visit | Attending: Cardiovascular Disease | Admitting: Cardiovascular Disease

## 2016-02-22 ENCOUNTER — Ambulatory Visit (INDEPENDENT_AMBULATORY_CARE_PROVIDER_SITE_OTHER): Payer: Medicare Other | Admitting: Endocrinology

## 2016-02-22 ENCOUNTER — Encounter: Payer: Self-pay | Admitting: Endocrinology

## 2016-02-22 VITALS — BP 122/68 | HR 65 | Temp 97.9°F | Ht 63.0 in | Wt 200.0 lb

## 2016-02-22 DIAGNOSIS — M109 Gout, unspecified: Secondary | ICD-10-CM | POA: Diagnosis not present

## 2016-02-22 DIAGNOSIS — I214 Non-ST elevation (NSTEMI) myocardial infarction: Secondary | ICD-10-CM | POA: Insufficient documentation

## 2016-02-22 DIAGNOSIS — N183 Chronic kidney disease, stage 3 unspecified: Secondary | ICD-10-CM

## 2016-02-22 DIAGNOSIS — N189 Chronic kidney disease, unspecified: Secondary | ICD-10-CM | POA: Diagnosis not present

## 2016-02-22 DIAGNOSIS — E1022 Type 1 diabetes mellitus with diabetic chronic kidney disease: Secondary | ICD-10-CM | POA: Diagnosis not present

## 2016-02-22 DIAGNOSIS — D509 Iron deficiency anemia, unspecified: Secondary | ICD-10-CM | POA: Diagnosis not present

## 2016-02-22 DIAGNOSIS — Z125 Encounter for screening for malignant neoplasm of prostate: Secondary | ICD-10-CM

## 2016-02-22 DIAGNOSIS — E1122 Type 2 diabetes mellitus with diabetic chronic kidney disease: Secondary | ICD-10-CM

## 2016-02-22 DIAGNOSIS — Z794 Long term (current) use of insulin: Secondary | ICD-10-CM

## 2016-02-22 DIAGNOSIS — N259 Disorder resulting from impaired renal tubular function, unspecified: Secondary | ICD-10-CM | POA: Diagnosis not present

## 2016-02-22 DIAGNOSIS — E1151 Type 2 diabetes mellitus with diabetic peripheral angiopathy without gangrene: Secondary | ICD-10-CM

## 2016-02-22 DIAGNOSIS — E785 Hyperlipidemia, unspecified: Secondary | ICD-10-CM

## 2016-02-22 LAB — URINALYSIS, ROUTINE W REFLEX MICROSCOPIC
BILIRUBIN URINE: NEGATIVE
HGB URINE DIPSTICK: NEGATIVE
Ketones, ur: NEGATIVE
Leukocytes, UA: NEGATIVE
Nitrite: NEGATIVE
Specific Gravity, Urine: 1.01 (ref 1.000–1.030)
TOTAL PROTEIN, URINE-UPE24: 30 — AB
Urine Glucose: NEGATIVE
Urobilinogen, UA: 0.2 (ref 0.0–1.0)
pH: 5.5 (ref 5.0–8.0)

## 2016-02-22 LAB — CBC WITH DIFFERENTIAL/PLATELET
Basophils Absolute: 0 10*3/uL (ref 0.0–0.1)
Basophils Relative: 0.5 % (ref 0.0–3.0)
EOS ABS: 0.2 10*3/uL (ref 0.0–0.7)
EOS PCT: 2.6 % (ref 0.0–5.0)
HCT: 40 % (ref 39.0–52.0)
HEMOGLOBIN: 13.1 g/dL (ref 13.0–17.0)
LYMPHS PCT: 16.8 % (ref 12.0–46.0)
Lymphs Abs: 1.3 10*3/uL (ref 0.7–4.0)
MCHC: 32.8 g/dL (ref 30.0–36.0)
MCV: 88.4 fl (ref 78.0–100.0)
MONO ABS: 0.6 10*3/uL (ref 0.1–1.0)
Monocytes Relative: 8.1 % (ref 3.0–12.0)
Neutro Abs: 5.6 10*3/uL (ref 1.4–7.7)
Neutrophils Relative %: 72 % (ref 43.0–77.0)
Platelets: 296 10*3/uL (ref 150.0–400.0)
RBC: 4.52 Mil/uL (ref 4.22–5.81)
RDW: 15.9 % — ABNORMAL HIGH (ref 11.5–15.5)
WBC: 7.7 10*3/uL (ref 4.0–10.5)

## 2016-02-22 LAB — BASIC METABOLIC PANEL
BUN: 35 mg/dL — AB (ref 6–23)
CALCIUM: 8.9 mg/dL (ref 8.4–10.5)
CO2: 31 mEq/L (ref 19–32)
CREATININE: 1.34 mg/dL (ref 0.40–1.50)
Chloride: 97 mEq/L (ref 96–112)
GFR: 55.41 mL/min — AB (ref >60.00)
GLUCOSE: 136 mg/dL — AB (ref 70–99)
Potassium: 3.8 mEq/L (ref 3.5–5.1)
Sodium: 135 mEq/L (ref 135–145)

## 2016-02-22 LAB — TSH: TSH: 1.31 u[IU]/mL (ref 0.35–4.50)

## 2016-02-22 LAB — PSA, MEDICARE: PSA: 0.52 ng/ml (ref 0.10–4.00)

## 2016-02-22 LAB — LIPID PANEL
CHOLESTEROL: 147 mg/dL (ref 0–200)
HDL: 47.7 mg/dL (ref >39.00)
LDL CALC: 85 mg/dL (ref 0–99)
NonHDL: 99.36
TRIGLYCERIDES: 73 mg/dL (ref 0.0–149.0)
Total CHOL/HDL Ratio: 3
VLDL: 14.6 mg/dL (ref 0.0–40.0)

## 2016-02-22 LAB — URIC ACID: Uric Acid, Serum: 4.5 mg/dL (ref 4.0–7.8)

## 2016-02-22 LAB — HEPATIC FUNCTION PANEL
ALBUMIN: 3.6 g/dL (ref 3.5–5.2)
ALK PHOS: 76 U/L (ref 39–117)
ALT: 11 U/L (ref 0–53)
AST: 11 U/L (ref 0–37)
BILIRUBIN TOTAL: 0.4 mg/dL (ref 0.2–1.2)
Bilirubin, Direct: 0.1 mg/dL (ref 0.0–0.3)
Total Protein: 6 g/dL (ref 6.0–8.3)

## 2016-02-22 LAB — POCT GLYCOSYLATED HEMOGLOBIN (HGB A1C): Hemoglobin A1C: 6.9

## 2016-02-22 LAB — IBC PANEL
Iron: 48 ug/dL (ref 42–165)
SATURATION RATIOS: 15.8 % — AB (ref 20.0–50.0)
Transferrin: 217 mg/dL (ref 212.0–360.0)

## 2016-02-22 MED ORDER — HYDROCODONE-ACETAMINOPHEN 10-325 MG PO TABS: 1.0000 | ORAL_TABLET | Freq: Four times a day (QID) | ORAL | Status: DC | PRN

## 2016-02-22 NOTE — Patient Instructions (Addendum)
blood tests are requested for you today.  We'll let you know about the results. please consider these measures for your health:  minimize alcohol.  do not use tobacco products.  have a colonoscopy at least every 10 years from age 75.  keep firearms safely stored.  always use seat belts.  have working smoke alarms in your home.  see an eye doctor and dentist regularly.  never drive under the influence of alcohol or drugs (including prescription drugs).  those with fair skin should take precautions against the sun. it is critically important to prevent falling down (keep floor areas well-lit, dry, and free of loose objects.  If you have a cane, walker, or wheelchair, you should use it, even for short trips around the house.  Also, try not to rush) Please come back for a follow-up appointment in 6 months.
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
## 2016-02-22 NOTE — Progress Notes (Signed)
we discussed code status.  pt requests full code, but would not want to be started or maintained on artificial life-support measures if there was not a reasonable chance of recovery 

## 2016-02-23 DIAGNOSIS — E119 Type 2 diabetes mellitus without complications: Secondary | ICD-10-CM | POA: Insufficient documentation

## 2016-02-24 ENCOUNTER — Encounter (HOSPITAL_COMMUNITY)
Admission: RE | Admit: 2016-02-24 | Discharge: 2016-02-24 | Disposition: A | Discharge status: Home / Self Care | Payer: Self-pay | Source: Ambulatory Visit | Attending: Cardiovascular Disease | Admitting: Cardiovascular Disease

## 2016-02-25 ENCOUNTER — Other Ambulatory Visit: Payer: Self-pay | Admitting: Endocrinology

## 2016-02-27 ENCOUNTER — Encounter (HOSPITAL_COMMUNITY): Payer: Self-pay

## 2016-02-29 ENCOUNTER — Encounter (HOSPITAL_COMMUNITY): Payer: Self-pay

## 2016-03-02 ENCOUNTER — Encounter (HOSPITAL_COMMUNITY): Payer: Self-pay

## 2016-03-05 ENCOUNTER — Encounter (HOSPITAL_COMMUNITY)
Admission: RE | Admit: 2016-03-05 | Discharge: 2016-03-05 | Disposition: A | Discharge status: Home / Self Care | Payer: Self-pay | Source: Ambulatory Visit | Attending: Cardiovascular Disease | Admitting: Cardiovascular Disease

## 2016-03-07 ENCOUNTER — Encounter (HOSPITAL_COMMUNITY)
Admission: RE | Admit: 2016-03-07 | Discharge: 2016-03-07 | Disposition: A | Discharge status: Home / Self Care | Payer: Self-pay | Source: Ambulatory Visit | Attending: Cardiovascular Disease | Admitting: Cardiovascular Disease

## 2016-03-09 ENCOUNTER — Encounter (HOSPITAL_COMMUNITY): Payer: Self-pay

## 2016-03-12 ENCOUNTER — Encounter (HOSPITAL_COMMUNITY)
Admission: RE | Admit: 2016-03-12 | Discharge: 2016-03-12 | Disposition: A | Discharge status: Home / Self Care | Payer: Self-pay | Source: Ambulatory Visit | Attending: Cardiovascular Disease | Admitting: Cardiovascular Disease

## 2016-03-14 ENCOUNTER — Encounter (HOSPITAL_COMMUNITY)
Admission: RE | Admit: 2016-03-14 | Discharge: 2016-03-14 | Disposition: A | Discharge status: Home / Self Care | Payer: Self-pay | Source: Ambulatory Visit | Attending: Cardiovascular Disease | Admitting: Cardiovascular Disease

## 2016-03-15 ENCOUNTER — Other Ambulatory Visit: Payer: Self-pay | Admitting: Internal Medicine

## 2016-03-16 ENCOUNTER — Encounter (HOSPITAL_COMMUNITY)
Admission: RE | Admit: 2016-03-16 | Discharge: 2016-03-16 | Disposition: A | Discharge status: Home / Self Care | Payer: Self-pay | Source: Ambulatory Visit | Attending: Cardiovascular Disease | Admitting: Cardiovascular Disease

## 2016-03-19 ENCOUNTER — Encounter (HOSPITAL_COMMUNITY): Payer: Self-pay

## 2016-03-21 ENCOUNTER — Encounter (HOSPITAL_COMMUNITY)
Admission: RE | Admit: 2016-03-21 | Discharge: 2016-03-21 | Disposition: A | Discharge status: Home / Self Care | Payer: Self-pay | Source: Ambulatory Visit | Attending: Cardiovascular Disease | Admitting: Cardiovascular Disease

## 2016-03-23 ENCOUNTER — Encounter (HOSPITAL_COMMUNITY)
Admission: RE | Admit: 2016-03-23 | Discharge: 2016-03-23 | Disposition: A | Discharge status: Home / Self Care | Payer: Self-pay | Source: Ambulatory Visit | Attending: Cardiovascular Disease | Admitting: Cardiovascular Disease

## 2016-03-26 ENCOUNTER — Encounter (HOSPITAL_COMMUNITY)
Admission: RE | Admit: 2016-03-26 | Discharge: 2016-03-26 | Disposition: A | Discharge status: Home / Self Care | Payer: Self-pay | Source: Ambulatory Visit | Attending: Cardiovascular Disease | Admitting: Cardiovascular Disease

## 2016-03-26 DIAGNOSIS — I214 Non-ST elevation (NSTEMI) myocardial infarction: Secondary | ICD-10-CM | POA: Insufficient documentation

## 2016-03-28 ENCOUNTER — Telehealth: Payer: Self-pay | Admitting: Endocrinology

## 2016-03-28 ENCOUNTER — Encounter (HOSPITAL_COMMUNITY)
Admission: RE | Admit: 2016-03-28 | Discharge: 2016-03-28 | Disposition: A | Discharge status: Home / Self Care | Payer: Self-pay | Source: Ambulatory Visit | Attending: Cardiovascular Disease | Admitting: Cardiovascular Disease

## 2016-03-28 MED ORDER — HYDROCODONE-ACETAMINOPHEN 10-325 MG PO TABS: 1.0000 | ORAL_TABLET | Freq: Four times a day (QID) | ORAL | Status: DC | PRN

## 2016-03-28 NOTE — Telephone Encounter (Signed)
i printed 

## 2016-03-28 NOTE — Telephone Encounter (Signed)
See note below and please advise, Thanks! 

## 2016-03-28 NOTE — Telephone Encounter (Signed)
Patient need a refill of hydrocodone. °

## 2016-03-28 NOTE — Telephone Encounter (Signed)
I contacted the pt and left a vm advising the rx is ready for pick up. Rx placed up front.

## 2016-03-30 ENCOUNTER — Encounter (HOSPITAL_COMMUNITY)
Admission: RE | Admit: 2016-03-30 | Discharge: 2016-03-30 | Disposition: A | Discharge status: Home / Self Care | Payer: Self-pay | Source: Ambulatory Visit | Attending: Cardiovascular Disease | Admitting: Cardiovascular Disease

## 2016-03-31 ENCOUNTER — Other Ambulatory Visit: Payer: Self-pay | Admitting: Cardiovascular Disease

## 2016-04-02 ENCOUNTER — Encounter (HOSPITAL_COMMUNITY)
Admission: RE | Admit: 2016-04-02 | Discharge: 2016-04-02 | Disposition: A | Discharge status: Home / Self Care | Payer: Self-pay | Source: Ambulatory Visit | Attending: Cardiovascular Disease | Admitting: Cardiovascular Disease

## 2016-04-04 ENCOUNTER — Encounter (HOSPITAL_COMMUNITY)
Admission: RE | Admit: 2016-04-04 | Discharge: 2016-04-04 | Disposition: A | Discharge status: Home / Self Care | Payer: Self-pay | Source: Ambulatory Visit | Attending: Cardiovascular Disease | Admitting: Cardiovascular Disease

## 2016-04-06 ENCOUNTER — Encounter (HOSPITAL_COMMUNITY)
Admission: RE | Admit: 2016-04-06 | Discharge: 2016-04-06 | Disposition: A | Discharge status: Home / Self Care | Payer: Self-pay | Source: Ambulatory Visit | Attending: Cardiovascular Disease | Admitting: Cardiovascular Disease

## 2016-04-09 ENCOUNTER — Encounter (HOSPITAL_COMMUNITY)
Admission: RE | Admit: 2016-04-09 | Discharge: 2016-04-09 | Disposition: A | Discharge status: Home / Self Care | Payer: Self-pay | Source: Ambulatory Visit | Attending: Cardiovascular Disease | Admitting: Cardiovascular Disease

## 2016-04-11 ENCOUNTER — Encounter (HOSPITAL_COMMUNITY): Payer: Self-pay

## 2016-04-13 ENCOUNTER — Other Ambulatory Visit: Payer: Self-pay | Admitting: Cardiovascular Disease

## 2016-04-13 ENCOUNTER — Encounter (HOSPITAL_COMMUNITY): Payer: Self-pay

## 2016-04-16 ENCOUNTER — Encounter (HOSPITAL_COMMUNITY): Payer: Self-pay

## 2016-04-18 ENCOUNTER — Encounter (HOSPITAL_COMMUNITY)
Admission: RE | Admit: 2016-04-18 | Discharge: 2016-04-18 | Disposition: A | Discharge status: Home / Self Care | Payer: Self-pay | Source: Ambulatory Visit | Attending: Cardiovascular Disease | Admitting: Cardiovascular Disease

## 2016-04-19 ENCOUNTER — Other Ambulatory Visit: Payer: Self-pay | Admitting: Endocrinology

## 2016-04-20 ENCOUNTER — Ambulatory Visit (INDEPENDENT_AMBULATORY_CARE_PROVIDER_SITE_OTHER): Payer: Medicare Other | Admitting: Emergency Medicine

## 2016-04-20 ENCOUNTER — Encounter (HOSPITAL_COMMUNITY)
Admission: RE | Admit: 2016-04-20 | Discharge: 2016-04-20 | Disposition: A | Discharge status: Home / Self Care | Payer: Self-pay | Source: Ambulatory Visit | Attending: Cardiovascular Disease | Admitting: Cardiovascular Disease

## 2016-04-20 DIAGNOSIS — R0609 Other forms of dyspnea: Secondary | ICD-10-CM

## 2016-04-23 ENCOUNTER — Telehealth: Payer: Self-pay | Admitting: Endocrinology

## 2016-04-23 ENCOUNTER — Encounter (HOSPITAL_COMMUNITY)
Admission: RE | Admit: 2016-04-23 | Discharge: 2016-04-23 | Disposition: A | Discharge status: Home / Self Care | Payer: Self-pay | Source: Ambulatory Visit | Attending: Cardiovascular Disease | Admitting: Cardiovascular Disease

## 2016-04-23 DIAGNOSIS — I214 Non-ST elevation (NSTEMI) myocardial infarction: Secondary | ICD-10-CM | POA: Insufficient documentation

## 2016-04-23 MED ORDER — HYDROCODONE-ACETAMINOPHEN 10-325 MG PO TABS: 1.0000 | ORAL_TABLET | Freq: Four times a day (QID) | ORAL | Status: DC | PRN

## 2016-04-23 NOTE — Telephone Encounter (Signed)
Pt needs refill on pain med please °

## 2016-04-23 NOTE — Telephone Encounter (Signed)
done

## 2016-04-24 NOTE — Telephone Encounter (Signed)
I contacted the pt and advised the Rx is ready for pick up. Rx placed upfront.

## 2016-04-25 ENCOUNTER — Encounter (HOSPITAL_COMMUNITY)
Admission: RE | Admit: 2016-04-25 | Discharge: 2016-04-25 | Disposition: A | Discharge status: Home / Self Care | Payer: Self-pay | Source: Ambulatory Visit | Attending: Cardiovascular Disease | Admitting: Cardiovascular Disease

## 2016-04-27 ENCOUNTER — Encounter (HOSPITAL_COMMUNITY)
Admission: RE | Admit: 2016-04-27 | Discharge: 2016-04-27 | Disposition: A | Discharge status: Home / Self Care | Payer: Self-pay | Source: Ambulatory Visit | Attending: Cardiovascular Disease | Admitting: Cardiovascular Disease

## 2016-04-30 ENCOUNTER — Encounter (HOSPITAL_COMMUNITY): Payer: Self-pay

## 2016-05-02 ENCOUNTER — Encounter (HOSPITAL_COMMUNITY)
Admission: RE | Admit: 2016-05-02 | Discharge: 2016-05-02 | Disposition: A | Discharge status: Home / Self Care | Payer: Self-pay | Source: Ambulatory Visit | Attending: Cardiovascular Disease | Admitting: Cardiovascular Disease

## 2016-05-04 ENCOUNTER — Encounter (HOSPITAL_COMMUNITY): Payer: Self-pay

## 2016-05-07 ENCOUNTER — Encounter (HOSPITAL_COMMUNITY): Payer: Self-pay

## 2016-05-09 ENCOUNTER — Encounter (HOSPITAL_COMMUNITY): Payer: Self-pay

## 2016-05-11 ENCOUNTER — Encounter (HOSPITAL_COMMUNITY): Payer: Self-pay

## 2016-05-12 DIAGNOSIS — J449 Chronic obstructive pulmonary disease, unspecified: Secondary | ICD-10-CM | POA: Diagnosis not present

## 2016-05-14 ENCOUNTER — Encounter (HOSPITAL_COMMUNITY)
Admission: RE | Admit: 2016-05-14 | Discharge: 2016-05-14 | Disposition: A | Discharge status: Home / Self Care | Payer: Self-pay | Source: Ambulatory Visit | Attending: Cardiovascular Disease | Admitting: Cardiovascular Disease

## 2016-05-16 ENCOUNTER — Encounter (HOSPITAL_COMMUNITY)
Admission: RE | Admit: 2016-05-16 | Discharge: 2016-05-16 | Disposition: A | Discharge status: Home / Self Care | Payer: Self-pay | Source: Ambulatory Visit | Attending: Cardiovascular Disease | Admitting: Cardiovascular Disease

## 2016-05-18 ENCOUNTER — Encounter (HOSPITAL_COMMUNITY)
Admission: RE | Admit: 2016-05-18 | Discharge: 2016-05-18 | Disposition: A | Discharge status: Home / Self Care | Payer: Self-pay | Source: Ambulatory Visit | Attending: Cardiovascular Disease | Admitting: Cardiovascular Disease

## 2016-05-23 ENCOUNTER — Other Ambulatory Visit: Payer: Self-pay | Admitting: Endocrinology

## 2016-05-23 ENCOUNTER — Telehealth: Payer: Self-pay | Admitting: Endocrinology

## 2016-05-23 ENCOUNTER — Encounter (HOSPITAL_COMMUNITY): Payer: Self-pay

## 2016-05-23 ENCOUNTER — Other Ambulatory Visit: Payer: Self-pay | Admitting: Cardiovascular Disease

## 2016-05-23 MED ORDER — HYDROCODONE-ACETAMINOPHEN 10-325 MG PO TABS: 1.0000 | ORAL_TABLET | Freq: Four times a day (QID) | ORAL | Status: DC | PRN

## 2016-05-23 NOTE — Telephone Encounter (Signed)
I contacted the pt and advised Rx is ready for pick up.

## 2016-05-23 NOTE — Telephone Encounter (Signed)
i printed 

## 2016-05-23 NOTE — Telephone Encounter (Signed)
See note below and please advise, Thanks! 

## 2016-05-23 NOTE — Telephone Encounter (Signed)
PT needs Hydrocodone refilled

## 2016-05-24 ENCOUNTER — Other Ambulatory Visit: Payer: Self-pay | Admitting: Endocrinology

## 2016-05-25 ENCOUNTER — Encounter (HOSPITAL_COMMUNITY)
Admission: RE | Admit: 2016-05-25 | Discharge: 2016-05-25 | Disposition: A | Discharge status: Home / Self Care | Payer: Self-pay | Source: Ambulatory Visit | Attending: Cardiovascular Disease | Admitting: Cardiovascular Disease

## 2016-05-25 DIAGNOSIS — I214 Non-ST elevation (NSTEMI) myocardial infarction: Secondary | ICD-10-CM | POA: Insufficient documentation

## 2016-05-28 ENCOUNTER — Encounter (HOSPITAL_COMMUNITY)
Admission: RE | Admit: 2016-05-28 | Discharge: 2016-05-28 | Disposition: A | Discharge status: Home / Self Care | Payer: Self-pay | Source: Ambulatory Visit | Attending: Cardiovascular Disease | Admitting: Cardiovascular Disease

## 2016-05-30 ENCOUNTER — Encounter (HOSPITAL_COMMUNITY): Payer: Self-pay

## 2016-06-01 ENCOUNTER — Encounter (HOSPITAL_COMMUNITY)
Admission: RE | Admit: 2016-06-01 | Discharge: 2016-06-01 | Disposition: A | Discharge status: Home / Self Care | Payer: Self-pay | Source: Ambulatory Visit | Attending: Cardiovascular Disease | Admitting: Cardiovascular Disease

## 2016-06-04 ENCOUNTER — Encounter (HOSPITAL_COMMUNITY)
Admission: RE | Admit: 2016-06-04 | Discharge: 2016-06-04 | Disposition: A | Discharge status: Home / Self Care | Payer: Self-pay | Source: Ambulatory Visit | Attending: Cardiovascular Disease | Admitting: Cardiovascular Disease

## 2016-06-06 ENCOUNTER — Encounter (HOSPITAL_COMMUNITY)
Admission: RE | Admit: 2016-06-06 | Discharge: 2016-06-06 | Disposition: A | Discharge status: Home / Self Care | Payer: Self-pay | Source: Ambulatory Visit | Attending: Cardiovascular Disease | Admitting: Cardiovascular Disease

## 2016-06-08 ENCOUNTER — Encounter (HOSPITAL_COMMUNITY): Payer: Self-pay

## 2016-06-11 ENCOUNTER — Encounter (HOSPITAL_COMMUNITY): Payer: Self-pay

## 2016-06-12 ENCOUNTER — Other Ambulatory Visit: Payer: Self-pay | Admitting: Endocrinology

## 2016-06-13 ENCOUNTER — Encounter: Payer: Self-pay | Admitting: Emergency Medicine

## 2016-06-13 ENCOUNTER — Encounter (HOSPITAL_COMMUNITY): Payer: Self-pay

## 2016-06-13 ENCOUNTER — Ambulatory Visit (INDEPENDENT_AMBULATORY_CARE_PROVIDER_SITE_OTHER): Payer: Medicare Other | Admitting: Emergency Medicine

## 2016-06-13 VITALS — BP 120/66 | HR 68 | Ht 63.0 in | Wt 207.0 lb

## 2016-06-13 DIAGNOSIS — R5383 Other fatigue: Secondary | ICD-10-CM | POA: Insufficient documentation

## 2016-06-13 DIAGNOSIS — J438 Other emphysema: Secondary | ICD-10-CM

## 2016-06-13 MED ORDER — ALBUTEROL SULFATE HFA 108 (90 BASE) MCG/ACT IN AERS
2.0000 | INHALATION_SPRAY | Freq: Four times a day (QID) | RESPIRATORY_TRACT | Status: DC | PRN
Start: 2016-06-13 — End: 2016-09-26
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
## 2016-06-13 NOTE — Patient Instructions (Signed)
Please continue your inhaled medications as you have been taking them  Continue zyrtec and astelin Follow with Dr Lamonte Sakai in 3 months or sooner if you have any problems.

## 2016-06-13 NOTE — Assessment & Plan Note (Signed)
Stable at this time.  Please continue your inhaled medications as you have been taking them  Continue zyrtec and astelin Follow with Dr Lamonte Sakai in 3 months or sooner if you have any problems.

## 2016-06-13 NOTE — Assessment & Plan Note (Signed)
Discussed Ddx and possible w/o with him. He will discuss with Dr Loanne Drilling

## 2016-06-13 NOTE — Addendum Note (Signed)
Addended by: Osa Craver on: 06/13/2016 02:01 PM   Modules accepted: Orders

## 2016-06-15 ENCOUNTER — Encounter (HOSPITAL_COMMUNITY): Payer: Self-pay

## 2016-06-18 ENCOUNTER — Encounter: Payer: Self-pay | Admitting: Emergency Medicine

## 2016-06-18 ENCOUNTER — Encounter (HOSPITAL_COMMUNITY): Payer: Self-pay

## 2016-06-20 ENCOUNTER — Encounter (HOSPITAL_COMMUNITY): Payer: Self-pay

## 2016-06-20 ENCOUNTER — Other Ambulatory Visit: Payer: Self-pay | Admitting: Endocrinology

## 2016-06-20 NOTE — Telephone Encounter (Signed)
Is this for neuropathy pain?  Will give him 30 tablets until Dr. Loanne Drilling is back next week

## 2016-06-20 NOTE — Telephone Encounter (Signed)
Pt is requesting a refill on hydrocodone. Last filled on 05/23/16 #100. Last OV 02/22/16. Are you willing to fill this medication?

## 2016-06-20 NOTE — Telephone Encounter (Signed)
PT needs Hydrocodone Rx refilled.

## 2016-06-21 MED ORDER — HYDROCODONE-ACETAMINOPHEN 10-325 MG PO TABS: 1.0000 | ORAL_TABLET | Freq: Four times a day (QID) | ORAL | Status: DC | PRN

## 2016-06-21 NOTE — Telephone Encounter (Signed)
I contacted the pt and advised rx is ready for pick up. Rx placed up front.  

## 2016-06-21 NOTE — Telephone Encounter (Signed)
[name]: Please spend prescription

## 2016-06-21 NOTE — Telephone Encounter (Signed)
Hey do you mind printing this rx so that the pt can pick it up there at the office? Thank you.

## 2016-06-22 ENCOUNTER — Encounter (HOSPITAL_COMMUNITY): Payer: Self-pay

## 2016-06-25 ENCOUNTER — Encounter (HOSPITAL_COMMUNITY)
Admission: RE | Admit: 2016-06-25 | Discharge: 2016-06-25 | Disposition: A | Discharge status: Home / Self Care | Payer: Self-pay | Source: Ambulatory Visit | Attending: Cardiovascular Disease | Admitting: Cardiovascular Disease

## 2016-06-25 DIAGNOSIS — I214 Non-ST elevation (NSTEMI) myocardial infarction: Secondary | ICD-10-CM | POA: Insufficient documentation

## 2016-06-27 ENCOUNTER — Encounter (HOSPITAL_COMMUNITY)
Admission: RE | Admit: 2016-06-27 | Discharge: 2016-06-27 | Disposition: A | Discharge status: Home / Self Care | Payer: Self-pay | Source: Ambulatory Visit | Attending: Cardiovascular Disease | Admitting: Cardiovascular Disease

## 2016-06-29 ENCOUNTER — Encounter (HOSPITAL_COMMUNITY)
Admission: RE | Admit: 2016-06-29 | Discharge: 2016-06-29 | Disposition: A | Discharge status: Home / Self Care | Payer: Self-pay | Source: Ambulatory Visit | Attending: Cardiovascular Disease | Admitting: Cardiovascular Disease

## 2016-07-01 ENCOUNTER — Other Ambulatory Visit: Payer: Self-pay | Admitting: Cardiovascular Disease

## 2016-07-02 ENCOUNTER — Encounter (HOSPITAL_COMMUNITY)
Admission: RE | Admit: 2016-07-02 | Discharge: 2016-07-02 | Disposition: A | Discharge status: Home / Self Care | Payer: Self-pay | Source: Ambulatory Visit | Attending: Cardiovascular Disease | Admitting: Cardiovascular Disease

## 2016-07-03 ENCOUNTER — Encounter: Payer: Self-pay | Admitting: Endocrinology

## 2016-07-03 ENCOUNTER — Ambulatory Visit (INDEPENDENT_AMBULATORY_CARE_PROVIDER_SITE_OTHER): Payer: Medicare Other | Admitting: Endocrinology

## 2016-07-03 VITALS — BP 122/60 | HR 72 | Wt 207.2 lb

## 2016-07-03 DIAGNOSIS — N181 Chronic kidney disease, stage 1: Secondary | ICD-10-CM | POA: Diagnosis not present

## 2016-07-03 DIAGNOSIS — E1022 Type 1 diabetes mellitus with diabetic chronic kidney disease: Secondary | ICD-10-CM

## 2016-07-03 LAB — POCT GLYCOSYLATED HEMOGLOBIN (HGB A1C): Hemoglobin A1C: 7.6

## 2016-07-03 MED ORDER — HYDROCODONE-ACETAMINOPHEN 10-325 MG PO TABS: 1.0000 | ORAL_TABLET | Freq: Four times a day (QID) | ORAL | Status: DC | PRN
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
## 2016-07-03 NOTE — Patient Instructions (Signed)
please change the novolog to 4 times a day (just before each meal) 20-5-15-8 units.  please consider these measures for your health:  minimize alcohol.  do not use tobacco products.  have a colonoscopy at least every 10 years from age 74.  keep firearms safely stored.  always use seat belts.  have working smoke alarms in your home.  see an eye doctor and dentist regularly.  never drive under the influence of alcohol or drugs (including prescription drugs).  those with fair skin should take precautions against the sun. it is critically important to prevent falling down (keep floor areas well-lit, dry, and free of loose objects.  If you have a cane, walker, or wheelchair, you should use it, even for short trips around the house.  Also, try not to rush) Please come back for a follow-up appointment in 4 months.

## 2016-07-04 ENCOUNTER — Encounter (HOSPITAL_COMMUNITY)
Admission: RE | Admit: 2016-07-04 | Discharge: 2016-07-04 | Disposition: A | Discharge status: Home / Self Care | Payer: Self-pay | Source: Ambulatory Visit | Attending: Cardiovascular Disease | Admitting: Cardiovascular Disease

## 2016-07-05 ENCOUNTER — Other Ambulatory Visit: Payer: Self-pay | Admitting: Cardiovascular Disease

## 2016-07-05 DIAGNOSIS — Z4651 Encounter for fitting and adjustment of gastric lap band: Secondary | ICD-10-CM | POA: Diagnosis not present

## 2016-07-06 ENCOUNTER — Encounter (HOSPITAL_COMMUNITY): Admission: RE | Admit: 2016-07-06 | Payer: Self-pay | Source: Ambulatory Visit

## 2016-07-06 ENCOUNTER — Other Ambulatory Visit: Payer: Self-pay | Admitting: Cardiovascular Disease

## 2016-07-09 ENCOUNTER — Encounter (HOSPITAL_COMMUNITY)
Admission: RE | Admit: 2016-07-09 | Discharge: 2016-07-09 | Disposition: A | Discharge status: Home / Self Care | Payer: Self-pay | Source: Ambulatory Visit | Attending: Cardiovascular Disease | Admitting: Cardiovascular Disease

## 2016-07-09 ENCOUNTER — Other Ambulatory Visit: Payer: Self-pay | Admitting: Endocrinology

## 2016-07-09 DIAGNOSIS — E119 Type 2 diabetes mellitus without complications: Secondary | ICD-10-CM | POA: Diagnosis not present

## 2016-07-09 LAB — HM DIABETES EYE EXAM

## 2016-07-11 ENCOUNTER — Encounter (HOSPITAL_COMMUNITY)
Admission: RE | Admit: 2016-07-11 | Discharge: 2016-07-11 | Disposition: A | Discharge status: Home / Self Care | Payer: Self-pay | Source: Ambulatory Visit | Attending: Cardiovascular Disease | Admitting: Cardiovascular Disease

## 2016-07-12 ENCOUNTER — Encounter: Payer: Self-pay | Admitting: *Deleted

## 2016-07-13 ENCOUNTER — Encounter (HOSPITAL_COMMUNITY)
Admission: RE | Admit: 2016-07-13 | Discharge: 2016-07-13 | Disposition: A | Discharge status: Home / Self Care | Payer: Self-pay | Source: Ambulatory Visit | Attending: Cardiovascular Disease | Admitting: Cardiovascular Disease

## 2016-07-16 ENCOUNTER — Encounter (HOSPITAL_COMMUNITY)
Admission: RE | Admit: 2016-07-16 | Discharge: 2016-07-16 | Disposition: A | Discharge status: Home / Self Care | Payer: Self-pay | Source: Ambulatory Visit | Attending: Cardiovascular Disease | Admitting: Cardiovascular Disease

## 2016-07-18 ENCOUNTER — Encounter (HOSPITAL_COMMUNITY)
Admission: RE | Admit: 2016-07-18 | Discharge: 2016-07-18 | Disposition: A | Discharge status: Home / Self Care | Payer: Self-pay | Source: Ambulatory Visit | Attending: Cardiovascular Disease | Admitting: Cardiovascular Disease

## 2016-07-18 DIAGNOSIS — N183 Chronic kidney disease, stage 3 (moderate): Secondary | ICD-10-CM | POA: Diagnosis not present

## 2016-07-18 DIAGNOSIS — E669 Obesity, unspecified: Secondary | ICD-10-CM | POA: Diagnosis not present

## 2016-07-18 DIAGNOSIS — I10 Essential (primary) hypertension: Secondary | ICD-10-CM | POA: Diagnosis not present

## 2016-07-18 DIAGNOSIS — I2510 Atherosclerotic heart disease of native coronary artery without angina pectoris: Secondary | ICD-10-CM | POA: Diagnosis not present

## 2016-07-20 ENCOUNTER — Encounter (HOSPITAL_COMMUNITY)
Admission: RE | Admit: 2016-07-20 | Discharge: 2016-07-20 | Disposition: A | Discharge status: Home / Self Care | Payer: Self-pay | Source: Ambulatory Visit | Attending: Cardiovascular Disease | Admitting: Cardiovascular Disease

## 2016-07-23 ENCOUNTER — Encounter (HOSPITAL_COMMUNITY): Payer: Self-pay

## 2016-07-25 ENCOUNTER — Encounter (HOSPITAL_COMMUNITY)
Admission: RE | Admit: 2016-07-25 | Discharge: 2016-07-25 | Disposition: A | Discharge status: Home / Self Care | Payer: Self-pay | Source: Ambulatory Visit | Attending: Cardiovascular Disease | Admitting: Cardiovascular Disease

## 2016-07-25 DIAGNOSIS — I214 Non-ST elevation (NSTEMI) myocardial infarction: Secondary | ICD-10-CM | POA: Insufficient documentation

## 2016-07-27 ENCOUNTER — Encounter (HOSPITAL_COMMUNITY)
Admission: RE | Admit: 2016-07-27 | Discharge: 2016-07-27 | Disposition: A | Discharge status: Home / Self Care | Payer: Self-pay | Source: Ambulatory Visit | Attending: Cardiovascular Disease | Admitting: Cardiovascular Disease

## 2016-07-30 ENCOUNTER — Encounter (HOSPITAL_COMMUNITY)
Admission: RE | Admit: 2016-07-30 | Discharge: 2016-07-30 | Disposition: A | Discharge status: Home / Self Care | Payer: Self-pay | Source: Ambulatory Visit | Attending: Cardiovascular Disease | Admitting: Cardiovascular Disease

## 2016-08-01 ENCOUNTER — Telehealth: Payer: Self-pay | Admitting: Endocrinology

## 2016-08-01 ENCOUNTER — Other Ambulatory Visit: Payer: Self-pay

## 2016-08-01 ENCOUNTER — Encounter (HOSPITAL_COMMUNITY)
Admission: RE | Admit: 2016-08-01 | Discharge: 2016-08-01 | Disposition: A | Discharge status: Home / Self Care | Payer: Self-pay | Source: Ambulatory Visit | Attending: Cardiovascular Disease | Admitting: Cardiovascular Disease

## 2016-08-01 MED ORDER — HYDROCODONE-ACETAMINOPHEN 10-325 MG PO TABS: 1.0000 | ORAL_TABLET | Freq: Four times a day (QID) | ORAL | 0 refills | Status: DC | PRN

## 2016-08-01 NOTE — Telephone Encounter (Signed)
OK 

## 2016-08-01 NOTE — Telephone Encounter (Signed)
PT called, he needs his Hydrocodone refilled.

## 2016-08-01 NOTE — Telephone Encounter (Signed)
Rx printed and placed on Md's desk to sign once she returns in the am on (08/02/2016).

## 2016-08-02 NOTE — Telephone Encounter (Signed)
Dr. Ena Dawley has signed Rx during Dr. Cordelia Pen absence. Pt notified rx is ready for pick up. Rx placed up front.

## 2016-08-03 ENCOUNTER — Encounter (HOSPITAL_COMMUNITY)
Admission: RE | Admit: 2016-08-03 | Discharge: 2016-08-03 | Disposition: A | Discharge status: Home / Self Care | Payer: Self-pay | Source: Ambulatory Visit | Attending: Cardiovascular Disease | Admitting: Cardiovascular Disease

## 2016-08-06 ENCOUNTER — Encounter (HOSPITAL_COMMUNITY): Payer: Self-pay

## 2016-08-08 ENCOUNTER — Encounter (HOSPITAL_COMMUNITY): Payer: Self-pay

## 2016-08-10 ENCOUNTER — Encounter (HOSPITAL_COMMUNITY): Payer: Self-pay

## 2016-08-12 ENCOUNTER — Other Ambulatory Visit: Payer: Self-pay | Admitting: Cardiovascular Disease

## 2016-08-13 ENCOUNTER — Encounter (HOSPITAL_COMMUNITY): Payer: Self-pay

## 2016-08-15 ENCOUNTER — Encounter (HOSPITAL_COMMUNITY)
Admission: RE | Admit: 2016-08-15 | Discharge: 2016-08-15 | Disposition: A | Discharge status: Home / Self Care | Payer: Self-pay | Source: Ambulatory Visit | Attending: Cardiovascular Disease | Admitting: Cardiovascular Disease

## 2016-08-17 ENCOUNTER — Encounter (HOSPITAL_COMMUNITY)
Admission: RE | Admit: 2016-08-17 | Discharge: 2016-08-17 | Disposition: A | Discharge status: Home / Self Care | Payer: Self-pay | Source: Ambulatory Visit | Attending: Cardiovascular Disease | Admitting: Cardiovascular Disease

## 2016-08-18 ENCOUNTER — Other Ambulatory Visit: Payer: Self-pay | Admitting: Endocrinology

## 2016-08-18 ENCOUNTER — Telehealth: Payer: Self-pay | Admitting: Endocrinology

## 2016-08-18 ENCOUNTER — Other Ambulatory Visit: Payer: Self-pay | Admitting: Cardiovascular Disease

## 2016-08-18 MED ORDER — LANSOPRAZOLE 15 MG PO CPDR: 15.0000 mg | DELAYED_RELEASE_CAPSULE | Freq: Every day | ORAL | 3 refills | Status: DC

## 2016-08-18 MED ORDER — FINASTERIDE 5 MG PO TABS: 5.0000 mg | ORAL_TABLET | Freq: Every day | ORAL | 0 refills | Status: DC

## 2016-08-18 MED ORDER — TRAZODONE HCL 100 MG PO TABS: ORAL_TABLET | ORAL | 3 refills | Status: DC

## 2016-08-18 NOTE — Telephone Encounter (Signed)
please call patient: Due to several drug interactions, we have to change omeprazole to pantoprazole.  I have sent a prescription to your pharmacy.

## 2016-08-20 ENCOUNTER — Encounter (HOSPITAL_COMMUNITY)
Admission: RE | Admit: 2016-08-20 | Discharge: 2016-08-20 | Disposition: A | Discharge status: Home / Self Care | Payer: Self-pay | Source: Ambulatory Visit | Attending: Cardiovascular Disease | Admitting: Cardiovascular Disease

## 2016-08-20 ENCOUNTER — Other Ambulatory Visit: Payer: Self-pay | Admitting: Endocrinology

## 2016-08-20 MED ORDER — HYDROCODONE-ACETAMINOPHEN 10-325 MG PO TABS: 1.0000 | ORAL_TABLET | Freq: Four times a day (QID) | ORAL | 0 refills | Status: DC | PRN

## 2016-08-20 NOTE — Telephone Encounter (Signed)
I printed  

## 2016-08-20 NOTE — Telephone Encounter (Signed)
Patient need refill of HYDROcodone-acetaminophen (NORCO) 10-325 MG tablet

## 2016-08-20 NOTE — Telephone Encounter (Signed)
See message.

## 2016-08-21 MED ORDER — FA-PYRIDOXINE-CYANOCOBALAMIN 2.5-25-2 MG PO TABS: 1.0000 | ORAL_TABLET | Freq: Every day | ORAL | 3 refills | Status: DC

## 2016-08-21 NOTE — Telephone Encounter (Signed)
I refilled virt-vite. The other med change was due to an interaction.

## 2016-08-21 NOTE — Telephone Encounter (Signed)
I contacted the patient and advised of MD's response. Patient voiced understanding.

## 2016-08-21 NOTE — Telephone Encounter (Signed)
I contacted the patient and advised his hydrocodone prescription is ready for pick up. Patient voiced understanding on this but had two questions regarding his current medication regimen. Patient wanted to clarify why his Prilosec had been changed to lansoprazole and if we could send a refill for vibra-tabs? Could you review and please advise on how to proceed.

## 2016-08-22 ENCOUNTER — Encounter (HOSPITAL_COMMUNITY)
Admission: RE | Admit: 2016-08-22 | Discharge: 2016-08-22 | Disposition: A | Discharge status: Home / Self Care | Payer: Self-pay | Source: Ambulatory Visit | Attending: Cardiovascular Disease | Admitting: Cardiovascular Disease

## 2016-08-24 ENCOUNTER — Ambulatory Visit (INDEPENDENT_AMBULATORY_CARE_PROVIDER_SITE_OTHER): Payer: Medicare Other | Admitting: Endocrinology

## 2016-08-24 ENCOUNTER — Encounter (HOSPITAL_COMMUNITY)
Admission: RE | Admit: 2016-08-24 | Discharge: 2016-08-24 | Disposition: A | Discharge status: Home / Self Care | Payer: Self-pay | Source: Ambulatory Visit | Attending: Cardiovascular Disease | Admitting: Cardiovascular Disease

## 2016-08-24 VITALS — BP 130/60 | HR 64 | Wt 197.0 lb

## 2016-08-24 DIAGNOSIS — E1151 Type 2 diabetes mellitus with diabetic peripheral angiopathy without gangrene: Secondary | ICD-10-CM | POA: Diagnosis not present

## 2016-08-24 DIAGNOSIS — I214 Non-ST elevation (NSTEMI) myocardial infarction: Secondary | ICD-10-CM | POA: Insufficient documentation

## 2016-08-24 DIAGNOSIS — Z794 Long term (current) use of insulin: Secondary | ICD-10-CM
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
## 2016-08-24 NOTE — Patient Instructions (Addendum)
Please continue the same insulin.  check your blood sugar twice a day.  vary the time of day when you check, between before the 3 meals, and at bedtime.  also check if you have symptoms of your blood sugar being too high or too low.  please keep a record of the readings and bring it to your next appointment here (or you can bring the meter itself).  You can write it on any piece of paper.  please call us sooner if your blood sugar goes below 70, or if you have a lot of readings over 200.  Please come back for a follow-up appointment in January.

## 2016-08-28 LAB — FRUCTOSAMINE: FRUCTOSAMINE: 268 umol/L (ref 190–270)

## 2016-08-29 ENCOUNTER — Encounter (HOSPITAL_COMMUNITY)
Admission: RE | Admit: 2016-08-29 | Discharge: 2016-08-29 | Disposition: A | Discharge status: Home / Self Care | Payer: Self-pay | Source: Ambulatory Visit | Attending: Cardiovascular Disease | Admitting: Cardiovascular Disease

## 2016-08-31 ENCOUNTER — Encounter (HOSPITAL_COMMUNITY): Payer: Self-pay

## 2016-09-03 ENCOUNTER — Encounter (HOSPITAL_COMMUNITY): Payer: Self-pay

## 2016-09-03 ENCOUNTER — Ambulatory Visit (INDEPENDENT_AMBULATORY_CARE_PROVIDER_SITE_OTHER)
Admission: RE | Admit: 2016-09-03 | Discharge: 2016-09-03 | Disposition: A | Discharge status: Home / Self Care | Payer: Medicare Other | Source: Ambulatory Visit | Attending: Internal Medicine | Admitting: Internal Medicine

## 2016-09-03 ENCOUNTER — Encounter: Payer: Self-pay | Admitting: Internal Medicine

## 2016-09-03 ENCOUNTER — Ambulatory Visit (INDEPENDENT_AMBULATORY_CARE_PROVIDER_SITE_OTHER): Payer: Medicare Other | Admitting: Internal Medicine

## 2016-09-03 ENCOUNTER — Other Ambulatory Visit: Payer: Medicare Other

## 2016-09-03 VITALS — BP 128/70 | HR 75 | Temp 98.5°F | Ht 63.0 in | Wt 198.0 lb

## 2016-09-03 DIAGNOSIS — J441 Chronic obstructive pulmonary disease with (acute) exacerbation: Secondary | ICD-10-CM | POA: Diagnosis not present

## 2016-09-03 DIAGNOSIS — J9611 Chronic respiratory failure with hypoxia: Secondary | ICD-10-CM

## 2016-09-03 DIAGNOSIS — J9612 Chronic respiratory failure with hypercapnia: Secondary | ICD-10-CM | POA: Diagnosis not present

## 2016-09-03 DIAGNOSIS — R05 Cough: Secondary | ICD-10-CM | POA: Diagnosis not present

## 2016-09-03 MED ORDER — TIOTROPIUM BROMIDE MONOHYDRATE 1.25 MCG/ACT IN AERS: 2.0000 | INHALATION_SPRAY | Freq: Every day | RESPIRATORY_TRACT | 0 refills | Status: DC

## 2016-09-03 MED ORDER — TIOTROPIUM BROMIDE MONOHYDRATE 2.5 MCG/ACT IN AERS: 2.0000 | INHALATION_SPRAY | Freq: Every day | RESPIRATORY_TRACT | 11 refills | Status: DC

## 2016-09-03 MED ORDER — PREDNISONE 10 MG PO TABS: ORAL_TABLET | ORAL | 0 refills | Status: DC

## 2016-09-03 MED ORDER — AZITHROMYCIN 250 MG PO TABS: ORAL_TABLET | ORAL | 0 refills | Status: DC

## 2016-09-03 MED ORDER — FLUTTER DEVI: 0 refills | Status: DC

## 2016-09-03 NOTE — Patient Instructions (Addendum)
Plan A = Automatic = symbicort 160 x2  And spiriva x 2 puffs first thing in am/ symbicort 160 x 2 12 hours later   Plan B = Backup Only use your albuterol(ventolin)  as a rescue medication to be used if you can't catch your breath by resting or doing a relaxed purse lip breathing pattern.  - The less you use it, the better it will work when you need it. - Ok to use the inhaler up to 2 puffs  every 4 hours if you must but call for appointment if use goes up over your usual need - Don't leave home without it !!  (think of it like the spare tire for your car)   Plan C = Crisis - only use your albuterol nebulizer if you first try Plan B and it fails to help > ok to use the nebulizer up to every 4 hours but if start needing it regularly call for immediate appointment   Please remember to go to the  x-ray department downstairs for your tests - we will call you with the results when they are available.     zpak on standby / Prednisone 10 mg take  4 each am x 2 days,   2 each am x 2 days,  1 each am x 2 days and stop   For cough > mucinex  Or mucinex dm up to 1200 mg every 12 hours as needed and double the prevacid until better and use the flutter valve as much as possible  Please remember to go to the  x-ray department downstairs for your tests - we will call you with the results when they are available.  Keep follow up with Dr Lamonte Sakai as planned - call sooner if needed

## 2016-09-03 NOTE — Progress Notes (Signed)
Subjective:    Patient ID: [name] Alcon., male    DOB: March 16, 1941, 75 y.o.   MRN: [id number]  Brief patient profile:  75 year old former smoker (80 pack years), with a history of coronary disease, diastolic CHF, diabetes. He has undergone lap band surgery. He has been followed by Dr [name] for COPD.   History of Present Illness  [name] ov 11/25/2015  He was last seen in May 2016 after being treated for a pneumonia.he is currently managed on Spiriva and Symbicort, albuterol when necessary. He is on Zyrtec, Astelin prn, for chronic rhinitis. He continues to participate in cardiopulm rehab. He has been slowly more dyspneic over the last 2 years. He feels that he hasn't gotten back to baseline following 3 recent exacerbations. Rare albuterol use. He had some drainage, wheeze this am. Flu shot UTD.  rec Please continue your Spiriva and Symbicort as you are taking it.  Continue to keep albuterol available to use as needed.  Continue zyrtec Use astelin as needed for nasal congestion.  Continue your Cardiopulmonary Rehab.    ROV 06/13/16 [name] ov follow-up visit for patient with COPD, diastolic CHF, history of lap-band surgery. Also with chronic rhinitis - benefits from astelin and zyrtec prn. He is having some increase in dyspnea as the weather has gotten warmer. Some chest tightness. He coughs occasionally, not always productive. He is using SABA, rare ventolin.  On symbicort bid. Good sleep, wear his o2 at 2L/min at night. Lack of energy. Has gained about 9 lbs since last time.  rec Please continue your inhaled medications as you have been taking them  Continue zyrtec and astelin    09/03/2016 acute extended ov/[name] re: cough and sob  Chief Complaint  Patient presents with  . Acute Visit    Pt c/o increased cough and SOB for the past 5 days. Cough is prod with grey sputum.    02 at hs 2lpm but not using daytime Onset was abrupt / confused with maint rx vs action plan re how / when to use  saba  Comfortable at rest p neb but otherwise sob with room to room on RA Cough is min productive, mostly in am's grey but not green or yellow or bloody   No obvious day to day or daytime variability or assoc  cp or chest tightness, subjective wheeze or overt sinus or hb symptoms. No unusual exp hx or h/o childhood pna/ asthma or knowledge of premature birth.  Sleeping ok without nocturnal  or early am exacerbation  of respiratory  c/o's or need for noct saba. Also denies any obvious fluctuation of symptoms with weather or environmental changes or other aggravating or alleviating factors except as outlined above   Current Medications, Allergies, Complete Past Medical History, Past Surgical History, Family History, and Social History were reviewed in [employer] record.  ROS  The following are not active complaints unless bolded sore throat, dysphagia, dental problems, itching, sneezing,  nasal congestion or excess/ purulent secretions, ear ache,   fever, chills, sweats, unintended wt loss, classically pleuritic or exertional cp, hemoptysis,  orthopnea pnd or leg swelling, presyncope, palpitations, abdominal pain, anorexia, nausea, vomiting, diarrhea  or change in bowel or bladder habits, change in stools or urine, dysuria,hematuria,  rash, arthralgias, visual complaints, headache, numbness, weakness or ataxia or problems with walking or coordination,  change in mood/affect or memory.                   Objective:  Physical Exam    amb wm nad   Wt Readings from Last 3 Encounters:  09/03/16 198 lb (89.8 kg)  08/24/16 197 lb (89.4 kg)  07/03/16 207 lb 3.2 oz (94 kg)    Vital signs reviewed - note sats 95% RA on arrival     Gen: Pleasant, overwt, in no distress,  normal affect  ENT: No lesions,  mouth clear,  oropharynx clear, no postnasal drip  Neck: No JVD, no stridor  Lungs: No use of accessory muscles, insp and exp rhonchi with decreased bs and dullness  R base  Cardiovascular: RRR, heart sounds normal, no murmur or gallops, trace peripheral edema  Musculoskeletal: No deformities, no cyanosis or clubbing  Neuro: alert, non focal  Skin: Warm, no lesions or rashes   CXR PA and Lateral:   09/03/2016 :    I personally reviewed images and agree with radiology impression as follows:    Persistent small right pleural effusion and patchy right basilar opacities which may reflect residual/recurrent pneumonia or asymmetric edema. Continued radiographic follow-up is warranted following treatment, with chest CT recommended to assess for an underlying neoplasm if findings do not resolve.      Assessment & Plan:

## 2016-09-04 DIAGNOSIS — J9611 Chronic respiratory failure with hypoxia: Secondary | ICD-10-CM | POA: Insufficient documentation

## 2016-09-04 DIAGNOSIS — J9612 Chronic respiratory failure with hypercapnia: Secondary | ICD-10-CM

## 2016-09-04 NOTE — Assessment & Plan Note (Signed)
PFT's 2006:  FEV1 1.61 (64%), ratio 62%, +airtrapping, DLCO 62% PFT's 2013:  FEV1 0.91 (39%), ratio 66, +airtrapping, TLC 64%, DLCO 52% Ambulatory ox 01/2012:  Low sat 92% with walking. +pulmonary rehab.   [place] 06/2013:  PCWP 22 ONO 03/2014:  Low sat 84%, but only 36 sec entire night less than 88%.  - 09/03/2016  After extensive coaching HFA effectiveness =    75% (Ti too short)    DDX of  difficult airways management almost all start with A and  include Adherence, Ace Inhibitors, Acid Reflux, Active Sinus Disease, Alpha 1 Antitripsin deficiency, Anxiety masquerading as Airways dz,  ABPA,  Allergy(esp in young), Aspiration (esp in elderly), Adverse effects of meds,  Active smokers, A bunch of PE's (a small clot burden can't cause this syndrome unless there is already severe underlying pulm or vascular dz with poor reserve) plus two Bs  = Bronchiectasis and Beta blocker use..and one C= CHF  Adherence is always the initial "prime suspect" and is a multilayered concern that requires a "trust but verify" approach in every patient - starting with knowing how to use medications, especially inhalers, correctly, keeping up with refills and understanding the fundamental difference between maintenance and prns vs those medications only taken for a very short course and then stopped and not refilled.  - The proper method of use, as well as anticipated side effects, of a metered-dose inhaler are discussed and demonstrated to the patient. Improved effectiveness after extensive coaching during this visit to a level of approximately 75 % from a baseline of 50 % > continue hfa symbicort but try spiriva respimat rather than dpi to improve adherence by asking him to do one form of device     ? Acid (or non-acid) GERD > always difficult to exclude as up to 75% of pts in some series report no assoc GI/ Heartburn symptoms> rec continue max (24h)  acid suppression and diet restrictions/ reviewed     ? Allergy/asthma component  > Prednisone 10 mg take  4 each am x 2 days,   2 each am x 2 days,  1 each am x 2 days and stop  ? Active sinusitis/ bronchitis > doubt but  zpak should cover prn purulent sputum  ? chf > strongly suspect with chronic R pl effusion > consider echo next if not improving    I had an extended discussion with the patient reviewing all relevant studies completed to date and  lasting 25 minutes of a 40  minute acute  visit  For acute on chronic refractory symptoms   Each maintenance medication was reviewed in detail including most importantly the difference between maintenance and prns and under what circumstances the prns are to be triggered using an action plan format that is not reflected in the computer generated alphabetically organized AVS.    Please see instructions for details which were reviewed in writing and the patient given a copy highlighting the part that I personally wrote and discussed at today's ov.

## 2016-09-04 NOTE — Assessment & Plan Note (Signed)
HCO3  02/10/2015 = 32   Despite flare still ok on RA > has noct 02 2lpm can use prn daytime if condition worsens but no need for change rx for now

## 2016-09-04 NOTE — Progress Notes (Signed)
Spoke with pt and notified of results per Dr. Wert. Pt verbalized understanding and denied any questions. 

## 2016-09-05 ENCOUNTER — Encounter (HOSPITAL_COMMUNITY): Payer: Self-pay

## 2016-09-07 ENCOUNTER — Encounter (HOSPITAL_COMMUNITY): Payer: Self-pay

## 2016-09-07 ENCOUNTER — Telehealth: Payer: Self-pay | Admitting: Emergency Medicine

## 2016-09-07 MED ORDER — LEVOFLOXACIN 500 MG PO TABS: 500.0000 mg | ORAL_TABLET | Freq: Every day | ORAL | 0 refills | Status: DC

## 2016-09-07 NOTE — Telephone Encounter (Signed)
levaquin 500 mg daily x 7 days 

## 2016-09-07 NOTE — Telephone Encounter (Signed)
Spoke with pt and gave recommendation. Pt agreed. Rx sent. Pt advised to call if not improved. Nothing further needed.

## 2016-09-07 NOTE — Telephone Encounter (Signed)
Per 09/03/16 OV: Plan A = Automatic = symbicort 160 x2  And spiriva x 2 puffs first thing in am/ symbicort 160 x 2 12 hours later Plan B = Backup Only use your albuterol(ventolin)  as a rescue medication to be used if you can't catch your breath by resting or doing a relaxed purse lip breathing pattern.  - The less you use it, the better it will work when you need it. - Ok to use the inhaler up to 2 puffs  every 4 hours if you must but call for appointment if use goes up over your usual need - Don't leave home without it !!  (think of it like the spare tire for your car)  Plan C = Crisis - only use your albuterol nebulizer if you first try Plan B and it fails to help > ok to use the nebulizer up to every 4 hours but if start needing it regularly call for immediate appointment Please remember to go to the  x-ray department downstairs for your tests - we will call you with the results when they are available. zpak on standby / Prednisone 10 mg take  4 each am x 2 days,   2 each am x 2 days,  1 each am x 2 days and stop  For cough > mucinex  Or mucinex dm up to 1200 mg every 12 hours as needed and double the prevacid until better and use the flutter valve as much as possible Please remember to go to the  x-ray department downstairs for your tests - we will call you with the results when they are available. Keep follow up with Dr Lamonte Sakai as planned - call sooner if needed    ------  Spoke with pt. He has finished the ZPAK and has 2 days left of prednisone.  Reports he is coughing up (gray phlem), chills, sweats. Has not checked temp to see if he has a fever. His recent CXR did report PNA. Pt thinks he needs another round of ABX.  Please advise MW thanks

## 2016-09-09 ENCOUNTER — Other Ambulatory Visit: Payer: Self-pay | Admitting: Endocrinology

## 2016-09-09 ENCOUNTER — Other Ambulatory Visit: Payer: Self-pay | Admitting: Cardiovascular Disease

## 2016-09-10 ENCOUNTER — Encounter (HOSPITAL_COMMUNITY): Payer: Self-pay

## 2016-09-11 ENCOUNTER — Other Ambulatory Visit: Payer: Self-pay | Admitting: Cardiovascular Disease

## 2016-09-12 ENCOUNTER — Encounter (HOSPITAL_COMMUNITY): Payer: Self-pay

## 2016-09-14 ENCOUNTER — Encounter (HOSPITAL_COMMUNITY): Payer: Self-pay

## 2016-09-17 ENCOUNTER — Encounter (HOSPITAL_COMMUNITY): Payer: Self-pay

## 2016-09-17 DIAGNOSIS — L820 Inflamed seborrheic keratosis: Secondary | ICD-10-CM | POA: Diagnosis not present

## 2016-09-17 DIAGNOSIS — Z86018 Personal history of other benign neoplasm: Secondary | ICD-10-CM | POA: Diagnosis not present

## 2016-09-17 DIAGNOSIS — L821 Other seborrheic keratosis: Secondary | ICD-10-CM | POA: Diagnosis not present

## 2016-09-17 DIAGNOSIS — L219 Seborrheic dermatitis, unspecified: Secondary | ICD-10-CM | POA: Diagnosis not present

## 2016-09-17 DIAGNOSIS — L570 Actinic keratosis: Secondary | ICD-10-CM | POA: Diagnosis not present

## 2016-09-17 DIAGNOSIS — L918 Other hypertrophic disorders of the skin: Secondary | ICD-10-CM | POA: Diagnosis not present

## 2016-09-17 DIAGNOSIS — D1801 Hemangioma of skin and subcutaneous tissue: Secondary | ICD-10-CM | POA: Diagnosis not present

## 2016-09-17 DIAGNOSIS — L814 Other melanin hyperpigmentation: Secondary | ICD-10-CM | POA: Diagnosis not present

## 2016-09-17 DIAGNOSIS — Z23 Encounter for immunization: Secondary | ICD-10-CM | POA: Diagnosis not present

## 2016-09-17 DIAGNOSIS — Z85828 Personal history of other malignant neoplasm of skin: Secondary | ICD-10-CM | POA: Diagnosis not present

## 2016-09-17 DIAGNOSIS — D225 Melanocytic nevi of trunk: Secondary | ICD-10-CM | POA: Diagnosis not present

## 2016-09-19 ENCOUNTER — Ambulatory Visit (INDEPENDENT_AMBULATORY_CARE_PROVIDER_SITE_OTHER): Payer: Medicare Other | Admitting: Emergency Medicine

## 2016-09-19 ENCOUNTER — Encounter (HOSPITAL_COMMUNITY): Payer: Self-pay

## 2016-09-19 ENCOUNTER — Encounter: Payer: Self-pay | Admitting: Emergency Medicine

## 2016-09-19 DIAGNOSIS — J309 Allergic rhinitis, unspecified: Secondary | ICD-10-CM | POA: Diagnosis not present

## 2016-09-19 DIAGNOSIS — Z23 Encounter for immunization: Secondary | ICD-10-CM | POA: Diagnosis not present

## 2016-09-19 DIAGNOSIS — J438 Other emphysema: Secondary | ICD-10-CM | POA: Diagnosis not present

## 2016-09-19 NOTE — Patient Instructions (Signed)
Please continue your medications as you have been taking them We will give the flu shot today.  It is ok for you to go back to cardiopulmonary rehab Follow with Dr Lamonte Sakai in 4 months or sooner if you have any problems.

## 2016-09-19 NOTE — Assessment & Plan Note (Signed)
Continue same meds as ordered.
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
## 2016-09-19 NOTE — Assessment & Plan Note (Signed)
Please continue your medications as you have been taking them We will give the flu shot today.  It is ok for you to go back to cardiopulmonary rehab Follow with Dr Lamonte Sakai in 4 months or sooner if you have any problems.

## 2016-09-21 ENCOUNTER — Encounter (HOSPITAL_COMMUNITY)
Admission: RE | Admit: 2016-09-21 | Discharge: 2016-09-21 | Disposition: A | Discharge status: Home / Self Care | Payer: Self-pay | Source: Ambulatory Visit | Attending: Cardiovascular Disease | Admitting: Cardiovascular Disease

## 2016-09-24 ENCOUNTER — Encounter (HOSPITAL_COMMUNITY): Payer: Self-pay

## 2016-09-24 DIAGNOSIS — I214 Non-ST elevation (NSTEMI) myocardial infarction: Secondary | ICD-10-CM | POA: Insufficient documentation

## 2016-09-26 ENCOUNTER — Encounter (HOSPITAL_COMMUNITY)
Admission: RE | Admit: 2016-09-26 | Discharge: 2016-09-26 | Disposition: A | Discharge status: Home / Self Care | Payer: Self-pay | Source: Ambulatory Visit | Attending: Cardiovascular Disease | Admitting: Cardiovascular Disease

## 2016-09-26 ENCOUNTER — Encounter: Payer: Self-pay | Admitting: Cardiovascular Disease

## 2016-09-26 ENCOUNTER — Ambulatory Visit (INDEPENDENT_AMBULATORY_CARE_PROVIDER_SITE_OTHER): Payer: Medicare Other | Admitting: Cardiovascular Disease

## 2016-09-26 VITALS — BP 112/50 | HR 63 | Ht 63.0 in | Wt 196.0 lb

## 2016-09-26 DIAGNOSIS — I10 Essential (primary) hypertension: Secondary | ICD-10-CM

## 2016-09-26 DIAGNOSIS — E7800 Pure hypercholesterolemia, unspecified: Secondary | ICD-10-CM | POA: Diagnosis not present

## 2016-09-26 DIAGNOSIS — I2510 Atherosclerotic heart disease of native coronary artery without angina pectoris: Secondary | ICD-10-CM

## 2016-09-26 DIAGNOSIS — I255 Ischemic cardiomyopathy: Secondary | ICD-10-CM | POA: Diagnosis not present

## 2016-09-26 DIAGNOSIS — I5042 Chronic combined systolic (congestive) and diastolic (congestive) heart failure: Secondary | ICD-10-CM
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
## 2016-09-26 NOTE — Patient Instructions (Signed)

## 2016-09-27 ENCOUNTER — Telehealth: Payer: Self-pay | Admitting: Endocrinology

## 2016-09-27 ENCOUNTER — Other Ambulatory Visit: Payer: Self-pay | Admitting: Cardiovascular Disease

## 2016-09-27 MED ORDER — HYDROCODONE-ACETAMINOPHEN 10-325 MG PO TABS: 1.0000 | ORAL_TABLET | Freq: Four times a day (QID) | ORAL | 0 refills | Status: DC | PRN

## 2016-09-27 NOTE — Telephone Encounter (Signed)
I contacted the patient and advised RX is ready for pick up. Rx placed upfront for the patient to pick up.

## 2016-09-27 NOTE — Telephone Encounter (Signed)
I printed  

## 2016-09-27 NOTE — Telephone Encounter (Signed)
See message and please advise, Thanks!  

## 2016-09-27 NOTE — Telephone Encounter (Signed)
Patient need a refill of medication HYDROcodone-acetaminophen (NORCO) 10-325 MG [place]

## 2016-09-28 ENCOUNTER — Encounter (HOSPITAL_COMMUNITY): Payer: Self-pay

## 2016-10-01 ENCOUNTER — Encounter (HOSPITAL_COMMUNITY)
Admission: RE | Admit: 2016-10-01 | Discharge: 2016-10-01 | Disposition: A | Discharge status: Home / Self Care | Payer: Self-pay | Source: Ambulatory Visit | Attending: Cardiovascular Disease | Admitting: Cardiovascular Disease

## 2016-10-02 ENCOUNTER — Other Ambulatory Visit: Payer: Self-pay | Admitting: Cardiovascular Disease

## 2016-10-02 MED ORDER — HYDROCODONE-ACETAMINOPHEN 10-325 MG PO TABS: 1.0000 | ORAL_TABLET | Freq: Four times a day (QID) | ORAL | 0 refills | Status: DC | PRN

## 2016-10-02 NOTE — Telephone Encounter (Signed)
Ok to [employer].  I printed

## 2016-10-02 NOTE — Telephone Encounter (Signed)
I contacted the patient's wife who is listed on the patient's DPR and advised he could come by and pick up the Hydrocodone refill for 100 tab instead of 30. Patient was advised to bring the 30 tab refill back to our office in order to obtain the refill for the 100 tab. Receptionist's were advised not to give the 100 tab refill without receiving the 30 tab refill back.

## 2016-10-02 NOTE — Telephone Encounter (Signed)
I contacted the patient. Patient advised me he came by the office today and picked up his Hydrocodone rx and it was written for a 30 tablets. Patient stated he normally received 100 tabs and wanted if he could bring the 30 tablet rx back to the office and pick the 100 tab refill up? Please advise, Thanks!

## 2016-10-02 NOTE — Telephone Encounter (Signed)
Pt called and had questions about his prescription, please call back.

## 2016-10-03 ENCOUNTER — Encounter (HOSPITAL_COMMUNITY)
Admission: RE | Admit: 2016-10-03 | Discharge: 2016-10-03 | Disposition: A | Discharge status: Home / Self Care | Payer: Self-pay | Source: Ambulatory Visit | Attending: Cardiovascular Disease | Admitting: Cardiovascular Disease

## 2016-10-05 ENCOUNTER — Encounter (HOSPITAL_COMMUNITY)
Admission: RE | Admit: 2016-10-05 | Discharge: 2016-10-05 | Disposition: A | Discharge status: Home / Self Care | Payer: Self-pay | Source: Ambulatory Visit | Attending: Cardiovascular Disease | Admitting: Cardiovascular Disease

## 2016-10-08 ENCOUNTER — Ambulatory Visit (INDEPENDENT_AMBULATORY_CARE_PROVIDER_SITE_OTHER): Payer: Medicare Other | Admitting: Emergency Medicine

## 2016-10-08 ENCOUNTER — Encounter (HOSPITAL_COMMUNITY): Payer: Self-pay

## 2016-10-08 ENCOUNTER — Encounter: Payer: Self-pay | Admitting: Emergency Medicine

## 2016-10-08 DIAGNOSIS — I255 Ischemic cardiomyopathy: Secondary | ICD-10-CM

## 2016-10-08 DIAGNOSIS — J438 Other emphysema: Secondary | ICD-10-CM

## 2016-10-08 DIAGNOSIS — J301 Allergic rhinitis due to pollen: Secondary | ICD-10-CM

## 2016-10-08 NOTE — Assessment & Plan Note (Signed)
No active wheezing. I believe that his rhinitis is flaring but his COPD is compensated. Continue same BD's, ramp up rhinitis regimen.

## 2016-10-08 NOTE — Assessment & Plan Note (Signed)
  Use your zyrtec daily You may want to restart doing nasal saline washes once a day Increase your Astelin to 2-3x a day to see if this helps your drainage.
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
## 2016-10-08 NOTE — Patient Instructions (Signed)
Please continue your Spiriva and Symbicort  Please continue your prevacid  Use your zyrtec daily You may want to restart doing nasal saline washes once a day Increase your Astelin to 2-3x a day to see if this helps your drainage.  Follow with Dr Lamonte Sakai as already scheduled.

## 2016-10-10 ENCOUNTER — Encounter (HOSPITAL_COMMUNITY): Payer: Self-pay

## 2016-10-12 ENCOUNTER — Encounter (HOSPITAL_COMMUNITY): Payer: Self-pay

## 2016-10-15 ENCOUNTER — Encounter (HOSPITAL_COMMUNITY): Payer: Self-pay

## 2016-10-15 ENCOUNTER — Other Ambulatory Visit: Payer: Self-pay | Admitting: Endocrinology

## 2016-10-17 ENCOUNTER — Encounter (HOSPITAL_COMMUNITY): Payer: Self-pay

## 2016-10-18 ENCOUNTER — Telehealth: Payer: Self-pay

## 2016-10-18 ENCOUNTER — Telehealth: Payer: Self-pay | Admitting: Internal Medicine

## 2016-10-18 ENCOUNTER — Ambulatory Visit (INDEPENDENT_AMBULATORY_CARE_PROVIDER_SITE_OTHER): Payer: Medicare Other | Admitting: Endocrinology

## 2016-10-18 ENCOUNTER — Ambulatory Visit
Admission: RE | Admit: 2016-10-18 | Discharge: 2016-10-18 | Disposition: A | Discharge status: Home / Self Care | Payer: Medicare Other | Source: Ambulatory Visit | Attending: Endocrinology | Admitting: Endocrinology

## 2016-10-18 VITALS — BP 134/64 | HR 67 | Temp 98.0°F | Ht 63.0 in | Wt 193.0 lb

## 2016-10-18 DIAGNOSIS — E1151 Type 2 diabetes mellitus with diabetic peripheral angiopathy without gangrene: Secondary | ICD-10-CM | POA: Diagnosis not present

## 2016-10-18 DIAGNOSIS — I255 Ischemic cardiomyopathy: Secondary | ICD-10-CM

## 2016-10-18 DIAGNOSIS — Z794 Long term (current) use of insulin: Secondary | ICD-10-CM

## 2016-10-18 DIAGNOSIS — R05 Cough: Secondary | ICD-10-CM | POA: Diagnosis not present

## 2016-10-18 DIAGNOSIS — R042 Hemoptysis: Secondary | ICD-10-CM | POA: Diagnosis not present

## 2016-10-18 LAB — POCT GLYCOSYLATED HEMOGLOBIN (HGB A1C): Hemoglobin A1C: 7.3

## 2016-10-18 MED ORDER — INSULIN ASPART 100 UNIT/ML ~~LOC~~ SOLN: SUBCUTANEOUS | 11 refills | Status: DC

## 2016-10-18 MED ORDER — LEVOFLOXACIN 500 MG PO TABS: 500.0000 mg | ORAL_TABLET | Freq: Every day | ORAL | 0 refills | Status: DC

## 2016-10-18 NOTE — Telephone Encounter (Signed)
Ciara with [facility] called with a call report for the chest xray report for 10/18/2016.  Called report stated below: Recurrent or persistent right base airspace disease with adjacent pleural fluid. New right upper lobe patchy opacity. Suspicion of right hilar soft tissue fullness. Constellation of findings for which central right lower lobe/right hilar mass/adenopathy cannot be excluded. Recommend further evaluation with (ideally post-contrast) chest CT.  Dr. Loanne Drilling advised of the call report verbally.

## 2016-10-18 NOTE — Telephone Encounter (Signed)
MW--Dr. Loanne Drilling would like for you to look at this pts cxr and call him

## 2016-10-18 NOTE — Patient Instructions (Addendum)
The hydrocodone pills are good to suppress the cough. A chest x-ray is requested for you today.  We'll let you know about the results. Please stop the Plavix for 2 days, then resume. I hope you feel better soon.  If you don't feel better by next week, please call back.  Please call sooner if you get worse. Please continue the same insulin, except reduce the lunch to 3 units.
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
## 2016-10-19 ENCOUNTER — Encounter (HOSPITAL_COMMUNITY): Payer: Self-pay

## 2016-10-22 ENCOUNTER — Encounter (HOSPITAL_COMMUNITY): Admission: RE | Admit: 2016-10-22 | Payer: Self-pay | Source: Ambulatory Visit

## 2016-10-22 NOTE — Telephone Encounter (Signed)
I sent him a message to go ahead with empiric abx and set up f/u here  Be sure pt has  appt in next 2 weeks

## 2016-10-22 NOTE — Telephone Encounter (Signed)
MW please advise if this cxr has been discussed with Dr. Loanne Drilling.  Thanks!

## 2016-10-22 NOTE — Telephone Encounter (Signed)
Unsure if the contact number in the message is Dr. Cordelia Pen personal number or the office number Routing to Dr. Loanne Drilling

## 2016-10-24 ENCOUNTER — Encounter (HOSPITAL_COMMUNITY)
Admission: RE | Admit: 2016-10-24 | Discharge: 2016-10-24 | Disposition: A | Discharge status: Home / Self Care | Payer: Self-pay | Source: Ambulatory Visit | Attending: Cardiovascular Disease | Admitting: Cardiovascular Disease

## 2016-10-24 DIAGNOSIS — I214 Non-ST elevation (NSTEMI) myocardial infarction: Secondary | ICD-10-CM | POA: Insufficient documentation

## 2016-10-26 ENCOUNTER — Telehealth: Payer: Self-pay | Admitting: Endocrinology

## 2016-10-26 ENCOUNTER — Encounter (HOSPITAL_COMMUNITY)
Admission: RE | Admit: 2016-10-26 | Discharge: 2016-10-26 | Disposition: A | Discharge status: Home / Self Care | Payer: Self-pay | Source: Ambulatory Visit | Attending: Cardiovascular Disease | Admitting: Cardiovascular Disease

## 2016-10-26 NOTE — Telephone Encounter (Signed)
Pt called in for his refill of his Hydrocodone.

## 2016-10-29 ENCOUNTER — Encounter (HOSPITAL_COMMUNITY)
Admission: RE | Admit: 2016-10-29 | Discharge: 2016-10-29 | Disposition: A | Discharge status: Home / Self Care | Payer: Self-pay | Source: Ambulatory Visit | Attending: Cardiovascular Disease | Admitting: Cardiovascular Disease

## 2016-10-29 MED ORDER — HYDROCODONE-ACETAMINOPHEN 10-325 MG PO TABS: 1.0000 | ORAL_TABLET | Freq: Four times a day (QID) | ORAL | 0 refills | Status: DC | PRN

## 2016-10-29 NOTE — Telephone Encounter (Signed)
See message and please advise, Thanks!  

## 2016-10-29 NOTE — Telephone Encounter (Signed)
I printed  

## 2016-10-29 NOTE — Telephone Encounter (Addendum)
I contacted the patient and advised refill is ready for pick up. Refill placed up front.

## 2016-10-31 ENCOUNTER — Encounter (HOSPITAL_COMMUNITY)
Admission: RE | Admit: 2016-10-31 | Discharge: 2016-10-31 | Disposition: A | Discharge status: Home / Self Care | Payer: Self-pay | Source: Ambulatory Visit | Attending: Cardiovascular Disease | Admitting: Cardiovascular Disease

## 2016-11-02 ENCOUNTER — Encounter (HOSPITAL_COMMUNITY)
Admission: RE | Admit: 2016-11-02 | Discharge: 2016-11-02 | Disposition: A | Discharge status: Home / Self Care | Payer: Self-pay | Source: Ambulatory Visit | Attending: Cardiovascular Disease | Admitting: Cardiovascular Disease

## 2016-11-05 ENCOUNTER — Encounter (HOSPITAL_COMMUNITY): Payer: Self-pay

## 2016-11-06 ENCOUNTER — Other Ambulatory Visit: Payer: Self-pay | Admitting: Cardiovascular Disease

## 2016-11-07 ENCOUNTER — Encounter (HOSPITAL_COMMUNITY)
Admission: RE | Admit: 2016-11-07 | Discharge: 2016-11-07 | Disposition: A | Discharge status: Home / Self Care | Payer: Self-pay | Source: Ambulatory Visit | Attending: Cardiovascular Disease | Admitting: Cardiovascular Disease

## 2016-11-09 ENCOUNTER — Encounter (HOSPITAL_COMMUNITY)
Admission: RE | Admit: 2016-11-09 | Discharge: 2016-11-09 | Disposition: A | Discharge status: Home / Self Care | Payer: Self-pay | Source: Ambulatory Visit | Attending: Cardiovascular Disease | Admitting: Cardiovascular Disease

## 2016-11-12 ENCOUNTER — Encounter (HOSPITAL_COMMUNITY): Payer: Self-pay

## 2016-11-14 ENCOUNTER — Encounter (HOSPITAL_COMMUNITY): Admission: RE | Admit: 2016-11-14 | Payer: Self-pay | Source: Ambulatory Visit

## 2016-11-19 ENCOUNTER — Encounter (HOSPITAL_COMMUNITY)
Admission: RE | Admit: 2016-11-19 | Discharge: 2016-11-19 | Disposition: A | Discharge status: Home / Self Care | Payer: Self-pay | Source: Ambulatory Visit | Attending: Cardiovascular Disease | Admitting: Cardiovascular Disease

## 2016-11-20 ENCOUNTER — Other Ambulatory Visit: Payer: Self-pay | Admitting: Endocrinology

## 2016-11-21 ENCOUNTER — Encounter (HOSPITAL_COMMUNITY)
Admission: RE | Admit: 2016-11-21 | Discharge: 2016-11-21 | Disposition: A | Discharge status: Home / Self Care | Payer: Self-pay | Source: Ambulatory Visit | Attending: Cardiovascular Disease | Admitting: Cardiovascular Disease

## 2016-11-23 ENCOUNTER — Telehealth: Payer: Self-pay | Admitting: Endocrinology

## 2016-11-23 ENCOUNTER — Other Ambulatory Visit: Payer: Self-pay | Admitting: Cardiovascular Disease

## 2016-11-23 ENCOUNTER — Encounter (HOSPITAL_COMMUNITY)
Admission: RE | Admit: 2016-11-23 | Discharge: 2016-11-23 | Disposition: A | Discharge status: Home / Self Care | Payer: Self-pay | Source: Ambulatory Visit | Attending: Cardiovascular Disease | Admitting: Cardiovascular Disease

## 2016-11-23 DIAGNOSIS — I214 Non-ST elevation (NSTEMI) myocardial infarction: Secondary | ICD-10-CM | POA: Insufficient documentation

## 2016-11-23 MED ORDER — HYDROCODONE-ACETAMINOPHEN 10-325 MG PO TABS: 1.0000 | ORAL_TABLET | Freq: Four times a day (QID) | ORAL | 0 refills | Status: DC | PRN

## 2016-11-23 NOTE — Telephone Encounter (Signed)
Refill of  HYDROcodone-acetaminophen (NORCO) 10-325 MG tablet 100 tablet

## 2016-11-23 NOTE — Telephone Encounter (Signed)
I printed  

## 2016-11-23 NOTE — Telephone Encounter (Signed)
See message and please advise, Thanks!  

## 2016-11-23 NOTE — Telephone Encounter (Signed)
I contacted the patient and advised refill is ready for pick up. Rx placed up front.

## 2016-11-26 ENCOUNTER — Encounter (HOSPITAL_COMMUNITY)
Admission: RE | Admit: 2016-11-26 | Discharge: 2016-11-26 | Disposition: A | Discharge status: Home / Self Care | Payer: Self-pay | Source: Ambulatory Visit | Attending: Cardiovascular Disease | Admitting: Cardiovascular Disease

## 2016-11-28 ENCOUNTER — Encounter (HOSPITAL_COMMUNITY)
Admission: RE | Admit: 2016-11-28 | Discharge: 2016-11-28 | Disposition: A | Discharge status: Home / Self Care | Payer: Self-pay | Source: Ambulatory Visit | Attending: Cardiovascular Disease | Admitting: Cardiovascular Disease

## 2016-11-30 ENCOUNTER — Encounter (HOSPITAL_COMMUNITY)
Admission: RE | Admit: 2016-11-30 | Discharge: 2016-11-30 | Disposition: A | Discharge status: Home / Self Care | Payer: Self-pay | Source: Ambulatory Visit | Attending: Cardiovascular Disease | Admitting: Cardiovascular Disease

## 2016-12-03 ENCOUNTER — Encounter (HOSPITAL_COMMUNITY): Payer: Self-pay

## 2016-12-04 ENCOUNTER — Other Ambulatory Visit: Payer: Self-pay | Admitting: Endocrinology

## 2016-12-05 ENCOUNTER — Encounter (HOSPITAL_COMMUNITY): Payer: Self-pay

## 2016-12-05 DIAGNOSIS — L570 Actinic keratosis: Secondary | ICD-10-CM | POA: Diagnosis not present

## 2016-12-05 DIAGNOSIS — L821 Other seborrheic keratosis: Secondary | ICD-10-CM | POA: Diagnosis not present

## 2016-12-05 DIAGNOSIS — Z23 Encounter for immunization: Secondary | ICD-10-CM | POA: Diagnosis not present

## 2016-12-07 ENCOUNTER — Encounter (HOSPITAL_COMMUNITY): Payer: Self-pay

## 2016-12-10 ENCOUNTER — Encounter (HOSPITAL_COMMUNITY)
Admission: RE | Admit: 2016-12-10 | Discharge: 2016-12-10 | Disposition: A | Discharge status: Home / Self Care | Payer: Self-pay | Source: Ambulatory Visit | Attending: Cardiovascular Disease | Admitting: Cardiovascular Disease

## 2016-12-12 ENCOUNTER — Encounter (HOSPITAL_COMMUNITY)
Admission: RE | Admit: 2016-12-12 | Discharge: 2016-12-12 | Disposition: A | Discharge status: Home / Self Care | Payer: Self-pay | Source: Ambulatory Visit | Attending: Cardiovascular Disease | Admitting: Cardiovascular Disease

## 2016-12-12 ENCOUNTER — Other Ambulatory Visit: Payer: Self-pay | Admitting: Endocrinology

## 2016-12-13 ENCOUNTER — Encounter: Payer: Self-pay | Admitting: Endocrinology

## 2016-12-14 ENCOUNTER — Encounter (HOSPITAL_COMMUNITY): Payer: Self-pay

## 2016-12-18 ENCOUNTER — Other Ambulatory Visit: Payer: Self-pay | Admitting: Endocrinology

## 2016-12-18 MED ORDER — PANTOPRAZOLE SODIUM 40 MG PO TBEC: 40.0000 mg | DELAYED_RELEASE_TABLET | Freq: Every day | ORAL | 11 refills | Status: DC

## 2016-12-19 ENCOUNTER — Encounter (HOSPITAL_COMMUNITY): Payer: Self-pay

## 2016-12-21 ENCOUNTER — Encounter (HOSPITAL_COMMUNITY): Payer: Self-pay

## 2016-12-26 ENCOUNTER — Encounter (HOSPITAL_COMMUNITY): Payer: Self-pay

## 2016-12-26 DIAGNOSIS — I5032 Chronic diastolic (congestive) heart failure: Secondary | ICD-10-CM | POA: Insufficient documentation

## 2016-12-26 DIAGNOSIS — I509 Heart failure, unspecified: Secondary | ICD-10-CM | POA: Insufficient documentation

## 2016-12-28 ENCOUNTER — Ambulatory Visit (INDEPENDENT_AMBULATORY_CARE_PROVIDER_SITE_OTHER): Payer: Medicare Other | Admitting: Endocrinology

## 2016-12-28 ENCOUNTER — Encounter (HOSPITAL_COMMUNITY): Payer: Self-pay

## 2016-12-28 ENCOUNTER — Encounter: Payer: Self-pay | Admitting: Endocrinology

## 2016-12-28 VITALS — BP 138/80 | HR 70 | Ht 63.0 in | Wt 198.0 lb

## 2016-12-28 DIAGNOSIS — G8929 Other chronic pain: Secondary | ICD-10-CM

## 2016-12-28 DIAGNOSIS — Z794 Long term (current) use of insulin: Secondary | ICD-10-CM

## 2016-12-28 DIAGNOSIS — E1151 Type 2 diabetes mellitus with diabetic peripheral angiopathy without gangrene: Secondary | ICD-10-CM | POA: Diagnosis not present

## 2016-12-28 DIAGNOSIS — M545 Low back pain, unspecified: Secondary | ICD-10-CM | POA: Insufficient documentation

## 2016-12-28 LAB — POCT GLYCOSYLATED HEMOGLOBIN (HGB A1C): Hemoglobin A1C: 6.8

## 2016-12-28 MED ORDER — HYDROCODONE-ACETAMINOPHEN 5-325 MG PO TABS: 1.0000 | ORAL_TABLET | Freq: Four times a day (QID) | ORAL | 0 refills | Status: DC | PRN

## 2016-12-28 NOTE — Patient Instructions (Addendum)
Please continue the same insulin. check your blood sugar twice a day.  vary the time of day when you check, between before the 3 meals, and at bedtime.  also check if you have symptoms of your blood sugar being too high or too low.  please keep a record of the readings and bring it to your next appointment here (or you can bring the meter itself).  You can write it on any piece of paper.  please call us sooner if your blood sugar goes below 70, or if you have a lot of readings over 200. Here is a prescription, for 5 mg hydrocodone.   If you ask the vendor to send me a from for diabetic shoes, I'll sign it.   Please come back for a follow-up appointment in 4 months.
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
## 2016-12-31 ENCOUNTER — Encounter (HOSPITAL_COMMUNITY): Payer: Self-pay

## 2017-01-02 ENCOUNTER — Encounter (HOSPITAL_COMMUNITY)
Admission: RE | Admit: 2017-01-02 | Discharge: 2017-01-02 | Disposition: A | Discharge status: Home / Self Care | Payer: Self-pay | Source: Ambulatory Visit | Attending: Cardiovascular Disease | Admitting: Cardiovascular Disease

## 2017-01-04 ENCOUNTER — Encounter (HOSPITAL_COMMUNITY)
Admission: RE | Admit: 2017-01-04 | Discharge: 2017-01-04 | Disposition: A | Discharge status: Home / Self Care | Payer: Self-pay | Source: Ambulatory Visit | Attending: Cardiovascular Disease | Admitting: Cardiovascular Disease

## 2017-01-07 ENCOUNTER — Encounter (HOSPITAL_COMMUNITY): Payer: Self-pay

## 2017-01-09 ENCOUNTER — Encounter (HOSPITAL_COMMUNITY): Payer: Self-pay

## 2017-01-09 ENCOUNTER — Other Ambulatory Visit: Payer: Self-pay | Admitting: Endocrinology

## 2017-01-11 ENCOUNTER — Encounter (HOSPITAL_COMMUNITY): Payer: Self-pay

## 2017-01-14 ENCOUNTER — Encounter (HOSPITAL_COMMUNITY)
Admission: RE | Admit: 2017-01-14 | Discharge: 2017-01-14 | Disposition: A | Discharge status: Home / Self Care | Payer: Self-pay | Source: Ambulatory Visit | Attending: Cardiovascular Disease | Admitting: Cardiovascular Disease

## 2017-01-16 ENCOUNTER — Encounter (HOSPITAL_COMMUNITY)
Admission: RE | Admit: 2017-01-16 | Discharge: 2017-01-16 | Disposition: A | Discharge status: Home / Self Care | Payer: Self-pay | Source: Ambulatory Visit | Attending: Cardiovascular Disease | Admitting: Cardiovascular Disease

## 2017-01-17 ENCOUNTER — Other Ambulatory Visit: Payer: Self-pay | Admitting: Cardiovascular Disease

## 2017-01-18 ENCOUNTER — Encounter (HOSPITAL_COMMUNITY)
Admission: RE | Admit: 2017-01-18 | Discharge: 2017-01-18 | Disposition: A | Discharge status: Home / Self Care | Payer: Self-pay | Source: Ambulatory Visit | Attending: Cardiovascular Disease | Admitting: Cardiovascular Disease

## 2017-01-21 ENCOUNTER — Ambulatory Visit (INDEPENDENT_AMBULATORY_CARE_PROVIDER_SITE_OTHER)
Admission: RE | Admit: 2017-01-21 | Discharge: 2017-01-21 | Disposition: A | Discharge status: Home / Self Care | Payer: Medicare Other | Source: Ambulatory Visit | Attending: Emergency Medicine | Admitting: Emergency Medicine

## 2017-01-21 ENCOUNTER — Encounter: Payer: Self-pay | Admitting: Emergency Medicine

## 2017-01-21 ENCOUNTER — Encounter (HOSPITAL_COMMUNITY): Payer: Self-pay

## 2017-01-21 ENCOUNTER — Ambulatory Visit (INDEPENDENT_AMBULATORY_CARE_PROVIDER_SITE_OTHER): Payer: Medicare Other | Admitting: Emergency Medicine

## 2017-01-21 VITALS — BP 124/82 | HR 62 | Ht 63.0 in | Wt 198.0 lb

## 2017-01-21 DIAGNOSIS — J439 Emphysema, unspecified: Secondary | ICD-10-CM

## 2017-01-21 DIAGNOSIS — J301 Allergic rhinitis due to pollen: Secondary | ICD-10-CM

## 2017-01-21 DIAGNOSIS — J189 Pneumonia, unspecified organism: Secondary | ICD-10-CM

## 2017-01-21 DIAGNOSIS — I509 Heart failure, unspecified: Secondary | ICD-10-CM | POA: Diagnosis not present
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
## 2017-01-21 NOTE — Patient Instructions (Signed)
Please continue Spiriva, Symbicort, and as needed. Please continue your rhinitis medications you have been taking them We will perform a chest x-ray today to ensure clearance of your recent pneumonia. If there is any residual abnormality seen then we will perform a CT scan of your chest to investigate further. Follow with Dr Lamonte Sakai in 6 months or sooner if you have any problems

## 2017-01-21 NOTE — Assessment & Plan Note (Signed)
Continue same regimen 

## 2017-01-21 NOTE — Assessment & Plan Note (Signed)
Please continue Spiriva, Symbicort, and as needed. Follow with Dr Lamonte Sakai in 6 months or sooner if you have any problems

## 2017-01-21 NOTE — Assessment & Plan Note (Signed)
Recent right-sided pneumonia in October 2017. Abnormal chest x-ray as detailed above. I believe he needs a repeat chest x-ray today to ensure clearance. If there is any residual abnormality then we will perform a CT scan of his chest to further investigate.

## 2017-01-22 ENCOUNTER — Telehealth: Payer: Self-pay | Admitting: Endocrinology

## 2017-01-22 MED ORDER — HYDROCODONE-ACETAMINOPHEN 5-325 MG PO TABS
1.0000 | ORAL_TABLET | Freq: Four times a day (QID) | ORAL | 0 refills | Status: DC | PRN
Start: 2017-01-22 — End: 2017-04-01

## 2017-01-22 NOTE — Telephone Encounter (Signed)
Pt is in need of pain med refill please

## 2017-01-22 NOTE — Telephone Encounter (Signed)
See message and please advise, Thanks!  

## 2017-01-22 NOTE — Telephone Encounter (Signed)
I contacted the patient and advised Rx is ready for pick up. Rx placed up front.  

## 2017-01-22 NOTE — Telephone Encounter (Signed)
I printed  

## 2017-01-23 ENCOUNTER — Encounter (HOSPITAL_COMMUNITY)
Admission: RE | Admit: 2017-01-23 | Discharge: 2017-01-23 | Disposition: A | Discharge status: Home / Self Care | Payer: Self-pay | Source: Ambulatory Visit | Attending: Cardiovascular Disease | Admitting: Cardiovascular Disease

## 2017-01-25 ENCOUNTER — Encounter (HOSPITAL_COMMUNITY)
Admission: RE | Admit: 2017-01-25 | Discharge: 2017-01-25 | Disposition: A | Discharge status: Home / Self Care | Payer: Self-pay | Source: Ambulatory Visit | Attending: Cardiovascular Disease | Admitting: Cardiovascular Disease

## 2017-01-25 DIAGNOSIS — I5032 Chronic diastolic (congestive) heart failure: Secondary | ICD-10-CM | POA: Insufficient documentation

## 2017-01-25 DIAGNOSIS — I509 Heart failure, unspecified: Secondary | ICD-10-CM | POA: Insufficient documentation

## 2017-01-28 ENCOUNTER — Encounter (HOSPITAL_COMMUNITY)
Admission: RE | Admit: 2017-01-28 | Discharge: 2017-01-28 | Disposition: A | Discharge status: Home / Self Care | Payer: Self-pay | Source: Ambulatory Visit | Attending: Cardiovascular Disease | Admitting: Cardiovascular Disease

## 2017-01-30 ENCOUNTER — Encounter (HOSPITAL_COMMUNITY)
Admission: RE | Admit: 2017-01-30 | Discharge: 2017-01-30 | Disposition: A | Discharge status: Home / Self Care | Payer: Self-pay | Source: Ambulatory Visit | Attending: Cardiovascular Disease | Admitting: Cardiovascular Disease

## 2017-02-01 ENCOUNTER — Encounter (HOSPITAL_COMMUNITY): Payer: Self-pay

## 2017-02-04 ENCOUNTER — Encounter (HOSPITAL_COMMUNITY)
Admission: RE | Admit: 2017-02-04 | Discharge: 2017-02-04 | Disposition: A | Discharge status: Home / Self Care | Payer: Self-pay | Source: Ambulatory Visit | Attending: Cardiovascular Disease | Admitting: Cardiovascular Disease

## 2017-02-06 ENCOUNTER — Encounter (HOSPITAL_COMMUNITY)
Admission: RE | Admit: 2017-02-06 | Discharge: 2017-02-06 | Disposition: A | Discharge status: Home / Self Care | Payer: Self-pay | Source: Ambulatory Visit | Attending: Cardiovascular Disease | Admitting: Cardiovascular Disease

## 2017-02-08 ENCOUNTER — Encounter (HOSPITAL_COMMUNITY)
Admission: RE | Admit: 2017-02-08 | Discharge: 2017-02-08 | Disposition: A | Discharge status: Home / Self Care | Payer: Self-pay | Source: Ambulatory Visit | Attending: Cardiovascular Disease | Admitting: Cardiovascular Disease

## 2017-02-11 ENCOUNTER — Encounter (HOSPITAL_COMMUNITY)
Admission: RE | Admit: 2017-02-11 | Discharge: 2017-02-11 | Disposition: A | Discharge status: Home / Self Care | Payer: Self-pay | Source: Ambulatory Visit | Attending: Cardiovascular Disease | Admitting: Cardiovascular Disease

## 2017-02-13 ENCOUNTER — Encounter (HOSPITAL_COMMUNITY)
Admission: RE | Admit: 2017-02-13 | Discharge: 2017-02-13 | Disposition: A | Discharge status: Home / Self Care | Payer: Self-pay | Source: Ambulatory Visit | Attending: Cardiovascular Disease | Admitting: Cardiovascular Disease

## 2017-02-15 ENCOUNTER — Encounter (HOSPITAL_COMMUNITY): Payer: Self-pay

## 2017-02-18 ENCOUNTER — Encounter (HOSPITAL_COMMUNITY)
Admission: RE | Admit: 2017-02-18 | Discharge: 2017-02-18 | Disposition: A | Discharge status: Home / Self Care | Payer: Self-pay | Source: Ambulatory Visit | Attending: Cardiovascular Disease | Admitting: Cardiovascular Disease

## 2017-02-19 ENCOUNTER — Telehealth: Payer: Self-pay | Admitting: Endocrinology

## 2017-02-19 NOTE — Telephone Encounter (Signed)
[employer] handicap placard form place for Dr. Dwyane Dee to sign

## 2017-02-20 ENCOUNTER — Encounter (HOSPITAL_COMMUNITY)
Admission: RE | Admit: 2017-02-20 | Discharge: 2017-02-20 | Disposition: A | Discharge status: Home / Self Care | Payer: Self-pay | Source: Ambulatory Visit | Attending: Cardiovascular Disease | Admitting: Cardiovascular Disease

## 2017-02-22 ENCOUNTER — Encounter (HOSPITAL_COMMUNITY)
Admission: RE | Admit: 2017-02-22 | Discharge: 2017-02-22 | Disposition: A | Discharge status: Home / Self Care | Payer: Medicare Other | Source: Ambulatory Visit | Attending: Cardiovascular Disease | Admitting: Cardiovascular Disease

## 2017-02-22 DIAGNOSIS — I5032 Chronic diastolic (congestive) heart failure: Secondary | ICD-10-CM | POA: Insufficient documentation

## 2017-02-22 DIAGNOSIS — I509 Heart failure, unspecified: Secondary | ICD-10-CM | POA: Insufficient documentation

## 2017-02-25 ENCOUNTER — Telehealth: Payer: Self-pay | Admitting: Endocrinology

## 2017-02-25 ENCOUNTER — Encounter (HOSPITAL_COMMUNITY): Payer: Medicare Other

## 2017-02-25 NOTE — Telephone Encounter (Signed)
Refill of  HYDROcodone-acetaminophen (NORCO/VICODIN) 5-325 MG tablet 100 tablet

## 2017-02-26 ENCOUNTER — Ambulatory Visit (INDEPENDENT_AMBULATORY_CARE_PROVIDER_SITE_OTHER)
Admission: RE | Admit: 2017-02-26 | Discharge: 2017-02-26 | Disposition: A | Discharge status: Home / Self Care | Payer: Medicare Other | Source: Ambulatory Visit | Attending: Adult Health | Admitting: Adult Health

## 2017-02-26 ENCOUNTER — Encounter: Payer: Self-pay | Admitting: Adult Health

## 2017-02-26 ENCOUNTER — Telehealth: Payer: Self-pay | Admitting: Internal Medicine

## 2017-02-26 ENCOUNTER — Emergency Department (HOSPITAL_COMMUNITY)
Admission: EM | Admit: 2017-02-26 | Discharge: 2017-02-27 | Disposition: A | Discharge status: Home / Self Care | Payer: Medicare Other | Attending: Emergency Medicine | Admitting: Emergency Medicine

## 2017-02-26 ENCOUNTER — Emergency Department (HOSPITAL_COMMUNITY): Payer: Medicare Other

## 2017-02-26 ENCOUNTER — Other Ambulatory Visit: Payer: Self-pay | Admitting: Endocrinology

## 2017-02-26 ENCOUNTER — Encounter (HOSPITAL_COMMUNITY): Payer: Self-pay

## 2017-02-26 ENCOUNTER — Ambulatory Visit (INDEPENDENT_AMBULATORY_CARE_PROVIDER_SITE_OTHER): Payer: Medicare Other | Admitting: Adult Health

## 2017-02-26 ENCOUNTER — Other Ambulatory Visit (INDEPENDENT_AMBULATORY_CARE_PROVIDER_SITE_OTHER): Payer: Medicare Other

## 2017-02-26 VITALS — BP 104/56 | HR 71 | Ht 63.0 in | Wt 200.4 lb

## 2017-02-26 DIAGNOSIS — Z794 Long term (current) use of insulin: Secondary | ICD-10-CM | POA: Insufficient documentation

## 2017-02-26 DIAGNOSIS — J439 Emphysema, unspecified: Secondary | ICD-10-CM

## 2017-02-26 DIAGNOSIS — Z87891 Personal history of nicotine dependence: Secondary | ICD-10-CM | POA: Insufficient documentation

## 2017-02-26 DIAGNOSIS — I2510 Atherosclerotic heart disease of native coronary artery without angina pectoris: Secondary | ICD-10-CM | POA: Insufficient documentation

## 2017-02-26 DIAGNOSIS — I252 Old myocardial infarction: Secondary | ICD-10-CM | POA: Insufficient documentation

## 2017-02-26 DIAGNOSIS — E119 Type 2 diabetes mellitus without complications: Secondary | ICD-10-CM | POA: Diagnosis not present

## 2017-02-26 DIAGNOSIS — I5041 Acute combined systolic (congestive) and diastolic (congestive) heart failure: Secondary | ICD-10-CM | POA: Diagnosis not present

## 2017-02-26 DIAGNOSIS — R0602 Shortness of breath: Secondary | ICD-10-CM

## 2017-02-26 DIAGNOSIS — Z7982 Long term (current) use of aspirin: Secondary | ICD-10-CM | POA: Diagnosis not present

## 2017-02-26 DIAGNOSIS — J189 Pneumonia, unspecified organism: Secondary | ICD-10-CM

## 2017-02-26 DIAGNOSIS — Z955 Presence of coronary angioplasty implant and graft: Secondary | ICD-10-CM | POA: Insufficient documentation

## 2017-02-26 DIAGNOSIS — I110 Hypertensive heart disease with heart failure: Secondary | ICD-10-CM | POA: Diagnosis not present

## 2017-02-26 DIAGNOSIS — J449 Chronic obstructive pulmonary disease, unspecified: Secondary | ICD-10-CM | POA: Diagnosis not present

## 2017-02-26 DIAGNOSIS — R0600 Dyspnea, unspecified: Secondary | ICD-10-CM | POA: Diagnosis not present

## 2017-02-26 DIAGNOSIS — Z8673 Personal history of transient ischemic attack (TIA), and cerebral infarction without residual deficits: Secondary | ICD-10-CM | POA: Diagnosis not present

## 2017-02-26 DIAGNOSIS — J181 Lobar pneumonia, unspecified organism: Secondary | ICD-10-CM | POA: Insufficient documentation

## 2017-02-26 LAB — CBC WITH DIFFERENTIAL/PLATELET
BASOS ABS: 0 10*3/uL (ref 0.0–0.1)
Basophils Relative: 0.3 % (ref 0.0–3.0)
EOS ABS: 0.3 10*3/uL (ref 0.0–0.7)
Eosinophils Relative: 2.1 % (ref 0.0–5.0)
HCT: 35.7 % — ABNORMAL LOW (ref 39.0–52.0)
Hemoglobin: 11.9 g/dL — ABNORMAL LOW (ref 13.0–17.0)
LYMPHS ABS: 1.5 10*3/uL (ref 0.7–4.0)
Lymphocytes Relative: 11.7 % — ABNORMAL LOW (ref 12.0–46.0)
MCHC: 33.2 g/dL (ref 30.0–36.0)
MCV: 85.5 fl (ref 78.0–100.0)
Monocytes Absolute: 1.2 10*3/uL — ABNORMAL HIGH (ref 0.1–1.0)
Monocytes Relative: 9.5 % (ref 3.0–12.0)
NEUTROS ABS: 9.8 10*3/uL — AB (ref 1.4–7.7)
NEUTROS PCT: 76.4 % (ref 43.0–77.0)
PLATELETS: 248 10*3/uL (ref 150.0–400.0)
RBC: 4.18 Mil/uL — AB (ref 4.22–5.81)
RDW: 16.5 % — ABNORMAL HIGH (ref 11.5–15.5)
WBC: 12.8 10*3/uL — ABNORMAL HIGH (ref 4.0–10.5)

## 2017-02-26 LAB — BASIC METABOLIC PANEL
ANION GAP: 9 (ref 5–15)
BUN: 42 mg/dL — ABNORMAL HIGH (ref 6–23)
BUN: 48 mg/dL — ABNORMAL HIGH (ref 6–20)
CALCIUM: 8.8 mg/dL (ref 8.4–10.5)
CHLORIDE: 96 mmol/L — AB (ref 101–111)
CO2: 31 meq/L (ref 19–32)
CO2: 28 mmol/L (ref 22–32)
CREATININE: 1.59 mg/dL — AB (ref 0.40–1.50)
Calcium: 8.7 mg/dL — ABNORMAL LOW (ref 8.9–10.3)
Chloride: 97 mEq/L (ref 96–112)
Creatinine, Ser: 1.63 mg/dL — ABNORMAL HIGH (ref 0.61–1.24)
GFR, EST AFRICAN AMERICAN: 46 mL/min — AB (ref >60)
GFR, EST NON AFRICAN AMERICAN: 40 mL/min — AB (ref >60)
GFR: 45.36 mL/min — AB (ref >60.00)
GLUCOSE: 201 mg/dL — AB (ref 70–99)
Glucose, Bld: 173 mg/dL — ABNORMAL HIGH (ref 65–99)
POTASSIUM: 3.9 mmol/L (ref 3.5–5.1)
Potassium: 3.9 mEq/L (ref 3.5–5.1)
SODIUM: 134 meq/L — AB (ref 135–145)
SODIUM: 133 mmol/L — AB (ref 135–145)

## 2017-02-26 LAB — CBC
HEMATOCRIT: 36.7 % — AB (ref 39.0–52.0)
Hemoglobin: 11.9 g/dL — ABNORMAL LOW (ref 13.0–17.0)
MCH: 28 pg (ref 26.0–34.0)
MCHC: 32.4 g/dL (ref 30.0–36.0)
MCV: 86.4 fL (ref 78.0–100.0)
PLATELETS: 234 10*3/uL (ref 150–400)
RBC: 4.25 MIL/uL (ref 4.22–5.81)
RDW: 15.5 % (ref 11.5–15.5)
WBC: 12.2 10*3/uL — ABNORMAL HIGH (ref 4.0–10.5)

## 2017-02-26 LAB — BETA STREP SCREEN: STREPTOCOCCUS, GROUP A SCREEN (DIRECT): NEGATIVE

## 2017-02-26 LAB — I-STAT TROPONIN, ED: Troponin i, poc: 0.03 ng/mL (ref 0.00–0.08)

## 2017-02-26 LAB — BRAIN NATRIURETIC PEPTIDE: PRO B NATRI PEPTIDE: 410 pg/mL — AB (ref 0.0–100.0)

## 2017-02-26 LAB — D-DIMER, QUANTITATIVE (NOT AT ARMC)
D DIMER QUANT: 1 ug{FEU}/mL — AB (ref <0.50)
D DIMER QUANT: 1.52 ug{FEU}/mL — AB (ref 0.00–0.50)

## 2017-02-26 LAB — I-STAT CG4 LACTIC ACID, ED: LACTIC ACID, VENOUS: 1.3 mmol/L (ref 0.5–1.9)

## 2017-02-26 MED ORDER — AZITHROMYCIN 250 MG PO TABS: 250.0000 mg | ORAL_TABLET | Freq: Every day | ORAL | 0 refills | Status: DC

## 2017-02-26 MED ORDER — IOPAMIDOL (ISOVUE-370) INJECTION 76%
INTRAVENOUS | Status: AC
  Administered 2017-02-26: 100 mL
  Filled 2017-02-26: qty 100

## 2017-02-26 MED ORDER — DEXTROSE 5 % IV SOLN
500.0000 mg | Freq: Once | INTRAVENOUS | Status: AC
  Administered 2017-02-26: 500 mg via INTRAVENOUS
  Filled 2017-02-26: qty 500

## 2017-02-26 MED ORDER — SODIUM CHLORIDE 0.9 % IV BOLUS (SEPSIS)
1000.0000 mL | Freq: Once | INTRAVENOUS | Status: AC
  Administered 2017-02-26: 1000 mL via INTRAVENOUS

## 2017-02-26 MED ORDER — DEXTROSE 5 % IV SOLN
1.0000 g | Freq: Once | INTRAVENOUS | Status: AC
  Administered 2017-02-26: 1 g via INTRAVENOUS
  Filled 2017-02-26: qty 10

## 2017-02-26 NOTE — Telephone Encounter (Signed)
Patient called and notified of critical lab value called to on call service of D-dimer positive with increasing SOB, hx PE, and recent long travel. Advised to go to ER for study to rule out blood clot and he agrees with plan of care. Also to be noted CXR recommends CT for follow up.

## 2017-02-26 NOTE — Assessment & Plan Note (Signed)
?   Flare vs Volume overload - exam is unrevealing  Will need further evaluation with labs and cxr  Check D Dimer - long travel /dyspnea  Hold on additional diuresis as on high dose.  Check bnp and bmet  Check cxr   Plan  Patient Instructions  Continue Symbicort and Spiriva .  Continue on Lasix 120mg  Twice daily  .  Low salt diet .  Chest xray and labs today .  Add Flonase 2 puff daily  Saline nasal rinses As needed   Please contact office for sooner follow up if symptoms do not improve or worsen or seek emergency care  Follow up Dr. Lamonte Sakai in 4 weeks and As needed
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
## 2017-02-26 NOTE — Addendum Note (Signed)
Addended by: Parke Poisson E on: 02/26/2017 05:12 PM   Modules accepted: Orders

## 2017-02-26 NOTE — ED Triage Notes (Signed)
Pt was seen at pulmonologist office today for onset 3 days shortness of breath, nasal drainage.  Pt was called 1 hour PTA for abnormal lab results: D Dimer.  Was told to go to ED.  Pt in NAD at triage.

## 2017-02-26 NOTE — Patient Instructions (Signed)
Continue Symbicort and Spiriva .  Continue on Lasix 120mg  Twice daily  .  Low salt diet .  Chest xray and labs today .  Add Flonase 2 puff daily  Saline nasal rinses As needed   Please contact office for sooner follow up if symptoms do not improve or worsen or seek emergency care  Follow up Dr. Lamonte Sakai in 4 weeks and As needed

## 2017-02-27 ENCOUNTER — Encounter (HOSPITAL_COMMUNITY): Payer: Medicare Other

## 2017-02-27 ENCOUNTER — Ambulatory Visit: Payer: Medicare Other | Admitting: Internal Medicine

## 2017-02-27 ENCOUNTER — Other Ambulatory Visit: Payer: Self-pay | Admitting: Endocrinology

## 2017-02-27 NOTE — Progress Notes (Signed)
Spoke with pt and scheduled ov with TP since RB does not have anything in 3 wks//lmr

## 2017-02-28 MED ORDER — HYDROCODONE-ACETAMINOPHEN 5-325 MG PO TABS: 1.0000 | ORAL_TABLET | Freq: Four times a day (QID) | ORAL | 0 refills | Status: DC | PRN

## 2017-02-28 NOTE — Telephone Encounter (Signed)
I printed  

## 2017-02-28 NOTE — Telephone Encounter (Signed)
I contacted the patient and advised Rx is ready for pick up. Rx placed up front.  

## 2017-03-01 ENCOUNTER — Encounter (HOSPITAL_COMMUNITY): Payer: Medicare Other

## 2017-03-03 LAB — CULTURE, BLOOD (ROUTINE X 2): CULTURE: NO GROWTH

## 2017-03-04 ENCOUNTER — Encounter (HOSPITAL_COMMUNITY): Payer: Medicare Other

## 2017-03-04 LAB — CULTURE, BLOOD (ROUTINE X 2): Culture: NO GROWTH

## 2017-03-06 ENCOUNTER — Encounter (HOSPITAL_COMMUNITY): Payer: Medicare Other

## 2017-03-08 ENCOUNTER — Encounter (HOSPITAL_COMMUNITY): Payer: Medicare Other

## 2017-03-11 ENCOUNTER — Encounter (HOSPITAL_COMMUNITY)
Admission: RE | Admit: 2017-03-11 | Discharge: 2017-03-11 | Disposition: A | Discharge status: Home / Self Care | Payer: Medicare Other | Source: Ambulatory Visit | Attending: Cardiovascular Disease | Admitting: Cardiovascular Disease

## 2017-03-13 ENCOUNTER — Encounter (HOSPITAL_COMMUNITY): Payer: Medicare Other

## 2017-03-13 ENCOUNTER — Other Ambulatory Visit: Payer: Self-pay | Admitting: Endocrinology

## 2017-03-15 ENCOUNTER — Encounter (HOSPITAL_COMMUNITY): Payer: Medicare Other

## 2017-03-18 ENCOUNTER — Encounter (HOSPITAL_COMMUNITY): Payer: Medicare Other

## 2017-03-20 ENCOUNTER — Encounter (HOSPITAL_COMMUNITY): Payer: Medicare Other

## 2017-03-21 ENCOUNTER — Ambulatory Visit: Admission: RE | Admit: 2017-03-21 | Payer: Medicare Other | Source: Ambulatory Visit

## 2017-03-21 ENCOUNTER — Ambulatory Visit (INDEPENDENT_AMBULATORY_CARE_PROVIDER_SITE_OTHER)
Admission: RE | Admit: 2017-03-21 | Discharge: 2017-03-21 | Disposition: A | Discharge status: Home / Self Care | Payer: Medicare Other | Source: Ambulatory Visit | Attending: Adult Health | Admitting: Adult Health

## 2017-03-21 ENCOUNTER — Ambulatory Visit (INDEPENDENT_AMBULATORY_CARE_PROVIDER_SITE_OTHER): Payer: Medicare Other | Admitting: Adult Health

## 2017-03-21 ENCOUNTER — Encounter: Payer: Self-pay | Admitting: Adult Health

## 2017-03-21 VITALS — BP 114/60 | HR 70 | Ht 63.0 in | Wt 196.4 lb

## 2017-03-21 DIAGNOSIS — J439 Emphysema, unspecified: Secondary | ICD-10-CM

## 2017-03-21 DIAGNOSIS — J189 Pneumonia, unspecified organism: Secondary | ICD-10-CM

## 2017-03-21 NOTE — Patient Instructions (Signed)
Continue Symbicort and Spiriva .  Saline nasal rinses As needed   Please contact office for sooner follow up if symptoms do not improve or worsen or seek emergency care  Follow up Dr. Lamonte Sakai in 6  Weeks with chest xray  and As needed
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
## 2017-03-22 ENCOUNTER — Encounter (HOSPITAL_COMMUNITY): Payer: Medicare Other

## 2017-03-25 ENCOUNTER — Encounter (HOSPITAL_COMMUNITY): Payer: Medicare Other

## 2017-03-25 DIAGNOSIS — I5032 Chronic diastolic (congestive) heart failure: Secondary | ICD-10-CM | POA: Insufficient documentation

## 2017-03-25 DIAGNOSIS — I509 Heart failure, unspecified: Secondary | ICD-10-CM | POA: Insufficient documentation

## 2017-03-27 ENCOUNTER — Encounter (HOSPITAL_COMMUNITY): Payer: Medicare Other

## 2017-03-28 ENCOUNTER — Telehealth: Payer: Self-pay | Admitting: Endocrinology

## 2017-03-28 NOTE — Telephone Encounter (Signed)
Patient need a refill of medication   HYDROcodone-acetaminophen (NORCO/VICODIN) 5-325 MG tablet 100 tablet

## 2017-03-28 NOTE — Telephone Encounter (Signed)
Okay to refill? Please advise. Thank you! 

## 2017-03-28 NOTE — Assessment & Plan Note (Signed)
Recent flare with PNA -improving   Plan Patient Instructions  Continue Symbicort and Spiriva .  Saline nasal rinses As needed   Please contact office for sooner follow up if symptoms do not improve or worsen or seek emergency care  Follow up Dr. Lamonte Sakai in 6  Weeks with chest xray  and As needed

## 2017-03-28 NOTE — Assessment & Plan Note (Signed)
Right sided PNA improving after abx  cXR shows improvement but not total clearance , will need serial follow up   Plan  Patient Instructions  Continue Symbicort and Spiriva .  Saline nasal rinses As needed   Please contact office for sooner follow up if symptoms do not improve or worsen or seek emergency care  Follow up Dr. Lamonte Sakai in 6  Weeks with chest xray  and As needed

## 2017-03-29 ENCOUNTER — Encounter (HOSPITAL_COMMUNITY): Payer: Medicare Other

## 2017-03-29 ENCOUNTER — Other Ambulatory Visit: Payer: Self-pay

## 2017-03-29 NOTE — Telephone Encounter (Signed)
Please refill x 1  

## 2017-04-01 ENCOUNTER — Encounter (HOSPITAL_COMMUNITY)
Admission: RE | Admit: 2017-04-01 | Discharge: 2017-04-01 | Disposition: A | Discharge status: Home / Self Care | Payer: Medicare Other | Source: Ambulatory Visit | Attending: Cardiovascular Disease | Admitting: Cardiovascular Disease

## 2017-04-01 MED ORDER — HYDROCODONE-ACETAMINOPHEN 5-325 MG PO TABS: 1.0000 | ORAL_TABLET | Freq: Four times a day (QID) | ORAL | 0 refills | Status: DC | PRN

## 2017-04-01 NOTE — Telephone Encounter (Signed)
I printed  

## 2017-04-01 NOTE — Telephone Encounter (Signed)
I contacted the patient and advised rx is ready for pick up. Rx placed up front for the patient to pick up.

## 2017-04-01 NOTE — Telephone Encounter (Signed)
Refill was not completed on 03/29/2017. Would you like me to print the refill?

## 2017-04-02 ENCOUNTER — Other Ambulatory Visit: Payer: Self-pay | Admitting: Endocrinology

## 2017-04-03 ENCOUNTER — Encounter (HOSPITAL_COMMUNITY)
Admission: RE | Admit: 2017-04-03 | Discharge: 2017-04-03 | Disposition: A | Discharge status: Home / Self Care | Payer: Medicare Other | Source: Ambulatory Visit | Attending: Cardiovascular Disease | Admitting: Cardiovascular Disease

## 2017-04-05 ENCOUNTER — Encounter (HOSPITAL_COMMUNITY)
Admission: RE | Admit: 2017-04-05 | Discharge: 2017-04-05 | Disposition: A | Discharge status: Home / Self Care | Payer: Medicare Other | Source: Ambulatory Visit | Attending: Cardiovascular Disease | Admitting: Cardiovascular Disease

## 2017-04-08 ENCOUNTER — Encounter (HOSPITAL_COMMUNITY)
Admission: RE | Admit: 2017-04-08 | Discharge: 2017-04-08 | Disposition: A | Discharge status: Home / Self Care | Payer: Medicare Other | Source: Ambulatory Visit | Attending: Cardiovascular Disease | Admitting: Cardiovascular Disease

## 2017-04-10 ENCOUNTER — Encounter (HOSPITAL_COMMUNITY): Payer: Medicare Other

## 2017-04-12 ENCOUNTER — Encounter (HOSPITAL_COMMUNITY): Payer: Medicare Other

## 2017-04-15 ENCOUNTER — Encounter (HOSPITAL_COMMUNITY): Payer: Medicare Other

## 2017-04-16 ENCOUNTER — Encounter: Payer: Self-pay | Admitting: Emergency Medicine

## 2017-04-16 ENCOUNTER — Ambulatory Visit (INDEPENDENT_AMBULATORY_CARE_PROVIDER_SITE_OTHER): Payer: Medicare Other | Admitting: Emergency Medicine

## 2017-04-16 DIAGNOSIS — J439 Emphysema, unspecified: Secondary | ICD-10-CM | POA: Diagnosis not present

## 2017-04-16 MED ORDER — PREDNISONE 10 MG PO TABS: ORAL_TABLET | ORAL | 0 refills | Status: DC

## 2017-04-16 MED ORDER — AZITHROMYCIN 250 MG PO TABS: ORAL_TABLET | ORAL | 0 refills | Status: DC

## 2017-04-16 NOTE — Assessment & Plan Note (Signed)
Recent RLL PNA. He appears to be developing symptoms consistent with an early exacerbation, believe it would reasonable to treat him. Continue his Spiriva, sym bicort. Treat his allergies, reflux  Please continue your spiriva and symbicort Please use ventolin 2 puffs up to every 4 hours if needed for shortness of breath.  Continue your zyrtec and protonix Take prednisone as directed until completely gone Take azithromycin until completely gone Follow with Dr Lamonte Sakai in 3 months or sooner if you have any problems.

## 2017-04-16 NOTE — Patient Instructions (Addendum)
Please continue your spiriva and symbicort Please use ventolin 2 puffs up to every 4 hours if needed for shortness of breath.  Continue your zyrtec and protonix Take prednisone as directed until completely gone Take azithromycin until completely gone Follow with APP in 2 weeks. CXR at that time to insure clearance of your PNA. Depending on results we may decide to perform a CT chest to get a better look  Follow with Dr Lamonte Sakai in 3 months or sooner if you have any problems.

## 2017-04-16 NOTE — Assessment & Plan Note (Signed)
He has had multiple pneumonias in the right lower lobe, endorses symptoms consistent with reflux ( hiatal hernia and history lap band). I suspect that he may be having occult aspiration. He denies any dysphagia.needs a repeat CXR in a couple weeks to look for resolution infiltrate. If persists then would check a CT chest to get a better look at RLL, scar vs infiltrate vs other opacities.
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
## 2017-04-17 ENCOUNTER — Encounter (HOSPITAL_COMMUNITY): Payer: Medicare Other

## 2017-04-19 ENCOUNTER — Encounter (HOSPITAL_COMMUNITY): Payer: Medicare Other

## 2017-04-22 ENCOUNTER — Encounter (HOSPITAL_COMMUNITY): Payer: Medicare Other

## 2017-04-24 ENCOUNTER — Encounter (HOSPITAL_COMMUNITY): Payer: Self-pay

## 2017-04-24 ENCOUNTER — Encounter: Payer: Self-pay | Admitting: Endocrinology

## 2017-04-24 ENCOUNTER — Ambulatory Visit (INDEPENDENT_AMBULATORY_CARE_PROVIDER_SITE_OTHER): Payer: Medicare Other | Admitting: Endocrinology

## 2017-04-24 VITALS — BP 148/80 | HR 73 | Ht 63.0 in | Wt 199.0 lb

## 2017-04-24 DIAGNOSIS — Z794 Long term (current) use of insulin: Secondary | ICD-10-CM | POA: Diagnosis not present

## 2017-04-24 DIAGNOSIS — M1A9XX0 Chronic gout, unspecified, without tophus (tophi): Secondary | ICD-10-CM

## 2017-04-24 DIAGNOSIS — I509 Heart failure, unspecified: Secondary | ICD-10-CM | POA: Insufficient documentation

## 2017-04-24 DIAGNOSIS — Z125 Encounter for screening for malignant neoplasm of prostate: Secondary | ICD-10-CM | POA: Diagnosis not present

## 2017-04-24 DIAGNOSIS — N183 Chronic kidney disease, stage 3 unspecified: Secondary | ICD-10-CM

## 2017-04-24 DIAGNOSIS — I5032 Chronic diastolic (congestive) heart failure: Secondary | ICD-10-CM | POA: Insufficient documentation

## 2017-04-24 DIAGNOSIS — D649 Anemia, unspecified: Secondary | ICD-10-CM | POA: Insufficient documentation

## 2017-04-24 DIAGNOSIS — D631 Anemia in chronic kidney disease: Secondary | ICD-10-CM

## 2017-04-24 DIAGNOSIS — E1122 Type 2 diabetes mellitus with diabetic chronic kidney disease: Secondary | ICD-10-CM

## 2017-04-24 LAB — CBC WITH DIFFERENTIAL/PLATELET
Basophils Absolute: 0 10*3/uL (ref 0.0–0.1)
Basophils Relative: 0.2 % (ref 0.0–3.0)
EOS ABS: 0.1 10*3/uL (ref 0.0–0.7)
Eosinophils Relative: 0.6 % (ref 0.0–5.0)
HCT: 41.8 % (ref 39.0–52.0)
HEMOGLOBIN: 13.6 g/dL (ref 13.0–17.0)
Lymphocytes Relative: 9.8 % — ABNORMAL LOW (ref 12.0–46.0)
Lymphs Abs: 1 10*3/uL (ref 0.7–4.0)
MCHC: 32.6 g/dL (ref 30.0–36.0)
MCV: 88 fl (ref 78.0–100.0)
MONO ABS: 0.5 10*3/uL (ref 0.1–1.0)
Monocytes Relative: 4.8 % (ref 3.0–12.0)
Neutro Abs: 8.4 10*3/uL — ABNORMAL HIGH (ref 1.4–7.7)
Neutrophils Relative %: 84.6 % — ABNORMAL HIGH (ref 43.0–77.0)
Platelets: 298 10*3/uL (ref 150.0–400.0)
RBC: 4.75 Mil/uL (ref 4.22–5.81)
RDW: 15.3 % (ref 11.5–15.5)
WBC: 9.9 10*3/uL (ref 4.0–10.5)

## 2017-04-24 LAB — LIPID PANEL
CHOL/HDL RATIO: 2
Cholesterol: 153 mg/dL (ref 0–200)
HDL: 66.9 mg/dL (ref >39.00)
LDL CALC: 75 mg/dL (ref 0–99)
NONHDL: 85.96
TRIGLYCERIDES: 56 mg/dL (ref 0.0–149.0)
VLDL: 11.2 mg/dL (ref 0.0–40.0)

## 2017-04-24 LAB — URIC ACID: URIC ACID, SERUM: 4.5 mg/dL (ref 4.0–7.8)

## 2017-04-24 LAB — BASIC METABOLIC PANEL
BUN: 39 mg/dL — ABNORMAL HIGH (ref 6–23)
CALCIUM: 9.3 mg/dL (ref 8.4–10.5)
CO2: 34 meq/L — AB (ref 19–32)
Chloride: 93 mEq/L — ABNORMAL LOW (ref 96–112)
Creatinine, Ser: 1.52 mg/dL — ABNORMAL HIGH (ref 0.40–1.50)
GFR: 47.76 mL/min — ABNORMAL LOW (ref >60.00)
GLUCOSE: 290 mg/dL — AB (ref 70–99)
Potassium: 4.1 mEq/L (ref 3.5–5.1)
SODIUM: 135 meq/L (ref 135–145)

## 2017-04-24 LAB — IBC PANEL
IRON: 46 ug/dL (ref 42–165)
SATURATION RATIOS: 15.1 % — AB (ref 20.0–50.0)
Transferrin: 217 mg/dL (ref 212.0–360.0)

## 2017-04-24 LAB — VITAMIN D 25 HYDROXY (VIT D DEFICIENCY, FRACTURES): VITD: 26.4 ng/mL — AB (ref 30.00–100.00)

## 2017-04-24 LAB — TSH: TSH: 2.12 u[IU]/mL (ref 0.35–4.50)

## 2017-04-24 LAB — PSA, MEDICARE: PSA: 0.56 ng/mL (ref 0.10–4.00)

## 2017-04-24 LAB — POCT GLYCOSYLATED HEMOGLOBIN (HGB A1C): Hemoglobin A1C: 7.8

## 2017-04-24 MED ORDER — HYDROCODONE-ACETAMINOPHEN 5-325 MG PO TABS: 1.0000 | ORAL_TABLET | Freq: Four times a day (QID) | ORAL | 0 refills | Status: DC | PRN

## 2017-04-24 MED ORDER — INSULIN ASPART 100 UNIT/ML ~~LOC~~ SOLN: SUBCUTANEOUS | 11 refills | Status: DC
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
## 2017-04-24 NOTE — Patient Instructions (Addendum)
Please come back for a follow-up appointment in 4 months. When you are on the prednisone next time, take extra novolog: 200's: 3 extra units Over 300: 6 extra units.  check your blood sugar twice a day.  vary the time of day when you check, between before the 3 meals, and at bedtime.  also check if you have symptoms of your blood sugar being too high or too low.  please keep a record of the readings and bring it to your next appointment here (or you can bring the meter itself).  You can write it on any piece of paper.  please call us sooner if your blood sugar goes below 70, or if you have a lot of readings over 200.  blood tests are requested for you today.  We'll let you know about the results.    Please consider having the gastric band converted to gastric bypass: Bariatric Surgery You have so much to gain by losing weight.  You may have already tried every diet and exercise plan imaginable.  And, you may have sought advice from your family physician, too.   Sometimes, in spite of such diligent efforts, you may not be able to achieve long-term results by yourself.  In cases of severe obesity, bariatric or weight loss surgery is a proven method of achieving long-term weight control.  Our Services Our bariatric surgery programs offer our patients new hope and long-term weight-loss solution.  Since introducing our services in 2003, we have conducted more than 2,400 successful procedures.  Our program is designated as a Programmer, multimedia by the Metabolic and Bariatric Surgery Accreditation and Quality Improvement Program (MBSAQIP), a IT trainer that sets rigorous patient safety and outcome standards.  Our program is also designated as a Ecologist by [employer].   Our exceptional weight-loss surgery team specializes in diagnosis, treatment, follow-up care, and ongoing support for our patients with severe weight loss challenges.  We currently offer  laparoscopic sleeve gastrectomy, gastric bypass, and adjustable gastric band (LAP-BAND).    Attend our [place] Choosing to undergo a bariatric procedure is a big decision, and one that should not be taken lightly.  You now have two options in how you learn about weight-loss surgery - in person or online.  Our objective is to ensure you have all of the information that you need to evaluate the advantages and obligations of this life changing procedure.  Please note that you are not alone in this process, and our experienced team is ready to assist and answer all of your questions.  There are several ways to register for a seminar (either on-line or in person): 1)  Call [phone] 2) Go on-line to [facility] and register for either type of seminar.  [email or web address]

## 2017-04-25 LAB — PTH, INTACT AND CALCIUM
CALCIUM: 9 mg/dL (ref 8.6–10.3)
PTH: 43 pg/mL (ref 14–64)

## 2017-04-26 ENCOUNTER — Encounter (HOSPITAL_COMMUNITY): Payer: Self-pay

## 2017-04-29 ENCOUNTER — Telehealth: Payer: Self-pay | Admitting: *Deleted

## 2017-04-29 ENCOUNTER — Ambulatory Visit (INDEPENDENT_AMBULATORY_CARE_PROVIDER_SITE_OTHER)
Admission: RE | Admit: 2017-04-29 | Discharge: 2017-04-29 | Disposition: A | Discharge status: Home / Self Care | Payer: Medicare Other | Source: Ambulatory Visit | Attending: Emergency Medicine | Admitting: Emergency Medicine

## 2017-04-29 ENCOUNTER — Encounter (HOSPITAL_COMMUNITY)
Admission: RE | Admit: 2017-04-29 | Discharge: 2017-04-29 | Disposition: A | Discharge status: Home / Self Care | Payer: Self-pay | Source: Ambulatory Visit | Attending: Cardiovascular Disease | Admitting: Cardiovascular Disease

## 2017-04-29 DIAGNOSIS — J189 Pneumonia, unspecified organism: Secondary | ICD-10-CM

## 2017-04-29 DIAGNOSIS — J90 Pleural effusion, not elsewhere classified: Secondary | ICD-10-CM | POA: Diagnosis not present

## 2017-04-29 NOTE — Telephone Encounter (Signed)
Received call from [facility] w/ cxr Pt there now for cxr to follow up his pneumonia Per the 4.24.18 visit with RB:  Patient Instructions  Please continue your spiriva and symbicort Please use ventolin 2 puffs up to every 4 hours if needed for shortness of breath.  Continue your zyrtec and protonix Take prednisone as directed until completely gone Take azithromycin until completely gone Follow with APP in 2 weeks. CXR at that time to insure clearance of your PNA. Depending on results we may decide to perform a CT chest to get a better look  Follow with Dr Lamonte Sakai in 3 months or sooner if you have any problems.    When it was recommended to the patient that he needs an office visit as recommended to ensure he is clinically improved, pt became agitated stating RB informed him he did not need an office visit and will not schedule an appt.  Looked for RB to ask - not in office.  cxr order placed  Routing to [place] as FYI

## 2017-04-30 NOTE — Telephone Encounter (Signed)
I reviewed his chest x-ray. Right lower lobe infiltrate appears to be resolved. Small amount of costophrenic angle blunting.

## 2017-05-01 ENCOUNTER — Encounter (HOSPITAL_COMMUNITY)
Admission: RE | Admit: 2017-05-01 | Discharge: 2017-05-01 | Disposition: A | Discharge status: Home / Self Care | Payer: Medicare Other | Source: Ambulatory Visit | Attending: Cardiovascular Disease | Admitting: Cardiovascular Disease

## 2017-05-02 ENCOUNTER — Ambulatory Visit: Payer: Medicare Other | Admitting: Emergency Medicine

## 2017-05-03 ENCOUNTER — Encounter (HOSPITAL_COMMUNITY)
Admission: RE | Admit: 2017-05-03 | Discharge: 2017-05-03 | Disposition: A | Discharge status: Home / Self Care | Payer: Self-pay | Source: Ambulatory Visit | Attending: Cardiovascular Disease | Admitting: Cardiovascular Disease

## 2017-05-06 ENCOUNTER — Encounter (HOSPITAL_COMMUNITY)
Admission: RE | Admit: 2017-05-06 | Discharge: 2017-05-06 | Disposition: A | Discharge status: Home / Self Care | Payer: Self-pay | Source: Ambulatory Visit | Attending: Cardiovascular Disease | Admitting: Cardiovascular Disease

## 2017-05-08 ENCOUNTER — Telehealth: Payer: Self-pay | Admitting: Endocrinology

## 2017-05-08 ENCOUNTER — Encounter (HOSPITAL_COMMUNITY)
Admission: RE | Admit: 2017-05-08 | Discharge: 2017-05-08 | Disposition: A | Discharge status: Home / Self Care | Payer: Self-pay | Source: Ambulatory Visit | Attending: Cardiovascular Disease | Admitting: Cardiovascular Disease

## 2017-05-08 NOTE — Telephone Encounter (Signed)
Dr. Rae Halsted (DDS) called because he just saw this patient at his office and said that he will need a tooth extracted, however he knows the patient is on blood thinners, he would like to know how the patient should go about taking this medication prior to the procedure. CB # [phone]

## 2017-05-08 NOTE — Telephone Encounter (Signed)
See message and please advise, Thanks!  

## 2017-05-08 NOTE — Telephone Encounter (Signed)
Please refer this question to cardiol.

## 2017-05-09 NOTE — Telephone Encounter (Signed)
Dentist notified

## 2017-05-10 ENCOUNTER — Encounter (HOSPITAL_COMMUNITY)
Admission: RE | Admit: 2017-05-10 | Discharge: 2017-05-10 | Disposition: A | Discharge status: Home / Self Care | Payer: Self-pay | Source: Ambulatory Visit | Attending: Cardiovascular Disease | Admitting: Cardiovascular Disease

## 2017-05-13 ENCOUNTER — Encounter (HOSPITAL_COMMUNITY)
Admission: RE | Admit: 2017-05-13 | Discharge: 2017-05-13 | Disposition: A | Discharge status: Home / Self Care | Payer: Self-pay | Source: Ambulatory Visit | Attending: Cardiovascular Disease | Admitting: Cardiovascular Disease

## 2017-05-15 ENCOUNTER — Encounter (HOSPITAL_COMMUNITY)
Admission: RE | Admit: 2017-05-15 | Discharge: 2017-05-15 | Disposition: A | Discharge status: Home / Self Care | Payer: Self-pay | Source: Ambulatory Visit | Attending: Cardiovascular Disease | Admitting: Cardiovascular Disease

## 2017-05-17 ENCOUNTER — Encounter (HOSPITAL_COMMUNITY)
Admission: RE | Admit: 2017-05-17 | Discharge: 2017-05-17 | Disposition: A | Discharge status: Home / Self Care | Payer: Self-pay | Source: Ambulatory Visit | Attending: Cardiovascular Disease | Admitting: Cardiovascular Disease

## 2017-05-22 ENCOUNTER — Encounter (HOSPITAL_COMMUNITY)
Admission: RE | Admit: 2017-05-22 | Discharge: 2017-05-22 | Disposition: A | Discharge status: Home / Self Care | Payer: Self-pay | Source: Ambulatory Visit | Attending: Cardiovascular Disease | Admitting: Cardiovascular Disease

## 2017-05-23 ENCOUNTER — Other Ambulatory Visit: Payer: Self-pay | Admitting: Endocrinology

## 2017-05-24 ENCOUNTER — Encounter (HOSPITAL_COMMUNITY): Payer: Self-pay

## 2017-05-24 DIAGNOSIS — I5032 Chronic diastolic (congestive) heart failure: Secondary | ICD-10-CM | POA: Insufficient documentation

## 2017-05-24 DIAGNOSIS — I509 Heart failure, unspecified: Secondary | ICD-10-CM | POA: Insufficient documentation

## 2017-05-24 NOTE — Telephone Encounter (Signed)
please call patient: Because of an interaction with trazodone, we should reduce or stop the citalopram.  Which do you prefer?

## 2017-05-24 NOTE — Telephone Encounter (Signed)
Spoke with the patient and he would prefer to reduce for now

## 2017-05-25 ENCOUNTER — Other Ambulatory Visit: Payer: Self-pay | Admitting: Endocrinology

## 2017-05-25 MED ORDER — FINASTERIDE 5 MG PO TABS: ORAL_TABLET | ORAL | 3 refills | Status: DC

## 2017-05-25 MED ORDER — CITALOPRAM HYDROBROMIDE 10 MG PO TABS: 10.0000 mg | ORAL_TABLET | Freq: Every day | ORAL | 3 refills | Status: DC

## 2017-05-27 ENCOUNTER — Encounter (HOSPITAL_COMMUNITY): Payer: Self-pay

## 2017-05-28 ENCOUNTER — Other Ambulatory Visit: Payer: Self-pay | Admitting: Endocrinology

## 2017-05-28 ENCOUNTER — Telehealth: Payer: Self-pay | Admitting: Endocrinology

## 2017-05-28 MED ORDER — HYDROCODONE-ACETAMINOPHEN 5-325 MG PO TABS: 1.0000 | ORAL_TABLET | Freq: Four times a day (QID) | ORAL | 0 refills | Status: DC | PRN

## 2017-05-28 NOTE — Telephone Encounter (Signed)
**  Remind patient they can make refill requests via MyChart**  Medication refill request (Name & Dosage):  HYDROcodone-acetaminophen (NORCO/VICODIN) 5-325 MG tablet  Please call patient once rx is ready for pick up at the office.

## 2017-05-28 NOTE — Telephone Encounter (Signed)
error 

## 2017-05-28 NOTE — Telephone Encounter (Signed)
Please see message. °

## 2017-05-28 NOTE — Telephone Encounter (Signed)
Pt notified Rx ready for pickup. Rx printed and signed.  

## 2017-05-28 NOTE — Telephone Encounter (Signed)
Rx printed waiting to be signed per Dr Loanne Drilling.

## 2017-05-29 ENCOUNTER — Encounter (HOSPITAL_COMMUNITY): Payer: Self-pay

## 2017-05-29 NOTE — Telephone Encounter (Signed)
Please reduce to 10 mg qd.  I have sent a prescription to your pharmacy.

## 2017-05-29 NOTE — Telephone Encounter (Signed)
Spoke to his wife and she will tell the patient

## 2017-05-29 NOTE — Telephone Encounter (Signed)
What is this to be reduced to?

## 2017-05-31 ENCOUNTER — Encounter (HOSPITAL_COMMUNITY): Payer: Self-pay

## 2017-05-31 ENCOUNTER — Encounter: Payer: Self-pay | Admitting: Endocrinology

## 2017-05-31 ENCOUNTER — Ambulatory Visit (INDEPENDENT_AMBULATORY_CARE_PROVIDER_SITE_OTHER): Payer: Medicare Other | Admitting: Endocrinology

## 2017-05-31 VITALS — BP 110/58 | HR 75 | Ht 63.0 in | Wt 194.0 lb

## 2017-05-31 DIAGNOSIS — H6121 Impacted cerumen, right ear: Secondary | ICD-10-CM
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
## 2017-05-31 NOTE — Patient Instructions (Signed)
Put a few drops of peroxide into the right ear.  Leave for 15 minutes, then rinse out.   The left ear is clear, so needs no treatment.

## 2017-06-03 ENCOUNTER — Encounter (HOSPITAL_COMMUNITY): Payer: Self-pay

## 2017-06-05 ENCOUNTER — Other Ambulatory Visit: Payer: Self-pay | Admitting: Endocrinology

## 2017-06-05 ENCOUNTER — Encounter (HOSPITAL_COMMUNITY): Payer: Self-pay

## 2017-06-07 ENCOUNTER — Encounter (HOSPITAL_COMMUNITY): Payer: Self-pay

## 2017-06-10 ENCOUNTER — Encounter (HOSPITAL_COMMUNITY): Payer: Self-pay

## 2017-06-12 ENCOUNTER — Encounter (HOSPITAL_COMMUNITY): Payer: Self-pay

## 2017-06-14 ENCOUNTER — Telehealth (HOSPITAL_COMMUNITY): Payer: Self-pay | Admitting: *Deleted

## 2017-06-14 ENCOUNTER — Encounter (HOSPITAL_COMMUNITY): Payer: Medicare Other

## 2017-06-17 ENCOUNTER — Encounter (HOSPITAL_COMMUNITY)
Admission: RE | Admit: 2017-06-17 | Discharge: 2017-06-17 | Disposition: A | Discharge status: Home / Self Care | Payer: Self-pay | Source: Ambulatory Visit | Attending: Cardiovascular Disease | Admitting: Cardiovascular Disease

## 2017-06-19 ENCOUNTER — Encounter (HOSPITAL_COMMUNITY)
Admission: RE | Admit: 2017-06-19 | Discharge: 2017-06-19 | Disposition: A | Discharge status: Home / Self Care | Payer: Self-pay | Source: Ambulatory Visit | Attending: Cardiovascular Disease | Admitting: Cardiovascular Disease

## 2017-06-21 ENCOUNTER — Encounter (HOSPITAL_COMMUNITY)
Admission: RE | Admit: 2017-06-21 | Discharge: 2017-06-21 | Disposition: A | Discharge status: Home / Self Care | Payer: Self-pay | Source: Ambulatory Visit | Attending: Cardiovascular Disease | Admitting: Cardiovascular Disease

## 2017-06-24 ENCOUNTER — Encounter (HOSPITAL_COMMUNITY)
Admission: RE | Admit: 2017-06-24 | Discharge: 2017-06-24 | Disposition: A | Discharge status: Home / Self Care | Payer: Self-pay | Source: Ambulatory Visit | Attending: Cardiovascular Disease | Admitting: Cardiovascular Disease

## 2017-06-24 DIAGNOSIS — I509 Heart failure, unspecified: Secondary | ICD-10-CM | POA: Insufficient documentation

## 2017-06-25 ENCOUNTER — Other Ambulatory Visit: Payer: Self-pay | Admitting: Endocrinology

## 2017-06-25 ENCOUNTER — Telehealth: Payer: Self-pay | Admitting: Endocrinology

## 2017-06-25 MED ORDER — HYDROCODONE-ACETAMINOPHEN 5-325 MG PO TABS: 1.0000 | ORAL_TABLET | Freq: Four times a day (QID) | ORAL | 0 refills | Status: DC | PRN

## 2017-06-25 NOTE — Telephone Encounter (Signed)
Pt needs refill on hydrocodone please

## 2017-06-25 NOTE — Telephone Encounter (Signed)
I have accessed the  controlled substances database today.  No suspicious activity I printed

## 2017-06-27 NOTE — Telephone Encounter (Signed)
I contacted the patient and advised via voicemail his prescription is ready for pick up. Rx placed upfront for the patient.

## 2017-06-28 ENCOUNTER — Other Ambulatory Visit: Payer: Self-pay | Admitting: Endocrinology

## 2017-06-28 ENCOUNTER — Encounter (HOSPITAL_COMMUNITY)
Admission: RE | Admit: 2017-06-28 | Discharge: 2017-06-28 | Disposition: A | Discharge status: Home / Self Care | Payer: Self-pay | Source: Ambulatory Visit | Attending: Cardiovascular Disease | Admitting: Cardiovascular Disease

## 2017-07-01 ENCOUNTER — Encounter (HOSPITAL_COMMUNITY): Payer: Self-pay

## 2017-07-03 ENCOUNTER — Encounter (HOSPITAL_COMMUNITY): Payer: Self-pay

## 2017-07-05 ENCOUNTER — Encounter (HOSPITAL_COMMUNITY)
Admission: RE | Admit: 2017-07-05 | Discharge: 2017-07-05 | Disposition: A | Discharge status: Home / Self Care | Payer: Self-pay | Source: Ambulatory Visit | Attending: Cardiovascular Disease | Admitting: Cardiovascular Disease

## 2017-07-08 ENCOUNTER — Encounter (HOSPITAL_COMMUNITY)
Admission: RE | Admit: 2017-07-08 | Discharge: 2017-07-08 | Disposition: A | Discharge status: Home / Self Care | Payer: Self-pay | Source: Ambulatory Visit | Attending: Cardiovascular Disease | Admitting: Cardiovascular Disease

## 2017-07-09 DIAGNOSIS — E119 Type 2 diabetes mellitus without complications: Secondary | ICD-10-CM | POA: Diagnosis not present

## 2017-07-09 LAB — HM DIABETES EYE EXAM

## 2017-07-10 ENCOUNTER — Encounter (HOSPITAL_COMMUNITY): Payer: Self-pay

## 2017-07-12 ENCOUNTER — Encounter (HOSPITAL_COMMUNITY)
Admission: RE | Admit: 2017-07-12 | Discharge: 2017-07-12 | Disposition: A | Discharge status: Home / Self Care | Payer: Self-pay | Source: Ambulatory Visit | Attending: Cardiovascular Disease | Admitting: Cardiovascular Disease

## 2017-07-15 ENCOUNTER — Encounter (HOSPITAL_COMMUNITY)
Admission: RE | Admit: 2017-07-15 | Discharge: 2017-07-15 | Disposition: A | Discharge status: Home / Self Care | Payer: Self-pay | Source: Ambulatory Visit | Attending: Cardiovascular Disease | Admitting: Cardiovascular Disease

## 2017-07-16 ENCOUNTER — Encounter: Payer: Self-pay | Admitting: Emergency Medicine

## 2017-07-16 ENCOUNTER — Ambulatory Visit (INDEPENDENT_AMBULATORY_CARE_PROVIDER_SITE_OTHER): Payer: Medicare Other | Admitting: Emergency Medicine

## 2017-07-16 DIAGNOSIS — J439 Emphysema, unspecified: Secondary | ICD-10-CM | POA: Diagnosis not present

## 2017-07-16 DIAGNOSIS — R1310 Dysphagia, unspecified: Secondary | ICD-10-CM | POA: Diagnosis not present

## 2017-07-16 DIAGNOSIS — J301 Allergic rhinitis due to pollen: Secondary | ICD-10-CM

## 2017-07-16 NOTE — Progress Notes (Signed)
   Subjective:    Patient ID: [name]., male    DOB: 11-06-1941, 76 y.o.   MRN: [id number]  HPI 76 year old former smoker (14 pack years), with a history of coronary disease, diastolic CHF, diabetes. He has undergone lap band surgery. He was followed by Dr Gwenette Greet for COPD. Also with a history of chronic rhinitis, GERD. Course has been characterized by recurrent episodes of bronchitis and right lower lobe pneumonia. I treated him for this in April 2018. He denies any overt aspiration symptoms but he is suspicious that he may have some LPR and possible aspiration, related to the lap-band. A repeat chest x-ray 04/29/17 showed that his right lower lobe infiltrate had cleared. Current bronchodilators are Spiriva, Symbicort, albuterol as needed. He has been using Zyrtec, Astelin nasal spray both as needed. He has been participate in and pulmonary rehabilitation. Cough much improved.    Review of Systems As per HPI     Objective:   Physical Exam Vitals:   07/16/17 1525  BP: 126/68  Pulse: 66  SpO2: 94%  Weight: 193 lb (87.5 kg)  Height: 5\' 3"  (1.6 m)   Gen: Pleasant, overwt, in no distress,  normal affect  ENT: No lesions,  mouth clear,  oropharynx clear, no postnasal drip  Neck: No JVD, no stridor  Lungs: No use of accessory muscles, distant, Few right basilar inspiratory crackles  CV: regular, no M gallops, trace peripheral edema  Musculoskeletal: No deformities, no cyanosis or clubbing  Neuro: alert, non focal  Skin: Warm, no lesions or rashes       Assessment & Plan:  COPD (chronic obstructive pulmonary disease) with emphysema Continue your Spiriva and Symbicort as you are taking them.  Take albuterol 2 puffs up to every 4 hours if needed for shortness of breath.  Get the flu shot when it is available in the fall Follow with Dr Lamonte Sakai in late September 2018  Allergic rhinitis Continue Zyrtec and Astelin nasal spray as you have been using them. If your become more  bothersome in the fall that we consider adding a steroid nasal spray to this regimen.  Difficulty swallowing solids With some circumstantial evidence for aspiration and recurrent right lower lobe pneumonias.   Please continue to be careful with your swallowing and your risk for possible reflux and aspiration.   [name], MD, PhD 07/16/2017, 3:41 PM [facility] [phone] or if no answer [phone]

## 2017-07-16 NOTE — Assessment & Plan Note (Signed)
Continue Zyrtec and Astelin nasal spray as you have been using them. If your become more bothersome in the fall that we consider adding a steroid nasal spray to this regimen.

## 2017-07-16 NOTE — Assessment & Plan Note (Signed)
With some circumstantial evidence for aspiration and recurrent right lower lobe pneumonias.   Please continue to be careful with your swallowing and your risk for possible reflux and aspiration.

## 2017-07-16 NOTE — Assessment & Plan Note (Signed)
Continue your Spiriva and Symbicort as you are taking them.  Take albuterol 2 puffs up to every 4 hours if needed for shortness of breath.  Get the flu shot when it is available in the fall Follow with Dr Lamonte Sakai in late September 2018

## 2017-07-16 NOTE — Patient Instructions (Addendum)
Please continue to be careful with your swallowing and your risk for possible reflux and aspiration.  Continue your Spiriva and Symbicort as you are taking them.  Take albuterol 2 puffs up to every 4 hours if needed for shortness of breath.  Please continue your zyrtec and astelin nasal spray as you are taking them .  Get the flu shot when it is available in the fall Follow with Dr Lamonte Sakai in late September 2018

## 2017-07-17 ENCOUNTER — Encounter (HOSPITAL_COMMUNITY)
Admission: RE | Admit: 2017-07-17 | Discharge: 2017-07-17 | Disposition: A | Discharge status: Home / Self Care | Payer: Self-pay | Source: Ambulatory Visit | Attending: Cardiovascular Disease | Admitting: Cardiovascular Disease

## 2017-07-19 ENCOUNTER — Encounter (HOSPITAL_COMMUNITY)
Admission: RE | Admit: 2017-07-19 | Discharge: 2017-07-19 | Disposition: A | Discharge status: Home / Self Care | Payer: Self-pay | Source: Ambulatory Visit | Attending: Cardiovascular Disease | Admitting: Cardiovascular Disease

## 2017-07-22 ENCOUNTER — Encounter (HOSPITAL_COMMUNITY): Payer: Self-pay

## 2017-07-24 ENCOUNTER — Encounter (HOSPITAL_COMMUNITY): Payer: Self-pay | Attending: Cardiovascular Disease

## 2017-07-24 DIAGNOSIS — I509 Heart failure, unspecified: Secondary | ICD-10-CM | POA: Insufficient documentation

## 2017-07-25 ENCOUNTER — Other Ambulatory Visit: Payer: Self-pay

## 2017-07-25 ENCOUNTER — Telehealth: Payer: Self-pay | Admitting: Endocrinology

## 2017-07-25 MED ORDER — HYDROCODONE-ACETAMINOPHEN 5-325 MG PO TABS: 1.0000 | ORAL_TABLET | Freq: Four times a day (QID) | ORAL | 0 refills | Status: DC | PRN

## 2017-07-25 NOTE — Telephone Encounter (Signed)
**  Remind patient they can make refill requests via MyChart**  Medication refill request (Name & Dosage):  HYDROcodone-acetaminophen (NORCO/VICODIN) 5-325 MG tablet  Preferred pharmacy (Name & Address):  [facility]   Other comments (if applicable):   Call once rx is ready for pick up

## 2017-07-25 NOTE — Telephone Encounter (Signed)
We can prescribe 60 tablets and he can get the rest from Dr. Loanne Drilling

## 2017-07-25 NOTE — Telephone Encounter (Signed)
Please determine what he is taking this for

## 2017-07-25 NOTE — Telephone Encounter (Signed)
Fax sent to pharmacy.  

## 2017-07-25 NOTE — Telephone Encounter (Signed)
It looks like its for moderate pain because he has neuropathy among many other issues. That is the one thing I see related directly for chronic pain.

## 2017-07-25 NOTE — Telephone Encounter (Signed)
[place] prescription printed and I will bring to you to sign!

## 2017-07-25 NOTE — Telephone Encounter (Signed)
Routing to you °

## 2017-07-26 ENCOUNTER — Encounter (HOSPITAL_COMMUNITY): Payer: Self-pay

## 2017-07-29 ENCOUNTER — Telehealth: Payer: Self-pay | Admitting: Cardiovascular Disease

## 2017-07-29 ENCOUNTER — Encounter (HOSPITAL_COMMUNITY): Payer: Self-pay

## 2017-07-29 DIAGNOSIS — E669 Obesity, unspecified: Secondary | ICD-10-CM | POA: Diagnosis not present

## 2017-07-29 DIAGNOSIS — J189 Pneumonia, unspecified organism: Secondary | ICD-10-CM | POA: Diagnosis not present

## 2017-07-29 DIAGNOSIS — I2510 Atherosclerotic heart disease of native coronary artery without angina pectoris: Secondary | ICD-10-CM | POA: Diagnosis not present

## 2017-07-29 DIAGNOSIS — I10 Essential (primary) hypertension: Secondary | ICD-10-CM | POA: Diagnosis not present

## 2017-07-29 DIAGNOSIS — N2581 Secondary hyperparathyroidism of renal origin: Secondary | ICD-10-CM | POA: Diagnosis not present

## 2017-07-29 DIAGNOSIS — N183 Chronic kidney disease, stage 3 (moderate): Secondary | ICD-10-CM | POA: Diagnosis not present

## 2017-07-29 NOTE — Telephone Encounter (Signed)
AGree with appt with Cecille Rubin. Thanks for setting this up. Gerald Stabs

## 2017-07-29 NOTE — Telephone Encounter (Signed)
Spoke with patient who states he has bilateral foot swelling and SOB that started last Thursday. States he has not these symptoms occur in many months. He reports weight increase of 2 lb since Thursday. He verbalized compliance with his medications and denies increase of salt in diet. Reports he is staying well hydrated and drinks 64+ oz water daily. Denies chest pain; states he woke up very diaphoretic last Thursday morning on the day the symptoms began but has not had that occur since that time. Reports cough with white-colored phlegm; denies recent s/s of viral or bacterial illness. I offered him an appointment with Truitt Merle, NP for tomorrow, 8/7 at 3:00 and advised I will forward message to Dr. Angelena Form for advice. I advised that if Dr. Angelena Form has any additional advice, that we will call him back. [name]

## 2017-07-29 NOTE — Telephone Encounter (Signed)
New message   Pt c/o swelling: STAT is pt has developed SOB within 24 hours  1. How long have you been experiencing swelling? Started Thursday or Friday  2. Where is the swelling located? feet  3.  Are you currently taking a "fluid pill"? yes  4.  Are you currently SOB? Some past weekend but not now  5.  Have you traveled recently? no

## 2017-07-30 ENCOUNTER — Ambulatory Visit: Payer: Medicare Other | Admitting: Nurse Practitioner
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
## 2017-07-30 NOTE — Telephone Encounter (Signed)
Patient calling to see if Rx is ready. Patient stated he will be by later this afternoon.

## 2017-07-31 ENCOUNTER — Encounter: Payer: Self-pay | Admitting: Nurse Practitioner

## 2017-07-31 ENCOUNTER — Ambulatory Visit (INDEPENDENT_AMBULATORY_CARE_PROVIDER_SITE_OTHER): Payer: Medicare Other | Admitting: Nurse Practitioner

## 2017-07-31 ENCOUNTER — Telehealth: Payer: Self-pay | Admitting: Emergency Medicine

## 2017-07-31 ENCOUNTER — Encounter (HOSPITAL_COMMUNITY): Payer: Self-pay

## 2017-07-31 ENCOUNTER — Ambulatory Visit
Admission: RE | Admit: 2017-07-31 | Discharge: 2017-07-31 | Disposition: A | Discharge status: Home / Self Care | Payer: Medicare Other | Source: Ambulatory Visit | Attending: Nurse Practitioner | Admitting: Nurse Practitioner

## 2017-07-31 VITALS — BP 136/60 | HR 69 | Ht 63.0 in | Wt 196.2 lb

## 2017-07-31 DIAGNOSIS — R0602 Shortness of breath: Secondary | ICD-10-CM

## 2017-07-31 DIAGNOSIS — I2589 Other forms of chronic ischemic heart disease: Secondary | ICD-10-CM

## 2017-07-31 DIAGNOSIS — J690 Pneumonitis due to inhalation of food and vomit: Secondary | ICD-10-CM

## 2017-07-31 DIAGNOSIS — R05 Cough: Secondary | ICD-10-CM | POA: Diagnosis not present

## 2017-07-31 DIAGNOSIS — I5042 Chronic combined systolic (congestive) and diastolic (congestive) heart failure: Secondary | ICD-10-CM

## 2017-07-31 DIAGNOSIS — I255 Ischemic cardiomyopathy: Secondary | ICD-10-CM

## 2017-07-31 DIAGNOSIS — I10 Essential (primary) hypertension: Secondary | ICD-10-CM

## 2017-07-31 LAB — BASIC METABOLIC PANEL
BUN/Creatinine Ratio: 19 (ref 10–24)
BUN: 25 mg/dL (ref 8–27)
CO2: 30 mmol/L — ABNORMAL HIGH (ref 20–29)
Calcium: 8.8 mg/dL (ref 8.6–10.2)
Chloride: 93 mmol/L — ABNORMAL LOW (ref 96–106)
Creatinine, Ser: 1.31 mg/dL — ABNORMAL HIGH (ref 0.76–1.27)
GFR calc Af Amer: 61 mL/min/{1.73_m2} (ref >59)
GFR calc non Af Amer: 53 mL/min/{1.73_m2} — ABNORMAL LOW (ref >59)
Glucose: 112 mg/dL — ABNORMAL HIGH (ref 65–99)
Potassium: 3.5 mmol/L (ref 3.5–5.2)
Sodium: 139 mmol/L (ref 134–144)

## 2017-07-31 LAB — CBC
Hematocrit: 34.4 % — ABNORMAL LOW (ref 37.5–51.0)
Hemoglobin: 12 g/dL — ABNORMAL LOW (ref 13.0–17.7)
MCH: 29.1 pg (ref 26.6–33.0)
MCHC: 34.9 g/dL (ref 31.5–35.7)
MCV: 83 fL (ref 79–97)
Platelets: 279 10*3/uL (ref 150–379)
RBC: 4.13 x10E6/uL — ABNORMAL LOW (ref 4.14–5.80)
RDW: 14 % (ref 12.3–15.4)
WBC: 12.3 10*3/uL — ABNORMAL HIGH (ref 3.4–10.8)

## 2017-07-31 LAB — PRO B NATRIURETIC PEPTIDE: NT-Pro BNP: 2113 pg/mL — ABNORMAL HIGH (ref 0–486)

## 2017-07-31 LAB — HEPATIC FUNCTION PANEL
ALT: 20 IU/L (ref 0–44)
AST: 15 IU/L (ref 0–40)
Albumin: 3.2 g/dL — ABNORMAL LOW (ref 3.5–4.8)
Alkaline Phosphatase: 78 IU/L (ref 39–117)
Bilirubin Total: 0.3 mg/dL (ref 0.0–1.2)
Bilirubin, Direct: 0.11 mg/dL (ref 0.00–0.40)
Total Protein: 5.5 g/dL — ABNORMAL LOW (ref 6.0–8.5)

## 2017-07-31 MED ORDER — DOXYCYCLINE HYCLATE 100 MG PO CAPS: 100.0000 mg | ORAL_CAPSULE | Freq: Two times a day (BID) | ORAL | 0 refills | Status: DC

## 2017-07-31 NOTE — Telephone Encounter (Signed)
Spoke with Davy Pique at [facility], states that pt's cxr from today's ov at their office was abnormal, showed PNA>  Requesting that pt be seen this week.  Pt scheduled to see MW on Friday at 9:15.  Nothing further needed.
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
## 2017-07-31 NOTE — Patient Instructions (Addendum)
We will be checking the following labs today - BMET, CBC, BNP and HPF   Medication Instructions:    Continue with your current medicines.   But I want you to increase your Lasix to 4 pills in the Am and 3 pills in the afternoon for 4 days - then back to your usual schedule    I am sending in a RX for Doxycycline 100 mg to take twice a day   Testing/Procedures To Be Arranged:  CXR - Please go to [facility] to [place] on the first floor for a chest Xray - you may walk in.     Follow-Up:   Will see how your studies turn out - if you fail to improve - call Dr. Lamonte Sakai    Other Special Instructions:   N/A    If you need a refill on your cardiac medications before your next appointment, please call your pharmacy.   Call the [place] office at [phone] if you have any questions, problems or concerns.

## 2017-08-02 ENCOUNTER — Encounter: Payer: Self-pay | Admitting: Internal Medicine

## 2017-08-02 ENCOUNTER — Ambulatory Visit (INDEPENDENT_AMBULATORY_CARE_PROVIDER_SITE_OTHER): Payer: Medicare Other | Admitting: Internal Medicine

## 2017-08-02 ENCOUNTER — Telehealth: Payer: Self-pay | Admitting: *Deleted

## 2017-08-02 ENCOUNTER — Encounter (HOSPITAL_COMMUNITY): Payer: Self-pay

## 2017-08-02 VITALS — BP 132/64 | HR 69 | Ht 63.0 in | Wt 192.8 lb

## 2017-08-02 DIAGNOSIS — J439 Emphysema, unspecified: Secondary | ICD-10-CM

## 2017-08-02 DIAGNOSIS — K219 Gastro-esophageal reflux disease without esophagitis: Secondary | ICD-10-CM

## 2017-08-02 DIAGNOSIS — I255 Ischemic cardiomyopathy: Secondary | ICD-10-CM

## 2017-08-02 DIAGNOSIS — J181 Lobar pneumonia, unspecified organism: Secondary | ICD-10-CM

## 2017-08-02 DIAGNOSIS — J189 Pneumonia, unspecified organism: Secondary | ICD-10-CM

## 2017-08-02 MED ORDER — BUDESONIDE-FORMOTEROL FUMARATE 160-4.5 MCG/ACT IN AERO: 2.0000 | INHALATION_SPRAY | Freq: Two times a day (BID) | RESPIRATORY_TRACT | 0 refills | Status: DC

## 2017-08-02 NOTE — Progress Notes (Signed)
Subjective:    Patient ID: [name]., male    DOB: 03/26/1941, 76 y.o.   MRN: [id number]  HPI 76 year old former smoker (29 pack years), with a history of coronary disease, diastolic CHF, diabetes. He has undergone lap band surgery. He was followed by Dr Gwenette Greet for COPD. Also with a history of chronic rhinitis, GERD. Course has been characterized by recurrent episodes of bronchitis and right lower lobe pneumonia. I treated him for this in April 2018. He denies any overt aspiration symptoms but he is suspicious that he may have some LPR and possible aspiration, related to the lap-band. A repeat chest x-ray 04/29/17 showed that his right lower lobe infiltrate had cleared. Current bronchodilators are Spiriva, Symbicort, albuterol as needed. He has been using Zyrtec, Astelin nasal spray both as needed. He has been participate in and pulmonary rehabilitation. Cough much improved.  rec Please continue to be careful with your swallowing and your risk for possible reflux and aspiration.  Continue your Spiriva and Symbicort as you are taking them.  Take albuterol 2 puffs up to every 4 hours if needed for shortness of breath.  Please continue your zyrtec and astelin nasal spray as you are taking them .  Get the flu shot when it is available in the fall Follow with Dr Lamonte Sakai in late September 2018    07/31/17  cxr by cards c/w pna RUL > rx doxy    08/02/2017 acute extended  ov/Raylon Lamson re: recurrent pna ? Asp mech  Chief Complaint  Patient presents with  . Acute Visit    Pt stated that he was diagnosed with PNA 07/31/17 again. C/O SOB, cough, and CP. Pt went to see cardiologist thinking it was heart related but determined he had PNA.  every day when swallow x months  Food comes back up p eats w/in few min esp solids Then acutely worse x one week prior to OV  With sob, sweats, worse leg swelling. No rigors or classically pleuritic cp Newman loosened   lap band one day prior to OV   10 to 7.5 and  swallowing much better, much less regurgitation  Never had fever, cough some better on day of ov p 2 d doxy min mucoid sputum was described prev as brown.  Takes protonix ac daily  Has 02 for noct use  Does not have rescue on hand but  last used a few days prior to [place]  / doe = MMRC3 = can't walk 100 yards even at a slow pace at a flat grade s stopping due to sob    No obvious patterns in day to day or daytime variability or assoc mucus plugs or hemoptysis   or chest tightness, subjective wheeze or overt sinus or hb symptoms. No unusual exp hx or h/o childhood pna/ asthma or knowledge of premature birth.  Sleeping ok without nocturnal  or early am exacerbation  of respiratory  c/o's or need for noct saba. Also denies any obvious fluctuation of symptoms with weather or environmental changes or other aggravating or alleviating factors except as outlined above   Current Medications, Allergies, Complete Past Medical History, Past Surgical History, Family History, and Social History were reviewed in [employer] record.  ROS  The following are not active complaints unless bolded sore throat, dysphagia, dental problems, itching, sneezing,  nasal congestion or excess/ purulent secretions, ear ache,   fever, chills, sweats, unintended wt loss, classically pleuritic or exertional cp,  orthopnea pnd or leg  swelling, presyncope, palpitations, abdominal pain, anorexia, nausea, vomiting, diarrhea  or change in bowel or bladder habits, change in stools or urine, dysuria,hematuria,  rash, arthralgias, visual complaints, headache, numbness, weakness or ataxia or problems with walking or coordination,  change in mood/affect or memory.                          Objective:   Physical Exam   Wt Readings from Last 3 Encounters:  08/02/17 192 lb 12.8 oz (87.5 kg)  07/31/17 196 lb 3.2 oz (89 kg)  07/16/17 193 lb (87.5 kg)    Vital signs reviewed  - Note on arrival 02 sats  95% on RA     Gen: Pleasant, overwt, in no distress,  normal affect/ nad   ENT: No lesions,  mouth clear,  oropharynx clear, no postnasal drip  Neck: No JVD, no stridor  Lungs: No use of accessory muscles, distant, minimal insp and exp rhonchi bilaterally   CV: regular, no M gallops, trace peripheral edema bil sym lower ext   Musculoskeletal: No deformities, no cyanosis or clubbing  Neuro: alert, non focal  Skin: Warm, no lesions or rashes     I personally reviewed images and agree with radiology impression as follows:  CXR:   07/31/17 Interval development of large irregular density in right upper lobe which may represent pneumonia, but underlying neoplasm or malignancy cannot be excluded. Followup PA and lateral chest X-ray is recommended in 3-4 weeks following trial of antibiotic therapy to ensure resolution and exclude underlying Malignancy. My impression:  Rounded area of pna post seg rul was nl in 04/29/17 cxr so not likely at all to be ca      Assessment & Plan:

## 2017-08-02 NOTE — Patient Instructions (Addendum)
Plan A = Automatic = symbcort 160 Take 2 puffs first thing in am followed by spiriva x 2 puffs  and then another 2 puffs about 12 hours later.   Work on inhaler technique:  relax and gently blow all the way out then take a nice smooth deep breath back in, triggering the inhaler at same time you start breathing in.  Hold for up to 5 seconds if you can. Blow out thru nose. Rinse and gargle with water when done     Plan B = Backup Only use your albuterol as a rescue medication to be used if you can't catch your breath by resting or doing a relaxed purse lip breathing pattern.  - The less you use it, the better it will work when you need it. - Ok to use the inhaler up to 2 puffs  every 4 hours if you must but call for appointment if use goes up over your usual need - Don't leave home without it !!  (think of it like the spare tire for your car)    Add pepcid 20mg  at bedtime until no night time or early am cough    For cough > mucinex  dm  (per bottle)   Please schedule a follow up office visit in 2 weeks, sooner if needed for follow up cxr to see [name], NP or me

## 2017-08-04 NOTE — Assessment & Plan Note (Signed)
See cxr 07/31/17 > rx doxy x 10 days by cards    In the setting of chronic dysphagia/ regurgitation related to lap band (which clinically sounds like it was corrected one day ago) this is most likely an area of asp pna and doxy may not by itself be the best choice but he's actually doing better p only 2 days rx so reasonable to complete the doxy then return here for f/u in 2weeks to see Dr Lamonte Sakai or NP for f/u and call in menantime if any worsen > change to augmentin x 10 days   Don't see concern for ca here based on rapidly this density developed / no role for fob now.

## 2017-08-04 NOTE — Assessment & Plan Note (Signed)
PFT's 2006:  FEV1 1.61 (64%), ratio 62%, +airtrapping, DLCO 62% PFT's 2013:  FEV1 0.91 (39%), ratio 66, +airtrapping, TLC 64%, DLCO 52% Ambulatory ox 01/2012:  Low sat 92% with walking. +pulmonary rehab.    08/02/2017  After extensive coaching HFA effectiveness =    75% s spacer   Technically a GOLD III but not even needing his rescue in setting of pna so reasonably controlled on symb/spiriva/prn saba   I had an extended discussion with the patient and wife reviewing all relevant studies completed to date and  lasting 25 minutes of a 40  minute acute visit pt new to me     re  severe non-specific but potentially very serious refractory respiratory symptoms of uncertain and potentially multiple  etiologies.   Each maintenance medication was reviewed in detail including most importantly the difference between maintenance and prns and under what circumstances the prns are to be triggered using an action plan format that is not reflected in the computer generated alphabetically organized AVS.    Please see AVS for specific instructions unique to this office visit that I personally wrote and verbalized to the the pt in detail and then reviewed with pt  by my nurse highlighting any changes in therapy/plan of care  recommended at today's visit.

## 2017-08-04 NOTE — Assessment & Plan Note (Signed)
rec add pepcid 20 mg at bedtime while having noct or early am cough to get 24 h acid suppression

## 2017-08-05 ENCOUNTER — Telehealth: Payer: Self-pay | Admitting: Internal Medicine

## 2017-08-05 ENCOUNTER — Encounter (HOSPITAL_COMMUNITY): Payer: Self-pay

## 2017-08-05 MED ORDER — LEVOFLOXACIN 500 MG PO TABS: 500.0000 mg | ORAL_TABLET | Freq: Every day | ORAL | 0 refills | Status: DC

## 2017-08-05 NOTE — Telephone Encounter (Signed)
Spoke with pt. He is aware of RB's recommendation. Rx has been sent in. Nothing further was needed.

## 2017-08-05 NOTE — Telephone Encounter (Signed)
Called and spoke to pt. Pt states he has one day left of Doxy before finishing it. Pt was diagnosed with pna on 07/31/2017 after CXR from PCP. Pt was seen by pulmonary on 08/02/2017. Pt states he does not feel much improved. Pt c/o clear mucus from sinuses, prod cough with green mucus, intermittent chest tightness, general discomfort, and an oral temperature of 99.2. Pt requesting more abx.   Dr. Lamonte Sakai please advise. Thanks.

## 2017-08-05 NOTE — Telephone Encounter (Signed)
Good alternative would be levaquin 500mg  once a day x 7 days. If persists then it is possible that this is not bacterial, would need to be seen again at an [facility] to troubleshoot.

## 2017-08-05 NOTE — Telephone Encounter (Signed)
error 

## 2017-08-07 ENCOUNTER — Encounter (HOSPITAL_COMMUNITY): Payer: Self-pay

## 2017-08-09 ENCOUNTER — Encounter (HOSPITAL_COMMUNITY): Payer: Self-pay

## 2017-08-12 ENCOUNTER — Encounter (HOSPITAL_COMMUNITY): Payer: Self-pay

## 2017-08-13 ENCOUNTER — Telehealth: Payer: Self-pay | Admitting: Endocrinology

## 2017-08-13 NOTE — Telephone Encounter (Signed)
10:15 am tomorrow

## 2017-08-13 NOTE — Telephone Encounter (Signed)
Patient has swollen painful feet. No appt's soon enough, please advise.  Ty,  -LL

## 2017-08-13 NOTE — Telephone Encounter (Signed)
Please see below. Anything we can do? Thank you!

## 2017-08-14 ENCOUNTER — Telehealth: Payer: Self-pay

## 2017-08-14 ENCOUNTER — Encounter (HOSPITAL_COMMUNITY): Payer: Self-pay

## 2017-08-14 ENCOUNTER — Ambulatory Visit
Admission: RE | Admit: 2017-08-14 | Discharge: 2017-08-14 | Disposition: A | Discharge status: Home / Self Care | Payer: Medicare Other | Source: Ambulatory Visit | Attending: Endocrinology | Admitting: Endocrinology

## 2017-08-14 ENCOUNTER — Ambulatory Visit (INDEPENDENT_AMBULATORY_CARE_PROVIDER_SITE_OTHER): Payer: Medicare Other | Admitting: Endocrinology

## 2017-08-14 ENCOUNTER — Encounter: Payer: Self-pay | Admitting: Endocrinology

## 2017-08-14 VITALS — BP 116/66 | HR 70 | Wt 193.2 lb

## 2017-08-14 DIAGNOSIS — J189 Pneumonia, unspecified organism: Secondary | ICD-10-CM

## 2017-08-14 DIAGNOSIS — I255 Ischemic cardiomyopathy: Secondary | ICD-10-CM

## 2017-08-14 DIAGNOSIS — R0602 Shortness of breath: Secondary | ICD-10-CM | POA: Diagnosis not present

## 2017-08-14 DIAGNOSIS — N183 Chronic kidney disease, stage 3 unspecified: Secondary | ICD-10-CM

## 2017-08-14 DIAGNOSIS — E1122 Type 2 diabetes mellitus with diabetic chronic kidney disease: Secondary | ICD-10-CM

## 2017-08-14 DIAGNOSIS — R918 Other nonspecific abnormal finding of lung field: Secondary | ICD-10-CM | POA: Insufficient documentation

## 2017-08-14 DIAGNOSIS — Z794 Long term (current) use of insulin: Secondary | ICD-10-CM

## 2017-08-14 DIAGNOSIS — I5032 Chronic diastolic (congestive) heart failure: Secondary | ICD-10-CM | POA: Diagnosis not present

## 2017-08-14 LAB — BASIC METABOLIC PANEL
BUN: 28 mg/dL — AB (ref 6–23)
CALCIUM: 9.2 mg/dL (ref 8.4–10.5)
CO2: 35 meq/L — AB (ref 19–32)
CREATININE: 1.29 mg/dL (ref 0.40–1.50)
Chloride: 96 mEq/L (ref 96–112)
GFR: 57.67 mL/min — ABNORMAL LOW (ref >60.00)
GLUCOSE: 161 mg/dL — AB (ref 70–99)
Potassium: 3 mEq/L — ABNORMAL LOW (ref 3.5–5.1)
Sodium: 137 mEq/L (ref 135–145)

## 2017-08-14 LAB — BRAIN NATRIURETIC PEPTIDE: PRO B NATRI PEPTIDE: 234 pg/mL — AB (ref 0.0–100.0)

## 2017-08-14 LAB — POCT GLYCOSYLATED HEMOGLOBIN (HGB A1C): Hemoglobin A1C: 7.1

## 2017-08-14 MED ORDER — POTASSIUM CHLORIDE ER 10 MEQ PO TBCR: 10.0000 meq | EXTENDED_RELEASE_TABLET | Freq: Every day | ORAL | 11 refills | Status: DC

## 2017-08-14 MED ORDER — HYDROCODONE-ACETAMINOPHEN 5-325 MG PO TABS: 1.0000 | ORAL_TABLET | Freq: Four times a day (QID) | ORAL | 0 refills | Status: DC | PRN

## 2017-08-14 NOTE — Patient Instructions (Addendum)
blood tests are requested for you today.  We'll let you know about the results.   If we can, we'll increase the fluid pill. Please continue the same insulin.  check your blood sugar twice a day.  vary the time of day when you check, between before the 3 meals, and at bedtime.  also check if you have symptoms of your blood sugar being too high or too low.  please keep a record of the readings and bring it to your next appointment here (or you can bring the meter itself).  You can write it on any piece of paper.  please call us sooner if your blood sugar goes below 70, or if you have a lot of readings over 200. Please come back for a follow-up appointment in 2 weeks.

## 2017-08-14 NOTE — Telephone Encounter (Signed)
Spoke with nurse at [facility] and we went over results of chest xray of patient- recommendations are chest CT scan- all results are available for review in EPIC
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
## 2017-08-16 ENCOUNTER — Encounter (HOSPITAL_COMMUNITY): Payer: Self-pay

## 2017-08-19 ENCOUNTER — Encounter (HOSPITAL_COMMUNITY): Payer: Self-pay

## 2017-08-19 ENCOUNTER — Encounter: Payer: Self-pay | Admitting: Endocrinology

## 2017-08-20 ENCOUNTER — Ambulatory Visit (INDEPENDENT_AMBULATORY_CARE_PROVIDER_SITE_OTHER): Payer: Medicare Other | Admitting: Physician Assistant

## 2017-08-20 ENCOUNTER — Encounter: Payer: Self-pay | Admitting: Physician Assistant

## 2017-08-20 ENCOUNTER — Ambulatory Visit (HOSPITAL_COMMUNITY)
Admission: RE | Admit: 2017-08-20 | Discharge: 2017-08-20 | Disposition: A | Discharge status: Home / Self Care | Payer: Medicare Other | Source: Ambulatory Visit | Attending: Physician Assistant | Admitting: Physician Assistant

## 2017-08-20 VITALS — BP 118/52 | HR 62 | Ht 63.0 in | Wt 191.0 lb

## 2017-08-20 DIAGNOSIS — I10 Essential (primary) hypertension: Secondary | ICD-10-CM

## 2017-08-20 DIAGNOSIS — N183 Chronic kidney disease, stage 3 unspecified: Secondary | ICD-10-CM

## 2017-08-20 DIAGNOSIS — R600 Localized edema: Secondary | ICD-10-CM | POA: Diagnosis not present

## 2017-08-20 DIAGNOSIS — I2510 Atherosclerotic heart disease of native coronary artery without angina pectoris: Secondary | ICD-10-CM

## 2017-08-20 DIAGNOSIS — E876 Hypokalemia: Secondary | ICD-10-CM

## 2017-08-20 DIAGNOSIS — I5043 Acute on chronic combined systolic (congestive) and diastolic (congestive) heart failure: Secondary | ICD-10-CM | POA: Diagnosis not present

## 2017-08-20 DIAGNOSIS — I255 Ischemic cardiomyopathy: Secondary | ICD-10-CM

## 2017-08-20 MED ORDER — CARVEDILOL 25 MG PO TABS: 25.0000 mg | ORAL_TABLET | Freq: Two times a day (BID) | ORAL | 3 refills | Status: DC

## 2017-08-20 NOTE — Patient Instructions (Addendum)
Medication Instructions:  1.  STOP Diltiazem 2.  START Coreg 25 mg taking 1 tablet by mouth twice a daily  Labwork: TODAY: BMET, PRO BNP, & MAGNESIUM  Testing/Procedures: Your physician has requested that you have a lower or upper extremity venous duplex. This test is an ultrasound of the veins in the legs or arms. It looks at venous blood flow that carries blood from the heart to the legs or arms. Allow one hour for a Lower Venous exam. Allow thirty minutes for an Upper Venous exam. There are no restrictions or special instructions.     Follow-Up: Your physician recommends that you schedule a follow-up appointment in: 09/11/17 ARRIVE AT 7:45 TO SEE DAYNA DUNN, PA-C   Any Other Special Instructions Will Be Listed Below (If Applicable).  Vascular Ultrasound An ultrasound, also called sonography or ultrasonography, uses harmless sound waves to take pictures of the inside of your body. The pictures are taken with a device called a transducer that is held up against your body. The continually changing pictures can be recorded on videotape or film. A vascular ultrasound is a painless test to see if you have blood flow problems or clots in your blood vessels. It may be done to look at blood vessels almost anywhere in the body. There are several types of ultrasounds that can be done to look at the blood vessels. They include:  Continuous wave Doppler ultrasound. This type of ultrasound uses the change in pitch of sound waves to provide information about blood flow through a blood vessel. During the test, a health care provider listens to the sounds produced by the transducer.  Duplex ultrasound. This type of ultrasound uses standard ultrasound methods to produce a picture of a blood vessel and surrounding organs. In addition, a computer provides information about the speed and direction of blood flow through the blood vessel. With this type of ultrasound it is possible to see the structures inside  the body and to evaluate blood flow within those structures at the same time.  Color Doppler ultrasound. This type of ultrasound uses standard ultrasound methods to produce a picture of a blood vessel. In addition, a computer converts the Doppler sounds into colors that are overlaid on the picture of the blood vessel. These colors represent the speed and direction of blood flow through the vessel.  Power Doppler ultrasound. This type of ultrasound is up to five times more sensitive than color Doppler ultrasound. Power Doppler ultrasound can also get pictures that are difficult or impossible to get using standard color Doppler ultrasound. Power Doppler ultrasound is most commonly used to evaluate blood flow through vessels within organs, such as the liver or kidneys.  Transcranial Doppler ultrasound. This type of ultrasound looks at blood flow in blood vessels throughout the brain. It can reveal the presence of narrow arteries, clots blocking the vessels, or malformed blood vessels.  What are the risks? There are no known risks or complications of having an ultrasound. What happens before the procedure?  If the ultrasound scan involves your upper abdomen, you may be directed not to eat, smoke, or chew gum the morning of your exam. Follow your health care provider's instructions.  During the test, a gel will be applied to your skin. Wear clothing that is easily washable in case the gel gets on your clothes. What happens during the procedure?  A gel will be applied to your skin. It may feel cool.  The transducer will be placed on the area to  be examined.  Pictures will be taken. They will be displayed on one or more monitors that look like small television screens. What happens after the procedure?  You can safely drive home and return to regular activities immediately after your exam.  Keep follow-up visits as directed by your health care provider.  Ask when your test results will be ready.  It is your responsibility to get your test results. This information is not intended to replace advice given to you by your health care provider. Make sure you discuss any questions you have with your health care provider. Document Released: 12/21/2004 Document Revised: 05/17/2016 Document Reviewed: 03/04/2014 Elsevier Interactive Patient Education  [employer].   If you need a refill on your cardiac medications before your next appointment, please call your pharmacy.

## 2017-08-20 NOTE — Progress Notes (Signed)
*  PRELIMINARY RESULTS* Vascular Ultrasound Bilateral lower extremity venous duplex has been completed.  Preliminary findings: No evidence of deep vein thrombosis or baker's cysts bilaterally.  Preliminary results called to [facility] @ 16:35   Everrett Coombe 08/20/2017, 4:46 PM
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
## 2017-08-21 ENCOUNTER — Encounter (HOSPITAL_COMMUNITY): Payer: Self-pay

## 2017-08-21 ENCOUNTER — Encounter: Payer: Self-pay | Admitting: Acute Care

## 2017-08-21 ENCOUNTER — Ambulatory Visit (INDEPENDENT_AMBULATORY_CARE_PROVIDER_SITE_OTHER)
Admission: RE | Admit: 2017-08-21 | Discharge: 2017-08-21 | Disposition: A | Discharge status: Home / Self Care | Payer: Medicare Other | Source: Ambulatory Visit | Attending: Acute Care | Admitting: Acute Care

## 2017-08-21 ENCOUNTER — Ambulatory Visit (INDEPENDENT_AMBULATORY_CARE_PROVIDER_SITE_OTHER): Payer: Medicare Other | Admitting: Acute Care

## 2017-08-21 VITALS — BP 122/60 | HR 98 | Ht 63.0 in | Wt 190.4 lb

## 2017-08-21 DIAGNOSIS — J439 Emphysema, unspecified: Secondary | ICD-10-CM | POA: Diagnosis not present

## 2017-08-21 DIAGNOSIS — J189 Pneumonia, unspecified organism: Secondary | ICD-10-CM | POA: Diagnosis not present

## 2017-08-21 DIAGNOSIS — R0602 Shortness of breath: Secondary | ICD-10-CM | POA: Diagnosis not present

## 2017-08-21 DIAGNOSIS — I255 Ischemic cardiomyopathy: Secondary | ICD-10-CM

## 2017-08-21 LAB — BASIC METABOLIC PANEL
BUN/Creatinine Ratio: 24 (ref 10–24)
BUN: 32 mg/dL — ABNORMAL HIGH (ref 8–27)
CALCIUM: 9.2 mg/dL (ref 8.6–10.2)
CO2: 29 mmol/L (ref 20–29)
Chloride: 93 mmol/L — ABNORMAL LOW (ref 96–106)
Creatinine, Ser: 1.34 mg/dL — ABNORMAL HIGH (ref 0.76–1.27)
GFR calc Af Amer: 59 mL/min/{1.73_m2} — ABNORMAL LOW (ref >59)
GFR calc non Af Amer: 51 mL/min/{1.73_m2} — ABNORMAL LOW (ref >59)
GLUCOSE: 103 mg/dL — AB (ref 65–99)
POTASSIUM: 4.1 mmol/L (ref 3.5–5.2)
SODIUM: 138 mmol/L (ref 134–144)

## 2017-08-21 LAB — PRO B NATRIURETIC PEPTIDE: NT-PRO BNP: 1174 pg/mL — AB (ref 0–486)

## 2017-08-21 LAB — MAGNESIUM: Magnesium: 2.2 mg/dL (ref 1.6–2.3)
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
## 2017-08-21 NOTE — Assessment & Plan Note (Signed)
CT Chest per Dr. Loanne Drilling ( Ordered but not scheduled) Continue Symbicort and Spiriva  As you have been doing. Follow up appointment with Dr. Lamonte Sakai in 3 months. Please contact office for sooner follow up if symptoms do not improve or worsen or seek emergency care

## 2017-08-21 NOTE — Assessment & Plan Note (Addendum)
07/31/17  cxr by cards c/w pna RUL > rx doxy 8/18>> Levaquin 500 mg once daily x 7 days Clinically better, but chest x-ray 08/20/17 indicates interval development of large irregular density in the right upper lobe Chest x-ray today pending Plan We will call you with the results of your CXR. CT Chest per Dr. Loanne Drilling ( Ordered but not scheduled) Concern for possibility of malignant neoplasm secondary to heavy smoking history Continue Pepcid and Omeprazole as you have been doing. Consider swallow evaluation if further imaging is inconclusive Follow up appointment with Dr. Lamonte Sakai in 3 months. Please contact office for sooner follow up if symptoms do not improve or worsen or seek emergency care

## 2017-08-21 NOTE — Patient Instructions (Addendum)
It is good to meet you today. We will call you with the results of your CXR. CT Chest per Dr. Loanne Drilling ( Ordered but not scheduled) Continue Symbicort and Spiriva  As you have been doing. Continue Pepcid and Omeprazole as you have been doing. Follow up appointment with Dr. Lamonte Sakai in 3 months. Please contact office for sooner follow up if symptoms do not improve or worsen or seek emergency care

## 2017-08-22 ENCOUNTER — Telehealth: Payer: Self-pay | Admitting: *Deleted

## 2017-08-22 NOTE — Telephone Encounter (Signed)
error 

## 2017-08-22 NOTE — Telephone Encounter (Signed)
-----   Message from Charlie Pitter, [place] sent at 08/22/2017  4:16 PM EDT ----- Please let patient know BNP is much less than 3 weeks ago. It is still elevated but sometimes difficult to interpret when there is kidney dysfunction present. Since edema has recently been improving, let's see how he does with the recent switch from diltiazem to increased dose of carvedilol. Can you clarify what dose of Lasix he has been taking at home? At our recent office visit it sounds like he was on 80mg  QAM/60mg  QPM but the MAR says 120mg  BID. I would continue taking whatever he was currently on. Please have him call within 1 week if he does not notice any continued improvement, or notices any worsening. F/u as planned. Dayna Dunn PA-C

## 2017-08-23 ENCOUNTER — Encounter (HOSPITAL_COMMUNITY): Payer: Self-pay

## 2017-08-28 ENCOUNTER — Ambulatory Visit (INDEPENDENT_AMBULATORY_CARE_PROVIDER_SITE_OTHER): Payer: Medicare Other | Admitting: Endocrinology

## 2017-08-28 ENCOUNTER — Encounter (HOSPITAL_COMMUNITY): Payer: Self-pay

## 2017-08-28 ENCOUNTER — Encounter: Payer: Self-pay | Admitting: Endocrinology

## 2017-08-28 ENCOUNTER — Telehealth: Payer: Self-pay | Admitting: Endocrinology

## 2017-08-28 DIAGNOSIS — I509 Heart failure, unspecified: Secondary | ICD-10-CM | POA: Insufficient documentation

## 2017-08-28 DIAGNOSIS — N183 Chronic kidney disease, stage 3 unspecified: Secondary | ICD-10-CM

## 2017-08-28 DIAGNOSIS — I255 Ischemic cardiomyopathy: Secondary | ICD-10-CM

## 2017-08-28 MED ORDER — FUROSEMIDE 20 MG PO TABS: 60.0000 mg | ORAL_TABLET | Freq: Two times a day (BID) | ORAL | 11 refills | Status: DC

## 2017-08-28 NOTE — Telephone Encounter (Signed)
please call [place]: What is progress of CT request?

## 2017-08-28 NOTE — Telephone Encounter (Signed)
Called & left Nonah Mattes a detialed VM.

## 2017-08-28 NOTE — Patient Instructions (Signed)
Please continue the same medications.   Please come back for a follow-up appointment in 2 months.  
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
## 2017-08-30 ENCOUNTER — Encounter (HOSPITAL_COMMUNITY)
Admission: RE | Admit: 2017-08-30 | Discharge: 2017-08-30 | Disposition: A | Discharge status: Home / Self Care | Payer: Self-pay | Source: Ambulatory Visit | Attending: Cardiovascular Disease | Admitting: Cardiovascular Disease

## 2017-08-30 ENCOUNTER — Other Ambulatory Visit: Payer: Self-pay | Admitting: Endocrinology

## 2017-08-30 DIAGNOSIS — N19 Unspecified kidney failure: Secondary | ICD-10-CM | POA: Insufficient documentation

## 2017-09-02 ENCOUNTER — Encounter (HOSPITAL_COMMUNITY): Payer: Self-pay

## 2017-09-02 ENCOUNTER — Other Ambulatory Visit: Payer: Medicare Other

## 2017-09-04 ENCOUNTER — Encounter (HOSPITAL_COMMUNITY)
Admission: RE | Admit: 2017-09-04 | Discharge: 2017-09-04 | Disposition: A | Discharge status: Home / Self Care | Payer: Self-pay | Source: Ambulatory Visit | Attending: Cardiovascular Disease | Admitting: Cardiovascular Disease

## 2017-09-05 ENCOUNTER — Other Ambulatory Visit: Payer: Self-pay | Admitting: Cardiovascular Disease

## 2017-09-05 ENCOUNTER — Other Ambulatory Visit: Payer: Self-pay | Admitting: Endocrinology

## 2017-09-05 ENCOUNTER — Ambulatory Visit (INDEPENDENT_AMBULATORY_CARE_PROVIDER_SITE_OTHER)
Admission: RE | Admit: 2017-09-05 | Discharge: 2017-09-05 | Disposition: A | Discharge status: Home / Self Care | Payer: Medicare Other | Source: Ambulatory Visit | Attending: Endocrinology | Admitting: Endocrinology

## 2017-09-05 DIAGNOSIS — J189 Pneumonia, unspecified organism: Secondary | ICD-10-CM | POA: Diagnosis not present

## 2017-09-05 DIAGNOSIS — R918 Other nonspecific abnormal finding of lung field: Secondary | ICD-10-CM | POA: Diagnosis not present

## 2017-09-05 MED ORDER — IOPAMIDOL (ISOVUE-300) INJECTION 61%
80.0000 mL | Freq: Once | INTRAVENOUS | Status: AC | PRN
  Administered 2017-09-05: 80 mL via INTRAVENOUS

## 2017-09-06 ENCOUNTER — Encounter (HOSPITAL_COMMUNITY)
Admission: RE | Admit: 2017-09-06 | Discharge: 2017-09-06 | Disposition: A | Discharge status: Home / Self Care | Payer: Self-pay | Source: Ambulatory Visit | Attending: Cardiovascular Disease | Admitting: Cardiovascular Disease

## 2017-09-09 ENCOUNTER — Encounter (HOSPITAL_COMMUNITY): Payer: Self-pay
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
## 2017-09-11 ENCOUNTER — Encounter (HOSPITAL_COMMUNITY)
Admission: RE | Admit: 2017-09-11 | Discharge: 2017-09-11 | Disposition: A | Discharge status: Home / Self Care | Payer: Self-pay | Source: Ambulatory Visit | Attending: Cardiovascular Disease | Admitting: Cardiovascular Disease

## 2017-09-11 ENCOUNTER — Other Ambulatory Visit: Payer: Self-pay | Admitting: Endocrinology

## 2017-09-11 ENCOUNTER — Ambulatory Visit (INDEPENDENT_AMBULATORY_CARE_PROVIDER_SITE_OTHER): Payer: Medicare Other | Admitting: Physician Assistant

## 2017-09-11 ENCOUNTER — Encounter: Payer: Self-pay | Admitting: Physician Assistant

## 2017-09-11 VITALS — BP 138/54 | HR 68 | Ht 63.0 in | Wt 198.4 lb

## 2017-09-11 DIAGNOSIS — R600 Localized edema: Secondary | ICD-10-CM | POA: Diagnosis not present

## 2017-09-11 DIAGNOSIS — I10 Essential (primary) hypertension: Secondary | ICD-10-CM

## 2017-09-11 DIAGNOSIS — I255 Ischemic cardiomyopathy: Secondary | ICD-10-CM

## 2017-09-11 DIAGNOSIS — I2510 Atherosclerotic heart disease of native coronary artery without angina pectoris: Secondary | ICD-10-CM

## 2017-09-11 DIAGNOSIS — I5043 Acute on chronic combined systolic (congestive) and diastolic (congestive) heart failure: Secondary | ICD-10-CM | POA: Diagnosis not present

## 2017-09-11 NOTE — Patient Instructions (Addendum)
Medication Instructions:  Your physician recommends that you continue on your current medications as directed. Please refer to the Current Medication list given to you today.  Labwork: None ordered  Testing/Procedures: None ordered3  Follow-Up: Your physician recommends that you schedule a follow-up appointment in: 3 MONTHS WITH DR. Angelena Form  Any Other Special Instructions Will Be Listed Below (If Applicable).     If you need a refill on your cardiac medications before your next appointment, please call your pharmacy.

## 2017-09-13 ENCOUNTER — Encounter (HOSPITAL_COMMUNITY): Payer: Self-pay

## 2017-09-16 ENCOUNTER — Ambulatory Visit: Payer: Medicare Other | Admitting: Emergency Medicine

## 2017-09-16 ENCOUNTER — Encounter (HOSPITAL_COMMUNITY)
Admission: RE | Admit: 2017-09-16 | Discharge: 2017-09-16 | Disposition: A | Discharge status: Home / Self Care | Payer: Self-pay | Source: Ambulatory Visit | Attending: Cardiovascular Disease | Admitting: Cardiovascular Disease

## 2017-09-17 ENCOUNTER — Other Ambulatory Visit: Payer: Self-pay | Admitting: Endocrinology

## 2017-09-18 ENCOUNTER — Encounter (HOSPITAL_COMMUNITY)
Admission: RE | Admit: 2017-09-18 | Discharge: 2017-09-18 | Disposition: A | Discharge status: Home / Self Care | Payer: Self-pay | Source: Ambulatory Visit | Attending: Cardiovascular Disease | Admitting: Cardiovascular Disease

## 2017-09-18 DIAGNOSIS — S20469A Insect bite (nonvenomous) of unspecified back wall of thorax, initial encounter: Secondary | ICD-10-CM | POA: Diagnosis not present

## 2017-09-18 DIAGNOSIS — Z23 Encounter for immunization: Secondary | ICD-10-CM | POA: Diagnosis not present

## 2017-09-18 DIAGNOSIS — W57XXXA Bitten or stung by nonvenomous insect and other nonvenomous arthropods, initial encounter: Secondary | ICD-10-CM | POA: Diagnosis not present

## 2017-09-18 DIAGNOSIS — L821 Other seborrheic keratosis: Secondary | ICD-10-CM | POA: Diagnosis not present

## 2017-09-18 DIAGNOSIS — D2230 Melanocytic nevi of unspecified part of face: Secondary | ICD-10-CM | POA: Diagnosis not present

## 2017-09-18 DIAGNOSIS — L570 Actinic keratosis: Secondary | ICD-10-CM | POA: Diagnosis not present

## 2017-09-18 DIAGNOSIS — Z85828 Personal history of other malignant neoplasm of skin: Secondary | ICD-10-CM | POA: Diagnosis not present

## 2017-09-18 DIAGNOSIS — Z86018 Personal history of other benign neoplasm: Secondary | ICD-10-CM | POA: Diagnosis not present

## 2017-09-18 DIAGNOSIS — D225 Melanocytic nevi of trunk: Secondary | ICD-10-CM | POA: Diagnosis not present

## 2017-09-20 ENCOUNTER — Encounter (HOSPITAL_COMMUNITY)
Admission: RE | Admit: 2017-09-20 | Discharge: 2017-09-20 | Disposition: A | Discharge status: Home / Self Care | Payer: Self-pay | Source: Ambulatory Visit | Attending: Cardiovascular Disease | Admitting: Cardiovascular Disease

## 2017-09-21 ENCOUNTER — Other Ambulatory Visit: Payer: Self-pay | Admitting: Endocrinology

## 2017-09-21 ENCOUNTER — Other Ambulatory Visit: Payer: Self-pay | Admitting: Emergency Medicine

## 2017-09-23 ENCOUNTER — Encounter (HOSPITAL_COMMUNITY)
Admission: RE | Admit: 2017-09-23 | Discharge: 2017-09-23 | Disposition: A | Discharge status: Home / Self Care | Payer: Medicare Other | Source: Ambulatory Visit | Attending: Cardiovascular Disease | Admitting: Cardiovascular Disease

## 2017-09-23 DIAGNOSIS — I509 Heart failure, unspecified: Secondary | ICD-10-CM | POA: Insufficient documentation

## 2017-09-24 ENCOUNTER — Telehealth: Payer: Self-pay | Admitting: Endocrinology

## 2017-09-24 ENCOUNTER — Other Ambulatory Visit: Payer: Self-pay

## 2017-09-24 MED ORDER — HYDROCODONE-ACETAMINOPHEN 5-325 MG PO TABS: 1.0000 | ORAL_TABLET | Freq: Four times a day (QID) | ORAL | 0 refills | Status: DC | PRN

## 2017-09-24 NOTE — Telephone Encounter (Signed)
I printed  

## 2017-09-24 NOTE — Telephone Encounter (Signed)
MEDICATION: HYDROcodone-acetaminophen (NORCO/VICODIN) 5-325 MG tablet  PHARMACY:  [facility]

## 2017-09-25 ENCOUNTER — Encounter (HOSPITAL_COMMUNITY)
Admission: RE | Admit: 2017-09-25 | Discharge: 2017-09-25 | Disposition: A | Discharge status: Home / Self Care | Payer: Medicare Other | Source: Ambulatory Visit | Attending: Cardiovascular Disease | Admitting: Cardiovascular Disease

## 2017-09-25 NOTE — Telephone Encounter (Signed)
Called and spoke with patient's wife notifying her that her husband's prescription was ready to pick up at front desk.

## 2017-09-27 ENCOUNTER — Encounter (HOSPITAL_COMMUNITY)
Admission: RE | Admit: 2017-09-27 | Discharge: 2017-09-27 | Disposition: A | Discharge status: Home / Self Care | Payer: Medicare Other | Source: Ambulatory Visit | Attending: Cardiovascular Disease | Admitting: Cardiovascular Disease

## 2017-09-30 ENCOUNTER — Encounter (HOSPITAL_COMMUNITY)
Admission: RE | Admit: 2017-09-30 | Discharge: 2017-09-30 | Disposition: A | Discharge status: Home / Self Care | Payer: Medicare Other | Source: Ambulatory Visit | Attending: Cardiovascular Disease | Admitting: Cardiovascular Disease

## 2017-09-30 DIAGNOSIS — I509 Heart failure, unspecified: Secondary | ICD-10-CM | POA: Diagnosis not present

## 2017-09-30 LAB — GLUCOSE, CAPILLARY: Glucose-Capillary: 187 mg/dL — ABNORMAL HIGH (ref 65–99)

## 2017-10-02 ENCOUNTER — Ambulatory Visit (INDEPENDENT_AMBULATORY_CARE_PROVIDER_SITE_OTHER): Payer: Medicare Other

## 2017-10-02 ENCOUNTER — Encounter (HOSPITAL_COMMUNITY): Payer: Medicare Other

## 2017-10-02 DIAGNOSIS — Z23 Encounter for immunization: Secondary | ICD-10-CM

## 2017-10-04 ENCOUNTER — Encounter (HOSPITAL_COMMUNITY)
Admission: RE | Admit: 2017-10-04 | Discharge: 2017-10-04 | Disposition: A | Discharge status: Home / Self Care | Payer: Medicare Other | Source: Ambulatory Visit | Attending: Cardiovascular Disease | Admitting: Cardiovascular Disease

## 2017-10-07 ENCOUNTER — Encounter (HOSPITAL_COMMUNITY)
Admission: RE | Admit: 2017-10-07 | Discharge: 2017-10-07 | Disposition: A | Discharge status: Home / Self Care | Payer: Medicare Other | Source: Ambulatory Visit | Attending: Cardiovascular Disease | Admitting: Cardiovascular Disease

## 2017-10-09 ENCOUNTER — Encounter (HOSPITAL_COMMUNITY): Payer: Medicare Other

## 2017-10-11 ENCOUNTER — Encounter (HOSPITAL_COMMUNITY): Payer: Medicare Other

## 2017-10-14 ENCOUNTER — Encounter (HOSPITAL_COMMUNITY)
Admission: RE | Admit: 2017-10-14 | Discharge: 2017-10-14 | Disposition: A | Discharge status: Home / Self Care | Payer: Medicare Other | Source: Ambulatory Visit | Attending: Cardiovascular Disease | Admitting: Cardiovascular Disease

## 2017-10-16 ENCOUNTER — Encounter (HOSPITAL_COMMUNITY)
Admission: RE | Admit: 2017-10-16 | Discharge: 2017-10-16 | Disposition: A | Discharge status: Home / Self Care | Payer: Medicare Other | Source: Ambulatory Visit | Attending: Cardiovascular Disease | Admitting: Cardiovascular Disease

## 2017-10-18 ENCOUNTER — Encounter (HOSPITAL_COMMUNITY): Payer: Medicare Other

## 2017-10-21 ENCOUNTER — Encounter (HOSPITAL_COMMUNITY): Payer: Medicare Other

## 2017-10-23 ENCOUNTER — Encounter (HOSPITAL_COMMUNITY)
Admission: RE | Admit: 2017-10-23 | Discharge: 2017-10-23 | Disposition: A | Discharge status: Home / Self Care | Payer: Medicare Other | Source: Ambulatory Visit | Attending: Cardiovascular Disease | Admitting: Cardiovascular Disease

## 2017-10-24 ENCOUNTER — Telehealth: Payer: Self-pay | Admitting: Endocrinology

## 2017-10-24 MED ORDER — HYDROCODONE-ACETAMINOPHEN 5-325 MG PO TABS: 1.0000 | ORAL_TABLET | Freq: Four times a day (QID) | ORAL | 0 refills | Status: DC | PRN

## 2017-10-24 NOTE — Telephone Encounter (Signed)
I printed  

## 2017-10-24 NOTE — Telephone Encounter (Signed)
Patient notified of message and voiced understanding.  

## 2017-10-24 NOTE — Telephone Encounter (Signed)
Refill of hydrocdone

## 2017-10-25 ENCOUNTER — Encounter (HOSPITAL_COMMUNITY): Payer: Self-pay

## 2017-10-28 ENCOUNTER — Encounter: Payer: Self-pay | Admitting: Endocrinology

## 2017-10-28 ENCOUNTER — Ambulatory Visit (INDEPENDENT_AMBULATORY_CARE_PROVIDER_SITE_OTHER): Payer: Medicare Other | Admitting: Endocrinology

## 2017-10-28 ENCOUNTER — Other Ambulatory Visit: Payer: Self-pay | Admitting: Cardiovascular Disease

## 2017-10-28 ENCOUNTER — Encounter (HOSPITAL_COMMUNITY): Payer: Self-pay

## 2017-10-28 VITALS — BP 146/72 | HR 72 | Wt 207.8 lb

## 2017-10-28 DIAGNOSIS — E1151 Type 2 diabetes mellitus with diabetic peripheral angiopathy without gangrene: Secondary | ICD-10-CM | POA: Diagnosis not present

## 2017-10-28 DIAGNOSIS — I255 Ischemic cardiomyopathy: Secondary | ICD-10-CM

## 2017-10-28 DIAGNOSIS — G894 Chronic pain syndrome: Secondary | ICD-10-CM | POA: Diagnosis not present

## 2017-10-28 DIAGNOSIS — Z794 Long term (current) use of insulin: Secondary | ICD-10-CM | POA: Diagnosis not present

## 2017-10-28 DIAGNOSIS — Z23 Encounter for immunization: Secondary | ICD-10-CM

## 2017-10-28 DIAGNOSIS — I509 Heart failure, unspecified: Secondary | ICD-10-CM | POA: Insufficient documentation

## 2017-10-28 DIAGNOSIS — G4700 Insomnia, unspecified: Secondary | ICD-10-CM

## 2017-10-28 DIAGNOSIS — Z0000 Encounter for general adult medical examination without abnormal findings: Secondary | ICD-10-CM

## 2017-10-28 LAB — POCT GLYCOSYLATED HEMOGLOBIN (HGB A1C): Hemoglobin A1C: 6.5

## 2017-10-28 MED ORDER — INSULIN ASPART 100 UNIT/ML ~~LOC~~ SOLN: SUBCUTANEOUS | 11 refills | Status: DC

## 2017-10-28 MED ORDER — GABAPENTIN 100 MG PO CAPS: 100.0000 mg | ORAL_CAPSULE | Freq: Two times a day (BID) | ORAL | 5 refills | Status: DC

## 2017-10-28 NOTE — Patient Instructions (Addendum)
Please reduce the breakfast insulin to 10 units.  I have sent a prescription to your pharmacy, fpor gabapentin, with the goal of reducing the need for vicodin.  This may also reduce your need for the trazodone, so please let us know if you get too drowsy. Please consider these measures for your health:  minimize alcohol.  Do not use tobacco products.  Have a colonoscopy at least every 10 years from age 75.  Women should have an annual mammogram from age 44.  Keep firearms safely stored.  Always use seat belts.  have working smoke alarms in your home.  See an eye doctor and dentist regularly.  Never drive under the influence of alcohol or drugs (including prescription drugs).  Those with fair skin should take precautions against the sun, and should carefully examine their skin once per month, for any new or changed moles. It is critically important to prevent falling down (keep floor areas well-lit, dry, and free of loose objects.  If you have a cane, walker, or wheelchair, you should use it, even for short trips around the house.  Wear flat-soled shoes.  Also, try not to rush) good diet and exercise significantly improve the control of your diabetes.  please let me know if you wish to be referred to a dietician.  high blood sugar is very risky to your health.  you should see an eye doctor and dentist every year.  It is very important to get all recommended vaccinations.  Please come back for a follow-up appointment in 3 months.

## 2017-10-28 NOTE — Progress Notes (Signed)
Subjective:    Patient ID: [name] Alcon., male    DOB: 1941/07/31, 76 y.o.   MRN: [id number]  HPI Pt returns for f/u of diabetes mellitus:  DM type: Insulin-requiring type 2 Dx'ed: 4098 Complications: renal insufficiency, retinopathy, CAD and PAD.   Therapy: insulin since 2005.   DKA: never.   Severe hypoglycemia: never.   Pancreatitis: never.  Other info: he underwent gastric band placement in June 2012 (weighed 260 prior), but needed to resume insulin soon thereafter; he takes multiple daily injections.   Interval history: no cbg record, but states cbg's are well-controlled.  It is in general lowest at lunch. He takes vicodin for bilat LE (leg/ankle/foot) pain (due to bilat fxs due to 2013 fall); he cannot take NSAID, due to renal insuff; he cannot take patch, due to location; lidocaine gel did not work.  Pain is worst at night, or with exercise.    Review of Systems He denies hypoglycemia.      Objective:   Physical Exam VITAL SIGNS:  See vs page GENERAL: no distress LUNGS:  Clear to auscultation, except for rales at the right base Pulses: foot pulses are intact bilaterally.   MSK: no deformity of the feet or ankles.  CV: 1+ bilat leg edema, and bilat leg vv's.  Skin:  no ulcer on the feet or ankles.  normal color and temp on the feet and ankles.   Neuro: sensation is intact to touch on the feet and ankles.   Lab Results  Component Value Date   HGBA1C 6.5 10/28/2017      Assessment & Plan:  Insulin-requiring type 2 DM: slightly overcontrolled.   Chronic pain syndrome: we discussed plan to minimize or d/c vicodin.   Insomnia, persistent.    Please reduce the breakfast insulin to 10 units.  I have sent a prescription to your pharmacy, for gabapentin, with the goal of reducing the need for vicodin.  This may also reduce your need for the trazodone, so please let us know if you get too drowsy  Subjective:   Patient here for Medicare annual wellness visit and  management of other chronic and acute problems.     Risk factors: advanced age    77 of Physicians Providing Medical Care to Patient:  See "snapshot"   Activities of Daily Living: In your present state of health, do you have any difficulty performing the following activities (lives with wife and disabled dtr)?: Preparing food and eating?: No  Bathing yourself: No  Getting dressed: No  Using the toilet: No  Moving around from place to place: No  In the past year have you fallen or had a near fall?: No    Home Safety: Has smoke detector and wears seat belts. No firearms. No excess sun exposure.    Opioid Use: addressed above   Diet and Exercise  Current exercise habits: as limited by health problems Dietary issues discussed: pt reports a failry healthy diet   Depression Screen  Q1: Over the past two weeks, have you felt down, depressed or hopeless?no  Q2: Over the past two weeks, have you felt little interest or pleasure in doing things? no   The following portions of the patient's history were reviewed and updated as appropriate: allergies, current medications, past family history, past medical history, past social history, past surgical history and problem list.   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  remembers 3/3 at 5 minutes.  excellent recall.  can easily read and write a sentence.  alert and oriented x 3.    Assessment:   Medicare wellness utd on preventive parameters    Plan:   During the course of the visit the patient was educated and counseled about appropriate screening and preventive services including:        Fall prevention is advised today.   Diabetes screening  is not done, as pt has DM Nutrition counseling is offered.     Vaccines are updated as needed  Patient Instructions (the written plan) was given to the patient.

## 2017-10-28 NOTE — Progress Notes (Signed)
we discussed code status.  pt requests full code, but would not want to be started or maintained on artificial life-support measures if there was not a reasonable chance of recovery 

## 2017-10-30 ENCOUNTER — Encounter (HOSPITAL_COMMUNITY)
Admission: RE | Admit: 2017-10-30 | Discharge: 2017-10-30 | Disposition: A | Discharge status: Home / Self Care | Payer: Self-pay | Source: Ambulatory Visit | Attending: Cardiovascular Disease | Admitting: Cardiovascular Disease

## 2017-11-01 ENCOUNTER — Encounter (HOSPITAL_COMMUNITY)
Admission: RE | Admit: 2017-11-01 | Discharge: 2017-11-01 | Disposition: A | Discharge status: Home / Self Care | Payer: Self-pay | Source: Ambulatory Visit | Attending: Cardiovascular Disease | Admitting: Cardiovascular Disease

## 2017-11-04 ENCOUNTER — Encounter (HOSPITAL_COMMUNITY)
Admission: RE | Admit: 2017-11-04 | Discharge: 2017-11-04 | Disposition: A | Discharge status: Home / Self Care | Payer: Self-pay | Source: Ambulatory Visit | Attending: Cardiovascular Disease | Admitting: Cardiovascular Disease

## 2017-11-05 ENCOUNTER — Other Ambulatory Visit: Payer: Self-pay | Admitting: Cardiovascular Disease

## 2017-11-06 ENCOUNTER — Encounter (HOSPITAL_COMMUNITY)
Admission: RE | Admit: 2017-11-06 | Discharge: 2017-11-06 | Disposition: A | Discharge status: Home / Self Care | Payer: Self-pay | Source: Ambulatory Visit | Attending: Cardiovascular Disease | Admitting: Cardiovascular Disease

## 2017-11-08 ENCOUNTER — Encounter (HOSPITAL_COMMUNITY)
Admission: RE | Admit: 2017-11-08 | Discharge: 2017-11-08 | Disposition: A | Discharge status: Home / Self Care | Payer: Self-pay | Source: Ambulatory Visit | Attending: Cardiovascular Disease | Admitting: Cardiovascular Disease

## 2017-11-11 ENCOUNTER — Encounter (HOSPITAL_COMMUNITY)
Admission: RE | Admit: 2017-11-11 | Discharge: 2017-11-11 | Disposition: A | Discharge status: Home / Self Care | Payer: Medicare Other | Source: Ambulatory Visit | Attending: Cardiovascular Disease | Admitting: Cardiovascular Disease

## 2017-11-13 ENCOUNTER — Encounter (HOSPITAL_COMMUNITY)
Admission: RE | Admit: 2017-11-13 | Discharge: 2017-11-13 | Disposition: A | Discharge status: Home / Self Care | Payer: Self-pay | Source: Ambulatory Visit | Attending: Cardiovascular Disease | Admitting: Cardiovascular Disease

## 2017-11-18 ENCOUNTER — Encounter (HOSPITAL_COMMUNITY)
Admission: RE | Admit: 2017-11-18 | Discharge: 2017-11-18 | Disposition: A | Discharge status: Home / Self Care | Payer: Self-pay | Source: Ambulatory Visit | Attending: Cardiovascular Disease | Admitting: Cardiovascular Disease

## 2017-11-20 ENCOUNTER — Encounter (HOSPITAL_COMMUNITY): Payer: Self-pay

## 2017-11-21 ENCOUNTER — Encounter: Payer: Self-pay | Admitting: Emergency Medicine

## 2017-11-21 ENCOUNTER — Ambulatory Visit (INDEPENDENT_AMBULATORY_CARE_PROVIDER_SITE_OTHER): Payer: Medicare Other | Admitting: Emergency Medicine

## 2017-11-21 DIAGNOSIS — J189 Pneumonia, unspecified organism: Secondary | ICD-10-CM | POA: Diagnosis not present

## 2017-11-21 DIAGNOSIS — I255 Ischemic cardiomyopathy: Secondary | ICD-10-CM | POA: Diagnosis not present

## 2017-11-21 DIAGNOSIS — J439 Emphysema, unspecified: Secondary | ICD-10-CM

## 2017-11-21 NOTE — Patient Instructions (Signed)
Please continue Spiriva and Symbicort as you have been taking them Continue Ventolin 2 puffs as needed for shortness of breath, chest tightness We will plan to repeat your CT scan of the chest in September 2019 to compare your right upper lobe scarring with prior films Flu shot up-to-date Follow with Dr Lamonte Sakai in 6 months or sooner if you have any problems

## 2017-11-21 NOTE — Assessment & Plan Note (Signed)
Current presumed aspiration pneumonias.  Overall improved since he had his lap band loosened, no longer having emesis or any evidence for aspiration.  Abnormal CT scan of the chest likely reflects right upper lobe scarring and chronic atelectatic change.  Given his tobacco history we will follow with a repeat scan in September 2019 to ensure no interval change.  Continue swallowing precautions

## 2017-11-21 NOTE — Progress Notes (Signed)
   Subjective:    Patient ID: [name] Alcon., male    DOB: 14-Aug-1941, 76 y.o.   MRN: [id number]  HPI 76 year old former smoker (80 pack years)  ROV 11/21/17 --patient has a history of former tobacco use and COPD, hypertension with diastolic CHF, chronic rhinitis and recurrent right lower lobe pneumonia is felt probably related to recurrent aspiration in the setting of lap band surgery with a hiatal hernia.  Most recent episode was treated for pneumonia in 08/02/17.  He has chronic right upper lobe and right lower lobe atelectatic change/collapse, most recent CT scan 09/05/17. After that PNA his lap band was adjusted and he is no longer having emesis. He is on Spiriva and Symbicort, uses ventolin as needed, about . Minimal cough or mucous. Uses astelin, zyrtec prn. Participates in cardiopulm rehab.    Review of Systems As per HPI     Objective:   Physical Exam Vitals:   11/21/17 1444  BP: 124/80  Pulse: 70  SpO2: 95%  Weight: 208 lb (94.3 kg)  Height: 5\' 3"  (1.6 m)   Gen: Pleasant, overwt, in no distress,  normal affect  ENT: No lesions,  mouth clear,  oropharynx clear, no postnasal drip  Neck: No JVD, no stridor  Lungs: No use of accessory muscles, distant, clear bilaterally  CV: regular, no M gallops, trace peripheral edema  Musculoskeletal: No deformities, no cyanosis or clubbing  Neuro: alert, non focal  Skin: Warm, no lesions or rashes       Assessment & Plan:  Pneumonia Current presumed aspiration pneumonias.  Overall improved since he had his lap band loosened, no longer having emesis or any evidence for aspiration.  Abnormal CT scan of the chest likely reflects right upper lobe scarring and chronic atelectatic change.  Given his tobacco history we will follow with a repeat scan in September 2019 to ensure no interval change.  Continue swallowing precautions  COPD (chronic obstructive pulmonary disease) with emphysema Continue Spiriva, Symbicort.  Ventolin as  needed.  Discussed possibly consideration of changing to either Anoro or Stiolto at some point in the future.  Flu shot up-to-date.  Continue exercise, cardiopulmonary rehab  [name], MD, PhD 11/21/2017, 3:13 PM [facility] [phone] or if no answer [phone]

## 2017-11-21 NOTE — Assessment & Plan Note (Signed)
Continue Spiriva, Symbicort.  Ventolin as needed.  Discussed possibly consideration of changing to either Anoro or Stiolto at some point in the future.  Flu shot up-to-date.  Continue exercise, cardiopulmonary rehab

## 2017-11-22 ENCOUNTER — Encounter (HOSPITAL_COMMUNITY)
Admission: RE | Admit: 2017-11-22 | Discharge: 2017-11-22 | Disposition: A | Discharge status: Home / Self Care | Payer: Self-pay | Source: Ambulatory Visit | Attending: Cardiovascular Disease | Admitting: Cardiovascular Disease

## 2017-11-24 ENCOUNTER — Other Ambulatory Visit: Payer: Self-pay | Admitting: Endocrinology

## 2017-11-25 ENCOUNTER — Encounter (HOSPITAL_COMMUNITY)
Admission: RE | Admit: 2017-11-25 | Discharge: 2017-11-25 | Disposition: A | Discharge status: Home / Self Care | Payer: Self-pay | Source: Ambulatory Visit | Attending: Cardiovascular Disease | Admitting: Cardiovascular Disease

## 2017-11-25 DIAGNOSIS — I509 Heart failure, unspecified: Secondary | ICD-10-CM | POA: Insufficient documentation

## 2017-11-26 ENCOUNTER — Encounter: Payer: Self-pay | Admitting: Endocrinology

## 2017-11-27 ENCOUNTER — Encounter (HOSPITAL_COMMUNITY)
Admission: RE | Admit: 2017-11-27 | Discharge: 2017-11-27 | Disposition: A | Discharge status: Home / Self Care | Payer: Self-pay | Source: Ambulatory Visit | Attending: Cardiovascular Disease | Admitting: Cardiovascular Disease

## 2017-11-28 ENCOUNTER — Other Ambulatory Visit: Payer: Self-pay | Admitting: Endocrinology

## 2017-11-29 ENCOUNTER — Encounter (HOSPITAL_COMMUNITY)
Admission: RE | Admit: 2017-11-29 | Discharge: 2017-11-29 | Disposition: A | Discharge status: Home / Self Care | Payer: Medicare Other | Source: Ambulatory Visit | Attending: Cardiovascular Disease | Admitting: Cardiovascular Disease

## 2017-12-02 ENCOUNTER — Encounter (HOSPITAL_COMMUNITY): Payer: Self-pay

## 2017-12-04 ENCOUNTER — Encounter (HOSPITAL_COMMUNITY): Payer: Self-pay

## 2017-12-05 ENCOUNTER — Other Ambulatory Visit: Payer: Self-pay

## 2017-12-06 ENCOUNTER — Encounter (HOSPITAL_COMMUNITY): Payer: Self-pay

## 2017-12-09 ENCOUNTER — Encounter (HOSPITAL_COMMUNITY)
Admission: RE | Admit: 2017-12-09 | Discharge: 2017-12-09 | Disposition: A | Discharge status: Home / Self Care | Payer: Self-pay | Source: Ambulatory Visit | Attending: Cardiovascular Disease | Admitting: Cardiovascular Disease

## 2017-12-09 ENCOUNTER — Other Ambulatory Visit: Payer: Self-pay | Admitting: Cardiovascular Disease

## 2017-12-09 ENCOUNTER — Other Ambulatory Visit: Payer: Self-pay | Admitting: Endocrinology

## 2017-12-11 ENCOUNTER — Encounter (HOSPITAL_COMMUNITY)
Admission: RE | Admit: 2017-12-11 | Discharge: 2017-12-11 | Disposition: A | Discharge status: Home / Self Care | Payer: Medicare Other | Source: Ambulatory Visit | Attending: Cardiovascular Disease | Admitting: Cardiovascular Disease

## 2017-12-12 NOTE — Telephone Encounter (Signed)
Pt stated that [facility] told him there was a problem getting med   Pt wants to know if there is anything else he can take instead    VIRT-VITE FORTE 2.5-25-2 MG TABS tablet   Please advise

## 2017-12-13 ENCOUNTER — Encounter (HOSPITAL_COMMUNITY): Payer: Self-pay

## 2017-12-16 ENCOUNTER — Other Ambulatory Visit: Payer: Self-pay | Admitting: Endocrinology

## 2017-12-18 ENCOUNTER — Encounter (HOSPITAL_COMMUNITY): Payer: Self-pay

## 2017-12-20 ENCOUNTER — Encounter (HOSPITAL_COMMUNITY): Payer: Self-pay

## 2017-12-23 ENCOUNTER — Encounter (HOSPITAL_COMMUNITY): Payer: Self-pay

## 2017-12-25 ENCOUNTER — Encounter (HOSPITAL_COMMUNITY)
Admission: RE | Admit: 2017-12-25 | Discharge: 2017-12-25 | Disposition: A | Discharge status: Home / Self Care | Payer: Self-pay | Source: Ambulatory Visit | Attending: Cardiovascular Disease | Admitting: Cardiovascular Disease

## 2017-12-25 DIAGNOSIS — I509 Heart failure, unspecified: Secondary | ICD-10-CM | POA: Insufficient documentation

## 2017-12-26 ENCOUNTER — Telehealth: Payer: Self-pay | Admitting: Endocrinology

## 2017-12-26 NOTE — Telephone Encounter (Signed)
Patient stated that pharmacy [facility] do not carry VIRT-VITE FORTE 2.5-25-2 MG TABS tablet [[id number]] anymore asking to prescribe something else. Please advise

## 2017-12-26 NOTE — Telephone Encounter (Signed)
I called patient & stated as advised by Dr. Loanne Drilling to get an OTC vitamin since they no longer carry virt-vite forte. Patient stated that he would do that instead.

## 2017-12-27 ENCOUNTER — Encounter (HOSPITAL_COMMUNITY)
Admission: RE | Admit: 2017-12-27 | Discharge: 2017-12-27 | Disposition: A | Discharge status: Home / Self Care | Payer: Self-pay | Source: Ambulatory Visit | Attending: Cardiovascular Disease | Admitting: Cardiovascular Disease

## 2017-12-30 ENCOUNTER — Encounter (HOSPITAL_COMMUNITY)
Admission: RE | Admit: 2017-12-30 | Discharge: 2017-12-30 | Disposition: A | Discharge status: Home / Self Care | Payer: Self-pay | Source: Ambulatory Visit | Attending: Cardiovascular Disease | Admitting: Cardiovascular Disease

## 2018-01-01 ENCOUNTER — Encounter (HOSPITAL_COMMUNITY)
Admission: RE | Admit: 2018-01-01 | Discharge: 2018-01-01 | Disposition: A | Discharge status: Home / Self Care | Payer: Self-pay | Source: Ambulatory Visit | Attending: Cardiovascular Disease | Admitting: Cardiovascular Disease

## 2018-01-03 ENCOUNTER — Encounter (HOSPITAL_COMMUNITY): Payer: Self-pay

## 2018-01-06 ENCOUNTER — Encounter (HOSPITAL_COMMUNITY): Payer: Self-pay

## 2018-01-08 ENCOUNTER — Encounter (HOSPITAL_COMMUNITY)
Admission: RE | Admit: 2018-01-08 | Discharge: 2018-01-08 | Disposition: A | Discharge status: Home / Self Care | Payer: Self-pay | Source: Ambulatory Visit | Attending: Cardiovascular Disease | Admitting: Cardiovascular Disease

## 2018-01-10 ENCOUNTER — Encounter (HOSPITAL_COMMUNITY): Payer: Self-pay

## 2018-01-13 ENCOUNTER — Encounter (HOSPITAL_COMMUNITY): Payer: Self-pay

## 2018-01-15 ENCOUNTER — Encounter (HOSPITAL_COMMUNITY): Payer: Self-pay

## 2018-01-17 ENCOUNTER — Encounter (HOSPITAL_COMMUNITY): Payer: Self-pay

## 2018-01-20 ENCOUNTER — Encounter (HOSPITAL_COMMUNITY)
Admission: RE | Admit: 2018-01-20 | Discharge: 2018-01-20 | Disposition: A | Discharge status: Home / Self Care | Payer: Self-pay | Source: Ambulatory Visit | Attending: Cardiovascular Disease | Admitting: Cardiovascular Disease

## 2018-01-22 ENCOUNTER — Encounter (HOSPITAL_COMMUNITY): Payer: Self-pay

## 2018-01-24 ENCOUNTER — Encounter (HOSPITAL_COMMUNITY)
Admission: RE | Admit: 2018-01-24 | Discharge: 2018-01-24 | Disposition: A | Discharge status: Home / Self Care | Payer: Self-pay | Source: Ambulatory Visit | Attending: Cardiovascular Disease | Admitting: Cardiovascular Disease

## 2018-01-24 DIAGNOSIS — I2510 Atherosclerotic heart disease of native coronary artery without angina pectoris: Secondary | ICD-10-CM | POA: Insufficient documentation

## 2018-01-27 ENCOUNTER — Encounter: Payer: Self-pay | Admitting: Cardiovascular Disease

## 2018-01-27 ENCOUNTER — Ambulatory Visit (INDEPENDENT_AMBULATORY_CARE_PROVIDER_SITE_OTHER): Payer: Medicare Other | Admitting: Cardiovascular Disease

## 2018-01-27 ENCOUNTER — Encounter (HOSPITAL_COMMUNITY)
Admission: RE | Admit: 2018-01-27 | Discharge: 2018-01-27 | Disposition: A | Discharge status: Home / Self Care | Payer: Self-pay | Source: Ambulatory Visit | Attending: Cardiovascular Disease | Admitting: Cardiovascular Disease

## 2018-01-27 VITALS — BP 100/66 | HR 71 | Ht 63.0 in | Wt 214.4 lb

## 2018-01-27 DIAGNOSIS — E7800 Pure hypercholesterolemia, unspecified: Secondary | ICD-10-CM

## 2018-01-27 DIAGNOSIS — I255 Ischemic cardiomyopathy: Secondary | ICD-10-CM

## 2018-01-27 DIAGNOSIS — I10 Essential (primary) hypertension: Secondary | ICD-10-CM | POA: Diagnosis not present

## 2018-01-27 DIAGNOSIS — I2510 Atherosclerotic heart disease of native coronary artery without angina pectoris: Secondary | ICD-10-CM | POA: Diagnosis not present

## 2018-01-27 DIAGNOSIS — I5042 Chronic combined systolic (congestive) and diastolic (congestive) heart failure: Secondary | ICD-10-CM | POA: Diagnosis not present
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
## 2018-01-27 NOTE — Patient Instructions (Signed)

## 2018-01-28 ENCOUNTER — Encounter: Payer: Self-pay | Admitting: Endocrinology

## 2018-01-28 ENCOUNTER — Ambulatory Visit (INDEPENDENT_AMBULATORY_CARE_PROVIDER_SITE_OTHER): Payer: Medicare Other | Admitting: Endocrinology

## 2018-01-28 VITALS — BP 116/62 | HR 76 | Wt 213.6 lb

## 2018-01-28 DIAGNOSIS — I255 Ischemic cardiomyopathy: Secondary | ICD-10-CM

## 2018-01-28 DIAGNOSIS — E1151 Type 2 diabetes mellitus with diabetic peripheral angiopathy without gangrene: Secondary | ICD-10-CM | POA: Diagnosis not present

## 2018-01-28 DIAGNOSIS — Z794 Long term (current) use of insulin: Secondary | ICD-10-CM

## 2018-01-28 LAB — POCT GLYCOSYLATED HEMOGLOBIN (HGB A1C): Hemoglobin A1C: 7.2

## 2018-01-28 NOTE — Patient Instructions (Addendum)
check your blood sugar twice a day.  vary the time of day when you check, between before the 3 meals, and at bedtime.  also check if you have symptoms of your blood sugar being too high or too low.  please keep a record of the readings and bring it to your next appointment here (or you can bring the meter itself).  You can write it on any piece of paper.  please call us sooner if your blood sugar goes below 70, or if you have a lot of readings over 200.  Please continue the same insulin Please come back for a follow-up appointment in 3 months.    
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
## 2018-01-29 ENCOUNTER — Encounter (HOSPITAL_COMMUNITY)
Admission: RE | Admit: 2018-01-29 | Discharge: 2018-01-29 | Disposition: A | Discharge status: Home / Self Care | Payer: Self-pay | Source: Ambulatory Visit | Attending: Cardiovascular Disease | Admitting: Cardiovascular Disease

## 2018-01-29 NOTE — Addendum Note (Signed)
Addended by: [name] on: 01/29/2018 09:52 AM   Modules accepted: Level of Service

## 2018-01-31 ENCOUNTER — Encounter (HOSPITAL_COMMUNITY)
Admission: RE | Admit: 2018-01-31 | Discharge: 2018-01-31 | Disposition: A | Discharge status: Home / Self Care | Payer: Self-pay | Source: Ambulatory Visit | Attending: Cardiovascular Disease | Admitting: Cardiovascular Disease

## 2018-02-03 ENCOUNTER — Encounter (HOSPITAL_COMMUNITY)
Admission: RE | Admit: 2018-02-03 | Discharge: 2018-02-03 | Disposition: A | Discharge status: Home / Self Care | Payer: Self-pay | Source: Ambulatory Visit | Attending: Cardiovascular Disease | Admitting: Cardiovascular Disease

## 2018-02-04 ENCOUNTER — Other Ambulatory Visit: Payer: Self-pay

## 2018-02-04 ENCOUNTER — Ambulatory Visit (HOSPITAL_COMMUNITY): Payer: Medicare Other | Attending: Cardiology

## 2018-02-04 DIAGNOSIS — E785 Hyperlipidemia, unspecified: Secondary | ICD-10-CM | POA: Diagnosis not present

## 2018-02-04 DIAGNOSIS — I2510 Atherosclerotic heart disease of native coronary artery without angina pectoris: Secondary | ICD-10-CM | POA: Diagnosis not present

## 2018-02-04 DIAGNOSIS — I509 Heart failure, unspecified: Secondary | ICD-10-CM | POA: Insufficient documentation

## 2018-02-04 DIAGNOSIS — G459 Transient cerebral ischemic attack, unspecified: Secondary | ICD-10-CM | POA: Insufficient documentation

## 2018-02-04 DIAGNOSIS — R609 Edema, unspecified: Secondary | ICD-10-CM | POA: Insufficient documentation

## 2018-02-04 DIAGNOSIS — Z87891 Personal history of nicotine dependence: Secondary | ICD-10-CM | POA: Diagnosis not present

## 2018-02-04 DIAGNOSIS — Z8249 Family history of ischemic heart disease and other diseases of the circulatory system: Secondary | ICD-10-CM | POA: Diagnosis not present

## 2018-02-04 DIAGNOSIS — J449 Chronic obstructive pulmonary disease, unspecified: Secondary | ICD-10-CM | POA: Insufficient documentation

## 2018-02-04 DIAGNOSIS — I130 Hypertensive heart and chronic kidney disease with heart failure and stage 1 through stage 4 chronic kidney disease, or unspecified chronic kidney disease: Secondary | ICD-10-CM | POA: Insufficient documentation

## 2018-02-04 DIAGNOSIS — E1122 Type 2 diabetes mellitus with diabetic chronic kidney disease: Secondary | ICD-10-CM | POA: Diagnosis not present

## 2018-02-04 DIAGNOSIS — I255 Ischemic cardiomyopathy: Secondary | ICD-10-CM | POA: Diagnosis not present

## 2018-02-04 DIAGNOSIS — N189 Chronic kidney disease, unspecified: Secondary | ICD-10-CM | POA: Diagnosis not present

## 2018-02-04 DIAGNOSIS — I252 Old myocardial infarction: Secondary | ICD-10-CM | POA: Insufficient documentation

## 2018-02-04 MED ORDER — PERFLUTREN LIPID MICROSPHERE
1.0000 mL | INTRAVENOUS | Status: AC | PRN
  Administered 2018-02-04: 2 mL via INTRAVENOUS

## 2018-02-05 ENCOUNTER — Encounter (HOSPITAL_COMMUNITY)
Admission: RE | Admit: 2018-02-05 | Discharge: 2018-02-05 | Disposition: A | Discharge status: Home / Self Care | Payer: Self-pay | Source: Ambulatory Visit | Attending: Cardiovascular Disease | Admitting: Cardiovascular Disease

## 2018-02-07 ENCOUNTER — Encounter (HOSPITAL_COMMUNITY): Payer: Self-pay

## 2018-02-10 ENCOUNTER — Encounter (HOSPITAL_COMMUNITY): Payer: Self-pay

## 2018-02-12 ENCOUNTER — Encounter (HOSPITAL_COMMUNITY): Payer: Self-pay

## 2018-02-14 ENCOUNTER — Encounter (HOSPITAL_COMMUNITY): Payer: Self-pay

## 2018-02-17 ENCOUNTER — Encounter (HOSPITAL_COMMUNITY)
Admission: RE | Admit: 2018-02-17 | Discharge: 2018-02-17 | Disposition: A | Discharge status: Home / Self Care | Payer: Self-pay | Source: Ambulatory Visit | Attending: Cardiovascular Disease | Admitting: Cardiovascular Disease

## 2018-02-19 ENCOUNTER — Encounter (HOSPITAL_COMMUNITY)
Admission: RE | Admit: 2018-02-19 | Discharge: 2018-02-19 | Disposition: A | Discharge status: Home / Self Care | Payer: Self-pay | Source: Ambulatory Visit | Attending: Cardiovascular Disease | Admitting: Cardiovascular Disease

## 2018-02-20 ENCOUNTER — Other Ambulatory Visit: Payer: Self-pay | Admitting: Endocrinology

## 2018-02-21 ENCOUNTER — Encounter (HOSPITAL_COMMUNITY)
Admission: RE | Admit: 2018-02-21 | Discharge: 2018-02-21 | Disposition: A | Discharge status: Home / Self Care | Payer: Self-pay | Source: Ambulatory Visit | Attending: Cardiovascular Disease | Admitting: Cardiovascular Disease

## 2018-02-21 DIAGNOSIS — I2510 Atherosclerotic heart disease of native coronary artery without angina pectoris: Secondary | ICD-10-CM | POA: Insufficient documentation

## 2018-02-24 ENCOUNTER — Encounter (HOSPITAL_COMMUNITY): Payer: Self-pay

## 2018-02-26 ENCOUNTER — Encounter (HOSPITAL_COMMUNITY): Payer: Self-pay

## 2018-02-28 ENCOUNTER — Encounter (HOSPITAL_COMMUNITY)
Admission: RE | Admit: 2018-02-28 | Discharge: 2018-02-28 | Disposition: A | Discharge status: Home / Self Care | Payer: Self-pay | Source: Ambulatory Visit | Attending: Cardiovascular Disease | Admitting: Cardiovascular Disease

## 2018-03-01 ENCOUNTER — Other Ambulatory Visit: Payer: Self-pay | Admitting: Endocrinology

## 2018-03-03 ENCOUNTER — Encounter (HOSPITAL_COMMUNITY)
Admission: RE | Admit: 2018-03-03 | Discharge: 2018-03-03 | Disposition: A | Discharge status: Home / Self Care | Payer: Self-pay | Source: Ambulatory Visit | Attending: Cardiovascular Disease | Admitting: Cardiovascular Disease

## 2018-03-05 ENCOUNTER — Ambulatory Visit: Payer: Medicare Other | Admitting: Cardiovascular Disease

## 2018-03-05 ENCOUNTER — Encounter (HOSPITAL_COMMUNITY): Payer: Self-pay

## 2018-03-07 ENCOUNTER — Encounter (HOSPITAL_COMMUNITY)
Admission: RE | Admit: 2018-03-07 | Discharge: 2018-03-07 | Disposition: A | Discharge status: Home / Self Care | Payer: Medicare Other | Source: Ambulatory Visit | Attending: Cardiovascular Disease | Admitting: Cardiovascular Disease

## 2018-03-10 ENCOUNTER — Encounter (HOSPITAL_COMMUNITY): Payer: Self-pay

## 2018-03-12 ENCOUNTER — Encounter (HOSPITAL_COMMUNITY): Payer: Self-pay

## 2018-03-14 ENCOUNTER — Encounter (HOSPITAL_COMMUNITY): Payer: Self-pay

## 2018-03-17 ENCOUNTER — Encounter (HOSPITAL_COMMUNITY)
Admission: RE | Admit: 2018-03-17 | Discharge: 2018-03-17 | Disposition: A | Discharge status: Home / Self Care | Payer: Self-pay | Source: Ambulatory Visit | Attending: Cardiovascular Disease | Admitting: Cardiovascular Disease

## 2018-03-17 ENCOUNTER — Other Ambulatory Visit: Payer: Self-pay | Admitting: Endocrinology

## 2018-03-19 ENCOUNTER — Encounter (HOSPITAL_COMMUNITY)
Admission: RE | Admit: 2018-03-19 | Discharge: 2018-03-19 | Disposition: A | Discharge status: Home / Self Care | Payer: Self-pay | Source: Ambulatory Visit | Attending: Cardiovascular Disease | Admitting: Cardiovascular Disease

## 2018-03-21 ENCOUNTER — Encounter (HOSPITAL_COMMUNITY)
Admission: RE | Admit: 2018-03-21 | Discharge: 2018-03-21 | Disposition: A | Discharge status: Home / Self Care | Payer: Self-pay | Source: Ambulatory Visit | Attending: Cardiovascular Disease | Admitting: Cardiovascular Disease

## 2018-03-24 ENCOUNTER — Encounter (HOSPITAL_COMMUNITY)
Admission: RE | Admit: 2018-03-24 | Discharge: 2018-03-24 | Disposition: A | Discharge status: Home / Self Care | Payer: Self-pay | Source: Ambulatory Visit | Attending: Cardiovascular Disease | Admitting: Cardiovascular Disease

## 2018-03-24 DIAGNOSIS — I2510 Atherosclerotic heart disease of native coronary artery without angina pectoris: Secondary | ICD-10-CM | POA: Insufficient documentation

## 2018-03-26 ENCOUNTER — Encounter (HOSPITAL_COMMUNITY): Payer: Self-pay

## 2018-03-26 ENCOUNTER — Other Ambulatory Visit: Payer: Medicare Other

## 2018-03-26 ENCOUNTER — Ambulatory Visit (INDEPENDENT_AMBULATORY_CARE_PROVIDER_SITE_OTHER)
Admission: RE | Admit: 2018-03-26 | Discharge: 2018-03-26 | Disposition: A | Discharge status: Home / Self Care | Payer: Medicare Other | Source: Ambulatory Visit | Attending: Emergency Medicine | Admitting: Emergency Medicine

## 2018-03-26 ENCOUNTER — Ambulatory Visit (INDEPENDENT_AMBULATORY_CARE_PROVIDER_SITE_OTHER): Payer: Medicare Other | Admitting: Emergency Medicine

## 2018-03-26 ENCOUNTER — Encounter: Payer: Self-pay | Admitting: Emergency Medicine

## 2018-03-26 VITALS — BP 120/80 | HR 78 | Ht 63.0 in | Wt 222.0 lb

## 2018-03-26 DIAGNOSIS — R059 Cough, unspecified: Secondary | ICD-10-CM

## 2018-03-26 DIAGNOSIS — J440 Chronic obstructive pulmonary disease with acute lower respiratory infection: Secondary | ICD-10-CM | POA: Diagnosis not present

## 2018-03-26 DIAGNOSIS — J209 Acute bronchitis, unspecified: Secondary | ICD-10-CM | POA: Diagnosis not present

## 2018-03-26 DIAGNOSIS — R05 Cough: Secondary | ICD-10-CM

## 2018-03-26 DIAGNOSIS — J439 Emphysema, unspecified: Secondary | ICD-10-CM | POA: Diagnosis not present

## 2018-03-26 DIAGNOSIS — I255 Ischemic cardiomyopathy: Secondary | ICD-10-CM

## 2018-03-26 MED ORDER — LEVOFLOXACIN 500 MG PO TABS: 500.0000 mg | ORAL_TABLET | Freq: Every day | ORAL | 0 refills | Status: DC

## 2018-03-26 NOTE — Patient Instructions (Addendum)
We will perform a chest x-ray today. We will perform a sputum culture today. Please take levofloxacin 500 mg daily for the next 7 days. Continue your current inhaled medications as you have been using them Follow with Dr Lamonte Sakai or APP in 3 weeks to insure that you have improved. Call us if worse in any way.

## 2018-03-26 NOTE — Addendum Note (Signed)
Addended by: Desmond Dike C on: 03/26/2018 03:39 PM   Modules accepted: Orders

## 2018-03-26 NOTE — Progress Notes (Signed)
   Subjective:    Patient ID: [name]., male    DOB: [birth year], 77 y.o.   MRN: [id number]  HPI 77 year old former smoker (80 pack years)  ROV 11/21/17 --patient has a history of former tobacco use and COPD, hypertension with diastolic CHF, chronic rhinitis and recurrent right lower lobe pneumonia is felt probably related to recurrent aspiration in the setting of lap band surgery with a hiatal hernia.  Most recent episode was treated for pneumonia in 08/02/17.  He has chronic right upper lobe and right lower lobe atelectatic change/collapse, most recent CT scan 09/05/17. After that PNA his lap band was adjusted and he is no longer having emesis. He is on Spiriva and Symbicort, uses ventolin as needed, about . Minimal cough or mucous. Uses astelin, zyrtec prn. Participates in cardiopulm rehab.   Acute OV 03/26/18 --77 year old gentleman with a history of COPD, diastolic CHF, chronic rhinitis, recurrent right lower lobe pneumonias that may be related to high risk for aspiration following lap band surgery and a hiatal hernia.  Chronic management is with Spiriva, Symbicort, albuterol as needed.  He returns today reporting that beginning 1 week ago he has had increased nasal gtt, malaise, wheeze, dyspnea. Green mucous from his chest. No fever, chills, chest pain. He started flonase, mucinex.    Review of Systems As per HPI     Objective:   Physical Exam Vitals:   03/26/18 1517  BP: 120/80  Pulse: 78  SpO2: 95%  Weight: 222 lb (100.7 kg)  Height: 5\' 3"  (1.6 m)   Gen: Pleasant, overwt, in no distress,  normal affect  ENT: No lesions,  mouth clear,  oropharynx clear, no postnasal drip  Neck: No JVD, no stridor  Lungs: No use of accessory muscles, distant, clear bilaterally without wheeze  CV: regular, no M gallops, trace peripheral edema  Musculoskeletal: No deformities, no cyanosis or clubbing  Neuro: alert, non focal  Skin: Warm, no lesions or rashes       Assessment &  Plan:  Acute bronchitis with COPD (HCC) Versus pneumonia.  We will check a chest x-ray today.  I will also get a sputum culture as he has frequent pneumonias and could be colonized with an atypical organism.  I do not hear any wheezing on exam today so I will defer steroids.  I will treat him with levofloxacin and follow for interval improvement.  COPD (chronic obstructive pulmonary disease) with emphysema No wheezing today.  Treating for bronchitis and continue his current bronchodilator regimen.  Low threshold to add steroid taper should he worsen in any way.  [name], MD, PhD 03/26/2018, 3:35 PM [facility] [phone] or if no answer [phone]

## 2018-03-26 NOTE — Assessment & Plan Note (Signed)
Versus pneumonia.  We will check a chest x-ray today.  I will also get a sputum culture as he has frequent pneumonias and could be colonized with an atypical organism.  I do not hear any wheezing on exam today so I will defer steroids.  I will treat him with levofloxacin and follow for interval improvement.

## 2018-03-26 NOTE — Assessment & Plan Note (Signed)
No wheezing today.  Treating for bronchitis and continue his current bronchodilator regimen.  Low threshold to add steroid taper should he worsen in any way.

## 2018-03-28 ENCOUNTER — Telehealth (HOSPITAL_COMMUNITY): Payer: Self-pay

## 2018-03-28 ENCOUNTER — Encounter (HOSPITAL_COMMUNITY): Payer: Self-pay

## 2018-03-28 NOTE — Telephone Encounter (Signed)
Patient called and will not be attending class today. Patient stated he is having Pulmonary problems and is on a strong antibiotic.

## 2018-03-29 LAB — RESPIRATORY CULTURE OR RESPIRATORY AND SPUTUM CULTURE
MICRO NUMBER: 90412156
SPECIMEN QUALITY:: ADEQUATE

## 2018-03-31 ENCOUNTER — Encounter (HOSPITAL_COMMUNITY)
Admission: RE | Admit: 2018-03-31 | Discharge: 2018-03-31 | Disposition: A | Discharge status: Home / Self Care | Payer: Self-pay | Source: Ambulatory Visit | Attending: Cardiovascular Disease | Admitting: Cardiovascular Disease

## 2018-04-02 ENCOUNTER — Encounter (HOSPITAL_COMMUNITY): Payer: Self-pay

## 2018-04-04 ENCOUNTER — Encounter (HOSPITAL_COMMUNITY)
Admission: RE | Admit: 2018-04-04 | Discharge: 2018-04-04 | Disposition: A | Discharge status: Home / Self Care | Payer: Self-pay | Source: Ambulatory Visit | Attending: Cardiovascular Disease | Admitting: Cardiovascular Disease

## 2018-04-07 ENCOUNTER — Encounter (HOSPITAL_COMMUNITY)
Admission: RE | Admit: 2018-04-07 | Discharge: 2018-04-07 | Disposition: A | Discharge status: Home / Self Care | Payer: Self-pay | Source: Ambulatory Visit | Attending: Cardiovascular Disease | Admitting: Cardiovascular Disease

## 2018-04-09 ENCOUNTER — Encounter (HOSPITAL_COMMUNITY): Payer: Self-pay

## 2018-04-11 ENCOUNTER — Encounter (HOSPITAL_COMMUNITY)
Admission: RE | Admit: 2018-04-11 | Discharge: 2018-04-11 | Disposition: A | Discharge status: Home / Self Care | Payer: Self-pay | Source: Ambulatory Visit | Attending: Cardiovascular Disease | Admitting: Cardiovascular Disease

## 2018-04-14 ENCOUNTER — Encounter (HOSPITAL_COMMUNITY)
Admission: RE | Admit: 2018-04-14 | Discharge: 2018-04-14 | Disposition: A | Discharge status: Home / Self Care | Payer: Medicare Other | Source: Ambulatory Visit | Attending: Cardiovascular Disease | Admitting: Cardiovascular Disease

## 2018-04-16 ENCOUNTER — Encounter (HOSPITAL_COMMUNITY)
Admission: RE | Admit: 2018-04-16 | Discharge: 2018-04-16 | Disposition: A | Discharge status: Home / Self Care | Payer: Medicare Other | Source: Ambulatory Visit | Attending: Cardiovascular Disease | Admitting: Cardiovascular Disease

## 2018-04-18 ENCOUNTER — Encounter (HOSPITAL_COMMUNITY)
Admission: RE | Admit: 2018-04-18 | Discharge: 2018-04-18 | Disposition: A | Discharge status: Home / Self Care | Payer: Medicare Other | Source: Ambulatory Visit | Attending: Cardiovascular Disease | Admitting: Cardiovascular Disease

## 2018-04-21 ENCOUNTER — Encounter (HOSPITAL_COMMUNITY)
Admission: RE | Admit: 2018-04-21 | Discharge: 2018-04-21 | Disposition: A | Discharge status: Home / Self Care | Payer: Self-pay | Source: Ambulatory Visit | Attending: Cardiovascular Disease | Admitting: Cardiovascular Disease

## 2018-04-21 ENCOUNTER — Ambulatory Visit: Payer: Medicare Other | Admitting: Adult Health

## 2018-04-23 ENCOUNTER — Encounter (HOSPITAL_COMMUNITY)
Admission: RE | Admit: 2018-04-23 | Discharge: 2018-04-23 | Disposition: A | Discharge status: Home / Self Care | Payer: Self-pay | Source: Ambulatory Visit | Attending: Cardiovascular Disease | Admitting: Cardiovascular Disease

## 2018-04-23 DIAGNOSIS — I2510 Atherosclerotic heart disease of native coronary artery without angina pectoris: Secondary | ICD-10-CM | POA: Insufficient documentation

## 2018-04-25 ENCOUNTER — Encounter (HOSPITAL_COMMUNITY)
Admission: RE | Admit: 2018-04-25 | Discharge: 2018-04-25 | Disposition: A | Discharge status: Home / Self Care | Payer: Self-pay | Source: Ambulatory Visit | Attending: Cardiovascular Disease | Admitting: Cardiovascular Disease

## 2018-04-28 ENCOUNTER — Encounter (HOSPITAL_COMMUNITY)
Admission: RE | Admit: 2018-04-28 | Discharge: 2018-04-28 | Disposition: A | Discharge status: Home / Self Care | Payer: Self-pay | Source: Ambulatory Visit | Attending: Cardiovascular Disease | Admitting: Cardiovascular Disease

## 2018-04-29 ENCOUNTER — Ambulatory Visit (INDEPENDENT_AMBULATORY_CARE_PROVIDER_SITE_OTHER): Payer: Medicare Other | Admitting: Endocrinology

## 2018-04-29 ENCOUNTER — Encounter: Payer: Self-pay | Admitting: Endocrinology

## 2018-04-29 VITALS — BP 122/64 | HR 73 | Wt 217.0 lb

## 2018-04-29 DIAGNOSIS — I255 Ischemic cardiomyopathy: Secondary | ICD-10-CM

## 2018-04-29 DIAGNOSIS — E1151 Type 2 diabetes mellitus with diabetic peripheral angiopathy without gangrene: Secondary | ICD-10-CM | POA: Diagnosis not present

## 2018-04-29 DIAGNOSIS — Z794 Long term (current) use of insulin: Secondary | ICD-10-CM

## 2018-04-29 LAB — POCT GLYCOSYLATED HEMOGLOBIN (HGB A1C): Hemoglobin A1C: 7.4

## 2018-04-29 MED ORDER — PANTOPRAZOLE SODIUM 40 MG PO TBEC: 40.0000 mg | DELAYED_RELEASE_TABLET | Freq: Every day | ORAL | 3 refills | Status: DC

## 2018-04-29 MED ORDER — INSULIN NPH (HUMAN) (ISOPHANE) 100 UNIT/ML ~~LOC~~ SUSP: 3.0000 [IU] | Freq: Every day | SUBCUTANEOUS | 11 refills | Status: DC

## 2018-04-29 NOTE — Patient Instructions (Addendum)
check your blood sugar twice a day.  vary the time of day when you check, between before the 3 meals, and at bedtime.  also check if you have symptoms of your blood sugar being too high or too low.  please keep a record of the readings and bring it to your next appointment here (or you can bring the meter itself).  You can write it on any piece of paper.  please call us sooner if your blood sugar goes below 70, or if you have a lot of readings over 200.  Please continue the same novolog, and: I have sent a prescription to your pharmacy, to add NPH, 3 units at bedtime.   Please come back for a follow-up appointment in 4 months.
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
## 2018-04-30 ENCOUNTER — Encounter (HOSPITAL_COMMUNITY): Payer: Self-pay

## 2018-05-02 ENCOUNTER — Encounter (HOSPITAL_COMMUNITY)
Admission: RE | Admit: 2018-05-02 | Discharge: 2018-05-02 | Disposition: A | Discharge status: Home / Self Care | Payer: Self-pay | Source: Ambulatory Visit | Attending: Cardiovascular Disease | Admitting: Cardiovascular Disease

## 2018-05-05 ENCOUNTER — Encounter (HOSPITAL_COMMUNITY)
Admission: RE | Admit: 2018-05-05 | Discharge: 2018-05-05 | Disposition: A | Discharge status: Home / Self Care | Payer: Self-pay | Source: Ambulatory Visit | Attending: Cardiovascular Disease | Admitting: Cardiovascular Disease

## 2018-05-07 ENCOUNTER — Encounter (HOSPITAL_COMMUNITY)
Admission: RE | Admit: 2018-05-07 | Discharge: 2018-05-07 | Disposition: A | Discharge status: Home / Self Care | Payer: Self-pay | Source: Ambulatory Visit | Attending: Cardiovascular Disease | Admitting: Cardiovascular Disease

## 2018-05-09 ENCOUNTER — Encounter (HOSPITAL_COMMUNITY)
Admission: RE | Admit: 2018-05-09 | Discharge: 2018-05-09 | Disposition: A | Discharge status: Home / Self Care | Payer: Medicare Other | Source: Ambulatory Visit | Attending: Cardiovascular Disease | Admitting: Cardiovascular Disease

## 2018-05-12 ENCOUNTER — Encounter (HOSPITAL_COMMUNITY)
Admission: RE | Admit: 2018-05-12 | Discharge: 2018-05-12 | Disposition: A | Discharge status: Home / Self Care | Payer: Self-pay | Source: Ambulatory Visit | Attending: Cardiovascular Disease | Admitting: Cardiovascular Disease

## 2018-05-14 ENCOUNTER — Encounter (HOSPITAL_COMMUNITY): Payer: Self-pay

## 2018-05-16 ENCOUNTER — Encounter (HOSPITAL_COMMUNITY)
Admission: RE | Admit: 2018-05-16 | Discharge: 2018-05-16 | Disposition: A | Discharge status: Home / Self Care | Payer: Self-pay | Source: Ambulatory Visit | Attending: Cardiovascular Disease | Admitting: Cardiovascular Disease

## 2018-05-21 ENCOUNTER — Encounter (HOSPITAL_COMMUNITY)
Admission: RE | Admit: 2018-05-21 | Discharge: 2018-05-21 | Disposition: A | Discharge status: Home / Self Care | Payer: Medicare Other | Source: Ambulatory Visit | Attending: Cardiovascular Disease | Admitting: Cardiovascular Disease

## 2018-05-22 DIAGNOSIS — Z9884 Bariatric surgery status: Secondary | ICD-10-CM | POA: Diagnosis not present

## 2018-05-22 DIAGNOSIS — E119 Type 2 diabetes mellitus without complications: Secondary | ICD-10-CM | POA: Diagnosis not present

## 2018-05-22 DIAGNOSIS — E669 Obesity, unspecified: Secondary | ICD-10-CM | POA: Diagnosis not present

## 2018-05-22 DIAGNOSIS — J449 Chronic obstructive pulmonary disease, unspecified: Secondary | ICD-10-CM | POA: Diagnosis not present

## 2018-05-22 DIAGNOSIS — I10 Essential (primary) hypertension: Secondary | ICD-10-CM | POA: Diagnosis not present

## 2018-05-23 ENCOUNTER — Encounter (HOSPITAL_COMMUNITY): Payer: Self-pay

## 2018-05-26 ENCOUNTER — Other Ambulatory Visit: Payer: Self-pay | Admitting: Endocrinology

## 2018-05-26 ENCOUNTER — Encounter (HOSPITAL_COMMUNITY): Payer: Self-pay

## 2018-05-26 DIAGNOSIS — I2510 Atherosclerotic heart disease of native coronary artery without angina pectoris: Secondary | ICD-10-CM | POA: Insufficient documentation

## 2018-05-28 ENCOUNTER — Encounter (HOSPITAL_COMMUNITY)
Admission: RE | Admit: 2018-05-28 | Discharge: 2018-05-28 | Disposition: A | Discharge status: Home / Self Care | Payer: Self-pay | Source: Ambulatory Visit | Attending: Cardiovascular Disease | Admitting: Cardiovascular Disease

## 2018-05-30 ENCOUNTER — Encounter (HOSPITAL_COMMUNITY): Payer: Self-pay

## 2018-06-02 ENCOUNTER — Encounter (HOSPITAL_COMMUNITY)
Admission: RE | Admit: 2018-06-02 | Discharge: 2018-06-02 | Disposition: A | Discharge status: Home / Self Care | Payer: Self-pay | Source: Ambulatory Visit | Attending: Cardiovascular Disease | Admitting: Cardiovascular Disease

## 2018-06-04 ENCOUNTER — Encounter (HOSPITAL_COMMUNITY)
Admission: RE | Admit: 2018-06-04 | Discharge: 2018-06-04 | Disposition: A | Discharge status: Home / Self Care | Payer: Self-pay | Source: Ambulatory Visit | Attending: Cardiovascular Disease | Admitting: Cardiovascular Disease

## 2018-06-06 ENCOUNTER — Encounter (HOSPITAL_COMMUNITY): Payer: Self-pay

## 2018-06-08 ENCOUNTER — Other Ambulatory Visit: Payer: Self-pay | Admitting: Endocrinology

## 2018-06-09 ENCOUNTER — Encounter (HOSPITAL_COMMUNITY)
Admission: RE | Admit: 2018-06-09 | Discharge: 2018-06-09 | Disposition: A | Discharge status: Home / Self Care | Payer: Self-pay | Source: Ambulatory Visit | Attending: Cardiovascular Disease | Admitting: Cardiovascular Disease

## 2018-06-11 ENCOUNTER — Encounter (HOSPITAL_COMMUNITY)
Admission: RE | Admit: 2018-06-11 | Discharge: 2018-06-11 | Disposition: A | Discharge status: Home / Self Care | Payer: Medicare Other | Source: Ambulatory Visit | Attending: Cardiovascular Disease | Admitting: Cardiovascular Disease

## 2018-06-13 ENCOUNTER — Encounter (HOSPITAL_COMMUNITY)
Admission: RE | Admit: 2018-06-13 | Discharge: 2018-06-13 | Disposition: A | Discharge status: Home / Self Care | Payer: Self-pay | Source: Ambulatory Visit | Attending: Cardiovascular Disease | Admitting: Cardiovascular Disease

## 2018-06-16 ENCOUNTER — Encounter (HOSPITAL_COMMUNITY)
Admission: RE | Admit: 2018-06-16 | Discharge: 2018-06-16 | Disposition: A | Discharge status: Home / Self Care | Payer: Self-pay | Source: Ambulatory Visit | Attending: Cardiovascular Disease | Admitting: Cardiovascular Disease

## 2018-06-18 ENCOUNTER — Encounter (HOSPITAL_COMMUNITY): Payer: Self-pay

## 2018-06-20 ENCOUNTER — Encounter (HOSPITAL_COMMUNITY): Payer: Self-pay

## 2018-06-23 ENCOUNTER — Encounter (HOSPITAL_COMMUNITY)
Admission: RE | Admit: 2018-06-23 | Discharge: 2018-06-23 | Disposition: A | Discharge status: Home / Self Care | Payer: Self-pay | Source: Ambulatory Visit | Attending: Cardiovascular Disease | Admitting: Cardiovascular Disease

## 2018-06-23 DIAGNOSIS — I2510 Atherosclerotic heart disease of native coronary artery without angina pectoris: Secondary | ICD-10-CM | POA: Insufficient documentation

## 2018-06-25 ENCOUNTER — Encounter (HOSPITAL_COMMUNITY)
Admission: RE | Admit: 2018-06-25 | Discharge: 2018-06-25 | Disposition: A | Discharge status: Home / Self Care | Payer: Self-pay | Source: Ambulatory Visit | Attending: Cardiovascular Disease | Admitting: Cardiovascular Disease

## 2018-06-27 ENCOUNTER — Encounter (HOSPITAL_COMMUNITY)
Admission: RE | Admit: 2018-06-27 | Discharge: 2018-06-27 | Disposition: A | Discharge status: Home / Self Care | Payer: Self-pay | Source: Ambulatory Visit | Attending: Cardiovascular Disease | Admitting: Cardiovascular Disease

## 2018-06-30 ENCOUNTER — Encounter (HOSPITAL_COMMUNITY)
Admission: RE | Admit: 2018-06-30 | Discharge: 2018-06-30 | Disposition: A | Discharge status: Home / Self Care | Payer: Self-pay | Source: Ambulatory Visit | Attending: Cardiovascular Disease | Admitting: Cardiovascular Disease

## 2018-07-02 ENCOUNTER — Encounter (HOSPITAL_COMMUNITY)
Admission: RE | Admit: 2018-07-02 | Discharge: 2018-07-02 | Disposition: A | Discharge status: Home / Self Care | Payer: Self-pay | Source: Ambulatory Visit | Attending: Cardiovascular Disease | Admitting: Cardiovascular Disease

## 2018-07-04 ENCOUNTER — Encounter (HOSPITAL_COMMUNITY): Payer: Self-pay

## 2018-07-07 ENCOUNTER — Encounter (HOSPITAL_COMMUNITY)
Admission: RE | Admit: 2018-07-07 | Discharge: 2018-07-07 | Disposition: A | Discharge status: Home / Self Care | Payer: Medicare Other | Source: Ambulatory Visit | Attending: Cardiovascular Disease | Admitting: Cardiovascular Disease

## 2018-07-09 ENCOUNTER — Encounter (HOSPITAL_COMMUNITY): Payer: Self-pay

## 2018-07-11 ENCOUNTER — Encounter (HOSPITAL_COMMUNITY)
Admission: RE | Admit: 2018-07-11 | Discharge: 2018-07-11 | Disposition: A | Discharge status: Home / Self Care | Payer: Self-pay | Source: Ambulatory Visit | Attending: Cardiovascular Disease | Admitting: Cardiovascular Disease

## 2018-07-14 ENCOUNTER — Encounter (HOSPITAL_COMMUNITY): Payer: Self-pay

## 2018-07-16 ENCOUNTER — Encounter (HOSPITAL_COMMUNITY)
Admission: RE | Admit: 2018-07-16 | Discharge: 2018-07-16 | Disposition: A | Discharge status: Home / Self Care | Payer: Self-pay | Source: Ambulatory Visit | Attending: Cardiovascular Disease | Admitting: Cardiovascular Disease

## 2018-07-18 ENCOUNTER — Encounter (HOSPITAL_COMMUNITY)
Admission: RE | Admit: 2018-07-18 | Discharge: 2018-07-18 | Disposition: A | Discharge status: Home / Self Care | Payer: Self-pay | Source: Ambulatory Visit | Attending: Cardiovascular Disease | Admitting: Cardiovascular Disease

## 2018-07-21 ENCOUNTER — Encounter (HOSPITAL_COMMUNITY)
Admission: RE | Admit: 2018-07-21 | Discharge: 2018-07-21 | Disposition: A | Discharge status: Home / Self Care | Payer: Self-pay | Source: Ambulatory Visit | Attending: Cardiovascular Disease | Admitting: Cardiovascular Disease

## 2018-07-23 ENCOUNTER — Encounter (HOSPITAL_COMMUNITY)
Admission: RE | Admit: 2018-07-23 | Discharge: 2018-07-23 | Disposition: A | Discharge status: Home / Self Care | Payer: Self-pay | Source: Ambulatory Visit | Attending: Cardiovascular Disease | Admitting: Cardiovascular Disease

## 2018-07-25 ENCOUNTER — Encounter (HOSPITAL_COMMUNITY): Payer: Self-pay

## 2018-07-25 DIAGNOSIS — I2510 Atherosclerotic heart disease of native coronary artery without angina pectoris: Secondary | ICD-10-CM | POA: Insufficient documentation

## 2018-07-28 ENCOUNTER — Other Ambulatory Visit: Payer: Self-pay | Admitting: Cardiovascular Disease

## 2018-07-28 ENCOUNTER — Other Ambulatory Visit: Payer: Self-pay | Admitting: Endocrinology

## 2018-07-28 ENCOUNTER — Encounter (HOSPITAL_COMMUNITY): Payer: Self-pay

## 2018-07-30 ENCOUNTER — Encounter (HOSPITAL_COMMUNITY): Payer: Self-pay

## 2018-08-01 ENCOUNTER — Other Ambulatory Visit: Payer: Self-pay | Admitting: Endocrinology

## 2018-08-01 ENCOUNTER — Encounter (HOSPITAL_COMMUNITY)
Admission: RE | Admit: 2018-08-01 | Discharge: 2018-08-01 | Disposition: A | Discharge status: Home / Self Care | Payer: Self-pay | Source: Ambulatory Visit | Attending: Cardiovascular Disease | Admitting: Cardiovascular Disease

## 2018-08-04 ENCOUNTER — Encounter (HOSPITAL_COMMUNITY)
Admission: RE | Admit: 2018-08-04 | Discharge: 2018-08-04 | Disposition: A | Discharge status: Home / Self Care | Payer: Self-pay | Source: Ambulatory Visit | Attending: Cardiovascular Disease | Admitting: Cardiovascular Disease

## 2018-08-06 ENCOUNTER — Encounter (HOSPITAL_COMMUNITY): Payer: Self-pay

## 2018-08-07 DIAGNOSIS — E119 Type 2 diabetes mellitus without complications: Secondary | ICD-10-CM | POA: Diagnosis not present

## 2018-08-07 DIAGNOSIS — I10 Essential (primary) hypertension: Secondary | ICD-10-CM | POA: Diagnosis not present

## 2018-08-07 DIAGNOSIS — Z9884 Bariatric surgery status: Secondary | ICD-10-CM | POA: Diagnosis not present

## 2018-08-07 DIAGNOSIS — E669 Obesity, unspecified: Secondary | ICD-10-CM | POA: Diagnosis not present

## 2018-08-07 DIAGNOSIS — J449 Chronic obstructive pulmonary disease, unspecified: Secondary | ICD-10-CM | POA: Diagnosis not present

## 2018-08-08 ENCOUNTER — Encounter (HOSPITAL_COMMUNITY): Payer: Self-pay

## 2018-08-11 ENCOUNTER — Encounter (HOSPITAL_COMMUNITY): Payer: Self-pay

## 2018-08-13 ENCOUNTER — Encounter (HOSPITAL_COMMUNITY)
Admission: RE | Admit: 2018-08-13 | Discharge: 2018-08-13 | Disposition: A | Discharge status: Home / Self Care | Payer: Self-pay | Source: Ambulatory Visit | Attending: Cardiovascular Disease | Admitting: Cardiovascular Disease

## 2018-08-14 DIAGNOSIS — E119 Type 2 diabetes mellitus without complications: Secondary | ICD-10-CM | POA: Diagnosis not present

## 2018-08-15 ENCOUNTER — Encounter (HOSPITAL_COMMUNITY)
Admission: RE | Admit: 2018-08-15 | Discharge: 2018-08-15 | Disposition: A | Discharge status: Home / Self Care | Payer: Self-pay | Source: Ambulatory Visit | Attending: Cardiovascular Disease | Admitting: Cardiovascular Disease

## 2018-08-18 ENCOUNTER — Encounter (HOSPITAL_COMMUNITY)
Admission: RE | Admit: 2018-08-18 | Discharge: 2018-08-18 | Disposition: A | Discharge status: Home / Self Care | Payer: Self-pay | Source: Ambulatory Visit | Attending: Cardiovascular Disease | Admitting: Cardiovascular Disease

## 2018-08-20 ENCOUNTER — Encounter (HOSPITAL_COMMUNITY)
Admission: RE | Admit: 2018-08-20 | Discharge: 2018-08-20 | Disposition: A | Discharge status: Home / Self Care | Payer: Self-pay | Source: Ambulatory Visit | Attending: Cardiovascular Disease | Admitting: Cardiovascular Disease

## 2018-08-21 DIAGNOSIS — N2581 Secondary hyperparathyroidism of renal origin: Secondary | ICD-10-CM | POA: Diagnosis not present

## 2018-08-21 DIAGNOSIS — I10 Essential (primary) hypertension: Secondary | ICD-10-CM | POA: Diagnosis not present

## 2018-08-21 DIAGNOSIS — E669 Obesity, unspecified: Secondary | ICD-10-CM | POA: Diagnosis not present

## 2018-08-21 DIAGNOSIS — I2510 Atherosclerotic heart disease of native coronary artery without angina pectoris: Secondary | ICD-10-CM | POA: Diagnosis not present

## 2018-08-21 DIAGNOSIS — J189 Pneumonia, unspecified organism: Secondary | ICD-10-CM | POA: Diagnosis not present

## 2018-08-21 DIAGNOSIS — N183 Chronic kidney disease, stage 3 (moderate): Secondary | ICD-10-CM | POA: Diagnosis not present

## 2018-08-22 ENCOUNTER — Encounter (HOSPITAL_COMMUNITY)
Admission: RE | Admit: 2018-08-22 | Discharge: 2018-08-22 | Disposition: A | Discharge status: Home / Self Care | Payer: Self-pay | Source: Ambulatory Visit | Attending: Cardiovascular Disease | Admitting: Cardiovascular Disease

## 2018-08-24 ENCOUNTER — Other Ambulatory Visit: Payer: Self-pay | Admitting: Endocrinology

## 2018-08-24 ENCOUNTER — Other Ambulatory Visit: Payer: Self-pay | Admitting: Physician Assistant

## 2018-08-24 ENCOUNTER — Other Ambulatory Visit: Payer: Self-pay | Admitting: Cardiovascular Disease

## 2018-08-27 ENCOUNTER — Encounter (HOSPITAL_COMMUNITY)
Admission: RE | Admit: 2018-08-27 | Discharge: 2018-08-27 | Disposition: A | Discharge status: Home / Self Care | Payer: Self-pay | Source: Ambulatory Visit | Attending: Cardiovascular Disease | Admitting: Cardiovascular Disease

## 2018-08-27 DIAGNOSIS — I2510 Atherosclerotic heart disease of native coronary artery without angina pectoris: Secondary | ICD-10-CM | POA: Insufficient documentation

## 2018-08-29 ENCOUNTER — Encounter (HOSPITAL_COMMUNITY)
Admission: RE | Admit: 2018-08-29 | Discharge: 2018-08-29 | Disposition: A | Discharge status: Home / Self Care | Payer: Self-pay | Source: Ambulatory Visit | Attending: Cardiovascular Disease | Admitting: Cardiovascular Disease

## 2018-08-29 ENCOUNTER — Encounter: Payer: Self-pay | Admitting: Endocrinology

## 2018-08-29 ENCOUNTER — Ambulatory Visit (INDEPENDENT_AMBULATORY_CARE_PROVIDER_SITE_OTHER): Payer: Medicare Other | Admitting: Endocrinology

## 2018-08-29 VITALS — BP 102/60 | HR 69 | Ht 63.0 in | Wt 211.0 lb

## 2018-08-29 DIAGNOSIS — E1151 Type 2 diabetes mellitus with diabetic peripheral angiopathy without gangrene: Secondary | ICD-10-CM | POA: Diagnosis not present

## 2018-08-29 DIAGNOSIS — D509 Iron deficiency anemia, unspecified: Secondary | ICD-10-CM

## 2018-08-29 DIAGNOSIS — Z125 Encounter for screening for malignant neoplasm of prostate: Secondary | ICD-10-CM | POA: Diagnosis not present

## 2018-08-29 DIAGNOSIS — M1A9XX0 Chronic gout, unspecified, without tophus (tophi): Secondary | ICD-10-CM | POA: Diagnosis not present

## 2018-08-29 DIAGNOSIS — I255 Ischemic cardiomyopathy: Secondary | ICD-10-CM

## 2018-08-29 DIAGNOSIS — Z794 Long term (current) use of insulin: Secondary | ICD-10-CM

## 2018-08-29 DIAGNOSIS — Z23 Encounter for immunization: Secondary | ICD-10-CM | POA: Diagnosis not present

## 2018-08-29 LAB — LIPID PANEL
CHOL/HDL RATIO: 3
Cholesterol: 127 mg/dL (ref 0–200)
HDL: 41.1 mg/dL (ref >39.00)
LDL Cholesterol: 66 mg/dL (ref 0–99)
NonHDL: 85.8
TRIGLYCERIDES: 99 mg/dL (ref 0.0–149.0)
VLDL: 19.8 mg/dL (ref 0.0–40.0)

## 2018-08-29 LAB — URINALYSIS, ROUTINE W REFLEX MICROSCOPIC
BILIRUBIN URINE: NEGATIVE
HGB URINE DIPSTICK: NEGATIVE
Ketones, ur: NEGATIVE
LEUKOCYTES UA: NEGATIVE
NITRITE: NEGATIVE
PH: 6 (ref 5.0–8.0)
RBC / HPF: NONE SEEN (ref 0–?)
Specific Gravity, Urine: <=1.005 — AB (ref 1.000–1.030)
UROBILINOGEN UA: 0.2 (ref 0.0–1.0)
Urine Glucose: NEGATIVE

## 2018-08-29 LAB — CBC WITH DIFFERENTIAL/PLATELET
BASOS PCT: 1 % (ref 0.0–3.0)
Basophils Absolute: 0.1 10*3/uL (ref 0.0–0.1)
EOS ABS: 0.3 10*3/uL (ref 0.0–0.7)
Eosinophils Relative: 3.3 % (ref 0.0–5.0)
HEMATOCRIT: 36.8 % — AB (ref 39.0–52.0)
HEMOGLOBIN: 12.3 g/dL — AB (ref 13.0–17.0)
LYMPHS PCT: 14.2 % (ref 12.0–46.0)
Lymphs Abs: 1.2 10*3/uL (ref 0.7–4.0)
MCHC: 33.5 g/dL (ref 30.0–36.0)
MCV: 89.2 fl (ref 78.0–100.0)
Monocytes Absolute: 0.7 10*3/uL (ref 0.1–1.0)
Monocytes Relative: 8 % (ref 3.0–12.0)
Neutro Abs: 6.5 10*3/uL (ref 1.4–7.7)
Neutrophils Relative %: 73.5 % (ref 43.0–77.0)
Platelets: 256 10*3/uL (ref 150.0–400.0)
RBC: 4.12 Mil/uL — AB (ref 4.22–5.81)
RDW: 14.6 % (ref 11.5–15.5)
WBC: 8.8 10*3/uL (ref 4.0–10.5)

## 2018-08-29 LAB — HEPATIC FUNCTION PANEL
ALK PHOS: 69 U/L (ref 39–117)
ALT: 13 U/L (ref 0–53)
AST: 12 U/L (ref 0–37)
Albumin: 3.4 g/dL — ABNORMAL LOW (ref 3.5–5.2)
BILIRUBIN DIRECT: 0.1 mg/dL (ref 0.0–0.3)
BILIRUBIN TOTAL: 0.4 mg/dL (ref 0.2–1.2)
TOTAL PROTEIN: 5.8 g/dL — AB (ref 6.0–8.3)

## 2018-08-29 LAB — BASIC METABOLIC PANEL
BUN: 44 mg/dL — AB (ref 6–23)
CHLORIDE: 96 meq/L (ref 96–112)
CO2: 34 meq/L — AB (ref 19–32)
CREATININE: 1.66 mg/dL — AB (ref 0.40–1.50)
Calcium: 9.2 mg/dL (ref 8.4–10.5)
GFR: 42.99 mL/min — ABNORMAL LOW (ref >60.00)
GLUCOSE: 104 mg/dL — AB (ref 70–99)
Potassium: 4.4 mEq/L (ref 3.5–5.1)
Sodium: 136 mEq/L (ref 135–145)

## 2018-08-29 LAB — IBC PANEL
Iron: 38 ug/dL — ABNORMAL LOW (ref 42–165)
SATURATION RATIOS: 12.6 % — AB (ref 20.0–50.0)
TRANSFERRIN: 215 mg/dL (ref 212.0–360.0)

## 2018-08-29 LAB — POCT GLYCOSYLATED HEMOGLOBIN (HGB A1C): HEMOGLOBIN A1C: 6.7 % — AB (ref 4.0–5.6)

## 2018-08-29 LAB — URIC ACID: Uric Acid, Serum: 4.4 mg/dL (ref 4.0–7.8)

## 2018-08-29 LAB — TSH: TSH: 2.1 u[IU]/mL (ref 0.35–4.50)

## 2018-08-29 LAB — PSA, MEDICARE: PSA: 0.75 ng/ml (ref 0.10–4.00)

## 2018-08-29 LAB — VITAMIN D 25 HYDROXY (VIT D DEFICIENCY, FRACTURES): VITD: 35.8 ng/mL (ref 30.00–100.00)

## 2018-08-29 MED ORDER — ISOSORBIDE MONONITRATE ER 60 MG PO TB24: 60.0000 mg | ORAL_TABLET | Freq: Every day | ORAL | 3 refills | Status: DC

## 2018-08-29 MED ORDER — ROSUVASTATIN CALCIUM 10 MG PO TABS: 10.0000 mg | ORAL_TABLET | Freq: Every day | ORAL | 3 refills | Status: DC

## 2018-08-29 MED ORDER — CITALOPRAM HYDROBROMIDE 10 MG PO TABS: ORAL_TABLET | ORAL | 3 refills | Status: DC

## 2018-08-29 MED ORDER — FINASTERIDE 5 MG PO TABS: 5.0000 mg | ORAL_TABLET | Freq: Every day | ORAL | 3 refills | Status: DC

## 2018-08-29 MED ORDER — CALCITRIOL 0.25 MCG PO CAPS: 0.2500 ug | ORAL_CAPSULE | Freq: Every day | ORAL | 3 refills | Status: DC

## 2018-08-29 MED ORDER — POTASSIUM CHLORIDE ER 10 MEQ PO TBCR: 10.0000 meq | EXTENDED_RELEASE_TABLET | Freq: Every day | ORAL | 3 refills | Status: DC

## 2018-08-29 MED ORDER — TRAZODONE HCL 100 MG PO TABS: ORAL_TABLET | ORAL | 3 refills | Status: DC

## 2018-08-29 MED ORDER — PANTOPRAZOLE SODIUM 40 MG PO TBEC: 40.0000 mg | DELAYED_RELEASE_TABLET | Freq: Every day | ORAL | 3 refills | Status: DC

## 2018-08-29 NOTE — Patient Instructions (Addendum)
Please continue the same medications blood tests are requested for you today.  We'll let you know about the results. check your blood sugar twice a day.  vary the time of day when you check, between before the 3 meals, and at bedtime.  also check if you have symptoms of your blood sugar being too high or too low.  please keep a record of the readings and bring it to your next appointment here (or you can bring the meter itself).  You can write it on any piece of paper.  please call us sooner if your blood sugar goes below 70, or if you have a lot of readings over 200. Please come back for a follow-up appointment in 4-5 months. 
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
## 2018-09-01 ENCOUNTER — Encounter: Payer: Self-pay | Admitting: Cardiovascular Disease

## 2018-09-01 ENCOUNTER — Encounter (HOSPITAL_COMMUNITY): Payer: Self-pay

## 2018-09-02 DIAGNOSIS — I517 Cardiomegaly: Secondary | ICD-10-CM | POA: Diagnosis not present

## 2018-09-02 DIAGNOSIS — R197 Diarrhea, unspecified: Secondary | ICD-10-CM | POA: Diagnosis not present

## 2018-09-02 DIAGNOSIS — R0602 Shortness of breath: Secondary | ICD-10-CM | POA: Diagnosis not present

## 2018-09-02 DIAGNOSIS — J189 Pneumonia, unspecified organism: Secondary | ICD-10-CM | POA: Diagnosis not present

## 2018-09-02 DIAGNOSIS — R05 Cough: Secondary | ICD-10-CM | POA: Diagnosis not present

## 2018-09-02 DIAGNOSIS — J9811 Atelectasis: Secondary | ICD-10-CM | POA: Diagnosis not present

## 2018-09-02 DIAGNOSIS — M7918 Myalgia, other site: Secondary | ICD-10-CM | POA: Diagnosis not present

## 2018-09-02 LAB — PTH, INTACT AND CALCIUM
CALCIUM: 9.4 mg/dL (ref 8.6–10.3)
PTH: 61 pg/mL (ref 14–64)

## 2018-09-03 ENCOUNTER — Telehealth: Payer: Self-pay | Admitting: Emergency Medicine

## 2018-09-03 ENCOUNTER — Encounter (HOSPITAL_COMMUNITY): Payer: Self-pay

## 2018-09-03 NOTE — Telephone Encounter (Signed)
Called and spoke with patient, he stated that he has been in [place] and was in the hospital there. Patient was diagnosed with pneumonia.  He is wanting to know if he still needs to have the CT done on 9.17.19 or if he needs to schedule this for another time.   RB please advise

## 2018-09-03 NOTE — Telephone Encounter (Signed)
Called patient unable to reach left message to give us a call back.

## 2018-09-03 NOTE — Telephone Encounter (Signed)
Patient called back stating he gave [place] incorrect telephone number.  CB is [phone].

## 2018-09-04 NOTE — Telephone Encounter (Signed)
Attempted to call the pt regarding results, VM is full at this time. X1

## 2018-09-04 NOTE — Telephone Encounter (Signed)
Please let him know that I would like to postpone the CT scan for 2 months to let him completely recover from PNA before imaging.

## 2018-09-05 ENCOUNTER — Encounter (HOSPITAL_COMMUNITY): Payer: Self-pay

## 2018-09-05 NOTE — Telephone Encounter (Signed)
I called pt but there was no answer and VM was full. Unable to leave message, will try again later.

## 2018-09-08 ENCOUNTER — Encounter (HOSPITAL_COMMUNITY): Payer: Self-pay

## 2018-09-09 ENCOUNTER — Ambulatory Visit (INDEPENDENT_AMBULATORY_CARE_PROVIDER_SITE_OTHER)
Admission: RE | Admit: 2018-09-09 | Discharge: 2018-09-09 | Disposition: A | Discharge status: Home / Self Care | Payer: Medicare Other | Source: Ambulatory Visit | Attending: Emergency Medicine | Admitting: Emergency Medicine

## 2018-09-09 DIAGNOSIS — J439 Emphysema, unspecified: Secondary | ICD-10-CM | POA: Diagnosis not present

## 2018-09-09 DIAGNOSIS — J90 Pleural effusion, not elsewhere classified: Secondary | ICD-10-CM | POA: Diagnosis not present

## 2018-09-09 NOTE — Telephone Encounter (Signed)
I called pt but there was no answer. Unable to leave VM due to mailbox being full.

## 2018-09-10 ENCOUNTER — Telehealth (HOSPITAL_COMMUNITY): Payer: Self-pay | Admitting: Endocrinology

## 2018-09-10 ENCOUNTER — Encounter (HOSPITAL_COMMUNITY): Payer: Self-pay

## 2018-09-10 ENCOUNTER — Other Ambulatory Visit: Payer: Self-pay | Admitting: Endocrinology

## 2018-09-10 NOTE — Telephone Encounter (Signed)
Spoke with the pt  I advised that [name] had wanted him to postpone the ct chest for 2 mo  Unfortunately, we could not get him on the phone before hand to tell him this and he went ahead and had the ct  He is asking for results  Please advise thanks

## 2018-09-11 DIAGNOSIS — N183 Chronic kidney disease, stage 3 (moderate): Secondary | ICD-10-CM | POA: Diagnosis not present

## 2018-09-11 NOTE — Telephone Encounter (Signed)
Called and spoke with patient he is aware and verbalized understanding. Nothing further needed.  

## 2018-09-11 NOTE — Telephone Encounter (Signed)
Please let him know that his prior areas of scarring have not changed.  I can see some evidence for resolving right-sided pneumonia that is likely leftover from his recent hospitalization.  No other new worrisome findings.

## 2018-09-12 ENCOUNTER — Encounter (HOSPITAL_COMMUNITY): Payer: Self-pay

## 2018-09-15 ENCOUNTER — Encounter (HOSPITAL_COMMUNITY): Payer: Self-pay

## 2018-09-15 ENCOUNTER — Ambulatory Visit (INDEPENDENT_AMBULATORY_CARE_PROVIDER_SITE_OTHER): Payer: Medicare Other | Admitting: Cardiovascular Disease

## 2018-09-15 ENCOUNTER — Encounter: Payer: Self-pay | Admitting: Cardiovascular Disease

## 2018-09-15 ENCOUNTER — Telehealth: Payer: Self-pay | Admitting: Emergency Medicine

## 2018-09-15 VITALS — BP 108/58 | HR 67 | Ht 63.0 in | Wt 211.4 lb

## 2018-09-15 DIAGNOSIS — E7800 Pure hypercholesterolemia, unspecified: Secondary | ICD-10-CM | POA: Diagnosis not present

## 2018-09-15 DIAGNOSIS — I10 Essential (primary) hypertension: Secondary | ICD-10-CM

## 2018-09-15 DIAGNOSIS — I255 Ischemic cardiomyopathy: Secondary | ICD-10-CM | POA: Diagnosis not present

## 2018-09-15 DIAGNOSIS — I5042 Chronic combined systolic (congestive) and diastolic (congestive) heart failure: Secondary | ICD-10-CM | POA: Diagnosis not present

## 2018-09-15 DIAGNOSIS — I2510 Atherosclerotic heart disease of native coronary artery without angina pectoris: Secondary | ICD-10-CM | POA: Diagnosis not present

## 2018-09-15 MED ORDER — AZELASTINE HCL 0.1 % NA SOLN: 2.0000 | Freq: Two times a day (BID) | NASAL | 12 refills | Status: DC | PRN

## 2018-09-15 NOTE — Telephone Encounter (Signed)
Yes please send, 2 sprays each nostril 2-3x a day if needed for nasal drainage.

## 2018-09-15 NOTE — Telephone Encounter (Signed)
Medication sent, patient is aware nothing further needed.

## 2018-09-15 NOTE — Patient Instructions (Signed)
Medication Instructions:  Your physician recommends that you continue on your current medications as directed. Please refer to the Current Medication list given to you today.   Labwork: none  Testing/Procedures: none  Follow-Up: Your physician recommends that you schedule a follow-up appointment in: 12 months. Please call our office in about 8 months to schedule this appointment    Any Other Special Instructions Will Be Listed Below (If Applicable).     If you need a refill on your cardiac medications before your next appointment, please call your pharmacy.   

## 2018-09-15 NOTE — Telephone Encounter (Signed)
Called and spoke with patient he stated that he is needing a refill on medication Azelastine nose spray. Patient was getting this from his daughter for free but he now needs a prescription. RB can we send this in, thank you.
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
## 2018-09-17 ENCOUNTER — Encounter (HOSPITAL_COMMUNITY): Payer: Self-pay

## 2018-09-19 ENCOUNTER — Encounter (HOSPITAL_COMMUNITY): Payer: Self-pay

## 2018-09-22 ENCOUNTER — Encounter (HOSPITAL_COMMUNITY): Payer: Self-pay

## 2018-09-24 ENCOUNTER — Encounter (HOSPITAL_COMMUNITY): Payer: Self-pay

## 2018-09-24 DIAGNOSIS — I2510 Atherosclerotic heart disease of native coronary artery without angina pectoris: Secondary | ICD-10-CM | POA: Insufficient documentation

## 2018-09-26 ENCOUNTER — Encounter (HOSPITAL_COMMUNITY): Payer: Self-pay

## 2018-09-29 ENCOUNTER — Encounter (HOSPITAL_COMMUNITY): Payer: Self-pay

## 2018-10-01 ENCOUNTER — Encounter (HOSPITAL_COMMUNITY)
Admission: RE | Admit: 2018-10-01 | Discharge: 2018-10-01 | Disposition: A | Discharge status: Home / Self Care | Payer: Self-pay | Source: Ambulatory Visit | Attending: Cardiovascular Disease | Admitting: Cardiovascular Disease

## 2018-10-02 ENCOUNTER — Encounter: Payer: Medicare Other | Admitting: Family Medicine

## 2018-10-02 DIAGNOSIS — Z23 Encounter for immunization: Secondary | ICD-10-CM | POA: Diagnosis not present

## 2018-10-02 DIAGNOSIS — Z85828 Personal history of other malignant neoplasm of skin: Secondary | ICD-10-CM | POA: Diagnosis not present

## 2018-10-02 DIAGNOSIS — D225 Melanocytic nevi of trunk: Secondary | ICD-10-CM | POA: Diagnosis not present

## 2018-10-02 DIAGNOSIS — D485 Neoplasm of uncertain behavior of skin: Secondary | ICD-10-CM | POA: Diagnosis not present

## 2018-10-02 DIAGNOSIS — L821 Other seborrheic keratosis: Secondary | ICD-10-CM | POA: Diagnosis not present

## 2018-10-02 DIAGNOSIS — Z86018 Personal history of other benign neoplasm: Secondary | ICD-10-CM | POA: Diagnosis not present

## 2018-10-02 DIAGNOSIS — D2230 Melanocytic nevi of unspecified part of face: Secondary | ICD-10-CM | POA: Diagnosis not present

## 2018-10-02 DIAGNOSIS — L570 Actinic keratosis: Secondary | ICD-10-CM | POA: Diagnosis not present

## 2018-10-02 DIAGNOSIS — D0439 Carcinoma in situ of skin of other parts of face: Secondary | ICD-10-CM | POA: Diagnosis not present

## 2018-10-03 ENCOUNTER — Encounter (HOSPITAL_COMMUNITY): Payer: Self-pay

## 2018-10-06 ENCOUNTER — Encounter (HOSPITAL_COMMUNITY): Payer: Self-pay

## 2018-10-08 ENCOUNTER — Encounter (HOSPITAL_COMMUNITY)
Admission: RE | Admit: 2018-10-08 | Discharge: 2018-10-08 | Disposition: A | Discharge status: Home / Self Care | Payer: Medicare Other | Source: Ambulatory Visit | Attending: Cardiovascular Disease | Admitting: Cardiovascular Disease

## 2018-10-09 ENCOUNTER — Ambulatory Visit (INDEPENDENT_AMBULATORY_CARE_PROVIDER_SITE_OTHER): Payer: Medicare Other | Admitting: Family Medicine

## 2018-10-09 ENCOUNTER — Encounter: Payer: Self-pay | Admitting: Family Medicine

## 2018-10-09 VITALS — BP 142/80 | HR 70 | Temp 98.4°F | Ht 63.0 in | Wt 210.4 lb

## 2018-10-09 DIAGNOSIS — I5032 Chronic diastolic (congestive) heart failure: Secondary | ICD-10-CM | POA: Diagnosis not present

## 2018-10-09 DIAGNOSIS — E118 Type 2 diabetes mellitus with unspecified complications: Secondary | ICD-10-CM | POA: Diagnosis not present

## 2018-10-09 DIAGNOSIS — I10 Essential (primary) hypertension: Secondary | ICD-10-CM | POA: Diagnosis not present

## 2018-10-09 DIAGNOSIS — J301 Allergic rhinitis due to pollen: Secondary | ICD-10-CM

## 2018-10-09 DIAGNOSIS — J439 Emphysema, unspecified: Secondary | ICD-10-CM

## 2018-10-09 DIAGNOSIS — Z7689 Persons encountering health services in other specified circumstances: Secondary | ICD-10-CM | POA: Diagnosis not present
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
## 2018-10-10 ENCOUNTER — Encounter (HOSPITAL_COMMUNITY): Payer: Self-pay

## 2018-10-13 ENCOUNTER — Encounter (HOSPITAL_COMMUNITY)
Admission: RE | Admit: 2018-10-13 | Discharge: 2018-10-13 | Disposition: A | Discharge status: Home / Self Care | Payer: Self-pay | Source: Ambulatory Visit | Attending: Cardiovascular Disease | Admitting: Cardiovascular Disease

## 2018-10-15 ENCOUNTER — Encounter (HOSPITAL_COMMUNITY)
Admission: RE | Admit: 2018-10-15 | Discharge: 2018-10-15 | Disposition: A | Discharge status: Home / Self Care | Payer: Self-pay | Source: Ambulatory Visit | Attending: Cardiovascular Disease | Admitting: Cardiovascular Disease

## 2018-10-17 ENCOUNTER — Encounter (HOSPITAL_COMMUNITY)
Admission: RE | Admit: 2018-10-17 | Discharge: 2018-10-17 | Disposition: A | Discharge status: Home / Self Care | Payer: Self-pay | Source: Ambulatory Visit | Attending: Cardiovascular Disease | Admitting: Cardiovascular Disease

## 2018-10-20 ENCOUNTER — Encounter (HOSPITAL_COMMUNITY): Payer: Self-pay

## 2018-10-22 ENCOUNTER — Encounter (HOSPITAL_COMMUNITY): Payer: Self-pay

## 2018-10-23 ENCOUNTER — Encounter: Payer: Self-pay | Admitting: Family Medicine

## 2018-10-23 ENCOUNTER — Ambulatory Visit (INDEPENDENT_AMBULATORY_CARE_PROVIDER_SITE_OTHER): Payer: Medicare Other | Admitting: Family Medicine

## 2018-10-23 VITALS — BP 118/62 | Temp 97.8°F | Ht 63.0 in | Wt 207.8 lb

## 2018-10-23 DIAGNOSIS — I10 Essential (primary) hypertension: Secondary | ICD-10-CM | POA: Diagnosis not present

## 2018-10-23 DIAGNOSIS — E669 Obesity, unspecified: Secondary | ICD-10-CM | POA: Diagnosis not present

## 2018-10-23 DIAGNOSIS — Z9884 Bariatric surgery status: Secondary | ICD-10-CM | POA: Diagnosis not present

## 2018-10-23 DIAGNOSIS — E1159 Type 2 diabetes mellitus with other circulatory complications: Secondary | ICD-10-CM | POA: Diagnosis not present

## 2018-10-23 DIAGNOSIS — J449 Chronic obstructive pulmonary disease, unspecified: Secondary | ICD-10-CM | POA: Diagnosis not present

## 2018-10-23 DIAGNOSIS — H6123 Impacted cerumen, bilateral: Secondary | ICD-10-CM

## 2018-10-23 DIAGNOSIS — E1169 Type 2 diabetes mellitus with other specified complication: Secondary | ICD-10-CM | POA: Diagnosis not present

## 2018-10-23 NOTE — Patient Instructions (Addendum)
Use OTC Debrox otic solution 2x/day for 3-5 days Follow-up in 6 days (on Wed 11/6) so we can re-evaluate your Lt ear  Earwax Buildup, Adult The ears produce a substance called earwax that helps keep bacteria out of the ear and protects the skin in the ear canal. Occasionally, earwax can build up in the ear and cause discomfort or hearing loss. What increases the risk? This condition is more likely to develop in people who:  Are male.  Are elderly.  Naturally produce more earwax.  Clean their ears often with cotton swabs.  Use earplugs often.  Use in-ear headphones often.  Wear hearing aids.  Have narrow ear canals.  Have earwax that is overly thick or sticky.  Have eczema.  Are dehydrated.  Have excess hair in the ear canal.  What are the signs or symptoms? Symptoms of this condition include:  Reduced or muffled hearing.  A feeling of fullness in the ear or feeling that the ear is plugged.  Fluid coming from the ear.  Ear pain.  Ear itch.  Ringing in the ear.  Coughing.  An obvious piece of earwax that can be seen inside the ear canal.  How is this diagnosed? This condition may be diagnosed based on:  Your symptoms.  Your medical history.  An ear exam. During the exam, your health care provider will look into your ear with an instrument called an otoscope.  You may have tests, including a hearing test. How is this treated? This condition may be treated by:  Using ear drops to soften the earwax.  Having the earwax removed by a health care provider. The health care provider may: ? Flush the ear with water. ? Use an instrument that has a loop on the end (curette). ? Use a suction device.  Surgery to remove the wax buildup. This may be done in severe cases.  Follow these instructions at home:  Take over-the-counter and prescription medicines only as told by your health care provider.  Do not put any objects, including cotton swabs, into your  ear. You can clean the opening of your ear canal with a washcloth or facial tissue.  Follow instructions from your health care provider about cleaning your ears. Do not over-clean your ears.  Drink enough fluid to keep your urine clear or pale yellow. This will help to thin the earwax.  Keep all follow-up visits as told by your health care provider. If earwax builds up in your ears often or if you use hearing aids, consider seeing your health care provider for routine, preventive ear cleanings. Ask your health care provider how often you should schedule your cleanings.  If you have hearing aids, clean them according to instructions from the manufacturer and your health care provider. Contact a health care provider if:  You have ear pain.  You develop a fever.  You have blood, pus, or other fluid coming from your ear.  You have hearing loss.  You have ringing in your ears that does not go away.  Your symptoms do not improve with treatment.  You feel like the room is spinning (vertigo). Summary  Earwax can build up in the ear and cause discomfort or hearing loss.  The most common symptoms of this condition include reduced or muffled hearing and a feeling of fullness in the ear or feeling that the ear is plugged.  This condition may be diagnosed based on your symptoms, your medical history, and an ear exam.  This condition  may be treated by using ear drops to soften the earwax or by having the earwax removed by a health care provider.  Do not put any objects, including cotton swabs, into your ear. You can clean the opening of your ear canal with a washcloth or facial tissue. This information is not intended to replace advice given to you by your health care provider. Make sure you discuss any questions you have with your health care provider. Document Released: 01/17/2005 Document Revised: 02/20/2017 Document Reviewed: 02/20/2017 Elsevier Interactive Patient Education  [employer].
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
## 2018-10-24 ENCOUNTER — Encounter (HOSPITAL_COMMUNITY): Payer: Self-pay

## 2018-10-24 DIAGNOSIS — I2510 Atherosclerotic heart disease of native coronary artery without angina pectoris: Secondary | ICD-10-CM | POA: Insufficient documentation

## 2018-10-27 ENCOUNTER — Encounter (HOSPITAL_COMMUNITY)
Admission: RE | Admit: 2018-10-27 | Discharge: 2018-10-27 | Disposition: A | Discharge status: Home / Self Care | Payer: Self-pay | Source: Ambulatory Visit | Attending: Cardiovascular Disease | Admitting: Cardiovascular Disease

## 2018-10-29 ENCOUNTER — Encounter (HOSPITAL_COMMUNITY): Payer: Self-pay

## 2018-10-30 ENCOUNTER — Encounter: Payer: Self-pay | Admitting: Family Medicine

## 2018-10-30 ENCOUNTER — Ambulatory Visit (INDEPENDENT_AMBULATORY_CARE_PROVIDER_SITE_OTHER): Payer: Medicare Other | Admitting: Family Medicine

## 2018-10-30 VITALS — BP 154/78 | HR 77 | Ht 63.0 in

## 2018-10-30 DIAGNOSIS — H6122 Impacted cerumen, left ear: Secondary | ICD-10-CM
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
## 2018-10-31 ENCOUNTER — Encounter (HOSPITAL_COMMUNITY)
Admission: RE | Admit: 2018-10-31 | Discharge: 2018-10-31 | Disposition: A | Discharge status: Home / Self Care | Payer: Medicare Other | Source: Ambulatory Visit | Attending: Cardiovascular Disease | Admitting: Cardiovascular Disease

## 2018-11-03 ENCOUNTER — Encounter (HOSPITAL_COMMUNITY): Payer: Self-pay

## 2018-11-05 ENCOUNTER — Encounter (HOSPITAL_COMMUNITY): Payer: Self-pay

## 2018-11-05 DIAGNOSIS — D0439 Carcinoma in situ of skin of other parts of face: Secondary | ICD-10-CM | POA: Diagnosis not present

## 2018-11-07 ENCOUNTER — Encounter (HOSPITAL_COMMUNITY): Payer: Self-pay

## 2018-11-10 ENCOUNTER — Encounter (HOSPITAL_COMMUNITY): Payer: Self-pay

## 2018-11-12 ENCOUNTER — Encounter (HOSPITAL_COMMUNITY): Payer: Self-pay

## 2018-11-13 ENCOUNTER — Ambulatory Visit: Payer: Medicare Other | Admitting: Nurse Practitioner

## 2018-11-14 ENCOUNTER — Encounter (HOSPITAL_COMMUNITY): Payer: Self-pay

## 2018-11-17 ENCOUNTER — Encounter (HOSPITAL_COMMUNITY): Payer: Self-pay

## 2018-11-19 ENCOUNTER — Encounter (HOSPITAL_COMMUNITY): Payer: Self-pay

## 2018-11-24 ENCOUNTER — Encounter (HOSPITAL_COMMUNITY): Payer: Self-pay | Attending: Cardiovascular Disease

## 2018-11-24 DIAGNOSIS — I2510 Atherosclerotic heart disease of native coronary artery without angina pectoris: Secondary | ICD-10-CM | POA: Insufficient documentation

## 2018-11-25 ENCOUNTER — Other Ambulatory Visit: Payer: Self-pay | Admitting: Cardiovascular Disease

## 2018-11-26 ENCOUNTER — Encounter (HOSPITAL_COMMUNITY): Payer: Self-pay

## 2018-11-28 ENCOUNTER — Encounter (HOSPITAL_COMMUNITY): Payer: Self-pay

## 2018-12-01 ENCOUNTER — Encounter (HOSPITAL_COMMUNITY): Payer: Self-pay

## 2018-12-02 ENCOUNTER — Other Ambulatory Visit: Payer: Self-pay | Admitting: Cardiovascular Disease

## 2018-12-03 ENCOUNTER — Encounter (HOSPITAL_COMMUNITY): Payer: Self-pay

## 2018-12-05 ENCOUNTER — Other Ambulatory Visit: Payer: Self-pay

## 2018-12-05 ENCOUNTER — Encounter (HOSPITAL_COMMUNITY): Payer: Self-pay

## 2018-12-05 MED ORDER — FUROSEMIDE 40 MG PO TABS: 80.0000 mg | ORAL_TABLET | Freq: Two times a day (BID) | ORAL | 1 refills | Status: DC

## 2018-12-08 ENCOUNTER — Encounter (HOSPITAL_COMMUNITY): Payer: Self-pay

## 2018-12-10 ENCOUNTER — Encounter (HOSPITAL_COMMUNITY): Payer: Self-pay

## 2018-12-12 ENCOUNTER — Encounter (HOSPITAL_COMMUNITY): Payer: Self-pay

## 2018-12-15 ENCOUNTER — Encounter (HOSPITAL_COMMUNITY): Payer: Self-pay

## 2018-12-19 ENCOUNTER — Encounter (HOSPITAL_COMMUNITY): Payer: Self-pay

## 2018-12-22 ENCOUNTER — Encounter (HOSPITAL_COMMUNITY): Payer: Self-pay

## 2018-12-26 ENCOUNTER — Encounter (HOSPITAL_COMMUNITY): Payer: Self-pay | Attending: Cardiovascular Disease

## 2018-12-26 DIAGNOSIS — I2510 Atherosclerotic heart disease of native coronary artery without angina pectoris: Secondary | ICD-10-CM | POA: Insufficient documentation

## 2018-12-29 ENCOUNTER — Encounter (HOSPITAL_COMMUNITY): Payer: Self-pay

## 2018-12-31 ENCOUNTER — Encounter (HOSPITAL_COMMUNITY): Payer: Self-pay

## 2019-01-02 ENCOUNTER — Encounter (HOSPITAL_COMMUNITY): Payer: Self-pay

## 2019-01-05 ENCOUNTER — Encounter (HOSPITAL_COMMUNITY): Payer: Self-pay

## 2019-01-07 ENCOUNTER — Encounter (HOSPITAL_COMMUNITY): Payer: Self-pay

## 2019-01-09 ENCOUNTER — Encounter (HOSPITAL_COMMUNITY): Payer: Self-pay

## 2019-01-12 ENCOUNTER — Encounter (HOSPITAL_COMMUNITY): Payer: Self-pay

## 2019-01-14 ENCOUNTER — Encounter (HOSPITAL_COMMUNITY): Payer: Self-pay

## 2019-01-16 ENCOUNTER — Encounter (HOSPITAL_COMMUNITY): Payer: Self-pay

## 2019-01-19 ENCOUNTER — Encounter (HOSPITAL_COMMUNITY): Payer: Self-pay

## 2019-01-21 ENCOUNTER — Encounter (HOSPITAL_COMMUNITY): Payer: Self-pay

## 2019-01-23 ENCOUNTER — Telehealth (HOSPITAL_COMMUNITY): Payer: Self-pay | Admitting: *Deleted

## 2019-01-23 ENCOUNTER — Encounter (HOSPITAL_COMMUNITY): Payer: Self-pay

## 2019-01-23 DIAGNOSIS — J449 Chronic obstructive pulmonary disease, unspecified: Secondary | ICD-10-CM | POA: Diagnosis not present

## 2019-01-23 DIAGNOSIS — E669 Obesity, unspecified: Secondary | ICD-10-CM | POA: Diagnosis not present

## 2019-01-23 DIAGNOSIS — I10 Essential (primary) hypertension: Secondary | ICD-10-CM | POA: Diagnosis not present

## 2019-01-23 DIAGNOSIS — Z9884 Bariatric surgery status: Secondary | ICD-10-CM | POA: Diagnosis not present

## 2019-01-23 DIAGNOSIS — E1159 Type 2 diabetes mellitus with other circulatory complications: Secondary | ICD-10-CM | POA: Diagnosis not present

## 2019-01-23 DIAGNOSIS — E1169 Type 2 diabetes mellitus with other specified complication: Secondary | ICD-10-CM | POA: Diagnosis not present

## 2019-01-23 NOTE — Telephone Encounter (Signed)
Spoke with patient today regarding abscence from the cardiac rehab maintenance program. 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 Pt states he intends to return to exercise, hopefully this coming Monday, 01/26/2019. Pt will call if his intention to remain in the program changes.

## 2019-01-25 ENCOUNTER — Other Ambulatory Visit: Payer: Self-pay | Admitting: Cardiovascular Disease

## 2019-01-28 ENCOUNTER — Encounter (HOSPITAL_COMMUNITY)
Admission: RE | Admit: 2019-01-28 | Discharge: 2019-01-28 | Disposition: A | Discharge status: Home / Self Care | Payer: Self-pay | Source: Ambulatory Visit | Attending: Cardiovascular Disease | Admitting: Cardiovascular Disease

## 2019-01-28 DIAGNOSIS — I2510 Atherosclerotic heart disease of native coronary artery without angina pectoris: Secondary | ICD-10-CM | POA: Insufficient documentation

## 2019-01-30 ENCOUNTER — Encounter (HOSPITAL_COMMUNITY)
Admission: RE | Admit: 2019-01-30 | Discharge: 2019-01-30 | Disposition: A | Discharge status: Home / Self Care | Payer: Self-pay | Source: Ambulatory Visit | Attending: Cardiovascular Disease | Admitting: Cardiovascular Disease

## 2019-02-02 ENCOUNTER — Encounter: Payer: Self-pay | Admitting: Endocrinology

## 2019-02-02 ENCOUNTER — Encounter (HOSPITAL_COMMUNITY)
Admission: RE | Admit: 2019-02-02 | Discharge: 2019-02-02 | Disposition: A | Discharge status: Home / Self Care | Payer: Self-pay | Source: Ambulatory Visit | Attending: Cardiovascular Disease | Admitting: Cardiovascular Disease

## 2019-02-02 ENCOUNTER — Ambulatory Visit (INDEPENDENT_AMBULATORY_CARE_PROVIDER_SITE_OTHER): Payer: Medicare Other | Admitting: Endocrinology

## 2019-02-02 ENCOUNTER — Other Ambulatory Visit: Payer: Self-pay

## 2019-02-02 VITALS — BP 129/78 | HR 63 | Temp 98.3°F | Resp 17 | Ht 63.0 in | Wt 219.2 lb

## 2019-02-02 DIAGNOSIS — E1151 Type 2 diabetes mellitus with diabetic peripheral angiopathy without gangrene: Secondary | ICD-10-CM | POA: Diagnosis not present

## 2019-02-02 DIAGNOSIS — Z794 Long term (current) use of insulin: Secondary | ICD-10-CM

## 2019-02-02 LAB — POCT GLYCOSYLATED HEMOGLOBIN (HGB A1C): Hemoglobin A1C: 6.9 % — AB (ref 4.0–5.6)
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
## 2019-02-02 NOTE — Patient Instructions (Signed)
Please continue the same medications blood tests are requested for you today.  We'll let you know about the results. check your blood sugar twice a day.  vary the time of day when you check, between before the 3 meals, and at bedtime.  also check if you have symptoms of your blood sugar being too high or too low.  please keep a record of the readings and bring it to your next appointment here (or you can bring the meter itself).  You can write it on any piece of paper.  please call us sooner if your blood sugar goes below 70, or if you have a lot of readings over 200. Please come back for a follow-up appointment in 4-5 months.

## 2019-02-04 ENCOUNTER — Encounter (HOSPITAL_COMMUNITY)
Admission: RE | Admit: 2019-02-04 | Discharge: 2019-02-04 | Disposition: A | Discharge status: Home / Self Care | Payer: Self-pay | Source: Ambulatory Visit | Attending: Cardiovascular Disease | Admitting: Cardiovascular Disease

## 2019-02-05 DIAGNOSIS — L570 Actinic keratosis: Secondary | ICD-10-CM | POA: Diagnosis not present

## 2019-02-06 ENCOUNTER — Encounter (HOSPITAL_COMMUNITY): Payer: Self-pay

## 2019-02-09 ENCOUNTER — Encounter (HOSPITAL_COMMUNITY)
Admission: RE | Admit: 2019-02-09 | Discharge: 2019-02-09 | Disposition: A | Discharge status: Home / Self Care | Payer: Self-pay | Source: Ambulatory Visit | Attending: Cardiovascular Disease | Admitting: Cardiovascular Disease

## 2019-02-11 ENCOUNTER — Encounter (HOSPITAL_COMMUNITY)
Admission: RE | Admit: 2019-02-11 | Discharge: 2019-02-11 | Disposition: A | Discharge status: Home / Self Care | Payer: Self-pay | Source: Ambulatory Visit | Attending: Cardiovascular Disease | Admitting: Cardiovascular Disease

## 2019-02-13 ENCOUNTER — Encounter (HOSPITAL_COMMUNITY): Payer: Self-pay

## 2019-02-16 ENCOUNTER — Encounter (HOSPITAL_COMMUNITY)
Admission: RE | Admit: 2019-02-16 | Discharge: 2019-02-16 | Disposition: A | Discharge status: Home / Self Care | Payer: Self-pay | Source: Ambulatory Visit | Attending: Cardiovascular Disease | Admitting: Cardiovascular Disease

## 2019-02-18 ENCOUNTER — Encounter (HOSPITAL_COMMUNITY)
Admission: RE | Admit: 2019-02-18 | Discharge: 2019-02-18 | Disposition: A | Discharge status: Home / Self Care | Payer: Self-pay | Source: Ambulatory Visit | Attending: Cardiovascular Disease | Admitting: Cardiovascular Disease

## 2019-02-20 ENCOUNTER — Encounter (HOSPITAL_COMMUNITY): Payer: Self-pay

## 2019-02-23 ENCOUNTER — Encounter (HOSPITAL_COMMUNITY)
Admission: RE | Admit: 2019-02-23 | Discharge: 2019-02-23 | Disposition: A | Discharge status: Home / Self Care | Payer: Self-pay | Source: Ambulatory Visit | Attending: Cardiovascular Disease | Admitting: Cardiovascular Disease

## 2019-02-23 DIAGNOSIS — I2510 Atherosclerotic heart disease of native coronary artery without angina pectoris: Secondary | ICD-10-CM | POA: Insufficient documentation

## 2019-02-25 ENCOUNTER — Encounter (HOSPITAL_COMMUNITY)
Admission: RE | Admit: 2019-02-25 | Discharge: 2019-02-25 | Disposition: A | Discharge status: Home / Self Care | Payer: Self-pay | Source: Ambulatory Visit | Attending: Cardiovascular Disease | Admitting: Cardiovascular Disease

## 2019-02-27 ENCOUNTER — Encounter (HOSPITAL_COMMUNITY)
Admission: RE | Admit: 2019-02-27 | Discharge: 2019-02-27 | Disposition: A | Discharge status: Home / Self Care | Payer: Self-pay | Source: Ambulatory Visit | Attending: Cardiovascular Disease | Admitting: Cardiovascular Disease

## 2019-03-02 ENCOUNTER — Encounter (HOSPITAL_COMMUNITY): Payer: Self-pay

## 2019-03-03 ENCOUNTER — Other Ambulatory Visit: Payer: Self-pay | Admitting: Endocrinology

## 2019-03-04 ENCOUNTER — Other Ambulatory Visit: Payer: Self-pay | Admitting: Family Medicine

## 2019-03-04 ENCOUNTER — Encounter (HOSPITAL_COMMUNITY): Payer: Self-pay

## 2019-03-04 NOTE — Telephone Encounter (Signed)
Requested medication (s) are due for refill today -yes  Requested medication (s) are on the active medication list -yes  Future visit scheduled -yes  Last refill: 09/10/18  Notes to clinic: patient is requesting medication that fails lab protocol- sent for provider review  Requested Prescriptions  Pending Prescriptions Disp Refills   allopurinol (ZYLOPRIM) 300 MG tablet 90 tablet 1    Sig: Take 1 tablet (300 mg total) by mouth daily.     Endocrinology:  Gout Agents Failed - 03/04/2019  3:38 PM      Failed - Cr in normal range and within 360 days    Creat  Date Value Ref Range Status  05/10/2014 1.35 0.50 - 1.35 mg/dL Final   Creatinine, Ser  Date Value Ref Range Status  08/29/2018 1.66 (H) 0.40 - 1.50 mg/dL Final         Passed - Uric Acid in normal range and within 360 days    Uric Acid, Serum  Date Value Ref Range Status  08/29/2018 4.4 4.0 - 7.8 mg/dL Final         Passed - Valid encounter within last 12 months    Recent Outpatient Visits          4 months ago Impacted cerumen of left ear   [facility] [place], [place], DO   4 months ago Bilateral impacted cerumen   [facility] [place], [place], DO   4 months ago Essential hypertension   [facility] [place], [place], DO   6 years ago Type I (juvenile type) diabetes mellitus with renal manifestations, not stated as uncontrolled   [place], [place], MD   6 years ago Type I (juvenile type) diabetes mellitus with renal manifestations, not stated as uncontrolled   [place], MD      Future Appointments            In 1 month Cirigliano, Garvin Fila, DO [facility] [place], [facility]            Requested Prescriptions  Pending Prescriptions Disp Refills   allopurinol (ZYLOPRIM) 300 MG tablet 90 tablet 1    Sig: Take 1 tablet (300 mg total) by mouth daily.     Endocrinology:  Gout Agents Failed - 03/04/2019  3:38 PM      Failed - Cr in normal range and within 360 days    Creat  Date Value Ref Range Status  05/10/2014 1.35 0.50 - 1.35 mg/dL Final   Creatinine, Ser  Date Value Ref Range Status  08/29/2018 1.66 (H) 0.40 - 1.50 mg/dL Final         Passed - Uric Acid in normal range and within 360 days    Uric Acid, Serum  Date Value Ref Range Status  08/29/2018 4.4 4.0 - 7.8 mg/dL Final         Passed - Valid encounter within last 12 months    Recent Outpatient Visits          4 months ago Impacted cerumen of left ear   [facility] [place], [place], DO   4 months ago Bilateral impacted cerumen   [facility] [place], [place] K, DO   4 months ago Essential hypertension   [facility] [place], [place] K, DO   6 years ago Type I (juvenile type) diabetes mellitus with renal manifestations, not stated as uncontrolled  [facility], [place] Clark, MD   6 years ago Type I (juvenile type) diabetes mellitus with renal manifestations, not stated as uncontrolled   [place], MD      Future Appointments            In 1 month Cirigliano, Garvin Fila, DO [facility] [place], [facility]

## 2019-03-04 NOTE — Telephone Encounter (Unsigned)
Copied from [place] [id number]. Topic: Quick Communication - Rx Refill/Question >> Mar 04, 2019  3:35 PM Parke Poisson wrote: Medication: allopurinol (ZYLOPRIM) 300 MG tablet  Has the patient contacted their pharmacy? Yes  (Agent: If yes, when and what did the pharmacy advise?) To contact PCP  Preferred Pharmacy (with phone number or street name): [facility], [place] [phone] (Phone) [phone] (Fax)    Agent: Please be advised that RX refills may take up to 3 business days. We ask that you follow-up with your pharmacy.

## 2019-03-05 MED ORDER — ALLOPURINOL 300 MG PO TABS: 300.0000 mg | ORAL_TABLET | Freq: Every day | ORAL | 3 refills | Status: DC

## 2019-03-05 NOTE — Telephone Encounter (Signed)
Okay to refill? 

## 2019-03-06 ENCOUNTER — Encounter (HOSPITAL_COMMUNITY): Payer: Self-pay

## 2019-03-09 ENCOUNTER — Telehealth (HOSPITAL_COMMUNITY): Payer: Self-pay | Admitting: Cardiac Rehabilitation

## 2019-03-09 ENCOUNTER — Encounter (HOSPITAL_COMMUNITY): Payer: Self-pay

## 2019-03-09 NOTE — Telephone Encounter (Signed)
Phone call to patient to notify of [facility] Phase II departmental closing for 2 weeks.  Left message on voicemail.   Andi Hence, RN, BSN Cardiac Pulmonary Rehab

## 2019-03-11 ENCOUNTER — Encounter (HOSPITAL_COMMUNITY): Payer: Self-pay

## 2019-03-13 ENCOUNTER — Encounter (HOSPITAL_COMMUNITY): Payer: Self-pay

## 2019-03-16 ENCOUNTER — Encounter (HOSPITAL_COMMUNITY): Payer: Self-pay

## 2019-03-17 ENCOUNTER — Telehealth (HOSPITAL_COMMUNITY): Payer: Self-pay | Admitting: Family Medicine

## 2019-03-18 ENCOUNTER — Encounter (HOSPITAL_COMMUNITY): Payer: Self-pay

## 2019-03-20 ENCOUNTER — Encounter (HOSPITAL_COMMUNITY): Payer: Self-pay

## 2019-03-23 ENCOUNTER — Encounter (HOSPITAL_COMMUNITY): Payer: Self-pay

## 2019-03-25 ENCOUNTER — Encounter (HOSPITAL_COMMUNITY): Payer: Self-pay

## 2019-03-27 ENCOUNTER — Encounter (HOSPITAL_COMMUNITY): Payer: Self-pay

## 2019-03-30 ENCOUNTER — Encounter (HOSPITAL_COMMUNITY): Payer: Self-pay

## 2019-04-01 ENCOUNTER — Encounter (HOSPITAL_COMMUNITY): Payer: Self-pay

## 2019-04-03 ENCOUNTER — Encounter (HOSPITAL_COMMUNITY): Payer: Self-pay

## 2019-04-03 ENCOUNTER — Telehealth (HOSPITAL_COMMUNITY): Payer: Self-pay | Admitting: *Deleted

## 2019-04-03 NOTE — Telephone Encounter (Signed)
Called to notify patient that the cardiac and pulmonary rehabilitation department remains closed at this time due to COVID-19 restrictions. Pt verbalized understanding.  Sol Passer, MS, ACSM CEP 04/03/2019 1058

## 2019-04-06 ENCOUNTER — Encounter (HOSPITAL_COMMUNITY): Payer: Self-pay

## 2019-04-08 ENCOUNTER — Encounter (HOSPITAL_COMMUNITY): Payer: Self-pay

## 2019-04-10 ENCOUNTER — Encounter (HOSPITAL_COMMUNITY): Payer: Self-pay

## 2019-04-13 ENCOUNTER — Encounter (HOSPITAL_COMMUNITY): Payer: Self-pay

## 2019-04-14 ENCOUNTER — Encounter: Payer: Self-pay | Admitting: Family Medicine

## 2019-04-15 ENCOUNTER — Encounter (HOSPITAL_COMMUNITY): Payer: Self-pay

## 2019-04-15 MED ORDER — PANTOPRAZOLE SODIUM 40 MG PO TBEC: 40.0000 mg | DELAYED_RELEASE_TABLET | Freq: Every day | ORAL | 3 refills | Status: DC

## 2019-04-17 ENCOUNTER — Encounter (HOSPITAL_COMMUNITY): Payer: Self-pay

## 2019-04-20 ENCOUNTER — Encounter (HOSPITAL_COMMUNITY): Payer: Self-pay

## 2019-04-22 ENCOUNTER — Encounter (HOSPITAL_COMMUNITY): Payer: Self-pay

## 2019-04-24 ENCOUNTER — Encounter (HOSPITAL_COMMUNITY): Payer: Self-pay

## 2019-04-27 ENCOUNTER — Encounter (HOSPITAL_COMMUNITY): Payer: Self-pay

## 2019-04-29 ENCOUNTER — Encounter (HOSPITAL_COMMUNITY): Payer: Self-pay

## 2019-04-29 ENCOUNTER — Ambulatory Visit: Payer: Self-pay | Admitting: *Deleted

## 2019-04-29 NOTE — Telephone Encounter (Signed)
Patient inquiring about tomorrow's appointment. Informed him he should received a call before the appointment to go over instructions with him. Informed of [email or web address] link to connect.   Reason for Disposition . General information question, no triage required and triager able to answer question  Answer Assessment - Initial Assessment Questions 1. REASON FOR CALL or QUESTION: "What is your reason for calling today?" or "How can I best help you?" or "What question do you have that I can help answer?"     Information on tomorrow's appointment.  Protocols used: INFORMATION ONLY CALL-A-AH

## 2019-04-30 ENCOUNTER — Ambulatory Visit (INDEPENDENT_AMBULATORY_CARE_PROVIDER_SITE_OTHER): Payer: Medicare Other | Admitting: Family Medicine

## 2019-04-30 ENCOUNTER — Encounter: Payer: Self-pay | Admitting: Family Medicine

## 2019-04-30 VITALS — BP 114/61 | HR 78 | Temp 96.5°F | Ht 63.0 in

## 2019-04-30 DIAGNOSIS — Z794 Long term (current) use of insulin: Secondary | ICD-10-CM | POA: Diagnosis not present

## 2019-04-30 DIAGNOSIS — I10 Essential (primary) hypertension: Secondary | ICD-10-CM | POA: Diagnosis not present

## 2019-04-30 DIAGNOSIS — K219 Gastro-esophageal reflux disease without esophagitis: Secondary | ICD-10-CM | POA: Diagnosis not present

## 2019-04-30 DIAGNOSIS — J439 Emphysema, unspecified: Secondary | ICD-10-CM

## 2019-04-30 DIAGNOSIS — E1151 Type 2 diabetes mellitus with diabetic peripheral angiopathy without gangrene: Secondary | ICD-10-CM
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
## 2019-05-01 ENCOUNTER — Encounter (HOSPITAL_COMMUNITY): Payer: Self-pay

## 2019-05-04 ENCOUNTER — Encounter (HOSPITAL_COMMUNITY): Payer: Self-pay

## 2019-05-06 ENCOUNTER — Encounter (HOSPITAL_COMMUNITY): Payer: Self-pay

## 2019-05-08 ENCOUNTER — Encounter (HOSPITAL_COMMUNITY): Payer: Self-pay

## 2019-05-11 ENCOUNTER — Encounter (HOSPITAL_COMMUNITY): Payer: Self-pay

## 2019-05-13 ENCOUNTER — Encounter (HOSPITAL_COMMUNITY): Payer: Self-pay

## 2019-05-15 ENCOUNTER — Encounter (HOSPITAL_COMMUNITY): Payer: Self-pay

## 2019-05-20 ENCOUNTER — Encounter (HOSPITAL_COMMUNITY): Payer: Self-pay

## 2019-05-21 DIAGNOSIS — L821 Other seborrheic keratosis: Secondary | ICD-10-CM | POA: Diagnosis not present

## 2019-05-21 DIAGNOSIS — C44619 Basal cell carcinoma of skin of left upper limb, including shoulder: Secondary | ICD-10-CM | POA: Diagnosis not present

## 2019-05-21 DIAGNOSIS — L570 Actinic keratosis: Secondary | ICD-10-CM | POA: Diagnosis not present

## 2019-05-21 DIAGNOSIS — Z85828 Personal history of other malignant neoplasm of skin: Secondary | ICD-10-CM | POA: Diagnosis not present

## 2019-05-21 DIAGNOSIS — D485 Neoplasm of uncertain behavior of skin: Secondary | ICD-10-CM | POA: Diagnosis not present

## 2019-05-21 DIAGNOSIS — D2230 Melanocytic nevi of unspecified part of face: Secondary | ICD-10-CM | POA: Diagnosis not present

## 2019-05-22 ENCOUNTER — Encounter (HOSPITAL_COMMUNITY): Payer: Self-pay

## 2019-05-25 ENCOUNTER — Encounter (HOSPITAL_COMMUNITY): Payer: Self-pay

## 2019-05-26 ENCOUNTER — Other Ambulatory Visit: Payer: Self-pay | Admitting: Cardiovascular Disease

## 2019-05-26 MED ORDER — FUROSEMIDE 40 MG PO TABS: 80.0000 mg | ORAL_TABLET | Freq: Two times a day (BID) | ORAL | 0 refills | Status: DC

## 2019-05-27 ENCOUNTER — Encounter (HOSPITAL_COMMUNITY): Payer: Self-pay

## 2019-05-29 ENCOUNTER — Encounter (HOSPITAL_COMMUNITY): Payer: Self-pay

## 2019-06-01 ENCOUNTER — Encounter (HOSPITAL_COMMUNITY): Payer: Self-pay

## 2019-06-03 ENCOUNTER — Encounter (HOSPITAL_COMMUNITY): Payer: Self-pay

## 2019-06-05 ENCOUNTER — Encounter (HOSPITAL_COMMUNITY): Payer: Self-pay

## 2019-06-08 ENCOUNTER — Encounter (HOSPITAL_COMMUNITY): Payer: Self-pay

## 2019-06-10 ENCOUNTER — Encounter (HOSPITAL_COMMUNITY): Payer: Self-pay

## 2019-06-12 ENCOUNTER — Encounter (HOSPITAL_COMMUNITY): Payer: Self-pay

## 2019-06-15 ENCOUNTER — Encounter (HOSPITAL_COMMUNITY): Payer: Self-pay

## 2019-06-17 ENCOUNTER — Encounter (HOSPITAL_COMMUNITY): Payer: Self-pay

## 2019-06-19 ENCOUNTER — Encounter (HOSPITAL_COMMUNITY): Payer: Self-pay

## 2019-06-22 ENCOUNTER — Encounter (HOSPITAL_COMMUNITY): Payer: Self-pay

## 2019-06-23 ENCOUNTER — Telehealth (HOSPITAL_COMMUNITY): Payer: Self-pay

## 2019-06-24 ENCOUNTER — Encounter (HOSPITAL_COMMUNITY): Payer: Self-pay

## 2019-06-25 ENCOUNTER — Other Ambulatory Visit: Payer: Self-pay

## 2019-06-29 ENCOUNTER — Encounter (HOSPITAL_COMMUNITY): Payer: Self-pay

## 2019-06-30 ENCOUNTER — Other Ambulatory Visit: Payer: Self-pay

## 2019-06-30 ENCOUNTER — Ambulatory Visit (INDEPENDENT_AMBULATORY_CARE_PROVIDER_SITE_OTHER): Payer: Medicare Other | Admitting: Endocrinology

## 2019-06-30 ENCOUNTER — Encounter: Payer: Self-pay | Admitting: Endocrinology

## 2019-06-30 VITALS — BP 162/70 | HR 80 | Ht 63.0 in | Wt 222.8 lb

## 2019-06-30 DIAGNOSIS — Z794 Long term (current) use of insulin: Secondary | ICD-10-CM | POA: Diagnosis not present

## 2019-06-30 DIAGNOSIS — E1151 Type 2 diabetes mellitus with diabetic peripheral angiopathy without gangrene: Secondary | ICD-10-CM | POA: Diagnosis not present

## 2019-06-30 LAB — POCT GLYCOSYLATED HEMOGLOBIN (HGB A1C): Hemoglobin A1C: 7.1 % — AB (ref 4.0–5.6)

## 2019-06-30 MED ORDER — INSULIN ASPART 100 UNIT/ML ~~LOC~~ SOLN: SUBCUTANEOUS | 11 refills | Status: DC

## 2019-06-30 NOTE — Patient Instructions (Addendum)
Your blood pressure is high today.  Please see your primary care provider soon, to have it rechecked Please continue the same medications.  check your blood sugar twice a day.  vary the time of day when you check, between before the 3 meals, and at bedtime.  also check if you have symptoms of your blood sugar being too high or too low.  please keep a record of the readings and bring it to your next appointment here (or you can bring the meter itself).  You can write it on any piece of paper.  please call us sooner if your blood sugar goes below 70, or if you have a lot of readings over 200. Please come back for a follow-up appointment in 4-5 months.
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
## 2019-07-01 ENCOUNTER — Encounter (HOSPITAL_COMMUNITY): Payer: Self-pay

## 2019-07-03 ENCOUNTER — Encounter (HOSPITAL_COMMUNITY): Payer: Self-pay

## 2019-07-06 ENCOUNTER — Encounter (HOSPITAL_COMMUNITY): Payer: Self-pay

## 2019-07-07 DIAGNOSIS — L905 Scar conditions and fibrosis of skin: Secondary | ICD-10-CM | POA: Diagnosis not present

## 2019-07-07 DIAGNOSIS — C44619 Basal cell carcinoma of skin of left upper limb, including shoulder: Secondary | ICD-10-CM | POA: Diagnosis not present

## 2019-07-08 ENCOUNTER — Encounter (HOSPITAL_COMMUNITY): Payer: Self-pay

## 2019-07-10 ENCOUNTER — Encounter (HOSPITAL_COMMUNITY): Payer: Self-pay

## 2019-07-13 ENCOUNTER — Encounter (HOSPITAL_COMMUNITY): Payer: Self-pay

## 2019-07-15 ENCOUNTER — Encounter (HOSPITAL_COMMUNITY): Payer: Self-pay

## 2019-07-17 ENCOUNTER — Encounter (HOSPITAL_COMMUNITY): Payer: Self-pay

## 2019-07-20 ENCOUNTER — Encounter (HOSPITAL_COMMUNITY): Payer: Self-pay

## 2019-07-22 ENCOUNTER — Encounter (HOSPITAL_COMMUNITY): Payer: Self-pay

## 2019-07-24 ENCOUNTER — Encounter (HOSPITAL_COMMUNITY): Payer: Self-pay

## 2019-08-06 ENCOUNTER — Other Ambulatory Visit: Payer: Self-pay | Admitting: Cardiovascular Disease

## 2019-08-06 ENCOUNTER — Telehealth: Payer: Self-pay | Admitting: Emergency Medicine

## 2019-08-06 MED ORDER — FUROSEMIDE 40 MG PO TABS: 80.0000 mg | ORAL_TABLET | Freq: Two times a day (BID) | ORAL | 0 refills | Status: DC

## 2019-08-06 NOTE — Telephone Encounter (Signed)
Spoke with patient. He stated that he needed to be re-qualified for his O2 from [employer]. Explained to patient that since he had not been seen since early 2019, he would need an OV as well. Patient verbalized understanding. He has been scheduled with TP for Monday 8/17 at 230.   Nothing further needed at time of call.

## 2019-08-07 DIAGNOSIS — E119 Type 2 diabetes mellitus without complications: Secondary | ICD-10-CM | POA: Diagnosis not present

## 2019-08-07 DIAGNOSIS — H5203 Hypermetropia, bilateral: Secondary | ICD-10-CM | POA: Diagnosis not present

## 2019-08-07 DIAGNOSIS — H26491 Other secondary cataract, right eye: Secondary | ICD-10-CM | POA: Diagnosis not present

## 2019-08-07 DIAGNOSIS — H52203 Unspecified astigmatism, bilateral: Secondary | ICD-10-CM | POA: Diagnosis not present

## 2019-08-07 LAB — HM DIABETES EYE EXAM

## 2019-08-10 ENCOUNTER — Telehealth: Payer: Self-pay | Admitting: *Deleted

## 2019-08-10 ENCOUNTER — Ambulatory Visit (INDEPENDENT_AMBULATORY_CARE_PROVIDER_SITE_OTHER): Payer: Medicare Other | Admitting: Adult Health

## 2019-08-10 ENCOUNTER — Encounter: Payer: Self-pay | Admitting: Adult Health

## 2019-08-10 ENCOUNTER — Other Ambulatory Visit: Payer: Self-pay

## 2019-08-10 VITALS — BP 126/72 | HR 70 | Temp 98.2°F | Ht 63.0 in | Wt 224.0 lb

## 2019-08-10 DIAGNOSIS — G4734 Idiopathic sleep related nonobstructive alveolar hypoventilation: Secondary | ICD-10-CM | POA: Diagnosis not present

## 2019-08-10 DIAGNOSIS — I5032 Chronic diastolic (congestive) heart failure: Secondary | ICD-10-CM | POA: Diagnosis not present

## 2019-08-10 DIAGNOSIS — J9611 Chronic respiratory failure with hypoxia: Secondary | ICD-10-CM | POA: Diagnosis not present

## 2019-08-10 DIAGNOSIS — J439 Emphysema, unspecified: Secondary | ICD-10-CM

## 2019-08-10 DIAGNOSIS — J9612 Chronic respiratory failure with hypercapnia: Secondary | ICD-10-CM | POA: Diagnosis not present

## 2019-08-10 MED ORDER — FUROSEMIDE 40 MG PO TABS: ORAL_TABLET | ORAL | 1 refills | Status: DC

## 2019-08-10 MED ORDER — AZELASTINE HCL 0.1 % NA SOLN: 2.0000 | Freq: Two times a day (BID) | NASAL | 5 refills | Status: DC | PRN

## 2019-08-10 NOTE — Assessment & Plan Note (Signed)
Appears compensated without exacerbation  Plan  Patient Instructions  Continue Symbicort and Spiriva . Rinse after use.  Continue on Lasix .  Low salt diet .  Legs elevated.  Activity as tolerated.  Continue on Oxygen 2l/m At bedtime  .  Set up for ONO on room air for insurance requirements.  Please contact office for sooner follow up if symptoms do not improve or worsen or seek emergency care  Follow up Dr. Lamonte Sakai in 6 months and As needed

## 2019-08-10 NOTE — Assessment & Plan Note (Signed)
Compensated on present regimen  Plan  Patient Instructions  Continue Symbicort and Spiriva . Rinse after use.  Continue on Lasix .  Low salt diet .  Legs elevated.  Activity as tolerated.  Continue on Oxygen 2l/m At bedtime  .  Set up for ONO on room air for insurance requirements.  Please contact office for sooner follow up if symptoms do not improve or worsen or seek emergency care  Follow up Dr. Lamonte Sakai in 6 months and As needed

## 2019-08-10 NOTE — Telephone Encounter (Signed)
Pt notified. Will send prescription to [facility] at [address]. Pt will contact [place] to have labs sent to our office.

## 2019-08-10 NOTE — Telephone Encounter (Signed)
From: [name].  Sent: 08/09/2019  5:23 PM EDT  To: [facility] Triage  Subject: Non-Urgent Medical Question             I need a new script for furosemide. My last time it was for 2am &2pm that was cut down from 3and3. Dr Angelena Form said if this didn't work take extra one I have been doing this for about a year and now I am running out. The cost of filling early is 108.00 versus 11.45. Thanks [name] if u need to call me my cell [phone]    Above message copied from my chart message.  I spoke with pt. He reports he has been taking Lasix 120 mg in the AM and 80 mg in the PM for past year.  Our records indicate he is taking 80 mg twice daily.  A refill for this dose was sent to his pharmacy but it is too soon for pharmacy to refill as pt has been taking more than prescription states.  Per last note lasix dosing was to be managed by Dr. Florene Glen.  Pt reports shortly after last visit in our office Dr. Florene Glen left practice.  Pt has not seen nephrology since he had last visit in our office in September 2019.  Pt has 5 days of lasix left and is asking if Dr. Angelena Form would refill for 120 mg in the AM and 80 in the PM.  Pt state he had lab work at the [place] recently and it was OK.  I do not see these results in his chart.

## 2019-08-10 NOTE — Assessment & Plan Note (Signed)
Continue on oxygen at bedtime overnight oximetry test on room air due to insurance requirements

## 2019-08-10 NOTE — Telephone Encounter (Signed)
That's ok to refill Pat. Can we ask him to have the [place] fax over the lab work? Thanks, chris

## 2019-08-10 NOTE — Patient Instructions (Addendum)
Continue Symbicort and Spiriva . Rinse after use.  Continue on Lasix .  Low salt diet .  Legs elevated.  Activity as tolerated.  Continue on Oxygen 2l/m At bedtime  .  Set up for ONO on room air for insurance requirements.  Please contact office for sooner follow up if symptoms do not improve or worsen or seek emergency care  Follow up Dr. Lamonte Sakai in 6 months and As needed
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
## 2019-08-13 DIAGNOSIS — R0902 Hypoxemia: Secondary | ICD-10-CM | POA: Diagnosis not present

## 2019-08-13 DIAGNOSIS — J449 Chronic obstructive pulmonary disease, unspecified: Secondary | ICD-10-CM | POA: Diagnosis not present

## 2019-08-14 ENCOUNTER — Encounter: Payer: Self-pay | Admitting: Family Medicine

## 2019-08-18 ENCOUNTER — Encounter: Payer: Self-pay | Admitting: Family Medicine

## 2019-08-19 MED ORDER — POTASSIUM CHLORIDE ER 10 MEQ PO TBCR: 10.0000 meq | EXTENDED_RELEASE_TABLET | Freq: Every day | ORAL | 3 refills | Status: DC

## 2019-08-19 MED ORDER — CARVEDILOL 25 MG PO TABS: ORAL_TABLET | ORAL | 0 refills | Status: DC

## 2019-08-19 NOTE — Telephone Encounter (Signed)
Pt's medication was sent to pt's pharmacy as requested. Confirmation received.  °

## 2019-08-20 ENCOUNTER — Telehealth: Payer: Self-pay | Admitting: Adult Health

## 2019-08-20 DIAGNOSIS — J9611 Chronic respiratory failure with hypoxia: Secondary | ICD-10-CM

## 2019-08-20 DIAGNOSIS — I5032 Chronic diastolic (congestive) heart failure: Secondary | ICD-10-CM

## 2019-08-20 DIAGNOSIS — G4734 Idiopathic sleep related nonobstructive alveolar hypoventilation: Secondary | ICD-10-CM

## 2019-08-20 DIAGNOSIS — J439 Emphysema, unspecified: Secondary | ICD-10-CM

## 2019-08-20 NOTE — Telephone Encounter (Signed)
Patient had visit w/ TP 8.17.2020 and ONO was ordered to recertify for O2 per insurance  08/13/2019 ONO on RA results received and reviewed by TP: continue on nocturnal O2.  Called spoke with patient, made him aware of the above.  Patient voiced his understanding.  Order sent to [employer].  ONO sent for scan.  Nothing further needed at this time; will sign off.

## 2019-08-24 ENCOUNTER — Telehealth: Payer: Self-pay | Admitting: Behavioral Health

## 2019-08-24 NOTE — Telephone Encounter (Signed)
Questions for Screening COVID-19  During this illness, did/does the patient experience any of the following symptoms? Fever >100.4F []  Yes [x]  No []  Unknown Subjective fever (felt feverish) []  Yes [x]  No []  Unknown Chills []  Yes [x]  No []  Unknown Muscle aches (myalgia) []  Yes [x]  No []  Unknown Runny nose (rhinorrhea) []  Yes [x]  No []  Unknown Sore throat []  Yes [x]  No []  Unknown Cough (new onset or worsening of chronic cough) []  Yes [x]  No []  Unknown Shortness of breath (dyspnea) []  Yes [x]  No []  Unknown Nausea or vomiting []  Yes [x]  No []  Unknown Headache []  Yes [x]  No []  Unknown Abdominal pain  []  Yes [x]  No []  Unknown Diarrhea (?3 loose/looser than normal stools/24hr period) []  Yes [x]  No []  Unknown  

## 2019-08-25 ENCOUNTER — Encounter: Payer: Self-pay | Admitting: Family Medicine

## 2019-08-25 ENCOUNTER — Ambulatory Visit (INDEPENDENT_AMBULATORY_CARE_PROVIDER_SITE_OTHER): Payer: Medicare Other

## 2019-08-25 ENCOUNTER — Other Ambulatory Visit: Payer: Self-pay

## 2019-08-25 DIAGNOSIS — Z23 Encounter for immunization: Secondary | ICD-10-CM | POA: Diagnosis not present

## 2019-08-25 NOTE — Progress Notes (Signed)
After obtaining consent, and per orders of Dr. Bryan Lemma, injection of HD Flu given in left deltoid by Jaquelinne Glendening Berneta Sages. Patient instructed to remain in clinic for 20 minutes afterwards, and to report any adverse reaction to me immediately/thx dmf

## 2019-09-16 ENCOUNTER — Encounter: Payer: Self-pay | Admitting: Family Medicine

## 2019-09-16 NOTE — Telephone Encounter (Signed)
There is a telephone message with the following . Does DME need more than this ?   Patient had visit w/ TP 8.17.2020 and ONO was ordered to recertify for O2 per insurance  08/13/2019 ONO on RA results received and reviewed by TP: continue on nocturnal O2.  Called spoke with patient, made him aware of the above.  Patient voiced his understanding.  Order sent to [employer].  ONO sent for scan.  Nothing further needed at this time; will sign off.

## 2019-09-16 NOTE — Telephone Encounter (Signed)
Patient sent email this morning stating  "[name] I keep getting calls from [employer] on my oxygen recertification they have received the results of the 24 hour test. They are asking for chart notes?  Can you help get this resolved. Thank you."  [name] please advise.

## 2019-09-16 NOTE — Telephone Encounter (Signed)
[facility] [employer] was on hold for 12 minutes. I emailed patient back to say if this isn't enough for [employer] then have them call our office to let us know.

## 2019-09-25 ENCOUNTER — Encounter: Payer: Self-pay | Admitting: Family Medicine

## 2019-10-02 ENCOUNTER — Telehealth: Payer: Self-pay | Admitting: Family Medicine

## 2019-10-02 NOTE — Telephone Encounter (Signed)
Spoke with pt and he states at the time he called he did not see my mychart message that form was placed up front for pick up. Pt has already came and picked up form and he had no additional questions at this time.

## 2019-10-02 NOTE — Telephone Encounter (Signed)
Patient checking on the status of form he dropped off on Tuesday 09/29/2019. The form was from the city regarding assistance needed to pick up garbage cans from one location to the curb, please advise

## 2019-10-04 ENCOUNTER — Encounter: Payer: Self-pay | Admitting: Family Medicine

## 2019-10-05 MED ORDER — CITALOPRAM HYDROBROMIDE 10 MG PO TABS: ORAL_TABLET | ORAL | 3 refills | Status: DC

## 2019-10-08 DIAGNOSIS — L570 Actinic keratosis: Secondary | ICD-10-CM | POA: Diagnosis not present

## 2019-10-08 DIAGNOSIS — D2230 Melanocytic nevi of unspecified part of face: Secondary | ICD-10-CM | POA: Diagnosis not present

## 2019-10-08 DIAGNOSIS — Z23 Encounter for immunization: Secondary | ICD-10-CM | POA: Diagnosis not present

## 2019-10-08 DIAGNOSIS — Z85828 Personal history of other malignant neoplasm of skin: Secondary | ICD-10-CM | POA: Diagnosis not present

## 2019-10-08 DIAGNOSIS — D225 Melanocytic nevi of trunk: Secondary | ICD-10-CM | POA: Diagnosis not present

## 2019-10-08 DIAGNOSIS — Z86018 Personal history of other benign neoplasm: Secondary | ICD-10-CM | POA: Diagnosis not present

## 2019-10-08 DIAGNOSIS — L821 Other seborrheic keratosis: Secondary | ICD-10-CM | POA: Diagnosis not present

## 2019-10-13 ENCOUNTER — Encounter (HOSPITAL_COMMUNITY): Payer: Self-pay

## 2019-10-14 ENCOUNTER — Encounter: Payer: Self-pay | Admitting: Family Medicine

## 2019-10-15 ENCOUNTER — Ambulatory Visit (HOSPITAL_COMMUNITY): Payer: Medicare Other

## 2019-10-15 ENCOUNTER — Encounter (HOSPITAL_COMMUNITY): Payer: Self-pay

## 2019-10-15 MED ORDER — ROSUVASTATIN CALCIUM 10 MG PO TABS: 10.0000 mg | ORAL_TABLET | Freq: Every day | ORAL | 3 refills | Status: DC

## 2019-10-20 ENCOUNTER — Other Ambulatory Visit: Payer: Self-pay

## 2019-10-20 ENCOUNTER — Encounter (HOSPITAL_COMMUNITY): Payer: Self-pay

## 2019-10-20 ENCOUNTER — Encounter (HOSPITAL_COMMUNITY)
Admission: RE | Admit: 2019-10-20 | Discharge: 2019-10-20 | Disposition: A | Discharge status: Home / Self Care | Payer: Self-pay | Source: Ambulatory Visit | Attending: Cardiovascular Disease | Admitting: Cardiovascular Disease

## 2019-10-20 DIAGNOSIS — I509 Heart failure, unspecified: Secondary | ICD-10-CM | POA: Insufficient documentation

## 2019-10-20 DIAGNOSIS — I2510 Atherosclerotic heart disease of native coronary artery without angina pectoris: Secondary | ICD-10-CM | POA: Insufficient documentation

## 2019-10-20 NOTE — Progress Notes (Signed)
[name]. presented today for his first day of exercise in the cardiac rehab maintenance program.   Entrance Vitals Blood Pressure: 132/72 Heart Rate: 72 Oxygen Saturation: 96% room air  Exercising Vitals Blood Pressure: 128/80  Additional Comments: Oriented patient to the exercise routine. Patient tolerated low intensity exercise fairly well with some fatigue. Patient states he did better with exercise than he thought he would. No symptoms with exercise.  Sol Passer, MS, ACSM CEP 10/20/2019 [phone]

## 2019-10-22 ENCOUNTER — Encounter (HOSPITAL_COMMUNITY): Payer: Self-pay

## 2019-10-22 ENCOUNTER — Encounter (HOSPITAL_COMMUNITY)
Admission: RE | Admit: 2019-10-22 | Discharge: 2019-10-22 | Disposition: A | Discharge status: Home / Self Care | Payer: Self-pay | Source: Ambulatory Visit | Attending: Cardiovascular Disease | Admitting: Cardiovascular Disease

## 2019-10-22 ENCOUNTER — Other Ambulatory Visit: Payer: Self-pay

## 2019-10-27 ENCOUNTER — Other Ambulatory Visit: Payer: Self-pay

## 2019-10-27 ENCOUNTER — Encounter (HOSPITAL_COMMUNITY): Payer: Self-pay

## 2019-10-27 ENCOUNTER — Encounter (HOSPITAL_COMMUNITY)
Admission: RE | Admit: 2019-10-27 | Discharge: 2019-10-27 | Disposition: A | Discharge status: Home / Self Care | Payer: Self-pay | Source: Ambulatory Visit | Attending: Cardiovascular Disease | Admitting: Cardiovascular Disease

## 2019-10-27 DIAGNOSIS — I509 Heart failure, unspecified: Secondary | ICD-10-CM | POA: Insufficient documentation

## 2019-10-27 DIAGNOSIS — I2510 Atherosclerotic heart disease of native coronary artery without angina pectoris: Secondary | ICD-10-CM | POA: Insufficient documentation

## 2019-10-29 ENCOUNTER — Encounter (HOSPITAL_COMMUNITY): Payer: Self-pay

## 2019-10-29 ENCOUNTER — Other Ambulatory Visit: Payer: Self-pay

## 2019-10-29 ENCOUNTER — Encounter (HOSPITAL_COMMUNITY)
Admission: RE | Admit: 2019-10-29 | Discharge: 2019-10-29 | Disposition: A | Discharge status: Home / Self Care | Payer: Self-pay | Source: Ambulatory Visit | Attending: Cardiovascular Disease | Admitting: Cardiovascular Disease

## 2019-11-02 ENCOUNTER — Encounter: Payer: Self-pay | Admitting: Cardiovascular Disease

## 2019-11-02 ENCOUNTER — Other Ambulatory Visit: Payer: Self-pay

## 2019-11-02 ENCOUNTER — Ambulatory Visit (INDEPENDENT_AMBULATORY_CARE_PROVIDER_SITE_OTHER): Payer: Medicare Other | Admitting: Cardiovascular Disease

## 2019-11-02 VITALS — BP 136/60 | HR 72 | Ht 63.0 in | Wt 223.4 lb

## 2019-11-02 DIAGNOSIS — I2510 Atherosclerotic heart disease of native coronary artery without angina pectoris: Secondary | ICD-10-CM

## 2019-11-02 DIAGNOSIS — I10 Essential (primary) hypertension: Secondary | ICD-10-CM

## 2019-11-02 DIAGNOSIS — I255 Ischemic cardiomyopathy: Secondary | ICD-10-CM

## 2019-11-02 DIAGNOSIS — E7800 Pure hypercholesterolemia, unspecified: Secondary | ICD-10-CM | POA: Diagnosis not present

## 2019-11-02 DIAGNOSIS — I5042 Chronic combined systolic (congestive) and diastolic (congestive) heart failure: Secondary | ICD-10-CM
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
## 2019-11-02 NOTE — Patient Instructions (Signed)

## 2019-11-03 ENCOUNTER — Encounter (HOSPITAL_COMMUNITY)
Admission: RE | Admit: 2019-11-03 | Discharge: 2019-11-03 | Disposition: A | Discharge status: Home / Self Care | Payer: Self-pay | Source: Ambulatory Visit | Attending: Cardiovascular Disease | Admitting: Cardiovascular Disease

## 2019-11-03 ENCOUNTER — Encounter (HOSPITAL_COMMUNITY): Payer: Self-pay

## 2019-11-03 NOTE — Addendum Note (Signed)
Addended by: Mendel Ryder on: 11/03/2019 02:06 PM   Modules accepted: Orders

## 2019-11-05 ENCOUNTER — Encounter (HOSPITAL_COMMUNITY)
Admission: RE | Admit: 2019-11-05 | Discharge: 2019-11-05 | Disposition: A | Discharge status: Home / Self Care | Payer: Self-pay | Source: Ambulatory Visit | Attending: Cardiovascular Disease | Admitting: Cardiovascular Disease

## 2019-11-05 ENCOUNTER — Other Ambulatory Visit: Payer: Self-pay

## 2019-11-05 ENCOUNTER — Encounter (HOSPITAL_COMMUNITY): Payer: Self-pay

## 2019-11-10 ENCOUNTER — Encounter (HOSPITAL_COMMUNITY): Payer: Self-pay

## 2019-11-10 ENCOUNTER — Other Ambulatory Visit: Payer: Self-pay

## 2019-11-10 ENCOUNTER — Encounter (HOSPITAL_COMMUNITY)
Admission: RE | Admit: 2019-11-10 | Discharge: 2019-11-10 | Disposition: A | Discharge status: Home / Self Care | Payer: Self-pay | Source: Ambulatory Visit | Attending: Cardiovascular Disease | Admitting: Cardiovascular Disease

## 2019-11-12 ENCOUNTER — Other Ambulatory Visit: Payer: Self-pay

## 2019-11-12 ENCOUNTER — Encounter (HOSPITAL_COMMUNITY): Payer: Self-pay

## 2019-11-12 ENCOUNTER — Encounter (HOSPITAL_COMMUNITY)
Admission: RE | Admit: 2019-11-12 | Discharge: 2019-11-12 | Disposition: A | Discharge status: Home / Self Care | Payer: Self-pay | Source: Ambulatory Visit | Attending: Cardiovascular Disease | Admitting: Cardiovascular Disease

## 2019-11-17 ENCOUNTER — Encounter (HOSPITAL_COMMUNITY)
Admission: RE | Admit: 2019-11-17 | Discharge: 2019-11-17 | Disposition: A | Discharge status: Home / Self Care | Payer: Self-pay | Source: Ambulatory Visit | Attending: Cardiovascular Disease | Admitting: Cardiovascular Disease

## 2019-11-17 ENCOUNTER — Encounter (HOSPITAL_COMMUNITY): Payer: Self-pay

## 2019-11-17 ENCOUNTER — Other Ambulatory Visit: Payer: Self-pay

## 2019-11-24 ENCOUNTER — Other Ambulatory Visit: Payer: Self-pay

## 2019-11-24 ENCOUNTER — Encounter (HOSPITAL_COMMUNITY): Payer: Self-pay

## 2019-11-24 ENCOUNTER — Encounter (HOSPITAL_COMMUNITY)
Admission: RE | Admit: 2019-11-24 | Discharge: 2019-11-24 | Disposition: A | Discharge status: Home / Self Care | Payer: Self-pay | Source: Ambulatory Visit | Attending: Cardiovascular Disease | Admitting: Cardiovascular Disease

## 2019-11-24 DIAGNOSIS — I509 Heart failure, unspecified: Secondary | ICD-10-CM | POA: Insufficient documentation

## 2019-11-24 DIAGNOSIS — I2510 Atherosclerotic heart disease of native coronary artery without angina pectoris: Secondary | ICD-10-CM | POA: Insufficient documentation

## 2019-11-26 ENCOUNTER — Other Ambulatory Visit: Payer: Self-pay

## 2019-11-26 ENCOUNTER — Encounter (HOSPITAL_COMMUNITY): Payer: Self-pay

## 2019-11-26 ENCOUNTER — Encounter (HOSPITAL_COMMUNITY)
Admission: RE | Admit: 2019-11-26 | Discharge: 2019-11-26 | Disposition: A | Discharge status: Home / Self Care | Payer: Self-pay | Source: Ambulatory Visit | Attending: Cardiovascular Disease | Admitting: Cardiovascular Disease

## 2019-11-28 ENCOUNTER — Encounter: Payer: Self-pay | Admitting: Family Medicine

## 2019-11-29 ENCOUNTER — Encounter: Payer: Self-pay | Admitting: Family Medicine

## 2019-11-30 ENCOUNTER — Ambulatory Visit: Payer: Medicare Other | Admitting: Endocrinology

## 2019-11-30 MED ORDER — CARVEDILOL 25 MG PO TABS: 25.0000 mg | ORAL_TABLET | Freq: Two times a day (BID) | ORAL | 3 refills | Status: DC

## 2019-11-30 NOTE — Telephone Encounter (Signed)
Request sent to Dr. C. 

## 2019-11-30 NOTE — Telephone Encounter (Signed)
Pt requesting refills from you but Dr. Loanne Drilling is the provider who prescribed these medications.   Last ov 04/30/2019 Last fill 08/29/2018  #90/3

## 2019-11-30 NOTE — Telephone Encounter (Signed)
Pt's medication was sent to pt's pharmacy as requested. Confirmation received.  °

## 2019-12-01 ENCOUNTER — Encounter: Payer: Self-pay | Admitting: Family Medicine

## 2019-12-01 ENCOUNTER — Encounter (HOSPITAL_COMMUNITY): Admission: RE | Admit: 2019-12-01 | Payer: Self-pay | Source: Ambulatory Visit

## 2019-12-01 ENCOUNTER — Encounter (HOSPITAL_COMMUNITY): Payer: Self-pay

## 2019-12-01 ENCOUNTER — Other Ambulatory Visit: Payer: Self-pay

## 2019-12-02 MED ORDER — ISOSORBIDE MONONITRATE ER 60 MG PO TB24: 60.0000 mg | ORAL_TABLET | Freq: Every day | ORAL | 3 refills | Status: DC

## 2019-12-02 MED ORDER — FINASTERIDE 5 MG PO TABS: 5.0000 mg | ORAL_TABLET | Freq: Every day | ORAL | 3 refills | Status: DC

## 2019-12-02 MED ORDER — TRAZODONE HCL 100 MG PO TABS: ORAL_TABLET | ORAL | 3 refills | Status: DC

## 2019-12-02 MED ORDER — CALCITRIOL 0.25 MCG PO CAPS: 0.2500 ug | ORAL_CAPSULE | Freq: Every day | ORAL | 3 refills | Status: DC

## 2019-12-02 NOTE — Telephone Encounter (Signed)
I refilled meds as requested. Dr. Loanne Drilling was pts PCP as well as endo x 20 yrs but as of 09/2018 I am pts PCP and he sees Dr. Loanne Drilling for his endo issues

## 2019-12-03 ENCOUNTER — Encounter (HOSPITAL_COMMUNITY): Payer: Self-pay

## 2019-12-03 ENCOUNTER — Encounter (HOSPITAL_COMMUNITY)
Admission: RE | Admit: 2019-12-03 | Discharge: 2019-12-03 | Disposition: A | Discharge status: Home / Self Care | Payer: Medicare Other | Source: Ambulatory Visit | Attending: Cardiovascular Disease | Admitting: Cardiovascular Disease

## 2019-12-03 ENCOUNTER — Other Ambulatory Visit: Payer: Self-pay

## 2019-12-07 ENCOUNTER — Other Ambulatory Visit: Payer: Self-pay

## 2019-12-08 ENCOUNTER — Encounter (HOSPITAL_COMMUNITY): Payer: Self-pay

## 2019-12-09 ENCOUNTER — Ambulatory Visit: Payer: Medicare Other | Admitting: Endocrinology

## 2019-12-10 ENCOUNTER — Encounter (HOSPITAL_COMMUNITY): Payer: Self-pay

## 2019-12-10 ENCOUNTER — Telehealth (HOSPITAL_COMMUNITY): Payer: Self-pay | Admitting: Family Medicine

## 2019-12-15 ENCOUNTER — Encounter (HOSPITAL_COMMUNITY)
Admission: RE | Admit: 2019-12-15 | Discharge: 2019-12-15 | Disposition: A | Discharge status: Home / Self Care | Payer: Self-pay | Source: Ambulatory Visit | Attending: Cardiovascular Disease | Admitting: Cardiovascular Disease

## 2019-12-15 ENCOUNTER — Encounter (HOSPITAL_COMMUNITY): Payer: Self-pay

## 2019-12-15 ENCOUNTER — Other Ambulatory Visit: Payer: Self-pay

## 2019-12-17 ENCOUNTER — Encounter (HOSPITAL_COMMUNITY): Payer: Self-pay

## 2019-12-22 ENCOUNTER — Encounter (HOSPITAL_COMMUNITY): Payer: Self-pay

## 2019-12-22 ENCOUNTER — Encounter (HOSPITAL_COMMUNITY)
Admission: RE | Admit: 2019-12-22 | Discharge: 2019-12-22 | Disposition: A | Discharge status: Home / Self Care | Payer: Self-pay | Source: Ambulatory Visit | Attending: Cardiovascular Disease | Admitting: Cardiovascular Disease

## 2019-12-22 ENCOUNTER — Other Ambulatory Visit: Payer: Self-pay

## 2019-12-24 ENCOUNTER — Encounter (HOSPITAL_COMMUNITY): Payer: Self-pay

## 2019-12-24 ENCOUNTER — Telehealth: Payer: Self-pay

## 2019-12-24 NOTE — Telephone Encounter (Signed)
Received message from Vanita Ingles reference interest in [place]. Left VMT requesting call back to discuss. Next class start 01/11/2020

## 2019-12-29 ENCOUNTER — Encounter (HOSPITAL_COMMUNITY): Payer: Self-pay

## 2019-12-29 LAB — CBC AND DIFFERENTIAL
HCT: 40 — AB (ref 41–53)
Hemoglobin: 12.6 — AB (ref 13.5–17.5)
Platelets: 203 (ref 150–399)
WBC: 8.3

## 2019-12-29 LAB — BASIC METABOLIC PANEL
CO2: 36 — AB (ref 13–22)
Chloride: 97 — AB (ref 99–108)
Creatinine: 1.6 — AB (ref 0.6–1.3)
Glucose: 128
Potassium: 4 (ref 3.4–5.3)
Sodium: 137 (ref 137–147)

## 2019-12-29 LAB — LIPID PANEL
Cholesterol: 137 (ref 0–200)
HDL: 54 (ref 35–70)
LDL Cholesterol: 84
Triglycerides: 71 (ref 40–160)

## 2019-12-29 LAB — IRON,TIBC AND FERRITIN PANEL
Ferritin: 57.7
Iron: 78
TIBC: 329

## 2019-12-29 LAB — HEPATIC FUNCTION PANEL
ALT: 21 (ref 10–40)
AST: 8 — AB (ref 14–40)
Bilirubin, Total: 0.4

## 2019-12-29 LAB — COMPREHENSIVE METABOLIC PANEL
Albumin: 3 — AB (ref 3.5–5.0)
Calcium: 9.3 (ref 8.7–10.7)

## 2019-12-29 LAB — HEMOGLOBIN A1C: Hemoglobin A1C: 7.1

## 2019-12-29 LAB — CBC: RBC: 4.25 (ref 3.87–5.11)

## 2019-12-29 LAB — TSH: TSH: 2.38 (ref 0.41–5.90)

## 2019-12-30 ENCOUNTER — Other Ambulatory Visit: Payer: Self-pay

## 2019-12-30 ENCOUNTER — Encounter: Payer: Self-pay | Admitting: Endocrinology

## 2019-12-30 ENCOUNTER — Ambulatory Visit (INDEPENDENT_AMBULATORY_CARE_PROVIDER_SITE_OTHER): Payer: Medicare Other | Admitting: Endocrinology

## 2019-12-30 VITALS — BP 128/72 | HR 70 | Ht 63.0 in | Wt 219.4 lb

## 2019-12-30 DIAGNOSIS — E1151 Type 2 diabetes mellitus with diabetic peripheral angiopathy without gangrene: Secondary | ICD-10-CM | POA: Diagnosis not present

## 2019-12-30 DIAGNOSIS — Z794 Long term (current) use of insulin: Secondary | ICD-10-CM | POA: Diagnosis not present

## 2019-12-30 LAB — POCT GLYCOSYLATED HEMOGLOBIN (HGB A1C): Hemoglobin A1C: 6.8 % — AB (ref 4.0–5.6)

## 2019-12-30 MED ORDER — FUROSEMIDE 40 MG PO TABS: 120.0000 mg | ORAL_TABLET | Freq: Every day | ORAL | 1 refills | Status: DC

## 2019-12-30 MED ORDER — FARXIGA 5 MG PO TABS: 2.5000 mg | ORAL_TABLET | Freq: Every day | ORAL | 11 refills | Status: DC

## 2019-12-30 MED ORDER — INSULIN ASPART 100 UNIT/ML ~~LOC~~ SOLN: SUBCUTANEOUS | 11 refills | Status: DC
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
## 2019-12-30 NOTE — Patient Instructions (Addendum)
Please reduce the Novolog to the numbers listed below.  I have sent a prescription to your pharmacy, to add "farxiga." Also, you should reduce the furosemide to the morning only.   check your blood sugar twice a day.  vary the time of day when you check, between before the 3 meals, and at bedtime.  also check if you have symptoms of your blood sugar being too high or too low.  please keep a record of the readings and bring it to your next appointment here (or you can bring the meter itself).  You can write it on any piece of paper.  please call us sooner if your blood sugar goes below 70, or if you have a lot of readings over 200. Please come back for a follow-up appointment in 2 months.

## 2019-12-31 ENCOUNTER — Encounter (HOSPITAL_COMMUNITY): Payer: Self-pay

## 2019-12-31 ENCOUNTER — Telehealth: Payer: Self-pay

## 2019-12-31 NOTE — Telephone Encounter (Signed)
1.  He can get discount card at [email or web address] 2.  If an alternative is available, i'll switch

## 2019-12-31 NOTE — Telephone Encounter (Signed)
dapagliflozin propanediol (FARXIGA) 5 MG TABS tablet [id number]   As indicated above, generic was sent to pt pharmacy of choice. Please advise if you wish to change Rx to a more affordable Rx.

## 2019-12-31 NOTE — Telephone Encounter (Signed)
Called pt and informed him of Dr. Cordelia Pen suggestions below. Using closed-loop communication, pt verbalized complete acceptance and understanding of all information provided. No further questions nor concerns were voiced at this time.

## 2019-12-31 NOTE — Telephone Encounter (Signed)
Patient called in asking could he get a generic sent in for his dapagliflozin propanediol (FARXIGA) 5 MG TABS tablet . He stated that the pharmacy was charging him over $200 for [place]

## 2020-01-05 ENCOUNTER — Encounter (HOSPITAL_COMMUNITY): Payer: Self-pay

## 2020-01-05 MED ORDER — ALBUTEROL SULFATE HFA 108 (90 BASE) MCG/ACT IN AERS: 2.0000 | INHALATION_SPRAY | Freq: Four times a day (QID) | RESPIRATORY_TRACT | 0 refills | Status: DC | PRN

## 2020-01-07 ENCOUNTER — Encounter (HOSPITAL_COMMUNITY): Payer: Self-pay

## 2020-01-12 ENCOUNTER — Other Ambulatory Visit: Payer: Self-pay

## 2020-01-12 ENCOUNTER — Encounter (HOSPITAL_COMMUNITY): Payer: Self-pay

## 2020-01-12 MED ORDER — ALBUTEROL SULFATE HFA 108 (90 BASE) MCG/ACT IN AERS: 2.0000 | INHALATION_SPRAY | Freq: Four times a day (QID) | RESPIRATORY_TRACT | 3 refills | Status: DC | PRN

## 2020-01-14 ENCOUNTER — Encounter (HOSPITAL_COMMUNITY): Payer: Self-pay

## 2020-01-18 ENCOUNTER — Other Ambulatory Visit: Payer: Self-pay

## 2020-01-18 MED ORDER — CLOPIDOGREL BISULFATE 75 MG PO TABS: 75.0000 mg | ORAL_TABLET | Freq: Every day | ORAL | 3 refills | Status: DC

## 2020-01-19 ENCOUNTER — Encounter (HOSPITAL_COMMUNITY): Payer: Self-pay

## 2020-01-21 ENCOUNTER — Encounter (HOSPITAL_COMMUNITY): Payer: Self-pay

## 2020-01-26 ENCOUNTER — Telehealth: Payer: Self-pay

## 2020-01-26 NOTE — Telephone Encounter (Signed)
Called patient reference start date of next PREP class of 02/16/2020 will meet every tues/thur from 1p-215pm x 12 wks at [facility]. Patient agreeable. Intake scheduled for 2/16 at 3pm.
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
## 2020-02-10 ENCOUNTER — Encounter: Payer: Self-pay | Admitting: Emergency Medicine

## 2020-02-10 ENCOUNTER — Ambulatory Visit (INDEPENDENT_AMBULATORY_CARE_PROVIDER_SITE_OTHER): Payer: Medicare Other | Admitting: Emergency Medicine

## 2020-02-10 ENCOUNTER — Other Ambulatory Visit: Payer: Self-pay

## 2020-02-10 DIAGNOSIS — J439 Emphysema, unspecified: Secondary | ICD-10-CM

## 2020-02-10 DIAGNOSIS — J9612 Chronic respiratory failure with hypercapnia: Secondary | ICD-10-CM | POA: Diagnosis not present

## 2020-02-10 DIAGNOSIS — K219 Gastro-esophageal reflux disease without esophagitis: Secondary | ICD-10-CM

## 2020-02-10 DIAGNOSIS — J301 Allergic rhinitis due to pollen: Secondary | ICD-10-CM

## 2020-02-10 DIAGNOSIS — J9611 Chronic respiratory failure with hypoxia: Secondary | ICD-10-CM

## 2020-02-10 NOTE — Assessment & Plan Note (Signed)
Continue Protonix as you have been using it.

## 2020-02-10 NOTE — Assessment & Plan Note (Signed)
Continue Astelin nasal spray and Zyrtec as you have been taking them.

## 2020-02-10 NOTE — Assessment & Plan Note (Signed)
Please continue Symbicort 2 puffs twice a day.  Rinse and gargle after using. Continue Spiriva Respimat, 2 sprays once daily. Keep your albuterol available use 2 puffs if needed for shortness of breath, chest tightness, wheezing. Flu shot and Covid vaccines are up-to-date. Follow with Dr. Lamonte Sakai in 12 months or sooner if you have any problems

## 2020-02-10 NOTE — Patient Instructions (Addendum)
Please continue Symbicort 2 puffs twice a day.  Rinse and gargle after using. Continue Spiriva Respimat, 2 sprays once daily. Keep your albuterol available use 2 puffs if needed for shortness of breath, chest tightness, wheezing. Continue Astelin nasal spray and Zyrtec as you have been taking them. Continue Protonix as you have been using it. Continue your oxygen at 2 L/min at night while sleeping. Flu shot and Covid vaccines are up-to-date. Follow with Dr. Lamonte Sakai in 12 months or sooner if you have any problems.

## 2020-02-10 NOTE — Progress Notes (Signed)
   Subjective:    Patient ID: [name] Alcon., male    DOB: [birth year], 79 y.o.   MRN: [id number]  HPI 79 year old former smoker (25 pack years)  ROV 02/10/2020 --79 year old man followed here for COPD and recurrent right lower lobe pneumonias that were related to high aspiration risk following lap band surgery with hiatal hernia.  He also has hypertension with diastolic CHF, chronic rhinitis.  He reports today that he is doing well, no flares since last time. Breathing has been the same - still occasional bad days with changes in the weather, has increased SOB.  Managed on Spiriva and Symbicort.  Has albuterol which he uses when he exerts, long walks or carrying items. Uses it a few times a week. Gets his meds through the [facility] on protonix, zyrtec, astelin.  On O2 at night 2L/min.  He has had the COVID vaccine x 2.   Review of Systems As per HPI     Objective:   Physical Exam Vitals:   02/10/20 1558  BP: (!) 120/58  Pulse: 74  Temp: (!) 97.1 F (36.2 C)  TempSrc: Temporal  SpO2: 98%  Weight: 216 lb (98 kg)  Height: 5\' 3"  (1.6 m)   Gen: Pleasant, overwt, in no distress,  normal affect  ENT: No lesions,  mouth clear,  oropharynx clear, no postnasal drip  Neck: No JVD, no stridor  Lungs: No use of accessory muscles, distant, clear bilaterally without wheeze  CV: regular, no M gallops, trace peripheral edema  Musculoskeletal: No deformities, no cyanosis or clubbing  Neuro: alert, non focal  Skin: Warm, no lesions or rash       Assessment & Plan:  COPD (chronic obstructive pulmonary disease) with emphysema Please continue Symbicort 2 puffs twice a day.  Rinse and gargle after using. Continue Spiriva Respimat, 2 sprays once daily. Keep your albuterol available use 2 puffs if needed for shortness of breath, chest tightness, wheezing. Flu shot and Covid vaccines are up-to-date. Follow with Dr. Lamonte Sakai in 12 months or sooner if you have any problems  Allergic  rhinitis Continue Astelin nasal spray and Zyrtec as you have been taking them.  GERD (gastroesophageal reflux disease) Continue Protonix as you have been using it.  Chronic respiratory failure with hypoxia and hypercapnia (HCC) Continue your oxygen at 2 L/min at night while sleeping.  [name], MD, PhD 02/10/2020, 4:15 PM [facility] [phone] or if no answer [phone]

## 2020-02-10 NOTE — Assessment & Plan Note (Signed)
Continue your oxygen at 2 L/min at night while sleeping.

## 2020-02-16 NOTE — Progress Notes (Signed)
[facility] Weekly Session   Patient Details  Name: [name]. MRN: [id number] Date of Birth: 18-Nov-1941 Age: 79 y.o. PCP: [name], DO  There were no vitals filed for this visit.  [facility] Weekly seesion - 02/16/20 1500      Weekly Session   Topic Discussed  Goal setting and welcome to the program   scale of perceived exertion reviewed    Comments  given copy of 12wks strength training       Toured facility Reviewed goals Reviewed program 12 wks and plans for thursday   Barnett Hatter 02/16/2020, 3:19 PM

## 2020-02-19 ENCOUNTER — Ambulatory Visit (INDEPENDENT_AMBULATORY_CARE_PROVIDER_SITE_OTHER): Payer: Medicare Other | Admitting: Family Medicine

## 2020-02-19 ENCOUNTER — Other Ambulatory Visit: Payer: Self-pay

## 2020-02-19 ENCOUNTER — Encounter: Payer: Self-pay | Admitting: Family Medicine

## 2020-02-19 VITALS — BP 128/80 | HR 66 | Temp 97.0°F | Ht 63.0 in | Wt 218.8 lb

## 2020-02-19 DIAGNOSIS — K1120 Sialoadenitis, unspecified: Secondary | ICD-10-CM

## 2020-02-19 MED ORDER — AMOXICILLIN-POT CLAVULANATE 875-125 MG PO TABS: 1.0000 | ORAL_TABLET | Freq: Two times a day (BID) | ORAL | 0 refills | Status: DC

## 2020-02-19 NOTE — Patient Instructions (Signed)
Parotitis  Parotitis is inflammation of one or both of your parotid glands. These glands produce saliva. They are found on each side of your face, below and in front of your earlobes. The saliva that they produce comes out of tiny openings (ducts) inside your cheeks. Parotitis may cause sudden swelling and pain (acute parotitis). It can also cause repeated episodes of swelling and pain or continued swelling that may or may not be painful (chronic parotitis). What are the causes? This condition may be caused by:  Infections from bacteria.  Infections from viruses, such as mumps or HIV.  Blockage (obstruction) of saliva flow through the parotid glands. This can be from a stone, scar tissue, or a tumor.  Diseases that cause your body's defense system (immune system) to attack healthy cells in your salivary glands. These are called autoimmune diseases. What increases the risk? You are more likely to develop this condition if:  You are 50 years old or older.  You do not drink enough fluids (are dehydrated).  You drink too much alcohol.  You have: ? A dry mouth. ? Poor dental hygiene. ? Diabetes. ? Gout. ? A long-term illness.  You have had radiation treatments to the head and neck.  You take certain medicines. What are the signs or symptoms? Symptoms of this condition depend on the cause. Symptoms may include:  Swelling under and in front of the ear. This may get worse after eating.  Redness of the skin over the parotid gland.  Pain and tenderness over the parotid gland. This may get worse after eating.  Fever or chills.  Pus coming from the ducts inside the mouth.  Dry mouth.  A bad taste in the mouth. How is this diagnosed? This condition may be diagnosed based on:  Your medical history.  A physical exam.  Tests to find the cause of the parotitis. These may include: ? Doing blood tests to check for an autoimmune disease or infections from a virus. ? Taking a  fluid sample from the parotid gland and testing it for infection. ? Injecting the ducts of the parotid gland with a dye and then taking X-rays (sialogram). ? Having other imaging tests of the gland, such as X-rays, ultrasound, MRI, or CT scan. ? Checking the opening of the gland for a stone or obstruction. ? Placing a needle into the gland to remove tissue for a biopsy (fine needle aspiration). How is this treated? Treatment for this condition depends on the cause. Treatment may include:  Antibiotic medicine for a bacterial infection.  Drinking more fluids.  Removing a stone or obstruction.  Treating an underlying disease that is causing parotitis.  Surgery to drain an infection, remove a growth, or remove the whole gland (parotidectomy). Treatment may not be needed if parotid swelling goes away with home care. Follow these instructions at home: Medicines   Take over-the-counter and prescription medicines only as told by your health care provider.  If you were prescribed an antibiotic medicine, take it as told by your health care provider. Do not stop taking the antibiotic even if you start to feel better. Managing pain and swelling  If directed, apply heat to the affected area as often as told by your health care provider. Use the heat source that your health care provider recommends, such as a moist heat pack or a heating pad. To apply the heat: ? Place a towel between your skin and the heat source. ? Leave the heat on for 20-30 minutes. ?   Remove the heat if your skin turns bright red. This is especially important if you are unable to feel pain, heat, or cold. You may have a greater risk of getting burned.  Gargle with a salt-water mixture 3-4 times a day or as needed. To make a salt-water mixture, completely dissolve -1 tsp (3-6 g) of salt in 1 cup (237 mL) of warm water.  Gently massage the parotid glands as told by your health care provider. General instructions   Drink  enough fluid to keep your urine pale yellow.  Keep your mouth clean and moist.  Try sucking on sour candy. This may help to make your mouth less dry by stimulating the flow of saliva.  Maintain good oral health. ? Brush your teeth at least two times a day. ? Floss your teeth every day. ? See your dentist regularly.  Do not use any products that contain nicotine or tobacco, such as cigarettes, e-cigarettes, and chewing tobacco. If you need help quitting, ask your health care provider.  Do not drink alcohol.  Keep all follow-up visits as told by your health care provider. This is important. Contact a health care provider if:  You have a fever or chills.  You have new symptoms.  Your symptoms get worse.  Your symptoms do not improve with treatment. Get help right away if:  You have difficulty breathing or swallowing because of the swollen gland. Summary  Parotitis is inflammation of one or both of your parotid glands.  Symptoms include pain and swelling under and in front of the ear. They may also include a fever and a bad taste in your mouth.  This condition may be treated with antibiotics, increasing fluids, or surgery.  In some cases, parotitis may go away on its own without treatment.  You should drink plenty of fluids, maintain good oral hygiene, and avoid tobacco products. This information is not intended to replace advice given to you by your health care provider. Make sure you discuss any questions you have with your health care provider. Document Revised: 07/08/2018 Document Reviewed: 07/08/2018 [employer] Patient Education  2020 [employer]  
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
## 2020-02-23 ENCOUNTER — Other Ambulatory Visit: Payer: Self-pay

## 2020-02-23 NOTE — Progress Notes (Signed)
[place] Weekly Session   Patient Details  Name: Inderpreet Pomrenke. MRN: [id number] Date of Birth: 11/29/1941 Age: 79 y.o. PCP: [name], DO  Vitals:   02/23/20 1650  Weight: 215 lb (97.5 kg)    [facility] Weekly seesion - 02/23/20 1600      Weekly Session   Topic Discussed  Importance of resistance training;Other ways to be active    Minutes exercised this week  0 minutes    Classes attended to date  3      Fun things since last meeting: grocery shopping Nutrition celebration: eat more salads Barriers: too much TV   Did well today on scifit machine for about 20 min  Pam Tally Joe 02/23/2020, 4:51 PM

## 2020-02-25 ENCOUNTER — Other Ambulatory Visit: Payer: Self-pay

## 2020-02-25 MED ORDER — FUROSEMIDE 40 MG PO TABS: ORAL_TABLET | ORAL | 2 refills | Status: DC

## 2020-03-01 NOTE — Progress Notes (Signed)
[place] Weekly Session   Patient Details  Name: [name]. MRN: [id number] Date of Birth: 07-23-1941 Age: 79 y.o. PCP: [name], DO  Vitals:   03/01/20 1700  Weight: 214 lb (97.1 kg)    [employer] [facility] Weekly seesion - 03/01/20 1700      Weekly Session   Topic Discussed  Healthy eating tips   hidden sugar brochure    Minutes exercised this week  80 minutes   60 min cardio, 20 min of flexibility   Classes attended to date  5      Fun things you did since last meeting: read 3 books Grateful for: a lot Nutrition celebration: cut down on white bread, potatoes, etc Barriers/struggles: breathing, pollen season  Barnett Hatter 03/01/2020, 5:01 PM

## 2020-03-02 ENCOUNTER — Other Ambulatory Visit: Payer: Self-pay

## 2020-03-02 ENCOUNTER — Encounter: Payer: Self-pay | Admitting: Endocrinology

## 2020-03-02 ENCOUNTER — Ambulatory Visit (INDEPENDENT_AMBULATORY_CARE_PROVIDER_SITE_OTHER): Payer: Medicare Other | Admitting: Endocrinology

## 2020-03-02 VITALS — BP 122/70 | HR 75 | Ht 63.0 in | Wt 216.8 lb

## 2020-03-02 DIAGNOSIS — E1151 Type 2 diabetes mellitus with diabetic peripheral angiopathy without gangrene: Secondary | ICD-10-CM | POA: Diagnosis not present

## 2020-03-02 DIAGNOSIS — Z794 Long term (current) use of insulin: Secondary | ICD-10-CM

## 2020-03-02 LAB — POCT GLYCOSYLATED HEMOGLOBIN (HGB A1C): Hemoglobin A1C: 6.4 % — AB (ref 4.0–5.6)

## 2020-03-02 MED ORDER — INSULIN ASPART 100 UNIT/ML ~~LOC~~ SOLN: SUBCUTANEOUS | 11 refills | Status: DC
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
## 2020-03-02 NOTE — Patient Instructions (Signed)
Please reduce the supper Novolog to 10 units, and: Please continue the same other medications Blood tests are requested for you today.  We'll let you know about the results.  check your blood sugar twice a day.  vary the time of day when you check, between before the 3 meals, and at bedtime.  also check if you have symptoms of your blood sugar being too high or too low.  please keep a record of the readings and bring it to your next appointment here (or you can bring the meter itself).  You can write it on any piece of paper.  please call us sooner if your blood sugar goes below 70, or if you have a lot of readings over 200. Please come back for a follow-up appointment in 3 months.

## 2020-03-03 LAB — BASIC METABOLIC PANEL
BUN: 34 mg/dL — ABNORMAL HIGH (ref 6–23)
CO2: 33 mEq/L — ABNORMAL HIGH (ref 19–32)
Calcium: 9.3 mg/dL (ref 8.4–10.5)
Chloride: 96 mEq/L (ref 96–112)
Creatinine, Ser: 1.44 mg/dL (ref 0.40–1.50)
GFR: 47.47 mL/min — ABNORMAL LOW (ref >60.00)
Glucose, Bld: 207 mg/dL — ABNORMAL HIGH (ref 70–99)
Potassium: 4.2 mEq/L (ref 3.5–5.1)
Sodium: 135 mEq/L (ref 135–145)

## 2020-03-04 ENCOUNTER — Other Ambulatory Visit: Payer: Self-pay | Admitting: Family Medicine

## 2020-03-04 NOTE — Telephone Encounter (Signed)
Last OV 02/19/20 Last fill 03/05/19  #90/3

## 2020-03-08 NOTE — Progress Notes (Signed)
[place] Weekly Session   Patient Details  Name: [name]. MRN: [id number] Date of Birth: 1941/06/18 Age: 79 y.o. PCP: [name], DO  Vitals:   03/08/20 1809  Weight: 214 lb (97.1 kg)    [employer] Weekly seesion - 03/08/20 1800      Weekly Session   Topic Discussed  Health habits    Minutes exercised this week  80 minutes   cardio 20, strength 30, flex 30    Classes attended to date  7      Fun things since last meeting: read a book Nutrition celebration: cut down on bread Barriers/struggles: walking   Barnett Hatter 03/08/2020, 6:10 PM

## 2020-03-15 ENCOUNTER — Encounter: Payer: Self-pay | Admitting: *Deleted
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
## 2020-03-15 NOTE — Progress Notes (Signed)
[facility] Weekly Session   Patient Details  Name: [name]. MRN: [id number] Date of Birth: 1941-04-23 Age: 79 y.o. PCP: [name], DO  Vitals:   03/15/20 1631  Weight: 215 lb (97.5 kg)    [facility] Weekly seesion - 03/15/20 1600      Weekly Session   Topic Discussed  Restaurant Eating   salt demo   Minutes exercised this week  0 minutes   did not chart class time   Classes attended to date  49      Grateful for everything Nutrition celebrations: none Barriers/struggles: bread  Barnett Hatter 03/15/2020, 4:33 PM

## 2020-03-16 ENCOUNTER — Encounter: Payer: Self-pay | Admitting: *Deleted

## 2020-03-16 ENCOUNTER — Ambulatory Visit (INDEPENDENT_AMBULATORY_CARE_PROVIDER_SITE_OTHER): Payer: Medicare Other | Admitting: *Deleted

## 2020-03-16 VITALS — BP 126/67 | HR 70 | Wt 215.0 lb

## 2020-03-16 DIAGNOSIS — Z0000 Encounter for general adult medical examination without abnormal findings: Secondary | ICD-10-CM | POA: Diagnosis not present

## 2020-03-16 NOTE — Patient Instructions (Signed)
Please schedule your next medicare wellness visit with me in 1 yr.  Continue to eat heart healthy diet (full of fruits, vegetables, whole grains, lean protein, water--limit salt, fat, and sugar intake) and increase physical activity as tolerated.  Continue doing brain stimulating activities (puzzles, reading, adult coloring books, staying active) to keep memory sharp.    [name] , Thank you for taking time to come for your Medicare Wellness Visit. I appreciate your ongoing commitment to your health goals. Please review the following plan we discussed and let me know if I can assist you in the future.   These are the goals we discussed: Goals    . maintain health       This is a list of the screening recommended for you and due dates:  Health Maintenance  Topic Date Due  . Urine Protein Check  12/24/2017  . Complete foot exam   02/03/2020  . Eye exam for diabetics  08/06/2020  . Hemoglobin A1C  09/02/2020  . Tetanus Vaccine  12/25/2023  . Flu Shot  Completed  . Pneumonia vaccines  Completed    Preventive Care 1 Years and Older, Male Preventive care refers to lifestyle choices and visits with your health care provider that can promote health and wellness. This includes:  A yearly physical exam. This is also called an annual well check.  Regular dental and eye exams.  Immunizations.  Screening for certain conditions.  Healthy lifestyle choices, such as diet and exercise. What can I expect for my preventive care visit? Physical exam Your health care provider will check:  Height and weight. These may be used to calculate body mass index (BMI), which is a measurement that tells if you are at a healthy weight.  Heart rate and blood pressure.  Your skin for abnormal spots. Counseling Your health care provider may ask you questions about:  Alcohol, tobacco, and drug use.  Emotional well-being.  Home and relationship well-being.  Sexual activity.  Eating  habits.  History of falls.  Memory and ability to understand (cognition).  Work and work Statistician. What immunizations do I need?  Influenza (flu) vaccine  This is recommended every year. Tetanus, diphtheria, and pertussis (Tdap) vaccine  You may need a Td booster every 10 years. Varicella (chickenpox) vaccine  You may need this vaccine if you have not already been vaccinated. Zoster (shingles) vaccine  You may need this after age 61. Pneumococcal conjugate (PCV13) vaccine  One dose is recommended after age 49. Pneumococcal polysaccharide (PPSV23) vaccine  One dose is recommended after age 62. Measles, mumps, and rubella (MMR) vaccine  You may need at least one dose of MMR if you were born in 1957 or later. You may also need a second dose. Meningococcal conjugate (MenACWY) vaccine  You may need this if you have certain conditions. Hepatitis A vaccine  You may need this if you have certain conditions or if you travel or work in places where you may be exposed to hepatitis A. Hepatitis B vaccine  You may need this if you have certain conditions or if you travel or work in places where you may be exposed to hepatitis B. Haemophilus influenzae type b (Hib) vaccine  You may need this if you have certain conditions. You may receive vaccines as individual doses or as more than one vaccine together in one shot (combination vaccines). Talk with your health care provider about the risks and benefits of combination vaccines. What tests do I need? Blood tests  Lipid and cholesterol levels. These may be checked every 5 years, or more frequently depending on your overall health.  Hepatitis C test.  Hepatitis B test. Screening  Lung cancer screening. You may have this screening every year starting at age 69 if you have a 30-pack-year history of smoking and currently smoke or have quit within the past 15 years.  Colorectal cancer screening. All adults should have this  screening starting at age 64 and continuing until age 20. Your health care provider may recommend screening at age 76 if you are at increased risk. You will have tests every 1-10 years, depending on your results and the type of screening test.  Prostate cancer screening. Recommendations will vary depending on your family history and other risks.  Diabetes screening. This is done by checking your blood sugar (glucose) after you have not eaten for a while (fasting). You may have this done every 1-3 years.  Abdominal aortic aneurysm (AAA) screening. You may need this if you are a current or former smoker.  Sexually transmitted disease (STD) testing. Follow these instructions at home: Eating and drinking  Eat a diet that includes fresh fruits and vegetables, whole grains, lean protein, and low-fat dairy products. Limit your intake of foods with high amounts of sugar, saturated fats, and salt.  Take vitamin and mineral supplements as recommended by your health care provider.  Do not drink alcohol if your health care provider tells you not to drink.  If you drink alcohol: ? Limit how much you have to 0-2 drinks a day. ? Be aware of how much alcohol is in your drink. In the [place] one drink equals one 12 oz bottle of beer (355 mL), one 5 oz glass of wine (148 mL), or one 1 oz glass of hard liquor (44 mL). Lifestyle  Take daily care of your teeth and gums.  Stay active. Exercise for at least 30 minutes on 5 or more days each week.  Do not use any products that contain nicotine or tobacco, such as cigarettes, e-cigarettes, and chewing tobacco. If you need help quitting, ask your health care provider.  If you are sexually active, practice safe sex. Use a condom or other form of protection to prevent STIs (sexually transmitted infections).  Talk with your health care provider about taking a low-dose aspirin or statin. What's next?  Visit your health care provider once a year for a well check  visit.  Ask your health care provider how often you should have your eyes and teeth checked.  Stay up to date on all vaccines. This information is not intended to replace advice given to you by your health care provider. Make sure you discuss any questions you have with your health care provider. Document Revised: 12/04/2018 Document Reviewed: 12/04/2018 Elsevier Patient Education  2020 [employer].

## 2020-03-23 ENCOUNTER — Telehealth: Payer: Self-pay | Admitting: Family Medicine

## 2020-03-23 DIAGNOSIS — E1151 Type 2 diabetes mellitus with diabetic peripheral angiopathy without gangrene: Secondary | ICD-10-CM

## 2020-03-23 DIAGNOSIS — I10 Essential (primary) hypertension: Secondary | ICD-10-CM

## 2020-03-23 NOTE — Progress Notes (Signed)
  Chronic Care Management   Note  03/23/2020 Name: Marshaun Marrese. MRN: [id number] DOB: [birth year]  Eli Wisse. is a 79 y.o. year old male who is a primary care patient of [name], DO. I reached out to [name]. by phone today in response to a referral sent by Mr. Himanshu Chrestman Jr.'s PCP, [name], DO.   Mr. Widmaier was given information about Chronic Care Management services today including:  1. CCM service includes personalized support from designated clinical staff supervised by his physician, including individualized plan of care and coordination with other care providers 2. 24/7 contact phone numbers for assistance for urgent and routine care needs. 3. Service will only be billed when office clinical staff spend 20 minutes or more in a month to coordinate care. 4. Only one practitioner may furnish and bill the service in a calendar month. 5. The patient may stop CCM services at any time (effective at the end of the month) by phone call to the office staff.   Patient agreed to services and verbal consent obtained.   Follow up plan:   Earney [place] [employer] Scheduler

## 2020-03-23 NOTE — Progress Notes (Signed)
[facility] Weekly Session   Patient Details  Name: [name]. MRN: [id number] Date of Birth: Jul 03, 1941 Age: 79 y.o. PCP: [name], DO  Vitals:   03/22/20 1347  Weight: 215 lb (97.5 kg)    [facility] Weekly seesion - 03/23/20 1000      Weekly Session   Topic Discussed  Stress management and problem solving   chair yoga and breathwork   Minutes exercised this week  80 minutes    Classes attended to date  60      Grateful for: everything Barriers/struggles: food   Barnett Hatter 03/23/2020, 10:48 AM

## 2020-03-24 NOTE — Addendum Note (Signed)
Addended by: Darral Dash on: 03/24/2020 11:45 AM   Modules accepted: Orders

## 2020-03-29 NOTE — Progress Notes (Signed)
[facility] Weekly Session   Patient Details  Name: [name]. MRN: [id number] Date of Birth: 05-11-1941 Age: 79 y.o. PCP: [name], DO  Vitals:   03/29/20 1608  Weight: 215 lb (97.5 kg)    [facility] Weekly seesion - 03/29/20 1600      Weekly Session   Topic Discussed  Expectations and non-scale victories   sugar demo   Minutes exercised this week  20 minutes   did not chart all time exercised   Classes attended to date  18      Grateful for: everything Nutrition celebration: ate more green veggies Barriers/struggles: food portions   Barnett Hatter 03/29/2020, 4:09 PM

## 2020-03-30 ENCOUNTER — Ambulatory Visit: Payer: Medicare Other

## 2020-03-30 DIAGNOSIS — E118 Type 2 diabetes mellitus with unspecified complications: Secondary | ICD-10-CM

## 2020-03-30 DIAGNOSIS — I10 Essential (primary) hypertension: Secondary | ICD-10-CM

## 2020-03-30 NOTE — Chronic Care Management (AMB) (Signed)
Chronic Care Management Pharmacy  Name: [name].  MRN: [id number] DOB: 1941-12-24  Chief Complaint/ HPI  [name].,  79 y.o. , male presents for their Initial CCM visit with the clinical pharmacist In office.  PCP : [name], DO  Their chronic conditions include: hypertension, chronic heart failure, COPD, type 2 diabetes, hyperlipidemia, GERD, osteoarthritis, osteoporosis, gout  Office Visits: 03/16/20: [facility] with [name], RN for AWV. No medication changes noted at that time.  02/19/20: Patient presented for ear pain/parotitis. Patient started on Augmentin for 10 days.  Consult Visit: 03/02/20: Patient presented to Dr. [name] (endocrinology) for DM follow-up. A1c improved from 6.8% to 6.4%. eGFR stable at 47 mL/min. BG range  66-200. Novolog decreased to 9u/9u/10u/5u. Follow-up in 3 months.  02/10/20: Patient presented to Dr. [name] (pulmonology) for pulmonary emphysema. Breathing stable, only reports using his albuterol inhaler a few times weekly with exertion on long walks or when carrying items. No medication changes made.  12/30/19: Patient presented to Dr. [name] (endocrinology) for DM follow-up. A1c improved from 7.1% to 6.8%. BG range 41-302. Patient started on Dapagliflozin 2.5 mg daily, furosemide decreased to 120 mg daily, and Novolog decreased to 9u/9u/14u/5u. 11/02/19: Patient presented to Dr. [name] (Cardiology) for CAD/HF follow-up.  patient denies any chest pain, dyspnea, palpitations, lower extremity edema, orthopnea, PND, dizziness, near syncope or syncope, no medication changes made. Follow-up in one year.  Medications: Outpatient Encounter Medications as of 03/30/2020  Medication Sig  . albuterol (VENTOLIN HFA) 108 (90 Base) MCG/ACT inhaler Inhale 2 puffs into the lungs every 6 (six) hours as needed for wheezing or shortness of breath.  . allopurinol (ZYLOPRIM) 300 MG tablet TAKE 1 TABLET(300 MG) BY MOUTH  DAILY  . aspirin 81 MG tablet Take 81 mg by mouth daily.    Marland Kitchen azelastine (ASTELIN) 0.1 % nasal spray Place 2 sprays into both nostrils 2 (two) times daily as needed for rhinitis. Use in each nostril as directed  . B Complex Vitamins (VITAMIN B COMPLEX PO) Take 1 tablet by mouth daily.  . budesonide-formoterol (SYMBICORT) 160-4.5 MCG/ACT inhaler Inhale 2 puffs into the lungs 2 (two) times daily.  . calcitRIOL (ROCALTROL) 0.25 MCG capsule Take 1 capsule (0.25 mcg total) by mouth daily.  . carvedilol (COREG) 25 MG tablet Take 1 tablet (25 mg total) by mouth 2 (two) times daily with a meal.  . cetirizine (ZYRTEC) 10 MG tablet Take 10 mg by mouth as needed for allergies.   . cholecalciferol (VITAMIN D3) 25 MCG (1000 UNIT) tablet Take 1,000 Units by mouth daily.  . citalopram (CELEXA) 10 MG tablet TAKE 1 TABLET(10 MG) BY MOUTH DAILY  . clopidogrel (PLAVIX) 75 MG tablet Take 1 tablet (75 mg total) by mouth daily with breakfast.  . dapagliflozin propanediol (FARXIGA) 5 MG TABS tablet Take 2.5 mg by mouth daily.  . diphenhydrAMINE (BENADRYL) 25 MG tablet Take 25 mg by mouth every 6 (six) hours as needed.  . finasteride (PROSCAR) 5 MG tablet Take 1 tablet (5 mg total) by mouth daily.  . furosemide (LASIX) 40 MG tablet Take 3 tablets by mouth every morning and 2 tablets every afternoon. (Patient taking differently: Take 3 tablets by mouth every morning)  . GuaiFENesin (MUCINEX MAXIMUM STRENGTH PO) Take 1 Dose by mouth as needed (drainage).  . insulin aspart (NOVOLOG) 100 UNIT/ML injection 4 times a day (just before each meal) 09-01-09-5 (if HS snack) units.  . insulin NPH Human (HUMULIN N) 100  UNIT/ML injection Inject 0.03 mLs (3 Units total) into the skin at bedtime. And syringes 1/day  . isosorbide mononitrate (IMDUR) 60 MG 24 hr tablet Take 1 tablet (60 mg total) by mouth daily.  . pantoprazole (PROTONIX) 40 MG tablet Take 1 tablet (40 mg total) by mouth daily.  . polyethylene glycol powder (MIRALAX)  powder Take 17 g by mouth daily as needed (constipation).   . potassium chloride (K-DUR) 10 MEQ tablet Take 1 tablet (10 mEq total) by mouth daily.  . rosuvastatin (CRESTOR) 10 MG tablet Take 1 tablet (10 mg total) by mouth daily.  . Tiotropium Bromide Monohydrate (SPIRIVA RESPIMAT) 2.5 MCG/ACT AERS Inhale 2 puffs into the lungs daily.  . traZODone (DESYREL) 100 MG tablet TAKE 1 TABLET(100 MG) BY MOUTH AT BEDTIME  . AMBULATORY NON FORMULARY MEDICATION O2 @ 2LMP @ night  . ketoconazole (NIZORAL) 2 % cream Apply 1 application topically daily as needed for irritation.   . Multiple Vitamins-Minerals (MULTIVITAMIN PO) Take 1 tablet by mouth daily.    . nitroGLYCERIN (NITROLINGUAL) 0.4 MG/SPRAY spray Place 1 spray under the tongue as directed. (Patient not taking: Reported on 03/30/2020)  . Protein (UNJURY UNFLAVORED PO) Take 8-16 oz by mouth daily.   Marland Kitchen triamcinolone (KENALOG) 0.025 % cream Apply 1 application topically 3 (three) times daily as needed (skin).   No facility-administered encounter medications on file as of 03/30/2020.     Current Diagnosis/Assessment:  Goals Addressed            This Visit's Progress   . Chronic Care Management       CARE PLAN ENTRY  Current Barriers:  . Chronic Disease Management support, education, and care coordination needs related to hypertension, chronic heart failure, COPD, type 2 diabetes, hyperlipidemia, GERD, osteoarthritis, osteoporosis, gout  Clinical Goal(s): Over the next 30 days, patient will:  . Work with the care management team to address educational, disease management, and care coordination needs  . Begin or continue self health monitoring activities as directed today  . Call provider office for new or worsened signs and symptoms  . Call care management team with questions or concerns . Maintain A1c less than 7%  . Maintain blood pressure less than 140/90 . Achieve LDL (Bad cholesterol) less than 70   Interventions:  . Evaluation of  current treatment plans and patient's adherence to plan as established by provider . Assessed patient understanding of disease states . Assessed patient's education and care coordination needs . Provided disease specific education to patient  . Recommend decreasing pantoprazole to every other day use. If heartburn or reflux symptoms return resume taking pantoprazole daily   Face to Face appointment with pharmacist scheduled for: 04/25/20 at 3:00 PM       COPD w/ Emphysema   Last spirometry score: n/a  Gold Grade: Gold 3 (FEV1 30-49%) Current COPD Classification:  B (high sx, <2 exacerbations/yr)   History of recurrent right lower lobe pneumonias   Eosinophil count:   Lab Results  Component Value Date/Time   EOSPCT 3.3 08/29/2018 02:34 PM  %                               Eos (Absolute):  Lab Results  Component Value Date/Time   EOSABS 0.3 08/29/2018 02:34 PM    Tobacco Status:  Social History   Tobacco Use  Smoking Status Former Smoker  . Packs/day: 2.00  . Years: 41.00  . Pack  years: 82.00  . Types: Cigarettes  . Quit date: 12/24/1997  . Years since quitting: 22.2  Smokeless Tobacco Never Used    Patient has failed these meds in past: n/a Patient is currently controlled on the following medications:   Ventolin HFA 108 mcg 2 puff q6hr PRN   Symbicort 160-4.5 mcg/act 2 puff BID (sometimes only takes daily)   Spiriva 2.5 mcg 2 puff daily   O2 2L at night   Using maintenance inhaler regularly? Yes Frequency of rescue inhaler use:  infrequently once in past 4 months.   We discussed:  proper inhaler technique. Patient sometimes forgets to take his second dose of Symbicort. Otherwise doing fine other than some shortness of breath after exerting himself. He gets his Symbicort and Spiriva through the [facility] (3 month supply costs $11)  Plan  Continue current medications  ,  Diabetes   Managed by Dr. [name] Regularly [facility] classes at the [facility]  Recent  Relevant Labs: Lab Results  Component Value Date/Time   HGBA1C 6.4 (A) 03/02/2020 04:10 PM   HGBA1C 6.8 (A) 12/30/2019 01:33 PM   HGBA1C 7.1 12/29/2019 12:00 AM   HGBA1C 6.9 (H) 05/11/2015 01:56 PM   HGBA1C 7.0 (H) 02/10/2015 09:33 AM   MICROALBUR 38.0 (H) 02/10/2015 09:33 AM   MICROALBUR 29.8 (H) 02/10/2014 03:00 PM     Checking BG: 3x per Day  Recent FBG Readings: 173, 188, 189, 97 (Average 162) Recent pre-meal BG readings: 163, 166, 236, 175 (Average 185) Recent 2hr PP BG readings: n/a Recent HS BG readings: 225,218, 199 (Average 224)  Patient has failed these meds in past: acarbose, nateglinide,  Patient is currently controlled on the following medications:   Farxiga 2.5 mg daily   Novolog Injection 9 units/ 9 units/ 10 units/ 5 units (HS snack)  NPH injection 3 units at bedtime   Last diabetic Foot exam:  Lab Results  Component Value Date/Time   HMDIABEYEEXA No Retinopathy 08/07/2019 12:00 AM    Last diabetic Eye exam: No results found for: HMDIABFOOTEX   We discussed: diet and exercise extensively. Patient reports two lows in past month (3/28 76), (3/24 83) (3/6 91). Has a tendency to overreact, and correct with mountain dew. Denies symptoms of hypoglycemia recently.   Current Exercise Habits: Structured exercise class, Type of exercise: stretching;walking;strength training/weights, Time (Minutes): 60, Frequency (Times/Week): 2, Weekly Exercise (Minutes/Week): 120   Diet (meal preparation, eat out, water intake, caffeinated beverages, dairy products, fruits and vegetables): Breakfast: protein drink Lunch: pb toast Dinner:   Steak , spinach, potatoes  Patient currently getting [place] at [facility], as not on the [place].   Plan Recommend switching patient from [place] 2.5 mg daily to Jardiance 5 mg daily as this is covered under his [place] and would save him ~$100 monthly Continue current medications Heart Failure   Managed by Dr. Lauree Chandler    Type: Diastolic  Last ejection fraction: 55-60%  NYHA Class: II (slight limitation of activity) AHA HF Stage: C (Heart disease and symptoms present)  Patient has failed these meds in past: bisoprolol, diltiazem, metolzaone, midodrine, spironolactone, enalapril/lisinopril (cough)  Patient is currently controlled on the following medications:   Carvedilol 25 mg BID   Furosemide 120 mg AM   Imdur 60 mg daily (AM)   We discussed: patient weighing himself regularly. Since starting Fargixa and decreasing furosemide, he reports he has actually lost a few pounds.  Plan  Continue current medications Hypertension   Office blood  pressures are  BP Readings from Last 3 Encounters:  03/16/20 126/67  03/02/20 122/70  02/19/20 128/80    Patient has failed these meds in the past: n/a Patient is currently controlled on the following medications:   Carvedilol 25 mg BID   Furosemide 120 mg AM  Imdur 60 mg daily   Patient checks BP at home infrequently  Patient home BP readings are ranging: n/a  We discussed diet and exercise extensively  Plan  Continue current medications   Hyperlipidemia   History of CAD requiring multiple PCI and history of PAD.   Lipid Panel     Component Value Date/Time   CHOL 137 12/29/2019 0000   TRIG 71 12/29/2019 0000   HDL 54 12/29/2019 0000   CHOLHDL 3 08/29/2018 1434   VLDL 19.8 08/29/2018 1434   LDLCALC 84 12/29/2019 0000     The ASCVD Risk score Mikey Bussing DC Jr., et al., 2013) failed to calculate for the following reasons:   The patient has a prior MI or stroke diagnosis   Patient has failed these meds in past: n/a Patient is currently uncontrolled on the following medications:   Aspirin 81 mg daily (supper)   Clopidogrel 75 mg daily  NG PRN  Rosuvastatin 10 mg daily (evening meal) LDL Goal <70  We discussed:  diet and exercise extensively. Denies unusual bruising/bleeding.  Plan Patient would likely benefit from increasing his  rosuvastatin to 20 mg daily to achieve LDL <70. Will address at future visit.  Continue current medications   Gout   Uric acid (08/29/18): 4.4  Patient has failed these meds in past: n/a Patient is currently controlled on the following medications:   Allopurinol 300 mg daily (AM)  We discussed:  Last flare was a couple of years ago  Plan  Continue current medications  Mood   Patient has failed these meds in past: n/a Patient is currently controlled on the following medications:  Citalopram 10 mg daily (AM)  We discussed: Patient reports mood steady, tolerating citalopram well.  Plan  Continue current medications  GERD   Patient has failed these meds in past: n/a Patient is currently controlled on the following medications:   Pantoprazole 40 mg daily   We discussed: Patient has not had GERD symptoms in a very long time. Was interested in seeing if he could stop that medicine, although he was concerned about rebound symptoms.  Plan Recommend decreasing pantoprazole to 40 mg every other day.    Misc/OTC    Azelastin 0.1% nasal spray  Calcitriol 0.25 mcg daily (AM) Cetirizine 10 mg daily PRN  Diphenhydramine 25 mg q6hr PRN (Takes at night)  Finasteride 5 mg daily Guaifenesin daily PRN  Ketoconazole 2% cream Multivitamin daily  Miralax 17 g daily as needed  Potassium chloride 10 mEq daily (AM)  Trazodone 100 mg QHS Triamcinolone 0.025% cream   Plan  Continue current medications  Medication Management   Pt uses [facility] for most of his medications, although he gets a few at the [facility] to save on costs. Does not have a pillbox, keeps his medicines in a "golf shoebag" and takes them out when he takes them. States he is very regimented about his medications and does not recall missing any doses recently.   Follow up: 1 month   Doristine Section [phone]

## 2020-03-31 ENCOUNTER — Other Ambulatory Visit: Payer: Self-pay | Admitting: Endocrinology

## 2020-03-31 MED ORDER — JARDIANCE 10 MG PO TABS: 5.0000 mg | ORAL_TABLET | Freq: Every day | ORAL | 3 refills | Status: DC

## 2020-03-31 NOTE — Patient Instructions (Addendum)
Visit Information It was speaking with you today. Please let me know if you have any questions about our visit.  Goals Addressed            This Visit's Progress   . Chronic Care Management       CARE PLAN ENTRY  Current Barriers:  . Chronic Disease Management support, education, and care coordination needs related to hypertension, chronic heart failure, COPD, type 2 diabetes, hyperlipidemia, GERD, osteoarthritis, osteoporosis, gout  Clinical Goal(s): Over the next 30 days, patient will:  . Work with the care management team to address educational, disease management, and care coordination needs  . Begin or continue self health monitoring activities as directed today  . Call provider office for new or worsened signs and symptoms  . Call care management team with questions or concerns . Maintain A1c less than 7%  . Maintain blood pressure less than 140/90 . Achieve LDL (Bad cholesterol) less than 70   Interventions:  . Evaluation of current treatment plans and patient's adherence to plan as established by provider . Assessed patient understanding of disease states . Assessed patient's education and care coordination needs . Provided disease specific education to patient  . Recommend decreasing pantoprazole to every other day use. If heartburn or reflux symptoms return resume taking pantoprazole daily   Face to Face appointment with pharmacist scheduled for: 04/25/20 at 3:00 PM       [name] was given information about Chronic Care Management services today including:  1. CCM service includes personalized support from designated clinical staff supervised by his physician, including individualized plan of care and coordination with other care providers 2. 24/7 contact phone numbers for assistance for urgent and routine care needs. 3. Standard insurance, coinsurance, copays and deductibles apply for chronic care management only during months in which we provide at least 20  minutes of these services. Most insurances cover these services at 100%, however patients may be responsible for any copay, coinsurance and/or deductible if applicable. This service may help you avoid the need for more expensive face-to-face services. 4. Only one practitioner may furnish and bill the service in a calendar month. 5. The patient may stop CCM services at any time (effective at the end of the month) by phone call to the office staff.  Patient agreed to services and verbal consent obtained.   The patient verbalized understanding of instructions provided today and agreed to receive a mailed copy of patient instruction and/or educational materials. Face to Face appointment with pharmacist scheduled for:  04/25/20  Doristine Section [phone]  Hypoglycemia Hypoglycemia is when the sugar (glucose) level in your blood is too low. Signs of low blood sugar may include:  Feeling: ? Hungry. ? Worried or nervous (anxious). ? Sweaty and clammy. ? Confused. ? Dizzy. ? Sleepy. ? Sick to your stomach (nauseous).  Having: ? A fast heartbeat. ? A headache. ? A change in your vision. ? Tingling or no feeling (numbness) around your mouth, lips, or tongue. ? Jerky movements that you cannot control (seizure).  Having trouble with: ? Moving (coordination). ? Sleeping. ? Passing out (fainting). ? Getting upset easily (irritability). Low blood sugar can happen to people who have diabetes and people who do not have diabetes. Low blood sugar can happen quickly, and it can be an emergency. Treating low blood sugar Low blood sugar is often treated by eating or drinking something sugary right away, such as:  Fruit juice, 4-6 oz (120-150 mL).  Regular soda (not  diet soda), 4-6 oz (120-150 mL).  Low-fat milk, 4 oz (120 mL).  Several pieces of hard candy.  Sugar or honey, 1 Tbsp (15 mL). Treating low blood sugar if you have diabetes If you can think clearly and swallow safely, follow  the 15:15 rule:  Take 15 grams of a fast-acting carb (carbohydrate). Talk with your doctor about how much you should take.  Always keep a source of fast-acting carb with you, such as: ? Sugar tablets (glucose pills). Take 3-4 pills. ? 6-8 pieces of hard candy. ? 4-6 oz (120-150 mL) of fruit juice. ? 4-6 oz (120-150 mL) of regular (not diet) soda. ? 1 Tbsp (15 mL) honey or sugar.  Check your blood sugar 15 minutes after you take the carb.  If your blood sugar is still at or below 70 mg/dL (3.9 mmol/L), take 15 grams of a carb again.  If your blood sugar does not go above 70 mg/dL (3.9 mmol/L) after 3 tries, get help right away.  After your blood sugar goes back to normal, eat a meal or a snack within 1 hour.  Treating very low blood sugar If your blood sugar is at or below 54 mg/dL (3 mmol/L), you have very low blood sugar (severe hypoglycemia). This may also cause:  Passing out.  Jerky movements you cannot control (seizure).  Losing consciousness (coma). This is an emergency. Do not wait to see if the symptoms will go away. Get medical help right away. Call your local emergency services (911 in the [place] Do not drive yourself to the hospital. If you have very low blood sugar and you cannot eat or drink, you may need a glucagon shot (injection). A family member or friend should learn how to check your blood sugar and how to give you a glucagon shot. Ask your doctor if you need to have a glucagon shot kit at home. Follow these instructions at home: General instructions  Take over-the-counter and prescription medicines only as told by your doctor.  Stay aware of your blood sugar as told by your doctor.  Limit alcohol intake to no more than 1 drink a day for nonpregnant women and 2 drinks a day for men. One drink equals 12 oz of beer (355 mL), 5 oz of wine (148 mL), or 1 oz of hard liquor (44 mL).  Keep all follow-up visits as told by your doctor. This is important. If you have  diabetes:   Follow your diabetes care plan as told by your doctor. Make sure you: ? Know the signs of low blood sugar. ? Take your medicines as told. ? Follow your exercise and meal plan. ? Eat on time. Do not skip meals. ? Check your blood sugar as often as told by your doctor. Always check it before and after exercise. ? Follow your sick day plan when you cannot eat or drink normally. Make this plan ahead of time with your doctor.  Share your diabetes care plan with: ? Your work or school. ? People you live with.  Check your pee (urine) for ketones: ? When you are sick. ? As told by your doctor.  Carry a card or wear jewelry that says you have diabetes. Contact a doctor if:  You have trouble keeping your blood sugar in your target range.  You have low blood sugar often. Get help right away if:  You still have symptoms after you eat or drink something sugary.  Your blood sugar is at or below 54  mg/dL (3 mmol/L).  You have jerky movements that you cannot control.  You pass out. These symptoms may be an emergency. Do not wait to see if the symptoms will go away. Get medical help right away. Call your local emergency services (911 in the [place] Do not drive yourself to the hospital. Summary  Hypoglycemia happens when the level of sugar (glucose) in your blood is too low.  Low blood sugar can happen to people who have diabetes and people who do not have diabetes. Low blood sugar can happen quickly, and it can be an emergency.  Make sure you know the signs of low blood sugar and know how to treat it.  Always keep a source of sugar (fast-acting carb) with you to treat low blood sugar. This information is not intended to replace advice given to you by your health care provider. Make sure you discuss any questions you have with your health care provider. Document Revised: 04/02/2019 Document Reviewed: 01/13/2016 Elsevier Patient Education  2020 [employer].

## 2020-04-01 ENCOUNTER — Telehealth: Payer: Self-pay

## 2020-04-01 ENCOUNTER — Other Ambulatory Visit: Payer: Self-pay

## 2020-04-01 DIAGNOSIS — E1151 Type 2 diabetes mellitus with diabetic peripheral angiopathy without gangrene: Secondary | ICD-10-CM

## 2020-04-01 DIAGNOSIS — Z794 Long term (current) use of insulin: Secondary | ICD-10-CM

## 2020-04-01 MED ORDER — JARDIANCE 10 MG PO TABS: 5.0000 mg | ORAL_TABLET | Freq: Every day | ORAL | 3 refills | Status: DC

## 2020-04-01 NOTE — Telephone Encounter (Signed)
Patient called today stating jardiance was sent to the wrong pharmacy it needs to be faxed to the [facility] [place] at [phone] would like this done quickly he is almost out

## 2020-04-01 NOTE — Telephone Encounter (Signed)
FAXED [facility]  Document: Rx for [employer] Other records requested: None  Rx has been faxed successfully to the [facility] [place] at the fax # indicated below. Documents and fax confirmation have been placed in the faxed file for future reference.

## 2020-04-05 NOTE — Progress Notes (Signed)
[facility] Weekly Session   Patient Details  Name: [name]. MRN: [id number] Date of Birth: Mar 11, 1941 Age: 79 y.o. PCP: [name], DO  Vitals:   04/05/20 1623  Weight: 212 lb (96.2 kg)    [facility] Weekly seesion - 04/05/20 1600      Weekly Session   Topic Discussed  --   portion control   Minutes exercised this week  30 minutes    Classes attended to date  64      Grateful for: everthing Nutrition celebration: cut down on snacks Barriers/struggles: snacks  Barnett Hatter 04/05/2020, 4:24 PM

## 2020-04-19 NOTE — Progress Notes (Signed)
[facility] Weekly Session   Patient Details  Name: [name]. MRN: [id number] Date of Birth: 02/18/1941 Age: 79 y.o. PCP: [name], DO  Vitals:   04/19/20 1540  Weight: 212 lb (96.2 kg)    [facility] Weekly seesion - 04/19/20 1500      Weekly Session   Topic Discussed  Calorie breakdown   Clean 15, dirty dozen   Minutes exercised this week  180 minutes    Classes attended to date  74      Grateful for everything Nutrition celebration: cut down portions Barriers/struggles: snacks   Pam Tally Joe 04/19/2020, 3:41 PM

## 2020-04-20 ENCOUNTER — Other Ambulatory Visit: Payer: Self-pay | Admitting: Family Medicine

## 2020-04-25 ENCOUNTER — Ambulatory Visit: Payer: Medicare Other

## 2020-04-25 VITALS — BP 122/52 | Ht 63.0 in | Wt 215.6 lb

## 2020-04-25 DIAGNOSIS — Z794 Long term (current) use of insulin: Secondary | ICD-10-CM

## 2020-04-25 DIAGNOSIS — E1151 Type 2 diabetes mellitus with diabetic peripheral angiopathy without gangrene: Secondary | ICD-10-CM

## 2020-04-25 DIAGNOSIS — I10 Essential (primary) hypertension: Secondary | ICD-10-CM

## 2020-04-25 NOTE — Patient Instructions (Addendum)
Visit Information  Goals Addressed            This Visit's Progress   . Chronic Care Management       CARE PLAN ENTRY  Current Barriers:  . Chronic Disease Management support, education, and care coordination needs related to hypertension, chronic heart failure, COPD, type 2 diabetes, hyperlipidemia, GERD, osteoarthritis, osteoporosis, gout  Clinical Goal(s): Over the next 90 days, patient will:  . Work with the care management team to address educational, disease management, and care coordination needs  . Begin or continue self health monitoring activities as directed today  . Call provider office for new or worsened signs and symptoms  . Call care management team with questions or concerns . Maintain A1c less than 7%  . Maintain blood pressure less than 140/90 . Achieve LDL (Bad cholesterol) less than 70   Interventions:  . Evaluation of current treatment plans and patient's adherence to plan as established by provider . Assessed patient understanding of disease states . Assessed patient's education and care coordination needs . Provided disease specific education to patient  . Continue staying active at the [facility] ([place] for at least 150 minutes of exercise weekly) . Continue incorporating healthy food choices to improve cholesterol.    Patient Self Care Activities:  . Patient agrees to continue checking blood sugar three times daily  . Patient agrees to continue checking blood pressure daily  . Patient agrees to continue weighing himself daily.   Face to Face appointment with pharmacist scheduled for: 07/04/20 at 2:00 PM       The patient verbalized understanding of instructions provided today and agreed to receive a mailed copy of patient instruction and/or educational materials.  Doristine Section Clinical Pharmacist  Claudie Fisherman  [phone]

## 2020-04-25 NOTE — Chronic Care Management (AMB) (Signed)
Chronic Care Management Pharmacy  Name: [name].  MRN: [id number] DOB: 06/06/1941  Chief Complaint/ HPI  [name] Alcon.,  79 y.o. , male presents for their Initial CCM visit with the clinical pharmacist In office.  PCP : [name], DO  Their chronic conditions include: hypertension, chronic heart failure, COPD, type 2 diabetes, hyperlipidemia, GERD, osteoarthritis, osteoporosis, gout  Office Visits: 03/16/20: [place] with [name], RN for AWV. No medication changes noted at that time.  02/19/20: Patient presented for ear pain/parotitis. Patient started on Augmentin for 10 days.  Consult Visit: 03/02/20: Patient presented to Dr. [name] (endocrinology) for DM follow-up. A1c improved from 6.8% to 6.4%. eGFR stable at 47 mL/min. BG range  66-200. Novolog decreased to 9u/9u/10u/5u. Follow-up in 3 months.  02/10/20: Patient presented to Dr. [name] (pulmonology) for pulmonary emphysema. Breathing stable, only reports using his albuterol inhaler a few times weekly with exertion on long walks or when carrying items. No medication changes made.  12/30/19: Patient presented to Dr. [name] (endocrinology) for DM follow-up. A1c improved from 7.1% to 6.8%. BG range 41-302. Patient started on Dapagliflozin 2.5 mg daily, furosemide decreased to 120 mg daily, and Novolog decreased to 9u/9u/14u/5u. 11/02/19: Patient presented to Dr. [name] (Cardiology) for CAD/HF follow-up.  patient denies any chest pain, dyspnea, palpitations, lower extremity edema, orthopnea, PND, dizziness, near syncope or syncope, no medication changes made. Follow-up in one year.  Medications: Outpatient Encounter Medications as of 04/25/2020  Medication Sig  . dapagliflozin propanediol (FARXIGA) 5 MG TABS tablet Take 2.5 mg by mouth daily.  Marland Kitchen albuterol (VENTOLIN HFA) 108 (90 Base) MCG/ACT inhaler Inhale 2 puffs into the lungs every 6 (six) hours as needed for wheezing or shortness of  breath.  . allopurinol (ZYLOPRIM) 300 MG tablet TAKE 1 TABLET(300 MG) BY MOUTH DAILY  . AMBULATORY NON FORMULARY MEDICATION O2 @ 2LMP @ night  . aspirin 81 MG tablet Take 81 mg by mouth daily.    Marland Kitchen azelastine (ASTELIN) 0.1 % nasal spray Place 2 sprays into both nostrils 2 (two) times daily as needed for rhinitis. Use in each nostril as directed  . B Complex Vitamins (VITAMIN B COMPLEX PO) Take 1 tablet by mouth daily.  . budesonide-formoterol (SYMBICORT) 160-4.5 MCG/ACT inhaler Inhale 2 puffs into the lungs 2 (two) times daily.  . calcitRIOL (ROCALTROL) 0.25 MCG capsule Take 1 capsule (0.25 mcg total) by mouth daily.  . carvedilol (COREG) 25 MG tablet Take 1 tablet (25 mg total) by mouth 2 (two) times daily with a meal.  . cetirizine (ZYRTEC) 10 MG tablet Take 10 mg by mouth as needed for allergies.   . cholecalciferol (VITAMIN D3) 25 MCG (1000 UNIT) tablet Take 1,000 Units by mouth daily.  . citalopram (CELEXA) 10 MG tablet TAKE 1 TABLET(10 MG) BY MOUTH DAILY  . clopidogrel (PLAVIX) 75 MG tablet Take 1 tablet (75 mg total) by mouth daily with breakfast.  . diphenhydrAMINE (BENADRYL) 25 MG tablet Take 25 mg by mouth every 6 (six) hours as needed.  . empagliflozin (JARDIANCE) 10 MG TABS tablet Take 5 mg by mouth daily before breakfast. (Patient not taking: Reported on 04/25/2020)  . finasteride (PROSCAR) 5 MG tablet Take 1 tablet (5 mg total) by mouth daily.  . furosemide (LASIX) 40 MG tablet Take 3 tablets by mouth every morning and 2 tablets every afternoon. (Patient taking differently: Take 3 tablets by mouth every morning)  . GuaiFENesin (MUCINEX MAXIMUM STRENGTH PO) Take 1 Dose by  mouth as needed (drainage).  . insulin aspart (NOVOLOG) 100 UNIT/ML injection 4 times a day (just before each meal) 09-01-09-5 (if HS snack) units.  . insulin NPH Human (HUMULIN N) 100 UNIT/ML injection Inject 0.03 mLs (3 Units total) into the skin at bedtime. And syringes 1/day  . isosorbide mononitrate (IMDUR) 60 MG  24 hr tablet Take 1 tablet (60 mg total) by mouth daily.  Marland Kitchen ketoconazole (NIZORAL) 2 % cream Apply 1 application topically daily as needed for irritation.   . Multiple Vitamins-Minerals (MULTIVITAMIN PO) Take 1 tablet by mouth daily.    . nitroGLYCERIN (NITROLINGUAL) 0.4 MG/SPRAY spray Place 1 spray under the tongue as directed. (Patient not taking: Reported on 03/30/2020)  . pantoprazole (PROTONIX) 40 MG tablet Take 1 tablet (40 mg total) by mouth daily.  . polyethylene glycol powder (MIRALAX) powder Take 17 g by mouth daily as needed (constipation).   . potassium chloride (K-DUR) 10 MEQ tablet Take 1 tablet (10 mEq total) by mouth daily.  . Protein (UNJURY UNFLAVORED PO) Take 8-16 [name] by mouth daily.   . rosuvastatin (CRESTOR) 10 MG tablet Take 1 tablet (10 mg total) by mouth daily.  . Tiotropium Bromide Monohydrate (SPIRIVA RESPIMAT) 2.5 MCG/ACT AERS Inhale 2 puffs into the lungs daily.  . traZODone (DESYREL) 100 MG tablet TAKE 1 TABLET(100 MG) BY MOUTH AT BEDTIME  . triamcinolone (KENALOG) 0.025 % cream Apply 1 application topically 3 (three) times daily as needed (skin).   No facility-administered encounter medications on file as of 04/25/2020.     Current Diagnosis/Assessment:  Goals Addressed            This Visit's Progress   . Chronic Care Management       CARE PLAN ENTRY  Current Barriers:  . Chronic Disease Management support, education, and care coordination needs related to hypertension, chronic heart failure, COPD, type 2 diabetes, hyperlipidemia, GERD, osteoarthritis, osteoporosis, gout  Clinical Goal(s): Over the next 90 days, patient will:  . Work with the care management team to address educational, disease management, and care coordination needs  . Begin or continue self health monitoring activities as directed today  . Call provider office for new or worsened signs and symptoms  . Call care management team with questions or concerns . Maintain A1c less than 7%   . Maintain blood pressure less than 140/90 . Achieve LDL (Bad cholesterol) less than 70   Interventions:  . Evaluation of current treatment plans and patient's adherence to plan as established by provider . Assessed patient understanding of disease states . Assessed patient's education and care coordination needs . Provided disease specific education to patient  . Continue staying active at the [facility] ([place] for at least 150 minutes of exercise weekly) . Continue incorporating healthy food choices to improve cholesterol.    Patient Self Care Activities:  . Patient agrees to continue checking blood sugar three times daily  . Patient agrees to continue checking blood pressure daily  . Patient agrees to continue weighing himself daily.   Face to Face appointment with pharmacist scheduled for: 07/04/20 at 2:00 PM       COPD w/ Emphysema   Last spirometry score: n/a  Gold Grade: Gold 3 (FEV1 30-49%) Current COPD Classification:  B (high sx, <2 exacerbations/yr)   History of recurrent right lower lobe pneumonias   Eosinophil count:   Lab Results  Component Value Date/Time   EOSPCT 3.3 08/29/2018 02:34 PM  %  Eos (Absolute):  Lab Results  Component Value Date/Time   EOSABS 0.3 08/29/2018 02:34 PM    Tobacco Status:  Social History   Tobacco Use  Smoking Status Former Smoker  . Packs/day: 2.00  . Years: 41.00  . Pack years: 82.00  . Types: Cigarettes  . Quit date: 12/24/1997  . Years since quitting: 22.3  Smokeless Tobacco Never Used    Patient has failed these meds in past: n/a Patient is currently controlled on the following medications:   Ventolin HFA 108 mcg 2 puff q6hr PRN   Symbicort 160-4.5 mcg/act 2 puff BID (sometimes only takes daily)   Spiriva 2.5 mcg 2 puff daily   O2 2L at night   Using maintenance inhaler regularly? Yes Frequency of rescue inhaler use:  infrequently once in past 4 months. Patien  We discussed:  proper  inhaler technique. Patient sometimes forgets to take his second dose of Symbicort. Otherwise doing fine other than some shortness of breath after exerting himself. He gets his Symbicort and Spiriva through the [place] (3 month supply costs $11)  Plan  Continue current medications  ,  Diabetes   Managed by Dr. [name] Regularly followed in PREP classes at the [facility]  Recent Relevant Labs: Lab Results  Component Value Date/Time   HGBA1C 6.4 (A) 03/02/2020 04:10 PM   HGBA1C 6.8 (A) 12/30/2019 01:33 PM   HGBA1C 7.1 12/29/2019 12:00 AM   HGBA1C 6.9 (H) 05/11/2015 01:56 PM   HGBA1C 7.0 (H) 02/10/2015 09:33 AM   MICROALBUR 38.0 (H) 02/10/2015 09:33 AM   MICROALBUR 29.8 (H) 02/10/2014 03:00 PM     Checking BG: 3x per Day  Recent FBG Readings: n/a Recent pre-meal BG readings: n/a Recent 2hr PP BG readings: n/a Recent HS BG readings: n/a  Patient has failed these meds in past: acarbose, nateglinide, Fargixa (cost) Patient is currently controlled on the following medications:   Farxiga 2.5 mg daily  Novolog Injection 9 units/ 9 units/ 10 units/ 5 units (HS snack)  NPH injection 3 units at bedtime   Last diabetic Foot exam:  Lab Results  Component Value Date/Time   HMDIABEYEEXA No Retinopathy 08/07/2019 12:00 AM    Last diabetic Eye exam: No results found for: HMDIABFOOTEX   We discussed: diet and exercise extensively. Denies hypoglycemia in past month. Patient unable to get Jardiance through [place] as requires a prior authorization. Patient resumed taking [place].   Current Exercise Habits: Structured exercise class, Type of exercise: stretching;walking;strength training/weights, Time (Minutes): 60, Frequency (Times/Week): 2, Weekly Exercise (Minutes/Week): 120   Diet (meal preparation, eat out, water intake, caffeinated beverages, dairy products, fruits and vegetables): Breakfast: protein drink Lunch: pb toast Dinner: Steak, spinach, potatoes  Plan Continue current  medications Heart Failure   Managed by Dr. Lauree Chandler   Type: Diastolic  Last ejection fraction: 55-60%  NYHA Class: II (slight limitation of activity) AHA HF Stage: C (Heart disease and symptoms present)  Patient has failed these meds in past: bisoprolol, diltiazem, metolzaone, midodrine, spironolactone, enalapril/lisinopril (cough)  Patient is currently controlled on the following medications:   Carvedilol 25 mg BID   Furosemide 120 mg AM   Imdur 60 mg daily (AM)   We discussed: patient weighing himself regularly. Since starting Fargixa and decreasing furosemide, he reports he has actually lost a few pounds.  Plan  Continue current medications Hypertension   Office blood pressures are  BP Readings from Last 3 Encounters:  04/25/20 (!) 122/52  03/16/20 126/67  03/02/20 122/70  CMP Latest Ref Rng & Units 03/02/2020 12/29/2019 08/29/2018  Glucose 70 - 99 mg/dL 207(H) - -  BUN 6 - 23 mg/dL 34(H) - -  Creatinine 0.40 - 1.50 mg/dL 1.44 1.6(A) -  Sodium 135 - 145 mEq/L 135 137 -  Potassium 3.5 - 5.1 mEq/L 4.2 4.0 -  Chloride 96 - 112 mEq/L 96 97(A) -  CO2 19 - 32 mEq/L 33(H) 36(A) -  Calcium 8.4 - 10.5 mg/dL 9.3 9.3 9.4  Total Protein 6.0 - 8.3 g/dL - - -  Total Bilirubin 0.2 - 1.2 mg/dL - - -  Alkaline Phos 39 - 117 U/L - - -  AST 14 - 40 - 8(A) -  ALT 10 - 40 - 21 -   Patient has failed these meds in the past: n/a Patient is currently controlled on the following medications:   Carvedilol 25 mg BID   Furosemide 120 mg AM  Imdur 60 mg daily   Patient checks BP at home daily  Patient home BP readings are ranging: 580-998P systolics  We discussed diet and exercise extensively  Plan  Continue current medications   Hyperlipidemia   History of CAD requiring multiple PCI and history of PAD.   Lipid Panel     Component Value Date/Time   CHOL 137 12/29/2019 0000   TRIG 71 12/29/2019 0000   HDL 54 12/29/2019 0000   CHOLHDL 3 08/29/2018 1434   VLDL 19.8  08/29/2018 1434   LDLCALC 84 12/29/2019 0000     The ASCVD Risk score Mikey Bussing DC Jr., et al., 2013) failed to calculate for the following reasons:   The patient has a prior MI or stroke diagnosis   Patient has failed these meds in past: n/a Patient is currently uncontrolled on the following medications:   Aspirin 81 mg daily (supper)   Clopidogrel 75 mg daily  Nitroglycerin PRN  Rosuvastatin 10 mg daily (evening meal) LDL Goal <70  We discussed:  diet and exercise extensively. Denies unusual bruising/bleeding.  Plan Patient amenable to reaching LDL goal with focus on lifestyle changes. Continue current medications  Recommend follow-up lipid panel ~06/27/20 to assess changes.   Gout   Uric Acid, Serum  Date Value Ref Range Status  08/29/2018 4.4 4.0 - 7.8 mg/dL Final    Patient has failed these meds in past: n/a Patient is currently controlled on the following medications:   Allopurinol 300 mg daily (AM)  We discussed:  Last flare was a couple of years ago  Plan  Continue current medications  Mood   Patient has failed these meds in past: n/a Patient is currently controlled on the following medications:  Citalopram 10 mg daily (AM)  We discussed: Patient reports mood steady, tolerating citalopram well.  Plan  Continue current medications  GERD   Patient has failed these meds in past: n/a Patient is currently controlled on the following medications:   Pantoprazole 40 mg daily   We discussed: After attempting to reduce pantoprazole to q48hr dosing, patient had relapse of severe symptoms, so resumed taking pantoprazole daily. Symptoms improved now. Patient has 4 days of pantoprazole remaining.   Plan Continue current medications   Misc/OTC    Azelastin 0.1% nasal spray  Calcitriol 0.25 mcg daily (AM) Cetirizine 10 mg daily PRN  Diphenhydramine 25 mg q6hr PRN (Takes at night)  Finasteride 5 mg daily Guaifenesin daily PRN  Ketoconazole 2% cream Multivitamin  daily  Miralax 17 g daily as needed  Potassium chloride 10 mEq daily (AM)  Trazodone 100 mg QHS Triamcinolone 0.025% cream   Plan  Continue current medications  Medication Management   Pt uses [place] for most of his medications, although he gets a few at the [place] to save on costs. Does not have a pillbox, keeps his medicines in a "golf shoebag" and takes them out when he takes them. States he is very regimented about his medications and does not recall missing any doses recently.   Follow up: 2 months   Doristine Section [phone]

## 2020-04-27 ENCOUNTER — Telehealth: Payer: Self-pay

## 2020-04-27 ENCOUNTER — Other Ambulatory Visit: Payer: Self-pay

## 2020-04-27 MED ORDER — PANTOPRAZOLE SODIUM 40 MG PO TBEC: 40.0000 mg | DELAYED_RELEASE_TABLET | Freq: Every day | ORAL | 3 refills | Status: DC

## 2020-04-27 NOTE — Telephone Encounter (Signed)
Last OV 02/19/20 Last fill 04/15/19  #90/3

## 2020-04-27 NOTE — Telephone Encounter (Signed)
Patient called, requesting refill of pantoprazole 40 mg. He only has a few days remaining on the medication. [place], would you be able to send in a refill of this medication for him?   Thanks,  Doristine Section Clinical Pharmacist Velora Heckler at [facility]  [phone]

## 2020-04-27 NOTE — Telephone Encounter (Signed)
Rx refill sent and pt aware.

## 2020-05-03 NOTE — Progress Notes (Signed)
[facility] Weekly Session   Patient Details  Name: [name]. MRN: [id number] Date of Birth: 06-25-1941 Age: 79 y.o. PCP: [name], DO  Vitals:   05/03/20 1615  Weight: 215 lb (97.5 kg)    [employer] Weekly seesion - 05/03/20 1600      Weekly Session   Topic Discussed  --   goal setting    Minutes exercised this week  80 minutes    Classes attended to date  22     Fun things since last meeting: visited my daughter Grateful for: everything Barriers/ struggles: snacks    Barnett Hatter 05/03/2020, 4:16 PM

## 2020-05-08 ENCOUNTER — Other Ambulatory Visit: Payer: Self-pay | Admitting: Family Medicine

## 2020-05-08 DIAGNOSIS — K219 Gastro-esophageal reflux disease without esophagitis: Secondary | ICD-10-CM

## 2020-05-08 DIAGNOSIS — E785 Hyperlipidemia, unspecified: Secondary | ICD-10-CM

## 2020-05-08 DIAGNOSIS — E1169 Type 2 diabetes mellitus with other specified complication: Secondary | ICD-10-CM

## 2020-05-08 MED ORDER — PANTOPRAZOLE SODIUM 40 MG PO TBEC: 40.0000 mg | DELAYED_RELEASE_TABLET | Freq: Every day | ORAL | 3 refills | Status: DC

## 2020-05-25 DIAGNOSIS — L578 Other skin changes due to chronic exposure to nonionizing radiation: Secondary | ICD-10-CM | POA: Diagnosis not present

## 2020-05-25 DIAGNOSIS — L570 Actinic keratosis: Secondary | ICD-10-CM | POA: Diagnosis not present

## 2020-05-25 DIAGNOSIS — Z85828 Personal history of other malignant neoplasm of skin: Secondary | ICD-10-CM | POA: Diagnosis not present

## 2020-05-25 DIAGNOSIS — D235 Other benign neoplasm of skin of trunk: Secondary | ICD-10-CM | POA: Diagnosis not present

## 2020-05-25 DIAGNOSIS — D225 Melanocytic nevi of trunk: Secondary | ICD-10-CM | POA: Diagnosis not present

## 2020-05-25 DIAGNOSIS — Z86018 Personal history of other benign neoplasm: Secondary | ICD-10-CM | POA: Diagnosis not present

## 2020-05-25 DIAGNOSIS — D2230 Melanocytic nevi of unspecified part of face: Secondary | ICD-10-CM | POA: Diagnosis not present

## 2020-05-25 DIAGNOSIS — L821 Other seborrheic keratosis: Secondary | ICD-10-CM | POA: Diagnosis not present
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
## 2020-06-06 ENCOUNTER — Other Ambulatory Visit: Payer: Self-pay

## 2020-06-07 ENCOUNTER — Encounter: Payer: Self-pay | Admitting: Nurse Practitioner

## 2020-06-07 ENCOUNTER — Ambulatory Visit (INDEPENDENT_AMBULATORY_CARE_PROVIDER_SITE_OTHER): Payer: Medicare Other | Admitting: Nurse Practitioner

## 2020-06-07 VITALS — BP 122/62 | HR 68 | Temp 96.8°F | Ht 63.0 in | Wt 218.2 lb

## 2020-06-07 DIAGNOSIS — K1120 Sialoadenitis, unspecified: Secondary | ICD-10-CM

## 2020-06-07 MED ORDER — AMOXICILLIN-POT CLAVULANATE 875-125 MG PO TABS: 1.0000 | ORAL_TABLET | Freq: Two times a day (BID) | ORAL | 0 refills | Status: DC

## 2020-06-07 NOTE — Progress Notes (Signed)
Subjective:  Patient ID: [name]., male    DOB: May 09, 1941  Age: 79 y.o. MRN: [id number]  CC: Pain (right side jawline-feels like a bad toothache-said Dr C treated before with antibiotics-pt said not in his ear but jawline)  Otalgia  There is pain in the right ear. This is a recurrent problem. The current episode started more than 1 year ago. The problem has been waxing and waning. There has been no fever. The pain is severe. Pertinent negatives include no coughing, ear discharge, headaches, hearing loss, neck pain, rash, rhinorrhea or sore throat. He has tried nothing for the symptoms.  had similar symptoms 01/2020, treated for parotitis with augmentin x 10days, reports his symptoms resolved. Reports dental cleaning every 52months, last completed 22months ago. Controlled Dm with HgbA1c at 6.4  Reviewed past Medical, Social and Family history today.  Outpatient Medications Prior to Visit  Medication Sig Dispense Refill  . albuterol (VENTOLIN HFA) 108 (90 Base) MCG/ACT inhaler Inhale 2 puffs into the lungs every 6 (six) hours as needed for wheezing or shortness of breath. 18 g 3  . allopurinol (ZYLOPRIM) 300 MG tablet TAKE 1 TABLET(300 MG) BY MOUTH DAILY 90 tablet 3  . AMBULATORY NON FORMULARY MEDICATION O2 @ 2LMP @ night    . aspirin 81 MG tablet Take 81 mg by mouth daily.      Marland Kitchen azelastine (ASTELIN) 0.1 % nasal spray Place 2 sprays into both nostrils 2 (two) times daily as needed for rhinitis. Use in each nostril as directed 30 mL 5  . B Complex Vitamins (VITAMIN B COMPLEX PO) Take 1 tablet by mouth daily.    . budesonide-formoterol (SYMBICORT) 160-4.5 MCG/ACT inhaler Inhale 2 puffs into the lungs 2 (two) times daily. 1 Inhaler 0  . calcitRIOL (ROCALTROL) 0.25 MCG capsule Take 1 capsule (0.25 mcg total) by mouth daily. 90 capsule 3  . carvedilol (COREG) 25 MG tablet Take 1 tablet (25 mg total) by mouth 2 (two) times daily with a meal. 180 tablet 3  . cetirizine (ZYRTEC) 10 MG tablet  Take 10 mg by mouth as needed for allergies.     . cholecalciferol (VITAMIN D3) 25 MCG (1000 UNIT) tablet Take 1,000 Units by mouth daily.    . citalopram (CELEXA) 10 MG tablet TAKE 1 TABLET(10 MG) BY MOUTH DAILY 90 tablet 3  . clopidogrel (PLAVIX) 75 MG tablet Take 1 tablet (75 mg total) by mouth daily with breakfast. 90 tablet 3  . dapagliflozin propanediol (FARXIGA) 5 MG TABS tablet Take 2.5 mg by mouth daily.    . diphenhydrAMINE (BENADRYL) 25 MG tablet Take 25 mg by mouth every 6 (six) hours as needed.    . empagliflozin (JARDIANCE) 10 MG TABS tablet Take 5 mg by mouth daily before breakfast. 45 tablet 3  . finasteride (PROSCAR) 5 MG tablet Take 1 tablet (5 mg total) by mouth daily. 90 tablet 3  . furosemide (LASIX) 40 MG tablet Take 3 tablets by mouth every morning and 2 tablets every afternoon. (Patient taking differently: Take 3 tablets by mouth every morning) 450 tablet 2  . GuaiFENesin (MUCINEX MAXIMUM STRENGTH PO) Take 1 Dose by mouth as needed (drainage).    . insulin aspart (NOVOLOG) 100 UNIT/ML injection 4 times a day (just before each meal) 09-01-09-5 (if HS snack) units. 20 mL 11  . insulin NPH Human (HUMULIN N) 100 UNIT/ML injection Inject 0.03 mLs (3 Units total) into the skin at bedtime. And syringes 1/day 10 mL 11  .  isosorbide mononitrate (IMDUR) 60 MG 24 hr tablet Take 1 tablet (60 mg total) by mouth daily. 90 tablet 3  . ketoconazole (NIZORAL) 2 % cream Apply 1 application topically daily as needed for irritation.     . Multiple Vitamins-Minerals (MULTIVITAMIN PO) Take 1 tablet by mouth daily.      . nitroGLYCERIN (NITROLINGUAL) 0.4 MG/SPRAY spray Place 1 spray under the tongue as directed. 12 g 1  . pantoprazole (PROTONIX) 40 MG tablet Take 1 tablet (40 mg total) by mouth daily. 90 tablet 3  . polyethylene glycol powder (MIRALAX) powder Take 17 g by mouth daily as needed (constipation).     . potassium chloride (K-DUR) 10 MEQ tablet Take 1 tablet (10 mEq total) by mouth daily.  90 tablet 3  . Protein (UNJURY UNFLAVORED PO) Take 8-16 oz by mouth daily.     . rosuvastatin (CRESTOR) 10 MG tablet Take 1 tablet (10 mg total) by mouth daily. 90 tablet 3  . Tiotropium Bromide Monohydrate (SPIRIVA RESPIMAT) 2.5 MCG/ACT AERS Inhale 2 puffs into the lungs daily. 1 Inhaler 11  . traZODone (DESYREL) 100 MG tablet TAKE 1 TABLET(100 MG) BY MOUTH AT BEDTIME 90 tablet 3  . triamcinolone (KENALOG) 0.025 % cream Apply 1 application topically 3 (three) times daily as needed (skin).     No facility-administered medications prior to visit.    ROS See HPI  Objective:  BP 122/62   Pulse 68   Temp (!) 96.8 F (36 C) (Tympanic)   Ht 5\' 3"  (1.6 m)   Wt 218 lb 3.2 oz (99 kg)   SpO2 97%   BMI 38.65 kg/m   BP Readings from Last 3 Encounters:  06/07/20 122/62  05/05/20 140/78  04/25/20 (!) 122/52    Wt Readings from Last 3 Encounters:  06/07/20 218 lb 3.2 oz (99 kg)  05/05/20 216 lb 3.2 oz (98.1 kg)  05/03/20 215 lb (97.5 kg)    Physical Exam Vitals reviewed.  Constitutional:      Appearance: He is obese.  HENT:     Head:     Jaw: Trismus, tenderness, swelling and pain on movement present. No malocclusion.     Salivary Glands: Right salivary gland is diffusely enlarged and tender. Left salivary gland is not diffusely enlarged or tender.      Right Ear: External ear normal. There is impacted cerumen. No mastoid tenderness.     Left Ear: Tympanic membrane, ear canal and external ear normal. No mastoid tenderness.     Mouth/Throat:     Mouth: No oral lesions.     Dentition: Abnormal dentition. Does not have dentures. No dental tenderness, gingival swelling or gum lesions.     Pharynx: No pharyngeal swelling, oropharyngeal exudate or posterior oropharyngeal erythema.     Tonsils: No tonsillar exudate.  Cardiovascular:     Rate and Rhythm: Normal rate.     Pulses: Normal pulses.  Musculoskeletal:     Cervical back: Normal range of motion and neck supple.    Lymphadenopathy:     Cervical: No cervical adenopathy.  Skin:    Findings: No erythema or rash.  Neurological:     Mental Status: He is alert.     Lab Results  Component Value Date   WBC 8.3 12/29/2019   HGB 12.6 (A) 12/29/2019   HCT 40 (A) 12/29/2019   PLT 203 12/29/2019   GLUCOSE 207 (H) 03/02/2020   CHOL 137 12/29/2019   TRIG 71 12/29/2019   HDL 54 12/29/2019  LDLCALC 84 12/29/2019   ALT 21 12/29/2019   AST 8 (A) 12/29/2019   NA 135 03/02/2020   K 4.2 03/02/2020   CL 96 03/02/2020   CREATININE 1.44 03/02/2020   BUN 34 (H) 03/02/2020   CO2 33 (H) 03/02/2020   TSH 2.38 12/29/2019   PSA 0.75 08/29/2018   INR 1.08 07/19/2013   HGBA1C 6.4 (A) 03/02/2020   MICROALBUR 38.0 (H) 02/10/2015    Assessment & Plan:  This visit occurred during the SARS-CoV-2 public health emergency.  Safety protocols were in place, including screening questions prior to the visit, additional usage of staff PPE, and extensive cleaning of exam room while observing appropriate contact time as indicated for disinfecting solutions.   [place]ord was seen today for pain.  Diagnoses and all orders for this visit:  Parotitis -     Ambulatory referral to ENT -     amoxicillin-clavulanate (AUGMENTIN) 875-125 MG tablet; Take 1 tablet by mouth 2 (two) times daily.   I am having [name]. start on amoxicillin-clavulanate. I am also having him maintain his aspirin, polyethylene glycol powder, Multiple Vitamins-Minerals (MULTIVITAMIN PO), Protein (UNJURY UNFLAVORED PO), nitroGLYCERIN, ketoconazole, AMBULATORY NON FORMULARY MEDICATION, cetirizine, Tiotropium Bromide Monohydrate, triamcinolone, GuaiFENesin (MUCINEX MAXIMUM STRENGTH PO), diphenhydrAMINE, budesonide-formoterol, insulin NPH Human, azelastine, potassium chloride, citalopram, rosuvastatin, carvedilol, traZODone, finasteride, calcitRIOL, isosorbide mononitrate, albuterol, clopidogrel, furosemide, insulin aspart, allopurinol, cholecalciferol, B  Complex Vitamins (VITAMIN B COMPLEX PO), Jardiance, Farxiga, and pantoprazole.  Meds ordered this encounter  Medications  . amoxicillin-clavulanate (AUGMENTIN) 875-125 MG tablet    Sig: Take 1 tablet by mouth 2 (two) times daily.    Dispense:  14 tablet    Refill:  0    Order Specific Question:   Supervising Provider    Answer:   [name] [[id number]]    Problem List Items Addressed This Visit    None    Visit Diagnoses    Parotitis    -  Primary   Relevant Medications   amoxicillin-clavulanate (AUGMENTIN) 875-125 MG tablet   Other Relevant Orders   Ambulatory referral to ENT      Follow-up: No follow-ups on file.  Wilfred Lacy, NP

## 2020-06-07 NOTE — Patient Instructions (Signed)
You will be contacted to schedule appt with ENT   Parotitis  Parotitis means that you have irritation and swelling (inflammation) in one or both of your parotid glands. These glands make saliva. They are found on each side of your face, below and in front of your earlobes. You may or may not have pain with this condition. What are the causes? This condition may be caused by:  Infections from germs (bacteria or viruses).  Something blocking the flow of saliva through the parotid glands. This can be a stone, scar tissue, or a tumor.  Diseases that cause your body's defense system (immune system) to attack healthy cells in your salivary glands. These are called autoimmune diseases. What increases the risk? You are more likely to get this condition if:  You are 30 years old or older.  You do not drink enough fluids (are dehydrated).  You drink too much alcohol.  You have: ? A dry mouth. ? Diabetes. ? Gout. ? A long-term illness.  You do not take good care of your mouth and teeth (poor dental hygiene).  You have had radiation treatments to the head and neck.  You take certain medicines. What are the signs or symptoms? Symptoms of this condition depend on the cause. They may include:  Swelling under and in front of the ear. This may get worse after you eat.  Redness of the skin over the parotid gland.  Pain and tenderness over the parotid gland. This may get worse after you eat.  Fever or chills.  Pus coming from the ducts inside the mouth.  Dry mouth.  A bad taste in the mouth. How is this treated? Treatment for this condition depends on the cause. Treatment may include:  Antibiotic medicine for an infection from bacteria.  Drinking more fluids.  Removing a stone or obstruction.  Treating a disease that is causing parotitis.  Surgery to drain an infection, remove a growth, or remove the whole gland. Treatment may not be needed if the swelling goes away with  home care. Follow these instructions at home: Medicines   Take over-the-counter and prescription medicines only as told by your doctor.  If you were prescribed an antibiotic medicine, take it as told by your doctor. Do not stop taking the antibiotic even if you start to feel better. Managing pain and swelling  If told, put heat on the affected area. Do this as often as told by your doctor. Use the heat source that your doctor recommends, such as a moist heat pack or a heating pad. ? Place a towel between your skin and the heat source. ? Leave the heat on for 20-30 minutes. ? Remove the heat if your skin turns bright red. This is very important if you are unable to feel pain, heat, or cold. You may have a greater risk of getting burned.  Gargle with salt water 3-4 times a day or as needed. To make salt water, dissolve -1 tsp (3-6 g) of salt in 1 cup (237 mL) of warm water.  Gently rub your parotid glands as told by your doctor. General instructions   Drink enough fluid to keep your pee (urine) pale yellow.  Keep your mouth clean and moist.  Suck on sour candy. This may help to: ? Make your mouth less dry. ? Make more saliva.  Take good care of your mouth: ? Brush your teeth at least two times a day. ? Floss your teeth every day. ? See your dentist  regularly.  Do not use any products that contain nicotine or tobacco. These include cigarettes, e-cigarettes, and chewing tobacco. If you need help quitting, ask your doctor.  Do not drink alcohol.  Keep all follow-up visits as told by your doctor. This is important. Contact a doctor if:  You have a fever or chills.  You have new symptoms.  Your symptoms get worse.  Your symptoms do not get better with treatment. Get help right away if:  You have trouble breathing or swallowing. Summary  Parotitis means that you have irritation and swelling (inflammation) in one or both of your parotid glands.  Symptoms include pain  and swelling under and in front of the ear.  Treatment for parotitis depends on the cause. In some cases, the condition may go away on its own with home care.  You should drink plenty of fluids, take good care of your mouth, and avoid tobacco products. This information is not intended to replace advice given to you by your health care provider. Make sure you discuss any questions you have with your health care provider. Document Revised: 07/08/2018 Document Reviewed: 07/08/2018 Elsevier Patient Education  [place].

## 2020-06-13 ENCOUNTER — Other Ambulatory Visit: Payer: Self-pay

## 2020-06-13 ENCOUNTER — Encounter: Payer: Self-pay | Admitting: Endocrinology

## 2020-06-13 ENCOUNTER — Ambulatory Visit (INDEPENDENT_AMBULATORY_CARE_PROVIDER_SITE_OTHER): Payer: Medicare Other | Admitting: Endocrinology

## 2020-06-13 VITALS — BP 100/60 | HR 84 | Ht 63.0 in | Wt 218.2 lb

## 2020-06-13 DIAGNOSIS — Z794 Long term (current) use of insulin: Secondary | ICD-10-CM | POA: Diagnosis not present

## 2020-06-13 DIAGNOSIS — E1151 Type 2 diabetes mellitus with diabetic peripheral angiopathy without gangrene: Secondary | ICD-10-CM

## 2020-06-13 LAB — POCT GLYCOSYLATED HEMOGLOBIN (HGB A1C): Hemoglobin A1C: 6.8 % — AB (ref 4.0–5.6)

## 2020-06-13 MED ORDER — INSULIN ASPART 100 UNIT/ML ~~LOC~~ SOLN: 7.0000 [IU] | Freq: Three times a day (TID) | SUBCUTANEOUS | 11 refills | Status: DC

## 2020-06-13 MED ORDER — TRULICITY 0.75 MG/0.5ML ~~LOC~~ SOAJ: 0.7500 mg | SUBCUTANEOUS | 11 refills | Status: DC
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
## 2020-06-13 NOTE — Patient Instructions (Addendum)
I have sent a prescription to your pharmacy, to add "Trulicity," and: Please reduce the novolog to 7 uniuts 3 times a day (just before each meal), and: stop the NPH insulin. check your blood sugar twice a day.  vary the time of day when you check, between before the 3 meals, and at bedtime.  also check if you have symptoms of your blood sugar being too high or too low.  please keep a record of the readings and bring it to your next appointment here (or you can bring the meter itself).  You can write it on any piece of paper.  please call us sooner if your blood sugar goes below 70, or if you have a lot of readings over 200.  Please come back for a follow-up appointment in 2 months.

## 2020-06-29 ENCOUNTER — Other Ambulatory Visit: Payer: Self-pay

## 2020-06-29 MED ORDER — CLOPIDOGREL BISULFATE 75 MG PO TABS: 75.0000 mg | ORAL_TABLET | Freq: Every day | ORAL | 1 refills | Status: DC

## 2020-06-29 MED ORDER — FUROSEMIDE 40 MG PO TABS: ORAL_TABLET | ORAL | 1 refills | Status: DC

## 2020-06-29 MED ORDER — CARVEDILOL 25 MG PO TABS: 25.0000 mg | ORAL_TABLET | Freq: Two times a day (BID) | ORAL | 1 refills | Status: DC

## 2020-07-01 ENCOUNTER — Other Ambulatory Visit: Payer: Self-pay

## 2020-07-01 ENCOUNTER — Ambulatory Visit (INDEPENDENT_AMBULATORY_CARE_PROVIDER_SITE_OTHER): Payer: Medicare Other | Admitting: Otolaryngology

## 2020-07-01 DIAGNOSIS — Z8719 Personal history of other diseases of the digestive system: Secondary | ICD-10-CM
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
## 2020-07-04 ENCOUNTER — Ambulatory Visit: Payer: Medicare Other

## 2020-07-04 DIAGNOSIS — Z794 Long term (current) use of insulin: Secondary | ICD-10-CM

## 2020-07-04 DIAGNOSIS — E1169 Type 2 diabetes mellitus with other specified complication: Secondary | ICD-10-CM

## 2020-07-04 NOTE — Chronic Care Management (AMB) (Signed)
Chronic Care Management Pharmacy  Name: Charles Andringa.  MRN: [id number] DOB: January 11, 1941  Chief Complaint/ HPI  [name].,  79 y.o. , male presents for their Initial CCM visit with the clinical pharmacist In office.  PCP : [name], DO  Their chronic conditions include: hypertension, chronic heart failure, COPD, type 2 diabetes, hyperlipidemia, GERD, osteoarthritis, osteoporosis, gout  Office Visits: 06/07/20: Patient presented to Wilfred Lacy, NP for follow-up. Patient with pain in jaw line. Patient started on Augmentin x 7 days. Patient referred to ENT. 03/16/20: Televist with [name], RN for AWV. No medication changes noted at that time.  02/19/20: Patient presented to Dr. Bryan Lemma for ear pain/parotitis. Patient started on Augmentin for 10 days.  Consult Visit: 07/01/20: Patient referred to Dr. Lucia Gaskins (otolaryngology) for parotitis. No medication changes made.  06/13/20: Patient presented to Dr. Loanne Drilling for T2DM follow-up. A1c worsened slightly to 6.8%. Jardiance stopped, Insulin NPH stopped (pt not taking). Patient started on Trulicity 8.63 mg weekly, insulin aspart decreased to 7 units TID 03/02/20: Patient presented to Dr. [name] (endocrinology) for DM follow-up. A1c improved from 6.8% to 6.4%. eGFR stable at 47 mL/min. BG range  66-200. Novolog decreased to 9u/9u/10u/5u. Follow-up in 3 months.  02/10/20: Patient presented to Dr. [name] (pulmonology) for pulmonary emphysema. Breathing stable, only reports using his albuterol inhaler a few times weekly with exertion on long walks or when carrying items. No medication changes made.  12/30/19: Patient presented to Dr. [name] (endocrinology) for DM follow-up. A1c improved from 7.1% to 6.8%. BG range 41-302. Patient started on Dapagliflozin 2.5 mg daily, furosemide decreased to 120 mg daily, and Novolog decreased to 9u/9u/14u/5u. 11/02/19: Patient presented to Dr. [name] (Cardiology) for  CAD/HF follow-up.  patient denies any chest pain, dyspnea, palpitations, lower extremity edema, orthopnea, PND, dizziness, near syncope or syncope, no medication changes made. Follow-up in one year.  Medications: Outpatient Encounter Medications as of 07/04/2020  Medication Sig  . albuterol (VENTOLIN HFA) 108 (90 Base) MCG/ACT inhaler Inhale 2 puffs into the lungs every 6 (six) hours as needed for wheezing or shortness of breath.  . allopurinol (ZYLOPRIM) 300 MG tablet TAKE 1 TABLET(300 MG) BY MOUTH DAILY  . AMBULATORY NON FORMULARY MEDICATION O2 @ 2LMP @ night  . amoxicillin-clavulanate (AUGMENTIN) 875-125 MG tablet Take 1 tablet by mouth 2 (two) times daily.  Marland Kitchen aspirin 81 MG tablet Take 81 mg by mouth daily.    Marland Kitchen azelastine (ASTELIN) 0.1 % nasal spray Place 2 sprays into both nostrils 2 (two) times daily as needed for rhinitis. Use in each nostril as directed  . B Complex Vitamins (VITAMIN B COMPLEX PO) Take 1 tablet by mouth daily.  . budesonide-formoterol (SYMBICORT) 160-4.5 MCG/ACT inhaler Inhale 2 puffs into the lungs 2 (two) times daily.  . calcitRIOL (ROCALTROL) 0.25 MCG capsule Take 1 capsule (0.25 mcg total) by mouth daily.  . carvedilol (COREG) 25 MG tablet Take 1 tablet (25 mg total) by mouth 2 (two) times daily with a meal.  . cetirizine (ZYRTEC) 10 MG tablet Take 10 mg by mouth as needed for allergies.   . cholecalciferol (VITAMIN D3) 25 MCG (1000 UNIT) tablet Take 1,000 Units by mouth daily.  . citalopram (CELEXA) 10 MG tablet TAKE 1 TABLET(10 MG) BY MOUTH DAILY  . clopidogrel (PLAVIX) 75 MG tablet Take 1 tablet (75 mg total) by mouth daily with breakfast.  . dapagliflozin propanediol (FARXIGA) 5 MG TABS tablet Take 2.5 mg by mouth daily.  Marland Kitchen  diphenhydrAMINE (BENADRYL) 25 MG tablet Take 25 mg by mouth every 6 (six) hours as needed.  . Dulaglutide (TRULICITY) 6.28 BT/5.1VO SOPN Inject 0.5 mLs (0.75 mg total) into the skin once a week.  . finasteride (PROSCAR) 5 MG tablet Take 1  tablet (5 mg total) by mouth daily.  . furosemide (LASIX) 40 MG tablet Take 3 tablets by mouth every morning and 2 tablets every afternoon.  . GuaiFENesin (MUCINEX MAXIMUM STRENGTH PO) Take 1 Dose by mouth as needed (drainage).  . insulin aspart (NOVOLOG) 100 UNIT/ML injection Inject 7 Units into the skin 3 (three) times daily with meals. 4 times a day (just before each meal) 09-01-09-5 (if HS snack) units.  . isosorbide mononitrate (IMDUR) 60 MG 24 hr tablet Take 1 tablet (60 mg total) by mouth daily.  Marland Kitchen ketoconazole (NIZORAL) 2 % cream Apply 1 application topically daily as needed for irritation.   . Multiple Vitamins-Minerals (MULTIVITAMIN PO) Take 1 tablet by mouth daily.    . nitroGLYCERIN (NITROLINGUAL) 0.4 MG/SPRAY spray Place 1 spray under the tongue as directed.  . pantoprazole (PROTONIX) 40 MG tablet Take 1 tablet (40 mg total) by mouth daily.  . polyethylene glycol powder (MIRALAX) powder Take 17 g by mouth daily as needed (constipation).   . potassium chloride (K-DUR) 10 MEQ tablet Take 1 tablet (10 mEq total) by mouth daily.  . Protein (UNJURY UNFLAVORED PO) Take 8-16 oz by mouth daily.   . rosuvastatin (CRESTOR) 10 MG tablet Take 1 tablet (10 mg total) by mouth daily.  . Tiotropium Bromide Monohydrate (SPIRIVA RESPIMAT) 2.5 MCG/ACT AERS Inhale 2 puffs into the lungs daily.  . traZODone (DESYREL) 100 MG tablet TAKE 1 TABLET(100 MG) BY MOUTH AT BEDTIME  . triamcinolone (KENALOG) 0.025 % cream Apply 1 application topically 3 (three) times daily as needed (skin).   No facility-administered encounter medications on file as of 07/04/2020.     Current Diagnosis/Assessment:  Goals Addressed            This Visit's Progress   . Chronic Care Management       CARE PLAN ENTRY (see longitudinal plan of care for additional care plan information)  Current Barriers:  . Chronic Disease Management support, education, and care coordination needs related to hypertension, chronic heart  failure, COPD, type 2 diabetes, hyperlipidemia, GERD, osteoarthritis, osteoporosis, gout   Hypertension BP Readings from Last 3 Encounters:  06/13/20 100/60  06/07/20 122/62  05/05/20 140/78   . Pharmacist Clinical Goal(s): o Over the next 90 days, patient will work with PharmD and providers to maintain BP goal <130/80 . Current regimen:  o Carvedilol 25 mg twice daily  o Furosemide 120 mg daily o Imdur 60 mg daily  . Patient self care activities - Over the next 90 days, patient will: o Check blood pressure daily, document, and provide at future appointments o Ensure daily salt intake < 2300 mg/day  Hyperlipidemia Lab Results  Component Value Date/Time   LDLCALC 84 12/29/2019 12:00 AM   . Pharmacist Clinical Goal(s): o Over the next 90 days, patient will work with PharmD and providers to achieve LDL goal < 70 . Current regimen:  o Rosuvastatin 10 mg daily  . Interventions: o Fasting Lipid Panel Ordered . Patient self care activities - Over the next 7 days, patient will: o Schedule appointment with lab to check fasting lipid panel  Diabetes Lab Results  Component Value Date/Time   HGBA1C 6.8 (A) 06/13/2020 04:17 PM   HGBA1C 6.4 (A) 03/02/2020  04:10 PM   HGBA1C 7.1 12/29/2019 12:00 AM   HGBA1C 6.9 (H) 05/11/2015 01:56 PM   HGBA1C 7.0 (H) 02/10/2015 09:33 AM   . Pharmacist Clinical Goal(s): o Over the next 90 days, patient will work with PharmD and providers to maintain A1c goal <7% . Current regimen:  o Farxiga 2.5 mg daily o Novolog Injection 7 units/ 7 units/ 7 units/ 5 units (HS snack) o Trulicity 2.68 mg weekly  . Interventions: o Recommend drinking 64 oz of water daily in addition to consistent fiber intake to help with constipation. o Counseled on the importance of smaller meals while on Trulicity to minimize chance of stomach upset. . Patient self care activities - Over the next 90 days, patient will: o Check blood sugar 3-4 times daily, document, and provide  at future appointments o Contact provider with any episodes of hypoglycemia  Medication management . Pharmacist Clinical Goal(s): o Over the next 90 days, patient will work with PharmD and providers to maintain optimal medication adherence . Current pharmacy: [facility] . Interventions o Comprehensive medication review performed. o Continue current medication management strategy . Patient self care activities - Over the next 90 days, patient will: o Take medications as prescribed o Report any questions or concerns to PharmD and/or provider(s)       COPD w/ Emphysema   Last spirometry score: n/a  Gold Grade: Gold 3 (FEV1 30-49%) Current COPD Classification:  B (high sx, <2 exacerbations/yr)   History of recurrent right lower lobe pneumonias   Eosinophil count:   Lab Results  Component Value Date/Time   EOSPCT 3.3 08/29/2018 02:34 PM  %                               Eos (Absolute):  Lab Results  Component Value Date/Time   EOSABS 0.3 08/29/2018 02:34 PM    Tobacco Status:  Social History   Tobacco Use  Smoking Status Former Smoker  . Packs/day: 2.00  . Years: 41.00  . Pack years: 82.00  . Types: Cigarettes  . Quit date: 12/24/1997  . Years since quitting: 22.5  Smokeless Tobacco Never Used    Patient has failed these meds in past: n/a Patient is currently controlled on the following medications:   Ventolin HFA 108 mcg 2 puff q6hr PRN   Symbicort 160-4.5 mcg/act 2 puff BID (sometimes only takes daily)   Spiriva 2.5 mcg 2 puff daily   O2 2L at night   Using maintenance inhaler regularly? Yes Frequency of rescue inhaler use:  infrequently once in past 4 months. Patien  We discussed:  proper inhaler technique. Patient sometimes forgets to take his second dose of Symbicort. Otherwise doing fine other than some shortness of breath after exerting himself. He gets his Symbicort and Spiriva through the [place] (3 month supply costs $11)  Plan  Continue current  medications  ,  Diabetes   Managed by Dr. [name] Regularly followed in PREP classes at the [facility]  Recent Relevant Labs: Lab Results  Component Value Date/Time   HGBA1C 6.8 (A) 06/13/2020 04:17 PM   HGBA1C 6.4 (A) 03/02/2020 04:10 PM   HGBA1C 7.1 12/29/2019 12:00 AM   HGBA1C 6.9 (H) 05/11/2015 01:56 PM   HGBA1C 7.0 (H) 02/10/2015 09:33 AM   MICROALBUR 38.0 (H) 02/10/2015 09:33 AM   MICROALBUR 29.8 (H) 02/10/2014 03:00 PM     Checking BG: 3x per Day   Fasting  Lunch Dinner  HS Comments  12-Jul       11-Jul 142  139 154   10-Jul 149  217 231 Had family in town; ate out  7-Jul 160  166 116   6-Jul 135  165 169   5-Jul 165  135 139   4-Jul 146  146 220   Average 150  161 172    Patient has failed these meds in past: acarbose, nateglinide, Fargixa (cost) Patient is currently controlled on the following medications:   Farxiga 2.5 mg daily  Novolog Injection 7 units/ 7 units/ 7 units/ 5 units (HS snack)  Trulicity 9.02 mg weekly   Last diabetic Foot exam:  Lab Results  Component Value Date/Time   HMDIABEYEEXA No Retinopathy 08/07/2019 12:00 AM    Last diabetic Eye exam: No results found for: HMDIABFOOTEX   We discussed: diet and exercise extensively.  Patient has been doing very well on Trulicity. Notes he feels his blood sugars are improved, and he thinks his appetite has been lower overall and that he may have lost some weight. Denies nausea or vomiting. States he has constipation but that is an ongoing problem for him.   Exercise Habits: Continues to do stretching;walking;strength training/weights at the [facility] 60 minutes twice weekly Diet (meal preparation, eat out, water intake, caffeinated beverages, dairy products, fruits and vegetables): Breakfast: protein drink Lunch: pb toast Dinner: Steak, spinach, potatoes  Plan Continue current medications  Drink 64 oz water daily.   Heart Failure   Managed by Dr. Lauree Chandler   Type: Diastolic  Last  ejection fraction: 55-60%  NYHA Class: II (slight limitation of activity) AHA HF Stage: C (Heart disease and symptoms present)  Patient has failed these meds in past: bisoprolol, diltiazem, metolzaone, midodrine, spironolactone, enalapril/lisinopril (cough)  Patient is currently controlled on the following medications:   Carvedilol 25 mg BID   Furosemide 120 mg AM   Imdur 60 mg daily (AM)   We discussed: patient weighing himself regularly. States he has had more shortness of breath recently, but states he typically has more dyspnea during the summer due to the humidity.  Plan  Continue current medications Hypertension   Office blood pressures are  BP Readings from Last 3 Encounters:  06/13/20 100/60  06/07/20 122/62  05/05/20 140/78   CMP Latest Ref Rng & Units 03/02/2020 12/29/2019 08/29/2018  Glucose 70 - 99 mg/dL 207(H) - -  BUN 6 - 23 mg/dL 34(H) - -  Creatinine 0.40 - 1.50 mg/dL 1.44 1.6(A) -  Sodium 135 - 145 mEq/L 135 137 -  Potassium 3.5 - 5.1 mEq/L 4.2 4.0 -  Chloride 96 - 112 mEq/L 96 97(A) -  CO2 19 - 32 mEq/L 33(H) 36(A) -  Calcium 8.4 - 10.5 mg/dL 9.3 9.3 9.4  Total Protein 6.0 - 8.3 g/dL - - -  Total Bilirubin 0.2 - 1.2 mg/dL - - -  Alkaline Phos 39 - 117 U/L - - -  AST 14 - 40 - 8(A) -  ALT 10 - 40 - 21 -   Patient has failed these meds in the past: n/a Patient is currently controlled on the following medications:   Carvedilol 25 mg BID   Furosemide 120 mg AM  Imdur 60 mg daily   Patient checks BP at home daily  Patient home BP readings are ranging: 409-735H systolics  We discussed diet and exercise extensively. Shortness of breath is stable.   Plan  Continue current medications   Hyperlipidemia   History of CAD  requiring multiple PCI and history of PAD.  LDL Goal <70   Lipid Panel     Component Value Date/Time   CHOL 137 12/29/2019 0000   TRIG 71 12/29/2019 0000   HDL 54 12/29/2019 0000   CHOLHDL 3 08/29/2018 1434   VLDL 19.8 08/29/2018 1434    LDLCALC 84 12/29/2019 0000     The ASCVD Risk score Mikey Bussing DC Jr., et al., 2013) failed to calculate for the following reasons:   The patient has a prior MI or stroke diagnosis   Patient has failed these meds in past: n/a Patient is currently uncontrolled on the following medications:   Aspirin 81 mg daily (supper)   Clopidogrel 75 mg daily  Nitroglycerin PRN  Rosuvastatin 10 mg daily (evening meal)  We discussed:  diet and exercise extensively. Denies unusual bruising/bleeding.   Plan Fasting lipid panel ordered, patient instructed to schedule appointment with clinic within next week to assess LDL.  If LDL >70, recommend increasing rosuvastatin to 20 mg daily.   Gout   Uric Acid, Serum  Date Value Ref Range Status  08/29/2018 4.4 4.0 - 7.8 mg/dL Final    Patient has failed these meds in past: n/a Patient is currently controlled on the following medications:   Allopurinol 300 mg daily (AM)  We discussed:  Last flare was a couple of years ago  Plan  Continue current medications  Depression    PHQ9 Score:  PHQ9 SCORE ONLY 03/16/2020 10/09/2018 02/19/2014  PHQ-9 Total Score 0 0 0   GAD7 Score: No flowsheet data found.  Patient has failed these meds in past: n/a Patient is currently controlled on the following medications:  . Citalopram 10 mg daily (AM)  We discussed:  Patient reports mood steady, tolerating citalopram well.  Plan  Continue current medications   GERD   Patient has failed these meds in past: pantoprazole 40 mg q48 hr (rebound symptoms) Patient is currently controlled on the following medications:   Pantoprazole 40 mg daily   We discussed: GERD symptoms stable   Plan Continue current medications   BPH   PSA  Date Value Ref Range Status  08/29/2018 0.75 0.10 - 4.00 ng/ml Final    Comment:    Test performed using Access Hybritech PSA Assay, a parmagnetic partical, chemiluminecent immunoassay.  04/24/2017 0.56 0.10 - 4.00 ng/ml Final   02/22/2016 0.52 0.10 - 4.00 ng/ml Final     Patient has failed these meds in past: n/a Patient is currently controlled on the following medications:  . Finasteride 5 mg daily   We discussed:  No urinary complaints today.   Plan  Continue current medications   Misc/OTC    Azelastin 0.1% nasal spray  Calcitriol 0.25 mcg daily (AM) Cetirizine 10 mg daily PRN  Diphenhydramine 25 mg q6hr PRN (Takes at night)  Guaifenesin daily PRN  Ketoconazole 2% cream Multivitamin daily  Miralax 17 g daily as needed  Potassium chloride 10 mEq daily (AM)  Trazodone 100 mg QHS Triamcinolone 0.025% cream   Plan  Continue current medications  Medication Management   Pt uses [facility] for most of his medications, although he gets a few at the [place] to save on costs. Does not have a pillbox, keeps his medicines in a "golf shoebag" and takes them out when he takes them. States he is very regimented about his medications and does not recall missing any doses recently.   Follow up: 2 months   Doristine Section [phone]

## 2020-07-06 ENCOUNTER — Other Ambulatory Visit: Payer: Self-pay

## 2020-07-06 DIAGNOSIS — K219 Gastro-esophageal reflux disease without esophagitis: Secondary | ICD-10-CM

## 2020-07-06 MED ORDER — ISOSORBIDE MONONITRATE ER 60 MG PO TB24: 60.0000 mg | ORAL_TABLET | Freq: Every day | ORAL | 3 refills | Status: DC

## 2020-07-06 MED ORDER — POTASSIUM CHLORIDE ER 10 MEQ PO TBCR: 10.0000 meq | EXTENDED_RELEASE_TABLET | Freq: Every day | ORAL | 3 refills | Status: DC

## 2020-07-06 MED ORDER — ROSUVASTATIN CALCIUM 10 MG PO TABS: 10.0000 mg | ORAL_TABLET | Freq: Every day | ORAL | 3 refills | Status: DC

## 2020-07-06 MED ORDER — PANTOPRAZOLE SODIUM 40 MG PO TBEC: 40.0000 mg | DELAYED_RELEASE_TABLET | Freq: Every day | ORAL | 3 refills | Status: DC

## 2020-07-06 MED ORDER — TRAZODONE HCL 100 MG PO TABS: ORAL_TABLET | ORAL | 3 refills | Status: DC

## 2020-07-06 MED ORDER — FINASTERIDE 5 MG PO TABS: 5.0000 mg | ORAL_TABLET | Freq: Every day | ORAL | 3 refills | Status: DC

## 2020-07-06 NOTE — Telephone Encounter (Signed)
Last OV 06/07/2020 w/[facility] Last fill for Potassium 08/19/19  #90/3 Last fill for Rosu  10/15/19  #90/3 Last fill for Trazodone 12/02/2019 #90/3

## 2020-07-15 ENCOUNTER — Other Ambulatory Visit: Payer: Self-pay

## 2020-07-15 MED ORDER — CALCITRIOL 0.25 MCG PO CAPS: 0.2500 ug | ORAL_CAPSULE | Freq: Every day | ORAL | 3 refills | Status: DC

## 2020-07-15 MED ORDER — ALLOPURINOL 300 MG PO TABS: ORAL_TABLET | ORAL | 3 refills | Status: DC

## 2020-07-15 MED ORDER — CITALOPRAM HYDROBROMIDE 10 MG PO TABS: ORAL_TABLET | ORAL | 3 refills | Status: DC

## 2020-07-15 NOTE — Telephone Encounter (Signed)
Last OV 02/19/20 Last fill for All 03/04/20  #90/3 Last fill for CIT 10/05/19  #90/3 Last fill for Cal 12/02/19  #90/3

## 2020-07-19 ENCOUNTER — Other Ambulatory Visit: Payer: Self-pay

## 2020-07-20 ENCOUNTER — Other Ambulatory Visit (INDEPENDENT_AMBULATORY_CARE_PROVIDER_SITE_OTHER): Payer: Medicare Other

## 2020-07-20 DIAGNOSIS — E1169 Type 2 diabetes mellitus with other specified complication: Secondary | ICD-10-CM

## 2020-07-20 DIAGNOSIS — E785 Hyperlipidemia, unspecified: Secondary | ICD-10-CM | POA: Diagnosis not present

## 2020-07-20 LAB — LIPID PANEL
Cholesterol: 134 mg/dL (ref 0–200)
HDL: 47 mg/dL (ref >39.00)
LDL Cholesterol: 70 mg/dL (ref 0–99)
NonHDL: 87.29
Total CHOL/HDL Ratio: 3
Triglycerides: 84 mg/dL (ref 0.0–149.0)
VLDL: 16.8 mg/dL (ref 0.0–40.0)

## 2020-08-01 ENCOUNTER — Telehealth: Payer: Self-pay

## 2020-08-01 DIAGNOSIS — Z794 Long term (current) use of insulin: Secondary | ICD-10-CM

## 2020-08-01 DIAGNOSIS — E785 Hyperlipidemia, unspecified: Secondary | ICD-10-CM

## 2020-08-01 NOTE — Progress Notes (Addendum)
Chronic Care Management Pharmacy Assistant   Name: Abdul Beirne.  MRN: [id number] DOB: 21-Apr-1941  Reason for Encounter: Hyperlipdema Medication Review Call.  Patient Questions:  1.  Have you seen any other providers since your last visit? No  2.  Any changes in your medicines or health? No    PCP : [name], DO  Allergies:   Allergies  Allergen Reactions  . Enalapril Maleate Cough    REACTION: cough  . Lisinopril Cough  . Shellfish-Derived Products Swelling    Said occurred twice; has eaten some since and had no reactions    Medications: Outpatient Encounter Medications as of 08/01/2020  Medication Sig  . albuterol (VENTOLIN HFA) 108 (90 Base) MCG/ACT inhaler Inhale 2 puffs into the lungs every 6 (six) hours as needed for wheezing or shortness of breath.  . allopurinol (ZYLOPRIM) 300 MG tablet TAKE 1 TABLET(300 MG) BY MOUTH DAILY  . AMBULATORY NON FORMULARY MEDICATION O2 @ 2LMP @ night  . amoxicillin-clavulanate (AUGMENTIN) 875-125 MG tablet Take 1 tablet by mouth 2 (two) times daily.  Marland Kitchen aspirin 81 MG tablet Take 81 mg by mouth daily.    Marland Kitchen azelastine (ASTELIN) 0.1 % nasal spray Place 2 sprays into both nostrils 2 (two) times daily as needed for rhinitis. Use in each nostril as directed  . B Complex Vitamins (VITAMIN B COMPLEX PO) Take 1 tablet by mouth daily.  . budesonide-formoterol (SYMBICORT) 160-4.5 MCG/ACT inhaler Inhale 2 puffs into the lungs 2 (two) times daily.  . calcitRIOL (ROCALTROL) 0.25 MCG capsule Take 1 capsule (0.25 mcg total) by mouth daily.  . carvedilol (COREG) 25 MG tablet Take 1 tablet (25 mg total) by mouth 2 (two) times daily with a meal.  . cetirizine (ZYRTEC) 10 MG tablet Take 10 mg by mouth as needed for allergies.   . cholecalciferol (VITAMIN D3) 25 MCG (1000 UNIT) tablet Take 1,000 Units by mouth daily.  . citalopram (CELEXA) 10 MG tablet TAKE 1 TABLET(10 MG) BY MOUTH DAILY  . clopidogrel (PLAVIX) 75 MG tablet Take 1 tablet (75 mg  total) by mouth daily with breakfast.  . dapagliflozin propanediol (FARXIGA) 5 MG TABS tablet Take 2.5 mg by mouth daily.  . diphenhydrAMINE (BENADRYL) 25 MG tablet Take 25 mg by mouth every 6 (six) hours as needed.  . Dulaglutide (TRULICITY) 5.57 DU/2.0UR SOPN Inject 0.5 mLs (0.75 mg total) into the skin once a week.  . finasteride (PROSCAR) 5 MG tablet Take 1 tablet (5 mg total) by mouth daily.  . furosemide (LASIX) 40 MG tablet Take 3 tablets by mouth every morning and 2 tablets every afternoon.  . GuaiFENesin (MUCINEX MAXIMUM STRENGTH PO) Take 1 Dose by mouth as needed (drainage).  . insulin aspart (NOVOLOG) 100 UNIT/ML injection Inject 7 Units into the skin 3 (three) times daily with meals. 4 times a day (just before each meal) 09-01-09-5 (if HS snack) units.  . isosorbide mononitrate (IMDUR) 60 MG 24 hr tablet Take 1 tablet (60 mg total) by mouth daily.  Marland Kitchen ketoconazole (NIZORAL) 2 % cream Apply 1 application topically daily as needed for irritation.   . Multiple Vitamins-Minerals (MULTIVITAMIN PO) Take 1 tablet by mouth daily.    . nitroGLYCERIN (NITROLINGUAL) 0.4 MG/SPRAY spray Place 1 spray under the tongue as directed.  . pantoprazole (PROTONIX) 40 MG tablet Take 1 tablet (40 mg total) by mouth daily.  . polyethylene glycol powder (MIRALAX) powder Take 17 g by mouth daily as needed (constipation).   . potassium  chloride (KLOR-CON) 10 MEQ tablet Take 1 tablet (10 mEq total) by mouth daily.  . Protein (UNJURY UNFLAVORED PO) Take 8-16 oz by mouth daily.   . rosuvastatin (CRESTOR) 10 MG tablet Take 1 tablet (10 mg total) by mouth daily.  . Tiotropium Bromide Monohydrate (SPIRIVA RESPIMAT) 2.5 MCG/ACT AERS Inhale 2 puffs into the lungs daily.  . traZODone (DESYREL) 100 MG tablet TAKE 1 TABLET(100 MG) BY MOUTH AT BEDTIME  . triamcinolone (KENALOG) 0.025 % cream Apply 1 application topically 3 (three) times daily as needed (skin).   No facility-administered encounter medications on file as of  08/01/2020.    Current Diagnosis: Patient Active Problem List   Diagnosis Date Noted  . Acute bronchitis with COPD ([place]) 03/26/2018  . Renal failure 08/30/2017  . Abnormal x-ray of lung 08/14/2017  . Hypocalcemia 04/24/2017  . Anemia 04/24/2017  . Low back pain 12/28/2016  . Hemoptysis 10/18/2016  . Chronic respiratory failure with hypoxia and hypercapnia ([place]) 09/04/2016  . Fatigue 06/13/2016  . Diabetes ([place]) 02/23/2016  . Jaw pain 10/25/2015  . Fever 08/08/2015  . Pneumonia 09/10/2014  . CHF exacerbation ([place]) 08/30/2014  . COPD (chronic obstructive pulmonary disease) with chronic bronchitis ([place]) 08/30/2014  . Obesity (BMI 30-39.9) 08/30/2014  . Right lower lobe pneumonia 08/30/2014  . Special screening for malignant neoplasms, colon 07/27/2014  . Family history of colon cancer in mother 07/27/2014  . Acute combined systolic and diastolic heart failure ([place]) 02/10/2014  . Yeast infection 08/12/2013  . Difficulty swallowing solids 07/21/2013  . NSTEMI (non-ST elevated myocardial infarction) ([place]) 07/17/2013  . Sepsis ([place]) 07/16/2013  . Elevated troponin 07/16/2013  . UTI (urinary tract infection) 02/23/2013  . 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  . Encounter for long-term (current) use of other medications 11/24/2012  . Screening for malignant neoplasm of prostate 11/24/2012  . DM (diabetes mellitus) ([place]) 11/24/2012  . History of laparoscopic adjustable gastric banding 03/20/2012  . Depression 12/14/2011  . Physical deconditioning 12/14/2011  . GERD (gastroesophageal reflux disease) 10/31/2011  . Paronychia of great toe of right foot 08/22/2011  . Pre-operative cardiovascular examination 03/21/2011  . ABDOMINAL PAIN 01/18/2011  . OBESITY-MORBID (>100') 06/09/2010  . Iron deficiency anemia 05/01/2010  . CHEST PAIN-PRECORDIAL 12/26/2009  . BASAL CELL CARCINOMA, HX OF 10/31/2009  . HYPERTROPHY PROSTATE W/UR OBST & OTH LUTS 07/27/2009  . FOOT PAIN  07/27/2009  . COUGH DUE TO ACE INHIBITORS 06/14/2009  . OTITIS MEDIA, ACUTE, LEFT 03/14/2009  . GI BLEEDING 02/16/2009  . HYPERTENSION, BENIGN 02/02/2009  . CAD, NATIVE VESSEL 02/02/2009  . DIASTOLIC HEART FAILURE, CHRONIC 02/02/2009  . EDEMA 11/22/2008  . CONJUNCTIVITIS, RIGHT 06/18/2008  . HYPOKALEMIA 06/03/2008  . CHF 06/03/2008  . OSTEOPOROSIS 04/16/2008  . Essential hypertension 04/01/2008  . Gout 03/25/2008  . 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 PERIPHERAL VASCULAR DISEASE 09/01/2007  . Disorder resulting from impaired renal function 09/01/2007  . Controlled diabetes mellitus type 2 with complications ([place]) 29/51/8841  . Dyslipidemia 05/12/2007  . IMPOTENCE 05/12/2007  . 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 Allergic rhinitis 05/12/2007  . COPD (chronic obstructive pulmonary disease) with emphysema ([place]) 05/12/2007  . OSTEOARTHRITIS 05/12/2007  . SINUS HEADACHE 05/12/2007    Goals Addressed   None     Follow-Up:  Pharmacist Review   08/01/2020 Name: Thamas Appleyard. MRN: [id number] DOB: 1941/12/02 [name]. is a 79 y.o. year old male who is a primary care patient of [name], DO.  Comprehensive medication review performed; Spoke to patient regarding cholesterol  Lipid Panel    Component Value Date/Time   CHOL 134 07/20/2020 1113   TRIG 84.0 07/20/2020 1113   HDL 47.00 07/20/2020 1113   LDLCALC 70 07/20/2020 1113    10-year ASCVD risk score: The ASCVD Risk score Mikey Bussing DC Jr., et al., 2013) failed to calculate for the following reasons:   The patient has a prior MI or stroke diagnosis  . Current antihyperlipidemic regimen:  ? Rosuvastatin 10 mg daily  . Previous antihyperlipidemic medications tried: None ID . ASCVD risk enhancing conditions: age >62, DM, HTN, CKD, CHF  . What recent interventions/DTPs have been made by any provider to improve Cholesterol control since last CPP Visit: None ID . Any recent hospitalizations  or ED visits since last visit with CPP? No . What diet changes have been made to improve Cholesterol?  o Patient states he mainly does home cook food. Patient reports he uses a pressure cooker and haven't had salt in years. Patient states he should eat more vegetables, he is working on it.  . What exercise is being done to improve Cholesterol?  o Patient stated he walks but not on a daily basics especially when it is hot outside with having COPD.  Adherence Review: Does the patient have >5 day gap between last estimated fill dates? No   Joycelyn Schmid Clinical Pharmacist Assistant [phone]

## 2020-08-09 DIAGNOSIS — E119 Type 2 diabetes mellitus without complications: Secondary | ICD-10-CM | POA: Diagnosis not present

## 2020-08-09 DIAGNOSIS — H26491 Other secondary cataract, right eye: Secondary | ICD-10-CM | POA: Diagnosis not present

## 2020-08-09 DIAGNOSIS — H52203 Unspecified astigmatism, bilateral: Secondary | ICD-10-CM | POA: Diagnosis not present

## 2020-08-09 LAB — HM DIABETES EYE EXAM

## 2020-08-12 ENCOUNTER — Encounter: Payer: Self-pay | Admitting: Endocrinology

## 2020-08-12 ENCOUNTER — Other Ambulatory Visit: Payer: Self-pay

## 2020-08-12 ENCOUNTER — Ambulatory Visit (INDEPENDENT_AMBULATORY_CARE_PROVIDER_SITE_OTHER): Payer: Medicare Other | Admitting: Endocrinology

## 2020-08-12 VITALS — BP 110/68 | HR 75 | Ht 63.0 in | Wt 218.2 lb

## 2020-08-12 DIAGNOSIS — E1151 Type 2 diabetes mellitus with diabetic peripheral angiopathy without gangrene: Secondary | ICD-10-CM

## 2020-08-12 DIAGNOSIS — Z794 Long term (current) use of insulin: Secondary | ICD-10-CM

## 2020-08-12 LAB — POCT GLYCOSYLATED HEMOGLOBIN (HGB A1C): Hemoglobin A1C: 6.3 % — AB (ref 4.0–5.6)

## 2020-08-12 MED ORDER — INSULIN ASPART 100 UNIT/ML ~~LOC~~ SOLN: 6.0000 [IU] | Freq: Three times a day (TID) | SUBCUTANEOUS | 11 refills | Status: DC

## 2020-08-12 MED ORDER — TRULICITY 1.5 MG/0.5ML ~~LOC~~ SOAJ
1.5000 mg | SUBCUTANEOUS | 11 refills | Status: DC
Start: 2020-08-12 — End: 2020-10-12
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
## 2020-08-12 NOTE — Patient Instructions (Addendum)
I have sent a prescription to your pharmacy, to increase the Trulicity, and: Please reduce the novolog to 6 units 3 times a day (just before each meal), and: Please continue the same [place].   check your blood sugar twice a day.  vary the time of day when you check, between before the 3 meals, and at bedtime.  also check if you have symptoms of your blood sugar being too high or too low.  please keep a record of the readings and bring it to your next appointment here (or you can bring the meter itself).  You can write it on any piece of paper.  please call us sooner if your blood sugar goes below 70, or if you have a lot of readings over 200.  Please come back for a follow-up appointment in 2 months.

## 2020-08-19 ENCOUNTER — Encounter: Payer: Self-pay | Admitting: Family Medicine

## 2020-08-23 ENCOUNTER — Other Ambulatory Visit: Payer: Self-pay | Admitting: Family Medicine

## 2020-08-25 NOTE — Telephone Encounter (Signed)
Pt had normal potassium level in 02/2020 so does not need recheck until after 02/2021

## 2020-08-25 NOTE — Telephone Encounter (Signed)
Medication refill request for Potassium.   02/19/20 Last OV  07/06/20 Last refill date // #90 // refills -3  Too soon for refill. Refill request denied at this time.

## 2020-08-27 DIAGNOSIS — Z23 Encounter for immunization: Secondary | ICD-10-CM | POA: Diagnosis not present

## 2020-09-27 ENCOUNTER — Other Ambulatory Visit: Payer: Self-pay | Admitting: Emergency Medicine

## 2020-09-30 ENCOUNTER — Telehealth: Payer: Self-pay

## 2020-09-30 NOTE — Progress Notes (Signed)
Left a voice message to confirmed patient telephone appointment on 10/03/2020 for CCM at 1:00with Junius Argyle the Clinical pharmacist.   [place] Pharmacist Assistant [phone]

## 2020-10-03 ENCOUNTER — Ambulatory Visit: Payer: Medicare Other

## 2020-10-03 ENCOUNTER — Other Ambulatory Visit: Payer: Self-pay

## 2020-10-03 DIAGNOSIS — Z794 Long term (current) use of insulin: Secondary | ICD-10-CM

## 2020-10-03 DIAGNOSIS — I10 Essential (primary) hypertension: Secondary | ICD-10-CM

## 2020-10-03 DIAGNOSIS — E1151 Type 2 diabetes mellitus with diabetic peripheral angiopathy without gangrene: Secondary | ICD-10-CM

## 2020-10-03 NOTE — Chronic Care Management (AMB) (Signed)
Chronic Care Management Pharmacy  Name: [name].  MRN: [id number] DOB: [birth year]  Chief Complaint/ HPI  [name] Alcon.,  79 y.o. , male presents for their Initial CCM visit with the clinical pharmacist In office.  PCP : [name], DO  Their chronic conditions include: hypertension, chronic heart failure, COPD, type 2 diabetes, hyperlipidemia, GERD, osteoarthritis, osteoporosis, gout  Office Visits: 06/07/20: Patient presented to Wilfred Lacy, NP for follow-up. Patient with pain in jaw line. Patient started on Augmentin x 7 days. Patient referred to ENT. 03/16/20: Televist with [name], RN for AWV. No medication changes noted at that time.  02/19/20: Patient presented to Dr. Bryan Lemma for ear pain/parotitis. Patient started on Augmentin for 10 days.  Consult Visit: 08/12/20: Patient presented to Dr. Loanne Drilling (Endocrinology) for follow-up. A1c improved to 6.3%. Trulicty increased to 1.5 mg weekly, Novolog decreased to 7 units three times weekly  07/01/20: Patient referred to Dr. Lucia Gaskins (otolaryngology) for parotitis. No medication changes made.  06/13/20: Patient presented to Dr. Loanne Drilling for T2DM follow-up. A1c worsened slightly to 6.8%. Jardiance stopped, Insulin NPH stopped (pt not taking). Patient started on Trulicity 5.18 mg weekly, insulin aspart decreased to 7 units TID 03/02/20: Patient presented to Dr. [name] (endocrinology) for DM follow-up. A1c improved from 6.8% to 6.4%. eGFR stable at 47 mL/min. BG range  66-200. Novolog decreased to 9u/9u/10u/5u. Follow-up in 3 months.  02/10/20: Patient presented to Dr. [name] (pulmonology) for pulmonary emphysema. Breathing stable, only reports using his albuterol inhaler a few times weekly with exertion on long walks or when carrying items. No medication changes made.   Medications: Outpatient Encounter Medications as of 10/03/2020  Medication Sig  . albuterol (VENTOLIN HFA) 108 (90 Base) MCG/ACT inhaler  Inhale 2 puffs into the lungs every 6 (six) hours as needed for wheezing or shortness of breath.  . allopurinol (ZYLOPRIM) 300 MG tablet TAKE 1 TABLET(300 MG) BY MOUTH DAILY  . AMBULATORY NON FORMULARY MEDICATION O2 @ 2LMP @ night  . amoxicillin-clavulanate (AUGMENTIN) 875-125 MG tablet Take 1 tablet by mouth 2 (two) times daily.  Marland Kitchen aspirin 81 MG tablet Take 81 mg by mouth daily.    . Azelastine HCl 137 MCG/SPRAY SOLN INSTILL 2 SPRAYS INTO BOTH NOSTRILS TWICE DAILY AS NEEDED FOR RHINITIS.  Marland Kitchen B Complex Vitamins (VITAMIN B COMPLEX PO) Take 1 tablet by mouth daily.  . budesonide-formoterol (SYMBICORT) 160-4.5 MCG/ACT inhaler Inhale 2 puffs into the lungs 2 (two) times daily.  . calcitRIOL (ROCALTROL) 0.25 MCG capsule Take 1 capsule (0.25 mcg total) by mouth daily.  . carvedilol (COREG) 25 MG tablet Take 1 tablet (25 mg total) by mouth 2 (two) times daily with a meal.  . cetirizine (ZYRTEC) 10 MG tablet Take 10 mg by mouth as needed for allergies.   . cholecalciferol (VITAMIN D3) 25 MCG (1000 UNIT) tablet Take 1,000 Units by mouth daily.  . citalopram (CELEXA) 10 MG tablet TAKE 1 TABLET(10 MG) BY MOUTH DAILY  . clopidogrel (PLAVIX) 75 MG tablet Take 1 tablet (75 mg total) by mouth daily with breakfast.  . dapagliflozin propanediol (FARXIGA) 5 MG TABS tablet Take 2.5 mg by mouth daily.  . diphenhydrAMINE (BENADRYL) 25 MG tablet Take 25 mg by mouth every 6 (six) hours as needed.  . Dulaglutide (TRULICITY) 1.5 AC/1.6SA SOPN Inject 0.5 mLs (1.5 mg total) into the skin once a week.  . finasteride (PROSCAR) 5 MG tablet Take 1 tablet (5 mg total) by mouth daily.  . furosemide (LASIX)  40 MG tablet Take 3 tablets by mouth every morning and 2 tablets every afternoon.  . GuaiFENesin (MUCINEX MAXIMUM STRENGTH PO) Take 1 Dose by mouth as needed (drainage).  . insulin aspart (NOVOLOG) 100 UNIT/ML injection Inject 6 Units into the skin 3 (three) times daily with meals.  . isosorbide mononitrate (IMDUR) 60 MG 24 hr  tablet Take 1 tablet (60 mg total) by mouth daily.  Marland Kitchen ketoconazole (NIZORAL) 2 % cream Apply 1 application topically daily as needed for irritation.   . Multiple Vitamins-Minerals (MULTIVITAMIN PO) Take 1 tablet by mouth daily.    . nitroGLYCERIN (NITROLINGUAL) 0.4 MG/SPRAY spray Place 1 spray under the tongue as directed.  . pantoprazole (PROTONIX) 40 MG tablet Take 1 tablet (40 mg total) by mouth daily.  . polyethylene glycol powder (MIRALAX) powder Take 17 g by mouth daily as needed (constipation).   . potassium chloride (KLOR-CON) 10 MEQ tablet Take 1 tablet (10 mEq total) by mouth daily.  . Protein (UNJURY UNFLAVORED PO) Take 8-16 oz by mouth daily.   . rosuvastatin (CRESTOR) 10 MG tablet Take 1 tablet (10 mg total) by mouth daily.  . Tiotropium Bromide Monohydrate (SPIRIVA RESPIMAT) 2.5 MCG/ACT AERS Inhale 2 puffs into the lungs daily.  . traZODone (DESYREL) 100 MG tablet TAKE 1 TABLET(100 MG) BY MOUTH AT BEDTIME  . triamcinolone (KENALOG) 0.025 % cream Apply 1 application topically 3 (three) times daily as needed (skin).   No facility-administered encounter medications on file as of 10/03/2020.     Current Diagnosis/Assessment:  Goals Addressed   None     COPD w/ Emphysema   Last spirometry score: n/a  Gold Grade: Gold 3 (FEV1 30-49%) Current COPD Classification:  B (high sx, <2 exacerbations/yr)   History of recurrent right lower lobe pneumonias   Eosinophil count:   Lab Results  Component Value Date/Time   EOSPCT 3.3 08/29/2018 02:34 PM  %                               Eos (Absolute):  Lab Results  Component Value Date/Time   EOSABS 0.3 08/29/2018 02:34 PM    Tobacco Status:  Social History   Tobacco Use  Smoking Status Former Smoker  . Packs/day: 2.00  . Years: 41.00  . Pack years: 82.00  . Types: Cigarettes  . Quit date: 12/24/1997  . Years since quitting: 22.7  Smokeless Tobacco Never Used    Patient has failed these meds in past: n/a Patient is  currently controlled on the following medications:   Ventolin HFA 108 mcg 2 puff q6hr PRN   Symbicort 160-4.5 mcg/act 2 puff BID (sometimes only takes daily)   Spiriva 2.5 mcg 2 puff daily   O2 2L at night   Using maintenance inhaler regularly? Yes Frequency of rescue inhaler use:  infrequently once in past 4 months. Patien  We discussed:  proper inhaler technique. Patient sometimes forgets to take his second dose of Symbicort. Otherwise doing fine other than some shortness of breath after exerting himself. He gets his Symbicort and Spiriva through the [place] (3 month supply costs $11)  Plan  Continue current medications  ,  Diabetes   Managed by Dr. [name]  Recent Relevant Labs: Lab Results  Component Value Date/Time   HGBA1C 6.3 (A) 08/12/2020 01:24 PM   HGBA1C 6.8 (A) 06/13/2020 04:17 PM   HGBA1C 7.1 12/29/2019 12:00 AM   HGBA1C 6.9 (H) 05/11/2015 01:56 PM  HGBA1C 7.0 (H) 02/10/2015 09:33 AM   MICROALBUR 38.0 (H) 02/10/2015 09:33 AM   MICROALBUR 29.8 (H) 02/10/2014 03:00 PM     Checking BG: 3x per Day Recent BG include:   Fasting Lunch Dinner HS Comments  9-Oct 231 172  135 Unsure why elevated   27-Sep 166 208 182    26-Sep 157 178 128 167   25-Sep 167 167 171    24-Sep 153 167 144 110   23-Sep 157 157 152    Average 172 175 155 137    Patient has failed these meds in past: acarbose, nateglinide, Fargixa (cost) Patient is currently controlled on the following medications:   Farxiga 2.5 mg daily  Novolog Injection 6 units/ 6 units/ 6 units/ 5 units (HS snack) - 9 units if large supper.   Trulicity 1.5 mg weekly   Last diabetic Foot exam:  Lab Results  Component Value Date/Time   HMDIABEYEEXA No Retinopathy 08/09/2020 12:00 AM    Last diabetic Eye exam: No results found for: HMDIABFOOTEX   We discussed: diet and exercise extensively.  Patient has been doing very well on Trulicity. Denies any new / worsening side effects since Trulicity dose increase. He  does report some affordability issues with [place] and Trulicity.   Exercise Habits: Has not been going to the [facility] due to the heat/humidity. States he wants to return tomorrow now that the weather has cooled down.    Diet: Breakfast: protein drink Lunch: pb toast Dinner: Steak, spinach, potatoes   Plan Continue current medications  Will start PAP for Trulicity and Farxiga  Recommend Microalbumin  Heart Failure   Managed by Dr. Lauree Chandler   Type: Diastolic  Last ejection fraction: 55-60%  NYHA Class: II (slight limitation of activity) AHA HF Stage: C (Heart disease and symptoms present)  Patient has failed these meds in past: bisoprolol, diltiazem, metolzaone, midodrine, spironolactone, enalapril/lisinopril (cough)  Patient is currently controlled on the following medications:   Carvedilol 25 mg BID   Furosemide 120 mg AM   Imdur 60 mg daily (AM)   We discussed: patient weighing himself regularly. States he has had more shortness of breath recently, but states he typically has more dyspnea during the summer due to the humidity.   Plan Continue current medications   Hypertension   Office blood pressures are  BP Readings from Last 3 Encounters:  08/12/20 110/68  06/13/20 100/60  06/07/20 122/62   CMP Latest Ref Rng & Units 03/02/2020 12/29/2019 08/29/2018  Glucose 70 - 99 mg/dL 207(H) - -  BUN 6 - 23 mg/dL 34(H) - -  Creatinine 0.40 - 1.50 mg/dL 1.44 1.6(A) -  Sodium 135 - 145 mEq/L 135 137 -  Potassium 3.5 - 5.1 mEq/L 4.2 4.0 -  Chloride 96 - 112 mEq/L 96 97(A) -  CO2 19 - 32 mEq/L 33(H) 36(A) -  Calcium 8.4 - 10.5 mg/dL 9.3 9.3 9.4  Total Protein 6.0 - 8.3 g/dL - - -  Total Bilirubin 0.2 - 1.2 mg/dL - - -  Alkaline Phos 39 - 117 U/L - - -  AST 14 - 40 - 8(A) -  ALT 10 - 40 - 21 -   Patient has failed these meds in the past: n/a Patient is currently controlled on the following medications:   Carvedilol 25 mg BID   Furosemide 120 mg AM  Imdur 60 mg  daily   Patient checks BP at home daily  Patient home BP readings are ranging: 474-259D systolics  We discussed diet and exercise extensively. Shortness of breath is stable.   Plan  Continue current medications   Hyperlipidemia   History of CAD requiring multiple PCI and history of PAD.  LDL Goal <70   Lipid Panel     Component Value Date/Time   CHOL 134 07/20/2020 1113   TRIG 84.0 07/20/2020 1113   HDL 47.00 07/20/2020 1113   CHOLHDL 3 07/20/2020 1113   VLDL 16.8 07/20/2020 1113   LDLCALC 70 07/20/2020 1113     The ASCVD Risk score (Goff DC Jr., et al., 2013) failed to calculate for the following reasons:   The patient has a prior MI or stroke diagnosis   Patient has failed these meds in past: n/a Patient is currently controlled on the following medications:   Aspirin 81 mg daily (supper)   Clopidogrel 75 mg daily  Nitroglycerin PRN  Rosuvastatin 10 mg daily (evening meal)  We discussed:  diet and exercise extensively. Denies unusual bruising/bleeding.   Plan Continue current medications  Gout   Uric Acid, Serum  Date Value Ref Range Status  08/29/2018 4.4 4.0 - 7.8 mg/dL Final    Patient has failed these meds in past: n/a Patient is currently controlled on the following medications:   Allopurinol 300 mg daily (AM)  We discussed:  Last flare was a couple of years ago  Plan  Continue current medications  Depression    PHQ9 Score:  PHQ9 SCORE ONLY 03/16/2020 10/09/2018 02/19/2014  PHQ-9 Total Score 0 0 0   GAD7 Score: No flowsheet data found.  Patient has failed these meds in past: n/a Patient is currently controlled on the following medications:  . Citalopram 10 mg daily (AM)  We discussed:  Patient reports mood steady, tolerating citalopram well.  Plan  Continue current medications   GERD   Patient has failed these meds in past: pantoprazole 40 mg q48 hr (rebound symptoms) Patient is currently controlled on the following medications:    Pantoprazole 40 mg daily   We discussed: GERD symptoms stable   Plan Continue current medications   BPH   PSA  Date Value Ref Range Status  08/29/2018 0.75 0.10 - 4.00 ng/ml Final    Comment:    Test performed using Access Hybritech PSA Assay, a parmagnetic partical, chemiluminecent immunoassay.  04/24/2017 0.56 0.10 - 4.00 ng/ml Final  02/22/2016 0.52 0.10 - 4.00 ng/ml Final     Patient has failed these meds in past: n/a Patient is currently controlled on the following medications:  . Finasteride 5 mg daily   We discussed:  No urinary complaints today.   Plan  Continue current medications   Misc/OTC    Azelastin 0.1% nasal spray  Calcitriol 0.25 mcg daily (AM) Cetirizine 10 mg daily PRN  Diphenhydramine 25 mg q6hr PRN (Takes at night)  Guaifenesin daily PRN  Ketoconazole 2% cream Multivitamin daily  Miralax 17 g daily as needed  Potassium chloride 10 mEq daily (AM)  Trazodone 100 mg QHS Triamcinolone 0.025% cream   Plan  Continue current medications  Medication Management   Pt uses [facility] for most of his medications, although he gets a few at the [place] to save on costs. Does not have a pillbox, keeps his medicines in a "golf shoebag" and takes them out when he takes them. States he is very regimented about his medications and does not recall missing any doses recently.   Follow up: 6 months   Doristine Section [phone]

## 2020-10-04 ENCOUNTER — Telehealth: Payer: Self-pay

## 2020-10-04 ENCOUNTER — Ambulatory Visit (INDEPENDENT_AMBULATORY_CARE_PROVIDER_SITE_OTHER): Payer: Medicare Other

## 2020-10-04 DIAGNOSIS — Z23 Encounter for immunization: Secondary | ICD-10-CM | POA: Diagnosis not present

## 2020-10-04 NOTE — Progress Notes (Signed)
Chronic Care Management Pharmacy Assistant   Name: [name].  MRN: [id number] DOB: [birth year]  Reason for Encounter: Medication Review /Patient assistance for [place] and Trulicity.    PCP : [name], DO  Allergies:   Allergies  Allergen Reactions  . Enalapril Maleate Cough    REACTION: cough  . Lisinopril Cough  . Shellfish-Derived Products Swelling    Said occurred twice; has eaten some since and had no reactions    Medications: Outpatient Encounter Medications as of 10/04/2020  Medication Sig  . albuterol (VENTOLIN HFA) 108 (90 Base) MCG/ACT inhaler Inhale 2 puffs into the lungs every 6 (six) hours as needed for wheezing or shortness of breath.  . allopurinol (ZYLOPRIM) 300 MG tablet TAKE 1 TABLET(300 MG) BY MOUTH DAILY  . AMBULATORY NON FORMULARY MEDICATION O2 @ 2LMP @ night  . amoxicillin-clavulanate (AUGMENTIN) 875-125 MG tablet Take 1 tablet by mouth 2 (two) times daily.  Marland Kitchen aspirin 81 MG tablet Take 81 mg by mouth daily.    . Azelastine HCl 137 MCG/SPRAY SOLN INSTILL 2 SPRAYS INTO BOTH NOSTRILS TWICE DAILY AS NEEDED FOR RHINITIS.  Marland Kitchen B Complex Vitamins (VITAMIN B COMPLEX PO) Take 1 tablet by mouth daily.  . budesonide-formoterol (SYMBICORT) 160-4.5 MCG/ACT inhaler Inhale 2 puffs into the lungs 2 (two) times daily.  . calcitRIOL (ROCALTROL) 0.25 MCG capsule Take 1 capsule (0.25 mcg total) by mouth daily.  . carvedilol (COREG) 25 MG tablet Take 1 tablet (25 mg total) by mouth 2 (two) times daily with a meal.  . cetirizine (ZYRTEC) 10 MG tablet Take 10 mg by mouth as needed for allergies.   . cholecalciferol (VITAMIN D3) 25 MCG (1000 UNIT) tablet Take 1,000 Units by mouth daily.  . citalopram (CELEXA) 10 MG tablet TAKE 1 TABLET(10 MG) BY MOUTH DAILY  . clopidogrel (PLAVIX) 75 MG tablet Take 1 tablet (75 mg total) by mouth daily with breakfast.  . dapagliflozin propanediol (FARXIGA) 5 MG TABS tablet Take 2.5 mg by mouth daily.  . diphenhydrAMINE  (BENADRYL) 25 MG tablet Take 25 mg by mouth every 6 (six) hours as needed.  . Dulaglutide (TRULICITY) 1.5 QA/8.3MH SOPN Inject 0.5 mLs (1.5 mg total) into the skin once a week.  . finasteride (PROSCAR) 5 MG tablet Take 1 tablet (5 mg total) by mouth daily.  . furosemide (LASIX) 40 MG tablet Take 3 tablets by mouth every morning and 2 tablets every afternoon.  . GuaiFENesin (MUCINEX MAXIMUM STRENGTH PO) Take 1 Dose by mouth as needed (drainage).  . insulin aspart (NOVOLOG) 100 UNIT/ML injection Inject 6 Units into the skin 3 (three) times daily with meals.  . isosorbide mononitrate (IMDUR) 60 MG 24 hr tablet Take 1 tablet (60 mg total) by mouth daily.  Marland Kitchen ketoconazole (NIZORAL) 2 % cream Apply 1 application topically daily as needed for irritation.   . Multiple Vitamins-Minerals (MULTIVITAMIN PO) Take 1 tablet by mouth daily.    . nitroGLYCERIN (NITROLINGUAL) 0.4 MG/SPRAY spray Place 1 spray under the tongue as directed.  . pantoprazole (PROTONIX) 40 MG tablet Take 1 tablet (40 mg total) by mouth daily.  . polyethylene glycol powder (MIRALAX) powder Take 17 g by mouth daily as needed (constipation).   . potassium chloride (KLOR-CON) 10 MEQ tablet Take 1 tablet (10 mEq total) by mouth daily.  . Protein (UNJURY UNFLAVORED PO) Take 8-16 oz by mouth daily.   . rosuvastatin (CRESTOR) 10 MG tablet Take 1 tablet (10 mg total) by mouth daily.  . Tiotropium  Bromide Monohydrate (SPIRIVA RESPIMAT) 2.5 MCG/ACT AERS Inhale 2 puffs into the lungs daily.  . traZODone (DESYREL) 100 MG tablet TAKE 1 TABLET(100 MG) BY MOUTH AT BEDTIME  . triamcinolone (KENALOG) 0.025 % cream Apply 1 application topically 3 (three) times daily as needed (skin).   No facility-administered encounter medications on file as of 10/04/2020.    Current Diagnosis: Patient Active Problem List   Diagnosis Date Noted  . Acute bronchitis with COPD ([place]) 03/26/2018  . Renal failure 08/30/2017  . Abnormal x-ray of lung 08/14/2017  .  Hypocalcemia 04/24/2017  . Anemia 04/24/2017  . Low back pain 12/28/2016  . Hemoptysis 10/18/2016  . Chronic respiratory failure with hypoxia and hypercapnia ([place]) 09/04/2016  . Fatigue 06/13/2016  . Diabetes ([place]) 02/23/2016  . Jaw pain 10/25/2015  . Fever 08/08/2015  . Pneumonia 09/10/2014  . CHF exacerbation ([place]) 08/30/2014  . COPD (chronic obstructive pulmonary disease) with chronic bronchitis ([place]) 08/30/2014  . Obesity (BMI 30-39.9) 08/30/2014  . Right lower lobe pneumonia 08/30/2014  . Special screening for malignant neoplasms, colon 07/27/2014  . Family history of colon cancer in mother 07/27/2014  . Acute combined systolic and diastolic heart failure ([place]) 02/10/2014  . Yeast infection 08/12/2013  . Difficulty swallowing solids 07/21/2013  . NSTEMI (non-ST elevated myocardial infarction) ([place]) 07/17/2013  . Sepsis ([place]) 07/16/2013  . Elevated troponin 07/16/2013  . UTI (urinary tract infection) 02/23/2013  . 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  . Encounter for long-term (current) use of other medications 11/24/2012  . Screening for malignant neoplasm of prostate 11/24/2012  . DM (diabetes mellitus) ([place]) 11/24/2012  . History of laparoscopic adjustable gastric banding 03/20/2012  . Depression 12/14/2011  . Physical deconditioning 12/14/2011  . GERD (gastroesophageal reflux disease) 10/31/2011  . Paronychia of great toe of right foot 08/22/2011  . Pre-operative cardiovascular examination 03/21/2011  . ABDOMINAL PAIN 01/18/2011  . OBESITY-MORBID (>100') 06/09/2010  . Iron deficiency anemia 05/01/2010  . CHEST PAIN-PRECORDIAL 12/26/2009  . BASAL CELL CARCINOMA, HX OF 10/31/2009  . HYPERTROPHY PROSTATE W/UR OBST & OTH LUTS 07/27/2009  . FOOT PAIN 07/27/2009  . COUGH DUE TO ACE INHIBITORS 06/14/2009  . OTITIS MEDIA, ACUTE, LEFT 03/14/2009  . GI BLEEDING 02/16/2009  . HYPERTENSION, BENIGN 02/02/2009  . CAD, NATIVE VESSEL 02/02/2009  . DIASTOLIC HEART  FAILURE, CHRONIC 02/02/2009  . EDEMA 11/22/2008  . CONJUNCTIVITIS, RIGHT 06/18/2008  . HYPOKALEMIA 06/03/2008  . CHF 06/03/2008  . OSTEOPOROSIS 04/16/2008  . Essential hypertension 04/01/2008  . Gout 03/25/2008  . 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 PERIPHERAL VASCULAR DISEASE 09/01/2007  . Disorder resulting from impaired renal function 09/01/2007  . Controlled diabetes mellitus type 2 with complications ([place]) 43/32/9518  . Dyslipidemia 05/12/2007  . IMPOTENCE 05/12/2007  . 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 Allergic rhinitis 05/12/2007  . COPD (chronic obstructive pulmonary disease) with emphysema ([place]) 05/12/2007  . OSTEOARTHRITIS 05/12/2007  . SINUS HEADACHE 05/12/2007    Follow-Up:  Patient Assistance Coordination   Spoke to patient to inform him that we are sending her a Patient assistance form  for [place] and Trulicity by mail.Informed patient to include a copy of his proof of income AND a copy of his Explanation of Benefits (EOB) statement from his insurance.Advised patient to return the PAP forms back to the [place] office.

## 2020-10-04 NOTE — Progress Notes (Signed)
Per orders of Dr Fransico Him, injection of Influenza given by Armandina Gemma, cma.  Patient tolerated injection well.

## 2020-10-12 ENCOUNTER — Other Ambulatory Visit: Payer: Self-pay

## 2020-10-12 ENCOUNTER — Encounter: Payer: Self-pay | Admitting: Endocrinology

## 2020-10-12 ENCOUNTER — Ambulatory Visit (INDEPENDENT_AMBULATORY_CARE_PROVIDER_SITE_OTHER): Payer: Medicare Other | Admitting: Endocrinology

## 2020-10-12 VITALS — BP 112/60 | HR 86 | Ht 63.0 in | Wt 212.0 lb

## 2020-10-12 DIAGNOSIS — Z794 Long term (current) use of insulin: Secondary | ICD-10-CM

## 2020-10-12 DIAGNOSIS — E1151 Type 2 diabetes mellitus with diabetic peripheral angiopathy without gangrene: Secondary | ICD-10-CM | POA: Diagnosis not present

## 2020-10-12 LAB — POCT GLYCOSYLATED HEMOGLOBIN (HGB A1C): Hemoglobin A1C: 6.2 % — AB (ref 4.0–5.6)

## 2020-10-12 MED ORDER — TRULICITY 3 MG/0.5ML ~~LOC~~ SOAJ: 3.0000 mg | SUBCUTANEOUS | 3 refills | Status: DC

## 2020-10-12 MED ORDER — INSULIN ASPART 100 UNIT/ML ~~LOC~~ SOLN
4.0000 [IU] | Freq: Three times a day (TID) | SUBCUTANEOUS | 11 refills | Status: DC
Start: 2020-10-12 — End: 2021-01-24

## 2020-10-12 NOTE — Patient Instructions (Addendum)
I have sent a prescription to your pharmacy, to increase the Trulicity again, and: Please reduce the novolog to 4 units, 3 times a day (just before each meal), and: Please continue the same [place].   check your blood sugar twice a day.  vary the time of day when you check, between before the 3 meals, and at bedtime.  also check if you have symptoms of your blood sugar being too high or too low.  please keep a record of the readings and bring it to your next appointment here (or you can bring the meter itself).  You can write it on any piece of paper.  please call us sooner if your blood sugar goes below 70, or if you have a lot of readings over 200.  Please come back for a follow-up appointment in 3 months.
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
## 2020-10-17 NOTE — Progress Notes (Signed)
Key: [id number] - PA Case ID: [id number] - Rx #: [id number] Need help? Call us at [phone] Status Sent to [facility] Trulicity 3MG /0.5ML pen-injectors [place] [phone]

## 2020-11-01 ENCOUNTER — Telehealth: Payer: Self-pay

## 2020-11-01 DIAGNOSIS — E1151 Type 2 diabetes mellitus with diabetic peripheral angiopathy without gangrene: Secondary | ICD-10-CM

## 2020-11-01 DIAGNOSIS — Z794 Long term (current) use of insulin: Secondary | ICD-10-CM

## 2020-11-01 NOTE — Progress Notes (Addendum)
Chronic Care Management Pharmacy Assistant   Name: [name].  MRN: [id number] DOB: [birth year]  Reason for Encounter:Diabetes  Disease State Call.  PCP : [name], DO  Allergies:   Allergies  Allergen Reactions  . Enalapril Maleate Cough    REACTION: cough  . Lisinopril Cough  . Shellfish-Derived Products Swelling    Said occurred twice; has eaten some since and had no reactions    Medications: Outpatient Encounter Medications as of 11/01/2020  Medication Sig  . albuterol (VENTOLIN HFA) 108 (90 Base) MCG/ACT inhaler Inhale 2 puffs into the lungs every 6 (six) hours as needed for wheezing or shortness of breath.  . allopurinol (ZYLOPRIM) 300 MG tablet TAKE 1 TABLET(300 MG) BY MOUTH DAILY  . AMBULATORY NON FORMULARY MEDICATION O2 @ 2LMP @ night  . amoxicillin-clavulanate (AUGMENTIN) 875-125 MG tablet Take 1 tablet by mouth 2 (two) times daily.  Marland Kitchen aspirin 81 MG tablet Take 81 mg by mouth daily.    . Azelastine HCl 137 MCG/SPRAY SOLN INSTILL 2 SPRAYS INTO BOTH NOSTRILS TWICE DAILY AS NEEDED FOR RHINITIS.  Marland Kitchen B Complex Vitamins (VITAMIN B COMPLEX PO) Take 1 tablet by mouth daily.  . budesonide-formoterol (SYMBICORT) 160-4.5 MCG/ACT inhaler Inhale 2 puffs into the lungs 2 (two) times daily.  . calcitRIOL (ROCALTROL) 0.25 MCG capsule Take 1 capsule (0.25 mcg total) by mouth daily.  . carvedilol (COREG) 25 MG tablet Take 1 tablet (25 mg total) by mouth 2 (two) times daily with a meal.  . cetirizine (ZYRTEC) 10 MG tablet Take 10 mg by mouth as needed for allergies.   . cholecalciferol (VITAMIN D3) 25 MCG (1000 UNIT) tablet Take 1,000 Units by mouth daily.  . citalopram (CELEXA) 10 MG tablet TAKE 1 TABLET(10 MG) BY MOUTH DAILY  . clopidogrel (PLAVIX) 75 MG tablet Take 1 tablet (75 mg total) by mouth daily with breakfast.  . dapagliflozin propanediol (FARXIGA) 5 MG TABS tablet Take 2.5 mg by mouth daily.  . diphenhydrAMINE (BENADRYL) 25 MG tablet Take 25 mg by mouth every  6 (six) hours as needed.  . Dulaglutide (TRULICITY) 3 YJ/8.5UD SOPN Inject 3 mg as directed once a week.  . finasteride (PROSCAR) 5 MG tablet Take 1 tablet (5 mg total) by mouth daily.  . furosemide (LASIX) 40 MG tablet Take 3 tablets by mouth every morning and 2 tablets every afternoon.  . GuaiFENesin (MUCINEX MAXIMUM STRENGTH PO) Take 1 Dose by mouth as needed (drainage).  . insulin aspart (NOVOLOG) 100 UNIT/ML injection Inject 4 Units into the skin 3 (three) times daily with meals.  . isosorbide mononitrate (IMDUR) 60 MG 24 hr tablet Take 1 tablet (60 mg total) by mouth daily.  Marland Kitchen ketoconazole (NIZORAL) 2 % cream Apply 1 application topically daily as needed for irritation.   . Multiple Vitamins-Minerals (MULTIVITAMIN PO) Take 1 tablet by mouth daily.    . nitroGLYCERIN (NITROLINGUAL) 0.4 MG/SPRAY spray Place 1 spray under the tongue as directed.  . pantoprazole (PROTONIX) 40 MG tablet Take 1 tablet (40 mg total) by mouth daily.  . polyethylene glycol powder (MIRALAX) powder Take 17 g by mouth daily as needed (constipation).   . potassium chloride (KLOR-CON) 10 MEQ tablet Take 1 tablet (10 mEq total) by mouth daily.  . Protein (UNJURY UNFLAVORED PO) Take 8-16 oz by mouth daily.   . rosuvastatin (CRESTOR) 10 MG tablet Take 1 tablet (10 mg total) by mouth daily.  . Tiotropium Bromide Monohydrate (SPIRIVA RESPIMAT) 2.5 MCG/ACT AERS Inhale 2 puffs  into the lungs daily.  . traZODone (DESYREL) 100 MG tablet TAKE 1 TABLET(100 MG) BY MOUTH AT BEDTIME  . triamcinolone (KENALOG) 0.025 % cream Apply 1 application topically 3 (three) times daily as needed (skin).   No facility-administered encounter medications on file as of 11/01/2020.    Current Diagnosis: Patient Active Problem List   Diagnosis Date Noted  . Acute bronchitis with COPD ([place]) 03/26/2018  . Renal failure 08/30/2017  . Abnormal x-ray of lung 08/14/2017  . Hypocalcemia 04/24/2017  . Anemia 04/24/2017  . Low back pain 12/28/2016  .  Hemoptysis 10/18/2016  . Chronic respiratory failure with hypoxia and hypercapnia ([place]) 09/04/2016  . Fatigue 06/13/2016  . Diabetes ([place]) 02/23/2016  . Jaw pain 10/25/2015  . Fever 08/08/2015  . Pneumonia 09/10/2014  . CHF exacerbation ([place]) 08/30/2014  . COPD (chronic obstructive pulmonary disease) with chronic bronchitis ([place]) 08/30/2014  . Obesity (BMI 30-39.9) 08/30/2014  . Right lower lobe pneumonia 08/30/2014  . Special screening for malignant neoplasms, colon 07/27/2014  . Family history of colon cancer in mother 07/27/2014  . Acute combined systolic and diastolic heart failure ([place]) 02/10/2014  . Yeast infection 08/12/2013  . Difficulty swallowing solids 07/21/2013  . NSTEMI (non-ST elevated myocardial infarction) ([place]) 07/17/2013  . Sepsis ([place]) 07/16/2013  . Elevated troponin 07/16/2013  . UTI (urinary tract infection) 02/23/2013  . 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  . Encounter for long-term (current) use of other medications 11/24/2012  . Screening for malignant neoplasm of prostate 11/24/2012  . DM (diabetes mellitus) ([place]) 11/24/2012  . History of laparoscopic adjustable gastric banding 03/20/2012  . Depression 12/14/2011  . Physical deconditioning 12/14/2011  . GERD (gastroesophageal reflux disease) 10/31/2011  . Paronychia of great toe of right foot 08/22/2011  . Pre-operative cardiovascular examination 03/21/2011  . ABDOMINAL PAIN 01/18/2011  . OBESITY-MORBID (>100') 06/09/2010  . Iron deficiency anemia 05/01/2010  . CHEST PAIN-PRECORDIAL 12/26/2009  . BASAL CELL CARCINOMA, HX OF 10/31/2009  . HYPERTROPHY PROSTATE W/UR OBST & OTH LUTS 07/27/2009  . FOOT PAIN 07/27/2009  . COUGH DUE TO ACE INHIBITORS 06/14/2009  . OTITIS MEDIA, ACUTE, LEFT 03/14/2009  . GI BLEEDING 02/16/2009  . HYPERTENSION, BENIGN 02/02/2009  . CAD, NATIVE VESSEL 02/02/2009  . DIASTOLIC HEART FAILURE, CHRONIC 02/02/2009  . EDEMA 11/22/2008  . CONJUNCTIVITIS, RIGHT  06/18/2008  . HYPOKALEMIA 06/03/2008  . CHF 06/03/2008  . OSTEOPOROSIS 04/16/2008  . Essential hypertension 04/01/2008  . Gout 03/25/2008  . Secondary renal hyperparathyroidism ([place]) 03/25/2008  . PERIPHERAL VASCULAR DISEASE 09/01/2007  . Disorder resulting from impaired renal function 09/01/2007  . Controlled diabetes mellitus type 2 with complications ([place]) 84/16/6063  . Dyslipidemia 05/12/2007  . IMPOTENCE 05/12/2007  . 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 Allergic rhinitis 05/12/2007  . COPD (chronic obstructive pulmonary disease) with emphysema ([place]) 05/12/2007  . OSTEOARTHRITIS 05/12/2007  . SINUS HEADACHE 05/12/2007    Follow-Up:  Pharmacist Review   Recent Relevant Labs: Lab Results  Component Value Date/Time   HGBA1C 6.2 (A) 10/12/2020 01:46 PM   HGBA1C 6.3 (A) 08/12/2020 01:24 PM   HGBA1C 7.1 12/29/2019 12:00 AM   HGBA1C 6.9 (H) 05/11/2015 01:56 PM   HGBA1C 7.0 (H) 02/10/2015 09:33 AM   MICROALBUR 38.0 (H) 02/10/2015 09:33 AM   MICROALBUR 29.8 (H) 02/10/2014 03:00 PM    Kidney Function Lab Results  Component Value Date/Time   CREATININE 1.44 03/02/2020 04:39 PM   CREATININE 1.6 (A) 12/29/2019 12:00 AM   CREATININE 1.66 (H)  08/29/2018 02:34 PM   CREATININE 1.35 05/10/2014 01:46 PM   GFR 47.47 (L) 03/02/2020 04:39 PM   GFRNONAA 51 (L) 08/20/2017 03:42 PM   GFRNONAA 52 (L) 05/10/2014 01:46 PM   GFRAA 59 (L) 08/20/2017 03:42 PM   GFRAA 60 05/10/2014 01:46 PM    . Current antihyperglycemic regimen:     Farxiga 2.5 mg daily             Novolog Injection  4 units, 3 times a day (just before each meal)              Trulicity 3 mg weekly   . What recent interventions/DTPs have been made to improve glycemic control:  o 10/12/2020 Endocrinology [name] reduce the novolog to 4 units, 3 times a day (just before each meal) o 10/12/2020 Endocrinology [name] Increase Trulicity 3 MG Weekly.  . Have there been any recent hospitalizations or ED visits since  last visit with CPP? No . Patient denies hypoglycemic symptoms, including Pale, Sweaty, Shaky, Hungry, Nervous/irritable and Vision changes . Patient denies hyperglycemic symptoms, including blurry vision, excessive thirst, fatigue, polyuria and weakness . How often are you checking your blood sugar? 3-4 times daily . What are your blood sugars ranging?  o Fasting: N/A o Before meals:  - On 10/31 it was 149, 173, 120 - On 11/01 it was 154, 193, 179 - On 11/02 it was 156, 197, 123 - On 11/03 it was 165,180 - On 11/04 it was 145, 182, 192, 191 - On 11/05 it was 166,102 o After meals:  - On 11/01 it was 212 - On 11/03 it was 217 (after a snack) - On 11/05 it was 277 o Bedtime:  - On 11/01 it was 118  . During the week, how often does your blood glucose drop below 70? Never . Are you checking your feet daily/regularly?   Patient denies numbness, pain or tingling sensation in his feet.  Adherence Review: Is the patient currently on a STATIN medication? Yes Is the patient currently on ACE/ARB medication? No Does the patient have >5 day gap between last estimated fill dates? Yes   [place] Pharmacist Assistant [phone]   Addendum 11/09/20: Patient with elevated post-prandial readings. Will continue to monitor for now. Endocrinology informed.   Doristine Section Clinical Pharmacist [facility]  [phone]

## 2020-11-15 NOTE — Progress Notes (Signed)
Called patient, notified I had faxed over page 6 of consent today.  Patient verbalized understanding.

## 2020-11-15 NOTE — Progress Notes (Signed)
Per pt phone call: Pt states Page 6 (consent) was not received via fax. Please fax page 6 so they can process app for patient assistance Trulicity.  Please call pt confirm when done. Pt [phone]

## 2020-11-21 NOTE — Progress Notes (Signed)
Received notification from [facility] that patient was approved and enrolled in [facility] for a 12 month period.  Will send to scan.

## 2020-11-26 ENCOUNTER — Other Ambulatory Visit: Payer: Self-pay | Admitting: Cardiovascular Disease

## 2020-11-29 ENCOUNTER — Telehealth: Payer: Self-pay

## 2020-11-29 NOTE — Progress Notes (Signed)
Chronic Care Management Pharmacy Assistant   Name: [name].  MRN: [id number] DOB: 12/07/1941  Reason for Encounter:Diabetes Disease State Call.  PCP : [name], DO  Allergies:   Allergies  Allergen Reactions  . Enalapril Maleate Cough    REACTION: cough  . Lisinopril Cough  . Shellfish-Derived Products Swelling    Said occurred twice; has eaten some since and had no reactions    Medications: Outpatient Encounter Medications as of 11/29/2020  Medication Sig  . albuterol (VENTOLIN HFA) 108 (90 Base) MCG/ACT inhaler Inhale 2 puffs into the lungs every 6 (six) hours as needed for wheezing or shortness of breath.  . allopurinol (ZYLOPRIM) 300 MG tablet TAKE 1 TABLET(300 MG) BY MOUTH DAILY  . AMBULATORY NON FORMULARY MEDICATION O2 @ 2LMP @ night  . amoxicillin-clavulanate (AUGMENTIN) 875-125 MG tablet Take 1 tablet by mouth 2 (two) times daily.  Marland Kitchen aspirin 81 MG tablet Take 81 mg by mouth daily.    . Azelastine HCl 137 MCG/SPRAY SOLN INSTILL 2 SPRAYS INTO BOTH NOSTRILS TWICE DAILY AS NEEDED FOR RHINITIS.  Marland Kitchen B Complex Vitamins (VITAMIN B COMPLEX PO) Take 1 tablet by mouth daily.  . budesonide-formoterol (SYMBICORT) 160-4.5 MCG/ACT inhaler Inhale 2 puffs into the lungs 2 (two) times daily.  . calcitRIOL (ROCALTROL) 0.25 MCG capsule Take 1 capsule (0.25 mcg total) by mouth daily.  . carvedilol (COREG) 25 MG tablet Take 1 tablet (25 mg total) by mouth 2 (two) times daily with a meal. Pt needs to keep upcoming appt in Feb, 2022 for further refills  . cetirizine (ZYRTEC) 10 MG tablet Take 10 mg by mouth as needed for allergies.   . cholecalciferol (VITAMIN D3) 25 MCG (1000 UNIT) tablet Take 1,000 Units by mouth daily.  . citalopram (CELEXA) 10 MG tablet TAKE 1 TABLET(10 MG) BY MOUTH DAILY  . clopidogrel (PLAVIX) 75 MG tablet Take 1 tablet (75 mg total) by mouth daily. Pt needs to keep upcoming appt in Feb, 2022 for further refills  . dapagliflozin propanediol (FARXIGA) 5  MG TABS tablet Take 2.5 mg by mouth daily.  . diphenhydrAMINE (BENADRYL) 25 MG tablet Take 25 mg by mouth every 6 (six) hours as needed.  . Dulaglutide (TRULICITY) 3 ZJ/6.7HA SOPN Inject 3 mg as directed once a week.  . finasteride (PROSCAR) 5 MG tablet Take 1 tablet (5 mg total) by mouth daily.  . furosemide (LASIX) 40 MG tablet Take 3 tablets by mouth every morning and 2 tablets every afternoon.  . GuaiFENesin (MUCINEX MAXIMUM STRENGTH PO) Take 1 Dose by mouth as needed (drainage).  . insulin aspart (NOVOLOG) 100 UNIT/ML injection Inject 4 Units into the skin 3 (three) times daily with meals.  . isosorbide mononitrate (IMDUR) 60 MG 24 hr tablet Take 1 tablet (60 mg total) by mouth daily.  Marland Kitchen ketoconazole (NIZORAL) 2 % cream Apply 1 application topically daily as needed for irritation.   . Multiple Vitamins-Minerals (MULTIVITAMIN PO) Take 1 tablet by mouth daily.    . nitroGLYCERIN (NITROLINGUAL) 0.4 MG/SPRAY spray Place 1 spray under the tongue as directed.  . pantoprazole (PROTONIX) 40 MG tablet Take 1 tablet (40 mg total) by mouth daily.  . polyethylene glycol powder (MIRALAX) powder Take 17 g by mouth daily as needed (constipation).   . potassium chloride (KLOR-CON) 10 MEQ tablet Take 1 tablet (10 mEq total) by mouth daily.  . Protein (UNJURY UNFLAVORED PO) Take 8-16 oz by mouth daily.   . rosuvastatin (CRESTOR) 10 MG tablet Take  1 tablet (10 mg total) by mouth daily.  . Tiotropium Bromide Monohydrate (SPIRIVA RESPIMAT) 2.5 MCG/ACT AERS Inhale 2 puffs into the lungs daily.  . traZODone (DESYREL) 100 MG tablet TAKE 1 TABLET(100 MG) BY MOUTH AT BEDTIME  . triamcinolone (KENALOG) 0.025 % cream Apply 1 application topically 3 (three) times daily as needed (skin).   No facility-administered encounter medications on file as of 11/29/2020.    Current Diagnosis: Patient Active Problem List   Diagnosis Date Noted  . Acute bronchitis with COPD ([place]) 03/26/2018  . Renal failure 08/30/2017  .  Abnormal x-ray of lung 08/14/2017  . Hypocalcemia 04/24/2017  . Anemia 04/24/2017  . Low back pain 12/28/2016  . Hemoptysis 10/18/2016  . Chronic respiratory failure with hypoxia and hypercapnia ([place]) 09/04/2016  . Fatigue 06/13/2016  . Diabetes ([place]) 02/23/2016  . Jaw pain 10/25/2015  . Fever 08/08/2015  . Pneumonia 09/10/2014  . CHF exacerbation ([place]) 08/30/2014  . COPD (chronic obstructive pulmonary disease) with chronic bronchitis ([place]) 08/30/2014  . Obesity (BMI 30-39.9) 08/30/2014  . Right lower lobe pneumonia 08/30/2014  . Special screening for malignant neoplasms, colon 07/27/2014  . Family history of colon cancer in mother 07/27/2014  . Acute combined systolic and diastolic heart failure ([place]) 02/10/2014  . Yeast infection 08/12/2013  . Difficulty swallowing solids 07/21/2013  . NSTEMI (non-ST elevated myocardial infarction) ([place]) 07/17/2013  . Sepsis ([place]) 07/16/2013  . Elevated troponin 07/16/2013  . UTI (urinary tract infection) 02/23/2013  . 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  . Encounter for long-term (current) use of other medications 11/24/2012  . Screening for malignant neoplasm of prostate 11/24/2012  . DM (diabetes mellitus) ([place]) 11/24/2012  . History of laparoscopic adjustable gastric banding 03/20/2012  . Depression 12/14/2011  . Physical deconditioning 12/14/2011  . GERD (gastroesophageal reflux disease) 10/31/2011  . Paronychia of great toe of right foot 08/22/2011  . Pre-operative cardiovascular examination 03/21/2011  . ABDOMINAL PAIN 01/18/2011  . OBESITY-MORBID (>100') 06/09/2010  . Iron deficiency anemia 05/01/2010  . CHEST PAIN-PRECORDIAL 12/26/2009  . BASAL CELL CARCINOMA, HX OF 10/31/2009  . HYPERTROPHY PROSTATE W/UR OBST & OTH LUTS 07/27/2009  . FOOT PAIN 07/27/2009  . COUGH DUE TO ACE INHIBITORS 06/14/2009  . OTITIS MEDIA, ACUTE, LEFT 03/14/2009  . GI BLEEDING 02/16/2009  . HYPERTENSION, BENIGN 02/02/2009  . CAD, NATIVE  VESSEL 02/02/2009  . DIASTOLIC HEART FAILURE, CHRONIC 02/02/2009  . EDEMA 11/22/2008  . CONJUNCTIVITIS, RIGHT 06/18/2008  . HYPOKALEMIA 06/03/2008  . CHF 06/03/2008  . OSTEOPOROSIS 04/16/2008  . Essential hypertension 04/01/2008  . Gout 03/25/2008  . 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 PERIPHERAL VASCULAR DISEASE 09/01/2007  . Disorder resulting from impaired renal function 09/01/2007  . Controlled diabetes mellitus type 2 with complications ([place]) 10/62/6948  . Dyslipidemia 05/12/2007  . IMPOTENCE 05/12/2007  . 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 Allergic rhinitis 05/12/2007  . COPD (chronic obstructive pulmonary disease) with emphysema ([place]) 05/12/2007  . OSTEOARTHRITIS 05/12/2007  . SINUS HEADACHE 05/12/2007     Follow-Up:  Pharmacist Review   Recent Relevant Labs: Lab Results  Component Value Date/Time   HGBA1C 6.2 (A) 10/12/2020 01:46 PM   HGBA1C 6.3 (A) 08/12/2020 01:24 PM   HGBA1C 7.1 12/29/2019 12:00 AM   HGBA1C 6.9 (H) 05/11/2015 01:56 PM   HGBA1C 7.0 (H) 02/10/2015 09:33 AM   MICROALBUR 38.0 (H) 02/10/2015 09:33 AM   MICROALBUR 29.8 (H) 02/10/2014 03:00 PM    Kidney Function Lab Results  Component  Value Date/Time   CREATININE 1.44 03/02/2020 04:39 PM   CREATININE 1.6 (A) 12/29/2019 12:00 AM   CREATININE 1.66 (H) 08/29/2018 02:34 PM   CREATININE 1.35 05/10/2014 01:46 PM   GFR 47.47 (L) 03/02/2020 04:39 PM   GFRNONAA 51 (L) 08/20/2017 03:42 PM   GFRNONAA 52 (L) 05/10/2014 01:46 PM   GFRAA 59 (L) 08/20/2017 03:42 PM   GFRAA 60 05/10/2014 01:46 PM    . Current antihyperglycemic regimen:  o Farxiga 2.5 mg daily o Novolog Injection  4 units, 3 times a day (just before each meal) o Trulicity 3 mg weekly   . What recent interventions/DTPs have been made to improve glycemic control:  o None ID . Have there been any recent hospitalizations or ED visits since last visit with CPP? No . Patient denies hypoglycemic symptoms,  including Pale, Sweaty, Shaky, Hungry, Nervous/irritable and Vision changes . Patient denies hyperglycemic symptoms, including blurry vision, excessive thirst, fatigue, polyuria and weakness . How often are you checking your blood sugar? 3-4 times daily . What are your blood sugars ranging?  o Fasting: N/A o Before meals:  - On 11/23/2020 it was 172,174,162, and 170. - On 11/24/2020 it was 163,159,159,and 252. - On 11/25/2020 it was 170,158,183, and 147. - On 11/26/2020 it was 171,189,107, and 147. - On 11/27/2020 it was 157,237 (patient stated he forgot to take his insulin),130, and 134. - On 11/28/2020 it was 155,190,128, and 182. o After meals: N/A o Bedtime: N/A . During the week, how often does your blood glucose drop below 70? Never . Are you checking your feet daily/regularly?   Patient reports he has a chronic numbness sensation feeling in his feet. Adherence Review: Is the patient currently on a STATIN medication? Yes Is the patient currently on ACE/ARB medication? No Does the patient have >5 day gap between last estimated fill dates? Yes   [place] Pharmacist Assistant [phone]

## 2020-12-12 ENCOUNTER — Telehealth: Payer: Self-pay

## 2020-12-12 NOTE — Progress Notes (Signed)
Chronic Care Management Pharmacy Assistant   Name: [name].  MRN: [id number] DOB: 1941-01-06  Reason for Encounter: Medication Review/Patient assistance for Farxgia and Trulicity.  PCP : [name], DO  Allergies:   Allergies  Allergen Reactions  . Enalapril Maleate Cough    REACTION: cough  . Lisinopril Cough  . Shellfish-Derived Products Swelling    Said occurred twice; has eaten some since and had no reactions    Medications: Outpatient Encounter Medications as of 12/12/2020  Medication Sig  . albuterol (VENTOLIN HFA) 108 (90 Base) MCG/ACT inhaler Inhale 2 puffs into the lungs every 6 (six) hours as needed for wheezing or shortness of breath.  . allopurinol (ZYLOPRIM) 300 MG tablet TAKE 1 TABLET(300 MG) BY MOUTH DAILY  . AMBULATORY NON FORMULARY MEDICATION O2 @ 2LMP @ night  . amoxicillin-clavulanate (AUGMENTIN) 875-125 MG tablet Take 1 tablet by mouth 2 (two) times daily.  Marland Kitchen aspirin 81 MG tablet Take 81 mg by mouth daily.    . Azelastine HCl 137 MCG/SPRAY SOLN INSTILL 2 SPRAYS INTO BOTH NOSTRILS TWICE DAILY AS NEEDED FOR RHINITIS.  Marland Kitchen B Complex Vitamins (VITAMIN B COMPLEX PO) Take 1 tablet by mouth daily.  . budesonide-formoterol (SYMBICORT) 160-4.5 MCG/ACT inhaler Inhale 2 puffs into the lungs 2 (two) times daily.  . calcitRIOL (ROCALTROL) 0.25 MCG capsule Take 1 capsule (0.25 mcg total) by mouth daily.  . carvedilol (COREG) 25 MG tablet Take 1 tablet (25 mg total) by mouth 2 (two) times daily with a meal. Pt needs to keep upcoming appt in Feb, 2022 for further refills  . cetirizine (ZYRTEC) 10 MG tablet Take 10 mg by mouth as needed for allergies.   . cholecalciferol (VITAMIN D3) 25 MCG (1000 UNIT) tablet Take 1,000 Units by mouth daily.  . citalopram (CELEXA) 10 MG tablet TAKE 1 TABLET(10 MG) BY MOUTH DAILY  . clopidogrel (PLAVIX) 75 MG tablet Take 1 tablet (75 mg total) by mouth daily. Pt needs to keep upcoming appt in Feb, 2022 for further refills  .  dapagliflozin propanediol (FARXIGA) 5 MG TABS tablet Take 2.5 mg by mouth daily.  . diphenhydrAMINE (BENADRYL) 25 MG tablet Take 25 mg by mouth every 6 (six) hours as needed.  . Dulaglutide (TRULICITY) 3 EY/8.1KG SOPN Inject 3 mg as directed once a week.  . finasteride (PROSCAR) 5 MG tablet Take 1 tablet (5 mg total) by mouth daily.  . furosemide (LASIX) 40 MG tablet Take 3 tablets by mouth every morning and 2 tablets every afternoon.  . GuaiFENesin (MUCINEX MAXIMUM STRENGTH PO) Take 1 Dose by mouth as needed (drainage).  . insulin aspart (NOVOLOG) 100 UNIT/ML injection Inject 4 Units into the skin 3 (three) times daily with meals.  . isosorbide mononitrate (IMDUR) 60 MG 24 hr tablet Take 1 tablet (60 mg total) by mouth daily.  Marland Kitchen ketoconazole (NIZORAL) 2 % cream Apply 1 application topically daily as needed for irritation.   . Multiple Vitamins-Minerals (MULTIVITAMIN PO) Take 1 tablet by mouth daily.    . nitroGLYCERIN (NITROLINGUAL) 0.4 MG/SPRAY spray Place 1 spray under the tongue as directed.  . pantoprazole (PROTONIX) 40 MG tablet Take 1 tablet (40 mg total) by mouth daily.  . polyethylene glycol powder (MIRALAX) powder Take 17 g by mouth daily as needed (constipation).   . potassium chloride (KLOR-CON) 10 MEQ tablet Take 1 tablet (10 mEq total) by mouth daily.  . Protein (UNJURY UNFLAVORED PO) Take 8-16 oz by mouth daily.   . rosuvastatin (CRESTOR)  10 MG tablet Take 1 tablet (10 mg total) by mouth daily.  . Tiotropium Bromide Monohydrate (SPIRIVA RESPIMAT) 2.5 MCG/ACT AERS Inhale 2 puffs into the lungs daily.  . traZODone (DESYREL) 100 MG tablet TAKE 1 TABLET(100 MG) BY MOUTH AT BEDTIME  . triamcinolone (KENALOG) 0.025 % cream Apply 1 application topically 3 (three) times daily as needed (skin).   No facility-administered encounter medications on file as of 12/12/2020.    Current Diagnosis: Patient Active Problem List   Diagnosis Date Noted  . Acute bronchitis with COPD ([place])  03/26/2018  . Renal failure 08/30/2017  . Abnormal x-ray of lung 08/14/2017  . Hypocalcemia 04/24/2017  . Anemia 04/24/2017  . Low back pain 12/28/2016  . Hemoptysis 10/18/2016  . Chronic respiratory failure with hypoxia and hypercapnia ([place]) 09/04/2016  . Fatigue 06/13/2016  . Diabetes ([place]) 02/23/2016  . Jaw pain 10/25/2015  . Fever 08/08/2015  . Pneumonia 09/10/2014  . CHF exacerbation ([place]) 08/30/2014  . COPD (chronic obstructive pulmonary disease) with chronic bronchitis ([place]) 08/30/2014  . Obesity (BMI 30-39.9) 08/30/2014  . Right lower lobe pneumonia 08/30/2014  . Special screening for malignant neoplasms, colon 07/27/2014  . Family history of colon cancer in mother 07/27/2014  . Acute combined systolic and diastolic heart failure ([place]) 02/10/2014  . Yeast infection 08/12/2013  . Difficulty swallowing solids 07/21/2013  . NSTEMI (non-ST elevated myocardial infarction) ([place]) 07/17/2013  . Sepsis ([place]) 07/16/2013  . Elevated troponin 07/16/2013  . UTI (urinary tract infection) 02/23/2013  . 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  . Encounter for long-term (current) use of other medications 11/24/2012  . Screening for malignant neoplasm of prostate 11/24/2012  . DM (diabetes mellitus) ([place]) 11/24/2012  . History of laparoscopic adjustable gastric banding 03/20/2012  . Depression 12/14/2011  . Physical deconditioning 12/14/2011  . GERD (gastroesophageal reflux disease) 10/31/2011  . Paronychia of great toe of right foot 08/22/2011  . Pre-operative cardiovascular examination 03/21/2011  . ABDOMINAL PAIN 01/18/2011  . OBESITY-MORBID (>100') 06/09/2010  . Iron deficiency anemia 05/01/2010  . CHEST PAIN-PRECORDIAL 12/26/2009  . BASAL CELL CARCINOMA, HX OF 10/31/2009  . HYPERTROPHY PROSTATE W/UR OBST & OTH LUTS 07/27/2009  . FOOT PAIN 07/27/2009  . COUGH DUE TO ACE INHIBITORS 06/14/2009  . OTITIS MEDIA, ACUTE, LEFT 03/14/2009  . GI BLEEDING 02/16/2009  .  HYPERTENSION, BENIGN 02/02/2009  . CAD, NATIVE VESSEL 02/02/2009  . DIASTOLIC HEART FAILURE, CHRONIC 02/02/2009  . EDEMA 11/22/2008  . CONJUNCTIVITIS, RIGHT 06/18/2008  . HYPOKALEMIA 06/03/2008  . CHF 06/03/2008  . OSTEOPOROSIS 04/16/2008  . Essential hypertension 04/01/2008  . Gout 03/25/2008  . 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 PERIPHERAL VASCULAR DISEASE 09/01/2007  . Disorder resulting from impaired renal function 09/01/2007  . Controlled diabetes mellitus type 2 with complications ([place]) 85/27/7824  . Dyslipidemia 05/12/2007  . IMPOTENCE 05/12/2007  . 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 Allergic rhinitis 05/12/2007  . COPD (chronic obstructive pulmonary disease) with emphysema ([place]) 05/12/2007  . OSTEOARTHRITIS 05/12/2007  . SINUS HEADACHE 05/12/2007    Goals Addressed   None     Follow-Up:  Patient Assistance Coordination   Per note on 10/17/2020 Patient was approved from [employer] for a 12 month period.Spoke with [facility] patient needs to enroll in [id number]/5361 for Trulicity.   Spoke with patient regarding [facility] patient assistance, patient states he was approve in November 2021 and has 3 refills as of 12/12/2020.   [facility] Pharmacist Assistant [phone]

## 2020-12-28 ENCOUNTER — Telehealth: Payer: Self-pay

## 2020-12-28 NOTE — Progress Notes (Addendum)
Chronic Care Management Pharmacy Assistant   Name: Aloysius Wolber.  MRN: [id number] DOB: 10/14/1941  Reason for Encounter:Hypertension  Disease State Call.  PCP : Overton Mam, DO  Allergies:   Allergies  Allergen Reactions   Enalapril Maleate Cough    REACTION: cough   Lisinopril Cough   Shellfish-Derived Products Swelling    Said occurred twice; has eaten some since and had no reactions    Medications: Outpatient Encounter Medications as of 12/28/2020  Medication Sig   albuterol (VENTOLIN HFA) 108 (90 Base) MCG/ACT inhaler Inhale 2 puffs into the lungs every 6 (six) hours as needed for wheezing or shortness of breath.   allopurinol (ZYLOPRIM) 300 MG tablet TAKE 1 TABLET(300 MG) BY MOUTH DAILY   AMBULATORY NON FORMULARY MEDICATION O2 @ 2LMP @ night   amoxicillin-clavulanate (AUGMENTIN) 875-125 MG tablet Take 1 tablet by mouth 2 (two) times daily.   aspirin 81 MG tablet Take 81 mg by mouth daily.     Azelastine HCl 137 MCG/SPRAY SOLN INSTILL 2 SPRAYS INTO BOTH NOSTRILS TWICE DAILY AS NEEDED FOR RHINITIS.   B Complex Vitamins (VITAMIN B COMPLEX PO) Take 1 tablet by mouth daily.   budesonide-formoterol (SYMBICORT) 160-4.5 MCG/ACT inhaler Inhale 2 puffs into the lungs 2 (two) times daily.   calcitRIOL (ROCALTROL) 0.25 MCG capsule Take 1 capsule (0.25 mcg total) by mouth daily.   carvedilol (COREG) 25 MG tablet Take 1 tablet (25 mg total) by mouth 2 (two) times daily with a meal. Pt needs to keep upcoming appt in Feb, 2022 for further refills   cetirizine (ZYRTEC) 10 MG tablet Take 10 mg by mouth as needed for allergies.    cholecalciferol (VITAMIN D3) 25 MCG (1000 UNIT) tablet Take 1,000 Units by mouth daily.   citalopram (CELEXA) 10 MG tablet TAKE 1 TABLET(10 MG) BY MOUTH DAILY   clopidogrel (PLAVIX) 75 MG tablet Take 1 tablet (75 mg total) by mouth daily. Pt needs to keep upcoming appt in Feb, 2022 for further refills   dapagliflozin propanediol (FARXIGA) 5 MG TABS tablet  Take 2.5 mg by mouth daily.   diphenhydrAMINE (BENADRYL) 25 MG tablet Take 25 mg by mouth every 6 (six) hours as needed.   Dulaglutide (TRULICITY) 3 MG/0.5ML SOPN Inject 3 mg as directed once a week.   finasteride (PROSCAR) 5 MG tablet Take 1 tablet (5 mg total) by mouth daily.   furosemide (LASIX) 40 MG tablet Take 3 tablets by mouth every morning and 2 tablets every afternoon.   GuaiFENesin (MUCINEX MAXIMUM STRENGTH PO) Take 1 Dose by mouth as needed (drainage).   insulin aspart (NOVOLOG) 100 UNIT/ML injection Inject 4 Units into the skin 3 (three) times daily with meals.   isosorbide mononitrate (IMDUR) 60 MG 24 hr tablet Take 1 tablet (60 mg total) by mouth daily.   ketoconazole (NIZORAL) 2 % cream Apply 1 application topically daily as needed for irritation.    Multiple Vitamins-Minerals (MULTIVITAMIN PO) Take 1 tablet by mouth daily.     nitroGLYCERIN (NITROLINGUAL) 0.4 MG/SPRAY spray Place 1 spray under the tongue as directed.   pantoprazole (PROTONIX) 40 MG tablet Take 1 tablet (40 mg total) by mouth daily.   polyethylene glycol powder (MIRALAX) powder Take 17 g by mouth daily as needed (constipation).    potassium chloride (KLOR-CON) 10 MEQ tablet Take 1 tablet (10 mEq total) by mouth daily.   Protein (UNJURY UNFLAVORED PO) Take 8-16 oz by mouth daily.    rosuvastatin (CRESTOR) 10 MG tablet  Take 1 tablet (10 mg total) by mouth daily.   Tiotropium Bromide Monohydrate (SPIRIVA RESPIMAT) 2.5 MCG/ACT AERS Inhale 2 puffs into the lungs daily.   traZODone (DESYREL) 100 MG tablet TAKE 1 TABLET(100 MG) BY MOUTH AT BEDTIME   triamcinolone (KENALOG) 0.025 % cream Apply 1 application topically 3 (three) times daily as needed (skin).   No facility-administered encounter medications on file as of 12/28/2020.    Current Diagnosis: Patient Active Problem List   Diagnosis Date Noted   Acute bronchitis with COPD (HCC) 03/26/2018   Renal failure 08/30/2017   Abnormal x-ray of lung 08/14/2017    Hypocalcemia 04/24/2017   Anemia 04/24/2017   Low back pain 12/28/2016   Hemoptysis 10/18/2016   Chronic respiratory failure with hypoxia and hypercapnia (HCC) 09/04/2016   Fatigue 06/13/2016   Diabetes (HCC) 02/23/2016   Jaw pain 10/25/2015   Fever 08/08/2015   Pneumonia 09/10/2014   CHF exacerbation (HCC) 08/30/2014   COPD (chronic obstructive pulmonary disease) with chronic bronchitis (HCC) 08/30/2014   Obesity (BMI 30-39.9) 08/30/2014   Right lower lobe pneumonia 08/30/2014   Special screening for malignant neoplasms, colon 07/27/2014   Family history of colon cancer in mother 07/27/2014   Acute combined systolic and diastolic heart failure (HCC) 02/10/2014   Yeast infection 08/12/2013   Difficulty swallowing solids 07/21/2013   NSTEMI (non-ST elevated myocardial infarction) (HCC) 07/17/2013   Sepsis (HCC) 07/16/2013   Elevated troponin 07/16/2013   UTI (urinary tract infection) 02/23/2013   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   Encounter for long-term (current) use of other medications 11/24/2012   Screening for malignant neoplasm of prostate 11/24/2012   DM (diabetes mellitus) (HCC) 11/24/2012   History of laparoscopic adjustable gastric banding 03/20/2012   Depression 12/14/2011   Physical deconditioning 12/14/2011   GERD (gastroesophageal reflux disease) 10/31/2011   Paronychia of great toe of right foot 08/22/2011   Pre-operative cardiovascular examination 03/21/2011   ABDOMINAL PAIN 01/18/2011   OBESITY-MORBID (>100') 06/09/2010   Iron deficiency anemia 05/01/2010   CHEST PAIN-PRECORDIAL 12/26/2009   BASAL CELL CARCINOMA, HX OF 10/31/2009   HYPERTROPHY PROSTATE W/UR OBST & OTH LUTS 07/27/2009   FOOT PAIN 07/27/2009   COUGH DUE TO ACE INHIBITORS 06/14/2009   OTITIS MEDIA, ACUTE, LEFT 03/14/2009   GI BLEEDING 02/16/2009   HYPERTENSION, BENIGN 02/02/2009   CAD, NATIVE VESSEL 02/02/2009   DIASTOLIC HEART FAILURE, CHRONIC 02/02/2009   EDEMA 11/22/2008    CONJUNCTIVITIS, RIGHT 06/18/2008   HYPOKALEMIA 06/03/2008   CHF 06/03/2008   OSTEOPOROSIS 04/16/2008   Essential hypertension 04/01/2008   Gout 03/25/2008   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 Spoke with patient regarding BP  Recent Office Vitals: BP Readings from Last 3 Encounters:  10/12/20 112/60  08/12/20 110/68  06/13/20 100/60   Pulse Readings from Last 3 Encounters:  10/12/20 86  08/12/20 75  06/13/20 84    Wt Readings from Last 3 Encounters:  10/12/20 212 lb (96.2 kg)  08/12/20 218 lb 3.2 oz (99  kg)  06/13/20 218 lb 3.2 oz (99 kg)     Kidney Function Lab Results  Component Value Date/Time   CREATININE 1.44 03/02/2020 04:39 PM   CREATININE 1.6 (A) 12/29/2019 12:00 AM   CREATININE 1.66 (H) 08/29/2018 02:34 PM   CREATININE 1.35 05/10/2014 01:46 PM   GFR 47.47 (L) 03/02/2020 04:39 PM   GFRNONAA 51 (L) 08/20/2017 03:42 PM   GFRNONAA 52 (L) 05/10/2014 01:46 PM   GFRAA 59 (L) 08/20/2017 03:42 PM   GFRAA 60 05/10/2014 01:46 PM    BMP Latest Ref Rng & Units 03/02/2020 12/29/2019 08/29/2018  Glucose 70 - 99 mg/dL 207(H) - -  BUN 6 - 23 mg/dL 34(H) - -  Creatinine 0.40 - 1.50 mg/dL 1.44 1.6(A) -  BUN/Creat Ratio 10 - 24 - - -  Sodium 135 - 145 mEq/L 135 137 -  Potassium 3.5 - 5.1 mEq/L 4.2 4.0 -  Chloride 96 - 112 mEq/L 96 97(A) -  CO2 19 - 32 mEq/L 33(H) 36(A) -  Calcium 8.4 - 10.5 mg/dL 9.3 9.3 9.4    Current antihypertensive regimen:     Carvedilol 25 mg BID              Furosemide 120 mg AM, 80 mg PM              Imdur 60 mg daily  How often are you checking your Blood Pressure? infrequently Current home BP readings:  Patient states his blood pressure has been ranging around 130/75. What recent interventions/DTPs have been made by any provider to improve Blood Pressure control since last CPP Visit: None ID Any recent hospitalizations or ED visits since last visit with CPP? No What diet changes have been made to improve Blood Pressure Control?  Patient states he mainly does home cook food. Patient reports he uses a pressure cooker and haven't had salt in years. Patient states he should eat more vegetables, he is working on it.   What exercise is being done to improve your Blood Pressure Control?  Patient stated he walks but not on a daily basics especially when it is hot outside with having COPD.    Adherence Review: Is the patient currently on ACE/ARB medication? No Does the patient have >5 day gap between last estimated fill dates? Yes  Maryjean Ka  Follow-Up:  Pharmacist Review   Anderson Malta Clinical Pharmacist Assistant [phone]   8 minutes spent in review, coordination, and documentation. Doristine Section Clinical Pharmacist [facility]  [phone]

## 2021-01-01 ENCOUNTER — Other Ambulatory Visit: Payer: Self-pay | Admitting: Cardiovascular Disease

## 2021-01-13 ENCOUNTER — Ambulatory Visit: Payer: Medicare Other | Admitting: Endocrinology

## 2021-01-20 ENCOUNTER — Ambulatory Visit: Payer: Medicare Other | Admitting: Emergency Medicine

## 2021-01-20 ENCOUNTER — Ambulatory Visit (INDEPENDENT_AMBULATORY_CARE_PROVIDER_SITE_OTHER): Payer: Medicare Other | Admitting: Adult Health

## 2021-01-20 ENCOUNTER — Ambulatory Visit (INDEPENDENT_AMBULATORY_CARE_PROVIDER_SITE_OTHER): Payer: Medicare Other

## 2021-01-20 ENCOUNTER — Other Ambulatory Visit: Payer: Self-pay

## 2021-01-20 ENCOUNTER — Encounter: Payer: Self-pay | Admitting: Adult Health

## 2021-01-20 VITALS — BP 130/60 | HR 94 | Temp 97.5°F | Ht 63.0 in | Wt 209.8 lb

## 2021-01-20 DIAGNOSIS — J439 Emphysema, unspecified: Secondary | ICD-10-CM

## 2021-01-20 DIAGNOSIS — G4734 Idiopathic sleep related nonobstructive alveolar hypoventilation: Secondary | ICD-10-CM | POA: Diagnosis not present

## 2021-01-20 DIAGNOSIS — J9612 Chronic respiratory failure with hypercapnia: Secondary | ICD-10-CM

## 2021-01-20 DIAGNOSIS — J9611 Chronic respiratory failure with hypoxia: Secondary | ICD-10-CM | POA: Diagnosis not present

## 2021-01-20 DIAGNOSIS — I5032 Chronic diastolic (congestive) heart failure: Secondary | ICD-10-CM

## 2021-01-20 DIAGNOSIS — J449 Chronic obstructive pulmonary disease, unspecified: Secondary | ICD-10-CM | POA: Diagnosis not present
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
## 2021-01-20 NOTE — Assessment & Plan Note (Signed)
Appears compensated-euvolemic on exam.  Continue current regimen.  Low-salt diet.  Plan  Patient Instructions  Continue Symbicort and Spiriva . Rinse after use.  Chest xray today  Continue on Lasix .  Low salt diet .  Legs elevated.  Activity as tolerated.  Continue on Oxygen 2l/m At bedtime  .  Set up for ONO on room air for insurance requirements.  Please contact office for sooner follow up if symptoms do not improve or worsen or seek emergency care  Follow up Dr. Lamonte Sakai in 6 months and As needed

## 2021-01-20 NOTE — Patient Instructions (Addendum)
Continue Symbicort and Spiriva . Rinse after use.  Chest xray today  Continue on Lasix .  Low salt diet .  Legs elevated.  Activity as tolerated.  Continue on Oxygen 2l/m At bedtime  .  Set up for ONO on room air for insurance requirements.  Please contact office for sooner follow up if symptoms do not improve or worsen or seek emergency care  Follow up Dr. Lamonte Sakai in 6 months and As needed

## 2021-01-20 NOTE — Assessment & Plan Note (Signed)
Currently stable on present regimen  Plan  Patient Instructions  Continue Symbicort and Spiriva . Rinse after use.  Chest xray today  Continue on Lasix .  Low salt diet .  Legs elevated.  Activity as tolerated.  Continue on Oxygen 2l/m At bedtime  .  Set up for ONO on room air for insurance requirements.  Please contact office for sooner follow up if symptoms do not improve or worsen or seek emergency care  Follow up Dr. Lamonte Sakai in 6 months and As needed

## 2021-01-20 NOTE — Assessment & Plan Note (Signed)
Continue on oxygen 2 L at bedtime.  Overnight oximetry test on room air for oxygen qualification for insurance  Plan  Patient Instructions  Continue Symbicort and Spiriva . Rinse after use.  Chest xray today  Continue on Lasix .  Low salt diet .  Legs elevated.  Activity as tolerated.  Continue on Oxygen 2l/m At bedtime  .  Set up for ONO on room air for insurance requirements.  Please contact office for sooner follow up if symptoms do not improve or worsen or seek emergency care  Follow up Dr. Lamonte Sakai in 6 months and As needed

## 2021-01-24 ENCOUNTER — Ambulatory Visit (INDEPENDENT_AMBULATORY_CARE_PROVIDER_SITE_OTHER): Payer: Medicare Other | Admitting: Endocrinology

## 2021-01-24 ENCOUNTER — Telehealth: Payer: Self-pay | Admitting: *Deleted

## 2021-01-24 ENCOUNTER — Other Ambulatory Visit: Payer: Self-pay

## 2021-01-24 VITALS — BP 120/64 | HR 88 | Ht 63.0 in | Wt 208.8 lb

## 2021-01-24 DIAGNOSIS — Z794 Long term (current) use of insulin: Secondary | ICD-10-CM

## 2021-01-24 DIAGNOSIS — I255 Ischemic cardiomyopathy: Secondary | ICD-10-CM | POA: Diagnosis not present

## 2021-01-24 DIAGNOSIS — E1151 Type 2 diabetes mellitus with diabetic peripheral angiopathy without gangrene: Secondary | ICD-10-CM

## 2021-01-24 LAB — POCT GLYCOSYLATED HEMOGLOBIN (HGB A1C): Hemoglobin A1C: 6.3 % — AB (ref 4.0–5.6)

## 2021-01-24 MED ORDER — TRULICITY 4.5 MG/0.5ML ~~LOC~~ SOAJ: 4.5000 mg | SUBCUTANEOUS | 3 refills | Status: DC

## 2021-01-24 MED ORDER — INSULIN ASPART 100 UNIT/ML ~~LOC~~ SOLN: 5.0000 [IU] | Freq: Three times a day (TID) | SUBCUTANEOUS | 11 refills | Status: DC
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
## 2021-01-24 NOTE — Telephone Encounter (Signed)
      Notified  [facility] pt assistant program([phone]) --verbal order to increase Trulicity to 4.5 mg weekly by Dr. Loanne Drilling.  Notified pt --increase of medication.

## 2021-01-24 NOTE — Patient Instructions (Addendum)
please increase the Trulicity again (we will do the form), and:  Please reduce the novolog to 5 units, 3 times a day (just before each meal), and:  Please continue the same [place].   check your blood sugar twice a day.  vary the time of day when you check, between before the 3 meals, and at bedtime.  also check if you have symptoms of your blood sugar being too high or too low.  please keep a record of the readings and bring it to your next appointment here (or you can bring the meter itself).  You can write it on any piece of paper.  please call us sooner if your blood sugar goes below 70, or if you have a lot of readings over 200.  Please come back for a follow-up appointment in 3 months.

## 2021-01-26 ENCOUNTER — Ambulatory Visit (INDEPENDENT_AMBULATORY_CARE_PROVIDER_SITE_OTHER): Payer: Medicare Other | Admitting: Cardiovascular Disease

## 2021-01-26 ENCOUNTER — Other Ambulatory Visit: Payer: Self-pay

## 2021-01-26 ENCOUNTER — Encounter: Payer: Self-pay | Admitting: Cardiovascular Disease

## 2021-01-26 VITALS — BP 124/56 | HR 81 | Ht 63.0 in | Wt 208.8 lb

## 2021-01-26 DIAGNOSIS — I10 Essential (primary) hypertension: Secondary | ICD-10-CM | POA: Diagnosis not present

## 2021-01-26 DIAGNOSIS — E7800 Pure hypercholesterolemia, unspecified: Secondary | ICD-10-CM

## 2021-01-26 DIAGNOSIS — I2510 Atherosclerotic heart disease of native coronary artery without angina pectoris: Secondary | ICD-10-CM

## 2021-01-26 DIAGNOSIS — I255 Ischemic cardiomyopathy: Secondary | ICD-10-CM

## 2021-01-26 DIAGNOSIS — I5042 Chronic combined systolic (congestive) and diastolic (congestive) heart failure: Secondary | ICD-10-CM

## 2021-01-26 NOTE — Patient Instructions (Signed)
Medication Instructions:  Your physician recommends that you continue on your current medications as directed. Please refer to the Current Medication list given to you today.  *If you need a refill on your cardiac medications before your next appointment, please call your pharmacy*   Lab Work: None today If you have labs (blood work) drawn today and your tests are completely normal, you will receive your results only by: Marland Kitchen MyChart Message (if you have MyChart) OR . A paper copy in the mail If you have any lab test that is abnormal or we need to change your treatment, we will call you to review the results.   Testing/Procedures: None ordered   Follow-Up: At [facility], you and your health needs are our priority.  As part of our continuing mission to provide you with exceptional heart care, we have created designated Provider Care Teams.  These Care Teams include your primary Cardiologist (physician) and Advanced Practice Providers (APPs -  Physician Assistants and Nurse Practitioners) who all work together to provide you with the care you need, when you need it.   Your next appointment:   1 year(s)  The format for your next appointment:   In Person  Provider:   Lauree Chandler, MD
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
## 2021-01-27 ENCOUNTER — Telehealth: Payer: Self-pay

## 2021-01-27 NOTE — Progress Notes (Signed)
Chronic Care Management Pharmacy Assistant   Name: [name].  MRN: [id number] DOB: [birth year]  Reason for Encounter:Diabetes Disease State Call.  Patient Questions:  1.  Have you seen any other providers since your last visit? Yes, 01/20/2021 Pulmonary Tammy Parrett ,01/24/2021 Endocrinology [name], 01/26/2021 Cardiology Lauree Chandler   2.  Any changes in your medicines or health? Yes, 01/24/2021 Endocrinology [name] increase Trulicity, reduce Novolog to 5 units 3 times a day,    PCP : [name], DO  Allergies:   Allergies  Allergen Reactions  . Enalapril Maleate Cough    REACTION: cough  . Lisinopril Cough  . Shellfish-Derived Products Swelling    Said occurred twice; has eaten some since and had no reactions    Medications: Outpatient Encounter Medications as of 01/27/2021  Medication Sig  . albuterol (VENTOLIN HFA) 108 (90 Base) MCG/ACT inhaler Inhale 2 puffs into the lungs every 6 (six) hours as needed for wheezing or shortness of breath.  . allopurinol (ZYLOPRIM) 300 MG tablet TAKE 1 TABLET(300 MG) BY MOUTH DAILY  . AMBULATORY NON FORMULARY MEDICATION O2 @ 2LMP @ night  . aspirin 81 MG tablet Take 81 mg by mouth daily.  . Azelastine HCl 137 MCG/SPRAY SOLN INSTILL 2 SPRAYS INTO BOTH NOSTRILS TWICE DAILY AS NEEDED FOR RHINITIS.  Marland Kitchen B Complex Vitamins (VITAMIN B COMPLEX PO) Take 1 tablet by mouth daily.  . budesonide-formoterol (SYMBICORT) 160-4.5 MCG/ACT inhaler Inhale 2 puffs into the lungs 2 (two) times daily.  . calcitRIOL (ROCALTROL) 0.25 MCG capsule Take 1 capsule (0.25 mcg total) by mouth daily.  . carvedilol (COREG) 25 MG tablet Take 1 tablet (25 mg total) by mouth 2 (two) times daily with a meal.  . cetirizine (ZYRTEC) 10 MG tablet Take 10 mg by mouth as needed for allergies.   . cholecalciferol (VITAMIN D3) 25 MCG (1000 UNIT) tablet Take 1,000 Units by mouth daily.  . citalopram (CELEXA) 10 MG tablet TAKE 1 TABLET(10 MG) BY MOUTH  DAILY  . clopidogrel (PLAVIX) 75 MG tablet Take 1 tablet (75 mg total) by mouth daily.  . dapagliflozin propanediol (FARXIGA) 5 MG TABS tablet Take 2.5 mg by mouth daily.  . diphenhydrAMINE (BENADRYL) 25 MG tablet Take 25 mg by mouth every 6 (six) hours as needed.  . Dulaglutide (TRULICITY) 4.5 CZ/6.6AY SOPN Inject 4.5 mg as directed once a week.  . finasteride (PROSCAR) 5 MG tablet Take 1 tablet (5 mg total) by mouth daily.  . furosemide (LASIX) 40 MG tablet Take 3 tablets by mouth every morning and 2 tablets every afternoon.  . GuaiFENesin (MUCINEX MAXIMUM STRENGTH PO) Take 1 Dose by mouth as needed (drainage).  . insulin aspart (NOVOLOG) 100 UNIT/ML injection Inject 5 Units into the skin 3 (three) times daily with meals.  . isosorbide mononitrate (IMDUR) 60 MG 24 hr tablet Take 1 tablet (60 mg total) by mouth daily.  Marland Kitchen ketoconazole (NIZORAL) 2 % cream Apply 1 application topically daily as needed for irritation.   . Multiple Vitamins-Minerals (MULTIVITAMIN PO) Take 1 tablet by mouth daily.  . nitroGLYCERIN (NITROLINGUAL) 0.4 MG/SPRAY spray Place 1 spray under the tongue as directed.  . pantoprazole (PROTONIX) 40 MG tablet Take 1 tablet (40 mg total) by mouth daily.  . polyethylene glycol powder (GLYCOLAX/MIRALAX) 17 GM/SCOOP powder Take 17 g by mouth daily as needed (constipation).  . potassium chloride (KLOR-CON) 10 MEQ tablet Take 1 tablet (10 mEq total) by mouth daily.  . Protein (UNJURY UNFLAVORED PO)  Take 8-16 oz by mouth daily.  . rosuvastatin (CRESTOR) 10 MG tablet Take 1 tablet (10 mg total) by mouth daily.  . Tiotropium Bromide Monohydrate (SPIRIVA RESPIMAT) 2.5 MCG/ACT AERS Inhale 2 puffs into the lungs daily.  . traZODone (DESYREL) 100 MG tablet TAKE 1 TABLET(100 MG) BY MOUTH AT BEDTIME  . triamcinolone (KENALOG) 0.025 % cream Apply 1 application topically 3 (three) times daily as needed (skin).   No facility-administered encounter medications on file as of 01/27/2021.    Current  Diagnosis: Patient Active Problem List   Diagnosis Date Noted  . Acute bronchitis with COPD ([place]) 03/26/2018  . Renal failure 08/30/2017  . Abnormal x-ray of lung 08/14/2017  . Hypocalcemia 04/24/2017  . Anemia 04/24/2017  . Low back pain 12/28/2016  . Hemoptysis 10/18/2016  . Chronic respiratory failure with hypoxia and hypercapnia ([place]) 09/04/2016  . Fatigue 06/13/2016  . Diabetes ([place]) 02/23/2016  . Jaw pain 10/25/2015  . Fever 08/08/2015  . Pneumonia 09/10/2014  . CHF exacerbation ([place]) 08/30/2014  . COPD (chronic obstructive pulmonary disease) with chronic bronchitis ([place]) 08/30/2014  . Obesity (BMI 30-39.9) 08/30/2014  . Right lower lobe pneumonia 08/30/2014  . Special screening for malignant neoplasms, colon 07/27/2014  . Family history of colon cancer in mother 07/27/2014  . Acute combined systolic and diastolic heart failure ([place]) 02/10/2014  . Yeast infection 08/12/2013  . Difficulty swallowing solids 07/21/2013  . NSTEMI (non-ST elevated myocardial infarction) ([place]) 07/17/2013  . Sepsis ([place]) 07/16/2013  . Elevated troponin 07/16/2013  . UTI (urinary tract infection) 02/23/2013  . 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  . Encounter for long-term (current) use of other medications 11/24/2012  . Screening for malignant neoplasm of prostate 11/24/2012  . DM (diabetes mellitus) ([place]) 11/24/2012  . History of laparoscopic adjustable gastric banding 03/20/2012  . Depression 12/14/2011  . Physical deconditioning 12/14/2011  . GERD (gastroesophageal reflux disease) 10/31/2011  . Paronychia of great toe of right foot 08/22/2011  . Pre-operative cardiovascular examination 03/21/2011  . ABDOMINAL PAIN 01/18/2011  . OBESITY-MORBID (>100') 06/09/2010  . Iron deficiency anemia 05/01/2010  . CHEST PAIN-PRECORDIAL 12/26/2009  . BASAL CELL CARCINOMA, HX OF 10/31/2009  . HYPERTROPHY PROSTATE W/UR OBST & OTH LUTS 07/27/2009  . FOOT PAIN 07/27/2009  . COUGH DUE TO ACE  INHIBITORS 06/14/2009  . OTITIS MEDIA, ACUTE, LEFT 03/14/2009  . GI BLEEDING 02/16/2009  . HYPERTENSION, BENIGN 02/02/2009  . CAD, NATIVE VESSEL 02/02/2009  . DIASTOLIC HEART FAILURE, CHRONIC 02/02/2009  . EDEMA 11/22/2008  . CONJUNCTIVITIS, RIGHT 06/18/2008  . HYPOKALEMIA 06/03/2008  . CHF 06/03/2008  . OSTEOPOROSIS 04/16/2008  . Essential hypertension 04/01/2008  . Gout 03/25/2008  . Secondary renal hyperparathyroidism ([place]) 03/25/2008  . PERIPHERAL VASCULAR DISEASE 09/01/2007  . Disorder resulting from impaired renal function 09/01/2007  . Controlled diabetes mellitus type 2 with complications ([place]) 99991111  . Dyslipidemia 05/12/2007  . IMPOTENCE 05/12/2007  . 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 Allergic rhinitis 05/12/2007  . COPD (chronic obstructive pulmonary disease) with emphysema ([place]) 05/12/2007  . OSTEOARTHRITIS 05/12/2007  . SINUS HEADACHE 05/12/2007    Goals Addressed   None    Recent Relevant Labs: Lab Results  Component Value Date/Time   HGBA1C 6.3 (A) 01/24/2021 01:55 PM   HGBA1C 6.2 (A) 10/12/2020 01:46 PM   HGBA1C 7.1 12/29/2019 12:00 AM   HGBA1C 6.9 (H) 05/11/2015 01:56 PM   HGBA1C 7.0 (H) 02/10/2015 09:33 AM   MICROALBUR 38.0 (H) 02/10/2015 09:33 AM  MICROALBUR 29.8 (H) 02/10/2014 03:00 PM    Kidney Function Lab Results  Component Value Date/Time   CREATININE 1.44 03/02/2020 04:39 PM   CREATININE 1.6 (A) 12/29/2019 12:00 AM   CREATININE 1.66 (H) 08/29/2018 02:34 PM   CREATININE 1.35 05/10/2014 01:46 PM   GFR 47.47 (L) 03/02/2020 04:39 PM   GFRNONAA 51 (L) 08/20/2017 03:42 PM   GFRNONAA 52 (L) 05/10/2014 01:46 PM   GFRAA 59 (L) 08/20/2017 03:42 PM   GFRAA 60 05/10/2014 01:46 PM    . Current antihyperglycemic regimen:  o Trulicity 4.5 EP/3.2 mL , inject 4.5 mg weekly o Novolog 100 unit/ML , inject 5 units 3 times daily o  . What recent interventions/DTPs have been made to improve glycemic control:   Yes, 01/24/2021 Endocrinology  [name] increase Trulicity, reduce Novolog to 5 units 3 times a day,  . Have there been any recent hospitalizations or ED visits since last visit with CPP? No . Patient denies hypoglycemic symptoms, including Pale, Sweaty, Shaky, Hungry, Nervous/irritable and Vision changes . Patient denies hyperglycemic symptoms, including blurry vision, excessive thirst, fatigue, polyuria and weakness . How often are you checking your blood sugar? 3-4 times daily . What are your blood sugars ranging?  o Fasting: N/A o Before meals:  - On 02/07/2021 it was [phone]. - On 02/06/2021 it was 171,159,133,210. - On 02/05/2021 it was 173,190,151,179. o After meals: N/A o Bedtime: N/A . During the week, how often does your blood glucose drop below 70? Never . Are you checking your feet daily/regularly?   Patient denies any new numbness, pain or tingling in his feet.  Adherence Review: Is the patient currently on a STATIN medication? Yes Is the patient currently on ACE/ARB medication? No Does the patient have >5 day gap between last estimated fill dates? Yes   Maryjean Ka  Follow-Up:  Pharmacist Review   [place] Clinical Pharmacist Assistant [phone]

## 2021-02-03 ENCOUNTER — Encounter: Payer: Self-pay | Admitting: Adult Health

## 2021-02-03 DIAGNOSIS — G4730 Sleep apnea, unspecified: Secondary | ICD-10-CM | POA: Diagnosis not present

## 2021-02-03 DIAGNOSIS — R0683 Snoring: Secondary | ICD-10-CM | POA: Diagnosis not present

## 2021-02-09 ENCOUNTER — Telehealth: Payer: Self-pay | Admitting: *Deleted

## 2021-02-09 NOTE — Telephone Encounter (Signed)
ONO on RA: Mild O2 desats while sleeping.  Can continue on O2 at bedtime.  Per Rexene Edison NP  ATC patient, no answer at [phone], no message left per DPR.  Called wife, Jj Enyeart, [phone] per DPR, advised of results/recommendations per Rexene Edison NP to continue oxygen at 2L at [facility].  She verbalized understanding.  Nothing further needed.

## 2021-02-13 ENCOUNTER — Telehealth: Payer: Self-pay | Admitting: Family Medicine

## 2021-02-13 NOTE — Telephone Encounter (Signed)
Left message for patient to schedule Annual Wellness Visit.  Please schedule with Nurse Health Advisor Martha Stanley, RN at [facility]  °

## 2021-03-09 ENCOUNTER — Other Ambulatory Visit: Payer: Self-pay | Admitting: Family Medicine

## 2021-03-09 ENCOUNTER — Other Ambulatory Visit: Payer: Self-pay | Admitting: Cardiovascular Disease

## 2021-03-09 DIAGNOSIS — K219 Gastro-esophageal reflux disease without esophagitis: Secondary | ICD-10-CM

## 2021-03-15 ENCOUNTER — Ambulatory Visit (INDEPENDENT_AMBULATORY_CARE_PROVIDER_SITE_OTHER): Payer: Medicare Other | Admitting: Family Medicine

## 2021-03-15 ENCOUNTER — Other Ambulatory Visit: Payer: Self-pay

## 2021-03-15 ENCOUNTER — Encounter: Payer: Self-pay | Admitting: Family Medicine

## 2021-03-15 VITALS — BP 110/62 | HR 80 | Temp 97.2°F | Ht 63.0 in | Wt 210.2 lb

## 2021-03-15 DIAGNOSIS — H6121 Impacted cerumen, right ear: Secondary | ICD-10-CM
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
## 2021-03-20 ENCOUNTER — Other Ambulatory Visit: Payer: Self-pay | Admitting: Family Medicine

## 2021-03-20 DIAGNOSIS — K219 Gastro-esophageal reflux disease without esophagitis: Secondary | ICD-10-CM

## 2021-03-20 NOTE — Telephone Encounter (Signed)
Last OV 03/15/21 Last fill for aLL meds 07/06/20 #90/3

## 2021-04-03 ENCOUNTER — Telehealth: Payer: Medicare Other

## 2021-04-04 ENCOUNTER — Ambulatory Visit (INDEPENDENT_AMBULATORY_CARE_PROVIDER_SITE_OTHER): Payer: Medicare Other

## 2021-04-04 ENCOUNTER — Other Ambulatory Visit: Payer: Self-pay | Admitting: Family Medicine

## 2021-04-04 ENCOUNTER — Other Ambulatory Visit: Payer: Self-pay

## 2021-04-04 VITALS — BP 138/70 | HR 78 | Temp 97.5°F | Resp 16 | Ht 63.0 in | Wt 211.4 lb

## 2021-04-04 DIAGNOSIS — Z0000 Encounter for general adult medical examination without abnormal findings: Secondary | ICD-10-CM

## 2021-04-04 NOTE — Patient Instructions (Signed)
[name] , Thank you for taking time to come for your Medicare Wellness Visit. I appreciate your ongoing commitment to your health goals. Please review the following plan we discussed and let me know if I can assist you in the future.   Screening recommendations/referrals: Colonoscopy: No longer required Recommended yearly ophthalmology/optometry visit for glaucoma screening and checkup Recommended yearly dental visit for hygiene and checkup  Vaccinations: Influenza vaccine: Up to date Pneumococcal vaccine: Completed vaccines Tdap vaccine: Up to date-Due-12/25/2023 Shingles vaccine: Completed vaccines   Covid-19: Completed vaccines  Advanced directives: Declined information today  Conditions/risks identified: See problem list  Next appointment: Follow up in one year for your annual wellness visit. 04/10/2022 @ 3:00  Preventive Care 79 Years and Older, Male Preventive care refers to lifestyle choices and visits with your health care provider that can promote health and wellness. What does preventive care include?  A yearly physical exam. This is also called an annual well check.  Dental exams once or twice a year.  Routine eye exams. Ask your health care provider how often you should have your eyes checked.  Personal lifestyle choices, including:  Daily care of your teeth and gums.  Regular physical activity.  Eating a healthy diet.  Avoiding tobacco and drug use.  Limiting alcohol use.  Practicing safe sex.  Taking low doses of aspirin every day.  Taking vitamin and mineral supplements as recommended by your health care provider. What happens during an annual well check? The services and screenings done by your health care provider during your annual well check will depend on your age, overall health, lifestyle risk factors, and family history of disease. Counseling  Your health care provider may ask you questions about your:  Alcohol use.  Tobacco use.  Drug  use.  Emotional well-being.  Home and relationship well-being.  Sexual activity.  Eating habits.  History of falls.  Memory and ability to understand (cognition).  Work and work Statistician. Screening  You may have the following tests or measurements:  Height, weight, and BMI.  Blood pressure.  Lipid and cholesterol levels. These may be checked every 5 years, or more frequently if you are over 32 years old.  Skin check.  Lung cancer screening. You may have this screening every year starting at age 55 if you have a 30-pack-year history of smoking and currently smoke or have quit within the past 15 years.  Fecal occult blood test (FOBT) of the stool. You may have this test every year starting at age 10.  Flexible sigmoidoscopy or colonoscopy. You may have a sigmoidoscopy every 5 years or a colonoscopy every 10 years starting at age 76.  Prostate cancer screening. Recommendations will vary depending on your family history and other risks.  Hepatitis C blood test.  Hepatitis B blood test.  Sexually transmitted disease (STD) testing.  Diabetes screening. This is done by checking your blood sugar (glucose) after you have not eaten for a while (fasting). You may have this done every 1-3 years.  Abdominal aortic aneurysm (AAA) screening. You may need this if you are a current or former smoker.  Osteoporosis. You may be screened starting at age 10 if you are at high risk. Talk with your health care provider about your test results, treatment options, and if necessary, the need for more tests. Vaccines  Your health care provider may recommend certain vaccines, such as:  Influenza vaccine. This is recommended every year.  Tetanus, diphtheria, and acellular pertussis (Tdap, Td) vaccine.  You may need a Td booster every 10 years.  Zoster vaccine. You may need this after age 29.  Pneumococcal 13-valent conjugate (PCV13) vaccine. One dose is recommended after age  37.  Pneumococcal polysaccharide (PPSV23) vaccine. One dose is recommended after age 102. Talk to your health care provider about which screenings and vaccines you need and how often you need them. This information is not intended to replace advice given to you by your health care provider. Make sure you discuss any questions you have with your health care provider. Document Released: 01/06/2016 Document Revised: 08/29/2016 Document Reviewed: 10/11/2015 Elsevier Interactive Patient Education  2017 [place] Prevention in the Home Falls can cause injuries. They can happen to people of all ages. There are many things you can do to make your home safe and to help prevent falls. What can I do on the outside of my home?  Regularly fix the edges of walkways and driveways and fix any cracks.  Remove anything that might make you trip as you walk through a door, such as a raised step or threshold.  Trim any bushes or trees on the path to your home.  Use bright outdoor lighting.  Clear any walking paths of anything that might make someone trip, such as rocks or tools.  Regularly check to see if handrails are loose or broken. Make sure that both sides of any steps have handrails.  Any raised decks and porches should have guardrails on the edges.  Have any leaves, snow, or ice cleared regularly.  Use sand or salt on walking paths during winter.  Clean up any spills in your garage right away. This includes oil or grease spills. What can I do in the bathroom?  Use night lights.  Install grab bars by the toilet and in the tub and shower. Do not use towel bars as grab bars.  Use non-skid mats or decals in the tub or shower.  If you need to sit down in the shower, use a plastic, non-slip stool.  Keep the floor dry. Clean up any water that spills on the floor as soon as it happens.  Remove soap buildup in the tub or shower regularly.  Attach bath mats securely with double-sided  non-slip rug tape.  Do not have throw rugs and other things on the floor that can make you trip. What can I do in the bedroom?  Use night lights.  Make sure that you have a light by your bed that is easy to reach.  Do not use any sheets or blankets that are too big for your bed. They should not hang down onto the floor.  Have a firm chair that has side arms. You can use this for support while you get dressed.  Do not have throw rugs and other things on the floor that can make you trip. What can I do in the kitchen?  Clean up any spills right away.  Avoid walking on wet floors.  Keep items that you use a lot in easy-to-reach places.  If you need to reach something above you, use a strong step stool that has a grab bar.  Keep electrical cords out of the way.  Do not use floor polish or wax that makes floors slippery. If you must use wax, use non-skid floor wax.  Do not have throw rugs and other things on the floor that can make you trip. What can I do with my stairs?  Do not leave any  items on the stairs.  Make sure that there are handrails on both sides of the stairs and use them. Fix handrails that are broken or loose. Make sure that handrails are as long as the stairways.  Check any carpeting to make sure that it is firmly attached to the stairs. Fix any carpet that is loose or worn.  Avoid having throw rugs at the top or bottom of the stairs. If you do have throw rugs, attach them to the floor with carpet tape.  Make sure that you have a light switch at the top of the stairs and the bottom of the stairs. If you do not have them, ask someone to add them for you. What else can I do to help prevent falls?  Wear shoes that:  Do not have high heels.  Have rubber bottoms.  Are comfortable and fit you well.  Are closed at the toe. Do not wear sandals.  If you use a stepladder:  Make sure that it is fully opened. Do not climb a closed stepladder.  Make sure that both  sides of the stepladder are locked into place.  Ask someone to hold it for you, if possible.  Clearly mark and make sure that you can see:  Any grab bars or handrails.  First and last steps.  Where the edge of each step is.  Use tools that help you move around (mobility aids) if they are needed. These include:  Canes.  Walkers.  Scooters.  Crutches.  Turn on the lights when you go into a dark area. Replace any light bulbs as soon as they burn out.  Set up your furniture so you have a clear path. Avoid moving your furniture around.  If any of your floors are uneven, fix them.  If there are any pets around you, be aware of where they are.  Review your medicines with your doctor. Some medicines can make you feel dizzy. This can increase your chance of falling. Ask your doctor what other things that you can do to help prevent falls. This information is not intended to replace advice given to you by your health care provider. Make sure you discuss any questions you have with your health care provider. Document Released: 10/06/2009 Document Revised: 05/17/2016 Document Reviewed: 01/14/2015 Elsevier Interactive Patient Education  2017 [employer].
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
## 2021-04-05 NOTE — Telephone Encounter (Signed)
Last OV 03/15/21 Last fill 07/06/20  #90/3

## 2021-04-10 ENCOUNTER — Telehealth: Payer: Self-pay

## 2021-04-10 NOTE — Progress Notes (Signed)
Spoke to patient to confirmed patient telephone appointment on 04/11/2021 for CCM at 11:15 am with Junius Argyle the Clinical pharmacist.   Patient Verbalized understanding and denies any side effects from any of his current medications   [place] Pharmacist Assistant [phone]

## 2021-04-11 ENCOUNTER — Ambulatory Visit (INDEPENDENT_AMBULATORY_CARE_PROVIDER_SITE_OTHER): Payer: Medicare Other

## 2021-04-11 DIAGNOSIS — E785 Hyperlipidemia, unspecified: Secondary | ICD-10-CM

## 2021-04-11 DIAGNOSIS — I152 Hypertension secondary to endocrine disorders: Secondary | ICD-10-CM | POA: Diagnosis not present

## 2021-04-11 DIAGNOSIS — E1159 Type 2 diabetes mellitus with other circulatory complications: Secondary | ICD-10-CM

## 2021-04-11 DIAGNOSIS — Z794 Long term (current) use of insulin: Secondary | ICD-10-CM

## 2021-04-11 DIAGNOSIS — J449 Chronic obstructive pulmonary disease, unspecified: Secondary | ICD-10-CM

## 2021-04-11 DIAGNOSIS — I5032 Chronic diastolic (congestive) heart failure: Secondary | ICD-10-CM | POA: Diagnosis not present

## 2021-04-11 DIAGNOSIS — E1169 Type 2 diabetes mellitus with other specified complication: Secondary | ICD-10-CM | POA: Diagnosis not present

## 2021-04-11 DIAGNOSIS — E1151 Type 2 diabetes mellitus with diabetic peripheral angiopathy without gangrene: Secondary | ICD-10-CM | POA: Diagnosis not present

## 2021-04-11 DIAGNOSIS — M1A9XX0 Chronic gout, unspecified, without tophus (tophi): Secondary | ICD-10-CM

## 2021-04-11 NOTE — Progress Notes (Signed)
Chronic Care Management Pharmacy Note  04/12/2021 Name:  [name]. MRN:  [id number] DOB:  12-19-1941  Subjective: [name]. is an 80 y.o. year old male who is a primary patient of [name], DO.  The CCM team was consulted for assistance with disease management and care coordination needs.    Engaged with patient by telephone for follow up visit in response to provider referral for pharmacy case management and/or care coordination services.   Consent to Services:  The patient was given information about Chronic Care Management services, agreed to services, and gave verbal consent prior to initiation of services.  Please see initial visit note for detailed documentation.   Patient Care Team: [name], DO as PCP - General (Family Medicine) Burnell Blanks, MD as PCP - Cardiology (Cardiology) Burnell Blanks, MD as Consulting Physician (Cardiology) Clance, Armando Reichert, MD as Consulting Physician (Pulmonary Disease) Estanislado Emms, MD (Inactive) as Consulting Physician (Nephrology) Sharyne Peach, MD as Attending Physician (Ophthalmology) Bensimhon, Shaune Pascal, MD as Attending Physician (Cardiology) Jari Pigg, MD as Attending Physician (Dermatology) Germaine Pomfret, [facility] as Pharmacist (Pharmacist) [name], MD as Consulting Physician (Endocrinology)  Recent office visits: 04/04/21: Patient presented to Caroleen Hamman, LPN for AWV.  7/34/03: Patient presented to Dr. Bryan Lemma for impacted cerumen. Spiriva stopped (change in therapy).  06/07/20: Patient presented to Wilfred Lacy, NP for follow-up. Patient with pain in jaw line. Patient started on Augmentin x 7 days. Patient referred to ENT.  Recent consult visits: 01/26/21: Patient presented to Dr. Angelena Form (Cardiology) for follow-up. No medication changes made.   Hospital visits: None in previous 6 months  Objective:  Lab Results  Component Value Date   CREATININE 1.44  03/02/2020   BUN 34 (H) 03/02/2020   GFR 47.47 (L) 03/02/2020   GFRNONAA 51 (L) 08/20/2017   GFRAA 59 (L) 08/20/2017   NA 135 03/02/2020   K 4.2 03/02/2020   CALCIUM 9.3 03/02/2020   CO2 33 (H) 03/02/2020   GLUCOSE 207 (H) 03/02/2020    Lab Results  Component Value Date/Time   HGBA1C 6.3 (A) 01/24/2021 01:55 PM   HGBA1C 6.2 (A) 10/12/2020 01:46 PM   HGBA1C 7.1 12/29/2019 12:00 AM   HGBA1C 6.9 (H) 05/11/2015 01:56 PM   HGBA1C 7.0 (H) 02/10/2015 09:33 AM   FRUCTOSAMINE 268 08/24/2016 01:50 PM   GFR 47.47 (L) 03/02/2020 04:39 PM   GFR 42.99 (L) 08/29/2018 02:34 PM   MICROALBUR 38.0 (H) 02/10/2015 09:33 AM   MICROALBUR 29.8 (H) 02/10/2014 03:00 PM    Last diabetic Eye exam:  Lab Results  Component Value Date/Time   HMDIABEYEEXA No Retinopathy 08/09/2020 12:00 AM    Last diabetic Foot exam: No results found for: HMDIABFOOTEX   Lab Results  Component Value Date   CHOL 134 07/20/2020   HDL 47.00 07/20/2020   LDLCALC 70 07/20/2020   TRIG 84.0 07/20/2020   CHOLHDL 3 07/20/2020    Hepatic Function Latest Ref Rng & Units 12/29/2019 08/29/2018 07/31/2017  Total Protein 6.0 - 8.3 g/dL - 5.8(L) 5.5(L)  Albumin 3.5 - 5.0 3.0(A) 3.4(L) 3.2(L)  AST 14 - 40 8(A) 12 15  ALT 10 - 40 '21 13 20  ' Alk Phosphatase 39 - 117 U/L - 69 78  Total Bilirubin 0.2 - 1.2 mg/dL - 0.4 0.3  Bilirubin, Direct 0.0 - 0.3 mg/dL - 0.1 0.11    Lab Results  Component Value Date/Time   TSH 2.38 12/29/2019 12:00 AM  TSH 2.10 08/29/2018 02:34 PM   TSH 2.12 04/24/2017 01:35 PM    CBC Latest Ref Rng & Units 12/29/2019 08/29/2018 07/31/2017  WBC - 8.3 8.8 12.3(H)  Hemoglobin 13.5 - 17.5 12.6(A) 12.3(L) 12.0(L)  Hematocrit 41 - 53 40(A) 36.8(L) 34.4(L)  Platelets 150 - 399 203 256.0 279    Lab Results  Component Value Date/Time   VD25OH 35.80 08/29/2018 02:34 PM   VD25OH 26.40 (L) 04/24/2017 01:35 PM    Clinical ASCVD: Yes  The ASCVD Risk score Mikey Bussing DC Jr., et al., 2013) failed to calculate for the  following reasons:   The patient has a prior MI or stroke diagnosis    Depression screen [facility] 2/9 04/04/2021 03/16/2020 10/09/2018  Decreased Interest 0 0 0  Down, Depressed, Hopeless 0 0 0  PHQ - 2 Score 0 0 0  Some recent data might be hidden    Social History   Tobacco Use  Smoking Status Former Smoker  . Packs/day: 2.00  . Years: 41.00  . Pack years: 82.00  . Types: Cigarettes  . Quit date: 12/24/1997  . Years since quitting: 23.3  Smokeless Tobacco Never Used   BP Readings from Last 3 Encounters:  04/04/21 138/70  03/15/21 110/62  01/26/21 (!) 124/56   Pulse Readings from Last 3 Encounters:  04/04/21 78  03/15/21 80  01/26/21 81   Wt Readings from Last 3 Encounters:  04/04/21 211 lb 6.4 oz (95.9 kg)  03/15/21 210 lb 3.2 oz (95.3 kg)  01/26/21 208 lb 12.8 oz (94.7 kg)   BMI Readings from Last 3 Encounters:  04/04/21 37.45 kg/m  03/15/21 37.24 kg/m  01/26/21 36.99 kg/m    Assessment/Interventions: Review of patient past medical history, allergies, medications, health status, including review of consultants reports, laboratory and other test data, was performed as part of comprehensive evaluation and provision of chronic care management services.   SDOH:  (Social Determinants of Health) assessments and interventions performed: Yes SDOH Interventions   Flowsheet Row Most Recent Value  SDOH Interventions   Financial Strain Interventions Intervention Not Indicated     SDOH Screenings   Alcohol Screen: Low Risk   . Last Alcohol Screening Score (AUDIT): 0  Depression (PHQ2-9): Low Risk   . PHQ-2 Score: 0  Financial Resource Strain: Low Risk   . Difficulty of Paying Living Expenses: Not hard at all  Food Insecurity: No Food Insecurity  . Worried About Charity fundraiser in the Last Year: Never true  . Ran Out of Food in the Last Year: Never true  Housing: Low Risk   . Last Housing Risk Score: 0  Physical Activity: Inactive  . Days of Exercise per Week: 0  days  . Minutes of Exercise per Session: 0 min  Social Connections: Moderately Isolated  . Frequency of Communication with Friends and Family: More than three times a week  . Frequency of Social Gatherings with Friends and Family: More than three times a week  . Attends Religious Services: Never  . Active Member of Clubs or Organizations: No  . Attends Archivist Meetings: Never  . Marital Status: Married  Stress: No Stress Concern Present  . Feeling of Stress : Not at all  Tobacco Use: Medium Risk  . Smoking Tobacco Use: Former Smoker  . Smokeless Tobacco Use: Never Used  Transportation Needs: No Transportation Needs  . Lack of Transportation (Medical): No  . Lack of Transportation (Non-Medical): No    CCM Care Plan  Allergies  Allergen Reactions  . Enalapril Maleate Cough    REACTION: cough  . Lisinopril Cough  . Shellfish-Derived Products Swelling    Said occurred twice; has eaten some since and had no reactions    Medications Reviewed Today    Reviewed by Marta Antu, LPN (Licensed Practical Nurse) on 04/04/21 at [place] List Status: <None>  Medication Order Taking? Sig Documenting Provider Last Dose Status Informant  albuterol (VENTOLIN HFA) 108 (90 Base) MCG/ACT inhaler [id number]  Inhale 2 puffs into the lungs every 6 (six) hours as needed for wheezing or shortness of breath. Parrett, Fonnie Mu, NP  Active   allopurinol (ZYLOPRIM) 300 MG tablet [id number]  TAKE 1 TABLET(300 MG) BY MOUTH DAILY [name], DO  Active   AMBULATORY NON FORMULARY MEDICATION [id number]  O2 @ 2LMP @ night [provider]  Active Self  aspirin 81 MG tablet [id number]  Take 81 mg by mouth daily. [provider]  Active Self  Azelastine HCl 137 MCG/SPRAY SOLN [id number]  INSTILL 2 SPRAYS INTO BOTH NOSTRILS TWICE DAILY AS NEEDED FOR RHINITIS. Collene Gobble, MD  Active   B Complex Vitamins (VITAMIN B COMPLEX PO) [id number]  Take 1 tablet by mouth daily. [provider]  Active   budesonide-formoterol [facility]) 160-4.5 MCG/ACT inhaler [id number]  Inhale 2 puffs into the lungs 2 (two) times daily.  Patient not taking: Reported on 03/15/2021   Tanda Rockers, MD  Active   calcitRIOL (ROCALTROL) 0.25 MCG capsule [id number]  Take 1 capsule (0.25 mcg total) by mouth daily. [name], DO  Active   carvedilol (COREG) 25 MG tablet [id number]  TAKE 1 TABLET TWICE DAILY WITH MEALS Burnell Blanks, MD  Active   cetirizine (ZYRTEC) 10 MG tablet [id number]  Take 10 mg by mouth as needed for allergies.  [provider]  Active Self  cholecalciferol (VITAMIN D3) 25 MCG (1000 UNIT) tablet [id number]  Take 1,000 Units by mouth daily. [provider]  Active   citalopram (CELEXA) 10 MG tablet [id number]  TAKE 1 TABLET(10 MG) BY MOUTH DAILY [name], DO  Active   clopidogrel (PLAVIX) 75 MG tablet [id number]  TAKE 1 TABLET EVERY DAY Burnell Blanks, MD  Active   dapagliflozin propanediol (FARXIGA) 5 MG TABS tablet [id number]  Take 2.5 mg by mouth daily. [provider]  Active   diphenhydrAMINE (BENADRYL) 25 MG tablet [id number]  Take 25 mg by mouth every 6 (six) hours as needed. [provider]  Active   Dulaglutide (TRULICITY) 4.5 BH/4.1PF SOPN [id number]  Inject 4.5 mg as directed once a week. [name], MD  Active   finasteride (PROSCAR) 5 MG tablet [id number]  TAKE 1 TABLET EVERY DAY Cirigliano, Mary K, DO  Active   Fluticasone-Salmeterol (WIXELA INHUB) 100-50 MCG/DOSE AEPB [id number]  Inhale 1 puff into the lungs 2 (two) times daily. [provider]  Active   furosemide (LASIX) 40 MG tablet [id number]  TAKE 3 TABLETS BY MOUTH EVERY MORNING AND 2 TABLETS EVERY AFTERNOON. (DOSE INCREASE) Burnell Blanks, MD  Active   GuaiFENesin [facility] MAXIMUM STRENGTH PO) [id number]  Take 1 Dose by mouth as needed (drainage). [provider]  Active   insulin aspart (NOVOLOG) 100 UNIT/ML  injection [id number]  Inject 5 Units into the skin 3 (three) times daily with meals. [name], MD  Active   isosorbide mononitrate (IMDUR) 60 MG 24 hr tablet [id number]  Take 1 tablet (60 mg total) by mouth daily.  [name], DO  Active   ketoconazole (NIZORAL) 2 % cream [id number]  Apply 1 application topically daily as needed for irritation.  [provider]  Active Self  Multiple Vitamins-Minerals (MULTIVITAMIN PO) [id number]  Take 1 tablet by mouth daily. [provider]  Active Self  nitroGLYCERIN (NITROLINGUAL) 0.4 MG/SPRAY spray [id number]  Place 1 spray under the tongue as directed. Burnell Blanks, MD  Active Self  pantoprazole (PROTONIX) 40 MG tablet [id number]  TAKE 1 TABLET EVERY DAY Cirigliano, Mary K, DO  Active   polyethylene glycol powder (GLYCOLAX/MIRALAX) 17 GM/SCOOP powder [id number]  Take 17 g by mouth daily as needed (constipation). [provider]  Active Self  potassium chloride (KLOR-CON) 10 MEQ tablet [id number]  Take 1 tablet (10 mEq total) by mouth daily. [name], DO  Active   Protein (UNJURY UNFLAVORED PO) [id number]  Take 8-16 oz by mouth daily. [provider]  Active Self  rosuvastatin (CRESTOR) 10 MG tablet [id number]  TAKE 1 TABLET EVERY DAY [name], DO  Active   traZODone (DESYREL) 100 MG tablet [id number]  TAKE 1 TABLET(100 MG) BY MOUTH AT BEDTIME Cirigliano, Mary K, DO  Active   triamcinolone (KENALOG) 0.025 % cream [id number]  Apply 1 application topically 3 (three) times daily as needed (skin). [provider]  Active Self          Patient Active Problem List   Diagnosis Date Noted  . Acute bronchitis with COPD ([place]) 03/26/2018  . Renal failure 08/30/2017  . Abnormal x-ray of lung 08/14/2017  . Hypocalcemia 04/24/2017  . Anemia 04/24/2017  . Low back pain 12/28/2016  . Hemoptysis 10/18/2016  . Chronic respiratory failure with hypoxia and hypercapnia ([place]) 09/04/2016  . Fatigue  06/13/2016  . Diabetes ([place]) 02/23/2016  . Jaw pain 10/25/2015  . Fever 08/08/2015  . Pneumonia 09/10/2014  . CHF exacerbation ([place]) 08/30/2014  . COPD (chronic obstructive pulmonary disease) with chronic bronchitis ([place]) 08/30/2014  . Obesity (BMI 30-39.9) 08/30/2014  . Right lower lobe pneumonia 08/30/2014  . Special screening for malignant neoplasms, colon 07/27/2014  . Family history of colon cancer in mother 07/27/2014  . Acute combined systolic and diastolic heart failure ([place]) 02/10/2014  . Yeast infection 08/12/2013  . Difficulty swallowing solids 07/21/2013  . NSTEMI (non-ST elevated myocardial infarction) ([place]) 07/17/2013  . Sepsis ([place]) 07/16/2013  . Elevated troponin 07/16/2013  . UTI (urinary tract infection) 02/23/2013  . 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  . Encounter for long-term (current) use of other medications 11/24/2012  . Screening for malignant neoplasm of prostate 11/24/2012  . DM (diabetes mellitus) ([place]) 11/24/2012  . History of laparoscopic adjustable gastric banding 03/20/2012  . Depression 12/14/2011  . Physical deconditioning 12/14/2011  . GERD (gastroesophageal reflux disease) 10/31/2011  . Paronychia of great toe of right foot 08/22/2011  . Pre-operative cardiovascular examination 03/21/2011  . ABDOMINAL PAIN 01/18/2011  . OBESITY-MORBID (>100') 06/09/2010  . Iron deficiency anemia 05/01/2010  . CHEST PAIN-PRECORDIAL 12/26/2009  . BASAL CELL CARCINOMA, HX OF 10/31/2009  . HYPERTROPHY PROSTATE W/UR OBST & OTH LUTS 07/27/2009  . FOOT PAIN 07/27/2009  . COUGH DUE TO ACE INHIBITORS 06/14/2009  . OTITIS MEDIA, ACUTE, LEFT 03/14/2009  . GI BLEEDING 02/16/2009  . HYPERTENSION, BENIGN 02/02/2009  . CAD, NATIVE VESSEL 02/02/2009  . DIASTOLIC HEART FAILURE, CHRONIC 02/02/2009  . EDEMA 11/22/2008  . CONJUNCTIVITIS, RIGHT 06/18/2008  . HYPOKALEMIA 06/03/2008  . CHF 06/03/2008  . OSTEOPOROSIS 04/16/2008  .  Essential hypertension  04/01/2008  . Gout 03/25/2008  . 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 PERIPHERAL VASCULAR DISEASE 09/01/2007  . Disorder resulting from impaired renal function 09/01/2007  . Controlled diabetes mellitus type 2 with complications ([place]) 41/74/0814  . Dyslipidemia 05/12/2007  . IMPOTENCE 05/12/2007  . 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 Allergic rhinitis 05/12/2007  . COPD (chronic obstructive pulmonary disease) with emphysema ([place]) 05/12/2007  . OSTEOARTHRITIS 05/12/2007  . SINUS HEADACHE 05/12/2007    Immunization History  Administered Date(s) Administered  . Fluad Quad(high Dose 65+) 08/25/2019, 10/04/2020  . Influenza Split 09/14/2011, 09/23/2012, 09/03/2013  . Influenza Whole 09/10/2008, 09/23/2009, 08/24/2010  . Influenza, High Dose Seasonal PF 09/12/2015, 09/19/2016, 10/28/2017, 08/29/2018  . Influenza,inj,Quad PF,6+ Mos 09/13/2014  . Moderna Sars-Covid-2 Vaccination 01/06/2020, 02/03/2020, 08/24/2020  . Pneumococcal Conjugate-13 11/11/2014  . Pneumococcal Polysaccharide-23 07/24/1996, 07/17/2013, 01/06/2015  . Td 02/22/2004  . Tdap 12/24/2013  . Zoster Recombinat (Shingrix) 12/12/2020, 03/30/2021    Conditions to be addressed/monitored:  Hypertension, Hyperlipidemia, Diabetes, Heart Failure, Coronary Artery Disease, GERD, COPD, Chronic Kidney Disease, Depression, Osteoporosis, Osteoarthritis, BPH, Gout and Allergic Rhinitis  Care Plan : General Pharmacy (Adult)  Updates made by Germaine Pomfret, RPH since 04/12/2021 12:00 AM    Problem: Hypertension, Hyperlipidemia, Diabetes, Heart Failure, Coronary Artery Disease, GERD, COPD, Chronic Kidney Disease, Depression, Osteoporosis, Osteoarthritis, BPH, Gout and Allergic Rhinitis   Priority: High    Long-Range Goal: Patient-Specific Goal   Start Date: 04/12/2021  Expected End Date: 10/12/2021  This Visit's Progress: On track  Priority: High  Note:   Current Barriers:  . Unable to independently  afford treatment regimen  Pharmacist Clinical Goal(s):  Marland Kitchen Patient will verbalize ability to afford treatment regimen . maintain control of Heart Failure as evidenced by stable weight, lack of exacerbations  through collaboration with PharmD and provider.   Interventions: . 1:1 collaboration with [name], DO regarding development and update of comprehensive plan of care as evidenced by provider attestation and co-signature . Inter-disciplinary care team collaboration (see longitudinal plan of care) . Comprehensive medication review performed; medication list updated in electronic medical record  Heart Failure (Goal: manage symptoms and prevent exacerbations) -Managed by Dr. Angelena Form  -Controlled -Last ejection fraction: 55-60% (Date: Feb 2019) -HF type: Diastolic -NYHA Class: II (slight limitation of activity) -AHA HF Stage: C (Heart disease and symptoms present) -Current treatment: . Carvedilol 25 mg twice daily  . Furosemide 40 mg 3 tablets AM, 2 tablets PM  . Isosorbide Mononitrate 60 mg daily  -Medications previously tried: NA  -Current home BP/HR readings: NA -Current dietary habits: Limiting salt.   -Current exercise habits: Previously going to [place] on Importance of weighing daily; if you gain more than 3 pounds in one day or 5 pounds in one week, contact provider's office -Recommended to continue current medication  Hyperlipidemia: (LDL goal < 70) -Controlled -Current treatment: . Rosuvastatin 10 mg daily  -Current antiplatelet treatment: . Aspirin 81 mg daily  . Clopidogrel 75 mg daily  -Medications previously tried: NA  -Educated on Importance of limiting foods high in cholesterol; -Recommended to continue current medication  Diabetes (A1c goal <8%) -Controlled -Current medications: . Farxiga 5 mg 1/2 tablet daily  . Trulicity 4.5 mg weekly  . Novolog 5 untis three times daily with meals  -Medications previously tried: NA  -Current home  glucose readings . On 02/07/2021 it was [phone]. . On 02/06/2021 it was 171,159,133,210. . On 02/05/2021 it was [phone] -Denies hypoglycemic/hyperglycemic  symptoms -Educated on Benefits of routine self-monitoring of blood sugar; Carbohydrate counting and/or plate method -Counseled to check feet daily and get yearly eye exams -Recommended to continue current medication Assessed patient finances. Wilder Glade, Trulicity covered under patient assistance through 2022  COPD (Goal: control symptoms and prevent exacerbations) -Controlled -Current treatment  . Ventolin HFA 2 puffs every 6 hours as needed  . Spiriva Respimat 1 puff daily  . Wixela 1 puff twice daily  -Current allergic rhinitis  treatment  . Azelastine Nasal spray 2 spray twice daily as needed  . Cetirizine 10 mg daily  . Benadryl 25 mg nightly  . Guaifenesin daily as needed - Drainage  -Medications previously tried: NA  -Gold Grade: Gold 3 (FEV1 30-49%) -Current COPD Classification:  B (high sx, <2 exacerbations/yr) -MMRC/CAT score: 10  -Pulmonary function testing: FEV1 39% (2013) -Exacerbations requiring treatment in last 6 months: No -Patient reports consistent use of maintenance inhaler -Frequency of rescue inhaler use: few times monthly -Counseled on Benefits of consistent maintenance inhaler use When to use rescue inhaler -Recommended to continue current medication  Depression/Insomnia (Goal: Maintain stable mood/sleep) -Controlled -Current treatment: . Citalopram 10 mg daily  . Trazodone 100 mg nightly  -Medications previously tried/failed: NA -PHQ9: 0 -GAD7: NA - A couple of nights a year he has a hard time falling asleep. He attributes this to barimetric changes in the weather. Overall feels he has good sleep quality, sleeps soundly through the night. He tends to sleep from 3a-11a due to caring for his daughter who suffers from [place].  -Educated on Benefits of medication for symptom control   -Counseled on proper sleep hygiene -Recommended to continue current medication  Gout (Goal: Prevent gout flares) -Controlled -Current treatment  . Allopurinol 300 mg daily  -Medications previously tried: NA -Last gout flare: 2021  -Counseled patient on low-purine diet -Recommended to continue current medication  Patient Goals/Self-Care Activities . Patient will:  - check glucose before meals and at bedtime, document, and provide at future appointments check blood pressure 2-3 times weekly, document, and provide at future appointments weigh daily, and contact provider if weight gain of greater than 3 pounds in one day  Follow Up Plan: Telephone follow up appointment with care management team member scheduled for:  07/11/2021 at 11:15 AM      Medication Assistance: Wilder [place].  Enrollment ends Dec 3343 Trulicity obtained through [employer] medication assistance program.  Enrollment ends Dec 2022  Patient's preferred pharmacy is:  [facility] [address] [place] [address] Phone: [phone] Fax: [phone]  [facility] [address] [address] Phone: [phone] Fax: [phone]  Uses pill box? Yes Pt endorses 100% compliance  We discussed: Current pharmacy is preferred with insurance plan and patient is satisfied with pharmacy services Patient decided to: Continue current medication management strategy  Care Plan and Follow Up Patient Decision:  Patient agrees to Care Plan and Follow-up.  Plan: Telephone follow up appointment with care management team member scheduled for:  07/11/2021 at 11:15 AM  Junius Argyle, PharmD, CPP Clinical Pharmacist [facility]  [phone]

## 2021-04-12 NOTE — Patient Instructions (Signed)
Visit Information It was great speaking with you today!  Please let me know if you have any questions about our visit.  Goals Addressed            This Visit's Progress   . Patient Stated   Not on track    Increase activity    . Track and Manage Fluids and Swelling-Heart Failure       Timeframe:  Long-Range Goal Priority:  High Start Date:  04/12/2021                           Expected End Date:  10/12/2021                     Follow Up Date 05/22/2021   - call office if I gain more than 2 pounds in one day or 5 pounds in one week - keep legs up while sitting - use salt in moderation - watch for swelling in feet, ankles and legs every day - weigh myself daily    Why is this important?    It is important to check your weight daily and watch how much salt and liquids you have.   It will help you to manage your heart failure.    Notes:        Patient Care Plan: General Pharmacy (Adult)    Problem Identified: Hypertension, Hyperlipidemia, Diabetes, Heart Failure, Coronary Artery Disease, GERD, COPD, Chronic Kidney Disease, Depression, Osteoporosis, Osteoarthritis, BPH, Gout and Allergic Rhinitis   Priority: High    Long-Range Goal: Patient-Specific Goal   Start Date: 04/12/2021  Expected End Date: 10/12/2021  This Visit's Progress: On track  Priority: High  Note:   Current Barriers:  . Unable to independently afford treatment regimen  Pharmacist Clinical Goal(s):  Marland Kitchen Patient will verbalize ability to afford treatment regimen . maintain control of Heart Failure as evidenced by stable weight, lack of exacerbations  through collaboration with PharmD and provider.   Interventions: . 1:1 collaboration with [name], DO regarding development and update of comprehensive plan of care as evidenced by provider attestation and co-signature . Inter-disciplinary care team collaboration (see longitudinal plan of care) . Comprehensive medication review performed;  medication list updated in electronic medical record  Heart Failure (Goal: manage symptoms and prevent exacerbations) -Managed by Dr. Angelena Form  -Controlled -Last ejection fraction: 55-60% (Date: Feb 2019) -HF type: Diastolic -NYHA Class: II (slight limitation of activity) -AHA HF Stage: C (Heart disease and symptoms present) -Current treatment: . Carvedilol 25 mg twice daily  . Furosemide 40 mg 3 tablets AM, 2 tablets PM  . Isosorbide Mononitrate 60 mg daily  -Medications previously tried: NA  -Current home BP/HR readings: NA -Current dietary habits: Limiting salt.   -Current exercise habits: Previously going to [place] on Importance of weighing daily; if you gain more than 3 pounds in one day or 5 pounds in one week, contact provider's office -Recommended to continue current medication  Hyperlipidemia: (LDL goal < 70) -Controlled -Current treatment: . Rosuvastatin 10 mg daily  -Current antiplatelet treatment: . Aspirin 81 mg daily  . Clopidogrel 75 mg daily  -Medications previously tried: NA  -Educated on Importance of limiting foods high in cholesterol; -Recommended to continue current medication  Diabetes (A1c goal <8%) -Controlled -Current medications: . Farxiga 5 mg 1/2 tablet daily  . Trulicity 4.5 mg weekly  . Novolog 5 untis three times daily with meals  -Medications previously  tried: NA  -Current home glucose readings . On 02/07/2021 it was [phone]. . On 02/06/2021 it was [phone]. . On 02/05/2021 it was [phone] -Denies hypoglycemic/hyperglycemic symptoms -Educated on Benefits of routine self-monitoring of blood sugar; Carbohydrate counting and/or plate method -Counseled to check feet daily and get yearly eye exams -Recommended to continue current medication Assessed patient finances. Wilder Glade, Trulicity covered under patient assistance through 2022  COPD (Goal: control symptoms and prevent exacerbations) -Controlled -Current  treatment  . Ventolin HFA 2 puffs every 6 hours as needed  . Spiriva Respimat 1 puff daily  . Wixela 1 puff twice daily  -Current allergic rhinitis  treatment  . Azelastine Nasal spray 2 spray twice daily as needed  . Cetirizine 10 mg daily  . Benadryl 25 mg nightly  . Guaifenesin daily as needed - Drainage  -Medications previously tried: NA  -Gold Grade: Gold 3 (FEV1 30-49%) -Current COPD Classification:  B (high sx, <2 exacerbations/yr) -MMRC/CAT score: 10  -Pulmonary function testing: FEV1 39% (2013) -Exacerbations requiring treatment in last 6 months: No -Patient reports consistent use of maintenance inhaler -Frequency of rescue inhaler use: few times monthly -Counseled on Benefits of consistent maintenance inhaler use When to use rescue inhaler -Recommended to continue current medication  Depression/Insomnia (Goal: Maintain stable mood/sleep) -Controlled -Current treatment: . Citalopram 10 mg daily  . Trazodone 100 mg nightly  -Medications previously tried/failed: NA -PHQ9: 0 -GAD7: NA - A couple of nights a year he has a hard time falling asleep. He attributes this to barimetric changes in the weather. Overall feels he has good sleep quality, sleeps soundly through the night. He tends to sleep from 3a-11a due to caring for his daughter who suffers from [place].  -Educated on Benefits of medication for symptom control  -Counseled on proper sleep hygiene -Recommended to continue current medication  Gout (Goal: Prevent gout flares) -Controlled -Current treatment  . Allopurinol 300 mg daily  -Medications previously tried: NA -Last gout flare: 2021  -Counseled patient on low-purine diet -Recommended to continue current medication  Patient Goals/Self-Care Activities . Patient will:  - check glucose before meals and at bedtime, document, and provide at future appointments check blood pressure 2-3 times weekly, document, and provide at future appointments weigh daily, and  contact provider if weight gain of greater than 3 pounds in one day  Follow Up Plan: Telephone follow up appointment with care management team member scheduled for:  07/11/2021 at 11:15 AM    Patient agreed to services and verbal consent obtained.   The patient verbalized understanding of instructions, educational materials, and care plan provided today and declined offer to receive copy of patient instructions, educational materials, and care plan.   Junius Argyle, PharmD, CPP Clinical Pharmacist [facility]  [phone]

## 2021-04-17 ENCOUNTER — Other Ambulatory Visit: Payer: Self-pay

## 2021-04-17 ENCOUNTER — Encounter: Payer: Self-pay | Admitting: Emergency Medicine

## 2021-04-17 ENCOUNTER — Ambulatory Visit (INDEPENDENT_AMBULATORY_CARE_PROVIDER_SITE_OTHER): Payer: Medicare Other | Admitting: Emergency Medicine

## 2021-04-17 DIAGNOSIS — J449 Chronic obstructive pulmonary disease, unspecified: Secondary | ICD-10-CM

## 2021-04-17 DIAGNOSIS — J301 Allergic rhinitis due to pollen: Secondary | ICD-10-CM

## 2021-04-17 DIAGNOSIS — J9612 Chronic respiratory failure with hypercapnia: Secondary | ICD-10-CM | POA: Diagnosis not present

## 2021-04-17 DIAGNOSIS — J9611 Chronic respiratory failure with hypoxia: Secondary | ICD-10-CM | POA: Diagnosis not present

## 2021-04-17 DIAGNOSIS — I255 Ischemic cardiomyopathy: Secondary | ICD-10-CM

## 2021-04-17 DIAGNOSIS — K219 Gastro-esophageal reflux disease without esophagitis: Secondary | ICD-10-CM | POA: Diagnosis not present

## 2021-04-17 NOTE — Patient Instructions (Addendum)
Please continue Spiriva Respimat 2 puffs once daily Please continue Wixela 1 inhalation twice a day.  Rinse and gargle after using this medication. Keep your albuterol available to use 2 puffs if needed for shortness of breath, chest tightness, wheezing. Continue your generic Zyrtec Continue Protonix as you have been taking it. Agree with your plan to restart your exercise routine at the [facility]. COVID-19 vaccine is up-to-date Follow with Dr. Lamonte Sakai in 12 months or sooner if you have any problems.

## 2021-04-17 NOTE — Assessment & Plan Note (Signed)
Plan to continue generic Zyrtec

## 2021-04-17 NOTE — Assessment & Plan Note (Signed)
COPD, stable dyspnea and functional limitation.  He is also little to ground since he stopped going to the [facility] but is planning to restart.  Tolerated the switch from Symbicort to [place] through the [place].  He is on this as well as Spiriva Respimat.  Minimal albuterol use.  No exacerbations.

## 2021-04-17 NOTE — Assessment & Plan Note (Signed)
With episodic overt reflux following his Lap-Band surgery.  Has not been as problematic although does still sometimes occur.  No episodes of bronchitis, aspiration events, aspiration pneumonia.  Remains on Protonix

## 2021-04-17 NOTE — Assessment & Plan Note (Signed)
On nocturnal oxygen.  Stable.

## 2021-04-17 NOTE — Progress Notes (Signed)
   Subjective:    Patient ID: [name]., male    DOB: [birth year], 80 y.o.   MRN: [id number]  HPI  ROV 04/17/21 --80 year old gentleman with a history of COPD, chronic rhinitis, hypertension with diastolic CHF, chronic hypoxemic respiratory failure on nocturnal oxygen.  He has a history of frequent recurrent right lower lobe pneumonias in the setting of recurrent aspiration following Lap-Band surgery, hiatal hernia.  Manipulation of his Lap-Band 2 yrs ago has helped with his aspiration symptoms.  Has been managed on Symbicort and Spiriva.  He has albuterol which he uses very rarely.  He is on oxygen 2 L/min while sleeping Fewer episodes of regurgitation, reflux into his mouth. No PNA's or resp infections in the last year. He does not exert very much, but he has not felt SOB. He was going to the [facility] before the most recent increase of COVID.   He is currently managed on Spiriva, Wixella through the [place]. He is on generic zyrtec. Protonix.   Review of Systems As per HPI     Objective:   Physical Exam Vitals:   04/17/21 1609  BP: 114/68  Pulse: 94  Temp: 98.5 F (36.9 C)  TempSrc: Temporal  SpO2: 95%  Weight: 211 lb (95.7 kg)  Height: 5\' 3"  (1.6 m)   Gen: Pleasant, overwt, in no distress,  normal affect  ENT: No lesions,  mouth clear,  oropharynx clear, no postnasal drip  Neck: No JVD, no stridor  Lungs: No use of accessory muscles, distant, clear bilaterally without wheeze, decreased at both bases.   CV: regular, no M gallops, trace ankle edema  Musculoskeletal: No deformities, no cyanosis or clubbing  Neuro: alert, non focal  Skin: Warm, no lesions or rash       Assessment & Plan:  COPD (chronic obstructive pulmonary disease) with chronic bronchitis (HCC) COPD, stable dyspnea and functional limitation.  He is also little to ground since he stopped going to the [facility] but is planning to restart.  Tolerated the switch from Symbicort to [place] through the [place].  He is  on this as well as Spiriva Respimat.  Minimal albuterol use.  No exacerbations.  Chronic respiratory failure with hypoxia and hypercapnia (HCC) On nocturnal oxygen.  Stable.  GERD (gastroesophageal reflux disease) With episodic overt reflux following his Lap-Band surgery.  Has not been as problematic although does still sometimes occur.  No episodes of bronchitis, aspiration events, aspiration pneumonia.  Remains on Protonix  Allergic rhinitis Plan to continue generic Zyrtec  [name], MD, PhD 04/17/2021, 4:54 PM [facility] [phone] or if no answer [phone]

## 2021-04-20 ENCOUNTER — Telehealth: Payer: Self-pay

## 2021-04-20 NOTE — Telephone Encounter (Signed)
Pt has been approved thru [facility] [facility] [employer] Program effective 04/18/2021-12/23/2021

## 2021-04-25 ENCOUNTER — Ambulatory Visit (INDEPENDENT_AMBULATORY_CARE_PROVIDER_SITE_OTHER): Payer: Medicare Other | Admitting: Endocrinology

## 2021-04-25 ENCOUNTER — Other Ambulatory Visit: Payer: Self-pay

## 2021-04-25 VITALS — BP 110/70 | HR 89 | Ht 63.0 in | Wt 210.2 lb

## 2021-04-25 DIAGNOSIS — E1151 Type 2 diabetes mellitus with diabetic peripheral angiopathy without gangrene: Secondary | ICD-10-CM

## 2021-04-25 DIAGNOSIS — I255 Ischemic cardiomyopathy: Secondary | ICD-10-CM

## 2021-04-25 DIAGNOSIS — Z794 Long term (current) use of insulin: Secondary | ICD-10-CM | POA: Diagnosis not present

## 2021-04-25 MED ORDER — REPAGLINIDE 1 MG PO TABS: 1.0000 mg | ORAL_TABLET | Freq: Three times a day (TID) | ORAL | 3 refills | Status: DC
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
## 2021-04-25 NOTE — Patient Instructions (Addendum)
please continue the same Trulicity and [place].   check your blood sugar twice a day.  vary the time of day when you check, between before the 3 meals, and at bedtime.  also check if you have symptoms of your blood sugar being too high or too low.  please keep a record of the readings and bring it to your next appointment here (or you can bring the meter itself).  You can write it on any piece of paper.  please call us sooner if your blood sugar goes below 70, or if you have a lot of readings over 200.  I have sent a prescription to your pharmacy, to change the insulin to "repaglinide."   Please come back for a follow-up appointment in 4 months

## 2021-04-26 ENCOUNTER — Telehealth: Payer: Self-pay

## 2021-04-26 NOTE — Progress Notes (Signed)
Chronic Care Management Pharmacy Assistant   Name: [name].  MRN: [id number] DOB: May 09, 1941  Reason for Encounter:Hypertension and Heart Failure Disease State Call.   Recent office visits:  No recent Office Visit  Recent consult visits:  04/25/2021 Dr.Ellison MD (Ednocrinology) Change Insulin Aspart to Repaglinide 1 mg 04/17/2021 Dr.[name] MD (Pulmonary)  Hospital visits:  None in previous 6 months  Medications: Outpatient Encounter Medications as of 04/26/2021  Medication Sig  . albuterol (VENTOLIN HFA) 108 (90 Base) MCG/ACT inhaler Inhale 2 puffs into the lungs every 6 (six) hours as needed for wheezing or shortness of breath.  . allopurinol (ZYLOPRIM) 300 MG tablet TAKE 1 TABLET(300 MG) BY MOUTH DAILY  . AMBULATORY NON FORMULARY MEDICATION O2 @ 2LMP @ night  . aspirin 81 MG tablet Take 81 mg by mouth daily.  . Azelastine HCl 137 MCG/SPRAY SOLN INSTILL 2 SPRAYS INTO BOTH NOSTRILS TWICE DAILY AS NEEDED FOR RHINITIS.  Marland Kitchen B Complex Vitamins (VITAMIN B COMPLEX PO) Take 1 tablet by mouth daily.  . calcitRIOL (ROCALTROL) 0.25 MCG capsule Take 1 capsule (0.25 mcg total) by mouth daily.  . carvedilol (COREG) 25 MG tablet TAKE 1 TABLET TWICE DAILY WITH MEALS  . cetirizine (ZYRTEC) 10 MG tablet Take 10 mg by mouth as needed for allergies.   . cholecalciferol (VITAMIN D3) 25 MCG (1000 UNIT) tablet Take 1,000 Units by mouth daily.  . citalopram (CELEXA) 10 MG tablet TAKE 1 TABLET(10 MG) BY MOUTH DAILY  . clopidogrel (PLAVIX) 75 MG tablet TAKE 1 TABLET EVERY DAY  . dapagliflozin propanediol (FARXIGA) 5 MG TABS tablet Take 2.5 mg by mouth daily.  . diphenhydrAMINE (BENADRYL) 25 MG tablet Take 25 mg by mouth at bedtime.  . Dulaglutide (TRULICITY) 4.5 EQ/6.8TM SOPN Inject 4.5 mg as directed once a week.  . finasteride (PROSCAR) 5 MG tablet TAKE 1 TABLET EVERY DAY  . Fluticasone-Salmeterol (ADVAIR) 250-50 MCG/DOSE AEPB Inhale 1 puff into the lungs 2 (two) times daily.  . furosemide  (LASIX) 40 MG tablet TAKE 3 TABLETS BY MOUTH EVERY MORNING AND 2 TABLETS EVERY AFTERNOON. (DOSE INCREASE)  . GuaiFENesin (MUCINEX MAXIMUM STRENGTH PO) Take 1 Dose by mouth as needed (drainage).  . isosorbide mononitrate (IMDUR) 60 MG 24 hr tablet Take 1 tablet (60 mg total) by mouth daily.  Marland Kitchen ketoconazole (NIZORAL) 2 % cream Apply 1 application topically daily as needed for irritation.   . Multiple Vitamins-Minerals (MULTIVITAMIN PO) Take 1 tablet by mouth daily.  . nitroGLYCERIN (NITROLINGUAL) 0.4 MG/SPRAY spray Place 1 spray under the tongue as directed.  . pantoprazole (PROTONIX) 40 MG tablet TAKE 1 TABLET EVERY DAY  . polyethylene glycol powder (GLYCOLAX/MIRALAX) 17 GM/SCOOP powder Take 17 g by mouth daily as needed (constipation).  . potassium chloride (KLOR-CON) 10 MEQ tablet Take 1 tablet (10 mEq total) by mouth daily.  . Protein (UNJURY UNFLAVORED PO) Take 8-16 oz by mouth daily.  . repaglinide (PRANDIN) 1 MG tablet Take 1 tablet (1 mg total) by mouth 3 (three) times daily before meals.  . rosuvastatin (CRESTOR) 10 MG tablet TAKE 1 TABLET EVERY DAY  . Tiotropium Bromide Monohydrate (SPIRIVA RESPIMAT) 2.5 MCG/ACT AERS Inhale 1 puff into the lungs daily.  . traZODone (DESYREL) 100 MG tablet TAKE 1 TABLET AT BEDTIME  . triamcinolone (KENALOG) 0.025 % cream Apply 1 application topically 3 (three) times daily as needed (skin).   No facility-administered encounter medications on file as of 04/26/2021.    Star Rating Drugs: Farxiga 5 mg covered under  patient assistance through 2022. Trulicity 4.5 PY/1.9JK covered under patient assistance through 2022. Rosuvastatin 10 mg last filled on 11/26/2020 for 90 day supply.   Reviewed chart prior to disease state call. Spoke with patient regarding BP  Recent Office Vitals: BP Readings from Last 3 Encounters:  04/25/21 110/70  04/17/21 114/68  04/04/21 138/70   Pulse Readings from Last 3 Encounters:  04/25/21 89  04/17/21 94  04/04/21 78     Wt Readings from Last 3 Encounters:  04/25/21 210 lb 3.2 oz (95.3 kg)  04/17/21 211 lb (95.7 kg)  04/04/21 211 lb 6.4 oz (95.9 kg)     Kidney Function Lab Results  Component Value Date/Time   CREATININE 1.44 03/02/2020 04:39 PM   CREATININE 1.6 (A) 12/29/2019 12:00 AM   CREATININE 1.66 (H) 08/29/2018 02:34 PM   CREATININE 1.35 05/10/2014 01:46 PM   GFR 47.47 (L) 03/02/2020 04:39 PM   GFRNONAA 51 (L) 08/20/2017 03:42 PM   GFRNONAA 52 (L) 05/10/2014 01:46 PM   GFRAA 59 (L) 08/20/2017 03:42 PM   GFRAA 60 05/10/2014 01:46 PM    BMP Latest Ref Rng & Units 03/02/2020 12/29/2019 08/29/2018  Glucose 70 - 99 mg/dL 207(H) - -  BUN 6 - 23 mg/dL 34(H) - -  Creatinine 0.40 - 1.50 mg/dL 1.44 1.6(A) -  BUN/Creat Ratio 10 - 24 - - -  Sodium 135 - 145 mEq/L 135 137 -  Potassium 3.5 - 5.1 mEq/L 4.2 4.0 -  Chloride 96 - 112 mEq/L 96 97(A) -  CO2 19 - 32 mEq/L 33(H) 36(A) -  Calcium 8.4 - 10.5 mg/dL 9.3 9.3 9.4    . Current antihypertensive and Heart Failure regimen:   Carvedilol 25 mg twice daily   Furosemide 40 mg 3 tablets AM, 2 tablets PM   Isosorbide Mononitrate 60 mg daily   Patient denies headaches,dizzinees or lightheadedness. Patient denies any issue with his current medications. . How often are you checking your Blood Pressure? infrequently . Current home BP readings:  o Patient states his blood pressure ranges around 120-30/70. On 04/26/2021 it was 110/70. Marland Kitchen What recent interventions/DTPs have been made by any provider to improve Blood Pressure control since last CPP Visit: None ID . Any recent hospitalizations or ED visits since last visit with CPP? No . What diet changes have been made to improve Blood Pressure Control?  o No Changes Indicated  . What exercise is being done to improve your Blood Pressure Control?  o Patient states he does not exercise.  Adherence Review: Is the patient currently on ACE/ARB medication? No Does the patient have >5 day gap between last  estimated fill dates? Yes  Rosuvastatin 10 mg last filled on 11/26/2020 for 90 day supply at [facility].  Patient denies side effects from Rosuvastatin.Patient states he takes Rosuvastatin as prescribe.Patient states he only get his Rosuvastatin from [facility]. I reached out to [facility] to confirm the last filled date.Per [place] patient last filled Rosuvastatin 10 mg on 04/21/2021 for 90 day supply.  [place] Pharmacist Assistant [phone]

## 2021-05-03 ENCOUNTER — Telehealth: Payer: Self-pay | Admitting: Endocrinology

## 2021-05-03 NOTE — Telephone Encounter (Signed)
Pt calling stating that medication was changed from the insulin to repaglinide (PRANDIN) 1 MG tablet pt states that his blood sugar has been higher than when he was on the insulin. Blood sugar has been [phone] patient would like a call back as soon as possible on what to do.

## 2021-05-04 MED ORDER — REPAGLINIDE 2 MG PO TABS: 2.0000 mg | ORAL_TABLET | Freq: Three times a day (TID) | ORAL | 3 refills | Status: DC

## 2021-05-04 NOTE — Telephone Encounter (Signed)
Called pt informed of changes to medication. Pt understands

## 2021-05-04 NOTE — Telephone Encounter (Signed)
I have sent a prescription to your pharmacy, to double the repaglinide.  I hope that we don't have to go back to the insulin.  Please continue the same medications, including the Trulicity.

## 2021-05-05 DIAGNOSIS — R42 Dizziness and giddiness: Secondary | ICD-10-CM | POA: Diagnosis not present

## 2021-05-05 DIAGNOSIS — W19XXXA Unspecified fall, initial encounter: Secondary | ICD-10-CM | POA: Diagnosis not present

## 2021-05-05 DIAGNOSIS — I4430 Unspecified atrioventricular block: Secondary | ICD-10-CM | POA: Diagnosis not present

## 2021-05-07 ENCOUNTER — Encounter: Payer: Self-pay | Admitting: Endocrinology

## 2021-05-16 ENCOUNTER — Other Ambulatory Visit: Payer: Self-pay | Admitting: Family Medicine

## 2021-05-25 DIAGNOSIS — D0439 Carcinoma in situ of skin of other parts of face: Secondary | ICD-10-CM | POA: Diagnosis not present

## 2021-05-25 DIAGNOSIS — D044 Carcinoma in situ of skin of scalp and neck: Secondary | ICD-10-CM | POA: Diagnosis not present

## 2021-05-25 DIAGNOSIS — Z85828 Personal history of other malignant neoplasm of skin: Secondary | ICD-10-CM | POA: Diagnosis not present

## 2021-05-25 DIAGNOSIS — Z86018 Personal history of other benign neoplasm: Secondary | ICD-10-CM | POA: Diagnosis not present

## 2021-05-25 DIAGNOSIS — D2239 Melanocytic nevi of other parts of face: Secondary | ICD-10-CM | POA: Diagnosis not present

## 2021-05-25 DIAGNOSIS — D485 Neoplasm of uncertain behavior of skin: Secondary | ICD-10-CM | POA: Diagnosis not present

## 2021-05-25 DIAGNOSIS — L570 Actinic keratosis: Secondary | ICD-10-CM | POA: Diagnosis not present

## 2021-05-25 DIAGNOSIS — L578 Other skin changes due to chronic exposure to nonionizing radiation: Secondary | ICD-10-CM | POA: Diagnosis not present

## 2021-05-25 DIAGNOSIS — L821 Other seborrheic keratosis: Secondary | ICD-10-CM | POA: Diagnosis not present

## 2021-06-09 ENCOUNTER — Telehealth: Payer: Self-pay

## 2021-06-09 NOTE — Progress Notes (Signed)
Chronic Care Management Pharmacy Assistant   Name: [name].  MRN: [id number] DOB: 15-Feb-1941  Reason for Encounter:Diabetes Disease State Call.   Recent office visits:  No recent office visits  Recent consult visits:  05/03/2021 Dr. Loanne Drilling MD (Endocrinology) Increase Repaglinide to 2 mg 3 times daily    Hospital visits:  None in previous 6 months  Medications: Outpatient Encounter Medications as of 06/09/2021  Medication Sig   albuterol (VENTOLIN HFA) 108 (90 Base) MCG/ACT inhaler Inhale 2 puffs into the lungs every 6 (six) hours as needed for wheezing or shortness of breath.   allopurinol (ZYLOPRIM) 300 MG tablet TAKE 1 TABLET(300 MG) BY MOUTH DAILY   AMBULATORY NON FORMULARY MEDICATION O2 @ 2LMP @ night   aspirin 81 MG tablet Take 81 mg by mouth daily.   Azelastine HCl 137 MCG/SPRAY SOLN INSTILL 2 SPRAYS INTO BOTH NOSTRILS TWICE DAILY AS NEEDED FOR RHINITIS.   B Complex Vitamins (VITAMIN B COMPLEX PO) Take 1 tablet by mouth daily.   calcitRIOL (ROCALTROL) 0.25 MCG capsule Take 1 capsule (0.25 mcg total) by mouth daily.   carvedilol (COREG) 25 MG tablet TAKE 1 TABLET TWICE DAILY WITH MEALS   cetirizine (ZYRTEC) 10 MG tablet Take 10 mg by mouth as needed for allergies.    cholecalciferol (VITAMIN D3) 25 MCG (1000 UNIT) tablet Take 1,000 Units by mouth daily.   citalopram (CELEXA) 10 MG tablet TAKE 1 TABLET(10 MG) BY MOUTH DAILY   clopidogrel (PLAVIX) 75 MG tablet TAKE 1 TABLET EVERY DAY   dapagliflozin propanediol (FARXIGA) 5 MG TABS tablet Take 2.5 mg by mouth daily.   diphenhydrAMINE (BENADRYL) 25 MG tablet Take 25 mg by mouth at bedtime.   Dulaglutide (TRULICITY) 4.5 PZ/0.2HE SOPN Inject 4.5 mg as directed once a week.   finasteride (PROSCAR) 5 MG tablet TAKE 1 TABLET EVERY DAY   Fluticasone-Salmeterol (ADVAIR) 250-50 MCG/DOSE AEPB Inhale 1 puff into the lungs 2 (two) times daily.   furosemide (LASIX) 40 MG tablet TAKE 3 TABLETS BY MOUTH EVERY MORNING AND 2  TABLETS EVERY AFTERNOON. (DOSE INCREASE)   GuaiFENesin (MUCINEX MAXIMUM STRENGTH PO) Take 1 Dose by mouth as needed (drainage).   isosorbide mononitrate (IMDUR) 60 MG 24 hr tablet Take 1 tablet (60 mg total) by mouth daily.   ketoconazole (NIZORAL) 2 % cream Apply 1 application topically daily as needed for irritation.    Multiple Vitamins-Minerals (MULTIVITAMIN PO) Take 1 tablet by mouth daily.   nitroGLYCERIN (NITROLINGUAL) 0.4 MG/SPRAY spray Place 1 spray under the tongue as directed.   pantoprazole (PROTONIX) 40 MG tablet TAKE 1 TABLET EVERY DAY   polyethylene glycol powder (GLYCOLAX/MIRALAX) 17 GM/SCOOP powder Take 17 g by mouth daily as needed (constipation).   potassium chloride (KLOR-CON) 10 MEQ tablet TAKE 1 TABLET EVERY DAY   Protein (UNJURY UNFLAVORED PO) Take 8-16 oz by mouth daily.   repaglinide (PRANDIN) 2 MG tablet Take 1 tablet (2 mg total) by mouth 3 (three) times daily before meals.   rosuvastatin (CRESTOR) 10 MG tablet TAKE 1 TABLET EVERY DAY   Tiotropium Bromide Monohydrate (SPIRIVA RESPIMAT) 2.5 MCG/ACT AERS Inhale 1 puff into the lungs daily.   traZODone (DESYREL) 100 MG tablet TAKE 1 TABLET AT BEDTIME   triamcinolone (KENALOG) 0.025 % cream Apply 1 application topically 3 (three) times daily as needed (skin).   No facility-administered encounter medications on file as of 06/09/2021.   Star Rating Drugs: 11/26/2020 Rosuvastatin 10 mg last filled for 90-Day supply  52/77/8242 Trulicity 4.5  mg last filled for a 28- Day Supply  at [place] (PAP) Wilder Glade 5 mg covered under patient assistance through 2022.  Recent Relevant Labs: Lab Results  Component Value Date/Time   HGBA1C 6.3 (A) 01/24/2021 01:55 PM   HGBA1C 6.2 (A) 10/12/2020 01:46 PM   HGBA1C 7.1 12/29/2019 12:00 AM   HGBA1C 6.9 (H) 05/11/2015 01:56 PM   HGBA1C 7.0 (H) 02/10/2015 09:33 AM   MICROALBUR 38.0 (H) 02/10/2015 09:33 AM   MICROALBUR 29.8 (H) 02/10/2014 03:00 PM    Kidney Function Lab  Results  Component Value Date/Time   CREATININE 1.44 03/02/2020 04:39 PM   CREATININE 1.6 (A) 12/29/2019 12:00 AM   CREATININE 1.66 (H) 08/29/2018 02:34 PM   CREATININE 1.35 05/10/2014 01:46 PM   GFR 47.47 (L) 03/02/2020 04:39 PM   GFRNONAA 51 (L) 08/20/2017 03:42 PM   GFRNONAA 52 (L) 05/10/2014 01:46 PM   GFRAA 59 (L) 08/20/2017 03:42 PM   GFRAA 60 05/10/2014 01:46 PM    Current antihyperglycemic regimen:  Trulicity 4.5 mg as directed once a week Farxiga 5 mg take 2.5 mg daily Insulin Aspart  9 units 3 to 4 times daily (Per Patient)  What recent interventions/DTPs have been made to improve glycemic control:  Stop Repaglinide due to symptoms of dizziness,weakness and muscle pain.Started Insulin Aspart 9 units 3 to 4 times daily.   Have there been any recent hospitalizations or ED visits since last visit with CPP? No  Patient denies hypoglycemic symptoms, including Pale, Sweaty, Shaky, Hungry, Nervous/irritable, and Vision changes Patient denies hyperglycemic symptoms, including blurry vision, excessive thirst, fatigue, polyuria, and weakness How often are you checking your blood sugar? 3-4 times daily What are your blood sugars ranging?  Fasting: N/A Before meals:  On 06/09/2021 it was 174. On 06/08/2021 it was [phone]. On 06/07/2021 it was [phone]. On 06/06/2021 it was [phone]. On 06/05/2021 it was [phone]. After meals: N/A Bedtime: N/A During the week, how often does your blood glucose drop below 70? Never Are you checking your feet daily/regularly?   Patient denies pain, numbness,tingling sensation in his feet.   Adherence Review: Is the patient currently on a STATIN medication? Yes Is the patient currently on ACE/ARB medication? No Does the patient have >5 day gap between last estimated fill dates? Yes  Per Note on 04/26/2021 ,Rosuvastatin 10 mg last filled on 11/26/2020 for 90 day supply at [facility].   Patient denies side effects  from Rosuvastatin.Patient states he takes Rosuvastatin as prescribe.Patient states he only get his Rosuvastatin from [facility]. I reached out to [facility] to confirm the last filled date.Per [place] patient last filled Rosuvastatin 10 mg on 04/21/2021 for 90 day supply  Patient is schedule for a telephone appointment with Clinical Pharmacist on 07/11/2021.  [place] Pharmacist Assistant [phone]

## 2021-06-14 ENCOUNTER — Telehealth: Payer: Self-pay

## 2021-06-14 MED ORDER — ISOSORBIDE MONONITRATE ER 60 MG PO TB24: 60.0000 mg | ORAL_TABLET | Freq: Every day | ORAL | 0 refills | Status: DC

## 2021-06-14 NOTE — Telephone Encounter (Signed)
Last OV 03/15/21 Last fill 07/06/20 #90/3

## 2021-06-21 DIAGNOSIS — L089 Local infection of the skin and subcutaneous tissue, unspecified: Secondary | ICD-10-CM | POA: Diagnosis not present

## 2021-06-28 ENCOUNTER — Telehealth: Payer: Self-pay | Admitting: Family Medicine

## 2021-07-10 ENCOUNTER — Telehealth: Payer: Self-pay

## 2021-07-10 NOTE — Progress Notes (Signed)
Spoke to patient to confirmed patient telephone appointment on 07/11/2021 for CCM at 11:15 am with Junius Argyle the Clinical pharmacist.    Patient Verbalized understanding. Patient aware to have all medications, supplements, blood pressure and blood sugar logs to visit.  Questions: Have you had any recent office visit or specialist visit outside of [name]? No Are there any concerns you would like to discuss during your office visit?No Are you having any problems obtaining your medications? No If patient has any PAP medications ask if they are having any problems getting their PAP medication or refill?No  Care Gaps: Hepatitis C Screening Urine Microalbumin COVID-19 Vaccine Star Rating Drug: 11/26/2020 Rosuvastatin 10 mg last filled for 90-Day supply 38/25/0539 Trulicity 4.5 mg last filled for a 28- Day Supply  at [facility] (PAP) Wilder Glade 5 mg covered under patient assistance through 2022.  Any gaps in medications fill history Citalopram 10 mg last filled 11/28/2020 90 day supply Allopurinol 300 mg last filled 11/28/2020 for 90 day supply. Calcitriol 0.25 last filled 11/26/2020 for 90 day supply. Isosorbide mononitrate  60 mg last filled 01/01/2021 for 90 day supply Potassium  10 MEQ last filled 12/11/2020 for 90 day supply Repaglinide 2 mg not on fill history Trazodone 100 mg last filled 01/01/2021 90 day supply. Pantoprazole last filled 11/26/2020 for 90 day supply Finasteride 5 mg last filled 12/11/2020 for 90 day supply. Clopidogrel 75 mg last filled 12/11/2020 for 90 day supply. Furosemide 40 mg  last filled 11/26/2020 for 90 day supply Carvedilol  25 mglast filled 12/26/2020 for 90 day supply.  [place] Pharmacist Assistant [phone]

## 2021-07-11 ENCOUNTER — Telehealth: Payer: Medicare Other

## 2021-07-11 NOTE — Progress Notes (Deleted)
Chronic Care Management Pharmacy Note  07/11/2021 Name:  [name]. MRN:  [id number] DOB:  1941/07/23  Subjective: [name]. is an 80 y.o. year old male who is a primary patient of [name], DO.  The CCM team was consulted for assistance with disease management and care coordination needs.    Engaged with patient by telephone for follow up visit in response to provider referral for pharmacy case management and/or care coordination services.   Consent to Services:  The patient was given information about Chronic Care Management services, agreed to services, and gave verbal consent prior to initiation of services.  Please see initial visit note for detailed documentation.   Patient Care Team: [name], DO as PCP - General (Family Medicine) Burnell Blanks, MD as PCP - Cardiology (Cardiology) Burnell Blanks, MD as Consulting Physician (Cardiology) Clance, Armando Reichert, MD as Consulting Physician (Pulmonary Disease) Estanislado Emms, MD (Inactive) as Consulting Physician (Nephrology) Sharyne Peach, MD as Attending Physician (Ophthalmology) Bensimhon, Shaune Pascal, MD as Attending Physician (Cardiology) Jari Pigg, MD as Attending Physician (Dermatology) Germaine Pomfret, [facility] as Pharmacist (Pharmacist) [name], MD as Consulting Physician (Endocrinology)  Recent office visits: 04/04/21: Patient presented to Caroleen Hamman, LPN for AWV.  5/73/22: Patient presented to Dr. Bryan Lemma for impacted cerumen. Spiriva stopped (change in therapy).  06/07/20: Patient presented to Wilfred Lacy, NP for follow-up. Patient with pain in jaw line. Patient started on Augmentin x 7 days. Patient referred to ENT.  Recent consult visits: 06/15/21: Patient presented to Dr. Ronnald Ramp  05/07/21: Patient did not tolerate repaglinide, restarted insulin.  04/25/21: Patient presented to Dr. Loanne Drilling (Endocrinology) for follow-up. Novolog stopped, Repaglinide 1 mg TID  started.  04/17/21: Patient presented to Dr. Lamonte Sakai (Pulmonology) for follow-up. Tolerating Wixela + Spiriva.  01/26/21: Patient presented to Dr. Angelena Form (Cardiology) for follow-up. No medication changes made.   Hospital visits: None in previous 6 months  Objective:  Lab Results  Component Value Date   CREATININE 1.44 03/02/2020   BUN 34 (H) 03/02/2020   GFR 47.47 (L) 03/02/2020   GFRNONAA 51 (L) 08/20/2017   GFRAA 59 (L) 08/20/2017   NA 135 03/02/2020   K 4.2 03/02/2020   CALCIUM 9.3 03/02/2020   CO2 33 (H) 03/02/2020   GLUCOSE 207 (H) 03/02/2020    Lab Results  Component Value Date/Time   HGBA1C 6.3 (A) 01/24/2021 01:55 PM   HGBA1C 6.2 (A) 10/12/2020 01:46 PM   HGBA1C 7.1 12/29/2019 12:00 AM   HGBA1C 6.9 (H) 05/11/2015 01:56 PM   HGBA1C 7.0 (H) 02/10/2015 09:33 AM   FRUCTOSAMINE 268 08/24/2016 01:50 PM   GFR 47.47 (L) 03/02/2020 04:39 PM   GFR 42.99 (L) 08/29/2018 02:34 PM   MICROALBUR 38.0 (H) 02/10/2015 09:33 AM   MICROALBUR 29.8 (H) 02/10/2014 03:00 PM    Last diabetic Eye exam:  Lab Results  Component Value Date/Time   HMDIABEYEEXA No Retinopathy 08/09/2020 12:00 AM    Last diabetic Foot exam: No results found for: HMDIABFOOTEX   Lab Results  Component Value Date   CHOL 134 07/20/2020   HDL 47.00 07/20/2020   LDLCALC 70 07/20/2020   TRIG 84.0 07/20/2020   CHOLHDL 3 07/20/2020    Hepatic Function Latest Ref Rng & Units 12/29/2019 08/29/2018 07/31/2017  Total Protein 6.0 - 8.3 g/dL - 5.8(L) 5.5(L)  Albumin 3.5 - 5.0 3.0(A) 3.4(L) 3.2(L)  AST 14 - 40 8(A) 12 15  ALT 10 - 40 21 13 20  Alk Phosphatase 39 - 117 U/L - 69 78  Total Bilirubin 0.2 - 1.2 mg/dL - 0.4 0.3  Bilirubin, Direct 0.0 - 0.3 mg/dL - 0.1 0.11    Lab Results  Component Value Date/Time   TSH 2.38 12/29/2019 12:00 AM   TSH 2.10 08/29/2018 02:34 PM   TSH 2.12 04/24/2017 01:35 PM    CBC Latest Ref Rng & Units 12/29/2019 08/29/2018 07/31/2017  WBC - 8.3 8.8 12.3(H)  Hemoglobin 13.5 - 17.5 12.6(A)  12.3(L) 12.0(L)  Hematocrit 41 - 53 40(A) 36.8(L) 34.4(L)  Platelets 150 - 399 203 256.0 279    Lab Results  Component Value Date/Time   VD25OH 35.80 08/29/2018 02:34 PM   VD25OH 26.40 (L) 04/24/2017 01:35 PM    Clinical ASCVD: Yes  The ASCVD Risk score Mikey Bussing DC Jr., et al., 2013) failed to calculate for the following reasons:   The patient has a prior MI or stroke diagnosis    Depression screen [facility] 2/9 04/04/2021 03/16/2020 10/09/2018  Decreased Interest 0 0 0  Down, Depressed, Hopeless 0 0 0  PHQ - 2 Score 0 0 0  Some recent data might be hidden    Social History   Tobacco Use  Smoking Status Former   Packs/day: 2.00   Years: 41.00   Pack years: 82.00   Types: Cigarettes   Quit date: 12/24/1997   Years since quitting: 23.5  Smokeless Tobacco Never   BP Readings from Last 3 Encounters:  04/25/21 110/70  04/17/21 114/68  04/04/21 138/70   Pulse Readings from Last 3 Encounters:  04/25/21 89  04/17/21 94  04/04/21 78   Wt Readings from Last 3 Encounters:  04/25/21 210 lb 3.2 oz (95.3 kg)  04/17/21 211 lb (95.7 kg)  04/04/21 211 lb 6.4 oz (95.9 kg)   BMI Readings from Last 3 Encounters:  04/25/21 37.24 kg/m  04/17/21 37.38 kg/m  04/04/21 37.45 kg/m    Assessment/Interventions: Review of patient past medical history, allergies, medications, health status, including review of consultants reports, laboratory and other test data, was performed as part of comprehensive evaluation and provision of chronic care management services.   SDOH:  (Social Determinants of Health) assessments and interventions performed: Yes   SDOH Screenings   Alcohol Screen: Low Risk    Last Alcohol Screening Score (AUDIT): 0  Depression (PHQ2-9): Low Risk    PHQ-2 Score: 0  Financial Resource Strain: Low Risk    Difficulty of Paying Living Expenses: Not hard at all  Food Insecurity: No Food Insecurity   Worried About Charity fundraiser in the Last Year: Never true   Ran Out of  Food in the Last Year: Never true  Housing: Low Risk    Last Housing Risk Score: 0  Physical Activity: Inactive   Days of Exercise per Week: 0 days   Minutes of Exercise per Session: 0 min  Social Connections: Moderately Isolated   Frequency of Communication with Friends and Family: More than three times a week   Frequency of Social Gatherings with Friends and Family: More than three times a week   Attends Religious Services: Never   Marine scientist or Organizations: No   Attends Music therapist: Never   Marital Status: Married  Stress: No Stress Concern Present   Feeling of Stress : Not at all  Tobacco Use: Medium Risk   Smoking Tobacco Use: Former   Smokeless Tobacco Use: Never  Transportation Needs: No Transportation Needs   Lack of  Transportation (Medical): No   Lack of Transportation (Non-Medical): No    CCM Care Plan  Allergies  Allergen Reactions   Enalapril Maleate Cough    REACTION: cough   Lisinopril Cough   Shellfish-Derived Products Swelling    Said occurred twice; has eaten some since and had no reactions    Medications Reviewed Today     Reviewed by Casandra Doffing, CMA (Certified Medical Assistant) on 04/25/21 at 1350  Med List Status: <None>   Medication Order Taking? Sig Documenting Provider Last Dose Status Informant  albuterol (VENTOLIN HFA) 108 (90 Base) MCG/ACT inhaler [id number] Yes Inhale 2 puffs into the lungs every 6 (six) hours as needed for wheezing or shortness of breath. Parrett, Fonnie Mu, NP Taking Active   allopurinol (ZYLOPRIM) 300 MG tablet [id number] Yes TAKE 1 TABLET(300 MG) BY MOUTH DAILY [name], DO Taking Active   AMBULATORY NON FORMULARY MEDICATION [id number] Yes O2 @ 2LMP @ night [provider] Taking Active Self  aspirin 81 MG tablet [id number] Yes Take 81 mg by mouth daily. [provider] Taking Active Self  Azelastine HCl 137 MCG/SPRAY SOLN [id number] Yes INSTILL 2 SPRAYS INTO BOTH  NOSTRILS TWICE DAILY AS NEEDED FOR RHINITIS. Collene Gobble, MD Taking Active   B Complex Vitamins (VITAMIN B COMPLEX PO) [id number] Yes Take 1 tablet by mouth daily. [provider] Taking Active   calcitRIOL (ROCALTROL) 0.25 MCG capsule [id number] Yes Take 1 capsule (0.25 mcg total) by mouth daily. [name], DO Taking Active   carvedilol (COREG) 25 MG tablet [id number] Yes TAKE 1 TABLET TWICE DAILY WITH MEALS Burnell Blanks, MD Taking Active   cetirizine (ZYRTEC) 10 MG tablet [id number] Yes Take 10 mg by mouth as needed for allergies.  [provider] Taking Active Self  cholecalciferol (VITAMIN D3) 25 MCG (1000 UNIT) tablet [id number] Yes Take 1,000 Units by mouth daily. [provider] Taking Active   citalopram (CELEXA) 10 MG tablet [id number] Yes TAKE 1 TABLET(10 MG) BY MOUTH DAILY [name], DO Taking Active   clopidogrel (PLAVIX) 75 MG tablet [id number] Yes TAKE 1 TABLET EVERY DAY Burnell Blanks, MD Taking Active   dapagliflozin propanediol (FARXIGA) 5 MG TABS tablet [id number] Yes Take 2.5 mg by mouth daily. [provider] Taking Active   diphenhydrAMINE (BENADRYL) 25 MG tablet [id number] Yes Take 25 mg by mouth at bedtime. [provider] Taking Active   Dulaglutide (TRULICITY) 4.5 CX/4.4YJ SOPN [id number] Yes Inject 4.5 mg as directed once a week. [name], MD Taking Active   finasteride (PROSCAR) 5 MG tablet [id number] Yes TAKE 1 TABLET EVERY DAY Cirigliano, Garvin Fila, DO Taking Active   Fluticasone-Salmeterol (ADVAIR) 250-50 MCG/DOSE AEPB [id number] Yes Inhale 1 puff into the lungs 2 (two) times daily. [provider] Taking Active   furosemide (LASIX) 40 MG tablet [id number] Yes TAKE 3 TABLETS BY MOUTH EVERY MORNING AND 2 TABLETS EVERY AFTERNOON. (DOSE INCREASE) Burnell Blanks, MD Taking Active   GuaiFENesin [facility] MAXIMUM STRENGTH PO) [id number] Yes Take 1 Dose by mouth as needed (drainage).  [provider] Taking Active   insulin aspart (NOVOLOG) 100 UNIT/ML injection [id number] Yes Inject 5 Units into the skin 3 (three) times daily with meals. [name], MD Taking Active   isosorbide mononitrate (IMDUR) 60 MG 24 hr tablet [id number] Yes Take 1 tablet (60 mg total) by mouth daily. [name], DO Taking Active   ketoconazole (NIZORAL) 2 % cream [id number] Yes Apply 1 application  topically daily as needed for irritation.  [provider] Taking Active Self  Multiple Vitamins-Minerals (MULTIVITAMIN PO) [id number] Yes Take 1 tablet by mouth daily. [provider] Taking Active Self  nitroGLYCERIN (NITROLINGUAL) 0.4 MG/SPRAY spray [id number] Yes Place 1 spray under the tongue as directed. Burnell Blanks, MD Taking Active Self  pantoprazole (PROTONIX) 40 MG tablet [id number] Yes TAKE 1 TABLET EVERY DAY Cirigliano, Mary K, DO Taking Active   polyethylene glycol powder (GLYCOLAX/MIRALAX) 17 GM/SCOOP powder [id number] Yes Take 17 g by mouth daily as needed (constipation). [provider] Taking Active Self  potassium chloride (KLOR-CON) 10 MEQ tablet [id number] Yes Take 1 tablet (10 mEq total) by mouth daily. [name], DO Taking Active   Protein (UNJURY UNFLAVORED PO) [id number] Yes Take 8-16 oz by mouth daily. [provider] Taking Active Self  rosuvastatin (CRESTOR) 10 MG tablet [id number] Yes TAKE 1 TABLET EVERY DAY Cirigliano, Mary K, DO Taking Active   Tiotropium Bromide Monohydrate (SPIRIVA RESPIMAT) 2.5 MCG/ACT AERS [id number] Yes Inhale 1 puff into the lungs daily. [provider] Taking Active   traZODone (DESYREL) 100 MG tablet [id number] Yes TAKE 1 TABLET AT BEDTIME Cirigliano, Garvin Fila, DO Taking Active   triamcinolone (KENALOG) 0.025 % cream [id number] Yes Apply 1 application topically 3 (three) times daily as needed (skin). [provider] Taking Active Self            Patient Active Problem List    Diagnosis Date Noted   Acute bronchitis with COPD ([place]) 03/26/2018   Renal failure 08/30/2017   Abnormal x-ray of lung 08/14/2017   Hypocalcemia 04/24/2017   Anemia 04/24/2017   Low back pain 12/28/2016   Hemoptysis 10/18/2016   Chronic respiratory failure with hypoxia and hypercapnia (HCC) 09/04/2016   Fatigue 06/13/2016   Diabetes ([place]) 02/23/2016   Jaw pain 10/25/2015   Fever 08/08/2015   CHF exacerbation ([place]) 08/30/2014   COPD (chronic obstructive pulmonary disease) with chronic bronchitis ([place]) 08/30/2014   Obesity (BMI 30-39.9) 08/30/2014   Special screening for malignant neoplasms, colon 07/27/2014   Family history of colon cancer in mother 07/27/2014   Acute combined systolic and diastolic heart failure ([place]) 02/10/2014   Yeast infection 08/12/2013   Difficulty swallowing solids 07/21/2013   NSTEMI (non-ST elevated myocardial infarction) ([place]) 07/17/2013   Sepsis ([place]) 07/16/2013   Elevated troponin 07/16/2013   UTI (urinary tract infection) 02/23/2013   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   Encounter for long-term (current) use of other medications 11/24/2012   Screening for malignant neoplasm of prostate 11/24/2012   DM (diabetes mellitus) ([place]) 11/24/2012   History of laparoscopic adjustable gastric banding 03/20/2012   Depression 12/14/2011   Physical deconditioning 12/14/2011   GERD (gastroesophageal reflux disease) 10/31/2011   Paronychia of great toe of right foot 08/22/2011   Pre-operative cardiovascular examination 03/21/2011   ABDOMINAL PAIN 01/18/2011   OBESITY-MORBID (>100') 06/09/2010   Iron deficiency anemia 05/01/2010   CHEST PAIN-PRECORDIAL 12/26/2009   BASAL CELL CARCINOMA, HX OF 10/31/2009   HYPERTROPHY PROSTATE W/UR OBST & OTH LUTS 07/27/2009   FOOT PAIN 07/27/2009   COUGH DUE TO ACE INHIBITORS 06/14/2009   OTITIS MEDIA, ACUTE, LEFT 03/14/2009   GI BLEEDING 02/16/2009   HYPERTENSION, BENIGN 02/02/2009   CAD, NATIVE VESSEL  06/16/7627   DIASTOLIC HEART FAILURE, CHRONIC 02/02/2009   EDEMA 11/22/2008   CONJUNCTIVITIS, RIGHT 06/18/2008   HYPOKALEMIA 06/03/2008   CHF 06/03/2008   OSTEOPOROSIS 04/16/2008   Essential hypertension 04/01/2008  Gout 03/25/2008   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   PERIPHERAL VASCULAR DISEASE 09/01/2007   Disorder resulting from impaired renal function 09/01/2007   Controlled diabetes mellitus type 2 with complications ([place]) 96/03/5408   Dyslipidemia 05/12/2007   IMPOTENCE 05/12/2007   CVA WITH LEFT HEMIPARESIS 05/12/2007   Allergic rhinitis 05/12/2007   COPD (chronic obstructive pulmonary disease) with emphysema ([place]) 05/12/2007   OSTEOARTHRITIS 05/12/2007   SINUS HEADACHE 05/12/2007    Immunization History  Administered Date(s) Administered   Fluad Quad(high Dose 65+) 08/25/2019, 10/04/2020   Influenza Split 09/14/2011, 09/23/2012, 09/03/2013   Influenza Whole 09/10/2008, 09/23/2009, 08/24/2010   Influenza, High Dose Seasonal PF 09/12/2010, 09/24/2011, 08/24/2012, 08/24/2014, 09/12/2015, 09/24/2015, 09/19/2016, 09/23/2017, 10/28/2017, 08/29/2018   Influenza,inj,Quad PF,6+ Mos 09/13/2014   Influenza-Unspecified 08/26/2009, 09/12/2010, 10/24/2010, 09/09/2019, 09/23/2020   Moderna Sars-Covid-2 Vaccination 01/06/2020, 02/03/2020, 08/24/2020   Pneumococcal Conjugate-13 11/11/2014   Pneumococcal Polysaccharide-23 07/24/1996, 07/17/2013, 01/06/2015   Pneumococcal-Unspecified 09/23/2005   Td 02/22/2004   Tdap 12/24/2013, 11/11/2014   Zoster Recombinat (Shingrix) 12/12/2020, 03/30/2021    Conditions to be addressed/monitored:  Hypertension, Hyperlipidemia, Diabetes, Heart Failure, Coronary Artery Disease, GERD, COPD, Chronic Kidney Disease, Depression, Osteoporosis, Osteoarthritis, BPH, Gout and Allergic Rhinitis  There are no care plans that you recently modified to display for this patient.    Medication Assistance:  [place]ilder Glade obtained through [place]  medication [place].  Enrollment ends Dec 8119 Trulicity obtained through [employer] medication assistance program.  Enrollment ends Dec 2022  Patient's preferred pharmacy is:  [facility] [place], [address] [place] [address] Phone: [phone] Fax: [phone]  [facility] ([facility] - [place], [address] [place] [address] Phone: [phone] Fax: [phone]  Uses pill box? Yes Pt endorses 100% compliance  We discussed: Current pharmacy is preferred with insurance plan and patient is satisfied with pharmacy services Patient decided to: Continue current medication management strategy  Care Plan and Follow Up Patient Decision:  Patient agrees to Care Plan and Follow-up.  Plan: Telephone follow up appointment with care management team member scheduled for:  07/11/2021 at 11:15 AM  Junius Argyle, PharmD, CPP Clinical Pharmacist [facility]  [phone]  Current Barriers:  Unable to independently afford treatment regimen  Pharmacist Clinical Goal(s):  Patient will verbalize ability to afford treatment regimen maintain control of Heart Failure as evidenced by stable weight, lack of exacerbations  through collaboration with PharmD and provider.   Interventions: 1:1 collaboration with [name], DO regarding development and update of comprehensive plan of care as evidenced by provider attestation and co-signature Inter-disciplinary care team collaboration (see longitudinal plan of care) Comprehensive medication review performed; medication list updated in electronic medical record  Heart Failure (Goal: manage symptoms and prevent exacerbations) -Managed by Dr. Angelena Form  -Controlled -Last ejection fraction: 55-60% (Date: Feb 2019) -HF type:  Diastolic -NYHA Class: II (slight limitation of activity) -AHA HF Stage: C (Heart disease and symptoms present) -Current treatment: Carvedilol 25 mg twice daily  Furosemide 40 mg 3 tablets AM, 2 tablets PM  Isosorbide Mononitrate 60 mg daily  -Medications previously tried: NA  -Current home BP/HR readings: NA -Current dietary habits: Limiting salt.   -Current exercise habits: Previously going to [place] on Importance of weighing daily; if you gain more than 3 pounds in one day  or 5 pounds in one week, contact provider's office -Recommended to continue current medication  Hyperlipidemia: (LDL goal < 70) -Controlled -Current treatment: Rosuvastatin 10 mg daily  -Current antiplatelet treatment: Aspirin 81 mg daily  Clopidogrel 75 mg daily  -Medications previously tried: NA  -Educated on Importance of limiting foods high in cholesterol; -Recommended to continue current medication  Diabetes (A1c goal <8%) -Controlled -Current medications: Farxiga 5 mg 1/2 tablet daily  Trulicity 4.5 mg weekly  Novolog 5 untis three times daily with meals  -Medications previously tried: Repaglinide, metformin, actos, Starlix -Current home glucose readings On 02/07/2021 it was [phone]. On 02/06/2021 it was [phone]. On 02/05/2021 it was [phone] -Denies hypoglycemic/hyperglycemic symptoms -Educated on Benefits of routine self-monitoring of blood sugar; Carbohydrate counting and/or plate method -Counseled to check feet daily and get yearly eye exams -Recommended to continue current medication Assessed patient finances. Wilder Glade, Trulicity covered under patient assistance through 2022  COPD (Goal: control symptoms and prevent exacerbations) -Controlled -Current treatment  Ventolin HFA 2 puffs every 6 hours as needed  Spiriva Respimat 1 puff daily  Wixela 1 puff twice daily  -Current allergic rhinitis  treatment  Azelastine Nasal spray 2 spray twice daily as  needed  Cetirizine 10 mg daily  Benadryl 25 mg nightly  Guaifenesin daily as needed - Drainage  -Medications previously tried: NA  -Gold Grade: Gold 3 (FEV1 30-49%) -Current COPD Classification:  B (high sx, <2 exacerbations/yr) -MMRC/CAT score: 10  -Pulmonary function testing: FEV1 39% (2013) -Exacerbations requiring treatment in last 6 months: No -Patient reports consistent use of maintenance inhaler -Frequency of rescue inhaler use: few times monthly -Counseled on Benefits of consistent maintenance inhaler use When to use rescue inhaler -Recommended to continue current medication  Depression/Insomnia (Goal: Maintain stable mood/sleep) -Controlled -Current treatment: Citalopram 10 mg daily  Trazodone 100 mg nightly  -Medications previously tried/failed: NA -PHQ9: 0 -GAD7: NA - A couple of nights a year he has a hard time falling asleep. He attributes this to barimetric changes in the weather. Overall feels he has good sleep quality, sleeps soundly through the night. He tends to sleep from 3a-11a due to caring for his daughter who suffers from [place].  -Educated on Benefits of medication for symptom control  -Counseled on proper sleep hygiene -Recommended to continue current medication  Gout (Goal: Prevent gout flares) -Controlled -Current treatment  Allopurinol 300 mg daily  -Medications previously tried: NA -Last gout flare: 2021  -Counseled patient on low-purine diet -Recommended to continue current medication  Patient Goals/Self-Care Activities Patient will:  - check glucose before meals and at bedtime, document, and provide at future appointments check blood pressure 2-3 times weekly, document, and provide at future appointments weigh daily, and contact provider if weight gain of greater than 3 pounds in one day  Follow Up Plan: Telephone follow up appointment with care management team member scheduled for:  07/11/2021 at 11:15 AM

## 2021-07-12 NOTE — Progress Notes (Signed)
Reschedule patient appointment that was miss on 07/11/2021 to 08/22/2021 at 11:00 am telephone follow up with thew clinical pharmacist.  [place] Pharmacist Assistant [phone]

## 2021-07-13 NOTE — Telephone Encounter (Signed)
[facility] called and said they didn't receive any of these from 06/28/21, I did tell them that it shows we sent them but they said they never got it and asked if we could resend. Please advise

## 2021-07-22 ENCOUNTER — Other Ambulatory Visit: Payer: Self-pay | Admitting: Family Medicine

## 2021-07-26 DIAGNOSIS — D0439 Carcinoma in situ of skin of other parts of face: Secondary | ICD-10-CM | POA: Diagnosis not present

## 2021-07-26 DIAGNOSIS — D044 Carcinoma in situ of skin of scalp and neck: Secondary | ICD-10-CM | POA: Diagnosis not present

## 2021-07-26 DIAGNOSIS — L820 Inflamed seborrheic keratosis: Secondary | ICD-10-CM | POA: Diagnosis not present

## 2021-08-10 DIAGNOSIS — E119 Type 2 diabetes mellitus without complications: Secondary | ICD-10-CM | POA: Diagnosis not present

## 2021-08-10 DIAGNOSIS — H52203 Unspecified astigmatism, bilateral: Secondary | ICD-10-CM | POA: Diagnosis not present

## 2021-08-10 LAB — HM DIABETES EYE EXAM

## 2021-08-14 ENCOUNTER — Encounter: Payer: Self-pay | Admitting: Family Medicine

## 2021-08-21 ENCOUNTER — Telehealth: Payer: Self-pay

## 2021-08-21 NOTE — Progress Notes (Signed)
Spoke to patient to confirmed patient telephone appointment on 08/22/2021 for CCM at 11:00 am with Junius Argyle the Clinical pharmacist.   [place] Pharmacist Assistant [phone]

## 2021-08-22 ENCOUNTER — Ambulatory Visit (INDEPENDENT_AMBULATORY_CARE_PROVIDER_SITE_OTHER): Payer: Medicare Other

## 2021-08-22 DIAGNOSIS — I5032 Chronic diastolic (congestive) heart failure: Secondary | ICD-10-CM

## 2021-08-22 DIAGNOSIS — J449 Chronic obstructive pulmonary disease, unspecified: Secondary | ICD-10-CM | POA: Diagnosis not present

## 2021-08-22 DIAGNOSIS — E1151 Type 2 diabetes mellitus with diabetic peripheral angiopathy without gangrene: Secondary | ICD-10-CM

## 2021-08-22 DIAGNOSIS — Z794 Long term (current) use of insulin: Secondary | ICD-10-CM | POA: Diagnosis not present

## 2021-08-22 MED ORDER — POTASSIUM CHLORIDE ER 10 MEQ PO TBCR: 10.0000 meq | EXTENDED_RELEASE_TABLET | Freq: Every day | ORAL | 0 refills | Status: DC

## 2021-08-22 MED ORDER — ISOSORBIDE MONONITRATE ER 60 MG PO TB24: 60.0000 mg | ORAL_TABLET | Freq: Every day | ORAL | 0 refills | Status: DC

## 2021-08-22 MED ORDER — CALCITRIOL 0.25 MCG PO CAPS: 0.2500 ug | ORAL_CAPSULE | Freq: Every day | ORAL | 0 refills | Status: DC

## 2021-08-22 NOTE — Patient Instructions (Signed)
Visit Information It was great speaking with you today!  Please let me know if you have any questions about our visit.   Goals Addressed             This Visit's Progress    Track and Manage Fluids and Swelling-Heart Failure   On track    Timeframe:  Long-Range Goal Priority:  High Start Date:  04/12/2021                           Expected End Date:  10/12/2022                     Follow Up Date 11/22/2021   - call office if I gain more than 2 pounds in one day or 5 pounds in one week - keep legs up while sitting - use salt in moderation - watch for swelling in feet, ankles and legs every day - weigh myself daily    Why is this important?   It is important to check your weight daily and watch how much salt and liquids you have.  It will help you to manage your heart failure.    Notes:         Patient Care Plan: General Pharmacy (Adult)     Problem Identified: Hypertension, Hyperlipidemia, Diabetes, Heart Failure, Coronary Artery Disease, GERD, COPD, Chronic Kidney Disease, Depression, Osteoporosis, Osteoarthritis, BPH, Gout and Allergic Rhinitis   Priority: High     Long-Range Goal: Patient-Specific Goal   Start Date: 04/12/2021  Expected End Date: 08/22/2022  This Visit's Progress: On track  Recent Progress: On track  Priority: High  Note:   Current Barriers:  Unable to independently afford treatment regimen  Pharmacist Clinical Goal(s):  Patient will verbalize ability to afford treatment regimen maintain control of Heart Failure as evidenced by stable weight, lack of exacerbations  through collaboration with PharmD and provider.   Interventions: 1:1 collaboration with [name], DO regarding development and update of comprehensive plan of care as evidenced by provider attestation and co-signature Inter-disciplinary care team collaboration (see longitudinal plan of care) Comprehensive medication review performed; medication list updated in electronic  medical record  Heart Failure (Goal: manage symptoms and prevent exacerbations) -Managed by Dr. Angelena Form  -Controlled -Last ejection fraction: 55-60% (Date: Feb 2019) -HF type: Diastolic -NYHA Class: II (slight limitation of activity) -AHA HF Stage: B (Heart disease and symptoms present) -Current treatment: Carvedilol 25 mg twice daily  Furosemide 40 mg 3 tablets AM, 2 tablets PM  Isosorbide Mononitrate 60 mg daily  -Medications previously tried: NA  -Current home BP/HR readings: NA -Current dietary habits: Limiting salt.   -Current exercise habits: Previously going to [employer].  -Educated on Importance of weighing daily; if you gain more than 3 pounds in one day or 5 pounds in one week, contact provider's office -Recommended to continue current medication  Hyperlipidemia: (LDL goal < 70) -Controlled -Current treatment: Rosuvastatin 10 mg daily  -Current antiplatelet treatment: Aspirin 81 mg daily  Clopidogrel 75 mg daily  -Medications previously tried: NA  -Educated on Importance of limiting foods high in cholesterol; -Recommended to continue current medication  Diabetes (A1c goal <8%) -Controlled -Current medications: Farxiga 5 mg 1/2 tablet daily Novolog 9 units before meals   Trulicity 4.5 mg weekly  -Medications previously tried: Prandin (hypoglycemia, falls) -Current home glucose readings 66, 57 due to not eating. Typically Range 124-174.  -Reports hypoglycemic symptoms -Educated on Benefits of  routine self-monitoring of blood sugar; Carbohydrate counting and/or plate method -Counseled to check feet daily and get yearly eye exams -Recommended to continue current medication  COPD (Goal: control symptoms and prevent exacerbations) -Controlled -Current treatment  Ventolin HFA 2 puffs every 6 hours as needed  Spiriva Respimat 1 puff daily  Wixela 1 puff twice daily  -Current allergic rhinitis  treatment  Azelastine Nasal spray 2 spray twice daily as needed   Cetirizine 10 mg daily  Benadryl 25 mg nightly  Guaifenesin daily as needed - Drainage  -Medications previously tried: NA  -Gold Grade: Gold 3 (FEV1 30-49%) -Current COPD Classification:  B (high sx, <2 exacerbations/yr) -MMRC/CAT score: 10  -Pulmonary function testing: FEV1 39% (2013) -Exacerbations requiring treatment in last 6 months: No -Patient reports consistent use of maintenance inhaler -Frequency of rescue inhaler use: few times monthly -Counseled on Benefits of consistent maintenance inhaler use When to use rescue inhaler -Recommended to continue current medication  Depression/Insomnia (Goal: Maintain stable mood/sleep) -Controlled -Current treatment: Citalopram 10 mg daily  Trazodone 100 mg nightly  -Medications previously tried/failed: NA -PHQ9: 0 -GAD7: NA - A couple of nights a year he has a hard time falling asleep. He attributes this to barimetric changes in the weather. Overall feels he has good sleep quality, sleeps soundly through the night. He tends to sleep from 3a-11a due to caring for his daughter who suffers from [place].  -Educated on Benefits of medication for symptom control  -Counseled on proper sleep hygiene -Recommended to continue current medication  Gout (Goal: Prevent gout flares) -Controlled -Current treatment  Allopurinol 300 mg daily  -Medications previously tried: NA -Last gout flare: 2021  -Counseled patient on low-purine diet -Recommended to continue current medication  Patient Goals/Self-Care Activities Patient will:  - check glucose before meals and at bedtime, document, and provide at future appointments check blood pressure 2-3 times weekly, document, and provide at future appointments weigh daily, and contact provider if weight gain of greater than 3 pounds in one day  Follow Up Plan: Telephone follow up appointment with care management team member scheduled for:  02/26/2022 at 3:00 PM     Patient agreed to services and verbal  consent obtained.   Patient verbalizes understanding of instructions provided today and agrees to view in [place].   Junius Argyle, PharmD, [place], CPP Clinical Pharmacist [facility]  [phone]

## 2021-08-22 NOTE — Progress Notes (Signed)
Chronic Care Management Pharmacy Note  08/22/2021 Name:  [name]. MRN:  [id number] DOB:  Sep 27, 1941  Summary: Patient presents for CCM follow-up. He did not tolerate Prandin and resumed his insulin injections. He reports a few instances of significant hypoglycemia due to mistiming his insulin administration or overdosing himself. He continues to have shortness of breath with exertion, but feels it is stable.   He reports a few medications are due for refill prior to follow-up with Dr. Gena Fray.   Recommendations/Changes made from today's visit: Continue current medications Reapply for PAP in October 90-day courtesy refills sent into pharmacy  Plan: CPP follow-up 6 months  Subjective: [name] Withem. is an 80 y.o. year old male who is a primary patient of No primary care provider on file..  The CCM team was consulted for assistance with disease management and care coordination needs.    Engaged with patient by telephone for follow up visit in response to provider referral for pharmacy case management and/or care coordination services.   Consent to Services:  The patient was given information about Chronic Care Management services, agreed to services, and gave verbal consent prior to initiation of services.  Please see initial visit note for detailed documentation.   Patient Care Team: Burnell Blanks, MD as PCP - Cardiology (Cardiology) Burnell Blanks, MD as Consulting Physician (Cardiology) Clance, Armando Reichert, MD as Consulting Physician (Pulmonary Disease) Estanislado Emms, MD (Inactive) as Consulting Physician (Nephrology) Sharyne Peach, MD as Attending Physician (Ophthalmology) Bensimhon, Shaune Pascal, MD as Attending Physician (Cardiology) Jari Pigg, MD as Attending Physician (Dermatology) Germaine Pomfret, [facility] as Pharmacist (Pharmacist) [name], MD as Consulting Physician (Endocrinology)  Recent office visits: 04/04/21: Patient presented to  Caroleen Hamman, LPN for AWV.  1/60/73: Patient presented to Dr. Bryan Lemma for impacted cerumen. Spiriva stopped (change in therapy).  06/07/20: Patient presented to Wilfred Lacy, NP for follow-up. Patient with pain in jaw line. Patient started on Augmentin x 7 days. Patient referred to ENT.  Recent consult visits: 04/25/21: Patient presented to Dr. Loanne Drilling (Endocrinology). Novolog stopped. Repaglinide 1 mg TID started.  04/17/21: Patient presented to Dr. Lamonte Sakai (Pulmonology) for follow-up. No medication changes made. 01/26/21: Patient presented to Dr. Angelena Form (Cardiology) for follow-up. No medication changes made.   Hospital visits: None in previous 6 months  Objective:  Lab Results  Component Value Date   CREATININE 1.44 03/02/2020   BUN 34 (H) 03/02/2020   GFR 47.47 (L) 03/02/2020   GFRNONAA 51 (L) 08/20/2017   GFRAA 59 (L) 08/20/2017   NA 135 03/02/2020   K 4.2 03/02/2020   CALCIUM 9.3 03/02/2020   CO2 33 (H) 03/02/2020   GLUCOSE 207 (H) 03/02/2020    Lab Results  Component Value Date/Time   HGBA1C 6.3 (A) 01/24/2021 01:55 PM   HGBA1C 6.2 (A) 10/12/2020 01:46 PM   HGBA1C 7.1 12/29/2019 12:00 AM   HGBA1C 6.9 (H) 05/11/2015 01:56 PM   HGBA1C 7.0 (H) 02/10/2015 09:33 AM   FRUCTOSAMINE 268 08/24/2016 01:50 PM   GFR 47.47 (L) 03/02/2020 04:39 PM   GFR 42.99 (L) 08/29/2018 02:34 PM   MICROALBUR 38.0 (H) 02/10/2015 09:33 AM   MICROALBUR 29.8 (H) 02/10/2014 03:00 PM    Last diabetic Eye exam:  Lab Results  Component Value Date/Time   HMDIABEYEEXA No Retinopathy 08/10/2021 12:00 AM    Last diabetic Foot exam: No results found for: HMDIABFOOTEX   Lab Results  Component Value Date   CHOL 134 07/20/2020   HDL  47.00 07/20/2020   LDLCALC 70 07/20/2020   TRIG 84.0 07/20/2020   CHOLHDL 3 07/20/2020    Hepatic Function Latest Ref Rng & Units 12/29/2019 08/29/2018 07/31/2017  Total Protein 6.0 - 8.3 g/dL - 5.8(L) 5.5(L)  Albumin 3.5 - 5.0 3.0(A) 3.4(L) 3.2(L)  AST 14 - 40 8(A) 12  15  ALT 10 - 40 _0 Alk Phosphatase 39 - 117 U/L - 69 78  Total Bilirubin 0.2 - 1.2 mg/dL - 0.4 0.3  Bilirubin, Direct 0.0 - 0.3 mg/dL - 0.1 0.11    Lab Results  Component Value Date/Time   TSH 2.38 12/29/2019 12:00 AM   TSH 2.10 08/29/2018 02:34 PM   TSH 2.12 04/24/2017 01:35 PM    CBC Latest Ref Rng & Units 12/29/2019 08/29/2018 07/31/2017  WBC - 8.3 8.8 12.3(H)  Hemoglobin 13.5 - 17.5 12.6(A) 12.3(L) 12.0(L)  Hematocrit 41 - 53 40(A) 36.8(L) 34.4(L)  Platelets 150 - 399 203 256.0 279    Lab Results  Component Value Date/Time   VD25OH 35.80 08/29/2018 02:34 PM   VD25OH 26.40 (L) 04/24/2017 01:35 PM    Clinical ASCVD: Yes  The ASCVD Risk score Mikey Bussing DC Jr., et al., 2013) failed to calculate for the following reasons:   The patient has a prior MI or stroke diagnosis    Depression screen [facility] 2/9 04/04/2021 03/16/2020 10/09/2018  Decreased Interest 0 0 0  Down, Depressed, Hopeless 0 0 0  PHQ - 2 Score 0 0 0  Some recent data might be hidden    Social History   Tobacco Use  Smoking Status Former   Packs/day: 2.00   Years: 41.00   Pack years: 82.00   Types: Cigarettes   Quit date: 12/24/1997   Years since quitting: 23.6  Smokeless Tobacco Never   BP Readings from Last 3 Encounters:  04/25/21 110/70  04/17/21 114/68  04/04/21 138/70   Pulse Readings from Last 3 Encounters:  04/25/21 89  04/17/21 94  04/04/21 78   Wt Readings from Last 3 Encounters:  04/25/21 210 lb 3.2 oz (95.3 kg)  04/17/21 211 lb (95.7 kg)  04/04/21 211 lb 6.4 oz (95.9 kg)   BMI Readings from Last 3 Encounters:  04/25/21 37.24 kg/m  04/17/21 37.38 kg/m  04/04/21 37.45 kg/m    Assessment/Interventions: Review of patient past medical history, allergies, medications, health status, including review of consultants reports, laboratory and other test data, was performed as part of comprehensive evaluation and provision of chronic care management services.   SDOH:  (Social Determinants of  Health) assessments and interventions performed: Yes SDOH Interventions    Flowsheet Row Most Recent Value  SDOH Interventions   Financial Strain Interventions Intervention Not Indicated       SDOH Screenings   Alcohol Screen: Low Risk    Last Alcohol Screening Score (AUDIT): 0  Depression (PHQ2-9): Low Risk    PHQ-2 Score: 0  Financial Resource Strain: Low Risk    Difficulty of Paying Living Expenses: Not hard at all  Food Insecurity: No Food Insecurity   Worried About Charity fundraiser in the Last Year: Never true   Ran Out of Food in the Last Year: Never true  Housing: Low Risk    Last Housing Risk Score: 0  Physical Activity: Inactive   Days of Exercise per Week: 0 days   Minutes of Exercise per Session: 0 min  Social Connections: Moderately Isolated   Frequency of Communication with Friends and Family: More than  three times a week   Frequency of Social Gatherings with Friends and Family: More than three times a week   Attends Religious Services: Never   Marine scientist or Organizations: No   Attends Music therapist: Never   Marital Status: Married  Stress: No Stress Concern Present   Feeling of Stress : Not at all  Tobacco Use: Medium Risk   Smoking Tobacco Use: Former   Smokeless Tobacco Use: Never  Transportation Needs: No Data processing manager (Medical): No   Lack of Transportation (Non-Medical): No    CCM Care Plan  Allergies  Allergen Reactions   Enalapril Maleate Cough    REACTION: cough   Lisinopril Cough   Shellfish-Derived Products Swelling    Said occurred twice; has eaten some since and had no reactions    Medications Reviewed Today     Reviewed by Germaine Pomfret, [place] (Pharmacist) on 08/22/21 at 1139  Med List Status: <None>   Medication Order Taking? Sig Documenting Provider Last Dose Status Informant  albuterol (VENTOLIN HFA) 108 (90 Base) MCG/ACT inhaler [id number]  Inhale 2 puffs into the  lungs every 6 (six) hours as needed for wheezing or shortness of breath. Parrett, Fonnie Mu, NP  Active   allopurinol (ZYLOPRIM) 300 MG tablet [id number]  TAKE 1 TABLET EVERY DAY [name], DO  Active   AMBULATORY NON FORMULARY MEDICATION [id number]  O2 @ 2LMP @ night [provider]  Active Self  aspirin 81 MG tablet [id number]  Take 81 mg by mouth daily. [provider]  Active Self  Azelastine HCl 137 MCG/SPRAY SOLN [id number]  INSTILL 2 SPRAYS INTO BOTH NOSTRILS TWICE DAILY AS NEEDED FOR RHINITIS. Collene Gobble, MD  Active   B Complex Vitamins (VITAMIN B COMPLEX PO) [id number]  Take 1 tablet by mouth daily. [provider]  Active   calcitRIOL (ROCALTROL) 0.25 MCG capsule [id number]  TAKE 1 CAPSULE EVERY DAY Cirigliano, Mary Raliegh Ip, DO  Active   carvedilol (COREG) 25 MG tablet [id number]  TAKE 1 TABLET TWICE DAILY WITH MEALS McAlhany, Annita Brod, MD  Active   cetirizine (ZYRTEC) 10 MG tablet [id number]  Take 10 mg by mouth as needed for allergies.  [provider]  Active Self  cholecalciferol (VITAMIN D3) 25 MCG (1000 UNIT) tablet [id number]  Take 1,000 Units by mouth daily. [provider]  Active   citalopram (CELEXA) 10 MG tablet [id number]  TAKE 1 TABLET EVERY DAY [name], DO  Active   clopidogrel (PLAVIX) 75 MG tablet [id number]  TAKE 1 TABLET EVERY DAY Burnell Blanks, MD  Active   dapagliflozin propanediol (FARXIGA) 5 MG TABS tablet [id number]  Take 2.5 mg by mouth daily. [provider]  Active   diphenhydrAMINE (BENADRYL) 25 MG tablet [id number]  Take 25 mg by mouth at bedtime. [provider]  Active   Dulaglutide (TRULICITY) 4.5 OI/7.1IW SOPN [id number]  Inject 4.5 mg as directed once a week. [name], MD  Active   finasteride (PROSCAR) 5 MG tablet [id number]  TAKE 1 TABLET EVERY DAY [name], DO  Active   Fluticasone-Salmeterol (ADVAIR) 250-50 MCG/DOSE AEPB [id number]  Inhale 1 puff into the lungs  2 (two) times daily. [provider]  Active   furosemide (LASIX) 40 MG tablet [id number]  TAKE 3 TABLETS BY MOUTH EVERY MORNING AND 2 TABLETS EVERY AFTERNOON. (DOSE INCREASE) Burnell Blanks, MD  Active   GuaiFENesin ([place]) [id number]  Take 1 Dose by mouth as needed (drainage). [provider]  Active   insulin aspart (NOVOLOG) 100 UNIT/ML injection [id number] Yes Inject 9 Units into the skin 3 (three) times daily before meals. [provider]  Active   isosorbide mononitrate (IMDUR) 60 MG 24 hr tablet [id number]  TAKE 1 TABLET EVERY DAY Cirigliano, Mary K, DO  Active   ketoconazole (NIZORAL) 2 % cream [id number]  Apply 1 application topically daily as needed for irritation.  [provider]  Active Self  Multiple Vitamins-Minerals (MULTIVITAMIN PO) [id number]  Take 1 tablet by mouth daily. [provider]  Active Self  nitroGLYCERIN (NITROLINGUAL) 0.4 MG/SPRAY spray [id number]  Place 1 spray under the tongue as directed. Burnell Blanks, MD  Active Self  pantoprazole (PROTONIX) 40 MG tablet [id number]  TAKE 1 TABLET EVERY DAY Cirigliano, Mary K, DO  Active   polyethylene glycol powder (GLYCOLAX/MIRALAX) 17 GM/SCOOP powder [id number]  Take 17 g by mouth daily as needed (constipation). [provider]  Active Self  potassium chloride (KLOR-CON) 10 MEQ tablet [id number]  TAKE 1 TABLET EVERY DAY Cirigliano, Mary Raliegh Ip, DO  Active   Protein (UNJURY UNFLAVORED PO) [id number]  Take 8-16 oz by mouth daily. [provider]  Active Self  rosuvastatin (CRESTOR) 10 MG tablet [id number]  TAKE 1 TABLET EVERY DAY Cirigliano, Mary K, DO  Active   Tiotropium Bromide Monohydrate (SPIRIVA RESPIMAT) 2.5 MCG/ACT AERS [id number]  Inhale 1 puff into the lungs daily. [provider]  Active   traZODone (DESYREL) 100 MG tablet [id number]  TAKE 1 TABLET AT BEDTIME Cirigliano, Mary K, DO  Active   triamcinolone (KENALOG) 0.025 % cream  [id number]  Apply 1 application topically 3 (three) times daily as needed (skin). [provider]  Active Self            Patient Active Problem List   Diagnosis Date Noted   Acute bronchitis with COPD ([place]) 03/26/2018   Renal failure 08/30/2017   Abnormal x-ray of lung 08/14/2017   Hypocalcemia 04/24/2017   Anemia 04/24/2017   Low back pain 12/28/2016   Hemoptysis 10/18/2016   Chronic respiratory failure with hypoxia and hypercapnia (HCC) 09/04/2016   Fatigue 06/13/2016   Diabetes ([place]) 02/23/2016   Jaw pain 10/25/2015   Fever 08/08/2015   CHF exacerbation ([place]) 08/30/2014   COPD (chronic obstructive pulmonary disease) with chronic bronchitis ([place]) 08/30/2014   Obesity (BMI 30-39.9) 08/30/2014   Special screening for malignant neoplasms, colon 07/27/2014   Family history of colon cancer in mother 07/27/2014   Acute combined systolic and diastolic heart failure ([place]) 02/10/2014   Yeast infection 08/12/2013   Difficulty swallowing solids 07/21/2013   NSTEMI (non-ST elevated myocardial infarction) ([place]) 07/17/2013   Sepsis ([place]) 07/16/2013   Elevated troponin 07/16/2013   UTI (urinary tract infection) 02/23/2013   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   Encounter for long-term (current) use of other medications 11/24/2012   Screening for malignant neoplasm of prostate 11/24/2012   DM (diabetes mellitus) ([place]) 11/24/2012   History of laparoscopic adjustable gastric banding 03/20/2012   Depression 12/14/2011   Physical deconditioning 12/14/2011   GERD (gastroesophageal reflux disease) 10/31/2011   Paronychia of great toe of right foot 08/22/2011   Pre-operative cardiovascular examination 03/21/2011   ABDOMINAL PAIN 01/18/2011   OBESITY-MORBID (>100') 06/09/2010   Iron deficiency anemia 05/01/2010   CHEST PAIN-PRECORDIAL 12/26/2009   BASAL CELL CARCINOMA, HX OF 10/31/2009   HYPERTROPHY PROSTATE W/UR OBST & OTH LUTS 07/27/2009  FOOT PAIN 07/27/2009    COUGH DUE TO ACE INHIBITORS 06/14/2009   OTITIS MEDIA, ACUTE, LEFT 03/14/2009   GI BLEEDING 02/16/2009   HYPERTENSION, BENIGN 02/02/2009   CAD, NATIVE VESSEL 83/66/2947   DIASTOLIC HEART FAILURE, CHRONIC 02/02/2009   EDEMA 11/22/2008   CONJUNCTIVITIS, RIGHT 06/18/2008   HYPOKALEMIA 06/03/2008   CHF 06/03/2008   OSTEOPOROSIS 04/16/2008   Essential hypertension 04/01/2008   Gout 03/25/2008   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   PERIPHERAL VASCULAR DISEASE 09/01/2007   Disorder resulting from impaired renal function 09/01/2007   Controlled diabetes mellitus type 2 with complications ([place]) 65/46/5035   Dyslipidemia 05/12/2007   IMPOTENCE 05/12/2007   CVA WITH LEFT HEMIPARESIS 05/12/2007   Allergic rhinitis 05/12/2007   COPD (chronic obstructive pulmonary disease) with emphysema ([place]) 05/12/2007   OSTEOARTHRITIS 05/12/2007   SINUS HEADACHE 05/12/2007    Immunization History  Administered Date(s) Administered   Fluad Quad(high Dose 65+) 08/25/2019, 10/04/2020   Influenza Split 09/14/2011, 09/23/2012, 09/03/2013   Influenza Whole 09/10/2008, 09/23/2009, 08/24/2010   Influenza, High Dose Seasonal PF 09/12/2010, 09/24/2011, 08/24/2012, 08/24/2014, 09/12/2015, 09/24/2015, 09/19/2016, 09/23/2017, 10/28/2017, 08/29/2018   Influenza,inj,Quad PF,6+ Mos 09/13/2014   Influenza-Unspecified 08/26/2009, 09/12/2010, 10/24/2010, 09/09/2019, 09/23/2020   Moderna Sars-Covid-2 Vaccination 01/06/2020, 02/03/2020, 08/24/2020   Pneumococcal Conjugate-13 11/11/2014   Pneumococcal Polysaccharide-23 07/24/1996, 07/17/2013, 01/06/2015   Pneumococcal-Unspecified 09/23/2005   Td 02/22/2004   Tdap 12/24/2013, 11/11/2014   Zoster Recombinat (Shingrix) 12/12/2020, 03/30/2021    Conditions to be addressed/monitored:  Hypertension, Hyperlipidemia, Diabetes, Heart Failure, Coronary Artery Disease, GERD, COPD, Chronic Kidney Disease, Depression, Osteoporosis, Osteoarthritis, BPH, Gout and  Allergic Rhinitis  Care Plan : General Pharmacy (Adult)  Updates made by Germaine Pomfret, RPH since 08/22/2021 12:00 AM     Problem: Hypertension, Hyperlipidemia, Diabetes, Heart Failure, Coronary Artery Disease, GERD, COPD, Chronic Kidney Disease, Depression, Osteoporosis, Osteoarthritis, BPH, Gout and Allergic Rhinitis   Priority: High     Long-Range Goal: Patient-Specific Goal   Start Date: 04/12/2021  Expected End Date: 08/22/2022  This Visit's Progress: On track  Recent Progress: On track  Priority: High  Note:   Current Barriers:  Unable to independently afford treatment regimen  Pharmacist Clinical Goal(s):  Patient will verbalize ability to afford treatment regimen maintain control of Heart Failure as evidenced by stable weight, lack of exacerbations  through collaboration with PharmD and provider.   Interventions: 1:1 collaboration with [name], DO regarding development and update of comprehensive plan of care as evidenced by provider attestation and co-signature Inter-disciplinary care team collaboration (see longitudinal plan of care) Comprehensive medication review performed; medication list updated in electronic medical record  Heart Failure (Goal: manage symptoms and prevent exacerbations) -Managed by Dr. Angelena Form  -Controlled -Last ejection fraction: 55-60% (Date: Feb 2019) -HF type: Diastolic -NYHA Class: II (slight limitation of activity) -AHA HF Stage: B (Heart disease and symptoms present) -Current treatment: Carvedilol 25 mg twice daily  Furosemide 40 mg 3 tablets AM, 2 tablets PM  Isosorbide Mononitrate 60 mg daily  -Medications previously tried: NA  -Current home BP/HR readings: NA -Current dietary habits: Limiting salt.   -Current exercise habits: Previously going to [employer].  -Educated on Importance of weighing daily; if you gain more than 3 pounds in one day or 5 pounds in one week, contact provider's office -Recommended to continue  current medication  Hyperlipidemia: (LDL goal < 70) -Controlled -Current treatment: Rosuvastatin 10 mg daily  -Current antiplatelet treatment: Aspirin 81 mg daily  Clopidogrel 75 mg daily  -Medications previously  tried: NA  -Educated on Importance of limiting foods high in cholesterol; -Recommended to continue current medication  Diabetes (A1c goal <8%) -Controlled -Current medications: Farxiga 5 mg 1/2 tablet daily Novolog 9 units before meals   Trulicity 4.5 mg weekly  -Medications previously tried: Prandin (hypoglycemia, falls) -Current home glucose readings 66, 57 due to not eating. Typically Range 124-174.  -Reports hypoglycemic symptoms -Educated on Benefits of routine self-monitoring of blood sugar; Carbohydrate counting and/or plate method -Counseled to check feet daily and get yearly eye exams -Recommended to continue current medication  COPD (Goal: control symptoms and prevent exacerbations) -Controlled -Current treatment  Ventolin HFA 2 puffs every 6 hours as needed  Spiriva Respimat 1 puff daily  Wixela 1 puff twice daily  -Current allergic rhinitis  treatment  Azelastine Nasal spray 2 spray twice daily as needed  Cetirizine 10 mg daily  Benadryl 25 mg nightly  Guaifenesin daily as needed - Drainage  -Medications previously tried: NA  -Gold Grade: Gold 3 (FEV1 30-49%) -Current COPD Classification:  B (high sx, <2 exacerbations/yr) -MMRC/CAT score: 10  -Pulmonary function testing: FEV1 39% (2013) -Exacerbations requiring treatment in last 6 months: No -Patient reports consistent use of maintenance inhaler -Frequency of rescue inhaler use: few times monthly -Counseled on Benefits of consistent maintenance inhaler use When to use rescue inhaler -Recommended to continue current medication  Depression/Insomnia (Goal: Maintain stable mood/sleep) -Controlled -Current treatment: Citalopram 10 mg daily  Trazodone 100 mg nightly  -Medications previously  tried/failed: NA -PHQ9: 0 -GAD7: NA - A couple of nights a year he has a hard time falling asleep. He attributes this to barimetric changes in the weather. Overall feels he has good sleep quality, sleeps soundly through the night. He tends to sleep from 3a-11a due to caring for his daughter who suffers from [place].  -Educated on Benefits of medication for symptom control  -Counseled on proper sleep hygiene -Recommended to continue current medication  Gout (Goal: Prevent gout flares) -Controlled -Current treatment  Allopurinol 300 mg daily  -Medications previously tried: NA -Last gout flare: 2021  -Counseled patient on low-purine diet -Recommended to continue current medication  Patient Goals/Self-Care Activities Patient will:  - check glucose before meals and at bedtime, document, and provide at future appointments check blood pressure 2-3 times weekly, document, and provide at future appointments weigh daily, and contact provider if weight gain of greater than 3 pounds in one day  Follow Up Plan: Telephone follow up appointment with care management team member scheduled for:  02/26/2022 at 3:00 PM       Medication Assistance:  [place] obtained through [place].  Enrollment ends Dec 6314 Trulicity obtained through [employer] medication assistance program.  Enrollment ends Dec 2022  Patient's preferred pharmacy is:  [facility], [address] AT [address] [place] [address] Phone: [phone] Fax: [phone]  [facility] - [address], [address] [place] [address] Phone: [phone] Fax: [phone]  Uses pill box? Yes Pt endorses 100% compliance  We discussed: Current pharmacy is preferred with insurance plan and patient is satisfied with pharmacy services Patient  decided to: Continue current medication management strategy  Care Plan and Follow Up Patient Decision:  Patient agrees to Care Plan and Follow-up.  Plan: Telephone follow up appointment with care management team member scheduled  for:  02/26/2022 at 3:00 PM  Junius Argyle, PharmD, CPP Clinical Pharmacist [facility]  [phone]

## 2021-08-24 ENCOUNTER — Other Ambulatory Visit: Payer: Self-pay

## 2021-08-24 ENCOUNTER — Ambulatory Visit (INDEPENDENT_AMBULATORY_CARE_PROVIDER_SITE_OTHER): Payer: Medicare Other | Admitting: Endocrinology

## 2021-08-24 VITALS — BP 120/100 | HR 95 | Ht 63.0 in | Wt 210.4 lb

## 2021-08-24 DIAGNOSIS — E1151 Type 2 diabetes mellitus with diabetic peripheral angiopathy without gangrene: Secondary | ICD-10-CM | POA: Diagnosis not present

## 2021-08-24 DIAGNOSIS — Z794 Long term (current) use of insulin: Secondary | ICD-10-CM | POA: Diagnosis not present

## 2021-08-24 DIAGNOSIS — I255 Ischemic cardiomyopathy: Secondary | ICD-10-CM | POA: Diagnosis not present

## 2021-08-24 LAB — POCT GLYCOSYLATED HEMOGLOBIN (HGB A1C): Hemoglobin A1C: 6.3 % — AB (ref 4.0–5.6)

## 2021-08-24 MED ORDER — INSULIN ASPART 100 UNIT/ML IJ SOLN: 7.0000 [IU] | Freq: Three times a day (TID) | INTRAMUSCULAR | 3 refills | Status: AC
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
## 2021-08-24 NOTE — Patient Instructions (Addendum)
please continue the same Trulicity and [place].   Please change the insulin to 7-10 units 3 times a day (just before each meal).   check your blood sugar twice a day.  vary the time of day when you check, between before the 3 meals, and at bedtime.  also check if you have symptoms of your blood sugar being too high or too low.  please keep a record of the readings and bring it to your next appointment here (or you can bring the meter itself).  You can write it on any piece of paper.  please call us sooner if your blood sugar goes below 70, or if you have a lot of readings over 200.   Please come back for a follow-up appointment in January.

## 2021-09-15 ENCOUNTER — Telehealth: Payer: Self-pay | Admitting: Family Medicine

## 2021-09-18 ENCOUNTER — Telehealth: Payer: Self-pay | Admitting: Family Medicine

## 2021-09-18 NOTE — Telephone Encounter (Signed)
Spoke to patient and he states that he received a message form his mail order that her needed refill on Citalopram and Allopurinol. Both were sent 06/28/21 wit #[phone] , 3 rfs . He is not out and will call them to have them check and we will see him on 09/20/21. Dm/cma

## 2021-09-19 NOTE — Telephone Encounter (Signed)
Patient has appointment tomorrow. Will take care of it at that time due to was advised yesterday that he was not out.  Dm/cma

## 2021-09-20 ENCOUNTER — Other Ambulatory Visit: Payer: Self-pay

## 2021-09-20 ENCOUNTER — Ambulatory Visit (INDEPENDENT_AMBULATORY_CARE_PROVIDER_SITE_OTHER): Payer: Medicare Other | Admitting: Family Medicine

## 2021-09-20 ENCOUNTER — Encounter: Payer: Self-pay | Admitting: Family Medicine

## 2021-09-20 VITALS — BP 130/72 | HR 87 | Temp 97.7°F | Ht 63.0 in | Wt 211.4 lb

## 2021-09-20 DIAGNOSIS — Z8673 Personal history of transient ischemic attack (TIA), and cerebral infarction without residual deficits: Secondary | ICD-10-CM | POA: Insufficient documentation

## 2021-09-20 DIAGNOSIS — M1A9XX0 Chronic gout, unspecified, without tophus (tophi): Secondary | ICD-10-CM | POA: Diagnosis not present

## 2021-09-20 DIAGNOSIS — J439 Emphysema, unspecified: Secondary | ICD-10-CM

## 2021-09-20 DIAGNOSIS — N1832 Chronic kidney disease, stage 3b: Secondary | ICD-10-CM | POA: Insufficient documentation

## 2021-09-20 DIAGNOSIS — Z23 Encounter for immunization: Secondary | ICD-10-CM | POA: Diagnosis not present

## 2021-09-20 DIAGNOSIS — I10 Essential (primary) hypertension: Secondary | ICD-10-CM | POA: Diagnosis not present

## 2021-09-20 DIAGNOSIS — I480 Paroxysmal atrial fibrillation: Secondary | ICD-10-CM | POA: Insufficient documentation

## 2021-09-20 DIAGNOSIS — E118 Type 2 diabetes mellitus with unspecified complications: Secondary | ICD-10-CM | POA: Diagnosis not present

## 2021-09-20 DIAGNOSIS — E785 Hyperlipidemia, unspecified: Secondary | ICD-10-CM | POA: Diagnosis not present

## 2021-09-20 DIAGNOSIS — F32A Depression, unspecified: Secondary | ICD-10-CM | POA: Diagnosis not present

## 2021-09-20 DIAGNOSIS — M810 Age-related osteoporosis without current pathological fracture: Secondary | ICD-10-CM | POA: Diagnosis not present

## 2021-09-20 DIAGNOSIS — I5032 Chronic diastolic (congestive) heart failure: Secondary | ICD-10-CM

## 2021-09-20 DIAGNOSIS — N183 Chronic kidney disease, stage 3 unspecified: Secondary | ICD-10-CM | POA: Insufficient documentation

## 2021-09-20 LAB — URINALYSIS, ROUTINE W REFLEX MICROSCOPIC
Bilirubin Urine: NEGATIVE
Hgb urine dipstick: NEGATIVE
Ketones, ur: NEGATIVE
Leukocytes,Ua: NEGATIVE
Nitrite: NEGATIVE
Specific Gravity, Urine: 1.01 (ref 1.000–1.030)
Urine Glucose: >=1000 — AB
Urobilinogen, UA: 0.2 (ref 0.0–1.0)
pH: 6 (ref 5.0–8.0)

## 2021-09-20 LAB — MICROALBUMIN / CREATININE URINE RATIO
Creatinine,U: 42.2 mg/dL
Microalb Creat Ratio: 45.7 mg/g — ABNORMAL HIGH (ref 0.0–30.0)
Microalb, Ur: 19.3 mg/dL — ABNORMAL HIGH (ref 0.0–1.9)

## 2021-09-20 LAB — HEMOGLOBIN A1C: Hgb A1c MFr Bld: 6.6 % — ABNORMAL HIGH (ref 4.6–6.5)

## 2021-09-20 LAB — BASIC METABOLIC PANEL
BUN: 35 mg/dL — ABNORMAL HIGH (ref 6–23)
CO2: 32 mEq/L (ref 19–32)
Calcium: 9.1 mg/dL (ref 8.4–10.5)
Chloride: 97 mEq/L (ref 96–112)
Creatinine, Ser: 1.58 mg/dL — ABNORMAL HIGH (ref 0.40–1.50)
GFR: 41.38 mL/min — ABNORMAL LOW (ref >60.00)
Glucose, Bld: 178 mg/dL — ABNORMAL HIGH (ref 70–99)
Potassium: 3.5 mEq/L (ref 3.5–5.1)
Sodium: 137 mEq/L (ref 135–145)

## 2021-09-20 LAB — LIPID PANEL
Cholesterol: 133 mg/dL (ref 0–200)
HDL: 46.2 mg/dL (ref >39.00)
LDL Cholesterol: 67 mg/dL (ref 0–99)
NonHDL: 86.96
Total CHOL/HDL Ratio: 3
Triglycerides: 98 mg/dL (ref 0.0–149.0)
VLDL: 19.6 mg/dL (ref 0.0–40.0)

## 2021-09-20 MED ORDER — ALLOPURINOL 300 MG PO TABS: ORAL_TABLET | ORAL | 3 refills | Status: DC

## 2021-09-20 MED ORDER — CALCITRIOL 0.25 MCG PO CAPS: 0.2500 ug | ORAL_CAPSULE | Freq: Every day | ORAL | 0 refills | Status: DC

## 2021-09-20 MED ORDER — CITALOPRAM HYDROBROMIDE 10 MG PO TABS: ORAL_TABLET | ORAL | 3 refills | Status: DC
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
## 2021-09-21 ENCOUNTER — Encounter: Payer: Self-pay | Admitting: Family Medicine

## 2021-09-21 DIAGNOSIS — Z23 Encounter for immunization: Secondary | ICD-10-CM | POA: Diagnosis not present

## 2021-10-10 ENCOUNTER — Other Ambulatory Visit: Payer: Self-pay | Admitting: Family Medicine

## 2021-10-10 DIAGNOSIS — M810 Age-related osteoporosis without current pathological fracture: Secondary | ICD-10-CM

## 2021-10-13 ENCOUNTER — Telehealth: Payer: Self-pay

## 2021-10-13 NOTE — Progress Notes (Signed)
Chronic Care Management Pharmacy Assistant   Name: [name].  MRN: [id number] DOB: 05-13-1941  Reason for Encounter: Medication Review/Patient renewal for Trulicity and [place].   Recent office visits:  09/20/2021 Dr.Rudd MD (PCP)No medication Changes noted, follow up in 3 months.  Recent consult visits:  08/24/2021 Dr.Ellison MD (Endocrinology) change the insulin Aspart to 7-10 units 3 times a day (just before each meal).    Hospital visits:  None in previous 6 months  Medications: Outpatient Encounter Medications as of 10/13/2021  Medication Sig   albuterol (VENTOLIN HFA) 108 (90 Base) MCG/ACT inhaler Inhale 2 puffs into the lungs every 6 (six) hours as needed for wheezing or shortness of breath.   allopurinol (ZYLOPRIM) 300 MG tablet TAKE 1 TABLET EVERY DAY   AMBULATORY NON FORMULARY MEDICATION O2 @ 2LMP @ night   aspirin 81 MG tablet Take 81 mg by mouth daily.   Azelastine HCl 137 MCG/SPRAY SOLN INSTILL 2 SPRAYS INTO BOTH NOSTRILS TWICE DAILY AS NEEDED FOR RHINITIS.   B Complex Vitamins (VITAMIN B COMPLEX PO) Take 1 tablet by mouth daily.   calcitRIOL (ROCALTROL) 0.25 MCG capsule TAKE 1 CAPSULE EVERY DAY   carvedilol (COREG) 25 MG tablet TAKE 1 TABLET TWICE DAILY WITH MEALS   cetirizine (ZYRTEC) 10 MG tablet Take 10 mg by mouth as needed for allergies.    cholecalciferol (VITAMIN D3) 25 MCG (1000 UNIT) tablet Take 1,000 Units by mouth daily.   citalopram (CELEXA) 10 MG tablet TAKE 1 TABLET EVERY DAY   clopidogrel (PLAVIX) 75 MG tablet TAKE 1 TABLET EVERY DAY   dapagliflozin propanediol (FARXIGA) 5 MG TABS tablet Take 2.5 mg by mouth daily.   diphenhydrAMINE (BENADRYL) 25 MG tablet Take 25 mg by mouth at bedtime.   Dulaglutide (TRULICITY) 4.5 HY/8.6VH SOPN Inject 4.5 mg as directed once a week.   ferrous sulfate 324 MG TBEC Take 324 mg by mouth daily at 2 PM.   finasteride (PROSCAR) 5 MG tablet TAKE 1 TABLET EVERY DAY   Fluticasone-Salmeterol (ADVAIR) 250-50  MCG/DOSE AEPB Inhale 1 puff into the lungs 2 (two) times daily.   furosemide (LASIX) 40 MG tablet TAKE 3 TABLETS BY MOUTH EVERY MORNING AND 2 TABLETS EVERY AFTERNOON. (DOSE INCREASE)   GuaiFENesin (MUCINEX MAXIMUM STRENGTH PO) Take 1 Dose by mouth as needed (drainage).   insulin aspart (NOVOLOG) 100 UNIT/ML injection Inject 7-9 Units into the skin 3 (three) times daily before meals.   isosorbide mononitrate (IMDUR) 60 MG 24 hr tablet Take 1 tablet (60 mg total) by mouth daily.   ketoconazole (NIZORAL) 2 % cream Apply 1 application topically daily as needed for irritation.    Multiple Vitamins-Minerals (MULTIVITAMIN PO) Take 1 tablet by mouth daily.   nitroGLYCERIN (NITROLINGUAL) 0.4 MG/SPRAY spray Place 1 spray under the tongue as directed.   pantoprazole (PROTONIX) 40 MG tablet TAKE 1 TABLET EVERY DAY   polyethylene glycol powder (GLYCOLAX/MIRALAX) 17 GM/SCOOP powder Take 17 g by mouth daily as needed (constipation).   potassium chloride (KLOR-CON) 10 MEQ tablet TAKE 1 TABLET EVERY DAY   Protein (UNJURY UNFLAVORED PO) Take 8-16 oz by mouth daily.   rosuvastatin (CRESTOR) 10 MG tablet TAKE 1 TABLET EVERY DAY   Tiotropium Bromide Monohydrate (SPIRIVA RESPIMAT) 2.5 MCG/ACT AERS Inhale 1 puff into the lungs daily.   traZODone (DESYREL) 100 MG tablet TAKE 1 TABLET AT BEDTIME   No facility-administered encounter medications on file as of 10/13/2021.   Care Gaps: Hepatitis C Screening COVID-19 Vaccine (4- Booster  for [employer]) Star Rating Drug: 07/06/2021 Rosuvastatin 10 mg last filled for 90-Day supply (No Pharmacy on noted) 01/01/3234 Trulicity 4.5 mg last filled for a 28- Day Supply  at [facility] Wilder Glade 5 mg covered under patient assistance through 2022.   Any gaps in medications fill history Citalopram 10 mg last filled 04/19/2021  90 day supply Allopurinol 300 mg last filled 04/19/2021 or 90 day supply. Calcitriol 0.25 last filled 04/19/2021  for 90 day  supply. Isosorbide mononitrate  60 mg last filled 06/15/2021 for 90 day supply Potassium  10 MEQ last filled 05/17/2021 for 90 day supply Repaglinide 2 mg last filled 04/25/2021 for 90 day supply. Trazodone 100 mg last filled 05/28/2021 90 day supply. Finasteride 5 mg last filled 05/16/2021 for 90 day supply. Furosemide 40 mg  last filled 06/09/2021 for 90 day supply Carvedilol  25 mglast filled 05/11/2021 for 90 day supply.  I received a task from Junius Argyle, CPP requesting I start the renewal application process for patient assistance on the medication Trulicity and Wilder Glade to continue to receive assistance through year 2023.    Spoke with the patient and he  requested that the application mailed.I informed  him once he receives the application he will need to complete his part of the application and return it to his Endocrinology office for Dr.Ellison  to sign his part and to fax over to the [facility] for processing.Informed patient to include a copy of his proof of income AND a copy of his Explanation of Benefits (EOB) statement from his insurance. The application will need to be faxed to [phone] and the phone number that can be called to check the status of the application will be [phone]. Patient verbalized understanding and was provided my phone number of [phone] if he has any questions.   Patient does not have to resubmit application for [place]ine[facility] Real because he has medicare.Per [place], Patients who has Medicare will automatically be renewal for year 2023.  Application emailed to Junius Argyle, CPP for review and to Mail to patient home.   Diabetes: Patient states his blood sugars has been good ranging from 133-155 through out the day.Patient reports he checks his blood sugars 3-4 times daily.Patient states he does keep a blood sugar log, but he is not at home at the moment to give me the exact readings.   [place] Pharmacist Assistant [phone]

## 2021-10-17 ENCOUNTER — Encounter: Payer: Self-pay | Admitting: Family Medicine

## 2021-10-17 ENCOUNTER — Telehealth (INDEPENDENT_AMBULATORY_CARE_PROVIDER_SITE_OTHER): Payer: Medicare Other | Admitting: Family Medicine

## 2021-10-17 VITALS — BP 99/58 | HR 89 | Temp 98.1°F | Wt 205.0 lb

## 2021-10-17 DIAGNOSIS — U071 COVID-19: Secondary | ICD-10-CM | POA: Diagnosis not present

## 2021-10-17 MED ORDER — NIRMATRELVIR/RITONAVIR (PAXLOVID) TABLET (RENAL DOSING): 2.0000 | ORAL_TABLET | Freq: Two times a day (BID) | ORAL | 0 refills | Status: AC

## 2021-10-17 MED ORDER — BENZONATATE 100 MG PO CAPS: 100.0000 mg | ORAL_CAPSULE | Freq: Two times a day (BID) | ORAL | 0 refills | Status: DC | PRN
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
## 2021-10-26 ENCOUNTER — Encounter: Payer: Self-pay | Admitting: Family Medicine

## 2021-10-26 DIAGNOSIS — I5032 Chronic diastolic (congestive) heart failure: Secondary | ICD-10-CM

## 2021-10-26 DIAGNOSIS — J439 Emphysema, unspecified: Secondary | ICD-10-CM

## 2021-10-26 DIAGNOSIS — U071 COVID-19: Secondary | ICD-10-CM

## 2021-10-26 MED ORDER — GUAIFENESIN-CODEINE 100-10 MG/5ML PO SOLN
5.0000 mL | Freq: Three times a day (TID) | ORAL | 0 refills | Status: DC | PRN
Start: 2021-10-26 — End: 2021-11-14

## 2021-10-26 NOTE — Telephone Encounter (Signed)
Your schedule is full on Monday. First available is 11/11 @ 11:30. Do you want to double book an appointment?  Dm/cma

## 2021-10-26 NOTE — Telephone Encounter (Signed)
Spoke to patient, he is not able to come in for an 8:00 am appt.  The next one available is 11/11 @ 11:30, scheduled him there and he is agreeable to that.  Advised that he can call and see if anything opens up sooner.  Dm/cma

## 2021-10-26 NOTE — Telephone Encounter (Signed)
I called and spoke with Mr. Ronan. I had a video visit with him 8 days ago related to an acute COVID-19 infection. He was prescribed Paxlovid. He notes that he continues to have cough productive of whitish phlegm. He also reports issues with his breathing. He notes some dyspnea with exertion, wheezing, and orthopnea since having COVID. He has checked his oxygen level at home and his sats are running 94-95%. He has not seen an increase in his weight, but does note some mild increase in his ankle edema. mr. Caponi has a history of COPD and CHF. He notes that he is only on Lasix in the morning. The afternoon dose was apparently stopped after he started on Farxiga.  I will send in a Rx for cough syrup. I advised him to take an extra dose of his Lasix in the afternoon. I advised him to have a low threshold for going to be seen in person to evaluae his COPD and CHF. I will ask the clinic to contact him about an appointment on Monday in clinic for me to reassess how he is doing.  Haydee Salter, MD

## 2021-11-02 ENCOUNTER — Other Ambulatory Visit: Payer: Self-pay

## 2021-11-02 MED ORDER — ISOSORBIDE MONONITRATE ER 60 MG PO TB24: 60.0000 mg | ORAL_TABLET | Freq: Every day | ORAL | 1 refills | Status: DC

## 2021-11-03 ENCOUNTER — Ambulatory Visit (INDEPENDENT_AMBULATORY_CARE_PROVIDER_SITE_OTHER): Payer: Medicare Other | Admitting: Family Medicine

## 2021-11-03 ENCOUNTER — Encounter: Payer: Self-pay | Admitting: Family Medicine

## 2021-11-03 ENCOUNTER — Ambulatory Visit (INDEPENDENT_AMBULATORY_CARE_PROVIDER_SITE_OTHER): Payer: Medicare Other

## 2021-11-03 ENCOUNTER — Other Ambulatory Visit: Payer: Self-pay

## 2021-11-03 VITALS — BP 118/60 | Temp 97.7°F | Ht 63.0 in | Wt 211.0 lb

## 2021-11-03 DIAGNOSIS — J189 Pneumonia, unspecified organism: Secondary | ICD-10-CM | POA: Diagnosis not present

## 2021-11-03 DIAGNOSIS — J90 Pleural effusion, not elsewhere classified: Secondary | ICD-10-CM | POA: Diagnosis not present

## 2021-11-03 DIAGNOSIS — R059 Cough, unspecified: Secondary | ICD-10-CM | POA: Diagnosis not present

## 2021-11-03 DIAGNOSIS — I5033 Acute on chronic diastolic (congestive) heart failure: Secondary | ICD-10-CM

## 2021-11-03 DIAGNOSIS — R0600 Dyspnea, unspecified: Secondary | ICD-10-CM | POA: Diagnosis not present

## 2021-11-03 DIAGNOSIS — J441 Chronic obstructive pulmonary disease with (acute) exacerbation: Secondary | ICD-10-CM

## 2021-11-03 LAB — CBC
HCT: 36 % — ABNORMAL LOW (ref 39.0–52.0)
Hemoglobin: 11.9 g/dL — ABNORMAL LOW (ref 13.0–17.0)
MCHC: 33.2 g/dL (ref 30.0–36.0)
MCV: 93.5 fl (ref 78.0–100.0)
Platelets: 313 10*3/uL (ref 150.0–400.0)
RBC: 3.85 Mil/uL — ABNORMAL LOW (ref 4.22–5.81)
RDW: 15 % (ref 11.5–15.5)
WBC: 6.4 10*3/uL (ref 4.0–10.5)

## 2021-11-03 LAB — BASIC METABOLIC PANEL
BUN: 43 mg/dL — ABNORMAL HIGH (ref 6–23)
CO2: 32 mEq/L (ref 19–32)
Calcium: 9.2 mg/dL (ref 8.4–10.5)
Chloride: 99 mEq/L (ref 96–112)
Creatinine, Ser: 1.49 mg/dL (ref 0.40–1.50)
GFR: 44.36 mL/min — ABNORMAL LOW (ref >60.00)
Glucose, Bld: 170 mg/dL — ABNORMAL HIGH (ref 70–99)
Potassium: 4.5 mEq/L (ref 3.5–5.1)
Sodium: 137 mEq/L (ref 135–145)

## 2021-11-03 LAB — BRAIN NATRIURETIC PEPTIDE: Pro B Natriuretic peptide (BNP): 168 pg/mL — ABNORMAL HIGH (ref 0.0–100.0)

## 2021-11-03 MED ORDER — PREDNISONE 20 MG PO TABS: 40.0000 mg | ORAL_TABLET | Freq: Every day | ORAL | 0 refills | Status: DC

## 2021-11-03 MED ORDER — METOLAZONE 2.5 MG PO TABS: 2.5000 mg | ORAL_TABLET | Freq: Every day | ORAL | 0 refills | Status: DC

## 2021-11-03 MED ORDER — LEVOFLOXACIN 500 MG PO TABS
500.0000 mg | ORAL_TABLET | Freq: Every day | ORAL | 0 refills | Status: DC
Start: 2021-11-03 — End: 2021-11-14
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
## 2021-11-07 ENCOUNTER — Other Ambulatory Visit: Payer: Self-pay

## 2021-11-07 ENCOUNTER — Inpatient Hospital Stay (HOSPITAL_COMMUNITY)
Admission: EM | Admit: 2021-11-07 | Discharge: 2021-11-14 | DRG: 280 | Disposition: A | Discharge status: Home Health | Payer: Medicare Other | Attending: Internal Medicine | Admitting: Internal Medicine

## 2021-11-07 ENCOUNTER — Emergency Department (HOSPITAL_COMMUNITY): Payer: Medicare Other

## 2021-11-07 DIAGNOSIS — J4489 Other specified chronic obstructive pulmonary disease: Secondary | ICD-10-CM | POA: Diagnosis present

## 2021-11-07 DIAGNOSIS — F32A Depression, unspecified: Secondary | ICD-10-CM | POA: Diagnosis present

## 2021-11-07 DIAGNOSIS — I214 Non-ST elevation (NSTEMI) myocardial infarction: Secondary | ICD-10-CM | POA: Diagnosis not present

## 2021-11-07 DIAGNOSIS — Z7982 Long term (current) use of aspirin: Secondary | ICD-10-CM

## 2021-11-07 DIAGNOSIS — R739 Hyperglycemia, unspecified: Secondary | ICD-10-CM | POA: Diagnosis not present

## 2021-11-07 DIAGNOSIS — E669 Obesity, unspecified: Secondary | ICD-10-CM | POA: Diagnosis present

## 2021-11-07 DIAGNOSIS — Z955 Presence of coronary angioplasty implant and graft: Secondary | ICD-10-CM

## 2021-11-07 DIAGNOSIS — E6609 Other obesity due to excess calories: Secondary | ICD-10-CM | POA: Diagnosis present

## 2021-11-07 DIAGNOSIS — N179 Acute kidney failure, unspecified: Secondary | ICD-10-CM | POA: Diagnosis not present

## 2021-11-07 DIAGNOSIS — Z79899 Other long term (current) drug therapy: Secondary | ICD-10-CM

## 2021-11-07 DIAGNOSIS — R778 Other specified abnormalities of plasma proteins: Secondary | ICD-10-CM

## 2021-11-07 DIAGNOSIS — N1832 Chronic kidney disease, stage 3b: Secondary | ICD-10-CM | POA: Diagnosis present

## 2021-11-07 DIAGNOSIS — E440 Moderate protein-calorie malnutrition: Secondary | ICD-10-CM | POA: Insufficient documentation

## 2021-11-07 DIAGNOSIS — E871 Hypo-osmolality and hyponatremia: Secondary | ICD-10-CM | POA: Diagnosis present

## 2021-11-07 DIAGNOSIS — I480 Paroxysmal atrial fibrillation: Secondary | ICD-10-CM | POA: Diagnosis present

## 2021-11-07 DIAGNOSIS — T82855A Stenosis of coronary artery stent, initial encounter: Secondary | ICD-10-CM | POA: Diagnosis present

## 2021-11-07 DIAGNOSIS — E860 Dehydration: Secondary | ICD-10-CM | POA: Diagnosis present

## 2021-11-07 DIAGNOSIS — I252 Old myocardial infarction: Secondary | ICD-10-CM

## 2021-11-07 DIAGNOSIS — Z91013 Allergy to seafood: Secondary | ICD-10-CM

## 2021-11-07 DIAGNOSIS — Z8673 Personal history of transient ischemic attack (TIA), and cerebral infarction without residual deficits: Secondary | ICD-10-CM

## 2021-11-07 DIAGNOSIS — Z6835 Body mass index (BMI) 35.0-35.9, adult: Secondary | ICD-10-CM

## 2021-11-07 DIAGNOSIS — E878 Other disorders of electrolyte and fluid balance, not elsewhere classified: Secondary | ICD-10-CM | POA: Diagnosis present

## 2021-11-07 DIAGNOSIS — M898X9 Other specified disorders of bone, unspecified site: Secondary | ICD-10-CM | POA: Diagnosis present

## 2021-11-07 DIAGNOSIS — E1165 Type 2 diabetes mellitus with hyperglycemia: Secondary | ICD-10-CM | POA: Diagnosis not present

## 2021-11-07 DIAGNOSIS — I69354 Hemiplegia and hemiparesis following cerebral infarction affecting left non-dominant side: Secondary | ICD-10-CM

## 2021-11-07 DIAGNOSIS — R079 Chest pain, unspecified: Secondary | ICD-10-CM | POA: Diagnosis not present

## 2021-11-07 DIAGNOSIS — Z888 Allergy status to other drugs, medicaments and biological substances status: Secondary | ICD-10-CM

## 2021-11-07 DIAGNOSIS — Y831 Surgical operation with implant of artificial internal device as the cause of abnormal reaction of the patient, or of later complication, without mention of misadventure at the time of the procedure: Secondary | ICD-10-CM | POA: Diagnosis present

## 2021-11-07 DIAGNOSIS — I4891 Unspecified atrial fibrillation: Secondary | ICD-10-CM | POA: Diagnosis not present

## 2021-11-07 DIAGNOSIS — R0602 Shortness of breath: Secondary | ICD-10-CM

## 2021-11-07 DIAGNOSIS — E1169 Type 2 diabetes mellitus with other specified complication: Secondary | ICD-10-CM

## 2021-11-07 DIAGNOSIS — D509 Iron deficiency anemia, unspecified: Secondary | ICD-10-CM | POA: Diagnosis present

## 2021-11-07 DIAGNOSIS — M810 Age-related osteoporosis without current pathological fracture: Secondary | ICD-10-CM | POA: Diagnosis present

## 2021-11-07 DIAGNOSIS — Z833 Family history of diabetes mellitus: Secondary | ICD-10-CM

## 2021-11-07 DIAGNOSIS — J9811 Atelectasis: Secondary | ICD-10-CM | POA: Diagnosis present

## 2021-11-07 DIAGNOSIS — E1122 Type 2 diabetes mellitus with diabetic chronic kidney disease: Secondary | ICD-10-CM | POA: Diagnosis present

## 2021-11-07 DIAGNOSIS — I25110 Atherosclerotic heart disease of native coronary artery with unstable angina pectoris: Secondary | ICD-10-CM | POA: Diagnosis present

## 2021-11-07 DIAGNOSIS — R0789 Other chest pain: Secondary | ICD-10-CM | POA: Diagnosis not present

## 2021-11-07 DIAGNOSIS — E785 Hyperlipidemia, unspecified: Secondary | ICD-10-CM | POA: Diagnosis present

## 2021-11-07 DIAGNOSIS — Z85828 Personal history of other malignant neoplasm of skin: Secondary | ICD-10-CM

## 2021-11-07 DIAGNOSIS — I890 Lymphedema, not elsewhere classified: Secondary | ICD-10-CM | POA: Diagnosis present

## 2021-11-07 DIAGNOSIS — N2581 Secondary hyperparathyroidism of renal origin: Secondary | ICD-10-CM | POA: Diagnosis present

## 2021-11-07 DIAGNOSIS — Z8616 Personal history of COVID-19: Secondary | ICD-10-CM

## 2021-11-07 DIAGNOSIS — J9612 Chronic respiratory failure with hypercapnia: Secondary | ICD-10-CM | POA: Diagnosis present

## 2021-11-07 DIAGNOSIS — E118 Type 2 diabetes mellitus with unspecified complications: Secondary | ICD-10-CM | POA: Diagnosis present

## 2021-11-07 DIAGNOSIS — Z9981 Dependence on supplemental oxygen: Secondary | ICD-10-CM

## 2021-11-07 DIAGNOSIS — Z87891 Personal history of nicotine dependence: Secondary | ICD-10-CM

## 2021-11-07 DIAGNOSIS — J9611 Chronic respiratory failure with hypoxia: Secondary | ICD-10-CM | POA: Diagnosis present

## 2021-11-07 DIAGNOSIS — I5032 Chronic diastolic (congestive) heart failure: Secondary | ICD-10-CM | POA: Diagnosis present

## 2021-11-07 DIAGNOSIS — J449 Chronic obstructive pulmonary disease, unspecified: Secondary | ICD-10-CM | POA: Diagnosis present

## 2021-11-07 DIAGNOSIS — J439 Emphysema, unspecified: Secondary | ICD-10-CM | POA: Diagnosis present

## 2021-11-07 DIAGNOSIS — E876 Hypokalemia: Secondary | ICD-10-CM | POA: Diagnosis present

## 2021-11-07 DIAGNOSIS — I5043 Acute on chronic combined systolic (congestive) and diastolic (congestive) heart failure: Secondary | ICD-10-CM | POA: Diagnosis present

## 2021-11-07 DIAGNOSIS — I10 Essential (primary) hypertension: Secondary | ICD-10-CM | POA: Diagnosis present

## 2021-11-07 DIAGNOSIS — I2510 Atherosclerotic heart disease of native coronary artery without angina pectoris: Secondary | ICD-10-CM | POA: Diagnosis present

## 2021-11-07 DIAGNOSIS — Z794 Long term (current) use of insulin: Secondary | ICD-10-CM

## 2021-11-07 DIAGNOSIS — I130 Hypertensive heart and chronic kidney disease with heart failure and stage 1 through stage 4 chronic kidney disease, or unspecified chronic kidney disease: Secondary | ICD-10-CM | POA: Diagnosis present

## 2021-11-07 DIAGNOSIS — N19 Unspecified kidney failure: Secondary | ICD-10-CM | POA: Diagnosis not present

## 2021-11-07 DIAGNOSIS — E8729 Other acidosis: Secondary | ICD-10-CM | POA: Diagnosis present

## 2021-11-07 DIAGNOSIS — J189 Pneumonia, unspecified organism: Secondary | ICD-10-CM

## 2021-11-07 DIAGNOSIS — D631 Anemia in chronic kidney disease: Secondary | ICD-10-CM | POA: Diagnosis present

## 2021-11-07 DIAGNOSIS — U071 COVID-19: Secondary | ICD-10-CM | POA: Diagnosis not present

## 2021-11-07 DIAGNOSIS — M109 Gout, unspecified: Secondary | ICD-10-CM | POA: Diagnosis present

## 2021-11-07 DIAGNOSIS — K219 Gastro-esophageal reflux disease without esophagitis: Secondary | ICD-10-CM | POA: Diagnosis present

## 2021-11-07 DIAGNOSIS — Z7902 Long term (current) use of antithrombotics/antiplatelets: Secondary | ICD-10-CM

## 2021-11-07 DIAGNOSIS — R0902 Hypoxemia: Secondary | ICD-10-CM | POA: Diagnosis not present

## 2021-11-07 DIAGNOSIS — E1151 Type 2 diabetes mellitus with diabetic peripheral angiopathy without gangrene: Secondary | ICD-10-CM | POA: Diagnosis present

## 2021-11-07 DIAGNOSIS — I69959 Hemiplegia and hemiparesis following unspecified cerebrovascular disease affecting unspecified side: Secondary | ICD-10-CM

## 2021-11-07 DIAGNOSIS — Z86711 Personal history of pulmonary embolism: Secondary | ICD-10-CM

## 2021-11-07 DIAGNOSIS — Z8249 Family history of ischemic heart disease and other diseases of the circulatory system: Secondary | ICD-10-CM

## 2021-11-07 DIAGNOSIS — E872 Acidosis, unspecified: Secondary | ICD-10-CM | POA: Diagnosis present

## 2021-11-07 DIAGNOSIS — J42 Unspecified chronic bronchitis: Secondary | ICD-10-CM | POA: Diagnosis present

## 2021-11-07 LAB — BASIC METABOLIC PANEL
Anion gap: 19 — ABNORMAL HIGH (ref 5–15)
Anion gap: 16 — ABNORMAL HIGH (ref 5–15)
BUN: 93 mg/dL — ABNORMAL HIGH (ref 8–23)
BUN: 98 mg/dL — ABNORMAL HIGH (ref 8–23)
CO2: 35 mmol/L — ABNORMAL HIGH (ref 22–32)
CO2: 34 mmol/L — ABNORMAL HIGH (ref 22–32)
Calcium: 9.7 mg/dL (ref 8.9–10.3)
Calcium: 9.5 mg/dL (ref 8.9–10.3)
Chloride: 76 mmol/L — ABNORMAL LOW (ref 98–111)
Chloride: 82 mmol/L — ABNORMAL LOW (ref 98–111)
Creatinine, Ser: 2.65 mg/dL — ABNORMAL HIGH (ref 0.61–1.24)
Creatinine, Ser: 2.69 mg/dL — ABNORMAL HIGH (ref 0.61–1.24)
GFR, Estimated: 24 mL/min — ABNORMAL LOW (ref >60)
GFR, Estimated: 23 mL/min — ABNORMAL LOW (ref >60)
Glucose, Bld: 259 mg/dL — ABNORMAL HIGH (ref 70–99)
Glucose, Bld: 111 mg/dL — ABNORMAL HIGH (ref 70–99)
Potassium: 2.7 mmol/L — CL (ref 3.5–5.1)
Potassium: 3.3 mmol/L — ABNORMAL LOW (ref 3.5–5.1)
Sodium: 130 mmol/L — ABNORMAL LOW (ref 135–145)
Sodium: 132 mmol/L — ABNORMAL LOW (ref 135–145)

## 2021-11-07 LAB — CBC
HCT: 42.8 % (ref 39.0–52.0)
Hemoglobin: 14.3 g/dL (ref 13.0–17.0)
MCH: 30.3 pg (ref 26.0–34.0)
MCHC: 33.4 g/dL (ref 30.0–36.0)
MCV: 90.7 fL (ref 80.0–100.0)
Platelets: 412 10*3/uL — ABNORMAL HIGH (ref 150–400)
RBC: 4.72 MIL/uL (ref 4.22–5.81)
RDW: 13.5 % (ref 11.5–15.5)
WBC: 11 10*3/uL — ABNORMAL HIGH (ref 4.0–10.5)
nRBC: 0 % (ref 0.0–0.2)

## 2021-11-07 LAB — RESP PANEL BY RT-PCR (FLU A&B, COVID) ARPGX2
Influenza A by PCR: NEGATIVE
Influenza B by PCR: NEGATIVE
SARS Coronavirus 2 by RT PCR: POSITIVE — AB

## 2021-11-07 LAB — TROPONIN I (HIGH SENSITIVITY)
Troponin I (High Sensitivity): 30 ng/L — ABNORMAL HIGH (ref <18)
Troponin I (High Sensitivity): 589 ng/L (ref <18)
Troponin I (High Sensitivity): 2957 ng/L (ref <18)

## 2021-11-07 LAB — TSH: TSH: 2.937 u[IU]/mL (ref 0.350–4.500)

## 2021-11-07 LAB — CBG MONITORING, ED
Glucose-Capillary: 196 mg/dL — ABNORMAL HIGH (ref 70–99)
Glucose-Capillary: 284 mg/dL — ABNORMAL HIGH (ref 70–99)

## 2021-11-07 LAB — MAGNESIUM: Magnesium: 2.5 mg/dL — ABNORMAL HIGH (ref 1.7–2.4)

## 2021-11-07 LAB — BRAIN NATRIURETIC PEPTIDE: B Natriuretic Peptide: 197.4 pg/mL — ABNORMAL HIGH (ref 0.0–100.0)

## 2021-11-07 MED ORDER — ALBUTEROL SULFATE HFA 108 (90 BASE) MCG/ACT IN AERS
2.0000 | INHALATION_SPRAY | Freq: Four times a day (QID) | RESPIRATORY_TRACT | Status: DC | PRN
  Administered 2021-11-08 – 2021-11-13 (×2): 2 via RESPIRATORY_TRACT
  Filled 2021-11-07: qty 6.7

## 2021-11-07 MED ORDER — MOMETASONE FURO-FORMOTEROL FUM 200-5 MCG/ACT IN AERO
2.0000 | INHALATION_SPRAY | Freq: Two times a day (BID) | RESPIRATORY_TRACT | Status: DC
  Administered 2021-11-09 – 2021-11-14 (×11): 2 via RESPIRATORY_TRACT
  Filled 2021-11-07 (×2): qty 8.8

## 2021-11-07 MED ORDER — CALCITRIOL 0.25 MCG PO CAPS
0.2500 ug | ORAL_CAPSULE | Freq: Every day | ORAL | Status: DC
  Administered 2021-11-07 – 2021-11-14 (×8): 0.25 ug via ORAL
  Filled 2021-11-07 (×8): qty 1

## 2021-11-07 MED ORDER — FINASTERIDE 5 MG PO TABS
5.0000 mg | ORAL_TABLET | Freq: Every day | ORAL | Status: DC
  Administered 2021-11-07 – 2021-11-14 (×8): 5 mg via ORAL
  Filled 2021-11-07 (×8): qty 1

## 2021-11-07 MED ORDER — B COMPLEX-C PO TABS
1.0000 | ORAL_TABLET | Freq: Every day | ORAL | Status: DC
  Administered 2021-11-07 – 2021-11-14 (×8): 1 via ORAL
  Filled 2021-11-07 (×8): qty 1

## 2021-11-07 MED ORDER — VITAMIN D 25 MCG (1000 UNIT) PO TABS
1000.0000 [IU] | ORAL_TABLET | Freq: Every day | ORAL | Status: DC
  Administered 2021-11-07 – 2021-11-14 (×8): 1000 [IU] via ORAL
  Filled 2021-11-07 (×8): qty 1

## 2021-11-07 MED ORDER — POTASSIUM CHLORIDE CRYS ER 20 MEQ PO TBCR
20.0000 meq | EXTENDED_RELEASE_TABLET | Freq: Once | ORAL | Status: AC
  Administered 2021-11-07: 20 meq via ORAL
  Filled 2021-11-07: qty 1

## 2021-11-07 MED ORDER — SODIUM CHLORIDE 0.9 % IV SOLN: INTRAVENOUS | Status: DC

## 2021-11-07 MED ORDER — POTASSIUM CHLORIDE 10 MEQ/100ML IV SOLN
10.0000 meq | Freq: Once | INTRAVENOUS | Status: AC
  Administered 2021-11-07: 10 meq via INTRAVENOUS
  Filled 2021-11-07: qty 100

## 2021-11-07 MED ORDER — CITALOPRAM HYDROBROMIDE 10 MG PO TABS
10.0000 mg | ORAL_TABLET | Freq: Every day | ORAL | Status: DC
  Administered 2021-11-07 – 2021-11-14 (×8): 10 mg via ORAL
  Filled 2021-11-07 (×8): qty 1

## 2021-11-07 MED ORDER — UMECLIDINIUM BROMIDE 62.5 MCG/ACT IN AEPB
1.0000 | INHALATION_SPRAY | Freq: Every day | RESPIRATORY_TRACT | Status: DC
  Administered 2021-11-09: 09:00:00 1 via RESPIRATORY_TRACT
  Filled 2021-11-07: qty 7

## 2021-11-07 MED ORDER — CARVEDILOL 25 MG PO TABS
25.0000 mg | ORAL_TABLET | Freq: Two times a day (BID) | ORAL | Status: DC
  Administered 2021-11-07 – 2021-11-14 (×14): 25 mg via ORAL
  Filled 2021-11-07 (×5): qty 1
  Filled 2021-11-07: qty 2
  Filled 2021-11-07 (×4): qty 1
  Filled 2021-11-07: qty 2
  Filled 2021-11-07 (×3): qty 1

## 2021-11-07 MED ORDER — FERROUS SULFATE 325 (65 FE) MG PO TABS
324.0000 mg | ORAL_TABLET | Freq: Every day | ORAL | Status: DC
  Administered 2021-11-07 – 2021-11-08 (×2): 324 mg via ORAL
  Filled 2021-11-07 (×4): qty 1

## 2021-11-07 MED ORDER — VITAMIN B COMPLEX PO TABS: ORAL_TABLET | Freq: Every day | ORAL | Status: DC

## 2021-11-07 MED ORDER — TRAZODONE HCL 100 MG PO TABS
100.0000 mg | ORAL_TABLET | Freq: Every day | ORAL | Status: DC
  Administered 2021-11-07 – 2021-11-13 (×7): 100 mg via ORAL
  Filled 2021-11-07 (×2): qty 1
  Filled 2021-11-07: qty 2
  Filled 2021-11-07 (×4): qty 1

## 2021-11-07 MED ORDER — HEPARIN BOLUS VIA INFUSION
4000.0000 [IU] | Freq: Once | INTRAVENOUS | Status: AC
  Administered 2021-11-07: 4000 [IU] via INTRAVENOUS
  Filled 2021-11-07: qty 4000

## 2021-11-07 MED ORDER — NITROGLYCERIN 0.4 MG/SPRAY TL SOLN
1.0000 | Status: DC | PRN
  Filled 2021-11-07: qty 12

## 2021-11-07 MED ORDER — HEPARIN (PORCINE) 25000 UT/250ML-% IV SOLN
1000.0000 [IU]/h | INTRAVENOUS | Status: DC
  Administered 2021-11-07 – 2021-11-08 (×2): 1100 [IU]/h via INTRAVENOUS
  Administered 2021-11-09: 17:00:00 1000 [IU]/h via INTRAVENOUS
  Filled 2021-11-07 (×3): qty 250

## 2021-11-07 MED ORDER — INSULIN ASPART 100 UNIT/ML IJ SOLN
0.0000 [IU] | Freq: Three times a day (TID) | INTRAMUSCULAR | Status: DC
  Administered 2021-11-08: 5 [IU] via SUBCUTANEOUS
  Administered 2021-11-08 (×2): 3 [IU] via SUBCUTANEOUS

## 2021-11-07 MED ORDER — SODIUM CHLORIDE 0.9 % IV SOLN: Freq: Once | INTRAVENOUS | Status: AC

## 2021-11-07 MED ORDER — ACETAMINOPHEN 325 MG PO TABS: 650.0000 mg | ORAL_TABLET | Freq: Four times a day (QID) | ORAL | Status: DC | PRN

## 2021-11-07 MED ORDER — PANTOPRAZOLE SODIUM 40 MG PO TBEC
40.0000 mg | DELAYED_RELEASE_TABLET | Freq: Every day | ORAL | Status: DC
  Administered 2021-11-07 – 2021-11-14 (×8): 40 mg via ORAL
  Filled 2021-11-07 (×8): qty 1

## 2021-11-07 MED ORDER — SODIUM CHLORIDE 0.9 % IV BOLUS
250.0000 mL | Freq: Once | INTRAVENOUS | Status: AC
  Administered 2021-11-07: 250 mL via INTRAVENOUS

## 2021-11-07 MED ORDER — ACETAMINOPHEN 650 MG RE SUPP: 650.0000 mg | Freq: Four times a day (QID) | RECTAL | Status: DC | PRN

## 2021-11-07 MED ORDER — ADULT MULTIVITAMIN W/MINERALS CH
ORAL_TABLET | Freq: Every day | ORAL | Status: DC
  Administered 2021-11-07 – 2021-11-14 (×8): 1 via ORAL
  Filled 2021-11-07 (×8): qty 1

## 2021-11-07 MED ORDER — ISOSORBIDE MONONITRATE ER 60 MG PO TB24
60.0000 mg | ORAL_TABLET | Freq: Every day | ORAL | Status: DC
  Administered 2021-11-07 – 2021-11-12 (×6): 60 mg via ORAL
  Filled 2021-11-07 (×3): qty 1
  Filled 2021-11-07: qty 2
  Filled 2021-11-07: qty 1
  Filled 2021-11-07: qty 2

## 2021-11-07 MED ORDER — ROSUVASTATIN CALCIUM 5 MG PO TABS
10.0000 mg | ORAL_TABLET | Freq: Every day | ORAL | Status: DC
  Administered 2021-11-07 – 2021-11-14 (×8): 10 mg via ORAL
  Filled 2021-11-07 (×8): qty 2

## 2021-11-07 MED ORDER — CLOPIDOGREL BISULFATE 75 MG PO TABS
75.0000 mg | ORAL_TABLET | Freq: Every day | ORAL | Status: DC
  Administered 2021-11-07 – 2021-11-14 (×8): 75 mg via ORAL
  Filled 2021-11-07 (×8): qty 1

## 2021-11-07 MED ORDER — TIOTROPIUM BROMIDE MONOHYDRATE 2.5 MCG/ACT IN AERS: 1.0000 | INHALATION_SPRAY | Freq: Every day | RESPIRATORY_TRACT | Status: DC

## 2021-11-07 MED ORDER — POTASSIUM CHLORIDE CRYS ER 10 MEQ PO TBCR
10.0000 meq | EXTENDED_RELEASE_TABLET | Freq: Every day | ORAL | Status: DC
  Administered 2021-11-08: 10 meq via ORAL
  Filled 2021-11-07: qty 1

## 2021-11-07 NOTE — ED Notes (Addendum)
Critical potassium result received from lab. Charge RN Mykenzie C. Notified of need for room.
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
## 2021-11-07 NOTE — ED Provider Notes (Signed)
Emergency Medicine Provider Triage Evaluation Note  [name]. , a 80 y.o. male  was evaluated in triage.  Pt complains of chest pain and shortness of breath starting about 830 this morning.  Patient states that the pain is in the middle of his chest, and radiates to his back and his right neck.  He rates the pain a 6 out of 10 and describes it as sharp.  EMS gave patient 324 mg of aspirin and 0.4 sublingual nitroglycerin without a change in pain.  Of note patient has history of multiple myocardial infarctions in the past.  He also states that he was diagnosed with pneumonia on Friday, and is on antibiotics and steroids for this.  Patient was noted to be in atrial fibrillation with EMS, with heart rates from 80-152.  Review of Systems  Positive: Chest pain, shortness of breath, dizziness, lightheadedness, nausea, decreased appetite Negative: Fevers, chills, cough, congestion, vomiting  Physical Exam  BP (!) 142/73 (BP Location: Left Arm)   Pulse 72   Temp 98.3 F (36.8 C) (Oral)   Resp 20   Ht 5\' 3"  (1.6 m)   Wt 89.8 kg   SpO2 98%   BMI 35.07 kg/m  Gen:   Awake, no distress   Resp:  Normal effort  MSK:   Moves extremities without difficulty  Other:  EKG in triage shows afib with rates in 120s  Medical Decision Making  Medically screening exam initiated at 12:13 PM.  Appropriate orders placed.  [name]. was informed that the remainder of the evaluation will be completed by another provider, this initial triage assessment does not replace that evaluation, and the importance of remaining in the ED until their evaluation is complete.     Estill Cotta 11/07/21 1218    Valarie Merino, MD 11/11/21 [phone]

## 2021-11-07 NOTE — ED Triage Notes (Signed)
Pt arrives via [facility] for SOB/CP since this morning. Pt states pain is midsternal, with radiation to back. Rated 6/10 and sharp. Pt administered 324 mg ASA PTA. EMS adminsitered 0.4 SL Nitro without change in pain. Pt has hx of multiple MI's in the past. Pt recently diagnosed with PNA on Friday. Pt noted to be in Atrial fib with EMS with HR 80-152  #22 L AC with EMS with 150 mL NS  EMS last VS - CBG 362, HR 130, 128/82
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
## 2021-11-08 ENCOUNTER — Observation Stay (HOSPITAL_COMMUNITY): Payer: Medicare Other

## 2021-11-08 DIAGNOSIS — J42 Unspecified chronic bronchitis: Secondary | ICD-10-CM | POA: Diagnosis present

## 2021-11-08 DIAGNOSIS — E118 Type 2 diabetes mellitus with unspecified complications: Secondary | ICD-10-CM | POA: Diagnosis not present

## 2021-11-08 DIAGNOSIS — I4891 Unspecified atrial fibrillation: Secondary | ICD-10-CM | POA: Diagnosis present

## 2021-11-08 DIAGNOSIS — Z8616 Personal history of COVID-19: Secondary | ICD-10-CM | POA: Diagnosis not present

## 2021-11-08 DIAGNOSIS — J9811 Atelectasis: Secondary | ICD-10-CM | POA: Diagnosis present

## 2021-11-08 DIAGNOSIS — I2510 Atherosclerotic heart disease of native coronary artery without angina pectoris: Secondary | ICD-10-CM | POA: Diagnosis not present

## 2021-11-08 DIAGNOSIS — I25118 Atherosclerotic heart disease of native coronary artery with other forms of angina pectoris: Secondary | ICD-10-CM | POA: Diagnosis not present

## 2021-11-08 DIAGNOSIS — R778 Other specified abnormalities of plasma proteins: Secondary | ICD-10-CM | POA: Diagnosis not present

## 2021-11-08 DIAGNOSIS — I5032 Chronic diastolic (congestive) heart failure: Secondary | ICD-10-CM

## 2021-11-08 DIAGNOSIS — J9612 Chronic respiratory failure with hypercapnia: Secondary | ICD-10-CM | POA: Diagnosis present

## 2021-11-08 DIAGNOSIS — F32A Depression, unspecified: Secondary | ICD-10-CM | POA: Diagnosis present

## 2021-11-08 DIAGNOSIS — N1832 Chronic kidney disease, stage 3b: Secondary | ICD-10-CM | POA: Diagnosis present

## 2021-11-08 DIAGNOSIS — Y831 Surgical operation with implant of artificial internal device as the cause of abnormal reaction of the patient, or of later complication, without mention of misadventure at the time of the procedure: Secondary | ICD-10-CM | POA: Diagnosis present

## 2021-11-08 DIAGNOSIS — I130 Hypertensive heart and chronic kidney disease with heart failure and stage 1 through stage 4 chronic kidney disease, or unspecified chronic kidney disease: Secondary | ICD-10-CM | POA: Diagnosis present

## 2021-11-08 DIAGNOSIS — I4819 Other persistent atrial fibrillation: Secondary | ICD-10-CM | POA: Diagnosis not present

## 2021-11-08 DIAGNOSIS — E872 Acidosis, unspecified: Secondary | ICD-10-CM | POA: Diagnosis present

## 2021-11-08 DIAGNOSIS — R7989 Other specified abnormal findings of blood chemistry: Secondary | ICD-10-CM

## 2021-11-08 DIAGNOSIS — E785 Hyperlipidemia, unspecified: Secondary | ICD-10-CM | POA: Diagnosis not present

## 2021-11-08 DIAGNOSIS — D509 Iron deficiency anemia, unspecified: Secondary | ICD-10-CM | POA: Diagnosis present

## 2021-11-08 DIAGNOSIS — E1165 Type 2 diabetes mellitus with hyperglycemia: Secondary | ICD-10-CM | POA: Diagnosis present

## 2021-11-08 DIAGNOSIS — D631 Anemia in chronic kidney disease: Secondary | ICD-10-CM | POA: Diagnosis present

## 2021-11-08 DIAGNOSIS — E1169 Type 2 diabetes mellitus with other specified complication: Secondary | ICD-10-CM | POA: Diagnosis present

## 2021-11-08 DIAGNOSIS — I480 Paroxysmal atrial fibrillation: Secondary | ICD-10-CM | POA: Diagnosis not present

## 2021-11-08 DIAGNOSIS — Z6835 Body mass index (BMI) 35.0-35.9, adult: Secondary | ICD-10-CM | POA: Diagnosis not present

## 2021-11-08 DIAGNOSIS — J9611 Chronic respiratory failure with hypoxia: Secondary | ICD-10-CM

## 2021-11-08 DIAGNOSIS — E1151 Type 2 diabetes mellitus with diabetic peripheral angiopathy without gangrene: Secondary | ICD-10-CM | POA: Diagnosis present

## 2021-11-08 DIAGNOSIS — I214 Non-ST elevation (NSTEMI) myocardial infarction: Secondary | ICD-10-CM | POA: Diagnosis present

## 2021-11-08 DIAGNOSIS — U071 COVID-19: Secondary | ICD-10-CM | POA: Diagnosis present

## 2021-11-08 DIAGNOSIS — J439 Emphysema, unspecified: Secondary | ICD-10-CM | POA: Diagnosis not present

## 2021-11-08 DIAGNOSIS — J189 Pneumonia, unspecified organism: Secondary | ICD-10-CM | POA: Diagnosis not present

## 2021-11-08 DIAGNOSIS — E1122 Type 2 diabetes mellitus with diabetic chronic kidney disease: Secondary | ICD-10-CM | POA: Diagnosis present

## 2021-11-08 DIAGNOSIS — I5043 Acute on chronic combined systolic (congestive) and diastolic (congestive) heart failure: Secondary | ICD-10-CM | POA: Diagnosis present

## 2021-11-08 DIAGNOSIS — R0602 Shortness of breath: Secondary | ICD-10-CM | POA: Diagnosis not present

## 2021-11-08 DIAGNOSIS — J449 Chronic obstructive pulmonary disease, unspecified: Secondary | ICD-10-CM | POA: Diagnosis not present

## 2021-11-08 DIAGNOSIS — I517 Cardiomegaly: Secondary | ICD-10-CM | POA: Diagnosis not present

## 2021-11-08 DIAGNOSIS — E669 Obesity, unspecified: Secondary | ICD-10-CM | POA: Diagnosis present

## 2021-11-08 DIAGNOSIS — I69354 Hemiplegia and hemiparesis following cerebral infarction affecting left non-dominant side: Secondary | ICD-10-CM | POA: Diagnosis not present

## 2021-11-08 DIAGNOSIS — E440 Moderate protein-calorie malnutrition: Secondary | ICD-10-CM | POA: Diagnosis present

## 2021-11-08 DIAGNOSIS — N179 Acute kidney failure, unspecified: Secondary | ICD-10-CM | POA: Diagnosis present

## 2021-11-08 DIAGNOSIS — J90 Pleural effusion, not elsewhere classified: Secondary | ICD-10-CM | POA: Diagnosis not present

## 2021-11-08 DIAGNOSIS — R079 Chest pain, unspecified: Secondary | ICD-10-CM | POA: Diagnosis not present

## 2021-11-08 DIAGNOSIS — I10 Essential (primary) hypertension: Secondary | ICD-10-CM | POA: Diagnosis not present

## 2021-11-08 LAB — BASIC METABOLIC PANEL
Anion gap: 13 (ref 5–15)
BUN: 94 mg/dL — ABNORMAL HIGH (ref 8–23)
CO2: 33 mmol/L — ABNORMAL HIGH (ref 22–32)
Calcium: 9.2 mg/dL (ref 8.9–10.3)
Chloride: 85 mmol/L — ABNORMAL LOW (ref 98–111)
Creatinine, Ser: 2.45 mg/dL — ABNORMAL HIGH (ref 0.61–1.24)
GFR, Estimated: 26 mL/min — ABNORMAL LOW (ref >60)
Glucose, Bld: 206 mg/dL — ABNORMAL HIGH (ref 70–99)
Potassium: 3 mmol/L — ABNORMAL LOW (ref 3.5–5.1)
Sodium: 131 mmol/L — ABNORMAL LOW (ref 135–145)

## 2021-11-08 LAB — CBG MONITORING, ED
Glucose-Capillary: 214 mg/dL — ABNORMAL HIGH (ref 70–99)
Glucose-Capillary: 239 mg/dL — ABNORMAL HIGH (ref 70–99)

## 2021-11-08 LAB — CBC
HCT: 39.4 % (ref 39.0–52.0)
Hemoglobin: 12.8 g/dL — ABNORMAL LOW (ref 13.0–17.0)
MCH: 29.7 pg (ref 26.0–34.0)
MCHC: 32.5 g/dL (ref 30.0–36.0)
MCV: 91.4 fL (ref 80.0–100.0)
Platelets: 306 10*3/uL (ref 150–400)
RBC: 4.31 MIL/uL (ref 4.22–5.81)
RDW: 13.7 % (ref 11.5–15.5)
WBC: 9.9 10*3/uL (ref 4.0–10.5)
nRBC: 0 % (ref 0.0–0.2)

## 2021-11-08 LAB — HEPATIC FUNCTION PANEL
ALT: 24 U/L (ref 0–44)
AST: 28 U/L (ref 15–41)
Albumin: 2.7 g/dL — ABNORMAL LOW (ref 3.5–5.0)
Alkaline Phosphatase: 61 U/L (ref 38–126)
Bilirubin, Direct: <0.1 mg/dL (ref 0.0–0.2)
Total Bilirubin: 0.6 mg/dL (ref 0.3–1.2)
Total Protein: 5.7 g/dL — ABNORMAL LOW (ref 6.5–8.1)

## 2021-11-08 LAB — GLUCOSE, CAPILLARY
Glucose-Capillary: 262 mg/dL — ABNORMAL HIGH (ref 70–99)
Glucose-Capillary: 298 mg/dL — ABNORMAL HIGH (ref 70–99)
Glucose-Capillary: 240 mg/dL — ABNORMAL HIGH (ref 70–99)

## 2021-11-08 LAB — TROPONIN I (HIGH SENSITIVITY): Troponin I (High Sensitivity): 3932 ng/L (ref <18)

## 2021-11-08 LAB — PROTIME-INR
INR: 1.2 (ref 0.8–1.2)
Prothrombin Time: 14.9 seconds (ref 11.4–15.2)

## 2021-11-08 LAB — ECHOCARDIOGRAM COMPLETE
Area-P 1/2: 3.92 cm2
Calc EF: 60.6 %
Height: 63 in
S' Lateral: 3.8 cm
Single Plane A2C EF: 57.3 %
Single Plane A4C EF: 65.3 %
Weight: 3168 oz

## 2021-11-08 LAB — HEPARIN LEVEL (UNFRACTIONATED)
Heparin Unfractionated: 0.36 IU/mL (ref 0.30–0.70)
Heparin Unfractionated: 0.48 IU/mL (ref 0.30–0.70)

## 2021-11-08 MED ORDER — SODIUM CHLORIDE 0.9 % IV SOLN
INTRAVENOUS | Status: DC
Start: 2021-11-08 — End: 2021-11-09

## 2021-11-08 MED ORDER — POTASSIUM CHLORIDE CRYS ER 20 MEQ PO TBCR
40.0000 meq | EXTENDED_RELEASE_TABLET | Freq: Once | ORAL | Status: AC
  Administered 2021-11-08: 40 meq via ORAL
  Filled 2021-11-08: qty 2

## 2021-11-08 MED ORDER — SODIUM CHLORIDE 0.9% FLUSH
3.0000 mL | Freq: Two times a day (BID) | INTRAVENOUS | Status: DC
  Administered 2021-11-08 – 2021-11-09 (×2): 3 mL via INTRAVENOUS

## 2021-11-08 MED ORDER — INSULIN ASPART 100 UNIT/ML IJ SOLN
0.0000 [IU] | Freq: Three times a day (TID) | INTRAMUSCULAR | Status: DC
  Administered 2021-11-08: 5 [IU] via SUBCUTANEOUS
  Administered 2021-11-09: 10:00:00 3 [IU] via SUBCUTANEOUS

## 2021-11-08 NOTE — Progress Notes (Signed)
Subjective:  No chest pain   Objective:  Vitals:   11/08/21 0645 11/08/21 0815 11/08/21 0835 11/08/21 0925  BP: 140/67 131/77  122/73  Pulse: 61 (!) 105 100 68  Resp: 16 13 18 17   Temp:      TempSrc:      SpO2: 99% 99% 99% 100%  Weight:      Height:        Intake/Output from previous day:  Intake/Output Summary (Last 24 hours) at 11/08/2021 0955 Last data filed at 11/08/2021 0328 Gross per 24 hour  Intake --  Output 1200 ml  Net -1200 ml    Physical Exam: Elderly male  Lungs clear No murmur  Abdomen benign LE lymphedema but dry and tenting   Lab Results: Basic Metabolic Panel: Recent Labs    11/07/21 1628 11/08/21 0339  NA 132* 131*  K 3.3* 3.0*  CL 82* 85*  CO2 34* 33*  GLUCOSE 111* 206*  BUN 98* 94*  CREATININE 2.69* 2.45*  CALCIUM 9.5 9.2  MG 2.5*  --    Liver Function Tests: Recent Labs    11/08/21 0339  AST 28  ALT 24  ALKPHOS 61  BILITOT 0.6  PROT 5.7*  ALBUMIN 2.7*   No results for input(s): LIPASE, AMYLASE in the last 72 hours. CBC: Recent Labs    11/07/21 1223 11/08/21 0339  WBC 11.0* 9.9  HGB 14.3 12.8*  HCT 42.8 39.4  MCV 90.7 91.4  PLT 412* 306   Thyroid Function Tests: Recent Labs    11/07/21 1628  TSH 2.937   Anemia Panel: No results for input(s): VITAMINB12, FOLATE, FERRITIN, TIBC, IRON, RETICCTPCT in the last 72 hours.  Imaging: DG Chest 2 View  Result Date: 11/07/2021 CLINICAL DATA:  Chest pain EXAM: CHEST - 2 VIEW COMPARISON:  None. FINDINGS: Cardiac and mediastinal contours are unchanged. Prominence of the right cardiac contour is unchanged compared to multiple priors and likely due to prominent pericardial fat. No large pleural effusion or pneumothorax. Mild bibasilar opacities, likely due to bibasilar atelectasis. IMPRESSION: Mild bibasilar opacities, likely due to atelectasis. Electronically Signed   By: Yetta Glassman M.D.   On: 11/07/2021 12:48    Cardiac Studies:  ECG: afib rate 122 nonspecific ST  changes    Telemetry:  afib rate ok   Echo: pending   Medications:    B-complex with vitamin C  1 tablet Oral Daily   calcitRIOL  0.25 mcg Oral Daily   carvedilol  25 mg Oral BID WC   cholecalciferol  1,000 Units Oral Daily   citalopram  10 mg Oral Daily   clopidogrel  75 mg Oral Daily   ferrous sulfate  324 mg Oral Q1400   finasteride  5 mg Oral Daily   insulin aspart  0-9 Units Subcutaneous TID WC   isosorbide mononitrate  60 mg Oral Daily   mometasone-formoterol  2 puff Inhalation BID   multivitamin with minerals   Oral Daily   pantoprazole  40 mg Oral Daily   potassium chloride  10 mEq Oral Daily   rosuvastatin  10 mg Oral Daily   sodium chloride flush  3 mL Intravenous Q12H   traZODone  100 mg Oral QHS   umeclidinium bromide  1 puff Inhalation Daily      heparin 1,100 Units/hr (11/08/21 0144)    Assessment/Plan:   NSTEMI:  continue heparin ASA, imdur and statin troponin peak 3932.  Chest pain resolved History of total RCA with collaterals Change  to monoRx with plavix for now on heparin Tentatively scheduled for cath Friday hoping that Cr and azotemia improves TTE this am  Afib:  rate ok on coreg transition to [place] post cath  Azotemia:  patient self adjusted lasix and zaroxyln for LE lymphedema Hydrate hold diuretics follow Cr   Jenkins Rouge 11/08/2021, 9:55 AM
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
## 2021-11-08 NOTE — Progress Notes (Signed)
PROGRESS NOTE    [name].  KJZ:[id number] DOB: 09/05/1941 DOA: 11/07/2021 PCP: Haydee Salter, MD    Brief Narrative:  Mr. Sturgill was admitted to the hospital with the working diagnosis of NEW onset atrial fibrillation.   80 year old male past medical history for coronary disease, COPD, chronic hypoxic respiratory failure, hypertension, chronic kidney disease, dyslipidemia, peripheral vascular disease, and type 2 diabetes mellitus who presented with chest pain.  He had acute onset of chest pain that woke him up from sleep.  8/10 intensity, sharp in nature, constant, associated with diaphoresis and dyspnea.  Because of severe symptoms he came to the hospital for further evaluation.  10/16/2021 diagnosed with COVID-19, received treatment with Paxlovid   Recent outpatient evaluation he was diagnosed with pneumonia, placed on levofloxacin and steroids.  The dose of his diuretic therapy was increased. On his initial physical examination his blood pressure was 142/73, heart rate 72, respirate 20, oxygen saturation 98%, he was afebrile.  His lungs are clear to auscultation bilaterally, no wheezing, rales or rhonchi, heart S1-S2, irregularly irregular, abdomen soft, positive trace lower extremity edema.  Sodium 132, potassium 3.3, chloride 82, bicarb 34, glucose 111, BUN 98, creatinine 2.69, BNP 197, high sensitive troponin [id number] SARS COVID-19 positive.  Chest radiograph with bibasilar atelectasis.  EKG 122 bpm, normal axis, normal intervals, atrial fibrillation rhythm, no significant ST segment or T wave changes.  Patient was placed on carvedilol for rate control and anticoagulation with heparin.   Assessment & Plan:   Principal Problem:   Atrial fibrillation (HCC) Active Problems:   Controlled diabetes mellitus type 2 with complications (HCC)   Dyslipidemia   Hypokalemia   Iron deficiency anemia   Essential hypertension   Coronary artery disease   Chronic diastolic  heart failure (HCC)   Hemiplegia, 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HCC)   Elevated troponin   Chronic respiratory failure with hypoxia and hypercapnia (HCC)   COPD with chronic bronchitis (HCC)   History of stroke   Acute renal failure superimposed on stage 3b chronic kidney disease (HCC)   Increased anion gap metabolic acidosis   New onset atrial fibrillation/ chronic diastolic heart failure.  Improved rated control with carvedilol, continue anticoagulation with heparin. Echocardiogram with preserved LV systolic function with EF 55 to 60%, no well motion abnormalities.  Elevated troponin possible demand ischemia.   2. AKI on CKD stage 3b/ hypokalemia/ hyponatremia renal function with serum cr at 2,45 with K at 3,0 and Na 131. Patient at home on diuretic therapy and poor oral intake.  Plan to continue IV fluids with isotonic saline at 50 ml per hr and follow up renal function in am, avoid hypotension and nephrotoxic medications,   3. COPD  no sings of exacerbation, continue oxymetry monitoring. Pneumonia has been ruled out. Patient tested positive for COVID 19, but had a positive test on 10/26, no signs of acute viral illness, no isolation.   4. HTN continue blood pressure monitoring   5. T2DM/ dyslipidemia  hyperglycemia, with fasting glucose at 206, continue with insulin sliding scale for glucose cover and monitoring   6. Hx of CVA. Continue blood pressure monitoring   7. Anemia of chronic disease with iron deficiency.  Cell count stable with Hgb at 12,8 and Hct at 39,4  8. Gout.  No acute exacerbation   Patient continue to be at high risk for worsening atrial fibrillation and renal failure   Status is: Inpatient  Remains inpatient appropriate because: Iv anticoagulation and telemetry  monitoring        DVT prophylaxis:  Heparin   Code Status:    full  Family Communication:   No family at the bedside     Consultants:  Cardiology    Subjective: Patient  continue to be fatigued but no chest pain or dyspnea, no nausea or vomiting.    Objective: Vitals:   11/08/21 1215 11/08/21 1245 11/08/21 1500 11/08/21 1501  BP:  121/86 117/76   Pulse: 83 81 91   Resp: 13 15 16    Temp:   97.8 F (36.6 C) (!) 97.5 F (36.4 C)  TempSrc:   Oral Oral  SpO2: 100% 99% 98%   Weight:      Height:        Intake/Output Summary (Last 24 hours) at 11/08/2021 1635 Last data filed at 11/08/2021 1510 Gross per 24 hour  Intake --  Output 1500 ml  Net -1500 ml   Filed Weights   11/07/21 1207  Weight: 89.8 kg    Examination:   General: Not in pain or dyspnea, deconditioned  Neurology: Awake and alert, non focal  E ENT: no pallor, no icterus, oral mucosa moist Cardiovascular: No JVD. S1-S2 present, irregularly irregular, no gallops, rubs, or murmurs. No lower extremity edema. Pulmonary:positive breath sounds bilaterally, with no wheezing, or rhonchi scattered rales. Gastrointestinal. Abdomen soft and non tender Skin. No rashes Musculoskeletal: no joint deformities     Data Reviewed: I have personally reviewed following labs and imaging studies  CBC: Recent Labs  Lab 11/03/21 1220 11/07/21 1223 11/08/21 0339  WBC 6.4 11.0* 9.9  HGB 11.9* 14.3 12.8*  HCT 36.0* 42.8 39.4  MCV 93.5 90.7 91.4  PLT 313.0 412* 703   Basic Metabolic Panel: Recent Labs  Lab 11/03/21 1220 11/07/21 1223 11/07/21 1628 11/08/21 0339  NA 137 130* 132* 131*  K 4.5 2.7* 3.3* 3.0*  CL 99 76* 82* 85*  CO2 32 35* 34* 33*  GLUCOSE 170* 259* 111* 206*  BUN 43* 93* 98* 94*  CREATININE 1.49 2.65* 2.69* 2.45*  CALCIUM 9.2 9.7 9.5 9.2  MG  --   --  2.5*  --    GFR: Estimated Creatinine Clearance: 24.2 mL/min (A) (by C-G formula based on SCr of 2.45 mg/dL (H)). Liver Function Tests: Recent Labs  Lab 11/08/21 0339  AST 28  ALT 24  ALKPHOS 61  BILITOT 0.6  PROT 5.7*  ALBUMIN 2.7*   No results for input(s): LIPASE, AMYLASE in the last 168 hours. No results  for input(s): AMMONIA in the last 168 hours. Coagulation Profile: Recent Labs  Lab 11/08/21 0207  INR 1.2   Cardiac Enzymes: No results for input(s): CKTOTAL, CKMB, CKMBINDEX, TROPONINI in the last 168 hours. BNP (last 3 results) Recent Labs    11/03/21 1220  PROBNP 168.0*   HbA1C: No results for input(s): HGBA1C in the last 72 hours. CBG: Recent Labs  Lab 11/07/21 1352 11/07/21 2158 11/08/21 0848 11/08/21 1301  GLUCAP 196* 284* 214* 239*   Lipid Profile: No results for input(s): CHOL, HDL, LDLCALC, TRIG, CHOLHDL, LDLDIRECT in the last 72 hours. Thyroid Function Tests: Recent Labs    11/07/21 1628  TSH 2.937   Anemia Panel: No results for input(s): VITAMINB12, FOLATE, FERRITIN, TIBC, IRON, RETICCTPCT in the last 72 hours.    Radiology Studies: I have reviewed all of the imaging during this hospital visit personally     Scheduled Meds:  B-complex with vitamin C  1 tablet Oral Daily  calcitRIOL  0.25 mcg Oral Daily   carvedilol  25 mg Oral BID WC   cholecalciferol  1,000 Units Oral Daily   citalopram  10 mg Oral Daily   clopidogrel  75 mg Oral Daily   ferrous sulfate  324 mg Oral Q1400   finasteride  5 mg Oral Daily   insulin aspart  0-9 Units Subcutaneous TID WC   isosorbide mononitrate  60 mg Oral Daily   mometasone-formoterol  2 puff Inhalation BID   multivitamin with minerals   Oral Daily   pantoprazole  40 mg Oral Daily   potassium chloride  10 mEq Oral Daily   rosuvastatin  10 mg Oral Daily   sodium chloride flush  3 mL Intravenous Q12H   traZODone  100 mg Oral QHS   umeclidinium bromide  1 puff Inhalation Daily   Continuous Infusions:  heparin 1,100 Units/hr (11/08/21 1508)     LOS: 0 days        [name] Gerome Apley, MD

## 2021-11-08 NOTE — ED Notes (Signed)
ECHO in progress- 

## 2021-11-08 NOTE — Progress Notes (Signed)
  Echocardiogram 2D Echocardiogram has been performed.  [name] 11/08/2021, 3:02 PM

## 2021-11-08 NOTE — Progress Notes (Signed)
ANTICOAGULATION CONSULT NOTE - Follow Up Consult  Pharmacy Consult for heparin Indication:  Afib/CP  Labs: Recent Labs    11/07/21 1223 11/07/21 1413 11/07/21 1628 11/07/21 1730 11/08/21 0207  HGB 14.3  --   --   --   --   HCT 42.8  --   --   --   --   PLT 412*  --   --   --   --   HEPARINUNFRC  --   --   --   --  0.36  CREATININE 2.65*  --  2.69*  --   --   TROPONINIHS 30* 589*  --  2,957*  --     Assessment/Plan:  80yo male therapeutic on heparin with initial dosing for Afib and CP. Will continue infusion at current rate of 1100 units/hr and confirm stable with additional level.   Wynona Neat, PharmD, BCPS  11/08/2021,3:02 AM

## 2021-11-09 DIAGNOSIS — N179 Acute kidney failure, unspecified: Secondary | ICD-10-CM | POA: Diagnosis not present

## 2021-11-09 DIAGNOSIS — J9611 Chronic respiratory failure with hypoxia: Secondary | ICD-10-CM | POA: Diagnosis not present

## 2021-11-09 DIAGNOSIS — E785 Hyperlipidemia, unspecified: Secondary | ICD-10-CM

## 2021-11-09 DIAGNOSIS — I4819 Other persistent atrial fibrillation: Secondary | ICD-10-CM | POA: Diagnosis not present

## 2021-11-09 DIAGNOSIS — I2510 Atherosclerotic heart disease of native coronary artery without angina pectoris: Secondary | ICD-10-CM

## 2021-11-09 DIAGNOSIS — J449 Chronic obstructive pulmonary disease, unspecified: Secondary | ICD-10-CM

## 2021-11-09 DIAGNOSIS — I5032 Chronic diastolic (congestive) heart failure: Secondary | ICD-10-CM | POA: Diagnosis not present

## 2021-11-09 LAB — BASIC METABOLIC PANEL
Anion gap: 11 (ref 5–15)
BUN: 67 mg/dL — ABNORMAL HIGH (ref 8–23)
CO2: 36 mmol/L — ABNORMAL HIGH (ref 22–32)
Calcium: 10 mg/dL (ref 8.9–10.3)
Chloride: 87 mmol/L — ABNORMAL LOW (ref 98–111)
Creatinine, Ser: 1.76 mg/dL — ABNORMAL HIGH (ref 0.61–1.24)
GFR, Estimated: 39 mL/min — ABNORMAL LOW (ref >60)
Glucose, Bld: 189 mg/dL — ABNORMAL HIGH (ref 70–99)
Potassium: 3.2 mmol/L — ABNORMAL LOW (ref 3.5–5.1)
Sodium: 134 mmol/L — ABNORMAL LOW (ref 135–145)

## 2021-11-09 LAB — GLUCOSE, CAPILLARY
Glucose-Capillary: 221 mg/dL — ABNORMAL HIGH (ref 70–99)
Glucose-Capillary: 405 mg/dL — ABNORMAL HIGH (ref 70–99)
Glucose-Capillary: 306 mg/dL — ABNORMAL HIGH (ref 70–99)
Glucose-Capillary: 163 mg/dL — ABNORMAL HIGH (ref 70–99)
Glucose-Capillary: 267 mg/dL — ABNORMAL HIGH (ref 70–99)

## 2021-11-09 LAB — CBC
HCT: 41.3 % (ref 39.0–52.0)
Hemoglobin: 13.7 g/dL (ref 13.0–17.0)
MCH: 30.2 pg (ref 26.0–34.0)
MCHC: 33.2 g/dL (ref 30.0–36.0)
MCV: 91 fL (ref 80.0–100.0)
Platelets: 289 10*3/uL (ref 150–400)
RBC: 4.54 MIL/uL (ref 4.22–5.81)
RDW: 13.6 % (ref 11.5–15.5)
WBC: 10.4 10*3/uL (ref 4.0–10.5)
nRBC: 0 % (ref 0.0–0.2)

## 2021-11-09 LAB — HEPARIN LEVEL (UNFRACTIONATED)
Heparin Unfractionated: 0.82 IU/mL — ABNORMAL HIGH (ref 0.30–0.70)
Heparin Unfractionated: 0.29 IU/mL — ABNORMAL LOW (ref 0.30–0.70)
Heparin Unfractionated: 0.5 IU/mL (ref 0.30–0.70)

## 2021-11-09 LAB — GLUCOSE, RANDOM: Glucose, Bld: 181 mg/dL — ABNORMAL HIGH (ref 70–99)

## 2021-11-09 LAB — MAGNESIUM: Magnesium: 2.4 mg/dL (ref 1.7–2.4)

## 2021-11-09 MED ORDER — INSULIN ASPART 100 UNIT/ML IJ SOLN
0.0000 [IU] | Freq: Three times a day (TID) | INTRAMUSCULAR | Status: DC
  Administered 2021-11-10: 8 [IU] via SUBCUTANEOUS
  Administered 2021-11-10 (×2): 3 [IU] via SUBCUTANEOUS
  Administered 2021-11-11: 8 [IU] via SUBCUTANEOUS
  Administered 2021-11-11: 11 [IU] via SUBCUTANEOUS
  Administered 2021-11-11: 5 [IU] via SUBCUTANEOUS
  Administered 2021-11-11 – 2021-11-12 (×4): 8 [IU] via SUBCUTANEOUS
  Administered 2021-11-12: 5 [IU] via SUBCUTANEOUS
  Administered 2021-11-13: 3 [IU] via SUBCUTANEOUS
  Administered 2021-11-13: 5 [IU] via SUBCUTANEOUS
  Administered 2021-11-13: 8 [IU] via SUBCUTANEOUS
  Administered 2021-11-13 – 2021-11-14 (×2): 11 [IU] via SUBCUTANEOUS
  Administered 2021-11-14: 2 [IU] via SUBCUTANEOUS

## 2021-11-09 MED ORDER — FERROUS SULFATE 325 (65 FE) MG PO TABS
325.0000 mg | ORAL_TABLET | Freq: Every day | ORAL | Status: DC
  Administered 2021-11-09 – 2021-11-14 (×6): 325 mg via ORAL
  Filled 2021-11-09 (×5): qty 1

## 2021-11-09 MED ORDER — POTASSIUM CHLORIDE CRYS ER 20 MEQ PO TBCR
40.0000 meq | EXTENDED_RELEASE_TABLET | Freq: Once | ORAL | Status: AC
  Administered 2021-11-09: 10:00:00 40 meq via ORAL
  Filled 2021-11-09: qty 2

## 2021-11-09 MED ORDER — INSULIN ASPART 100 UNIT/ML IJ SOLN
15.0000 [IU] | Freq: Once | INTRAMUSCULAR | Status: AC
  Administered 2021-11-09: 13:00:00 15 [IU] via SUBCUTANEOUS

## 2021-11-09 MED ORDER — ASPIRIN 81 MG PO CHEW
81.0000 mg | CHEWABLE_TABLET | ORAL | Status: AC
  Administered 2021-11-10: 81 mg via ORAL
  Filled 2021-11-09: qty 1

## 2021-11-09 MED ORDER — GUAIFENESIN-DM 100-10 MG/5ML PO SYRP
5.0000 mL | ORAL_SOLUTION | ORAL | Status: DC | PRN
  Administered 2021-11-11 – 2021-11-13 (×2): 5 mL via ORAL
  Filled 2021-11-09 (×2): qty 5

## 2021-11-09 MED ORDER — SODIUM CHLORIDE 0.9 % WEIGHT BASED INFUSION
1.0000 mL/kg/h | INTRAVENOUS | Status: DC
  Administered 2021-11-10: 1 mL/kg/h via INTRAVENOUS

## 2021-11-09 MED ORDER — SODIUM CHLORIDE 0.9 % IV SOLN: 250.0000 mL | INTRAVENOUS | Status: DC | PRN

## 2021-11-09 MED ORDER — SODIUM CHLORIDE 0.9% FLUSH: 3.0000 mL | INTRAVENOUS | Status: DC | PRN

## 2021-11-09 MED ORDER — IPRATROPIUM-ALBUTEROL 0.5-2.5 (3) MG/3ML IN SOLN
3.0000 mL | Freq: Four times a day (QID) | RESPIRATORY_TRACT | Status: DC
  Administered 2021-11-09 – 2021-11-12 (×10): 3 mL via RESPIRATORY_TRACT
  Filled 2021-11-09 (×7): qty 3

## 2021-11-09 MED ORDER — SODIUM CHLORIDE 0.9 % WEIGHT BASED INFUSION
3.0000 mL/kg/h | INTRAVENOUS | Status: DC
  Administered 2021-11-10: 3 mL/kg/h via INTRAVENOUS

## 2021-11-09 MED ORDER — HYDROCOD POLST-CPM POLST ER 10-8 MG/5ML PO SUER
5.0000 mL | Freq: Two times a day (BID) | ORAL | Status: DC
  Administered 2021-11-09 – 2021-11-14 (×11): 5 mL via ORAL
  Filled 2021-11-09 (×11): qty 5

## 2021-11-09 MED ORDER — POTASSIUM CHLORIDE CRYS ER 20 MEQ PO TBCR: 40.0000 meq | EXTENDED_RELEASE_TABLET | Freq: Two times a day (BID) | ORAL | Status: DC

## 2021-11-09 MED ORDER — INSULIN ASPART 100 UNIT/ML IJ SOLN
0.0000 [IU] | Freq: Three times a day (TID) | INTRAMUSCULAR | Status: DC
  Administered 2021-11-09: 23:00:00 8 [IU] via SUBCUTANEOUS
  Administered 2021-11-09: 17:00:00 3 [IU] via SUBCUTANEOUS

## 2021-11-09 NOTE — Progress Notes (Signed)
Mobility Specialist Progress Note    11/09/21 1147  Mobility  Activity Contraindicated/medical hold   RN advised that pt is having a hard time breathing. Will f/u tomorrow.   Hildred Alamin Mobility Specialist  Mobility Specialist Phone: [phone]

## 2021-11-09 NOTE — Progress Notes (Signed)
[place] for Heparin Indication: chest pain/ACS and atrial fibrillation  Allergies  Allergen Reactions   Enalapril Maleate Cough    REACTION: cough   Lisinopril Cough   Shellfish-Derived Products Swelling    Said occurred twice; has eaten some since and had no reactions    Patient Measurements: Height: 5\' 3"  (160 cm) Weight: 90.4 kg (199 lb 6.4 oz) IBW/kg (Calculated) : 56.9 Heparin Dosing Weight: 76.7 kg  Vital Signs: Temp: 98.3 F (36.8 C) (11/17 2124) Temp Source: Oral (11/17 2124) BP: 125/71 (11/17 2124) Pulse Rate: 80 (11/17 2124)  Labs: Recent Labs    11/07/21 1223 11/07/21 1413 11/07/21 1628 11/07/21 1730 11/08/21 0207 11/08/21 0339 11/08/21 0809 11/09/21 0338 11/09/21 1422 11/09/21 2054  HGB 14.3  --   --   --   --  12.8*  --  13.7  --   --   HCT 42.8  --   --   --   --  39.4  --  41.3  --   --   PLT 412*  --   --   --   --  306  --  289  --   --   LABPROT  --   --   --   --  14.9  --   --   --   --   --   INR  --   --   --   --  1.2  --   --   --   --   --   HEPARINUNFRC  --   --   --   --  0.36  --    < > 0.82* 0.29* 0.50  CREATININE 2.65*  --  2.69*  --   --  2.45*  --  1.76*  --   --   TROPONINIHS 30* 589*  --  2,957*  --  3,932*  --   --   --   --    < > = values in this interval not displayed.     Estimated Creatinine Clearance: 33.8 mL/min (A) (by C-G formula based on SCr of 1.76 mg/dL (H)).   Assessment: 80 y.o. male with chest pain/Afib for heparin  Goal of Therapy:  Heparin level 0.3-0.7 units/ml Monitor platelets by anticoagulation protocol: Yes   Plan:  Continue Heparin at current rate   Phillis Knack, PharmD, BCPS  11/09/2021 11:19 PM

## 2021-11-09 NOTE — Evaluation (Signed)
Physical Therapy Evaluation Patient Details Name: [name]. MRN: [id number] DOB: Aug 16, 1941 Today's Date: 11/09/2021  History of Present Illness  80 y.o. male presents to [facility] hospital on 11/07/2021 with chest pain. Pt admitted for NSTEMI workup, also found to be in Afib. Pt with recent COVID and PNA diagnosis in October 2022. PMH includes coronary disease, COPD, chronic hypoxic respiratory failure, HTN, CKD, HLD, PVD, and DMII.  Clinical Impression  Pt presents to PT with deficits in functional mobility, activity tolerance, endurance, power, gait, balance. Pt is largely limited by fatigue, tolerating short bouts of mobility. Pt continues to experience coughing bouts throughout session. Pt will benefit from aggressive mobilization to improve LE strength and to restore independence in mobility. PT recommends discharge home with HHPT.       Recommendations for follow up therapy are one component of a multi-disciplinary discharge planning process, led by the attending physician.  Recommendations may be updated based on patient status, additional functional criteria and insurance authorization.  Follow Up Recommendations Home health PT (pt would prefer HHPT with transition to outpatient cadiac rehab)    Assistance Recommended at Discharge Intermittent Supervision/Assistance  Functional Status Assessment Patient has had a recent decline in their functional status and demonstrates the ability to make significant improvements in function in a reasonable and predictable amount of time.  Equipment Recommendations  None recommended by PT    Recommendations for Other Services       Precautions / Restrictions Precautions Precautions: Fall Precaution Comments: monitor SpO2 Restrictions Weight Bearing Restrictions: No      Mobility  Bed Mobility Overal bed mobility: Needs Assistance Bed Mobility: Supine to Sit     Supine to sit: Supervision          Transfers Overall transfer  level: Needs assistance Equipment used: Rolling walker (2 wheels) Transfers: Sit to/from Stand Sit to Stand: Supervision                Ambulation/Gait Ambulation/Gait assistance: Supervision Gait Distance (Feet): 50 Feet Assistive device: Rolling walker (2 wheels) Gait Pattern/deviations: Step-through pattern Gait velocity: reduced Gait velocity interpretation: <1.8 ft/sec, indicate of risk for recurrent falls   General Gait Details: pt with slowed step-through gait  Stairs            Wheelchair Mobility    Modified Rankin (Stroke Patients Only)       Balance Overall balance assessment: Needs assistance Sitting-balance support: No upper extremity supported;Feet supported Sitting balance-Leahy Scale: Good     Standing balance support: Reliant on assistive device for balance Standing balance-Leahy Scale: Poor                               Pertinent Vitals/Pain Pain Assessment: No/denies pain    Home Living Family/patient expects to be discharged to:: Private residence Living Arrangements: Spouse/significant other;Children (dtr with MS, pt and spouse are caregivers for dtr) Available Help at Discharge: Family;Available 24 hours/day Type of Home: House Home Access: Ramped entrance       Home Layout: One level Home Equipment: Conservation officer, nature (2 wheels);Wheelchair - manual;Shower seat      Prior Function Prior Level of Function : Independent/Modified Independent             Mobility Comments: pt reports utilizing walker for community distances, has been utilizing more often over the last month       Hand Dominance        Extremity/Trunk Assessment  Upper Extremity Assessment Upper Extremity Assessment: Overall WFL for tasks assessed    Lower Extremity Assessment Lower Extremity Assessment: Generalized weakness    Cervical / Trunk Assessment Cervical / Trunk Assessment: Normal  Communication   Communication: No  difficulties  Cognition Arousal/Alertness: Awake/alert Behavior During Therapy: WFL for tasks assessed/performed Overall Cognitive Status: Within Functional Limits for tasks assessed                                          General Comments General comments (skin integrity, edema, etc.): pt on 2LNC upon PT arrival, PT weans pt to room air with sats from 93-97%. Pt continues to have coughing spells. PT alerts nurse tech to supplemental oxygen requirements as RN busy in another room    Exercises     Assessment/Plan    PT Assessment Patient needs continued PT services  PT Problem List Decreased strength;Decreased activity tolerance;Decreased balance;Decreased mobility;Cardiopulmonary status limiting activity       PT Treatment Interventions DME instruction;Gait training;Functional mobility training;Therapeutic activities;Therapeutic exercise;Balance training;Neuromuscular re-education;Patient/family education    PT Goals (Current goals can be found in the Care Plan section)  Acute Rehab PT Goals Patient Stated Goal: to improve strength and restore independence PT Goal Formulation: With patient Time For Goal Achievement: 11/23/21 Potential to Achieve Goals: Good Additional Goals Additional Goal #1: Pt will reports 2/10 DOE or less when ambulating for >100' to indicate improved activity tolerance    Frequency Min 3X/week   Barriers to discharge        Co-evaluation               AM-PAC PT "6 Clicks" Mobility  Outcome Measure Help needed turning from your back to your side while in a flat bed without using bedrails?: A Little Help needed moving from lying on your back to sitting on the side of a flat bed without using bedrails?: A Little Help needed moving to and from a bed to a chair (including a wheelchair)?: A Little Help needed standing up from a chair using your arms (e.g., wheelchair or bedside chair)?: A Little Help needed to walk in hospital  room?: A Little Help needed climbing 3-5 steps with a railing? : A Lot 6 Click Score: 17    End of Session   Activity Tolerance: Patient limited by fatigue Patient left: in chair;with call bell/phone within reach Nurse Communication: Mobility status PT Visit Diagnosis: Other abnormalities of gait and mobility (R26.89);Muscle weakness (generalized) (M62.81)    Time: 2956-2130 PT Time Calculation (min) (ACUTE ONLY): 21 min   Charges:   PT Evaluation $PT Eval Low Complexity: [place], PT, DPT Acute Rehabilitation Pager: [phone] Office [phone]   Zenaida Niece 11/09/2021, 6:06 PM

## 2021-11-09 NOTE — Progress Notes (Addendum)
Progress Note  Patient Name: [name]. Date of Encounter: 11/09/2021  [facility] Cardiologist: Lauree Chandler, MD   Subjective   Feeling well. No chest pain, sob or palpitations.    Inpatient Medications    Scheduled Meds:  B-complex with vitamin C  1 tablet Oral Daily   calcitRIOL  0.25 mcg Oral Daily   carvedilol  25 mg Oral BID WC   cholecalciferol  1,000 Units Oral Daily   citalopram  10 mg Oral Daily   clopidogrel  75 mg Oral Daily   ferrous sulfate  324 mg Oral Q1400   finasteride  5 mg Oral Daily   insulin aspart  0-9 Units Subcutaneous TID AC & HS   isosorbide mononitrate  60 mg Oral Daily   mometasone-formoterol  2 puff Inhalation BID   multivitamin with minerals   Oral Daily   pantoprazole  40 mg Oral Daily   rosuvastatin  10 mg Oral Daily   sodium chloride flush  3 mL Intravenous Q12H   traZODone  100 mg Oral QHS   umeclidinium bromide  1 puff Inhalation Daily   Continuous Infusions:  sodium chloride 50 mL/hr at 11/08/21 1809   heparin 1,000 Units/hr (11/09/21 0608)   PRN Meds: acetaminophen **OR** acetaminophen, albuterol, nitroGLYCERIN   Vital Signs    Vitals:   11/08/21 2036 11/08/21 2037 11/09/21 0024 11/09/21 0427  BP: 133/88 133/88 (!) 141/74 130/72  Pulse:  99 92 100  Resp:  18 16 18   Temp:  98.2 F (36.8 C) 98.2 F (36.8 C) 98.2 F (36.8 C)  TempSrc:  Oral Oral Oral  SpO2:  98% 99% 99%  Weight:    90.4 kg  Height:        Intake/Output Summary (Last 24 hours) at 11/09/2021 0821 Last data filed at 11/09/2021 0703 Gross per 24 hour  Intake 842.81 ml  Output 2500 ml  Net -1657.19 ml   Last 3 Weights 11/09/2021 11/07/2021 11/03/2021  Weight (lbs) 199 lb 6.4 oz 198 lb 211 lb  Weight (kg) 90.447 kg 89.812 kg 95.709 kg      Telemetry    AFib at 100s - Personally Reviewed  ECG    N/A  Physical Exam   GEN: No acute distress.   Neck: No JVD Cardiac: Ir Ir , no murmurs, rubs, or gallops.  Respiratory: Clear  to auscultation bilaterally. GI: Soft, nontender, non-distended  MS: No edema; No deformity. Neuro:  Nonfocal  Psych: Normal affect   Labs    High Sensitivity Troponin:   Recent Labs  Lab 11/07/21 1223 11/07/21 1413 11/07/21 1730 11/08/21 0339  TROPONINIHS 30* 589* 2,957* 3,932*     Chemistry Recent Labs  Lab 11/07/21 1628 11/08/21 0339 11/09/21 0338  NA 132* 131* 134*  K 3.3* 3.0* 3.2*  CL 82* 85* 87*  CO2 34* 33* 36*  GLUCOSE 111* 206* 189*  BUN 98* 94* 67*  CREATININE 2.69* 2.45* 1.76*  CALCIUM 9.5 9.2 10.0  MG 2.5*  --  2.4  PROT  --  5.7*  --   ALBUMIN  --  2.7*  --   AST  --  28  --   ALT  --  24  --   ALKPHOS  --  61  --   BILITOT  --  0.6  --   GFRNONAA 23* 26* 39*  ANIONGAP 16* 13 11    Lipids No results for input(s): CHOL, TRIG, HDL, LABVLDL, LDLCALC, CHOLHDL in the last 168 hours.  Hematology Recent Labs  Lab 11/07/21 1223 11/08/21 0339 11/09/21 0338  WBC 11.0* 9.9 10.4  RBC 4.72 4.31 4.54  HGB 14.3 12.8* 13.7  HCT 42.8 39.4 41.3  MCV 90.7 91.4 91.0  MCH 30.3 29.7 30.2  MCHC 33.4 32.5 33.2  RDW 13.5 13.7 13.6  PLT 412* 306 289   Thyroid  Recent Labs  Lab 11/07/21 1628  TSH 2.937    BNP Recent Labs  Lab 11/03/21 1220 11/07/21 1628  BNP  --  197.4*  PROBNP 168.0*  --     DDimer No results for input(s): DDIMER in the last 168 hours.   Radiology    DG Chest 2 View  Result Date: 11/07/2021 CLINICAL DATA:  Chest pain EXAM: CHEST - 2 VIEW COMPARISON:  None. FINDINGS: Cardiac and mediastinal contours are unchanged. Prominence of the right cardiac contour is unchanged compared to multiple priors and likely due to prominent pericardial fat. No large pleural effusion or pneumothorax. Mild bibasilar opacities, likely due to bibasilar atelectasis. IMPRESSION: Mild bibasilar opacities, likely due to atelectasis. Electronically Signed   By: Yetta Glassman M.D.   On: 11/07/2021 12:48   ECHOCARDIOGRAM COMPLETE  Result Date: 11/08/2021     ECHOCARDIOGRAM REPORT   Patient Name:   [name]. Date of Exam: 11/08/2021 Medical Rec #:  [id number]         Height:       63.0 in Accession #:    [id number]        Weight:       198.0 lb Date of Birth:  10/07/1941         BSA:          1.925 m Patient Age:    80 years          BP:           121/86 mmHg Patient Gender: M                 HR:           85 bpm. Exam Location:  Inpatient Procedure: 2D Echo, Cardiac Doppler and Color Doppler Indications:    I48.91* Unspeicified atrial fibrillation  History:        Patient has prior history of Echocardiogram examinations, most                 recent 02/04/2018. CHF, Previous Myocardial Infarction and CAD,                 Abnormal ECG, Stroke, Arrythmias:Atrial Fibrillation; Risk                 Factors:Former Smoker, Hypertension, Diabetes and Dyslipidemia.  Sonographer:    Roseanna Rainbow RDCS Referring Phys: [id number] [facility]  Sonographer Comments: Technically difficult study due to poor echo windows. IMPRESSIONS  1. Left ventricular ejection fraction, by estimation, is 55 to 60%. The left ventricle has normal function. The left ventricle has no regional wall motion abnormalities. There is moderate left ventricular hypertrophy. Left ventricular diastolic parameters are indeterminate.  2. Right ventricular systolic function is normal. The right ventricular size is normal.  3. The mitral valve is normal in structure. No evidence of mitral valve regurgitation. No evidence of mitral stenosis. Moderate mitral annular calcification.  4. The aortic valve is tricuspid. Aortic valve regurgitation is not visualized. Aortic valve sclerosis/calcification is present, without any evidence of aortic stenosis.  5. The inferior vena cava is normal in size with greater than 50% respiratory variability,  suggesting right atrial pressure of 3 mmHg.  6. Technically difficult study with poor acoustic windows.  7. The patient was in atrial fibrillation. FINDINGS  Left Ventricle: Left  ventricular ejection fraction, by estimation, is 55 to 60%. The left ventricle has normal function. The left ventricle has no regional wall motion abnormalities. The left ventricular internal cavity size was normal in size. There is  moderate left ventricular hypertrophy. Left ventricular diastolic parameters are indeterminate. Right Ventricle: The right ventricular size is normal. No increase in right ventricular wall thickness. Right ventricular systolic function is normal. Left Atrium: Left atrial size was normal in size. Right Atrium: Right atrial size was normal in size. Pericardium: There is no evidence of pericardial effusion. Mitral Valve: The mitral valve is normal in structure. There is mild calcification of the mitral valve leaflet(s). Moderate mitral annular calcification. No evidence of mitral valve regurgitation. No evidence of mitral valve stenosis. Tricuspid Valve: The tricuspid valve is normal in structure. Tricuspid valve regurgitation is not demonstrated. Aortic Valve: The aortic valve is tricuspid. Aortic valve regurgitation is not visualized. Aortic valve sclerosis/calcification is present, without any evidence of aortic stenosis. Pulmonic Valve: The pulmonic valve was normal in structure. Pulmonic valve regurgitation is not visualized. Aorta: The aortic root is normal in size and structure. Venous: The inferior vena cava is normal in size with greater than 50% respiratory variability, suggesting right atrial pressure of 3 mmHg. IAS/Shunts: No atrial level shunt detected by color flow Doppler.  LEFT VENTRICLE PLAX 2D LVIDd:         5.15 cm LVIDs:         3.80 cm LV PW:         1.35 cm LV IVS:        1.55 cm LVOT diam:     2.00 cm LV SV:         47 LV SV Index:   25 LVOT Area:     3.14 cm  LV Volumes (MOD) LV vol d, MOD A2C: 44.5 ml LV vol d, MOD A4C: 47.5 ml LV vol s, MOD A2C: 19.0 ml LV vol s, MOD A4C: 16.5 ml LV SV MOD A2C:     25.5 ml LV SV MOD A4C:     47.5 ml LV SV MOD BP:      28.1 ml  RIGHT VENTRICLE            IVC RV S prime:     4.80 cm/s  IVC diam: 1.90 cm TAPSE (M-mode): 1.0 cm LEFT ATRIUM             Index        RIGHT ATRIUM           Index LA diam:        4.10 cm 2.13 cm/m   RA Area:     16.00 cm LA Vol (A2C):   41.7 ml 21.66 ml/m  RA Volume:   37.20 ml  19.32 ml/m LA Vol (A4C):   39.7 ml 20.62 ml/m LA Biplane Vol: 40.5 ml 21.03 ml/m  AORTIC VALVE LVOT Vmax:   87.80 cm/s LVOT Vmean:  56.200 cm/s LVOT VTI:    0.151 m  AORTA Ao Root diam: 3.60 cm Ao Asc diam:  3.40 cm MITRAL VALVE MV Area (PHT): 3.92 cm     SHUNTS MV Decel Time: 194 msec     Systemic VTI:  0.15 m MV E velocity: 117.67 cm/s  Systemic Diam: 2.00 cm [employer] Electronically signed  by Franki Monte Signature Date/Time: 11/08/2021/5:04:42 PM    Final     Cardiac Studies   Echo 11/08/2021 1. Left ventricular ejection fraction, by estimation, is 55 to 60%. The  left ventricle has normal function. The left ventricle has no regional  wall motion abnormalities. There is moderate left ventricular hypertrophy.  Left ventricular diastolic  parameters are indeterminate.   2. Right ventricular systolic function is normal. The right ventricular  size is normal.   3. The mitral valve is normal in structure. No evidence of mitral valve  regurgitation. No evidence of mitral stenosis. Moderate mitral annular  calcification.   4. The aortic valve is tricuspid. Aortic valve regurgitation is not  visualized. Aortic valve sclerosis/calcification is present, without any  evidence of aortic stenosis.   5. The inferior vena cava is normal in size with greater than 50%  respiratory variability, suggesting right atrial pressure of 3 mmHg.   6. Technically difficult study with poor acoustic windows.   7. The patient was in atrial fibrillation.   Patient Profile     80 y.o. 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 Rogers Blocker.  Assessment & Plan    1. Acute kidney injury with hyponatremia, hypokalemia, hypochloremia - suspect related to volume depletion in the setting of diuretic dosing with furosemide with addition of metolazone; pt appears to have tipped over into dehydration - held diuretics, Farxiga - primary team is managing lytes and rehydration  - Scr is improving, 1.76 today  - Needs to review diuretic regimen at discharge.    2. New onset atrial fib RVR - CHADSVASC 8 for CHF, HTN, DM, age, prior stroke, vascular disease therefore felt to warrant anticoagulation at this time - Started heparin per pharmacy >> transition of NOAC post cath  - HR stable at 100s - Echo showed LVEF of 55-60%, no regional WM abnormality - TSH normal  - Continue coreg   3. NSTEMI with known history of CAD with prior PCIs, known occluded RCA - unclear if related to demand ischemia/atrial fib or de novo ACS - also has underlying substrate for angina with known occluded RCA - Hs troponin 30>>[id number]>>3932 - On heparin per pharmacy - Continue Plavix without ASA given likely need for [facility] - For cath tomorrow  - Scr is improving - Continue Coreg, Imdur, and statin   4. COPD with recent Covid-19 infection/CAP - further management per primary team  5. HLD - 09/20/2021: Cholesterol 133; HDL 46.20; LDL Cholesterol 67; Triglycerides 98.0; VLDL 19.6  - On Crestor 10mg  qd   For questions or updates, please contact [place] Please consult [email or web address] for contact info under        Signed, Leanor Kail, PA  11/09/2021, 8:21 AM     Patient examined chart reviewed No chest pain afib rate ok on heparin needs to be transitioned to [place] post cath. Hydrating and Cr improved Known occluded RCA with  collaterals with troponin upt to 3932 continue heparin , nitrates and beta blocker On for cath tomorrow so long as Cr continues to improved with Dr Hillery [name] at noon Risks discussed willing to proceed Surprisingly TTE yesterday with normal EF and no  RWMA;s   Jenkins Rouge MD [facility]

## 2021-11-09 NOTE — Progress Notes (Addendum)
Inpatient Diabetes Program Recommendations  AACE/ADA: New Consensus Statement on Inpatient Glycemic Control (2015)  Target Ranges:  Prepandial:   less than 140 mg/dL      Peak postprandial:   less than 180 mg/dL (1-2 hours)      Critically ill patients:  140 - 180 mg/dL   Lab Results  Component Value Date   GLUCAP 306 (H) 11/09/2021   HGBA1C 6.6 (H) 09/20/2021    Review of Glycemic Control  Latest Reference Range & Units 11/09/21 08:09 11/09/21 11:45 11/09/21 13:05  Glucose-Capillary 70 - 99 mg/dL 221 (H) 405 (H) 306 (H)  (H): Data is abnormally high  Diabetes history: DM2 Outpatient Diabetes medications:  Farxiga 2.5 mg QD Trulicity 4.5 weekly Novolog 7-9 units TID Current orders for Inpatient glycemic control:  Novolog 0-15 units TID  Inpatient Diabetes Program Recommendations:    Semglee 10 units QD Novolog 3 units tid with meals Might consider obtaining A1C, was 6.6% on 09/20/21, however his blood sugars do not reflect that.  Will continue to follow while inpatient.  Thank you, Reche Dixon, RN, BSN Diabetes Coordinator Inpatient Diabetes Program [phone] (team pager from 8a-5p)

## 2021-11-09 NOTE — Progress Notes (Addendum)
PROGRESS NOTE    [name].  FWY:[id number] DOB: 1941-07-20 DOA: 11/07/2021 PCP: Haydee Salter, MD    Brief Narrative:  [name] was admitted to the hospital with the working diagnosis of NEW onset atrial fibrillation complicated with AKI on CKD stage 3b, and elevated troponin.    80 year old male past medical history for coronary disease, COPD, chronic hypoxic respiratory failure, hypertension, chronic kidney disease, dyslipidemia, peripheral vascular disease, and type 2 diabetes mellitus who presented with chest pain.  He had acute onset of chest pain that woke him up from sleep.  8/10 intensity, sharp in nature, constant, associated with diaphoresis and dyspnea.  Because of severe symptoms he came to the hospital for further evaluation.   10/16/2021 diagnosed with COVID-19, received treatment with Paxlovid    Recent outpatient evaluation he was diagnosed with pneumonia, placed on levofloxacin and steroids.  The dose of his diuretic therapy was increased. On his initial physical examination his blood pressure was 142/73, heart rate 72, respirate 20, oxygen saturation 98%, he was afebrile.  His lungs are clear to auscultation bilaterally, no wheezing, rales or rhonchi, heart S1-S2, irregularly irregular, abdomen soft, positive trace lower extremity edema.   Sodium 132, potassium 3.3, chloride 82, bicarb 34, glucose 111, BUN 98, creatinine 2.69, BNP 197, high sensitive troponin [id number] SARS COVID-19 positive.   Chest radiograph with bibasilar atelectasis.   EKG 122 bpm, normal axis, normal intervals, atrial fibrillation rhythm, no significant ST segment or T wave changes.   Patient was placed on carvedilol for rate control and anticoagulation with heparin.  Renal function has been improving, Plan for possible coronary angiography 11/10/21  Assessment & Plan:   Principal Problem:   Atrial fibrillation ([place]) Active Problems:   Controlled diabetes mellitus type 2 with  complications (HCC)   Dyslipidemia   Hypokalemia   Iron deficiency anemia   Essential hypertension   Coronary artery disease   Chronic diastolic heart failure (HCC)   Hemiplegia, 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HCC)   Elevated troponin   Chronic respiratory failure with hypoxia and hypercapnia (HCC)   COPD with chronic bronchitis (HCC)   History of stroke   Acute renal failure superimposed on stage 3b chronic kidney disease (HCC)   Increased anion gap metabolic acidosis       New onset atrial fibrillation/ chronic diastolic heart failure.  Echocardiogram with preserved LV systolic function with EF 55 to 60%, no well motion abnormalities.   Patient with no chest pain, or dyspnea but continue with cough.  HR has been 100 to 105 atrial fibrillation rhythm personally reviewed telemetry.   Plant to continue rate control with carvedilol 25 mg bid and anticoagulation with heparin.  Plan for cardiac catheterization in am, as part of ischemia workup.    2. AKI on CKD stage 3b/ hypokalemia/ hyponatremia  Patient with no signs of volume overload, his renal function today shows a serum cr of 1,76 with k of 3,2 and serum bicarbonate at 36, Na is 134 and Cl 87.   Plan to add 40 meq Kcl today.  His base cr is baseline serum cr is 1,5.  Continue to avoid hypotension and nephrotoxic medications Check renal function in am.   Metabolic bone disease, continue with calcitriol.    3. COPD, chronic hypoxemic respiratory failure (uses home 02 at night at home)  Pneumonia has been ruled out. Patient tested positive for COVID 19, but had a positive test on 10/26, no signs of acute viral illness, no  isolation.   Patient with increased cough but no increase in sputum production. Plan to continue with bronchodilator therapy, inhaled corticosteroids and antitussive agents. Supplemental 02 per [place] to keep oxygen saturation 88% or greater.    4. HTN  Blood pressure has been stable. On  isosorbide.    5. T2DM/ dyslipidemia   Fasting glucose this am 189, continue with insulin sliding scale for glucose cover and monitoring.  Continue with statin therapy.    6. Hx of CVA/ depression  Continue with clopidogrel and rosuvastatin  Out of bed to chair tid with meals,  Continue with citalopram.    7. Anemia of chronic disease with iron deficiency.  Stable cell count.  Oral iron supplementation    8. Gout.  No signs of acute exacerbation    Patient continue to be at high risk for worsening atrial fibrillation   Status is: Inpatient  Remains inpatient appropriate because: IV heparin and telemetry monitoring   DVT prophylaxis: IV heparin   Code Status:    full  Family Communication:   No family at the bedside      Consultants:  Cardiology    Subjective: Patient with no nausea or vomiting, no chest pain, or dyspnea, he has dry cough and generalized weakness,  Objective: Vitals:   11/08/21 2036 11/08/21 2037 11/09/21 0024 11/09/21 0427  BP: 133/88 133/88 (!) 141/74 130/72  Pulse:  99 92 100  Resp:  18 16 18   Temp:  98.2 F (36.8 C) 98.2 F (36.8 C) 98.2 F (36.8 C)  TempSrc:  Oral Oral Oral  SpO2:  98% 99% 99%  Weight:    90.4 kg  Height:        Intake/Output Summary (Last 24 hours) at 11/09/2021 0838 Last data filed at 11/09/2021 0703 Gross per 24 hour  Intake 842.81 ml  Output 2500 ml  Net -1657.19 ml   Filed Weights   11/07/21 1207 11/09/21 0427  Weight: 89.8 kg 90.4 kg    Examination:   General: Not in pain or dyspnea, deconditioned  Neurology: Awake and alert, non focal  E ENT: no pallor, no icterus, oral mucosa moist Cardiovascular: No JVD. S1-S2 present, irregularly irregular , no gallops, rubs, or murmurs. Trace non pitting bilateral lower extremity edema. Pulmonary: positive breath sounds bilaterally, with no wheezing, but scattered bilateral rhonchi with no rales. Gastrointestinal. Abdomen soft and non tender Skin. No  rashes Musculoskeletal: no joint deformities     Data Reviewed: I have personally reviewed following labs and imaging studies  CBC: Recent Labs  Lab 11/03/21 1220 11/07/21 1223 11/08/21 0339 11/09/21 0338  WBC 6.4 11.0* 9.9 10.4  HGB 11.9* 14.3 12.8* 13.7  HCT 36.0* 42.8 39.4 41.3  MCV 93.5 90.7 91.4 91.0  PLT 313.0 412* 306 500   Basic Metabolic Panel: Recent Labs  Lab 11/03/21 1220 11/07/21 1223 11/07/21 1628 11/08/21 0339 11/09/21 0338  NA 137 130* 132* 131* 134*  K 4.5 2.7* 3.3* 3.0* 3.2*  CL 99 76* 82* 85* 87*  CO2 32 35* 34* 33* 36*  GLUCOSE 170* 259* 111* 206* 189*  BUN 43* 93* 98* 94* 67*  CREATININE 1.49 2.65* 2.69* 2.45* 1.76*  CALCIUM 9.2 9.7 9.5 9.2 10.0  MG  --   --  2.5*  --  2.4   GFR: Estimated Creatinine Clearance: 33.8 mL/min (A) (by C-G formula based on SCr of 1.76 mg/dL (H)). Liver Function Tests: Recent Labs  Lab 11/08/21 0339  AST 28  ALT 24  ALKPHOS  61  BILITOT 0.6  PROT 5.7*  ALBUMIN 2.7*   No results for input(s): LIPASE, AMYLASE in the last 168 hours. No results for input(s): AMMONIA in the last 168 hours. Coagulation Profile: Recent Labs  Lab 11/08/21 0207  INR 1.2   Cardiac Enzymes: No results for input(s): CKTOTAL, CKMB, CKMBINDEX, TROPONINI in the last 168 hours. BNP (last 3 results) Recent Labs    11/03/21 1220  PROBNP 168.0*   HbA1C: No results for input(s): HGBA1C in the last 72 hours. CBG: Recent Labs  Lab 11/08/21 1301 11/08/21 1731 11/08/21 2031 11/08/21 2153 11/09/21 0809  GLUCAP 239* 262* 298* 240* 221*   Lipid Profile: No results for input(s): CHOL, HDL, LDLCALC, TRIG, CHOLHDL, LDLDIRECT in the last 72 hours. Thyroid Function Tests: Recent Labs    11/07/21 1628  TSH 2.937   Anemia Panel: No results for input(s): VITAMINB12, FOLATE, FERRITIN, TIBC, IRON, RETICCTPCT in the last 72 hours.    Radiology Studies: I have reviewed all of the imaging during this hospital visit  personally     Scheduled Meds:  B-complex with vitamin C  1 tablet Oral Daily   calcitRIOL  0.25 mcg Oral Daily   carvedilol  25 mg Oral BID WC   cholecalciferol  1,000 Units Oral Daily   citalopram  10 mg Oral Daily   clopidogrel  75 mg Oral Daily   ferrous sulfate  324 mg Oral Q1400   finasteride  5 mg Oral Daily   insulin aspart  0-9 Units Subcutaneous TID AC & HS   isosorbide mononitrate  60 mg Oral Daily   mometasone-formoterol  2 puff Inhalation BID   multivitamin with minerals   Oral Daily   pantoprazole  40 mg Oral Daily   potassium chloride  40 mEq Oral Once   rosuvastatin  10 mg Oral Daily   sodium chloride flush  3 mL Intravenous Q12H   traZODone  100 mg Oral QHS   umeclidinium bromide  1 puff Inhalation Daily   Continuous Infusions:  sodium chloride 50 mL/hr at 11/08/21 1809   heparin 1,000 Units/hr (11/09/21 7543)     LOS: 1 day        [name] Gerome Apley, MD

## 2021-11-09 NOTE — Progress Notes (Signed)
Pt CBG of 267 at 2118. Pt did not have order for 2200 insulin dose (only for 3x/day with meals). Paged on call MD about this and he changed the order to include a night time sliding scale dose of insulin starting 11/18. For tonight Dr. Marlyce Huge gave a verbal order to give a sliding scale dose to cover the pt's CBG of 267 with the order that was still active for 11/17. A dose of 8 units novolog was given to pt at 2254. Will continue to monitor pt.

## 2021-11-09 NOTE — Progress Notes (Signed)
ANTICOAGULATION CONSULT NOTE - Follow Up Consult  Pharmacy Consult for Heparin Indication: chest pain/ACS and atrial fibrillation  Allergies  Allergen Reactions   Enalapril Maleate Cough    REACTION: cough   Lisinopril Cough   Shellfish-Derived Products Swelling    Said occurred twice; has eaten some since and had no reactions    Patient Measurements: Height: 5\' 3"  (160 cm) Weight: 90.4 kg (199 lb 6.4 oz) IBW/kg (Calculated) : 56.9 Heparin Dosing Weight: 76.7 kg  Vital Signs:    Labs: Recent Labs    11/07/21 1223 11/07/21 1223 11/07/21 1413 11/07/21 1628 11/07/21 1730 11/08/21 0207 11/08/21 0339 11/08/21 0809 11/09/21 0338 11/09/21 1422  HGB 14.3  --   --   --   --   --  12.8*  --  13.7  --   HCT 42.8  --   --   --   --   --  39.4  --  41.3  --   PLT 412*  --   --   --   --   --  306  --  289  --   LABPROT  --   --   --   --   --  14.9  --   --   --   --   INR  --   --   --   --   --  1.2  --   --   --   --   HEPARINUNFRC  --    < >  --   --   --  0.36  --  0.48 0.82* 0.29*  CREATININE 2.65*  --   --  2.69*  --   --  2.45*  --  1.76*  --   TROPONINIHS 30*  --  589*  --  2,957*  --  3,932*  --   --   --    < > = values in this interval not displayed.     Estimated Creatinine Clearance: 33.8 mL/min (A) (by C-G formula based on SCr of 1.76 mg/dL (H)).   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 Patient is presenting with chest pain. Heparin per pharmacy consult placed for chest pain/ACS and atrial fibrillation.  Patient is on not on anticoagulation prior to arrival. Tentatively scheduled for cath on Friday.  PM update - Heparin level resulted from this afternoon at 0.29, but per discussion with lab, some question as to whether this is an accurate level. Will repeat  to confirm.  Goal of Therapy:  Heparin level 0.3-0.7 units/ml Monitor platelets by anticoagulation protocol: Yes   Plan:  Continue heparin at current rate 1000 units/hr for now Stat repeat heparin level to confirm Monitor daily CBC, s/sx bleeding   Arturo Morton, PharmD, BCPS Please check AMION for all [place] contact numbers Clinical Pharmacist 11/09/2021 8:33 PM

## 2021-11-09 NOTE — Progress Notes (Signed)
ANTICOAGULATION CONSULT NOTE - Follow Up Consult  Pharmacy Consult for Heparin Indication: chest pain/ACS and atrial fibrillation  Allergies  Allergen Reactions   Enalapril Maleate Cough    REACTION: cough   Lisinopril Cough   Shellfish-Derived Products Swelling    Said occurred twice; has eaten some since and had no reactions    Patient Measurements: Height: 5\' 3"  (160 cm) Weight: 90.4 kg (199 lb 6.4 oz) IBW/kg (Calculated) : 56.9 Heparin Dosing Weight: 76.7 kg  Vital Signs: Temp: 98.2 F (36.8 C) (11/17 0427) Temp Source: Oral (11/17 0427) BP: 130/72 (11/17 0427) Pulse Rate: 100 (11/17 0427)  Labs: Recent Labs    11/07/21 1223 11/07/21 1413 11/07/21 1628 11/07/21 1730 11/08/21 0207 11/08/21 0339 11/08/21 0809 11/09/21 0338  HGB 14.3  --   --   --   --  12.8*  --  13.7  HCT 42.8  --   --   --   --  39.4  --  41.3  PLT 412*  --   --   --   --  306  --  289  LABPROT  --   --   --   --  14.9  --   --   --   INR  --   --   --   --  1.2  --   --   --   HEPARINUNFRC  --   --   --   --  0.36  --  0.48 0.82*  CREATININE 2.65*  --  2.69*  --   --  2.45*  --   --   TROPONINIHS 30* 589*  --  2,957*  --  3,932*  --   --      Estimated Creatinine Clearance: 24.3 mL/min (A) (by C-G formula based on SCr of 2.45 mg/dL (H)).   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 Patient is presenting with chest pain. Heparin per pharmacy consult placed for chest pain/ACS and atrial fibrillation.  Patient is on not on anticoagulation prior to arrival.  Heparin level up to supratherapeutic (0.82) on gtt at 1100 units/hr. No bleeding noted. Patient tentatively scheduled for cath on Friday.   Goal of Therapy:  Heparin level 0.3-0.7 units/ml Monitor platelets by anticoagulation protocol:  Yes   Plan:  Decrease heparin to 1000 units/hr  Will f/u 8 hr heparin level  Sherlon Handing, PharmD, BCPS Please see amion for complete clinical pharmacist phone list 11/09/2021 5:34 AM

## 2021-11-10 ENCOUNTER — Ambulatory Visit: Payer: Medicare Other | Admitting: Family Medicine

## 2021-11-10 ENCOUNTER — Inpatient Hospital Stay (HOSPITAL_COMMUNITY)
Admission: EM | Disposition: A | Discharge status: Home Health | Payer: Self-pay | Source: Home / Self Care | Attending: Internal Medicine

## 2021-11-10 ENCOUNTER — Inpatient Hospital Stay (HOSPITAL_COMMUNITY): Payer: Medicare Other

## 2021-11-10 DIAGNOSIS — I4819 Other persistent atrial fibrillation: Secondary | ICD-10-CM | POA: Diagnosis not present

## 2021-11-10 DIAGNOSIS — E440 Moderate protein-calorie malnutrition: Secondary | ICD-10-CM | POA: Insufficient documentation

## 2021-11-10 DIAGNOSIS — J9611 Chronic respiratory failure with hypoxia: Secondary | ICD-10-CM | POA: Diagnosis not present

## 2021-11-10 DIAGNOSIS — N179 Acute kidney failure, unspecified: Secondary | ICD-10-CM | POA: Diagnosis not present

## 2021-11-10 DIAGNOSIS — I5032 Chronic diastolic (congestive) heart failure: Secondary | ICD-10-CM | POA: Diagnosis not present

## 2021-11-10 HISTORY — PX: LEFT HEART CATH AND CORONARY ANGIOGRAPHY: CATH118249

## 2021-11-10 LAB — GLUCOSE, CAPILLARY
Glucose-Capillary: 186 mg/dL — ABNORMAL HIGH (ref 70–99)
Glucose-Capillary: 193 mg/dL — ABNORMAL HIGH (ref 70–99)
Glucose-Capillary: 163 mg/dL — ABNORMAL HIGH (ref 70–99)
Glucose-Capillary: 177 mg/dL — ABNORMAL HIGH (ref 70–99)
Glucose-Capillary: 262 mg/dL — ABNORMAL HIGH (ref 70–99)

## 2021-11-10 LAB — BASIC METABOLIC PANEL
Anion gap: 8 (ref 5–15)
BUN: 54 mg/dL — ABNORMAL HIGH (ref 8–23)
CO2: 36 mmol/L — ABNORMAL HIGH (ref 22–32)
Calcium: 10.3 mg/dL (ref 8.9–10.3)
Chloride: 90 mmol/L — ABNORMAL LOW (ref 98–111)
Creatinine, Ser: 1.69 mg/dL — ABNORMAL HIGH (ref 0.61–1.24)
GFR, Estimated: 41 mL/min — ABNORMAL LOW (ref >60)
Glucose, Bld: 218 mg/dL — ABNORMAL HIGH (ref 70–99)
Potassium: 3.6 mmol/L (ref 3.5–5.1)
Sodium: 134 mmol/L — ABNORMAL LOW (ref 135–145)

## 2021-11-10 LAB — CBC
HCT: 39.5 % (ref 39.0–52.0)
Hemoglobin: 13 g/dL (ref 13.0–17.0)
MCH: 30.3 pg (ref 26.0–34.0)
MCHC: 32.9 g/dL (ref 30.0–36.0)
MCV: 92.1 fL (ref 80.0–100.0)
Platelets: 256 10*3/uL (ref 150–400)
RBC: 4.29 MIL/uL (ref 4.22–5.81)
RDW: 13.7 % (ref 11.5–15.5)
WBC: 9.9 10*3/uL (ref 4.0–10.5)
nRBC: 0 % (ref 0.0–0.2)

## 2021-11-10 LAB — POCT ACTIVATED CLOTTING TIME: Activated Clotting Time: 144 seconds

## 2021-11-10 LAB — HEPARIN LEVEL (UNFRACTIONATED): Heparin Unfractionated: 0.46 IU/mL (ref 0.30–0.70)

## 2021-11-10 SURGERY — LEFT HEART CATH AND CORONARY ANGIOGRAPHY
Anesthesia: LOCAL

## 2021-11-10 MED ORDER — LABETALOL HCL 5 MG/ML IV SOLN: 10.0000 mg | INTRAVENOUS | Status: AC | PRN

## 2021-11-10 MED ORDER — MIDAZOLAM HCL 2 MG/2ML IJ SOLN
INTRAMUSCULAR | Status: DC | PRN
  Administered 2021-11-10: 1 mg via INTRAVENOUS

## 2021-11-10 MED ORDER — ENSURE ENLIVE PO LIQD
237.0000 mL | Freq: Three times a day (TID) | ORAL | Status: DC
  Administered 2021-11-10 – 2021-11-14 (×8): 237 mL via ORAL

## 2021-11-10 MED ORDER — HEPARIN (PORCINE) IN NACL 1000-0.9 UT/500ML-% IV SOLN
INTRAVENOUS | Status: AC
  Filled 2021-11-10: qty 1000

## 2021-11-10 MED ORDER — SODIUM CHLORIDE 0.9% FLUSH: 3.0000 mL | INTRAVENOUS | Status: DC | PRN

## 2021-11-10 MED ORDER — MIDAZOLAM HCL 2 MG/2ML IJ SOLN
INTRAMUSCULAR | Status: AC
  Filled 2021-11-10: qty 2

## 2021-11-10 MED ORDER — HEPARIN (PORCINE) IN NACL 1000-0.9 UT/500ML-% IV SOLN
INTRAVENOUS | Status: DC | PRN
  Administered 2021-11-10 (×2): 500 mL

## 2021-11-10 MED ORDER — SODIUM CHLORIDE 0.9% FLUSH: 3.0000 mL | Freq: Two times a day (BID) | INTRAVENOUS | Status: DC

## 2021-11-10 MED ORDER — HEPARIN SODIUM (PORCINE) 1000 UNIT/ML IJ SOLN
INTRAMUSCULAR | Status: AC
  Filled 2021-11-10: qty 1

## 2021-11-10 MED ORDER — VERAPAMIL HCL 2.5 MG/ML IV SOLN
INTRAVENOUS | Status: AC
  Filled 2021-11-10: qty 2

## 2021-11-10 MED ORDER — SODIUM CHLORIDE 0.9 % IV SOLN: 250.0000 mL | INTRAVENOUS | Status: DC | PRN

## 2021-11-10 MED ORDER — SODIUM CHLORIDE 0.9 % IV SOLN: INTRAVENOUS | Status: AC

## 2021-11-10 MED ORDER — ACETAMINOPHEN 325 MG PO TABS
650.0000 mg | ORAL_TABLET | ORAL | Status: DC | PRN
  Administered 2021-11-10 – 2021-11-12 (×3): 650 mg via ORAL
  Filled 2021-11-10 (×3): qty 2

## 2021-11-10 MED ORDER — LIDOCAINE HCL (PF) 1 % IJ SOLN
INTRAMUSCULAR | Status: DC | PRN
  Administered 2021-11-10: 15 mL

## 2021-11-10 MED ORDER — IOHEXOL 350 MG/ML SOLN
INTRAVENOUS | Status: DC | PRN
  Administered 2021-11-10: 55 mL

## 2021-11-10 MED ORDER — ONDANSETRON HCL 4 MG/2ML IJ SOLN: 4.0000 mg | Freq: Four times a day (QID) | INTRAMUSCULAR | Status: DC | PRN

## 2021-11-10 MED ORDER — HYDRALAZINE HCL 20 MG/ML IJ SOLN: 10.0000 mg | INTRAMUSCULAR | Status: AC | PRN

## 2021-11-10 MED ORDER — FENTANYL CITRATE (PF) 100 MCG/2ML IJ SOLN
INTRAMUSCULAR | Status: DC | PRN
  Administered 2021-11-10: 25 ug via INTRAVENOUS

## 2021-11-10 MED ORDER — HEPARIN (PORCINE) 25000 UT/250ML-% IV SOLN
1000.0000 [IU]/h | INTRAVENOUS | Status: DC
  Administered 2021-11-11: 1000 [IU]/h via INTRAVENOUS
  Filled 2021-11-10: qty 250

## 2021-11-10 MED ORDER — FENTANYL CITRATE (PF) 100 MCG/2ML IJ SOLN
INTRAMUSCULAR | Status: AC
  Filled 2021-11-10: qty 2

## 2021-11-10 MED ORDER — ENSURE ENLIVE PO LIQD
237.0000 mL | Freq: Three times a day (TID) | ORAL | Status: DC
Start: 2021-11-10 — End: 2021-11-10

## 2021-11-10 MED ORDER — LIDOCAINE HCL (PF) 1 % IJ SOLN
INTRAMUSCULAR | Status: AC
  Filled 2021-11-10: qty 30

## 2021-11-10 SURGICAL SUPPLY — 11 items
CATH INFINITI 6F ANG MULTIPACK (CATHETERS) ×1 IMPLANT
GUIDEWIRE TIGER .035X300 (WIRE) ×1 IMPLANT
KIT HEART LEFT (KITS) ×2 IMPLANT
PACK CARDIAC CATHETERIZATION (CUSTOM PROCEDURE TRAY) ×2 IMPLANT
SHEATH PINNACLE 6F 10CM (SHEATH) ×1 IMPLANT
SHEATH PROBE COVER 6X72 (BAG) ×1 IMPLANT
TRANSDUCER W/STOPCOCK (MISCELLANEOUS) ×2 IMPLANT
TUBING CIL FLEX 10 FLL-RA (TUBING) ×2 IMPLANT
WIRE ASAHI PROWATER 300CM (WIRE) ×1 IMPLANT
WIRE EMERALD 3MM-J .035X150CM (WIRE) ×1 IMPLANT
WIRE MICRO SET SILHO 5FR 7 (SHEATH) ×1 IMPLANT

## 2021-11-10 NOTE — Progress Notes (Addendum)
ANTICOAGULATION CONSULT NOTE - Follow Up Consult  Pharmacy Consult for IV Heparin Indication:  Atrial Fibrillation  Allergies  Allergen Reactions   Enalapril Maleate Cough    REACTION: cough   Lisinopril Cough   Shellfish-Derived Products Swelling    Said occurred twice; has eaten some since and had no reactions    Patient Measurements: Height: 5\' 3"  (160 cm) Weight: 89.7 kg (197 lb 12 oz) IBW/kg (Calculated) : 56.9 Heparin Dosing Weight: 76.7 kg  Vital Signs: Temp: 97.6 F (36.4 C) (11/18 0711) Temp Source: Oral (11/18 0711) BP: 129/78 (11/18 1503) Pulse Rate: 101 (11/18 1503)  Labs: Recent Labs    11/07/21 1628 11/07/21 1730 11/08/21 0207 11/08/21 0339 11/08/21 0809 11/09/21 0338 11/09/21 1422 11/09/21 2054 11/10/21 0155  HGB   < >  --   --  12.8*  --  13.7  --   --  13.0  HCT  --   --   --  39.4  --  41.3  --   --  39.5  PLT  --   --   --  306  --  289  --   --  256  LABPROT  --   --  14.9  --   --   --   --   --   --   INR  --   --  1.2  --   --   --   --   --   --   HEPARINUNFRC  --   --  0.36  --    < > 0.82* 0.29* 0.50 0.46  CREATININE  --   --   --  2.45*  --  1.76*  --   --  1.69*  TROPONINIHS  --  2,957*  --  3,932*  --   --   --   --   --    < > = values in this interval not displayed.    Estimated Creatinine Clearance: 35.1 mL/min (A) (by C-G formula based on SCr of 1.69 mg/dL (H)).   Assessment: 80 yr old man with hx of CAD (S/P multiple PCIs), chronic combined CHF with normalized EF 2019, depression, obesity (S/P lap band), HTN, CKD stage IIIb, COPD,HLD, PAD, DM, stroke, PE in 2006, OA presented with CP. Pharmacy was consulted to dose IV heparin for chest pain/ACS and atrial fibrillation. Pt was not on anticoagulant PTA.  Pt is S/P cardiac cath this afternoon. Pharmacy is consulted to resume heparin infusion for atrial fibrillation 8 hrs after sheath removal. Per cath lab RN, sheath was removed at at 1618 PM. Pt had therapeutic heparin levels on  heparin infusion at 1000 units/hr prior to cath.  Goal of Therapy:  Heparin level 0.3-0.7 units/ml Monitor platelets by anticoagulation protocol: Yes   Plan:  Resume heparin infusion at 1000 units/hr ~8 hrs after sheath removal (resume at ~midnight tonight) Check heparin level 8 hrs after resuming heparin infusion Monitor daily heparin level, CBC Monitor for bleeding F/U long-term anticoagulation plan for a fib  Gillermina Hu, PharmD, BCPS, [facility] Clinical Pharmacist 11/10/2021 3:24 PM

## 2021-11-10 NOTE — Progress Notes (Addendum)
Progress Note  Patient Name: [name]. Date of Encounter: 11/10/2021  [facility] Cardiologist: Lauree Chandler, MD   Subjective   Breathing stable on 3L oxygen. No chest pain.   Inpatient Medications    Scheduled Meds:  B-complex with vitamin C  1 tablet Oral Daily   calcitRIOL  0.25 mcg Oral Daily   carvedilol  25 mg Oral BID WC   chlorpheniramine-HYDROcodone  5 mL Oral Q12H   cholecalciferol  1,000 Units Oral Daily   citalopram  10 mg Oral Daily   clopidogrel  75 mg Oral Daily   ferrous sulfate  325 mg Oral Q breakfast   finasteride  5 mg Oral Daily   insulin aspart  0-15 Units Subcutaneous TID AC & HS   ipratropium-albuterol  3 mL Nebulization Q6H   isosorbide mononitrate  60 mg Oral Daily   mometasone-formoterol  2 puff Inhalation BID   multivitamin with minerals   Oral Daily   pantoprazole  40 mg Oral Daily   rosuvastatin  10 mg Oral Daily   sodium chloride flush  3 mL Intravenous Q12H   traZODone  100 mg Oral QHS   Continuous Infusions:  sodium chloride     sodium chloride 1 mL/kg/hr (11/10/21 0538)   heparin 1,000 Units/hr (11/09/21 1725)   PRN Meds: sodium chloride, acetaminophen **OR** acetaminophen, albuterol, guaiFENesin-dextromethorphan, nitroGLYCERIN, sodium chloride flush   Vital Signs    Vitals:   11/09/21 1632 11/09/21 2124 11/10/21 0511 11/10/21 0711  BP:  125/71 (!) 118/41 (!) 125/92  Pulse:  80 98 79  Resp:  15 19 18   Temp:  98.3 F (36.8 C) 98.4 F (36.9 C) 97.6 F (36.4 C)  TempSrc:  Oral Oral Oral  SpO2: 99% 100%  96%  Weight:   89.7 kg   Height:        Intake/Output Summary (Last 24 hours) at 11/10/2021 0842 Last data filed at 11/10/2021 0600 Gross per 24 hour  Intake 991.22 ml  Output 1050 ml  Net -58.78 ml   Last 3 Weights 11/10/2021 11/09/2021 11/07/2021  Weight (lbs) 197 lb 12 oz 199 lb 6.4 oz 198 lb  Weight (kg) 89.7 kg 90.447 kg 89.812 kg      Telemetry    Afib at 100s - Personally  Reviewed  ECG    N/A  Physical Exam   GEN: No acute distress.   Neck: No JVD Cardiac: IR IR , no murmurs, rubs, or gallops.  Respiratory: course breath sound  GI: Soft, nontender, non-distended  MS: No edema; No deformity. Neuro:  Nonfocal  Psych: Normal affect   Labs    High Sensitivity Troponin:   Recent Labs  Lab 11/07/21 1223 11/07/21 1413 11/07/21 1730 11/08/21 0339  TROPONINIHS 30* 589* 2,957* 3,932*     Chemistry Recent Labs  Lab 11/07/21 1628 11/08/21 0339 11/09/21 0335 11/09/21 0338 11/10/21 0155  NA 132* 131*  --  134* 134*  K 3.3* 3.0*  --  3.2* 3.6  CL 82* 85*  --  87* 90*  CO2 34* 33*  --  36* 36*  GLUCOSE 111* 206* 181* 189* 218*  BUN 98* 94*  --  67* 54*  CREATININE 2.69* 2.45*  --  1.76* 1.69*  CALCIUM 9.5 9.2  --  10.0 10.3  MG 2.5*  --   --  2.4  --   PROT  --  5.7*  --   --   --   ALBUMIN  --  2.7*  --   --   --  AST  --  28  --   --   --   ALT  --  24  --   --   --   ALKPHOS  --  61  --   --   --   BILITOT  --  0.6  --   --   --   GFRNONAA 23* 26*  --  39* 41*  ANIONGAP 16* 13  --  11 8    Lipids No results for input(s): CHOL, TRIG, HDL, LABVLDL, LDLCALC, CHOLHDL in the last 168 hours.  Hematology Recent Labs  Lab 11/08/21 0339 11/09/21 0338 11/10/21 0155  WBC 9.9 10.4 9.9  RBC 4.31 4.54 4.29  HGB 12.8* 13.7 13.0  HCT 39.4 41.3 39.5  MCV 91.4 91.0 92.1  MCH 29.7 30.2 30.3  MCHC 32.5 33.2 32.9  RDW 13.7 13.6 13.7  PLT 306 289 256   Thyroid  Recent Labs  Lab 11/07/21 1628  TSH 2.937    BNP Recent Labs  Lab 11/03/21 1220 11/07/21 1628  BNP  --  197.4*  PROBNP 168.0*  --     DDimer No results for input(s): DDIMER in the last 168 hours.   Radiology    ECHOCARDIOGRAM COMPLETE  Result Date: 11/08/2021    ECHOCARDIOGRAM REPORT   Patient Name:   [name]. Date of Exam: 11/08/2021 Medical Rec #:  [id number]         Height:       63.0 in Accession #:    [id number]        Weight:       198.0 lb Date of Birth:   [birth year]         BSA:          1.925 m Patient Age:    80 years          BP:           121/86 mmHg Patient Gender: M                 HR:           85 bpm. Exam Location:  Inpatient Procedure: 2D Echo, Cardiac Doppler and Color Doppler Indications:    I48.91* Unspeicified atrial fibrillation  History:        Patient has prior history of Echocardiogram examinations, most                 recent 02/04/2018. CHF, Previous Myocardial Infarction and CAD,                 Abnormal ECG, Stroke, Arrythmias:Atrial Fibrillation; Risk                 Factors:Former Smoker, Hypertension, Diabetes and Dyslipidemia.  Sonographer:    Roseanna Rainbow RDCS Referring Phys: [id number] [facility]  Sonographer Comments: Technically difficult study due to poor echo windows. IMPRESSIONS  1. Left ventricular ejection fraction, by estimation, is 55 to 60%. The left ventricle has normal function. The left ventricle has no regional wall motion abnormalities. There is moderate left ventricular hypertrophy. Left ventricular diastolic parameters are indeterminate.  2. Right ventricular systolic function is normal. The right ventricular size is normal.  3. The mitral valve is normal in structure. No evidence of mitral valve regurgitation. No evidence of mitral stenosis. Moderate mitral annular calcification.  4. The aortic valve is tricuspid. Aortic valve regurgitation is not visualized. Aortic valve sclerosis/calcification is present, without any evidence of aortic stenosis.  5. The inferior  vena cava is normal in size with greater than 50% respiratory variability, suggesting right atrial pressure of 3 mmHg.  6. Technically difficult study with poor acoustic windows.  7. The patient was in atrial fibrillation. FINDINGS  Left Ventricle: Left ventricular ejection fraction, by estimation, is 55 to 60%. The left ventricle has normal function. The left ventricle has no regional wall motion abnormalities. The left ventricular internal cavity size was normal  in size. There is  moderate left ventricular hypertrophy. Left ventricular diastolic parameters are indeterminate. Right Ventricle: The right ventricular size is normal. No increase in right ventricular wall thickness. Right ventricular systolic function is normal. Left Atrium: Left atrial size was normal in size. Right Atrium: Right atrial size was normal in size. Pericardium: There is no evidence of pericardial effusion. Mitral Valve: The mitral valve is normal in structure. There is mild calcification of the mitral valve leaflet(s). Moderate mitral annular calcification. No evidence of mitral valve regurgitation. No evidence of mitral valve stenosis. Tricuspid Valve: The tricuspid valve is normal in structure. Tricuspid valve regurgitation is not demonstrated. Aortic Valve: The aortic valve is tricuspid. Aortic valve regurgitation is not visualized. Aortic valve sclerosis/calcification is present, without any evidence of aortic stenosis. Pulmonic Valve: The pulmonic valve was normal in structure. Pulmonic valve regurgitation is not visualized. Aorta: The aortic root is normal in size and structure. Venous: The inferior vena cava is normal in size with greater than 50% respiratory variability, suggesting right atrial pressure of 3 mmHg. IAS/Shunts: No atrial level shunt detected by color flow Doppler.  LEFT VENTRICLE PLAX 2D LVIDd:         5.15 cm LVIDs:         3.80 cm LV PW:         1.35 cm LV IVS:        1.55 cm LVOT diam:     2.00 cm LV SV:         47 LV SV Index:   25 LVOT Area:     3.14 cm  LV Volumes (MOD) LV vol d, MOD A2C: 44.5 ml LV vol d, MOD A4C: 47.5 ml LV vol s, MOD A2C: 19.0 ml LV vol s, MOD A4C: 16.5 ml LV SV MOD A2C:     25.5 ml LV SV MOD A4C:     47.5 ml LV SV MOD BP:      28.1 ml RIGHT VENTRICLE            IVC RV S prime:     4.80 cm/s  IVC diam: 1.90 cm TAPSE (M-mode): 1.0 cm LEFT ATRIUM             Index        RIGHT ATRIUM           Index LA diam:        4.10 cm 2.13 cm/m   RA Area:      16.00 cm LA Vol (A2C):   41.7 ml 21.66 ml/m  RA Volume:   37.20 ml  19.32 ml/m LA Vol (A4C):   39.7 ml 20.62 ml/m LA Biplane Vol: 40.5 ml 21.03 ml/m  AORTIC VALVE LVOT Vmax:   87.80 cm/s LVOT Vmean:  56.200 cm/s LVOT VTI:    0.151 m  AORTA Ao Root diam: 3.60 cm Ao Asc diam:  3.40 cm MITRAL VALVE MV Area (PHT): 3.92 cm     SHUNTS MV Decel Time: 194 msec     Systemic VTI:  0.15 m MV E  velocity: 117.67 cm/s  Systemic Diam: 2.00 cm Dalton McleanMD Electronically signed by Franki Monte Signature Date/Time: 11/08/2021/5:04:42 PM    Final     Cardiac Studies   Echo 11/08/2021 1. Left ventricular ejection fraction, by estimation, is 55 to 60%. The  left ventricle has normal function. The left ventricle has no regional  wall motion abnormalities. There is moderate left ventricular hypertrophy.  Left ventricular diastolic  parameters are indeterminate.   2. Right ventricular systolic function is normal. The right ventricular  size is normal.   3. The mitral valve is normal in structure. No evidence of mitral valve  regurgitation. No evidence of mitral stenosis. Moderate mitral annular  calcification.   4. The aortic valve is tricuspid. Aortic valve regurgitation is not  visualized. Aortic valve sclerosis/calcification is present, without any  evidence of aortic stenosis.   5. The inferior vena cava is normal in size with greater than 50%  respiratory variability, suggesting right atrial pressure of 3 mmHg.   6. Technically difficult study with poor acoustic windows.   7. The patient was in atrial fibrillation.     Patient Profile     80 y.o. 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 Rogers Blocker.  Assessment & Plan    1. Acute kidney injury with hyponatremia, hypokalemia, hypochloremia - suspect related to volume depletion in the setting of diuretic dosing with furosemide with addition of metolazone; pt appears to have tipped over into dehydration - held diuretics, Farxiga - primary team is managing lytes and rehydration  - Scr is improving, 1.69 today  - Needs to review diuretic regimen at discharge.    2. New onset atrial fib RVR - CHADSVASC 8 for CHF, HTN, DM, age, prior stroke, vascular disease therefore felt to warrant anticoagulation at this time - Started heparin per pharmacy >> transition of NOAC post cath  - HR stable at 100s - Echo showed LVEF of 55-60%, no regional WM abnormality - TSH normal  - Continue coreg    3. NSTEMI with known history of CAD with prior PCIs, known occluded RCA - unclear if related to demand ischemia/atrial fib or de novo ACS - also has underlying substrate for angina with known occluded RCA - Hs troponin [id number] - On heparin per pharmacy - Continue Plavix without ASA given likely need for [facility] - For cath today, pending Chest x-ray - Scr is improving - Continue Coreg, Imdur, and statin   4. COPD with recent Covid-19 infection/CAP - further management per primary team - He was on 2 L oxygen at home, 3L here with stable breathing, able to lay with minimal head elevation.  - Will get 2 View portable chest -xray prior to cath    5. HLD - 09/20/2021: Cholesterol 133; HDL 46.20; LDL Cholesterol 67; Triglycerides 98.0; VLDL 19.6  - On Crestor 10mg  qd    For questions or updates, please contact [place] Please consult [email or web address] for contact info under        Jarrett Soho, PA  11/10/2021, 8:42 AM     Patient examined chart reviewed. Laying flat for me Lungs are rhonchorous Has been hydrated for renal failure Cr  improved 1.69 No chest pain or new murmur NSTEMI with known occluded RCA with  collaterals. On heparin for afib transition to [place] post cath. Check CXR portable this am He has COPD and is on home Oxygen 2L at baseline Sats > 90% at rest now Hopefully can proceed with cath latter today Discussed with Dr Hillery [name] Continue coreg , heparin, nitrates and plavix May need short term of triple Rx if intervention done BNP only 197 EF normal by TTE KVO fluids   Jenkins Rouge MD [facility]

## 2021-11-10 NOTE — Evaluation (Signed)
Occupational Therapy Evaluation Patient Details Name: [name]. MRN: [id number] DOB: 1941/08/30 Today's Date: 11/10/2021   History of Present Illness 80 y.o. male presents to [facility] hospital on 11/07/2021 with chest pain. Pt admitted for NSTEMI workup, also found to be in Afib. Pt with recent COVID and PNA diagnosis in October 2022. PMH includes coronary disease, COPD, chronic hypoxic respiratory failure, HTN, CKD, HLD, PVD, and DMII.   Clinical Impression   Pt was functioning modified independently in ADL, ambulating with a RW and primarily reliant on wife for IADL. He lives with his wife, who uses a walker and daughter who is bed bound from [place]. Pt is overall functioning at up to a supervision level. He demonstrates poor activity tolerance particularly since COVID. Will follow acutely. Pt is declining post acute OT.     Recommendations for follow up therapy are one component of a multi-disciplinary discharge planning process, led by the attending physician.  Recommendations may be updated based on patient status, additional functional criteria and insurance authorization.   Follow Up Recommendations  Other (comment) (pt declining HHOT)    Assistance Recommended at Discharge Intermittent Supervision/Assistance  Functional Status Assessment  Patient has had a recent decline in their functional status and demonstrates the ability to make significant improvements in function in a reasonable and predictable amount of time.  Equipment Recommendations  None recommended by OT    Recommendations for Other Services       Precautions / Restrictions Precautions Precautions: Fall Precaution Comments: monitor SpO2      Mobility Bed Mobility Overal bed mobility: Needs Assistance Bed Mobility: Supine to Sit;Sit to Supine     Supine to sit: Supervision Sit to supine: Supervision        Transfers Overall transfer level: Needs assistance Equipment used: Rolling walker (2  wheels) Transfers: Sit to/from Stand Sit to Stand: Supervision                  Balance Overall balance assessment: Needs assistance   Sitting balance-Leahy Scale: Good     Standing balance support: Reliant on assistive device for balance Standing balance-Leahy Scale: Poor                             ADL either performed or assessed with clinical judgement   ADL Overall ADL's : Needs assistance/impaired Eating/Feeding: Independent;Bed level   Grooming: Wash/dry hands;Standing;Supervision/safety   Upper Body Bathing: Set up;Sitting   Lower Body Bathing: Supervison/ safety;Sit to/from stand   Upper Body Dressing : Sitting   Lower Body Dressing: Supervision/safety;Sit to/from stand   Toilet Transfer: Supervision/safety;Ambulation;Rolling walker (2 wheels)   Toileting- Clothing Manipulation and Hygiene: Supervision/safety;Sit to/from stand       Functional mobility during ADLs: Supervision/safety;Rolling walker (2 wheels) General ADL Comments: Pt limited by decreased activity tolerance. He is knowledgeable in energy conservation strategies from previous cardiac rehb.     Vision Baseline Vision/History: 1 Wears glasses Ability to See in Adequate Light: 0 Adequate Patient Visual Report: No change from baseline       Perception     Praxis      Pertinent Vitals/Pain Pain Assessment: No/denies pain     Hand Dominance Right   Extremity/Trunk Assessment Upper Extremity Assessment Upper Extremity Assessment: Overall WFL for tasks assessed;Generalized weakness   Lower Extremity Assessment Lower Extremity Assessment: Defer to PT evaluation   Cervical / Trunk Assessment Cervical / Trunk Assessment: Normal   Communication Communication Communication:  No difficulties   Cognition Arousal/Alertness: Awake/alert Behavior During Therapy: WFL for tasks assessed/performed Overall Cognitive Status: Within Functional Limits for tasks assessed                                        General Comments       Exercises     Shoulder Instructions      Home Living Family/patient expects to be discharged to:: Private residence Living Arrangements: Spouse/significant other;Children (daughter is bedbound with [place], wife uses walker) Available Help at Discharge: Family;Available 24 hours/day Type of Home: House Home Access: Ramped entrance     Home Layout: One level     Bathroom Shower/Tub: Occupational psychologist: Standard     Home Equipment: Conservation officer, nature (2 wheels);Wheelchair - manual;Shower seat          Prior Functioning/Environment Prior Level of Function : Independent/Modified Independent             Mobility Comments: pt reports utilizing walker for community distances, has been utilizing more often over the last month ADLs Comments: sits to shower, wife does most IADL        OT Problem List: Decreased strength;Decreased activity tolerance;Impaired balance (sitting and/or standing);Decreased knowledge of use of DME or AE;Cardiopulmonary status limiting activity      OT Treatment/Interventions: Self-care/ADL training;DME and/or AE instruction;Patient/family education;Balance training;Therapeutic activities    OT Goals(Current goals can be found in the care plan section) Acute Rehab OT Goals OT Goal Formulation: With patient Time For Goal Achievement: 11/24/21 Potential to Achieve Goals: Good ADL Goals Pt Will Perform Grooming: with modified independence;standing (3 activities) Pt Will Perform Lower Body Bathing: with modified independence;sit to/from stand Pt Will Perform Lower Body Dressing: with modified independence;sit to/from stand Pt Will Transfer to Toilet: with modified independence;ambulating;regular height toilet Pt Will Perform Toileting - Clothing Manipulation and hygiene: with modified independence;sit to/from stand Additional ADL Goal #1: Pt will gather items necessary for  ADL around his room modified independently.  OT Frequency: Min 2X/week   Barriers to D/C:            Co-evaluation              AM-PAC OT "6 Clicks" Daily Activity     Outcome Measure Help from another person eating meals?: None Help from another person taking care of personal grooming?: A Little Help from another person toileting, which includes using toliet, bedpan, or urinal?: A Little Help from another person bathing (including washing, rinsing, drying)?: A Little Help from another person to put on and taking off regular upper body clothing?: None Help from another person to put on and taking off regular lower body clothing?: A Little 6 Click Score: 20   End of Session Equipment Utilized During Treatment: Rolling walker (2 wheels);Oxygen;Gait belt (3L) Nurse Communication: Other (comment) (IV alarming)  Activity Tolerance: Patient limited by fatigue Patient left: in bed;with call bell/phone within reach  OT Visit Diagnosis: Unsteadiness on feet (R26.81);Other abnormalities of gait and mobility (R26.89);Muscle weakness (generalized) (M62.81);Other (comment) (decreased activity tolerance)                Time: 1120-1145 OT Time Calculation (min): 25 min Charges:  OT General Charges $OT Visit: 1 Visit OT Evaluation $OT Eval Moderate Complexity: 1 Mod OT Treatments $Self Care/Home Management : 8-22 mins  Nestor Lewandowsky, OTR/L Acute Rehabilitation Services Pager: [phone] Office:  [phone]  Malka So 11/10/2021, 12:50 PM

## 2021-11-10 NOTE — Progress Notes (Addendum)
Initial Nutrition Assessment  DOCUMENTATION CODES:   Non-severe (moderate) malnutrition in context of acute illness/injury  INTERVENTION:   When diet advanced after procedure today, start Ensure Enlive po TID, each supplement provides 350 kcal and 20 grams of protein. Continue B-complex with C, Calcitriol, cholecalciferol, ferrous sulfate. Continue MVI with minerals daily.  NUTRITION DIAGNOSIS:   Moderate Malnutrition related to acute illness (COVID-19) as evidenced by mild fat depletion, mild muscle depletion.  GOAL:   Patient will meet greater than or equal to 90% of their needs  MONITOR:   PO intake, Supplement acceptance, Labs  REASON FOR ASSESSMENT:   Consult Assessment of nutrition requirement/status  ASSESSMENT:   80 yo male admitted with New onset A fib, AKI on CKD stage 3. PMH includes CAD, COPD, HTN, CKD, HLD, PVD, DM-2, recent COVID-19, lap band gastric bypass 2012.  Patient reports his last band adjustment was ~3 years ago. At home, he takes a One-A-Day multivitamin, B-vitamin, and vitamin D.  For the past 6 weeks, his appetite and intake has been very poor. He attributes this to COVID-19. Recent dx of PNA as an outpatient. He typically drinks a Premier Protein drink for breakfast and snacks on nutritious foods throughout the day. He does not eat fast food or fried foods. He limits his sodium intake.   Patient reports that he has lost 10-15 lbs over the past month. Weight history reviewed. 6% weight loss within the past month is severe. Weight loss could be related to dehydration on admission. Discussed the importance of adequate calorie and protein intake for LBM preservation. Since admission, he has eaten breakfast well, but has picked at his other meals. He agreed to drink PO supplements between meals to maximize intake.   Labs reviewed. Na 134 CBG: 267-186  Medications reviewed and include B-complex with vitamin C, calcitriol, vitamin D3, ferrous sulfate,  Novolog, MVI with minerals.  Currently NPO for cardiac cath today.  NUTRITION - FOCUSED PHYSICAL EXAM:  Flowsheet Row Most Recent Value  Orbital Region Mild depletion  Upper Arm Region Mild depletion  Thoracic and Lumbar Region Mild depletion  Buccal Region Mild depletion  Temple Region Mild depletion  Clavicle Bone Region Mild depletion  Clavicle and Acromion Bone Region Mild depletion  Scapular Bone Region No depletion  Dorsal Hand Mild depletion  Patellar Region No depletion  Anterior Thigh Region No depletion  Posterior Calf Region Mild depletion  Edema (RD Assessment) None  Hair Reviewed  Eyes Reviewed  Mouth Reviewed  Skin Reviewed  Nails Reviewed       Diet Order:   Diet Order             Diet NPO time specified  Diet effective now                   EDUCATION NEEDS:   Education needs have been addressed  Skin:  Skin Assessment: Reviewed RN Assessment  Last BM:  11/17 type 4, 6  Height:   Ht Readings from Last 1 Encounters:  11/07/21 5\' 3"  (1.6 m)    Weight:   Wt Readings from Last 1 Encounters:  11/10/21 89.7 kg    BMI:  Body mass index is 35.03 kg/m.  Estimated Nutritional Needs:   Kcal:  2200-2400  Protein:  115-130 gm  Fluid:  2.2-2.4 L    Lucas Mallow, RD, LDN, CNSC Please refer to Amion for contact information.

## 2021-11-10 NOTE — Progress Notes (Signed)
PROGRESS NOTE    [name].  OZH:[id number] DOB: 04/05/1941 DOA: 11/07/2021 PCP: Haydee Salter, MD    Brief Narrative:  [name] was admitted to the hospital with the working diagnosis of NEW onset atrial fibrillation complicated with AKI on CKD stage 3b, and elevated troponin.    80 year old [name] past medical history for coronary disease, COPD, chronic hypoxic respiratory failure, hypertension, chronic kidney disease, dyslipidemia, peripheral vascular disease, and type 2 diabetes mellitus who presented with chest pain.  [name] had acute onset of chest pain that woke [name] up from sleep.  8/10 intensity, sharp in nature, constant, associated with diaphoresis and dyspnea.  Because of severe symptoms [name] came to the hospital for further evaluation.   10/16/2021 diagnosed with COVID-19, received treatment with Paxlovid    Recent outpatient evaluation [name] was diagnosed with pneumonia, placed on levofloxacin and steroids.  The dose of his diuretic therapy was increased. On his initial physical examination his blood pressure was 142/73, heart rate [name], respirate 20, oxygen saturation 98%, [name] was afebrile.  His lungs are clear to auscultation bilaterally, no wheezing, rales or rhonchi, heart S1-S2, irregularly irregular, abdomen soft, positive trace lower extremity edema.   Sodium 132, potassium 3.3, chloride 82, bicarb 34, glucose 111, BUN 98, creatinine 2.69, BNP 197, high sensitive troponin [id number] SARS COVID-19 positive.   Chest radiograph with bibasilar atelectasis.   EKG 122 bpm, normal axis, normal intervals, atrial fibrillation rhythm, no significant ST segment or T wave changes.   [name] was placed on carvedilol for rate control and anticoagulation with heparin.  Renal function has been improving, Plan for possible coronary angiography 11/10/21   Assessment & Plan:   Principal Problem:   Atrial fibrillation ([place]) Active Problems:   Controlled diabetes mellitus type 2 with  complications (HCC)   Dyslipidemia   Hypokalemia   Iron deficiency anemia   Essential hypertension   Coronary artery disease   Chronic diastolic heart failure (HCC)   Hemiplegia, 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HCC)   Elevated troponin   Chronic respiratory failure with hypoxia and hypercapnia (HCC)   COPD with chronic bronchitis (HCC)   History of stroke   Acute renal failure superimposed on stage 3b chronic kidney disease (HCC)   Increased anion gap metabolic acidosis   Malnutrition of moderate degree     New onset atrial fibrillation/ chronic diastolic heart failure.  Echocardiogram with preserved LV systolic function with EF 55 to 60%, no well motion abnormalities.    Rate control with carvedilol 25 mg po bid, HR has been range of about 95 to 115 bpm continue atrial fibrillation, personally reviewed telemetry.  No clinical signs of volume overload or acute heart failure decompensation.    Elevated cardiac makers on admission, plan for further work up with coronary angiography today.  [name] on clopidogrel and had one dose of aspirin today.   2. AKI on CKD stage 3b/ hypokalemia/ hyponatremia (base serum cr is 1.6 old records personally reviewed)  Renal function with serum cr at 1.69 with k at 3,6 and serum bicarbonate at 36.  No signs of volume overload. Plan to continue to hold on IV fluids for now. At home on diuretic therapy furosemide 120 mg in am and 80 mg pm, plus metolazone 2,5 mg per day.    Metabolic bone disease, continue with calcitriol.   Follow up renal function in am, avoid hypotension and nephrotoxic medications.    3. COPD, chronic hypoxemic respiratory failure (uses home 02 at night  at home)  Pneumonia has been ruled out. [name] tested positive for COVID 19, but had a positive test on 10/26, no signs of acute viral illness, no isolation.    No frank COPD exacerbation, his cough has improved with antitussive agents.  Continue with bronchodilator  therapy and inhaled corticosteroids   Continue with supplemental 02 per [place] to keep oxygen saturation 88% or greater.    4. HTN  Continue with isosorbide and carvedilol.    5. T2DM/ dyslipidemia   Positive hyperglycemia, his fasting glucose this am is 218, capillary has been 193, 186, 267, 163, 306, 405.  [name] at home on dapagliflozin and dulaglutide   Plan to continue with insulin sliding scale for now, plan to resume oral hypoglycemic agents at discharge.  Continue with rosuvastatin,.    6. Hx of CVA/ depression  On clopidogrel and rosuvastatin  Continue to encourage to be out of bed to chair tid with meals,  On citalopram.     7. Anemia of chronic disease with iron deficiency. Moderate calorie protein malnutrition Continue with iron supplementation  On nutritional supplementation    8. Gout.  No signs of acute exacerbation   [name] continue to be at high risk for worsening atrial fibrillation   Status is: Inpatient  Remains inpatient appropriate because: pending cardiac cath and coronary angiography    DVT prophylaxis: Heparin   Code Status:    full  Family Communication:   No family at the bedside      Nutrition Status: Nutrition Problem: Moderate Malnutrition Etiology: acute illness (COVID-19) Signs/Symptoms: mild fat depletion, mild muscle depletion Interventions: Ensure Enlive (each supplement provides 350kcal and 20 grams of protein), MVI     Consultants:  Cardiology     Subjective: [name] with no nausea or vomiting, no chest pain, dyspnea and cough have been improving,   Objective: Vitals:   11/09/21 2124 11/10/21 0511 11/10/21 0711 11/10/21 0856  BP: 125/71 (!) 118/41 (!) 125/92   Pulse: 80 98 79   Resp: 15 19 18    Temp: 98.3 F (36.8 C) 98.4 F (36.9 C) 97.6 F (36.4 C)   TempSrc: Oral Oral Oral   SpO2: 100%  96% 95%  Weight:  89.7 kg    Height:        Intake/Output Summary (Last 24 hours) at 11/10/2021 1241 Last data filed at  11/10/2021 0600 Gross per 24 hour  Intake 991.22 ml  Output 525 ml  Net 466.22 ml   Filed Weights   11/07/21 1207 11/09/21 0427 11/10/21 0511  Weight: 89.8 kg 90.4 kg 89.7 kg    Examination:   General: Not in pain or dyspnea,  Neurology: Awake and alert, non focal  E ENT: mild pallor, no icterus, oral mucosa moist Cardiovascular: No JVD. S1-S2 present, rhythmic, no gallops, rubs, or murmurs. Trace lower extremity edema. Pulmonary: positive breath sounds bilaterally, with no wheezing, or rhonchi scattered rales. Gastrointestinal. Abdomen soft and non tender Skin. No rashes Musculoskeletal: no joint deformities     Data Reviewed: I have personally reviewed following labs and imaging studies  CBC: Recent Labs  Lab 11/07/21 1223 11/08/21 0339 11/09/21 0338 11/10/21 0155  WBC 11.0* 9.9 10.4 9.9  HGB 14.3 12.8* 13.7 13.0  HCT 42.8 39.4 41.3 39.5  MCV 90.7 91.4 91.0 92.1  PLT 412* 306 289 333   Basic Metabolic Panel: Recent Labs  Lab 11/07/21 1223 11/07/21 1628 11/08/21 0339 11/09/21 0335 11/09/21 0338 11/10/21 0155  NA 130* 132* 131*  --  134* 134*  K 2.7* 3.3* 3.0*  --  3.2* 3.6  CL 76* 82* 85*  --  87* 90*  CO2 35* 34* 33*  --  36* 36*  GLUCOSE 259* 111* 206* 181* 189* 218*  BUN 93* 98* 94*  --  67* 54*  CREATININE 2.65* 2.69* 2.45*  --  1.76* 1.69*  CALCIUM 9.7 9.5 9.2  --  10.0 10.3  MG  --  2.5*  --   --  2.4  --    GFR: Estimated Creatinine Clearance: 35.1 mL/min (A) (by C-G formula based on SCr of 1.69 mg/dL (H)). Liver Function Tests: Recent Labs  Lab 11/08/21 0339  AST 28  ALT 24  ALKPHOS 61  BILITOT 0.6  PROT 5.7*  ALBUMIN 2.7*   No results for input(s): LIPASE, AMYLASE in the last 168 hours. No results for input(s): AMMONIA in the last 168 hours. Coagulation Profile: Recent Labs  Lab 11/08/21 0207  INR 1.2   Cardiac Enzymes: No results for input(s): CKTOTAL, CKMB, CKMBINDEX, TROPONINI in the last 168 hours. BNP (last 3  results) Recent Labs    11/03/21 1220  PROBNP 168.0*   HbA1C: No results for input(s): HGBA1C in the last [name] hours. CBG: Recent Labs  Lab 11/09/21 1305 11/09/21 1635 11/09/21 2118 11/10/21 0725 11/10/21 1121  GLUCAP 306* 163* 267* 186* 193*   Lipid Profile: No results for input(s): CHOL, HDL, LDLCALC, TRIG, CHOLHDL, LDLDIRECT in the last [name] hours. Thyroid Function Tests: Recent Labs    11/07/21 1628  TSH 2.937   Anemia Panel: No results for input(s): VITAMINB12, FOLATE, FERRITIN, TIBC, IRON, RETICCTPCT in the last [name] hours.    Radiology Studies: I have reviewed all of the imaging during this hospital visit personally     Scheduled Meds:  B-complex with vitamin C  1 tablet Oral Daily   calcitRIOL  0.25 mcg Oral Daily   carvedilol  25 mg Oral BID WC   chlorpheniramine-HYDROcodone  5 mL Oral Q12H   cholecalciferol  1,000 Units Oral Daily   citalopram  10 mg Oral Daily   clopidogrel  75 mg Oral Daily   feeding supplement  237 mL Oral TID BM   ferrous sulfate  325 mg Oral Q breakfast   finasteride  5 mg Oral Daily   insulin aspart  0-15 Units Subcutaneous TID AC & HS   ipratropium-albuterol  3 mL Nebulization Q6H   isosorbide mononitrate  60 mg Oral Daily   mometasone-formoterol  2 puff Inhalation BID   multivitamin with minerals   Oral Daily   pantoprazole  40 mg Oral Daily   rosuvastatin  10 mg Oral Daily   sodium chloride flush  3 mL Intravenous Q12H   traZODone  100 mg Oral QHS   Continuous Infusions:  sodium chloride     sodium chloride 1 mL/kg/hr (11/10/21 0538)   heparin 1,000 Units/hr (11/09/21 1725)     LOS: 2 days        Leston Schueller Gerome Apley, MD

## 2021-11-10 NOTE — Progress Notes (Signed)
Inpatient Diabetes Program Recommendations  AACE/ADA: New Consensus Statement on Inpatient Glycemic Control   Target Ranges:  Prepandial:   less than 140 mg/dL      Peak postprandial:   less than 180 mg/dL (1-2 hours)      Critically ill patients:  140 - 180 mg/dL    Latest Reference Range & Units 11/09/21 08:09 11/09/21 11:45 11/09/21 13:05 11/09/21 16:35 11/09/21 21:18 11/10/21 07:25  Glucose-Capillary 70 - 99 mg/dL 221 (H) 405 (H) 306 (H) 163 (H) 267 (H) 186 (H)  (H): Data is abnormally high  Review of Glycemic Control  Current orders for Inpatient glycemic control: Novolog 0-15 units AC&HS  Inpatient Diabetes Program Recommendations:    Insulin: Please consider ordering Semglee 5 units daily ordering Novolog 3 units TID with meals for meal coverage if patient eats at least 50% of meals.  Thanks, Barnie Alderman, RN, MSN, CDE Diabetes Coordinator Inpatient Diabetes Program [phone] (Team Pager from 8am to 5pm)

## 2021-11-10 NOTE — Progress Notes (Signed)
SITE AREA: right groin/femoral  SITE PRIOR TO REMOVAL:  LEVEL 0  PRESSURE APPLIED FOR: approximately 21 minutes  MANUAL: yes  PATIENT STATUS DURING PULL: stable   POST PULL SITE:  LEVEL 0  POST PULL INSTRUCTIONS GIVEN: yes  POST PULL PULSES PRESENT: right pedal pulse dopplerable  DRESSING APPLIED: gauze with tegaderm  BEDREST BEGINS @ 1618   COMMENTS:

## 2021-11-10 NOTE — Interval H&P Note (Signed)
History and Physical Interval Note:  11/10/2021 2:03 PM  [name].  has presented today for surgery, with the diagnosis of chest pain.  The various methods of treatment have been discussed with the patient and family. After consideration of risks, benefits and other options for treatment, the patient has consented to  Procedure(s): LEFT HEART CATH AND CORONARY ANGIOGRAPHY (N/A) as a surgical intervention.  The patient's history has been reviewed, patient examined, no change in status, stable for surgery.  I have reviewed the patient's chart and labs.  Questions were answered to the patient's satisfaction.    Cath Lab Visit (complete for each Cath Lab visit)  Clinical Evaluation Leading to the Procedure:   ACS: Yes.    Non-ACS:    Anginal Classification: CCS III  Anti-ischemic medical therapy: Minimal Therapy (1 class of medications)  Non-Invasive Test Results: No non-invasive testing performed  Prior CABG: No previous CABG        Early Osmond

## 2021-11-10 NOTE — H&P (View-Only) (Signed)
Progress Note  Patient Name: [name]. Date of Encounter: 11/10/2021  [facility] Cardiologist: Lauree Chandler, MD   Subjective   Breathing stable on 3L oxygen. No chest pain.   Inpatient Medications    Scheduled Meds:  B-complex with vitamin C  1 tablet Oral Daily   calcitRIOL  0.25 mcg Oral Daily   carvedilol  25 mg Oral BID WC   chlorpheniramine-HYDROcodone  5 mL Oral Q12H   cholecalciferol  1,000 Units Oral Daily   citalopram  10 mg Oral Daily   clopidogrel  75 mg Oral Daily   ferrous sulfate  325 mg Oral Q breakfast   finasteride  5 mg Oral Daily   insulin aspart  0-15 Units Subcutaneous TID AC & HS   ipratropium-albuterol  3 mL Nebulization Q6H   isosorbide mononitrate  60 mg Oral Daily   mometasone-formoterol  2 puff Inhalation BID   multivitamin with minerals   Oral Daily   pantoprazole  40 mg Oral Daily   rosuvastatin  10 mg Oral Daily   sodium chloride flush  3 mL Intravenous Q12H   traZODone  100 mg Oral QHS   Continuous Infusions:  sodium chloride     sodium chloride 1 mL/kg/hr (11/10/21 0538)   heparin 1,000 Units/hr (11/09/21 1725)   PRN Meds: sodium chloride, acetaminophen **OR** acetaminophen, albuterol, guaiFENesin-dextromethorphan, nitroGLYCERIN, sodium chloride flush   Vital Signs    Vitals:   11/09/21 1632 11/09/21 2124 11/10/21 0511 11/10/21 0711  BP:  125/71 (!) 118/41 (!) 125/92  Pulse:  80 98 79  Resp:  15 19 18   Temp:  98.3 F (36.8 C) 98.4 F (36.9 C) 97.6 F (36.4 C)  TempSrc:  Oral Oral Oral  SpO2: 99% 100%  96%  Weight:   89.7 kg   Height:        Intake/Output Summary (Last 24 hours) at 11/10/2021 0842 Last data filed at 11/10/2021 0600 Gross per 24 hour  Intake 991.22 ml  Output 1050 ml  Net -58.78 ml   Last 3 Weights 11/10/2021 11/09/2021 11/07/2021  Weight (lbs) 197 lb 12 oz 199 lb 6.4 oz 198 lb  Weight (kg) 89.7 kg 90.447 kg 89.812 kg      Telemetry    Afib at 100s - Personally  Reviewed  ECG    N/A  Physical Exam   GEN: No acute distress.   Neck: No JVD Cardiac: IR IR , no murmurs, rubs, or gallops.  Respiratory: course breath sound  GI: Soft, nontender, non-distended  MS: No edema; No deformity. Neuro:  Nonfocal  Psych: Normal affect   Labs    High Sensitivity Troponin:   Recent Labs  Lab 11/07/21 1223 11/07/21 1413 11/07/21 1730 11/08/21 0339  TROPONINIHS 30* 589* 2,957* 3,932*     Chemistry Recent Labs  Lab 11/07/21 1628 11/08/21 0339 11/09/21 0335 11/09/21 0338 11/10/21 0155  NA 132* 131*  --  134* 134*  K 3.3* 3.0*  --  3.2* 3.6  CL 82* 85*  --  87* 90*  CO2 34* 33*  --  36* 36*  GLUCOSE 111* 206* 181* 189* 218*  BUN 98* 94*  --  67* 54*  CREATININE 2.69* 2.45*  --  1.76* 1.69*  CALCIUM 9.5 9.2  --  10.0 10.3  MG 2.5*  --   --  2.4  --   PROT  --  5.7*  --   --   --   ALBUMIN  --  2.7*  --   --   --  AST  --  28  --   --   --   ALT  --  24  --   --   --   ALKPHOS  --  61  --   --   --   BILITOT  --  0.6  --   --   --   GFRNONAA 23* 26*  --  39* 41*  ANIONGAP 16* 13  --  11 8    Lipids No results for input(s): CHOL, TRIG, HDL, LABVLDL, LDLCALC, CHOLHDL in the last 168 hours.  Hematology Recent Labs  Lab 11/08/21 0339 11/09/21 0338 11/10/21 0155  WBC 9.9 10.4 9.9  RBC 4.31 4.54 4.29  HGB 12.8* 13.7 13.0  HCT 39.4 41.3 39.5  MCV 91.4 91.0 92.1  MCH 29.7 30.2 30.3  MCHC 32.5 33.2 32.9  RDW 13.7 13.6 13.7  PLT 306 289 256   Thyroid  Recent Labs  Lab 11/07/21 1628  TSH 2.937    BNP Recent Labs  Lab 11/03/21 1220 11/07/21 1628  BNP  --  197.4*  PROBNP 168.0*  --     DDimer No results for input(s): DDIMER in the last 168 hours.   Radiology    ECHOCARDIOGRAM COMPLETE  Result Date: 11/08/2021    ECHOCARDIOGRAM REPORT   Patient Name:   Courage Biglow. Date of Exam: 11/08/2021 Medical Rec #:  [id number]         Height:       63.0 in Accession #:    [id number]        Weight:       198.0 lb Date of Birth:   1941-12-04         BSA:          1.925 m Patient Age:    24 years          BP:           121/86 mmHg Patient Gender: M                 HR:           85 bpm. Exam Location:  Inpatient Procedure: 2D Echo, Cardiac Doppler and Color Doppler Indications:    I48.91* Unspeicified atrial fibrillation  History:        Patient has prior history of Echocardiogram examinations, most                 recent 02/04/2018. CHF, Previous Myocardial Infarction and CAD,                 Abnormal ECG, Stroke, Arrythmias:Atrial Fibrillation; Risk                 Factors:Former Smoker, Hypertension, Diabetes and Dyslipidemia.  Sonographer:    Roseanna Rainbow RDCS Referring Phys: [id number] [facility]  Sonographer Comments: Technically difficult study due to poor echo windows. IMPRESSIONS  1. Left ventricular ejection fraction, by estimation, is 55 to 60%. The left ventricle has normal function. The left ventricle has no regional wall motion abnormalities. There is moderate left ventricular hypertrophy. Left ventricular diastolic parameters are indeterminate.  2. Right ventricular systolic function is normal. The right ventricular size is normal.  3. The mitral valve is normal in structure. No evidence of mitral valve regurgitation. No evidence of mitral stenosis. Moderate mitral annular calcification.  4. The aortic valve is tricuspid. Aortic valve regurgitation is not visualized. Aortic valve sclerosis/calcification is present, without any evidence of aortic stenosis.  5. The inferior  vena cava is normal in size with greater than 50% respiratory variability, suggesting right atrial pressure of 3 mmHg.  6. Technically difficult study with poor acoustic windows.  7. The patient was in atrial fibrillation. FINDINGS  Left Ventricle: Left ventricular ejection fraction, by estimation, is 55 to 60%. The left ventricle has normal function. The left ventricle has no regional wall motion abnormalities. The left ventricular internal cavity size was normal  in size. There is  moderate left ventricular hypertrophy. Left ventricular diastolic parameters are indeterminate. Right Ventricle: The right ventricular size is normal. No increase in right ventricular wall thickness. Right ventricular systolic function is normal. Left Atrium: Left atrial size was normal in size. Right Atrium: Right atrial size was normal in size. Pericardium: There is no evidence of pericardial effusion. Mitral Valve: The mitral valve is normal in structure. There is mild calcification of the mitral valve leaflet(s). Moderate mitral annular calcification. No evidence of mitral valve regurgitation. No evidence of mitral valve stenosis. Tricuspid Valve: The tricuspid valve is normal in structure. Tricuspid valve regurgitation is not demonstrated. Aortic Valve: The aortic valve is tricuspid. Aortic valve regurgitation is not visualized. Aortic valve sclerosis/calcification is present, without any evidence of aortic stenosis. Pulmonic Valve: The pulmonic valve was normal in structure. Pulmonic valve regurgitation is not visualized. Aorta: The aortic root is normal in size and structure. Venous: The inferior vena cava is normal in size with greater than 50% respiratory variability, suggesting right atrial pressure of 3 mmHg. IAS/Shunts: No atrial level shunt detected by color flow Doppler.  LEFT VENTRICLE PLAX 2D LVIDd:         5.15 cm LVIDs:         3.80 cm LV PW:         1.35 cm LV IVS:        1.55 cm LVOT diam:     2.00 cm LV SV:         47 LV SV Index:   25 LVOT Area:     3.14 cm  LV Volumes (MOD) LV vol d, MOD A2C: 44.5 ml LV vol d, MOD A4C: 47.5 ml LV vol s, MOD A2C: 19.0 ml LV vol s, MOD A4C: 16.5 ml LV SV MOD A2C:     25.5 ml LV SV MOD A4C:     47.5 ml LV SV MOD BP:      28.1 ml RIGHT VENTRICLE            IVC RV S prime:     4.80 cm/s  IVC diam: 1.90 cm TAPSE (M-mode): 1.0 cm LEFT ATRIUM             Index        RIGHT ATRIUM           Index LA diam:        4.10 cm 2.13 cm/m   RA Area:      16.00 cm LA Vol (A2C):   41.7 ml 21.66 ml/m  RA Volume:   37.20 ml  19.32 ml/m LA Vol (A4C):   39.7 ml 20.62 ml/m LA Biplane Vol: 40.5 ml 21.03 ml/m  AORTIC VALVE LVOT Vmax:   87.80 cm/s LVOT Vmean:  56.200 cm/s LVOT VTI:    0.151 m  AORTA Ao Root diam: 3.60 cm Ao Asc diam:  3.40 cm MITRAL VALVE MV Area (PHT): 3.92 cm     SHUNTS MV Decel Time: 194 msec     Systemic VTI:  0.15 m MV E  velocity: 117.67 cm/s  Systemic Diam: 2.00 cm Dalton McleanMD Electronically signed by Franki Monte Signature Date/Time: 11/08/2021/5:04:42 PM    Final     Cardiac Studies   Echo 11/08/2021 1. Left ventricular ejection fraction, by estimation, is 55 to 60%. The  left ventricle has normal function. The left ventricle has no regional  wall motion abnormalities. There is moderate left ventricular hypertrophy.  Left ventricular diastolic  parameters are indeterminate.   2. Right ventricular systolic function is normal. The right ventricular  size is normal.   3. The mitral valve is normal in structure. No evidence of mitral valve  regurgitation. No evidence of mitral stenosis. Moderate mitral annular  calcification.   4. The aortic valve is tricuspid. Aortic valve regurgitation is not  visualized. Aortic valve sclerosis/calcification is present, without any  evidence of aortic stenosis.   5. The inferior vena cava is normal in size with greater than 50%  respiratory variability, suggesting right atrial pressure of 3 mmHg.   6. Technically difficult study with poor acoustic windows.   7. The patient was in atrial fibrillation.     Patient Profile     80 y.o. 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 Rogers Blocker.  Assessment & Plan    1. Acute kidney injury with hyponatremia, hypokalemia, hypochloremia - suspect related to volume depletion in the setting of diuretic dosing with furosemide with addition of metolazone; pt appears to have tipped over into dehydration - held diuretics, Farxiga - primary team is managing lytes and rehydration  - Scr is improving, 1.69 today  - Needs to review diuretic regimen at discharge.    2. New onset atrial fib RVR - CHADSVASC 8 for CHF, HTN, DM, age, prior stroke, vascular disease therefore felt to warrant anticoagulation at this time - Started heparin per pharmacy >> transition of NOAC post cath  - HR stable at 100s - Echo showed LVEF of 55-60%, no regional WM abnormality - TSH normal  - Continue coreg    3. NSTEMI with known history of CAD with prior PCIs, known occluded RCA - unclear if related to demand ischemia/atrial fib or de novo ACS - also has underlying substrate for angina with known occluded RCA - Hs troponin [id number] - On heparin per pharmacy - Continue Plavix without ASA given likely need for [facility] - For cath today, pending Chest x-ray - Scr is improving - Continue Coreg, Imdur, and statin   4. COPD with recent Covid-19 infection/CAP - further management per primary team - He was on 2 L oxygen at home, 3L here with stable breathing, able to lay with minimal head elevation.  - Will get 2 View portable chest -xray prior to cath    5. HLD - 09/20/2021: Cholesterol 133; HDL 46.20; LDL Cholesterol 67; Triglycerides 98.0; VLDL 19.6  - On Crestor 10mg  qd    For questions or updates, please contact [place] Please consult [email or web address] for contact info under        Jarrett Soho, PA  11/10/2021, 8:42 AM     Patient examined chart reviewed. Laying flat for me Lungs are rhonchorous Has been hydrated for renal failure Cr  improved 1.69 No chest pain or new murmur NSTEMI with known occluded RCA with  collaterals. On heparin for afib transition to [address] post cath. Check CXR portable this am He has COPD and is on home Oxygen 2L at baseline Sats > 90% at rest now Hopefully can proceed with cath latter today Discussed with Dr Hillery [name] Continue coreg , heparin, nitrates and plavix May need short term of triple Rx if intervention done BNP only 197 EF normal by TTE KVO fluids   Jenkins Rouge MD [facility]

## 2021-11-11 ENCOUNTER — Inpatient Hospital Stay (HOSPITAL_COMMUNITY): Payer: Medicare Other

## 2021-11-11 DIAGNOSIS — J9611 Chronic respiratory failure with hypoxia: Secondary | ICD-10-CM | POA: Diagnosis not present

## 2021-11-11 DIAGNOSIS — I4891 Unspecified atrial fibrillation: Secondary | ICD-10-CM

## 2021-11-11 DIAGNOSIS — N179 Acute kidney failure, unspecified: Secondary | ICD-10-CM | POA: Diagnosis not present

## 2021-11-11 DIAGNOSIS — I5032 Chronic diastolic (congestive) heart failure: Secondary | ICD-10-CM | POA: Diagnosis not present

## 2021-11-11 LAB — BASIC METABOLIC PANEL
Anion gap: 10 (ref 5–15)
BUN: 47 mg/dL — ABNORMAL HIGH (ref 8–23)
CO2: 31 mmol/L (ref 22–32)
Calcium: 9.7 mg/dL (ref 8.9–10.3)
Chloride: 91 mmol/L — ABNORMAL LOW (ref 98–111)
Creatinine, Ser: 1.66 mg/dL — ABNORMAL HIGH (ref 0.61–1.24)
GFR, Estimated: 42 mL/min — ABNORMAL LOW (ref >60)
Glucose, Bld: 290 mg/dL — ABNORMAL HIGH (ref 70–99)
Potassium: 4.3 mmol/L (ref 3.5–5.1)
Sodium: 132 mmol/L — ABNORMAL LOW (ref 135–145)

## 2021-11-11 LAB — CBC
HCT: 38.2 % — ABNORMAL LOW (ref 39.0–52.0)
Hemoglobin: 12.3 g/dL — ABNORMAL LOW (ref 13.0–17.0)
MCH: 30.4 pg (ref 26.0–34.0)
MCHC: 32.2 g/dL (ref 30.0–36.0)
MCV: 94.6 fL (ref 80.0–100.0)
Platelets: 218 10*3/uL (ref 150–400)
RBC: 4.04 MIL/uL — ABNORMAL LOW (ref 4.22–5.81)
RDW: 13.9 % (ref 11.5–15.5)
WBC: 9.6 10*3/uL (ref 4.0–10.5)
nRBC: 0 % (ref 0.0–0.2)

## 2021-11-11 LAB — GLUCOSE, CAPILLARY
Glucose-Capillary: 286 mg/dL — ABNORMAL HIGH (ref 70–99)
Glucose-Capillary: 213 mg/dL — ABNORMAL HIGH (ref 70–99)
Glucose-Capillary: 342 mg/dL — ABNORMAL HIGH (ref 70–99)
Glucose-Capillary: 279 mg/dL — ABNORMAL HIGH (ref 70–99)

## 2021-11-11 LAB — HEPARIN LEVEL (UNFRACTIONATED): Heparin Unfractionated: 0.11 IU/mL — ABNORMAL LOW (ref 0.30–0.70)

## 2021-11-11 MED ORDER — INSULIN GLARGINE-YFGN 100 UNIT/ML ~~LOC~~ SOLN
10.0000 [IU] | Freq: Every day | SUBCUTANEOUS | Status: DC
  Administered 2021-11-11 – 2021-11-13 (×3): 10 [IU] via SUBCUTANEOUS
  Filled 2021-11-11 (×3): qty 0.1

## 2021-11-11 MED ORDER — PHENOL 1.4 % MT LIQD
1.0000 | OROMUCOSAL | Status: DC | PRN
  Administered 2021-11-11 – 2021-11-12 (×2): 1 via OROMUCOSAL
  Filled 2021-11-11: qty 177

## 2021-11-11 MED ORDER — HEPARIN (PORCINE) 25000 UT/250ML-% IV SOLN: 1250.0000 [IU]/h | INTRAVENOUS | Status: DC

## 2021-11-11 MED ORDER — DAPAGLIFLOZIN PROPANEDIOL 5 MG PO TABS
5.0000 mg | ORAL_TABLET | Freq: Every day | ORAL | Status: DC
  Administered 2021-11-11 – 2021-11-12 (×2): 5 mg via ORAL
  Filled 2021-11-11 (×2): qty 1

## 2021-11-11 MED ORDER — APIXABAN 5 MG PO TABS
5.0000 mg | ORAL_TABLET | Freq: Two times a day (BID) | ORAL | Status: DC
  Administered 2021-11-11 – 2021-11-14 (×7): 5 mg via ORAL
  Filled 2021-11-11 (×7): qty 1

## 2021-11-11 NOTE — Progress Notes (Signed)
Progress Note  Patient Name: [name]. Date of Encounter: 11/11/2021  [facility] Cardiologist: Lauree Chandler, MD   Subjective   Respiratory status remained stable.  He remains in atrial fibrillation.  His main complaint is fatigue and weakness.  Inpatient Medications    Scheduled Meds:  B-complex with vitamin C  1 tablet Oral Daily   calcitRIOL  0.25 mcg Oral Daily   carvedilol  25 mg Oral BID WC   chlorpheniramine-HYDROcodone  5 mL Oral Q12H   cholecalciferol  1,000 Units Oral Daily   citalopram  10 mg Oral Daily   clopidogrel  75 mg Oral Daily   feeding supplement  237 mL Oral TID BM   ferrous sulfate  325 mg Oral Q breakfast   finasteride  5 mg Oral Daily   insulin aspart  0-15 Units Subcutaneous TID AC & HS   ipratropium-albuterol  3 mL Nebulization Q6H   isosorbide mononitrate  60 mg Oral Daily   mometasone-formoterol  2 puff Inhalation BID   multivitamin with minerals   Oral Daily   pantoprazole  40 mg Oral Daily   rosuvastatin  10 mg Oral Daily   sodium chloride flush  3 mL Intravenous Q12H   sodium chloride flush  3 mL Intravenous Q12H   traZODone  100 mg Oral QHS   Continuous Infusions:  sodium chloride     heparin     PRN Meds: sodium chloride, acetaminophen **OR** acetaminophen, acetaminophen, albuterol, guaiFENesin-dextromethorphan, nitroGLYCERIN, ondansetron (ZOFRAN) IV, sodium chloride flush   Vital Signs    Vitals:   11/11/21 0437 11/11/21 0744 11/11/21 0749 11/11/21 0837  BP: 117/69 114/88 114/88 114/88  Pulse: 99 94 94 87  Resp: 18 18 18 18   Temp: 97.8 F (36.6 C) 98.1 F (36.7 C) 98 F (36.7 C) 98 F (36.7 C)  TempSrc: Oral Oral    SpO2: 95% 100%    Weight: 92.5 kg     Height:        Intake/Output Summary (Last 24 hours) at 11/11/2021 1002 Last data filed at 11/11/2021 0724 Gross per 24 hour  Intake --  Output 925 ml  Net -925 ml    Last 3 Weights 11/11/2021 11/10/2021 11/09/2021  Weight (lbs) 204 lb 197 lb  12 oz 199 lb 6.4 oz  Weight (kg) 92.534 kg 89.7 kg 90.447 kg      Telemetry    Atrial fibrillation, rates 80s to low 100s  ECG      Physical Exam   GEN: Well nourished, well developed, in no acute distress  HEENT: normal  Neck: no JVD, carotid bruits, or masses Cardiac: irregular; no murmurs, rubs, or gallops,no edema  Respiratory:  clear to auscultation bilaterally, normal work of breathing GI: soft, nontender, nondistended, + BS MS: no deformity or atrophy  Skin: warm and dry Neuro:  Strength and sensation are intact Psych: euthymic mood, full affect   Labs    High Sensitivity Troponin:   Recent Labs  Lab 11/07/21 1223 11/07/21 1413 11/07/21 1730 11/08/21 0339  TROPONINIHS 30* 589* 2,957* 3,932*      Chemistry Recent Labs  Lab 11/07/21 1628 11/08/21 0339 11/09/21 0335 11/09/21 0338 11/10/21 0155 11/11/21 0807  NA 132* 131*  --  134* 134* 132*  K 3.3* 3.0*  --  3.2* 3.6 4.3  CL 82* 85*  --  87* 90* 91*  CO2 34* 33*  --  36* 36* 31  GLUCOSE 111* 206*   < > 189* 218* 290*  BUN 98* 94*  --  67* 54* 47*  CREATININE 2.69* 2.45*  --  1.76* 1.69* 1.66*  CALCIUM 9.5 9.2  --  10.0 10.3 9.7  MG 2.5*  --   --  2.4  --   --   PROT  --  5.7*  --   --   --   --   ALBUMIN  --  2.7*  --   --   --   --   AST  --  28  --   --   --   --   ALT  --  24  --   --   --   --   ALKPHOS  --  61  --   --   --   --   BILITOT  --  0.6  --   --   --   --   GFRNONAA 23* 26*  --  39* 41* 42*  ANIONGAP 16* 13  --  11 8 10    < > = values in this interval not displayed.     Lipids No results for input(s): CHOL, TRIG, HDL, LABVLDL, LDLCALC, CHOLHDL in the last 168 hours.  Hematology Recent Labs  Lab 11/09/21 0338 11/10/21 0155 11/11/21 0807  WBC 10.4 9.9 9.6  RBC 4.54 4.29 4.04*  HGB 13.7 13.0 12.3*  HCT 41.3 39.5 38.2*  MCV 91.0 92.1 94.6  MCH 30.2 30.3 30.4  MCHC 33.2 32.9 32.2  RDW 13.6 13.7 13.9  PLT 289 256 218    Thyroid  Recent Labs  Lab 11/07/21 1628  TSH  2.937     BNP Recent Labs  Lab 11/07/21 1628  BNP 197.4*     DDimer No results for input(s): DDIMER in the last 168 hours.   Radiology    CARDIAC CATHETERIZATION  Result Date: 11/10/2021   Ost RCA to Mid RCA lesion is 100% stenosed.   Prox Cx lesion is 30% stenosed.   Ramus lesion is 40% stenosed.   Mid LAD lesion is 35% stenosed.   LV end diastolic pressure is normal.   The left ventricular ejection fraction is 50-55% by visual estimate. 1.  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 2.  Mild to moderate disease of the left system 3.  LVEDP of around 12 mmHg depending on R to R interval. 4.  Future cardiac catheterizations Geordan Xu require a femoral approach due to lack of radial pulses; copious scar tissue is present in the right groin. Recommendation: Optimal medical management.   DG Chest Port 1 View  Result Date: 11/10/2021 CLINICAL DATA:  Shortness of breath EXAM: PORTABLE CHEST 1 VIEW COMPARISON:  Previous studies including the examination of 11/07/2021 FINDINGS: Transverse diameter of heart is increased. Central pulmonary vessels are prominent without signs of alveolar pulmonary edema. There is interval worsening of infiltrate in the right lower lung fields. Small linear densities seen in the medial left lower lung fields. Right lateral CP angle is indistinct. There is no pneumothorax. IMPRESSION: There is interval worsening of infiltrate in the right lower lung fields suggesting worsening of atelectasis/pneumonia. Subsegmental atelectasis is seen in the medial left lower lung fields. Possible small right pleural effusion. There is no pneumothorax. Electronically Signed   By: Elmer Picker M.D.   On: 11/10/2021 10:55    Cardiac Studies   Echo 11/08/2021 1. Left ventricular ejection fraction, by estimation, is 55 to 60%. The  left ventricle has normal function.  The left ventricle has no regional  wall motion  abnormalities. There is moderate left ventricular hypertrophy.  Left ventricular diastolic  parameters are indeterminate.   2. Right ventricular systolic function is normal. The right ventricular  size is normal.   3. The mitral valve is normal in structure. No evidence of mitral valve  regurgitation. No evidence of mitral stenosis. Moderate mitral annular  calcification.   4. The aortic valve is tricuspid. Aortic valve regurgitation is not  visualized. Aortic valve sclerosis/calcification is present, without any  evidence of aortic stenosis.   5. The inferior vena cava is normal in size with greater than 50%  respiratory variability, suggesting right atrial pressure of 3 mmHg.   6. Technically difficult study with poor acoustic windows.   7. The patient was in atrial fibrillation.     Patient Profile     80 y.o. 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 Rogers Blocker.  Assessment & Plan    1.  Acute kidney injury with hyponatremia, hypokalemia, hyporchloremia Suspect due to volume depletion in the setting of Lasix and metolazone Wilder Glade has been held Creatinine has improved to 1.66.  2.  New onset atrial fibrillation: CHA2DS2-VASc of 8.  Heart rates in the low 100s.  We Quinlyn Tep stop heparin today and start Eliquis 5 mg twice daily.  He Dorwin Fitzhenry need follow-up in atrial fibrillation clinic at discharge to discuss resumption of sinus rhythm.  His fatigue is potentially due to his atrial fibrillation.  3.  Non-STEMI with known coronary artery disease: Has an occluded RCA.  No intervention was performed as this is likely chronic.  Continue Plavix 75 mg.  We Ho Parisi add Imdur 60 mg.  4.   Hyperlipidemia: Goal LDL less than 70.  Continue Crestor  5.  COPD: On 2 L of oxygen at home.  3 L in the hospital.  Continue plan per primary team.    For questions or updates, please contact [place] Please consult [email or web address] for contact info under        Signed, Luciel Brickman Meredith Leeds, MD  11/11/2021, 10:02 AM

## 2021-11-11 NOTE — Progress Notes (Signed)
   11/11/21 0744  Assess: MEWS Score  Temp 98.1 F (36.7 C)  BP 114/88  Pulse Rate 94  ECG Heart Rate (!) 111  Resp 18  SpO2 100 %  O2 Device Nasal Cannula  Assess: MEWS Score  MEWS Temp 0  MEWS Systolic 0  MEWS Pulse 2  MEWS RR 0  MEWS LOC 0  MEWS Score 2  MEWS Score Color Yellow  Assess: if the MEWS score is Yellow or Red  Were vital signs taken at a resting state? Yes  Focused Assessment No change from prior assessment  Early Detection of Sepsis Score *See Row Information* Low  MEWS guidelines implemented *See Row Information* Yes  Treat  MEWS Interventions Administered scheduled meds/treatments  Pain Scale 0-10  Pain Score 0  Take Vital Signs  Increase Vital Sign Frequency  Yellow: Q 2hr X 2 then Q 4hr X 2, if remains yellow, continue Q 4hrs  Escalate  MEWS: Escalate Yellow: discuss with charge nurse/RN and consider discussing with provider and RRT  Notify: Charge Nurse/RN  Name of Charge Nurse/RN Notified Stephanie, RN  Date Charge Nurse/RN Notified 11/11/21  Time Charge Nurse/RN Notified [phone]  Document  Patient Outcome Other (Comment) (pt stable)  Progress note created (see row info) Yes

## 2021-11-11 NOTE — Progress Notes (Addendum)
ANTICOAGULATION CONSULT NOTE - Follow Up Consult  Pharmacy Consult for IV Heparin Indication:  Atrial Fibrillation  Allergies  Allergen Reactions   Enalapril Maleate Cough    REACTION: cough   Lisinopril Cough   Shellfish-Derived Products Swelling    Said occurred twice; has eaten some since and had no reactions    Patient Measurements: Height: 5\' 3"  (160 cm) Weight: 92.5 kg (204 lb) IBW/kg (Calculated) : 56.9 Heparin Dosing Weight: 92.5 kg  Vital Signs: Temp: 98 F (36.7 C) (11/19 0837) Temp Source: Oral (11/19 0744) BP: 114/88 (11/19 0837) Pulse Rate: 87 (11/19 0837)  Labs: Recent Labs    11/09/21 0338 11/09/21 1422 11/09/21 2054 11/10/21 0155 11/11/21 0807  HGB 13.7  --   --  13.0 12.3*  HCT 41.3  --   --  39.5 38.2*  PLT 289  --   --  256 218  HEPARINUNFRC 0.82*   < > 0.50 0.46 0.11*  CREATININE 1.76*  --   --  1.69*  --    < > = values in this interval not displayed.    Estimated Creatinine Clearance: 35.6 mL/min (A) (by C-G formula based on SCr of 1.69 mg/dL (H)).   Assessment: 80 yr old man with new onset atrial fibrillation with RVR. CHADSVASC=8 for CHF, HTN, DM, age, prior stroke, vascular disease.Pharmacy was consulted to dose IV heparin for a fib 8 hrs after sheath removal. Heparin gtt started 11/19 @0015 .  Heparin level subtherapeutic at 0.11 on 1000 units/hr. Patient is still in afib as of this morning. CBC stable, no s/sx of bleeding noted. Of note, patient had a therapeutic HL x4 on 1000-1100 units/hr. Will increase rate conservatively.   Goal of Therapy:  Heparin level 0.3-0.7 units/ml Monitor platelets by anticoagulation protocol: Yes   Plan:  Increase heparin gtt to 1250 units/hr  Repeat HL in 8h Monitor daily HL, CBC, and s/sx of bleeding daily F/U long-term anticoagulation plan for a fib  Joseph Art, Pharm.D. PGY-1 Pharmacy Resident [phone] 11/11/2021 9:21 AM

## 2021-11-11 NOTE — Progress Notes (Signed)
PROGRESS NOTE    [name].  KGY:[id number] DOB: 1941-11-03 DOA: 11/07/2021 PCP: Haydee Salter, MD    Brief Narrative:  [name] was admitted to the hospital with the working diagnosis of NEW onset atrial fibrillation complicated with AKI on CKD stage 3b, and elevated troponin.    80 year old male past medical history for coronary disease, COPD, chronic hypoxic respiratory failure, hypertension, chronic kidney disease, dyslipidemia, peripheral vascular disease, and type 2 diabetes mellitus who presented with chest pain.  He had acute onset of chest pain that woke him up from sleep.  8/10 intensity, sharp in nature, constant, associated with diaphoresis and dyspnea.  Because of severe symptoms he came to the hospital for further evaluation.   10/16/2021 diagnosed with COVID-19, received treatment with Paxlovid    Recent outpatient evaluation he was diagnosed with pneumonia, placed on levofloxacin and steroids.  The dose of his diuretic therapy was increased. On his initial physical examination his blood pressure was 142/73, heart rate 72, respirate 20, oxygen saturation 98%, he was afebrile.  His lungs are clear to auscultation bilaterally, no wheezing, rales or rhonchi, heart S1-S2, irregularly irregular, abdomen soft, positive trace lower extremity edema.   Sodium 132, potassium 3.3, chloride 82, bicarb 34, glucose 111, BUN 98, creatinine 2.69, BNP 197, high sensitive troponin [id number] SARS COVID-19 positive.   Chest radiograph with bibasilar atelectasis.   EKG 122 bpm, normal axis, normal intervals, atrial fibrillation rhythm, no significant ST segment or T wave changes.   Patient was placed on carvedilol for rate control and anticoagulation with heparin.  Renal function has been improving.  Patient is very weak and deconditioned, has difficulty moving, PT plan to re-consult PT for CIR or SNF evaluation.   Assessment & Plan:   Principal Problem:   Atrial fibrillation  (HCC) Active Problems:   Controlled diabetes mellitus type 2 with complications (HCC)   Dyslipidemia   Hypokalemia   Iron deficiency anemia   Essential hypertension   Coronary artery disease   Chronic diastolic heart failure (HCC)   Hemiplegia, 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HCC)   Elevated troponin   Chronic respiratory failure with hypoxia and hypercapnia (HCC)   COPD with chronic bronchitis (HCC)   History of stroke   Acute renal failure superimposed on stage 3b chronic kidney disease (HCC)   Increased anion gap metabolic acidosis   Malnutrition of moderate degree   New onset atrial fibrillation/ chronic diastolic heart failure/ NSTEMI demand ischemia.  Echocardiogram with preserved LV systolic function with EF 55 to 60%, no well motion abnormalities.    Plan to continue carvedilol 25 mg bid for rate control atrial fibrillation, transition to apixaban for anticoagulation.    For coronary artery disease will continue aggressive medical therapy with clopidogrel and statin therapy. On isosorbide.   Patient very weak and deconditioned, will re-consult PT to evaluate for possible CIR or SNF.    2. AKI on CKD stage 3b/ hypokalemia/ hyponatremia (base serum cr is 1.6 old records personally reviewed)  At home on diuretic therapy furosemide 120 mg in am and 80 mg pm, plus metolazone 2,5 mg per day.   Renal function today with serum cr at 1.66, K is 4,3 and serum bicarbonate at 31. Na is 132 and CL 91.   His renal function has been stabilized with serum cr now at baseline. Clinically patient with no over volume overload. Continue to hold on diuretic therapy. Consider resumption of diuresis with torsemide in the next 24 hrs, continue  close monitoring of renal function and electrolytes.   For metabolic bone disease, continue with calcitriol.     3. COPD, chronic hypoxemic respiratory failure (uses home 02 at night at home)  Pneumonia has been ruled out. Patient tested  positive for COVID 19, but had a positive test on 10/26, no signs of acute viral illness, no isolation.    No frank COPD exacerbation, Continue with antitussive agents, bronchodilator therapy and inhaled corticosteroids  Patient has home 02, continue oxymetry monitoring.      4. HTN  On isosorbide and carvedilol for blood pressure control.    5. T2DM/ dyslipidemia   Continue to have hyperglycemia, with fasting glucose at 290.  Will resume dapagliflozin and will add basal insulin 10 units (patient at home on dulaglutide)  Continue with insulin sliding scale for glucose cover and monitoring Continue with rosuvastatin,.     6. Hx of CVA/ depression  Continue with clopidogrel and rosuvastatin  Continue with citalopram.     7. Anemia of chronic disease with iron deficiency. Moderate calorie protein malnutrition  Iron supplementation  Nutritional supplementation     8. Gout.  No signs of acute exacerbation   r   Status is: Inpatient  Remains inpatient appropriate because: pending pt evaluation  DVT prophylaxis: Apixaban   Code Status:    full  Family Communication:   No family a the bedside      Nutrition Status: Nutrition Problem: Moderate Malnutrition Etiology: acute illness (COVID-19) Signs/Symptoms: mild fat depletion, mild muscle depletion Interventions: Ensure Enlive (each supplement provides 350kcal and 20 grams of protein), MVI    Consultants:  Cardiology   Procedures:  Cardiac cath      Subjective: Patient is feeling better but continue to be very weak and deconditioned, his dyspnea is stable and has no chest pain or palpitations.   Objective: Vitals:   11/11/21 0744 11/11/21 0749 11/11/21 0837 11/11/21 1110  BP: 114/88 114/88 114/88 106/75  Pulse: 94 94 87 (!) 109  Resp: 18 18 18 18   Temp: 98.1 F (36.7 C) 98 F (36.7 C) 98 F (36.7 C) 98.2 F (36.8 C)  TempSrc: Oral   Oral  SpO2: 100%   100%  Weight:      Height:        Intake/Output  Summary (Last 24 hours) at 11/11/2021 1145 Last data filed at 11/11/2021 0724 Gross per 24 hour  Intake --  Output 925 ml  Net -925 ml   Filed Weights   11/09/21 0427 11/10/21 0511 11/11/21 0437  Weight: 90.4 kg 89.7 kg 92.5 kg    Examination:   General: Not in pain or dyspnea Neurology: Awake and alert, non focal  E ENT: mild pallor, no icterus, oral mucosa moist Cardiovascular: No JVD. S1-S2 present, irregularly irregular Trace non pitting bilateral  lower extremity edema. Pulmonary: positive breath sounds bilaterally,with no wheezing, scattered rhonchi and rales bilaterally. Gastrointestinal. Abdomen soft and non tender Skin. No rashes Musculoskeletal: no joint deformities     Data Reviewed: I have personally reviewed following labs and imaging studies  CBC: Recent Labs  Lab 11/07/21 1223 11/08/21 0339 11/09/21 0338 11/10/21 0155 11/11/21 0807  WBC 11.0* 9.9 10.4 9.9 9.6  HGB 14.3 12.8* 13.7 13.0 12.3*  HCT 42.8 39.4 41.3 39.5 38.2*  MCV 90.7 91.4 91.0 92.1 94.6  PLT 412* 306 289 256 557   Basic Metabolic Panel: Recent Labs  Lab 11/07/21 1628 11/08/21 0339 11/09/21 0335 11/09/21 0338 11/10/21 0155 11/11/21 0807  NA  132* 131*  --  134* 134* 132*  K 3.3* 3.0*  --  3.2* 3.6 4.3  CL 82* 85*  --  87* 90* 91*  CO2 34* 33*  --  36* 36* 31  GLUCOSE 111* 206* 181* 189* 218* 290*  BUN 98* 94*  --  67* 54* 47*  CREATININE 2.69* 2.45*  --  1.76* 1.69* 1.66*  CALCIUM 9.5 9.2  --  10.0 10.3 9.7  MG 2.5*  --   --  2.4  --   --    GFR: Estimated Creatinine Clearance: 36.3 mL/min (A) (by C-G formula based on SCr of 1.66 mg/dL (H)). Liver Function Tests: Recent Labs  Lab 11/08/21 0339  AST 28  ALT 24  ALKPHOS 61  BILITOT 0.6  PROT 5.7*  ALBUMIN 2.7*   No results for input(s): LIPASE, AMYLASE in the last 168 hours. No results for input(s): AMMONIA in the last 168 hours. Coagulation Profile: Recent Labs  Lab 11/08/21 0207  INR 1.2   Cardiac  Enzymes: No results for input(s): CKTOTAL, CKMB, CKMBINDEX, TROPONINI in the last 168 hours. BNP (last 3 results) Recent Labs    11/03/21 1220  PROBNP 168.0*   HbA1C: No results for input(s): HGBA1C in the last 72 hours. CBG: Recent Labs  Lab 11/10/21 1551 11/10/21 1708 11/10/21 2120 11/11/21 0745 11/11/21 1108  GLUCAP 163* 177* 262* 286* 213*   Lipid Profile: No results for input(s): CHOL, HDL, LDLCALC, TRIG, CHOLHDL, LDLDIRECT in the last 72 hours. Thyroid Function Tests: No results for input(s): TSH, T4TOTAL, FREET4, T3FREE, THYROIDAB in the last 72 hours. Anemia Panel: No results for input(s): VITAMINB12, FOLATE, FERRITIN, TIBC, IRON, RETICCTPCT in the last 72 hours.    Radiology Studies: I have reviewed all of the imaging during this hospital visit personally     Scheduled Meds:  apixaban  5 mg Oral BID   B-complex with vitamin C  1 tablet Oral Daily   calcitRIOL  0.25 mcg Oral Daily   carvedilol  25 mg Oral BID WC   chlorpheniramine-HYDROcodone  5 mL Oral Q12H   cholecalciferol  1,000 Units Oral Daily   citalopram  10 mg Oral Daily   clopidogrel  75 mg Oral Daily   feeding supplement  237 mL Oral TID BM   ferrous sulfate  325 mg Oral Q breakfast   finasteride  5 mg Oral Daily   insulin aspart  0-15 Units Subcutaneous TID AC & HS   ipratropium-albuterol  3 mL Nebulization Q6H   isosorbide mononitrate  60 mg Oral Daily   mometasone-formoterol  2 puff Inhalation BID   multivitamin with minerals   Oral Daily   pantoprazole  40 mg Oral Daily   rosuvastatin  10 mg Oral Daily   sodium chloride flush  3 mL Intravenous Q12H   sodium chloride flush  3 mL Intravenous Q12H   traZODone  100 mg Oral QHS   Continuous Infusions:  sodium chloride       LOS: 3 days        [name] Gabor Gerome Apley, MD

## 2021-11-12 DIAGNOSIS — N179 Acute kidney failure, unspecified: Secondary | ICD-10-CM | POA: Diagnosis not present

## 2021-11-12 DIAGNOSIS — J9611 Chronic respiratory failure with hypoxia: Secondary | ICD-10-CM | POA: Diagnosis not present

## 2021-11-12 DIAGNOSIS — I5032 Chronic diastolic (congestive) heart failure: Secondary | ICD-10-CM | POA: Diagnosis not present

## 2021-11-12 DIAGNOSIS — I4819 Other persistent atrial fibrillation: Secondary | ICD-10-CM | POA: Diagnosis not present

## 2021-11-12 LAB — RESPIRATORY PANEL BY PCR

## 2021-11-12 LAB — BASIC METABOLIC PANEL
Anion gap: 9 (ref 5–15)
BUN: 51 mg/dL — ABNORMAL HIGH (ref 8–23)
CO2: 33 mmol/L — ABNORMAL HIGH (ref 22–32)
Calcium: 10 mg/dL (ref 8.9–10.3)
Chloride: 90 mmol/L — ABNORMAL LOW (ref 98–111)
Creatinine, Ser: 1.77 mg/dL — ABNORMAL HIGH (ref 0.61–1.24)
GFR, Estimated: 39 mL/min — ABNORMAL LOW (ref >60)
Glucose, Bld: 285 mg/dL — ABNORMAL HIGH (ref 70–99)
Potassium: 4.4 mmol/L (ref 3.5–5.1)
Sodium: 132 mmol/L — ABNORMAL LOW (ref 135–145)

## 2021-11-12 LAB — CULTURE, BLOOD (ROUTINE X 2)
Culture: NO GROWTH
Culture: NO GROWTH
Special Requests: ADEQUATE

## 2021-11-12 LAB — GLUCOSE, CAPILLARY
Glucose-Capillary: 221 mg/dL — ABNORMAL HIGH (ref 70–99)
Glucose-Capillary: 295 mg/dL — ABNORMAL HIGH (ref 70–99)
Glucose-Capillary: 289 mg/dL — ABNORMAL HIGH (ref 70–99)
Glucose-Capillary: 255 mg/dL — ABNORMAL HIGH (ref 70–99)

## 2021-11-12 LAB — PROCALCITONIN: Procalcitonin: 0.14 ng/mL

## 2021-11-12 LAB — BRAIN NATRIURETIC PEPTIDE: B Natriuretic Peptide: 1353.6 pg/mL — ABNORMAL HIGH (ref 0.0–100.0)

## 2021-11-12 MED ORDER — FUROSEMIDE 10 MG/ML IJ SOLN
80.0000 mg | Freq: Once | INTRAMUSCULAR | Status: AC
  Administered 2021-11-12: 80 mg via INTRAVENOUS
  Filled 2021-11-12: qty 8

## 2021-11-12 MED ORDER — GERHARDT'S BUTT CREAM
TOPICAL_CREAM | Freq: Every day | CUTANEOUS | Status: DC
  Administered 2021-11-13: 1 via TOPICAL
  Filled 2021-11-12: qty 1

## 2021-11-12 MED ORDER — TORSEMIDE 20 MG PO TABS
40.0000 mg | ORAL_TABLET | Freq: Every day | ORAL | Status: DC
  Administered 2021-11-12 – 2021-11-14 (×3): 40 mg via ORAL
  Filled 2021-11-12 (×3): qty 2

## 2021-11-12 MED ORDER — ISOSORBIDE MONONITRATE ER 60 MG PO TB24
90.0000 mg | ORAL_TABLET | Freq: Every day | ORAL | Status: DC
  Administered 2021-11-13 – 2021-11-14 (×2): 90 mg via ORAL
  Filled 2021-11-12 (×2): qty 1

## 2021-11-12 MED ORDER — IPRATROPIUM-ALBUTEROL 0.5-2.5 (3) MG/3ML IN SOLN: 3.0000 mL | Freq: Four times a day (QID) | RESPIRATORY_TRACT | Status: DC | PRN

## 2021-11-12 NOTE — Progress Notes (Signed)
Progress Note  Patient Name: [name]. Date of Encounter: 11/12/2021  [facility] Cardiologist: Lauree Chandler, MD   Subjective   Has continued weakness and fatigue.  Unfortunately he developed chest pain this morning when he was taking his pills.  He is unsure whether or not a pill got stuck in his throat.  The pain was in the center of his chest, nonradiating.  He was in bed at the time.  It took approximately 20 minutes for the pain to improve.  ECG performed showed no ST changes.  He feels well right now without pain.  Inpatient Medications    Scheduled Meds:  apixaban  5 mg Oral BID   B-complex with vitamin C  1 tablet Oral Daily   calcitRIOL  0.25 mcg Oral Daily   carvedilol  25 mg Oral BID WC   chlorpheniramine-HYDROcodone  5 mL Oral Q12H   cholecalciferol  1,000 Units Oral Daily   citalopram  10 mg Oral Daily   clopidogrel  75 mg Oral Daily   dapagliflozin propanediol  5 mg Oral Daily   feeding supplement  237 mL Oral TID BM   ferrous sulfate  325 mg Oral Q breakfast   finasteride  5 mg Oral Daily   insulin aspart  0-15 Units Subcutaneous TID AC & HS   insulin glargine-yfgn  10 Units Subcutaneous Daily   ipratropium-albuterol  3 mL Nebulization Q6H   isosorbide mononitrate  60 mg Oral Daily   mometasone-formoterol  2 puff Inhalation BID   multivitamin with minerals   Oral Daily   pantoprazole  40 mg Oral Daily   rosuvastatin  10 mg Oral Daily   traZODone  100 mg Oral QHS   Continuous Infusions:  sodium chloride     PRN Meds: sodium chloride, acetaminophen, albuterol, guaiFENesin-dextromethorphan, nitroGLYCERIN, phenol   Vital Signs    Vitals:   11/11/21 2030 11/12/21 0410 11/12/21 0435 11/12/21 0819  BP:   121/70   Pulse:   98   Resp:   20 20  Temp:   97.9 F (36.6 C) 98.1 F (36.7 C)  TempSrc:   Oral Oral  SpO2: 97% 91% 100%   Weight:   93.7 kg   Height:        Intake/Output Summary (Last 24 hours) at 11/12/2021 0901 Last data  filed at 11/12/2021 0436 Gross per 24 hour  Intake --  Output 920 ml  Net -920 ml    Last 3 Weights 11/12/2021 11/11/2021 11/10/2021  Weight (lbs) 206 lb 9.1 oz 204 lb 197 lb 12 oz  Weight (kg) 93.7 kg 92.534 kg 89.7 kg      Telemetry    Atrial fibrillation, rates low 100s-personally reviewed  ECG    Atrial fibrillation-personally reviewed  Physical Exam   GEN: Well nourished, well developed, in no acute distress  HEENT: normal  Neck: no JVD, carotid bruits, or masses Cardiac: irregular; no murmurs, rubs, or gallops,no edema  Respiratory:  clear to auscultation bilaterally, normal work of breathing GI: soft, nontender, nondistended, + BS MS: no deformity or atrophy  Skin: warm and dry Neuro:  Strength and sensation are intact Psych: euthymic mood, full affect   Labs    High Sensitivity Troponin:   Recent Labs  Lab 11/07/21 1223 11/07/21 1413 11/07/21 1730 11/08/21 0339  TROPONINIHS 30* 589* 2,957* 3,932*      Chemistry Recent Labs  Lab 11/07/21 1628 11/08/21 3154 11/09/21 0335 11/09/21 0086 11/10/21 0155 11/11/21 7619 11/12/21 5093  NA 132* 131*  --  134* 134* 132* 132*  K 3.3* 3.0*  --  3.2* 3.6 4.3 4.4  CL 82* 85*  --  87* 90* 91* 90*  CO2 34* 33*  --  36* 36* 31 33*  GLUCOSE 111* 206*   < > 189* 218* 290* 285*  BUN 98* 94*  --  67* 54* 47* 51*  CREATININE 2.69* 2.45*  --  1.76* 1.69* 1.66* 1.77*  CALCIUM 9.5 9.2  --  10.0 10.3 9.7 10.0  MG 2.5*  --   --  2.4  --   --   --   PROT  --  5.7*  --   --   --   --   --   ALBUMIN  --  2.7*  --   --   --   --   --   AST  --  28  --   --   --   --   --   ALT  --  24  --   --   --   --   --   ALKPHOS  --  61  --   --   --   --   --   BILITOT  --  0.6  --   --   --   --   --   GFRNONAA 23* 26*  --  39* 41* 42* 39*  ANIONGAP 16* 13  --  11 8 10 9    < > = values in this interval not displayed.     Lipids No results for input(s): CHOL, TRIG, HDL, LABVLDL, LDLCALC, CHOLHDL in the last 168 hours.   Hematology Recent Labs  Lab 11/09/21 0338 11/10/21 0155 11/11/21 0807  WBC 10.4 9.9 9.6  RBC 4.54 4.29 4.04*  HGB 13.7 13.0 12.3*  HCT 41.3 39.5 38.2*  MCV 91.0 92.1 94.6  MCH 30.2 30.3 30.4  MCHC 33.2 32.9 32.2  RDW 13.6 13.7 13.9  PLT 289 256 218    Thyroid  Recent Labs  Lab 11/07/21 1628  TSH 2.937     BNP Recent Labs  Lab 11/07/21 1628 11/12/21 0027  BNP 197.4* 1,353.6*     DDimer No results for input(s): DDIMER in the last 168 hours.   Radiology    CARDIAC CATHETERIZATION  Result Date: 11/10/2021   Ost RCA to Mid RCA lesion is 100% stenosed.   Prox Cx lesion is 30% stenosed.   Ramus lesion is 40% stenosed.   Mid LAD lesion is 35% stenosed.   LV end diastolic pressure is normal.   The left ventricular ejection fraction is 50-55% by visual estimate. 1.  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 2.  Mild to moderate disease of the left system 3.  LVEDP of around 12 mmHg depending on R to R interval. 4.  Future cardiac catheterizations Aubrie Lucien require a femoral approach due to lack of radial pulses; copious scar tissue is present in the right groin. Recommendation: Optimal medical management.   DG Chest Port 1 View  Result Date: 11/11/2021 CLINICAL DATA:  Atrial fibrillation, pneumonia EXAM: PORTABLE CHEST 1 VIEW COMPARISON:  11/10/2021 FINDINGS: The lungs appear mildly hyperinflated, in keeping with changes of underlying COPD. Right basilar consolidation is again seen in keeping with acute lobar pneumonia in the appropriate clinical setting. Diffuse interstitial infiltrate has developed, likely representing progressive airway inflammation or trace interstitial pulmonary edema. No pneumothorax. Small right pleural  effusion has developed. Cardiac size is mildly enlarged. No acute bone abnormality. IMPRESSION: Stable right basilar consolidation. Interval development of small right pleural effusion.  Diffuse interstitial pulmonary infiltrate may reflect developing trace interstitial pulmonary edema or progressive airway inflammation. Stable cardiomegaly COPD Electronically Signed   By: Fidela Salisbury M.D.   On: 11/11/2021 20:51   DG Chest Port 1 View  Result Date: 11/10/2021 CLINICAL DATA:  Shortness of breath EXAM: PORTABLE CHEST 1 VIEW COMPARISON:  Previous studies including the examination of 11/07/2021 FINDINGS: Transverse diameter of heart is increased. Central pulmonary vessels are prominent without signs of alveolar pulmonary edema. There is interval worsening of infiltrate in the right lower lung fields. Small linear densities seen in the medial left lower lung fields. Right lateral CP angle is indistinct. There is no pneumothorax. IMPRESSION: There is interval worsening of infiltrate in the right lower lung fields suggesting worsening of atelectasis/pneumonia. Subsegmental atelectasis is seen in the medial left lower lung fields. Possible small right pleural effusion. There is no pneumothorax. Electronically Signed   By: Elmer Picker M.D.   On: 11/10/2021 10:55    Cardiac Studies   Echo 11/08/2021 1. Left ventricular ejection fraction, by estimation, is 55 to 60%. The  left ventricle has normal function. The left ventricle has no regional  wall motion abnormalities. There is moderate left ventricular hypertrophy.  Left ventricular diastolic  parameters are indeterminate.   2. Right ventricular systolic function is normal. The right ventricular  size is normal.   3. The mitral valve is normal in structure. No evidence of mitral valve  regurgitation. No evidence of mitral stenosis. Moderate mitral annular  calcification.   4. The aortic valve is tricuspid. Aortic valve regurgitation is not  visualized. Aortic valve sclerosis/calcification is present, without any  evidence of aortic stenosis.   5. The inferior vena cava is normal in size with greater than 50%  respiratory  variability, suggesting right atrial pressure of 3 mmHg.   6. Technically difficult study with poor acoustic windows.   7. The patient was in atrial fibrillation.     Patient Profile     80 y.o. 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 Rogers Blocker.  Assessment & Plan    1.  Acute kidney injury: Suspect due to volume depletion in the setting of Lasix and metolazone.  Wilder Glade has been held.  Creatinine remains relatively stable at 1.77.  2.  New onset atrial fibrillation: CHA2DS2-VASc of 8.  Heart rates remain in the low 100s.  He was started on Eliquis yesterday.  He Jamelle Noy need follow-up in atrial fibrillation clinic to discuss resumption of sinus rhythm.  He does have episodes of fatigue that are likely due to his atrial fibrillation.  3.  Non-STEMI: Has a known occluded RCA but no intervention performed as this is thought to be chronic.  Continue Plavix 75 mg.  He is currently on Imdur, but has continued to have episodes of chest pain.  Unfortunately his blood pressure has been limiting some of our medication management.  We Branson Kranz try to increase Imdur to 90 mg.  Also on carvedilol 25 mg twice daily.  4.  Hyperlipidemia: Goal  LDL less than 70.  Continue Crestor.  5.  COPD: On oxygen at home.  Continue plan per primary team.  For questions or updates, please contact [place] Please consult [email or web address] for contact info under        Signed, Deric Bocock Meredith Leeds, MD  11/12/2021, 9:01 AM

## 2021-11-12 NOTE — Progress Notes (Signed)
Physical Therapy Treatment Patient Details Name: [name]. MRN: [id number] DOB: 1941-06-22 Today's Date: 11/12/2021   History of Present Illness 80 y.o. male presents to [facility] hospital on 11/07/2021 with chest pain. Pt admitted for NSTEMI workup, also found to be in Afib. S/p L heart cath 11/18. Pt with recent COVID and PNA diagnosis in October 2022. PMH includes coronary disease, COPD, chronic hypoxic respiratory failure, HTN, CKD, HLD, PVD, and DMII.    PT Comments    The pt presents this session with decreased ability to complete bed mobility, sit-stand transfers, and decreased endurance for gait compared to initial evaluation on 11/17. The pt required rest after each movement of his LE to complete bed mobility and transfer to sitting EOB. He was able to complete x4 sit-stand transfers during the session, but rises very slowly and requires heavy use of BUE to rise and minA at times to steady. He was limited to 2 x 5 ft distances of ambulation in the room at this time due to reports of general fatigue. He had no instance of chest pain this session, but HR ranged from 110-145bpm and SpO2 ranged from 90-99% on RA (pt on 2L O2 upon arrival). Continue to recommend skilled PT acutely and HHPT following d/c to regain pt strength, power, and endurance.     Recommendations for follow up therapy are one component of a multi-disciplinary discharge planning process, led by the attending physician.  Recommendations may be updated based on patient status, additional functional criteria and insurance authorization.  Follow Up Recommendations  Home health PT (followed by transition to cardiac rehab)     Assistance Recommended at Discharge Intermittent Supervision/Assistance  Equipment Recommendations  None recommended by PT    Recommendations for Other Services       Precautions / Restrictions Precautions Precautions: Fall Precaution Comments: monitor SpO2 Restrictions Weight Bearing  Restrictions: No     Mobility  Bed Mobility Overal bed mobility: Needs Assistance Bed Mobility: Supine to Sit     Supine to sit: Min assist;HOB elevated     General bed mobility comments: minA with HOB elevated and significant increased time. pt resting after each movement of LE    Transfers Overall transfer level: Needs assistance Equipment used: Rolling walker (2 wheels) Transfers: Sit to/from Stand;Bed to chair/wheelchair/BSC Sit to Stand: Min guard     Step pivot transfers: Min guard     General transfer comment: minG with VC for hand placement, pt slow to power up but able to complete without physical assist    Ambulation/Gait Ambulation/Gait assistance: Supervision Gait Distance (Feet): 5 Feet (+ 5) Assistive device: Rolling walker (2 wheels) Gait Pattern/deviations: Step-to pattern;Trunk flexed Gait velocity: reduced Gait velocity interpretation: <1.31 ft/sec, indicative of household ambulator   General Gait Details: minimal stride length, poor trunk extension. Pt HR increased 115-140bpm with activity, SpO2 stable on RA. pt reports fatigue prohibiting further ambulation      Balance Overall balance assessment: Needs assistance Sitting-balance support: No upper extremity supported;Feet supported Sitting balance-Leahy Scale: Fair Sitting balance - Comments: unable to reach outside BOS to LE   Standing balance support: Reliant on assistive device for balance Standing balance-Leahy Scale: Poor Standing balance comment: dependent on BUE support for standing                            Cognition Arousal/Alertness: Awake/alert Behavior During Therapy: WFL for tasks assessed/performed Overall Cognitive Status: Impaired/Different from baseline Area of Impairment:  Safety/judgement;Awareness;Problem solving                         Safety/Judgement: Decreased awareness of deficits Awareness: Intellectual Problem Solving: Decreased  initiation;Requires verbal cues General Comments: pt with poor awareness or concern over deficits, incontinent of bowel and unaware. needinc cues/encouragement to complete tasks as well as increased time        Exercises      General Comments General comments (skin integrity, edema, etc.): pt on 2L O2 upon arrival, able to maintain SpO2 >93% on RA at rest with good pleth. Left on 1L and RN alerted. significant redness on bottom      Pertinent Vitals/Pain Pain Assessment: Faces Faces Pain Scale: Hurts little more Pain Location: bilateral neck and arms/shoulders Pain Descriptors / Indicators: Discomfort;Sore Pain Intervention(s): Limited activity within patient's tolerance;Monitored during session;Other (comment) (manual therapy to neck)     PT Goals (current goals can now be found in the care plan section) Acute Rehab PT Goals Patient Stated Goal: to improve strength and restore independence PT Goal Formulation: With patient Time For Goal Achievement: 11/23/21 Potential to Achieve Goals: Good Progress towards PT goals: Progressing toward goals    Frequency    Min 3X/week      PT Plan Current plan remains appropriate       AM-PAC PT "6 Clicks" Mobility   Outcome Measure  Help needed turning from your back to your side while in a flat bed without using bedrails?: A Little Help needed moving from lying on your back to sitting on the side of a flat bed without using bedrails?: A Little Help needed moving to and from a bed to a chair (including a wheelchair)?: A Little Help needed standing up from a chair using your arms (e.g., wheelchair or bedside chair)?: A Little Help needed to walk in hospital room?: A Little Help needed climbing 3-5 steps with a railing? : A Lot 6 Click Score: 17    End of Session Equipment Utilized During Treatment: Gait belt;Oxygen Activity Tolerance: Patient limited by fatigue Patient left: in chair;with call bell/phone within reach Nurse  Communication: Mobility status PT Visit Diagnosis: Other abnormalities of gait and mobility (R26.89);Muscle weakness (generalized) (M62.81)     Time: 9323-5573 PT Time Calculation (min) (ACUTE ONLY): 42 min  Charges:  $Gait Training: 8-22 mins $Therapeutic Activity: 23-37 mins                     West Carbo, PT, DPT   Acute Rehabilitation Department Pager #: [phone]   Sandra Cockayne 11/12/2021, 11:48 AM

## 2021-11-12 NOTE — Progress Notes (Signed)
HOSPITAL MEDICINE OVERNIGHT EVENT NOTE    Notified by nursing throughout the evening that patient has been experiencing worsening dry nonproductive cough and sore throat.  Nursing reports no thrush or exudate on their evaluation.  Initially with the sore throat I thought this may be viral and ordered a viral panel which was negative.   Patient is not experiencing an increased oxygen requirement but has been experiencing increasing shortness of breath ,particularly with exertion.  I therefore obtained an CXR which revealed developing interstitial infiltrates concerning for pulmonary edema.  This is associated with a marked increase in BNP from 197 5 days ago to 1353 this AM.  Chart reviewed, patient has a known history of diastolic HF and his usual high dose diuretic regimen has been held due to an improving AKI.  Review of home med rec reveals patient takes between 80-120mg  of PO Furosemide per dose.  Will start with 80mg  of IV Lasix x 1, monitor for symptomatic improvement.    Vernelle Emerald  MD [facility]

## 2021-11-12 NOTE — Progress Notes (Signed)
PROGRESS NOTE    [name].  ZDG:[id number] DOB: 1941-01-04 DOA: 11/07/2021 PCP: Haydee Salter, MD    Brief Narrative:  Mr. Rahming was admitted to the hospital with the working diagnosis of NEW onset atrial fibrillation complicated with AKI on CKD stage 3b, and elevated troponin.    80 year old male past medical history for coronary disease, COPD, chronic hypoxic respiratory failure, hypertension, chronic kidney disease, dyslipidemia, peripheral vascular disease, and type 2 diabetes mellitus who presented with chest pain.  He had acute onset of chest pain that woke him up from sleep.  8/10 intensity, sharp in nature, constant, associated with diaphoresis and dyspnea.  Because of severe symptoms he came to the hospital for further evaluation.   10/16/2021 diagnosed with COVID-19, received treatment with Paxlovid    Recent outpatient evaluation he was diagnosed with pneumonia, placed on levofloxacin and steroids.  The dose of his diuretic therapy was increased. On his initial physical examination his blood pressure was 142/73, heart rate 72, respirate 20, oxygen saturation 98%, he was afebrile.  His lungs are clear to auscultation bilaterally, no wheezing, rales or rhonchi, heart S1-S2, irregularly irregular, abdomen soft, positive trace lower extremity edema.   Sodium 132, potassium 3.3, chloride 82, bicarb 34, glucose 111, BUN 98, creatinine 2.69, BNP 197, high sensitive troponin 2957-3902 SARS COVID-19 positive.   Chest radiograph with bibasilar atelectasis.   EKG 122 bpm, normal axis, normal intervals, atrial fibrillation rhythm, no significant ST segment or T wave changes.   Patient was placed on carvedilol for rate control and anticoagulation with heparin.  Renal function has been improving.   Patient is very weak and deconditioned, has difficulty moving, PT plan to re-consult PT for CIR or SNF evaluation.      Assessment & Plan:   Principal Problem:   Atrial  fibrillation (HCC) Active Problems:   Controlled diabetes mellitus type 2 with complications (HCC)   Dyslipidemia   Hypokalemia   Iron deficiency anemia   Essential hypertension   Coronary artery disease   Chronic diastolic heart failure (HCC)   Hemiplegia, 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HCC)   Elevated troponin   Chronic respiratory failure with hypoxia and hypercapnia (HCC)   COPD with chronic bronchitis (HCC)   History of stroke   Acute renal failure superimposed on stage 3b chronic kidney disease (HCC)   Increased anion gap metabolic acidosis   Malnutrition of moderate degree   New onset atrial fibrillation/ acute on chronic diastolic heart failure/ NSTEMI demand ischemia.  Echocardiogram with preserved LV systolic function with EF 55 to 60%, no well motion abnormalities.    Continue carvedilol 25 mg bid for rate control and anticoagulation with apixaban.  CAD medical therapy with isosorbide, clopidogrel and statin therapy.  Third shift patient with worsening dyspnea and increase oxygen requirements, chest film with bilateral interstitial infiltrates, received furosemide 80 mg IV wit good toleration.  Plan to resume diuresis in with torsemide 40 mg    Plan for home health services at discharge.    2. AKI on CKD stage 3b/ hypokalemia/ hyponatremia (base serum cr is 1.6 old records personally reviewed)  At home on diuretic therapy furosemide 120 mg in am and 80 mg pm, plus metolazone 2,5 mg per day.   Renal function with serum cr at 1,7 with Na at 132, K 4,4 and cl 90. BUN 51   Positive volume overload, had furosemide IV this am at 8:00 am Plan to resume diuresis with torsemide 40 mg daily and  follow response.   Metabolic bone disease, continue with calcitriol.     3. COPD, chronic hypoxemic respiratory failure (uses home 02 at night at home)  Pneumonia has been ruled out. Patient tested positive for COVID 19, but had a positive test on 10/26, no signs of acute  viral illness, no isolation.    No frank signs of COPD exacerbation, On antitussive agents, bronchodilator therapy and inhaled corticosteroids  Continue oxymetry monitoring and supplemental 02 to keep oxygen saturation 88% or greater.     4. HTN  Continue with isosorbide and carvedilol for blood pressure control.    5. T2DM/ dyslipidemia   Persistent hyperglycemia, fasting glucose is 285, will continue sliding scale insulun and basal insulin 10 units.  On rosuvastatin,.     6. Hx of CVA/ depression  On clopidogrel and rosuvastatin  On citalopram.     7. Anemia of chronic disease with iron deficiency. Moderate calorie protein malnutrition Continue with oral Iron supplementation  Nutritional supplementation     8. Gout.  No signs of acute exacerbation      Patient continue to be at high risk for worsening renal function and heart failure   Status is: Inpatient  Remains inpatient appropriate because: telemetry monitoring and renal function monitoring   DVT prophylaxis: Apixban   Code Status:    full Family Communication:   No family at the bedside      Nutrition Status: Nutrition Problem: Moderate Malnutrition Etiology: acute illness (COVID-19) Signs/Symptoms: mild fat depletion, mild muscle depletion Interventions: Ensure Enlive (each supplement provides 350kcal and 20 grams of protein), MVI     Consultants:  Cardiology   Subjective: Patient continue to be very weak and deconditioned, has difficulty getting out of the bed or walking to the restroom.   Objective: Vitals:   11/12/21 0942 11/12/21 0944 11/12/21 1151 11/12/21 1152  BP:   110/83 110/83  Pulse:   100 100  Resp:   18 18  Temp:   98.3 F (36.8 C) 98.3 F (36.8 C)  TempSrc:   Oral   SpO2: 98% 98% 99%   Weight:      Height:        Intake/Output Summary (Last 24 hours) at 11/12/2021 1255 Last data filed at 11/12/2021 1053 Gross per 24 hour  Intake --  Output 1270 ml  Net -1270 ml   Filed  Weights   11/10/21 0511 11/11/21 0437 11/12/21 0435  Weight: 89.7 kg 92.5 kg 93.7 kg    Examination:   General: Not in pain or dyspnea, deconditioned  Neurology: Awake and alert, non focal  E ENT: no pallor, no icterus, oral mucosa moist Cardiovascular: No JVD. S1-S2 present, rhythmic, no gallops, rubs, or murmurs. Trace non pitting bilateral lower extremity edema. Pulmonary: positive breath sounds bilaterally, with no wheezing, but scattered rhonchi or rales. Gastrointestinal. Abdomen soft and non tender Skin. No rashes Musculoskeletal: no joint deformities     Data Reviewed: I have personally reviewed following labs and imaging studies  CBC: Recent Labs  Lab 11/07/21 1223 11/08/21 0339 11/09/21 0338 11/10/21 0155 11/11/21 0807  WBC 11.0* 9.9 10.4 9.9 9.6  HGB 14.3 12.8* 13.7 13.0 12.3*  HCT 42.8 39.4 41.3 39.5 38.2*  MCV 90.7 91.4 91.0 92.1 94.6  PLT 412* 306 289 256 960   Basic Metabolic Panel: Recent Labs  Lab 11/07/21 1628 11/08/21 0339 11/09/21 0335 11/09/21 0338 11/10/21 0155 11/11/21 0807 11/12/21 0027  NA 132* 131*  --  134* 134* 132* 132*  K 3.3* 3.0*  --  3.2* 3.6 4.3 4.4  CL 82* 85*  --  87* 90* 91* 90*  CO2 34* 33*  --  36* 36* 31 33*  GLUCOSE 111* 206* 181* 189* 218* 290* 285*  BUN 98* 94*  --  67* 54* 47* 51*  CREATININE 2.69* 2.45*  --  1.76* 1.69* 1.66* 1.77*  CALCIUM 9.5 9.2  --  10.0 10.3 9.7 10.0  MG 2.5*  --   --  2.4  --   --   --    GFR: Estimated Creatinine Clearance: 34.3 mL/min (A) (by C-G formula based on SCr of 1.77 mg/dL (H)). Liver Function Tests: Recent Labs  Lab 11/08/21 0339  AST 28  ALT 24  ALKPHOS 61  BILITOT 0.6  PROT 5.7*  ALBUMIN 2.7*   No results for input(s): LIPASE, AMYLASE in the last 168 hours. No results for input(s): AMMONIA in the last 168 hours. Coagulation Profile: Recent Labs  Lab 11/08/21 0207  INR 1.2   Cardiac Enzymes: No results for input(s): CKTOTAL, CKMB, CKMBINDEX, TROPONINI in the  last 168 hours. BNP (last 3 results) Recent Labs    11/03/21 1220  PROBNP 168.0*   HbA1C: No results for input(s): HGBA1C in the last 72 hours. CBG: Recent Labs  Lab 11/11/21 1108 11/11/21 1508 11/11/21 2314 11/12/21 0736 11/12/21 1150  GLUCAP 213* 342* 279* 221* 295*   Lipid Profile: No results for input(s): CHOL, HDL, LDLCALC, TRIG, CHOLHDL, LDLDIRECT in the last 72 hours. Thyroid Function Tests: No results for input(s): TSH, T4TOTAL, FREET4, T3FREE, THYROIDAB in the last 72 hours. Anemia Panel: No results for input(s): VITAMINB12, FOLATE, FERRITIN, TIBC, IRON, RETICCTPCT in the last 72 hours.    Radiology Studies: I have reviewed all of the imaging during this hospital visit personally     Scheduled Meds:  apixaban  5 mg Oral BID   B-complex with vitamin C  1 tablet Oral Daily   calcitRIOL  0.25 mcg Oral Daily   carvedilol  25 mg Oral BID WC   chlorpheniramine-HYDROcodone  5 mL Oral Q12H   cholecalciferol  1,000 Units Oral Daily   citalopram  10 mg Oral Daily   clopidogrel  75 mg Oral Daily   feeding supplement  237 mL Oral TID BM   ferrous sulfate  325 mg Oral Q breakfast   finasteride  5 mg Oral Daily   insulin aspart  0-15 Units Subcutaneous TID AC & HS   insulin glargine-yfgn  10 Units Subcutaneous Daily   [START ON 11/13/2021] isosorbide mononitrate  90 mg Oral Daily   mometasone-formoterol  2 puff Inhalation BID   multivitamin with minerals   Oral Daily   pantoprazole  40 mg Oral Daily   rosuvastatin  10 mg Oral Daily   traZODone  100 mg Oral QHS   Continuous Infusions:  sodium chloride       LOS: 4 days        Deaunte Dente Gerome Apley, MD

## 2021-11-13 ENCOUNTER — Encounter (HOSPITAL_COMMUNITY): Payer: Self-pay | Admitting: Internal Medicine

## 2021-11-13 ENCOUNTER — Telehealth: Payer: Self-pay

## 2021-11-13 DIAGNOSIS — I5032 Chronic diastolic (congestive) heart failure: Secondary | ICD-10-CM | POA: Diagnosis not present

## 2021-11-13 DIAGNOSIS — N179 Acute kidney failure, unspecified: Secondary | ICD-10-CM | POA: Diagnosis not present

## 2021-11-13 DIAGNOSIS — I25118 Atherosclerotic heart disease of native coronary artery with other forms of angina pectoris: Secondary | ICD-10-CM

## 2021-11-13 DIAGNOSIS — I10 Essential (primary) hypertension: Secondary | ICD-10-CM

## 2021-11-13 DIAGNOSIS — I4819 Other persistent atrial fibrillation: Secondary | ICD-10-CM | POA: Diagnosis not present

## 2021-11-13 DIAGNOSIS — J9611 Chronic respiratory failure with hypoxia: Secondary | ICD-10-CM | POA: Diagnosis not present

## 2021-11-13 LAB — BASIC METABOLIC PANEL
Anion gap: 8 (ref 5–15)
BUN: 57 mg/dL — ABNORMAL HIGH (ref 8–23)
CO2: 36 mmol/L — ABNORMAL HIGH (ref 22–32)
Calcium: 9.9 mg/dL (ref 8.9–10.3)
Chloride: 90 mmol/L — ABNORMAL LOW (ref 98–111)
Creatinine, Ser: 1.87 mg/dL — ABNORMAL HIGH (ref 0.61–1.24)
GFR, Estimated: 36 mL/min — ABNORMAL LOW (ref >60)
Glucose, Bld: 284 mg/dL — ABNORMAL HIGH (ref 70–99)
Potassium: 4 mmol/L (ref 3.5–5.1)
Sodium: 134 mmol/L — ABNORMAL LOW (ref 135–145)

## 2021-11-13 LAB — GLUCOSE, CAPILLARY
Glucose-Capillary: 295 mg/dL — ABNORMAL HIGH (ref 70–99)
Glucose-Capillary: 308 mg/dL — ABNORMAL HIGH (ref 70–99)
Glucose-Capillary: 175 mg/dL — ABNORMAL HIGH (ref 70–99)
Glucose-Capillary: 201 mg/dL — ABNORMAL HIGH (ref 70–99)

## 2021-11-13 MED ORDER — INSULIN ASPART 100 UNIT/ML IJ SOLN
3.0000 [IU] | Freq: Three times a day (TID) | INTRAMUSCULAR | Status: DC
  Administered 2021-11-13 – 2021-11-14 (×3): 3 [IU] via SUBCUTANEOUS

## 2021-11-13 MED ORDER — INSULIN GLARGINE-YFGN 100 UNIT/ML ~~LOC~~ SOLN
15.0000 [IU] | Freq: Every day | SUBCUTANEOUS | Status: DC
  Administered 2021-11-14: 15 [IU] via SUBCUTANEOUS
  Filled 2021-11-13: qty 0.15

## 2021-11-13 MED ORDER — INSULIN GLARGINE-YFGN 100 UNIT/ML ~~LOC~~ SOLN
5.0000 [IU] | Freq: Once | SUBCUTANEOUS | Status: AC
  Administered 2021-11-13: 5 [IU] via SUBCUTANEOUS
  Filled 2021-11-13 (×2): qty 0.05

## 2021-11-13 MED FILL — Verapamil HCl IV Soln 2.5 MG/ML: INTRAVENOUS | Qty: 2 | Status: AC

## 2021-11-13 NOTE — Progress Notes (Signed)
Mobility Specialist Progress Note    11/13/21 1800  Mobility  Activity Ambulated in room  Level of Assistance Contact guard assist, steadying assist  Assistive Device Front wheel walker  Distance Ambulated (ft) 40 ft (30+10)  Mobility Out of bed for toileting;Out of bed to chair with meals  Mobility Response Tolerated fair  Mobility performed by Mobility specialist  Bed Position Chair  $Mobility charge 1 Mobility   Pt received and agreeable. Walked to BR and attempted BM. While washing hands at sink asked for a chair and took a seated rest break for ~7-9 minutes. Ambulated to chair to sit up for meal but pt stated he has not been hungry and does not have an appetite. Left with call bell in reach.   [facility] Mobility Specialist  M.S. Primary Phone: [phone] M.S. Secondary Phone: [phone]

## 2021-11-13 NOTE — Discharge Instructions (Signed)

## 2021-11-13 NOTE — Consult Note (Signed)
   [facility] CM Inpatient Consult   11/13/2021  [name]. Aug 10, 1941 [id number]  [facility] [ACO] Patient: Medicare  Primary Care Provider:  Haydee Salter, MD, [facility]   is an embedded provider with a Chronic Care Management team and program, and is listed for the transition of care follow up and appointments.  Patient was screened for Embedded practice service needs for chronic care management and has been active with the CCM pharmacist.  Plan: Will follow for any Embedded Care Management TOC  for post hospital needs.  Please contact for further questions,  Natividad Brood, RN BSN [facility] Liaison  [phone] business mobile phone Toll free office [phone]  Fax number: [phone] [name]

## 2021-11-13 NOTE — TOC Initial Note (Signed)
Transition of Care (TOC) - Initial/Assessment Note    Patient Details  Name: [name]. MRN: [id number] Date of Birth: [birth year]  Transition of Care [facility]) CM/SW Contact:    Bethena Roys, RN Phone Number: 11/13/2021, 2:37 PM  Clinical Narrative:  Case Manager spoke with patient regarding disposition needs. Prior to arrival patient was from home with spouse. Case Manager discussed home health services with the patient and he was previously active with [facility] and wants to use them again. Patient asked Case Manager to call his spouse and discuss plan of care with her as well. Patient is agreeable to home health physical therapy and aide-MD aware to place orders. Referral made to [facility] and start of care to begin within 24-48 hours post transition home. Spouse is agreeable to services. No durable medical equipment identified at this time. Case Manager will continue to follow for disposition needs.                 Expected Discharge Plan: [place] Barriers to Discharge: No Barriers Identified   Patient Goals and CMS Choice Patient states their goals for this hospitalization and ongoing recovery are:: to return home with home health   Choice offered to / list presented to :  (Patient asked for [facility]- has used before)  Expected Discharge Plan and Services Expected Discharge Plan: [place] In-house Referral: NA Discharge Planning Services: CM Consult Post Acute Care Choice: [place] arrangements for the past 2 months: Single Family Home                   DME Agency: NA       HH Arranged: PT, Nurse's Aide HH Agency: [place] Date [facility] Agency Contacted: 11/13/21 Time HH Agency Contacted: 91 Representative spoke with at [place]: Tommi Rumps  Prior Living Arrangements/Services Living arrangements for the past 2 months: [place] Lives with:: Self, Spouse Patient language and need for interpreter  reviewed:: Yes Do you feel safe going back to the place where you live?: Yes      Need for Family Participation in Patient Care: Yes (Comment) Care giver support system in place?: Yes (comment) Current home services: DME (Patient has a rolling walker at home and o2 via [employer]) Criminal Activity/Legal Involvement Pertinent to Current Situation/Hospitalization: No - Comment as needed  Activities of Daily Living      Permission Sought/Granted Permission sought to share information with : Case Manager, Family Supports, Customer service manager Permission granted to share information with : Yes, Verbal Permission Granted     Permission granted to share info w AGENCY: [facility]        Emotional Assessment Appearance:: Appears stated age Attitude/Demeanor/Rapport: Engaged Affect (typically observed): Appropriate Orientation: : Oriented to Situation, Oriented to  Time, Oriented to Place, Oriented to Self Alcohol / Substance Use: Not Applicable Psych Involvement: No (comment)  Admission diagnosis:  Atrial fibrillation ([place]) [I48.91] Hypokalemia [E87.6] Hyperglycemia [R73.9] AKI (acute kidney injury) ([place]) [N17.9] Chest pain, unspecified type [R07.9] Patient Active Problem List   Diagnosis Date Noted   Malnutrition of moderate degree 11/10/2021   Acute renal failure superimposed on stage 3b chronic kidney disease ([place]) 11/07/2021   Increased anion gap metabolic acidosis 66/44/0347   Atrial fibrillation ([place]) 11/07/2021   Chronic kidney disease, stage 3b ([place]) 09/20/2021   History of stroke 09/20/2021   Abnormal x-ray of lung 08/14/2017   Chronic respiratory failure with hypoxia and hypercapnia ([place])  09/04/2016   COPD with chronic bronchitis ([place]) 08/30/2014   Obesity (BMI 30-39.9) 08/30/2014   NSTEMI (non-ST elevated myocardial infarction) ([place]) 07/17/2013   Elevated troponin 07/16/2013   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   Encounter for long-term (current)  use of other medications 11/24/2012   History of laparoscopic adjustable gastric banding 03/20/2012   Depression 12/14/2011   Physical deconditioning 12/14/2011   GERD (gastroesophageal reflux disease) 10/31/2011   Iron deficiency anemia 05/01/2010   History of basal cell carcinoma of skin 10/31/2009   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 07/27/2009   Coronary artery disease 02/02/2009   Chronic diastolic heart failure ([place]) 02/02/2009   Hypokalemia 06/03/2008   Osteoporosis 04/16/2008   Essential hypertension 04/01/2008   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 [place] [address] Phone: [phone] Fax: [phone]   Readmission Risk Interventions No flowsheet data found.

## 2021-11-13 NOTE — Progress Notes (Addendum)
Progress Note  Patient Name: [name]. Date of Encounter: 11/13/2021  [facility] Cardiologist: Lauree Chandler, MD   Subjective   Chest pain after eating last night. No chest pain this am.   Inpatient Medications    Scheduled Meds:  apixaban  5 mg Oral BID   B-complex with vitamin C  1 tablet Oral Daily   calcitRIOL  0.25 mcg Oral Daily   carvedilol  25 mg Oral BID WC   chlorpheniramine-HYDROcodone  5 mL Oral Q12H   cholecalciferol  1,000 Units Oral Daily   citalopram  10 mg Oral Daily   clopidogrel  75 mg Oral Daily   feeding supplement  237 mL Oral TID BM   ferrous sulfate  325 mg Oral Q breakfast   finasteride  5 mg Oral Daily   Gerhardt's butt cream   Topical Daily   insulin aspart  0-15 Units Subcutaneous TID AC & HS   insulin glargine-yfgn  10 Units Subcutaneous Daily   isosorbide mononitrate  90 mg Oral Daily   mometasone-formoterol  2 puff Inhalation BID   multivitamin with minerals   Oral Daily   pantoprazole  40 mg Oral Daily   rosuvastatin  10 mg Oral Daily   torsemide  40 mg Oral Daily   traZODone  100 mg Oral QHS   Continuous Infusions:  sodium chloride     PRN Meds: sodium chloride, acetaminophen, albuterol, guaiFENesin-dextromethorphan, ipratropium-albuterol, nitroGLYCERIN, phenol   Vital Signs    Vitals:   11/13/21 0024 11/13/21 0427 11/13/21 0806 11/13/21 0811  BP: (!) 109/50 130/69 132/83   Pulse: 88 81 86 96  Resp: 14 14 18 18   Temp:  97.8 F (36.6 C) 98.1 F (36.7 C)   TempSrc:  Oral Oral   SpO2: 99% 99% 99% 99%  Weight:  93.4 kg    Height:        Intake/Output Summary (Last 24 hours) at 11/13/2021 0932 Last data filed at 11/13/2021 3557 Gross per 24 hour  Intake --  Output 1650 ml  Net -1650 ml   Last 3 Weights 11/13/2021 11/12/2021 11/11/2021  Weight (lbs) 205 lb 14.6 oz 206 lb 9.1 oz 204 lb  Weight (kg) 93.4 kg 93.7 kg 92.534 kg      Telemetry    Atrial fib, rates 90-110 bpm-personally reviewed  ECG     No AM EKG-personally reviewed  Physical Exam    General: Well developed, well nourished, NAD  HEENT: OP clear, mucus membranes moist  SKIN: warm, dry. No rashes. Neuro: No focal deficits  Musculoskeletal: Muscle strength 5/5 all ext  Psychiatric: Mood and affect normal  Neck: No JVD, no carotid bruits, no thyromegaly, no lymphadenopathy.  Lungs:Clear bilaterally, no wheezes, rhonci, crackles Cardiovascular: Irreg irreg. No murmurs, gallops or rubs. Abdomen:Soft. Bowel sounds present. Non-tender.  Extremities: No lower extremity edema.   Labs    High Sensitivity Troponin:   Recent Labs  Lab 11/07/21 1223 11/07/21 1413 11/07/21 1730 11/08/21 0339  TROPONINIHS 30* 589* 2,957* 3,932*     Chemistry Recent Labs  Lab 11/07/21 1628 11/08/21 0339 11/09/21 0335 11/09/21 0338 11/10/21 0155 11/11/21 0807 11/12/21 0027 11/13/21 0123  NA 132* 131*  --  134*   < > 132* 132* 134*  K 3.3* 3.0*  --  3.2*   < > 4.3 4.4 4.0  CL 82* 85*  --  87*   < > 91* 90* 90*  CO2 34* 33*  --  36*   < >  31 33* 36*  GLUCOSE 111* 206*   < > 189*   < > 290* 285* 284*  BUN 98* 94*  --  67*   < > 47* 51* 57*  CREATININE 2.69* 2.45*  --  1.76*   < > 1.66* 1.77* 1.87*  CALCIUM 9.5 9.2  --  10.0   < > 9.7 10.0 9.9  MG 2.5*  --   --  2.4  --   --   --   --   PROT  --  5.7*  --   --   --   --   --   --   ALBUMIN  --  2.7*  --   --   --   --   --   --   AST  --  28  --   --   --   --   --   --   ALT  --  24  --   --   --   --   --   --   ALKPHOS  --  61  --   --   --   --   --   --   BILITOT  --  0.6  --   --   --   --   --   --   GFRNONAA 23* 26*  --  39*   < > 42* 39* 36*  ANIONGAP 16* 13  --  11   < > 10 9 8    < > = values in this interval not displayed.    Lipids No results for input(s): CHOL, TRIG, HDL, LABVLDL, LDLCALC, CHOLHDL in the last 168 hours.  Hematology Recent Labs  Lab 11/09/21 0338 11/10/21 0155 11/11/21 0807  WBC 10.4 9.9 9.6  RBC 4.54 4.29 4.04*  HGB 13.7 13.0 12.3*   HCT 41.3 39.5 38.2*  MCV 91.0 92.1 94.6  MCH 30.2 30.3 30.4  MCHC 33.2 32.9 32.2  RDW 13.6 13.7 13.9  PLT 289 256 218   Thyroid  Recent Labs  Lab 11/07/21 1628  TSH 2.937    BNP Recent Labs  Lab 11/07/21 1628 11/12/21 0027  BNP 197.4* 1,353.6*    DDimer No results for input(s): DDIMER in the last 168 hours.   Radiology    DG Chest Port 1 View  Result Date: 11/11/2021 CLINICAL DATA:  Atrial fibrillation, pneumonia EXAM: PORTABLE CHEST 1 VIEW COMPARISON:  11/10/2021 FINDINGS: The lungs appear mildly hyperinflated, in keeping with changes of underlying COPD. Right basilar consolidation is again seen in keeping with acute lobar pneumonia in the appropriate clinical setting. Diffuse interstitial infiltrate has developed, likely representing progressive airway inflammation or trace interstitial pulmonary edema. No pneumothorax. Small right pleural effusion has developed. Cardiac size is mildly enlarged. No acute bone abnormality. IMPRESSION: Stable right basilar consolidation. Interval development of small right pleural effusion. Diffuse interstitial pulmonary infiltrate may reflect developing trace interstitial pulmonary edema or progressive airway inflammation. Stable cardiomegaly COPD Electronically Signed   By: Fidela Salisbury M.D.   On: 11/11/2021 20:51    Cardiac Studies   Echo 11/08/2021 1. Left ventricular ejection fraction, by estimation, is 55 to 60%. The  left ventricle has normal function. The left ventricle has no regional  wall motion abnormalities. There is moderate left ventricular hypertrophy.  Left ventricular diastolic  parameters are indeterminate.   2. Right ventricular systolic function is normal. The right ventricular  size is normal.   3. The mitral valve is  normal in structure. No evidence of mitral valve  regurgitation. No evidence of mitral stenosis. Moderate mitral annular  calcification.   4. The aortic valve is tricuspid. Aortic valve regurgitation  is not  visualized. Aortic valve sclerosis/calcification is present, without any  evidence of aortic stenosis.   5. The inferior vena cava is normal in size with greater than 50%  respiratory variability, suggesting right atrial pressure of 3 mmHg.   6. Technically difficult study with poor acoustic windows.   7. The patient was in atrial fibrillation.     Patient Profile     80 y.o. 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 Rogers Blocker.  Assessment & Plan    1.  Acute kidney injury: Suspect due to volume depletion in the setting of Lasix and metolazone.  Wilder Glade has been held.  Creatinine stable.   2.  New onset atrial fibrillation: CHA2DS2-VASc of 8.  Heart rates are controlled. He is now on Eliquis. He was seen yesterday by Dr. Lennie Odor. Plans for f/u in the atrial fib clinic after discharge to discuss cardioversion after one month of anti-coagulation.    3.  Non-STEMI/elevated troponin: Cardiac cath 11/10/21 with CTO of the RCA which was known. Echo with LVEF=55-60%. Elevated troponin likely due to demand ischemia with AF with RVR. Will continue Plavix, statin and beta blocker. Continue Imdur. No plans for attempt at PCI of the RCA.   4.  Hyperlipidemia: Goal LDL less than 70.  Continue Crestor.  5.  COPD: On oxygen at home.  Continue plan per primary team.  6. Acute on chronic diastolic CHF: Volume status ok today. Renal function stable. Resume home dose of oral Lasix tomorrow.   SNF placement likely indicated.   For questions or updates, please contact [place] Please consult [email or web address] for contact info under        Signed, Lauree Chandler, MD   11/13/2021, 9:32 AM

## 2021-11-13 NOTE — Progress Notes (Signed)
PROGRESS NOTE    [name].  JFH:[id number] DOB: [birth year] DOA: 11/07/2021 PCP: Haydee Salter, MD    Brief Narrative:  [name] with [name] working diagnosis of NEW onset atrial fibrillation complicated with AKI on CKD stage 3b, and elevated troponin.    80 year old male past medical history for coronary disease, COPD, chronic hypoxic respiratory failure, hypertension, chronic kidney disease, dyslipidemia, peripheral vascular disease, and type 2 diabetes mellitus who presented with chest pain.  He had acute onset of chest pain that woke him up from sleep.  8/10 intensity, sharp in nature, constant, associated with diaphoresis and dyspnea.  Because of severe symptoms he came to [name] for further evaluation.   10/16/2021 diagnosed with COVID-19, received treatment with Paxlovid    Recent outpatient evaluation he was diagnosed with pneumonia, placed on levofloxacin and steroids.  [name] dose of his diuretic therapy was increased. On his initial physical examination his blood pressure was 142/73, heart rate 72, respirate 20, oxygen saturation 98%, he was afebrile.  His lungs are clear to auscultation bilaterally, no wheezing, rales or rhonchi, heart S1-S2, irregularly irregular, abdomen soft, positive trace lower extremity edema.   Sodium 132, potassium 3.3, chloride 82, bicarb 34, glucose 111, BUN 98, creatinine 2.69, BNP 197, high sensitive troponin [id number] SARS COVID-19 positive.   Chest radiograph with bibasilar atelectasis.   EKG 122 bpm, normal axis, normal intervals, atrial fibrillation rhythm, no significant ST segment or T wave changes.   Patient was placed on carvedilol for rate control and anticoagulation with heparin.  Renal function has been improving.   Patient is very weak and deconditioned, has difficulty moving, PT plan to re-consult PT for CIR or SNF evaluation  PT has recommended home health services.    Assessment &  Plan:   Principal Problem:   Atrial fibrillation (HCC) Active Problems:   Controlled diabetes mellitus type 2 with complications (HCC)   Dyslipidemia   Hypokalemia   Iron deficiency anemia   Essential hypertension   Coronary artery disease   Chronic diastolic heart failure (HCC)   Hemiplegia, 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HCC)   Elevated troponin   Chronic respiratory failure with hypoxia and hypercapnia (HCC)   COPD with chronic bronchitis (HCC)   History of stroke   Acute renal failure superimposed on stage 3b chronic kidney disease (HCC)   Increased anion gap metabolic acidosis   Malnutrition of moderate degree     New onset atrial fibrillation/ acute on chronic diastolic heart failure/ NSTEMI demand ischemia.  Echocardiogram with preserved LV systolic function with EF 55 to 60%, no well motion abnormalities.    Rate control with carvedilol 25 mg bid  Anticoagulation with apixaban  CAD on isosorbide, clopidogrel and statin therapy, aggressive guideline medical therapy.   Patient very weak and deconditioned     2. AKI on CKD stage 3b/ hypokalemia/ hyponatremia (base serum cr is 1.6 old records personally reviewed)  At home on diuretic therapy furosemide 120 mg in am and 80 mg pm, plus metolazone 2,5 mg per day.    Patient has been on torsemide for diuresis, 40 mg daily, clinically he is euvolemic.  Documented urine output is 1150 ml. Follow up renal panel in am, ( at home patient also taking metolazone)   For metabolic bone disease, continue with calcitriol.     3. COPD, chronic hypoxemic respiratory failure (uses home 02 at night at home)  Pneumonia has been ruled out. Patient tested positive  for COVID 19, but had a positive test on 10/26, no signs of acute viral illness, no isolation.    No frank signs of COPD exacerbation, Continue with antitussive agents, bronchodilator therapy and inhaled corticosteroids  Supplemental 02 per [place].    4. HTN  Blood  pressure control with isosorbide and carvedilol.     5. T2DM/ dyslipidemia   Fasting glucose is 284, patient has been on 10 units of basal insulin and insulin sliding scale for glucose cover and monitoring (used 29 units yesterday) Will add pre-meal insulin ( 3 units) therapy and increase basal to 15 units.   Continue with rosuvastatin,.     6. Hx of CVA/ depression  Continue with clopidogrel and rosuvastatin  Continue with citalopram.     7. Anemia of chronic disease with iron deficiency. Moderate calorie protein malnutrition On Iron supplementation  Continue with nutritional supplementation     8. Gout.  No signs of acute exacerbation    Status is: Inpatient  Remains inpatient appropriate because: follow up on renal function   DVT prophylaxis:  Enoxaparin   Code Status:    full  Family Communication:   I spoke over [name] phone with [name] about patient's  condition, plan of care, prognosis and all questions were addressed.    Nutrition Status: Nutrition Problem: Moderate Malnutrition Etiology: acute illness (COVID-19) Signs/Symptoms: mild fat depletion, mild muscle depletion Interventions: Ensure Enlive (each supplement provides 350kcal and 20 grams of protein), MVI    Consultants:  Cardiology   Procedures:  Cardiac cath   Subjective: Patient continue to be very weak and deconditioned, no nausea or vomiting, continue to have persistent cough, no chest pain, persistent dyspnea.   Objective: Vitals:   11/13/21 0806 11/13/21 0811 11/13/21 0834 11/13/21 1100  BP: 132/83  (!) 116/43 (!) 116/43  Pulse: 86 96 67 67  Resp: 18 18 20 20   Temp: 98.1 F (36.7 C)  97.9 F (36.6 C) 97.9 F (36.6 C)  TempSrc: Oral   Oral  SpO2: 99% 99%  99%  Weight:      Height:        Intake/Output Summary (Last 24 hours) at 11/13/2021 1314 Last data filed at 11/13/2021 1137 Gross per 24 hour  Intake --  Output 1125 ml  Net -1125 ml   Filed Weights   11/11/21 0437  11/12/21 0435 11/13/21 0427  Weight: 92.5 kg 93.7 kg 93.4 kg    Examination:   General: Not in pain or dyspnea, deconditioned  Neurology: Awake and alert, non focal  E ENT: mild pallor, no icterus, oral mucosa moist Cardiovascular: No JVD. S1-S2 present, rhythmic, no gallops, rubs, or murmurs. ++ non pitting bilateral lower extremity edema. Pulmonary: vesicular breath sounds bilaterally, with no wheezing, scattered proximal airway  rhonchi butt no rales. Gastrointestinal. Abdomen soft and non tender Skin. No rashes Musculoskeletal: no joint deformities     Data Reviewed: I have personally reviewed following labs and imaging studies  CBC: Recent Labs  Lab 11/07/21 1223 11/08/21 0339 11/09/21 0338 11/10/21 0155 11/11/21 0807  WBC 11.0* 9.9 10.4 9.9 9.6  HGB 14.3 12.8* 13.7 13.0 12.3*  HCT 42.8 39.4 41.3 39.5 38.2*  MCV 90.7 91.4 91.0 92.1 94.6  PLT 412* 306 289 256 478   Basic Metabolic Panel: Recent Labs  Lab 11/07/21 1628 11/08/21 0339 11/09/21 0338 11/10/21 0155 11/11/21 0807 11/12/21 0027 11/13/21 0123  NA 132*   < > 134* 134* 132* 132* 134*  K 3.3*   < >  3.2* 3.6 4.3 4.4 4.0  CL 82*   < > 87* 90* 91* 90* 90*  CO2 34*   < > 36* 36* 31 33* 36*  GLUCOSE 111*   < > 189* 218* 290* 285* 284*  BUN 98*   < > 67* 54* 47* 51* 57*  CREATININE 2.69*   < > 1.76* 1.69* 1.66* 1.77* 1.87*  CALCIUM 9.5   < > 10.0 10.3 9.7 10.0 9.9  MG 2.5*  --  2.4  --   --   --   --    < > = values in this interval not displayed.   GFR: Estimated Creatinine Clearance: 32.4 mL/min (A) (by C-G formula based on SCr of 1.87 mg/dL (H)). Liver Function Tests: Recent Labs  Lab 11/08/21 0339  AST 28  ALT 24  ALKPHOS 61  BILITOT 0.6  PROT 5.7*  ALBUMIN 2.7*   No results for input(s): LIPASE, AMYLASE in [name] last 168 hours. No results for input(s): AMMONIA in [name] last 168 hours. Coagulation Profile: Recent Labs  Lab 11/08/21 0207  INR 1.2   Cardiac Enzymes: No results for  input(s): CKTOTAL, CKMB, CKMBINDEX, TROPONINI in [name] last 168 hours. BNP (last 3 results) Recent Labs    11/03/21 1220  PROBNP 168.0*   HbA1C: No results for input(s): HGBA1C in [name] last 72 hours. CBG: Recent Labs  Lab 11/12/21 1150 11/12/21 1705 11/12/21 2126 11/13/21 0836 11/13/21 1136  GLUCAP 295* 289* 255* 295* 308*   Lipid Profile: No results for input(s): CHOL, HDL, LDLCALC, TRIG, CHOLHDL, LDLDIRECT in [name] last 72 hours. Thyroid Function Tests: No results for input(s): TSH, T4TOTAL, FREET4, T3FREE, THYROIDAB in [name] last 72 hours. Anemia Panel: No results for input(s): VITAMINB12, FOLATE, FERRITIN, TIBC, IRON, RETICCTPCT in [name] last 72 hours.    Radiology Studies: I have reviewed all of [name] imaging during this hospital visit personally     Scheduled Meds:  apixaban  5 mg Oral BID   B-complex with vitamin C  1 tablet Oral Daily   calcitRIOL  0.25 mcg Oral Daily   carvedilol  25 mg Oral BID WC   chlorpheniramine-HYDROcodone  5 mL Oral Q12H   cholecalciferol  1,000 Units Oral Daily   citalopram  10 mg Oral Daily   clopidogrel  75 mg Oral Daily   feeding supplement  237 mL Oral TID BM   ferrous sulfate  325 mg Oral Q breakfast   finasteride  5 mg Oral Daily   Gerhardt's butt cream   Topical Daily   insulin aspart  0-15 Units Subcutaneous TID AC & HS   insulin glargine-yfgn  10 Units Subcutaneous Daily   isosorbide mononitrate  90 mg Oral Daily   mometasone-formoterol  2 puff Inhalation BID   multivitamin with minerals   Oral Daily   pantoprazole  40 mg Oral Daily   rosuvastatin  10 mg Oral Daily   torsemide  40 mg Oral Daily   traZODone  100 mg Oral QHS   Continuous Infusions:  sodium chloride       LOS: 5 days        Emmalyn Hinson Gerome Apley, MD

## 2021-11-13 NOTE — Care Management Important Message (Signed)
Important Message  Patient Details  Name: [name]. MRN: [id number] Date of Birth: 1941/06/17   Medicare Important Message Given:  Yes     Shelda Altes 11/13/2021, 10:04 AM

## 2021-11-13 NOTE — Progress Notes (Signed)
Inpatient Diabetes Program Recommendations  AACE/ADA: New Consensus Statement on Inpatient Glycemic Control (2015)  Target Ranges:  Prepandial:   less than 140 mg/dL      Peak postprandial:   less than 180 mg/dL (1-2 hours)      Critically ill patients:  140 - 180 mg/dL   Lab Results  Component Value Date   GLUCAP 308 (H) 11/13/2021   HGBA1C 6.6 (H) 09/20/2021    Review of Glycemic Control  Latest Reference Range & Units 11/12/21 07:36 11/12/21 11:50 11/12/21 17:05 11/12/21 21:26 11/13/21 08:36 11/13/21 11:36  Glucose-Capillary 70 - 99 mg/dL 221 (H) 295 (H) 289 (H) 255 (H) 295 (H) 308 (H)   Diabetes history: DM 2 Outpatient Diabetes medications: Farxiga 2.5 mg Daily, Trulicity 4.5 mg Weekly, Novolog 7-9 units tid before meals Current orders for Inpatient glycemic control:  Semglee 10 units Daily Novolog 0-15 units tid & hs  Ensure Enlive tid between meals contributing to postprandial hyperglycemia  Inpatient Diabetes Program Recommendations:    -  Increase Semglee to 15 units -  Add Novolog 4 units tid meal coverage if eating >50% of meals   Thanks,  Tama Headings RN, MSN, BC-ADM Inpatient Diabetes Coordinator Team Pager [phone] (8a-5p)

## 2021-11-13 NOTE — Progress Notes (Signed)
Chronic Care Management Pharmacy Assistant   Name: Mikael Skoda.  MRN: [id number] DOB: 11-30-1941  Reason for Encounter: COPD Disease State Call.   Recent office visits:  11/03/2021 Dr Gena Fray MD (PCP) start levofloxacin 500 mg daily,start metolazone 2.5 mg daily, start prednisone 40 mg daily  10/26/2021 Dr Gena Fray MD (PCP) start guaiFENesin-codeine 100-10 MG/5ML syrup three times daily 10/17/2021 Dr Gena Fray MD (PCP) Start Benzonatate 100 mg PRN, start nirmatrelvir/ritonavir EUA for 5 days   Recent consult visits:  No recent consult [facility] visits:  Medication Reconciliation was completed by comparing discharge summary, patient's EMR and Pharmacy list, and upon discussion with patient.  Admitted to the hospital on 11/07/2021 due to Chest Pain. Discharge date was 11/14/2021. Discharged from [place]?Medications Started at [facility] Discharge:?? -started Apixaban 5 mg 1 tablet 2 times daily, Torsemide 40 mg   Medication Changes at Hospital Discharge: -Changed None  Medications Discontinued at Hospital Discharge: -Stopped Aspirin, Furosemide, levofloxacin, metolazone, potassium,predinsone  Medications that remain the same after Hospital Discharge:??  -All other medications will remain the same.    Medications: Facility-Administered Encounter Medications as of 11/13/2021  Medication   0.9 %  sodium chloride infusion   acetaminophen (TYLENOL) tablet 650 mg   albuterol (VENTOLIN HFA) 108 (90 Base) MCG/ACT inhaler 2 puff   apixaban (ELIQUIS) tablet 5 mg   B-complex with vitamin C tablet 1 tablet   calcitRIOL (ROCALTROL) capsule 0.25 mcg   carvedilol (COREG) tablet 25 mg   chlorpheniramine-HYDROcodone (TUSSIONEX) 10-8 MG/5ML suspension 5 mL   cholecalciferol (VITAMIN D3) tablet 1,000 Units   citalopram (CELEXA) tablet 10 mg   clopidogrel (PLAVIX) tablet 75 mg   feeding supplement (ENSURE ENLIVE / ENSURE PLUS) liquid 237 mL   ferrous sulfate tablet 325 mg    finasteride (PROSCAR) tablet 5 mg   Gerhardt's butt cream   guaiFENesin-dextromethorphan (ROBITUSSIN DM) 100-10 MG/5ML syrup 5 mL   insulin aspart (novoLOG) injection 0-15 Units   insulin glargine-yfgn (SEMGLEE) injection 10 Units   ipratropium-albuterol (DUONEB) 0.5-2.5 (3) MG/3ML nebulizer solution 3 mL   isosorbide mononitrate (IMDUR) 24 hr tablet 90 mg   mometasone-formoterol (DULERA) 200-5 MCG/ACT inhaler 2 puff   multivitamin with minerals tablet   nitroGLYCERIN (NITROLINGUAL) 0.4 MG/SPRAY spray 1 spray   pantoprazole (PROTONIX) EC tablet 40 mg   phenol (CHLORASEPTIC) mouth spray 1 spray   rosuvastatin (CRESTOR) tablet 10 mg   torsemide (DEMADEX) tablet 40 mg   traZODone (DESYREL) tablet 100 mg   Outpatient Encounter Medications as of 11/13/2021  Medication Sig Note   albuterol (VENTOLIN HFA) 108 (90 Base) MCG/ACT inhaler Inhale 2 puffs into the lungs every 6 (six) hours as needed for wheezing or shortness of breath.    allopurinol (ZYLOPRIM) 300 MG tablet TAKE 1 TABLET EVERY DAY (Patient taking differently: Take 300 mg by mouth daily.)    AMBULATORY NON FORMULARY MEDICATION O2 @ 2LMP @ night    aspirin 81 MG tablet Take 81 mg by mouth daily.    Azelastine HCl 137 MCG/SPRAY SOLN INSTILL 2 SPRAYS INTO BOTH NOSTRILS TWICE DAILY AS NEEDED FOR RHINITIS. (Patient taking differently: Place 2 sprays into both nostrils 2 (two) times daily as needed (For rhinitis).)    B Complex Vitamins (VITAMIN B COMPLEX PO) Take 1 tablet by mouth daily.    benzonatate (TESSALON) 100 MG capsule Take 1 capsule (100 mg total) by mouth 2 (two) times daily as needed for cough.    calcitRIOL (ROCALTROL)  0.25 MCG capsule TAKE 1 CAPSULE EVERY DAY    carvedilol (COREG) 25 MG tablet TAKE 1 TABLET TWICE DAILY WITH MEALS (Patient taking differently: Take 25 mg by mouth 2 (two) times daily with a meal.)    cetirizine (ZYRTEC) 10 MG tablet Take 10 mg by mouth as needed for allergies.     cholecalciferol (VITAMIN D3)  25 MCG (1000 UNIT) tablet Take 1,000 Units by mouth daily.    citalopram (CELEXA) 10 MG tablet TAKE 1 TABLET EVERY DAY (Patient taking differently: Take 10 mg by mouth daily.)    clopidogrel (PLAVIX) 75 MG tablet TAKE 1 TABLET EVERY DAY (Patient taking differently: Take 75 mg by mouth daily.)    dapagliflozin propanediol (FARXIGA) 5 MG TABS tablet Take 2.5 mg by mouth daily.    diphenhydrAMINE (BENADRYL) 25 MG tablet Take 25 mg by mouth at bedtime.    Dulaglutide (TRULICITY) 4.5 YQ/6.5HQ SOPN Inject 4.5 mg as directed once a week. 11/07/2021: Take on Sundays   ferrous sulfate 324 MG TBEC Take 324 mg by mouth daily at 2 PM.    finasteride (PROSCAR) 5 MG tablet TAKE 1 TABLET EVERY DAY (Patient taking differently: Take 5 mg by mouth daily.)    Fluticasone-Salmeterol (ADVAIR) 250-50 MCG/DOSE AEPB Inhale 1 puff into the lungs 2 (two) times daily.    furosemide (LASIX) 40 MG tablet TAKE 3 TABLETS BY MOUTH EVERY MORNING AND 2 TABLETS EVERY AFTERNOON. (DOSE INCREASE) (Patient taking differently: Take 80-120 mg by mouth See admin instructions. Take 3 tablets by mouth every morning, then take two tablets by mouth every evening per spouse)    GuaiFENesin (MUCINEX MAXIMUM STRENGTH PO) Take 1 Dose by mouth daily.    guaiFENesin-codeine 100-10 MG/5ML syrup Take 5 mLs by mouth 3 (three) times daily as needed for cough. (Patient not taking: Reported on 11/07/2021)    insulin aspart (NOVOLOG) 100 UNIT/ML injection Inject 7-9 Units into the skin 3 (three) times daily before meals.    isosorbide mononitrate (IMDUR) 60 MG 24 hr tablet Take 1 tablet (60 mg total) by mouth daily.    ketoconazole (NIZORAL) 2 % cream Apply 1 application topically daily as needed for irritation.     metolazone (ZAROXOLYN) 2.5 MG tablet Take 1 tablet (2.5 mg total) by mouth daily.    Multiple Vitamins-Minerals (MULTIVITAMIN PO) Take 1 tablet by mouth daily.    nitroGLYCERIN (NITROLINGUAL) 0.4 MG/SPRAY spray Place 1 spray under the tongue as  directed. (Patient taking differently: Place 1 spray under the tongue every 5 (five) minutes x 3 doses as needed for chest pain.)    pantoprazole (PROTONIX) 40 MG tablet TAKE 1 TABLET EVERY DAY (Patient taking differently: Take 40 mg by mouth daily.)    polyethylene glycol powder (GLYCOLAX/MIRALAX) 17 GM/SCOOP powder Take 17 g by mouth daily as needed (constipation).    potassium chloride (KLOR-CON) 10 MEQ tablet TAKE 1 TABLET EVERY DAY (Patient taking differently: Take 10 mEq by mouth daily.)    potassium chloride (KLOR-CON) 10 MEQ tablet Take 10 mEq by mouth daily. (Patient not taking: Reported on 11/07/2021)    predniSONE (DELTASONE) 20 MG tablet Take 2 tablets (40 mg total) by mouth daily with breakfast. (Patient not taking: Reported on 11/07/2021)    Protein (UNJURY UNFLAVORED PO) Take 8-16 oz by mouth daily.    rosuvastatin (CRESTOR) 10 MG tablet TAKE 1 TABLET EVERY DAY (Patient taking differently: Take 10 mg by mouth daily.)    Tiotropium Bromide Monohydrate (SPIRIVA RESPIMAT) 2.5 MCG/ACT AERS Inhale 1  puff into the lungs daily.    traZODone (DESYREL) 100 MG tablet TAKE 1 TABLET AT BEDTIME (Patient taking differently: Take 100 mg by mouth at bedtime.)     Care Gaps: Hepatitis C Screening COVID-19 Vaccine (4- Booster for Lenord Fellers) Star Rating Drug: 09/25/2021 Rosuvastatin 10 mg last filled for 90-Day supply (No Pharmacy on noted) 38/17/7116 Trulicity 4.5 mg last filled for a 28- Day Supply  at [facility] ([facility] [place] 5 mg covered under patient assistance through 2022.   Any gaps in medications fill history Repaglinide 2 mg last filled 04/25/2021 for 90 day supply.  Current COPD regimen:  Ventolin HFA 2 puffs every 6 hours as needed  Spiriva Respimat 1 puff daily  Wixela 1 puff twice daily  -Current allergic rhinitis  treatment  Azelastine Nasal spray 2 spray twice daily as needed  Cetirizine 10 mg daily  Benadryl 25 mg nightly  Guaifenesin daily as needed -  Drainage  CAT Score 01/20/2021  Total CAT Score 15   Patient states he is currently at his PCP office now for a follow up,but  his breathing is doing much better.Patient states he is very grateful for everything the clinical pharmacist has done to help him.  Adherence Review: Does the patient have >5 day gap between last estimated fill date for maintenance inhaler medications? No   [place] Clinical Production designer, theatre/television/film [phone]

## 2021-11-14 ENCOUNTER — Other Ambulatory Visit (HOSPITAL_COMMUNITY): Payer: Self-pay

## 2021-11-14 ENCOUNTER — Telehealth: Payer: Self-pay

## 2021-11-14 DIAGNOSIS — J439 Emphysema, unspecified: Secondary | ICD-10-CM

## 2021-11-14 DIAGNOSIS — E1169 Type 2 diabetes mellitus with other specified complication: Secondary | ICD-10-CM

## 2021-11-14 DIAGNOSIS — E785 Hyperlipidemia, unspecified: Secondary | ICD-10-CM

## 2021-11-14 LAB — BASIC METABOLIC PANEL
Anion gap: 9 (ref 5–15)
BUN: 56 mg/dL — ABNORMAL HIGH (ref 8–23)
CO2: 35 mmol/L — ABNORMAL HIGH (ref 22–32)
Calcium: 10.2 mg/dL (ref 8.9–10.3)
Chloride: 90 mmol/L — ABNORMAL LOW (ref 98–111)
Creatinine, Ser: 1.69 mg/dL — ABNORMAL HIGH (ref 0.61–1.24)
GFR, Estimated: 41 mL/min — ABNORMAL LOW (ref >60)
Glucose, Bld: 148 mg/dL — ABNORMAL HIGH (ref 70–99)
Potassium: 3.8 mmol/L (ref 3.5–5.1)
Sodium: 134 mmol/L — ABNORMAL LOW (ref 135–145)

## 2021-11-14 LAB — GLUCOSE, CAPILLARY
Glucose-Capillary: 148 mg/dL — ABNORMAL HIGH (ref 70–99)
Glucose-Capillary: 303 mg/dL — ABNORMAL HIGH (ref 70–99)

## 2021-11-14 MED ORDER — DAPAGLIFLOZIN PROPANEDIOL 10 MG PO TABS
10.0000 mg | ORAL_TABLET | Freq: Every day | ORAL | 0 refills | Status: DC
  Filled 2021-11-14: qty 30, 30d supply, fill #0

## 2021-11-14 MED ORDER — TORSEMIDE 20 MG PO TABS
40.0000 mg | ORAL_TABLET | Freq: Every day | ORAL | 0 refills | Status: DC
  Filled 2021-11-14: qty 60, 30d supply, fill #0

## 2021-11-14 MED ORDER — ENSURE ENLIVE PO LIQD
237.0000 mL | Freq: Three times a day (TID) | ORAL | 0 refills | Status: AC
  Filled 2021-11-14: qty 237, 1d supply, fill #0

## 2021-11-14 MED ORDER — APIXABAN 5 MG PO TABS
5.0000 mg | ORAL_TABLET | Freq: Two times a day (BID) | ORAL | 0 refills | Status: DC
  Filled 2021-11-14: qty 60, 30d supply, fill #0

## 2021-11-14 MED ORDER — DAPAGLIFLOZIN PROPANEDIOL 10 MG PO TABS
10.0000 mg | ORAL_TABLET | Freq: Every day | ORAL | Status: DC
  Administered 2021-11-14: 10 mg via ORAL
  Filled 2021-11-14: qty 1

## 2021-11-14 MED ORDER — GUAIFENESIN-DM 100-10 MG/5ML PO SYRP
5.0000 mL | ORAL_SOLUTION | Freq: Four times a day (QID) | ORAL | 0 refills | Status: DC | PRN
  Filled 2021-11-14: qty 118, 6d supply, fill #0

## 2021-11-14 NOTE — Progress Notes (Signed)
Physical Therapy Treatment Patient Details Name: [name]. MRN: [id number] DOB: [birth year] Today's Date: 11/14/2021   History of Present Illness 80 y.o. male presents to [facility] hospital on 11/07/2021 with chest pain. Pt admitted for NSTEMI workup, also found to be in Afib. S/p L heart cath 11/18. Pt with recent COVID and PNA diagnosis in October 2022. PMH includes coronary disease, COPD, chronic hypoxic respiratory failure, HTN, CKD, HLD, PVD, and DMII.    PT Comments    Pt supine in bed on entry, relays that he will be discharged home today. PT/OT present to confirm that pt able to mobilize safely enough to go home, given his mobility has been very limited. Spent increased time on education on need for frequent mobilization to improve endurance, given pt's wife's responsibilities caring for their adult daughter. Pt verbalizes understanding and willing to push himself further than he has previously. Pt is min guard for bed mobility, transfers and ambulation with RW. Pt to discharge home with HHPT/OT discussed need for regular mobility over holiday weekend and not waiting until [facility] services start up. Pt verbalizes understanding.     Recommendations for follow up therapy are one component of a multi-disciplinary discharge planning process, led by the attending physician.  Recommendations may be updated based on patient status, additional functional criteria and insurance authorization.  Follow Up Recommendations  Home health PT (followed by transition to cardiac rehab)     Assistance Recommended at Discharge Intermittent Supervision/Assistance  Equipment Recommendations  None recommended by PT       Precautions / Restrictions Precautions Precautions: Fall Precaution Comments: monitor SpO2 Restrictions Weight Bearing Restrictions: No     Mobility  Bed Mobility Overal bed mobility: Needs Assistance Bed Mobility: Supine to Sit     Supine to sit: HOB elevated;Min guard      General bed mobility comments: min guard for safety, increased time and effort and use of bedrail to pull to EoB,    Transfers Overall transfer level: Needs assistance Equipment used: Rolling walker (2 wheels) Transfers: Sit to/from Stand;Bed to chair/wheelchair/BSC Sit to Stand: Min guard;Min assist           General transfer comment: min A for initial sit>stand to steady walker as pt pulled up against it, vc for not to pull up, with additonal sit>stand during ambulation, min guard when cued for hand placement for power up    Ambulation/Gait Ambulation/Gait assistance: Min guard Gait Distance (Feet): 20 Feet (x2) Assistive device: Rolling walker (2 wheels) Gait Pattern/deviations: Step-to pattern;Trunk flexed Gait velocity: reduced Gait velocity interpretation: <1.31 ft/sec, indicative of household ambulator   General Gait Details: min guard for safety, maximal encouragement to progress to ambulation outside of room, with extended rest break able to ambulate back into room, pt with flexed forward posture and shuffling gait,          Balance Overall balance assessment: Needs assistance Sitting-balance support: No upper extremity supported;Feet supported Sitting balance-Leahy Scale: Fair     Standing balance support: Reliant on assistive device for balance Standing balance-Leahy Scale: Poor Standing balance comment: dependent on BUE support for standing                            Cognition Arousal/Alertness: Awake/alert Behavior During Therapy: WFL for tasks assessed/performed Overall Cognitive Status: Impaired/Different from baseline Area of Impairment: Safety/judgement;Awareness;Problem solving  Safety/Judgement: Decreased awareness of deficits Awareness: Intellectual Problem Solving: Decreased initiation;Requires verbal cues General Comments: pt continues to have poor awareness and/or concern over his deficits,  discussed need for mobility to be able to help his wift care for him           General Comments General comments (skin integrity, edema, etc.): VSS on 2L O2 via [place], continues to have coughing spells however able to maintain SpO2 >90%O2,      Pertinent Vitals/Pain Pain Assessment: Faces Faces Pain Scale: Hurts little more Pain Location: bilateral neck and arms/shoulders Pain Descriptors / Indicators: Discomfort;Sore Pain Intervention(s): Limited activity within patient's tolerance;Monitored during session;Repositioned     PT Goals (current goals can now be found in the care plan section) Acute Rehab PT Goals Patient Stated Goal: to improve strength and restore independence PT Goal Formulation: With patient Time For Goal Achievement: 11/23/21 Potential to Achieve Goals: Good Progress towards PT goals: Progressing toward goals    Frequency    Min 3X/week      PT Plan Current plan remains appropriate    Co-evaluation PT/OT/SLP Co-Evaluation/Treatment: Yes Reason for Co-Treatment: Necessary to address cognition/behavior during functional activity;Other (comment) (in order to progress mobility distance and endurance) PT goals addressed during session: Mobility/safety with mobility        AM-PAC PT "6 Clicks" Mobility   Outcome Measure  Help needed turning from your back to your side while in a flat bed without using bedrails?: A Little Help needed moving from lying on your back to sitting on the side of a flat bed without using bedrails?: A Little Help needed moving to and from a bed to a chair (including a wheelchair)?: A Little Help needed standing up from a chair using your arms (e.g., wheelchair or bedside chair)?: A Little Help needed to walk in hospital room?: A Little Help needed climbing 3-5 steps with a railing? : A Lot 6 Click Score: 17    End of Session Equipment Utilized During Treatment: Gait belt;Oxygen Activity Tolerance: Patient limited by  fatigue Patient left: in chair;with call bell/phone within reach Nurse Communication: Mobility status PT Visit Diagnosis: Other abnormalities of gait and mobility (R26.89);Muscle weakness (generalized) (M62.81)     Time: 8921-1941 PT Time Calculation (min) (ACUTE ONLY): 34 min  Charges:  $Therapeutic Activity: 8-22 mins                     Lakelynn Severtson B. Migdalia Dk PT, DPT Acute Rehabilitation Services Pager [phone] Office [phone]    [place] 11/14/2021, 11:56 AM

## 2021-11-14 NOTE — TOC Benefit Eligibility Note (Signed)
Patient Teacher, English as a foreign language completed.    The patient is currently admitted and upon discharge could be taking Eliquis 78m.  The current 30 day co-pay is $288.53 due to deductible not being met.  The patient is insured through [employer]    KSharlette Dense CPhT Pharmacy Patient Advocate Specialist CErickPatient Advocate Team Direct Number: [phone] Fax: [phone]

## 2021-11-14 NOTE — Discharge Summary (Signed)
Physician Discharge Summary  [name]. GQQ:[id number] DOB: 09/05/1941 DOA: 11/07/2021  PCP: Haydee Salter, MD  Admit date: 11/07/2021 Discharge date: 11/14/2021  Admitted From: Home  Disposition:  Home   Recommendations for Outpatient Follow-up and new medication changes:  Follow up with Dr Gena Fray in 7 to 10 days. Patient has been placed on apixaban for anticoagulation  Holding aspirin to prevent bleeding Diuretic therapy with torsemide, hold on metolazone.   Home Health: yes   Equipment/Devices: home 02    Discharge Condition: stable  CODE STATUS: full  Diet recommendation:  heart healthy   Brief/Interim Summary: [name] was admitted to the hospital with the working diagnosis of NEW onset atrial fibrillation complicated with AKI on CKD stage 3b, and elevated troponin, demand ischemia (NSTEMI).    80 year old male past medical history for coronary disease, COPD, chronic hypoxic respiratory failure, hypertension, chronic kidney disease, dyslipidemia, peripheral vascular disease, and type 2 diabetes mellitus who presented with chest pain.  He had acute onset of chest pain that woke him up from sleep.  8/10 intensity, sharp in nature, constant, associated with diaphoresis and dyspnea.  Because of severe symptoms he came to the hospital for further evaluation.   10/16/2021 diagnosed with COVID-19, received treatment with Paxlovid    Recent outpatient evaluation he was diagnosed with pneumonia, placed on levofloxacin and steroids.  The dose of his diuretic therapy was increased. On his initial physical examination his blood pressure was 142/73, heart rate 72, respirate 20, oxygen saturation 98%, he was afebrile.  His lungs on auscultation had no wheezing,but scattered bilateral rales and rhonchi, heart S1-S2, irregularly irregular, abdomen soft, positive trace lower extremity edema.   Sodium 132, potassium 3.3, chloride 82, bicarb 34, glucose 111, BUN 98, creatinine 2.69, BNP  197, high sensitive troponin [id number] SARS COVID-19 positive.   Chest radiograph with bibasilar atelectasis.   EKG 122 bpm, normal axis, normal intervals, atrial fibrillation rhythm, no significant ST segment or T wave changes.   Patient was placed on carvedilol for rate control and anticoagulation with  IV heparin.  Received IV fluids with improvement in renal function.   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 Recommendations to continue medical therapy.    Patient is very weak and deconditioned, has difficulty moving, PT plan to re-consult PT for CIR or SNF evaluation   PT has recommended home health services.   Patient has been placed back on diuretic therapy with stable renal function, plan to follow as outpatient.     New onset atrial fibrillation, acute on chronic diastolic heart failure exacerbation, demand ischemia, non-STEMI. Patient was admitted to a cardiac ward, he was placed on intravenous heparin for anticoagulation and received carvedilol for rate control. Further work-up with echocardiography showed preserved LV systolic function of 55 to 60% with no wall motion normalities.  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  Recommendations to continue aggressive medical therapy. Patient has been placed on isosorbide, clopidogrel and rosuvastatin. Resume dapagliflozin  No further chest pain.  Patient has been placed back on diuretic therapy with torsemide. His volume status has improved.  2.  Acute kidney injury on chronic kidney disease stage IIIb, hypokalemia, hyponatremia.  Patient received intravenous fluids with isotonic fluid with improvement of kidney function. His kidney function returned to baseline, he was resumed on diuretic therapy with torsemide. Instructions to hold  metolazone, and follow-up kidney  function as an outpatient.  At discharge sodium 134, potassium 3.8, chloride 90, bicarb 35, glucose 148, BUN 56, creatinine 1.69.  For metabolic bone disease continue calcitriol.  3.  COPD, chronic hypoxemic respiratory failure.  Pneumonia was ruled out.  Patient had a recent COVID-19 infection, tested positive 10/26 (home test) and again tested positive on admission. No signs of active viral infection, no isolation required.  Patient was continued on bronchodilator therapy and inhaled corticosteroids. Antitussive agents and airway clearing techniques. No signs of acute COPD exacerbation.  4.  Hypertension.  Continue blood pressure control with isosorbide and carvedilol.  5.  Type 2 diabetes mellitus (uncontrolled), dyslipidemia.  Patient had persistent hyperglycemia, he received basal insulin along with sliding scale and pre-meal.  At his discharge his fasting glucose was 148. He will resume his diabetic regimen at discharge.  Close follow-up on capillary glucose.  Continue statin and resume dapagliflozin.   6.  History of CVA/depression. Continue statin and clopidogrel. Patient has been on citalopram. Physical therapy recommended home health services.  7.  Anemia of chronic disease, iron deficiency, moderate calorie protein malnutrition. Continue oral iron supplementation, patient on nutritional supplements.  His hemoglobin and hematocrit remained stable.   8.  Gout.  No acute exacerbation.   Discharge Diagnoses:  Principal Problem:   Atrial fibrillation ([place]) Active Problems:   Dyslipidemia   Hypokalemia   Iron deficiency anemia   Essential hypertension   Coronary artery disease   Chronic diastolic heart failure (HCC)   NSTEMI (non-ST elevated myocardial infarction) (HCC)   Chronic respiratory failure with hypoxia and hypercapnia (HCC)   COPD with chronic bronchitis (HCC)   Obesity (BMI 30-39.9)   History of stroke   Acute renal  failure superimposed on stage 3b chronic kidney disease (HCC)   Increased anion gap metabolic acidosis   Malnutrition of moderate degree   Type 2 diabetes mellitus with hyperlipidemia ([place])    Discharge Instructions   Allergies as of 11/14/2021       Reactions   Enalapril Maleate Cough   REACTION: cough   Lisinopril Cough   Shellfish-derived Products Swelling   Said occurred twice; has eaten some since and had no reactions        Medication List     STOP taking these medications    aspirin 81 MG tablet   furosemide 40 MG tablet Commonly known as: LASIX   guaiFENesin-codeine 100-10 MG/5ML syrup   levofloxacin 500 MG tablet Commonly known as: LEVAQUIN   metolazone 2.5 MG tablet Commonly known as: ZAROXOLYN   potassium chloride 10 MEQ tablet Commonly known as: KLOR-CON   potassium chloride 10 MEQ tablet Commonly known as: KLOR-CON   predniSONE 20 MG tablet Commonly known as: DELTASONE       TAKE these medications    albuterol 108 (90 Base) MCG/ACT inhaler Commonly known as: VENTOLIN HFA Inhale 2 puffs into the lungs every 6 (six) hours as needed for wheezing or shortness of breath.   allopurinol 300 MG tablet Commonly known as: ZYLOPRIM TAKE 1 TABLET EVERY DAY What changed:  how much to take how to take this when to take this additional instructions   AMBULATORY NON FORMULARY MEDICATION O2 @ 2LMP @ night   apixaban 5 MG Tabs tablet Commonly known as: ELIQUIS Take 1 tablet (5 mg total) by mouth 2 (two) times daily.   Azelastine HCl 137 MCG/SPRAY Soln INSTILL 2 SPRAYS INTO BOTH NOSTRILS TWICE DAILY AS NEEDED FOR RHINITIS. What changed: See the new instructions.  benzonatate 100 MG capsule Commonly known as: TESSALON Take 1 capsule (100 mg total) by mouth 2 (two) times daily as needed for cough.   calcitRIOL 0.25 MCG capsule Commonly known as: ROCALTROL TAKE 1 CAPSULE EVERY DAY   carvedilol 25 MG tablet Commonly known as: COREG TAKE 1  TABLET TWICE DAILY WITH MEALS   cetirizine 10 MG tablet Commonly known as: ZYRTEC Take 10 mg by mouth as needed for allergies.   cholecalciferol 25 MCG (1000 UNIT) tablet Commonly known as: VITAMIN D3 Take 1,000 Units by mouth daily.   citalopram 10 MG tablet Commonly known as: CELEXA TAKE 1 TABLET EVERY DAY What changed:  how much to take how to take this when to take this additional instructions   clopidogrel 75 MG tablet Commonly known as: PLAVIX TAKE 1 TABLET EVERY DAY   dapagliflozin propanediol 10 MG Tabs tablet Commonly known as: FARXIGA Take 1 tablet (10 mg total) by mouth daily. What changed:  medication strength how much to take   diphenhydrAMINE 25 MG tablet Commonly known as: BENADRYL Take 25 mg by mouth at bedtime.   feeding supplement Liqd Take 237 mLs by mouth 3 (three) times daily between meals.   ferrous sulfate 324 MG Tbec Take 324 mg by mouth daily at 2 PM.   finasteride 5 MG tablet Commonly known as: PROSCAR TAKE 1 TABLET EVERY DAY   Fluticasone-Salmeterol 250-50 MCG/DOSE Aepb Commonly known as: ADVAIR Inhale 1 puff into the lungs 2 (two) times daily.   guaiFENesin-dextromethorphan 100-10 MG/5ML syrup Commonly known as: ROBITUSSIN DM Take 5 mLs by mouth every 6 (six) hours as needed for cough.   insulin aspart 100 UNIT/ML injection Commonly known as: novoLOG Inject 7-9 Units into the skin 3 (three) times daily before meals.   isosorbide mononitrate 60 MG 24 hr tablet Commonly known as: IMDUR Take 1 tablet (60 mg total) by mouth daily.   ketoconazole 2 % cream Commonly known as: NIZORAL Apply 1 application topically daily as needed for irritation.   MUCINEX MAXIMUM STRENGTH PO Take 1 Dose by mouth daily.   MULTIVITAMIN PO Take 1 tablet by mouth daily.   nitroGLYCERIN 0.4 MG/SPRAY spray Commonly known as: Nitrolingual Place 1 spray under the tongue as directed. What changed:  when to take this reasons to take this    pantoprazole 40 MG tablet Commonly known as: PROTONIX TAKE 1 TABLET EVERY DAY   polyethylene glycol powder 17 GM/SCOOP powder Commonly known as: GLYCOLAX/MIRALAX Take 17 g by mouth daily as needed (constipation).   rosuvastatin 10 MG tablet Commonly known as: CRESTOR TAKE 1 TABLET EVERY DAY   Spiriva Respimat 2.5 MCG/ACT Aers Generic drug: Tiotropium Bromide Monohydrate Inhale 1 puff into the lungs daily.   Torsemide 40 MG Tabs Take 40 mg by mouth daily. Start taking on: November 15, 2021   traZODone 100 MG tablet Commonly known as: DESYREL TAKE 1 TABLET AT BEDTIME What changed:  how much to take how to take this when to take this additional instructions   Trulicity 4.5 KZ/9.9JT Sopn Generic drug: Dulaglutide Inject 4.5 mg as directed once a week.   UNJURY UNFLAVORED PO Take 8-16 oz by mouth daily.   VITAMIN B COMPLEX PO Take 1 tablet by mouth daily.        Follow-up Information     [facility] Follow up.   Specialty: Cardiology Why: office will call with date and time of appointment Contact information: [address] [phone] [place]  [place], [facility] Follow up.   Specialty: Home Health Services Why: Home Health Physical Therapy/Aide-Office to call with visit times. Contact information: [address] [place] [address] [phone]                Allergies  Allergen Reactions   Enalapril Maleate Cough    REACTION: cough   Lisinopril Cough   Shellfish-Derived Products Swelling    Said occurred twice; has eaten some since and had no reactions    Consultations: Cariology    Procedures/Studies: DG Chest 2 View  Result Date: 11/07/2021 CLINICAL DATA:  Chest pain EXAM: CHEST - 2 VIEW COMPARISON:  None. FINDINGS: Cardiac and mediastinal contours are unchanged. Prominence of the right cardiac contour is unchanged compared to  multiple priors and likely due to prominent pericardial fat. No large pleural effusion or pneumothorax. Mild bibasilar opacities, likely due to bibasilar atelectasis. IMPRESSION: Mild bibasilar opacities, likely due to atelectasis. Electronically Signed   By: Yetta Glassman M.D.   On: 11/07/2021 12:48   DG Chest 2 View  Result Date: 11/03/2021 CLINICAL DATA:  Acute productive cough and dyspnea. EXAM: CHEST - 2 VIEW COMPARISON:  X-ray chest 01/20/2021. FINDINGS: Stable cardiomediastinal contours. Aortic atherosclerosis. Mild streaky bibasilar opacities. Chronic small right pleural effusion. No pneumothorax. Degenerative changes of the thoracic spine. IMPRESSION: Mild streaky bibasilar opacities may reflect atelectasis or infiltrate. Electronically Signed   By: Davina Poke D.O.   On: 11/03/2021 16:49   CARDIAC CATHETERIZATION  Result Date: 11/10/2021   Ost RCA to Mid RCA lesion is 100% stenosed.   Prox Cx lesion is 30% stenosed.   Ramus lesion is 40% stenosed.   Mid LAD lesion is 35% stenosed.   LV end diastolic pressure is normal.   The left ventricular ejection fraction is 50-55% by visual estimate. 1.  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 2.  Mild to moderate disease of the left system 3.  LVEDP of around 12 mmHg depending on R to R interval. 4.  Future cardiac catheterizations will require a femoral approach due to lack of radial pulses; copious scar tissue is present in the right groin. Recommendation: Optimal medical management.   DG Chest Port 1 View  Result Date: 11/11/2021 CLINICAL DATA:  Atrial fibrillation, pneumonia EXAM: PORTABLE CHEST 1 VIEW COMPARISON:  11/10/2021 FINDINGS: The lungs appear mildly hyperinflated, in keeping with changes of underlying COPD. Right basilar consolidation is again seen in keeping with acute lobar pneumonia in the appropriate clinical setting. Diffuse interstitial infiltrate has  developed, likely representing progressive airway inflammation or trace interstitial pulmonary edema. No pneumothorax. Small right pleural effusion has developed. Cardiac size is mildly enlarged. No acute bone abnormality. IMPRESSION: Stable right basilar consolidation. Interval development of small right pleural effusion. Diffuse interstitial pulmonary infiltrate may reflect developing trace interstitial pulmonary edema or progressive airway inflammation. Stable cardiomegaly COPD Electronically Signed   By: Fidela Salisbury M.D.   On: 11/11/2021 20:51   DG Chest Port 1 View  Result Date: 11/10/2021 CLINICAL DATA:  Shortness of breath EXAM: PORTABLE CHEST 1 VIEW COMPARISON:  Previous studies including the examination of 11/07/2021 FINDINGS: Transverse diameter of heart is increased. Central pulmonary vessels are prominent without signs of alveolar pulmonary edema. There is interval worsening of infiltrate in the right lower lung fields. Small linear densities seen in the medial left lower lung fields. Right lateral CP  angle is indistinct. There is no pneumothorax. IMPRESSION: There is interval worsening of infiltrate in the right lower lung fields suggesting worsening of atelectasis/pneumonia. Subsegmental atelectasis is seen in the medial left lower lung fields. Possible small right pleural effusion. There is no pneumothorax. Electronically Signed   By: Elmer Picker M.D.   On: 11/10/2021 10:55   ECHOCARDIOGRAM COMPLETE  Result Date: 11/08/2021    ECHOCARDIOGRAM REPORT   Patient Name:   [name]. Date of Exam: 11/08/2021 Medical Rec #:  [id number]         Height:       63.0 in Accession #:    [id number]        Weight:       198.0 lb Date of Birth:  12/01/1941         BSA:          1.925 m Patient Age:    36 years          BP:           121/86 mmHg Patient Gender: M                 HR:           85 bpm. Exam Location:  Inpatient Procedure: 2D Echo, Cardiac Doppler and Color Doppler Indications:     I48.91* Unspeicified atrial fibrillation  History:        Patient has prior history of Echocardiogram examinations, most                 recent 02/04/2018. CHF, Previous Myocardial Infarction and CAD,                 Abnormal ECG, Stroke, Arrythmias:Atrial Fibrillation; Risk                 Factors:Former Smoker, Hypertension, Diabetes and Dyslipidemia.  Sonographer:    Roseanna Rainbow RDCS Referring Phys: [id number] [facility]  Sonographer Comments: Technically difficult study due to poor echo windows. IMPRESSIONS  1. Left ventricular ejection fraction, by estimation, is 55 to 60%. The left ventricle has normal function. The left ventricle has no regional wall motion abnormalities. There is moderate left ventricular hypertrophy. Left ventricular diastolic parameters are indeterminate.  2. Right ventricular systolic function is normal. The right ventricular size is normal.  3. The mitral valve is normal in structure. No evidence of mitral valve regurgitation. No evidence of mitral stenosis. Moderate mitral annular calcification.  4. The aortic valve is tricuspid. Aortic valve regurgitation is not visualized. Aortic valve sclerosis/calcification is present, without any evidence of aortic stenosis.  5. The inferior vena cava is normal in size with greater than 50% respiratory variability, suggesting right atrial pressure of 3 mmHg.  6. Technically difficult study with poor acoustic windows.  7. The patient was in atrial fibrillation. FINDINGS  Left Ventricle: Left ventricular ejection fraction, by estimation, is 55 to 60%. The left ventricle has normal function. The left ventricle has no regional wall motion abnormalities. The left ventricular internal cavity size was normal in size. There is  moderate left ventricular hypertrophy. Left ventricular diastolic parameters are indeterminate. Right Ventricle: The right ventricular size is normal. No increase in right ventricular wall thickness. Right ventricular systolic  function is normal. Left Atrium: Left atrial size was normal in size. Right Atrium: Right atrial size was normal in size. Pericardium: There is no evidence of pericardial effusion. Mitral Valve: The mitral valve is normal in structure. There is mild calcification of  the mitral valve leaflet(s). Moderate mitral annular calcification. No evidence of mitral valve regurgitation. No evidence of mitral valve stenosis. Tricuspid Valve: The tricuspid valve is normal in structure. Tricuspid valve regurgitation is not demonstrated. Aortic Valve: The aortic valve is tricuspid. Aortic valve regurgitation is not visualized. Aortic valve sclerosis/calcification is present, without any evidence of aortic stenosis. Pulmonic Valve: The pulmonic valve was normal in structure. Pulmonic valve regurgitation is not visualized. Aorta: The aortic root is normal in size and structure. Venous: The inferior vena cava is normal in size with greater than 50% respiratory variability, suggesting right atrial pressure of 3 mmHg. IAS/Shunts: No atrial level shunt detected by color flow Doppler.  LEFT VENTRICLE PLAX 2D LVIDd:         5.15 cm LVIDs:         3.80 cm LV PW:         1.35 cm LV IVS:        1.55 cm LVOT diam:     2.00 cm LV SV:         47 LV SV Index:   25 LVOT Area:     3.14 cm  LV Volumes (MOD) LV vol d, MOD A2C: 44.5 ml LV vol d, MOD A4C: 47.5 ml LV vol s, MOD A2C: 19.0 ml LV vol s, MOD A4C: 16.5 ml LV SV MOD A2C:     25.5 ml LV SV MOD A4C:     47.5 ml LV SV MOD BP:      28.1 ml RIGHT VENTRICLE            IVC RV S [name]:     4.80 cm/s  IVC diam: 1.90 cm TAPSE (M-mode): 1.0 cm LEFT ATRIUM             Index        RIGHT ATRIUM           Index LA diam:        4.10 cm 2.13 cm/m   RA Area:     16.00 cm LA Vol (A2C):   41.7 ml 21.66 ml/m  RA Volume:   37.20 ml  19.32 ml/m LA Vol (A4C):   39.7 ml 20.62 ml/m LA Biplane Vol: 40.5 ml 21.03 ml/m  AORTIC VALVE LVOT Vmax:   87.80 cm/s LVOT Vmean:  56.200 cm/s LVOT VTI:    0.151 m  AORTA Ao  Root diam: 3.60 cm Ao Asc diam:  3.40 cm MITRAL VALVE MV Area (PHT): 3.92 cm     SHUNTS MV Decel Time: 194 msec     Systemic VTI:  0.15 m MV E velocity: 117.67 cm/s  Systemic Diam: 2.00 cm Dalton McleanMD Electronically signed by Franki Monte Signature Date/Time: 11/08/2021/5:04:42 PM    Final      Procedures: cardiac cath   Subjective: Patient is feeling better, he is out of bed to chair, no nausea or vomiting, dyspnea has improved but continue to feel weak.   Discharge Exam: Vitals:   11/14/21 0413 11/14/21 0747  BP:    Pulse: 99   Resp: 18   Temp: (!) 97.5 F (36.4 C)   SpO2: 98% 98%   Vitals:   11/13/21 2049 11/14/21 0413 11/14/21 0500 11/14/21 0747  BP: 119/67     Pulse: 69 99    Resp: 20 18    Temp: 98.1 F (36.7 C) (!) 97.5 F (36.4 C)    TempSrc: Oral Oral    SpO2: 96% 98%  98%  Weight:  93.9 kg   Height:        General: Not in pain or dyspnea, Neurology: Awake and alert, non focal  E ENT: mild pallor, no icterus, oral mucosa moist Cardiovascular: No JVD. S1-S2 present, irregularly irregular with no gallops, rubs, or murmurs. +/++ non pitting edema lower extremity edema bilaterally. Pulmonary: positive breath sounds bilaterally, with no wheezing, or rhonchi, but scattered rales. Gastrointestinal. Abdomen soft and non tender Skin. No rashes Musculoskeletal: no joint deformities   The results of significant diagnostics from this hospitalization (including imaging, microbiology, ancillary and laboratory) are listed below for reference.     Microbiology: Recent Results (from the past 240 hour(s))  Culture, blood (routine x 2)     Status: None   Collection Time: 11/07/21  3:04 PM   Specimen: BLOOD  Result Value Ref Range Status   Specimen Description BLOOD SITE NOT SPECIFIED  Final   Special Requests   Final    BOTTLES DRAWN AEROBIC AND ANAEROBIC Blood Culture adequate volume   Culture   Final    NO GROWTH 5 DAYS Performed at [facility],  [address] [place], [place] [address]    Report Status 11/12/2021 FINAL  Final  Resp Panel by RT-PCR (Flu A&B, Covid)     Status: Abnormal   Collection Time: 11/07/21  3:04 PM   Specimen: Nasopharyngeal(NP) swabs in vial transport medium  Result Value Ref Range Status   SARS Coronavirus 2 by RT PCR POSITIVE (A) NEGATIVE Final    Comment: RESULT CALLED TO, READ BACK BY AND VERIFIED WITH: CAMERON COBB RN 11/07/21 @1848  BY JW (NOTE) SARS-CoV-2 target nucleic acids are DETECTED.  The SARS-CoV-2 RNA is generally detectable in upper respiratory specimens during the acute phase of infection. Positive results are indicative of the presence of the identified virus, but do not rule out bacterial infection or co-infection with other pathogens not detected by the test. Clinical correlation with patient history and other diagnostic information is necessary to determine patient infection status. The expected result is Negative.  Fact Sheet for Patients: EntrepreneurPulse.com.au  Fact Sheet for Healthcare Providers: IncredibleEmployment.be  This test is not yet approved or cleared by the [place] FDA and  has been authorized for detection and/or diagnosis of SARS-CoV-2 by FDA under an Emergency Use Authorization (EUA).  This EUA will remain in effect (meaning this test can  be used) for the duration of  the COVID-19 declaration under Section 564(b)(1) of the Act, 21 U.S.C. section 360bbb-3(b)(1), unless the authorization is terminated or revoked sooner.     Influenza A by PCR NEGATIVE NEGATIVE Final   Influenza B by PCR NEGATIVE NEGATIVE Final    Comment: (NOTE) The Xpert Xpress SARS-CoV-2/FLU/RSV plus assay is intended as an aid in the diagnosis of influenza from Nasopharyngeal swab specimens and should not be used as a sole basis for treatment. Nasal washings and aspirates are unacceptable for Xpert Xpress SARS-CoV-2/FLU/RSV testing.  Fact Sheet  for Patients: EntrepreneurPulse.com.au  Fact Sheet for Healthcare Providers: IncredibleEmployment.be  This test is not yet approved or cleared by the [place] FDA and has been authorized for detection and/or diagnosis of SARS-CoV-2 by FDA under an Emergency Use Authorization (EUA). This EUA will remain in effect (meaning this test can be used) for the duration of the COVID-19 declaration under Section 564(b)(1) of the Act, 21 U.S.C. section 360bbb-3(b)(1), unless the authorization is terminated or revoked.  Performed at [facility], [place] [address] [place], [place] [address]  Culture, blood (routine x 2)     Status: None   Collection Time: 11/07/21  3:20 PM   Specimen: BLOOD RIGHT HAND  Result Value Ref Range Status   Specimen Description BLOOD RIGHT HAND  Final   Special Requests   Final    BOTTLES DRAWN AEROBIC AND ANAEROBIC Blood Culture results may not be optimal due to an inadequate volume of blood received in culture bottles   Culture   Final    NO GROWTH 5 DAYS Performed at [facility], [place] [address] [place], [place] [address]    Report Status 11/12/2021 FINAL  Final  Respiratory (~20 pathogens) panel by PCR     Status: None   Collection Time: 11/11/21  8:49 PM   Specimen: Nasopharyngeal Swab; Respiratory  Result Value Ref Range Status   Adenovirus NOT DETECTED NOT DETECTED Final   Coronavirus 229E NOT DETECTED NOT DETECTED Final    Comment: (NOTE) The Coronavirus on the Respiratory Panel, DOES NOT test for the novel  Coronavirus (2019 nCoV)    Coronavirus HKU1 NOT DETECTED NOT DETECTED Final   Coronavirus NL63 NOT DETECTED NOT DETECTED Final   Coronavirus OC43 NOT DETECTED NOT DETECTED Final   Metapneumovirus NOT DETECTED NOT DETECTED Final   Rhinovirus / Enterovirus NOT DETECTED NOT DETECTED Final   Influenza A NOT DETECTED NOT DETECTED Final   Influenza B NOT DETECTED NOT DETECTED Final   Parainfluenza  Virus 1 NOT DETECTED NOT DETECTED Final   Parainfluenza Virus 2 NOT DETECTED NOT DETECTED Final   Parainfluenza Virus 3 NOT DETECTED NOT DETECTED Final   Parainfluenza Virus 4 NOT DETECTED NOT DETECTED Final   Respiratory Syncytial Virus NOT DETECTED NOT DETECTED Final   Bordetella pertussis NOT DETECTED NOT DETECTED Final   Bordetella Parapertussis NOT DETECTED NOT DETECTED Final   Chlamydophila pneumoniae NOT DETECTED NOT DETECTED Final   Mycoplasma pneumoniae NOT DETECTED NOT DETECTED Final    Comment: Performed at [facility], [place] [address], [place], [place] [address]     Labs: BNP (last 3 results) Recent Labs    11/07/21 1628 11/12/21 0027  BNP 197.4* 9,509.3*   Basic Metabolic Panel: Recent Labs  Lab 11/07/21 1628 11/08/21 0339 11/09/21 0338 11/10/21 0155 11/11/21 0807 11/12/21 0027 11/13/21 0123 11/14/21 0217  NA 132*   < > 134* 134* 132* 132* 134* 134*  K 3.3*   < > 3.2* 3.6 4.3 4.4 4.0 3.8  CL 82*   < > 87* 90* 91* 90* 90* 90*  CO2 34*   < > 36* 36* 31 33* 36* 35*  GLUCOSE 111*   < > 189* 218* 290* 285* 284* 148*  BUN 98*   < > 67* 54* 47* 51* 57* 56*  CREATININE 2.69*   < > 1.76* 1.69* 1.66* 1.77* 1.87* 1.69*  CALCIUM 9.5   < > 10.0 10.3 9.7 10.0 9.9 10.2  MG 2.5*  --  2.4  --   --   --   --   --    < > = values in this interval not displayed.   Liver Function Tests: Recent Labs  Lab 11/08/21 0339  AST 28  ALT 24  ALKPHOS 61  BILITOT 0.6  PROT 5.7*  ALBUMIN 2.7*   No results for input(s): LIPASE, AMYLASE in the last 168 hours. No results for input(s): AMMONIA in the last 168 hours. CBC: Recent Labs  Lab 11/07/21 1223 11/08/21 0339 11/09/21 0338 11/10/21 0155 11/11/21 0807  WBC  11.0* 9.9 10.4 9.9 9.6  HGB 14.3 12.8* 13.7 13.0 12.3*  HCT 42.8 39.4 41.3 39.5 38.2*  MCV 90.7 91.4 91.0 92.1 94.6  PLT 412* 306 289 256 218   Cardiac Enzymes: No results for input(s): CKTOTAL, CKMB, CKMBINDEX, TROPONINI in the last 168  hours. BNP: Invalid input(s): POCBNP CBG: Recent Labs  Lab 11/13/21 0836 11/13/21 1136 11/13/21 1614 11/13/21 2136 11/14/21 0731  GLUCAP 295* 308* 175* 201* 148*   D-Dimer No results for input(s): DDIMER in the last 72 hours. Hgb A1c No results for input(s): HGBA1C in the last 72 hours. Lipid Profile No results for input(s): CHOL, HDL, LDLCALC, TRIG, CHOLHDL, LDLDIRECT in the last 72 hours. Thyroid function studies No results for input(s): TSH, T4TOTAL, T3FREE, THYROIDAB in the last 72 hours.  Invalid input(s): FREET3 Anemia work up No results for input(s): VITAMINB12, FOLATE, FERRITIN, TIBC, IRON, RETICCTPCT in the last 72 hours. Urinalysis    Component Value Date/Time   COLORURINE YELLOW 09/20/2021 1410   APPEARANCEUR CLEAR 09/20/2021 1410   LABSPEC 1.010 09/20/2021 1410   PHURINE 6.0 09/20/2021 1410   GLUCOSEU >=1000 (A) 09/20/2021 1410   HGBUR NEGATIVE 09/20/2021 1410   BILIRUBINUR NEGATIVE 09/20/2021 1410   KETONESUR NEGATIVE 09/20/2021 1410   PROTEINUR 100 (A) 07/16/2013 0935   UROBILINOGEN 0.2 09/20/2021 1410   NITRITE NEGATIVE 09/20/2021 1410   LEUKOCYTESUR NEGATIVE 09/20/2021 1410   Sepsis Labs Invalid input(s): PROCALCITONIN,  WBC,  LACTICIDVEN Microbiology Recent Results (from the past 240 hour(s))  Culture, blood (routine x 2)     Status: None   Collection Time: 11/07/21  3:04 PM   Specimen: BLOOD  Result Value Ref Range Status   Specimen Description BLOOD SITE NOT SPECIFIED  Final   Special Requests   Final    BOTTLES DRAWN AEROBIC AND ANAEROBIC Blood Culture adequate volume   Culture   Final    NO GROWTH 5 DAYS Performed at [facility], [address], [place], [place] [address]    Report Status 11/12/2021 FINAL  Final  Resp Panel by RT-PCR (Flu A&B, Covid)     Status: Abnormal   Collection Time: 11/07/21  3:04 PM   Specimen: Nasopharyngeal(NP) swabs in vial transport medium  Result Value Ref Range Status   SARS Coronavirus 2 by RT PCR  POSITIVE (A) NEGATIVE Final    Comment: RESULT CALLED TO, READ BACK BY AND VERIFIED WITH: CAMERON COBB RN 11/07/21 @1848  BY JW (NOTE) SARS-CoV-2 target nucleic acids are DETECTED.  The SARS-CoV-2 RNA is generally detectable in upper respiratory specimens during the acute phase of infection. Positive results are indicative of the presence of the identified virus, but do not rule out bacterial infection or co-infection with other pathogens not detected by the test. Clinical correlation with patient history and other diagnostic information is necessary to determine patient infection status. The expected result is Negative.  Fact Sheet for Patients: EntrepreneurPulse.com.au  Fact Sheet for Healthcare Providers: IncredibleEmployment.be  This test is not yet approved or cleared by the [place] FDA and  has been authorized for detection and/or diagnosis of SARS-CoV-2 by FDA under an Emergency Use Authorization (EUA).  This EUA will remain in effect (meaning this test can  be used) for the duration of  the COVID-19 declaration under Section 564(b)(1) of the Act, 21 U.S.C. section 360bbb-3(b)(1), unless the authorization is terminated or revoked sooner.     Influenza A by PCR NEGATIVE NEGATIVE Final   Influenza B by PCR NEGATIVE NEGATIVE Final  Comment: (NOTE) The Xpert Xpress SARS-CoV-2/FLU/RSV plus assay is intended as an aid in the diagnosis of influenza from Nasopharyngeal swab specimens and should not be used as a sole basis for treatment. Nasal washings and aspirates are unacceptable for Xpert Xpress SARS-CoV-2/FLU/RSV testing.  Fact Sheet for Patients: EntrepreneurPulse.com.au  Fact Sheet for Healthcare Providers: IncredibleEmployment.be  This test is not yet approved or cleared by the [place] FDA and has been authorized for detection and/or diagnosis of SARS-CoV-2 by FDA under an Emergency  Use Authorization (EUA). This EUA will remain in effect (meaning this test can be used) for the duration of the COVID-19 declaration under Section 564(b)(1) of the Act, 21 U.S.C. section 360bbb-3(b)(1), unless the authorization is terminated or revoked.  Performed at [facility], [place] [address], [place], [place] [address]   Culture, blood (routine x 2)     Status: None   Collection Time: 11/07/21  3:20 PM   Specimen: BLOOD RIGHT HAND  Result Value Ref Range Status   Specimen Description BLOOD RIGHT HAND  Final   Special Requests   Final    BOTTLES DRAWN AEROBIC AND ANAEROBIC Blood Culture results may not be optimal due to an inadequate volume of blood received in culture bottles   Culture   Final    NO GROWTH 5 DAYS Performed at [facility], [place] [address], [place], [place] [address]    Report Status 11/12/2021 FINAL  Final  Respiratory (~20 pathogens) panel by PCR     Status: None   Collection Time: 11/11/21  8:49 PM   Specimen: Nasopharyngeal Swab; Respiratory  Result Value Ref Range Status   Adenovirus NOT DETECTED NOT DETECTED Final   Coronavirus 229E NOT DETECTED NOT DETECTED Final    Comment: (NOTE) The Coronavirus on the Respiratory Panel, DOES NOT test for the novel  Coronavirus (2019 nCoV)    Coronavirus HKU1 NOT DETECTED NOT DETECTED Final   Coronavirus NL63 NOT DETECTED NOT DETECTED Final   Coronavirus OC43 NOT DETECTED NOT DETECTED Final   Metapneumovirus NOT DETECTED NOT DETECTED Final   Rhinovirus / Enterovirus NOT DETECTED NOT DETECTED Final   Influenza A NOT DETECTED NOT DETECTED Final   Influenza B NOT DETECTED NOT DETECTED Final   Parainfluenza Virus 1 NOT DETECTED NOT DETECTED Final   Parainfluenza Virus 2 NOT DETECTED NOT DETECTED Final   Parainfluenza Virus 3 NOT DETECTED NOT DETECTED Final   Parainfluenza Virus 4 NOT DETECTED NOT DETECTED Final   Respiratory Syncytial Virus NOT DETECTED NOT DETECTED Final   Bordetella pertussis NOT  DETECTED NOT DETECTED Final   Bordetella Parapertussis NOT DETECTED NOT DETECTED Final   Chlamydophila pneumoniae NOT DETECTED NOT DETECTED Final   Mycoplasma pneumoniae NOT DETECTED NOT DETECTED Final    Comment: Performed at [facility], [place] [address], [place], [place] [address]     Time coordinating discharge: 45 minutes  SIGNED:   Tawni Millers, MD  [facility] 11/14/2021, 8:51 AM

## 2021-11-14 NOTE — Progress Notes (Signed)
Mobility Specialist Progress Note    11/14/21 1218  Mobility  Activity Ambulated in hall  Level of Assistance Contact guard assist, steadying assist  [place] wheel walker  Distance Ambulated (ft) 64 ft (32+32)  Mobility Ambulated with assistance in hallway  Mobility Response Tolerated fair  Mobility performed by Mobility specialist  Bed Position Chair  $Mobility charge 1 Mobility   Pt received in chair and agreeable. Ambulated on 2LO2. Took a long seated rest break. Returned to chair with call bell in reach and RN present.   [facility] Mobility Specialist  M.S. Primary Phone: [phone] M.S. Secondary Phone: [phone]

## 2021-11-14 NOTE — Care Management (Signed)
11-14-21 1227 Case Manager called for an E-tank to be delivered to the patients room prior to transition home. Family to transport home via private vehicle. No further needs identified at this time.

## 2021-11-14 NOTE — Progress Notes (Signed)
Occupational Therapy Treatment Patient Details Name: [name]. MRN: [id number] DOB: [birth year] Today's Date: 11/14/2021   History of present illness 81 y.o. male [facility] to [facility] hospital on 11/07/2021 with chest pain. Pt admitted for NSTEMI workup, also found to be in Afib. S/p L heart cath 11/18. Pt with recent COVID and PNA diagnosis in October 2022. PMH includes coronary disease, COPD, chronic hypoxic respiratory failure, HTN, CKD, HLD, PVD, and DMII.   OT comments  Pt progressing towards acute OT goals. Continues to present with decreased activity tolerance and generalized weakness. Pt able to walk a bit into hallway, lengthy seated rest break. Discussed some strategies/considerations for safe d/c home, pt declining ST SNF. Up in recliner at end of session.    Recommendations for follow up therapy are one component of a multi-disciplinary discharge planning process, led by the attending physician.  Recommendations may be updated based on patient status, additional functional criteria and insurance authorization.    Follow Up Recommendations  Other (comment) (pt declining HHOT)    Assistance Recommended at Discharge Intermittent Supervision/Assistance  Equipment Recommendations  None recommended by OT    Recommendations for Other Services      Precautions / Restrictions Precautions Precautions: Fall Precaution Comments: monitor SpO2 Restrictions Weight Bearing Restrictions: No       Mobility Bed Mobility Overal bed mobility: Needs Assistance Bed Mobility: Supine to Sit     Supine to sit: Min guard;HOB elevated     General bed mobility comments: extra time and effort.    Transfers Overall transfer level: Needs assistance Equipment used: Rolling walker (2 wheels) Transfers: Sit to/from Stand Sit to Stand: Min guard           General transfer comment: min guard assist for safety. to/from EOB and recliner heights. extra time and effort. cues for hand  placement with rw.     Balance Overall balance assessment: Needs assistance Sitting-balance support: No upper extremity supported;Feet supported Sitting balance-Leahy Scale: Fair     Standing balance support: Reliant on assistive device for balance Standing balance-Leahy Scale: Poor Standing balance comment: dependent on BUE support for standing                           ADL either performed or assessed with clinical judgement   ADL Overall ADL's : Needs assistance/impaired                         Toilet Transfer: Min guard;Ambulation;Rolling walker (2 wheels)   Toileting- Clothing Manipulation and Hygiene: Min guard;Sitting/lateral lean;Sit to/from stand       Functional mobility during ADLs: Min guard;Rolling walker (2 wheels) General ADL Comments: Limited by fatigue and decreased activity tolerance. Able to walk distance into the hall way a bit with min guard assist, lengthy seated rest break prior to walking same distance to return.    Extremity/Trunk Assessment Upper Extremity Assessment Upper Extremity Assessment: Generalized weakness   Lower Extremity Assessment Lower Extremity Assessment: Defer to PT evaluation        Vision       Perception     Praxis      Cognition Arousal/Alertness: Awake/alert Behavior During Therapy: WFL for tasks assessed/performed Overall Cognitive Status: Impaired/Different from baseline Area of Impairment: Safety/judgement;Awareness;Problem solving                         Safety/Judgement: Decreased awareness of deficits  Awareness: Emergent Problem Solving: Decreased initiation;Requires verbal cues General Comments: pt continues to have poor awareness and/or concern over his deficits, discussed need for mobility to be able to help his wift care for him          Exercises     Shoulder Instructions       General Comments 2L O2 via [place] throughout session. Intermittent coughing spells.  encouraged incentive spirometer use    Pertinent Vitals/ Pain       Pain Assessment: Faces Faces Pain Scale: Hurts little more Pain Location: bilateral neck Pain Descriptors / Indicators: Heaviness Pain Intervention(s): Limited activity within patient's tolerance;Monitored during session;Repositioned  Home Living                                          Prior Functioning/Environment              Frequency  Min 2X/week        Progress Toward Goals  OT Goals(current goals can now be found in the care plan section)  Progress towards OT goals: Progressing toward goals  Acute Rehab OT Goals OT Goal Formulation: With patient Time For Goal Achievement: 11/24/21 Potential to Achieve Goals: Good ADL Goals Pt Will Perform Grooming: with modified independence;standing Pt Will Perform Lower Body Bathing: with modified independence;sit to/from stand Pt Will Perform Lower Body Dressing: with modified independence;sit to/from stand Pt Will Transfer to Toilet: with modified independence;ambulating;regular height toilet Pt Will Perform Toileting - Clothing Manipulation and hygiene: with modified independence;sit to/from stand Additional ADL Goal #1: Pt will gather items necessary for ADL around his room modified independently.  Plan Discharge plan remains appropriate    Co-evaluation      Reason for Co-Treatment: Necessary to address cognition/behavior during functional activity;Other (comment) (in order to progress mobility distance and endurance) PT goals addressed during session: Mobility/safety with mobility        AM-PAC OT "6 Clicks" Daily Activity     Outcome Measure   Help from another person eating meals?: None Help from another person taking care of personal grooming?: A Little Help from another person toileting, which includes using toliet, bedpan, or urinal?: A Little Help from another person bathing (including washing, rinsing, drying)?: A  Little Help from another person to put on and taking off regular upper body clothing?: None Help from another person to put on and taking off regular lower body clothing?: A Little 6 Click Score: 20    End of Session Equipment Utilized During Treatment: Rolling walker (2 wheels);Oxygen;Gait belt  OT Visit Diagnosis: Unsteadiness on feet (R26.81);Other abnormalities of gait and mobility (R26.89);Muscle weakness (generalized) (M62.81);Other (comment)   Activity Tolerance Patient limited by fatigue   Patient Left in chair;with call bell/phone within reach;with chair alarm set   Nurse Communication Mobility status;Patient requests pain meds        Time: 0916-1002 OT Time Calculation (min): 46 min  Charges: OT General Charges $OT Visit: 1 Visit OT Treatments $Self Care/Home Management : 8-22 mins  Tyrone Schimke, OT Acute Rehabilitation Services Pager: [phone] Office: [phone]   Hortencia Pilar 11/14/2021, 11:58 AM

## 2021-11-14 NOTE — Progress Notes (Signed)
Received order due to demand NSTEMI dx. Pt has only been able to walk in room due to significant weakness. Will place CRPII referral for [facility] as pt might progress in the future.  [place], ACSM 11:48 AM 11/14/2021

## 2021-11-14 NOTE — Telephone Encounter (Signed)
Transition Care Management Unsuccessful Follow-up Telephone Call  Date of discharge and from where:  [place] 11/14/2021  Attempts:  1st Attempt  Reason for unsuccessful TCM follow-up call:  Unable to reach patient

## 2021-11-14 NOTE — Progress Notes (Addendum)
Progress Note  Patient Name: [name]. Date of Encounter: 11/14/2021  [facility] Cardiologist: Lauree Chandler, MD   Subjective   No complaints this morning. Worked with PT yesterday. Still with persistent cough  Inpatient Medications    Scheduled Meds:  apixaban  5 mg Oral BID   B-complex with vitamin C  1 tablet Oral Daily   calcitRIOL  0.25 mcg Oral Daily   carvedilol  25 mg Oral BID WC   chlorpheniramine-HYDROcodone  5 mL Oral Q12H   cholecalciferol  1,000 Units Oral Daily   citalopram  10 mg Oral Daily   clopidogrel  75 mg Oral Daily   feeding supplement  237 mL Oral TID BM   ferrous sulfate  325 mg Oral Q breakfast   finasteride  5 mg Oral Daily   Gerhardt's butt cream   Topical Daily   insulin aspart  0-15 Units Subcutaneous TID AC & HS   insulin aspart  3 Units Subcutaneous TID WC   insulin glargine-yfgn  15 Units Subcutaneous Daily   isosorbide mononitrate  90 mg Oral Daily   mometasone-formoterol  2 puff Inhalation BID   multivitamin with minerals   Oral Daily   pantoprazole  40 mg Oral Daily   rosuvastatin  10 mg Oral Daily   torsemide  40 mg Oral Daily   traZODone  100 mg Oral QHS   Continuous Infusions:  sodium chloride     PRN Meds: sodium chloride, acetaminophen, albuterol, guaiFENesin-dextromethorphan, ipratropium-albuterol, nitroGLYCERIN, phenol   Vital Signs    Vitals:   11/13/21 2049 11/14/21 0413 11/14/21 0500 11/14/21 0747  BP: 119/67     Pulse: 69 99    Resp: 20 18    Temp: 98.1 F (36.7 C) (!) 97.5 F (36.4 C)    TempSrc: Oral Oral    SpO2: 96% 98%  98%  Weight:   93.9 kg   Height:        Intake/Output Summary (Last 24 hours) at 11/14/2021 0956 Last data filed at 11/14/2021 3532 Gross per 24 hour  Intake --  Output 475 ml  Net -475 ml   Last 3 Weights 11/14/2021 11/13/2021 11/12/2021  Weight (lbs) 207 lb 0.2 oz 205 lb 14.6 oz 206 lb 9.1 oz  Weight (kg) 93.9 kg 93.4 kg 93.7 kg      Telemetry    Afib  90-110 - Personally Reviewed  ECG    No new tracing   Physical Exam   GEN: No acute distress.   Neck: No JVD Cardiac: RRR, no murmurs, rubs, or gallops.  Respiratory: Diffuse rhonchi throughout GI: Soft, nontender, non-distended  MS: No edema; No deformity. Neuro:  Nonfocal  Psych: Normal affect   Labs    High Sensitivity Troponin:   Recent Labs  Lab 11/07/21 1223 11/07/21 1413 11/07/21 1730 11/08/21 0339  TROPONINIHS 30* 589* 2,957* 3,932*     Chemistry Recent Labs  Lab 11/07/21 1628 11/08/21 0339 11/09/21 0335 11/09/21 0338 11/10/21 0155 11/12/21 0027 11/13/21 0123 11/14/21 0217  NA 132* 131*  --  134*   < > 132* 134* 134*  K 3.3* 3.0*  --  3.2*   < > 4.4 4.0 3.8  CL 82* 85*  --  87*   < > 90* 90* 90*  CO2 34* 33*  --  36*   < > 33* 36* 35*  GLUCOSE 111* 206*   < > 189*   < > 285* 284* 148*  BUN 98* 94*  --  67*   < > 51* 57* 56*  CREATININE 2.69* 2.45*  --  1.76*   < > 1.77* 1.87* 1.69*  CALCIUM 9.5 9.2  --  10.0   < > 10.0 9.9 10.2  MG 2.5*  --   --  2.4  --   --   --   --   PROT  --  5.7*  --   --   --   --   --   --   ALBUMIN  --  2.7*  --   --   --   --   --   --   AST  --  28  --   --   --   --   --   --   ALT  --  24  --   --   --   --   --   --   ALKPHOS  --  61  --   --   --   --   --   --   BILITOT  --  0.6  --   --   --   --   --   --   GFRNONAA 23* 26*  --  39*   < > 39* 36* 41*  ANIONGAP 16* 13  --  11   < > 9 8 9    < > = values in this interval not displayed.    Lipids No results for input(s): CHOL, TRIG, HDL, LABVLDL, LDLCALC, CHOLHDL in the last 168 hours.  Hematology Recent Labs  Lab 11/09/21 0338 11/10/21 0155 11/11/21 0807  WBC 10.4 9.9 9.6  RBC 4.54 4.29 4.04*  HGB 13.7 13.0 12.3*  HCT 41.3 39.5 38.2*  MCV 91.0 92.1 94.6  MCH 30.2 30.3 30.4  MCHC 33.2 32.9 32.2  RDW 13.6 13.7 13.9  PLT 289 256 218   Thyroid  Recent Labs  Lab 11/07/21 1628  TSH 2.937    BNP Recent Labs  Lab 11/07/21 1628 11/12/21 0027  BNP  197.4* 1,353.6*    DDimer No results for input(s): DDIMER in the last 168 hours.   Radiology    No results found.  Cardiac Studies   Echo 11/08/2021 1. Left ventricular ejection fraction, by estimation, is 55 to 60%. The  left ventricle has normal function. The left ventricle has no regional  wall motion abnormalities. There is moderate left ventricular hypertrophy.  Left ventricular diastolic  parameters are indeterminate.   2. Right ventricular systolic function is normal. The right ventricular  size is normal.   3. The mitral valve is normal in structure. No evidence of mitral valve  regurgitation. No evidence of mitral stenosis. Moderate mitral annular  calcification.   4. The aortic valve is tricuspid. Aortic valve regurgitation is not  visualized. Aortic valve sclerosis/calcification is present, without any  evidence of aortic stenosis.   5. The inferior vena cava is normal in size with greater than 50%  respiratory variability, suggesting right atrial pressure of 3 mmHg.   6. Technically difficult study with poor acoustic windows.   7. The patient was in atrial fibrillation.   Cath: 11/10/21   Ost RCA to Mid RCA lesion is 100% stenosed.   Prox Cx lesion is 30% stenosed.   Ramus lesion is 40% stenosed.   Mid LAD lesion is 35% stenosed.   LV end diastolic pressure is normal.   The left ventricular ejection fraction is 50-55% by visual estimate.   1.  Chronic  total occlusion of the previously stented right coronary artery collateralized by left circumflex system. 2.  Mild to moderate disease of the left system 3.  LVEDP of around 12 mmHg depending on R to R interval. 4.  Future cardiac catheterizations will require a femoral approach due to lack of radial pulses; copious scar tissue is present in the right groin.   Recommendation: Optimal medical management.  Diagnostic Dominance: Right   Patient Profile     80 y.o. 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 Rogers Blocker.  Assessment & Plan    New onset Afib: overall heart rates are controlled. Remains on coreg 25mg  BID. Has been transitioned from IV heparin to Eliquis 5mg  BID  -- chadsvasc of 8 -- seen by EP with recommendations for follow up in the afib clinic with discussion regarding cardioversion after one month of anti-coagulation (will ensure this is arranged)  NSTEMI: hsTn peaked at 3932. Underwent cardiac cath noted above with cardiac cath noted above with CTO of previously stented RCA which is collateralized by Lcx. Recommendations for medical management . Felt that elevated troponin related to demand ischemia from Afib RVR and CHF.  -- continue plavix, statin, Imdur and BB therapy -- plan to resume farxiga at discharge   AKI: Cr peaked at 2.69, now improved, now down to 1.69  HLD: LDL 67 -- on crestor 10mg  daily   COPD: on O2 at home.   HFpEF: has been diuresed with IV lasix. Net - 7.3L -- started on torsemide 40mg  daily, discussed with him about holding metolazone at home until seen back in follow up within one week -- plan to resume farxiga 10mg   at discharge   Will arrange outpatient follow up  For questions or updates, please contact [place] Please consult [email or web address] for contact info under        Signed, Reino Bellis, NP  11/14/2021, 9:56 AM    I have personally seen and examined this patient. I agree with the assessment and plan as outlined above.  Doing well this am. No new cardiac recommendations. Continue Eliquis. We will arrange f/u in the atrial fib clinic. Cardiology will sign off.   Lauree Chandler 11/14/2021 10:39 AM

## 2021-11-15 DIAGNOSIS — E1169 Type 2 diabetes mellitus with other specified complication: Secondary | ICD-10-CM | POA: Diagnosis not present

## 2021-11-15 DIAGNOSIS — E1151 Type 2 diabetes mellitus with diabetic peripheral angiopathy without gangrene: Secondary | ICD-10-CM | POA: Diagnosis not present

## 2021-11-15 DIAGNOSIS — I21A1 Myocardial infarction type 2: Secondary | ICD-10-CM | POA: Diagnosis not present

## 2021-11-15 DIAGNOSIS — E876 Hypokalemia: Secondary | ICD-10-CM | POA: Diagnosis not present

## 2021-11-15 DIAGNOSIS — I5033 Acute on chronic diastolic (congestive) heart failure: Secondary | ICD-10-CM | POA: Diagnosis not present

## 2021-11-15 DIAGNOSIS — M170 Bilateral primary osteoarthritis of knee: Secondary | ICD-10-CM | POA: Diagnosis not present

## 2021-11-15 DIAGNOSIS — M160 Bilateral primary osteoarthritis of hip: Secondary | ICD-10-CM | POA: Diagnosis not present

## 2021-11-15 DIAGNOSIS — E871 Hypo-osmolality and hyponatremia: Secondary | ICD-10-CM | POA: Diagnosis not present

## 2021-11-15 DIAGNOSIS — D509 Iron deficiency anemia, unspecified: Secondary | ICD-10-CM | POA: Diagnosis not present

## 2021-11-15 DIAGNOSIS — N2581 Secondary hyperparathyroidism of renal origin: Secondary | ICD-10-CM | POA: Diagnosis not present

## 2021-11-15 DIAGNOSIS — N179 Acute kidney failure, unspecified: Secondary | ICD-10-CM | POA: Diagnosis not present

## 2021-11-15 DIAGNOSIS — I2510 Atherosclerotic heart disease of native coronary artery without angina pectoris: Secondary | ICD-10-CM | POA: Diagnosis not present

## 2021-11-15 DIAGNOSIS — F32A Depression, unspecified: Secondary | ICD-10-CM | POA: Diagnosis not present

## 2021-11-15 DIAGNOSIS — E1122 Type 2 diabetes mellitus with diabetic chronic kidney disease: Secondary | ICD-10-CM | POA: Diagnosis not present

## 2021-11-15 DIAGNOSIS — J441 Chronic obstructive pulmonary disease with (acute) exacerbation: Secondary | ICD-10-CM | POA: Diagnosis not present

## 2021-11-15 DIAGNOSIS — E440 Moderate protein-calorie malnutrition: Secondary | ICD-10-CM | POA: Diagnosis not present

## 2021-11-15 DIAGNOSIS — E1165 Type 2 diabetes mellitus with hyperglycemia: Secondary | ICD-10-CM | POA: Diagnosis not present

## 2021-11-15 DIAGNOSIS — D631 Anemia in chronic kidney disease: Secondary | ICD-10-CM | POA: Diagnosis not present

## 2021-11-15 DIAGNOSIS — I4891 Unspecified atrial fibrillation: Secondary | ICD-10-CM | POA: Diagnosis not present

## 2021-11-15 DIAGNOSIS — I130 Hypertensive heart and chronic kidney disease with heart failure and stage 1 through stage 4 chronic kidney disease, or unspecified chronic kidney disease: Secondary | ICD-10-CM | POA: Diagnosis not present

## 2021-11-15 DIAGNOSIS — J9611 Chronic respiratory failure with hypoxia: Secondary | ICD-10-CM | POA: Diagnosis not present

## 2021-11-15 DIAGNOSIS — I5022 Chronic systolic (congestive) heart failure: Secondary | ICD-10-CM | POA: Diagnosis not present

## 2021-11-15 DIAGNOSIS — N1832 Chronic kidney disease, stage 3b: Secondary | ICD-10-CM | POA: Diagnosis not present

## 2021-11-15 DIAGNOSIS — E785 Hyperlipidemia, unspecified: Secondary | ICD-10-CM | POA: Diagnosis not present

## 2021-11-15 DIAGNOSIS — J9612 Chronic respiratory failure with hypercapnia: Secondary | ICD-10-CM | POA: Diagnosis not present

## 2021-11-15 NOTE — Telephone Encounter (Signed)
Transition Care Management Unsuccessful Follow-up Telephone Call  Date of discharge and from where:  [name] 11/14/2021  Attempts:  2nd Attempt  Reason for unsuccessful TCM follow-up call:  Unable to reach patient

## 2021-11-18 DIAGNOSIS — I130 Hypertensive heart and chronic kidney disease with heart failure and stage 1 through stage 4 chronic kidney disease, or unspecified chronic kidney disease: Secondary | ICD-10-CM | POA: Diagnosis not present

## 2021-11-18 DIAGNOSIS — I2510 Atherosclerotic heart disease of native coronary artery without angina pectoris: Secondary | ICD-10-CM | POA: Diagnosis not present

## 2021-11-18 DIAGNOSIS — I21A1 Myocardial infarction type 2: Secondary | ICD-10-CM | POA: Diagnosis not present

## 2021-11-18 DIAGNOSIS — I4891 Unspecified atrial fibrillation: Secondary | ICD-10-CM | POA: Diagnosis not present

## 2021-11-18 DIAGNOSIS — I5033 Acute on chronic diastolic (congestive) heart failure: Secondary | ICD-10-CM | POA: Diagnosis not present

## 2021-11-18 DIAGNOSIS — E1122 Type 2 diabetes mellitus with diabetic chronic kidney disease: Secondary | ICD-10-CM | POA: Diagnosis not present

## 2021-11-20 ENCOUNTER — Ambulatory Visit: Payer: Medicare Other | Admitting: Family Medicine

## 2021-11-20 ENCOUNTER — Ambulatory Visit (INDEPENDENT_AMBULATORY_CARE_PROVIDER_SITE_OTHER): Payer: Medicare Other

## 2021-11-20 ENCOUNTER — Telehealth: Payer: Self-pay | Admitting: Family Medicine

## 2021-11-20 DIAGNOSIS — E785 Hyperlipidemia, unspecified: Secondary | ICD-10-CM

## 2021-11-20 DIAGNOSIS — I2510 Atherosclerotic heart disease of native coronary artery without angina pectoris: Secondary | ICD-10-CM | POA: Diagnosis not present

## 2021-11-20 DIAGNOSIS — I4891 Unspecified atrial fibrillation: Secondary | ICD-10-CM | POA: Diagnosis not present

## 2021-11-20 DIAGNOSIS — I5033 Acute on chronic diastolic (congestive) heart failure: Secondary | ICD-10-CM | POA: Diagnosis not present

## 2021-11-20 DIAGNOSIS — I130 Hypertensive heart and chronic kidney disease with heart failure and stage 1 through stage 4 chronic kidney disease, or unspecified chronic kidney disease: Secondary | ICD-10-CM | POA: Diagnosis not present

## 2021-11-20 DIAGNOSIS — E1122 Type 2 diabetes mellitus with diabetic chronic kidney disease: Secondary | ICD-10-CM | POA: Diagnosis not present

## 2021-11-20 DIAGNOSIS — I21A1 Myocardial infarction type 2: Secondary | ICD-10-CM | POA: Diagnosis not present

## 2021-11-20 MED ORDER — DAPAGLIFLOZIN PROPANEDIOL 10 MG PO TABS: 10.0000 mg | ORAL_TABLET | Freq: Every day | ORAL | 3 refills | Status: DC

## 2021-11-20 NOTE — Progress Notes (Signed)
Patient hospitalized and Wilder Glade was changed from 5 mg to 10 mg daily. Patient called requesting his patient assistance be updated. Updated Farxiga application sent into [employer].   Next CPP appointment moved to 01/20/22 given recent health changes.   Junius Argyle, PharmD, Para [place], CPP Clinical Pharmacist Practitioner  [facility]  [phone]

## 2021-11-20 NOTE — Telephone Encounter (Signed)
Spoke to patient and advise on message.  Appointment scheduled 11/22/21 @ 11:30 am. Patient agreeable.  Dm/cma

## 2021-11-21 ENCOUNTER — Telehealth: Payer: Medicare Other | Admitting: Family Medicine

## 2021-11-21 ENCOUNTER — Other Ambulatory Visit: Payer: Self-pay

## 2021-11-21 ENCOUNTER — Telehealth: Payer: Self-pay | Admitting: Family Medicine

## 2021-11-21 DIAGNOSIS — I4891 Unspecified atrial fibrillation: Secondary | ICD-10-CM | POA: Diagnosis not present

## 2021-11-21 DIAGNOSIS — I130 Hypertensive heart and chronic kidney disease with heart failure and stage 1 through stage 4 chronic kidney disease, or unspecified chronic kidney disease: Secondary | ICD-10-CM | POA: Diagnosis not present

## 2021-11-21 DIAGNOSIS — E1122 Type 2 diabetes mellitus with diabetic chronic kidney disease: Secondary | ICD-10-CM | POA: Diagnosis not present

## 2021-11-21 DIAGNOSIS — I2510 Atherosclerotic heart disease of native coronary artery without angina pectoris: Secondary | ICD-10-CM | POA: Diagnosis not present

## 2021-11-21 DIAGNOSIS — I5033 Acute on chronic diastolic (congestive) heart failure: Secondary | ICD-10-CM | POA: Diagnosis not present

## 2021-11-21 DIAGNOSIS — I21A1 Myocardial infarction type 2: Secondary | ICD-10-CM | POA: Diagnosis not present

## 2021-11-21 NOTE — Telephone Encounter (Signed)
Verbal Order request for OT: 1x a week for 3 weeks , 2x a week for 2 weeks, 1x a week for two weeks.   Meriel FlavorsSharyn Lull, [phone]

## 2021-11-21 NOTE — Telephone Encounter (Signed)
Patient has an appointment 11/22/21@ 11:30 am.  Will do then.  Dm/cma

## 2021-11-22 ENCOUNTER — Telehealth: Payer: Self-pay

## 2021-11-22 ENCOUNTER — Telehealth: Payer: Self-pay | Admitting: Family Medicine

## 2021-11-22 ENCOUNTER — Other Ambulatory Visit (HOSPITAL_COMMUNITY): Payer: Self-pay

## 2021-11-22 ENCOUNTER — Ambulatory Visit (INDEPENDENT_AMBULATORY_CARE_PROVIDER_SITE_OTHER): Payer: Medicare Other | Admitting: Family Medicine

## 2021-11-22 ENCOUNTER — Encounter: Payer: Self-pay | Admitting: Family Medicine

## 2021-11-22 VITALS — BP 114/72 | HR 74 | Temp 97.3°F | Ht 63.0 in | Wt 202.5 lb

## 2021-11-22 DIAGNOSIS — I480 Paroxysmal atrial fibrillation: Secondary | ICD-10-CM | POA: Diagnosis not present

## 2021-11-22 DIAGNOSIS — E785 Hyperlipidemia, unspecified: Secondary | ICD-10-CM

## 2021-11-22 DIAGNOSIS — E1169 Type 2 diabetes mellitus with other specified complication: Secondary | ICD-10-CM

## 2021-11-22 DIAGNOSIS — J449 Chronic obstructive pulmonary disease, unspecified: Secondary | ICD-10-CM

## 2021-11-22 DIAGNOSIS — I10 Essential (primary) hypertension: Secondary | ICD-10-CM

## 2021-11-22 DIAGNOSIS — I69959 Hemiplegia and hemiparesis following unspecified cerebrovascular disease affecting unspecified side: Secondary | ICD-10-CM

## 2021-11-22 DIAGNOSIS — I2510 Atherosclerotic heart disease of native coronary artery without angina pectoris: Secondary | ICD-10-CM | POA: Diagnosis not present

## 2021-11-22 DIAGNOSIS — I5032 Chronic diastolic (congestive) heart failure: Secondary | ICD-10-CM

## 2021-11-22 LAB — BASIC METABOLIC PANEL
BUN: 49 mg/dL — ABNORMAL HIGH (ref 6–23)
CO2: 34 mEq/L — ABNORMAL HIGH (ref 19–32)
Calcium: 8.8 mg/dL (ref 8.4–10.5)
Chloride: 93 mEq/L — ABNORMAL LOW (ref 96–112)
Creatinine, Ser: 1.57 mg/dL — ABNORMAL HIGH (ref 0.40–1.50)
GFR: 41.65 mL/min — ABNORMAL LOW (ref >60.00)
Glucose, Bld: 141 mg/dL — ABNORMAL HIGH (ref 70–99)
Potassium: 3.6 mEq/L (ref 3.5–5.1)
Sodium: 136 mEq/L (ref 135–145)

## 2021-11-22 MED ORDER — APIXABAN 5 MG PO TABS: 5.0000 mg | ORAL_TABLET | Freq: Two times a day (BID) | ORAL | 0 refills | Status: DC

## 2021-11-22 MED ORDER — TORSEMIDE 20 MG PO TABS: 40.0000 mg | ORAL_TABLET | Freq: Every day | ORAL | 1 refills | Status: DC

## 2021-11-22 MED ORDER — TORSEMIDE 20 MG PO TABS
40.0000 mg | ORAL_TABLET | Freq: Every day | ORAL | 1 refills | Status: DC
  Filled 2021-11-22: qty 60, 30d supply, fill #0

## 2021-11-22 NOTE — Telephone Encounter (Signed)
Order and OV notes sent to [facility] at [phone].  Dm/cma
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
## 2021-11-22 NOTE — Telephone Encounter (Signed)
[facility] called back and said they need a office note from todays visit and they also the narrative listed on the order to be included in the notes

## 2021-11-22 NOTE — Telephone Encounter (Signed)
Spoke to [place] Amber [phone], and gave verbal okay for OT and have speech therapy added as well. Dm/cma

## 2021-11-22 NOTE — Telephone Encounter (Signed)
What is the name of the medication? torsemide (DEMADEX) 20 MG tablet [[id number]]   Have you contacted your pharmacy to request a refill? [facility] is calling to say-they can not fill this script   Which pharmacy would you like this sent to?    Patient notified that their request is being sent to the clinical staff for review and that they should receive a call once it is complete. If they do not receive a call within 72 hours they can check with their pharmacy or our office.

## 2021-11-22 NOTE — Telephone Encounter (Signed)
[facility] @ [phone] to get their fax number for a DME order for wheel chair.  They are able to do this for him.  Faxed the form to [phone].  Patient notified [facility] phone. Dm/cma

## 2021-11-23 ENCOUNTER — Encounter (HOSPITAL_COMMUNITY): Payer: Self-pay | Admitting: Physician Assistant

## 2021-11-23 ENCOUNTER — Ambulatory Visit (HOSPITAL_COMMUNITY)
Admit: 2021-11-23 | Discharge: 2021-11-23 | Disposition: A | Discharge status: Home / Self Care | Payer: Medicare Other | Source: Ambulatory Visit | Attending: Physician Assistant | Admitting: Physician Assistant

## 2021-11-23 VITALS — BP 144/68 | HR 119 | Ht 63.0 in | Wt 202.5 lb

## 2021-11-23 DIAGNOSIS — Z6835 Body mass index (BMI) 35.0-35.9, adult: Secondary | ICD-10-CM | POA: Diagnosis not present

## 2021-11-23 DIAGNOSIS — I5032 Chronic diastolic (congestive) heart failure: Secondary | ICD-10-CM | POA: Insufficient documentation

## 2021-11-23 DIAGNOSIS — E1122 Type 2 diabetes mellitus with diabetic chronic kidney disease: Secondary | ICD-10-CM | POA: Insufficient documentation

## 2021-11-23 DIAGNOSIS — I130 Hypertensive heart and chronic kidney disease with heart failure and stage 1 through stage 4 chronic kidney disease, or unspecified chronic kidney disease: Secondary | ICD-10-CM | POA: Diagnosis not present

## 2021-11-23 DIAGNOSIS — I4891 Unspecified atrial fibrillation: Secondary | ICD-10-CM | POA: Diagnosis not present

## 2021-11-23 DIAGNOSIS — Z8673 Personal history of transient ischemic attack (TIA), and cerebral infarction without residual deficits: Secondary | ICD-10-CM | POA: Diagnosis not present

## 2021-11-23 DIAGNOSIS — I4819 Other persistent atrial fibrillation: Secondary | ICD-10-CM | POA: Insufficient documentation

## 2021-11-23 DIAGNOSIS — Z09 Encounter for follow-up examination after completed treatment for conditions other than malignant neoplasm: Secondary | ICD-10-CM | POA: Diagnosis not present

## 2021-11-23 DIAGNOSIS — Z86711 Personal history of pulmonary embolism: Secondary | ICD-10-CM | POA: Diagnosis not present

## 2021-11-23 DIAGNOSIS — Z87891 Personal history of nicotine dependence: Secondary | ICD-10-CM | POA: Diagnosis not present

## 2021-11-23 DIAGNOSIS — D6869 Other thrombophilia: Secondary | ICD-10-CM | POA: Diagnosis not present

## 2021-11-23 DIAGNOSIS — Z7901 Long term (current) use of anticoagulants: Secondary | ICD-10-CM | POA: Diagnosis not present

## 2021-11-23 DIAGNOSIS — I21A1 Myocardial infarction type 2: Secondary | ICD-10-CM | POA: Diagnosis not present

## 2021-11-23 DIAGNOSIS — N1832 Chronic kidney disease, stage 3b: Secondary | ICD-10-CM | POA: Diagnosis not present

## 2021-11-23 DIAGNOSIS — E669 Obesity, unspecified: Secondary | ICD-10-CM | POA: Diagnosis not present

## 2021-11-23 DIAGNOSIS — I4892 Unspecified atrial flutter: Secondary | ICD-10-CM | POA: Diagnosis not present

## 2021-11-23 DIAGNOSIS — E785 Hyperlipidemia, unspecified: Secondary | ICD-10-CM | POA: Insufficient documentation

## 2021-11-23 DIAGNOSIS — I2510 Atherosclerotic heart disease of native coronary artery without angina pectoris: Secondary | ICD-10-CM | POA: Insufficient documentation

## 2021-11-23 DIAGNOSIS — J449 Chronic obstructive pulmonary disease, unspecified: Secondary | ICD-10-CM | POA: Insufficient documentation

## 2021-11-23 DIAGNOSIS — I5033 Acute on chronic diastolic (congestive) heart failure: Secondary | ICD-10-CM | POA: Diagnosis not present

## 2021-11-23 NOTE — Patient Instructions (Signed)
Cardioversion scheduled for Friday, December 16th  - Come to afib clinic for labs at [place] at the [employer] and go to admitting at 930AM  - Do not eat or drink anything after midnight the night prior to your procedure.  - Take all your morning medication (except diabetic medications) with a sip of water prior to arrival.  - You will not be able to drive home after your procedure.  - Do NOT miss any doses of your blood thinner - if you should miss a dose please notify our office immediately.  - If you feel as if you go back into normal rhythm prior to scheduled cardioversion, please notify our office immediately. If your procedure is canceled in the cardioversion suite you will be charged a cancellation fee. Patients will be asked to: to mask in public and hand hygiene (no longer quarantine) in the 3 days prior to surgery, to report if any COVID-19-like illness or household contacts to COVID-19 to determine need for testing
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
## 2021-11-24 ENCOUNTER — Telehealth (HOSPITAL_COMMUNITY): Payer: Self-pay

## 2021-11-24 DIAGNOSIS — I21A1 Myocardial infarction type 2: Secondary | ICD-10-CM | POA: Diagnosis not present

## 2021-11-24 DIAGNOSIS — I130 Hypertensive heart and chronic kidney disease with heart failure and stage 1 through stage 4 chronic kidney disease, or unspecified chronic kidney disease: Secondary | ICD-10-CM | POA: Diagnosis not present

## 2021-11-24 DIAGNOSIS — I2510 Atherosclerotic heart disease of native coronary artery without angina pectoris: Secondary | ICD-10-CM | POA: Diagnosis not present

## 2021-11-24 DIAGNOSIS — E1122 Type 2 diabetes mellitus with diabetic chronic kidney disease: Secondary | ICD-10-CM | POA: Diagnosis not present

## 2021-11-24 DIAGNOSIS — I5033 Acute on chronic diastolic (congestive) heart failure: Secondary | ICD-10-CM | POA: Diagnosis not present

## 2021-11-24 DIAGNOSIS — I4891 Unspecified atrial fibrillation: Secondary | ICD-10-CM | POA: Diagnosis not present

## 2021-11-24 NOTE — Telephone Encounter (Signed)
Will check insurance benefits closer to scheduling and/or into the new year 2023. 

## 2021-11-27 ENCOUNTER — Other Ambulatory Visit (HOSPITAL_COMMUNITY): Payer: Self-pay

## 2021-11-27 ENCOUNTER — Telehealth (HOSPITAL_COMMUNITY): Payer: Self-pay

## 2021-11-27 DIAGNOSIS — I130 Hypertensive heart and chronic kidney disease with heart failure and stage 1 through stage 4 chronic kidney disease, or unspecified chronic kidney disease: Secondary | ICD-10-CM | POA: Diagnosis not present

## 2021-11-27 DIAGNOSIS — I2510 Atherosclerotic heart disease of native coronary artery without angina pectoris: Secondary | ICD-10-CM | POA: Diagnosis not present

## 2021-11-27 DIAGNOSIS — I21A1 Myocardial infarction type 2: Secondary | ICD-10-CM | POA: Diagnosis not present

## 2021-11-27 DIAGNOSIS — I4891 Unspecified atrial fibrillation: Secondary | ICD-10-CM | POA: Diagnosis not present

## 2021-11-27 DIAGNOSIS — E1122 Type 2 diabetes mellitus with diabetic chronic kidney disease: Secondary | ICD-10-CM | POA: Diagnosis not present

## 2021-11-27 DIAGNOSIS — I5033 Acute on chronic diastolic (congestive) heart failure: Secondary | ICD-10-CM | POA: Diagnosis not present

## 2021-11-27 NOTE — Telephone Encounter (Signed)
Pharmacy Transitions of Care Follow-up Telephone Call  Date of discharge: 11/14/21  Discharge Diagnosis: Afib  How have you been since you were released from the hospital? "Muddling through"   Medication changes made at discharge:  - START: Eliquis, torsemide  - STOPPED: aspirin, potassium, furosemide, metolazone  - CHANGED: allopurinol, celexa, trazodone  Medication changes verified by the patient? yes   Medication Accessibility:  Home Pharmacy: Pt has home pharmacy and has already requested a refill on Eliquis. His pharmacy will most likely be changing to mail order in January as his insurance is changing.    Was the patient provided with refills on discharged medications? no   Have all prescriptions been transferred from [facility] to home pharmacy? na   Is the patient able to afford medications? yes Notable copays: Eliquis Eligible patient assistance:     Medication Review: APIXABAN (ELIQUIS)  Apixaban 10 mg BID initiated . Will switch to apixaban 5 mg BID after 7 days   - Discussed importance of taking medication around the same time everyday  - Reviewed potential DDIs with patient  - Advised patient of medications to avoid (NSAIDs, ASA)  - Educated that Tylenol (acetaminophen) will be the preferred analgesic to prevent risk of bleeding  - Emphasized importance of monitoring for signs and symptoms of bleeding (abnormal bruising, prolonged bleeding, nose bleeds, bleeding from gums, discolored urine, black tarry stools)  - Advised patient to alert all providers of anticoagulation therapy prior to starting a new medication or having a procedure   Follow-up Appointments:  PCP Hospital f/u appt confirmed? yes Scheduled for cardioversion on 12/08/21.  [facility] f/u appt confirmed? Pt has bloodwork on 11/22/21  If their condition worsens, is the pt aware to call PCP or go to the Emergency Dept.? yes  Final Patient Assessment: [name] was short of breath and  "muddling through" when I talked to him. He is scheduled for a cardioversion in 11 days. He is taking his Eliquis and has already requested a refill from his home pharmacy. His insurance is changing January 1st and he will most likely be using  mail order for his medications. He thinks his Eliquis copay will decrease with the new insurance. He reports no bleeding, bruising, or other negative side effects from his medications. He does not take OTC medications.

## 2021-11-28 ENCOUNTER — Encounter (HOSPITAL_COMMUNITY): Payer: Self-pay | Admitting: Cardiology

## 2021-11-28 ENCOUNTER — Telehealth: Payer: Self-pay | Admitting: Cardiovascular Disease

## 2021-11-28 DIAGNOSIS — I5033 Acute on chronic diastolic (congestive) heart failure: Secondary | ICD-10-CM | POA: Diagnosis not present

## 2021-11-28 DIAGNOSIS — I130 Hypertensive heart and chronic kidney disease with heart failure and stage 1 through stage 4 chronic kidney disease, or unspecified chronic kidney disease: Secondary | ICD-10-CM | POA: Diagnosis not present

## 2021-11-28 DIAGNOSIS — I4891 Unspecified atrial fibrillation: Secondary | ICD-10-CM | POA: Diagnosis not present

## 2021-11-28 DIAGNOSIS — E1122 Type 2 diabetes mellitus with diabetic chronic kidney disease: Secondary | ICD-10-CM | POA: Diagnosis not present

## 2021-11-28 DIAGNOSIS — I2510 Atherosclerotic heart disease of native coronary artery without angina pectoris: Secondary | ICD-10-CM | POA: Diagnosis not present

## 2021-11-28 DIAGNOSIS — I5032 Chronic diastolic (congestive) heart failure: Secondary | ICD-10-CM

## 2021-11-28 DIAGNOSIS — I21A1 Myocardial infarction type 2: Secondary | ICD-10-CM | POA: Diagnosis not present

## 2021-11-28 MED ORDER — TORSEMIDE 20 MG PO TABS: 60.0000 mg | ORAL_TABLET | Freq: Every day | ORAL | 11 refills | Status: DC

## 2021-11-28 NOTE — Telephone Encounter (Signed)
Denise calling in to report dyspnea, weight gain and edema. 11/23/21 weight 202 lbs, Today (11/28/21) 205 lbs, lungs diminished bilaterally in the bases per [employer] with [employer]. Edema in BLE.  No missed doses of medication. No lifestyle changes. Heart rate 115-125, BP 142/72, 98% on 2L, afebrile.  Of note diagnosed with Afib 11/07/21, DCCV planned for Dec 16 though Afib Clinic. Will forward to MD for advisement.

## 2021-11-28 NOTE — Telephone Encounter (Signed)
STAT if HR is under 50 or over 120 (normal HR is 60-100 beats per minute)  What is your heart rate? 125  Do you have a log of your heart rate readings (document readings)?    Do you have any other symptoms? Gain weight   Pt c/o swelling: STAT is pt has developed SOB within 24 hours  How much weight have you gained and in what time span? Yes 4lbs in less then a week  If swelling, where is the swelling located? Yes feet and ankles  Are you currently taking a fluid pill? yes  Are you currently SOB? yes  Do you have a log of your daily weights (if so, list)?    Have you gained 3 pounds in a day or 5 pounds in a week? yes  Have you traveled recently? no

## 2021-11-28 NOTE — Telephone Encounter (Signed)
Can we have him increase her Torsemide to 40 mg po BID for three days then change daily dose to 60 mg am.  Thanks, chris   Patient verbalized understanding and asked that I send him this information over mychart as well incase he needs to look at it again.   Send in prescription.

## 2021-11-28 NOTE — Progress Notes (Signed)
Attempted to obtain medical history via telephone, unable to reach at this time. I left a voicemail to return pre surgical testing department's phone call.  

## 2021-11-29 DIAGNOSIS — L304 Erythema intertrigo: Secondary | ICD-10-CM | POA: Diagnosis not present

## 2021-11-29 DIAGNOSIS — L570 Actinic keratosis: Secondary | ICD-10-CM | POA: Diagnosis not present

## 2021-11-29 DIAGNOSIS — L814 Other melanin hyperpigmentation: Secondary | ICD-10-CM | POA: Diagnosis not present

## 2021-12-01 ENCOUNTER — Other Ambulatory Visit: Payer: Self-pay

## 2021-12-01 ENCOUNTER — Encounter: Payer: Self-pay | Admitting: Cardiovascular Disease

## 2021-12-01 ENCOUNTER — Ambulatory Visit (INDEPENDENT_AMBULATORY_CARE_PROVIDER_SITE_OTHER): Payer: Medicare Other | Admitting: Cardiovascular Disease

## 2021-12-01 VITALS — BP 108/60 | HR 84 | Ht 63.0 in | Wt 203.5 lb

## 2021-12-01 DIAGNOSIS — E7800 Pure hypercholesterolemia, unspecified: Secondary | ICD-10-CM

## 2021-12-01 DIAGNOSIS — I4819 Other persistent atrial fibrillation: Secondary | ICD-10-CM | POA: Diagnosis not present

## 2021-12-01 DIAGNOSIS — I4891 Unspecified atrial fibrillation: Secondary | ICD-10-CM | POA: Diagnosis not present

## 2021-12-01 DIAGNOSIS — I2510 Atherosclerotic heart disease of native coronary artery without angina pectoris: Secondary | ICD-10-CM | POA: Diagnosis not present

## 2021-12-01 DIAGNOSIS — E1122 Type 2 diabetes mellitus with diabetic chronic kidney disease: Secondary | ICD-10-CM | POA: Diagnosis not present

## 2021-12-01 DIAGNOSIS — I255 Ischemic cardiomyopathy: Secondary | ICD-10-CM | POA: Diagnosis not present

## 2021-12-01 DIAGNOSIS — I5032 Chronic diastolic (congestive) heart failure: Secondary | ICD-10-CM

## 2021-12-01 DIAGNOSIS — I10 Essential (primary) hypertension: Secondary | ICD-10-CM

## 2021-12-01 DIAGNOSIS — I5033 Acute on chronic diastolic (congestive) heart failure: Secondary | ICD-10-CM | POA: Diagnosis not present

## 2021-12-01 DIAGNOSIS — I21A1 Myocardial infarction type 2: Secondary | ICD-10-CM | POA: Diagnosis not present

## 2021-12-01 DIAGNOSIS — I130 Hypertensive heart and chronic kidney disease with heart failure and stage 1 through stage 4 chronic kidney disease, or unspecified chronic kidney disease: Secondary | ICD-10-CM | POA: Diagnosis not present

## 2021-12-01 NOTE — Patient Instructions (Signed)
Medication Instructions:  Your physician recommends that you continue on your current medications as directed. Please refer to the Current Medication list given to you today.  *If you need a refill on your cardiac medications before your next appointment, please call your pharmacy*   Lab Work: None ordered   If you have labs (blood work) drawn today and your tests are completely normal, you will receive your results only by: [place] (if you have MyChart) OR A paper copy in the mail If you have any lab test that is abnormal or we need to change your treatment, we will call you to review the results.   Testing/Procedures: None ordered    Follow-Up: Follow up as scheduled :1}    Other Instructions None
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
## 2021-12-04 DIAGNOSIS — I2510 Atherosclerotic heart disease of native coronary artery without angina pectoris: Secondary | ICD-10-CM | POA: Diagnosis not present

## 2021-12-04 DIAGNOSIS — I130 Hypertensive heart and chronic kidney disease with heart failure and stage 1 through stage 4 chronic kidney disease, or unspecified chronic kidney disease: Secondary | ICD-10-CM | POA: Diagnosis not present

## 2021-12-04 DIAGNOSIS — E1122 Type 2 diabetes mellitus with diabetic chronic kidney disease: Secondary | ICD-10-CM | POA: Diagnosis not present

## 2021-12-04 DIAGNOSIS — I21A1 Myocardial infarction type 2: Secondary | ICD-10-CM | POA: Diagnosis not present

## 2021-12-04 DIAGNOSIS — I5033 Acute on chronic diastolic (congestive) heart failure: Secondary | ICD-10-CM | POA: Diagnosis not present

## 2021-12-04 DIAGNOSIS — I4891 Unspecified atrial fibrillation: Secondary | ICD-10-CM | POA: Diagnosis not present

## 2021-12-05 ENCOUNTER — Telehealth: Payer: Self-pay | Admitting: Cardiovascular Disease

## 2021-12-05 DIAGNOSIS — I21A1 Myocardial infarction type 2: Secondary | ICD-10-CM | POA: Diagnosis not present

## 2021-12-05 DIAGNOSIS — I2510 Atherosclerotic heart disease of native coronary artery without angina pectoris: Secondary | ICD-10-CM | POA: Diagnosis not present

## 2021-12-05 DIAGNOSIS — I4891 Unspecified atrial fibrillation: Secondary | ICD-10-CM | POA: Diagnosis not present

## 2021-12-05 DIAGNOSIS — I130 Hypertensive heart and chronic kidney disease with heart failure and stage 1 through stage 4 chronic kidney disease, or unspecified chronic kidney disease: Secondary | ICD-10-CM | POA: Diagnosis not present

## 2021-12-05 DIAGNOSIS — I5033 Acute on chronic diastolic (congestive) heart failure: Secondary | ICD-10-CM | POA: Diagnosis not present

## 2021-12-05 DIAGNOSIS — E1122 Type 2 diabetes mellitus with diabetic chronic kidney disease: Secondary | ICD-10-CM | POA: Diagnosis not present

## 2021-12-05 NOTE — Telephone Encounter (Signed)
Received from Dr. Angelena Form: "Increase Torsemide to 40 mg BID for next three days. Cdm"   Pt notified and is in agreement with this plan.  He will take one torsemide tablet this afternoon since he has taken 60 mg this am.  Wed and Demetrius Charity he will will do 40 mg BID and then Friday after his cardioversion he will go back to 60 mg daily and follow weights/symptoms from there.

## 2021-12-05 NOTE — Telephone Encounter (Signed)
STAT if HR is under 50 or over 120 (normal HR is 60-100 beats per minute)  What is your heart rate? 75  Do you have a log of your heart rate readings (document readings)? 75-140 then goes back down  Do you have any other symptoms? fatigued   Insurance underwriter with [employer] calling to inform the patient's HR is ranging 75-140 then coming back down again intermittently. She states she did see him today and is not currently with him. She says he was fatigued and did not sleep well. She says he is moving well independently, but does still get SOB. She says he had a slight weight increase from yesterday of about half a pound. She says he also has a cardioversion planned for Friday. She says if there are any questions to call her at: [phone]

## 2021-12-05 NOTE — Telephone Encounter (Signed)
Pt was seen 12/01/21 by Dr. Angelena Form:  2. Chronic diastolic CHF: He is now taking Torsemide 60 mg daily after using 40 mg BID for 3 days. His weight is down a few lbs and he feels better. Minimal LE edema. Will continue current dose of Torsemide for now   Now calling w slight weight increase for DCCV on 12/08/21.

## 2021-12-05 NOTE — Telephone Encounter (Signed)
Pt c/o swelling: STAT is pt has developed SOB within 24 hours  If swelling, where is the swelling located? Feet and legs  How much weight have you gained and in what time span? 4lbs since last Friday, 12/9  Have you gained 3 pounds in a day or 5 pounds in a week? no  Do you have a log of your daily weights (if so, list)?  12/13 207.5 12/12 206.5 12/11 206.0 12/10 205.5 12/9 203.5 12/8 205.5 12/7 205.0  Are you currently taking a fluid pill? yes  Are you currently SOB? Patient has COPD he always has SOB  Have you traveled recently? No  He doesn't think the 3 tablets of torsemide (DEMADEX) 20 MG tablet, is working.  He would like to know what he should do.

## 2021-12-05 NOTE — Telephone Encounter (Signed)
I spoke w the patient.  He is scheduled for cardioversion on 12/08/21. See other telephone note from today for complete details.

## 2021-12-06 DIAGNOSIS — I4891 Unspecified atrial fibrillation: Secondary | ICD-10-CM | POA: Diagnosis not present

## 2021-12-06 DIAGNOSIS — E1122 Type 2 diabetes mellitus with diabetic chronic kidney disease: Secondary | ICD-10-CM | POA: Diagnosis not present

## 2021-12-06 DIAGNOSIS — I2510 Atherosclerotic heart disease of native coronary artery without angina pectoris: Secondary | ICD-10-CM | POA: Diagnosis not present

## 2021-12-06 DIAGNOSIS — I21A1 Myocardial infarction type 2: Secondary | ICD-10-CM | POA: Diagnosis not present

## 2021-12-06 DIAGNOSIS — I5033 Acute on chronic diastolic (congestive) heart failure: Secondary | ICD-10-CM | POA: Diagnosis not present

## 2021-12-06 DIAGNOSIS — I130 Hypertensive heart and chronic kidney disease with heart failure and stage 1 through stage 4 chronic kidney disease, or unspecified chronic kidney disease: Secondary | ICD-10-CM | POA: Diagnosis not present

## 2021-12-07 DIAGNOSIS — I4891 Unspecified atrial fibrillation: Secondary | ICD-10-CM | POA: Diagnosis not present

## 2021-12-07 DIAGNOSIS — I5033 Acute on chronic diastolic (congestive) heart failure: Secondary | ICD-10-CM | POA: Diagnosis not present

## 2021-12-07 DIAGNOSIS — I130 Hypertensive heart and chronic kidney disease with heart failure and stage 1 through stage 4 chronic kidney disease, or unspecified chronic kidney disease: Secondary | ICD-10-CM | POA: Diagnosis not present

## 2021-12-07 DIAGNOSIS — E1122 Type 2 diabetes mellitus with diabetic chronic kidney disease: Secondary | ICD-10-CM | POA: Diagnosis not present

## 2021-12-07 DIAGNOSIS — I2510 Atherosclerotic heart disease of native coronary artery without angina pectoris: Secondary | ICD-10-CM | POA: Diagnosis not present

## 2021-12-07 DIAGNOSIS — I21A1 Myocardial infarction type 2: Secondary | ICD-10-CM | POA: Diagnosis not present

## 2021-12-08 ENCOUNTER — Ambulatory Visit (HOSPITAL_COMMUNITY): Payer: Medicare Other | Admitting: Certified Registered Nurse Anesthetist

## 2021-12-08 ENCOUNTER — Encounter (HOSPITAL_COMMUNITY): Payer: Self-pay | Admitting: Cardiology

## 2021-12-08 ENCOUNTER — Encounter (HOSPITAL_COMMUNITY)
Admission: RE | Disposition: A | Discharge status: Home / Self Care | Payer: Self-pay | Source: Home / Self Care | Attending: Cardiology

## 2021-12-08 ENCOUNTER — Other Ambulatory Visit: Payer: Self-pay

## 2021-12-08 ENCOUNTER — Ambulatory Visit (HOSPITAL_COMMUNITY)
Admission: RE | Admit: 2021-12-08 | Discharge: 2021-12-08 | Disposition: A | Discharge status: Home / Self Care | Payer: Medicare Other | Attending: Cardiology | Admitting: Cardiology

## 2021-12-08 ENCOUNTER — Ambulatory Visit (HOSPITAL_COMMUNITY)
Admission: RE | Admit: 2021-12-08 | Discharge: 2021-12-08 | Disposition: A | Discharge status: Home / Self Care | Payer: Medicare Other | Source: Ambulatory Visit | Attending: Physician Assistant | Admitting: Physician Assistant

## 2021-12-08 DIAGNOSIS — I5042 Chronic combined systolic (congestive) and diastolic (congestive) heart failure: Secondary | ICD-10-CM | POA: Diagnosis not present

## 2021-12-08 DIAGNOSIS — E785 Hyperlipidemia, unspecified: Secondary | ICD-10-CM | POA: Insufficient documentation

## 2021-12-08 DIAGNOSIS — I252 Old myocardial infarction: Secondary | ICD-10-CM | POA: Diagnosis not present

## 2021-12-08 DIAGNOSIS — E876 Hypokalemia: Secondary | ICD-10-CM | POA: Diagnosis not present

## 2021-12-08 DIAGNOSIS — I2510 Atherosclerotic heart disease of native coronary artery without angina pectoris: Secondary | ICD-10-CM | POA: Insufficient documentation

## 2021-12-08 DIAGNOSIS — N183 Chronic kidney disease, stage 3 unspecified: Secondary | ICD-10-CM | POA: Diagnosis not present

## 2021-12-08 DIAGNOSIS — E669 Obesity, unspecified: Secondary | ICD-10-CM | POA: Diagnosis not present

## 2021-12-08 DIAGNOSIS — Z7902 Long term (current) use of antithrombotics/antiplatelets: Secondary | ICD-10-CM | POA: Insufficient documentation

## 2021-12-08 DIAGNOSIS — Z79899 Other long term (current) drug therapy: Secondary | ICD-10-CM | POA: Insufficient documentation

## 2021-12-08 DIAGNOSIS — I4819 Other persistent atrial fibrillation: Secondary | ICD-10-CM | POA: Insufficient documentation

## 2021-12-08 DIAGNOSIS — J440 Chronic obstructive pulmonary disease with acute lower respiratory infection: Secondary | ICD-10-CM | POA: Diagnosis not present

## 2021-12-08 DIAGNOSIS — Z8673 Personal history of transient ischemic attack (TIA), and cerebral infarction without residual deficits: Secondary | ICD-10-CM | POA: Diagnosis not present

## 2021-12-08 DIAGNOSIS — I255 Ischemic cardiomyopathy: Secondary | ICD-10-CM | POA: Diagnosis not present

## 2021-12-08 DIAGNOSIS — Z7901 Long term (current) use of anticoagulants: Secondary | ICD-10-CM | POA: Insufficient documentation

## 2021-12-08 DIAGNOSIS — I130 Hypertensive heart and chronic kidney disease with heart failure and stage 1 through stage 4 chronic kidney disease, or unspecified chronic kidney disease: Secondary | ICD-10-CM | POA: Diagnosis not present

## 2021-12-08 DIAGNOSIS — Z6836 Body mass index (BMI) 36.0-36.9, adult: Secondary | ICD-10-CM | POA: Insufficient documentation

## 2021-12-08 DIAGNOSIS — E1122 Type 2 diabetes mellitus with diabetic chronic kidney disease: Secondary | ICD-10-CM | POA: Insufficient documentation

## 2021-12-08 DIAGNOSIS — I4891 Unspecified atrial fibrillation: Secondary | ICD-10-CM | POA: Diagnosis not present

## 2021-12-08 DIAGNOSIS — D509 Iron deficiency anemia, unspecified: Secondary | ICD-10-CM | POA: Diagnosis not present

## 2021-12-08 HISTORY — PX: CARDIOVERSION: SHX1299

## 2021-12-08 LAB — CBC
HCT: 38.4 % — ABNORMAL LOW (ref 39.0–52.0)
Hemoglobin: 12 g/dL — ABNORMAL LOW (ref 13.0–17.0)
MCH: 30.1 pg (ref 26.0–34.0)
MCHC: 31.3 g/dL (ref 30.0–36.0)
MCV: 96.2 fL (ref 80.0–100.0)
Platelets: 172 10*3/uL (ref 150–400)
RBC: 3.99 MIL/uL — ABNORMAL LOW (ref 4.22–5.81)
RDW: 14.6 % (ref 11.5–15.5)
WBC: 6.1 10*3/uL (ref 4.0–10.5)
nRBC: 0 % (ref 0.0–0.2)

## 2021-12-08 LAB — BASIC METABOLIC PANEL
Anion gap: 14 (ref 5–15)
BUN: 32 mg/dL — ABNORMAL HIGH (ref 8–23)
CO2: 24 mmol/L (ref 22–32)
Calcium: 8.5 mg/dL — ABNORMAL LOW (ref 8.9–10.3)
Chloride: 99 mmol/L (ref 98–111)
Creatinine, Ser: 1.57 mg/dL — ABNORMAL HIGH (ref 0.61–1.24)
GFR, Estimated: 45 mL/min — ABNORMAL LOW (ref >60)
Glucose, Bld: 132 mg/dL — ABNORMAL HIGH (ref 70–99)
Potassium: 3.7 mmol/L (ref 3.5–5.1)
Sodium: 137 mmol/L (ref 135–145)

## 2021-12-08 LAB — GLUCOSE, CAPILLARY: Glucose-Capillary: 124 mg/dL — ABNORMAL HIGH (ref 70–99)

## 2021-12-08 SURGERY — CARDIOVERSION
Anesthesia: General

## 2021-12-08 MED ORDER — SODIUM CHLORIDE 0.9 % IV SOLN: INTRAVENOUS | Status: DC

## 2021-12-08 MED ORDER — PROPOFOL 10 MG/ML IV BOLUS
INTRAVENOUS | Status: DC | PRN
  Administered 2021-12-08: 50 mg via INTRAVENOUS

## 2021-12-08 MED ORDER — LIDOCAINE 2% (20 MG/ML) 5 ML SYRINGE
INTRAMUSCULAR | Status: DC | PRN
  Administered 2021-12-08: 100 mg via INTRAVENOUS

## 2021-12-08 NOTE — Anesthesia Preprocedure Evaluation (Signed)
Anesthesia Evaluation  Patient identified by MRN, date of birth, ID band Patient awake    Reviewed: Allergy & Precautions, NPO status , Patient's Chart, lab work & pertinent test results  History of Anesthesia Complications Negative for: history of anesthetic complications  Airway Mallampati: II  TM Distance: >3 FB Neck ROM: Full    Dental   Pulmonary COPD,  oxygen dependent, former smoker, PE   Pulmonary exam normal        Cardiovascular hypertension, + CAD, + Past MI, + Cardiac Stents, + Peripheral Vascular Disease and +CHF  + dysrhythmias Atrial Fibrillation  Rhythm:Irregular Rate:Tachycardia     Neuro/Psych CVA    GI/Hepatic Neg liver ROS, GERD  ,  Endo/Other  diabetes, Type 2  Renal/GU CRFRenal disease  negative genitourinary   Musculoskeletal negative musculoskeletal ROS (+)   Abdominal   Peds  Hematology negative hematology ROS (+)   Anesthesia Other Findings   Reproductive/Obstetrics                             Anesthesia Physical Anesthesia Plan  ASA: 3  Anesthesia Plan: General   Post-op Pain Management: Minimal or no pain anticipated   Induction: Intravenous  PONV Risk Score and Plan: TIVA and Treatment may vary due to age or medical condition  Airway Management Planned: Mask  Additional Equipment: None  Intra-op Plan:   Post-operative Plan:   Informed Consent: I have reviewed the patients History and Physical, chart, labs and discussed the procedure including the risks, benefits and alternatives for the proposed anesthesia with the patient or authorized representative who has indicated his/her understanding and acceptance.       Plan Discussed with:   Anesthesia Plan Comments:         Anesthesia Quick Evaluation

## 2021-12-08 NOTE — Anesthesia Postprocedure Evaluation (Signed)
Anesthesia Post Note  Patient: [name].  Procedure(s) Performed: CARDIOVERSION     Patient location during evaluation: Endoscopy Anesthesia Type: General Level of consciousness: awake and alert Pain management: pain level controlled Vital Signs Assessment: post-procedure vital signs reviewed and stable Respiratory status: spontaneous breathing, nonlabored ventilation and respiratory function stable Cardiovascular status: blood pressure returned to baseline and stable Postop Assessment: no apparent nausea or vomiting Anesthetic complications: no   No notable events documented.  Last Vitals:  Vitals:   12/08/21 1108 12/08/21 1118  BP: (!) 143/58 (!) 151/63  Pulse: 81 84  Resp: 14 17  Temp:    SpO2: 95% 95%    Last Pain:  Vitals:   12/08/21 1118  TempSrc:   PainSc: 0-No pain                 Lidia Collum

## 2021-12-08 NOTE — Interval H&P Note (Signed)
History and Physical Interval Note:  12/08/2021 10:03 AM  [name].  has presented today for surgery, with the diagnosis of A-FIB.  The various methods of treatment have been discussed with the patient and family. After consideration of risks, benefits and other options for treatment, the patient has consented to  Procedure(s): CARDIOVERSION (N/A) as a surgical intervention.  The patient's history has been reviewed, patient examined, no change in status, stable for surgery.  I have reviewed the patient's chart and labs.  Questions were answered to the patient's satisfaction.     Freada Bergeron

## 2021-12-08 NOTE — Anesthesia Procedure Notes (Signed)
Procedure Name: General with mask airway Date/Time: 12/08/2021 10:41 AM Performed by: Carolan Clines, CRNA Pre-anesthesia Checklist: Patient identified, Emergency Drugs available, Suction available, Patient being monitored and Timeout performed Patient Re-evaluated:Patient Re-evaluated prior to induction Oxygen Delivery Method: Ambu bag Preoxygenation: Pre-oxygenation with 100% oxygen Induction Type: IV induction Dental Injury: Teeth and Oropharynx as per pre-operative assessment

## 2021-12-08 NOTE — Transfer of Care (Signed)
Immediate Anesthesia Transfer of Care Note  Patient: [name].  Procedure(s) Performed: CARDIOVERSION  Patient Location: Endoscopy Unit  Anesthesia Type:General  Level of Consciousness: drowsy  Airway & Oxygen Therapy: Patient Spontanous Breathing  Post-op Assessment: Report given to RN and Post -op Vital signs reviewed and stable  Post vital signs: Reviewed and stable  Last Vitals:  Vitals Value Taken Time  BP 134/66   Temp    Pulse 82   Resp 17   SpO2 94     Last Pain:  Vitals:   12/08/21 0928  TempSrc: Temporal  PainSc: 0-No pain         Complications: No notable events documented.

## 2021-12-08 NOTE — Procedures (Signed)
Procedure: Electrical Cardioversion Indications:  Atrial Fibrillation  Procedure Details:  Consent: Risks of procedure as well as the alternatives and risks of each were explained to the (patient/caregiver).  Consent for procedure obtained.  Time Out: Verified patient identification, verified procedure, site/side was marked, verified correct patient position, special equipment/implants available, medications/allergies/relevent history reviewed, required imaging and test results available. PERFORMED.  Patient placed on cardiac monitor, pulse oximetry, supplemental oxygen as necessary.  Sedation given:  Propofol 50mg , lidocaine 100mg  Pacer pads placed anterior and posterior chest.  Cardioverted 3 time(s).  Cardioversion with synchronized biphasic 150J, 200J, 200J shock.  Evaluation: Findings: Post procedure EKG shows: NSR Complications: None Patient did tolerate procedure well.  Time Spent Directly with the Patient:  71minutes   Freada Bergeron 12/08/2021, 10:47 AM

## 2021-12-08 NOTE — Discharge Instructions (Signed)

## 2021-12-10 ENCOUNTER — Encounter (HOSPITAL_COMMUNITY): Payer: Self-pay | Admitting: Cardiology

## 2021-12-11 DIAGNOSIS — I21A1 Myocardial infarction type 2: Secondary | ICD-10-CM | POA: Diagnosis not present

## 2021-12-11 DIAGNOSIS — I2510 Atherosclerotic heart disease of native coronary artery without angina pectoris: Secondary | ICD-10-CM | POA: Diagnosis not present

## 2021-12-11 DIAGNOSIS — E1122 Type 2 diabetes mellitus with diabetic chronic kidney disease: Secondary | ICD-10-CM | POA: Diagnosis not present

## 2021-12-11 DIAGNOSIS — I5033 Acute on chronic diastolic (congestive) heart failure: Secondary | ICD-10-CM | POA: Diagnosis not present

## 2021-12-11 DIAGNOSIS — I4891 Unspecified atrial fibrillation: Secondary | ICD-10-CM | POA: Diagnosis not present

## 2021-12-11 DIAGNOSIS — I130 Hypertensive heart and chronic kidney disease with heart failure and stage 1 through stage 4 chronic kidney disease, or unspecified chronic kidney disease: Secondary | ICD-10-CM | POA: Diagnosis not present

## 2021-12-12 DIAGNOSIS — I5033 Acute on chronic diastolic (congestive) heart failure: Secondary | ICD-10-CM | POA: Diagnosis not present

## 2021-12-12 DIAGNOSIS — I4891 Unspecified atrial fibrillation: Secondary | ICD-10-CM | POA: Diagnosis not present

## 2021-12-12 DIAGNOSIS — I21A1 Myocardial infarction type 2: Secondary | ICD-10-CM | POA: Diagnosis not present

## 2021-12-12 DIAGNOSIS — I2510 Atherosclerotic heart disease of native coronary artery without angina pectoris: Secondary | ICD-10-CM | POA: Diagnosis not present

## 2021-12-12 DIAGNOSIS — E1122 Type 2 diabetes mellitus with diabetic chronic kidney disease: Secondary | ICD-10-CM | POA: Diagnosis not present

## 2021-12-12 DIAGNOSIS — I130 Hypertensive heart and chronic kidney disease with heart failure and stage 1 through stage 4 chronic kidney disease, or unspecified chronic kidney disease: Secondary | ICD-10-CM | POA: Diagnosis not present

## 2021-12-13 DIAGNOSIS — I5033 Acute on chronic diastolic (congestive) heart failure: Secondary | ICD-10-CM | POA: Diagnosis not present

## 2021-12-13 DIAGNOSIS — I130 Hypertensive heart and chronic kidney disease with heart failure and stage 1 through stage 4 chronic kidney disease, or unspecified chronic kidney disease: Secondary | ICD-10-CM | POA: Diagnosis not present

## 2021-12-13 DIAGNOSIS — E1122 Type 2 diabetes mellitus with diabetic chronic kidney disease: Secondary | ICD-10-CM | POA: Diagnosis not present

## 2021-12-13 DIAGNOSIS — I21A1 Myocardial infarction type 2: Secondary | ICD-10-CM | POA: Diagnosis not present

## 2021-12-13 DIAGNOSIS — I2510 Atherosclerotic heart disease of native coronary artery without angina pectoris: Secondary | ICD-10-CM | POA: Diagnosis not present

## 2021-12-13 DIAGNOSIS — I4891 Unspecified atrial fibrillation: Secondary | ICD-10-CM | POA: Diagnosis not present

## 2021-12-15 DIAGNOSIS — E876 Hypokalemia: Secondary | ICD-10-CM | POA: Diagnosis not present

## 2021-12-15 DIAGNOSIS — M170 Bilateral primary osteoarthritis of knee: Secondary | ICD-10-CM | POA: Diagnosis not present

## 2021-12-15 DIAGNOSIS — N1832 Chronic kidney disease, stage 3b: Secondary | ICD-10-CM | POA: Diagnosis not present

## 2021-12-15 DIAGNOSIS — E1122 Type 2 diabetes mellitus with diabetic chronic kidney disease: Secondary | ICD-10-CM | POA: Diagnosis not present

## 2021-12-15 DIAGNOSIS — D631 Anemia in chronic kidney disease: Secondary | ICD-10-CM | POA: Diagnosis not present

## 2021-12-15 DIAGNOSIS — E1151 Type 2 diabetes mellitus with diabetic peripheral angiopathy without gangrene: Secondary | ICD-10-CM | POA: Diagnosis not present

## 2021-12-15 DIAGNOSIS — I130 Hypertensive heart and chronic kidney disease with heart failure and stage 1 through stage 4 chronic kidney disease, or unspecified chronic kidney disease: Secondary | ICD-10-CM | POA: Diagnosis not present

## 2021-12-15 DIAGNOSIS — I21A1 Myocardial infarction type 2: Secondary | ICD-10-CM | POA: Diagnosis not present

## 2021-12-15 DIAGNOSIS — N2581 Secondary hyperparathyroidism of renal origin: Secondary | ICD-10-CM | POA: Diagnosis not present

## 2021-12-15 DIAGNOSIS — E1169 Type 2 diabetes mellitus with other specified complication: Secondary | ICD-10-CM | POA: Diagnosis not present

## 2021-12-15 DIAGNOSIS — E785 Hyperlipidemia, unspecified: Secondary | ICD-10-CM | POA: Diagnosis not present

## 2021-12-15 DIAGNOSIS — I2510 Atherosclerotic heart disease of native coronary artery without angina pectoris: Secondary | ICD-10-CM | POA: Diagnosis not present

## 2021-12-15 DIAGNOSIS — F32A Depression, unspecified: Secondary | ICD-10-CM | POA: Diagnosis not present

## 2021-12-15 DIAGNOSIS — N179 Acute kidney failure, unspecified: Secondary | ICD-10-CM | POA: Diagnosis not present

## 2021-12-15 DIAGNOSIS — E871 Hypo-osmolality and hyponatremia: Secondary | ICD-10-CM | POA: Diagnosis not present

## 2021-12-15 DIAGNOSIS — J9611 Chronic respiratory failure with hypoxia: Secondary | ICD-10-CM | POA: Diagnosis not present

## 2021-12-15 DIAGNOSIS — M160 Bilateral primary osteoarthritis of hip: Secondary | ICD-10-CM | POA: Diagnosis not present

## 2021-12-15 DIAGNOSIS — J9612 Chronic respiratory failure with hypercapnia: Secondary | ICD-10-CM | POA: Diagnosis not present

## 2021-12-15 DIAGNOSIS — E440 Moderate protein-calorie malnutrition: Secondary | ICD-10-CM | POA: Diagnosis not present

## 2021-12-15 DIAGNOSIS — J441 Chronic obstructive pulmonary disease with (acute) exacerbation: Secondary | ICD-10-CM | POA: Diagnosis not present

## 2021-12-15 DIAGNOSIS — E1165 Type 2 diabetes mellitus with hyperglycemia: Secondary | ICD-10-CM | POA: Diagnosis not present

## 2021-12-15 DIAGNOSIS — I5033 Acute on chronic diastolic (congestive) heart failure: Secondary | ICD-10-CM | POA: Diagnosis not present

## 2021-12-15 DIAGNOSIS — I4891 Unspecified atrial fibrillation: Secondary | ICD-10-CM | POA: Diagnosis not present

## 2021-12-15 DIAGNOSIS — I5022 Chronic systolic (congestive) heart failure: Secondary | ICD-10-CM | POA: Diagnosis not present

## 2021-12-15 DIAGNOSIS — D509 Iron deficiency anemia, unspecified: Secondary | ICD-10-CM | POA: Diagnosis not present

## 2021-12-19 DIAGNOSIS — I4891 Unspecified atrial fibrillation: Secondary | ICD-10-CM | POA: Diagnosis not present

## 2021-12-19 DIAGNOSIS — I21A1 Myocardial infarction type 2: Secondary | ICD-10-CM | POA: Diagnosis not present

## 2021-12-19 DIAGNOSIS — I2510 Atherosclerotic heart disease of native coronary artery without angina pectoris: Secondary | ICD-10-CM | POA: Diagnosis not present

## 2021-12-19 DIAGNOSIS — I5033 Acute on chronic diastolic (congestive) heart failure: Secondary | ICD-10-CM | POA: Diagnosis not present

## 2021-12-19 DIAGNOSIS — E1122 Type 2 diabetes mellitus with diabetic chronic kidney disease: Secondary | ICD-10-CM | POA: Diagnosis not present

## 2021-12-19 DIAGNOSIS — I130 Hypertensive heart and chronic kidney disease with heart failure and stage 1 through stage 4 chronic kidney disease, or unspecified chronic kidney disease: Secondary | ICD-10-CM | POA: Diagnosis not present
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
## 2021-12-20 ENCOUNTER — Other Ambulatory Visit: Payer: Self-pay

## 2021-12-20 ENCOUNTER — Ambulatory Visit (HOSPITAL_COMMUNITY)
Admission: RE | Admit: 2021-12-20 | Discharge: 2021-12-20 | Disposition: A | Discharge status: Home / Self Care | Payer: Medicare Other | Source: Ambulatory Visit | Attending: Physician Assistant | Admitting: Physician Assistant

## 2021-12-20 ENCOUNTER — Ambulatory Visit (INDEPENDENT_AMBULATORY_CARE_PROVIDER_SITE_OTHER): Payer: Medicare Other | Admitting: Family Medicine

## 2021-12-20 ENCOUNTER — Encounter (HOSPITAL_COMMUNITY): Payer: Self-pay | Admitting: Physician Assistant

## 2021-12-20 ENCOUNTER — Encounter: Payer: Self-pay | Admitting: Family Medicine

## 2021-12-20 VITALS — BP 128/62 | HR 87 | Ht 63.0 in | Wt 205.5 lb

## 2021-12-20 VITALS — BP 122/64 | HR 90 | Temp 97.0°F | Ht 63.0 in | Wt 205.0 lb

## 2021-12-20 DIAGNOSIS — Z8673 Personal history of transient ischemic attack (TIA), and cerebral infarction without residual deficits: Secondary | ICD-10-CM | POA: Diagnosis not present

## 2021-12-20 DIAGNOSIS — Z6836 Body mass index (BMI) 36.0-36.9, adult: Secondary | ICD-10-CM | POA: Insufficient documentation

## 2021-12-20 DIAGNOSIS — I2510 Atherosclerotic heart disease of native coronary artery without angina pectoris: Secondary | ICD-10-CM | POA: Insufficient documentation

## 2021-12-20 DIAGNOSIS — E669 Obesity, unspecified: Secondary | ICD-10-CM | POA: Diagnosis not present

## 2021-12-20 DIAGNOSIS — E785 Hyperlipidemia, unspecified: Secondary | ICD-10-CM | POA: Diagnosis not present

## 2021-12-20 DIAGNOSIS — I4819 Other persistent atrial fibrillation: Secondary | ICD-10-CM | POA: Diagnosis not present

## 2021-12-20 DIAGNOSIS — I10 Essential (primary) hypertension: Secondary | ICD-10-CM

## 2021-12-20 DIAGNOSIS — E1169 Type 2 diabetes mellitus with other specified complication: Secondary | ICD-10-CM

## 2021-12-20 DIAGNOSIS — Z79899 Other long term (current) drug therapy: Secondary | ICD-10-CM | POA: Diagnosis not present

## 2021-12-20 DIAGNOSIS — E1151 Type 2 diabetes mellitus with diabetic peripheral angiopathy without gangrene: Secondary | ICD-10-CM | POA: Diagnosis not present

## 2021-12-20 DIAGNOSIS — I5032 Chronic diastolic (congestive) heart failure: Secondary | ICD-10-CM | POA: Insufficient documentation

## 2021-12-20 DIAGNOSIS — J449 Chronic obstructive pulmonary disease, unspecified: Secondary | ICD-10-CM | POA: Diagnosis not present

## 2021-12-20 DIAGNOSIS — N1832 Chronic kidney disease, stage 3b: Secondary | ICD-10-CM | POA: Insufficient documentation

## 2021-12-20 DIAGNOSIS — I130 Hypertensive heart and chronic kidney disease with heart failure and stage 1 through stage 4 chronic kidney disease, or unspecified chronic kidney disease: Secondary | ICD-10-CM | POA: Diagnosis not present

## 2021-12-20 DIAGNOSIS — Z9884 Bariatric surgery status: Secondary | ICD-10-CM | POA: Insufficient documentation

## 2021-12-20 DIAGNOSIS — I480 Paroxysmal atrial fibrillation: Secondary | ICD-10-CM | POA: Diagnosis not present

## 2021-12-20 DIAGNOSIS — D6869 Other thrombophilia: Secondary | ICD-10-CM | POA: Diagnosis not present

## 2021-12-20 DIAGNOSIS — Z7901 Long term (current) use of anticoagulants: Secondary | ICD-10-CM | POA: Diagnosis not present
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
## 2021-12-21 ENCOUNTER — Ambulatory Visit (HOSPITAL_COMMUNITY): Payer: Medicare Other | Admitting: Physician Assistant

## 2021-12-21 DIAGNOSIS — I4891 Unspecified atrial fibrillation: Secondary | ICD-10-CM | POA: Diagnosis not present

## 2021-12-21 DIAGNOSIS — I5033 Acute on chronic diastolic (congestive) heart failure: Secondary | ICD-10-CM | POA: Diagnosis not present

## 2021-12-21 DIAGNOSIS — E1122 Type 2 diabetes mellitus with diabetic chronic kidney disease: Secondary | ICD-10-CM | POA: Diagnosis not present

## 2021-12-21 DIAGNOSIS — I2510 Atherosclerotic heart disease of native coronary artery without angina pectoris: Secondary | ICD-10-CM | POA: Diagnosis not present

## 2021-12-21 DIAGNOSIS — I130 Hypertensive heart and chronic kidney disease with heart failure and stage 1 through stage 4 chronic kidney disease, or unspecified chronic kidney disease: Secondary | ICD-10-CM | POA: Diagnosis not present

## 2021-12-21 DIAGNOSIS — I21A1 Myocardial infarction type 2: Secondary | ICD-10-CM | POA: Diagnosis not present

## 2021-12-21 LAB — BASIC METABOLIC PANEL
BUN: 36 mg/dL — ABNORMAL HIGH (ref 6–23)
CO2: 36 mEq/L — ABNORMAL HIGH (ref 19–32)
Calcium: 8.7 mg/dL (ref 8.4–10.5)
Chloride: 95 mEq/L — ABNORMAL LOW (ref 96–112)
Creatinine, Ser: 1.64 mg/dL — ABNORMAL HIGH (ref 0.40–1.50)
GFR: 39.5 mL/min — ABNORMAL LOW (ref >60.00)
Glucose, Bld: 207 mg/dL — ABNORMAL HIGH (ref 70–99)
Potassium: 3.8 mEq/L (ref 3.5–5.1)
Sodium: 137 mEq/L (ref 135–145)

## 2021-12-21 LAB — HEMOGLOBIN A1C: Hgb A1c MFr Bld: 6.6 % — ABNORMAL HIGH (ref 4.6–6.5)

## 2021-12-28 ENCOUNTER — Encounter: Payer: Self-pay | Admitting: Endocrinology

## 2021-12-28 ENCOUNTER — Other Ambulatory Visit: Payer: Self-pay

## 2021-12-28 ENCOUNTER — Ambulatory Visit (INDEPENDENT_AMBULATORY_CARE_PROVIDER_SITE_OTHER): Payer: Medicare Other | Admitting: Endocrinology

## 2021-12-28 VITALS — BP 100/60 | HR 84 | Ht 63.0 in | Wt 204.0 lb

## 2021-12-28 DIAGNOSIS — Z794 Long term (current) use of insulin: Secondary | ICD-10-CM

## 2021-12-28 DIAGNOSIS — E1151 Type 2 diabetes mellitus with diabetic peripheral angiopathy without gangrene: Secondary | ICD-10-CM

## 2021-12-28 DIAGNOSIS — E118 Type 2 diabetes mellitus with unspecified complications: Secondary | ICD-10-CM

## 2021-12-28 LAB — POCT GLYCOSYLATED HEMOGLOBIN (HGB A1C): Hemoglobin A1C: 6.5 % — AB (ref 4.0–5.6)
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
## 2021-12-28 NOTE — Patient Instructions (Addendum)
please continue the same Trulicity, insulin, and [place].   check your blood sugar twice a day.  vary the time of day when you check, between before the 3 meals, and at bedtime.  also check if you have symptoms of your blood sugar being too high or too low.  please keep a record of the readings and bring it to your next appointment here (or you can bring the meter itself).  You can write it on any piece of paper.  please call us sooner if your blood sugar goes below 70, or if you have a lot of readings over 200.   Please come back for a follow-up appointment in 3-4 months.

## 2022-01-11 ENCOUNTER — Ambulatory Visit: Payer: Medicare Other | Admitting: Family Medicine

## 2022-01-11 ENCOUNTER — Encounter: Payer: Self-pay | Admitting: Family Medicine

## 2022-01-11 ENCOUNTER — Other Ambulatory Visit: Payer: Self-pay

## 2022-01-11 VITALS — BP 108/66 | HR 89 | Temp 97.4°F | Ht 63.0 in

## 2022-01-11 DIAGNOSIS — H6123 Impacted cerumen, bilateral: Secondary | ICD-10-CM | POA: Diagnosis not present
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
## 2022-01-17 ENCOUNTER — Telehealth: Payer: Self-pay

## 2022-01-17 NOTE — Progress Notes (Signed)
Chronic Care Management APPOINTMENT REMINDER  [name]. was reminded to have all medications, supplements and any blood glucose and blood pressure readings available for review with [name], Pharm. D, at his telephone visit on 01/18/2022 at 3:45 pm .  Patient Confirm Appointment  [place] Pharmacist Assistant [phone]

## 2022-01-18 ENCOUNTER — Ambulatory Visit (INDEPENDENT_AMBULATORY_CARE_PROVIDER_SITE_OTHER): Payer: Medicare Other

## 2022-01-18 DIAGNOSIS — I5032 Chronic diastolic (congestive) heart failure: Secondary | ICD-10-CM

## 2022-01-18 DIAGNOSIS — E1169 Type 2 diabetes mellitus with other specified complication: Secondary | ICD-10-CM

## 2022-01-18 NOTE — Progress Notes (Signed)
Chronic Care Management Pharmacy Note  01/22/2022 Name:  [name]. MRN:  [id number] DOB:  08-Oct-1941  Summary: Patient presents for CCM follow-up. Blood sugars mostly well controlled, with some instance of hypoglycemia due to overcorrecting hyperglycemia.   Recommendations/Changes made from today's visit: -Counseled to avoid dose stacking insulin Continue current medications  Plan: CPP follow-up 6 months  Subjective: [name]. is an 81 y.o. year old male who is a primary patient of Rudd, Lillette Boxer, MD.  The CCM team was consulted for assistance with disease management and care coordination needs.    Engaged with patient by telephone for follow up visit in response to provider referral for pharmacy case management and/or care coordination services.   Consent to Services:  The patient was given information about Chronic Care Management services, agreed to services, and gave verbal consent prior to initiation of services.  Please see initial visit note for detailed documentation.   Patient Care Team: Haydee Salter, MD as PCP - General (Family Medicine) Burnell Blanks, MD as PCP - Cardiology (Cardiology) Burnell Blanks, MD as Consulting Physician (Cardiology) Sharyne Peach, MD as Attending Physician (Ophthalmology) Bensimhon, Shaune Pascal, MD as Attending Physician (Cardiology) Jari Pigg, MD as Attending Physician (Dermatology) Germaine Pomfret, [facility] as Pharmacist (Pharmacist) [name], MD as Consulting Physician (Endocrinology) Burnell Blanks, MD as Consulting Physician (Cardiology) Collene Gobble, MD as Consulting Physician (Pulmonary Disease)  Recent office visits: 01/11/22: Patient presented to Dr. Ethelene Hal for ear lavage. 12/20/21: Patient presented to Dr. Gena Fray for follow-up.  11/22/21: Patient presented to Dr. Gena Fray for hospital follow-up.  04/04/21: Patient presented to Caroleen Hamman, LPN for AWV.    Recent consult  visits: 12/28/21: Patient presented to Dr. Loanne Drilling (Endocrinology). No medication changes made.  12/01/21: Patient presented to Dr. Angelena Form (Cardiology) for follow-up.   11/28/21: Patient presented to Dr. Ronnald Ramp (Nephrology)   Hospital visits: 12/08/21: Patient admitted for cardioversion.   Objective:  Lab Results  Component Value Date   CREATININE 1.64 (H) 12/20/2021   BUN 36 (H) 12/20/2021   GFR 39.50 (L) 12/20/2021   GFRNONAA 45 (L) 12/08/2021   GFRAA 59 (L) 08/20/2017   NA 137 12/20/2021   K 3.8 12/20/2021   CALCIUM 8.7 12/20/2021   CO2 36 (H) 12/20/2021   GLUCOSE 207 (H) 12/20/2021    Lab Results  Component Value Date/Time   HGBA1C 6.5 (A) 12/28/2021 02:18 PM   HGBA1C 6.6 (H) 12/20/2021 03:19 PM   HGBA1C 6.6 (H) 09/20/2021 02:10 PM   HGBA1C 7.1 12/29/2019 12:00 AM   FRUCTOSAMINE 268 08/24/2016 01:50 PM   GFR 39.50 (L) 12/20/2021 03:19 PM   GFR 41.65 (L) 11/22/2021 12:14 PM   MICROALBUR 19.3 (H) 09/20/2021 02:10 PM   MICROALBUR 38.0 (H) 02/10/2015 09:33 AM    Last diabetic Eye exam:  Lab Results  Component Value Date/Time   HMDIABEYEEXA No Retinopathy 08/10/2021 12:00 AM    Last diabetic Foot exam: No results found for: HMDIABFOOTEX   Lab Results  Component Value Date   CHOL 133 09/20/2021   HDL 46.20 09/20/2021   LDLCALC 67 09/20/2021   TRIG 98.0 09/20/2021   CHOLHDL 3 09/20/2021    Hepatic Function Latest Ref Rng & Units 11/08/2021 12/29/2019 08/29/2018  Total Protein 6.5 - 8.1 g/dL 5.7(L) - 5.8(L)  Albumin 3.5 - 5.0 g/dL 2.7(L) 3.0(A) 3.4(L)  AST 15 - 41 U/L 28 8(A) 12  ALT 0 - 44 U/L _0 Alk Phosphatase 38 -  126 U/L 61 - 69  Total Bilirubin 0.3 - 1.2 mg/dL 0.6 - 0.4  Bilirubin, Direct 0.0 - 0.2 mg/dL <0.1 - 0.1    Lab Results  Component Value Date/Time   TSH 2.937 11/07/2021 04:28 PM   TSH 2.38 12/29/2019 12:00 AM   TSH 2.10 08/29/2018 02:34 PM   TSH 2.12 04/24/2017 01:35 PM    CBC Latest Ref Rng & Units 12/08/2021 11/11/2021 11/10/2021   WBC 4.0 - 10.5 K/uL 6.1 9.6 9.9  Hemoglobin 13.0 - 17.0 g/dL 12.0(L) 12.3(L) 13.0  Hematocrit 39.0 - 52.0 % 38.4(L) 38.2(L) 39.5  Platelets 150 - 400 K/uL 172 218 256    Lab Results  Component Value Date/Time   VD25OH 35.80 08/29/2018 02:34 PM   VD25OH 26.40 (L) 04/24/2017 01:35 PM    Clinical ASCVD: Yes  The ASCVD Risk score (Arnett DK, et al., 2019) failed to calculate for the following reasons:   The patient has a prior MI or stroke diagnosis    Depression screen [facility] 2/9 01/11/2022 04/04/2021 03/16/2020  Decreased Interest 0 0 0  Down, Depressed, Hopeless 0 0 0  PHQ - 2 Score 0 0 0  Some recent data might be hidden    Social History   Tobacco Use  Smoking Status Former   Packs/day: 2.00   Years: 41.00   Pack years: 82.00   Types: Cigarettes   Quit date: 12/24/1997   Years since quitting: 24.0  Smokeless Tobacco Never  Tobacco Comments   Former smoker 11/23/21   BP Readings from Last 3 Encounters:  01/11/22 108/66  12/28/21 100/60  12/20/21 128/62   Pulse Readings from Last 3 Encounters:  01/11/22 89  12/28/21 84  12/20/21 87   Wt Readings from Last 3 Encounters:  12/28/21 204 lb (92.5 kg)  12/20/21 205 lb 8 oz (93.2 kg)  12/20/21 205 lb (93 kg)   BMI Readings from Last 3 Encounters:  01/11/22 36.14 kg/m  12/28/21 36.14 kg/m  12/20/21 36.40 kg/m    Assessment/Interventions: Review of patient past medical history, allergies, medications, health status, including review of consultants reports, laboratory and other test data, was performed as part of comprehensive evaluation and provision of chronic care management services.   SDOH:  (Social Determinants of Health) assessments and interventions performed: Yes SDOH Interventions    Flowsheet Row Most Recent Value  SDOH Interventions   Financial Strain Interventions Intervention Not Indicated        SDOH Screenings   Alcohol Screen: Low Risk    Last Alcohol Screening Score (AUDIT): 0  Depression  (PHQ2-9): Low Risk    PHQ-2 Score: 0  Financial Resource Strain: Low Risk    Difficulty of Paying Living Expenses: Not hard at all  Food Insecurity: No Food Insecurity   Worried About Charity fundraiser in the Last Year: Never true   Ran Out of Food in the Last Year: Never true  Housing: Low Risk    Last Housing Risk Score: 0  Physical Activity: Inactive   Days of Exercise per Week: 0 days   Minutes of Exercise per Session: 0 min  Social Connections: Moderately Isolated   Frequency of Communication with Friends and Family: More than three times a week   Frequency of Social Gatherings with Friends and Family: More than three times a week   Attends Religious Services: Never   Marine scientist or Organizations: No   Attends Archivist Meetings: Never   Marital Status: Married  Stress:  No Stress Concern Present   Feeling of Stress : Not at all  Tobacco Use: Medium Risk   Smoking Tobacco Use: Former   Smokeless Tobacco Use: Never   Passive Exposure: Not on file  Transportation Needs: Not on file    CCM Care Plan  Allergies  Allergen Reactions   Enalapril Maleate Cough    REACTION: cough   Lisinopril Cough   Shellfish-Derived Products Swelling    Said occurred twice; has eaten some since and had no reactions   Iodine-Kelp [Iodine] Other (See Comments)    Other reaction(s): Unknown    Medications Reviewed Today     Reviewed by Libby Maw, MD (Physician) on 01/11/22 at [place] List Status: <None>   Medication Order Taking? Sig Documenting Provider Last Dose Status Informant  albuterol (VENTOLIN HFA) 108 (90 Base) MCG/ACT inhaler [id number] Yes Inhale 2 puffs into the lungs every 6 (six) hours as needed for wheezing or shortness of breath. Parrett, Fonnie Mu, NP Taking Active Self  allopurinol (ZYLOPRIM) 300 MG tablet [id number] Yes TAKE 1 TABLET EVERY DAY Rudd, Lillette Boxer, MD Taking Active Self  apixaban (ELIQUIS) 5 MG TABS tablet [id number] Yes Take 1  tablet (5 mg total) by mouth 2 (two) times daily. Haydee Salter, MD Taking Active Self  Azelastine HCl 137 MCG/SPRAY SOLN [id number] Yes INSTILL 2 SPRAYS INTO BOTH NOSTRILS TWICE DAILY AS NEEDED FOR RHINITIS. Collene Gobble, MD Taking Active Self  B Complex Vitamins (VITAMIN B COMPLEX PO) [id number] Yes Take 1 tablet by mouth daily. [provider] Taking Active Self  benzonatate (TESSALON) 100 MG capsule [id number] Yes Take 1 capsule (100 mg total) by mouth 2 (two) times daily as needed for cough. Haydee Salter, MD Taking Active Self  calcitRIOL (ROCALTROL) 0.25 MCG capsule [id number] Yes TAKE 1 CAPSULE EVERY DAY Haydee Salter, MD Taking Active Self  carvedilol (COREG) 25 MG tablet [id number] Yes TAKE 1 TABLET TWICE DAILY WITH MEALS Burnell Blanks, MD Taking Active Self  cetirizine (ZYRTEC) 10 MG tablet [id number] Yes Take 10 mg by mouth as needed for allergies.  [provider] Taking Active Self  cholecalciferol (VITAMIN D3) 25 MCG (1000 UNIT) tablet [id number] Yes Take 1,000 Units by mouth daily. [provider] Taking Active Self  citalopram (CELEXA) 10 MG tablet [id number] Yes TAKE 1 TABLET EVERY DAY Haydee Salter, MD Taking Active Self  clopidogrel (PLAVIX) 75 MG tablet [id number] Yes TAKE 1 TABLET EVERY DAY Burnell Blanks, MD Taking Active Self  dapagliflozin propanediol (FARXIGA) 10 MG TABS tablet [id number] Yes Take 1 tablet (10 mg total) by mouth daily. Patient receives through [employer] patient assistance Rudd, Lillette Boxer, MD Taking Active Self  diphenhydrAMINE (BENADRYL) 25 MG tablet [id number] Yes Take 25 mg by mouth at bedtime. [provider] Taking Active Self  Dulaglutide (TRULICITY) 4.5 OF/7.5ZW SOPN [id number] Yes Inject 4.5 mg as directed once a week. [name], MD Taking Active Self           Med Note Lia Hopping, [place]   Fri Dec 01, 2021  8:31 AM)    ferrous sulfate 324 MG TBEC [id number] Yes Take 324 mg by mouth daily at 2 PM.  [provider] Taking Active Self  finasteride (PROSCAR) 5 MG tablet [id number] Yes TAKE 1 TABLET EVERY DAY Cirigliano, Garvin Fila, DO Taking Active Self  fluticasone-salmeterol (ADVAIR) 250-50 MCG/ACT AEPB [id number] Yes Inhale 1 puff into the lungs 2 (two) times daily. [place] [provider] Taking  Active   GuaiFENesin (MUCINEX MAXIMUM STRENGTH PO) [id number] Yes Take 1 tablet by mouth daily as needed (congestion). [provider] Taking Active Self  guaiFENesin-dextromethorphan (ROBITUSSIN DM) 100-10 MG/5ML syrup [id number] Yes Take 5 mLs by mouth every 6 (six) hours as needed for cough. Arrien, Jimmy Picket, MD Taking Active Self  hydrocortisone 2.5 % cream [id number] Yes Apply 1 application topically. [provider] Taking Active Self  insulin aspart (NOVOLOG) 100 UNIT/ML injection [id number] Yes Inject 7-9 Units into the skin 3 (three) times daily before meals. [name], MD Taking Active Self  isosorbide mononitrate (IMDUR) 60 MG 24 hr tablet [id number] Yes Take 1 tablet (60 mg total) by mouth daily. Haydee Salter, MD Taking Active Self  ketoconazole (NIZORAL) 2 % cream [id number] Yes Apply 1 application topically daily as needed for irritation.  [provider] Taking Active Self  Multiple Vitamins-Minerals (MULTIVITAMIN PO) [id number] Yes Take 1 tablet by mouth daily. [provider] Taking Active Self  nitroGLYCERIN (NITROLINGUAL) 0.4 MG/SPRAY spray [id number] Yes Place 1 spray under the tongue as directed. Burnell Blanks, MD Taking Active Self  nystatin cream (MYCOSTATIN) [id number] Yes Apply 1 application topically 2 (two) times daily. [provider] Taking Active Self  OXYGEN [id number] Yes Inhale 2 L into the lungs at bedtime. [provider] Taking Active   pantoprazole (PROTONIX) 40 MG tablet [id number] Yes TAKE 1 TABLET EVERY DAY Cirigliano, Mary K, DO Taking Active Self  polyethylene glycol powder  (GLYCOLAX/MIRALAX) 17 GM/SCOOP powder [id number] Yes Take 17 g by mouth daily as needed (constipation). [provider] Taking Active Self  Protein (UNJURY UNFLAVORED PO) [id number] Yes Take 8-16 oz by mouth daily. [provider] Taking Active Self  rosuvastatin (CRESTOR) 10 MG tablet [id number] Yes TAKE 1 TABLET EVERY DAY Cirigliano, Mary K, DO Taking Active Self  Tiotropium Bromide Monohydrate (SPIRIVA RESPIMAT) 2.5 MCG/ACT AERS [id number] Yes Inhale 1 puff into the lungs daily. [provider] Taking Active Self  torsemide (DEMADEX) 20 MG tablet [id number] Yes Take 3 tablets (60 mg total) by mouth daily. Burnell Blanks, MD Taking Active Self  traZODone (DESYREL) 100 MG tablet [id number] Yes TAKE 1 TABLET AT BEDTIME [name], DO Taking Active Self            Patient Active Problem List   Diagnosis Date Noted   Ceruminosis, bilateral 01/11/2022   Secondary hypercoagulable state ([place]) 11/23/2021   Type 2 diabetes mellitus with hyperlipidemia ([place]) 11/14/2021   Malnutrition of moderate degree 11/10/2021   Acute renal failure superimposed on stage 3b chronic kidney disease ([place]) 11/07/2021   Increased anion gap metabolic acidosis 38/25/0539   Atrial fibrillation ([place]) 11/07/2021   Chronic kidney disease, stage 3b ([place]) 09/20/2021   History of stroke 09/20/2021   Abnormal x-ray of lung 08/14/2017   Chronic respiratory failure with hypoxia and hypercapnia ([place]) 09/04/2016   COPD with chronic bronchitis ([place]) 08/30/2014   Obesity (BMI 30-39.9) 08/30/2014   NSTEMI (non-ST elevated myocardial infarction) ([place]) 07/17/2013   Elevated troponin 07/16/2013   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   Encounter for long-term (current) use of other medications 11/24/2012   History of laparoscopic adjustable gastric banding 03/20/2012   Depression 12/14/2011   Physical deconditioning 12/14/2011   GERD (gastroesophageal reflux disease) 10/31/2011    Iron deficiency anemia 05/01/2010   History of basal cell carcinoma of skin 10/31/2009   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 07/27/2009  Coronary artery disease 02/02/2009   Chronic diastolic heart failure ([place]) 02/02/2009   Hypokalemia 06/03/2008   Osteoporosis 04/16/2008   Essential hypertension 04/01/2008   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   Peripheral vascular disease ([place]) 09/01/2007   Controlled diabetes mellitus type 2 with complications ([place]) 73/71/0626   Dyslipidemia 05/12/2007   Impotence 05/12/2007   Hemiplegia, 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[place]) 05/12/2007   Allergic rhinitis 05/12/2007   Osteoarthritis 05/12/2007    Immunization History  Administered Date(s) Administered   Fluad Quad(high Dose 65+) 08/25/2019, 10/04/2020, 09/20/2021   Influenza Split 09/14/2011, 09/23/2012, 09/03/2013   Influenza Whole 09/10/2008, 09/23/2009, 08/24/2010   Influenza, High Dose Seasonal PF 09/12/2010, 09/24/2011, 08/24/2012, 08/24/2014, 09/12/2015, 09/24/2015, 09/19/2016, 09/23/2017, 10/28/2017, 08/29/2018   Influenza,inj,Quad PF,6+ Mos 09/13/2014   Influenza-Unspecified 08/26/2009, 09/12/2010, 10/24/2010, 09/09/2019, 09/23/2020, 08/24/2021   Moderna Sars-Covid-2 Vaccination 01/06/2020, 02/03/2020, 08/24/2020   Pneumococcal Conjugate-13 11/11/2014   Pneumococcal Polysaccharide-23 07/24/1996, 07/17/2013, 01/06/2015   Pneumococcal-Unspecified 09/23/2005   Td 02/22/2004   Tdap 12/24/2013, 11/11/2014   Tetanus 10/25/2007   Zoster Recombinat (Shingrix) 12/12/2020, 03/30/2021    Conditions to be addressed/monitored:  Hypertension, Hyperlipidemia, Diabetes, Heart Failure, Coronary Artery Disease, GERD, COPD, Chronic Kidney Disease, Depression, Osteoporosis, Osteoarthritis, BPH, Gout and Allergic Rhinitis  Care Plan : General Pharmacy (Adult)  Updates made by Germaine Pomfret, RPH since  01/22/2022 12:00 AM     Problem: Hypertension, Hyperlipidemia, Diabetes, Heart Failure, Coronary Artery Disease, GERD, COPD, Chronic Kidney Disease, Depression, Osteoporosis, Osteoarthritis, BPH, Gout and Allergic Rhinitis   Priority: High     Long-Range Goal: Patient-Specific Goal   Start Date: 04/12/2021  Expected End Date: 08/22/2022  This Visit's Progress: On track  Recent Progress: On track  Priority: High  Note:   Current Barriers:  Unable to independently afford treatment regimen  Pharmacist Clinical Goal(s):  Patient will verbalize ability to afford treatment regimen maintain control of Heart Failure as evidenced by stable weight, lack of exacerbations  through collaboration with PharmD and provider.   Interventions: 1:1 collaboration with Arlester Marker, MD regarding development and update of comprehensive plan of care as evidenced by provider attestation and co-signature Inter-disciplinary care team collaboration (see longitudinal plan of care) Comprehensive medication review performed; medication list updated in electronic medical record  Heart Failure (Goal: manage symptoms and prevent exacerbations) -Managed by Dr. Angelena Form  -Controlled -Last ejection fraction: 55-60% (Date: Feb 2019) -HF type: Diastolic -NYHA Class: II (slight limitation of activity) -AHA HF Stage: B (Heart disease and symptoms present) -Current treatment: Carvedilol 25 mg twice daily: Appropriate, Effective, Safe, Accessible  Torsemide 20 mg 3 tablets daily: Appropriate, Effective, Safe, Accessible  Isosorbide Mononitrate 60 mg daily: Appropriate, Effective, Safe, Accessible  -Medications previously tried: NA  -Current home BP/HR readings: NA -Current dietary habits: Limiting salt.   -Current exercise habits: Previously going to [employer].  -Educated on Importance of weighing daily; if you gain more than 3 pounds in one day or 5 pounds in one week, contact provider's office -Recommended to continue  current medication  Atrial Fibrillation (Goal: prevent stroke and major bleeding) -Controlled -CHADSVASC: 8 -Current treatment: Rate control: Carvedilol 25 mg twice daily  Anticoagulation: Eliquis 5 mg twice daily  -Medications previously tried: NA -Recommended to continue current medication   Hyperlipidemia: (LDL goal < 70) -Controlled -Current treatment: Rosuvastatin 10 mg daily  -Current antiplatelet treatment:  Clopidogrel 75 mg daily  -Medications previously tried: NA  -Educated on Importance of limiting foods high in cholesterol; -Recommended to continue current medication  Diabetes (  A1c goal <8%) -Controlled -Current medications: Farxiga 10 mg daily: Appropriate, Effective, Safe, Accessible Novolog 7-9 units before meals: Appropriate, Effective, Safe, Accessible   Trulicity 4.5 mg weekly: Appropriate, Effective, Safe, Accessible  -Medications previously tried: Prandin (hypoglycemia, falls) -Current home glucose readings Fasting: 148, 158, 130, 159, 131, 150, 162  -One instance of hypoglycemia. From 315 (missed his mealtime) down to 62 after adminsitering Novolog, 178 -Counseled to avoid dose stacking insulin -Recommended to continue current medication  COPD (Goal: control symptoms and prevent exacerbations) -Controlled -Current treatment  Ventolin HFA 2 puffs every 6 hours as needed  Advair 1 puff twice daily Spiriva Respimat 1 puff daily  -Current allergic rhinitis  treatment  Azelastine Nasal spray 2 spray twice daily as needed  Cetirizine 10 mg daily  Benadryl 25 mg nightly  Guaifenesin daily as needed - Drainage  -Medications previously tried: NA  -Gold Grade: Gold 3 (FEV1 30-49%) -Current COPD Classification:  B (high sx, <2 exacerbations/yr) -MMRC/CAT score: 10  -Pulmonary function testing: FEV1 39% (2013) -Exacerbations requiring treatment in last 6 months: No -Patient reports consistent use of maintenance inhaler -Frequency of rescue inhaler use:  few times monthly -Counseled on Benefits of consistent maintenance inhaler use When to use rescue inhaler -Recommended to continue current medication  Depression/Insomnia (Goal: Maintain stable mood/sleep) -Controlled -Current treatment: Citalopram 10 mg daily  Trazodone 100 mg nightly  -Medications previously tried/failed: NA -PHQ9: 0 -GAD7: NA - A couple of nights a year he has a hard time falling asleep. He attributes this to barimetric changes in the weather. Overall feels he has good sleep quality, sleeps soundly through the night. He tends to sleep from 3a-11a due to caring for his daughter who suffers from [place].  -Educated on Benefits of medication for symptom control  -Counseled on proper sleep hygiene -Recommended to continue current medication  Gout (Goal: Prevent gout flares) -Controlled -Current treatment  Allopurinol 300 mg daily  -Medications previously tried: NA -Last gout flare: 2021  -Counseled patient on low-purine diet -Recommended to continue current medication  Patient Goals/Self-Care Activities Patient will:  - check glucose before meals and at bedtime, document, and provide at future appointments check blood pressure 2-3 times weekly, document, and provide at future appointments weigh daily, and contact provider if weight gain of greater than 3 pounds in one day  Follow Up Plan: Telephone follow up appointment with care management team member scheduled for:  04/12/2022 at 3:45 PM    Medication Assistance:  Wilder Glade obtained through [employer] medication assistance program.  Enrollment ends Dec 8682 Trulicity obtained through [employer] medication assistance program.  Enrollment ends Dec 2022  Patient's preferred pharmacy is:  [facility] [address] AT [place] [address] Phone: [phone] Fax: [phone]   Uses pill box? Yes Pt endorses 100%  compliance  We discussed: Current pharmacy is preferred with insurance plan and patient is satisfied with pharmacy services Patient decided to: Continue current medication management strategy  Care Plan and Follow Up Patient Decision:  Patient agrees to Care Plan and Follow-up.  Plan: Telephone follow up appointment with care management team member scheduled for:  04/12/2022 at 3:45 PM  Junius Argyle, PharmD, Para [place], CPP Clinical Pharmacist Practitioner  [facility]  [phone]

## 2022-01-19 ENCOUNTER — Telehealth: Payer: Self-pay

## 2022-01-19 NOTE — Progress Notes (Signed)
Per Clinical Pharmacist, can you give [facility] a call and see about helping get him his next delivery of Farxiga 10 mg tablets? He only has about 8 days left through his AZ&ME program   I reach out to [facility] to check on the delivery for Farxiga. Per [facility], I need to reach out to [employer] and speak to there customer services since the patient receives assistance through them.[facility] provided contact number to call [phone].  Per [employer], they will go ahead and run the prescription while I was on the phone , and it can take 10-14 days to be deliver.Per [employer], she will email the pharmacy to see if they can expedited since he only  has 8 days left.Patient will either received a text or email letting him know if the pharmacy is able to expedite or not.Notified Clinical pharmacist.    Informed patient of the above, and patient verbalized understanding.  [place] Pharmacist Assistant [phone]

## 2022-01-22 NOTE — Patient Instructions (Signed)
Visit Information It was great speaking with you today!  Please let me know if you have any questions about our visit.   Goals Addressed             This Visit's Progress    Track and Manage Fluids and Swelling-Heart Failure   On track    Timeframe:  Long-Range Goal Priority:  High Start Date:  04/12/2021                           Expected End Date:  10/12/2022                     Follow Up within 90 days   - call office if I gain more than 2 pounds in one day or 5 pounds in one week - keep legs up while sitting - use salt in moderation - watch for swelling in feet, ankles and legs every day - weigh myself daily    Why is this important?   It is important to check your weight daily and watch how much salt and liquids you have.  It will help you to manage your heart failure.    Notes:         Patient Care Plan: General Pharmacy (Adult)     Problem Identified: Hypertension, Hyperlipidemia, Diabetes, Heart Failure, Coronary Artery Disease, GERD, COPD, Chronic Kidney Disease, Depression, Osteoporosis, Osteoarthritis, BPH, Gout and Allergic Rhinitis   Priority: High     Long-Range Goal: Patient-Specific Goal   Start Date: 04/12/2021  Expected End Date: 08/22/2022  This Visit's Progress: On track  Recent Progress: On track  Priority: High  Note:   Current Barriers:  Unable to independently afford treatment regimen  Pharmacist Clinical Goal(s):  Patient will verbalize ability to afford treatment regimen maintain control of Heart Failure as evidenced by stable weight, lack of exacerbations  through collaboration with PharmD and provider.   Interventions: 1:1 collaboration with Arlester Marker, MD regarding development and update of comprehensive plan of care as evidenced by provider attestation and co-signature Inter-disciplinary care team collaboration (see longitudinal plan of care) Comprehensive medication review performed; medication list updated in electronic medical  record  Heart Failure (Goal: manage symptoms and prevent exacerbations) -Managed by Dr. Angelena Form  -Controlled -Last ejection fraction: 55-60% (Date: Feb 2019) -HF type: Diastolic -NYHA Class: II (slight limitation of activity) -AHA HF Stage: B (Heart disease and symptoms present) -Current treatment: Carvedilol 25 mg twice daily: Appropriate, Effective, Safe, Accessible  Torsemide 20 mg 3 tablets daily: Appropriate, Effective, Safe, Accessible  Isosorbide Mononitrate 60 mg daily: Appropriate, Effective, Safe, Accessible  -Medications previously tried: NA  -Current home BP/HR readings: NA -Current dietary habits: Limiting salt.   -Current exercise habits: Previously going to [employer].  -Educated on Importance of weighing daily; if you gain more than 3 pounds in one day or 5 pounds in one week, contact provider's office -Recommended to continue current medication  Atrial Fibrillation (Goal: prevent stroke and major bleeding) -Controlled -CHADSVASC: 8 -Current treatment: Rate control: Carvedilol 25 mg twice daily  Anticoagulation: Eliquis 5 mg twice daily  -Medications previously tried: NA -Recommended to continue current medication   Hyperlipidemia: (LDL goal < 70) -Controlled -Current treatment: Rosuvastatin 10 mg daily  -Current antiplatelet treatment:  Clopidogrel 75 mg daily  -Medications previously tried: NA  -Educated on Importance of limiting foods high in cholesterol; -Recommended to continue current medication  Diabetes (A1c goal <8%) -Controlled -Current medications: [place]  10 mg daily: Appropriate, Effective, Safe, Accessible Novolog 7-9 units before meals: Appropriate, Effective, Safe, Accessible   Trulicity 4.5 mg weekly: Appropriate, Effective, Safe, Accessible  -Medications previously tried: Prandin (hypoglycemia, falls) -Current home glucose readings Fasting: 148, 158, 130, 159, 131, 150, 162  -One instance of hypoglycemia. From 315 (missed his mealtime)  down to 62 after adminsitering Novolog, 178 -Counseled to avoid dose stacking insulin -Recommended to continue current medication  COPD (Goal: control symptoms and prevent exacerbations) -Controlled -Current treatment  Ventolin HFA 2 puffs every 6 hours as needed  Advair 1 puff twice daily Spiriva Respimat 1 puff daily  -Current allergic rhinitis  treatment  Azelastine Nasal spray 2 spray twice daily as needed  Cetirizine 10 mg daily  Benadryl 25 mg nightly  Guaifenesin daily as needed - Drainage  -Medications previously tried: NA  -Gold Grade: Gold 3 (FEV1 30-49%) -Current COPD Classification:  B (high sx, <2 exacerbations/yr) -MMRC/CAT score: 10  -Pulmonary function testing: FEV1 39% (2013) -Exacerbations requiring treatment in last 6 months: No -Patient reports consistent use of maintenance inhaler -Frequency of rescue inhaler use: few times monthly -Counseled on Benefits of consistent maintenance inhaler use When to use rescue inhaler -Recommended to continue current medication  Depression/Insomnia (Goal: Maintain stable mood/sleep) -Controlled -Current treatment: Citalopram 10 mg daily  Trazodone 100 mg nightly  -Medications previously tried/failed: NA -PHQ9: 0 -GAD7: NA - A couple of nights a year he has a hard time falling asleep. He attributes this to barimetric changes in the weather. Overall feels he has good sleep quality, sleeps soundly through the night. He tends to sleep from 3a-11a due to caring for his daughter who suffers from [place].  -Educated on Benefits of medication for symptom control  -Counseled on proper sleep hygiene -Recommended to continue current medication  Gout (Goal: Prevent gout flares) -Controlled -Current treatment  Allopurinol 300 mg daily  -Medications previously tried: NA -Last gout flare: 2021  -Counseled patient on low-purine diet -Recommended to continue current medication  Patient Goals/Self-Care Activities Patient will:  -  check glucose before meals and at bedtime, document, and provide at future appointments check blood pressure 2-3 times weekly, document, and provide at future appointments weigh daily, and contact provider if weight gain of greater than 3 pounds in one day  Follow Up Plan: Telephone follow up appointment with care management team member scheduled for:  04/12/2022 at 3:45 PM      Patient agreed to services and verbal consent obtained.   Patient verbalizes understanding of instructions and care plan provided today and agrees to view in [place]. Active MyChart status confirmed with patient.    Junius Argyle, PharmD, Para [place], CPP Clinical Pharmacist Practitioner  [facility]  [phone]

## 2022-01-23 DIAGNOSIS — E785 Hyperlipidemia, unspecified: Secondary | ICD-10-CM

## 2022-01-23 DIAGNOSIS — E1169 Type 2 diabetes mellitus with other specified complication: Secondary | ICD-10-CM

## 2022-01-23 DIAGNOSIS — I5032 Chronic diastolic (congestive) heart failure: Secondary | ICD-10-CM

## 2022-01-29 ENCOUNTER — Other Ambulatory Visit: Payer: Self-pay

## 2022-01-29 ENCOUNTER — Telehealth: Payer: Self-pay

## 2022-01-29 MED ORDER — ROSUVASTATIN CALCIUM 10 MG PO TABS: 10.0000 mg | ORAL_TABLET | Freq: Every day | ORAL | 3 refills | Status: DC

## 2022-01-29 NOTE — Progress Notes (Signed)
Chronic Care Management Pharmacy Assistant   Name: [name].  MRN: [id number] DOB: 02-18-1941  Reason for Encounter:Diabetes Disease State Call.   Recent office visits:  No recent office visit  Recent consult visits:  No recent consult visit  Hospital visits:  None in previous 6 months  Medications: Outpatient Encounter Medications as of 01/29/2022  Medication Sig   albuterol (VENTOLIN HFA) 108 (90 Base) MCG/ACT inhaler Inhale 2 puffs into the lungs every 6 (six) hours as needed for wheezing or shortness of breath.   allopurinol (ZYLOPRIM) 300 MG tablet TAKE 1 TABLET EVERY DAY   apixaban (ELIQUIS) 5 MG TABS tablet Take 1 tablet (5 mg total) by mouth 2 (two) times daily.   Azelastine HCl 137 MCG/SPRAY SOLN INSTILL 2 SPRAYS INTO BOTH NOSTRILS TWICE DAILY AS NEEDED FOR RHINITIS.   B Complex Vitamins (VITAMIN B COMPLEX PO) Take 1 tablet by mouth daily.   benzonatate (TESSALON) 100 MG capsule Take 1 capsule (100 mg total) by mouth 2 (two) times daily as needed for cough.   calcitRIOL (ROCALTROL) 0.25 MCG capsule TAKE 1 CAPSULE EVERY DAY   carvedilol (COREG) 25 MG tablet TAKE 1 TABLET TWICE DAILY WITH MEALS   cetirizine (ZYRTEC) 10 MG tablet Take 10 mg by mouth as needed for allergies.    cholecalciferol (VITAMIN D3) 25 MCG (1000 UNIT) tablet Take 1,000 Units by mouth daily.   citalopram (CELEXA) 10 MG tablet TAKE 1 TABLET EVERY DAY   clopidogrel (PLAVIX) 75 MG tablet TAKE 1 TABLET EVERY DAY   dapagliflozin propanediol (FARXIGA) 10 MG TABS tablet Take 1 tablet (10 mg total) by mouth daily. Patient receives through [place] patient assistance   diphenhydrAMINE (BENADRYL) 25 MG tablet Take 25 mg by mouth at bedtime.   Dulaglutide (TRULICITY) 4.5 HY/8.5OY SOPN Inject 4.5 mg as directed once a week.   ferrous sulfate 324 MG TBEC Take 324 mg by mouth daily at 2 PM.   finasteride (PROSCAR) 5 MG tablet TAKE 1 TABLET EVERY DAY   fluticasone-salmeterol (ADVAIR) 250-50 MCG/ACT AEPB Inhale  1 puff into the lungs 2 (two) times daily. [place]   GuaiFENesin (MUCINEX MAXIMUM STRENGTH PO) Take 1 tablet by mouth daily as needed (congestion).   guaiFENesin-dextromethorphan (ROBITUSSIN DM) 100-10 MG/5ML syrup Take 5 mLs by mouth every 6 (six) hours as needed for cough.   hydrocortisone 2.5 % cream Apply 1 application topically.   insulin aspart (NOVOLOG) 100 UNIT/ML injection Inject 7-9 Units into the skin 3 (three) times daily before meals.   isosorbide mononitrate (IMDUR) 60 MG 24 hr tablet Take 1 tablet (60 mg total) by mouth daily.   ketoconazole (NIZORAL) 2 % cream Apply 1 application topically daily as needed for irritation.    Multiple Vitamins-Minerals (MULTIVITAMIN PO) Take 1 tablet by mouth daily.   nitroGLYCERIN (NITROLINGUAL) 0.4 MG/SPRAY spray Place 1 spray under the tongue as directed.   nystatin cream (MYCOSTATIN) Apply 1 application topically 2 (two) times daily.   OXYGEN Inhale 2 L into the lungs at bedtime.   pantoprazole (PROTONIX) 40 MG tablet TAKE 1 TABLET EVERY DAY   polyethylene glycol powder (GLYCOLAX/MIRALAX) 17 GM/SCOOP powder Take 17 g by mouth daily as needed (constipation).   Protein (UNJURY UNFLAVORED PO) Take 8-16 oz by mouth daily.   rosuvastatin (CRESTOR) 10 MG tablet TAKE 1 TABLET EVERY DAY   Tiotropium Bromide Monohydrate (SPIRIVA RESPIMAT) 2.5 MCG/ACT AERS Inhale 1 puff into the lungs daily.   torsemide (DEMADEX) 20 MG tablet Take 3 tablets (60  mg total) by mouth daily.   traZODone (DESYREL) 100 MG tablet TAKE 1 TABLET AT BEDTIME   No facility-administered encounter medications on file as of 01/29/2022.    Care Gaps: Hepatitis C Screening COVID-19 Vaccine (4- Booster for Lenord Fellers) Star Rating Drug: 12/02/2021 Rosuvastatin 10 mg last filled for 90-Day supply (No Pharmacy on noted) 07/86/7544 Trulicity 4.5 mg last filled for a 28- Day Supply  at [facility] ([facility]) [place] 5 mg covered under patient assistance through 2022. Medication Fill  Gaps: None ID  Recent Relevant Labs: Lab Results  Component Value Date/Time   HGBA1C 6.5 (A) 12/28/2021 02:18 PM   HGBA1C 6.6 (H) 12/20/2021 03:19 PM   HGBA1C 6.6 (H) 09/20/2021 02:10 PM   HGBA1C 7.1 12/29/2019 12:00 AM   MICROALBUR 19.3 (H) 09/20/2021 02:10 PM   MICROALBUR 38.0 (H) 02/10/2015 09:33 AM    Kidney Function Lab Results  Component Value Date/Time   CREATININE 1.64 (H) 12/20/2021 03:19 PM   CREATININE 1.57 (H) 12/08/2021 09:00 AM   CREATININE 1.35 05/10/2014 01:46 PM   GFR 39.50 (L) 12/20/2021 03:19 PM   GFRNONAA 45 (L) 12/08/2021 09:00 AM   GFRNONAA 52 (L) 05/10/2014 01:46 PM   GFRAA 59 (L) 08/20/2017 03:42 PM   GFRAA 60 05/10/2014 01:46 PM    Current antihyperglycemic regimen:  Farxiga 10 mg daily: Appropriate, Effective, Safe, Accessible Novolog 7-9 units before meals: Appropriate, Effective, Safe, Accessible   Trulicity 4.5 mg weekly: Appropriate, Effective, Safe, Accessible   What recent interventions/DTPs have been made to improve glycemic control:  None ID Have there been any recent hospitalizations or ED visits since last visit with CPP? No  Patient denies hypoglycemic symptoms, including Pale, Sweaty, Shaky, Hungry, Nervous/irritable, and Vision changes  Patient denies hyperglycemic symptoms, including blurry vision, excessive thirst, fatigue, polyuria, and weakness  How often are you checking your blood sugar? 3-4 times daily  What are your blood sugars ranging?  On 02/01/2022   1:59 pm it was 159  3:00 pm it was 161  On 01/31/2022 1:00 pm it was 172 6:30 pm it was 117 12:00 am it was 179  On 01/30/2022 1:30 pm it was 178 2:30 pm it was 188 7:30 pm it was 127 11:00 pm it was 148  On 01/29/2022 1:00 pm it was 165 2:30 pm it was 195 8:00 pm it was 191 12:30 am it was 268 (patient states he had eaten popcorn before checking his blood sugar)   During the week, how often does your blood glucose drop below 70? Never Are you checking  your feet daily/regularly?   Yes, patient denies any changes with his feet at this time.  Patient confirmed he did receive Farxiga from [place].  Adherence Review: Is the patient currently on a STATIN medication? Yes Is the patient currently on ACE/ARB medication? No Does the patient have >5 day gap between last estimated fill dates? No  Telephone follow up appointment with care management team member scheduled for:  04/12/2022 at 3:45 PM  [place] Pharmacist Assistant [phone]

## 2022-02-01 ENCOUNTER — Other Ambulatory Visit: Payer: Self-pay

## 2022-02-01 MED ORDER — ROSUVASTATIN CALCIUM 10 MG PO TABS: 10.0000 mg | ORAL_TABLET | Freq: Every day | ORAL | 3 refills | Status: DC

## 2022-02-01 NOTE — Telephone Encounter (Signed)
Rx resent to [facility] due to insurance change and using them now instead of [place] mail order. Patient confirmed information.  Dm/cma

## 2022-02-07 ENCOUNTER — Other Ambulatory Visit: Payer: Self-pay

## 2022-02-07 ENCOUNTER — Ambulatory Visit: Payer: Medicare Other | Admitting: Cardiovascular Disease

## 2022-02-07 ENCOUNTER — Encounter: Payer: Self-pay | Admitting: Cardiovascular Disease

## 2022-02-07 VITALS — BP 114/70 | HR 84 | Ht 63.0 in | Wt 205.5 lb

## 2022-02-07 DIAGNOSIS — I5032 Chronic diastolic (congestive) heart failure: Secondary | ICD-10-CM | POA: Diagnosis not present

## 2022-02-07 DIAGNOSIS — I4819 Other persistent atrial fibrillation: Secondary | ICD-10-CM

## 2022-02-07 DIAGNOSIS — I10 Essential (primary) hypertension: Secondary | ICD-10-CM

## 2022-02-07 DIAGNOSIS — I2510 Atherosclerotic heart disease of native coronary artery without angina pectoris: Secondary | ICD-10-CM | POA: Diagnosis not present

## 2022-02-07 DIAGNOSIS — I255 Ischemic cardiomyopathy: Secondary | ICD-10-CM | POA: Diagnosis not present

## 2022-02-07 DIAGNOSIS — E7800 Pure hypercholesterolemia, unspecified: Secondary | ICD-10-CM

## 2022-02-07 NOTE — Patient Instructions (Addendum)
Medication Instructions:  No changes *If you need a refill on your cardiac medications before your next appointment, please call your pharmacy*   Lab Work: none If you have labs (blood work) drawn today and your tests are completely normal, you will receive your results only by: [place] (if you have MyChart) OR A paper copy in the mail If you have any lab test that is abnormal or we need to change your treatment, we will call you to review the results.   Testing/Procedures: none   Follow-Up: At [facility], you and your health needs are our priority.  As part of our continuing mission to provide you with exceptional heart care, we have created designated Provider Care Teams.  These Care Teams include your primary Cardiologist (physician) and Advanced Practice Providers (APPs -  Physician Assistants and Nurse Practitioners) who all work together to provide you with the care you need, when you need it.   Your next appointment:   6 month(s)  The format for your next appointment:   In Person  Provider:   Lauree Chandler, MD     Addendum: pt would like to get back into cardiac rehab.  Was seen by cardiac rehab in Dec and felt to be not appropriate for cardiac rehab at this time.  Will ask pt to check w PCP re: physical therapy.
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
## 2022-02-26 ENCOUNTER — Telehealth: Payer: Medicare Other

## 2022-03-20 ENCOUNTER — Encounter: Payer: Self-pay | Admitting: Family Medicine

## 2022-03-20 ENCOUNTER — Ambulatory Visit: Payer: Medicare Other | Admitting: Family Medicine

## 2022-03-20 ENCOUNTER — Other Ambulatory Visit: Payer: Self-pay

## 2022-03-20 VITALS — BP 106/58 | HR 97 | Temp 97.3°F | Ht 63.0 in | Wt 206.0 lb

## 2022-03-20 DIAGNOSIS — J4489 Other specified chronic obstructive pulmonary disease: Secondary | ICD-10-CM

## 2022-03-20 DIAGNOSIS — N2581 Secondary hyperparathyroidism of renal origin: Secondary | ICD-10-CM

## 2022-03-20 DIAGNOSIS — I10 Essential (primary) hypertension: Secondary | ICD-10-CM

## 2022-03-20 DIAGNOSIS — J9612 Chronic respiratory failure with hypercapnia: Secondary | ICD-10-CM

## 2022-03-20 DIAGNOSIS — F5101 Primary insomnia: Secondary | ICD-10-CM

## 2022-03-20 DIAGNOSIS — K219 Gastro-esophageal reflux disease without esophagitis: Secondary | ICD-10-CM | POA: Diagnosis not present

## 2022-03-20 DIAGNOSIS — N401 Enlarged prostate with lower urinary tract symptoms: Secondary | ICD-10-CM

## 2022-03-20 DIAGNOSIS — M1A9XX0 Chronic gout, unspecified, without tophus (tophi): Secondary | ICD-10-CM

## 2022-03-20 DIAGNOSIS — E118 Type 2 diabetes mellitus with unspecified complications: Secondary | ICD-10-CM

## 2022-03-20 DIAGNOSIS — F32A Depression, unspecified: Secondary | ICD-10-CM | POA: Diagnosis not present

## 2022-03-20 DIAGNOSIS — I5032 Chronic diastolic (congestive) heart failure: Secondary | ICD-10-CM

## 2022-03-20 DIAGNOSIS — J449 Chronic obstructive pulmonary disease, unspecified: Secondary | ICD-10-CM

## 2022-03-20 DIAGNOSIS — N1832 Chronic kidney disease, stage 3b: Secondary | ICD-10-CM

## 2022-03-20 DIAGNOSIS — N139 Obstructive and reflux uropathy, unspecified: Secondary | ICD-10-CM

## 2022-03-20 DIAGNOSIS — I2510 Atherosclerotic heart disease of native coronary artery without angina pectoris: Secondary | ICD-10-CM | POA: Diagnosis not present

## 2022-03-20 DIAGNOSIS — J9611 Chronic respiratory failure with hypoxia: Secondary | ICD-10-CM

## 2022-03-20 MED ORDER — CITALOPRAM HYDROBROMIDE 10 MG PO TABS: ORAL_TABLET | ORAL | 3 refills | Status: DC

## 2022-03-20 MED ORDER — ALLOPURINOL 300 MG PO TABS: ORAL_TABLET | ORAL | 3 refills | Status: DC

## 2022-03-20 MED ORDER — PANTOPRAZOLE SODIUM 40 MG PO TBEC: 40.0000 mg | DELAYED_RELEASE_TABLET | Freq: Every day | ORAL | 3 refills | Status: DC

## 2022-03-20 MED ORDER — CALCITRIOL 0.25 MCG PO CAPS: 0.2500 ug | ORAL_CAPSULE | Freq: Every day | ORAL | 3 refills | Status: DC

## 2022-03-20 MED ORDER — ISOSORBIDE MONONITRATE ER 60 MG PO TB24: 60.0000 mg | ORAL_TABLET | Freq: Every day | ORAL | 3 refills | Status: DC

## 2022-03-20 MED ORDER — TRAZODONE HCL 100 MG PO TABS: ORAL_TABLET | ORAL | 3 refills | Status: DC

## 2022-03-20 MED ORDER — TORSEMIDE 20 MG PO TABS: 60.0000 mg | ORAL_TABLET | Freq: Every day | ORAL | 11 refills | Status: DC

## 2022-03-20 MED ORDER — CARVEDILOL 25 MG PO TABS: 25.0000 mg | ORAL_TABLET | Freq: Two times a day (BID) | ORAL | 3 refills | Status: DC

## 2022-03-20 MED ORDER — FINASTERIDE 5 MG PO TABS: 5.0000 mg | ORAL_TABLET | Freq: Every day | ORAL | 3 refills | Status: DC
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
## 2022-03-23 ENCOUNTER — Encounter: Payer: Self-pay | Admitting: Family Medicine

## 2022-03-28 ENCOUNTER — Other Ambulatory Visit: Payer: Self-pay | Admitting: Family Medicine

## 2022-03-28 DIAGNOSIS — N139 Obstructive and reflux uropathy, unspecified: Secondary | ICD-10-CM

## 2022-03-28 MED ORDER — FINASTERIDE 5 MG PO TABS: 5.0000 mg | ORAL_TABLET | Freq: Every day | ORAL | 3 refills | Status: DC

## 2022-03-28 NOTE — Progress Notes (Signed)
Proscar note available through mail order pharmacy. Patient prefers to receive same medicine through a  local pharmacy rather than substitute Avodart. ? ?Haydee Salter, MD ?

## 2022-04-10 ENCOUNTER — Ambulatory Visit: Payer: Medicare Other

## 2022-04-11 ENCOUNTER — Telehealth: Payer: Self-pay

## 2022-04-11 NOTE — Progress Notes (Signed)
Chronic Care Management  APPOINTMENT REMINDER ? ? ?Reco Shonk. was reminded to have all medications, supplements and any blood glucose and blood pressure readings available for review with Junius Argyle, Pharm. D, at his telephone visit on 04/12/2022 at 3:45 pm. ? ? ?Patient confirm appointment. ? ?Anderson Malta ?Clinical Pharmacist Assistant ?[phone]  ? ?

## 2022-04-12 ENCOUNTER — Ambulatory Visit (INDEPENDENT_AMBULATORY_CARE_PROVIDER_SITE_OTHER): Payer: Medicare Other

## 2022-04-12 DIAGNOSIS — E1169 Type 2 diabetes mellitus with other specified complication: Secondary | ICD-10-CM

## 2022-04-12 DIAGNOSIS — J449 Chronic obstructive pulmonary disease, unspecified: Secondary | ICD-10-CM

## 2022-04-12 NOTE — Progress Notes (Signed)
? ?Chronic Care Management ?Pharmacy Note ? ?04/24/2022 ?Name:  [name]. MRN:  [id number] DOB:  1941-10-20 ? ?Summary: ?Patient presents for CCM follow-up.  ? ?Recommendations/Changes made from today's visit: ?Continue current medications ? ?Plan: ?CPP follow-up 6 months ? ?Subjective: ?[name]. is an 81 y.o. year old male who is a primary patient of Rudd, Lillette Boxer, MD.  The CCM team was consulted for assistance with disease management and care coordination needs.   ? ?Engaged with patient by telephone for follow up visit in response to provider referral for pharmacy case management and/or care coordination services.  ? ?Consent to Services:  ?The patient was given information about Chronic Care Management services, agreed to services, and gave verbal consent prior to initiation of services.  Please see initial visit note for detailed documentation.  ? ?Patient Care Team: ?Haydee Salter, MD as PCP - General (Family Medicine) ?Burnell Blanks, MD as PCP - Cardiology (Cardiology) ?Burnell Blanks, MD as Consulting Physician (Cardiology) ?Sharyne Peach, MD as Attending Physician (Ophthalmology) ?Bensimhon, Shaune Pascal, MD as Attending Physician (Cardiology) ?Jari Pigg, MD as Attending Physician (Dermatology) ?Germaine Pomfret, [facility] as Pharmacist (Pharmacist) ?[name], MD as Consulting Physician (Endocrinology) ?Burnell Blanks, MD as Consulting Physician (Cardiology) ?Collene Gobble, MD as Consulting Physician (Pulmonary Disease) ? ?Recent office visits: ?03/20/22: Patient presented to Dr. Gena Fray for follow-up. BP 106/58, stopped clopidogrel.  ?01/11/22: Patient presented to Dr. Ethelene Hal for ear lavage. ?12/20/21: Patient presented to Dr. Gena Fray for follow-up.  ?11/22/21: Patient presented to Dr. Gena Fray for hospital follow-up.  ?04/04/21: Patient presented to Caroleen Hamman, LPN for AWV.  ? ? ?Recent consult visits: ?02/07/22: Patient presented to Dr. Angelena Form (Cardiology) for  follow-up.  ? ?12/28/21: Patient presented to Dr. Loanne Drilling (Endocrinology). No medication changes made. ? ?12/01/21: Patient presented to Dr. Angelena Form (Cardiology) for follow-up.  ? ?11/28/21: Patient presented to Dr. Ronnald Ramp (Nephrology)  ? ?Hospital visits: ?12/08/21: Patient admitted for cardioversion.  ? ?Objective: ? ?Lab Results  ?Component Value Date  ? CREATININE 1.64 (H) 12/20/2021  ? BUN 36 (H) 12/20/2021  ? GFR 39.50 (L) 12/20/2021  ? GFRNONAA 45 (L) 12/08/2021  ? GFRAA 59 (L) 08/20/2017  ? NA 137 12/20/2021  ? K 3.8 12/20/2021  ? CALCIUM 8.7 12/20/2021  ? CO2 36 (H) 12/20/2021  ? GLUCOSE 207 (H) 12/20/2021  ? ? ?Lab Results  ?Component Value Date/Time  ? HGBA1C 6.5 (A) 12/28/2021 02:18 PM  ? HGBA1C 6.6 (H) 12/20/2021 03:19 PM  ? HGBA1C 6.6 (H) 09/20/2021 02:10 PM  ? HGBA1C 7.1 12/29/2019 12:00 AM  ? FRUCTOSAMINE 268 08/24/2016 01:50 PM  ? GFR 39.50 (L) 12/20/2021 03:19 PM  ? GFR 41.65 (L) 11/22/2021 12:14 PM  ? MICROALBUR 19.3 (H) 09/20/2021 02:10 PM  ? MICROALBUR 38.0 (H) 02/10/2015 09:33 AM  ?  ?Last diabetic Eye exam:  ?Lab Results  ?Component Value Date/Time  ? HMDIABEYEEXA No Retinopathy 08/10/2021 12:00 AM  ?  ?Last diabetic Foot exam: No results found for: HMDIABFOOTEX  ? ?Lab Results  ?Component Value Date  ? CHOL 133 09/20/2021  ? HDL 46.20 09/20/2021  ? [place] 67 09/20/2021  ? TRIG 98.0 09/20/2021  ? CHOLHDL 3 09/20/2021  ? ? ? ?  Latest Ref Rng & Units 11/08/2021  ?  3:39 AM 12/29/2019  ? 12:00 AM 08/29/2018  ?  2:34 PM  ?Hepatic Function  ?Total Protein 6.5 - 8.1 g/dL 5.7    5.8    ?Albumin 3.5 - 5.0  g/dL 2.7   3.0      3.4    ?AST 15 - 41 U/L '28   8      12    ' ?ALT 0 - 44 U/L '24   21      13    ' ?Alk Phosphatase 38 - 126 U/L 61    69    ?Total Bilirubin 0.3 - 1.2 mg/dL 0.6    0.4    ?Bilirubin, Direct 0.0 - 0.2 mg/dL <0.1    0.1    ?  ? This result is from an external source.  ? ? ?Lab Results  ?Component Value Date/Time  ? TSH 2.937 11/07/2021 04:28 PM  ? TSH 2.38 12/29/2019 12:00 AM  ? TSH 2.10  08/29/2018 02:34 PM  ? TSH 2.12 04/24/2017 01:35 PM  ? ? ? ?  Latest Ref Rng & Units 12/08/2021  ?  9:00 AM 11/11/2021  ?  8:07 AM 11/10/2021  ?  1:55 AM  ?CBC  ?WBC 4.0 - 10.5 K/uL 6.1   9.6   9.9    ?Hemoglobin 13.0 - 17.0 g/dL 12.0   12.3   13.0    ?Hematocrit 39.0 - 52.0 % 38.4   38.2   39.5    ?Platelets 150 - 400 K/uL 172   218   256    ? ? ?Lab Results  ?Component Value Date/Time  ? VD25OH 35.80 08/29/2018 02:34 PM  ? VD25OH 26.40 (L) 04/24/2017 01:35 PM  ? ? ?Clinical ASCVD: Yes  ?The ASCVD Risk score (Arnett DK, et al., 2019) failed to calculate for the following reasons: ?  The patient has a prior MI or stroke diagnosis   ? ? ?  04/18/2022  ?  3:40 PM 04/18/2022  ?  3:38 PM 01/11/2022  ?  1:11 PM  ?Depression screen PHQ 2/9  ?Decreased Interest 0 0 0  ?Down, Depressed, Hopeless 0 0 0  ?PHQ - 2 Score 0 0 0  ?  ?Social History  ? ?Tobacco Use  ?Smoking Status Former  ? Packs/day: 2.00  ? Years: 41.00  ? Pack years: 82.00  ? Types: Cigarettes  ? Quit date: 12/24/1997  ? Years since quitting: 24.3  ?Smokeless Tobacco Never  ?Tobacco Comments  ? Former smoker 11/23/21  ? ?BP Readings from Last 3 Encounters:  ?03/20/22 (!) 106/58  ?02/07/22 114/70  ?01/11/22 108/66  ? ?Pulse Readings from Last 3 Encounters:  ?03/20/22 97  ?02/07/22 84  ?01/11/22 89  ? ?Wt Readings from Last 3 Encounters:  ?03/20/22 206 lb (93.4 kg)  ?02/07/22 205 lb 8 oz (93.2 kg)  ?12/28/21 204 lb (92.5 kg)  ? ?BMI Readings from Last 3 Encounters:  ?03/20/22 36.49 kg/m?  ?02/07/22 36.40 kg/m?  ?01/11/22 36.14 kg/m?  ? ? ?Assessment/Interventions: Review of patient past medical history, allergies, medications, health status, including review of consultants reports, laboratory and other test data, was performed as part of comprehensive evaluation and provision of chronic care management services.  ? ?SDOH:  (Social Determinants of Health) assessments and interventions performed: Yes ? ? ? ? ?SDOH Screenings  ? ?Alcohol Screen: Low Risk   ? Last  Alcohol Screening Score (AUDIT): 0  ?Depression (PHQ2-9): Low Risk   ? PHQ-2 Score: 0  ?Financial Resource Strain: Low Risk   ? Difficulty of Paying Living Expenses: Not hard at all  ?Food Insecurity: No Food Insecurity  ? Worried About Charity fundraiser in the Last Year: Never true  ? Ran  Out of Food in the Last Year: Never true  ?Housing: Low Risk   ? Last Housing Risk Score: 0  ?Physical Activity: Insufficiently Active  ? Days of Exercise per Week: 3 days  ? Minutes of Exercise per Session: 30 min  ?Social Connections: Moderately Integrated  ? Frequency of Communication with Friends and Family: Three times a week  ? Frequency of Social Gatherings with Friends and Family: Three times a week  ? Attends Religious Services: More than 4 times per year  ? Active Member of Clubs or Organizations: No  ? Attends Archivist Meetings: Never  ? Marital Status: Married  ?Stress: No Stress Concern Present  ? Feeling of Stress : Not at all  ?Tobacco Use: Medium Risk  ? Smoking Tobacco Use: Former  ? Smokeless Tobacco Use: Never  ? Passive Exposure: Not on file  ?Transportation Needs: No Transportation Needs  ? Lack of Transportation (Medical): No  ? Lack of Transportation (Non-Medical): No  ? ? ?CCM Care Plan ? ?Allergies  ?Allergen Reactions  ? Enalapril Maleate Cough  ?  REACTION: cough  ? Lisinopril Cough  ? Shellfish-Derived Products Swelling  ?  Said occurred twice; has eaten some since and had no reactions  ? Iodine-Kelp [Iodine] Other (See Comments)  ?  Other reaction(s): Unknown  ? ? ?Medications Reviewed Today   ? ? Reviewed by Randel Pigg, LPN (Licensed Practical Nurse) on 04/18/22 at [facility] List Status: <None>  ? ?Medication Order Taking? Sig Documenting Provider Last Dose Status Informant  ?albuterol (VENTOLIN HFA) 108 (90 Base) MCG/ACT inhaler [id number] Yes Inhale 2 puffs into the lungs every 6 (six) hours as needed for wheezing or shortness of breath. Parrett, Fonnie Mu, NP Taking Active Self   ?allopurinol (ZYLOPRIM) 300 MG tablet [id number] Yes TAKE 1 TABLET EVERY DAY Rudd, Lillette Boxer, MD Taking Active   ?apixaban (ELIQUIS) 5 MG TABS tablet [id number]  Take 1 tablet (5 mg total) by mouth 2 (two) times daily. Ru

## 2022-04-18 ENCOUNTER — Ambulatory Visit (INDEPENDENT_AMBULATORY_CARE_PROVIDER_SITE_OTHER): Payer: Medicare Other

## 2022-04-18 DIAGNOSIS — Z0000 Encounter for general adult medical examination without abnormal findings: Secondary | ICD-10-CM
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
## 2022-04-18 NOTE — Patient Instructions (Signed)
Mr. Maglione , ?Thank you for taking time to come for your Medicare Wellness Visit. I appreciate your ongoing commitment to your health goals. Please review the following plan we discussed and let me know if I can assist you in the future.  ? ?Screening recommendations/referrals: ?Colonoscopy: no longer required  ?Recommended yearly ophthalmology/optometry visit for glaucoma screening and checkup ?Recommended yearly dental visit for hygiene and checkup ? ?Vaccinations: ?Influenza vaccine: completed  ?Pneumococcal vaccine: completed  ?Tdap vaccine: 12/24/2013 ?Shingles vaccine: completed    ? ?Advanced directives: none  ? ?Conditions/risks identified: none  ? ?Next appointment: none  ? ?Preventive Care 67 Years and Older, Male ?Preventive care refers to lifestyle choices and visits with your health care provider that can promote health and wellness. ?What does preventive care include? ?A yearly physical exam. This is also called an annual well check. ?Dental exams once or twice a year. ?Routine eye exams. Ask your health care provider how often you should have your eyes checked. ?Personal lifestyle choices, including: ?Daily care of your teeth and gums. ?Regular physical activity. ?Eating a healthy diet. ?Avoiding tobacco and drug use. ?Limiting alcohol use. ?Practicing safe sex. ?Taking low doses of aspirin every day. ?Taking vitamin and mineral supplements as recommended by your health care provider. ?What happens during an annual well check? ?The services and screenings done by your health care provider during your annual well check will depend on your age, overall health, lifestyle risk factors, and family history of disease. ?Counseling  ?Your health care provider may ask you questions about your: ?Alcohol use. ?Tobacco use. ?Drug use. ?Emotional well-being. ?Home and relationship well-being. ?Sexual activity. ?Eating habits. ?History of falls. ?Memory and ability to understand (cognition). ?Work and work  Statistician. ?Screening  ?You may have the following tests or measurements: ?Height, weight, and BMI. ?Blood pressure. ?Lipid and cholesterol levels. These may be checked every 5 years, or more frequently if you are over 30 years old. ?Skin check. ?Lung cancer screening. You may have this screening every year starting at age 57 if you have a 30-pack-year history of smoking and currently smoke or have quit within the past 15 years. ?Fecal occult blood test (FOBT) of the stool. You may have this test every year starting at age 12. ?Flexible sigmoidoscopy or colonoscopy. You may have a sigmoidoscopy every 5 years or a colonoscopy every 10 years starting at age 6. ?Prostate cancer screening. Recommendations will vary depending on your family history and other risks. ?Hepatitis C blood test. ?Hepatitis B blood test. ?Sexually transmitted disease (STD) testing. ?Diabetes screening. This is done by checking your blood sugar (glucose) after you have not eaten for a while (fasting). You may have this done every 1-3 years. ?Abdominal aortic aneurysm (AAA) screening. You may need this if you are a current or former smoker. ?Osteoporosis. You may be screened starting at age 61 if you are at high risk. ?Talk with your health care provider about your test results, treatment options, and if necessary, the need for more tests. ?Vaccines  ?Your health care provider may recommend certain vaccines, such as: ?Influenza vaccine. This is recommended every year. ?Tetanus, diphtheria, and acellular pertussis (Tdap, Td) vaccine. You may need a Td booster every 10 years. ?Zoster vaccine. You may need this after age 64. ?Pneumococcal 13-valent conjugate (PCV13) vaccine. One dose is recommended after age 53. ?Pneumococcal polysaccharide (PPSV23) vaccine. One dose is recommended after age 45. ?Talk to your health care provider about which screenings and vaccines you need and how  often you need them. ?This information is not intended to replace  advice given to you by your health care provider. Make sure you discuss any questions you have with your health care provider. ?Document Released: 01/06/2016 Document Revised: 08/29/2016 Document Reviewed: 10/11/2015 ?Elsevier Interactive Patient Education ? 2017 [place]. ? ?Fall Prevention in the Home ?Falls can cause injuries. They can happen to people of all ages. There are many things you can do to make your home safe and to help prevent falls. ?What can I do on the outside of my home? ?Regularly fix the edges of walkways and driveways and fix any cracks. ?Remove anything that might make you trip as you walk through a door, such as a raised step or threshold. ?Trim any bushes or trees on the path to your home. ?Use bright outdoor lighting. ?Clear any walking paths of anything that might make someone trip, such as rocks or tools. ?Regularly check to see if handrails are loose or broken. Make sure that both sides of any steps have handrails. ?Any raised decks and porches should have guardrails on the edges. ?Have any leaves, snow, or ice cleared regularly. ?Use sand or salt on walking paths during winter. ?Clean up any spills in your garage right away. This includes oil or grease spills. ?What can I do in the bathroom? ?Use night lights. ?Install grab bars by the toilet and in the tub and shower. Do not use towel bars as grab bars. ?Use non-skid mats or decals in the tub or shower. ?If you need to sit down in the shower, use a plastic, non-slip stool. ?Keep the floor dry. Clean up any water that spills on the floor as soon as it happens. ?Remove soap buildup in the tub or shower regularly. ?Attach bath mats securely with double-sided non-slip rug tape. ?Do not have throw rugs and other things on the floor that can make you trip. ?What can I do in the bedroom? ?Use night lights. ?Make sure that you have a light by your bed that is easy to reach. ?Do not use any sheets or blankets that are too big for your bed.  They should not hang down onto the floor. ?Have a firm chair that has side arms. You can use this for support while you get dressed. ?Do not have throw rugs and other things on the floor that can make you trip. ?What can I do in the kitchen? ?Clean up any spills right away. ?Avoid walking on wet floors. ?Keep items that you use a lot in easy-to-reach places. ?If you need to reach something above you, use a strong step stool that has a grab bar. ?Keep electrical cords out of the way. ?Do not use floor polish or wax that makes floors slippery. If you must use wax, use non-skid floor wax. ?Do not have throw rugs and other things on the floor that can make you trip. ?What can I do with my stairs? ?Do not leave any items on the stairs. ?Make sure that there are handrails on both sides of the stairs and use them. Fix handrails that are broken or loose. Make sure that handrails are as long as the stairways. ?Check any carpeting to make sure that it is firmly attached to the stairs. Fix any carpet that is loose or worn. ?Avoid having throw rugs at the top or bottom of the stairs. If you do have throw rugs, attach them to the floor with carpet tape. ?Make sure that you have a  light switch at the top of the stairs and the bottom of the stairs. If you do not have them, ask someone to add them for you. ?What else can I do to help prevent falls? ?Wear shoes that: ?Do not have high heels. ?Have rubber bottoms. ?Are comfortable and fit you well. ?Are closed at the toe. Do not wear sandals. ?If you use a stepladder: ?Make sure that it is fully opened. Do not climb a closed stepladder. ?Make sure that both sides of the stepladder are locked into place. ?Ask someone to hold it for you, if possible. ?Clearly mark and make sure that you can see: ?Any grab bars or handrails. ?First and last steps. ?Where the edge of each step is. ?Use tools that help you move around (mobility aids) if they are needed. These  include: ?Canes. ?Walkers. ?Scooters. ?Crutches. ?Turn on the lights when you go into a dark area. Replace any light bulbs as soon as they burn out. ?Set up your furniture so you have a clear path. Avoid moving your furniture around.

## 2022-04-22 DIAGNOSIS — E1169 Type 2 diabetes mellitus with other specified complication: Secondary | ICD-10-CM

## 2022-04-22 DIAGNOSIS — E785 Hyperlipidemia, unspecified: Secondary | ICD-10-CM

## 2022-04-22 DIAGNOSIS — J449 Chronic obstructive pulmonary disease, unspecified: Secondary | ICD-10-CM | POA: Diagnosis not present

## 2022-04-24 NOTE — Patient Instructions (Signed)
Visit Information ?It was great speaking with you today!  Please let me know if you have any questions about our visit. ? ? Goals Addressed   ?None ?  ? ? ?Patient Care Plan: General Pharmacy (Adult)  ?  ? ?Problem Identified: Hypertension, Hyperlipidemia, Diabetes, Heart Failure, Coronary Artery Disease, GERD, COPD, Chronic Kidney Disease, Depression, Osteoporosis, Osteoarthritis, BPH, Gout and Allergic Rhinitis   ?Priority: High  ?  ? ?Long-Range Goal: Patient-Specific Goal   ?Start Date: 04/12/2021  ?Expected End Date: 08/22/2022  ?This Visit's Progress: On track  ?Recent Progress: On track  ?Priority: High  ?Note:   ?Current Barriers:  ?Unable to independently afford treatment regimen ? ?Pharmacist Clinical Goal(s):  ?Patient will verbalize ability to afford treatment regimen ?maintain control of Heart Failure as evidenced by stable weight, lack of exacerbations  through collaboration with PharmD and provider.  ? ?Interventions: ?1:1 collaboration with Arlester Marker, MD regarding development and update of comprehensive plan of care as evidenced by provider attestation and co-signature ?Inter-disciplinary care team collaboration (see longitudinal plan of care) ?Comprehensive medication review performed; medication list updated in electronic medical record ? ?Heart Failure (Goal: manage symptoms and prevent exacerbations) ?-Managed by Dr. Angelena Form  ?-Controlled ?-Last ejection fraction: 55-60% (Date: Feb 2019) ?-HF type: Diastolic ?-NYHA Class: II (slight limitation of activity) ?-AHA HF Stage: B (Heart disease and symptoms present) ?-Current treatment: ?Carvedilol 25 mg twice daily: Appropriate, Effective, Safe, Accessible  ?Torsemide 20 mg 3 tablets daily: Appropriate, Effective, Safe, Accessible  ?Isosorbide Mononitrate 60 mg daily: Appropriate, Effective, Safe, Accessible  ?-Medications previously tried: NA  ?-Current home BP/HR readings: NA ?-Current dietary habits: Limiting salt.   ?-Current exercise habits:  Previously going to [facility].  ?-Educated on Importance of weighing daily; if you gain more than 3 pounds in one day or 5 pounds in one week, contact provider's office ?-Recommended to continue current medication ? ?Atrial Fibrillation (Goal: prevent stroke and major bleeding) ?-Controlled ?-CHADSVASC: 8 ?-Current treatment: ?Rate control: Carvedilol 25 mg twice daily  ?Anticoagulation: Eliquis 5 mg twice daily  ?-Medications previously tried: NA ?-Recommended to continue current medication ? ? ?Hyperlipidemia: (LDL goal < 70) ?-Controlled ?-Current treatment: ?Rosuvastatin 10 mg daily  ?-Medications previously tried: NA  ?-Educated on Importance of limiting foods high in cholesterol; ?-Recommended to continue current medication ? ?Diabetes (A1c goal <8%) ?-Controlled ?-Current medications: ?Farxiga 10 mg daily: Appropriate, Effective, Safe, Accessible ?Novolog 7-9 units before meals: Appropriate, Effective, Safe, Accessible   ?Trulicity 4.5 mg weekly: Appropriate, Effective, Safe, Accessible  ?-Medications previously tried: Prandin (hypoglycemia, falls) ?-Current home glucose readings ?Fasting: 122, 149, 178, 98, 165  ?Afternoon: 147, 160, 156, 178 ?-Recommended to continue current medication ? ?COPD (Goal: control symptoms and prevent exacerbations) ?-Controlled ?-Current treatment  ?Ventolin HFA 2 puffs every 6 hours as needed  ?Advair 1 puff twice daily ?Spiriva Respimat 1 puff daily  ?-Current allergic rhinitis  treatment  ?Azelastine Nasal spray 2 spray twice daily as needed  ?Cetirizine 10 mg daily  ?Benadryl 25 mg nightly  ?Guaifenesin daily as needed - Drainage  ?-Medications previously tried: NA  ?-Gold Grade: Gold 3 (FEV1 30-49%) ?-Current COPD Classification:  B (high sx, <2 exacerbations/yr) ?-MMRC/CAT score: 10  ?-Pulmonary function testing: FEV1 39% (2013) ?-Exacerbations requiring treatment in last 6 months: No ?-Patient reports consistent use of maintenance inhaler ?-Frequency of rescue inhaler use: few  times monthly ?-Counseled on Benefits of consistent maintenance inhaler use ?When to use rescue inhaler ?-Recommended to continue current medication ? ?Depression/Insomnia (Goal: Maintain  stable mood/sleep) ?-Controlled ?-Current treatment: ?Citalopram 10 mg daily  ?Trazodone 100 mg nightly  ?-Medications previously tried/failed: NA ?-PHQ9: 0 ?-GAD7: NA ?- A couple of nights a year he has a hard time falling asleep. He attributes this to barimetric changes in the weather. Overall feels he has good sleep quality, sleeps soundly through the night. He tends to sleep from 3a-11a due to caring for his daughter who suffers from [place].  ?-Educated on Benefits of medication for symptom control  ?-Counseled on proper sleep hygiene ?-Recommended to continue current medication ? ?Gout (Goal: Prevent gout flares) ?-Controlled ?-Current treatment  ?Allopurinol 300 mg daily  ?-Medications previously tried: NA ?-Last gout flare: 2021  ?-Counseled patient on low-purine diet ?-Recommended to continue current medication ? ?Patient Goals/Self-Care Activities ?Patient will:  ?- check glucose before meals and at bedtime, document, and provide at future appointments ?check blood pressure 2-3 times weekly, document, and provide at future appointments ?weigh daily, and contact provider if weight gain of greater than 3 pounds in one day ? ?Follow Up Plan: Telephone follow up appointment with care management team member scheduled for:  07/19/2022 at 3:45 PM ?  ? ? ? ?Patient agreed to services and verbal consent obtained.  ? ?Patient verbalizes understanding of instructions and care plan provided today and agrees to view in [place]. Active MyChart status confirmed with patient.   ? ?Junius Argyle, PharmD, BCACP, CPP ?Clinical Pharmacist Practitioner  ?[facility] at [facility]  ?[phone]  ?

## 2022-05-02 ENCOUNTER — Ambulatory Visit: Payer: Medicare Other | Admitting: Endocrinology

## 2022-05-28 ENCOUNTER — Other Ambulatory Visit: Payer: Self-pay | Admitting: Nephrology

## 2022-05-28 DIAGNOSIS — N2581 Secondary hyperparathyroidism of renal origin: Secondary | ICD-10-CM

## 2022-05-28 DIAGNOSIS — N1832 Chronic kidney disease, stage 3b: Secondary | ICD-10-CM

## 2022-05-30 ENCOUNTER — Ambulatory Visit
Admission: RE | Admit: 2022-05-30 | Discharge: 2022-05-30 | Disposition: A | Discharge status: Home / Self Care | Payer: Medicare Other | Source: Ambulatory Visit | Attending: Nephrology | Admitting: Nephrology

## 2022-05-30 DIAGNOSIS — N2581 Secondary hyperparathyroidism of renal origin: Secondary | ICD-10-CM

## 2022-05-30 DIAGNOSIS — N1832 Chronic kidney disease, stage 3b: Secondary | ICD-10-CM

## 2022-06-19 ENCOUNTER — Encounter: Payer: Self-pay | Admitting: Family Medicine

## 2022-06-19 ENCOUNTER — Ambulatory Visit: Payer: Medicare Other | Admitting: Family Medicine

## 2022-06-19 VITALS — BP 116/68 | HR 82 | Temp 97.8°F | Ht 63.0 in | Wt 208.0 lb

## 2022-06-19 DIAGNOSIS — H6123 Impacted cerumen, bilateral: Secondary | ICD-10-CM | POA: Diagnosis not present

## 2022-06-19 DIAGNOSIS — E1122 Type 2 diabetes mellitus with diabetic chronic kidney disease: Secondary | ICD-10-CM

## 2022-06-19 DIAGNOSIS — H903 Sensorineural hearing loss, bilateral: Secondary | ICD-10-CM | POA: Insufficient documentation

## 2022-06-19 DIAGNOSIS — I10 Essential (primary) hypertension: Secondary | ICD-10-CM

## 2022-06-19 DIAGNOSIS — J449 Chronic obstructive pulmonary disease, unspecified: Secondary | ICD-10-CM

## 2022-06-19 DIAGNOSIS — I5032 Chronic diastolic (congestive) heart failure: Secondary | ICD-10-CM

## 2022-06-19 DIAGNOSIS — E782 Mixed hyperlipidemia: Secondary | ICD-10-CM

## 2022-06-19 DIAGNOSIS — E785 Hyperlipidemia, unspecified: Secondary | ICD-10-CM | POA: Insufficient documentation

## 2022-06-19 DIAGNOSIS — N1832 Chronic kidney disease, stage 3b: Secondary | ICD-10-CM

## 2022-06-19 DIAGNOSIS — I480 Paroxysmal atrial fibrillation: Secondary | ICD-10-CM

## 2022-06-20 LAB — GLUCOSE, RANDOM: Glucose, Bld: 190 mg/dL — ABNORMAL HIGH (ref 70–99)

## 2022-06-20 LAB — HEMOGLOBIN A1C: Hgb A1c MFr Bld: 6.9 % — ABNORMAL HIGH (ref 4.6–6.5)

## 2022-07-18 ENCOUNTER — Telehealth: Payer: Self-pay

## 2022-07-18 NOTE — Progress Notes (Signed)
Chronic Care Management APPOINTMENT REMINDER   [name]. was reminded to have all medications, supplements and any blood glucose and blood pressure readings available for review with [name], Pharm. D, at his telephone visit on 07/19/2022 at 2:00 pm.  Patient confirm appointment.  [place] Pharmacist Assistant [phone]

## 2022-07-19 ENCOUNTER — Ambulatory Visit: Payer: Medicare Other

## 2022-07-19 ENCOUNTER — Telehealth: Payer: Medicare Other

## 2022-07-19 DIAGNOSIS — E1169 Type 2 diabetes mellitus with other specified complication: Secondary | ICD-10-CM

## 2022-07-19 DIAGNOSIS — J449 Chronic obstructive pulmonary disease, unspecified: Secondary | ICD-10-CM

## 2022-07-19 NOTE — Patient Instructions (Signed)
Visit Information It was great speaking with you today!  Please let me know if you have any questions about our visit.   Goals Addressed             This Visit's Progress    Track and Manage Fluids and Swelling-Heart Failure   On track    Timeframe:  Long-Range Goal Priority:  High Start Date:  04/12/2021                           Expected End Date:  10/12/2022                     Follow Up within 90 days   - call office if I gain more than 2 pounds in one day or 5 pounds in one week - keep legs up while sitting - use salt in moderation - watch for swelling in feet, ankles and legs every day - weigh myself daily    Why is this important?   It is important to check your weight daily and watch how much salt and liquids you have.  It will help you to manage your heart failure.    Notes:         Patient Care Plan: General Pharmacy (Adult)     Problem Identified: Hypertension, Hyperlipidemia, Diabetes, Heart Failure, Coronary Artery Disease, GERD, COPD, Chronic Kidney Disease, Depression, Osteoporosis, Osteoarthritis, BPH, Gout and Allergic Rhinitis   Priority: High     Long-Range Goal: Patient-Specific Goal   Start Date: 04/12/2021  Expected End Date: 07/20/2023  This Visit's Progress: On track  Recent Progress: On track  Priority: High  Note:   Current Barriers:  Unable to independently afford treatment regimen  Pharmacist Clinical Goal(s):  Patient will verbalize ability to afford treatment regimen maintain control of Heart Failure as evidenced by stable weight, lack of exacerbations  through collaboration with PharmD and provider.   Interventions: 1:1 collaboration with Arlester Marker, MD regarding development and update of comprehensive plan of care as evidenced by provider attestation and co-signature Inter-disciplinary care team collaboration (see longitudinal plan of care) Comprehensive medication review performed; medication list updated in electronic medical  record  Heart Failure (Goal: manage symptoms and prevent exacerbations) -Managed by Dr. Angelena Form  -Controlled -Last ejection fraction: 55-60% (Date: Feb 2019) -HF type: Diastolic -NYHA Class: II (slight limitation of activity) -AHA HF Stage: B (Heart disease and symptoms present) -Current treatment: Carvedilol 25 mg twice daily: Appropriate, Effective, Safe, Accessible  Torsemide 20 mg 3 tablets daily: Appropriate, Effective, Safe, Accessible  Isosorbide Mononitrate 60 mg daily: Appropriate, Effective, Safe, Accessible  -Medications previously tried: NA  -Current home BP/HR readings: NA -Current dietary habits: Limiting salt.   -Current exercise habits: Previously going to [employer].  -Educated on Importance of weighing daily; if you gain more than 3 pounds in one day or 5 pounds in one week, contact provider's office -Recommended to continue current medication  Atrial Fibrillation (Goal: prevent stroke and major bleeding) -Controlled -CHADSVASC: 8 -Current treatment: Rate control: Carvedilol 25 mg twice daily  Anticoagulation: Eliquis 5 mg twice daily  -Medications previously tried: NA -Recommended to continue current medication  Hyperlipidemia: (LDL goal < 70) -Controlled -Current treatment: Rosuvastatin 10 mg daily  -Medications previously tried: NA  -Educated on Importance of limiting foods high in cholesterol; -Recommended to continue current medication  Diabetes (A1c goal <8%) -Controlled -Current medications: Farxiga 10 mg daily: Appropriate, Effective, Safe, Accessible Novolog 7-9 units  before meals: Appropriate, Effective, Safe, Accessible   Trulicity 4.5 mg weekly: Appropriate, Effective, Safe, Accessible  -Medications previously tried: Prandin (hypoglycemia, falls) -Current home glucose readings Fasting: 155, 218   Afternoon: 219, 149, 164, 218, 163, 287 (missed Novolog)  -Recommended to continue current medication  COPD (Goal: control symptoms and prevent  exacerbations) -Controlled -Current treatment  Ventolin HFA 2 puffs every 6 hours as needed  Advair 1 puff twice daily Spiriva Respimat 1 puff daily  -Current allergic rhinitis  treatment  Azelastine Nasal spray 2 spray twice daily as needed  Cetirizine 10 mg daily  Benadryl 25 mg nightly  Guaifenesin daily as needed - Drainage  -Medications previously tried: NA  -Gold Grade: Gold 3 (FEV1 30-49%) -Current COPD Classification:  B (high sx, <2 exacerbations/yr) -MMRC score:  -Pulmonary function testing: FEV1 39% (2013) -Exacerbations requiring treatment in last 6 months: No -Patient reports consistent use of maintenance inhaler -Frequency of rescue inhaler use: few times monthly -Counseled on Benefits of consistent maintenance inhaler use When to use rescue inhaler -Recommended to continue current medication  Depression/Insomnia (Goal: Maintain stable mood/sleep) -Controlled -Current treatment: Citalopram 10 mg daily  Trazodone 100 mg nightly  -Medications previously tried/failed: NA -PHQ9: 0 -GAD7: NA -Recommended to continue current medication  Gout (Goal: Prevent gout flares) -Controlled -Current treatment  Allopurinol 300 mg daily  -Medications previously tried: NA -Last gout flare: 2021  -Counseled patient on low-purine diet -Recommended to continue current medication  Patient Goals/Self-Care Activities Patient will:  - check glucose before meals and at bedtime, document, and provide at future appointments check blood pressure 2-3 times weekly, document, and provide at future appointments weigh daily, and contact provider if weight gain of greater than 3 pounds in one day  Follow Up Plan: Telephone follow up appointment with care management team member scheduled for:  01/03/23 at 3:00 PM      Patient agreed to services and verbal consent obtained.   Patient verbalizes understanding of instructions and care plan provided today and agrees to view in [place].  Active MyChart status and patient understanding of how to access instructions and care plan via MyChart confirmed with patient.     Junius Argyle, PharmD, [place], CPP Clinical Pharmacist Practitioner  [facility] at [facility]  [phone]

## 2022-07-19 NOTE — Progress Notes (Signed)
Chronic Care Management Pharmacy Note  07/19/2022 Name:  [name]. MRN:  [id number] DOB:  02-07-1941  Summary: Patient presents for CCM follow-up.   Recommendations/Changes made from today's visit: Continue current medications  Plan: CPP follow-up 6 months  Subjective: [name]. is an 81 y.o. year old male who is a primary patient of Rudd, Lillette Boxer, MD.  The CCM team was consulted for assistance with disease management and care coordination needs.    Engaged with patient by telephone for follow up visit in response to provider referral for pharmacy case management and/or care coordination services.   Consent to Services:  The patient was given information about Chronic Care Management services, agreed to services, and gave verbal consent prior to initiation of services.  Please see initial visit note for detailed documentation.   Patient Care Team: Haydee Salter, MD as PCP - General (Family Medicine) Burnell Blanks, MD as PCP - Cardiology (Cardiology) Burnell Blanks, MD as Consulting Physician (Cardiology) Sharyne Peach, MD as Attending Physician (Ophthalmology) Bensimhon, Shaune Pascal, MD as Attending Physician (Cardiology) Jari Pigg, MD as Attending Physician (Dermatology) Germaine Pomfret, [facility] as Pharmacist (Pharmacist) [name], MD (Inactive) as Consulting Physician (Endocrinology) Burnell Blanks, MD as Consulting Physician (Cardiology) Collene Gobble, MD as Consulting Physician (Pulmonary Disease)  Recent office visits: 06/19/22: Patient presented to Dr. Gena Fray for follow-up. 03/20/22: Patient presented to Dr. Gena Fray for follow-up. BP 106/58, stopped clopidogrel.   Recent consult visits: 02/07/22: Patient presented to Dr. Angelena Form (Cardiology) for follow-up.   12/28/21: Patient presented to Dr. Loanne Drilling (Endocrinology). No medication changes made.  12/01/21: Patient presented to Dr. Angelena Form (Cardiology) for follow-up.    11/28/21: Patient presented to Dr. Ronnald Ramp (Nephrology)   Hospital visits: 12/08/21: Patient admitted for cardioversion.   Objective:  Lab Results  Component Value Date   CREATININE 1.64 (H) 12/20/2021   BUN 36 (H) 12/20/2021   GFR 39.50 (L) 12/20/2021   GFRNONAA 45 (L) 12/08/2021   GFRAA 59 (L) 08/20/2017   NA 137 12/20/2021   K 3.8 12/20/2021   CALCIUM 8.7 12/20/2021   CO2 36 (H) 12/20/2021   GLUCOSE 190 (H) 06/19/2022    Lab Results  Component Value Date/Time   HGBA1C 6.9 (H) 06/19/2022 03:45 PM   HGBA1C 6.5 (A) 12/28/2021 02:18 PM   HGBA1C 6.6 (H) 12/20/2021 03:19 PM   HGBA1C 7.1 12/29/2019 12:00 AM   FRUCTOSAMINE 268 08/24/2016 01:50 PM   GFR 39.50 (L) 12/20/2021 03:19 PM   GFR 41.65 (L) 11/22/2021 12:14 PM   MICROALBUR 19.3 (H) 09/20/2021 02:10 PM   MICROALBUR 38.0 (H) 02/10/2015 09:33 AM    Last diabetic Eye exam:  Lab Results  Component Value Date/Time   HMDIABEYEEXA No Retinopathy 08/10/2021 12:00 AM    Last diabetic Foot exam: No results found for: "HMDIABFOOTEX"   Lab Results  Component Value Date   CHOL 133 09/20/2021   HDL 46.20 09/20/2021   LDLCALC 67 09/20/2021   TRIG 98.0 09/20/2021   CHOLHDL 3 09/20/2021       Latest Ref Rng & Units 11/08/2021    3:39 AM 12/29/2019   12:00 AM 08/29/2018    2:34 PM  Hepatic Function  Total Protein 6.5 - 8.1 g/dL 5.7   5.8   Albumin 3.5 - 5.0 g/dL 2.7  3.0     3.4   AST 15 - 41 U/L '28  8     12   ' ALT 0 - 44 U/L 24  21     13   Alk Phosphatase 38 - 126 U/L 61   69   Total Bilirubin 0.3 - 1.2 mg/dL 0.6   0.4   Bilirubin, Direct 0.0 - 0.2 mg/dL <0.1   0.1      This result is from an external source.    Lab Results  Component Value Date/Time   TSH 2.937 11/07/2021 04:28 PM   TSH 2.38 12/29/2019 12:00 AM   TSH 2.10 08/29/2018 02:34 PM   TSH 2.12 04/24/2017 01:35 PM       Latest Ref Rng & Units 12/08/2021    9:00 AM 11/11/2021    8:07 AM 11/10/2021    1:55 AM  CBC  WBC 4.0 - 10.5 K/uL 6.1  9.6  9.9    Hemoglobin 13.0 - 17.0 g/dL 12.0  12.3  13.0   Hematocrit 39.0 - 52.0 % 38.4  38.2  39.5   Platelets 150 - 400 K/uL 172  218  256     Lab Results  Component Value Date/Time   VD25OH 35.80 08/29/2018 02:34 PM   VD25OH 26.40 (L) 04/24/2017 01:35 PM    Clinical ASCVD: Yes  The ASCVD Risk score (Arnett DK, et al., 2019) failed to calculate for the following reasons:   The 2019 ASCVD risk score is only valid for ages 12 to 8   The patient has a prior MI or stroke diagnosis       04/18/2022    3:40 PM 04/18/2022    3:38 PM 01/11/2022    1:11 PM  Depression screen PHQ 2/9  Decreased Interest 0 0 0  Down, Depressed, Hopeless 0 0 0  PHQ - 2 Score 0 0 0    Social History   Tobacco Use  Smoking Status Former   Packs/day: 2.00   Years: 41.00   Total pack years: 82.00   Types: Cigarettes   Quit date: 12/24/1997   Years since quitting: 24.5  Smokeless Tobacco Never  Tobacco Comments   Former smoker 11/23/21   BP Readings from Last 3 Encounters:  06/19/22 116/68  03/20/22 (!) 106/58  02/07/22 114/70   Pulse Readings from Last 3 Encounters:  06/19/22 82  03/20/22 97  02/07/22 84   Wt Readings from Last 3 Encounters:  06/19/22 208 lb (94.3 kg)  03/20/22 206 lb (93.4 kg)  02/07/22 205 lb 8 oz (93.2 kg)   BMI Readings from Last 3 Encounters:  06/19/22 36.85 kg/m  03/20/22 36.49 kg/m  02/07/22 36.40 kg/m    Assessment/Interventions: Review of patient past medical history, allergies, medications, health status, including review of consultants reports, laboratory and other test data, was performed as part of comprehensive evaluation and provision of chronic care management services.   SDOH:  (Social Determinants of Health) assessments and interventions performed: Yes     SDOH Screenings   Alcohol Screen: Low Risk  (04/18/2022)   Alcohol Screen    Last Alcohol Screening Score (AUDIT): 0  Depression (PHQ2-9): Low Risk  (04/18/2022)   Depression (PHQ2-9)    PHQ-2  Score: 0  Financial Resource Strain: Low Risk  (04/18/2022)   Overall Financial Resource Strain (CARDIA)    Difficulty of Paying Living Expenses: Not hard at all  Food Insecurity: No Food Insecurity (04/18/2022)   Hunger Vital Sign    Worried About Running Out of Food in the Last Year: Never true    Ran Out of Food in the Last Year: Never true  Housing: Low Risk  (04/18/2022)  Housing    Last Housing Risk Score: 0  Physical Activity: Insufficiently Active (04/18/2022)   Exercise Vital Sign    Days of Exercise per Week: 3 days    Minutes of Exercise per Session: 30 min  Social Connections: Moderately Integrated (04/18/2022)   Social Connection and Isolation Panel [NHANES]    Frequency of Communication with Friends and Family: Three times a week    Frequency of Social Gatherings with Friends and Family: Three times a week    Attends Religious Services: More than 4 times per year    Active Member of Clubs or Organizations: No    Attends Archivist Meetings: Never    Marital Status: Married  Stress: No Stress Concern Present (04/18/2022)   [place]    Feeling of Stress : Not at all  Tobacco Use: Medium Risk (06/19/2022)   Patient History    Smoking Tobacco Use: Former    Smokeless Tobacco Use: Never    Passive Exposure: Not on file  Transportation Needs: No Transportation Needs (04/18/2022)   PRAPARE - Hydrologist (Medical): No    Lack of Transportation (Non-Medical): No    CCM Care Plan  Allergies  Allergen Reactions   Enalapril Maleate Cough    REACTION: cough   Lisinopril Cough   Shellfish-Derived Products Swelling    Said occurred twice; has eaten some since and had no reactions   Iodine-Kelp [Iodine] Other (See Comments)    Other reaction(s): Unknown    Medications Reviewed Today     Reviewed by Haydee Salter, MD (Physician) on 06/19/22 at 1658  Med List Status:  <None>   Medication Order Taking? Sig Documenting Provider Last Dose Status Informant  albuterol (VENTOLIN HFA) 108 (90 Base) MCG/ACT inhaler [id number] Yes Inhale 2 puffs into the lungs every 6 (six) hours as needed for wheezing or shortness of breath. Parrett, Fonnie Mu, NP Taking Active Self  allopurinol (ZYLOPRIM) 300 MG tablet [id number]  TAKE 1 TABLET EVERY DAY Rudd, Lillette Boxer, MD  Active   apixaban (ELIQUIS) 5 MG TABS tablet [id number]  Take 1 tablet (5 mg total) by mouth 2 (two) times daily. Haydee Salter, MD  Expired 02/20/22 2359 Self  Azelastine HCl 137 MCG/SPRAY SOLN [id number]  INSTILL 2 SPRAYS INTO BOTH NOSTRILS TWICE DAILY AS NEEDED FOR RHINITIS. Collene Gobble, MD  Active Self  B Complex Vitamins (VITAMIN B COMPLEX PO) [id number]  Take 1 tablet by mouth daily. [provider]  Active Self    Discontinued 06/19/22 1505 (Completed Course) calcitRIOL (ROCALTROL) 0.25 MCG capsule [id number]  Take 1 capsule (0.25 mcg total) by mouth daily. Haydee Salter, MD  Active   carvedilol (COREG) 25 MG tablet [id number]  Take 1 tablet (25 mg total) by mouth 2 (two) times daily with a meal. Haydee Salter, MD  Active   cetirizine (ZYRTEC) 10 MG tablet [id number]  Take 10 mg by mouth as needed for allergies.  [provider]  Active Self  cholecalciferol (VITAMIN D3) 25 MCG (1000 UNIT) tablet [id number]  Take 1,000 Units by mouth daily. [provider]  Active Self  citalopram (CELEXA) 10 MG tablet [id number]  TAKE 1 TABLET EVERY DAY Rudd, Lillette Boxer, MD  Active   dapagliflozin propanediol (FARXIGA) 10 MG TABS tablet [id number]  Take 1 tablet (10 mg total) by mouth daily. Patient receives through [place] Rudd, Lillette Boxer, MD  Active Self  diphenhydrAMINE (BENADRYL) 25 MG tablet [id number]  Take 25 mg by mouth at bedtime. [provider]  Active Self  Dulaglutide (TRULICITY) 4.5 NK/5.3ZJ SOPN [id number]  Inject 4.5 mg as directed once a week. [name], MD   Active Self           Med Note Lia Hopping, MINDY L   Fri Dec 01, 2021  8:31 AM)    ferrous sulfate 324 MG TBEC [id number]  Take 324 mg by mouth daily at 2 PM. [provider]  Active Self  finasteride (PROSCAR) 5 MG tablet [id number]  Take 1 tablet (5 mg total) by mouth daily. Haydee Salter, MD  Active   fluticasone-salmeterol (ADVAIR) 250-50 MCG/ACT AEPB [id number]  Inhale 1 puff into the lungs 2 (two) times daily. [place] [provider]  Active     Discontinued 06/19/22 1505 (Completed Course)   Discontinued 06/19/22 1505 (Completed Course) hydrocortisone 2.5 % cream [id number]  Apply 1 application topically. [provider]  Active Self  insulin aspart (NOVOLOG) 100 UNIT/ML injection [id number]  Inject 7-9 Units into the skin 3 (three) times daily before meals. [name], MD  Active Self  isosorbide mononitrate (IMDUR) 60 MG 24 hr tablet [id number]  Take 1 tablet (60 mg total) by mouth daily. Haydee Salter, MD  Active   ketoconazole (NIZORAL) 2 % cream [id number]  Apply 1 application topically daily as needed for irritation.  [provider]  Active Self  Multiple Vitamins-Minerals (MULTIVITAMIN PO) [id number]  Take 1 tablet by mouth daily. [provider]  Active Self  nitroGLYCERIN (NITROLINGUAL) 0.4 MG/SPRAY spray [id number]  Place 1 spray under the tongue as directed. Burnell Blanks, MD  Active Self  nystatin cream Royden Purl) [id number]  Apply 1 application topically 2 (two) times daily. [provider]  Active Self  OXYGEN [id number]  Inhale 2 L into the lungs at bedtime. [provider]  Active   pantoprazole (PROTONIX) 40 MG tablet [id number]  Take 1 tablet (40 mg total) by mouth daily. Haydee Salter, MD  Active   polyethylene glycol powder [facility]) 17 GM/SCOOP powder [id number]  Take 17 g by mouth daily as needed (constipation). [provider]  Active Self  Protein (UNJURY UNFLAVORED PO) [id number]  Take 8-16  oz by mouth daily. [provider]  Active Self  rosuvastatin (CRESTOR) 10 MG tablet [id number]  Take 1 tablet (10 mg total) by mouth daily. Haydee Salter, MD  Active   Tiotropium Bromide Monohydrate (SPIRIVA RESPIMAT) 2.5 MCG/ACT AERS [id number]  Inhale 1 puff into the lungs daily. [provider]  Active Self  torsemide (DEMADEX) 20 MG tablet [id number]  Take 3 tablets (60 mg total) by mouth daily. Haydee Salter, MD  Active   traZODone (DESYREL) 100 MG tablet [id number]  TAKE 1 TABLET AT BEDTIME Haydee Salter, MD  Active             Patient Active Problem List   Diagnosis Date Noted   Hyperlipidemia 06/19/2022   Sensorineural hearing loss, bilateral 06/19/2022   Primary insomnia 03/20/2022   Ceruminosis, bilateral 01/11/2022   Secondary hypercoagulable state ([place]) 11/23/2021   Type 2 diabetes mellitus with hyperlipidemia ([place]) 11/14/2021   Malnutrition of moderate degree 11/10/2021   Increased anion gap metabolic acidosis 62/26/3335   Atrial fibrillation ([place]) 11/07/2021   Chronic kidney disease, stage 3b ([place]) 09/20/2021   History of stroke 09/20/2021   Abnormal x-ray of lung 08/14/2017   Chronic respiratory failure with hypoxia and  hypercapnia ([place]) 09/04/2016   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   Obesity (BMI 30-39.9) 08/30/2014   NSTEMI (non-ST elevated myocardial infarction) ([place]) 07/17/2013   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   Encounter for long-term (current) use of other medications 11/24/2012   History of laparoscopic adjustable gastric banding 03/20/2012   Depression 12/14/2011   Physical deconditioning 12/14/2011   GERD (gastroesophageal reflux disease) 10/31/2011   Iron deficiency anemia 05/01/2010   History of basal cell carcinoma of skin 10/31/2009   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 07/27/2009   Coronary artery disease 02/02/2009   Chronic  diastolic heart failure ([place]) 02/02/2009   Hypokalemia 06/03/2008   Osteoporosis 04/16/2008   Essential hypertension 04/01/2008   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   Peripheral vascular disease ([place]) 09/01/2007   Type 2 diabetes mellitus with diabetic chronic kidney disease ([place]) 05/12/2007   Impotence 05/12/2007   Hemiplegia, 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[place]) 05/12/2007   Allergic rhinitis 05/12/2007   Osteoarthritis 05/12/2007    Immunization History  Administered Date(s) Administered   Fluad Quad(high Dose 65+) 08/25/2019, 10/04/2020, 09/20/2021   Influenza Split 09/14/2011, 09/23/2012, 09/03/2013   Influenza Whole 09/10/2008, 09/23/2009, 08/24/2010   Influenza, High Dose Seasonal PF 09/12/2010, 09/24/2011, 08/24/2012, 08/24/2014, 09/12/2015, 09/24/2015, 09/19/2016, 09/23/2017, 10/28/2017, 08/29/2018   Influenza,inj,Quad PF,6+ Mos 09/13/2014   Influenza-Unspecified 08/26/2009, 09/12/2010, 10/24/2010, 09/09/2019, 09/23/2020, 08/24/2021   Moderna Sars-Covid-2 Vaccination 01/06/2020, 02/03/2020, 08/24/2020   Pneumococcal Conjugate-13 11/11/2014   Pneumococcal Polysaccharide-23 07/24/1996, 07/17/2013, 01/06/2015   Pneumococcal-Unspecified 09/23/2005   Td 02/22/2004   Tdap 12/24/2013, 11/11/2014   Tetanus 10/25/2007   Zoster Recombinat (Shingrix) 12/12/2020, 03/30/2021    Conditions to be addressed/monitored:  Hypertension, Hyperlipidemia, Diabetes, Heart Failure, Coronary Artery Disease, GERD, COPD, Chronic Kidney Disease, Depression, Osteoporosis, Osteoarthritis, BPH, Gout and Allergic Rhinitis  Care Plan : General Pharmacy (Adult)  Updates made by Germaine Pomfret, RPH since 07/19/2022 12:00 AM     Problem: Hypertension, Hyperlipidemia, Diabetes, Heart Failure, Coronary Artery Disease, GERD, COPD, Chronic Kidney Disease, Depression, Osteoporosis, Osteoarthritis, BPH, Gout and Allergic Rhinitis   Priority: High     Long-Range Goal:  Patient-Specific Goal   Start Date: 04/12/2021  Expected End Date: 07/20/2023  This Visit's Progress: On track  Recent Progress: On track  Priority: High  Note:   Current Barriers:  Unable to independently afford treatment regimen  Pharmacist Clinical Goal(s):  Patient will verbalize ability to afford treatment regimen maintain control of Heart Failure as evidenced by stable weight, lack of exacerbations  through collaboration with PharmD and provider.   Interventions: 1:1 collaboration with Arlester Marker, MD regarding development and update of comprehensive plan of care as evidenced by provider attestation and co-signature Inter-disciplinary care team collaboration (see longitudinal plan of care) Comprehensive medication review performed; medication list updated in electronic medical record  Heart Failure (Goal: manage symptoms and prevent exacerbations) -Managed by Dr. Angelena Form  -Controlled -Last ejection fraction: 55-60% (Date: Feb 2019) -HF type: Diastolic -NYHA Class: II (slight limitation of activity) -AHA HF Stage: B (Heart disease and symptoms present) -Current treatment: Carvedilol 25 mg twice daily: Appropriate, Effective, Safe, Accessible  Torsemide 20 mg 3 tablets daily: Appropriate, Effective, Safe, Accessible  Isosorbide Mononitrate 60 mg daily: Appropriate, Effective, Safe, Accessible  -Medications previously tried: NA  -Current home BP/HR readings: NA -Current dietary habits: Limiting salt.   -Current exercise habits: Previously going to [place] on Importance of weighing daily; if you gain  more than 3 pounds in one day or 5 pounds in one week, contact provider's office -Recommended to continue current medication  Atrial Fibrillation (Goal: prevent stroke and major bleeding) -Controlled -CHADSVASC: 8 -Current treatment: Rate control: Carvedilol 25 mg twice daily  Anticoagulation: Eliquis 5 mg twice daily  -Medications previously tried: NA -Recommended  to continue current medication  Hyperlipidemia: (LDL goal < 70) -Controlled -Current treatment: Rosuvastatin 10 mg daily  -Medications previously tried: NA  -Educated on Importance of limiting foods high in cholesterol; -Recommended to continue current medication  Diabetes (A1c goal <8%) -Controlled -Current medications: Farxiga 10 mg daily: Appropriate, Effective, Safe, Accessible Novolog 7-9 units before meals: Appropriate, Effective, Safe, Accessible   Trulicity 4.5 mg weekly: Appropriate, Effective, Safe, Accessible  -Medications previously tried: Prandin (hypoglycemia, falls) -Current home glucose readings Fasting: 155, 218   Afternoon: 219, 149, 164, 218, 163, 287 (missed Novolog)  -Recommended to continue current medication  COPD (Goal: control symptoms and prevent exacerbations) -Controlled -Current treatment  Ventolin HFA 2 puffs every 6 hours as needed  Advair 1 puff twice daily Spiriva Respimat 1 puff daily  -Current allergic rhinitis  treatment  Azelastine Nasal spray 2 spray twice daily as needed  Cetirizine 10 mg daily  Benadryl 25 mg nightly  Guaifenesin daily as needed - Drainage  -Medications previously tried: NA  -Gold Grade: Gold 3 (FEV1 30-49%) -Current COPD Classification:  B (high sx, <2 exacerbations/yr) -MMRC score:  -Pulmonary function testing: FEV1 39% (2013) -Exacerbations requiring treatment in last 6 months: No -Patient reports consistent use of maintenance inhaler -Frequency of rescue inhaler use: few times monthly -Counseled on Benefits of consistent maintenance inhaler use When to use rescue inhaler -Recommended to continue current medication  Depression/Insomnia (Goal: Maintain stable mood/sleep) -Controlled -Current treatment: Citalopram 10 mg daily  Trazodone 100 mg nightly  -Medications previously tried/failed: NA -PHQ9: 0 -GAD7: NA -Recommended to continue current medication  Gout (Goal: Prevent gout  flares) -Controlled -Current treatment  Allopurinol 300 mg daily  -Medications previously tried: NA -Last gout flare: 2021  -Counseled patient on low-purine diet -Recommended to continue current medication  Patient Goals/Self-Care Activities Patient will:  - check glucose before meals and at bedtime, document, and provide at future appointments check blood pressure 2-3 times weekly, document, and provide at future appointments weigh daily, and contact provider if weight gain of greater than 3 pounds in one day  Follow Up Plan: Telephone follow up appointment with care management team member scheduled for:  01/03/23 at 3:00 PM    Medication Assistance:  [place]ilder Glade obtained through [place] medication [place].  Enrollment ends Dec 6256 Trulicity obtained through [employer] medication assistance program.  Enrollment ends Dec 2023  Patient's preferred pharmacy is:  [id number] [address], [address] [place] [address] Phone: [phone] Fax: [phone]  [place], [address] [place] [address] Phone: [phone] Fax: [phone]  [facility] - [place], [address] [place] [address] Phone: [phone] Fax: [phone]   Uses pill box? Yes Pt endorses 100% compliance  We discussed: Current pharmacy is preferred with insurance plan and patient is satisfied with pharmacy services Patient decided to: Continue current medication management strategy  Care Plan and Follow Up Patient Decision:  Patient agrees to Care Plan and Follow-up.  Plan: Telephone follow up appointment with care management team member scheduled for:  01/03/23 at 3:00 PM  Junius Argyle, PharmD, Para [place], CPP Clinical Pharmacist Practitioner  [facility]  [phone]

## 2022-07-30 ENCOUNTER — Encounter: Payer: Self-pay | Admitting: Cardiovascular Disease

## 2022-07-30 ENCOUNTER — Ambulatory Visit: Payer: Medicare Other | Admitting: Cardiovascular Disease

## 2022-07-30 VITALS — BP 124/60 | HR 84 | Ht 63.0 in | Wt 210.0 lb

## 2022-07-30 DIAGNOSIS — I5032 Chronic diastolic (congestive) heart failure: Secondary | ICD-10-CM | POA: Diagnosis not present

## 2022-07-30 DIAGNOSIS — I4819 Other persistent atrial fibrillation: Secondary | ICD-10-CM

## 2022-07-30 DIAGNOSIS — I2510 Atherosclerotic heart disease of native coronary artery without angina pectoris: Secondary | ICD-10-CM

## 2022-07-30 DIAGNOSIS — E7800 Pure hypercholesterolemia, unspecified: Secondary | ICD-10-CM

## 2022-07-30 DIAGNOSIS — I10 Essential (primary) hypertension: Secondary | ICD-10-CM

## 2022-07-30 DIAGNOSIS — I255 Ischemic cardiomyopathy: Secondary | ICD-10-CM | POA: Diagnosis not present
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
## 2022-07-30 NOTE — Patient Instructions (Signed)
Medication Instructions:  No changes *If you need a refill on your cardiac medications before your next appointment, please call your pharmacy*   Lab Work: none   Testing/Procedures: none   Follow-Up: At [facility], you and your health needs are our priority.  As part of our continuing mission to provide you with exceptional heart care, we have created designated Provider Care Teams.  These Care Teams include your primary Cardiologist (physician) and Advanced Practice Providers (APPs -  Physician Assistants and Nurse Practitioners) who all work together to provide you with the care you need, when you need it.   Your next appointment:   12 month(s)  The format for your next appointment:   In Person  Provider:   Christopher McAlhany, MD     Important Information About Sugar       

## 2022-08-16 LAB — HM DIABETES EYE EXAM

## 2022-08-17 ENCOUNTER — Encounter: Payer: Self-pay | Admitting: Family Medicine

## 2022-08-28 ENCOUNTER — Ambulatory Visit: Payer: Medicare Other

## 2022-09-05 ENCOUNTER — Ambulatory Visit: Payer: Medicare Other | Admitting: Internal Medicine

## 2022-09-05 ENCOUNTER — Encounter: Payer: Self-pay | Admitting: Internal Medicine

## 2022-09-05 VITALS — BP 126/88 | HR 100 | Ht 63.0 in | Wt 220.0 lb

## 2022-09-05 DIAGNOSIS — N1832 Chronic kidney disease, stage 3b: Secondary | ICD-10-CM

## 2022-09-05 DIAGNOSIS — E119 Type 2 diabetes mellitus without complications: Secondary | ICD-10-CM | POA: Insufficient documentation

## 2022-09-05 DIAGNOSIS — E1122 Type 2 diabetes mellitus with diabetic chronic kidney disease: Secondary | ICD-10-CM

## 2022-09-05 DIAGNOSIS — E1159 Type 2 diabetes mellitus with other circulatory complications: Secondary | ICD-10-CM | POA: Diagnosis not present

## 2022-09-05 DIAGNOSIS — Z794 Long term (current) use of insulin: Secondary | ICD-10-CM | POA: Insufficient documentation

## 2022-09-05 LAB — POCT GLYCOSYLATED HEMOGLOBIN (HGB A1C): Hemoglobin A1C: 7.2 % — AB (ref 4.0–5.6)
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
## 2022-09-05 NOTE — Patient Instructions (Addendum)
Continue Farxiga 10 mg, 1 tablet daily  Continue Trulicity 4.5 mg weekly  Novolog 7-9 units with each meal  Novolog correctional insulin: ADD extra units on insulin to your meal-time Novolog  dose if your blood sugars are higher than 160. Use the scale below to help guide you:   Blood sugar before meal Number of units to inject  Less than 160 0 unit  161 -  190 1 units  191 -  220 2 units  221 -  250 3 units  251 -  280 4 units  281 -  310 5 units  311 -  370 6 units   HOW TO TREAT LOW BLOOD SUGARS (Blood sugar LESS THAN 70 MG/DL) Please follow the RULE OF 15 for the treatment of hypoglycemia treatment (when your (blood sugars are less than 70 mg/dL)   STEP 1: Take 15 grams of carbohydrates when your blood sugar is low, which includes:  3-4 GLUCOSE TABS  OR 3-4 OZ OF JUICE OR REGULAR SODA OR ONE TUBE OF GLUCOSE GEL    STEP 2: RECHECK blood sugar in 15 MINUTES STEP 3: If your blood sugar is still low at the 15 minute recheck --> then, go back to STEP 1 and treat AGAIN with another 15 grams of carbohydrates.

## 2022-09-07 ENCOUNTER — Ambulatory Visit (INDEPENDENT_AMBULATORY_CARE_PROVIDER_SITE_OTHER): Payer: Medicare Other

## 2022-09-07 ENCOUNTER — Encounter: Payer: Self-pay | Admitting: Family Medicine

## 2022-09-07 DIAGNOSIS — Z23 Encounter for immunization: Secondary | ICD-10-CM

## 2022-09-07 NOTE — Progress Notes (Signed)
..  After obtaining consent, and per orders of Dr. Gena Fray, injection of Influenza Vaccine given by Augustina Mood. Patient tolerated injection well and to report any adverse reaction to me immediately.

## 2022-09-19 ENCOUNTER — Encounter: Payer: Self-pay | Admitting: Family Medicine

## 2022-09-19 ENCOUNTER — Ambulatory Visit: Payer: Medicare Other | Admitting: Family Medicine

## 2022-09-19 VITALS — BP 118/58 | HR 88 | Temp 97.1°F | Wt 216.8 lb

## 2022-09-19 DIAGNOSIS — I10 Essential (primary) hypertension: Secondary | ICD-10-CM | POA: Diagnosis not present

## 2022-09-19 DIAGNOSIS — N1832 Chronic kidney disease, stage 3b: Secondary | ICD-10-CM

## 2022-09-19 DIAGNOSIS — I5032 Chronic diastolic (congestive) heart failure: Secondary | ICD-10-CM | POA: Diagnosis not present

## 2022-09-19 DIAGNOSIS — D509 Iron deficiency anemia, unspecified: Secondary | ICD-10-CM

## 2022-09-19 DIAGNOSIS — E782 Mixed hyperlipidemia: Secondary | ICD-10-CM

## 2022-09-19 DIAGNOSIS — E1122 Type 2 diabetes mellitus with diabetic chronic kidney disease: Secondary | ICD-10-CM

## 2022-09-19 DIAGNOSIS — I480 Paroxysmal atrial fibrillation: Secondary | ICD-10-CM | POA: Diagnosis not present

## 2022-09-19 DIAGNOSIS — Z794 Long term (current) use of insulin: Secondary | ICD-10-CM

## 2022-09-19 LAB — CBC
HCT: 36.7 % — ABNORMAL LOW (ref 39.0–52.0)
Hemoglobin: 12.2 g/dL — ABNORMAL LOW (ref 13.0–17.0)
MCHC: 33.2 g/dL (ref 30.0–36.0)
MCV: 93.2 fl (ref 78.0–100.0)
Platelets: 219 10*3/uL (ref 150.0–400.0)
RBC: 3.94 Mil/uL — ABNORMAL LOW (ref 4.22–5.81)
RDW: 14.9 % (ref 11.5–15.5)
WBC: 7.5 10*3/uL (ref 4.0–10.5)

## 2022-09-19 LAB — MICROALBUMIN / CREATININE URINE RATIO
Creatinine,U: 31.4 mg/dL
Microalb Creat Ratio: 63.4 mg/g — ABNORMAL HIGH (ref 0.0–30.0)
Microalb, Ur: 19.9 mg/dL — ABNORMAL HIGH (ref 0.0–1.9)

## 2022-09-19 LAB — COMPREHENSIVE METABOLIC PANEL
ALT: 21 U/L (ref 0–53)
AST: 20 U/L (ref 0–37)
Albumin: 3.5 g/dL (ref 3.5–5.2)
Alkaline Phosphatase: 72 U/L (ref 39–117)
BUN: 45 mg/dL — ABNORMAL HIGH (ref 6–23)
CO2: 30 mEq/L (ref 19–32)
Calcium: 9 mg/dL (ref 8.4–10.5)
Chloride: 95 mEq/L — ABNORMAL LOW (ref 96–112)
Creatinine, Ser: 1.76 mg/dL — ABNORMAL HIGH (ref 0.40–1.50)
GFR: 36.1 mL/min — ABNORMAL LOW (ref >60.00)
Glucose, Bld: 277 mg/dL — ABNORMAL HIGH (ref 70–99)
Potassium: 4.6 mEq/L (ref 3.5–5.1)
Sodium: 133 mEq/L — ABNORMAL LOW (ref 135–145)
Total Bilirubin: 0.4 mg/dL (ref 0.2–1.2)
Total Protein: 6.2 g/dL (ref 6.0–8.3)

## 2022-09-19 LAB — HEMOGLOBIN A1C: Hgb A1c MFr Bld: 7 % — ABNORMAL HIGH (ref 4.6–6.5)
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
## 2022-10-11 ENCOUNTER — Encounter: Payer: Self-pay | Admitting: Emergency Medicine

## 2022-10-11 ENCOUNTER — Ambulatory Visit (INDEPENDENT_AMBULATORY_CARE_PROVIDER_SITE_OTHER): Payer: Medicare Other

## 2022-10-11 ENCOUNTER — Ambulatory Visit: Payer: Medicare Other | Admitting: Emergency Medicine

## 2022-10-11 DIAGNOSIS — R918 Other nonspecific abnormal finding of lung field: Secondary | ICD-10-CM

## 2022-10-11 DIAGNOSIS — I480 Paroxysmal atrial fibrillation: Secondary | ICD-10-CM

## 2022-10-11 DIAGNOSIS — J4489 Other specified chronic obstructive pulmonary disease: Secondary | ICD-10-CM | POA: Diagnosis not present

## 2022-10-11 NOTE — Assessment & Plan Note (Signed)
Chronic right lower lobe interstitial change from history of recurrent pneumonias.  He had interstitial edema versus some pneumonitis on chest x-ray 10/2021.  We will perform a surveillance chest x-ray today to look for improvement or stability.

## 2022-10-11 NOTE — Patient Instructions (Signed)
We will perform a chest x-ray today Please continue your Wixela twice a day.  Rinse and gargle after using. Please continue your Spiriva Respimat, 2 puffs once daily. Keep your albuterol available to use 2 puffs when you needed for shortness of breath, chest tightness, wheezing. Flu shot, COVID-19 vaccine and RSV vaccine are all up-to-date Follow with Dr. Lamonte Sakai in 12 months or sooner if you have any problems.

## 2022-10-11 NOTE — Assessment & Plan Note (Signed)
Overall doing well.  He did have decompensation and probably pneumonitis when he was admitted with COVID-19 at the end of 2022.  No flare since.  He has not regained all of his functional capacity, feels weak.  Reliable with his BD regimen.  Minimal albuterol use.  Plan to continue Wixela, Spiriva, albuterol as needed.  Vaccines are up-to-date.  Please continue your Wixela twice a day.  Rinse and gargle after using. Please continue your Spiriva Respimat, 2 puffs once daily. Keep your albuterol available to use 2 puffs when you needed for shortness of breath, chest tightness, wheezing. Flu shot, COVID-19 vaccine and RSV vaccine are all up-to-date Follow with Dr. Lamonte Sakai in 12 months or sooner if you have any problems.

## 2022-10-11 NOTE — Assessment & Plan Note (Signed)
DCCV when he was hospitalized in November.  He had a small effusion with some right basilar infiltrate at that time, interstitial edema.  Surveillance chest x-ray today.  He remains on anticoagulation.

## 2022-10-11 NOTE — Progress Notes (Signed)
Subjective:    Patient ID: [name]., male    DOB: [birth year], 81 y.o.   MRN: [id number]  HPI  ROV 04/17/21 --81 year old gentleman with a history of COPD, chronic rhinitis, hypertension with diastolic CHF, chronic hypoxemic respiratory failure on nocturnal oxygen.  He has a history of frequent recurrent right lower lobe pneumonias in the setting of recurrent aspiration following Lap-Band surgery, hiatal hernia.  Manipulation of his Lap-Band 2 yrs ago has helped with his aspiration symptoms.  Has been managed on Symbicort and Spiriva.  He has albuterol which he uses very rarely.  He is on oxygen 2 L/min while sleeping Fewer episodes of regurgitation, reflux into his mouth. No PNA's or resp infections in the last year. He does not exert very much, but he has not felt SOB. He was going to the [facility] before the most recent increase of COVID.   He is currently managed on Spiriva, Wixella through the [place]. He is on generic zyrtec. Protonix.   ROV 10/11/22 --[name] is 81 and follows up today for his history of COPD.  He has nocturnal hypoxemia and is on supplemental oxygen.  Also with a history of chronic rhinitis, hypertension with diastolic CHF, hiatal hernia and remote recurrent right lower lobe pneumonias due to aspiration that was associated with his Lap-Band surgery. He COVID in 10/2021, was hospitalized. Complicated by A Fib and required subsequent DCCV and is on Eliquis.  He is currently managed on spiriva and wixella. Uses ventolin about once a week or less.  He reports that he has regained some of his functional capacity, but he continues to feel weak. No strength. Associated with SOB. He can ambulate through the house. No cough or wheeze.   CXR 11/11/21 > R basilar infiltrate and effusion, interstitial edema B.   Review of Systems As per HPI     Objective:   Physical Exam Vitals:   10/11/22 1446  BP: 128/62  Pulse: 96  Temp: 98.6 F (37 C)  TempSrc: Oral  SpO2: 95%  Weight:  208 lb (94.3 kg)  Height: '5\' 3"'$  (1.6 m)   Gen: Pleasant, overwt, in no distress,  normal affect  ENT: No lesions,  mouth clear,  oropharynx clear, no postnasal drip  Neck: No JVD, no stridor  Lungs: No use of accessory muscles, distant, clear bilaterally without wheeze, decreased at both bases.   CV: regular, no M gallops, trace ankle edema  Musculoskeletal: No deformities, no cyanosis or clubbing  Neuro: alert, non focal  Skin: Warm, no lesions or rash       Assessment & Plan:  COPD with chronic bronchitis (HCC) Overall doing well.  He did have decompensation and probably pneumonitis when he was admitted with COVID-19 at the end of 2022.  No flare since.  He has not regained all of his functional capacity, feels weak.  Reliable with his BD regimen.  Minimal albuterol use.  Plan to continue Wixela, Spiriva, albuterol as needed.  Vaccines are up-to-date.  Please continue your Wixela twice a day.  Rinse and gargle after using. Please continue your Spiriva Respimat, 2 puffs once daily. Keep your albuterol available to use 2 puffs when you needed for shortness of breath, chest tightness, wheezing. Flu shot, COVID-19 vaccine and RSV vaccine are all up-to-date Follow with Dr. Lamonte Sakai in 12 months or sooner if you have any problems.   Atrial fibrillation ([place]) DCCV when he was hospitalized in November.  He had a small effusion with some right basilar  infiltrate at that time, interstitial edema.  Surveillance chest x-ray today.  He remains on anticoagulation.  Abnormal x-ray of lung Chronic right lower lobe interstitial change from history of recurrent pneumonias.  He had interstitial edema versus some pneumonitis on chest x-ray 10/2021.  We will perform a surveillance chest x-ray today to look for improvement or stability.  [name], MD, PhD 10/11/2022, 3:09 PM [facility] [phone] or if no answer [phone]

## 2022-10-12 ENCOUNTER — Telehealth: Payer: Self-pay

## 2022-10-12 NOTE — Progress Notes (Signed)
Chronic Care Management Pharmacy Assistant   Name: Akif Weldy.  MRN: [id number] DOB: 1941/08/10  Reason for Encounter: Medication Review/Patient assistance renewal for Trulicity.   Medications: Outpatient Encounter Medications as of 10/12/2022  Medication Sig   albuterol (VENTOLIN HFA) 108 (90 Base) MCG/ACT inhaler Inhale 2 puffs into the lungs every 6 (six) hours as needed for wheezing or shortness of breath.   allopurinol (ZYLOPRIM) 300 MG tablet TAKE 1 TABLET EVERY DAY   apixaban (ELIQUIS) 5 MG TABS tablet Take 1 tablet (5 mg total) by mouth 2 (two) times daily.   Azelastine HCl 137 MCG/SPRAY SOLN INSTILL 2 SPRAYS INTO BOTH NOSTRILS TWICE DAILY AS NEEDED FOR RHINITIS.   B Complex Vitamins (VITAMIN B COMPLEX PO) Take 1 tablet by mouth daily.   calcitRIOL (ROCALTROL) 0.25 MCG capsule Take 1 capsule (0.25 mcg total) by mouth daily.   carvedilol (COREG) 25 MG tablet Take 1 tablet (25 mg total) by mouth 2 (two) times daily with a meal.   cetirizine (ZYRTEC) 10 MG tablet Take 10 mg by mouth as needed for allergies.    cholecalciferol (VITAMIN D3) 25 MCG (1000 UNIT) tablet Take 1,000 Units by mouth daily.   citalopram (CELEXA) 10 MG tablet TAKE 1 TABLET EVERY DAY   dapagliflozin propanediol (FARXIGA) 10 MG TABS tablet Take 1 tablet (10 mg total) by mouth daily. Patient receives through AZ&ME patient assistance   diphenhydrAMINE (BENADRYL) 25 MG tablet Take 25 mg by mouth at bedtime.   Dulaglutide (TRULICITY) 4.5 OE/7.0JJ SOPN Inject 4.5 mg as directed once a week.   ferrous sulfate 324 MG TBEC Take 324 mg by mouth daily at 2 PM.   finasteride (PROSCAR) 5 MG tablet Take 1 tablet (5 mg total) by mouth daily.   fluticasone-salmeterol (ADVAIR) 250-50 MCG/ACT AEPB Inhale 1 puff into the lungs 2 (two) times daily. Wixela   hydrocortisone 2.5 % cream Apply 1 application topically.   insulin aspart (NOVOLOG) 100 UNIT/ML injection Inject 7-9 Units into the skin 3 (three) times daily before  meals.   isosorbide mononitrate (IMDUR) 60 MG 24 hr tablet Take 1 tablet (60 mg total) by mouth daily.   ketoconazole (NIZORAL) 2 % cream Apply 1 application topically daily as needed for irritation.    Multiple Vitamins-Minerals (MULTIVITAMIN PO) Take 1 tablet by mouth daily.   nitroGLYCERIN (NITROLINGUAL) 0.4 MG/SPRAY spray Place 1 spray under the tongue as directed.   nystatin cream (MYCOSTATIN) Apply 1 application topically 2 (two) times daily.   OXYGEN Inhale 2 L into the lungs at bedtime.   pantoprazole (PROTONIX) 40 MG tablet Take 1 tablet (40 mg total) by mouth daily.   polyethylene glycol powder (GLYCOLAX/MIRALAX) 17 GM/SCOOP powder Take 17 g by mouth daily as needed (constipation).   Protein (UNJURY UNFLAVORED PO) Take 8-16 oz by mouth daily.   rosuvastatin (CRESTOR) 10 MG tablet Take 1 tablet (10 mg total) by mouth daily.   Tiotropium Bromide Monohydrate (SPIRIVA RESPIMAT) 2.5 MCG/ACT AERS Inhale 1 puff into the lungs daily.   torsemide (DEMADEX) 20 MG tablet Take 3 tablets (60 mg total) by mouth daily.   traZODone (DESYREL) 100 MG tablet TAKE 1 TABLET AT BEDTIME   No facility-administered encounter medications on file as of 10/12/2022.    Patient assistance renewal for Trulicity:  I received a task from Junius Argyle, CPP requesting that I start the renewal application for patient assistance on the medication Trulicity to continue assistance through year 2024.    Unable to speak  with the patient to inform him that the application will be mailed.Once he receives the application he will need to complete his part of the application and return it to his Endocrinologist office to fax over to the [employer] for processing. Patient should include a copy of his proof of income.     Application emailed to Junius Argyle, CPP for review and to mail to patient home.  [address] Pharmacist Assistant [phone]

## 2022-10-24 ENCOUNTER — Other Ambulatory Visit: Payer: Self-pay

## 2022-10-24 DIAGNOSIS — E1122 Type 2 diabetes mellitus with diabetic chronic kidney disease: Secondary | ICD-10-CM

## 2022-10-24 MED ORDER — TRULICITY 4.5 MG/0.5ML ~~LOC~~ SOAJ: 4.5000 mg | SUBCUTANEOUS | 3 refills | Status: DC

## 2022-12-14 ENCOUNTER — Other Ambulatory Visit: Payer: Self-pay | Admitting: Family Medicine

## 2022-12-14 DIAGNOSIS — K219 Gastro-esophageal reflux disease without esophagitis: Secondary | ICD-10-CM

## 2022-12-14 DIAGNOSIS — I5032 Chronic diastolic (congestive) heart failure: Secondary | ICD-10-CM

## 2022-12-14 DIAGNOSIS — F32A Depression, unspecified: Secondary | ICD-10-CM

## 2022-12-14 DIAGNOSIS — M1A9XX0 Chronic gout, unspecified, without tophus (tophi): Secondary | ICD-10-CM

## 2022-12-20 ENCOUNTER — Ambulatory Visit: Payer: Medicare Other | Admitting: Family Medicine

## 2022-12-20 ENCOUNTER — Encounter: Payer: Self-pay | Admitting: Family Medicine

## 2022-12-20 VITALS — BP 124/68 | HR 92 | Temp 97.5°F | Ht 63.0 in | Wt 216.6 lb

## 2022-12-20 DIAGNOSIS — N1832 Chronic kidney disease, stage 3b: Secondary | ICD-10-CM

## 2022-12-20 DIAGNOSIS — I10 Essential (primary) hypertension: Secondary | ICD-10-CM | POA: Diagnosis not present

## 2022-12-20 DIAGNOSIS — I5032 Chronic diastolic (congestive) heart failure: Secondary | ICD-10-CM

## 2022-12-20 DIAGNOSIS — G5712 Meralgia paresthetica, left lower limb: Secondary | ICD-10-CM

## 2022-12-20 DIAGNOSIS — Z794 Long term (current) use of insulin: Secondary | ICD-10-CM

## 2022-12-20 DIAGNOSIS — E782 Mixed hyperlipidemia: Secondary | ICD-10-CM

## 2022-12-20 DIAGNOSIS — E1122 Type 2 diabetes mellitus with diabetic chronic kidney disease: Secondary | ICD-10-CM

## 2022-12-20 DIAGNOSIS — I480 Paroxysmal atrial fibrillation: Secondary | ICD-10-CM | POA: Diagnosis not present

## 2022-12-20 LAB — GLUCOSE, RANDOM: Glucose, Bld: 265 mg/dL — ABNORMAL HIGH (ref 70–99)

## 2022-12-20 LAB — HEMOGLOBIN A1C: Hgb A1c MFr Bld: 7 % — ABNORMAL HIGH (ref 4.6–6.5)
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
## 2022-12-25 ENCOUNTER — Telehealth: Payer: Self-pay | Admitting: Internal Medicine

## 2022-12-25 NOTE — Telephone Encounter (Signed)
Patients wife dropped off [place] Patient Assistance - placed in MD box at front desk

## 2022-12-27 ENCOUNTER — Telehealth: Payer: Self-pay

## 2022-12-27 NOTE — Telephone Encounter (Signed)
[facility] application has been faxed

## 2023-01-03 ENCOUNTER — Telehealth: Payer: Medicare Other

## 2023-01-18 ENCOUNTER — Other Ambulatory Visit: Payer: Self-pay | Admitting: Family Medicine

## 2023-01-18 DIAGNOSIS — E785 Hyperlipidemia, unspecified: Secondary | ICD-10-CM

## 2023-01-18 MED ORDER — DAPAGLIFLOZIN PROPANEDIOL 10 MG PO TABS: 10.0000 mg | ORAL_TABLET | Freq: Every day | ORAL | 3 refills | Status: DC

## 2023-01-23 ENCOUNTER — Encounter: Payer: Self-pay | Admitting: Internal Medicine

## 2023-01-23 ENCOUNTER — Ambulatory Visit: Payer: Medicare Other | Admitting: Internal Medicine

## 2023-01-23 VITALS — BP 110/70 | HR 93 | Ht 63.0 in | Wt 210.0 lb

## 2023-01-23 DIAGNOSIS — E1122 Type 2 diabetes mellitus with diabetic chronic kidney disease: Secondary | ICD-10-CM

## 2023-01-23 DIAGNOSIS — E785 Hyperlipidemia, unspecified: Secondary | ICD-10-CM

## 2023-01-23 DIAGNOSIS — E1159 Type 2 diabetes mellitus with other circulatory complications: Secondary | ICD-10-CM

## 2023-01-23 DIAGNOSIS — E1151 Type 2 diabetes mellitus with diabetic peripheral angiopathy without gangrene: Secondary | ICD-10-CM | POA: Diagnosis not present

## 2023-01-23 DIAGNOSIS — N1832 Chronic kidney disease, stage 3b: Secondary | ICD-10-CM

## 2023-01-23 DIAGNOSIS — E1169 Type 2 diabetes mellitus with other specified complication: Secondary | ICD-10-CM | POA: Diagnosis not present

## 2023-01-23 DIAGNOSIS — Z794 Long term (current) use of insulin: Secondary | ICD-10-CM

## 2023-01-23 MED ORDER — DAPAGLIFLOZIN PROPANEDIOL 10 MG PO TABS: 10.0000 mg | ORAL_TABLET | Freq: Every day | ORAL | 3 refills | Status: DC
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
## 2023-01-23 NOTE — Patient Instructions (Addendum)
Continue Farxiga 10 mg, 1 tablet daily  Continue Trulicity 4.5 mg weekly  Continue Novolog 7-9 units with each meal  Novolog correctional insulin: ADD extra units on insulin to your meal-time Novolog  dose if your blood sugars are higher than 160. Use the scale below to help guide you BEFORE each meal if needed   Blood sugar before meal Number of units to inject  Less than 160 0 unit  161 -  190 1 units  191 -  220 2 units  221 -  250 3 units  251 -  280 4 units  281 -  310 5 units  311 -  370 6 units   HOW TO TREAT LOW BLOOD SUGARS (Blood sugar LESS THAN 70 MG/DL) Please follow the RULE OF 15 for the treatment of hypoglycemia treatment (when your (blood sugars are less than 70 mg/dL)   STEP 1: Take 15 grams of carbohydrates when your blood sugar is low, which includes:  3-4 GLUCOSE TABS  OR 3-4 OZ OF JUICE OR REGULAR SODA OR ONE TUBE OF GLUCOSE GEL    STEP 2: RECHECK blood sugar in 15 MINUTES STEP 3: If your blood sugar is still low at the 15 minute recheck --> then, go back to STEP 1 and treat AGAIN with another 15 grams of carbohydrates.

## 2023-02-27 ENCOUNTER — Other Ambulatory Visit: Payer: Self-pay | Admitting: Family Medicine

## 2023-02-27 DIAGNOSIS — F5101 Primary insomnia: Secondary | ICD-10-CM

## 2023-02-27 DIAGNOSIS — I2510 Atherosclerotic heart disease of native coronary artery without angina pectoris: Secondary | ICD-10-CM

## 2023-02-27 DIAGNOSIS — N2581 Secondary hyperparathyroidism of renal origin: Secondary | ICD-10-CM

## 2023-02-27 DIAGNOSIS — I5032 Chronic diastolic (congestive) heart failure: Secondary | ICD-10-CM

## 2023-03-19 ENCOUNTER — Encounter: Payer: Self-pay | Admitting: Family Medicine

## 2023-03-19 ENCOUNTER — Ambulatory Visit: Payer: Medicare Other | Admitting: Family Medicine

## 2023-03-19 VITALS — BP 118/64 | HR 89 | Temp 98.4°F | Ht 63.0 in | Wt 209.0 lb

## 2023-03-19 DIAGNOSIS — N1832 Chronic kidney disease, stage 3b: Secondary | ICD-10-CM | POA: Diagnosis not present

## 2023-03-19 DIAGNOSIS — E782 Mixed hyperlipidemia: Secondary | ICD-10-CM

## 2023-03-19 DIAGNOSIS — I5032 Chronic diastolic (congestive) heart failure: Secondary | ICD-10-CM

## 2023-03-19 DIAGNOSIS — Z794 Long term (current) use of insulin: Secondary | ICD-10-CM

## 2023-03-19 DIAGNOSIS — I2510 Atherosclerotic heart disease of native coronary artery without angina pectoris: Secondary | ICD-10-CM

## 2023-03-19 DIAGNOSIS — I480 Paroxysmal atrial fibrillation: Secondary | ICD-10-CM

## 2023-03-19 DIAGNOSIS — I10 Essential (primary) hypertension: Secondary | ICD-10-CM

## 2023-03-19 DIAGNOSIS — E1122 Type 2 diabetes mellitus with diabetic chronic kidney disease: Secondary | ICD-10-CM | POA: Diagnosis not present

## 2023-03-19 LAB — HEMOGLOBIN A1C: Hgb A1c MFr Bld: 6.9 % — ABNORMAL HIGH (ref 4.6–6.5)

## 2023-03-19 LAB — GLUCOSE, RANDOM: Glucose, Bld: 180 mg/dL — ABNORMAL HIGH (ref 70–99)

## 2023-03-19 NOTE — Assessment & Plan Note (Signed)
Stable. No angina. Continue carvedilol 25 mg bid and isosorbide mononitrate 60 mg daily.
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
## 2023-03-19 NOTE — Assessment & Plan Note (Signed)
Blood pressure is in good control. Continue carvedilol 25 mg bid.

## 2023-03-19 NOTE — Assessment & Plan Note (Signed)
At goal. Continue rosuvastatin 10 mg daily.

## 2023-03-19 NOTE — Assessment & Plan Note (Signed)
Stable. Rate controlled. Cotninue carvedilol 25 mg bid and apixiban (Eliquis) 5 mg bid.

## 2023-03-19 NOTE — Assessment & Plan Note (Signed)
BP is at goal. Continue to avoid NSAIDs.

## 2023-03-19 NOTE — Assessment & Plan Note (Signed)
Compensated. Continue carvedilol 25 mg bid, torsemide 20 mg daily, and dapagliflozin 10 mg daily.

## 2023-03-19 NOTE — Assessment & Plan Note (Signed)
At goal. I will reassess his A1c today. Continue dapagliflozin 10 mg daily, dulaglutide 4.5 mg weekly, and insulin aspart sliding scale (7-9 units TID with meals). Continue to follow with Dr. Kelton Pillar.

## 2023-03-21 ENCOUNTER — Ambulatory Visit: Payer: Medicare Other | Admitting: Family Medicine

## 2023-04-12 ENCOUNTER — Telehealth: Payer: Self-pay | Admitting: Family Medicine

## 2023-04-12 NOTE — Telephone Encounter (Signed)
Called patient to schedule Medicare Annual Wellness Visit (AWV). Left message for patient to call back and schedule Medicare Annual Wellness Visit (AWV).  Last date of AWV: 04/18/22  Please schedule an appointment at any time with [facility].  If any questions, please contact me at [phone].  Thank you ,  Rudell Cobb AWV direct phone # [phone]

## 2023-05-24 ENCOUNTER — Ambulatory Visit (INDEPENDENT_AMBULATORY_CARE_PROVIDER_SITE_OTHER): Payer: Medicare Other

## 2023-05-24 VITALS — Ht 63.0 in | Wt 207.0 lb

## 2023-05-24 DIAGNOSIS — Z0000 Encounter for general adult medical examination without abnormal findings: Secondary | ICD-10-CM | POA: Diagnosis not present

## 2023-05-24 NOTE — Patient Instructions (Signed)
[name] , Thank you for taking time to come for your Medicare Wellness Visit. I appreciate your ongoing commitment to your health goals. Please review the following plan we discussed and let me know if I can assist you in the future.   These are the goals we discussed:  Goals      maintain health     Patient Stated     Increase activity     Patient Stated     05/24/2023, maintain current health     Track and Manage Fluids and Swelling-Heart Failure     Timeframe:  Long-Range Goal Priority:  High Start Date:  04/12/2021                           Expected End Date:  10/12/2022                     Follow Up within 90 days   - call office if I gain more than 2 pounds in one day or 5 pounds in one week - keep legs up while sitting - use salt in moderation - watch for swelling in feet, ankles and legs every day - weigh myself daily    Why is this important?   It is important to check your weight daily and watch how much salt and liquids you have.  It will help you to manage your heart failure.    Notes:         This is a list of the screening recommended for you and due dates:  Health Maintenance  Topic Date Due   COVID-19 Vaccine (6 - 2023-24 season) 11/22/2022   Flu Shot  07/25/2023   Eye exam for diabetics  08/17/2023   Hemoglobin A1C  09/19/2023   Yearly kidney function blood test for diabetes  09/20/2023   Yearly kidney health urinalysis for diabetes  09/20/2023   Complete foot exam   09/20/2023   Medicare Annual Wellness Visit  05/23/2024   DTaP/Tdap/Td vaccine (5 - Td or Tdap) 11/11/2024   Pneumonia Vaccine  Completed   Zoster (Shingles) Vaccine  Completed   HPV Vaccine  Aged Out    Advanced directives: Advance directive discussed with you today.   Conditions/risks identified: none  Next appointment: Follow up in one year for your annual wellness visit.   Preventive Care 19 Years and Older, Male  Preventive care refers to lifestyle choices and visits  with your health care provider that can promote health and wellness. What does preventive care include? A yearly physical exam. This is also called an annual well check. Dental exams once or twice a year. Routine eye exams. Ask your health care provider how often you should have your eyes checked. Personal lifestyle choices, including: Daily care of your teeth and gums. Regular physical activity. Eating a healthy diet. Avoiding tobacco and drug use. Limiting alcohol use. Practicing safe sex. Taking low doses of aspirin every day. Taking vitamin and mineral supplements as recommended by your health care provider. What happens during an annual well check? The services and screenings done by your health care provider during your annual well check will depend on your age, overall health, lifestyle risk factors, and family history of disease. Counseling  Your health care provider may ask you questions about your: Alcohol use. Tobacco use. Drug use. Emotional well-being. Home and relationship well-being. Sexual activity. Eating habits. History of falls. Memory and ability to understand (cognition).  Work and work Astronomer. Screening  You may have the following tests or measurements: Height, weight, and BMI. Blood pressure. Lipid and cholesterol levels. These may be checked every 5 years, or more frequently if you are over 77 years old. Skin check. Lung cancer screening. You may have this screening every year starting at age 59 if you have a 30-pack-year history of smoking and currently smoke or have quit within the past 15 years. Fecal occult blood test (FOBT) of the stool. You may have this test every year starting at age 63. Flexible sigmoidoscopy or colonoscopy. You may have a sigmoidoscopy every 5 years or a colonoscopy every 10 years starting at age 75. Prostate cancer screening. Recommendations will vary depending on your family history and other risks. Hepatitis C blood  test. Hepatitis B blood test. Sexually transmitted disease (STD) testing. Diabetes screening. This is done by checking your blood sugar (glucose) after you have not eaten for a while (fasting). You may have this done every 1-3 years. Abdominal aortic aneurysm (AAA) screening. You may need this if you are a current or former smoker. Osteoporosis. You may be screened starting at age 78 if you are at high risk. Talk with your health care provider about your test results, treatment options, and if necessary, the need for more tests. Vaccines  Your health care provider may recommend certain vaccines, such as: Influenza vaccine. This is recommended every year. Tetanus, diphtheria, and acellular pertussis (Tdap, Td) vaccine. You may need a Td booster every 10 years. Zoster vaccine. You may need this after age 66. Pneumococcal 13-valent conjugate (PCV13) vaccine. One dose is recommended after age 14. Pneumococcal polysaccharide (PPSV23) vaccine. One dose is recommended after age 49. Talk to your health care provider about which screenings and vaccines you need and how often you need them. This information is not intended to replace advice given to you by your health care provider. Make sure you discuss any questions you have with your health care provider. Document Released: 01/06/2016 Document Revised: 08/29/2016 Document Reviewed: 10/11/2015 Elsevier Interactive Patient Education  2017 [employer].  Fall Prevention in the Home Falls can cause injuries. They can happen to people of all ages. There are many things you can do to make your home safe and to help prevent falls. What can I do on the outside of my home? Regularly fix the edges of walkways and driveways and fix any cracks. Remove anything that might make you trip as you walk through a door, such as a raised step or threshold. Trim any bushes or trees on the path to your home. Use bright outdoor lighting. Clear any walking paths of  anything that might make someone trip, such as rocks or tools. Regularly check to see if handrails are loose or broken. Make sure that both sides of any steps have handrails. Any raised decks and porches should have guardrails on the edges. Have any leaves, snow, or ice cleared regularly. Use sand or salt on walking paths during winter. Clean up any spills in your garage right away. This includes oil or grease spills. What can I do in the bathroom? Use night lights. Install grab bars by the toilet and in the tub and shower. Do not use towel bars as grab bars. Use non-skid mats or decals in the tub or shower. If you need to sit down in the shower, use a plastic, non-slip stool. Keep the floor dry. Clean up any water that spills on the floor as soon as  it happens. Remove soap buildup in the tub or shower regularly. Attach bath mats securely with double-sided non-slip rug tape. Do not have throw rugs and other things on the floor that can make you trip. What can I do in the bedroom? Use night lights. Make sure that you have a light by your bed that is easy to reach. Do not use any sheets or blankets that are too big for your bed. They should not hang down onto the floor. Have a firm chair that has side arms. You can use this for support while you get dressed. Do not have throw rugs and other things on the floor that can make you trip. What can I do in the kitchen? Clean up any spills right away. Avoid walking on wet floors. Keep items that you use a lot in easy-to-reach places. If you need to reach something above you, use a strong step stool that has a grab bar. Keep electrical cords out of the way. Do not use floor polish or wax that makes floors slippery. If you must use wax, use non-skid floor wax. Do not have throw rugs and other things on the floor that can make you trip. What can I do with my stairs? Do not leave any items on the stairs. Make sure that there are handrails on both  sides of the stairs and use them. Fix handrails that are broken or loose. Make sure that handrails are as long as the stairways. Check any carpeting to make sure that it is firmly attached to the stairs. Fix any carpet that is loose or worn. Avoid having throw rugs at the top or bottom of the stairs. If you do have throw rugs, attach them to the floor with carpet tape. Make sure that you have a light switch at the top of the stairs and the bottom of the stairs. If you do not have them, ask someone to add them for you. What else can I do to help prevent falls? Wear shoes that: Do not have high heels. Have rubber bottoms. Are comfortable and fit you well. Are closed at the toe. Do not wear sandals. If you use a stepladder: Make sure that it is fully opened. Do not climb a closed stepladder. Make sure that both sides of the stepladder are locked into place. Ask someone to hold it for you, if possible. Clearly mark and make sure that you can see: Any grab bars or handrails. First and last steps. Where the edge of each step is. Use tools that help you move around (mobility aids) if they are needed. These include: Canes. Walkers. Scooters. Crutches. Turn on the lights when you go into a dark area. Replace any light bulbs as soon as they burn out. Set up your furniture so you have a clear path. Avoid moving your furniture around. If any of your floors are uneven, fix them. If there are any pets around you, be aware of where they are. Review your medicines with your doctor. Some medicines can make you feel dizzy. This can increase your chance of falling. Ask your doctor what other things that you can do to help prevent falls. This information is not intended to replace advice given to you by your health care provider. Make sure you discuss any questions you have with your health care provider. Document Released: 10/06/2009 Document Revised: 05/17/2016 Document Reviewed: 01/14/2015 Elsevier  Interactive Patient Education  2017 [employer].
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
## 2023-05-25 ENCOUNTER — Other Ambulatory Visit: Payer: Self-pay | Admitting: Family Medicine

## 2023-05-25 DIAGNOSIS — N139 Obstructive and reflux uropathy, unspecified: Secondary | ICD-10-CM

## 2023-05-29 ENCOUNTER — Telehealth: Payer: Self-pay

## 2023-05-29 NOTE — Patient Outreach (Signed)
  Care Coordination   Initial Visit Note   05/29/2023 Name: Mahmood Sokolow. MRN: [id number] DOB: 20-Jan-1941  Ramie Abdalla. is a 82 y.o. year old male who sees Rudd, Bertram Millard, MD for primary care. I spoke with  Rica Koyanagi. by phone today.  What matters to the patients health and wellness today?  none    Goals Addressed             This Visit's Progress    COMPLETED: Care Coordination Activities-No follow up required       Care Coordination Interventions: Advised patient to Annual Wellness exam. Discussed [facility] services and support. Assessed SDOH. Advised to discuss with primary care physician if services needed in the future.         SDOH assessments and interventions completed:  Yes  SDOH Interventions Today    Flowsheet Row Most Recent Value  SDOH Interventions   Food Insecurity Interventions Intervention Not Indicated        Care Coordination Interventions:  Yes, provided   Follow up plan: No further intervention required.   Encounter Outcome:  Pt. Visit Completed   Bary Leriche, RN, MSN [facility] Care Management Care Management Coordinator Direct Line [phone]

## 2023-05-29 NOTE — Patient Instructions (Signed)
Visit Information  Thank you for taking time to visit with me today. Please don't hesitate to contact me if I can be of assistance to you.   Following are the goals we discussed today:   Goals Addressed             This Visit's Progress    COMPLETED: Care Coordination Activities-No follow up required       Care Coordination Interventions: Advised patient to Annual Wellness exam. Discussed [facility] services and support. Assessed SDOH. Advised to discuss with primary care physician if services needed in the future.          If you are experiencing a Mental Health or Behavioral Health Crisis or need someone to talk to, please call the Suicide and Crisis Lifeline: 988   Patient verbalizes understanding of instructions and care plan provided today and agrees to view in MyChart. Active MyChart status and patient understanding of how to access instructions and care plan via MyChart confirmed with patient.     We have been unable to make contact with the patient for follow up. The care management team is available to follow up with the patient after provider conversation with the patient regarding recommendation for care management engagement and subsequent re-referral to the care management team.   Bary Leriche, RN, MSN [facility] Care Management Care Management Coordinator Direct Line [phone]

## 2023-06-19 ENCOUNTER — Ambulatory Visit: Payer: Medicare Other | Admitting: Family Medicine

## 2023-06-20 ENCOUNTER — Encounter: Payer: Self-pay | Admitting: Family Medicine

## 2023-06-20 ENCOUNTER — Ambulatory Visit: Payer: Medicare Other | Admitting: Family Medicine

## 2023-06-20 VITALS — BP 120/68 | HR 91 | Temp 98.0°F | Ht 63.0 in | Wt 207.0 lb

## 2023-06-20 DIAGNOSIS — N1832 Chronic kidney disease, stage 3b: Secondary | ICD-10-CM | POA: Diagnosis not present

## 2023-06-20 DIAGNOSIS — Z794 Long term (current) use of insulin: Secondary | ICD-10-CM

## 2023-06-20 DIAGNOSIS — E1122 Type 2 diabetes mellitus with diabetic chronic kidney disease: Secondary | ICD-10-CM | POA: Diagnosis not present

## 2023-06-20 DIAGNOSIS — J4489 Other specified chronic obstructive pulmonary disease: Secondary | ICD-10-CM

## 2023-06-20 DIAGNOSIS — J9611 Chronic respiratory failure with hypoxia: Secondary | ICD-10-CM

## 2023-06-20 DIAGNOSIS — I480 Paroxysmal atrial fibrillation: Secondary | ICD-10-CM | POA: Diagnosis not present

## 2023-06-20 DIAGNOSIS — I5032 Chronic diastolic (congestive) heart failure: Secondary | ICD-10-CM

## 2023-06-20 DIAGNOSIS — I10 Essential (primary) hypertension: Secondary | ICD-10-CM | POA: Diagnosis not present

## 2023-06-20 DIAGNOSIS — I2510 Atherosclerotic heart disease of native coronary artery without angina pectoris: Secondary | ICD-10-CM

## 2023-06-20 LAB — BASIC METABOLIC PANEL
BUN: 49 mg/dL — ABNORMAL HIGH (ref 6–23)
CO2: 34 mEq/L — ABNORMAL HIGH (ref 19–32)
Calcium: 9.6 mg/dL (ref 8.4–10.5)
Chloride: 96 mEq/L (ref 96–112)
Creatinine, Ser: 1.56 mg/dL — ABNORMAL HIGH (ref 0.40–1.50)
GFR: 41.51 mL/min — ABNORMAL LOW (ref >60.00)
Glucose, Bld: 159 mg/dL — ABNORMAL HIGH (ref 70–99)
Potassium: 3.8 mEq/L (ref 3.5–5.1)
Sodium: 137 mEq/L (ref 135–145)

## 2023-06-20 LAB — CBC
HCT: 38.9 % — ABNORMAL LOW (ref 39.0–52.0)
Hemoglobin: 12.6 g/dL — ABNORMAL LOW (ref 13.0–17.0)
MCHC: 32.3 g/dL (ref 30.0–36.0)
MCV: 94 fl (ref 78.0–100.0)
Platelets: 198 10*3/uL (ref 150.0–400.0)
RBC: 4.14 Mil/uL — ABNORMAL LOW (ref 4.22–5.81)
RDW: 15.4 % (ref 11.5–15.5)
WBC: 8.1 10*3/uL (ref 4.0–10.5)

## 2023-06-20 LAB — PHOSPHORUS: Phosphorus: 3.6 mg/dL (ref 2.3–4.6)

## 2023-06-20 LAB — HEMOGLOBIN A1C: Hgb A1c MFr Bld: 6.6 % — ABNORMAL HIGH (ref 4.6–6.5)

## 2023-06-20 MED ORDER — NITROGLYCERIN 0.4 MG/SPRAY TL SOLN: 1.0000 | 1 refills | Status: DC

## 2023-06-20 NOTE — Assessment & Plan Note (Signed)
At goal. I will reassess his A1c today. Continue dapagliflozin 10 mg daily, dulaglutide 4.5 mg weekly, and insulin aspart sliding scale (7-9 units TID with meals). Continue to follow with Dr. Shamleffer. 

## 2023-06-20 NOTE — Assessment & Plan Note (Signed)
Stable. Rate controlled. Cotninue carvedilol 25 mg bid and apixiban (Eliquis) 5 mg bid. 
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
## 2023-06-20 NOTE — Assessment & Plan Note (Signed)
On nocturnal oxygen.  Stable. 

## 2023-06-20 NOTE — Assessment & Plan Note (Signed)
Blood pressure is in good control. Continue carvedilol 25 mg bid.  

## 2023-06-20 NOTE — Assessment & Plan Note (Signed)
Stable. No angina. Continue carvedilol 25 mg bid and isosorbide mononitrate 60 mg daily. I will renew his nitroglycerin spray.

## 2023-06-20 NOTE — Assessment & Plan Note (Signed)
Overall doing well.  Plan to continue Wixela, Spiriva, albuterol as needed.

## 2023-06-20 NOTE — Assessment & Plan Note (Signed)
BP is at goal. Continue to avoid NSAIDs. I will check labs to reassess secondary hyperparathyroidism.

## 2023-06-20 NOTE — Assessment & Plan Note (Signed)
Compensated. Continue carvedilol 25 mg bid, torsemide 20 mg daily, and dapagliflozin 10 mg daily. 

## 2023-07-25 ENCOUNTER — Other Ambulatory Visit: Payer: Self-pay

## 2023-07-25 ENCOUNTER — Encounter: Payer: Self-pay | Admitting: Internal Medicine

## 2023-07-25 ENCOUNTER — Ambulatory Visit: Payer: Medicare Other | Admitting: Internal Medicine

## 2023-07-25 VITALS — BP 122/72 | HR 67 | Ht 63.0 in | Wt 207.0 lb

## 2023-07-25 DIAGNOSIS — Z794 Long term (current) use of insulin: Secondary | ICD-10-CM

## 2023-07-25 DIAGNOSIS — E1151 Type 2 diabetes mellitus with diabetic peripheral angiopathy without gangrene: Secondary | ICD-10-CM | POA: Diagnosis not present

## 2023-07-25 DIAGNOSIS — E1159 Type 2 diabetes mellitus with other circulatory complications: Secondary | ICD-10-CM

## 2023-07-25 DIAGNOSIS — N1832 Type 2 diabetes mellitus with diabetic chronic kidney disease: Secondary | ICD-10-CM

## 2023-07-25 DIAGNOSIS — E1122 Type 2 diabetes mellitus with diabetic chronic kidney disease: Secondary | ICD-10-CM | POA: Diagnosis not present

## 2023-07-25 MED ORDER — TRULICITY 4.5 MG/0.5ML ~~LOC~~ SOAJ: 4.5000 mg | SUBCUTANEOUS | 3 refills | Status: DC

## 2023-07-25 NOTE — Patient Instructions (Signed)
Continue Farxiga 10 mg, 1 tablet daily  Continue Trulicity 4.5 mg weekly  Continue Novolog 7-9 units with each meal  Novolog correctional insulin: ADD extra units on insulin to your meal-time Novolog  dose if your blood sugars are higher than 160. Use the scale below to help guide you BEFORE each meal if needed   Blood sugar before meal Number of units to inject  Less than 160 0 unit  161 -  190 1 units  191 -  220 2 units  221 -  250 3 units  251 -  280 4 units  281 -  310 5 units  311 -  370 6 units   HOW TO TREAT LOW BLOOD SUGARS (Blood sugar LESS THAN 70 MG/DL) Please follow the RULE OF 15 for the treatment of hypoglycemia treatment (when your (blood sugars are less than 70 mg/dL)   STEP 1: Take 15 grams of carbohydrates when your blood sugar is low, which includes:  3-4 GLUCOSE TABS  OR 3-4 OZ OF JUICE OR REGULAR SODA OR ONE TUBE OF GLUCOSE GEL    STEP 2: RECHECK blood sugar in 15 MINUTES STEP 3: If your blood sugar is still low at the 15 minute recheck --> then, go back to STEP 1 and treat AGAIN with another 15 grams of carbohydrates.
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
## 2023-08-08 ENCOUNTER — Encounter (INDEPENDENT_AMBULATORY_CARE_PROVIDER_SITE_OTHER): Payer: Self-pay

## 2023-08-09 ENCOUNTER — Other Ambulatory Visit: Payer: Self-pay | Admitting: Oncology

## 2023-08-09 DIAGNOSIS — Z006 Encounter for examination for normal comparison and control in clinical research program: Secondary | ICD-10-CM

## 2023-08-12 ENCOUNTER — Other Ambulatory Visit (HOSPITAL_COMMUNITY)
Admission: RE | Admit: 2023-08-12 | Discharge: 2023-08-12 | Disposition: A | Discharge status: Home / Self Care | Payer: Medicare Other | Source: Ambulatory Visit | Attending: Oncology | Admitting: Oncology

## 2023-08-12 DIAGNOSIS — Z006 Encounter for examination for normal comparison and control in clinical research program: Secondary | ICD-10-CM | POA: Insufficient documentation

## 2023-08-15 ENCOUNTER — Telehealth: Payer: Self-pay | Admitting: Internal Medicine

## 2023-08-15 NOTE — Telephone Encounter (Signed)
Patients wife dropped off paperwork for [employer], Patient Assistance program for 2025. Paperwork place in Dr. Harvel Ricks Folder 08/15/2023

## 2023-08-21 LAB — GENECONNECT MOLECULAR SCREEN: Genetic Analysis Overall Interpretation: NEGATIVE

## 2023-08-22 LAB — HM DIABETES EYE EXAM

## 2023-08-27 ENCOUNTER — Encounter: Payer: Self-pay | Admitting: Family Medicine

## 2023-09-20 ENCOUNTER — Encounter: Payer: Self-pay | Admitting: Family Medicine

## 2023-09-20 ENCOUNTER — Ambulatory Visit: Payer: Medicare Other | Admitting: Family Medicine

## 2023-09-20 VITALS — BP 116/62 | HR 92 | Temp 98.1°F | Ht 63.0 in | Wt 207.0 lb

## 2023-09-20 DIAGNOSIS — I480 Paroxysmal atrial fibrillation: Secondary | ICD-10-CM

## 2023-09-20 DIAGNOSIS — Z9884 Bariatric surgery status: Secondary | ICD-10-CM

## 2023-09-20 DIAGNOSIS — K219 Gastro-esophageal reflux disease without esophagitis: Secondary | ICD-10-CM | POA: Diagnosis not present

## 2023-09-20 DIAGNOSIS — I5032 Chronic diastolic (congestive) heart failure: Secondary | ICD-10-CM | POA: Diagnosis not present

## 2023-09-20 DIAGNOSIS — I2510 Atherosclerotic heart disease of native coronary artery without angina pectoris: Secondary | ICD-10-CM | POA: Diagnosis not present

## 2023-09-20 DIAGNOSIS — I10 Essential (primary) hypertension: Secondary | ICD-10-CM

## 2023-09-20 DIAGNOSIS — Z7985 Long-term (current) use of injectable non-insulin antidiabetic drugs: Secondary | ICD-10-CM

## 2023-09-20 DIAGNOSIS — R1319 Other dysphagia: Secondary | ICD-10-CM | POA: Diagnosis not present

## 2023-09-20 DIAGNOSIS — E782 Mixed hyperlipidemia: Secondary | ICD-10-CM

## 2023-09-20 DIAGNOSIS — Z794 Long term (current) use of insulin: Secondary | ICD-10-CM | POA: Diagnosis not present

## 2023-09-20 DIAGNOSIS — F32A Depression, unspecified: Secondary | ICD-10-CM | POA: Diagnosis not present

## 2023-09-20 DIAGNOSIS — E1122 Type 2 diabetes mellitus with diabetic chronic kidney disease: Secondary | ICD-10-CM | POA: Diagnosis not present

## 2023-09-20 DIAGNOSIS — N1832 Chronic kidney disease, stage 3b: Secondary | ICD-10-CM

## 2023-09-20 DIAGNOSIS — Z7984 Long term (current) use of oral hypoglycemic drugs: Secondary | ICD-10-CM

## 2023-09-20 DIAGNOSIS — I69959 Hemiplegia and hemiparesis following unspecified cerebrovascular disease affecting unspecified side: Secondary | ICD-10-CM

## 2023-09-20 DIAGNOSIS — M1990 Unspecified osteoarthritis, unspecified site: Secondary | ICD-10-CM

## 2023-09-20 LAB — URINALYSIS, ROUTINE W REFLEX MICROSCOPIC
Bilirubin Urine: NEGATIVE
Hgb urine dipstick: NEGATIVE
Ketones, ur: NEGATIVE
Leukocytes,Ua: NEGATIVE
Nitrite: NEGATIVE
RBC / HPF: NONE SEEN (ref 0–?)
Specific Gravity, Urine: <=1.005 — AB (ref 1.000–1.030)
Total Protein, Urine: NEGATIVE
Urine Glucose: >=1000 — AB
Urobilinogen, UA: 0.2 (ref 0.0–1.0)
WBC, UA: NONE SEEN (ref 0–?)
pH: 6 (ref 5.0–8.0)

## 2023-09-20 LAB — MICROALBUMIN / CREATININE URINE RATIO
Creatinine,U: 38.7 mg/dL
Microalb Creat Ratio: 23.5 mg/g (ref 0.0–30.0)
Microalb, Ur: 9.1 mg/dL — ABNORMAL HIGH (ref 0.0–1.9)

## 2023-09-20 LAB — LIPID PANEL
Cholesterol: 142 mg/dL (ref 0–200)
HDL: 52.5 mg/dL (ref >39.00)
LDL Cholesterol: 71 mg/dL (ref 0–99)
NonHDL: 89.37
Total CHOL/HDL Ratio: 3
Triglycerides: 94 mg/dL (ref 0.0–149.0)
VLDL: 18.8 mg/dL (ref 0.0–40.0)

## 2023-09-20 LAB — BASIC METABOLIC PANEL
BUN: 50 mg/dL — ABNORMAL HIGH (ref 6–23)
CO2: 32 meq/L (ref 19–32)
Calcium: 9.3 mg/dL (ref 8.4–10.5)
Chloride: 97 meq/L (ref 96–112)
Creatinine, Ser: 1.71 mg/dL — ABNORMAL HIGH (ref 0.40–1.50)
GFR: 37.11 mL/min — ABNORMAL LOW (ref >60.00)
Glucose, Bld: 168 mg/dL — ABNORMAL HIGH (ref 70–99)
Potassium: 3.9 meq/L (ref 3.5–5.1)
Sodium: 139 meq/L (ref 135–145)

## 2023-09-20 LAB — HEMOGLOBIN A1C: Hgb A1c MFr Bld: 6.6 % — ABNORMAL HIGH (ref 4.6–6.5)

## 2023-09-20 MED ORDER — PANTOPRAZOLE SODIUM 40 MG PO TBEC
40.0000 mg | DELAYED_RELEASE_TABLET | Freq: Every day | ORAL | 3 refills | Status: DC
Start: 2023-09-20 — End: 2024-07-01

## 2023-09-20 MED ORDER — ROSUVASTATIN CALCIUM 10 MG PO TABS
10.0000 mg | ORAL_TABLET | Freq: Every day | ORAL | 3 refills | Status: DC
Start: 2023-09-20 — End: 2023-12-24

## 2023-09-20 MED ORDER — CITALOPRAM HYDROBROMIDE 10 MG PO TABS
ORAL_TABLET | ORAL | 3 refills | Status: DC
Start: 2023-09-20 — End: 2024-08-28

## 2023-09-20 NOTE — Assessment & Plan Note (Signed)
At goal. I will check annual DM labs today. Continue dapagliflozin 10 mg daily, dulaglutide 4.5 mg weekly, and insulin aspart sliding scale (7-9 units TID with meals). Continue to follow with Dr. Lonzo Cloud.

## 2023-09-20 NOTE — Assessment & Plan Note (Signed)
Stable. Rate controlled. Cotninue carvedilol 25 mg bid and apixiban (Eliquis) 5 mg bid. 

## 2023-09-20 NOTE — Assessment & Plan Note (Signed)
Currently having some dysphagia secondary to swallowing certain solids. This could relate to his prior lab-band procedure vs. esophageal scarring from his GERD. I will refer him to GI for an assessment.

## 2023-09-20 NOTE — Assessment & Plan Note (Signed)
At goal. Continue rosuvastatin 10 mg daily.
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
## 2023-09-20 NOTE — Assessment & Plan Note (Signed)
Compensated. Continue carvedilol 25 mg bid, torsemide 20 mg daily, and dapagliflozin 10 mg daily.

## 2023-09-20 NOTE — Assessment & Plan Note (Addendum)
Blood pressure is in good control. Continue carvedilol 25 mg bid. I will check annual renal labs today.

## 2023-09-20 NOTE — Assessment & Plan Note (Signed)
Continue pantoprazole 40 mg daily.  ?

## 2023-09-20 NOTE — Assessment & Plan Note (Signed)
Stable. I will write an order for a lift chair to assist at home.

## 2023-09-20 NOTE — Assessment & Plan Note (Signed)
Stable. As above, I will write an order for a lift chair.

## 2023-09-20 NOTE — Assessment & Plan Note (Signed)
Stable. Continue citalopram 10mg daily

## 2023-09-20 NOTE — Assessment & Plan Note (Signed)
Stable. No angina. Continue carvedilol 25 mg bid and isosorbide mononitrate 60 mg daily.

## 2023-09-26 ENCOUNTER — Telehealth: Payer: Self-pay | Admitting: Family Medicine

## 2023-09-27 NOTE — Telephone Encounter (Signed)
error 

## 2023-10-16 ENCOUNTER — Ambulatory Visit: Payer: Medicare Other | Admitting: Emergency Medicine

## 2023-10-16 ENCOUNTER — Encounter: Payer: Self-pay | Admitting: Emergency Medicine

## 2023-10-16 VITALS — BP 146/82 | HR 90 | Temp 97.6°F | Ht 63.0 in | Wt 207.0 lb

## 2023-10-16 DIAGNOSIS — J4489 Other specified chronic obstructive pulmonary disease: Secondary | ICD-10-CM

## 2023-10-16 DIAGNOSIS — J301 Allergic rhinitis due to pollen: Secondary | ICD-10-CM | POA: Diagnosis not present

## 2023-10-16 DIAGNOSIS — R1319 Other dysphagia: Secondary | ICD-10-CM | POA: Diagnosis not present

## 2023-10-16 MED ORDER — ALBUTEROL SULFATE HFA 108 (90 BASE) MCG/ACT IN AERS: 2.0000 | INHALATION_SPRAY | Freq: Four times a day (QID) | RESPIRATORY_TRACT | 3 refills | Status: DC | PRN

## 2023-10-16 MED ORDER — FLUTICASONE PROPIONATE 50 MCG/ACT NA SUSP: 2.0000 | Freq: Every day | NASAL | 2 refills | Status: DC

## 2023-10-16 MED ORDER — AZELASTINE HCL 137 MCG/SPRAY NA SOLN: 2.0000 | Freq: Two times a day (BID) | NASAL | 5 refills | Status: DC | PRN

## 2023-10-16 NOTE — Patient Instructions (Addendum)
Please continue your Spiriva 2 puffs once daily Continue Wixela twice a day.  Rinse and gargle after using. Continue your albuterol 2 puffs if needed for shortness of breath, chest tightness, wheezing.  We will refill this for you today. Get back on your fluticasone nasal spray (Flonase) 2 sprays each nostril once daily during the allergy season.  After the fall you can go back to using as needed Get back on your Zyrtec once daily during allergy season Continue your Benadryl in the evening as you have been taking it Okay to use your Astelin nasal spray, 2 sprays each nostril up to 2-3 times daily if needed for congestion and drainage Continue to practice care with swallowing to avoid reflux symptoms and to minimize risk for aspiration and aspiration pneumonia Flu shot and COVID-19 vaccine are both up-to-date Follow with Dr. Delton Coombes in 12 months or sooner if you have any problems.

## 2023-10-16 NOTE — Assessment & Plan Note (Signed)
He has follow-up with GI planned.  Needs to continue to work on avoiding aspiration, treating GERD to minimize risk for aspiration pneumonia.

## 2023-10-16 NOTE — Assessment & Plan Note (Signed)
Please continue your Spiriva 2 puffs once daily Continue Wixela twice a day.  Rinse and gargle after using. Continue your albuterol 2 puffs if needed for shortness of breath, chest tightness, wheezing.  We will refill this for you today. Get back on your fluticasone nasal spray (Flonase) 2 sprays each nostril once daily during the allergy season.  After the fall you can go back to using as needed Get back on your Zyrtec once daily during allergy season Continue your Benadryl in the evening as you have been taking it Okay to use your Astelin nasal spray, 2 sprays each nostril up to 2-3 times daily if needed for congestion and drainage Continue to practice care with swallowing to avoid reflux symptoms and to minimize risk for aspiration and aspiration pneumonia Flu shot and COVID-19 vaccine are both up-to-date Follow with Dr. Delton Coombes in 12 months or sooner if you have any problems.

## 2023-10-16 NOTE — Assessment & Plan Note (Signed)
Get back on your fluticasone nasal spray (Flonase) 2 sprays each nostril once daily during the allergy season.  After the fall you can go back to using as needed Get back on your Zyrtec once daily during allergy season Continue your Benadryl in the evening as you have been taking it Okay to use your Astelin nasal spray, 2 sprays each nostril up to 2-3 times daily if needed for congestion and drainage

## 2023-10-16 NOTE — Progress Notes (Signed)
Subjective:    Patient ID: [name] Koyanagi., male    DOB: 02-Sep-1941, 82 y.o.   MRN: [id number]  HPI  ROV 04/17/21 --82 year old gentleman with a history of COPD, chronic rhinitis, hypertension with diastolic CHF, chronic hypoxemic respiratory failure on nocturnal oxygen.  He has a history of frequent recurrent right lower lobe pneumonias in the setting of recurrent aspiration following Lap-Band surgery, hiatal hernia.  Manipulation of his Lap-Band 2 yrs ago has helped with his aspiration symptoms.  Has been managed on Symbicort and Spiriva.  He has albuterol which he uses very rarely.  He is on oxygen 2 L/min while sleeping Fewer episodes of regurgitation, reflux into his mouth. No PNA's or resp infections in the last year. He does not exert very much, but he has not felt SOB. He was going to the [facility] before the most recent increase of COVID.   He is currently managed on Spiriva, Wixella through the [place]. He is on generic zyrtec. Protonix.   ROV 10/11/22 --[name] is 35 and follows up today for his history of COPD.  He has nocturnal hypoxemia and is on supplemental oxygen.  Also with a history of chronic rhinitis, hypertension with diastolic CHF, hiatal hernia and remote recurrent right lower lobe pneumonias due to aspiration that was associated with his Lap-Band surgery. He COVID in 10/2021, was hospitalized. Complicated by A Fib and required subsequent DCCV and is on Eliquis.  He is currently managed on spiriva and wixella. Uses ventolin about once a week or less.  He reports that he has regained some of his functional capacity, but he continues to feel weak. No strength. Associated with SOB. He can ambulate through the house. No cough or wheeze.   CXR 11/11/21 > R basilar infiltrate and effusion, interstitial edema B.   ROV 10/16/23 --follow-up visit for 82 year old gentleman with a history of COPD, nocturnal hypoxemia on O2, hypertension and A-fib with diastolic dysfunction.  He had remote  recurrent right lower lobe pneumonias due to aspiration associated with his Lap-Band surgery, last was 4-5 years ago. He does still have reflux esp w certain foods. Dysphagia of some foods w subsequent emesis. He is having increased congestion this fall. On benadryl at night, zyrtec prn. Astelin prn. Also has flonase that he uses prn.  Currently managed on Wixela and Spiriva. Uses albuterol rarely. No flares.   Review of Systems As per HPI     Objective:   Physical Exam Vitals:   10/16/23 0857  BP: (!) 146/82  Pulse: 90  Temp: 97.6 F (36.4 C)  TempSrc: Oral  SpO2: 96%  Weight: 207 lb (93.9 kg)  Height: 5\' 3"  (1.6 m)   Gen: Pleasant, overwt, in no distress,  normal affect  ENT: No lesions,  mouth clear,  oropharynx clear, no postnasal drip  Neck: No JVD, no stridor  Lungs: No use of accessory muscles, distant, clear bilaterally without wheeze, decreased at both bases.   CV: regular, no M gallops, trace ankle edema  Musculoskeletal: No deformities, no cyanosis or clubbing  Neuro: alert, non focal  Skin: Warm, no lesions or rash       Assessment & Plan:  COPD with chronic bronchitis (HCC) Please continue your Spiriva 2 puffs once daily Continue Wixela twice a day.  Rinse and gargle after using. Continue your albuterol 2 puffs if needed for shortness of breath, chest tightness, wheezing.  We will refill this for you today. Get back on your fluticasone nasal spray (Flonase) 2 sprays  each nostril once daily during the allergy season.  After the fall you can go back to using as needed Get back on your Zyrtec once daily during allergy season Continue your Benadryl in the evening as you have been taking it Okay to use your Astelin nasal spray, 2 sprays each nostril up to 2-3 times daily if needed for congestion and drainage Continue to practice care with swallowing to avoid reflux symptoms and to minimize risk for aspiration and aspiration pneumonia Flu shot and COVID-19  vaccine are both up-to-date Follow with Dr. Delton Coombes in 12 months or sooner if you have any problems.   Allergic rhinitis Get back on your fluticasone nasal spray (Flonase) 2 sprays each nostril once daily during the allergy season.  After the fall you can go back to using as needed Get back on your Zyrtec once daily during allergy season Continue your Benadryl in the evening as you have been taking it Okay to use your Astelin nasal spray, 2 sprays each nostril up to 2-3 times daily if needed for congestion and drainage  Esophageal dysphagia He has follow-up with GI planned.  Needs to continue to work on avoiding aspiration, treating GERD to minimize risk for aspiration pneumonia.   Levy Pupa, MD, PhD 10/16/2023, 9:26 AM [name] [phone] or if no answer [phone]

## 2023-11-02 ENCOUNTER — Other Ambulatory Visit: Payer: Self-pay | Admitting: Family Medicine

## 2023-11-02 DIAGNOSIS — I5032 Chronic diastolic (congestive) heart failure: Secondary | ICD-10-CM

## 2023-11-02 DIAGNOSIS — M1A9XX0 Chronic gout, unspecified, without tophus (tophi): Secondary | ICD-10-CM

## 2023-11-22 ENCOUNTER — Other Ambulatory Visit: Payer: Self-pay | Admitting: Internal Medicine

## 2023-11-22 DIAGNOSIS — E1169 Type 2 diabetes mellitus with other specified complication: Secondary | ICD-10-CM

## 2023-11-27 ENCOUNTER — Telehealth: Payer: Self-pay | Admitting: Cardiovascular Disease

## 2023-11-27 NOTE — Telephone Encounter (Signed)
   Pre-operative Risk Assessment    Patient Name: [name].  DOB: [birth year] MRN: [id number]      Request for Surgical Clearance    Procedure:   Sedation Biopsy of the Right Lower Lip   Date of Surgery:  Clearance TBD                                 Surgeon:  Dr. Tommi Emery Group or Practice Name:  [facility] Phone number:  [phone] Fax number:  [phone]   Type of Clearance Requested:   - Pharmacy:  Hold Apixaban (Eliquis) would like to know how long the patient can hold the medication and when the patient should resume the medication   Type of Anesthesia:  Local    Additional requests/questions:      Cathey Endow   11/27/2023, 11:34 AM

## 2023-11-28 NOTE — Telephone Encounter (Signed)
    Primary Cardiologist:None  Chart reviewed as part of pre-operative protocol coverage. Because of [name] Gavette Jr.'s past medical history and time since last visit, he/she will require a follow-up visit in order to better assess preoperative cardiovascular risk.  Pre-op covering staff: - Please schedule OFFICE appointment and call patient to inform them. - Please contact requesting surgeon's office via preferred method (i.e, phone, fax) to inform them of need for appointment prior to surgery.  If applicable, this message will also be routed to pharmacy pool and/or primary cardiologist for input on holding anticoagulant/antiplatelet agent as requested below so that this information is available at time of patient's appointment.   Ronney Asters, NP  11/28/2023, 8:40 AM

## 2023-11-28 NOTE — Telephone Encounter (Signed)
Patient with diagnosis of atrial fibrillation on Eliquis for anticoagulation.    Procedure:   Sedation Biopsy of the Right Lower Lip    Date of Surgery:  Clearance TBD      CHA2DS2-VASc Score = 8   This indicates a 10.8% annual risk of stroke. The patient's score is based upon: CHF History: 1 HTN History: 1 Diabetes History: 1 Stroke History: 2 Vascular Disease History: 1 Age Score: 2 Gender Score: 0   Per chart stroke occurred in 2007  CrCl 45 Platelet count 198  Per office protocol, patient can hold Eliquis for 2 days prior to procedure.   Patient will not need bridging with Lovenox (enoxaparin) around procedure.  **This guidance is not considered finalized until pre-operative APP has relayed final recommendations.**

## 2023-11-29 ENCOUNTER — Encounter: Payer: Self-pay | Admitting: Cardiovascular Disease

## 2023-11-29 NOTE — Telephone Encounter (Signed)
Error

## 2023-11-29 NOTE — Telephone Encounter (Signed)
Spoke with patient who is agreeable to do an in office visit on 12/31 at 8:55 am.

## 2023-12-13 ENCOUNTER — Encounter: Payer: Self-pay | Admitting: Family Medicine

## 2023-12-13 ENCOUNTER — Ambulatory Visit: Payer: Medicare Other | Admitting: Family Medicine

## 2023-12-13 VITALS — BP 116/64 | HR 98 | Temp 97.6°F | Ht 63.0 in | Wt 208.0 lb

## 2023-12-13 DIAGNOSIS — N1832 Chronic kidney disease, stage 3b: Secondary | ICD-10-CM | POA: Diagnosis not present

## 2023-12-13 DIAGNOSIS — I5032 Chronic diastolic (congestive) heart failure: Secondary | ICD-10-CM

## 2023-12-13 DIAGNOSIS — E1122 Type 2 diabetes mellitus with diabetic chronic kidney disease: Secondary | ICD-10-CM

## 2023-12-13 DIAGNOSIS — Z794 Long term (current) use of insulin: Secondary | ICD-10-CM

## 2023-12-13 DIAGNOSIS — E782 Mixed hyperlipidemia: Secondary | ICD-10-CM

## 2023-12-13 DIAGNOSIS — I2510 Atherosclerotic heart disease of native coronary artery without angina pectoris: Secondary | ICD-10-CM | POA: Diagnosis not present

## 2023-12-13 DIAGNOSIS — N3001 Acute cystitis with hematuria: Secondary | ICD-10-CM

## 2023-12-13 DIAGNOSIS — I10 Essential (primary) hypertension: Secondary | ICD-10-CM | POA: Diagnosis not present

## 2023-12-13 DIAGNOSIS — D100 Benign neoplasm of lip: Secondary | ICD-10-CM | POA: Insufficient documentation

## 2023-12-13 LAB — POCT URINALYSIS DIPSTICK
Bilirubin, UA: NEGATIVE
Glucose, UA: POSITIVE — AB
Ketones, UA: NEGATIVE
Nitrite, UA: NEGATIVE
Protein, UA: POSITIVE — AB
Spec Grav, UA: 1.015 (ref 1.010–1.025)
Urobilinogen, UA: NEGATIVE U/dL — AB
pH, UA: 6 (ref 5.0–8.0)

## 2023-12-13 LAB — GLUCOSE, RANDOM: Glucose, Bld: 186 mg/dL — ABNORMAL HIGH (ref 70–99)

## 2023-12-13 LAB — HEMOGLOBIN A1C: Hgb A1c MFr Bld: 6.4 % (ref 4.6–6.5)

## 2023-12-13 MED ORDER — NITROFURANTOIN MONOHYD MACRO 100 MG PO CAPS
100.0000 mg | ORAL_CAPSULE | Freq: Two times a day (BID) | ORAL | 0 refills | Status: AC
Start: 2023-12-13 — End: 2023-12-18

## 2023-12-13 NOTE — Assessment & Plan Note (Signed)
Compensated. Continue carvedilol 25 mg bid, torsemide 20 mg daily, and dapagliflozin 10 mg daily.

## 2023-12-13 NOTE — Assessment & Plan Note (Signed)
At goal. I will check an A1c today. Continue dapagliflozin 10 mg daily, dulaglutide 4.5 mg weekly, and insulin aspart sliding scale (7-9 units TID with meals). Continue to follow with Dr. Lonzo Cloud.

## 2023-12-13 NOTE — Assessment & Plan Note (Signed)
Appears to be a benign growth, possibly a mucous cyst. He will follow-up with ENT at the [facility] regarding excision.

## 2023-12-13 NOTE — Assessment & Plan Note (Signed)
Blood pressure is in good control. Continue carvedilol 25 mg bid.

## 2023-12-13 NOTE — Assessment & Plan Note (Signed)
Stable. No angina. Continue carvedilol 25 mg bid and isosorbide mononitrate 60 mg daily.
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
## 2023-12-13 NOTE — Assessment & Plan Note (Signed)
At goal. Continue rosuvastatin 10 mg daily.

## 2023-12-13 NOTE — Assessment & Plan Note (Signed)
Stable. Continue focus on blood pressure and glucose control, adequate hydration, and avoidance of nephrotoxic medications.

## 2023-12-16 LAB — URINE CULTURE
MICRO NUMBER:: 15877080
SPECIMEN QUALITY:: ADEQUATE
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
## 2023-12-20 ENCOUNTER — Ambulatory Visit: Payer: Medicare Other | Admitting: Family Medicine

## 2023-12-22 ENCOUNTER — Other Ambulatory Visit: Payer: Self-pay | Admitting: Family Medicine

## 2023-12-22 DIAGNOSIS — I2510 Atherosclerotic heart disease of native coronary artery without angina pectoris: Secondary | ICD-10-CM

## 2023-12-23 ENCOUNTER — Ambulatory Visit: Payer: Medicare Other | Admitting: Family Medicine

## 2023-12-24 ENCOUNTER — Ambulatory Visit: Payer: Medicare Other | Attending: Physician Assistant | Admitting: Physician Assistant

## 2023-12-24 ENCOUNTER — Encounter: Payer: Self-pay | Admitting: Physician Assistant

## 2023-12-24 VITALS — BP 110/50 | HR 95 | Ht 63.0 in | Wt 208.0 lb

## 2023-12-24 DIAGNOSIS — E782 Mixed hyperlipidemia: Secondary | ICD-10-CM

## 2023-12-24 DIAGNOSIS — I10 Essential (primary) hypertension: Secondary | ICD-10-CM | POA: Diagnosis not present

## 2023-12-24 DIAGNOSIS — I5032 Chronic diastolic (congestive) heart failure: Secondary | ICD-10-CM

## 2023-12-24 DIAGNOSIS — Z01810 Encounter for preprocedural cardiovascular examination: Secondary | ICD-10-CM

## 2023-12-24 DIAGNOSIS — J4489 Other specified chronic obstructive pulmonary disease: Secondary | ICD-10-CM

## 2023-12-24 DIAGNOSIS — I2510 Atherosclerotic heart disease of native coronary artery without angina pectoris: Secondary | ICD-10-CM | POA: Diagnosis not present

## 2023-12-24 DIAGNOSIS — I480 Paroxysmal atrial fibrillation: Secondary | ICD-10-CM

## 2023-12-24 DIAGNOSIS — E785 Hyperlipidemia, unspecified: Secondary | ICD-10-CM

## 2023-12-24 MED ORDER — ROSUVASTATIN CALCIUM 20 MG PO TABS: 20.0000 mg | ORAL_TABLET | Freq: Every day | ORAL | 3 refills | Status: DC

## 2023-12-24 MED ORDER — APIXABAN 2.5 MG PO TABS: 2.5000 mg | ORAL_TABLET | Freq: Two times a day (BID) | ORAL | 0 refills | Status: DC

## 2023-12-24 NOTE — Patient Instructions (Signed)
 Medication Instructions:  INCREASE CRESTOR  20MG  DAILY ELIQUIS  2.5MG  TWICE DAILY *If you need a refill on your cardiac medications before your next appointment, please call your pharmacy*  Lab Work: NONE  Testing/Procedures: OK FOR UPCOMING SURGERY  Follow-Up: At [facility], you and your health needs are our priority.  As part of our continuing mission to provide you with exceptional heart care, we have created designated Provider Care Teams.  These Care Teams include your primary Cardiologist (physician) and Advanced Practice Providers (APPs -  Physician Assistants and Nurse Practitioners) who all work together to provide you with the care you need, when you need it.  Your next appointment:   12 month(s)  Provider:   Lonni Cash, MD    Other Instructions

## 2024-01-01 ENCOUNTER — Encounter: Payer: Self-pay | Admitting: Gastroenterology

## 2024-01-01 ENCOUNTER — Ambulatory Visit: Payer: Medicare Other | Admitting: Gastroenterology

## 2024-01-01 VITALS — BP 124/72 | HR 97 | Ht 63.0 in | Wt 207.0 lb

## 2024-01-01 DIAGNOSIS — R1319 Other dysphagia: Secondary | ICD-10-CM

## 2024-01-01 DIAGNOSIS — Z9884 Bariatric surgery status: Secondary | ICD-10-CM | POA: Diagnosis not present

## 2024-01-01 DIAGNOSIS — R1310 Dysphagia, unspecified: Secondary | ICD-10-CM | POA: Diagnosis not present

## 2024-01-01 DIAGNOSIS — K5909 Other constipation: Secondary | ICD-10-CM | POA: Diagnosis not present

## 2024-01-01 NOTE — Patient Instructions (Signed)
 You have been scheduled for a Barium Esophogram at [facility]iology (1st floor of the hospital) on 01/08/2024 at 10:00 am. Please arrive 30 minutes prior to your appointment for registration. Make certain not to have anything to eat or drink 3 hours prior to your test. If you need to reschedule for any reason, please contact radiology at [phone] to do so. __________________________________________________________________ A barium swallow is an examination that concentrates on views of the esophagus. This tends to be a double contrast exam (barium and two liquids which, when combined, create a gas to distend the wall of the oesophagus) or single contrast (non-ionic iodine based). The study is usually tailored to your symptoms so a good history is essential. Attention is paid during the study to the form, structure and configuration of the esophagus, looking for functional disorders (such as aspiration, dysphagia, achalasia, motility and reflux) EXAMINATION You may be asked to change into a gown, depending on the type of swallow being performed. A radiologist and radiographer will perform the procedure. The radiologist will advise you of the type of contrast selected for your procedure and direct you during the exam. You will be asked to stand, sit or lie in several different positions and to hold a small amount of fluid in your mouth before being asked to swallow while the imaging is performed .In some instances you may be asked to swallow barium coated marshmallows to assess the motility of a solid food bolus. The exam can be recorded as a digital or video fluoroscopy procedure. POST PROCEDURE It will take 1-2 days for the barium to pass through your system. To facilitate this, it is important, unless otherwise directed, to increase your fluids for the next 24-48hrs and to resume your normal diet.  This test typically takes about 30 minutes to  perform. __________________________________________________________________________________   _______________________________________________________  If your blood pressure at your visit was 140/90 or greater, please contact your primary care physician to follow up on this.  _______________________________________________________  If you are age 73 or older, your body mass index should be between 23-30. Your Body mass index is 36.67 kg/m. If this is out of the aforementioned range listed, please consider follow up with your Primary Care Provider.  If you are age 32 or younger, your body mass index should be between 19-25. Your Body mass index is 36.67 kg/m. If this is out of the aformentioned range listed, please consider follow up with your Primary Care Provider.   ________________________________________________________  The [facility] providers would like to encourage you to use MYCHART to communicate with providers for non-urgent requests or questions.  Due to long hold times on the telephone, sending your provider a message by [facility] may be a faster and more efficient way to get a response.  Please allow 48 business hours for a response.  Please remember that this is for non-urgent requests.  _______________________________________________________ It was a pleasure to see you today!  Thank you for trusting me with your gastrointestinal care!
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
## 2024-01-08 ENCOUNTER — Ambulatory Visit (HOSPITAL_COMMUNITY)
Admission: RE | Admit: 2024-01-08 | Discharge: 2024-01-08 | Disposition: A | Discharge status: Home / Self Care | Payer: Medicare Other | Source: Ambulatory Visit | Attending: Gastroenterology | Admitting: Gastroenterology

## 2024-01-08 DIAGNOSIS — Z9884 Bariatric surgery status: Secondary | ICD-10-CM | POA: Insufficient documentation

## 2024-01-08 DIAGNOSIS — R1319 Other dysphagia: Secondary | ICD-10-CM | POA: Insufficient documentation

## 2024-01-08 DIAGNOSIS — J101 Influenza due to other identified influenza virus with other respiratory manifestations: Secondary | ICD-10-CM | POA: Diagnosis not present

## 2024-01-10 ENCOUNTER — Other Ambulatory Visit: Payer: Self-pay

## 2024-01-10 ENCOUNTER — Emergency Department (HOSPITAL_COMMUNITY): Payer: Medicare Other

## 2024-01-10 ENCOUNTER — Encounter (HOSPITAL_COMMUNITY): Payer: Self-pay | Admitting: Family Medicine

## 2024-01-10 ENCOUNTER — Inpatient Hospital Stay (HOSPITAL_COMMUNITY)
Admission: EM | Admit: 2024-01-10 | Discharge: 2024-01-15 | DRG: 194 | Disposition: A | Discharge status: Home Health | Payer: Medicare Other | Attending: Internal Medicine | Admitting: Internal Medicine

## 2024-01-10 DIAGNOSIS — I2510 Atherosclerotic heart disease of native coronary artery without angina pectoris: Secondary | ICD-10-CM | POA: Diagnosis present

## 2024-01-10 DIAGNOSIS — J101 Influenza due to other identified influenza virus with other respiratory manifestations: Secondary | ICD-10-CM | POA: Diagnosis not present

## 2024-01-10 DIAGNOSIS — M810 Age-related osteoporosis without current pathological fracture: Secondary | ICD-10-CM | POA: Diagnosis present

## 2024-01-10 DIAGNOSIS — I5032 Chronic diastolic (congestive) heart failure: Secondary | ICD-10-CM | POA: Diagnosis present

## 2024-01-10 DIAGNOSIS — N2581 Secondary hyperparathyroidism of renal origin: Secondary | ICD-10-CM | POA: Diagnosis present

## 2024-01-10 DIAGNOSIS — Z85828 Personal history of other malignant neoplasm of skin: Secondary | ICD-10-CM

## 2024-01-10 DIAGNOSIS — I130 Hypertensive heart and chronic kidney disease with heart failure and stage 1 through stage 4 chronic kidney disease, or unspecified chronic kidney disease: Secondary | ICD-10-CM | POA: Diagnosis present

## 2024-01-10 DIAGNOSIS — Z8673 Personal history of transient ischemic attack (TIA), and cerebral infarction without residual deficits: Secondary | ICD-10-CM

## 2024-01-10 DIAGNOSIS — Z888 Allergy status to other drugs, medicaments and biological substances status: Secondary | ICD-10-CM

## 2024-01-10 DIAGNOSIS — J309 Allergic rhinitis, unspecified: Secondary | ICD-10-CM | POA: Diagnosis present

## 2024-01-10 DIAGNOSIS — J9611 Chronic respiratory failure with hypoxia: Secondary | ICD-10-CM | POA: Diagnosis not present

## 2024-01-10 DIAGNOSIS — Z961 Presence of intraocular lens: Secondary | ICD-10-CM | POA: Diagnosis present

## 2024-01-10 DIAGNOSIS — N1832 Chronic kidney disease, stage 3b: Secondary | ICD-10-CM | POA: Diagnosis not present

## 2024-01-10 DIAGNOSIS — E785 Hyperlipidemia, unspecified: Secondary | ICD-10-CM | POA: Diagnosis present

## 2024-01-10 DIAGNOSIS — E1151 Type 2 diabetes mellitus with diabetic peripheral angiopathy without gangrene: Secondary | ICD-10-CM | POA: Diagnosis present

## 2024-01-10 DIAGNOSIS — Z91013 Allergy to seafood: Secondary | ICD-10-CM

## 2024-01-10 DIAGNOSIS — I10 Essential (primary) hypertension: Secondary | ICD-10-CM | POA: Diagnosis present

## 2024-01-10 DIAGNOSIS — Z87891 Personal history of nicotine dependence: Secondary | ICD-10-CM

## 2024-01-10 DIAGNOSIS — Z6836 Body mass index (BMI) 36.0-36.9, adult: Secondary | ICD-10-CM

## 2024-01-10 DIAGNOSIS — Z9842 Cataract extraction status, left eye: Secondary | ICD-10-CM

## 2024-01-10 DIAGNOSIS — Z7901 Long term (current) use of anticoagulants: Secondary | ICD-10-CM

## 2024-01-10 DIAGNOSIS — J440 Chronic obstructive pulmonary disease with acute lower respiratory infection: Secondary | ICD-10-CM | POA: Diagnosis present

## 2024-01-10 DIAGNOSIS — Z9841 Cataract extraction status, right eye: Secondary | ICD-10-CM

## 2024-01-10 DIAGNOSIS — Y831 Surgical operation with implant of artificial internal device as the cause of abnormal reaction of the patient, or of later complication, without mention of misadventure at the time of the procedure: Secondary | ICD-10-CM | POA: Diagnosis present

## 2024-01-10 DIAGNOSIS — Z9884 Bariatric surgery status: Secondary | ICD-10-CM

## 2024-01-10 DIAGNOSIS — I252 Old myocardial infarction: Secondary | ICD-10-CM

## 2024-01-10 DIAGNOSIS — J4489 Other specified chronic obstructive pulmonary disease: Secondary | ICD-10-CM | POA: Diagnosis present

## 2024-01-10 DIAGNOSIS — I5042 Chronic combined systolic (congestive) and diastolic (congestive) heart failure: Secondary | ICD-10-CM | POA: Diagnosis present

## 2024-01-10 DIAGNOSIS — K9509 Other complications of gastric band procedure: Secondary | ICD-10-CM | POA: Diagnosis present

## 2024-01-10 DIAGNOSIS — J9612 Chronic respiratory failure with hypercapnia: Secondary | ICD-10-CM | POA: Diagnosis present

## 2024-01-10 DIAGNOSIS — Z86711 Personal history of pulmonary embolism: Secondary | ICD-10-CM

## 2024-01-10 DIAGNOSIS — Z794 Long term (current) use of insulin: Secondary | ICD-10-CM

## 2024-01-10 DIAGNOSIS — Z8249 Family history of ischemic heart disease and other diseases of the circulatory system: Secondary | ICD-10-CM

## 2024-01-10 DIAGNOSIS — R630 Anorexia: Secondary | ICD-10-CM | POA: Diagnosis present

## 2024-01-10 DIAGNOSIS — I480 Paroxysmal atrial fibrillation: Secondary | ICD-10-CM | POA: Diagnosis present

## 2024-01-10 DIAGNOSIS — E1122 Type 2 diabetes mellitus with diabetic chronic kidney disease: Secondary | ICD-10-CM | POA: Diagnosis present

## 2024-01-10 DIAGNOSIS — R1310 Dysphagia, unspecified: Secondary | ICD-10-CM | POA: Diagnosis present

## 2024-01-10 DIAGNOSIS — Z9981 Dependence on supplemental oxygen: Secondary | ICD-10-CM

## 2024-01-10 DIAGNOSIS — Z1152 Encounter for screening for COVID-19: Secondary | ICD-10-CM

## 2024-01-10 DIAGNOSIS — Z955 Presence of coronary angioplasty implant and graft: Secondary | ICD-10-CM

## 2024-01-10 DIAGNOSIS — E1165 Type 2 diabetes mellitus with hyperglycemia: Secondary | ICD-10-CM | POA: Diagnosis present

## 2024-01-10 DIAGNOSIS — Z833 Family history of diabetes mellitus: Secondary | ICD-10-CM

## 2024-01-10 DIAGNOSIS — Z79899 Other long term (current) drug therapy: Secondary | ICD-10-CM

## 2024-01-10 LAB — COMPREHENSIVE METABOLIC PANEL
ALT: 17 U/L (ref 0–44)
AST: 23 U/L (ref 15–41)
Albumin: 3.4 g/dL — ABNORMAL LOW (ref 3.5–5.0)
Alkaline Phosphatase: 59 U/L (ref 38–126)
Anion gap: 16 — ABNORMAL HIGH (ref 5–15)
BUN: 50 mg/dL — ABNORMAL HIGH (ref 8–23)
CO2: 26 mmol/L (ref 22–32)
Calcium: 9.2 mg/dL (ref 8.9–10.3)
Chloride: 95 mmol/L — ABNORMAL LOW (ref 98–111)
Creatinine, Ser: 1.87 mg/dL — ABNORMAL HIGH (ref 0.61–1.24)
GFR, Estimated: 36 mL/min — ABNORMAL LOW (ref >60)
Glucose, Bld: 179 mg/dL — ABNORMAL HIGH (ref 70–99)
Potassium: 3.7 mmol/L (ref 3.5–5.1)
Sodium: 137 mmol/L (ref 135–145)
Total Bilirubin: 0.9 mg/dL (ref 0.0–1.2)
Total Protein: 6.7 g/dL (ref 6.5–8.1)

## 2024-01-10 LAB — RESP PANEL BY RT-PCR (RSV, FLU A&B, COVID)  RVPGX2
Influenza A by PCR: POSITIVE — AB
Influenza B by PCR: NEGATIVE
Resp Syncytial Virus by PCR: NEGATIVE
SARS Coronavirus 2 by RT PCR: NEGATIVE

## 2024-01-10 LAB — CBC WITH DIFFERENTIAL/PLATELET
Abs Immature Granulocytes: 0.02 10*3/uL (ref 0.00–0.07)
Basophils Absolute: 0 10*3/uL (ref 0.0–0.1)
Basophils Relative: 0 %
Eosinophils Absolute: 0 10*3/uL (ref 0.0–0.5)
Eosinophils Relative: 0 %
HCT: 43.3 % (ref 39.0–52.0)
Hemoglobin: 14.1 g/dL (ref 13.0–17.0)
Immature Granulocytes: 0 %
Lymphocytes Relative: 9 %
Lymphs Abs: 0.8 10*3/uL (ref 0.7–4.0)
MCH: 30.1 pg (ref 26.0–34.0)
MCHC: 32.6 g/dL (ref 30.0–36.0)
MCV: 92.5 fL (ref 80.0–100.0)
Monocytes Absolute: 1 10*3/uL (ref 0.1–1.0)
Monocytes Relative: 11 %
Neutro Abs: 7 10*3/uL (ref 1.7–7.7)
Neutrophils Relative %: 80 %
Platelets: 203 10*3/uL (ref 150–400)
RBC: 4.68 MIL/uL (ref 4.22–5.81)
RDW: 13.4 % (ref 11.5–15.5)
WBC: 8.9 10*3/uL (ref 4.0–10.5)
nRBC: 0 % (ref 0.0–0.2)

## 2024-01-10 LAB — TROPONIN I (HIGH SENSITIVITY)
Troponin I (High Sensitivity): 11 ng/L (ref <18)
Troponin I (High Sensitivity): 11 ng/L (ref <18)

## 2024-01-10 LAB — BRAIN NATRIURETIC PEPTIDE: B Natriuretic Peptide: 157.2 pg/mL — ABNORMAL HIGH (ref 0.0–100.0)

## 2024-01-10 MED ORDER — ACETAMINOPHEN 650 MG RE SUPP: 650.0000 mg | Freq: Four times a day (QID) | RECTAL | Status: DC | PRN

## 2024-01-10 MED ORDER — IPRATROPIUM-ALBUTEROL 0.5-2.5 (3) MG/3ML IN SOLN
3.0000 mL | Freq: Once | RESPIRATORY_TRACT | Status: AC
  Administered 2024-01-10: 3 mL via RESPIRATORY_TRACT
  Filled 2024-01-10: qty 3

## 2024-01-10 MED ORDER — GUAIFENESIN 100 MG/5ML PO LIQD: 5.0000 mL | ORAL | Status: DC | PRN

## 2024-01-10 MED ORDER — OSELTAMIVIR PHOSPHATE 30 MG PO CAPS: 30.0000 mg | ORAL_CAPSULE | Freq: Two times a day (BID) | ORAL | Status: DC

## 2024-01-10 MED ORDER — SODIUM CHLORIDE 0.9 % IV SOLN
500.0000 mg | Freq: Once | INTRAVENOUS | Status: DC
  Filled 2024-01-10: qty 5

## 2024-01-10 MED ORDER — OSELTAMIVIR PHOSPHATE 30 MG PO CAPS
30.0000 mg | ORAL_CAPSULE | Freq: Two times a day (BID) | ORAL | Status: DC
  Administered 2024-01-11 – 2024-01-15 (×9): 30 mg via ORAL
  Filled 2024-01-10 (×12): qty 1

## 2024-01-10 MED ORDER — INSULIN ASPART 100 UNIT/ML IJ SOLN
0.0000 [IU] | Freq: Three times a day (TID) | INTRAMUSCULAR | Status: DC
  Administered 2024-01-11: 4 [IU] via SUBCUTANEOUS
  Administered 2024-01-11: 3 [IU] via SUBCUTANEOUS

## 2024-01-10 MED ORDER — ONDANSETRON HCL 4 MG/2ML IJ SOLN: 4.0000 mg | Freq: Four times a day (QID) | INTRAMUSCULAR | Status: DC | PRN

## 2024-01-10 MED ORDER — OSELTAMIVIR PHOSPHATE 75 MG PO CAPS: 75.0000 mg | ORAL_CAPSULE | Freq: Two times a day (BID) | ORAL | Status: DC

## 2024-01-10 MED ORDER — METHYLPREDNISOLONE SODIUM SUCC 125 MG IJ SOLR
125.0000 mg | Freq: Once | INTRAMUSCULAR | Status: AC
  Administered 2024-01-10: 125 mg via INTRAVENOUS
  Filled 2024-01-10: qty 2

## 2024-01-10 MED ORDER — IPRATROPIUM-ALBUTEROL 0.5-2.5 (3) MG/3ML IN SOLN: 3.0000 mL | RESPIRATORY_TRACT | Status: DC | PRN

## 2024-01-10 MED ORDER — INSULIN ASPART 100 UNIT/ML IJ SOLN
3.0000 [IU] | Freq: Three times a day (TID) | INTRAMUSCULAR | Status: DC
  Administered 2024-01-11 (×3): 3 [IU] via SUBCUTANEOUS

## 2024-01-10 MED ORDER — ACETAMINOPHEN 325 MG PO TABS: 650.0000 mg | ORAL_TABLET | Freq: Four times a day (QID) | ORAL | Status: DC | PRN

## 2024-01-10 MED ORDER — SODIUM CHLORIDE 0.9 % IV SOLN
1.0000 g | Freq: Once | INTRAVENOUS | Status: DC
  Filled 2024-01-10: qty 10

## 2024-01-10 MED ORDER — INSULIN ASPART 100 UNIT/ML IJ SOLN
0.0000 [IU] | Freq: Every day | INTRAMUSCULAR | Status: DC
  Administered 2024-01-11: 3 [IU] via SUBCUTANEOUS

## 2024-01-10 MED ORDER — SENNOSIDES-DOCUSATE SODIUM 8.6-50 MG PO TABS: 1.0000 | ORAL_TABLET | Freq: Every evening | ORAL | Status: DC | PRN

## 2024-01-10 MED ORDER — ONDANSETRON HCL 4 MG PO TABS: 4.0000 mg | ORAL_TABLET | Freq: Four times a day (QID) | ORAL | Status: DC | PRN

## 2024-01-10 MED ORDER — OSELTAMIVIR PHOSPHATE 75 MG PO CAPS
75.0000 mg | ORAL_CAPSULE | Freq: Once | ORAL | Status: AC
  Administered 2024-01-11: 75 mg via ORAL
  Filled 2024-01-10: qty 1

## 2024-01-10 NOTE — ED Notes (Signed)
Patient transported to X-ray 

## 2024-01-10 NOTE — ED Provider Notes (Signed)
[place] EMERGENCY DEPARTMENT AT [facility] Provider Note   CSN: [id number] Arrival date & time: 01/10/24  2051     History  Chief Complaint  Patient presents with   Shortness of Breath    Stephon Dernbach. is a 83 y.o. male past medical history significant for COPD, CHF, pneumonia presents today for shortness of breath that started this morning with a productive cough x 2 days.  Patient endorses chest tightness and wheezing.  Patient denies fever, chills, nausea, vomiting, diarrhea, chest pain, abdominal pain, leg swelling, or any other complaints at this time.  Patient is on 2 L nasal cannula at baseline.   Shortness of Breath Associated symptoms: cough        Home Medications Prior to Admission medications   Medication Sig Start Date End Date Taking? Authorizing Provider  albuterol (VENTOLIN HFA) 108 (90 Base) MCG/ACT inhaler Inhale 2 puffs into the lungs every 6 (six) hours as needed for wheezing or shortness of breath. 10/16/23   Leslye Peer, MD  allopurinol (ZYLOPRIM) 300 MG tablet TAKE 1 TABLET BY MOUTH DAILY 11/04/23   Loyola Mast, MD  apixaban (ELIQUIS) 2.5 MG TABS tablet Take 1 tablet (2.5 mg total) by mouth 2 (two) times daily. 12/24/23 03/23/24  Marcelino Duster, PA  Azelastine HCl 137 MCG/SPRAY SOLN Place 2 puffs into the nose every 12 (twelve) hours as needed (Nasal congestion). 10/16/23   Leslye Peer, MD  B Complex Vitamins (VITAMIN B COMPLEX PO) Take 1 tablet by mouth daily.    [provider]  calcitRIOL (ROCALTROL) 0.25 MCG capsule TAKE 1 CAPSULE BY MOUTH DAILY 02/27/23   Loyola Mast, MD  carvedilol (COREG) 25 MG tablet TAKE 1 TABLET BY MOUTH TWICE  DAILY WITH A MEAL 02/27/23   Loyola Mast, MD  cetirizine (ZYRTEC) 10 MG tablet Take 10 mg by mouth as needed for allergies.     [provider]  cholecalciferol (VITAMIN D3) 25 MCG (1000 UNIT) tablet Take 1,000 Units by mouth daily.    [provider]   citalopram (CELEXA) 10 MG tablet TAKE 1 TABLET BY MOUTH DAILY 09/20/23   Loyola Mast, MD  diphenhydrAMINE (BENADRYL) 25 MG tablet Take 25 mg by mouth at bedtime.    [provider]  Dulaglutide (TRULICITY) 4.5 MG/0.5ML SOPN Inject 4.5 mg as directed once a week. 07/25/23   Shamleffer, Konrad Dolores, MD  FARXIGA 10 MG TABS tablet TAKE 1 TABLET BY MOUTH DAILY. 11/25/23   Shamleffer, Konrad Dolores, MD  ferrous sulfate 324 MG TBEC Take 324 mg by mouth daily at 2 PM.    [provider]  finasteride (PROSCAR) 5 MG tablet TAKE 1 TABLET(5 MG) BY MOUTH DAILY 05/27/23   Loyola Mast, MD  fluticasone [facility]) 50 MCG/ACT nasal spray Place 2 sprays into both nostrils daily. 10/16/23   Leslye Peer, MD  fluticasone-salmeterol (WIXELA INHUB) 250-50 MCG/ACT AEPB Inhale 1 puff into the lungs in the morning and at bedtime.    [provider]  hydrocortisone 2.5 % cream Apply 1 application topically. 11/29/21   [provider]  insulin aspart (NOVOLOG) 100 UNIT/ML injection Inject 7-9 Units into the skin 3 (three) times daily before meals. 08/24/21   Romero Belling, MD  isosorbide mononitrate (IMDUR) 60 MG 24 hr tablet TAKE 1 TABLET BY MOUTH DAILY 12/23/23   Loyola Mast, MD  ketoconazole (NIZORAL) 2 % cream Apply 1 application topically daily as needed for irritation.  08/14/13   [provider]  Multiple Vitamins-Minerals (MULTIVITAMIN PO) Take 1 tablet by mouth daily.    [provider]  nitroGLYCERIN (NITROLINGUAL) 0.4 MG/SPRAY spray Place 1 spray under the tongue as directed. 06/20/23   Loyola Mast, MD  nystatin cream (MYCOSTATIN) Apply 1 application topically 2 (two) times daily. 11/29/21   [provider]  OXYGEN Inhale 2 L into the lungs at bedtime.    [provider]  pantoprazole (PROTONIX) 40 MG tablet Take 1 tablet (40 mg total) by mouth daily. 09/20/23   Loyola Mast, MD  polyethylene glycol powder (GLYCOLAX/MIRALAX) 17  GM/SCOOP powder Take 17 g by mouth daily as needed (constipation).    [provider]  Protein Corliss Marcus UNFLAVORED PO) Take 8-16 oz by mouth daily.    [provider]  rosuvastatin (CRESTOR) 20 MG tablet Take 1 tablet (20 mg total) by mouth daily. 12/24/23   [facility], Roe Rutherford, PA  Tiotropium Bromide Monohydrate (SPIRIVA RESPIMAT) 2.5 MCG/ACT AERS Inhale 1 puff into the lungs daily.    [provider]  torsemide (DEMADEX) 20 MG tablet TAKE 3 TABLETS BY MOUTH DAILY 11/04/23   Loyola Mast, MD  traZODone (DESYREL) 100 MG tablet TAKE 1 TABLET BY MOUTH AT  BEDTIME 02/27/23   Loyola Mast, MD      Allergies    Enalapril maleate, Lisinopril, Shellfish-derived products, and Iodine-kelp [iodine]    Review of Systems   Review of Systems  Respiratory:  Positive for cough, chest tightness and shortness of breath.     Physical Exam Updated Vital Signs BP (!) 179/84   Pulse (!) 108   Temp 99 F (37.2 C) (Oral)   Resp 20   SpO2 96%  Physical Exam Vitals and nursing note reviewed.  Constitutional:      General: He is not in acute distress.    Appearance: He is well-developed. He is ill-appearing.  HENT:     Head: Normocephalic and atraumatic.  Eyes:     Extraocular Movements: Extraocular movements intact.     Conjunctiva/sclera: Conjunctivae normal.  Cardiovascular:     Rate and Rhythm: Normal rate and regular rhythm.     Pulses: Normal pulses.     Heart sounds: Normal heart sounds. No murmur heard.    Comments: Patient borderline tachycardic on exam. Pulmonary:     Effort: Pulmonary effort is normal. Tachypnea present. No respiratory distress.     Breath sounds: Examination of the right-upper field reveals wheezing. Examination of the left-upper field reveals wheezing. Examination of the right-middle field reveals wheezing. Examination of the left-middle field reveals wheezing. Examination of the right-lower field reveals wheezing. Examination of the  left-lower field reveals wheezing. Wheezing present.  Abdominal:     Palpations: Abdomen is soft.     Tenderness: There is no abdominal tenderness.  Musculoskeletal:        General: No swelling.     Cervical back: Neck supple.     Right lower leg: No edema.     Left lower leg: No edema.  Skin:    General: Skin is warm and dry.     Capillary Refill: Capillary refill takes less than 2 seconds.  Neurological:     General: No focal deficit present.     Mental Status: He is alert and oriented to person, place, and time.  Psychiatric:        Mood and Affect: Mood normal.     ED Results / Procedures / Treatments  Labs (all labs ordered are listed, but only abnormal results are displayed) Labs Reviewed  RESP PANEL BY RT-PCR (RSV, FLU A&B, COVID)  RVPGX2 - Abnormal; Notable for the following components:      Result Value   Influenza A by PCR POSITIVE (*)    All other components within normal limits  BRAIN NATRIURETIC PEPTIDE - Abnormal; Notable for the following components:   B Natriuretic Peptide 157.2 (*)    All other components within normal limits  COMPREHENSIVE METABOLIC PANEL - Abnormal; Notable for the following components:   Chloride 95 (*)    Glucose, Bld 179 (*)    BUN 50 (*)    Creatinine, Ser 1.87 (*)    Albumin 3.4 (*)    GFR, Estimated 36 (*)    Anion gap 16 (*)    All other components within normal limits  CBC WITH DIFFERENTIAL/PLATELET  TROPONIN I (HIGH SENSITIVITY)  TROPONIN I (HIGH SENSITIVITY)    EKG None  Radiology No results found.  Procedures Procedures    Medications Ordered in ED Medications  oseltamivir (TAMIFLU) capsule 75 mg (has no administration in time range)  ipratropium-albuterol (DUONEB) 0.5-2.5 (3) MG/3ML nebulizer solution 3 mL (3 mLs Nebulization Given 01/10/24 2136)  methylPREDNISolone sodium succinate (SOLU-MEDROL) 125 mg/2 mL injection 125 mg (125 mg Intravenous Given 01/10/24 2136)    ED Course/ Medical Decision Making/ A&P                                  Medical Decision Making Amount and/or Complexity of Data Reviewed Labs: ordered. Radiology: ordered.   This patient presents to the ED with chief complaint(s) of shortness of breath and cough with pertinent past medical history of COPD, CHF, pneumonia which further complicates the presenting complaint. The complaint involves an extensive differential diagnosis and also carries with it a high risk of complications and morbidity.    The differential diagnosis includes COVID, flu, RSV, pneumonia, URI, COPD exacerbation, CHF exacerbation  Additional history obtained: Records reviewed Primary Care Documents  ED Course and Reassessment:   Independent labs interpretation:  The following labs were independently interpreted:  CBC: Notable findings BMP: Mildly decreased chloride, elevated bun, elevated creatinine, BNP: 157 Troponin: 11 EKG: Sinus with borderline T wave abnormality Respiratory panel: Flu A+  Independent visualization of imaging: - I independently visualized the following imaging with scope of interpretation limited to determining acute life threatening conditions related to emergency care: Chest x-ray, which revealed Patchy airspace disease at the right lung base, possible atelectasis or infiltrate, Suspected trace right pleural effusion.   Consultation: - Consulted or discussed management/test interpretation w/ external professional: Hospitalist, Dr. Antionette Char for admission for influenza A and he was agreeable to admission  Consideration for admission or further workup: Patient to be admitted for influenza A        Final Clinical Impression(s) / ED Diagnoses Final diagnoses:  Influenza A    Rx / DC Orders ED Discharge Orders     None         Dolphus Jenny, PA-C 01/10/24 2311    Lonell Grandchild, MD 01/10/24 [phone]

## 2024-01-10 NOTE — ED Triage Notes (Signed)
Patient BIB EMS from home. He woke up this morning ShOB with productive cough with yellow phlegm.  Patient has hx of COPD, CHF, pneumonia.  Patient is alert/oriented, unsteady gait.  Patient is on home oxygen 2L [place].
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
## 2024-01-10 NOTE — ED Triage Notes (Signed)
Patient states dry cough with foamy spit that comes up.

## 2024-01-11 ENCOUNTER — Other Ambulatory Visit: Payer: Self-pay

## 2024-01-11 DIAGNOSIS — Z961 Presence of intraocular lens: Secondary | ICD-10-CM | POA: Diagnosis present

## 2024-01-11 DIAGNOSIS — E1122 Type 2 diabetes mellitus with diabetic chronic kidney disease: Secondary | ICD-10-CM | POA: Diagnosis present

## 2024-01-11 DIAGNOSIS — R630 Anorexia: Secondary | ICD-10-CM | POA: Diagnosis present

## 2024-01-11 DIAGNOSIS — M810 Age-related osteoporosis without current pathological fracture: Secondary | ICD-10-CM | POA: Diagnosis present

## 2024-01-11 DIAGNOSIS — J101 Influenza due to other identified influenza virus with other respiratory manifestations: Secondary | ICD-10-CM | POA: Diagnosis present

## 2024-01-11 DIAGNOSIS — I5032 Chronic diastolic (congestive) heart failure: Secondary | ICD-10-CM | POA: Diagnosis not present

## 2024-01-11 DIAGNOSIS — R1310 Dysphagia, unspecified: Secondary | ICD-10-CM | POA: Diagnosis present

## 2024-01-11 DIAGNOSIS — J9612 Chronic respiratory failure with hypercapnia: Secondary | ICD-10-CM | POA: Diagnosis present

## 2024-01-11 DIAGNOSIS — N2581 Secondary hyperparathyroidism of renal origin: Secondary | ICD-10-CM | POA: Diagnosis present

## 2024-01-11 DIAGNOSIS — E1151 Type 2 diabetes mellitus with diabetic peripheral angiopathy without gangrene: Secondary | ICD-10-CM | POA: Diagnosis present

## 2024-01-11 DIAGNOSIS — Z1152 Encounter for screening for COVID-19: Secondary | ICD-10-CM | POA: Diagnosis not present

## 2024-01-11 DIAGNOSIS — J9611 Chronic respiratory failure with hypoxia: Secondary | ICD-10-CM | POA: Diagnosis present

## 2024-01-11 DIAGNOSIS — Z6836 Body mass index (BMI) 36.0-36.9, adult: Secondary | ICD-10-CM | POA: Diagnosis not present

## 2024-01-11 DIAGNOSIS — J4489 Other specified chronic obstructive pulmonary disease: Secondary | ICD-10-CM | POA: Diagnosis not present

## 2024-01-11 DIAGNOSIS — Y831 Surgical operation with implant of artificial internal device as the cause of abnormal reaction of the patient, or of later complication, without mention of misadventure at the time of the procedure: Secondary | ICD-10-CM | POA: Diagnosis present

## 2024-01-11 DIAGNOSIS — I480 Paroxysmal atrial fibrillation: Secondary | ICD-10-CM | POA: Diagnosis present

## 2024-01-11 DIAGNOSIS — N1832 Chronic kidney disease, stage 3b: Secondary | ICD-10-CM | POA: Diagnosis present

## 2024-01-11 DIAGNOSIS — I2510 Atherosclerotic heart disease of native coronary artery without angina pectoris: Secondary | ICD-10-CM | POA: Diagnosis present

## 2024-01-11 DIAGNOSIS — J309 Allergic rhinitis, unspecified: Secondary | ICD-10-CM | POA: Diagnosis present

## 2024-01-11 DIAGNOSIS — I5042 Chronic combined systolic (congestive) and diastolic (congestive) heart failure: Secondary | ICD-10-CM | POA: Diagnosis present

## 2024-01-11 DIAGNOSIS — Z7901 Long term (current) use of anticoagulants: Secondary | ICD-10-CM | POA: Diagnosis not present

## 2024-01-11 DIAGNOSIS — I130 Hypertensive heart and chronic kidney disease with heart failure and stage 1 through stage 4 chronic kidney disease, or unspecified chronic kidney disease: Secondary | ICD-10-CM | POA: Diagnosis present

## 2024-01-11 DIAGNOSIS — E1165 Type 2 diabetes mellitus with hyperglycemia: Secondary | ICD-10-CM | POA: Diagnosis present

## 2024-01-11 DIAGNOSIS — Z794 Long term (current) use of insulin: Secondary | ICD-10-CM | POA: Diagnosis not present

## 2024-01-11 DIAGNOSIS — K9509 Other complications of gastric band procedure: Secondary | ICD-10-CM | POA: Diagnosis present

## 2024-01-11 DIAGNOSIS — E785 Hyperlipidemia, unspecified: Secondary | ICD-10-CM | POA: Diagnosis present

## 2024-01-11 DIAGNOSIS — J440 Chronic obstructive pulmonary disease with acute lower respiratory infection: Secondary | ICD-10-CM | POA: Diagnosis present

## 2024-01-11 LAB — CBC
HCT: 40.2 % (ref 39.0–52.0)
Hemoglobin: 13.1 g/dL (ref 13.0–17.0)
MCH: 30.4 pg (ref 26.0–34.0)
MCHC: 32.6 g/dL (ref 30.0–36.0)
MCV: 93.3 fL (ref 80.0–100.0)
Platelets: 182 10*3/uL (ref 150–400)
RBC: 4.31 MIL/uL (ref 4.22–5.81)
RDW: 13.5 % (ref 11.5–15.5)
WBC: 7.8 10*3/uL (ref 4.0–10.5)
nRBC: 0 % (ref 0.0–0.2)

## 2024-01-11 LAB — BASIC METABOLIC PANEL
Anion gap: 14 (ref 5–15)
BUN: 47 mg/dL — ABNORMAL HIGH (ref 8–23)
CO2: 25 mmol/L (ref 22–32)
Calcium: 8.5 mg/dL — ABNORMAL LOW (ref 8.9–10.3)
Chloride: 95 mmol/L — ABNORMAL LOW (ref 98–111)
Creatinine, Ser: 1.91 mg/dL — ABNORMAL HIGH (ref 0.61–1.24)
GFR, Estimated: 35 mL/min — ABNORMAL LOW (ref >60)
Glucose, Bld: 398 mg/dL — ABNORMAL HIGH (ref 70–99)
Potassium: 3.7 mmol/L (ref 3.5–5.1)
Sodium: 134 mmol/L — ABNORMAL LOW (ref 135–145)

## 2024-01-11 LAB — CBG MONITORING, ED
Glucose-Capillary: 152 mg/dL — ABNORMAL HIGH (ref 70–99)
Glucose-Capillary: 311 mg/dL — ABNORMAL HIGH (ref 70–99)
Glucose-Capillary: 416 mg/dL — ABNORMAL HIGH (ref 70–99)

## 2024-01-11 LAB — GLUCOSE, CAPILLARY
Glucose-Capillary: 401 mg/dL — ABNORMAL HIGH (ref 70–99)
Glucose-Capillary: 278 mg/dL — ABNORMAL HIGH (ref 70–99)
Glucose-Capillary: 274 mg/dL — ABNORMAL HIGH (ref 70–99)
Glucose-Capillary: 268 mg/dL — ABNORMAL HIGH (ref 70–99)

## 2024-01-11 LAB — PROCALCITONIN: Procalcitonin: 0.1 ng/mL

## 2024-01-11 MED ORDER — FLUTICASONE FUROATE-VILANTEROL 200-25 MCG/ACT IN AEPB
1.0000 | INHALATION_SPRAY | Freq: Every day | RESPIRATORY_TRACT | Status: DC
  Administered 2024-01-11 – 2024-01-15 (×5): 1 via RESPIRATORY_TRACT
  Filled 2024-01-11: qty 28

## 2024-01-11 MED ORDER — ENSURE ENLIVE PO LIQD
237.0000 mL | Freq: Two times a day (BID) | ORAL | Status: DC
Start: 2024-01-12 — End: 2024-01-15
  Administered 2024-01-12 – 2024-01-15 (×6): 237 mL via ORAL

## 2024-01-11 MED ORDER — GUAIFENESIN ER 600 MG PO TB12
1200.0000 mg | ORAL_TABLET | Freq: Two times a day (BID) | ORAL | Status: DC
  Administered 2024-01-11 – 2024-01-15 (×9): 1200 mg via ORAL
  Filled 2024-01-11 (×9): qty 2

## 2024-01-11 MED ORDER — PREDNISONE 10 MG PO TABS
40.0000 mg | ORAL_TABLET | Freq: Every day | ORAL | Status: AC
  Administered 2024-01-12 – 2024-01-14 (×3): 40 mg via ORAL
  Filled 2024-01-11 (×3): qty 4

## 2024-01-11 MED ORDER — PANTOPRAZOLE SODIUM 40 MG PO TBEC
40.0000 mg | DELAYED_RELEASE_TABLET | Freq: Every day | ORAL | Status: DC
  Administered 2024-01-11 – 2024-01-15 (×5): 40 mg via ORAL
  Filled 2024-01-11 (×5): qty 1

## 2024-01-11 MED ORDER — ROSUVASTATIN CALCIUM 20 MG PO TABS
20.0000 mg | ORAL_TABLET | Freq: Every day | ORAL | Status: DC
  Administered 2024-01-11 – 2024-01-15 (×5): 20 mg via ORAL
  Filled 2024-01-11 (×5): qty 1

## 2024-01-11 MED ORDER — CARVEDILOL 25 MG PO TABS
25.0000 mg | ORAL_TABLET | Freq: Two times a day (BID) | ORAL | Status: DC
  Administered 2024-01-11 – 2024-01-15 (×9): 25 mg via ORAL
  Filled 2024-01-11 (×2): qty 1
  Filled 2024-01-11: qty 2
  Filled 2024-01-11 (×6): qty 1

## 2024-01-11 MED ORDER — INSULIN ASPART 100 UNIT/ML IJ SOLN
0.0000 [IU] | Freq: Every day | INTRAMUSCULAR | Status: DC
  Administered 2024-01-12: 3 [IU] via SUBCUTANEOUS
  Administered 2024-01-12 – 2024-01-13 (×2): 4 [IU] via SUBCUTANEOUS
  Administered 2024-01-14: 2 [IU] via SUBCUTANEOUS

## 2024-01-11 MED ORDER — IPRATROPIUM BROMIDE 0.02 % IN SOLN
0.5000 mg | Freq: Three times a day (TID) | RESPIRATORY_TRACT | Status: DC
  Administered 2024-01-11 – 2024-01-12 (×6): 0.5 mg via RESPIRATORY_TRACT
  Filled 2024-01-11 (×6): qty 2.5

## 2024-01-11 MED ORDER — APIXABAN 2.5 MG PO TABS
2.5000 mg | ORAL_TABLET | Freq: Two times a day (BID) | ORAL | Status: DC
  Administered 2024-01-11 – 2024-01-15 (×10): 2.5 mg via ORAL
  Filled 2024-01-11 (×10): qty 1

## 2024-01-11 MED ORDER — LORATADINE 10 MG PO TABS
10.0000 mg | ORAL_TABLET | Freq: Every day | ORAL | Status: DC
  Administered 2024-01-11 – 2024-01-15 (×5): 10 mg via ORAL
  Filled 2024-01-11 (×5): qty 1

## 2024-01-11 MED ORDER — ALLOPURINOL 100 MG PO TABS
300.0000 mg | ORAL_TABLET | Freq: Every day | ORAL | Status: DC
Start: 2024-01-11 — End: 2024-01-15
  Administered 2024-01-11 – 2024-01-15 (×5): 300 mg via ORAL
  Filled 2024-01-11 (×5): qty 3

## 2024-01-11 MED ORDER — TORSEMIDE 20 MG PO TABS
60.0000 mg | ORAL_TABLET | Freq: Every day | ORAL | Status: DC
  Administered 2024-01-11 – 2024-01-15 (×5): 60 mg via ORAL
  Filled 2024-01-11 (×5): qty 3

## 2024-01-11 MED ORDER — TRAZODONE HCL 50 MG PO TABS
100.0000 mg | ORAL_TABLET | Freq: Every day | ORAL | Status: DC
Start: 2024-01-11 — End: 2024-01-15
  Administered 2024-01-11 – 2024-01-14 (×5): 100 mg via ORAL
  Filled 2024-01-11 (×5): qty 2

## 2024-01-11 MED ORDER — TRAZODONE HCL 50 MG PO TABS: 100.0000 mg | ORAL_TABLET | Freq: Every day | ORAL | Status: DC

## 2024-01-11 MED ORDER — INSULIN ASPART 100 UNIT/ML IJ SOLN
7.0000 [IU] | Freq: Once | INTRAMUSCULAR | Status: AC
  Administered 2024-01-11: 7 [IU] via SUBCUTANEOUS

## 2024-01-11 MED ORDER — SODIUM CHLORIDE 0.9 % IV SOLN
2.0000 g | INTRAVENOUS | Status: DC
  Administered 2024-01-11 – 2024-01-12 (×2): 2 g via INTRAVENOUS
  Filled 2024-01-11 (×2): qty 20

## 2024-01-11 MED ORDER — TIOTROPIUM BROMIDE MONOHYDRATE 2.5 MCG/ACT IN AERS: 1.0000 | INHALATION_SPRAY | Freq: Every day | RESPIRATORY_TRACT | Status: DC

## 2024-01-11 MED ORDER — LEVALBUTEROL HCL 0.63 MG/3ML IN NEBU
0.6300 mg | INHALATION_SOLUTION | Freq: Three times a day (TID) | RESPIRATORY_TRACT | Status: DC
  Administered 2024-01-11 – 2024-01-12 (×6): 0.63 mg via RESPIRATORY_TRACT
  Filled 2024-01-11 (×7): qty 3

## 2024-01-11 MED ORDER — ISOSORBIDE MONONITRATE ER 30 MG PO TB24
60.0000 mg | ORAL_TABLET | Freq: Every day | ORAL | Status: DC
  Administered 2024-01-11 – 2024-01-15 (×5): 60 mg via ORAL
  Filled 2024-01-11 (×5): qty 2

## 2024-01-11 MED ORDER — CITALOPRAM HYDROBROMIDE 20 MG PO TABS
10.0000 mg | ORAL_TABLET | Freq: Every day | ORAL | Status: DC
  Administered 2024-01-11 – 2024-01-15 (×5): 10 mg via ORAL
  Filled 2024-01-11 (×5): qty 1

## 2024-01-11 MED ORDER — INSULIN ASPART 100 UNIT/ML IJ SOLN
0.0000 [IU] | Freq: Three times a day (TID) | INTRAMUSCULAR | Status: DC
  Administered 2024-01-12: 9 [IU] via SUBCUTANEOUS
  Administered 2024-01-12: 2 [IU] via SUBCUTANEOUS
  Administered 2024-01-12: 5 [IU] via SUBCUTANEOUS
  Administered 2024-01-13: 3 [IU] via SUBCUTANEOUS
  Administered 2024-01-13 (×2): 7 [IU] via SUBCUTANEOUS
  Administered 2024-01-14: 5 [IU] via SUBCUTANEOUS
  Administered 2024-01-14 – 2024-01-15 (×2): 2 [IU] via SUBCUTANEOUS

## 2024-01-11 MED ORDER — INSULIN GLARGINE-YFGN 100 UNIT/ML ~~LOC~~ SOLN
10.0000 [IU] | Freq: Every day | SUBCUTANEOUS | Status: DC
Start: 2024-01-11 — End: 2024-01-12
  Administered 2024-01-11: 10 [IU] via SUBCUTANEOUS
  Filled 2024-01-11 (×2): qty 0.1

## 2024-01-11 MED ORDER — ALBUTEROL SULFATE (2.5 MG/3ML) 0.083% IN NEBU
2.5000 mg | INHALATION_SOLUTION | RESPIRATORY_TRACT | Status: DC | PRN
  Administered 2024-01-13 – 2024-01-14 (×2): 2.5 mg via RESPIRATORY_TRACT
  Filled 2024-01-11 (×2): qty 3

## 2024-01-11 MED ORDER — FINASTERIDE 5 MG PO TABS
5.0000 mg | ORAL_TABLET | Freq: Every day | ORAL | Status: DC
Start: 2024-01-11 — End: 2024-01-15
  Administered 2024-01-11 – 2024-01-15 (×5): 5 mg via ORAL
  Filled 2024-01-11 (×6): qty 1

## 2024-01-11 NOTE — ED Notes (Signed)
On 2L NC.

## 2024-01-11 NOTE — Plan of Care (Signed)

## 2024-01-11 NOTE — Progress Notes (Addendum)
PROGRESS NOTE    Rica Koyanagi.  TKZ:[id number] DOB: 06-30-1941 DOA: 01/10/2024 PCP: Loyola Mast, MD   Chief Complaint  Patient presents with   Shortness of Breath    Brief Narrative:  Patient is a 83 year old gentleman history of COPD, chronic respiratory failure on 2 L nasal cannula, diabetes mellitus, CAD, paroxysmal A-fib on Eliquis, history of CVA with left-sided weakness, chronic HFpEF presenting with cough and worsening shortness of breath, rhinorrhea and loose stools for a few days.  Patient noted to have worsening symptoms with dyspnea even at rest and subsequently presented to the ED.  Patient said not much relief from breathing treatments.  Patient seen in the ED chest x-ray done notable for trace right pleural effusion and patchy airspace disease could reflect atelectasis versus infiltrate.  Influenza A PCR noted to be positive.  Patient placed on Tamiflu, received a dose of IV Solu-Medrol and DuoNebs in the ED.   Assessment & Plan:   Principal Problem:   Influenza A Active Problems:   Essential hypertension   Coronary artery disease   Chronic diastolic heart failure (HCC)   Chronic respiratory failure with hypoxia and hypercapnia (HCC)   COPD with chronic bronchitis (HCC)   Chronic kidney disease, stage 3b (HCC)   History of stroke   PAF (paroxysmal atrial fibrillation) (HCC)   Type 2 diabetes mellitus with stage 3b chronic kidney disease, with long-term current use of insulin (HCC)  #1 influenza A -Patient with chronic underlying lung and heart disease, presenting with shortness of breath which had occurred at rest now with cough, rhinorrhea ongoing for the past few days. -Chest x-ray done with concerns for patchy airspace disease at the right lung base, possible atelectasis or infiltrate, suspected trace right pleural effusion. -Influenza A PCR positive. -Continue Tamiflu. -Place on scheduled Xopenex and Atrovent nebs, Claritin, Mucinex. -Continue Breo  Ellipta. -Supportive care.  2.  Chronic hypoxic respiratory failure on 2 L nasal cannula/COPD/?PNA -Patient with no significant expiratory wheezing noted on examination however states started to feel some improvement after receiving IV Solu-Medrol and nebulizer treatments in the ED. -Chest x-ray done with concerns of patchy airspace disease at the right lung base atelectasis versus infiltrate. -Due to abnormal chest x-ray we will check a urine Legionella antigen, urine pneumococcus antigen and placed empirically on IV Rocephin to complete a 5-day course of treatment. -Place on scheduled Xopenex and Atrovent nebs, Claritin, Mucinex. -Start empiric prednisone 40 mg daily x 3 days. -Continue Breo Ellipta.  3.  Chronic HFpEF -Euvolemic on examination. -Continue home regimen torsemide.  4.  CAD -Stable. -Continue beta-blocker and nitrates, statin.  5.  Type 2 diabetes mellitus -Hemoglobin A1c 6.4 (12/13/2023) -CBG elevated at 416 this morning likely secondary to IV Solu-Medrol patient received. -Start Semglee 10 units daily, continue SSI.  6.  Hyperlipidemia -Continue statin.  7.  Paroxysmal atrial fibrillation -Continue Coreg for rate control, Eliquis for anticoagulation.  8.  History of CVA -Continue Eliquis and Crestor for secondary stroke prophylaxis.  9.  CKD 3B -Serum creatinine noted at 1.87 up from 1.7 25 August 2023. -Follow.   DVT prophylaxis: Eliquis Code Status: Full Family Communication: Updated patient.  No family at bedside. Disposition: Likely home when clinically improved.  Status is: Observation The patient remains OBS appropriate and will d/c before 2 midnights.   Consultants:  None  Procedures:  Chest x-ray 01/10/2024   Antimicrobials:  Anti-infectives (From admission, onward)    Start     Dose/Rate Route Frequency Ordered  Stop   01/11/24 2315  oseltamivir (TAMIFLU) capsule 30 mg  Status:  Discontinued        30 mg Oral 2 times daily 01/10/24  2316 01/10/24 2318   01/11/24 1000  oseltamivir (TAMIFLU) capsule 30 mg        30 mg Oral 2 times daily 01/10/24 2318 01/16/24 0959   01/10/24 2315  oseltamivir (TAMIFLU) capsule 75 mg  Status:  Discontinued        75 mg Oral 2 times daily 01/10/24 2310 01/10/24 2311   01/10/24 2315  oseltamivir (TAMIFLU) capsule 75 mg        75 mg Oral  Once 01/10/24 2311 01/11/24 0003   01/10/24 2245  cefTRIAXone (ROCEPHIN) 1 g in sodium chloride 0.9 % 100 mL IVPB  Status:  Discontinued        1 g 200 mL/hr over 30 Minutes Intravenous  Once 01/10/24 2240 01/10/24 2309   01/10/24 2245  azithromycin (ZITHROMAX) 500 mg in sodium chloride 0.9 % 250 mL IVPB  Status:  Discontinued        500 mg 250 mL/hr over 60 Minutes Intravenous  Once 01/10/24 2240 01/10/24 2309         Subjective: Patient sitting up at the side of the bed getting a breathing treatment.  States feels a little bit better than he did on admission.  Stated he started feeling better after receiving IV Solu-Medrol and IV antibiotic in the ED.  Denies any chest pain.  Denies any productive cough.  Feels shortness of breath is improving since admission.  Objective: Vitals:   01/11/24 0700 01/11/24 0800 01/11/24 0804 01/11/24 1015  BP:  (!) 178/84    Pulse: 97 90    Resp: 19 (!) 21    Temp:    98.1 F (36.7 C)  TempSrc:    Oral  SpO2: 98% 99%    Weight:   93.9 kg   Height:   5\' 3"  (1.6 m)    No intake or output data in the 24 hours ending 01/11/24 1030 Filed Weights   01/11/24 0804  Weight: 93.9 kg    Examination:  General exam: Appears calm and comfortable  Respiratory system: Some decreased breath sounds in the bases.  No significant wheezing noted.  No rhonchi.  No crackles.  Fair air movement.  Currently receiving breathing treatment.   Cardiovascular system: S1 & S2 heard, RRR. No JVD, murmurs, rubs, gallops or clicks. No pedal edema. Gastrointestinal system: Abdomen is nondistended, soft and nontender. No organomegaly or  masses felt. Normal bowel sounds heard. Central nervous system: Alert and oriented. No focal neurological deficits. Extremities: Symmetric 5 x 5 power. Skin: No rashes, lesions or ulcers Psychiatry: Judgement and insight appear normal. Mood & affect appropriate.     Data Reviewed: I have personally reviewed following labs and imaging studies  CBC: Recent Labs  Lab 01/10/24 2131 01/11/24 0437  WBC 8.9 7.8  NEUTROABS 7.0  --   HGB 14.1 13.1  HCT 43.3 40.2  MCV 92.5 93.3  PLT 203 182    Basic Metabolic Panel: Recent Labs  Lab 01/10/24 2131 01/11/24 0437  NA 137 134*  K 3.7 3.7  CL 95* 95*  CO2 26 25  GLUCOSE 179* 398*  BUN 50* 47*  CREATININE 1.87* 1.91*  CALCIUM 9.2 8.5*    GFR: Estimated Creatinine Clearance: 30.8 mL/min (A) (by C-G formula based on SCr of 1.91 mg/dL (H)).  Liver Function Tests: Recent Labs  Lab 01/10/24  2131  AST 23  ALT 17  ALKPHOS 59  BILITOT 0.9  PROT 6.7  ALBUMIN 3.4*    CBG: Recent Labs  Lab 01/11/24 0003 01/11/24 0515 01/11/24 0757  GLUCAP 152* 416* 311*     Recent Results (from the past 240 hours)  Resp panel by RT-PCR (RSV, Flu A&B, Covid) Anterior Nasal Swab     Status: Abnormal   Collection Time: 01/10/24  9:31 PM   Specimen: Anterior Nasal Swab  Result Value Ref Range Status   SARS Coronavirus 2 by RT PCR NEGATIVE NEGATIVE Final   Influenza A by PCR POSITIVE (A) NEGATIVE Final   Influenza B by PCR NEGATIVE NEGATIVE Final    Comment: (NOTE) The Xpert Xpress SARS-CoV-2/FLU/RSV plus assay is intended as an aid in the diagnosis of influenza from Nasopharyngeal swab specimens and should not be used as a sole basis for treatment. Nasal washings and aspirates are unacceptable for Xpert Xpress SARS-CoV-2/FLU/RSV testing.  Fact Sheet for Patients: BloggerCourse.com  Fact Sheet for Healthcare Providers: SeriousBroker.it  This test is not yet approved or cleared by the  [place] FDA and has been authorized for detection and/or diagnosis of SARS-CoV-2 by FDA under an Emergency Use Authorization (EUA). This EUA will remain in effect (meaning this test can be used) for the duration of the COVID-19 declaration under Section 564(b)(1) of the Act, 21 U.S.C. section 360bbb-3(b)(1), unless the authorization is terminated or revoked.     Resp Syncytial Virus by PCR NEGATIVE NEGATIVE Final    Comment: (NOTE) Fact Sheet for Patients: BloggerCourse.com  Fact Sheet for Healthcare Providers: SeriousBroker.it  This test is not yet approved or cleared by the [place] FDA and has been authorized for detection and/or diagnosis of SARS-CoV-2 by FDA under an Emergency Use Authorization (EUA). This EUA will remain in effect (meaning this test can be used) for the duration of the COVID-19 declaration under Section 564(b)(1) of the Act, 21 U.S.C. section 360bbb-3(b)(1), unless the authorization is terminated or revoked.  Performed at [facility], [address] [place], [place] [address]          Radiology Studies: DG Chest 2 View Result Date: 01/10/2024 CLINICAL DATA:  Shortness of breath. EXAM: CHEST - 2 VIEW COMPARISON:  10/11/2022. FINDINGS: The heart is enlarged and the mediastinal contour is within normal limits. There is atherosclerotic calcification of the aorta. Patchy airspace disease is noted at the right lung base. There is a suspected trace right pleural effusion. Degenerative changes are noted in the thoracic spine. No acute osseous abnormality is seen. IMPRESSION: 1. Patchy airspace disease at the right lung base, possible atelectasis or infiltrate. 2. Suspected trace right pleural effusion. Electronically Signed   By: Thornell Sartorius M.D.   On: 01/10/2024 21:28        Scheduled Meds:  allopurinol  300 mg Oral Daily   apixaban  2.5 mg Oral BID   carvedilol  25 mg Oral BID WC    citalopram  10 mg Oral Daily   finasteride  5 mg Oral Daily   fluticasone furoate-vilanterol  1 puff Inhalation Daily   guaiFENesin  1,200 mg Oral BID   insulin aspart  0-5 Units Subcutaneous QHS   insulin aspart  0-6 Units Subcutaneous TID WC   insulin aspart  3 Units Subcutaneous TID WC   insulin glargine-yfgn  10 Units Subcutaneous Daily   ipratropium  0.5 mg Nebulization Q8H   isosorbide mononitrate  60 mg Oral Daily   levalbuterol  0.63 mg Nebulization Q8H   loratadine  10 mg Oral Daily   oseltamivir  30 mg Oral BID   pantoprazole  40 mg Oral Daily   rosuvastatin  20 mg Oral Daily   torsemide  60 mg Oral Daily   traZODone  100 mg Oral QHS   Continuous Infusions:   LOS: 0 days    Time spent: 40 minutes    Ramiro Harvest, MD [facility]   To contact the attending provider between 7A-7P or the covering provider during after hours 7P-7A, please log into the web site [email or web address] and access using universal [place] password for that web site. If you do not have the password, please call the hospital operator.  01/11/2024, 10:30 AM

## 2024-01-12 ENCOUNTER — Inpatient Hospital Stay (HOSPITAL_COMMUNITY): Payer: Medicare Other

## 2024-01-12 DIAGNOSIS — J101 Influenza due to other identified influenza virus with other respiratory manifestations: Secondary | ICD-10-CM | POA: Diagnosis not present

## 2024-01-12 DIAGNOSIS — J4489 Other specified chronic obstructive pulmonary disease: Secondary | ICD-10-CM | POA: Diagnosis not present

## 2024-01-12 DIAGNOSIS — I5032 Chronic diastolic (congestive) heart failure: Secondary | ICD-10-CM | POA: Diagnosis not present

## 2024-01-12 DIAGNOSIS — I480 Paroxysmal atrial fibrillation: Secondary | ICD-10-CM | POA: Diagnosis not present

## 2024-01-12 LAB — CBC
HCT: 37.9 % — ABNORMAL LOW (ref 39.0–52.0)
Hemoglobin: 12.7 g/dL — ABNORMAL LOW (ref 13.0–17.0)
MCH: 30.3 pg (ref 26.0–34.0)
MCHC: 33.5 g/dL (ref 30.0–36.0)
MCV: 90.5 fL (ref 80.0–100.0)
Platelets: 199 10*3/uL (ref 150–400)
RBC: 4.19 MIL/uL — ABNORMAL LOW (ref 4.22–5.81)
RDW: 13.4 % (ref 11.5–15.5)
WBC: 12.8 10*3/uL — ABNORMAL HIGH (ref 4.0–10.5)
nRBC: 0 % (ref 0.0–0.2)

## 2024-01-12 LAB — GLUCOSE, CAPILLARY
Glucose-Capillary: 186 mg/dL — ABNORMAL HIGH (ref 70–99)
Glucose-Capillary: 225 mg/dL — ABNORMAL HIGH (ref 70–99)
Glucose-Capillary: 262 mg/dL — ABNORMAL HIGH (ref 70–99)
Glucose-Capillary: 279 mg/dL — ABNORMAL HIGH (ref 70–99)
Glucose-Capillary: 304 mg/dL — ABNORMAL HIGH (ref 70–99)
Glucose-Capillary: 383 mg/dL — ABNORMAL HIGH (ref 70–99)

## 2024-01-12 LAB — BASIC METABOLIC PANEL
Anion gap: 13 (ref 5–15)
BUN: 50 mg/dL — ABNORMAL HIGH (ref 8–23)
CO2: 27 mmol/L (ref 22–32)
Calcium: 8.8 mg/dL — ABNORMAL LOW (ref 8.9–10.3)
Chloride: 98 mmol/L (ref 98–111)
Creatinine, Ser: 1.6 mg/dL — ABNORMAL HIGH (ref 0.61–1.24)
GFR, Estimated: 43 mL/min — ABNORMAL LOW (ref >60)
Glucose, Bld: 219 mg/dL — ABNORMAL HIGH (ref 70–99)
Potassium: 3.7 mmol/L (ref 3.5–5.1)
Sodium: 138 mmol/L (ref 135–145)

## 2024-01-12 MED ORDER — IPRATROPIUM BROMIDE 0.02 % IN SOLN
0.5000 mg | Freq: Three times a day (TID) | RESPIRATORY_TRACT | Status: DC
  Administered 2024-01-13: 0.5 mg via RESPIRATORY_TRACT
  Filled 2024-01-12: qty 2.5

## 2024-01-12 MED ORDER — INSULIN ASPART 100 UNIT/ML IJ SOLN
4.0000 [IU] | Freq: Three times a day (TID) | INTRAMUSCULAR | Status: DC
  Administered 2024-01-12 – 2024-01-13 (×3): 4 [IU] via SUBCUTANEOUS

## 2024-01-12 MED ORDER — INSULIN GLARGINE-YFGN 100 UNIT/ML ~~LOC~~ SOLN
16.0000 [IU] | Freq: Every day | SUBCUTANEOUS | Status: DC
  Administered 2024-01-12 – 2024-01-15 (×4): 16 [IU] via SUBCUTANEOUS
  Filled 2024-01-12 (×4): qty 0.16

## 2024-01-12 MED ORDER — LEVALBUTEROL HCL 0.63 MG/3ML IN NEBU
0.6300 mg | INHALATION_SOLUTION | Freq: Three times a day (TID) | RESPIRATORY_TRACT | Status: DC
  Administered 2024-01-13: 0.63 mg via RESPIRATORY_TRACT
  Filled 2024-01-12 (×3): qty 3

## 2024-01-12 NOTE — Progress Notes (Signed)
PROGRESS NOTE    [name].  WEX:[id number] DOB: [birth year] DOA: 01/10/2024 PCP: Loyola Mast, MD   Chief Complaint  Patient presents with   Shortness of Breath    Brief Narrative:  Patient is a 83 year old gentleman history of COPD, chronic respiratory failure on 2 L nasal cannula, diabetes mellitus, CAD, paroxysmal A-fib on Eliquis, history of CVA with left-sided weakness, chronic HFpEF presenting with cough and worsening shortness of breath, rhinorrhea and loose stools for a few days.  Patient noted to have worsening symptoms with dyspnea even at rest and subsequently presented to the ED.  Patient said not much relief from breathing treatments.  Patient seen in the ED chest x-ray done notable for trace right pleural effusion and patchy airspace disease could reflect atelectasis versus infiltrate.  Influenza A PCR noted to be positive.  Patient placed on Tamiflu, received a dose of IV Solu-Medrol and DuoNebs in the ED.   Assessment & Plan:   Principal Problem:   Influenza A Active Problems:   Essential hypertension   Coronary artery disease   Chronic diastolic heart failure (HCC)   Chronic respiratory failure with hypoxia and hypercapnia (HCC)   COPD with chronic bronchitis (HCC)   Chronic kidney disease, stage 3b (HCC)   History of stroke   PAF (paroxysmal atrial fibrillation) (HCC)   Type 2 diabetes mellitus with stage 3b chronic kidney disease, with long-term current use of insulin (HCC)  #1 influenza A -Patient with chronic underlying lung and heart disease, presenting with shortness of breath which had occurred at rest now with cough, rhinorrhea ongoing for the past few days. -Chest x-ray done with concerns for patchy airspace disease at the right lung base, possible atelectasis or infiltrate, suspected trace right pleural effusion. -Influenza A PCR positive. -Continue Tamiflu. -Continue scheduled Xopenex and Atrovent nebs, Claritin, Mucinex, Breo  Ellipta. -Supportive care.  2.  Chronic hypoxic respiratory failure on 2 L nasal cannula/COPD/?PNA -Patient with no significant expiratory wheezing noted on examination however states started to feel some improvement after receiving IV Solu-Medrol and nebulizer treatments in the ED. -Chest x-ray done with concerns of patchy airspace disease at the right lung base atelectasis versus infiltrate. -Due to abnormal chest x-ray urine Legionella antigen, urine pneumococcus antigen ordered and pending.   -Continue empiric IV Rocephin to complete a 5-day course of antibiotic treatment and could likely transition to oral antibiotics in the next 24 to 48 hours. -Continue scheduled Xopenex and Atrovent nebs, Claritin, Mucinex, prednisone 40 mg daily, Breo Ellipta.  3.  Chronic HFpEF -Euvolemic on examination. -Continue home regimen torsemide.  4.  CAD -Stable. -Continue beta-blocker and nitrates, statin.  5.  Type 2 diabetes mellitus -Hemoglobin A1c 6.4 (12/13/2023) -CBG elevated at 225 this morning likely secondary to IV Solu-Medrol patient received and current oral steroids.. -Increase Semglee to 16 units daily.   -Start NovoLog 4 units 3 times daily meal coverage.   -SSI.   6.  Hyperlipidemia -Statin.  7.  Paroxysmal atrial fibrillation -Continue Coreg for rate control.  Eliquis for anticoagulation.   8.  History of CVA -Continue Eliquis and Crestor for secondary stroke prophylaxis.   9.  CKD 3B -Serum creatinine noted at 1.60 from 1.87 up from 1.7 25 August 2023. -Follow.  10.  History of chronic esophageal dysphagia -Patient noted to have been seen by gastroenterology on 01/01/2024 for evaluation of esophageal dysphagia and stated underwent a barium esophagram 01/08/2024 and was to have referral to general surgery to evaluate to see  if Lap-Band may have slipped and for potential Lap-Band deflation pending barium esophagram and was wondering whether he could be seen by general surgery  while hospitalized. -Will discuss with general surgery.   DVT prophylaxis: Eliquis Code Status: Full Family Communication: Updated patient.  No family at bedside. Disposition: Likely home when clinically improved.  Status is: Inpatient    Consultants:  None  Procedures:  Chest x-ray 01/10/2024   Antimicrobials:  Anti-infectives (From admission, onward)    Start     Dose/Rate Route Frequency Ordered Stop   01/11/24 2315  oseltamivir (TAMIFLU) capsule 30 mg  Status:  Discontinued        30 mg Oral 2 times daily 01/10/24 2316 01/10/24 2318   01/11/24 1045  cefTRIAXone (ROCEPHIN) 2 g in sodium chloride 0.9 % 100 mL IVPB        2 g 200 mL/hr over 30 Minutes Intravenous Every 24 hours 01/11/24 1043 01/16/24 1044   01/11/24 1000  oseltamivir (TAMIFLU) capsule 30 mg        30 mg Oral 2 times daily 01/10/24 2318 01/16/24 0959   01/10/24 2315  oseltamivir (TAMIFLU) capsule 75 mg  Status:  Discontinued        75 mg Oral 2 times daily 01/10/24 2310 01/10/24 2311   01/10/24 2315  oseltamivir (TAMIFLU) capsule 75 mg        75 mg Oral  Once 01/10/24 2311 01/11/24 0003   01/10/24 2245  cefTRIAXone (ROCEPHIN) 1 g in sodium chloride 0.9 % 100 mL IVPB  Status:  Discontinued        1 g 200 mL/hr over 30 Minutes Intravenous  Once 01/10/24 2240 01/10/24 2309   01/10/24 2245  azithromycin (ZITHROMAX) 500 mg in sodium chloride 0.9 % 250 mL IVPB  Status:  Discontinued        500 mg 250 mL/hr over 60 Minutes Intravenous  Once 01/10/24 2240 01/10/24 2309         Subjective: Patient sleeping but arousable.  Denies any chest pain.  Feels shortness of breath is slowly improving.  No abdominal pain.  States was post to see general surgery in the outpatient steroid pain for evaluation of his Lap-Band after getting a barium swallow study and wondering whether he can be seen by general surgery while he is here.    Objective: Vitals:   01/12/24 0454 01/12/24 0633 01/12/24 0641 01/12/24 0847  BP: (!)  144/69   (!) 149/66  Pulse: 74   87  Resp: 18   20  Temp: 98 F (36.7 C)   97.9 F (36.6 C)  TempSrc:    Oral  SpO2: 100%  95% 96%  Weight:  92.5 kg    Height:        Intake/Output Summary (Last 24 hours) at 01/12/2024 1209 Last data filed at 01/12/2024 0851 Gross per 24 hour  Intake 609.36 ml  Output --  Net 609.36 ml   Filed Weights   01/11/24 0804 01/12/24 0633  Weight: 93.9 kg 92.5 kg    Examination:  General exam: NAD. Respiratory system: Some diffuse coarse breath sounds.  No wheezing.  No crackles.  Fair air movement.  Cardiovascular system: RRR no murmurs rubs or gallops.  No JVD.  No pitting lower extremity edema.   Gastrointestinal system: Abdomen soft, nontender, nondistended, positive bowel sounds.  No rebound.  No guarding.   Central nervous system: Alert and oriented. No focal neurological deficits. Extremities: Symmetric 5 x 5 power. Skin: No rashes,  lesions or ulcers Psychiatry: Judgement and insight appear normal. Mood & affect appropriate.     Data Reviewed: I have personally reviewed following labs and imaging studies  CBC: Recent Labs  Lab 01/10/24 2131 01/11/24 0437 01/12/24 0423  WBC 8.9 7.8 12.8*  NEUTROABS 7.0  --   --   HGB 14.1 13.1 12.7*  HCT 43.3 40.2 37.9*  MCV 92.5 93.3 90.5  PLT 203 182 199    Basic Metabolic Panel: Recent Labs  Lab 01/10/24 2131 01/11/24 0437 01/12/24 0423  NA 137 134* 138  K 3.7 3.7 3.7  CL 95* 95* 98  CO2 26 25 27   GLUCOSE 179* 398* 219*  BUN 50* 47* 50*  CREATININE 1.87* 1.91* 1.60*  CALCIUM 9.2 8.5* 8.8*    GFR: Estimated Creatinine Clearance: 36.4 mL/min (A) (by C-G formula based on SCr of 1.6 mg/dL (H)).  Liver Function Tests: Recent Labs  Lab 01/10/24 2131  AST 23  ALT 17  ALKPHOS 59  BILITOT 0.9  PROT 6.7  ALBUMIN 3.4*    CBG: Recent Labs  Lab 01/11/24 1957 01/11/24 2132 01/12/24 0040 01/12/24 0705 01/12/24 0812  GLUCAP 274* 268* 304* 225* 383*     Recent Results  (from the past 240 hours)  Resp panel by RT-PCR (RSV, Flu A&B, Covid) Anterior Nasal Swab     Status: Abnormal   Collection Time: 01/10/24  9:31 PM   Specimen: Anterior Nasal Swab  Result Value Ref Range Status   SARS Coronavirus 2 by RT PCR NEGATIVE NEGATIVE Final   Influenza A by PCR POSITIVE (A) NEGATIVE Final   Influenza B by PCR NEGATIVE NEGATIVE Final    Comment: (NOTE) The Xpert Xpress SARS-CoV-2/FLU/RSV plus assay is intended as an aid in the diagnosis of influenza from Nasopharyngeal swab specimens and should not be used as a sole basis for treatment. Nasal washings and aspirates are unacceptable for Xpert Xpress SARS-CoV-2/FLU/RSV testing.  Fact Sheet for Patients: BloggerCourse.com  Fact Sheet for Healthcare Providers: SeriousBroker.it  This test is not yet approved or cleared by the [place] FDA and has been authorized for detection and/or diagnosis of SARS-CoV-2 by FDA under an Emergency Use Authorization (EUA). This EUA will remain in effect (meaning this test can be used) for the duration of the COVID-19 declaration under Section 564(b)(1) of the Act, 21 U.S.C. section 360bbb-3(b)(1), unless the authorization is terminated or revoked.     Resp Syncytial Virus by PCR NEGATIVE NEGATIVE Final    Comment: (NOTE) Fact Sheet for Patients: BloggerCourse.com  Fact Sheet for Healthcare Providers: SeriousBroker.it  This test is not yet approved or cleared by the [place] FDA and has been authorized for detection and/or diagnosis of SARS-CoV-2 by FDA under an Emergency Use Authorization (EUA). This EUA will remain in effect (meaning this test can be used) for the duration of the COVID-19 declaration under Section 564(b)(1) of the Act, 21 U.S.C. section 360bbb-3(b)(1), unless the authorization is terminated or revoked.  Performed at [facility], [address] [place], [place] [address]          Radiology Studies: DG Chest 2 View Result Date: 01/10/2024 CLINICAL DATA:  Shortness of breath. EXAM: CHEST - 2 VIEW COMPARISON:  10/11/2022. FINDINGS: The heart is enlarged and the mediastinal contour is within normal limits. There is atherosclerotic calcification of the aorta. Patchy airspace disease is noted at the right lung base. There is a suspected trace right pleural effusion. Degenerative changes are noted in the thoracic  spine. No acute osseous abnormality is seen. IMPRESSION: 1. Patchy airspace disease at the right lung base, possible atelectasis or infiltrate. 2. Suspected trace right pleural effusion. Electronically Signed   By: Thornell Sartorius M.D.   On: 01/10/2024 21:28        Scheduled Meds:  allopurinol  300 mg Oral Daily   apixaban  2.5 mg Oral BID   carvedilol  25 mg Oral BID WC   citalopram  10 mg Oral Daily   feeding supplement  237 mL Oral BID BM   finasteride  5 mg Oral Daily   fluticasone furoate-vilanterol  1 puff Inhalation Daily   guaiFENesin  1,200 mg Oral BID   insulin aspart  0-5 Units Subcutaneous QHS   insulin aspart  0-9 Units Subcutaneous TID WC   insulin aspart  4 Units Subcutaneous TID WC   insulin glargine-yfgn  16 Units Subcutaneous Daily   ipratropium  0.5 mg Nebulization Q8H   isosorbide mononitrate  60 mg Oral Daily   levalbuterol  0.63 mg Nebulization Q8H   loratadine  10 mg Oral Daily   oseltamivir  30 mg Oral BID   pantoprazole  40 mg Oral Daily   predniSONE  40 mg Oral QAC breakfast   rosuvastatin  20 mg Oral Daily   torsemide  60 mg Oral Daily   traZODone  100 mg Oral QHS   Continuous Infusions:  cefTRIAXone (ROCEPHIN)  IV 2 g (01/12/24 0928)     LOS: 1 day    Time spent: 40 minutes    Ramiro Harvest, MD [facility]   To contact the attending provider between 7A-7P or the covering provider during after hours 7P-7A, please log into the web site [email or web address] and access  using universal [place] password for that web site. If you do not have the password, please call the hospital operator.  01/12/2024, 12:09 PM

## 2024-01-12 NOTE — Progress Notes (Signed)
   01/12/24 2249  Respiratory Severity Assessment  $ Protocol Assessment  Yes  Heart Rate 0  Breath Sounds 2  Respiratory Pattern 0  Cough 0  Chest X Ray 2  O2 Requirements/ Pulse Ox Sat(%) 1  Mental Status 0  Dyspnea 2  Score Total 7  Aerosolized Bronchodilators  Aerosolized bronchodilator indications Aerosolized bronchodilator indicated  Medication Plan of Care Hand held neb treatment  Oxygen Therapy  Oxygen therapy indications Oxygen therapy indicated  Oxygen Plan of care Nasal cannula  Respiratory Therapy Follow Up Assessment  Assessment follow up date 01/13/24   Pt states that his breathing is feeling better but not where it needs to be. BBS diminished with exp wheezes.  Pt is 97% on 2L (home regimen). Pt takes spiriva every day, albuterol MDI PRN and Wixela at home. Ordered Xopenex 0.63 and atrovent Q8 here. I am changing to TID and Q4PRN, so that he is able to rest at night.   Reassess 01/13/24  Nelda Marseille
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
## 2024-01-13 DIAGNOSIS — J101 Influenza due to other identified influenza virus with other respiratory manifestations: Secondary | ICD-10-CM | POA: Diagnosis not present

## 2024-01-13 DIAGNOSIS — I480 Paroxysmal atrial fibrillation: Secondary | ICD-10-CM | POA: Diagnosis not present

## 2024-01-13 DIAGNOSIS — J4489 Other specified chronic obstructive pulmonary disease: Secondary | ICD-10-CM | POA: Diagnosis not present

## 2024-01-13 DIAGNOSIS — I5032 Chronic diastolic (congestive) heart failure: Secondary | ICD-10-CM | POA: Diagnosis not present

## 2024-01-13 LAB — CBC
HCT: 38.8 % — ABNORMAL LOW (ref 39.0–52.0)
Hemoglobin: 12.7 g/dL — ABNORMAL LOW (ref 13.0–17.0)
MCH: 30.6 pg (ref 26.0–34.0)
MCHC: 32.7 g/dL (ref 30.0–36.0)
MCV: 93.5 fL (ref 80.0–100.0)
Platelets: 200 10*3/uL (ref 150–400)
RBC: 4.15 MIL/uL — ABNORMAL LOW (ref 4.22–5.81)
RDW: 13.6 % (ref 11.5–15.5)
WBC: 12.4 10*3/uL — ABNORMAL HIGH (ref 4.0–10.5)
nRBC: 0 % (ref 0.0–0.2)

## 2024-01-13 LAB — GLUCOSE, CAPILLARY
Glucose-Capillary: 145 mg/dL — ABNORMAL HIGH (ref 70–99)
Glucose-Capillary: 203 mg/dL — ABNORMAL HIGH (ref 70–99)
Glucose-Capillary: 320 mg/dL — ABNORMAL HIGH (ref 70–99)
Glucose-Capillary: 342 mg/dL — ABNORMAL HIGH (ref 70–99)
Glucose-Capillary: 344 mg/dL — ABNORMAL HIGH (ref 70–99)

## 2024-01-13 LAB — BASIC METABOLIC PANEL
Anion gap: 14 (ref 5–15)
BUN: 50 mg/dL — ABNORMAL HIGH (ref 8–23)
CO2: 27 mmol/L (ref 22–32)
Calcium: 8.7 mg/dL — ABNORMAL LOW (ref 8.9–10.3)
Chloride: 97 mmol/L — ABNORMAL LOW (ref 98–111)
Creatinine, Ser: 1.45 mg/dL — ABNORMAL HIGH (ref 0.61–1.24)
GFR, Estimated: 48 mL/min — ABNORMAL LOW (ref >60)
Glucose, Bld: 192 mg/dL — ABNORMAL HIGH (ref 70–99)
Potassium: 3.7 mmol/L (ref 3.5–5.1)
Sodium: 138 mmol/L (ref 135–145)

## 2024-01-13 MED ORDER — AMOXICILLIN-POT CLAVULANATE 875-125 MG PO TABS
1.0000 | ORAL_TABLET | Freq: Two times a day (BID) | ORAL | Status: DC
  Administered 2024-01-13 – 2024-01-15 (×5): 1 via ORAL
  Filled 2024-01-13 (×5): qty 1

## 2024-01-13 MED ORDER — INSULIN ASPART 100 UNIT/ML IJ SOLN
6.0000 [IU] | Freq: Three times a day (TID) | INTRAMUSCULAR | Status: DC
  Administered 2024-01-13 – 2024-01-15 (×5): 6 [IU] via SUBCUTANEOUS

## 2024-01-13 MED ORDER — HYDRALAZINE HCL 20 MG/ML IJ SOLN
5.0000 mg | Freq: Four times a day (QID) | INTRAMUSCULAR | Status: DC | PRN
  Administered 2024-01-14 (×2): 5 mg via INTRAVENOUS
  Filled 2024-01-13 (×2): qty 1

## 2024-01-13 NOTE — Progress Notes (Signed)
Patients BP 166/75. No PRN avail. Sent page to night team

## 2024-01-13 NOTE — TOC CM/SW Note (Signed)
Transition of Care [facility]) - Inpatient Brief Assessment   Patient Details  Name: [name]. MRN: [id number] Date of Birth: [birth year]  Transition of Care [facility]) CM/SW Contact:    Harriet Masson, RN Phone Number: 01/13/2024, 3:35 PM   Clinical Narrative: Patient admitted for Shortness of breath.  Patient currently on RA. PT, recs no follow up. TOC following.    Transition of Care Asessment: Insurance and Status: Insurance coverage has been reviewed Patient has primary care physician: Yes Home environment has been reviewed: safe to discharge home Prior level of function:: independent Prior/Current Home Services: No current home services Social Drivers of Health Review: SDOH reviewed no interventions necessary Readmission risk has been reviewed: Yes Transition of care needs: no transition of care needs at this time

## 2024-01-13 NOTE — Evaluation (Signed)
Physical Therapy Evaluation and Discharge Patient Details Name: [name]. MRN: [id number] DOB: 1941/10/20 Today's Date: 01/13/2024  History of Present Illness  83 y.o. male presents to ED with cough and shortness of breath saturating in mid 90s on 2L O2. Chest x-ray notable for trace right pleural effusion and patchy airspace disease which could reflect atelectasis or infiltrate. Positive for influenza A. WUJ:WJXB, chronic respiratory failure, diabetes mellitus, CAD, PAF on Eliquis, history of CVA with left-sided weakness, and chronic HFpEF, Lap-Band  Clinical Impression  PTA pt living with his wife and adult bed bound daughter in a single story home with ramped entrance. Pt reports he could walk around the house without AD, needed walker for limited community distances and needed to use his transport chair for appointments (his nephew would take him). Pt is currently limited in safe mobility by increases DoE and generalized weakness. Pt is mod I for bed mobility and transfers and independent with walking to and from the bathroom. Pt has level of assistance needed at home and is close to his baseline level of function. Pt has no PT needs at discharge or acutely. PT will refer to Mobility Specialist to maintain current level of function.      If plan is discharge home, recommend the following: A lot of help with bathing/dressing/bathroom;Assistance with cooking/housework;Direct supervision/assist for medications management;Assist for transportation;Help with stairs or ramp for entrance   Can travel by private vehicle    Yes     Equipment Recommendations None recommended by PT     Functional Status Assessment Patient has had a recent decline in their functional status and demonstrates the ability to make significant improvements in function in a reasonable and predictable amount of time.     Precautions / Restrictions Precautions Precautions: None      Mobility  Bed  Mobility Overal bed mobility: Modified Independent             General bed mobility comments: HoB elevated, and use of bed rail to pull to EoB    Transfers Overall transfer level: Modified independent Equipment used: None               General transfer comment: good power up and steady with hand on bedrail    Ambulation/Gait Ambulation/Gait assistance: Independent, Supervision Gait Distance (Feet): 10 Feet (x3) Assistive device: None Gait Pattern/deviations: Step-through pattern, Decreased step length - right, Decreased step length - left, Wide base of support Gait velocity: slowed Gait velocity interpretation: <1.31 ft/sec, indicative of household ambulator   General Gait Details: independent for ambulation into and out of bathroom, Surgical PA arrived and asked to remove fluid from pt LapBAND, afterward pt with grimace, reporting not feeling well like "heartburn" in his abdomen, able to walk around the foot of the bed to sit in recliner with UE support on foot board      Balance Overall balance assessment: Needs assistance Sitting-balance support: No upper extremity supported, Feet supported Sitting balance-Leahy Scale: Normal     Standing balance support: No upper extremity supported, During functional activity, Single extremity supported Standing balance-Leahy Scale: Fair Standing balance comment: benefits from UE support                             Pertinent Vitals/Pain Pain Assessment Pain Assessment: Faces Faces Pain Scale: Hurts little more Pain Location: abdomen with removal of fluid from his LapBAND Pain Descriptors / Indicators: Grimacing, Guarding Pain Intervention(s): Limited  activity within patient's tolerance, Monitored during session, Repositioned    Home Living Family/patient expects to be discharged to:: Private residence Living Arrangements: Spouse/significant other Available Help at Discharge: Family;Available 24  hours/day Type of Home: House Home Access: Ramped entrance       Home Layout: One level Home Equipment: Agricultural consultant (2 wheels);Transport chair;Shower seat      Prior Function Prior Level of Function : Independent/Modified Independent             Mobility Comments: independent in home, limited community distances with RW, transport chair for longer distances ADLs Comments: sits to shower, he and his wife take care of their bed bound adult daughter with MS     Extremity/Trunk Assessment   Upper Extremity Assessment Upper Extremity Assessment: Defer to OT evaluation    Lower Extremity Assessment Lower Extremity Assessment: Generalized weakness    Cervical / Trunk Assessment Cervical / Trunk Assessment: Kyphotic  Communication   Communication Communication: No apparent difficulties  Cognition Arousal: Alert Behavior During Therapy: WFL for tasks assessed/performed Overall Cognitive Status: Within Functional Limits for tasks assessed                                          General Comments General comments (skin integrity, edema, etc.): on 2L O2 via [place] on entry, reports he has needed it for the last week or so for comfort, SpO2 98%O2, educated on supplemental O2 use when not needed, SpO2 on RA 93%O2, pt requested supplemental O2 back after Lap-BAND drainage.        Assessment/Plan    PT Assessment Patient does not need any further PT services         PT Goals (Current goals can be found in the Care Plan section)  Acute Rehab PT Goals PT Goal Formulation: With patient Time For Goal Achievement: 01/27/24 Potential to Achieve Goals: Good     AM-PAC PT "6 Clicks" Mobility  Outcome Measure Help needed turning from your back to your side while in a flat bed without using bedrails?: None Help needed moving from lying on your back to sitting on the side of a flat bed without using bedrails?: A Little Help needed moving to and from a bed to a  chair (including a wheelchair)?: None Help needed standing up from a chair using your arms (e.g., wheelchair or bedside chair)?: None Help needed to walk in hospital room?: None   6 Click Score: 19    End of Session   Activity Tolerance: Patient tolerated treatment well Patient left: in chair;with call bell/phone within reach Nurse Communication: Mobility status PT Visit Diagnosis: Other abnormalities of gait and mobility (R26.89)    Time: 1350-1413 PT Time Calculation (min) (ACUTE ONLY): 23 min   Charges:   PT Evaluation $PT Eval Low Complexity: 1 Low PT Treatments $Therapeutic Activity: 8-22 mins PT General Charges $$ ACUTE PT VISIT: 1 Visit         Amram Maya B. Beverely Risen PT, DPT Acute Rehabilitation Services Please use secure chat or  Call Office [phone]   Elon Alas [facility] 01/13/2024, 2:41 PM

## 2024-01-13 NOTE — Inpatient Diabetes Management (Signed)
Inpatient Diabetes Program Recommendations  AACE/ADA: New Consensus Statement on Inpatient Glycemic Control (2015)  Target Ranges:  Prepandial:   less than 140 mg/dL      Peak postprandial:   less than 180 mg/dL (1-2 hours)      Critically ill patients:  140 - 180 mg/dL   Lab Results  Component Value Date   GLUCAP 203 (H) 01/13/2024   HGBA1C 6.4 12/13/2023    Review of Glycemic Control  Latest Reference Range & Units 01/12/24 08:12 01/12/24 12:38 01/12/24 16:00 01/12/24 20:25 01/13/24 08:04  Glucose-Capillary 70 - 99 mg/dL 660 (H) 630 (H) 160 (H) 279 (H) 203 (H)  (H): Data is abnormally high  Diabetes history: DM2 Outpatient Diabetes medications:  Trulicity 4.5 mg weekly Farxiga 10 mg every day Novolog 7-9 units TID Current orders for Inpatient glycemic control:  Semglee 16 units every day Novolog 0-9 units TID and 0-5 units at bedtime Novolog 4 units TID Prednisone 40 mg QD  Inpatient Diabetes Program Recommendations:    Please consider:  Semglee 20 units QAM   Will continue to follow while inpatient.  Thank you, Dulce Sellar, MSN, CDCES Diabetes Coordinator Inpatient Diabetes Program [phone] (team pager from 8a-5p)

## 2024-01-13 NOTE — Progress Notes (Signed)
OT Cancellation Note  Patient Details Name: [name]. MRN: [id number] DOB: 1941-07-04   Cancelled Treatment:    Reason Eval/Treat Not Completed: OT screened, no needs identified, will sign off   Limmie Patricia, OTR/L,CBIS  Supplemental OT - [facility] Secure Chat Preferred   01/13/2024, 4:14 PM

## 2024-01-13 NOTE — Progress Notes (Signed)
PROGRESS NOTE    [name].  NUU:[id number] DOB: 1940-12-25 DOA: 01/10/2024 PCP: Loyola Mast, MD   Chief Complaint  Patient presents with   Shortness of Breath    Brief Narrative:  Patient is a 83 year old gentleman history of COPD, chronic respiratory failure on 2 L nasal cannula, diabetes mellitus, CAD, paroxysmal A-fib on Eliquis, history of CVA with left-sided weakness, chronic HFpEF presenting with cough and worsening shortness of breath, rhinorrhea and loose stools for a few days.  Patient noted to have worsening symptoms with dyspnea even at rest and subsequently presented to the ED.  Patient said not much relief from breathing treatments.  Patient seen in the ED chest x-ray done notable for trace right pleural effusion and patchy airspace disease could reflect atelectasis versus infiltrate.  Influenza A PCR noted to be positive.  Patient placed on Tamiflu, received a dose of IV Solu-Medrol and DuoNebs in the ED.   Assessment & Plan:   Principal Problem:   Influenza A Active Problems:   Essential hypertension   Coronary artery disease   Chronic diastolic heart failure (HCC)   Chronic respiratory failure with hypoxia and hypercapnia (HCC)   COPD with chronic bronchitis (HCC)   Chronic kidney disease, stage 3b (HCC)   History of stroke   PAF (paroxysmal atrial fibrillation) (HCC)   Type 2 diabetes mellitus with stage 3b chronic kidney disease, with long-term current use of insulin (HCC)  #1 influenza A -Patient with chronic underlying lung and heart disease, presenting with shortness of breath which had occurred at rest now with cough, rhinorrhea ongoing for the past few days. -Chest x-ray done with concerns for patchy airspace disease at the right lung base, possible atelectasis or infiltrate, suspected trace right pleural effusion. -Influenza A PCR positive. -Continue Tamiflu. -Continue scheduled Xopenex and Atrovent nebs, Claritin, Mucinex, Breo  Ellipta. -Supportive care.  2.  Chronic hypoxic respiratory failure on 2 L nasal cannula/COPD/?PNA -Patient with no significant expiratory wheezing noted on examination however states started to feel some improvement after receiving IV Solu-Medrol and nebulizer treatments in the ED. -Chest x-ray done with concerns of patchy airspace disease at the right lung base atelectasis versus infiltrate. -Due to abnormal chest x-ray urine Legionella antigen, urine pneumococcus antigen ordered and pending.   -Transition from IV Rocephin to Augmentin to complete a 5-day course of treatment.  -Continue scheduled Xopenex and Atrovent nebs, Claritin, Mucinex, prednisone 40 mg daily, Breo Ellipta.  3.  Chronic HFpEF -Euvolemic on examination. -Continue home regimen torsemide.  4.  CAD -Stable. -Continue beta-blocker and nitrates, statin.  5.  Type 2 diabetes mellitus -Hemoglobin A1c 6.4 (12/13/2023) -CBG elevated at 203 this morning likely secondary to IV Solu-Medrol patient received and current oral steroids.. -Continue Semglee to 16 units daily.   -Increase meal coverage NovoLog to 6 units 3 times daily with meals.  -SSI.   6.  Hyperlipidemia -Continue statin.  7.  Paroxysmal atrial fibrillation -Continue Coreg for rate control.  Eliquis for anticoagulation.   8.  History of CVA -Continue Eliquis and Crestor for secondary stroke prophylaxis.   9.  CKD 3B -Serum creatinine noted at 1.45 from 1.60 from 1.87 up from 1.71 September 2024. -Follow.  10.  History of chronic esophageal dysphagia -Patient noted to have been seen by gastroenterology on 01/01/2024 for evaluation of esophageal dysphagia and stated underwent a barium esophagram 01/08/2024 and was to have referral to general surgery to evaluate to see if Lap-Band may have slipped and for potential  Lap-Band deflation pending barium esophagram and was wondering whether he could be seen by general surgery while hospitalized. -Patient seen in  consultation by general surgery Will discuss with general surgery who are following..   DVT prophylaxis: Eliquis Code Status: Full Family Communication: Updated patient.  No family at bedside. Disposition: Likely home when clinically improved.  Status is: Inpatient    Consultants:  General Surgery: Dr. Fredricka Bonine 01/12/2024  Procedures:  Chest x-ray 01/10/2024   Antimicrobials:  Anti-infectives (From admission, onward)    Start     Dose/Rate Route Frequency Ordered Stop   01/13/24 1000  amoxicillin-clavulanate (AUGMENTIN) 875-125 MG per tablet 1 tablet        1 tablet Oral Every 12 hours 01/13/24 0832 01/16/24 0959   01/11/24 2315  oseltamivir (TAMIFLU) capsule 30 mg  Status:  Discontinued        30 mg Oral 2 times daily 01/10/24 2316 01/10/24 2318   01/11/24 1045  cefTRIAXone (ROCEPHIN) 2 g in sodium chloride 0.9 % 100 mL IVPB  Status:  Discontinued        2 g 200 mL/hr over 30 Minutes Intravenous Every 24 hours 01/11/24 1043 01/13/24 0832   01/11/24 1000  oseltamivir (TAMIFLU) capsule 30 mg        30 mg Oral 2 times daily 01/10/24 2318 01/16/24 0959   01/10/24 2315  oseltamivir (TAMIFLU) capsule 75 mg  Status:  Discontinued        75 mg Oral 2 times daily 01/10/24 2310 01/10/24 2311   01/10/24 2315  oseltamivir (TAMIFLU) capsule 75 mg        75 mg Oral  Once 01/10/24 2311 01/11/24 0003   01/10/24 2245  cefTRIAXone (ROCEPHIN) 1 g in sodium chloride 0.9 % 100 mL IVPB  Status:  Discontinued        1 g 200 mL/hr over 30 Minutes Intravenous  Once 01/10/24 2240 01/10/24 2309   01/10/24 2245  azithromycin (ZITHROMAX) 500 mg in sodium chloride 0.9 % 250 mL IVPB  Status:  Discontinued        500 mg 250 mL/hr over 60 Minutes Intravenous  Once 01/10/24 2240 01/10/24 2309         Subjective: Patient sitting up at the side of the bed.  States some improvement with shortness of breath since admission however not as significant as he would like.  Denies any chest pain.  States was seen by  general surgery and thankful for consultation and they cannot take care of the issue with his Lap-Band today.   Objective: Vitals:   01/12/24 2246 01/13/24 0605 01/13/24 0749 01/13/24 0813  BP:  (!) 151/82 (!) 147/76   Pulse:  89 80   Resp:  20 16   Temp:  97.6 F (36.4 C) 97.6 F (36.4 C)   TempSrc:  Oral Oral   SpO2: 100% 94% 99% 98%  Weight:      Height:        Intake/Output Summary (Last 24 hours) at 01/13/2024 1152 Last data filed at 01/12/2024 1237 Gross per 24 hour  Intake 344.3 ml  Output --  Net 344.3 ml   Filed Weights   01/11/24 0804 01/12/24 0633  Weight: 93.9 kg 92.5 kg    Examination:  General exam: NAD. Respiratory system: Diffuse coarse breath sounds.  No wheezing.  No crackles.  Fair air movement.  Cardiovascular system: Regular rate rhythm no murmurs rubs or gallops.  No JVD.  No lower extremity edema.  Gastrointestinal system: Abdomen  is soft, nontender, nondistended, positive bowel sounds.  No rebound.  No guarding.   Central nervous system: Alert and oriented. No focal neurological deficits. Extremities: Symmetric 5 x 5 power. Skin: No rashes, lesions or ulcers Psychiatry: Judgement and insight appear normal. Mood & affect appropriate.     Data Reviewed: I have personally reviewed following labs and imaging studies  CBC: Recent Labs  Lab 01/10/24 2131 01/11/24 0437 01/12/24 0423 01/13/24 0349  WBC 8.9 7.8 12.8* 12.4*  NEUTROABS 7.0  --   --   --   HGB 14.1 13.1 12.7* 12.7*  HCT 43.3 40.2 37.9* 38.8*  MCV 92.5 93.3 90.5 93.5  PLT 203 182 199 200    Basic Metabolic Panel: Recent Labs  Lab 01/10/24 2131 01/11/24 0437 01/12/24 0423 01/13/24 0349  NA 137 134* 138 138  K 3.7 3.7 3.7 3.7  CL 95* 95* 98 97*  CO2 26 25 27 27   GLUCOSE 179* 398* 219* 192*  BUN 50* 47* 50* 50*  CREATININE 1.87* 1.91* 1.60* 1.45*  CALCIUM 9.2 8.5* 8.8* 8.7*    GFR: Estimated Creatinine Clearance: 40.2 mL/min (A) (by C-G formula based on SCr of 1.45  mg/dL (H)).  Liver Function Tests: Recent Labs  Lab 01/10/24 2131  AST 23  ALT 17  ALKPHOS 59  BILITOT 0.9  PROT 6.7  ALBUMIN 3.4*    CBG: Recent Labs  Lab 01/12/24 0812 01/12/24 1238 01/12/24 1600 01/12/24 2025 01/13/24 0804  GLUCAP 383* 262* 186* 279* 203*     Recent Results (from the past 240 hours)  Resp panel by RT-PCR (RSV, Flu A&B, Covid) Anterior Nasal Swab     Status: Abnormal   Collection Time: 01/10/24  9:31 PM   Specimen: Anterior Nasal Swab  Result Value Ref Range Status   SARS Coronavirus 2 by RT PCR NEGATIVE NEGATIVE Final   Influenza A by PCR POSITIVE (A) NEGATIVE Final   Influenza B by PCR NEGATIVE NEGATIVE Final    Comment: (NOTE) The Xpert Xpress SARS-CoV-2/FLU/RSV plus assay is intended as an aid in the diagnosis of influenza from Nasopharyngeal swab specimens and should not be used as a sole basis for treatment. Nasal washings and aspirates are unacceptable for Xpert Xpress SARS-CoV-2/FLU/RSV testing.  Fact Sheet for Patients: BloggerCourse.com  Fact Sheet for Healthcare Providers: SeriousBroker.it  This test is not yet approved or cleared by the [place] FDA and has been authorized for detection and/or diagnosis of SARS-CoV-2 by FDA under an Emergency Use Authorization (EUA). This EUA will remain in effect (meaning this test can be used) for the duration of the COVID-19 declaration under Section 564(b)(1) of the Act, 21 U.S.C. section 360bbb-3(b)(1), unless the authorization is terminated or revoked.     Resp Syncytial Virus by PCR NEGATIVE NEGATIVE Final    Comment: (NOTE) Fact Sheet for Patients: BloggerCourse.com  Fact Sheet for Healthcare Providers: SeriousBroker.it  This test is not yet approved or cleared by the [place] FDA and has been authorized for detection and/or diagnosis of SARS-CoV-2 by FDA under an Emergency  Use Authorization (EUA). This EUA will remain in effect (meaning this test can be used) for the duration of the COVID-19 declaration under Section 564(b)(1) of the Act, 21 U.S.C. section 360bbb-3(b)(1), unless the authorization is terminated or revoked.  Performed at [facility], [address], [place], [place] [address]          Radiology Studies: DG Abd 1 View Result Date: 01/12/2024 CLINICAL DATA:  Lap band  surgery EXAM: ABDOMEN - 1 VIEW COMPARISON:  05/30/2011 FINDINGS: Lap band device is in place, horizontally oriented in the region of the GE junction. Contrast material seen within the left colon. No bowel obstruction, free air or organomegaly. IMPRESSION: Lap band device horizontally oriented in the region of the GE junction. No bowel obstruction or free air. Electronically Signed   By: Charlett Nose M.D.   On: 01/12/2024 18:42        Scheduled Meds:  allopurinol  300 mg Oral Daily   amoxicillin-clavulanate  1 tablet Oral Q12H   apixaban  2.5 mg Oral BID   carvedilol  25 mg Oral BID WC   citalopram  10 mg Oral Daily   feeding supplement  237 mL Oral BID BM   finasteride  5 mg Oral Daily   fluticasone furoate-vilanterol  1 puff Inhalation Daily   guaiFENesin  1,200 mg Oral BID   insulin aspart  0-5 Units Subcutaneous QHS   insulin aspart  0-9 Units Subcutaneous TID WC   insulin aspart  4 Units Subcutaneous TID WC   insulin glargine-yfgn  16 Units Subcutaneous Daily   ipratropium  0.5 mg Nebulization TID   isosorbide mononitrate  60 mg Oral Daily   levalbuterol  0.63 mg Nebulization TID   loratadine  10 mg Oral Daily   oseltamivir  30 mg Oral BID   pantoprazole  40 mg Oral Daily   predniSONE  40 mg Oral QAC breakfast   rosuvastatin  20 mg Oral Daily   torsemide  60 mg Oral Daily   traZODone  100 mg Oral QHS   Continuous Infusions:     LOS: 2 days    Time spent: 40 minutes    Ramiro Harvest, MD [facility]   To contact the attending  provider between 7A-7P or the covering provider during after hours 7P-7A, please log into the web site [email or web address] and access using universal [place] password for that web site. If you do not have the password, please call the hospital operator.  01/13/2024, 11:52 AM

## 2024-01-13 NOTE — Progress Notes (Signed)
Subjective: Doing well.  No new complaints  ROS: See above, otherwise other systems negative  Objective: Vital signs in last 24 hours: Temp:  [97.6 F (36.4 C)-98.1 F (36.7 C)] 97.6 F (36.4 C) (01/20 0749) Pulse Rate:  [80-89] 80 (01/20 0749) Resp:  [16-20] 16 (01/20 0749) BP: (136-166)/(60-82) 147/76 (01/20 0749) SpO2:  [94 %-100 %] 98 % (01/20 0813) Last BM Date : 01/11/24  Intake/Output from previous day: 01/19 0701 - 01/20 0700 In: 584.3 [P.O.:480; IV Piggyback:104.3] Out: -  Intake/Output this shift: No intake/output data recorded.  PE: Abd: chloraprep used to clean the patient's abdominal wall where his port was noted in the RUQ portion of his abd.  A lap band needle was then used to access his port.  8cc of fluid was removed.  Mepitel placed over site (no band-aids on the floor)  Lab Results:  Recent Labs    01/12/24 0423 01/13/24 0349  WBC 12.8* 12.4*  HGB 12.7* 12.7*  HCT 37.9* 38.8*  PLT 199 200   BMET Recent Labs    01/12/24 0423 01/13/24 0349  NA 138 138  K 3.7 3.7  CL 98 97*  CO2 27 27  GLUCOSE 219* 192*  BUN 50* 50*  CREATININE 1.60* 1.45*  CALCIUM 8.8* 8.7*   PT/INR No results for input(s): "LABPROT", "INR" in the last 72 hours. CMP     Component Value Date/Time   NA 138 01/13/2024 0349   NA 137 12/29/2019 0000   K 3.7 01/13/2024 0349   CL 97 (L) 01/13/2024 0349   CO2 27 01/13/2024 0349   GLUCOSE 192 (H) 01/13/2024 0349   BUN 50 (H) 01/13/2024 0349   BUN 32 (H) 08/20/2017 1542   CREATININE 1.45 (H) 01/13/2024 0349   CREATININE 1.35 05/10/2014 1346   CALCIUM 8.7 (L) 01/13/2024 0349   CALCIUM 9.7 02/05/2012 1137   PROT 6.7 01/10/2024 2131   PROT 5.5 (L) 07/31/2017 0921   ALBUMIN 3.4 (L) 01/10/2024 2131   ALBUMIN 3.2 (L) 07/31/2017 0921   AST 23 01/10/2024 2131   ALT 17 01/10/2024 2131   ALKPHOS 59 01/10/2024 2131   BILITOT 0.9 01/10/2024 2131   BILITOT 0.3 07/31/2017 0921   GFRNONAA 48 (L) 01/13/2024 0349    GFRNONAA 52 (L) 05/10/2014 1346   GFRAA 59 (L) 08/20/2017 1542   GFRAA 60 05/10/2014 1346   Lipase     Component Value Date/Time   LIPASE 12 10/22/2011 0020       Studies/Results: DG Abd 1 View Result Date: 01/12/2024 CLINICAL DATA:  Lap band surgery EXAM: ABDOMEN - 1 VIEW COMPARISON:  05/30/2011 FINDINGS: Lap band device is in place, horizontally oriented in the region of the GE junction. Contrast material seen within the left colon. No bowel obstruction, free air or organomegaly. IMPRESSION: Lap band device horizontally oriented in the region of the GE junction. No bowel obstruction or free air. Electronically Signed   By: Charlett Nose M.D.   On: 01/12/2024 18:42    Anti-infectives: Anti-infectives (From admission, onward)    Start     Dose/Rate Route Frequency Ordered Stop   01/13/24 1000  amoxicillin-clavulanate (AUGMENTIN) 875-125 MG per tablet 1 tablet        1 tablet Oral Every 12 hours 01/13/24 0832 01/16/24 0959   01/11/24 2315  oseltamivir (TAMIFLU) capsule 30 mg  Status:  Discontinued        30 mg Oral 2 times daily 01/10/24 2316 01/10/24 2318  01/11/24 1045  cefTRIAXone (ROCEPHIN) 2 g in sodium chloride 0.9 % 100 mL IVPB  Status:  Discontinued        2 g 200 mL/hr over 30 Minutes Intravenous Every 24 hours 01/11/24 1043 01/13/24 0832   01/11/24 1000  oseltamivir (TAMIFLU) capsule 30 mg        30 mg Oral 2 times daily 01/10/24 2318 01/16/24 0959   01/10/24 2315  oseltamivir (TAMIFLU) capsule 75 mg  Status:  Discontinued        75 mg Oral 2 times daily 01/10/24 2310 01/10/24 2311   01/10/24 2315  oseltamivir (TAMIFLU) capsule 75 mg        75 mg Oral  Once 01/10/24 2311 01/11/24 0003   01/10/24 2245  cefTRIAXone (ROCEPHIN) 1 g in sodium chloride 0.9 % 100 mL IVPB  Status:  Discontinued        1 g 200 mL/hr over 30 Minutes Intravenous  Once 01/10/24 2240 01/10/24 2309   01/10/24 2245  azithromycin (ZITHROMAX) 500 mg in sodium chloride 0.9 % 250 mL IVPB  Status:   Discontinued        500 mg 250 mL/hr over 60 Minutes Intravenous  Once 01/10/24 2240 01/10/24 2309        Assessment/Plan Dysphagia, s/p lap band placement years ago -8cc of fluid removed from his band.  This should hopefully help with his dysphagia -there is evidence this has slipped though.  This is not urgent, but if patient continues to have issues moving forward, he should follow up with a bariatric surgeon in our office to consider removal. -no further acute surgical needs.  We are available as needed.   I reviewed hospitalist notes, last 24 h vitals and pain scores, last 48 h intake and output, last 24 h labs and trends, and last 24 h imaging results.   LOS: 2 days    Letha Cape , [facility] Surgery 01/13/2024, 2:39 PM Please see Amion for pager number during day hours 7:00am-4:30pm or 7:00am -11:30am on weekends

## 2024-01-13 NOTE — Plan of Care (Signed)

## 2024-01-14 DIAGNOSIS — I480 Paroxysmal atrial fibrillation: Secondary | ICD-10-CM | POA: Diagnosis not present

## 2024-01-14 DIAGNOSIS — J4489 Other specified chronic obstructive pulmonary disease: Secondary | ICD-10-CM | POA: Diagnosis not present

## 2024-01-14 DIAGNOSIS — I5032 Chronic diastolic (congestive) heart failure: Secondary | ICD-10-CM | POA: Diagnosis not present

## 2024-01-14 DIAGNOSIS — J101 Influenza due to other identified influenza virus with other respiratory manifestations: Secondary | ICD-10-CM | POA: Diagnosis not present

## 2024-01-14 LAB — BASIC METABOLIC PANEL
Anion gap: 13 (ref 5–15)
BUN: 48 mg/dL — ABNORMAL HIGH (ref 8–23)
CO2: 26 mmol/L (ref 22–32)
Calcium: 8.7 mg/dL — ABNORMAL LOW (ref 8.9–10.3)
Chloride: 99 mmol/L (ref 98–111)
Creatinine, Ser: 1.64 mg/dL — ABNORMAL HIGH (ref 0.61–1.24)
GFR, Estimated: 42 mL/min — ABNORMAL LOW (ref >60)
Glucose, Bld: 114 mg/dL — ABNORMAL HIGH (ref 70–99)
Potassium: 3.9 mmol/L (ref 3.5–5.1)
Sodium: 138 mmol/L (ref 135–145)

## 2024-01-14 LAB — CBC
HCT: 41.9 % (ref 39.0–52.0)
Hemoglobin: 13.8 g/dL (ref 13.0–17.0)
MCH: 30.5 pg (ref 26.0–34.0)
MCHC: 32.9 g/dL (ref 30.0–36.0)
MCV: 92.5 fL (ref 80.0–100.0)
Platelets: 224 10*3/uL (ref 150–400)
RBC: 4.53 MIL/uL (ref 4.22–5.81)
RDW: 13.3 % (ref 11.5–15.5)
WBC: 13.8 10*3/uL — ABNORMAL HIGH (ref 4.0–10.5)
nRBC: 0 % (ref 0.0–0.2)

## 2024-01-14 LAB — GLUCOSE, CAPILLARY
Glucose-Capillary: 160 mg/dL — ABNORMAL HIGH (ref 70–99)
Glucose-Capillary: 512 mg/dL (ref 70–99)
Glucose-Capillary: 237 mg/dL — ABNORMAL HIGH (ref 70–99)
Glucose-Capillary: 242 mg/dL — ABNORMAL HIGH (ref 70–99)
Glucose-Capillary: 348 mg/dL — ABNORMAL HIGH (ref 70–99)

## 2024-01-14 MED ORDER — INSULIN ASPART 100 UNIT/ML IJ SOLN
14.0000 [IU] | Freq: Once | INTRAMUSCULAR | Status: AC
  Administered 2024-01-14: 14 [IU] via SUBCUTANEOUS

## 2024-01-14 MED ORDER — OXYMETAZOLINE HCL 0.05 % NA SOLN
1.0000 | Freq: Two times a day (BID) | NASAL | Status: DC
  Administered 2024-01-14 – 2024-01-15 (×3): 1 via NASAL
  Filled 2024-01-14: qty 30

## 2024-01-14 NOTE — Progress Notes (Signed)
PROGRESS NOTE    [name].  FTD:[id number] DOB: [birth year] DOA: 01/10/2024 PCP: Loyola Mast, MD   Chief Complaint  Patient presents with   Shortness of Breath    Brief Narrative:  Patient is a 83 year old gentleman history of COPD, chronic respiratory failure on 2 L nasal cannula, diabetes mellitus, CAD, paroxysmal A-fib on Eliquis, history of CVA with left-sided weakness, chronic HFpEF presenting with cough and worsening shortness of breath, rhinorrhea and loose stools for a few days.  Patient noted to have worsening symptoms with dyspnea even at rest and subsequently presented to the ED.  Patient said not much relief from breathing treatments.  Patient seen in the ED chest x-ray done notable for trace right pleural effusion and patchy airspace disease could reflect atelectasis versus infiltrate.  Influenza A PCR noted to be positive.  Patient placed on Tamiflu, received a dose of IV Solu-Medrol and DuoNebs in the ED.   Assessment & Plan:   Principal Problem:   Influenza A Active Problems:   Essential hypertension   Coronary artery disease   Chronic diastolic heart failure (HCC)   Chronic respiratory failure with hypoxia and hypercapnia (HCC)   COPD with chronic bronchitis (HCC)   Chronic kidney disease, stage 3b (HCC)   History of stroke   PAF (paroxysmal atrial fibrillation) (HCC)   Type 2 diabetes mellitus with stage 3b chronic kidney disease, with long-term current use of insulin (HCC)  #1 influenza A -Patient with chronic underlying lung and heart disease, presenting with shortness of breath which had occurred at rest now with cough, rhinorrhea ongoing for the past few days. -Chest x-ray done with concerns for patchy airspace disease at the right lung base, possible atelectasis or infiltrate, suspected trace right pleural effusion. -Influenza A PCR positive. -Continue Tamiflu. -Continue scheduled Xopenex and Atrovent nebs, Claritin, Mucinex, Breo Ellipta. -Afrin  twice daily x 3 days. -Supportive care.  2.  Chronic hypoxic respiratory failure on 2 L nasal cannula/COPD/?PNA -Patient with no significant expiratory wheezing noted on examination however states started to feel some improvement after receiving IV Solu-Medrol and nebulizer treatments in the ED. -Chest x-ray done with concerns of patchy airspace disease at the right lung base atelectasis versus infiltrate. -Due to abnormal chest x-ray urine Legionella antigen, urine pneumococcus antigen ordered and pending.   -Transitioned from IV Rocephin to Augmentin to complete a 5-day course of treatment.  -Continue scheduled Xopenex and Atrovent nebs, Claritin, Mucinex, Breo Ellipta. -Last dose of prednisone today.  3.  Chronic HFpEF -Euvolemic on examination. -Continue home regimen torsemide, Imdur, Coreg.  4.  CAD -Stable. -Continue beta-blocker and nitrates, statin.  5.  Type 2 diabetes mellitus -Hemoglobin A1c 6.4 (12/13/2023) -CBG elevated at 160 this morning likely secondary to IV Solu-Medrol patient received and current oral steroids. -Last dose of steroids today. -Continue Semglee 16 units daily. -Continue NovoLog meal coverage 6 units 3 times daily.   -SSI.   6.  Hyperlipidemia -Statin.   7.  Paroxysmal atrial fibrillation -Coreg for rate control. -Eliquis for anticoagulation.  8.  History of CVA -Continue Crestor and Eliquis for secondary stroke prophylaxis.   9.  CKD 3B -Serum creatinine noted at 1.64 from 1.45 from 1.60 from 1.87 up from 1.71 September 2024. -Follow.  10.  History of chronic esophageal dysphagia -Patient noted to have been seen by gastroenterology on 01/01/2024 for evaluation of esophageal dysphagia and stated underwent a barium esophagram 01/08/2024 and was to have referral to general surgery to evaluate to see  if Lap-Band may have slipped and for potential Lap-Band deflation pending barium esophagram and was wondering whether he could be seen by general  surgery while hospitalized. -Patient seen in consultation by general surgery and fluid removed from his Lap-Band. -Outpatient follow-up with general surgery.   DVT prophylaxis: Eliquis Code Status: Full Family Communication: Updated patient.  No family at bedside. Disposition: Likely home when clinically improved hopefully in the next 24 hours.  Status is: Inpatient    Consultants:  General Surgery: Dr. Fredricka Bonine 01/12/2024  Procedures:  Chest x-ray 01/10/2024   Antimicrobials:  Anti-infectives (From admission, onward)    Start     Dose/Rate Route Frequency Ordered Stop   01/13/24 1000  amoxicillin-clavulanate (AUGMENTIN) 875-125 MG per tablet 1 tablet        1 tablet Oral Every 12 hours 01/13/24 0832 01/16/24 0959   01/11/24 2315  oseltamivir (TAMIFLU) capsule 30 mg  Status:  Discontinued        30 mg Oral 2 times daily 01/10/24 2316 01/10/24 2318   01/11/24 1045  cefTRIAXone (ROCEPHIN) 2 g in sodium chloride 0.9 % 100 mL IVPB  Status:  Discontinued        2 g 200 mL/hr over 30 Minutes Intravenous Every 24 hours 01/11/24 1043 01/13/24 0832   01/11/24 1000  oseltamivir (TAMIFLU) capsule 30 mg        30 mg Oral 2 times daily 01/10/24 2318 01/16/24 0959   01/10/24 2315  oseltamivir (TAMIFLU) capsule 75 mg  Status:  Discontinued        75 mg Oral 2 times daily 01/10/24 2310 01/10/24 2311   01/10/24 2315  oseltamivir (TAMIFLU) capsule 75 mg        75 mg Oral  Once 01/10/24 2311 01/11/24 0003   01/10/24 2245  cefTRIAXone (ROCEPHIN) 1 g in sodium chloride 0.9 % 100 mL IVPB  Status:  Discontinued        1 g 200 mL/hr over 30 Minutes Intravenous  Once 01/10/24 2240 01/10/24 2309   01/10/24 2245  azithromycin (ZITHROMAX) 500 mg in sodium chloride 0.9 % 250 mL IVPB  Status:  Discontinued        500 mg 250 mL/hr over 60 Minutes Intravenous  Once 01/10/24 2240 01/10/24 2309         Subjective: Patient sitting up in chair.  States he feels very congested he feels short of breath and  feels he cannot lay back or lay flat due to feelings of shortness of breath.  States he does not feel he is ready to be discharged today.  Complains of generalized weakness and myalgias.    Objective: Vitals:   01/13/24 2330 01/14/24 0100 01/14/24 0640 01/14/24 0750  BP: (!) 163/69 (!) 156/75 (!) 161/68 (!) 149/66  Pulse: 80 88 82 81  Resp: 17  17 18   Temp: 97.9 F (36.6 C)  97.6 F (36.4 C) 97.8 F (36.6 C)  TempSrc: Oral  Oral Oral  SpO2: 99% 100% 97% 99%  Weight:      Height:       No intake or output data in the 24 hours ending 01/14/24 1015  Filed Weights   01/11/24 0804 01/12/24 0633  Weight: 93.9 kg 92.5 kg    Examination:  General exam: NAD. Respiratory system: Improved diffuse coarse breath sounds.  No wheezing.  No crackles.  Fair air movement. Cardiovascular system: RRR no murmurs rubs or gallops.  No JVD.  No lower pitting extremity edema.  Gastrointestinal system: Abdomen  is soft, nontender, nondistended, positive bowel sounds.  No rebound.  No guarding.  Central nervous system: Alert and oriented. No focal neurological deficits. Extremities: Symmetric 5 x 5 power. Skin: No rashes, lesions or ulcers Psychiatry: Judgement and insight appear normal. Mood & affect appropriate.     Data Reviewed: I have personally reviewed following labs and imaging studies  CBC: Recent Labs  Lab 01/10/24 2131 01/11/24 0437 01/12/24 0423 01/13/24 0349 01/14/24 0249  WBC 8.9 7.8 12.8* 12.4* 13.8*  NEUTROABS 7.0  --   --   --   --   HGB 14.1 13.1 12.7* 12.7* 13.8  HCT 43.3 40.2 37.9* 38.8* 41.9  MCV 92.5 93.3 90.5 93.5 92.5  PLT 203 182 199 200 224    Basic Metabolic Panel: Recent Labs  Lab 01/10/24 2131 01/11/24 0437 01/12/24 0423 01/13/24 0349 01/14/24 0249  NA 137 134* 138 138 138  K 3.7 3.7 3.7 3.7 3.9  CL 95* 95* 98 97* 99  CO2 26 25 27 27 26   GLUCOSE 179* 398* 219* 192* 114*  BUN 50* 47* 50* 50* 48*  CREATININE 1.87* 1.91* 1.60* 1.45* 1.64*  CALCIUM  9.2 8.5* 8.8* 8.7* 8.7*    GFR: Estimated Creatinine Clearance: 35.5 mL/min (A) (by C-G formula based on SCr of 1.64 mg/dL (H)).  Liver Function Tests: Recent Labs  Lab 01/10/24 2131  AST 23  ALT 17  ALKPHOS 59  BILITOT 0.9  PROT 6.7  ALBUMIN 3.4*    CBG: Recent Labs  Lab 01/13/24 1206 01/13/24 1526 01/13/24 2037 01/13/24 2337 01/14/24 0751  GLUCAP 342* 344* 320* 145* 160*     Recent Results (from the past 240 hours)  Resp panel by RT-PCR (RSV, Flu A&B, Covid) Anterior Nasal Swab     Status: Abnormal   Collection Time: 01/10/24  9:31 PM   Specimen: Anterior Nasal Swab  Result Value Ref Range Status   SARS Coronavirus 2 by RT PCR NEGATIVE NEGATIVE Final   Influenza A by PCR POSITIVE (A) NEGATIVE Final   Influenza B by PCR NEGATIVE NEGATIVE Final    Comment: (NOTE) The Xpert Xpress SARS-CoV-2/FLU/RSV plus assay is intended as an aid in the diagnosis of influenza from Nasopharyngeal swab specimens and should not be used as a sole basis for treatment. Nasal washings and aspirates are unacceptable for Xpert Xpress SARS-CoV-2/FLU/RSV testing.  Fact Sheet for Patients: BloggerCourse.com  Fact Sheet for Healthcare Providers: SeriousBroker.it  This test is not yet approved or cleared by the [place] FDA and has been authorized for detection and/or diagnosis of SARS-CoV-2 by FDA under an Emergency Use Authorization (EUA). This EUA will remain in effect (meaning this test can be used) for the duration of the COVID-19 declaration under Section 564(b)(1) of the Act, 21 U.S.C. section 360bbb-3(b)(1), unless the authorization is terminated or revoked.     Resp Syncytial Virus by PCR NEGATIVE NEGATIVE Final    Comment: (NOTE) Fact Sheet for Patients: BloggerCourse.com  Fact Sheet for Healthcare Providers: SeriousBroker.it  This test is not yet approved or cleared  by the [place] FDA and has been authorized for detection and/or diagnosis of SARS-CoV-2 by FDA under an Emergency Use Authorization (EUA). This EUA will remain in effect (meaning this test can be used) for the duration of the COVID-19 declaration under Section 564(b)(1) of the Act, 21 U.S.C. section 360bbb-3(b)(1), unless the authorization is terminated or revoked.  Performed at [facility], [address] [place], [place] [address]  Radiology Studies: DG Abd 1 View Result Date: 01/12/2024 CLINICAL DATA:  Lap band surgery EXAM: ABDOMEN - 1 VIEW COMPARISON:  05/30/2011 FINDINGS: Lap band device is in place, horizontally oriented in the region of the GE junction. Contrast material seen within the left colon. No bowel obstruction, free air or organomegaly. IMPRESSION: Lap band device horizontally oriented in the region of the GE junction. No bowel obstruction or free air. Electronically Signed   By: Charlett Nose M.D.   On: 01/12/2024 18:42        Scheduled Meds:  allopurinol  300 mg Oral Daily   amoxicillin-clavulanate  1 tablet Oral Q12H   apixaban  2.5 mg Oral BID   carvedilol  25 mg Oral BID WC   citalopram  10 mg Oral Daily   feeding supplement  237 mL Oral BID BM   finasteride  5 mg Oral Daily   fluticasone furoate-vilanterol  1 puff Inhalation Daily   guaiFENesin  1,200 mg Oral BID   insulin aspart  0-5 Units Subcutaneous QHS   insulin aspart  0-9 Units Subcutaneous TID WC   insulin aspart  6 Units Subcutaneous TID WC   insulin glargine-yfgn  16 Units Subcutaneous Daily   isosorbide mononitrate  60 mg Oral Daily   loratadine  10 mg Oral Daily   oseltamivir  30 mg Oral BID   pantoprazole  40 mg Oral Daily   rosuvastatin  20 mg Oral Daily   torsemide  60 mg Oral Daily   traZODone  100 mg Oral QHS   Continuous Infusions:     LOS: 3 days    Time spent: 40 minutes    Ramiro Harvest, MD [facility]   To contact the attending  provider between 7A-7P or the covering provider during after hours 7P-7A, please log into the web site [email or web address] and access using universal [place] password for that web site. If you do not have the password, please call the hospital operator.  01/14/2024, 10:15 AM

## 2024-01-14 NOTE — Evaluation (Signed)
Physical Therapy Re-Evaluation Patient Details Name: [name]. MRN: [id number] DOB: 23-Jul-1941 Today's Date: 01/14/2024  History of Present Illness  Pt is an 83 y/o M admitted on 01/10/24 after presenting with c/o cough & worsening SOB, rhinorrhea & loose stools x a few days. Chest x-ray done notable for trace right pleural effusion and patchy airspace disease could reflect atelectasis versus infiltrate. Pt tested + for Influenza A. PMH: COPD, chronic respiratory failure on 2L O2, DM, CAD, paroxysmal a-fib on Eliquis, CVA with L sided weakness, chronic HFpEF  Clinical Impression  Pt seen for PT re-evaluation per pt & MD request as pt with c/o weakness on this date. Pt reports prior to admission he was independent without AD. On this date, pt notes significant weakness. Pt attempts to ambulate without AD but furniture walking - PT educated pt on need for AD vs furniture walking & pt agreeable/asking for RW. Pt ambulated x 2 with RW & supervision fade to mod I with decreased gait speed & rest break between each trial 2/2 fatigue.  Pt would benefit from ongoing acute PT services to address strengthening, balance, endurance, & gait with LRAD.     If plan is discharge home, recommend the following: A little help with walking and/or transfers;A little help with bathing/dressing/bathroom;Assistance with cooking/housework;Assist for transportation   Can travel by private vehicle        Equipment Recommendations None recommended by PT  Recommendations for Other Services       Functional Status Assessment Patient has had a recent decline in their functional status and demonstrates the ability to make significant improvements in function in a reasonable and predictable amount of time.     Precautions / Restrictions Precautions Precautions: Fall Restrictions Weight Bearing Restrictions Per Provider Order: No      Mobility  Bed Mobility               General bed mobility comments:  not tested, pt received & left sitting in recliner    Transfers Overall transfer level: Independent                 General transfer comment: STS without AD, with RW, cuing re: hand placement to push to standing when transferring STS with RW    Ambulation/Gait Ambulation/Gait assistance: Modified independent (Device/Increase time), Supervision Gait Distance (Feet): 90 Feet (+ 90 ft) Assistive device: Rolling walker (2 wheels) Gait Pattern/deviations: Decreased step length - right, Decreased step length - left, Decreased stride length Gait velocity: decreased     General Gait Details: Pt ambulates in hallway x 2 with seated rest break in between with RW & supervision fade to mod I. Prior to gait in hallway pt attempting to ambulate around room without AD but holding to furniture with PT educating him on increased safety with AD so pt agreeable to ambulate & requesting to ambulate with RW.  Stairs            Wheelchair Mobility     Tilt Bed    Modified Rankin (Stroke Patients Only)       Balance Overall balance assessment: Needs assistance Sitting-balance support: No upper extremity supported, Feet supported Sitting balance-Leahy Scale: Normal     Standing balance support: During functional activity, No upper extremity supported Standing balance-Leahy Scale: Fair                               Pertinent Vitals/Pain Pain Assessment Pain  Assessment: Faces Faces Pain Scale: Hurts a little bit Pain Location: chronic L shoulder pain Pain Descriptors / Indicators: Discomfort Pain Intervention(s): Monitored during session    Home Living Family/patient expects to be discharged to:: Private residence Living Arrangements: Spouse/significant other Available Help at Discharge: Family;Available 24 hours/day Type of Home: House Home Access: Ramped entrance       Home Layout: One level Home Equipment: Agricultural consultant (2 wheels);Transport chair;Shower  seat      Prior Function Prior Level of Function : Independent/Modified Independent             Mobility Comments: independent in home, limited community distances with RW, transport chair for longer distances ADLs Comments: sits to shower, he and his wife take care of their bed bound adult daughter with MS     Extremity/Trunk Assessment   Upper Extremity Assessment Upper Extremity Assessment: Overall WFL for tasks assessed    Lower Extremity Assessment Lower Extremity Assessment: Generalized weakness;Overall WFL for tasks assessed       Communication   Communication Communication: No apparent difficulties  Cognition Arousal: Alert Behavior During Therapy: WFL for tasks assessed/performed Overall Cognitive Status: Within Functional Limits for tasks assessed                                          General Comments General comments (skin integrity, edema, etc.): pt on 2L/min via nasal cannula, SpO2 >90% throughout session    Exercises     Assessment/Plan    PT Assessment Patient needs continued PT services  PT Problem List Decreased strength;Cardiopulmonary status limiting activity;Decreased activity tolerance;Decreased balance;Decreased mobility;Decreased knowledge of use of DME       PT Treatment Interventions DME instruction;Balance training;Gait training;Neuromuscular re-education;Functional mobility training;Stair training;Therapeutic activities;Therapeutic exercise;Manual techniques;Patient/family education    PT Goals (Current goals can be found in the Care Plan section)  Acute Rehab PT Goals Patient Stated Goal: get stronger, get better PT Goal Formulation: With patient Time For Goal Achievement: 01/28/24 Potential to Achieve Goals: Good    Frequency Min 1X/week     Co-evaluation               AM-PAC PT "6 Clicks" Mobility  Outcome Measure Help needed turning from your back to your side while in a flat bed without using  bedrails?: None Help needed moving from lying on your back to sitting on the side of a flat bed without using bedrails?: None Help needed moving to and from a bed to a chair (including a wheelchair)?: A Little Help needed standing up from a chair using your arms (e.g., wheelchair or bedside chair)?: None Help needed to walk in hospital room?: A Little Help needed climbing 3-5 steps with a railing? : A Little 6 Click Score: 21    End of Session   Activity Tolerance: Patient tolerated treatment well;Patient limited by fatigue Patient left: in chair;with call bell/phone within reach   PT Visit Diagnosis: Muscle weakness (generalized) (M62.81);Unsteadiness on feet (R26.81)    Time: 6295-2841 PT Time Calculation (min) (ACUTE ONLY): 16 min   Charges:   PT Evaluation $PT Re-evaluation: 1 Re-eval   PT General Charges $$ ACUTE PT VISIT: 1 Visit         Aleda Grana, PT, DPT 01/14/24, 1:49 PM   Sandi Mariscal 01/14/2024, 1:47 PM

## 2024-01-14 NOTE — Progress Notes (Signed)
Patient voiced concerns about his blood sugars. States that he normally manages sugars at home w/ 9 units as his base and a sliding scale dependent on his sugars. He also states that he missed his dose of Trulicity yesterday. Will relay this information to day RN.

## 2024-01-14 NOTE — Progress Notes (Signed)
Mobility Specialist Progress Note:   01/14/24 1240  Mobility  Activity Ambulated with assistance in hallway  Level of Assistance  (MinG)  Assistive Device None  Distance Ambulated (ft) 60 ft  Activity Response Tolerated well  Mobility Referral Yes  Mobility visit 1 Mobility  Mobility Specialist Start Time (ACUTE ONLY) 1147  Mobility Specialist Stop Time (ACUTE ONLY) 1200  Mobility Specialist Time Calculation (min) (ACUTE ONLY) 13 min   During Mobility: 98 HR , 96% SpO2 2 L   Pt received in chair, agreeable to mobility. No hands on assist needed during ambulation. Ambulated on 2 L. Pt c/o SOB and general weakness during session. Pursed lip breathing encouraged. VSS throughout. Pt returned to chair asymptomatic with call bell in reach and all needs met.   [name]  Mobility Specialist Please contact via [employer] office at [phone]

## 2024-01-14 NOTE — Plan of Care (Signed)
Patient alert/oriented X4 and compliant with medication administration. Patient had a hyperglycemic event of 512, MD notified and 14 units of insulin administered. New blood sugar level is 237. Patient was up in chair the majority of shift and remains on 2L nasal cannula throughout the day, will continue to wean. Albuterol treatments administered as needed for shortness of breath. VSS, no complaints at this time.   Problem: Education: Goal: Ability to describe self-care measures that may prevent or decrease complications (Diabetes Survival Skills Education) will improve Outcome: Progressing   Problem: Education: Goal: Individualized Educational Video(s) Outcome: Progressing   Problem: Coping: Goal: Ability to adjust to condition or change in health will improve Outcome: Progressing   Problem: Fluid Volume: Goal: Ability to maintain a balanced intake and output will improve Outcome: Progressing   Problem: Health Behavior/Discharge Planning: Goal: Ability to identify and utilize available resources and services will improve Outcome: Progressing   Problem: Health Behavior/Discharge Planning: Goal: Ability to manage health-related needs will improve Outcome: Progressing   Problem: Metabolic: Goal: Ability to maintain appropriate glucose levels will improve Outcome: Progressing   Problem: Nutritional: Goal: Maintenance of adequate nutrition will improve Outcome: Progressing   Problem: Nutritional: Goal: Progress toward achieving an optimal weight will improve Outcome: Progressing   Problem: Skin Integrity: Goal: Risk for impaired skin integrity will decrease Outcome: Progressing   Problem: Tissue Perfusion: Goal: Adequacy of tissue perfusion will improve Outcome: Progressing   Problem: Education: Goal: Knowledge of General Education information will improve Description: Including pain rating scale, medication(s)/side effects and non-pharmacologic comfort measures Outcome:  Progressing   Problem: Health Behavior/Discharge Planning: Goal: Ability to manage health-related needs will improve Outcome: Progressing   Problem: Clinical Measurements: Goal: Ability to maintain clinical measurements within normal limits will improve Outcome: Progressing   Problem: Clinical Measurements: Goal: Will remain free from infection Outcome: Progressing   Problem: Clinical Measurements: Goal: Diagnostic test results will improve Outcome: Progressing   Problem: Clinical Measurements: Goal: Respiratory complications will improve Outcome: Progressing   Problem: Clinical Measurements: Goal: Cardiovascular complication will be avoided Outcome: Progressing   Problem: Activity: Goal: Risk for activity intolerance will decrease Outcome: Progressing   Problem: Coping: Goal: Level of anxiety will decrease Outcome: Progressing   Problem: Elimination: Goal: Will not experience complications related to bowel motility Outcome: Progressing   Problem: Pain Managment: Goal: General experience of comfort will improve and/or be controlled Outcome: Progressing   Problem: Skin Integrity: Goal: Risk for impaired skin integrity will decrease Outcome: Progressing   Problem: Activity: Goal: Ability to tolerate increased activity will improve Outcome: Progressing   Problem: Clinical Measurements: Goal: Ability to maintain a body temperature in the normal range will improve Outcome: Progressing   Problem: Respiratory: Goal: Ability to maintain adequate ventilation will improve Outcome: Progressing   Problem: Respiratory: Goal: Ability to maintain a clear airway will improve Outcome: Progressing   Problem: Nutrition: Goal: Adequate nutrition will be maintained Outcome: Completed/Met   Problem: Elimination: Goal: Will not experience complications related to urinary retention Outcome: Completed/Met

## 2024-01-15 DIAGNOSIS — J101 Influenza due to other identified influenza virus with other respiratory manifestations: Secondary | ICD-10-CM | POA: Diagnosis not present

## 2024-01-15 LAB — CBC
HCT: 40.2 % (ref 39.0–52.0)
Hemoglobin: 13.1 g/dL (ref 13.0–17.0)
MCH: 29.6 pg (ref 26.0–34.0)
MCHC: 32.6 g/dL (ref 30.0–36.0)
MCV: 91 fL (ref 80.0–100.0)
Platelets: 234 10*3/uL (ref 150–400)
RBC: 4.42 MIL/uL (ref 4.22–5.81)
RDW: 13.2 % (ref 11.5–15.5)
WBC: 8.6 10*3/uL (ref 4.0–10.5)
nRBC: 0 % (ref 0.0–0.2)

## 2024-01-15 LAB — BASIC METABOLIC PANEL
Anion gap: 9 (ref 5–15)
BUN: 51 mg/dL — ABNORMAL HIGH (ref 8–23)
CO2: 29 mmol/L (ref 22–32)
Calcium: 8.7 mg/dL — ABNORMAL LOW (ref 8.9–10.3)
Chloride: 94 mmol/L — ABNORMAL LOW (ref 98–111)
Creatinine, Ser: 1.46 mg/dL — ABNORMAL HIGH (ref 0.61–1.24)
GFR, Estimated: 48 mL/min — ABNORMAL LOW (ref >60)
Glucose, Bld: 227 mg/dL — ABNORMAL HIGH (ref 70–99)
Potassium: 3.8 mmol/L (ref 3.5–5.1)
Sodium: 132 mmol/L — ABNORMAL LOW (ref 135–145)

## 2024-01-15 LAB — GLUCOSE, CAPILLARY
Glucose-Capillary: 191 mg/dL — ABNORMAL HIGH (ref 70–99)
Glucose-Capillary: 415 mg/dL — ABNORMAL HIGH (ref 70–99)

## 2024-01-15 MED ORDER — INSULIN ASPART 100 UNIT/ML IJ SOLN
12.0000 [IU] | Freq: Once | INTRAMUSCULAR | Status: AC
  Administered 2024-01-15: 12 [IU] via SUBCUTANEOUS

## 2024-01-15 MED ORDER — GUAIFENESIN ER 600 MG PO TB12: 1200.0000 mg | ORAL_TABLET | Freq: Two times a day (BID) | ORAL | 0 refills | Status: AC

## 2024-01-15 NOTE — Progress Notes (Signed)
See discharge delay due to DME in navigator.  Also, patient is unable to wait in discharge lounge due to droplet precautions influenza A.

## 2024-01-15 NOTE — Progress Notes (Signed)
OT Cancellation Note  Patient Details Name: [name]. MRN: [id number] DOB: January 08, 1941   Cancelled Treatment:    Reason Eval/Treat Not Completed: Other (comment) OT re-consulted due to weakness. Pt discharged and waiting for DME order to arrive. Plan is to follow up with HHPT services. Recommend holding acute OT eval and have HHOT ordered to evaluate any follow up needs/recommendations. Thank you for the referral.   Limmie Patricia, OTR/L,CBIS  Supplemental OT - [facility] and [facility] Secure Chat Preferred   01/15/2024, 3:06 PM

## 2024-01-15 NOTE — Plan of Care (Signed)
  Problem: Education: Goal: Ability to describe self-care measures that may prevent or decrease complications (Diabetes Survival Skills Education) will improve Outcome: Progressing Goal: Individualized Educational Video(s) Outcome: Progressing   Problem: Coping: Goal: Ability to adjust to condition or change in health will improve Outcome: Progressing   Problem: Fluid Volume: Goal: Ability to maintain a balanced intake and output will improve Outcome: Progressing   Problem: Health Behavior/Discharge Planning: Goal: Ability to identify and utilize available resources and services will improve Outcome: Progressing Goal: Ability to manage health-related needs will improve Outcome: Progressing   Problem: Metabolic: Goal: Ability to maintain appropriate glucose levels will improve Outcome: Progressing   Problem: Nutritional: Goal: Maintenance of adequate nutrition will improve Outcome: Progressing Goal: Progress toward achieving an optimal weight will improve Outcome: Progressing   Problem: Skin Integrity: Goal: Risk for impaired skin integrity will decrease Outcome: Progressing   Problem: Tissue Perfusion: Goal: Adequacy of tissue perfusion will improve Outcome: Progressing   Problem: Education: Goal: Knowledge of General Education information will improve Description: Including pain rating scale, medication(s)/side effects and non-pharmacologic comfort measures Outcome: Progressing   Problem: Health Behavior/Discharge Planning: Goal: Ability to manage health-related needs will improve Outcome: Progressing   Problem: Clinical Measurements: Goal: Ability to maintain clinical measurements within normal limits will improve Outcome: Progressing Goal: Will remain free from infection Outcome: Progressing Goal: Diagnostic test results will improve Outcome: Progressing Goal: Respiratory complications will improve Outcome: Progressing Goal: Cardiovascular complication will  be avoided Outcome: Progressing   Problem: Activity: Goal: Risk for activity intolerance will decrease Outcome: Progressing   Problem: Coping: Goal: Level of anxiety will decrease Outcome: Progressing   Problem: Elimination: Goal: Will not experience complications related to bowel motility Outcome: Progressing   Problem: Pain Managment: Goal: General experience of comfort will improve and/or be controlled Outcome: Progressing   Problem: Safety: Goal: Ability to remain free from injury will improve Outcome: Progressing   Problem: Skin Integrity: Goal: Risk for impaired skin integrity will decrease Outcome: Progressing   Problem: Activity: Goal: Ability to tolerate increased activity will improve Outcome: Progressing   Problem: Clinical Measurements: Goal: Ability to maintain a body temperature in the normal range will improve Outcome: Progressing   Problem: Respiratory: Goal: Ability to maintain adequate ventilation will improve Outcome: Progressing Goal: Ability to maintain a clear airway will improve Outcome: Progressing

## 2024-01-15 NOTE — TOC Transition Note (Signed)
Transition of Care [facility]) - Discharge Note   Patient Details  Name: [name]. MRN: [id number] Date of Birth: 11/01/1941  Transition of Care [facility]) CM/SW Contact:  Harriet Masson, RN Phone Number: 01/15/2024, 1:16 PM   Clinical Narrative:    Patient stable for discharge. Orders for home health and DME oxygen.  This RNCM offered choice for Home  Health, Patient states He has no preference, RNCM made referral to [facility] with Frances Furbish, He is able to take referral.  Patient has home 02 @ night. New order for continuous 02. Mitch notified. Awaiting on walk test.  Patient has transportation.  Address, Phone number and PCP verified.  Final next level of care: Home w Home Health Services Barriers to Discharge: Barriers Resolved   Patient Goals and CMS Choice Patient states their goals for this hospitalization and ongoing recovery are:: return home CMS Medicare.gov Compare Post Acute Care list provided to:: Patient Choice offered to / list presented to : Patient      Discharge Placement               home        Discharge Plan and Services Additional resources added to the After Visit Summary for                  DME Arranged: Oxygen DME Agency: [employer] Date DME Agency Contacted: 01/15/24 Time DME Agency Contacted: 1315 Representative spoke with at DME Agency: Marthann Schiller HH Arranged: PT HH Agency: [facility] Date [facility] Agency Contacted: 01/15/24 Time HH Agency Contacted: 1315 Representative spoke with at [facility] Agency: Kandee Keen  Social Drivers of Health (SDOH) Interventions SDOH Screenings   Food Insecurity: No Food Insecurity (01/11/2024)  Housing: Low Risk  (01/11/2024)  Transportation Needs: No Transportation Needs (01/11/2024)  Utilities: Not At Risk (01/11/2024)  Alcohol Screen: Low Risk  (04/18/2022)  Depression (PHQ2-9): Low Risk  (09/20/2023)  Financial Resource Strain: Low Risk  (12/12/2023)  Physical Activity: Inactive (12/12/2023)  Social Connections:  Moderately Isolated (01/11/2024)  Stress: Stress Concern Present (12/12/2023)  Tobacco Use: Medium Risk (01/10/2024)     Readmission Risk Interventions    01/15/2024    1:16 PM  Readmission Risk Prevention Plan  Transportation Screening Complete  PCP or Specialist Appt within 5-7 Days Complete  Home Care Screening Complete  Medication Review (RN CM) Complete

## 2024-01-15 NOTE — Discharge Summary (Signed)
Physician Discharge Summary  [name]. YQI:[id number] DOB: 27-Mar-1941 DOA: 01/10/2024  PCP: Loyola Mast, MD  Admit date: 01/10/2024 Discharge date: 01/15/2024  Admitted From: Home Disposition: Home  Recommendations for Outpatient Follow-up:  Follow up with PCP in 1 week with repeat CBC/BMP Follow up in ED if symptoms worsen or new appear   Home Health: Home health PT Equipment/Devices: Continue supplemental oxygen via nasal cannula at 2 L/min  Discharge Condition: Stable CODE STATUS: Full Diet recommendation: Heart healthy/carb modified  Brief/Interim Summary: 83 year old gentleman history of COPD, chronic respiratory failure on 2 L nasal cannula, diabetes mellitus, CAD, paroxysmal A-fib on Eliquis, history of CVA with left-sided weakness, chronic HFpEF presented with worsening shortness of breath, rhinorrhea and loose stools.  He tested positive for influenza A.  Chest x-ray showed trace right pleural effusion and patchy airspace disease which could reflect atelectasis versus infiltrate.  He was placed on Tamiflu, IV Solu-Medrol and was also treated with IV antibiotics.  During the hospitalization, his condition is improved.  He has completed course of oral prednisone.  He has been switched to oral antibiotics as well.  He feels better and feels okay to go home today.  He will be discharged home today.  Discharge Diagnoses:   Influenza A acute bronchitis along with concern for possible community-acquired pneumonia Chronic respiratory failure with hypoxia Physical deconditioning -Treated with IV Solu-Medrol and subsequently switched to oral prednisone and patient has already completed course of prednisone.  Completed Tamiflu. -Will need home health PT -Will also empirically treated with IV antibiotics which was switched to oral antibiotics.  Today is day #5.  No further antibiotics needed -Wears 2 L of oxygen at night at home.  Currently requiring 2 L oxygen during daytime as  well.  Continue oxygen supplementation during daytime for now on discharge.  Outpatient follow-up with PCP.  Discharge patient home today.  Chronic diastolic heart failure Hypertension CAD Hyperlipidemia -Compensated.  Continue statin, torsemide, Imdur, Coreg.  Outpatient follow-up with cardiology  Paroxysmal A-fib -Continue Eliquis.  Continue Coreg.  History of CVA -Continue Eliquis.  Outpatient follow-up with neurology  Diabetes mellitus type 2 with hyperglycemia -Carb modified diet.  Resume home regimen.  A1c 6.4 on 12/13/2023  CKD stage IIIb -Creatinine stable.  Outpatient follow-up  History of chronic esophageal dysphagia -Patient noted to have been seen by gastroenterology on 01/01/2024 for evaluation of esophageal dysphagia and stated underwent a barium esophagram 01/08/2024 and was to have referral to general surgery to evaluate to see if Lap-Band may have slipped and for potential Lap-Band deflation pending barium esophagram and was wondering whether he could be seen by general surgery while hospitalized. -Patient seen in consultation by general surgery and fluid removed from his Lap-Band. -Outpatient follow-up with general surgery.  Obesity -Outpatient follow-up  Discharge Instructions  Discharge Instructions     Diet - low sodium heart healthy   Complete by: As directed    Diet Carb Modified   Complete by: As directed    Increase activity slowly   Complete by: As directed       Allergies as of 01/15/2024       Reactions   Enalapril Maleate Cough   REACTION: cough   Lisinopril Cough   Shellfish-derived Products Swelling   Said occurred twice; has eaten some since and had no reactions   Iodine-kelp [iodine] Other (See Comments)   Other reaction(s): Unknown        Medication List     TAKE these medications  albuterol 108 (90 Base) MCG/ACT inhaler Commonly known as: VENTOLIN HFA Inhale 2 puffs into the lungs every 6 (six) hours as needed for wheezing  or shortness of breath.   allopurinol 300 MG tablet Commonly known as: ZYLOPRIM TAKE 1 TABLET BY MOUTH DAILY   apixaban 2.5 MG Tabs tablet Commonly known as: ELIQUIS Take 1 tablet (2.5 mg total) by mouth 2 (two) times daily.   Azelastine HCl 137 MCG/SPRAY Soln Place 2 puffs into the nose every 12 (twelve) hours as needed (Nasal congestion).   calcitRIOL 0.25 MCG capsule Commonly known as: ROCALTROL TAKE 1 CAPSULE BY MOUTH DAILY   carvedilol 25 MG tablet Commonly known as: COREG TAKE 1 TABLET BY MOUTH TWICE  DAILY WITH A MEAL   cetirizine 10 MG tablet Commonly known as: ZYRTEC Take 10 mg by mouth as needed for allergies.   cholecalciferol 25 MCG (1000 UNIT) tablet Commonly known as: VITAMIN D3 Take 1,000 Units by mouth daily.   citalopram 10 MG tablet Commonly known as: CELEXA TAKE 1 TABLET BY MOUTH DAILY   diphenhydrAMINE 25 MG tablet Commonly known as: BENADRYL Take 25 mg by mouth at bedtime.   Farxiga 10 MG Tabs tablet Generic drug: dapagliflozin propanediol TAKE 1 TABLET BY MOUTH DAILY.   ferrous sulfate 324 MG Tbec Take 324 mg by mouth daily at 2 PM.   finasteride 5 MG tablet Commonly known as: PROSCAR TAKE 1 TABLET(5 MG) BY MOUTH DAILY   fluticasone 50 MCG/ACT nasal spray Commonly known as: FLONASE Place 2 sprays into both nostrils daily.   guaiFENesin 600 MG 12 hr tablet Commonly known as: MUCINEX Take 2 tablets (1,200 mg total) by mouth 2 (two) times daily.   hydrocortisone 2.5 % cream Apply 1 application topically.   insulin aspart 100 UNIT/ML injection Commonly known as: novoLOG Inject 7-9 Units into the skin 3 (three) times daily before meals.   isosorbide mononitrate 60 MG 24 hr tablet Commonly known as: IMDUR TAKE 1 TABLET BY MOUTH DAILY   ketoconazole 2 % cream Commonly known as: NIZORAL Apply 1 application topically daily as needed for irritation.   MULTIVITAMIN PO Take 1 tablet by mouth daily.   nitroGLYCERIN 0.4 MG/SPRAY  spray Commonly known as: Nitrolingual Place 1 spray under the tongue as directed.   nystatin cream Commonly known as: MYCOSTATIN Apply 1 application topically 2 (two) times daily.   OXYGEN Inhale 2 L into the lungs at bedtime.   pantoprazole 40 MG tablet Commonly known as: PROTONIX Take 1 tablet (40 mg total) by mouth daily.   polyethylene glycol powder 17 GM/SCOOP powder Commonly known as: GLYCOLAX/MIRALAX Take 17 g by mouth daily as needed (constipation).   rosuvastatin 20 MG tablet Commonly known as: CRESTOR Take 1 tablet (20 mg total) by mouth daily.   Spiriva Respimat 2.5 MCG/ACT Aers Generic drug: Tiotropium Bromide Monohydrate Inhale 1 puff into the lungs daily.   torsemide 20 MG tablet Commonly known as: DEMADEX TAKE 3 TABLETS BY MOUTH DAILY   traZODone 100 MG tablet Commonly known as: DESYREL TAKE 1 TABLET BY MOUTH AT  BEDTIME   Trulicity 4.5 MG/0.5ML Soaj Generic drug: Dulaglutide Inject 4.5 mg as directed once a week.   UNJURY UNFLAVORED PO Take 8-16 oz by mouth daily.   VITAMIN B COMPLEX PO Take 1 tablet by mouth daily.   Wixela Inhub 250-50 MCG/ACT Aepb Generic drug: fluticasone-salmeterol Inhale 1 puff into the lungs in the morning and at bedtime.  Durable Medical Equipment  (From admission, onward)           Start     Ordered   01/15/24 1308  For home use only DME oxygen  Once       Question Answer Comment  Length of Need 6 Months   Mode or (Route) Nasal cannula   Liters per Minute 2   Frequency Continuous (stationary and portable oxygen unit needed)   Oxygen conserving device Yes   Oxygen delivery system Gas      01/15/24 1308            Follow-up Information     [facility] Follow up.   Specialty: Home Health Services Why: Home health has been arranged. They will contact you to schedule apt within 48 hrs post discharge. Contact information: [address] [place]  [address] [phone]         [facility] Follow up.   Why: Home oxygen Contact information: [address] [place] [address] [phone]         Loyola Mast, MD Follow up in 5 day(s).   Specialty: Family Medicine Why: Hospital follow up Contact information: [address] [place] [address] [phone]                Allergies  Allergen Reactions   Enalapril Maleate Cough    REACTION: cough   Lisinopril Cough   Shellfish-Derived Products Swelling    Said occurred twice; has eaten some since and had no reactions   Iodine-Kelp [Iodine] Other (See Comments)    Other reaction(s): Unknown    Consultations: General Surgery   Procedures/Studies: DG Abd 1 View Result Date: 01/12/2024 CLINICAL DATA:  Lap band surgery EXAM: ABDOMEN - 1 VIEW COMPARISON:  05/30/2011 FINDINGS: Lap band device is in place, horizontally oriented in the region of the GE junction. Contrast material seen within the left colon. No bowel obstruction, free air or organomegaly. IMPRESSION: Lap band device horizontally oriented in the region of the GE junction. No bowel obstruction or free air. Electronically Signed   By: Charlett Nose M.D.   On: 01/12/2024 18:42   DG Chest 2 View Result Date: 01/10/2024 CLINICAL DATA:  Shortness of breath. EXAM: CHEST - 2 VIEW COMPARISON:  10/11/2022. FINDINGS: The heart is enlarged and the mediastinal contour is within normal limits. There is atherosclerotic calcification of the aorta. Patchy airspace disease is noted at the right lung base. There is a suspected trace right pleural effusion. Degenerative changes are noted in the thoracic spine. No acute osseous abnormality is seen. IMPRESSION: 1. Patchy airspace disease at the right lung base, possible atelectasis or infiltrate. 2. Suspected trace right pleural effusion. Electronically Signed   By: Thornell Sartorius M.D.   On: 01/10/2024 21:28   DG ESOPHAGUS W DOUBLE CM (HD) Result  Date: 01/08/2024 CLINICAL DATA:  dysphagia, hx of lap banding. EXAM: ESOPHOGRAM/BARIUM SWALLOW TECHNIQUE: Single contrast examination was performed using thin barium or water soluble. FLUOROSCOPY: Radiation Exposure Index (as provided by the fluoroscopic device): 92.7 mGy Kerma COMPARISON:  Upper GI exam from 10/03/2010. FINDINGS: Scout: Visualized bilateral lungs are clear. Left lateral costophrenic angle is clear. No free air under the domes of diaphragm. Patient's pre-existing lap band is not well seen on the PA projection however appears more horizontal in configuration, raising the concern for gastric band slippage. Redemonstration of moderate tertiary contractions throughout the entire esophagus. However, there is no abnormal holdup proximal  to the lap band. There is smooth passage of barium through the lap band with normal stoma measuring up to 4 mm. The proximal pouch is also less than 4 cm in diameter. There is no stricture, ring or web. No hiatal hernia. No spontaneous gastro-esophageal reflux noted. IMPRESSION: 1. Abnormal more horizontal position of the lap band, raising the concern for slippage. 2. Redemonstration of moderate tertiary contractions throughout the esophagus. Electronically Signed   By: Jules Schick M.D.   On: 01/08/2024 10:57      Subjective: Patient seen and examined at bedside.  Worried about his blood sugars and blood pressure but feels able to go home today.  Still short of breath with exertion with intermittent cough.  No fever or vomiting reported.  Discharge Exam: Vitals:   01/15/24 0435 01/15/24 0801  BP: (!) 176/78 (!) 163/83  Pulse: 88 86  Resp:  18  Temp: 97.9 F (36.6 C) 98 F (36.7 C)  SpO2: 100% 99%    General: Pt is alert, awake, not in acute distress.  Elderly male sitting on chair.  On 2 L oxygen by nasal cannula. Cardiovascular: rate controlled, S1/S2 + Respiratory: bilateral decreased breath sounds at bases with scattered crackles Abdominal:  Soft, obese, NT, ND, bowel sounds + Extremities: Trace lower extremity edema; no cyanosis    The results of significant diagnostics from this hospitalization (including imaging, microbiology, ancillary and laboratory) are listed below for reference.     Microbiology: Recent Results (from the past 240 hours)  Resp panel by RT-PCR (RSV, Flu A&B, Covid) Anterior Nasal Swab     Status: Abnormal   Collection Time: 01/10/24  9:31 PM   Specimen: Anterior Nasal Swab  Result Value Ref Range Status   SARS Coronavirus 2 by RT PCR NEGATIVE NEGATIVE Final   Influenza A by PCR POSITIVE (A) NEGATIVE Final   Influenza B by PCR NEGATIVE NEGATIVE Final    Comment: (NOTE) The Xpert Xpress SARS-CoV-2/FLU/RSV plus assay is intended as an aid in the diagnosis of influenza from Nasopharyngeal swab specimens and should not be used as a sole basis for treatment. Nasal washings and aspirates are unacceptable for Xpert Xpress SARS-CoV-2/FLU/RSV testing.  Fact Sheet for Patients: BloggerCourse.com  Fact Sheet for Healthcare Providers: SeriousBroker.it  This test is not yet approved or cleared by the [place] FDA and has been authorized for detection and/or diagnosis of SARS-CoV-2 by FDA under an Emergency Use Authorization (EUA). This EUA will remain in effect (meaning this test can be used) for the duration of the COVID-19 declaration under Section 564(b)(1) of the Act, 21 U.S.C. section 360bbb-3(b)(1), unless the authorization is terminated or revoked.     Resp Syncytial Virus by PCR NEGATIVE NEGATIVE Final    Comment: (NOTE) Fact Sheet for Patients: BloggerCourse.com  Fact Sheet for Healthcare Providers: SeriousBroker.it  This test is not yet approved or cleared by the [place] FDA and has been authorized for detection and/or diagnosis of SARS-CoV-2 by FDA under an Emergency Use  Authorization (EUA). This EUA will remain in effect (meaning this test can be used) for the duration of the COVID-19 declaration under Section 564(b)(1) of the Act, 21 U.S.C. section 360bbb-3(b)(1), unless the authorization is terminated or revoked.  Performed at [facility], [address] [place], [place] [address]      Labs: BNP (last 3 results) Recent Labs    01/10/24 2131  BNP 157.2*   Basic Metabolic Panel: Recent Labs  Lab 01/11/24 0437 01/12/24 0423  01/13/24 0349 01/14/24 0249 01/15/24 0432  NA 134* 138 138 138 132*  K 3.7 3.7 3.7 3.9 3.8  CL 95* 98 97* 99 94*  CO2 25 27 27 26 29   GLUCOSE 398* 219* 192* 114* 227*  BUN 47* 50* 50* 48* 51*  CREATININE 1.91* 1.60* 1.45* 1.64* 1.46*  CALCIUM 8.5* 8.8* 8.7* 8.7* 8.7*   Liver Function Tests: Recent Labs  Lab 01/10/24 2131  AST 23  ALT 17  ALKPHOS 59  BILITOT 0.9  PROT 6.7  ALBUMIN 3.4*   No results for input(s): "LIPASE", "AMYLASE" in the last 168 hours. No results for input(s): "AMMONIA" in the last 168 hours. CBC: Recent Labs  Lab 01/10/24 2131 01/11/24 0437 01/12/24 0423 01/13/24 0349 01/14/24 0249 01/15/24 0432  WBC 8.9 7.8 12.8* 12.4* 13.8* 8.6  NEUTROABS 7.0  --   --   --   --   --   HGB 14.1 13.1 12.7* 12.7* 13.8 13.1  HCT 43.3 40.2 37.9* 38.8* 41.9 40.2  MCV 92.5 93.3 90.5 93.5 92.5 91.0  PLT 203 182 199 200 224 234   Cardiac Enzymes: No results for input(s): "CKTOTAL", "CKMB", "CKMBINDEX", "TROPONINI" in the last 168 hours. BNP: Invalid input(s): "POCBNP" CBG: Recent Labs  Lab 01/14/24 1553 01/14/24 1936 01/14/24 2159 01/15/24 0759 01/15/24 1208  GLUCAP 237* 242* 348* 191* 415*   D-Dimer No results for input(s): "DDIMER" in the last 72 hours. Hgb A1c No results for input(s): "HGBA1C" in the last 72 hours. Lipid Profile No results for input(s): "CHOL", "HDL", "LDLCALC", "TRIG", "CHOLHDL", "LDLDIRECT" in the last 72 hours. Thyroid function studies No results for  input(s): "TSH", "T4TOTAL", "T3FREE", "THYROIDAB" in the last 72 hours.  Invalid input(s): "FREET3" Anemia work up No results for input(s): "VITAMINB12", "FOLATE", "FERRITIN", "TIBC", "IRON", "RETICCTPCT" in the last 72 hours. Urinalysis    Component Value Date/Time   COLORURINE YELLOW 09/20/2023 0945   APPEARANCEUR CLEAR 09/20/2023 0945   LABSPEC <=1.005 (A) 09/20/2023 0945   PHURINE 6.0 09/20/2023 0945   GLUCOSEU >=1000 (A) 09/20/2023 0945   HGBUR NEGATIVE 09/20/2023 0945   BILIRUBINUR neg 12/13/2023 0852   KETONESUR NEGATIVE 09/20/2023 0945   PROTEINUR Positive (A) 12/13/2023 0852   PROTEINUR 100 (A) 07/16/2013 0935   UROBILINOGEN negative (A) 12/13/2023 0852   UROBILINOGEN 0.2 09/20/2023 0945   NITRITE neg 12/13/2023 0852   NITRITE NEGATIVE 09/20/2023 0945   LEUKOCYTESUR Large (3+) (A) 12/13/2023 0852   LEUKOCYTESUR NEGATIVE 09/20/2023 0945   Sepsis Labs Recent Labs  Lab 01/12/24 0423 01/13/24 0349 01/14/24 0249 01/15/24 0432  WBC 12.8* 12.4* 13.8* 8.6   Microbiology Recent Results (from the past 240 hours)  Resp panel by RT-PCR (RSV, Flu A&B, Covid) Anterior Nasal Swab     Status: Abnormal   Collection Time: 01/10/24  9:31 PM   Specimen: Anterior Nasal Swab  Result Value Ref Range Status   SARS Coronavirus 2 by RT PCR NEGATIVE NEGATIVE Final   Influenza A by PCR POSITIVE (A) NEGATIVE Final   Influenza B by PCR NEGATIVE NEGATIVE Final    Comment: (NOTE) The Xpert Xpress SARS-CoV-2/FLU/RSV plus assay is intended as an aid in the diagnosis of influenza from Nasopharyngeal swab specimens and should not be used as a sole basis for treatment. Nasal washings and aspirates are unacceptable for Xpert Xpress SARS-CoV-2/FLU/RSV testing.  Fact Sheet for Patients: BloggerCourse.com  Fact Sheet for Healthcare Providers: SeriousBroker.it  This test is not yet approved or cleared by the [place] FDA and has been  authorized for detection and/or diagnosis of SARS-CoV-2 by FDA under an Emergency Use Authorization (EUA). This EUA will remain in effect (meaning this test can be used) for the duration of the COVID-19 declaration under Section 564(b)(1) of the Act, 21 U.S.C. section 360bbb-3(b)(1), unless the authorization is terminated or revoked.     Resp Syncytial Virus by PCR NEGATIVE NEGATIVE Final    Comment: (NOTE) Fact Sheet for Patients: BloggerCourse.com  Fact Sheet for Healthcare Providers: SeriousBroker.it  This test is not yet approved or cleared by the [place] FDA and has been authorized for detection and/or diagnosis of SARS-CoV-2 by FDA under an Emergency Use Authorization (EUA). This EUA will remain in effect (meaning this test can be used) for the duration of the COVID-19 declaration under Section 564(b)(1) of the Act, 21 U.S.C. section 360bbb-3(b)(1), unless the authorization is terminated or revoked.  Performed at [facility], [address], [place], [place] [address]      Time coordinating discharge: 35 minutes  SIGNED:   Glade Lloyd, MD  [facility] 01/15/2024, 1:24 PM

## 2024-01-15 NOTE — Progress Notes (Signed)
Message sent to unit CM, pt in need of portable O2 for home/travel. Pt uses O2 at night at home and has a concentrator at home, but no portable tank he states. Pt states the MD wants him to wear O2 all the time, pt does not have a portable tank and may also need extention tubing for home concentrator to get about the house.  Pt's primary RN Freida Busman made aware of the above and included him in the secure chat.   Shareen Capwell,RN SWOT

## 2024-01-15 NOTE — TOC Progression Note (Signed)
Transition of Care (TOC) - Progression Note    Patient Details  Name: [name]. MRN: [id number] Date of Birth: 14-Dec-1941  Transition of Care [facility]) CM/SW Contact  Marliss Coots, LCSW Phone Number: 01/15/2024, 10:23 AM  Clinical Narrative:     10:23 AM Per progressions and chart review, physical therapy recommended home health physical therapy for possible discharge home today. TOC will continue to follow.  Expected Discharge Plan: Home w Home Health Services Barriers to Discharge: Continued Medical Work up  Expected Discharge Plan and Services       Living arrangements for the past 2 months: Single Family Home                                       Social Determinants of Health (SDOH) Interventions SDOH Screenings   Food Insecurity: No Food Insecurity (01/11/2024)  Housing: Low Risk  (01/11/2024)  Transportation Needs: No Transportation Needs (01/11/2024)  Utilities: Not At Risk (01/11/2024)  Alcohol Screen: Low Risk  (04/18/2022)  Depression (PHQ2-9): Low Risk  (09/20/2023)  Financial Resource Strain: Low Risk  (12/12/2023)  Physical Activity: Inactive (12/12/2023)  Social Connections: Moderately Isolated (01/11/2024)  Stress: Stress Concern Present (12/12/2023)  Tobacco Use: Medium Risk (01/10/2024)    Readmission Risk Interventions     No data to display

## 2024-01-15 NOTE — Inpatient Diabetes Management (Addendum)
Inpatient Diabetes Program Recommendations  AACE/ADA: New Consensus Statement on Inpatient Glycemic Control (2015)  Target Ranges:  Prepandial:   less than 140 mg/dL      Peak postprandial:   less than 180 mg/dL (1-2 hours)      Critically ill patients:  140 - 180 mg/dL   Lab Results  Component Value Date   GLUCAP 191 (H) 01/15/2024   HGBA1C 6.4 12/13/2023    Review of Glycemic Control  Latest Reference Range & Units 01/14/24 07:51 01/14/24 12:06 01/14/24 15:53 01/14/24 19:36 01/14/24 21:59 01/15/24 07:59  Glucose-Capillary 70 - 99 mg/dL 528 (H) 413 (HH) 244 (H) 242 (H) 348 (H) 191 (H)   Diabetes history: DM 2 Outpatient Diabetes medications: Trulicity 4.5 mg weekly, Farxiga 10 mg Daily, Novolog 7-9 units tid Current orders for Inpatient glycemic control:  Semglee 16 units Novolog 0-9 units tid + hs Novolog 6 units tid mc  Note: glucose trends increased after steroid doses. Steroids no longer ordered. Glucose levels should trend downward.  Thanks,  Christena Deem RN, MSN, BC-ADM Inpatient Diabetes Coordinator Team Pager [phone] (8a-5p)

## 2024-01-15 NOTE — Progress Notes (Signed)
  [name]  Mobility Specialist   Progress Notes    Signed   Date of Service: 01/15/2024  2:40 PM   Signed      Nurse requested Mobility Specialist to perform oxygen saturation test with pt which includes removing pt from oxygen both at rest and while ambulating.  Below are the results from that testing.      Patient Saturations on Room Air at Rest = spO2 96%   Patient Saturations on Room Air while Ambulating = sp02 85% .  Rested and performed pursed lip breathing for 1 minute with sp02 at 87%.   Patient Saturations on 2 Liters of oxygen while Ambulating = sp02 96%   At end of testing pt left in room on 2 Liters of oxygen.   Reported results to nurse.

## 2024-01-15 NOTE — Progress Notes (Signed)
Mobility Specialist Progress Note:   01/15/24 1436  Mobility  Activity Ambulated with assistance in hallway  Level of Assistance Standby assist, set-up cues, supervision of patient - no hands on  Assistive Device Front wheel walker  Distance Ambulated (ft) 120 ft  Activity Response Tolerated well  Mobility Referral Yes  Mobility visit 1 Mobility  Mobility Specialist Start Time (ACUTE ONLY) 1420  Mobility Specialist Stop Time (ACUTE ONLY) 1435  Mobility Specialist Time Calculation (min) (ACUTE ONLY) 15 min   Pre Mobility:  96% SpO2 RA  During Mobility: 76 HR , 85-96% SpO2 RA-2 L Post Mobility: 96% SpO2 2 L  Pt received in chair, agreeable to mobility. Desat to 85% on RA with good pleth. Pursed lip breathing encouraged. Pt needing 2 L to keep sats above 90% during ambulation. Pt c/o SOB but denied any dizziness or lightheadedness. Pt returned to chair with call bell in reach and all needs met. RN notified.   Leory Plowman  Mobility Specialist Please contact via [employer] office at [phone]

## 2024-01-15 NOTE — Progress Notes (Addendum)
Patient discharged.  Tech removed PIV.  Reviewed discharge instructions, medications and follow up appts with patient and family member.  Answered questions.  Gave patient copy of discharge instructions.  Prescriptions were sent to patient's pharmacy.  Oxygen delivered to patient's room No additional questions or concerns at this time.  Tech wheeled patient down to car on 2l o2 nasal cannula.

## 2024-01-15 NOTE — Progress Notes (Signed)
Nurse requested Mobility Specialist to perform oxygen saturation test with pt which includes removing pt from oxygen both at rest and while ambulating.  Below are the results from that testing.     Patient Saturations on Room Air at Rest = spO2 96%  Patient Saturations on Room Air while Ambulating = sp02 85% .  Rested and performed pursed lip breathing for 1 minute with sp02 at 87%.  Patient Saturations on 2 Liters of oxygen while Ambulating = sp02 96%  At end of testing pt left in room on 2 Liters of oxygen.  Reported results to nurse.

## 2024-01-16 ENCOUNTER — Telehealth: Payer: Self-pay | Admitting: *Deleted

## 2024-01-16 ENCOUNTER — Telehealth: Payer: Self-pay

## 2024-01-16 NOTE — Progress Notes (Signed)
Complex Care Management Care Guide Note  01/16/2024 Name: [name]. MRN: [id number] DOB: 26-Apr-1941  [name]. is a 82 y.o. year old male who is a primary care patient of Veto Kemps, Bertram Millard, MD. I reached out to Rica Koyanagi. by phone today to assist with scheduling  with the  Primary Care Physician hospital follow up .  Follow up plan: scheduled 1/29 at 11:00 AM  [name]  [facility] Guide  Direct Dial: [phone]  Fax [phone]

## 2024-01-16 NOTE — Transitions of Care (Post Inpatient/ED Visit) (Signed)
   01/16/2024  Name: [name]. MRN: [id number] DOB: 03/05/1941  Today's TOC FU Call Status: Today's TOC FU Call Status:: Successful TOC FU Call Completed TOC FU Call Complete Date: 01/16/24 Patient's Name and Date of Birth confirmed.  Transition Care Management Follow-up Telephone Call Date of Discharge: 01/15/24 Discharge Facility: [facility]) Type of Discharge: Inpatient Admission Primary Inpatient Discharge Diagnosis:: Acute Bronchitis Secondary to Influenza A How have you been since you were released from the hospital?: Same Any questions or concerns?: Yes Patient Questions/Concerns:: I need a hospital follow up next week with Dr. Veto Kemps Patient Questions/Concerns Addressed: Other: (Care guide, [name] made HFU)  Items Reviewed: Did you receive and understand the discharge instructions provided?: Yes Medications obtained,verified, and reconciled?: No Medications Not Reviewed Reasons:: Other: (Patient was not feeling well and coughing a lot) Any new allergies since your discharge?: No Dietary orders reviewed?: No Do you have support at home?: Yes People in Home: spouse Name of Support/Comfort Primary Source: Bosie Clos  Medications Reviewed Today: Medications Reviewed Today   Medications were not reviewed in this encounter     Home Care and Equipment/Supplies: Were Home Health Services Ordered?: Yes Name of Home Health Agency:: [facility] Has Agency set up a time to come to your home?: No EMR reviewed for Home Health Orders:  (Verified Frances Furbish has orders and will be calling patient) Any new equipment or medical supplies ordered?: Yes Name of Medical supply agency?: [facility] Were you able to get the equipment/medical supplies?: Yes Do you have any questions related to the use of the equipment/supplies?: No  Functional Questionnaire: Do you need assistance with bathing/showering or dressing?: No Do you need assistance with meal preparation?: Yes Do you need  assistance with eating?: No Do you have difficulty maintaining continence: No Do you need assistance with getting out of bed/getting out of a chair/moving?: No Do you have difficulty managing or taking your medications?: No  Follow up appointments reviewed: PCP Follow-up appointment confirmed?: Yes Date of PCP follow-up appointment?: 01/22/24 Follow-up Provider: Dr. Veto Kemps [facility] Follow-up appointment confirmed?: Yes Date of Specialist follow-up appointment?: 01/27/24 Follow-Up Specialty Provider:: Dr. Lonzo Cloud Do you need transportation to your follow-up appointment?: No Do you understand care options if your condition(s) worsen?: Yes-patient verbalized understanding  SDOH Interventions Today    Flowsheet Row Most Recent Value  SDOH Interventions   Food Insecurity Interventions Intervention Not Indicated  Housing Interventions Intervention Not Indicated  Transportation Interventions Intervention Not Indicated  Utilities Interventions Intervention Not Indicated      Jodelle Gross RN, BSN, CCM [facility] RN Care Manager/Transition of Care Direct Dial: [phone]  Fax: [phone]

## 2024-01-22 ENCOUNTER — Encounter: Payer: Self-pay | Admitting: Family Medicine

## 2024-01-22 ENCOUNTER — Ambulatory Visit: Payer: Medicare Other | Admitting: Family Medicine

## 2024-01-22 ENCOUNTER — Telehealth: Payer: Self-pay

## 2024-01-22 VITALS — BP 122/60 | HR 83 | Temp 98.2°F | Ht 63.0 in | Wt 209.0 lb

## 2024-01-22 DIAGNOSIS — J101 Influenza due to other identified influenza virus with other respiratory manifestations: Secondary | ICD-10-CM | POA: Diagnosis not present

## 2024-01-22 DIAGNOSIS — R5381 Other malaise: Secondary | ICD-10-CM | POA: Diagnosis not present

## 2024-01-22 DIAGNOSIS — I5032 Chronic diastolic (congestive) heart failure: Secondary | ICD-10-CM | POA: Diagnosis not present

## 2024-01-22 DIAGNOSIS — J9612 Chronic respiratory failure with hypercapnia: Secondary | ICD-10-CM

## 2024-01-22 DIAGNOSIS — J9611 Chronic respiratory failure with hypoxia: Secondary | ICD-10-CM | POA: Diagnosis not present

## 2024-01-22 NOTE — Assessment & Plan Note (Signed)
Reviewed hospital records. Convalescing from this. We will manage this expectantly.
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
## 2024-01-22 NOTE — Telephone Encounter (Signed)
Called [facility] at [phone] to see what is needed to get the home PT that was ordered from the hospital going.   Spoke to [place] and was unable to reach Vernona Rieger, Investment banker, operational to talk to me.  They will call be back. Dm/cma

## 2024-01-22 NOTE — Assessment & Plan Note (Signed)
Mr. Hofferber was recommended to have home health PT for strengthening. He states he had a call from [facility] on Monday, but that no one has been by to start PT. He feels he does have appropriate medical equipment at home, but does feel he needs some assistance with strengthening. I will have my MA contact [facility] to determine if there is any problem with getting PT started.

## 2024-01-22 NOTE — Assessment & Plan Note (Signed)
Currently using continuous O2 at 2 lpm. I recommend he give a 30 min trial off of his oxygen at some point today. If O2 sat remains >= 95%, he can switch back to nighttime use only.

## 2024-01-22 NOTE — Patient Instructions (Signed)
Increase torsemide to 60 mg twice a day for 5 days. Then resume previous once a day dosing.

## 2024-01-22 NOTE — Telephone Encounter (Signed)
Vernona Rieger called back an they don't do the home health PT, they did send the order to [facility] [phone].  They have been leaving messages for a rtn call at both numbers for patient.   Called patient and gave the number to call to get this started for him. Dm/cma

## 2024-01-22 NOTE — Assessment & Plan Note (Signed)
Mildly decompensated with increased edema. Weight is only up 1-2 lbs. I recommend Mr. Senger increase his torsemide from 20 mg, 3 tabs daily to twice daily for 5 days.

## 2024-01-24 NOTE — Telephone Encounter (Signed)
Called David back from [facility], and gave verbal Okay for PT orders Dm/cma

## 2024-01-24 NOTE — Telephone Encounter (Signed)
Copied from CRM [id number]. Topic: Clinical - Home Health Verbal Orders >> Jan 24, 2024  1:53 PM Bo Mcclintock wrote: Caller/Agency: Home health Callback Number: [phone] Service Requested: Physical Therapy Frequency: 1x a week-1week 2x a week-2weeks 1x a week-3 weeks Any new concerns about the patient? No

## 2024-01-27 ENCOUNTER — Encounter: Payer: Self-pay | Admitting: Internal Medicine

## 2024-01-27 ENCOUNTER — Ambulatory Visit: Payer: Medicare Other | Admitting: Internal Medicine

## 2024-01-27 VITALS — BP 126/72 | HR 76 | Wt 207.0 lb

## 2024-01-27 DIAGNOSIS — N1832 Chronic kidney disease, stage 3b: Secondary | ICD-10-CM | POA: Diagnosis not present

## 2024-01-27 DIAGNOSIS — E1151 Type 2 diabetes mellitus with diabetic peripheral angiopathy without gangrene: Secondary | ICD-10-CM

## 2024-01-27 DIAGNOSIS — E1122 Type 2 diabetes mellitus with diabetic chronic kidney disease: Secondary | ICD-10-CM | POA: Diagnosis not present

## 2024-01-27 DIAGNOSIS — E1159 Type 2 diabetes mellitus with other circulatory complications: Secondary | ICD-10-CM

## 2024-01-27 DIAGNOSIS — Z794 Long term (current) use of insulin: Secondary | ICD-10-CM

## 2024-01-27 LAB — POCT GLUCOSE (DEVICE FOR HOME USE): Glucose Fasting, POC: 197 mg/dL — AB (ref 70–99)
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
## 2024-01-27 NOTE — Patient Instructions (Addendum)
Continue Farxiga 10 mg, 1 tablet daily  Continue Trulicity 4.5 mg weekly  Continue Novolog 9 units with each meal    Novolog correctional insulin: ADD extra units on insulin to your meal-time Novolog  dose if your blood sugars are higher than 160. Use the scale below to help guide you BEFORE each meal if needed   Blood sugar before meal Number of units to inject  Less than 160 0 unit  161 -  190 1 units  191 -  220 2 units  221 -  250 3 units  251 -  280 4 units  281 -  310 5 units  311 -  370 6 units   HOW TO TREAT LOW BLOOD SUGARS (Blood sugar LESS THAN 70 MG/DL) Please follow the RULE OF 15 for the treatment of hypoglycemia treatment (when your (blood sugars are less than 70 mg/dL)   STEP 1: Take 15 grams of carbohydrates when your blood sugar is low, which includes:  3-4 GLUCOSE TABS  OR 3-4 OZ OF JUICE OR REGULAR SODA OR ONE TUBE OF GLUCOSE GEL    STEP 2: RECHECK blood sugar in 15 MINUTES STEP 3: If your blood sugar is still low at the 15 minute recheck --> then, go back to STEP 1 and treat AGAIN with another 15 grams of carbohydrates.

## 2024-02-03 ENCOUNTER — Telehealth: Payer: Self-pay | Admitting: Family Medicine

## 2024-02-03 DIAGNOSIS — I110 Hypertensive heart disease with heart failure: Secondary | ICD-10-CM | POA: Diagnosis not present

## 2024-02-03 DIAGNOSIS — J9611 Chronic respiratory failure with hypoxia: Secondary | ICD-10-CM | POA: Diagnosis not present

## 2024-02-03 DIAGNOSIS — J4489 Other specified chronic obstructive pulmonary disease: Secondary | ICD-10-CM | POA: Diagnosis not present

## 2024-02-03 DIAGNOSIS — I5042 Chronic combined systolic (congestive) and diastolic (congestive) heart failure: Secondary | ICD-10-CM

## 2024-02-03 DIAGNOSIS — N1832 Chronic kidney disease, stage 3b: Secondary | ICD-10-CM

## 2024-02-03 DIAGNOSIS — J9612 Chronic respiratory failure with hypercapnia: Secondary | ICD-10-CM | POA: Diagnosis not present

## 2024-02-03 DIAGNOSIS — E1122 Type 2 diabetes mellitus with diabetic chronic kidney disease: Secondary | ICD-10-CM

## 2024-02-03 DIAGNOSIS — I2510 Atherosclerotic heart disease of native coronary artery without angina pectoris: Secondary | ICD-10-CM

## 2024-02-03 DIAGNOSIS — I69354 Hemiplegia and hemiparesis following cerebral infarction affecting left non-dominant side: Secondary | ICD-10-CM

## 2024-02-03 DIAGNOSIS — I252 Old myocardial infarction: Secondary | ICD-10-CM

## 2024-02-03 DIAGNOSIS — I480 Paroxysmal atrial fibrillation: Secondary | ICD-10-CM

## 2024-02-03 DIAGNOSIS — N2581 Secondary hyperparathyroidism of renal origin: Secondary | ICD-10-CM

## 2024-02-03 NOTE — Telephone Encounter (Signed)
 Form on providers desk to be filled out. Dm/cma

## 2024-02-03 NOTE — Telephone Encounter (Signed)
 Patient dropped off document Home Health Certificate (Order ID [id number]), to be filled out by provider. Patient requested to send it back via Fax within 7-days. Document is located in providers tray at front office.Please advise at  [phone]

## 2024-02-04 NOTE — Telephone Encounter (Signed)
Form faxed and then placed on Kristies desk w/charge sheet. Dm/cma

## 2024-02-07 ENCOUNTER — Other Ambulatory Visit: Payer: Self-pay | Admitting: Family Medicine

## 2024-02-07 DIAGNOSIS — I5032 Chronic diastolic (congestive) heart failure: Secondary | ICD-10-CM

## 2024-02-07 DIAGNOSIS — N2581 Secondary hyperparathyroidism of renal origin: Secondary | ICD-10-CM

## 2024-02-07 DIAGNOSIS — F5101 Primary insomnia: Secondary | ICD-10-CM

## 2024-02-07 NOTE — Telephone Encounter (Signed)
Refill request for  Trazodone 100 mg LR 02/27/23, #90, 3 rf Carvedilol 25 mg LR 02/27/23, #90, 3 rf Calcitriol 0.25 mg LR 02/27/23, #90, 3 rf LOV  01/22/24 FOV 03/02/24   Please review and advise.  Thanks. Dm/cma

## 2024-03-12 ENCOUNTER — Ambulatory Visit: Payer: Medicare Other | Admitting: Family Medicine

## 2024-03-12 ENCOUNTER — Encounter: Payer: Self-pay | Admitting: Family Medicine

## 2024-03-12 VITALS — BP 120/62 | HR 84 | Temp 97.9°F | Ht 63.0 in | Wt 206.0 lb

## 2024-03-12 DIAGNOSIS — I2510 Atherosclerotic heart disease of native coronary artery without angina pectoris: Secondary | ICD-10-CM

## 2024-03-12 DIAGNOSIS — I10 Essential (primary) hypertension: Secondary | ICD-10-CM | POA: Diagnosis not present

## 2024-03-12 DIAGNOSIS — E1122 Type 2 diabetes mellitus with diabetic chronic kidney disease: Secondary | ICD-10-CM | POA: Diagnosis not present

## 2024-03-12 DIAGNOSIS — Z7985 Long-term (current) use of injectable non-insulin antidiabetic drugs: Secondary | ICD-10-CM

## 2024-03-12 DIAGNOSIS — N1832 Chronic kidney disease, stage 3b: Secondary | ICD-10-CM

## 2024-03-12 DIAGNOSIS — I480 Paroxysmal atrial fibrillation: Secondary | ICD-10-CM

## 2024-03-12 DIAGNOSIS — Z794 Long term (current) use of insulin: Secondary | ICD-10-CM | POA: Diagnosis not present

## 2024-03-12 DIAGNOSIS — I5032 Chronic diastolic (congestive) heart failure: Secondary | ICD-10-CM

## 2024-03-12 DIAGNOSIS — M25552 Pain in left hip: Secondary | ICD-10-CM

## 2024-03-12 DIAGNOSIS — E782 Mixed hyperlipidemia: Secondary | ICD-10-CM

## 2024-03-12 DIAGNOSIS — Z7984 Long term (current) use of oral hypoglycemic drugs: Secondary | ICD-10-CM

## 2024-03-12 LAB — HEMOGLOBIN A1C: Hgb A1c MFr Bld: 6.6 % — ABNORMAL HIGH (ref 4.6–6.5)

## 2024-03-12 LAB — GLUCOSE, RANDOM: Glucose, Bld: 155 mg/dL — ABNORMAL HIGH (ref 70–99)

## 2024-03-12 NOTE — Assessment & Plan Note (Signed)
 Stable. Compensated. Continue carvedilol 25 mg bid, torsemide 20 mg daily, and dapagliflozin 10 mg daily.
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
## 2024-03-12 NOTE — Assessment & Plan Note (Signed)
Blood pressure is in good control. Continue carvedilol 25 mg bid.

## 2024-03-12 NOTE — Assessment & Plan Note (Signed)
Stable. No angina. Continue carvedilol 25 mg bid and isosorbide mononitrate 60 mg daily.

## 2024-03-12 NOTE — Assessment & Plan Note (Signed)
 Stable. Continue focus on blood pressure and glucose control, adequate hydration, and avoidance of nephrotoxic medications. Continue SGLT2i.

## 2024-03-12 NOTE — Assessment & Plan Note (Signed)
 At goal. I will check an A1c today. Continue dapagliflozin 10 mg daily, dulaglutide 4.5 mg weekly, and insulin aspart sliding scale (7-9 units TID with meals). Continue to follow with Dr. Lonzo Cloud.

## 2024-03-12 NOTE — Assessment & Plan Note (Signed)
 Stable. Rate controlled. Cotninue carvedilol 25 mg bid and apixiban (Eliquis) 2.5 mg bid.

## 2024-03-12 NOTE — Assessment & Plan Note (Signed)
At goal. Continue rosuvastatin 10 mg daily.

## 2024-05-09 ENCOUNTER — Other Ambulatory Visit: Payer: Self-pay | Admitting: Family Medicine

## 2024-05-09 DIAGNOSIS — N401 Enlarged prostate with lower urinary tract symptoms: Secondary | ICD-10-CM

## 2024-05-25 ENCOUNTER — Ambulatory Visit (INDEPENDENT_AMBULATORY_CARE_PROVIDER_SITE_OTHER): Payer: Medicare Other

## 2024-05-25 DIAGNOSIS — Z0000 Encounter for general adult medical examination without abnormal findings: Secondary | ICD-10-CM

## 2024-05-25 NOTE — Patient Instructions (Signed)
 Mr. Toner , Thank you for taking time out of your busy schedule to complete your Annual Wellness Visit with me. I enjoyed our conversation and look forward to speaking with you again next year. I, as well as your care team,  appreciate your ongoing commitment to your health goals. Please review the following plan we discussed and let me know if I can assist you in the future. Your Game plan/ To Do List    Referrals: If you haven't heard from the office you've been referred to, please reach out to them at the phone provided.  N/a Follow up Visits: Next Medicare AWV with our clinical staff: 05/28/2025 at 2:20   Have you seen your provider in the last 6 months (3 months if uncontrolled diabetes)? Yes Next Office Visit with your provider: 06/18/2024 at 8:40  Clinician Recommendations:  Aim for 30 minutes of exercise or brisk walking, 6-8 glasses of water, and 5 servings of fruits and vegetables each day.       This is a list of the screening recommended for you and due dates:  Health Maintenance  Topic Date Due   COVID-19 Vaccine (7 - 2024-25 season) 10/31/2023   Flu Shot  07/24/2024   Complete foot exam   07/24/2024   Eye exam for diabetics  08/21/2024   Hemoglobin A1C  09/12/2024   Yearly kidney health urinalysis for diabetes  09/19/2024   DTaP/Tdap/Td vaccine (5 - Td or Tdap) 11/11/2024   Yearly kidney function blood test for diabetes  01/14/2025   Medicare Annual Wellness Visit  05/25/2025   Pneumonia Vaccine  Completed   Zoster (Shingles) Vaccine  Completed   HPV Vaccine  Aged Out   Meningitis B Vaccine  Aged Out    Advanced directives: (Copy Requested) Please bring a copy of your health care power of attorney and living will to the office to be added to your chart at your convenience. You can mail to [facility] [address] [place], [place] [address] or email to [email or web address] Advance Care Planning is important because it:  [x]  Makes sure you  receive the medical care that is consistent with your values, goals, and preferences  [x]  It provides guidance to your family and loved ones and reduces their decisional burden about whether or not they are making the right decisions based on your wishes.  Follow the link provided in your after visit summary or read over the paperwork we have mailed to you to help you started getting your Advance Directives in place. If you need assistance in completing these, please reach out to us  so that we can help you!  See attachments for Preventive Care and Fall Prevention Tips.
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
## 2024-06-18 ENCOUNTER — Ambulatory Visit: Admitting: Family Medicine

## 2024-06-18 ENCOUNTER — Telehealth: Payer: Self-pay | Admitting: Cardiovascular Disease

## 2024-06-18 ENCOUNTER — Encounter: Payer: Self-pay | Admitting: Family Medicine

## 2024-06-18 VITALS — BP 132/70 | HR 84 | Temp 97.6°F | Ht 63.0 in | Wt 216.0 lb

## 2024-06-18 DIAGNOSIS — Z794 Long term (current) use of insulin: Secondary | ICD-10-CM | POA: Diagnosis not present

## 2024-06-18 DIAGNOSIS — I5032 Chronic diastolic (congestive) heart failure: Secondary | ICD-10-CM | POA: Diagnosis not present

## 2024-06-18 DIAGNOSIS — I2510 Atherosclerotic heart disease of native coronary artery without angina pectoris: Secondary | ICD-10-CM | POA: Diagnosis not present

## 2024-06-18 DIAGNOSIS — I10 Essential (primary) hypertension: Secondary | ICD-10-CM | POA: Diagnosis not present

## 2024-06-18 DIAGNOSIS — N139 Obstructive and reflux uropathy, unspecified: Secondary | ICD-10-CM

## 2024-06-18 DIAGNOSIS — N1832 Chronic kidney disease, stage 3b: Secondary | ICD-10-CM | POA: Diagnosis not present

## 2024-06-18 DIAGNOSIS — E782 Mixed hyperlipidemia: Secondary | ICD-10-CM

## 2024-06-18 DIAGNOSIS — N401 Enlarged prostate with lower urinary tract symptoms: Secondary | ICD-10-CM

## 2024-06-18 DIAGNOSIS — E1122 Type 2 diabetes mellitus with diabetic chronic kidney disease: Secondary | ICD-10-CM

## 2024-06-18 DIAGNOSIS — I5033 Acute on chronic diastolic (congestive) heart failure: Secondary | ICD-10-CM | POA: Insufficient documentation

## 2024-06-18 LAB — BASIC METABOLIC PANEL WITH GFR
BUN: 50 mg/dL — ABNORMAL HIGH (ref 6–23)
CO2: 35 meq/L — ABNORMAL HIGH (ref 19–32)
Calcium: 9.7 mg/dL (ref 8.4–10.5)
Chloride: 93 meq/L — ABNORMAL LOW (ref 96–112)
Creatinine, Ser: 1.6 mg/dL — ABNORMAL HIGH (ref 0.40–1.50)
GFR: 39.99 mL/min — ABNORMAL LOW (ref >60.00)
Glucose, Bld: 136 mg/dL — ABNORMAL HIGH (ref 70–99)
Potassium: 4.2 meq/L (ref 3.5–5.1)
Sodium: 137 meq/L (ref 135–145)

## 2024-06-18 LAB — HEMOGLOBIN A1C: Hgb A1c MFr Bld: 6.7 % — ABNORMAL HIGH (ref 4.6–6.5)

## 2024-06-18 LAB — BRAIN NATRIURETIC PEPTIDE: Pro B Natriuretic peptide (BNP): 151 pg/mL — ABNORMAL HIGH (ref 0.0–100.0)

## 2024-06-18 MED ORDER — TORSEMIDE 20 MG PO TABS: 80.0000 mg | ORAL_TABLET | Freq: Every day | ORAL | 3 refills | Status: DC

## 2024-06-18 MED ORDER — METOLAZONE 5 MG PO TABS: 5.0000 mg | ORAL_TABLET | Freq: Every day | ORAL | 0 refills | Status: DC

## 2024-06-18 MED ORDER — POTASSIUM CHLORIDE ER 10 MEQ PO TBCR: 20.0000 meq | EXTENDED_RELEASE_TABLET | Freq: Every day | ORAL | 3 refills | Status: DC

## 2024-06-18 NOTE — Assessment & Plan Note (Signed)
-  As above.

## 2024-06-18 NOTE — Assessment & Plan Note (Signed)
 Blood pressure is acceptable. Continue carvedilol  25 mg bid.

## 2024-06-18 NOTE — Assessment & Plan Note (Signed)
 At goal. I will check an A1c today. Continue dapagliflozin 10 mg daily, dulaglutide 4.5 mg weekly, and insulin aspart sliding scale (7-9 units TID with meals). Continue to follow with Dr. Lonzo Cloud.

## 2024-06-18 NOTE — Telephone Encounter (Signed)
 Pt c/o swelling/edema: STAT if pt has developed SOB within 24 hours  If swelling, where is the swelling located? Bi late LE   How much weight have you gained and in what time span? 10 in last month  Have you gained 2 pounds in a day or 5 pounds in a week? -  Do you have a log of your daily weights (if so, list)? Yes 216, 217, 217, 214, 216  Are you currently taking a fluid pill? yes  Are you currently SOB? slight  Have you traveled recently in a car or plane for an extended period of time? No
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
## 2024-06-18 NOTE — Assessment & Plan Note (Signed)
 Appears decompensated with fluid retention. Continue carvedilol  25 mg bid, torsemide  20 mg four tablets daily, and dapagliflozin  10 mg daily. I will check a BNP. I will add metolazone  10 mg daily for 7 days. I will see him back in 2 weeks to reassess.I have asked him to reach out to his cardiologist regarding the recent weight gain.

## 2024-06-18 NOTE — Assessment & Plan Note (Signed)
 Stable. Continue focus on blood pressure and glucose control, adequate hydration, and avoidance of nephrotoxic medications. Continue SGLT2i. Continue to work with nephrology.

## 2024-06-18 NOTE — Assessment & Plan Note (Signed)
 Would consider addition of tamsulosin, but want to wait until his cardiac status is more stable. Continue finasteride  5 mg daily.

## 2024-06-18 NOTE — Assessment & Plan Note (Signed)
Stable. No angina. Continue carvedilol 25 mg bid and isosorbide mononitrate 60 mg daily.

## 2024-06-18 NOTE — Telephone Encounter (Signed)
 Spoke with pt, patient reports swelling in his feet and legs. He reports it started 1 week or so. His weight is up 10 lbs. He has SOB all the time related to COPD and he is having orthopnea. He can not tell if he is out of rhythm or not. He was seen by his medical doctor this morning and was given metolazone  10 mg daily for 7 days. Discussed with dr cooper (DOD), patient instructed to take 5 mg of metolazone  today and 30 minutes later take 2 torsemide . He will then take 5 mg of metolazone  on Saturday and take his 4 torsemide  30 minutes later. He will also take potassium 20 meq once daily. He will come to the office on Monday for BMP. He will see the APP Wednesday 06/24/24 @ 2:45 pm. Patient voiced understanding that if he has problems to make sure to calkl and to not take any more metolazone  unless we tell him to.

## 2024-06-18 NOTE — Assessment & Plan Note (Signed)
 LDL is mildly above goal. Cardiology had recommended increasing the rosuvastatin  to 20 mg daily. However, [name] notes he continues to take only 10 mg daily. He feels he has body aches due to the statin. It is unclear if this is due to his medicines, as this can be explained by other issues.

## 2024-06-19 ENCOUNTER — Telehealth: Payer: Self-pay

## 2024-06-19 ENCOUNTER — Ambulatory Visit: Payer: Self-pay | Admitting: Family Medicine

## 2024-06-19 DIAGNOSIS — I5033 Acute on chronic diastolic (congestive) heart failure: Secondary | ICD-10-CM

## 2024-06-19 MED ORDER — METOLAZONE 5 MG PO TABS: 5.0000 mg | ORAL_TABLET | Freq: Every day | ORAL | 0 refills | Status: DC

## 2024-06-19 NOTE — Telephone Encounter (Signed)
 Copied from CRM [id number]. Topic: Clinical - Prescription Issue >> Jun 18, 2024  1:13 PM Chiquita SQUIBB wrote: Reason for CRM: Patient is calling regarding the metolazone  (ZAROXOLYN ) 5 MG tablet [[id number]], this medication was sent to the home delivery service and it is suppose to be sent to the [facility] pharmacy. Patient is asking if this can be rerouted as the doctor wanted him to get this locally.

## 2024-06-19 NOTE — Telephone Encounter (Signed)
 Rx resent to local pharmacy and left detailed VM that RX was sent. Dm/cma

## 2024-06-23 LAB — BASIC METABOLIC PANEL WITH GFR
BUN/Creatinine Ratio: 35 — ABNORMAL HIGH (ref 10–24)
BUN: 84 mg/dL (ref 8–27)
CO2: 34 mmol/L — ABNORMAL HIGH (ref 20–29)
Calcium: 10.2 mg/dL (ref 8.6–10.2)
Chloride: 77 mmol/L — ABNORMAL LOW (ref 96–106)
Creatinine, Ser: 2.38 mg/dL — ABNORMAL HIGH (ref 0.76–1.27)
Glucose: 179 mg/dL — ABNORMAL HIGH (ref 70–99)
Potassium: 3.5 mmol/L (ref 3.5–5.2)
Sodium: 132 mmol/L — ABNORMAL LOW (ref 134–144)
eGFR: 27 mL/min/{1.73_m2} — ABNORMAL LOW (ref >59)

## 2024-06-24 ENCOUNTER — Other Ambulatory Visit: Payer: Self-pay

## 2024-06-24 ENCOUNTER — Emergency Department (HOSPITAL_COMMUNITY)

## 2024-06-24 ENCOUNTER — Ambulatory Visit: Admitting: Physician Assistant

## 2024-06-24 ENCOUNTER — Inpatient Hospital Stay (HOSPITAL_COMMUNITY)
Admission: EM | Admit: 2024-06-24 | Discharge: 2024-07-01 | DRG: 682 | Disposition: A | Discharge status: Home Health | Attending: Internal Medicine | Admitting: Internal Medicine

## 2024-06-24 ENCOUNTER — Encounter (HOSPITAL_COMMUNITY): Payer: Self-pay

## 2024-06-24 ENCOUNTER — Ambulatory Visit: Payer: Self-pay | Admitting: Cardiovascular Disease

## 2024-06-24 DIAGNOSIS — B9561 Methicillin susceptible Staphylococcus aureus infection as the cause of diseases classified elsewhere: Secondary | ICD-10-CM | POA: Diagnosis present

## 2024-06-24 DIAGNOSIS — Z79899 Other long term (current) drug therapy: Secondary | ICD-10-CM | POA: Diagnosis not present

## 2024-06-24 DIAGNOSIS — J9610 Chronic respiratory failure, unspecified whether with hypoxia or hypercapnia: Secondary | ICD-10-CM | POA: Diagnosis present

## 2024-06-24 DIAGNOSIS — N3000 Acute cystitis without hematuria: Secondary | ICD-10-CM | POA: Diagnosis present

## 2024-06-24 DIAGNOSIS — Z961 Presence of intraocular lens: Secondary | ICD-10-CM | POA: Diagnosis present

## 2024-06-24 DIAGNOSIS — Z87891 Personal history of nicotine dependence: Secondary | ICD-10-CM

## 2024-06-24 DIAGNOSIS — Y92002 Bathroom of unspecified non-institutional (private) residence single-family (private) house as the place of occurrence of the external cause: Secondary | ICD-10-CM | POA: Diagnosis not present

## 2024-06-24 DIAGNOSIS — Z801 Family history of malignant neoplasm of trachea, bronchus and lung: Secondary | ICD-10-CM

## 2024-06-24 DIAGNOSIS — E1151 Type 2 diabetes mellitus with diabetic peripheral angiopathy without gangrene: Secondary | ICD-10-CM | POA: Diagnosis present

## 2024-06-24 DIAGNOSIS — I5033 Acute on chronic diastolic (congestive) heart failure: Secondary | ICD-10-CM | POA: Diagnosis not present

## 2024-06-24 DIAGNOSIS — Z9981 Dependence on supplemental oxygen: Secondary | ICD-10-CM | POA: Diagnosis not present

## 2024-06-24 DIAGNOSIS — Z9884 Bariatric surgery status: Secondary | ICD-10-CM

## 2024-06-24 DIAGNOSIS — I2510 Atherosclerotic heart disease of native coronary artery without angina pectoris: Secondary | ICD-10-CM | POA: Diagnosis present

## 2024-06-24 DIAGNOSIS — M1A9XX0 Chronic gout, unspecified, without tophus (tophi): Secondary | ICD-10-CM | POA: Diagnosis present

## 2024-06-24 DIAGNOSIS — Z86711 Personal history of pulmonary embolism: Secondary | ICD-10-CM

## 2024-06-24 DIAGNOSIS — Z85828 Personal history of other malignant neoplasm of skin: Secondary | ICD-10-CM

## 2024-06-24 DIAGNOSIS — Z833 Family history of diabetes mellitus: Secondary | ICD-10-CM

## 2024-06-24 DIAGNOSIS — K224 Dyskinesia of esophagus: Secondary | ICD-10-CM | POA: Diagnosis present

## 2024-06-24 DIAGNOSIS — K2289 Other specified disease of esophagus: Secondary | ICD-10-CM | POA: Diagnosis not present

## 2024-06-24 DIAGNOSIS — Z955 Presence of coronary angioplasty implant and graft: Secondary | ICD-10-CM

## 2024-06-24 DIAGNOSIS — K2971 Gastritis, unspecified, with bleeding: Secondary | ICD-10-CM | POA: Diagnosis present

## 2024-06-24 DIAGNOSIS — Z7901 Long term (current) use of anticoagulants: Secondary | ICD-10-CM

## 2024-06-24 DIAGNOSIS — T502X5A Adverse effect of carbonic-anhydrase inhibitors, benzothiadiazides and other diuretics, initial encounter: Secondary | ICD-10-CM | POA: Diagnosis present

## 2024-06-24 DIAGNOSIS — E871 Hypo-osmolality and hyponatremia: Secondary | ICD-10-CM | POA: Diagnosis present

## 2024-06-24 DIAGNOSIS — E876 Hypokalemia: Secondary | ICD-10-CM | POA: Diagnosis present

## 2024-06-24 DIAGNOSIS — I130 Hypertensive heart and chronic kidney disease with heart failure and stage 1 through stage 4 chronic kidney disease, or unspecified chronic kidney disease: Secondary | ICD-10-CM | POA: Diagnosis present

## 2024-06-24 DIAGNOSIS — N179 Acute kidney failure, unspecified: Principal | ICD-10-CM | POA: Diagnosis present

## 2024-06-24 DIAGNOSIS — N1832 Chronic kidney disease, stage 3b: Secondary | ICD-10-CM | POA: Diagnosis present

## 2024-06-24 DIAGNOSIS — R0789 Other chest pain: Secondary | ICD-10-CM | POA: Diagnosis present

## 2024-06-24 DIAGNOSIS — E1122 Type 2 diabetes mellitus with diabetic chronic kidney disease: Secondary | ICD-10-CM | POA: Diagnosis present

## 2024-06-24 DIAGNOSIS — Z8711 Personal history of peptic ulcer disease: Secondary | ICD-10-CM

## 2024-06-24 DIAGNOSIS — B3781 Candidal esophagitis: Secondary | ICD-10-CM | POA: Diagnosis present

## 2024-06-24 DIAGNOSIS — Z888 Allergy status to other drugs, medicaments and biological substances status: Secondary | ICD-10-CM

## 2024-06-24 DIAGNOSIS — Z8249 Family history of ischemic heart disease and other diseases of the circulatory system: Secondary | ICD-10-CM

## 2024-06-24 DIAGNOSIS — Z91013 Allergy to seafood: Secondary | ICD-10-CM

## 2024-06-24 DIAGNOSIS — R1310 Dysphagia, unspecified: Secondary | ICD-10-CM | POA: Diagnosis present

## 2024-06-24 DIAGNOSIS — J449 Chronic obstructive pulmonary disease, unspecified: Secondary | ICD-10-CM | POA: Diagnosis present

## 2024-06-24 DIAGNOSIS — N2581 Secondary hyperparathyroidism of renal origin: Secondary | ICD-10-CM | POA: Diagnosis present

## 2024-06-24 DIAGNOSIS — E860 Dehydration: Secondary | ICD-10-CM | POA: Diagnosis present

## 2024-06-24 DIAGNOSIS — D649 Anemia, unspecified: Secondary | ICD-10-CM | POA: Diagnosis present

## 2024-06-24 DIAGNOSIS — Z9841 Cataract extraction status, right eye: Secondary | ICD-10-CM

## 2024-06-24 DIAGNOSIS — Z794 Long term (current) use of insulin: Secondary | ICD-10-CM

## 2024-06-24 DIAGNOSIS — Z800 Family history of malignant neoplasm of digestive organs: Secondary | ICD-10-CM

## 2024-06-24 DIAGNOSIS — R933 Abnormal findings on diagnostic imaging of other parts of digestive tract: Secondary | ICD-10-CM

## 2024-06-24 DIAGNOSIS — W19XXXA Unspecified fall, initial encounter: Secondary | ICD-10-CM | POA: Diagnosis present

## 2024-06-24 DIAGNOSIS — Z87440 Personal history of urinary (tract) infections: Secondary | ICD-10-CM

## 2024-06-24 DIAGNOSIS — K2970 Gastritis, unspecified, without bleeding: Secondary | ICD-10-CM | POA: Diagnosis not present

## 2024-06-24 DIAGNOSIS — Z820 Family history of epilepsy and other diseases of the nervous system: Secondary | ICD-10-CM

## 2024-06-24 DIAGNOSIS — I252 Old myocardial infarction: Secondary | ICD-10-CM

## 2024-06-24 DIAGNOSIS — Z7985 Long-term (current) use of injectable non-insulin antidiabetic drugs: Secondary | ICD-10-CM

## 2024-06-24 DIAGNOSIS — K219 Gastro-esophageal reflux disease without esophagitis: Secondary | ICD-10-CM

## 2024-06-24 DIAGNOSIS — Z9842 Cataract extraction status, left eye: Secondary | ICD-10-CM

## 2024-06-24 DIAGNOSIS — I5032 Chronic diastolic (congestive) heart failure: Secondary | ICD-10-CM

## 2024-06-24 DIAGNOSIS — Z86718 Personal history of other venous thrombosis and embolism: Secondary | ICD-10-CM

## 2024-06-24 DIAGNOSIS — E785 Hyperlipidemia, unspecified: Secondary | ICD-10-CM | POA: Diagnosis present

## 2024-06-24 LAB — COMPREHENSIVE METABOLIC PANEL WITH GFR
ALT: 16 U/L (ref 0–44)
AST: 29 U/L (ref 15–41)
Albumin: 4 g/dL (ref 3.5–5.0)
Alkaline Phosphatase: 68 U/L (ref 38–126)
Anion gap: 19 — ABNORMAL HIGH (ref 5–15)
BUN: 104 mg/dL — ABNORMAL HIGH (ref 8–23)
CO2: 39 mmol/L — ABNORMAL HIGH (ref 22–32)
Calcium: 10.4 mg/dL — ABNORMAL HIGH (ref 8.9–10.3)
Chloride: 71 mmol/L — ABNORMAL LOW (ref 98–111)
Creatinine, Ser: 2.47 mg/dL — ABNORMAL HIGH (ref 0.61–1.24)
GFR, Estimated: 25 mL/min — ABNORMAL LOW (ref >60)
Glucose, Bld: 234 mg/dL — ABNORMAL HIGH (ref 70–99)
Potassium: 3.1 mmol/L — ABNORMAL LOW (ref 3.5–5.1)
Sodium: 129 mmol/L — ABNORMAL LOW (ref 135–145)
Total Bilirubin: 1.4 mg/dL — ABNORMAL HIGH (ref 0.0–1.2)
Total Protein: 7.5 g/dL (ref 6.5–8.1)

## 2024-06-24 LAB — URINALYSIS, W/ REFLEX TO CULTURE (INFECTION SUSPECTED)
Bilirubin Urine: NEGATIVE
Glucose, UA: >=500 mg/dL — AB
Hgb urine dipstick: NEGATIVE
Ketones, ur: NEGATIVE mg/dL
Nitrite: NEGATIVE
Protein, ur: NEGATIVE mg/dL
Specific Gravity, Urine: 1.007 (ref 1.005–1.030)
WBC, UA: >50 WBC/hpf (ref 0–5)
pH: 6 (ref 5.0–8.0)

## 2024-06-24 LAB — PROCALCITONIN: Procalcitonin: 0.11 ng/mL

## 2024-06-24 LAB — CBC
HCT: 45.7 % (ref 39.0–52.0)
Hemoglobin: 15.4 g/dL (ref 13.0–17.0)
MCH: 30.2 pg (ref 26.0–34.0)
MCHC: 33.7 g/dL (ref 30.0–36.0)
MCV: 89.6 fL (ref 80.0–100.0)
Platelets: 217 10*3/uL (ref 150–400)
RBC: 5.1 MIL/uL (ref 4.22–5.81)
RDW: 13.9 % (ref 11.5–15.5)
WBC: 9.8 10*3/uL (ref 4.0–10.5)
nRBC: 0 % (ref 0.0–0.2)

## 2024-06-24 LAB — TROPONIN I (HIGH SENSITIVITY)
Troponin I (High Sensitivity): 14 ng/L (ref <18)
Troponin I (High Sensitivity): 16 ng/L (ref <18)
Troponin I (High Sensitivity): 16 ng/L (ref <18)

## 2024-06-24 LAB — GLUCOSE, CAPILLARY
Glucose-Capillary: 346 mg/dL — ABNORMAL HIGH (ref 70–99)
Glucose-Capillary: 265 mg/dL — ABNORMAL HIGH (ref 70–99)

## 2024-06-24 LAB — BRAIN NATRIURETIC PEPTIDE: B Natriuretic Peptide: 97.1 pg/mL (ref 0.0–100.0)

## 2024-06-24 MED ORDER — UMECLIDINIUM BROMIDE 62.5 MCG/ACT IN AEPB
1.0000 | INHALATION_SPRAY | Freq: Every day | RESPIRATORY_TRACT | Status: DC
  Administered 2024-06-24 – 2024-06-30 (×7): 1 via RESPIRATORY_TRACT
  Filled 2024-06-24: qty 7

## 2024-06-24 MED ORDER — CARVEDILOL 25 MG PO TABS
25.0000 mg | ORAL_TABLET | Freq: Two times a day (BID) | ORAL | Status: DC
  Administered 2024-06-24 – 2024-07-01 (×14): 25 mg via ORAL
  Filled 2024-06-24 (×6): qty 1
  Filled 2024-06-24: qty 2
  Filled 2024-06-24 (×7): qty 1

## 2024-06-24 MED ORDER — ONDANSETRON HCL 4 MG/2ML IJ SOLN
4.0000 mg | Freq: Four times a day (QID) | INTRAMUSCULAR | Status: DC | PRN
  Administered 2024-06-24 – 2024-06-30 (×2): 4 mg via INTRAVENOUS
  Filled 2024-06-24 (×2): qty 2

## 2024-06-24 MED ORDER — SODIUM CHLORIDE 0.9 % IV BOLUS
500.0000 mL | Freq: Once | INTRAVENOUS | Status: AC
  Administered 2024-06-24: 500 mL via INTRAVENOUS

## 2024-06-24 MED ORDER — IPRATROPIUM-ALBUTEROL 0.5-2.5 (3) MG/3ML IN SOLN
3.0000 mL | Freq: Four times a day (QID) | RESPIRATORY_TRACT | Status: DC | PRN
  Administered 2024-06-25: 3 mL via RESPIRATORY_TRACT
  Filled 2024-06-24 (×2): qty 3

## 2024-06-24 MED ORDER — APIXABAN 2.5 MG PO TABS
2.5000 mg | ORAL_TABLET | Freq: Two times a day (BID) | ORAL | Status: DC
  Administered 2024-06-24 – 2024-06-26 (×4): 2.5 mg via ORAL
  Filled 2024-06-24 (×4): qty 1

## 2024-06-24 MED ORDER — INSULIN ASPART 100 UNIT/ML IJ SOLN
0.0000 [IU] | Freq: Three times a day (TID) | INTRAMUSCULAR | Status: DC
  Administered 2024-06-24: 11 [IU] via SUBCUTANEOUS
  Administered 2024-06-25: 8 [IU] via SUBCUTANEOUS

## 2024-06-24 MED ORDER — TIOTROPIUM BROMIDE MONOHYDRATE 2.5 MCG/ACT IN AERS: 1.0000 | INHALATION_SPRAY | Freq: Every day | RESPIRATORY_TRACT | Status: DC

## 2024-06-24 MED ORDER — ACETAMINOPHEN 325 MG PO TABS
650.0000 mg | ORAL_TABLET | Freq: Once | ORAL | Status: AC
  Administered 2024-06-24: 650 mg via ORAL
  Filled 2024-06-24: qty 2

## 2024-06-24 MED ORDER — TRAZODONE HCL 50 MG PO TABS
100.0000 mg | ORAL_TABLET | Freq: Every day | ORAL | Status: DC
  Administered 2024-06-24 – 2024-06-30 (×7): 100 mg via ORAL
  Filled 2024-06-24 (×7): qty 2

## 2024-06-24 MED ORDER — SODIUM CHLORIDE 0.9 % IV SOLN
1.0000 g | INTRAVENOUS | Status: DC
  Administered 2024-06-25: 1 g via INTRAVENOUS
  Filled 2024-06-24: qty 10

## 2024-06-24 MED ORDER — ROSUVASTATIN CALCIUM 20 MG PO TABS
20.0000 mg | ORAL_TABLET | Freq: Every day | ORAL | Status: DC
  Administered 2024-06-24 – 2024-07-01 (×8): 20 mg via ORAL
  Filled 2024-06-24 (×8): qty 1

## 2024-06-24 MED ORDER — FINASTERIDE 5 MG PO TABS
5.0000 mg | ORAL_TABLET | Freq: Every day | ORAL | Status: DC
  Administered 2024-06-24 – 2024-07-01 (×8): 5 mg via ORAL
  Filled 2024-06-24 (×8): qty 1

## 2024-06-24 MED ORDER — ISOSORBIDE MONONITRATE ER 60 MG PO TB24
60.0000 mg | ORAL_TABLET | Freq: Every day | ORAL | Status: DC
  Administered 2024-06-24 – 2024-07-01 (×8): 60 mg via ORAL
  Filled 2024-06-24 (×5): qty 1
  Filled 2024-06-24: qty 2
  Filled 2024-06-24 (×2): qty 1

## 2024-06-24 MED ORDER — SODIUM CHLORIDE 0.9 % IV SOLN: INTRAVENOUS | Status: DC

## 2024-06-24 MED ORDER — SODIUM CHLORIDE 0.9 % IV SOLN
1.0000 g | Freq: Once | INTRAVENOUS | Status: AC
  Administered 2024-06-24: 1 g via INTRAVENOUS
  Filled 2024-06-24: qty 10

## 2024-06-24 MED ORDER — POTASSIUM CHLORIDE CRYS ER 20 MEQ PO TBCR
40.0000 meq | EXTENDED_RELEASE_TABLET | Freq: Once | ORAL | Status: AC
  Administered 2024-06-24: 40 meq via ORAL
  Filled 2024-06-24: qty 2

## 2024-06-24 MED ORDER — ENOXAPARIN SODIUM 30 MG/0.3ML IJ SOSY: 30.0000 mg | PREFILLED_SYRINGE | INTRAMUSCULAR | Status: DC

## 2024-06-24 MED ORDER — FLUTICASONE FUROATE-VILANTEROL 200-25 MCG/ACT IN AEPB
1.0000 | INHALATION_SPRAY | Freq: Every day | RESPIRATORY_TRACT | Status: DC
  Administered 2024-06-25 – 2024-06-30 (×6): 1 via RESPIRATORY_TRACT
  Filled 2024-06-24: qty 28

## 2024-06-24 MED ORDER — CALCITRIOL 0.25 MCG PO CAPS
0.2500 ug | ORAL_CAPSULE | Freq: Every day | ORAL | Status: DC
  Administered 2024-06-24 – 2024-07-01 (×8): 0.25 ug via ORAL
  Filled 2024-06-24 (×8): qty 1

## 2024-06-24 NOTE — ED Notes (Signed)
 Patient called out regarding right arm pain and chest pressure. Patient states he's never had this feeling before. PA at bedside. PA and DO aware. EKG was performed
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
## 2024-06-24 NOTE — ED Provider Notes (Signed)
 [facility] Provider Note   CSN: [id number] Arrival date & time: 06/24/24  9249     Patient presents with: Weakness   [name]. is a 83 y.o. male with PMHx IDA, HTN, CAD, CHF, hyperparathyroidism, OA, GERD, COPD/chronic respiratory failure on 2L O2 at baseline, CKD, paroxysmal A-fib on Eliquis , HLD, DM, CVA who presents to ED concerned for generalized weakness and lightheadedness progressing x5 days. Patient stating that symptoms started when he began starting metolazone  on Friday.   Patient stating that his weakness and lightheadedness caused him to fall yesterday while in the bathroom. Lightheadedness is only present with ambulation. Patient denies vertigo. Patient is on blood thinners but denies head trauma, LOC, or seizures. Patient endorses dorsal right foot pain since fall but does not think that there are any fractures. Patient endorsing a mild headache x2-3 days which does resolve wit Tylenol . Patient denies vision changes. Patient stating that he has been eating and drinking well recently.   Patient came to ED today because he started experiencing chest tightness and diaphoresis around 12AM last night. Patient stating that these symptoms have mostly resolved but still feels a symptom of anxiety in his chest. Patient endorses chronic SOB x10 years which has not worsened recently. Denies cough.  Denies fever, cough, nausea, vomiting, diarrhea, dysuria, hematuria, hematochezia.  Patient endorsing 9lb weight gain since the beginning of may which has resolved since starting metolazone  on Friday. Patient is also on torsemide  for CHF.      Weakness      Prior to Admission medications   Medication Sig Start Date End Date Taking? Authorizing Provider  albuterol  (VENTOLIN  HFA) 108 (90 Base) MCG/ACT inhaler Inhale 2 puffs into the lungs every 6 (six) hours as needed for wheezing or shortness of breath. 10/16/23   Shelah Lamar RAMAN, MD   allopurinol  (ZYLOPRIM ) 300 MG tablet TAKE 1 TABLET BY MOUTH DAILY 11/04/23   [name] HERO, MD  apixaban  (ELIQUIS ) 2.5 MG TABS tablet Take 1 tablet (2.5 mg total) by mouth 2 (two) times daily. 12/24/23 06/18/24  Madie Jon Garre, PA  Azelastine  HCl 137 MCG/SPRAY SOLN Place 2 puffs into the nose every 12 (twelve) hours as needed (Nasal congestion). 10/16/23   Shelah Lamar RAMAN, MD  B Complex Vitamins (VITAMIN B COMPLEX  PO) Take 1 tablet by mouth daily.    [provider]  calcitRIOL  (ROCALTROL ) 0.25 MCG capsule TAKE 1 CAPSULE BY MOUTH DAILY 02/07/24   [name] HERO, MD  carvedilol  (COREG ) 25 MG tablet TAKE 1 TABLET BY MOUTH TWICE  DAILY WITH MEALS 02/07/24   [name] HERO, MD  cetirizine (ZYRTEC) 10 MG tablet Take 10 mg by mouth as needed for allergies.     [provider]  cholecalciferol  (VITAMIN D3) 25 MCG (1000 UNIT) tablet Take 1,000 Units by mouth daily.    [provider]  citalopram  (CELEXA ) 10 MG tablet TAKE 1 TABLET BY MOUTH DAILY 09/20/23   [name] HERO, MD  diphenhydrAMINE (BENADRYL) 25 MG tablet Take 25 mg by mouth at bedtime.    [provider]  Dulaglutide  (TRULICITY ) 4.5 MG/0.5ML SOPN Inject 4.5 mg as directed once a week. 07/25/23   Shamleffer, Ibtehal Jaralla, MD  FARXIGA  10 MG TABS tablet TAKE 1 TABLET BY MOUTH DAILY. 11/25/23   Shamleffer, Ibtehal Jaralla, MD  ferrous sulfate  324 MG TBEC Take 324 mg by mouth daily at 2 PM.    [provider]  finasteride  (PROSCAR ) 5 MG  tablet TAKE 1 TABLET(5 MG) BY MOUTH DAILY 05/11/24   [name] HERO, MD  fluticasone  (FLONASE ) 50 MCG/ACT nasal spray Place 2 sprays into both nostrils daily. 10/16/23   Shelah Lamar RAMAN, MD  fluticasone -salmeterol (WIXELA INHUB) 250-50 MCG/ACT AEPB Inhale 1 puff into the lungs in the morning and at bedtime.    [provider]  guaiFENesin  (MUCINEX ) 600 MG 12 hr tablet Take 2 tablets (1,200 mg total) by mouth 2 (two) times daily. 01/15/24   Cheryle Page, MD   hydrocortisone  2.5 % cream Apply 1 application topically. 11/29/21   [provider]  insulin  aspart (NOVOLOG ) 100 UNIT/ML injection Inject 7-9 Units into the skin 3 (three) times daily before meals. 08/24/21   Kassie Mallick, MD  isosorbide  mononitrate (IMDUR ) 60 MG 24 hr tablet TAKE 1 TABLET BY MOUTH DAILY 12/23/23   [name] HERO, MD  ketoconazole (NIZORAL) 2 % cream Apply 1 application topically daily as needed for irritation.  08/14/13   [provider]  metolazone  (ZAROXOLYN ) 5 MG tablet Take 1 tablet (5 mg total) by mouth daily. 06/19/24   [name] HERO, MD  Multiple Vitamins-Minerals (MULTIVITAMIN PO) Take 1 tablet by mouth daily.    [provider]  nystatin cream (MYCOSTATIN) Apply 1 application topically 2 (two) times daily. 11/29/21   [provider]  OXYGEN  Inhale 2 L into the lungs at bedtime.    [provider]  pantoprazole  (PROTONIX ) 40 MG tablet Take 1 tablet (40 mg total) by mouth daily. 09/20/23   [name] HERO, MD  polyethylene glycol powder (GLYCOLAX/MIRALAX) 17 GM/SCOOP powder Take 17 g by mouth daily as needed (constipation).    [provider]  potassium chloride  (KLOR-CON ) 10 MEQ tablet Take 2 tablets (20 mEq total) by mouth daily. 06/18/24 09/16/24  Wonda Sharper, MD  Protein JEAN UNFLAVORED PO) Take 8-16 oz by mouth daily.    [provider]  rosuvastatin  (CRESTOR ) 20 MG tablet Take 1 tablet (20 mg total) by mouth daily. Patient taking differently: Take 10 mg by mouth daily. 12/24/23   D[facility], Jon Garre, PA  Tiotropium Bromide  Monohydrate (SPIRIVA  RESPIMAT) 2.5 MCG/ACT AERS Inhale 1 puff into the lungs daily.    [provider]  torsemide  (DEMADEX ) 20 MG tablet Take 4 tablets (80 mg total) by mouth daily. 06/18/24   [name] HERO, MD  traZODone  (DESYREL ) 100 MG tablet TAKE 1 TABLET BY MOUTH AT  BEDTIME 02/07/24   [name] HERO, MD    Allergies: Enalapril maleate, Lisinopril, Shellfish-derived  products, and Iodine-kelp [iodine]    Review of Systems  Neurological:  Positive for weakness.    Updated Vital Signs BP (!) 136/58   Pulse 85   Temp 97.8 F (36.6 C) (Oral)   Resp 15   Ht 5' 3 (1.6 m)   Wt 94.8 kg   SpO2 100%   BMI 37.02 kg/m   Physical Exam Vitals and nursing note reviewed.  Constitutional:      General: He is not in acute distress.    Appearance: He is not ill-appearing or toxic-appearing.  HENT:     Head: Normocephalic and atraumatic.     Mouth/Throat:     Mouth: Mucous membranes are moist.  Eyes:     General: No scleral icterus.       Right eye: No discharge.        Left eye: No discharge.     Conjunctiva/sclera: Conjunctivae normal.  Cardiovascular:     Rate and Rhythm:  Normal rate and regular rhythm.     Pulses: Normal pulses.     Heart sounds: Normal heart sounds. No murmur heard. Pulmonary:     Effort: Pulmonary effort is normal. No respiratory distress.     Breath sounds: Normal breath sounds. No wheezing, rhonchi or rales.  Abdominal:     General: Abdomen is flat. Bowel sounds are normal. There is no distension.     Palpations: Abdomen is soft. There is no mass.     Tenderness: There is no abdominal tenderness.  Musculoskeletal:     Right lower leg: No edema.     Left lower leg: No edema.     Comments: +2 pedal pulses. Mild to moderate swelling of BL LE without pitting edema.  Skin:    General: Skin is warm and dry.     Findings: No rash.  Neurological:     General: No focal deficit present.     Mental Status: He is alert and oriented to person, place, and time. Mental status is at baseline.     Comments: GCS 15. Speech is goal oriented. No deficits appreciated to CN III-XII; symmetric eyebrow raise, no facial drooping, tongue midline. Patient has equal grip strength bilaterally with 5/5 strength against resistance in all major muscle groups bilaterally. Sensation to light touch intact. Patient moves extremities without ataxia.   Psychiatric:        Mood and Affect: Mood normal.        Behavior: Behavior normal.     (all labs ordered are listed, but only abnormal results are displayed) Labs Reviewed  COMPREHENSIVE METABOLIC PANEL WITH GFR - Abnormal; Notable for the following components:      Result Value   Sodium 129 (*)    Potassium 3.1 (*)    Chloride 71 (*)    CO2 39 (*)    Glucose, Bld 234 (*)    BUN 104 (*)    Creatinine, Ser 2.47 (*)    Calcium  10.4 (*)    Total Bilirubin 1.4 (*)    GFR, Estimated 25 (*)    Anion gap 19 (*)    All other components within normal limits  URINALYSIS, W/ REFLEX TO CULTURE (INFECTION SUSPECTED) - Abnormal; Notable for the following components:   APPearance HAZY (*)    Glucose, UA >=500 (*)    Leukocytes,Ua LARGE (*)    Bacteria, UA RARE (*)    All other components within normal limits  URINE CULTURE  CBC  BRAIN NATRIURETIC PEPTIDE  TROPONIN I (HIGH SENSITIVITY)  TROPONIN I (HIGH SENSITIVITY)  TROPONIN I (HIGH SENSITIVITY)    EKG: EKG Interpretation Date/Time:  Wednesday June 24 2024 11:51:54 EDT Ventricular Rate:  118 PR Interval:  185 QRS Duration:  116 QT Interval:  337 QTC Calculation: 473 R Axis:   -83  Text Interpretation: Atrial fibrillation Nonspecific IVCD with LAD Consider anterior infarct Otherwise no significant change Confirmed by Emil Share [phone]) on 06/24/2024 11:55:10 AM  Radiology: CT Cervical Spine Wo Contrast Result Date: 06/24/2024 CLINICAL DATA:  83 year old male with dizziness and weakness for 1 week. EXAM: CT CERVICAL SPINE WITHOUT CONTRAST TECHNIQUE: Multidetector CT imaging of the cervical spine was performed without intravenous contrast. Multiplanar CT image reconstructions were also generated. RADIATION DOSE REDUCTION: This exam was performed according to the departmental dose-optimization program which includes automated exposure control, adjustment of the mA and/or kV according to patient size and/or use of iterative  reconstruction technique. COMPARISON:  Head CT today. FINDINGS: Alignment: Mild straightening  of cervical lordosis. Cervicothoracic junction alignment is within normal limits. Bilateral posterior element alignment is within normal limits. Skull base and vertebrae: Bone mineralization is within normal limits for age. Visualized skull base is intact. No atlanto-occipital dissociation. C1 and C2 appear intact and aligned. No acute osseous abnormality identified. Soft tissues and spinal canal: No prevertebral fluid or swelling. No visible canal hematoma. Bulky calcified cervical carotid atherosclerosis, especially on the left. Otherwise negative noncontrast visible neck soft tissues. Disc levels: Cervical spine degeneration, largely age-appropriate. Possible developing degenerative facet ankylosis at C2-C3, C4-C5 on the right. Upper chest: Visible upper thoracic levels appear intact. Negative lung apices. IMPRESSION: 1. No acute traumatic injury identified in the cervical spine. 2. Bulky calcified cervical carotid atherosclerosis. Electronically Signed   By: VEAR Hurst M.D.   On: 06/24/2024 09:43   CT Head Wo Contrast Result Date: 06/24/2024 CLINICAL DATA:  83 year old male with dizziness and weakness for 1 week. EXAM: CT HEAD WITHOUT CONTRAST TECHNIQUE: Contiguous axial images were obtained from the base of the skull through the vertex without intravenous contrast. RADIATION DOSE REDUCTION: This exam was performed according to the departmental dose-optimization program which includes automated exposure control, adjustment of the mA and/or kV according to patient size and/or use of iterative reconstruction technique. COMPARISON:  Brain MRI 02/19/2006.  Head CT 06/11/2011. FINDINGS: Brain: Cerebral volume loss since 2012 appears to be generalized and age-appropriate. No midline shift, ventriculomegaly, mass effect, evidence of mass lesion, intracranial hemorrhage or evidence of cortically based acute infarction. Largely  normal for age gray-white differentiation. No convincing encephalomalacia. Vascular: Advanced Calcified atherosclerosis at the skull base. No suspicious intracranial vascular hyperdensity. Skull: Intact.  No acute osseous abnormality identified. Sinuses/Orbits: Visualized paranasal sinuses and mastoids are stable and well aerated. Other: Calcified scalp vessel atherosclerosis. No acute orbit or scalp soft tissue finding. IMPRESSION: 1. No acute intracranial abnormality. Negative for age noncontrast CT appearance of the brain. 2. Advanced calcified atherosclerosis. Electronically Signed   By: VEAR Hurst M.D.   On: 06/24/2024 09:40   DG Foot Complete Right Result Date: 06/24/2024 CLINICAL DATA:  Pain. EXAM: RIGHT FOOT COMPLETE - 3+ VIEW COMPARISON:  06/12/2011. FINDINGS: There is no evidence of acute fracture or dislocation. Mild first MTP and second through fifth interphalangeal joint space narrowing. Small plantar calcaneal spur. Peripheral vascular calcifications are present. Subcutaneous edema of the dorsal foot. IMPRESSION: No acute osseous abnormality. Electronically Signed   By: Harrietta Sherry M.D.   On: 06/24/2024 09:05   DG Chest 2 View Result Date: 06/24/2024 CLINICAL DATA:  Chest pain.  Weakness. EXAM: CHEST - 2 VIEW COMPARISON:  01/10/2024 FINDINGS: Hyperinflation. Midline trachea. Moderate cardiomegaly. Trace right pleural fluid or thickening. Mild degradation secondary to patient body habitus and AP frontal technique. Recurrent or persistent right base airspace disease. Mild left base volume loss and atelectasis. No congestive failure. IMPRESSION: Mild limitations secondary to AP frontal technique and patient body habitus. Persistent or recurrent right base airspace disease and trace right pleural fluid or thickening. This could represent recurrent pneumonia or pleuroparenchymal scarring, given similarity back to 01/10/2024. Appearance is new since 11/07/2021. Hyperinflation, suggesting COPD.  Cardiomegaly without congestive failure. Electronically Signed   By: Rockey Kilts M.D.   On: 06/24/2024 09:03     .Critical Care  Performed by: Hoy Nidia FALCON, PA-C Authorized by: Hoy Nidia FALCON, PA-C   Critical care provider statement:    Critical care time (minutes):  30   Critical care was necessary to treat or prevent imminent  or life-threatening deterioration of the following conditions:  Renal failure   Critical care was time spent personally by me on the following activities:  Development of treatment plan with patient or surrogate, discussions with consultants, evaluation of patient's response to treatment, examination of patient, ordering and review of laboratory studies, ordering and review of radiographic studies, ordering and performing treatments and interventions, pulse oximetry, re-evaluation of patient's condition and review of old charts   Care discussed with: admitting provider   Comments:     AKI and UTI    Medications Ordered in the ED  ondansetron  (ZOFRAN ) injection 4 mg (4 mg Intravenous Given 06/24/24 1205)  sodium chloride  0.9 % bolus 500 mL (0 mLs Intravenous Stopped 06/24/24 1037)  cefTRIAXone  (ROCEPHIN ) 1 g in sodium chloride  0.9 % 100 mL IVPB (0 g Intravenous Stopped 06/24/24 1122)  potassium chloride  SA (KLOR-CON  M) CR tablet 40 mEq (40 mEq Oral Given 06/24/24 1205)  acetaminophen  (TYLENOL ) tablet 650 mg (650 mg Oral Given 06/24/24 1205)                                    Medical Decision Making Amount and/or Complexity of Data Reviewed Labs: ordered. Radiology: ordered.  Risk OTC drugs. Prescription drug management.   This patient presents to the ED for concern of weakness, this involves an extensive number of treatment options, and is a complaint that carries with it a high risk of complications and morbidity.  The differential diagnosis includes Ischemic stroke, intracerebral hemorrhage, subarachnoid hemorrhage, Guillain-Barr syndrome,  hypoglycemia, electrolyte abnormality, sepsis, ACS, carbon monoxide poisoning, anemia, dehydration.   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  Mild to moderate disease of the left system.  LVEDP of around 12 mmHg depending on R to R interval. Future cardiac catheterizations will require a femoral approach due to lack of radial pulses; copious scar tissue is present in the right groin. 06/18/2024 PCP note: Appears decompensated with fluid retention. Continue carvedilol  25 mg bid, torsemide  20 mg four tablets daily, and dapagliflozin  10 mg daily. I will check a BNP. I will add metolazone  10 mg daily for 7 days. I will see him back in 2 weeks to reassess.I have asked him to reach out to his cardiologist regarding the recent weight gain.    Problem List / ED Course / Critical interventions / Medication management  Patient presents to ED concern for weakness, lightheadedness, chest tightness.  Symptoms started Friday when patient started taking new diuretic.  Physical/neuro exam reassuring.  Patient afebrile with stable vitals. I Ordered, and personally interpreted labs.  Delta troponin negative.  BNP 97.  CBC without leukocytosis or anemia.  CMP with AKI with BUN/creatinine at 104/2.47 today.  There is also hyponatremia at 129, hypokalemia 3.1, and low chloride at 71.  CO2 elevated at 39.  Anion gap 19.  UA concerning for infection with large leukocytes, rare bacteria, and >50 WBC. The patient was maintained on a cardiac monitor.  I personally viewed and interpreted the EKG/cardiac monitored which showed an underlying rhythm of: Sinus rhythm. I ordered imaging  studies including chest/foot x-ray and CT head/cervical spine. I  independently visualized and interpreted imaging which showed no acute process. I agree with the radiologist interpretation. Provided patient with IV ABX, fluids, and oral potassium supplementation. Patient still feeling unsteady.  Of note, after workup was complete in ED, patient started endorsing right sided chest pain radiating down right arm. EKG was obtained and non-ischemic. Doubt ACS at this time but will get 3rd trop at the 6hr mark.  I requested consultation with the hospitalist on-call Dr. Georgina,  and discussed lab and imaging findings as well as pertinent plan - they agree to admit patient. I have reviewed the patients home medicines and have made adjustments as needed    Social Determinants of Health:  geriatric       Final diagnoses:  AKI (acute kidney injury) (HCC)  Acute cystitis without hematuria    ED Discharge Orders          Ordered    Troponin I  Status:  Canceled        06/24/24 1218               Hoy Nidia FALCON, PA-C 06/24/24 1221    Emil Share, DO 06/24/24 1223

## 2024-06-24 NOTE — ED Triage Notes (Signed)
 Patient bib [facility] from home c/o weakness and dizziness for about a week. Patient reports doctor put him on extensive fluid pills and since then has become weaker and hypotensive. Pt reports retaining fluid BLE.  Patient on 2L at home  144/68 83HR 98% 2L  253 cbg

## 2024-06-25 ENCOUNTER — Inpatient Hospital Stay (HOSPITAL_COMMUNITY)

## 2024-06-25 DIAGNOSIS — N179 Acute kidney failure, unspecified: Secondary | ICD-10-CM | POA: Diagnosis not present

## 2024-06-25 LAB — TSH: TSH: 2.554 u[IU]/mL (ref 0.350–4.500)

## 2024-06-25 LAB — BASIC METABOLIC PANEL WITH GFR
Anion gap: 19 — ABNORMAL HIGH (ref 5–15)
BUN: 86 mg/dL — ABNORMAL HIGH (ref 8–23)
CO2: 35 mmol/L — ABNORMAL HIGH (ref 22–32)
Calcium: 9.2 mg/dL (ref 8.9–10.3)
Chloride: 80 mmol/L — ABNORMAL LOW (ref 98–111)
Creatinine, Ser: 2.07 mg/dL — ABNORMAL HIGH (ref 0.61–1.24)
GFR, Estimated: 31 mL/min — ABNORMAL LOW (ref >60)
Glucose, Bld: 169 mg/dL — ABNORMAL HIGH (ref 70–99)
Potassium: 2.8 mmol/L — ABNORMAL LOW (ref 3.5–5.1)
Sodium: 134 mmol/L — ABNORMAL LOW (ref 135–145)

## 2024-06-25 LAB — CBC
HCT: 40.6 % (ref 39.0–52.0)
Hemoglobin: 13.3 g/dL (ref 13.0–17.0)
MCH: 30.4 pg (ref 26.0–34.0)
MCHC: 32.8 g/dL (ref 30.0–36.0)
MCV: 92.7 fL (ref 80.0–100.0)
Platelets: 196 10*3/uL (ref 150–400)
RBC: 4.38 MIL/uL (ref 4.22–5.81)
RDW: 14.2 % (ref 11.5–15.5)
WBC: 9.8 10*3/uL (ref 4.0–10.5)
nRBC: 0 % (ref 0.0–0.2)

## 2024-06-25 LAB — PROCALCITONIN: Procalcitonin: 0.1 ng/mL

## 2024-06-25 LAB — GLUCOSE, CAPILLARY
Glucose-Capillary: 291 mg/dL — ABNORMAL HIGH (ref 70–99)
Glucose-Capillary: 310 mg/dL — ABNORMAL HIGH (ref 70–99)
Glucose-Capillary: 230 mg/dL — ABNORMAL HIGH (ref 70–99)
Glucose-Capillary: 233 mg/dL — ABNORMAL HIGH (ref 70–99)
Glucose-Capillary: 149 mg/dL — ABNORMAL HIGH (ref 70–99)
Glucose-Capillary: 206 mg/dL — ABNORMAL HIGH (ref 70–99)

## 2024-06-25 LAB — OSMOLALITY: Osmolality: 313 mosm/kg — ABNORMAL HIGH (ref 275–295)

## 2024-06-25 LAB — CREATININE, URINE, RANDOM: Creatinine, Urine: 49 mg/dL

## 2024-06-25 LAB — URIC ACID: Uric Acid, Serum: 6.7 mg/dL (ref 3.7–8.6)

## 2024-06-25 LAB — OSMOLALITY, URINE: Osmolality, Ur: 344 mosm/kg (ref 300–900)

## 2024-06-25 LAB — SODIUM, URINE, RANDOM: Sodium, Ur: <10 mmol/L

## 2024-06-25 LAB — MAGNESIUM: Magnesium: 2.4 mg/dL (ref 1.7–2.4)

## 2024-06-25 LAB — VITAMIN B12: Vitamin B-12: 1166 pg/mL — ABNORMAL HIGH (ref 180–914)

## 2024-06-25 LAB — C-REACTIVE PROTEIN: CRP: 0.7 mg/dL (ref <1.0)

## 2024-06-25 MED ORDER — CEFAZOLIN SODIUM-DEXTROSE 1-4 GM/50ML-% IV SOLN
1.0000 g | Freq: Two times a day (BID) | INTRAVENOUS | Status: DC
  Administered 2024-06-25 – 2024-07-01 (×13): 1 g via INTRAVENOUS
  Filled 2024-06-25 (×14): qty 50

## 2024-06-25 MED ORDER — INSULIN GLARGINE-YFGN 100 UNIT/ML ~~LOC~~ SOLN: 12.0000 [IU] | Freq: Every day | SUBCUTANEOUS | Status: DC

## 2024-06-25 MED ORDER — INSULIN ASPART 100 UNIT/ML IJ SOLN
0.0000 [IU] | Freq: Three times a day (TID) | INTRAMUSCULAR | Status: DC
  Administered 2024-06-25: 2 [IU] via SUBCUTANEOUS
  Administered 2024-06-25 – 2024-06-26 (×2): 5 [IU] via SUBCUTANEOUS
  Administered 2024-06-26: 3 [IU] via SUBCUTANEOUS
  Administered 2024-06-27: 15 [IU] via SUBCUTANEOUS
  Administered 2024-06-27: 3 [IU] via SUBCUTANEOUS
  Administered 2024-06-27: 8 [IU] via SUBCUTANEOUS
  Administered 2024-06-28 (×3): 3 [IU] via SUBCUTANEOUS
  Administered 2024-06-29: 5 [IU] via SUBCUTANEOUS
  Administered 2024-06-29: 8 [IU] via SUBCUTANEOUS
  Administered 2024-06-29: 5 [IU] via SUBCUTANEOUS

## 2024-06-25 MED ORDER — POTASSIUM CHLORIDE CRYS ER 20 MEQ PO TBCR
40.0000 meq | EXTENDED_RELEASE_TABLET | Freq: Two times a day (BID) | ORAL | Status: AC
  Administered 2024-06-25 (×2): 40 meq via ORAL
  Filled 2024-06-25 (×2): qty 2

## 2024-06-25 MED ORDER — POTASSIUM CHLORIDE 2 MEQ/ML IV SOLN
INTRAVENOUS | Status: DC
  Filled 2024-06-25 (×2): qty 1000

## 2024-06-25 MED ORDER — SODIUM CHLORIDE 0.9 % IV SOLN: 2.0000 g | INTRAVENOUS | Status: DC

## 2024-06-25 MED ORDER — INSULIN ASPART 100 UNIT/ML IJ SOLN
3.0000 [IU] | Freq: Three times a day (TID) | INTRAMUSCULAR | Status: DC
  Administered 2024-06-25 – 2024-06-29 (×13): 3 [IU] via SUBCUTANEOUS

## 2024-06-25 MED ORDER — INSULIN GLARGINE-YFGN 100 UNIT/ML ~~LOC~~ SOLN
15.0000 [IU] | Freq: Every day | SUBCUTANEOUS | Status: DC
  Administered 2024-06-25 – 2024-06-29 (×5): 15 [IU] via SUBCUTANEOUS
  Filled 2024-06-25 (×6): qty 0.15

## 2024-06-25 MED ORDER — GUAIFENESIN-DM 100-10 MG/5ML PO SYRP
10.0000 mL | ORAL_SOLUTION | ORAL | Status: DC | PRN
  Administered 2024-06-25 – 2024-06-30 (×3): 10 mL via ORAL
  Filled 2024-06-25 (×3): qty 10

## 2024-06-25 MED ORDER — INSULIN ASPART 100 UNIT/ML IJ SOLN
0.0000 [IU] | Freq: Every day | INTRAMUSCULAR | Status: DC
  Administered 2024-06-25: 2 [IU] via SUBCUTANEOUS
  Administered 2024-06-28: 4 [IU] via SUBCUTANEOUS
  Administered 2024-06-29: 2 [IU] via SUBCUTANEOUS
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
## 2024-06-25 NOTE — Plan of Care (Signed)

## 2024-06-25 NOTE — Progress Notes (Signed)
 Urine cx is growing staph aureus. Isodate in Dec came back as MSSA. Ok to change ceftriaxone  to cefazolin per Dr Dennise.  Sergio Batch, PharmD, BCIDP, AAHIVP, CPP Infectious Disease Pharmacist 06/25/2024 12:03 PM

## 2024-06-25 NOTE — Progress Notes (Addendum)
 PROGRESS NOTE                                                                                                                                                                                                             Patient Demographics:    [name], is a 83 y.o. male, DOB - May 17, 1941, FMW:[id number]  Outpatient Primary MD for the patient is Rudd, Garnette HERO, MD    LOS - 1  Admit date - 06/24/2024    Chief Complaint  Patient presents with   Weakness       Brief Narrative (HPI from H&P)   83 y.o. male with medical history significant of HFpEF (EF 55-60%in 10/2021), CAD s/p multiple PCIs, HTN, HLD, DM2, chronic respiratory failure 2/2 COPD (on 2L [place]), CKD, remote PE in 2006 and PVD p/w lethargy and found to have dehydration, AKI 2/2 overdiuresis and possible UTI.    Subjective:    [name] today has, No headache, No chest pain, No abdominal pain - No Nausea, No new weakness tingling or numbness, mild dry cough.   Assessment  & Plan :   Dehydration with AKI, hypokalemia likely due to overdiuresis. Improving with IV fluids and potassium supplementation, continue to hold diuretics and hydrate, monitor renal function.  Will check renal ultrasound as he has history of recurrent UTIs as well.   Presumed acute cystitis - Urine culture inconclusive, specimen likely poor however UA had enough WBCs and he had dysuria suggesting he had UTI, continue empiric Rocephin , he has had UTI in the past as well, has been recommended to follow-up with urology postdischarge.   Chest pain - Atypical likely due to underlying dry cough, troponin negative, chest pain-free now   HTN -PTA Coreg  25mg  BID and Imdur  60mg  daily   HLD -PTA Crestor    COPD -Breo Ellipta  daily + Duonebs    Chronic dysphagia present on admission.  Speech therapy following, likely will require modified barium swallow.    DM type II.  Placed on Semglee ,  Premeal NovoLog  and sliding scale monitor and adjust.  Lab Results  Component Value Date   HGBA1C 6.7 (H) 06/18/2024   CBG (last 3)  Recent Labs    06/24/24 1723 06/24/24 2107 06/25/24 0729  GLUCAP 346* 265* 291*         Condition - Fair  Family  Communication  : None present  Code Status :  Full  Consults  :  None  PUD Prophylaxis :     Procedures  :     Renal ultrasound.    CT head and C-spine.  Nonacute.      Disposition Plan  :    Status is: Inpatient   DVT Prophylaxis  :    apixaban  (ELIQUIS ) tablet 2.5 mg Start: 06/24/24 2200 apixaban  (ELIQUIS ) tablet 2.5 mg    Lab Results  Component Value Date   PLT 196 06/25/2024    Diet :  Diet Order             DIET DYS 2 Fluid consistency: Thin  Diet effective now                    Inpatient Medications  Scheduled Meds:  apixaban   2.5 mg Oral BID   calcitRIOL   0.25 mcg Oral Daily   carvedilol   25 mg Oral BID WC   finasteride   5 mg Oral Daily   fluticasone  furoate-vilanterol  1 puff Inhalation Daily   insulin  aspart  0-15 Units Subcutaneous TID WC   insulin  aspart  0-5 Units Subcutaneous QHS   insulin  aspart  3 Units Subcutaneous TID WC   insulin  glargine-yfgn  15 Units Subcutaneous Daily   isosorbide  mononitrate  60 mg Oral Daily   potassium chloride   40 mEq Oral BID   rosuvastatin   20 mg Oral Daily   traZODone   100 mg Oral QHS   umeclidinium bromide   1 puff Inhalation Daily   Continuous Infusions:  cefTRIAXone  (ROCEPHIN )  IV 1 g (06/25/24 0745)   lactated ringers 1,000 mL with potassium chloride  40 mEq infusion     PRN Meds:.guaiFENesin -dextromethorphan , ipratropium-albuterol , ondansetron  (ZOFRAN ) IV  Antibiotics  :    Anti-infectives (From admission, onward)    Start     Dose/Rate Route Frequency Ordered Stop   06/25/24 0800  cefTRIAXone  (ROCEPHIN ) 1 g in sodium chloride  0.9 % 100 mL IVPB        1 g 200 mL/hr over 30 Minutes Intravenous Every 24 hours 06/24/24 1423 06/29/24  0759   06/24/24 1015  cefTRIAXone  (ROCEPHIN ) 1 g in sodium chloride  0.9 % 100 mL IVPB        1 g 200 mL/hr over 30 Minutes Intravenous  Once 06/24/24 1002 06/24/24 1122         Objective:   Vitals:   06/25/24 0000 06/25/24 0745 06/25/24 0829 06/25/24 0929  BP: (!) 111/57 131/64  122/66  Pulse:  (!) 110 (!) 124 85  Resp:   19   Temp:    97.6 F (36.4 C)  TempSrc:    Oral  SpO2:  97% 96%   Weight:      Height:        Wt Readings from Last 3 Encounters:  06/24/24 94.8 kg  06/18/24 98 kg  03/12/24 93.4 kg     Intake/Output Summary (Last 24 hours) at 06/25/2024 0957 Last data filed at 06/25/2024 0729 Gross per 24 hour  Intake 840 ml  Output 1800 ml  Net -960 ml     Physical Exam  Awake Alert, No new F.N deficits, Normal affect .AT,PERRAL Supple Neck, No JVD,   Symmetrical Chest wall movement, Good air movement bilaterally, CTAB RRR,No Gallops,Rubs or new Murmurs,  +ve B.Sounds, Abd Soft, No tenderness,   No Cyanosis, Clubbing or edema     Data Review:    Recent Labs  Lab 06/24/24 0825 06/25/24 0545  WBC 9.8 9.8  HGB 15.4 13.3  HCT 45.7 40.6  PLT 217 196  MCV 89.6 92.7  MCH 30.2 30.4  MCHC 33.7 32.8  RDW 13.9 14.2    Recent Labs  Lab 06/22/24 1313 06/24/24 0825 06/24/24 2000 06/25/24 0545 06/25/24 0731  NA 132* 129*  --  134*  --   K 3.5 3.1*  --  2.8*  --   CL 77* 71*  --  80*  --   CO2 34* 39*  --  35*  --   ANIONGAP  --  19*  --  19*  --   GLUCOSE 179* 234*  --  169*  --   BUN 84* 104*  --  86*  --   CREATININE 2.38* 2.47*  --  2.07*  --   AST  --  29  --   --   --   ALT  --  16  --   --   --   ALKPHOS  --  68  --   --   --   BILITOT  --  1.4*  --   --   --   ALBUMIN   --  4.0  --   --   --   PROCALCITON  --   --  0.11  --   --   TSH  --   --   --   --  2.554  BNP  --  97.1  --   --   --   MG  --   --   --   --  2.4  CALCIUM  10.2 10.4*  --  9.2  --       Recent Labs  Lab 06/22/24 1313 06/24/24 0825 06/24/24 2000  06/25/24 0545 06/25/24 0731  PROCALCITON  --   --  0.11  --   --   TSH  --   --   --   --  2.554  BNP  --  97.1  --   --   --   MG  --   --   --   --  2.4  CALCIUM  10.2 10.4*  --  9.2  --     --------------------------------------------------------------------------------------------------------------- Lab Results  Component Value Date   CHOL 142 09/20/2023   HDL 52.50 09/20/2023   LDLCALC 71 09/20/2023   TRIG 94.0 09/20/2023   CHOLHDL 3 09/20/2023    Lab Results  Component Value Date   HGBA1C 6.7 (H) 06/18/2024   Recent Labs    06/25/24 0731  TSH 2.554   Recent Labs    06/25/24 0731  VITAMINB12 1,166*    Micro Results Recent Results (from the past 240 hours)  Urine Culture     Status: Abnormal (Preliminary result)   Collection Time: 06/24/24  9:09 AM   Specimen: Urine, Random  Result Value Ref Range Status   Specimen Description URINE, RANDOM  Final   Special Requests   Final    NONE Reflexed from [id number] Performed at [facility], [address] [place], [place] [address]    Culture 80,000 COLONIES/mL GRAM POSITIVE COCCI (A)  Final   Report Status PENDING  Incomplete    Radiology Report DG Chest Port 1 View Result Date: 06/25/2024 CLINICAL DATA:  83 year old male with shortness of breath. EXAM: PORTABLE CHEST 1 VIEW COMPARISON:  Chest radiographs yesterday and earlier. FINDINGS: Portable AP semi upright view at 0621 hours. Stable cardiomegaly and mediastinal contours. Stable  lung volumes. Visualized tracheal air column is within normal limits. Small right pleural effusion again suspected at the costophrenic angle. Similar appearance on prior chest CT 09/09/2018. Otherwise when allowing for portable technique the lungs are clear. Negative visible bowel gas. No acute osseous abnormality identified. IMPRESSION: Small right pleural effusion, probably chronic. Otherwise no acute cardiopulmonary abnormality. Electronically Signed   By: VEAR Hurst M.D.   On:  06/25/2024 06:37   CT Cervical Spine Wo Contrast Result Date: 06/24/2024 CLINICAL DATA:  83 year old male with dizziness and weakness for 1 week. EXAM: CT CERVICAL SPINE WITHOUT CONTRAST TECHNIQUE: Multidetector CT imaging of the cervical spine was performed without intravenous contrast. Multiplanar CT image reconstructions were also generated. RADIATION DOSE REDUCTION: This exam was performed according to the departmental dose-optimization program which includes automated exposure control, adjustment of the mA and/or kV according to patient size and/or use of iterative reconstruction technique. COMPARISON:  Head CT today. FINDINGS: Alignment: Mild straightening of cervical lordosis. Cervicothoracic junction alignment is within normal limits. Bilateral posterior element alignment is within normal limits. Skull base and vertebrae: Bone mineralization is within normal limits for age. Visualized skull base is intact. No atlanto-occipital dissociation. C1 and C2 appear intact and aligned. No acute osseous abnormality identified. Soft tissues and spinal canal: No prevertebral fluid or swelling. No visible canal hematoma. Bulky calcified cervical carotid atherosclerosis, especially on the left. Otherwise negative noncontrast visible neck soft tissues. Disc levels: Cervical spine degeneration, largely age-appropriate. Possible developing degenerative facet ankylosis at C2-C3, C4-C5 on the right. Upper chest: Visible upper thoracic levels appear intact. Negative lung apices. IMPRESSION: 1. No acute traumatic injury identified in the cervical spine. 2. Bulky calcified cervical carotid atherosclerosis. Electronically Signed   By: VEAR Hurst M.D.   On: 06/24/2024 09:43   CT Head Wo Contrast Result Date: 06/24/2024 CLINICAL DATA:  83 year old male with dizziness and weakness for 1 week. EXAM: CT HEAD WITHOUT CONTRAST TECHNIQUE: Contiguous axial images were obtained from the base of the skull through the vertex without  intravenous contrast. RADIATION DOSE REDUCTION: This exam was performed according to the departmental dose-optimization program which includes automated exposure control, adjustment of the mA and/or kV according to patient size and/or use of iterative reconstruction technique. COMPARISON:  Brain MRI 02/19/2006.  Head CT 06/11/2011. FINDINGS: Brain: Cerebral volume loss since 2012 appears to be generalized and age-appropriate. No midline shift, ventriculomegaly, mass effect, evidence of mass lesion, intracranial hemorrhage or evidence of cortically based acute infarction. Largely normal for age gray-white differentiation. No convincing encephalomalacia. Vascular: Advanced Calcified atherosclerosis at the skull base. No suspicious intracranial vascular hyperdensity. Skull: Intact.  No acute osseous abnormality identified. Sinuses/Orbits: Visualized paranasal sinuses and mastoids are stable and well aerated. Other: Calcified scalp vessel atherosclerosis. No acute orbit or scalp soft tissue finding. IMPRESSION: 1. No acute intracranial abnormality. Negative for age noncontrast CT appearance of the brain. 2. Advanced calcified atherosclerosis. Electronically Signed   By: VEAR Hurst M.D.   On: 06/24/2024 09:40   DG Foot Complete Right Result Date: 06/24/2024 CLINICAL DATA:  Pain. EXAM: RIGHT FOOT COMPLETE - 3+ VIEW COMPARISON:  06/12/2011. FINDINGS: There is no evidence of acute fracture or dislocation. Mild first MTP and second through fifth interphalangeal joint space narrowing. Small plantar calcaneal spur. Peripheral vascular calcifications are present. Subcutaneous edema of the dorsal foot. IMPRESSION: No acute osseous abnormality. Electronically Signed   By: Harrietta Sherry M.D.   On: 06/24/2024 09:05   DG Chest 2 View Result Date: 06/24/2024 CLINICAL DATA:  Chest pain.  Weakness. EXAM: CHEST - 2 VIEW COMPARISON:  01/10/2024 FINDINGS: Hyperinflation. Midline trachea. Moderate cardiomegaly. Trace right pleural  fluid or thickening. Mild degradation secondary to patient body habitus and AP frontal technique. Recurrent or persistent right base airspace disease. Mild left base volume loss and atelectasis. No congestive failure. IMPRESSION: Mild limitations secondary to AP frontal technique and patient body habitus. Persistent or recurrent right base airspace disease and trace right pleural fluid or thickening. This could represent recurrent pneumonia or pleuroparenchymal scarring, given similarity back to 01/10/2024. Appearance is new since 11/07/2021. Hyperinflation, suggesting COPD. Cardiomegaly without congestive failure. Electronically Signed   By: Rockey Kilts M.D.   On: 06/24/2024 09:03     Signature  -   Lavada Stank M.D on 06/25/2024 at 9:57 AM   -  To page go to [email or web address]

## 2024-06-25 NOTE — Progress Notes (Signed)
   06/25/24 1030  Spiritual Encounters  Type of Visit Initial  Care provided to: Patient  Referral source Family  Reason for visit Advance directives  OnCall Visit Yes   Chaplain responded to Spiritual Consult for Advance Care Directive.  Chaplain arrived in room and delivered ACD education. Patient wants to wait to discuss with spouse before proceeding.  Instructed Pt how to reach out to Spiritual Care if they have any questions and to contact us  when they are ready to move forward.  Chaplain services remain available as the need arises by Spiritual Consult or for emergent cases, paging [phone].  Chaplain Sugey Trevathan, LPN, CHC, ORDM

## 2024-06-25 NOTE — Evaluation (Signed)
 Physical Therapy Evaluation Patient Details Name: [name]. MRN: [id number] DOB: April 05, 1941 Today's Date: 06/25/2024  History of Present Illness  Pt is an 83 y/o male admitted 7/2 with lethargy and found to have AKI due to over-diuresis and possible UTI.  His Course complicated by a R forefoot/foot sprain making in difficult to mobilize.  EFY:RNEI, chronic respiratory failure, diabetes mellitus, CAD, PAF on Eliquis , history of CVA with left-sided weakness, and chronic HFpEF, Lap-Band  Clinical Impression  Pt admitted with/for AKI with fall and foot sprain.SABRA  Pt currently limited functionally due to the problems listed below.  (see problems list.)  Pt will benefit from PT to maximize function and safety to be able to get home safely with available assist.  Rec.  HHPT after d/c.  Presently, recommending an orthopedic shoe to facilitate more comfortable gait and w/bearing.          If plan is discharge home, recommend the following: A little help with walking and/or transfers;Assistance with cooking/housework;Assist for transportation   Can travel by private vehicle        Equipment Recommendations None recommended by PT  Recommendations for Other Services       Functional Status Assessment Patient has had a recent decline in their functional status and demonstrates the ability to make significant improvements in function in a reasonable and predictable amount of time.     Precautions / Restrictions Precautions Precautions: Fall Recall of Precautions/Restrictions: Intact      Mobility  Bed Mobility               General bed mobility comments: OOB on arrival    Transfers Overall transfer level: Needs assistance   Transfers: Sit to/from Stand Sit to Stand: Contact guard assist           General transfer comment: appropriate use of UE's    Ambulation/Gait Ambulation/Gait assistance: Contact guard assist Gait Distance (Feet): 130 Feet Assistive device:  Rolling walker (2 wheels) Gait Pattern/deviations: Step-through pattern   Gait velocity interpretation: <1.31 ft/sec, indicative of household ambulator   General Gait Details: antalgic gait pattern moderate use of the RW.  Pulse ox not reading, pt on RA for gait and began to get dizzy suggesting Sats falling, color still good  Stairs            Wheelchair Mobility     Tilt Bed    Modified Rankin (Stroke Patients Only)       Balance Overall balance assessment: Needs assistance Sitting-balance support: No upper extremity supported, Feet supported Sitting balance-Leahy Scale: Fair     Standing balance support: Single extremity supported, No upper extremity supported, During functional activity Standing balance-Leahy Scale: Poor Standing balance comment: reliant on the AD                             Pertinent Vitals/Pain Pain Assessment Pain Assessment: Faces Faces Pain Scale: Hurts even more Pain Location: R foot Pain Descriptors / Indicators: Sharp, Sore Pain Intervention(s): Monitored during session, Limited activity within patient's tolerance    Home Living Family/patient expects to be discharged to:: Private residence Living Arrangements: Spouse/significant other;Children Available Help at Discharge: Family;Available 24 hours/day Type of Home: House Home Access: Ramped entrance       Home Layout: One level Home Equipment: Agricultural consultant (2 wheels);Transport chair;Shower seat      Prior Function Prior Level of Function : Independent/Modified Independent  Mobility Comments: independent in home, limited community distances with RW, transport chair for longer distances ADLs Comments: sits to shower, he and his wife take care of their bed bound adult daughter with MS     Extremity/Trunk Assessment   Upper Extremity Assessment Upper Extremity Assessment: Overall WFL for tasks assessed    Lower Extremity Assessment Lower  Extremity Assessment: Overall WFL for tasks assessed;Generalized weakness    Cervical / Trunk Assessment Cervical / Trunk Assessment: Normal  Communication   Communication Communication: No apparent difficulties    Cognition Arousal: Alert Behavior During Therapy: WFL for tasks assessed/performed   PT - Cognitive impairments: No apparent impairments                         Following commands: Intact       Cueing Cueing Techniques: Verbal cues     General Comments      Exercises     Assessment/Plan    PT Assessment Patient needs continued PT services  PT Problem List Decreased strength;Decreased activity tolerance;Decreased balance;Decreased mobility;Decreased knowledge of use of DME;Pain       PT Treatment Interventions DME instruction;Gait training;Stair training;Functional mobility training;Therapeutic activities;Balance training;Patient/family education    PT Goals (Current goals can be found in the Care Plan section)  Acute Rehab PT Goals Patient Stated Goal: Able to be modified Indep at home PT Goal Formulation: With patient Time For Goal Achievement: 07/09/24 Potential to Achieve Goals: Good    Frequency Min 3X/week     Co-evaluation               AM-PAC PT 6 Clicks Mobility  Outcome Measure Help needed turning from your back to your side while in a flat bed without using bedrails?: None Help needed moving from lying on your back to sitting on the side of a flat bed without using bedrails?: None Help needed moving to and from a bed to a chair (including a wheelchair)?: A Little Help needed standing up from a chair using your arms (e.g., wheelchair or bedside chair)?: A Lot Help needed to walk in hospital room?: A Little Help needed climbing 3-5 steps with a railing? : A Lot 6 Click Score: 18    End of Session   Activity Tolerance: Patient tolerated treatment well (hindered by foot pain and fatigue off O2 [place]) Patient left: in  chair;with call bell/phone within reach Nurse Communication: Mobility status PT Visit Diagnosis: Other abnormalities of gait and mobility (R26.89);Difficulty in walking, not elsewhere classified (R26.2)    Time: 8658-8591 PT Time Calculation (min) (ACUTE ONLY): 27 min   Charges:   PT Evaluation $PT Eval Moderate Complexity: 1 Mod PT Treatments $Gait Training: 8-22 mins PT General Charges $$ ACUTE PT VISIT: 1 Visit         06/25/2024  India HERO., PT Acute Rehabilitation Services [phone]  (office)  Vinie GAILS Ineze Serrao 06/25/2024, 2:34 PM

## 2024-06-25 NOTE — Discharge Instructions (Signed)
Information on my medicine - ELIQUIS (apixaban)  This medication education was reviewed with me or my healthcare representative as part of my discharge preparation.    Why was Eliquis prescribed for you? Eliquis was prescribed to treat blood clots that may have been found in the veins of your legs (deep vein thrombosis) or in your lungs (pulmonary embolism) and to reduce the risk of them occurring again.  What do You need to know about Eliquis ? Continue Eliquis 2.5 mg tablet taken TWICE daily.  Eliquis may be taken with or without food.   Try to take the dose about the same time in the morning and in the evening. If you have difficulty swallowing the tablet whole please discuss with your pharmacist how to take the medication safely.  Take Eliquis exactly as prescribed and DO NOT stop taking Eliquis without talking to the doctor who prescribed the medication.  Stopping may increase your risk of developing a new blood clot.  Refill your prescription before you run out.  After discharge, you should have regular check-up appointments with your healthcare provider that is prescribing your Eliquis.    What do you do if you miss a dose? If a dose of ELIQUIS is not taken at the scheduled time, take it as soon as possible on the same day and twice-daily administration should be resumed. The dose should not be doubled to make up for a missed dose.  Important Safety Information A possible side effect of Eliquis is bleeding. You should call your healthcare provider right away if you experience any of the following: Bleeding from an injury or your nose that does not stop. Unusual colored urine (red or dark brown) or unusual colored stools (red or black). Unusual bruising for unknown reasons. A serious fall or if you hit your head (even if there is no bleeding).  Some medicines may interact with Eliquis and might increase your risk of bleeding or clotting while on Eliquis. To help avoid  this, consult your healthcare provider or pharmacist prior to using any new prescription or non-prescription medications, including herbals, vitamins, non-steroidal anti-inflammatory drugs (NSAIDs) and supplements.  This website has more information on Eliquis (apixaban): http://[email or web address].com/eliquis/home

## 2024-06-26 DIAGNOSIS — N179 Acute kidney failure, unspecified: Secondary | ICD-10-CM | POA: Diagnosis not present

## 2024-06-26 DIAGNOSIS — D649 Anemia, unspecified: Secondary | ICD-10-CM

## 2024-06-26 DIAGNOSIS — R933 Abnormal findings on diagnostic imaging of other parts of digestive tract: Secondary | ICD-10-CM

## 2024-06-26 LAB — CBC WITH DIFFERENTIAL/PLATELET
Abs Immature Granulocytes: 0.03 K/uL (ref 0.00–0.07)
Basophils Absolute: 0 K/uL (ref 0.0–0.1)
Basophils Relative: 0 %
Eosinophils Absolute: 0.5 K/uL (ref 0.0–0.5)
Eosinophils Relative: 6 %
HCT: 39.4 % (ref 39.0–52.0)
Hemoglobin: 12.9 g/dL — ABNORMAL LOW (ref 13.0–17.0)
Immature Granulocytes: 0 %
Lymphocytes Relative: 17 %
Lymphs Abs: 1.5 K/uL (ref 0.7–4.0)
MCH: 30.3 pg (ref 26.0–34.0)
MCHC: 32.7 g/dL (ref 30.0–36.0)
MCV: 92.5 fL (ref 80.0–100.0)
Monocytes Absolute: 1.2 K/uL — ABNORMAL HIGH (ref 0.1–1.0)
Monocytes Relative: 13 %
Neutro Abs: 5.4 K/uL (ref 1.7–7.7)
Neutrophils Relative %: 64 %
Platelets: 176 K/uL (ref 150–400)
RBC: 4.26 MIL/uL (ref 4.22–5.81)
RDW: 14.4 % (ref 11.5–15.5)
WBC: 8.6 K/uL (ref 4.0–10.5)
nRBC: 0 % (ref 0.0–0.2)

## 2024-06-26 LAB — PHOSPHORUS: Phosphorus: 3.4 mg/dL (ref 2.5–4.6)

## 2024-06-26 LAB — BASIC METABOLIC PANEL WITH GFR
Anion gap: 13 (ref 5–15)
BUN: 65 mg/dL — ABNORMAL HIGH (ref 8–23)
CO2: 35 mmol/L — ABNORMAL HIGH (ref 22–32)
Calcium: 9.4 mg/dL (ref 8.9–10.3)
Chloride: 90 mmol/L — ABNORMAL LOW (ref 98–111)
Creatinine, Ser: 1.73 mg/dL — ABNORMAL HIGH (ref 0.61–1.24)
GFR, Estimated: 39 mL/min — ABNORMAL LOW (ref >60)
Glucose, Bld: 125 mg/dL — ABNORMAL HIGH (ref 70–99)
Potassium: 3.5 mmol/L (ref 3.5–5.1)
Sodium: 138 mmol/L (ref 135–145)

## 2024-06-26 LAB — UREA NITROGEN, URINE: Urea Nitrogen, Ur: 611 mg/dL

## 2024-06-26 LAB — C-REACTIVE PROTEIN: CRP: 1 mg/dL — ABNORMAL HIGH (ref <1.0)

## 2024-06-26 LAB — GLUCOSE, CAPILLARY
Glucose-Capillary: 164 mg/dL — ABNORMAL HIGH (ref 70–99)
Glucose-Capillary: 219 mg/dL — ABNORMAL HIGH (ref 70–99)
Glucose-Capillary: 124 mg/dL — ABNORMAL HIGH (ref 70–99)
Glucose-Capillary: 103 mg/dL — ABNORMAL HIGH (ref 70–99)
Glucose-Capillary: 143 mg/dL — ABNORMAL HIGH (ref 70–99)

## 2024-06-26 LAB — URINE CULTURE: Culture: 80000 — AB

## 2024-06-26 LAB — MAGNESIUM: Magnesium: 2.4 mg/dL (ref 1.7–2.4)

## 2024-06-26 LAB — PROCALCITONIN: Procalcitonin: <0.1 ng/mL

## 2024-06-26 MED ORDER — ACETAMINOPHEN 325 MG PO TABS
650.0000 mg | ORAL_TABLET | Freq: Four times a day (QID) | ORAL | Status: DC | PRN
  Administered 2024-06-26 – 2024-07-01 (×6): 650 mg via ORAL
  Filled 2024-06-26 (×6): qty 2

## 2024-06-26 NOTE — Progress Notes (Signed)
 PROGRESS NOTE                                                                                                                                                                                                             Patient Demographics:    [name], is a 83 y.o. male, DOB - 1941/10/23, FMW:[id number]  Outpatient Primary MD for the patient is Rudd, Garnette HERO, MD    LOS - 2  Admit date - 06/24/2024    Chief Complaint  Patient presents with   Weakness       Brief Narrative (HPI from H&P)   83 y.o. male with medical history significant of HFpEF (EF 55-60%in 10/2021), CAD s/p multiple PCIs, HTN, HLD, DM2, chronic respiratory failure 2/2 COPD (on 2L ), CKD, remote PE in 2006 and PVD p/w lethargy and found to have dehydration, AKI 2/2 overdiuresis and possible UTI.    Subjective:   Patient in bed, appears comfortable, denies any headache, no fever, no chest pain or pressure, no shortness of breath , no abdominal pain. No focal weakness.  Still has ongoing dysphagia   Assessment  & Plan :   Dehydration with AKI, hypokalemia likely due to overdiuresis.  Improving with IV fluids and potassium supplementation, continue to hold diuretics and hydrate, monitor renal function.  Nonacute renal ultrasound.Presumed acute cystitis - Urine culture inconclusive, specimen likely poor however UA had enough WBCs and he had dysuria suggesting he had UTI, continue empiric Rocephin , he has had UTI in the past as well, has been recommended to follow-up with urology postdischarge.   Chest pain - Atypical likely due to underlying dry cough, troponin negative, chest pain-free now   HTN -PTA Coreg  25mg  BID and Imdur  60mg  daily   HLD -PTA Crestor    COPD -Breo Ellipta  daily + Duonebs    Chronic dysphagia present on admission.  Speech therapy following, evidence of diffuse esophageal spasm on barium swallow, will obtain GI input, speech  therapy to follow.  DM type II.  Placed on Semglee , Premeal NovoLog  and sliding scale monitor and adjust.  Lab Results  Component Value Date   HGBA1C 6.7 (H) 06/18/2024   CBG (last 3)  Recent Labs    06/25/24 1859 06/25/24 2018 06/26/24 0733  GLUCAP 149* 206* 164*         Condition - Fair  Family Communication  : None present  Code Status :  Full  Consults  : GI  PUD Prophylaxis :     Procedures  :     Barium swallow.  Diffuse esophageal spasm.    Renal ultrasound.  Nonacute.  CT head and C-spine.  Nonacute.      Disposition Plan  :    Status is: Inpatient   DVT Prophylaxis  :    apixaban  (ELIQUIS ) tablet 2.5 mg Start: 06/24/24 2200 apixaban  (ELIQUIS ) tablet 2.5 mg    Lab Results  Component Value Date   PLT 176 06/26/2024    Diet :  Diet Order             DIET DYS 2 Fluid consistency: Thin  Diet effective now                    Inpatient Medications  Scheduled Meds:  apixaban   2.5 mg Oral BID   calcitRIOL   0.25 mcg Oral Daily   carvedilol   25 mg Oral BID WC   finasteride   5 mg Oral Daily   fluticasone  furoate-vilanterol  1 puff Inhalation Daily   insulin  aspart  0-15 Units Subcutaneous TID WC   insulin  aspart  0-5 Units Subcutaneous QHS   insulin  aspart  3 Units Subcutaneous TID WC   insulin  glargine-yfgn  15 Units Subcutaneous Daily   isosorbide  mononitrate  60 mg Oral Daily   rosuvastatin   20 mg Oral Daily   traZODone   100 mg Oral QHS   umeclidinium bromide   1 puff Inhalation Daily   Continuous Infusions:   ceFAZolin  (ANCEF ) IV 1 g (06/25/24 2302)   PRN Meds:.guaiFENesin -dextromethorphan , ipratropium-albuterol , ondansetron  (ZOFRAN ) IV  Antibiotics  :    Anti-infectives (From admission, onward)    Start     Dose/Rate Route Frequency Ordered Stop   06/26/24 0800  cefTRIAXone  (ROCEPHIN ) 2 g in sodium chloride  0.9 % 100 mL IVPB  Status:  Discontinued        2 g 200 mL/hr over 30 Minutes Intravenous Every 24 hours 06/25/24  1104 06/25/24 1151   06/25/24 1245  ceFAZolin  (ANCEF ) IVPB 1 g/50 mL premix        1 g 100 mL/hr over 30 Minutes Intravenous Every 12 hours 06/25/24 1151     06/25/24 0800  cefTRIAXone  (ROCEPHIN ) 1 g in sodium chloride  0.9 % 100 mL IVPB  Status:  Discontinued        1 g 200 mL/hr over 30 Minutes Intravenous Every 24 hours 06/24/24 1423 06/25/24 1104   06/24/24 1015  cefTRIAXone  (ROCEPHIN ) 1 g in sodium chloride  0.9 % 100 mL IVPB        1 g 200 mL/hr over 30 Minutes Intravenous  Once 06/24/24 1002 06/24/24 1122         Objective:   Vitals:   06/25/24 2008 06/25/24 2342 06/26/24 0733 06/26/24 0853  BP: 115/60 125/84 (!) 143/126   Pulse: (!) 103 94 97 100  Resp: 18 20 20 20   Temp: 98.2 F (36.8 C)  97.7 F (36.5 C)   TempSrc: Oral  Oral   SpO2: 97% 98% 97% 97%  Weight:      Height:        Wt Readings from Last 3 Encounters:  06/24/24 94.8 kg  06/18/24 98 kg  03/12/24 93.4 kg     Intake/Output Summary (Last 24 hours) at 06/26/2024 0908 Last data filed at 06/25/2024 2303 Gross per 24 hour  Intake --  Output  575 ml  Net -575 ml     Physical Exam  Awake Alert, No new F.N deficits, Normal affect [place].AT,PERRAL Supple Neck, No JVD,   Symmetrical Chest wall movement, Good air movement bilaterally, CTAB RRR,No Gallops, Rubs or new Murmurs,  +ve B.Sounds, Abd Soft, No tenderness,   No Cyanosis, Clubbing or edema       Data Review:    Recent Labs  Lab 06/24/24 0825 06/25/24 0545 06/26/24 0502  WBC 9.8 9.8 8.6  HGB 15.4 13.3 12.9*  HCT 45.7 40.6 39.4  PLT 217 196 176  MCV 89.6 92.7 92.5  MCH 30.2 30.4 30.3  MCHC 33.7 32.8 32.7  RDW 13.9 14.2 14.4  LYMPHSABS  --   --  1.5  MONOABS  --   --  1.2*  EOSABS  --   --  0.5  BASOSABS  --   --  0.0    Recent Labs  Lab 06/22/24 1313 06/24/24 0825 06/24/24 2000 06/25/24 0545 06/25/24 0731 06/26/24 0502  NA 132* 129*  --  134*  --  138  K 3.5 3.1*  --  2.8*  --  3.5  CL 77* 71*  --  80*  --  90*  CO2 34*  39*  --  35*  --  35*  ANIONGAP  --  19*  --  19*  --  13  GLUCOSE 179* 234*  --  169*  --  125*  BUN 84* 104*  --  86*  --  65*  CREATININE 2.38* 2.47*  --  2.07*  --  1.73*  AST  --  29  --   --   --   --   ALT  --  16  --   --   --   --   ALKPHOS  --  68  --   --   --   --   BILITOT  --  1.4*  --   --   --   --   ALBUMIN   --  4.0  --   --   --   --   CRP  --   --   --   --  0.7 1.0*  PROCALCITON  --   --  0.11  --  0.10  --   TSH  --   --   --   --  2.554  --   BNP  --  97.1  --   --   --   --   MG  --   --   --   --  2.4 2.4  PHOS  --   --   --   --   --  3.4  CALCIUM  10.2 10.4*  --  9.2  --  9.4      Recent Labs  Lab 06/22/24 1313 06/24/24 0825 06/24/24 2000 06/25/24 0545 06/25/24 0731 06/26/24 0502  CRP  --   --   --   --  0.7 1.0*  PROCALCITON  --   --  0.11  --  0.10  --   TSH  --   --   --   --  2.554  --   BNP  --  97.1  --   --   --   --   MG  --   --   --   --  2.4 2.4  CALCIUM  10.2 10.4*  --  9.2  --  9.4    --------------------------------------------------------------------------------------------------------------- Lab Results  Component Value Date   CHOL 142 09/20/2023   HDL 52.50 09/20/2023   LDLCALC 71 09/20/2023   TRIG 94.0 09/20/2023   CHOLHDL 3 09/20/2023    Lab Results  Component Value Date   HGBA1C 6.7 (H) 06/18/2024   Recent Labs    06/25/24 0731  TSH 2.554   Recent Labs    06/25/24 0731  VITAMINB12 1,166*    Micro Results Recent Results (from the past 240 hours)  Urine Culture     Status: Abnormal   Collection Time: 06/24/24  9:09 AM   Specimen: Urine, Random  Result Value Ref Range Status   Specimen Description URINE, RANDOM  Final   Special Requests   Final    NONE Reflexed from [id number] Performed at [facility], [address], [place], [place] [address]    Culture 80,000 COLONIES/mL STAPHYLOCOCCUS AUREUS (A)  Final   Report Status 06/26/2024 FINAL  Final   Organism ID, Bacteria STAPHYLOCOCCUS AUREUS (A)   Final      Susceptibility   Staphylococcus aureus - MIC*    CIPROFLOXACIN  <=0.5 SENSITIVE Sensitive     GENTAMICIN <=0.5 SENSITIVE Sensitive     NITROFURANTOIN  <=16 SENSITIVE Sensitive     OXACILLIN 0.5 SENSITIVE Sensitive     TETRACYCLINE <=1 SENSITIVE Sensitive     VANCOMYCIN  <=0.5 SENSITIVE Sensitive     TRIMETH/SULFA <=10 SENSITIVE Sensitive     RIFAMPIN <=0.5 SENSITIVE Sensitive     Inducible Clindamycin NEGATIVE Sensitive     LINEZOLID 4 SENSITIVE Sensitive     * 80,000 COLONIES/mL STAPHYLOCOCCUS AUREUS    Radiology Report US  RENAL Result Date: 06/25/2024 CLINICAL DATA:  Acute kidney injury. EXAM: RENAL / URINARY TRACT ULTRASOUND COMPLETE COMPARISON:  Renal ultrasound 05/30/2022 FINDINGS: Right Kidney: Renal measurements: 9.6 x 5 x 4.6 cm = volume: 106 mL. Thinning of the renal parenchyma with increased echogenicity. No hydronephrosis. The renal cyst on prior exam is not well-defined currently. No visible stone or focal lesion. Left Kidney: Renal measurements: 11 x 5.6 x 4.3 cm = volume: 140 mL. Thinning of the renal parenchyma with increased echogenicity. No hydronephrosis. 2.2 cm cyst arising from the upper pole. Some of the additional cysts on prior exam are not well-defined. Bladder: Appears normal for degree of bladder distention. Other: None. IMPRESSION: 1. Thinning of bilateral renal parenchyma with increased echogenicity consistent with chronic medical renal disease. 2. No obstructive uropathy. 3. Left renal cyst. Additional cysts on prior ultrasound are not seen on the current exam. Electronically Signed   By: Andrea Gasman M.D.   On: 06/25/2024 14:39   DG ESOPHAGUS W SINGLE CM (SOL OR THIN BA) Result Date: 06/25/2024 CLINICAL DATA:  Globus sensation. Coughing. Esophageal reflux. The patient feels like something is stuck in the mid to lower chest. EXAM: ESOPHAGUS/BARIUM SWALLOW/TABLET STUDY TECHNIQUE: Single contrast examination was performed using thin liquid barium. This exam  was performed by Laymon Coast, NP, and was supervised and interpreted by Dr. Dasie Hamburg. FLUOROSCOPY: Radiation Exposure Index (as provided by the fluoroscopic device): 27.5 mGy Kerma COMPARISON:  None Available. FINDINGS: Swallowing: Vestibular penetration without aspiration seen. Pharynx: Unremarkable. Esophagus: No definite mass or stricture. Mild dilatation of the proximal esophagus. Esophageal motility: Frequent non-propulsive/tertiary contractions, particularly within the mid and distal esophagus seen consistently with swallows. No reported associated pain. Hiatal Hernia: None. Gastroesophageal reflux: None visualized. Ingested 13 mm barium tablet: The barium tablet slowly traversed from pharynx to distal esophagus and then became stuck in the distal esophagus just  above the gastroesophageal junction. IMPRESSION: 1. Frequent, prominent tertiary contractions suggestive of diffuse esophageal spasm. Mild dilatation of the proximal esophagus. 2. Barium tablet became stuck in the distal esophagus. 3. Vestibular penetration without aspiration. Electronically Signed   By: Dasie Hamburg M.D.   On: 06/25/2024 14:28   DG Chest Port 1 View Result Date: 06/25/2024 CLINICAL DATA:  83 year old male with shortness of breath. EXAM: PORTABLE CHEST 1 VIEW COMPARISON:  Chest radiographs yesterday and earlier. FINDINGS: Portable AP semi upright view at 0621 hours. Stable cardiomegaly and mediastinal contours. Stable lung volumes. Visualized tracheal air column is within normal limits. Small right pleural effusion again suspected at the costophrenic angle. Similar appearance on prior chest CT 09/09/2018. Otherwise when allowing for portable technique the lungs are clear. Negative visible bowel gas. No acute osseous abnormality identified. IMPRESSION: Small right pleural effusion, probably chronic. Otherwise no acute cardiopulmonary abnormality. Electronically Signed   By: VEAR Hurst M.D.   On: 06/25/2024 06:37   CT Cervical  Spine Wo Contrast Result Date: 06/24/2024 CLINICAL DATA:  83 year old male with dizziness and weakness for 1 week. EXAM: CT CERVICAL SPINE WITHOUT CONTRAST TECHNIQUE: Multidetector CT imaging of the cervical spine was performed without intravenous contrast. Multiplanar CT image reconstructions were also generated. RADIATION DOSE REDUCTION: This exam was performed according to the departmental dose-optimization program which includes automated exposure control, adjustment of the mA and/or kV according to patient size and/or use of iterative reconstruction technique. COMPARISON:  Head CT today. FINDINGS: Alignment: Mild straightening of cervical lordosis. Cervicothoracic junction alignment is within normal limits. Bilateral posterior element alignment is within normal limits. Skull base and vertebrae: Bone mineralization is within normal limits for age. Visualized skull base is intact. No atlanto-occipital dissociation. C1 and C2 appear intact and aligned. No acute osseous abnormality identified. Soft tissues and spinal canal: No prevertebral fluid or swelling. No visible canal hematoma. Bulky calcified cervical carotid atherosclerosis, especially on the left. Otherwise negative noncontrast visible neck soft tissues. Disc levels: Cervical spine degeneration, largely age-appropriate. Possible developing degenerative facet ankylosis at C2-C3, C4-C5 on the right. Upper chest: Visible upper thoracic levels appear intact. Negative lung apices. IMPRESSION: 1. No acute traumatic injury identified in the cervical spine. 2. Bulky calcified cervical carotid atherosclerosis. Electronically Signed   By: VEAR Hurst M.D.   On: 06/24/2024 09:43   CT Head Wo Contrast Result Date: 06/24/2024 CLINICAL DATA:  83 year old male with dizziness and weakness for 1 week. EXAM: CT HEAD WITHOUT CONTRAST TECHNIQUE: Contiguous axial images were obtained from the base of the skull through the vertex without intravenous contrast. RADIATION DOSE  REDUCTION: This exam was performed according to the departmental dose-optimization program which includes automated exposure control, adjustment of the mA and/or kV according to patient size and/or use of iterative reconstruction technique. COMPARISON:  Brain MRI 02/19/2006.  Head CT 06/11/2011. FINDINGS: Brain: Cerebral volume loss since 2012 appears to be generalized and age-appropriate. No midline shift, ventriculomegaly, mass effect, evidence of mass lesion, intracranial hemorrhage or evidence of cortically based acute infarction. Largely normal for age gray-white differentiation. No convincing encephalomalacia. Vascular: Advanced Calcified atherosclerosis at the skull base. No suspicious intracranial vascular hyperdensity. Skull: Intact.  No acute osseous abnormality identified. Sinuses/Orbits: Visualized paranasal sinuses and mastoids are stable and well aerated. Other: Calcified scalp vessel atherosclerosis. No acute orbit or scalp soft tissue finding. IMPRESSION: 1. No acute intracranial abnormality. Negative for age noncontrast CT appearance of the brain. 2. Advanced calcified atherosclerosis. Electronically Signed   By: VEAR Hurst  M.D.   On: 06/24/2024 09:40     Signature  -   Lavada Stank M.D on 06/26/2024 at 9:08 AM   -  To page go to [email or web address]
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
## 2024-06-26 NOTE — Plan of Care (Signed)
  Problem: Nutritional: Goal: Maintenance of adequate nutrition will improve Outcome: Progressing   Problem: Clinical Measurements: Goal: Will remain free from infection Outcome: Progressing Goal: Diagnostic test results will improve Outcome: Progressing

## 2024-06-27 ENCOUNTER — Inpatient Hospital Stay (HOSPITAL_COMMUNITY)

## 2024-06-27 DIAGNOSIS — N179 Acute kidney failure, unspecified: Secondary | ICD-10-CM | POA: Diagnosis not present

## 2024-06-27 LAB — CBC WITH DIFFERENTIAL/PLATELET
Abs Immature Granulocytes: 0.03 K/uL (ref 0.00–0.07)
Basophils Absolute: 0 K/uL (ref 0.0–0.1)
Basophils Relative: 0 %
Eosinophils Absolute: 0.5 K/uL (ref 0.0–0.5)
Eosinophils Relative: 4 %
HCT: 40.8 % (ref 39.0–52.0)
Hemoglobin: 13.4 g/dL (ref 13.0–17.0)
Immature Granulocytes: 0 %
Lymphocytes Relative: 11 %
Lymphs Abs: 1.2 K/uL (ref 0.7–4.0)
MCH: 30.7 pg (ref 26.0–34.0)
MCHC: 32.8 g/dL (ref 30.0–36.0)
MCV: 93.4 fL (ref 80.0–100.0)
Monocytes Absolute: 1.6 K/uL — ABNORMAL HIGH (ref 0.1–1.0)
Monocytes Relative: 14 %
Neutro Abs: 7.9 K/uL — ABNORMAL HIGH (ref 1.7–7.7)
Neutrophils Relative %: 71 %
Platelets: 166 K/uL (ref 150–400)
RBC: 4.37 MIL/uL (ref 4.22–5.81)
RDW: 14.2 % (ref 11.5–15.5)
WBC: 11.2 K/uL — ABNORMAL HIGH (ref 4.0–10.5)
nRBC: 0 % (ref 0.0–0.2)

## 2024-06-27 LAB — MAGNESIUM: Magnesium: 2.1 mg/dL (ref 1.7–2.4)

## 2024-06-27 LAB — PROCALCITONIN: Procalcitonin: <0.1 ng/mL

## 2024-06-27 LAB — BASIC METABOLIC PANEL WITH GFR
Anion gap: 11 (ref 5–15)
BUN: 54 mg/dL — ABNORMAL HIGH (ref 8–23)
CO2: 31 mmol/L (ref 22–32)
Calcium: 9.6 mg/dL (ref 8.9–10.3)
Chloride: 91 mmol/L — ABNORMAL LOW (ref 98–111)
Creatinine, Ser: 1.63 mg/dL — ABNORMAL HIGH (ref 0.61–1.24)
GFR, Estimated: 42 mL/min — ABNORMAL LOW (ref >60)
Glucose, Bld: 198 mg/dL — ABNORMAL HIGH (ref 70–99)
Potassium: 4.1 mmol/L (ref 3.5–5.1)
Sodium: 133 mmol/L — ABNORMAL LOW (ref 135–145)

## 2024-06-27 LAB — URINE CULTURE: Culture: 20000 — AB

## 2024-06-27 LAB — GLUCOSE, CAPILLARY
Glucose-Capillary: 187 mg/dL — ABNORMAL HIGH (ref 70–99)
Glucose-Capillary: 298 mg/dL — ABNORMAL HIGH (ref 70–99)
Glucose-Capillary: 356 mg/dL — ABNORMAL HIGH (ref 70–99)
Glucose-Capillary: 196 mg/dL — ABNORMAL HIGH (ref 70–99)

## 2024-06-27 LAB — C-REACTIVE PROTEIN: CRP: 4.7 mg/dL — ABNORMAL HIGH (ref <1.0)

## 2024-06-27 LAB — URIC ACID: Uric Acid, Serum: 5.9 mg/dL (ref 3.7–8.6)

## 2024-06-27 LAB — PHOSPHORUS: Phosphorus: 3.1 mg/dL (ref 2.5–4.6)

## 2024-06-27 MED ORDER — METHYLPREDNISOLONE SODIUM SUCC 40 MG IJ SOLR
40.0000 mg | Freq: Once | INTRAMUSCULAR | Status: AC
  Administered 2024-06-27: 40 mg via INTRAVENOUS
  Filled 2024-06-27: qty 1

## 2024-06-27 MED ORDER — SODIUM CHLORIDE 0.9 % IV SOLN: INTRAVENOUS | Status: DC

## 2024-06-27 MED ORDER — GUAIFENESIN ER 600 MG PO TB12
600.0000 mg | ORAL_TABLET | Freq: Two times a day (BID) | ORAL | Status: DC
  Administered 2024-06-27 – 2024-07-01 (×9): 600 mg via ORAL
  Filled 2024-06-27 (×9): qty 1

## 2024-06-27 MED ORDER — COLCHICINE 0.6 MG PO TABS
0.6000 mg | ORAL_TABLET | Freq: Two times a day (BID) | ORAL | Status: DC
  Administered 2024-06-27 – 2024-06-29 (×6): 0.6 mg via ORAL
  Filled 2024-06-27 (×8): qty 1

## 2024-06-27 NOTE — Plan of Care (Signed)
  Problem: Metabolic: Goal: Ability to maintain appropriate glucose levels will improve Outcome: Progressing   Problem: Nutritional: Goal: Maintenance of adequate nutrition will improve Outcome: Progressing   Problem: Skin Integrity: Goal: Risk for impaired skin integrity will decrease Outcome: Progressing   Problem: Clinical Measurements: Goal: Will remain free from infection Outcome: Progressing Goal: Diagnostic test results will improve Outcome: Progressing Goal: Respiratory complications will improve Outcome: Progressing

## 2024-06-27 NOTE — Plan of Care (Signed)

## 2024-06-27 NOTE — Progress Notes (Addendum)
 PROGRESS NOTE                                                                                                                                                                                                             Patient Demographics:    [name], is a 83 y.o. male, DOB - 11/29/1941, FMW:[id number]  Outpatient Primary MD for the patient is Rudd, Garnette HERO, MD    LOS - 3  Admit date - 06/24/2024    Chief Complaint  Patient presents with   Weakness       Brief Narrative (HPI from H&P)   83 y.o. male with medical history significant of HFpEF (EF 55-60%in 10/2021), CAD s/p multiple PCIs, HTN, HLD, DM2, chronic respiratory failure 2/2 COPD (on 2L [place]), CKD, remote PE in 2006 and PVD p/w lethargy and found to have dehydration, AKI 2/2 overdiuresis and possible UTI.    Subjective:   Patient in bed, appears comfortable, denies any headache, no fever, no chest pain or pressure, no shortness of breath , no abdominal pain. No focal weakness.  Still concerned about his ongoing dysphagia.   Assessment  & Plan :   Dehydration with AKI, hypokalemia likely due to overdiuresis.  Improving with IV fluids and potassium supplementation, continue to hold diuretics and hydrate, monitor renal function.  Nonacute renal ultrasound.Presumed acute cystitis - Urine culture inconclusive, specimen likely poor however UA had enough WBCs and he had dysuria suggesting he had UTI, continue empiric Rocephin , he has had UTI in the past as well, has been recommended to follow-up with urology postdischarge.  Urine cultures showing 100,000 colonies of Staph aureus.  No signs of systemic infection, unremarkable procalcitonin.   Chest pain - Atypical likely due to underlying dry cough, troponin negative, chest pain-free now   HTN -PTA Coreg  25mg  BID and Imdur  60mg  daily   HLD -PTA Crestor    COPD -Breo Ellipta  daily + Duonebs    Ongoing right foot  pain.  Suspicious for acute gout, he has history of the same, check uric acid, repeat x-ray right foot, challenge with colchicine  and Solu-Medrol  and monitor.    Chronic dysphagia present on admission.  Speech therapy following, evidence of diffuse esophageal spasm on barium swallow, per speech they are considering MBS on coming Monday, GI also following, continue to monitor.  DM type II.  Placed on Semglee , Premeal NovoLog  and sliding scale monitor and adjust.  Lab Results  Component Value Date   HGBA1C 6.7 (H) 06/18/2024   CBG (last 3)  Recent Labs    06/26/24 1846 06/26/24 2209 06/27/24 0827  GLUCAP 103* 143* 187*         Condition - Fair  Family Communication  : None present  Code Status :  Full  Consults  : GI  PUD Prophylaxis :     Procedures  :     Barium swallow.  Diffuse esophageal spasm.    Renal ultrasound.  Nonacute.  CT head and C-spine.  Nonacute.      Disposition Plan  :    Status is: Inpatient   DVT Prophylaxis  :        Lab Results  Component Value Date   PLT 166 06/27/2024    Diet :  Diet Order             Diet NPO time specified Except for: Sips with Meds  Diet effective midnight           DIET DYS 2 Fluid consistency: Thin  Diet effective now                    Inpatient Medications  Scheduled Meds:  calcitRIOL   0.25 mcg Oral Daily   carvedilol   25 mg Oral BID WC   finasteride   5 mg Oral Daily   fluticasone  furoate-vilanterol  1 puff Inhalation Daily   insulin  aspart  0-15 Units Subcutaneous TID WC   insulin  aspart  0-5 Units Subcutaneous QHS   insulin  aspart  3 Units Subcutaneous TID WC   insulin  glargine-yfgn  15 Units Subcutaneous Daily   isosorbide  mononitrate  60 mg Oral Daily   rosuvastatin   20 mg Oral Daily   traZODone   100 mg Oral QHS   umeclidinium bromide   1 puff Inhalation Daily   Continuous Infusions:   ceFAZolin  (ANCEF ) IV 1 g (06/27/24 0852)   PRN Meds:.acetaminophen ,  guaiFENesin -dextromethorphan , ipratropium-albuterol , ondansetron  (ZOFRAN ) IV  Antibiotics  :    Anti-infectives (From admission, onward)    Start     Dose/Rate Route Frequency Ordered Stop   06/26/24 0800  cefTRIAXone  (ROCEPHIN ) 2 g in sodium chloride  0.9 % 100 mL IVPB  Status:  Discontinued        2 g 200 mL/hr over 30 Minutes Intravenous Every 24 hours 06/25/24 1104 06/25/24 1151   06/25/24 1245  ceFAZolin  (ANCEF ) IVPB 1 g/50 mL premix        1 g 100 mL/hr over 30 Minutes Intravenous Every 12 hours 06/25/24 1151     06/25/24 0800  cefTRIAXone  (ROCEPHIN ) 1 g in sodium chloride  0.9 % 100 mL IVPB  Status:  Discontinued        1 g 200 mL/hr over 30 Minutes Intravenous Every 24 hours 06/24/24 1423 06/25/24 1104   06/24/24 1015  cefTRIAXone  (ROCEPHIN ) 1 g in sodium chloride  0.9 % 100 mL IVPB        1 g 200 mL/hr over 30 Minutes Intravenous  Once 06/24/24 1002 06/24/24 1122         Objective:   Vitals:   06/26/24 1935 06/26/24 2311 06/27/24 0341 06/27/24 0830  BP: 124/84 131/71 138/74 (!) 114/53  Pulse: 92 95 90 98  Resp: 18 17 19 19   Temp: 97.9 F (36.6 C) 97.7 F (36.5 C) 97.8 F (36.6 C) 98.6 F (37 C)  TempSrc: Oral Oral Oral Oral  SpO2: 98%     Weight:      Height:        Wt Readings from Last 3 Encounters:  06/24/24 94.8 kg  06/18/24 98 kg  03/12/24 93.4 kg     Intake/Output Summary (Last 24 hours) at 06/27/2024 0930 Last data filed at 06/27/2024 0533 Gross per 24 hour  Intake 150 ml  Output 1715 ml  Net -1565 ml     Physical Exam  Awake Alert, No new F.N deficits, Normal affect [place].AT,PERRAL Supple Neck, No JVD,   Symmetrical Chest wall movement, Good air movement bilaterally, CTAB RRR,No Gallops, Rubs or new Murmurs,  +ve B.Sounds, Abd Soft, No tenderness,   No Cyanosis, Clubbing or edema       Data Review:    Recent Labs  Lab 06/24/24 0825 06/25/24 0545 06/26/24 0502 06/27/24 0429  WBC 9.8 9.8 8.6 11.2*  HGB 15.4 13.3 12.9* 13.4  HCT  45.7 40.6 39.4 40.8  PLT 217 196 176 166  MCV 89.6 92.7 92.5 93.4  MCH 30.2 30.4 30.3 30.7  MCHC 33.7 32.8 32.7 32.8  RDW 13.9 14.2 14.4 14.2  LYMPHSABS  --   --  1.5 1.2  MONOABS  --   --  1.2* 1.6*  EOSABS  --   --  0.5 0.5  BASOSABS  --   --  0.0 0.0    Recent Labs  Lab 06/22/24 1313 06/24/24 0825 06/24/24 2000 06/25/24 0545 06/25/24 0731 06/26/24 0502 06/27/24 0429  NA 132* 129*  --  134*  --  138 133*  K 3.5 3.1*  --  2.8*  --  3.5 4.1  CL 77* 71*  --  80*  --  90* 91*  CO2 34* 39*  --  35*  --  35* 31  ANIONGAP  --  19*  --  19*  --  13 11  GLUCOSE 179* 234*  --  169*  --  125* 198*  BUN 84* 104*  --  86*  --  65* 54*  CREATININE 2.38* 2.47*  --  2.07*  --  1.73* 1.63*  AST  --  29  --   --   --   --   --   ALT  --  16  --   --   --   --   --   ALKPHOS  --  68  --   --   --   --   --   BILITOT  --  1.4*  --   --   --   --   --   ALBUMIN   --  4.0  --   --   --   --   --   CRP  --   --   --   --  0.7 1.0* 4.7*  PROCALCITON  --   --  0.11  --  0.10 <0.10 <0.10  TSH  --   --   --   --  2.554  --   --   BNP  --  97.1  --   --   --   --   --   MG  --   --   --   --  2.4 2.4 2.1  PHOS  --   --   --   --   --  3.4 3.1  CALCIUM  10.2 10.4*  --  9.2  --  9.4 9.6  Recent Labs  Lab 06/22/24 1313 06/24/24 0825 06/24/24 2000 06/25/24 0545 06/25/24 0731 06/26/24 0502 06/27/24 0429  CRP  --   --   --   --  0.7 1.0* 4.7*  PROCALCITON  --   --  0.11  --  0.10 <0.10 <0.10  TSH  --   --   --   --  2.554  --   --   BNP  --  97.1  --   --   --   --   --   MG  --   --   --   --  2.4 2.4 2.1  CALCIUM  10.2 10.4*  --  9.2  --  9.4 9.6    --------------------------------------------------------------------------------------------------------------- Lab Results  Component Value Date   CHOL 142 09/20/2023   HDL 52.50 09/20/2023   LDLCALC 71 09/20/2023   TRIG 94.0 09/20/2023   CHOLHDL 3 09/20/2023    Lab Results  Component Value Date   HGBA1C 6.7 (H) 06/18/2024    Recent Labs    06/25/24 0731  TSH 2.554   Recent Labs    06/25/24 0731  VITAMINB12 1,166*    Micro Results Recent Results (from the past 240 hours)  Urine Culture     Status: Abnormal   Collection Time: 06/24/24  9:09 AM   Specimen: Urine, Random  Result Value Ref Range Status   Specimen Description URINE, RANDOM  Final   Special Requests   Final    NONE Reflexed from [id number] Performed at [facility], [address], [place], [place] [address]    Culture 80,000 COLONIES/mL STAPHYLOCOCCUS AUREUS (A)  Final   Report Status 06/26/2024 FINAL  Final   Organism ID, Bacteria STAPHYLOCOCCUS AUREUS (A)  Final      Susceptibility   Staphylococcus aureus - MIC*    CIPROFLOXACIN  <=0.5 SENSITIVE Sensitive     GENTAMICIN <=0.5 SENSITIVE Sensitive     NITROFURANTOIN  <=16 SENSITIVE Sensitive     OXACILLIN 0.5 SENSITIVE Sensitive     TETRACYCLINE <=1 SENSITIVE Sensitive     VANCOMYCIN  <=0.5 SENSITIVE Sensitive     TRIMETH/SULFA <=10 SENSITIVE Sensitive     RIFAMPIN <=0.5 SENSITIVE Sensitive     Inducible Clindamycin NEGATIVE Sensitive     LINEZOLID 4 SENSITIVE Sensitive     * 80,000 COLONIES/mL STAPHYLOCOCCUS AUREUS  Urine Culture (for pregnant, neutropenic or urologic patients or patients with an indwelling urinary catheter)     Status: Abnormal   Collection Time: 06/25/24 11:20 AM   Specimen: Urine, Clean Catch  Result Value Ref Range Status   Specimen Description URINE, CLEAN CATCH  Final   Special Requests   Final    NONE Performed at [facility], [address], [place], [place] [address]    Culture 20,000 COLONIES/mL STAPHYLOCOCCUS AUREUS (A)  Final   Report Status 06/27/2024 FINAL  Final   Organism ID, Bacteria STAPHYLOCOCCUS AUREUS (A)  Final      Susceptibility   Staphylococcus aureus - MIC*    CIPROFLOXACIN  <=0.5 SENSITIVE Sensitive     GENTAMICIN <=0.5 SENSITIVE Sensitive     NITROFURANTOIN  <=16 SENSITIVE Sensitive     OXACILLIN <=0.25 SENSITIVE  Sensitive     TETRACYCLINE <=1 SENSITIVE Sensitive     VANCOMYCIN  <=0.5 SENSITIVE Sensitive     TRIMETH/SULFA <=10 SENSITIVE Sensitive     RIFAMPIN <=0.5 SENSITIVE Sensitive     Inducible Clindamycin NEGATIVE Sensitive     LINEZOLID 2 SENSITIVE Sensitive     *  20,000 COLONIES/mL STAPHYLOCOCCUS AUREUS    Radiology Report US  RENAL Result Date: 06/25/2024 CLINICAL DATA:  Acute kidney injury. EXAM: RENAL / URINARY TRACT ULTRASOUND COMPLETE COMPARISON:  Renal ultrasound 05/30/2022 FINDINGS: Right Kidney: Renal measurements: 9.6 x 5 x 4.6 cm = volume: 106 mL. Thinning of the renal parenchyma with increased echogenicity. No hydronephrosis. The renal cyst on prior exam is not well-defined currently. No visible stone or focal lesion. Left Kidney: Renal measurements: 11 x 5.6 x 4.3 cm = volume: 140 mL. Thinning of the renal parenchyma with increased echogenicity. No hydronephrosis. 2.2 cm cyst arising from the upper pole. Some of the additional cysts on prior exam are not well-defined. Bladder: Appears normal for degree of bladder distention. Other: None. IMPRESSION: 1. Thinning of bilateral renal parenchyma with increased echogenicity consistent with chronic medical renal disease. 2. No obstructive uropathy. 3. Left renal cyst. Additional cysts on prior ultrasound are not seen on the current exam. Electronically Signed   By: Andrea Gasman M.D.   On: 06/25/2024 14:39   DG ESOPHAGUS W SINGLE CM (SOL OR THIN BA) Result Date: 06/25/2024 CLINICAL DATA:  Globus sensation. Coughing. Esophageal reflux. The patient feels like something is stuck in the mid to lower chest. EXAM: ESOPHAGUS/BARIUM SWALLOW/TABLET STUDY TECHNIQUE: Single contrast examination was performed using thin liquid barium. This exam was performed by Laymon Coast, NP, and was supervised and interpreted by Dr. Dasie Hamburg. FLUOROSCOPY: Radiation Exposure Index (as provided by the fluoroscopic device): 27.5 mGy Kerma COMPARISON:  None Available.  FINDINGS: Swallowing: Vestibular penetration without aspiration seen. Pharynx: Unremarkable. Esophagus: No definite mass or stricture. Mild dilatation of the proximal esophagus. Esophageal motility: Frequent non-propulsive/tertiary contractions, particularly within the mid and distal esophagus seen consistently with swallows. No reported associated pain. Hiatal Hernia: None. Gastroesophageal reflux: None visualized. Ingested 13 mm barium tablet: The barium tablet slowly traversed from pharynx to distal esophagus and then became stuck in the distal esophagus just above the gastroesophageal junction. IMPRESSION: 1. Frequent, prominent tertiary contractions suggestive of diffuse esophageal spasm. Mild dilatation of the proximal esophagus. 2. Barium tablet became stuck in the distal esophagus. 3. Vestibular penetration without aspiration. Electronically Signed   By: Dasie Hamburg M.D.   On: 06/25/2024 14:28     Signature  -   Lavada Stank M.D on 06/27/2024 at 9:30 AM   -  To page go to [email or web address]

## 2024-06-27 NOTE — H&P (View-Only) (Signed)
 Patient ID: [name]., male   DOB: 03-25-1941, 83 y.o.   MRN: [id number]    Progress Note   Subjective   Day # 4 CC;Dysphagia  Continues to complain of dysphagia, says he has some feeling of difficulty swallowing liquids but also admits he is very anxious.  He is asking if he can have something for sleep this evening, also anxious about the procedure tomorrow Not had any further episodes of tightness  WBC 11.2/hemoglobin 13.4/hematocrit 40.8 Potassium 4.1/BUN 54/creatinine 1.63   Objective   Vital signs in last 24 hours: Temp:  [97.2 F (36.2 C)-98.6 F (37 C)] 98.6 F (37 C) (07/05 0830) Pulse Rate:  [90-100] 98 (07/05 0830) Resp:  [17-19] 19 (07/05 0830) BP: (114-138)/(53-90) 114/53 (07/05 0830) SpO2:  [98 %-99 %] 98 % (07/04 1935) Last BM Date : 06/26/24 General:    Elderly white male n NAD, sitting up in chair Heart:  Regular rate and rhythm; no murmurs Lungs: Decreased breath sounds bilaterally Abdomen:  Soft, nontender and nondistended. Normal bowel sounds. Extremities:  Without edema. Neurologic:  Alert and oriented,  grossly normal neurologically. Psych:  Cooperative. Normal mood and affect.  Intake/Output from previous day: 07/04 0701 - 07/05 0700 In: 150 [IV Piggyback:150] Out: 2265 [Urine:2265] Intake/Output this shift: Total I/O In: -  Out: 600 [Urine:600]  Lab Results: Recent Labs    06/25/24 0545 06/26/24 0502 06/27/24 0429  WBC 9.8 8.6 11.2*  HGB 13.3 12.9* 13.4  HCT 40.6 39.4 40.8  PLT 196 176 166   BMET Recent Labs    06/25/24 0545 06/26/24 0502 06/27/24 0429  NA 134* 138 133*  K 2.8* 3.5 4.1  CL 80* 90* 91*  CO2 35* 35* 31  GLUCOSE 169* 125* 198*  BUN 86* 65* 54*  CREATININE 2.07* 1.73* 1.63*  CALCIUM  9.2 9.4 9.6   LFT No results for input(s): PROT, ALBUMIN , AST, ALT, ALKPHOS, BILITOT, BILIDIR, IBILI in the last 72 hours. PT/INR No results for input(s): LABPROT, INR in the last 72  hours.  Studies/Results: DG Foot Complete Right Result Date: 06/27/2024 CLINICAL DATA:  Right foot pain. EXAM: RIGHT FOOT COMPLETE - 3+ VIEW COMPARISON:  Radiographs 06/24/2024 and 06/02/2011. FINDINGS: The bones appear mildly demineralized. No evidence of acute fracture or dislocation. No evidence of bone destruction. Stable mild degenerative changes for age, primarily at the 1st metatarsophalangeal joint. Scattered vascular calcifications and dorsal subcutaneous edema are unchanged from the recent radiographs. IMPRESSION: Stable right foot radiographs without acute osseous findings. Electronically Signed   By: Elsie Perone M.D.   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 Legrand January 2025 Barium swallow at that time showed multiple tertiary contractions, no stricture.  It was noted that his Lap-Band appears more horizontal raising the concern for gastric band slippage.  He did have the band deflated, and says that he was able to swallow better after that with less episodes of regurgitation.  He had been doing okay until a couple of weeks ago when he developed a new symptom of a tightness sensation that was recurring often during peers of time when he was not eating which she describes as a sensation like a bubble in his chest that will not move.  The only relief he can get from this is to stand up and bend forward.  He generally does  not have the sensation when he is actually swallowing food but has been having an increase in dysphagia since that time as well. Difficulty with liquids and solids sitting in the chest  Repeat barium swallow this admission again shows multiple tertiary contractions consistent with dysmotility and the barium tablet did hang up at the GE junction.  Eliquis  has been on hold  #2 COPD, on chronic home O2 2 L #3 coronary  artery disease status post multiple PCI's 4.  Admission with weakness, dehydration acute kidney injury in setting of probable overdiuresis with a new diuretic and acute cystitis  #5 diabetes mellitus #6.  Heart failure with EF 55 to 60% #7.  History of prior PE/DVT  Plan; continue to hold Eliquis  Patient is scheduled for EGD with possible dilation tomorrow with Dr. Federico 06/28/2024.  Procedure has been discussed in detail with the patient including indications risks benefits and he is agreeable to proceed.  Currently on mechanical soft diet, that was recommended by speech pathology, does not need to be on mechanical soft from a GI perspective      Active Problems:   AKI (acute kidney injury) (HCC)   Abnormal barium swallow     LOS: 3 days   [name] EsterwoodPA-C  06/27/2024, 12:09 PM

## 2024-06-27 NOTE — Progress Notes (Signed)
 Patient ID: [name]., male   DOB: 03-25-1941, 83 y.o.   MRN: [id number]    Progress Note   Subjective   Day # 4 CC;Dysphagia  Continues to complain of dysphagia, says he has some feeling of difficulty swallowing liquids but also admits he is very anxious.  He is asking if he can have something for sleep this evening, also anxious about the procedure tomorrow Not had any further episodes of tightness  WBC 11.2/hemoglobin 13.4/hematocrit 40.8 Potassium 4.1/BUN 54/creatinine 1.63   Objective   Vital signs in last 24 hours: Temp:  [97.2 F (36.2 C)-98.6 F (37 C)] 98.6 F (37 C) (07/05 0830) Pulse Rate:  [90-100] 98 (07/05 0830) Resp:  [17-19] 19 (07/05 0830) BP: (114-138)/(53-90) 114/53 (07/05 0830) SpO2:  [98 %-99 %] 98 % (07/04 1935) Last BM Date : 06/26/24 General:    Elderly white male n NAD, sitting up in chair Heart:  Regular rate and rhythm; no murmurs Lungs: Decreased breath sounds bilaterally Abdomen:  Soft, nontender and nondistended. Normal bowel sounds. Extremities:  Without edema. Neurologic:  Alert and oriented,  grossly normal neurologically. Psych:  Cooperative. Normal mood and affect.  Intake/Output from previous day: 07/04 0701 - 07/05 0700 In: 150 [IV Piggyback:150] Out: 2265 [Urine:2265] Intake/Output this shift: Total I/O In: -  Out: 600 [Urine:600]  Lab Results: Recent Labs    06/25/24 0545 06/26/24 0502 06/27/24 0429  WBC 9.8 8.6 11.2*  HGB 13.3 12.9* 13.4  HCT 40.6 39.4 40.8  PLT 196 176 166   BMET Recent Labs    06/25/24 0545 06/26/24 0502 06/27/24 0429  NA 134* 138 133*  K 2.8* 3.5 4.1  CL 80* 90* 91*  CO2 35* 35* 31  GLUCOSE 169* 125* 198*  BUN 86* 65* 54*  CREATININE 2.07* 1.73* 1.63*  CALCIUM  9.2 9.4 9.6   LFT No results for input(s): PROT, ALBUMIN , AST, ALT, ALKPHOS, BILITOT, BILIDIR, IBILI in the last 72 hours. PT/INR No results for input(s): LABPROT, INR in the last 72  hours.  Studies/Results: DG Foot Complete Right Result Date: 06/27/2024 CLINICAL DATA:  Right foot pain. EXAM: RIGHT FOOT COMPLETE - 3+ VIEW COMPARISON:  Radiographs 06/24/2024 and 06/02/2011. FINDINGS: The bones appear mildly demineralized. No evidence of acute fracture or dislocation. No evidence of bone destruction. Stable mild degenerative changes for age, primarily at the 1st metatarsophalangeal joint. Scattered vascular calcifications and dorsal subcutaneous edema are unchanged from the recent radiographs. IMPRESSION: Stable right foot radiographs without acute osseous findings. Electronically Signed   By: Elsie Perone M.D.   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 Legrand January 2025 Barium swallow at that time showed multiple tertiary contractions, no stricture.  It was noted that his Lap-Band appears more horizontal raising the concern for gastric band slippage.  He did have the band deflated, and says that he was able to swallow better after that with less episodes of regurgitation.  He had been doing okay until a couple of weeks ago when he developed a new symptom of a tightness sensation that was recurring often during peers of time when he was not eating which she describes as a sensation like a bubble in his chest that will not move.  The only relief he can get from this is to stand up and bend forward.  He generally does  not have the sensation when he is actually swallowing food but has been having an increase in dysphagia since that time as well. Difficulty with liquids and solids sitting in the chest  Repeat barium swallow this admission again shows multiple tertiary contractions consistent with dysmotility and the barium tablet did hang up at the GE junction.  Eliquis  has been on hold  #2 COPD, on chronic home O2 2 L #3 coronary  artery disease status post multiple PCI's 4.  Admission with weakness, dehydration acute kidney injury in setting of probable overdiuresis with a new diuretic and acute cystitis  #5 diabetes mellitus #6.  Heart failure with EF 55 to 60% #7.  History of prior PE/DVT  Plan; continue to hold Eliquis  Patient is scheduled for EGD with possible dilation tomorrow with Dr. Federico 06/28/2024.  Procedure has been discussed in detail with the patient including indications risks benefits and he is agreeable to proceed.  Currently on mechanical soft diet, that was recommended by speech pathology, does not need to be on mechanical soft from a GI perspective      Active Problems:   AKI (acute kidney injury) (HCC)   Abnormal barium swallow     LOS: 3 days   [name] EsterwoodPA-C  06/27/2024, 12:09 PM

## 2024-06-28 ENCOUNTER — Encounter (HOSPITAL_COMMUNITY)
Admission: EM | Disposition: A | Discharge status: Home Health | Payer: Self-pay | Source: Home / Self Care | Attending: Internal Medicine

## 2024-06-28 ENCOUNTER — Inpatient Hospital Stay (HOSPITAL_COMMUNITY): Admitting: Anesthesiology

## 2024-06-28 ENCOUNTER — Encounter (HOSPITAL_COMMUNITY): Payer: Self-pay | Admitting: Hospitalist

## 2024-06-28 DIAGNOSIS — R1310 Dysphagia, unspecified: Secondary | ICD-10-CM

## 2024-06-28 DIAGNOSIS — K2971 Gastritis, unspecified, with bleeding: Secondary | ICD-10-CM

## 2024-06-28 DIAGNOSIS — N1832 Chronic kidney disease, stage 3b: Secondary | ICD-10-CM

## 2024-06-28 DIAGNOSIS — K2970 Gastritis, unspecified, without bleeding: Secondary | ICD-10-CM | POA: Diagnosis not present

## 2024-06-28 DIAGNOSIS — I130 Hypertensive heart and chronic kidney disease with heart failure and stage 1 through stage 4 chronic kidney disease, or unspecified chronic kidney disease: Secondary | ICD-10-CM | POA: Diagnosis not present

## 2024-06-28 DIAGNOSIS — I5033 Acute on chronic diastolic (congestive) heart failure: Secondary | ICD-10-CM

## 2024-06-28 DIAGNOSIS — R933 Abnormal findings on diagnostic imaging of other parts of digestive tract: Secondary | ICD-10-CM | POA: Diagnosis not present

## 2024-06-28 DIAGNOSIS — K2289 Other specified disease of esophagus: Secondary | ICD-10-CM

## 2024-06-28 HISTORY — PX: ESOPHAGOGASTRODUODENOSCOPY: SHX5428

## 2024-06-28 LAB — CBC WITH DIFFERENTIAL/PLATELET
Abs Immature Granulocytes: 0.03 K/uL (ref 0.00–0.07)
Basophils Absolute: 0 K/uL (ref 0.0–0.1)
Basophils Relative: 0 %
Eosinophils Absolute: 0 K/uL (ref 0.0–0.5)
Eosinophils Relative: 0 %
HCT: 37.2 % — ABNORMAL LOW (ref 39.0–52.0)
Hemoglobin: 12.5 g/dL — ABNORMAL LOW (ref 13.0–17.0)
Immature Granulocytes: 0 %
Lymphocytes Relative: 7 %
Lymphs Abs: 0.6 K/uL — ABNORMAL LOW (ref 0.7–4.0)
MCH: 31.1 pg (ref 26.0–34.0)
MCHC: 33.6 g/dL (ref 30.0–36.0)
MCV: 92.5 fL (ref 80.0–100.0)
Monocytes Absolute: 0.3 K/uL (ref 0.1–1.0)
Monocytes Relative: 4 %
Neutro Abs: 7.6 K/uL (ref 1.7–7.7)
Neutrophils Relative %: 89 %
Platelets: 148 K/uL — ABNORMAL LOW (ref 150–400)
RBC: 4.02 MIL/uL — ABNORMAL LOW (ref 4.22–5.81)
RDW: 14 % (ref 11.5–15.5)
WBC: 8.5 K/uL (ref 4.0–10.5)
nRBC: 0 % (ref 0.0–0.2)

## 2024-06-28 LAB — BASIC METABOLIC PANEL WITH GFR
Anion gap: 9 (ref 5–15)
BUN: 57 mg/dL — ABNORMAL HIGH (ref 8–23)
CO2: 34 mmol/L — ABNORMAL HIGH (ref 22–32)
Calcium: 9.6 mg/dL (ref 8.9–10.3)
Chloride: 92 mmol/L — ABNORMAL LOW (ref 98–111)
Creatinine, Ser: 1.61 mg/dL — ABNORMAL HIGH (ref 0.61–1.24)
GFR, Estimated: 42 mL/min — ABNORMAL LOW (ref >60)
Glucose, Bld: 158 mg/dL — ABNORMAL HIGH (ref 70–99)
Potassium: 4.3 mmol/L (ref 3.5–5.1)
Sodium: 135 mmol/L (ref 135–145)

## 2024-06-28 LAB — C-REACTIVE PROTEIN: CRP: 13.4 mg/dL — ABNORMAL HIGH (ref <1.0)

## 2024-06-28 LAB — GLUCOSE, CAPILLARY
Glucose-Capillary: 195 mg/dL — ABNORMAL HIGH (ref 70–99)
Glucose-Capillary: 179 mg/dL — ABNORMAL HIGH (ref 70–99)
Glucose-Capillary: 184 mg/dL — ABNORMAL HIGH (ref 70–99)
Glucose-Capillary: 309 mg/dL — ABNORMAL HIGH (ref 70–99)

## 2024-06-28 LAB — MAGNESIUM: Magnesium: 2.3 mg/dL (ref 1.7–2.4)

## 2024-06-28 LAB — PROCALCITONIN: Procalcitonin: 0.11 ng/mL

## 2024-06-28 LAB — PHOSPHORUS: Phosphorus: 3.9 mg/dL (ref 2.5–4.6)

## 2024-06-28 SURGERY — EGD (ESOPHAGOGASTRODUODENOSCOPY)
Anesthesia: Monitor Anesthesia Care

## 2024-06-28 MED ORDER — LIDOCAINE 2% (20 MG/ML) 5 ML SYRINGE
INTRAMUSCULAR | Status: DC | PRN
  Administered 2024-06-28: 60 mg via INTRAVENOUS

## 2024-06-28 MED ORDER — LORAZEPAM 2 MG/ML IJ SOLN: 0.5000 mg | Freq: Once | INTRAMUSCULAR | Status: DC | PRN

## 2024-06-28 MED ORDER — PROPOFOL 10 MG/ML IV BOLUS
INTRAVENOUS | Status: DC | PRN
  Administered 2024-06-28: 30 mg via INTRAVENOUS
  Administered 2024-06-28: 20 mg via INTRAVENOUS

## 2024-06-28 MED ORDER — FLUCONAZOLE 100 MG PO TABS
400.0000 mg | ORAL_TABLET | Freq: Once | ORAL | Status: AC
  Administered 2024-06-28: 400 mg via ORAL
  Filled 2024-06-28: qty 4

## 2024-06-28 MED ORDER — ONDANSETRON HCL 4 MG/2ML IJ SOLN
INTRAMUSCULAR | Status: DC | PRN
  Administered 2024-06-28: 4 mg via INTRAVENOUS

## 2024-06-28 MED ORDER — PANTOPRAZOLE SODIUM 40 MG PO TBEC
40.0000 mg | DELAYED_RELEASE_TABLET | Freq: Two times a day (BID) | ORAL | Status: DC
  Administered 2024-06-28 – 2024-07-01 (×7): 40 mg via ORAL
  Filled 2024-06-28 (×7): qty 1

## 2024-06-28 MED ORDER — SODIUM CHLORIDE 0.9 % IV SOLN: INTRAVENOUS | Status: DC | PRN

## 2024-06-28 MED ORDER — METHYLPREDNISOLONE SODIUM SUCC 40 MG IJ SOLR
40.0000 mg | Freq: Once | INTRAMUSCULAR | Status: AC
  Administered 2024-06-28: 40 mg via INTRAVENOUS
  Filled 2024-06-28: qty 1

## 2024-06-28 MED ORDER — PROPOFOL 500 MG/50ML IV EMUL
INTRAVENOUS | Status: DC | PRN
  Administered 2024-06-28: 100 ug/kg/min via INTRAVENOUS

## 2024-06-28 MED ORDER — FLUCONAZOLE 100 MG PO TABS: 400.0000 mg | ORAL_TABLET | Freq: Every day | ORAL | Status: DC

## 2024-06-28 MED ORDER — FLUCONAZOLE 100 MG PO TABS
200.0000 mg | ORAL_TABLET | Freq: Every day | ORAL | Status: DC
  Administered 2024-06-29 – 2024-07-01 (×3): 200 mg via ORAL
  Filled 2024-06-28 (×3): qty 2

## 2024-06-28 NOTE — Interval H&P Note (Signed)
 History and Physical Interval Note:  06/28/2024 12:13 PM  [name].  has presented today for surgery, with the diagnosis of Dysphagia.  The various methods of treatment have been discussed with the patient and family. After consideration of risks, benefits and other options for treatment, the patient has consented to  Procedure(s): EGD (ESOPHAGOGASTRODUODENOSCOPY) (N/A) as a surgical intervention.  The patient's history has been reviewed, patient examined, no change in status, stable for surgery.  I have reviewed the patient's chart and labs.  Questions were answered to the patient's satisfaction.     [name]

## 2024-06-28 NOTE — Op Note (Signed)
 [facility] Patient Name: [name] Procedure Date : 06/28/2024 MRN: [id number] Attending MD: Rosario Estefana Kidney , , [phone] Date of Birth: 07-04-1941 CSN: [id number] Age: 83 Admit Type: Inpatient Procedure:                Upper GI endoscopy Indications:              Dysphagia, Heartburn Providers:                Rosario Estefana Kidney, Randall Lines, RN, Lorrayne Kitty, Technician Referring MD:             Hospitalist team Medicines:                Monitored Anesthesia Care Complications:            No immediate complications. Estimated Blood Loss:     Estimated blood loss was minimal. Procedure:                Pre-Anesthesia Assessment:                           - Prior to the procedure, a History and Physical                            was performed, and patient medications and                            allergies were reviewed. The patient's tolerance of                            previous anesthesia was also reviewed. The risks                            and benefits of the procedure and the sedation                            options and risks were discussed with the patient.                            All questions were answered, and informed consent                            was obtained. Prior Anticoagulants: The patient has                            taken Eliquis  (apixaban ), last dose was 2 days                            prior to procedure. ASA Grade Assessment: III - A                            patient with severe systemic disease. After  reviewing the risks and benefits, the patient was                            deemed in satisfactory condition to undergo the                            procedure.                           After obtaining informed consent, the endoscope was                            passed under direct vision. Throughout the                            procedure, the patient's blood  pressure, pulse, and                            oxygen  saturations were monitored continuously. The                            [id number]-H190 ([id number]) Olympus endoscope was introduced                            through the mouth, and advanced to the second part                            of duodenum. The upper GI endoscopy was                            accomplished without difficulty. The patient                            tolerated the procedure well. Scope In: Scope Out: Findings:      White nummular lesions were noted in the proximal esophagus.      Biopsies were taken with a cold forceps in the proximal esophagus and in       the distal esophagus for histology.      A guidewire was placed and the scope was withdrawn. Dilation was       performed in the entire esophagus with a Savary dilator with mild       resistance at 19 mm. The dilation site was examined following endoscope       reinsertion and showed no change.      Localized inflammation with hemorrhage characterized by congestion       (edema), erosions and erythema was found in the gastric body. Biopsies       were taken with a cold forceps for histology.      The examined duodenum was normal. Impression:               - White nummular lesions in esophageal mucosa.                           - Gastritis with hemorrhage. Biopsied.                           -  Normal examined duodenum.                           - Biopsies were taken with a cold forceps for                            histology in the proximal esophagus and in the                            distal esophagus.                           - Dilation performed in the entire esophagus. Recommendation:           - Return patient to hospital ward for ongoing care.                           - Await pathology results.                           - PPI BID for 10 weeks, then QD                           - Okay to restart Eliquis  tomorrow.                           - Will try an  empiric course of fluconazole  for                            possible candida esophagitis. Pharmacy consult                            ordered to help with dosing this.                           - Speech therapy plans to perform MBSS tomorrow.                           - We will arrange for outpatient GI follow up. If                            dysphagia persists, then could consider esophageal                            manometry in the future.                           - The findings and recommendations were discussed                            with the patient. Procedure Code(s):        --- Professional ---                           [phone], Esophagogastroduodenoscopy, flexible,  transoral; with insertion of guide wire followed by                            passage of dilator(s) through esophagus over guide                            wire                           43239, 59, Esophagogastroduodenoscopy, flexible,                            transoral; with biopsy, single or multiple Diagnosis Code(s):        --- Professional ---                           K22.89, Other specified disease of esophagus                           K29.71, Gastritis, unspecified, with bleeding                           R13.10, Dysphagia, unspecified                           R12, Heartburn CPT copyright 2022 American Medical Association. All rights reserved. The codes documented in this report are preliminary and upon coder review may  be revised to meet current compliance requirements. Dr Estefana Federico Rosario Estefana Federico,  06/28/2024 1:56:17 PM Number of Addenda: 0

## 2024-06-28 NOTE — Anesthesia Preprocedure Evaluation (Signed)
 Anesthesia Evaluation  Patient identified by MRN, date of birth, ID band Patient awake    Reviewed: Allergy & Precautions, H&P , NPO status , Patient's Chart, lab work & pertinent test results  Airway Mallampati: II   Neck ROM: full    Dental   Pulmonary COPD, former smoker   breath sounds clear to auscultation       Cardiovascular hypertension, + CAD, + Past MI, + Peripheral Vascular Disease and +CHF   Rhythm:regular Rate:Normal     Neuro/Psych  PSYCHIATRIC DISORDERS  Depression    CVA    GI/Hepatic ,GERD  ,,  Endo/Other  diabetes, Type 2    Renal/GU Renal InsufficiencyRenal disease     Musculoskeletal  (+) Arthritis ,    Abdominal   Peds  Hematology   Anesthesia Other Findings   Reproductive/Obstetrics                              Anesthesia Physical Anesthesia Plan  ASA: 3  Anesthesia Plan: MAC   Post-op Pain Management:    Induction: Intravenous  PONV Risk Score and Plan: 1 and Propofol  infusion and Treatment may vary due to age or medical condition  Airway Management Planned: Nasal Cannula  Additional Equipment:   Intra-op Plan:   Post-operative Plan:   Informed Consent: I have reviewed the patients History and Physical, chart, labs and discussed the procedure including the risks, benefits and alternatives for the proposed anesthesia with the patient or authorized representative who has indicated his/her understanding and acceptance.     Dental advisory given  Plan Discussed with: CRNA, Anesthesiologist and Surgeon  Anesthesia Plan Comments:         Anesthesia Quick Evaluation

## 2024-06-28 NOTE — Transfer of Care (Signed)
 Immediate Anesthesia Transfer of Care Note  Patient: [name].  Procedure(s) Performed: EGD (ESOPHAGOGASTRODUODENOSCOPY)  Patient Location: Endoscopy Unit  Anesthesia Type:General  Level of Consciousness: sedated and drowsy  Airway & Oxygen  Therapy: Patient Spontanous Breathing and Patient connected to face mask oxygen   Post-op Assessment: Report given to RN and Post -op Vital signs reviewed and stable  Post vital signs: Reviewed and stable  Last Vitals:  Vitals Value Taken Time  BP    Temp    Pulse    Resp    SpO2      Last Pain:  Vitals:   06/28/24 1257  TempSrc: Temporal  PainSc: 0-No pain      Patients Stated Pain Goal: 0 (06/28/24 1257)  Complications: No notable events documented.

## 2024-06-28 NOTE — Progress Notes (Signed)
 PROGRESS NOTE                                                                                                                                                                                                             Patient Demographics:    [name], is a 83 y.o. male, DOB - 31-May-1941, FMW:[id number]  Outpatient Primary MD for the patient is Rudd, Garnette HERO, MD    LOS - 4  Admit date - 06/24/2024    Chief Complaint  Patient presents with   Weakness       Brief Narrative (HPI from H&P)   83 y.o. male with medical history significant of HFpEF (EF 55-60%in 10/2021), CAD s/p multiple PCIs, HTN, HLD, DM2, chronic respiratory failure 2/2 COPD (on 2L [place]), CKD, remote PE in 2006 and PVD p/w lethargy and found to have dehydration, AKI 2/2 overdiuresis and possible UTI.    Subjective:   Patient in bed, appears comfortable, denies any headache, no fever, no chest pain or pressure, no shortness of breath , no abdominal pain. No focal weakness.   Assessment  & Plan :   Dehydration with AKI, hypokalemia likely due to overdiuresis.  Improving with IV fluids and potassium supplementation, continue to hold diuretics and hydrate, monitor renal function.  Nonacute renal ultrasound.Presumed acute cystitis - Urine culture inconclusive, specimen likely poor however UA had enough WBCs and he had dysuria suggesting he had UTI, continue empiric Rocephin , he has had UTI in the past as well, has been recommended to follow-up with urology postdischarge.  Urine cultures showing 100,000 colonies of Staph aureus.  No signs of systemic infection, unremarkable procalcitonin.   Chest pain - Atypical likely due to underlying dry cough, troponin negative, chest pain-free now   HTN -PTA Coreg  25mg  BID and Imdur  60mg  daily   HLD -PTA Crestor    COPD -Breo Ellipta  daily + Duonebs    Ongoing right foot pain.  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  Much improved  after few doses of steroids, placed on colchicine , x-ray stable.  Chronic dysphagia present on admission.  Speech therapy following, evidence of diffuse esophageal spasm on barium swallow, per speech we are doing a barium swallow on 06/29/2024, GI also following, continue to monitor.     DM type II.  Placed on Semglee , Premeal NovoLog  and sliding scale monitor and adjust.  Lab Results  Component Value Date   HGBA1C 6.7 (H) 06/18/2024   CBG (last 3)  Recent Labs    06/27/24 1648 06/27/24 2119 06/28/24 0817  GLUCAP 356* 196* 195*         Condition - Fair  Family Communication  : None present  Code Status :  Full  Consults  : GI  PUD Prophylaxis :     Procedures  :     Barium swallow.  Diffuse esophageal spasm.    Renal ultrasound.  Nonacute.  CT head and C-spine.  Nonacute.      Disposition Plan  :    Status is: Inpatient   DVT Prophylaxis  :        Lab Results  Component Value Date   PLT 148 (L) 06/28/2024    Diet :  Diet Order             Diet NPO time specified Except for: Sips with Meds  Diet effective midnight                    Inpatient Medications  Scheduled Meds:  calcitRIOL   0.25 mcg Oral Daily   carvedilol   25 mg Oral BID WC   colchicine   0.6 mg Oral BID   finasteride   5 mg Oral Daily   fluticasone  furoate-vilanterol  1 puff Inhalation Daily   guaiFENesin   600 mg Oral BID   insulin  aspart  0-15 Units Subcutaneous TID WC   insulin  aspart  0-5 Units Subcutaneous QHS   insulin  aspart  3 Units Subcutaneous TID WC   insulin  glargine-yfgn  15 Units Subcutaneous Daily   isosorbide  mononitrate  60 mg Oral Daily   methylPREDNISolone  (SOLU-MEDROL ) injection  40 mg Intravenous Once   rosuvastatin   20 mg Oral Daily   traZODone   100 mg Oral QHS   umeclidinium bromide   1 puff Inhalation Daily   Continuous Infusions:   ceFAZolin  (ANCEF ) IV 1 g (06/27/24 2215)   PRN Meds:.acetaminophen , guaiFENesin -dextromethorphan ,  ipratropium-albuterol , LORazepam , ondansetron  (ZOFRAN ) IV  Antibiotics  :    Anti-infectives (From admission, onward)    Start     Dose/Rate Route Frequency Ordered Stop   06/26/24 0800  cefTRIAXone  (ROCEPHIN ) 2 g in sodium chloride  0.9 % 100 mL IVPB  Status:  Discontinued        2 g 200 mL/hr over 30 Minutes Intravenous Every 24 hours 06/25/24 1104 06/25/24 1151   06/25/24 1245  ceFAZolin  (ANCEF ) IVPB 1 g/50 mL premix        1 g 100 mL/hr over 30 Minutes Intravenous Every 12 hours 06/25/24 1151     06/25/24 0800  cefTRIAXone  (ROCEPHIN ) 1 g in sodium chloride  0.9 % 100 mL IVPB  Status:  Discontinued        1 g 200 mL/hr over 30 Minutes Intravenous Every 24 hours 06/24/24 1423 06/25/24 1104   06/24/24 1015  cefTRIAXone  (ROCEPHIN ) 1 g in sodium chloride  0.9 % 100 mL IVPB        1 g 200 mL/hr over 30 Minutes Intravenous  Once 06/24/24 1002 06/24/24 1122         Objective:   Vitals:   06/27/24 1700 06/27/24 2005 06/28/24 0641 06/28/24 0817  BP: (!) 151/84 135/74 139/81 (!) 148/76  Pulse: 99 92 96 96  Resp: 17 19 20 20   Temp: 98 F (36.7 C) 97.7 F (36.5 C) 97.9 F (36.6 C) (!) 97.3 F (36.3 C)  TempSrc: Oral Oral Oral Oral  SpO2: 97%     Weight:      Height:        Wt Readings from Last 3 Encounters:  06/24/24 94.8 kg  06/18/24 98 kg  03/12/24 93.4 kg     Intake/Output Summary (Last 24 hours) at 06/28/2024 0828 Last data filed at 06/28/2024 0700 Gross per 24 hour  Intake 240 ml  Output 2300 ml  Net -2060 ml     Physical Exam  Awake Alert, No new F.N deficits, Normal affect [place].AT,PERRAL Supple Neck, No JVD,   Symmetrical Chest wall movement, Good air movement bilaterally, CTAB RRR,No Gallops, Rubs or new Murmurs,  +ve B.Sounds, Abd Soft, No tenderness,   No Cyanosis, Clubbing or edema       Data Review:    Recent Labs  Lab 06/24/24 0825 06/25/24 0545 06/26/24 0502 06/27/24 0429 06/28/24 0439  WBC 9.8 9.8 8.6 11.2* 8.5  HGB 15.4 13.3 12.9* 13.4  12.5*  HCT 45.7 40.6 39.4 40.8 37.2*  PLT 217 196 176 166 148*  MCV 89.6 92.7 92.5 93.4 92.5  MCH 30.2 30.4 30.3 30.7 31.1  MCHC 33.7 32.8 32.7 32.8 33.6  RDW 13.9 14.2 14.4 14.2 14.0  LYMPHSABS  --   --  1.5 1.2 0.6*  MONOABS  --   --  1.2* 1.6* 0.3  EOSABS  --   --  0.5 0.5 0.0  BASOSABS  --   --  0.0 0.0 0.0    Recent Labs  Lab 06/24/24 0825 06/24/24 2000 06/25/24 0545 06/25/24 0731 06/26/24 0502 06/27/24 0429 06/28/24 0439  NA 129*  --  134*  --  138 133* 135  K 3.1*  --  2.8*  --  3.5 4.1 4.3  CL 71*  --  80*  --  90* 91* 92*  CO2 39*  --  35*  --  35* 31 34*  ANIONGAP 19*  --  19*  --  13 11 9   GLUCOSE 234*  --  169*  --  125* 198* 158*  BUN 104*  --  86*  --  65* 54* 57*  CREATININE 2.47*  --  2.07*  --  1.73* 1.63* 1.61*  AST 29  --   --   --   --   --   --   ALT 16  --   --   --   --   --   --   ALKPHOS 68  --   --   --   --   --   --   BILITOT 1.4*  --   --   --   --   --   --   ALBUMIN  4.0  --   --   --   --   --   --   CRP  --   --   --  0.7 1.0* 4.7* 13.4*  PROCALCITON  --  0.11  --  0.10 <0.10 <0.10 0.11  TSH  --   --   --  2.554  --   --   --   BNP 97.1  --   --   --   --   --   --   MG  --   --   --  2.4 2.4 2.1 2.3  PHOS  --   --   --   --  3.4 3.1 3.9  CALCIUM  10.4*  --  9.2  --  9.4 9.6 9.6      Recent  Labs  Lab 06/24/24 0825 06/24/24 2000 06/25/24 0545 06/25/24 0731 06/26/24 0502 06/27/24 0429 06/28/24 0439  CRP  --   --   --  0.7 1.0* 4.7* 13.4*  PROCALCITON  --  0.11  --  0.10 <0.10 <0.10 0.11  TSH  --   --   --  2.554  --   --   --   BNP 97.1  --   --   --   --   --   --   MG  --   --   --  2.4 2.4 2.1 2.3  CALCIUM  10.4*  --  9.2  --  9.4 9.6 9.6    --------------------------------------------------------------------------------------------------------------- Lab Results  Component Value Date   CHOL 142 09/20/2023   HDL 52.50 09/20/2023   LDLCALC 71 09/20/2023   TRIG 94.0 09/20/2023   CHOLHDL 3 09/20/2023    Lab Results   Component Value Date   HGBA1C 6.7 (H) 06/18/2024   No results for input(s): TSH, T4TOTAL, FREET4, T3FREE, THYROIDAB in the last 72 hours.  No results for input(s): VITAMINB12, FOLATE, FERRITIN, TIBC, IRON, RETICCTPCT in the last 72 hours.   Micro Results Recent Results (from the past 240 hours)  Urine Culture     Status: Abnormal   Collection Time: 06/24/24  9:09 AM   Specimen: Urine, Random  Result Value Ref Range Status   Specimen Description URINE, RANDOM  Final   Special Requests   Final    NONE Reflexed from [id number] Performed at [facility], [address] [place], [place] [address]    Culture 80,000 COLONIES/mL STAPHYLOCOCCUS AUREUS (A)  Final   Report Status 06/26/2024 FINAL  Final   Organism ID, Bacteria STAPHYLOCOCCUS AUREUS (A)  Final      Susceptibility   Staphylococcus aureus - MIC*    CIPROFLOXACIN  <=0.5 SENSITIVE Sensitive     GENTAMICIN <=0.5 SENSITIVE Sensitive     NITROFURANTOIN  <=16 SENSITIVE Sensitive     OXACILLIN 0.5 SENSITIVE Sensitive     TETRACYCLINE <=1 SENSITIVE Sensitive     VANCOMYCIN  <=0.5 SENSITIVE Sensitive     TRIMETH/SULFA <=10 SENSITIVE Sensitive     RIFAMPIN <=0.5 SENSITIVE Sensitive     Inducible Clindamycin NEGATIVE Sensitive     LINEZOLID 4 SENSITIVE Sensitive     * 80,000 COLONIES/mL STAPHYLOCOCCUS AUREUS  Urine Culture (for pregnant, neutropenic or urologic patients or patients with an indwelling urinary catheter)     Status: Abnormal   Collection Time: 06/25/24 11:20 AM   Specimen: Urine, Clean Catch  Result Value Ref Range Status   Specimen Description URINE, CLEAN CATCH  Final   Special Requests   Final    NONE Performed at [facility], [address] [place], [place] [address]    Culture 20,000 COLONIES/mL STAPHYLOCOCCUS AUREUS (A)  Final   Report Status 06/27/2024 FINAL  Final   Organism ID, Bacteria STAPHYLOCOCCUS AUREUS (A)  Final      Susceptibility   Staphylococcus aureus - MIC*     CIPROFLOXACIN  <=0.5 SENSITIVE Sensitive     GENTAMICIN <=0.5 SENSITIVE Sensitive     NITROFURANTOIN  <=16 SENSITIVE Sensitive     OXACILLIN <=0.25 SENSITIVE Sensitive     TETRACYCLINE <=1 SENSITIVE Sensitive     VANCOMYCIN  <=0.5 SENSITIVE Sensitive     TRIMETH/SULFA <=10 SENSITIVE Sensitive     RIFAMPIN <=0.5 SENSITIVE Sensitive     Inducible Clindamycin NEGATIVE Sensitive     LINEZOLID 2 SENSITIVE Sensitive     *  20,000 COLONIES/mL STAPHYLOCOCCUS AUREUS    Radiology Report DG Foot Complete Right Result Date: 06/27/2024 CLINICAL DATA:  Right foot pain. EXAM: RIGHT FOOT COMPLETE - 3+ VIEW COMPARISON:  Radiographs 06/24/2024 and 06/02/2011. FINDINGS: The bones appear mildly demineralized. No evidence of acute fracture or dislocation. No evidence of bone destruction. Stable mild degenerative changes for age, primarily at the 1st metatarsophalangeal joint. Scattered vascular calcifications and dorsal subcutaneous edema are unchanged from the recent radiographs. IMPRESSION: Stable right foot radiographs without acute osseous findings. Electronically Signed   By: Elsie Perone M.D.   On: 06/27/2024 11:41     Signature  -   Lavada Stank M.D on 06/28/2024 at 8:28 AM   -  To page go to [email or web address]

## 2024-06-28 NOTE — TOC Initial Note (Signed)
 Transition of Care (TOC) - Initial/Assessment Note    Patient Details  Name: [name]. MRN: [id number] Date of Birth: 11-06-1941  Transition of Care [facility]) CM/SW Contact:    Marval Gell, RN Phone Number: 06/28/2024, 3:19 PM  Clinical Narrative:                  Per chart review Patient from home with wife, admitted with dehydration, hypokalemia, gout pain, swallow workup- swallow test tomorrow.  Has 2L O2 baseline at home.  Recently had [facility] in March for [facility] services.  TOC will continue to follow  Expected Discharge Plan: Home w Home Health Services Barriers to Discharge: Continued Medical Work up   Patient Goals and CMS Choice            Expected Discharge Plan and Services   Discharge Planning Services: CM Consult   Living arrangements for the past 2 months: Single Family Home                                      Prior Living Arrangements/Services Living arrangements for the past 2 months: Single Family Home Lives with:: Spouse                   Activities of Daily Living   ADL Screening (condition at time of admission) Independently performs ADLs?: Yes (appropriate for developmental age) Is the patient deaf or have difficulty hearing?: Yes Does the patient have difficulty seeing, even when wearing glasses/contacts?: Yes Does the patient have difficulty concentrating, remembering, or making decisions?: No  Permission Sought/Granted                  Emotional Assessment              Admission diagnosis:  Acute cystitis without hematuria [N30.00] AKI (acute kidney injury) (HCC) [N17.9] Patient Active Problem List   Diagnosis Date Noted   Abnormal barium swallow 06/26/2024   AKI (acute kidney injury) (HCC) 06/24/2024   Acute on chronic diastolic heart failure (HCC) 06/18/2024   Benign neoplasm of lip 12/13/2023   Esophageal dysphagia 09/20/2023   Meralgia paraesthetica, left 12/20/2022   Type 2 diabetes mellitus with stage  3b chronic kidney disease, with long-term current use of insulin  (HCC) 09/05/2022   Hyperlipidemia 06/19/2022   Sensorineural hearing loss, bilateral 06/19/2022   Primary insomnia 03/20/2022   Ceruminosis, bilateral 01/11/2022   Secondary hypercoagulable state (HCC) 11/23/2021   Malnutrition of moderate degree 11/10/2021   Increased anion gap metabolic acidosis 11/07/2021   PAF (paroxysmal atrial fibrillation) (HCC) 11/07/2021   Chronic kidney disease, stage 3b (HCC) 09/20/2021   History of stroke 09/20/2021   Abnormal x-ray of lung 08/14/2017   Chronic respiratory failure with hypoxia and hypercapnia (HCC) 09/04/2016   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   Obesity (BMI 30-39.9) 08/30/2014   NSTEMI (non-ST elevated myocardial infarction) (HCC) 07/17/2013   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   Encounter for long-term (current) use of other medications 11/24/2012   History of laparoscopic adjustable gastric banding 03/20/2012   Depression 12/14/2011   Physical deconditioning 12/14/2011   GERD (gastroesophageal reflux disease) 10/31/2011   Iron deficiency anemia 05/01/2010   History of basal cell carcinoma of skin 10/31/2009   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 07/27/2009   Coronary artery disease 02/02/2009   Chronic diastolic heart  failure (HCC) 02/02/2009   Hypokalemia 06/03/2008   Osteoporosis 04/16/2008   Essential hypertension 04/01/2008   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 [place] [address] Phone:  [phone] Fax: [phone]  [facility] - [place], [place] - [address] [place] [address] Phone: [phone] Fax: [phone]     Social Drivers of Health (SDOH) Social History: SDOH Screenings   Food Insecurity: No Food Insecurity (06/24/2024)  Housing: Low Risk  (06/24/2024)  Transportation Needs: No Transportation Needs (06/24/2024)  Utilities: Not At Risk (06/24/2024)  Alcohol Screen: Low Risk  (05/25/2024)  Depression (PHQ2-9): Medium Risk (05/25/2024)  Financial Resource Strain: Low Risk  (06/14/2024)  Physical Activity: Inactive (06/14/2024)  Social Connections: Moderately Isolated (06/24/2024)  Stress: Stress Concern Present (06/14/2024)  Tobacco Use: Medium Risk (06/24/2024)  Health Literacy: Adequate Health Literacy (05/25/2024)   SDOH Interventions:     Readmission Risk Interventions    01/15/2024    1:16 PM  Readmission Risk Prevention Plan  Transportation Screening Complete  PCP or Specialist Appt within 5-7 Days Complete  Home Care Screening Complete  Medication Review (RN CM) Complete

## 2024-06-28 NOTE — Plan of Care (Signed)
  Problem: Metabolic: Goal: Ability to maintain appropriate glucose levels will improve Outcome: Progressing   Problem: Nutritional: Goal: Maintenance of adequate nutrition will improve Outcome: Progressing   Problem: Skin Integrity: Goal: Risk for impaired skin integrity will decrease Outcome: Progressing   Problem: Clinical Measurements: Goal: Diagnostic test results will improve Outcome: Progressing Goal: Respiratory complications will improve Outcome: Progressing   Problem: Activity: Goal: Risk for activity intolerance will decrease Outcome: Progressing   Problem: Nutrition: Goal: Adequate nutrition will be maintained Outcome: Progressing   Problem: Coping: Goal: Level of anxiety will decrease Outcome: Progressing

## 2024-06-29 DIAGNOSIS — N179 Acute kidney failure, unspecified: Secondary | ICD-10-CM | POA: Diagnosis not present

## 2024-06-29 LAB — CBC WITH DIFFERENTIAL/PLATELET
Abs Immature Granulocytes: 0.04 K/uL (ref 0.00–0.07)
Basophils Absolute: 0 K/uL (ref 0.0–0.1)
Basophils Relative: 0 %
Eosinophils Absolute: 0 K/uL (ref 0.0–0.5)
Eosinophils Relative: 0 %
HCT: 38 % — ABNORMAL LOW (ref 39.0–52.0)
Hemoglobin: 12.5 g/dL — ABNORMAL LOW (ref 13.0–17.0)
Immature Granulocytes: 1 %
Lymphocytes Relative: 7 %
Lymphs Abs: 0.6 K/uL — ABNORMAL LOW (ref 0.7–4.0)
MCH: 30.7 pg (ref 26.0–34.0)
MCHC: 32.9 g/dL (ref 30.0–36.0)
MCV: 93.4 fL (ref 80.0–100.0)
Monocytes Absolute: 0.6 K/uL (ref 0.1–1.0)
Monocytes Relative: 7 %
Neutro Abs: 7.3 K/uL (ref 1.7–7.7)
Neutrophils Relative %: 85 %
Platelets: 181 K/uL (ref 150–400)
RBC: 4.07 MIL/uL — ABNORMAL LOW (ref 4.22–5.81)
RDW: 14.3 % (ref 11.5–15.5)
WBC: 8.5 K/uL (ref 4.0–10.5)
nRBC: 0 % (ref 0.0–0.2)

## 2024-06-29 LAB — BASIC METABOLIC PANEL WITH GFR
Anion gap: 12 (ref 5–15)
BUN: 74 mg/dL — ABNORMAL HIGH (ref 8–23)
CO2: 29 mmol/L (ref 22–32)
Calcium: 8.9 mg/dL (ref 8.9–10.3)
Chloride: 91 mmol/L — ABNORMAL LOW (ref 98–111)
Creatinine, Ser: 1.7 mg/dL — ABNORMAL HIGH (ref 0.61–1.24)
GFR, Estimated: 40 mL/min — ABNORMAL LOW (ref >60)
Glucose, Bld: 286 mg/dL — ABNORMAL HIGH (ref 70–99)
Potassium: 4.4 mmol/L (ref 3.5–5.1)
Sodium: 132 mmol/L — ABNORMAL LOW (ref 135–145)

## 2024-06-29 LAB — GLUCOSE, CAPILLARY
Glucose-Capillary: 270 mg/dL — ABNORMAL HIGH (ref 70–99)
Glucose-Capillary: 249 mg/dL — ABNORMAL HIGH (ref 70–99)
Glucose-Capillary: 241 mg/dL — ABNORMAL HIGH (ref 70–99)
Glucose-Capillary: 205 mg/dL — ABNORMAL HIGH (ref 70–99)

## 2024-06-29 LAB — PHOSPHORUS: Phosphorus: 4 mg/dL (ref 2.5–4.6)

## 2024-06-29 LAB — PROCALCITONIN: Procalcitonin: 0.11 ng/mL

## 2024-06-29 LAB — C-REACTIVE PROTEIN: CRP: 7.7 mg/dL — ABNORMAL HIGH (ref <1.0)

## 2024-06-29 LAB — MAGNESIUM: Magnesium: 2.3 mg/dL (ref 1.7–2.4)

## 2024-06-29 MED ORDER — LACTATED RINGERS IV SOLN: INTRAVENOUS | Status: AC

## 2024-06-29 NOTE — TOC Progression Note (Signed)
 Transition of Care (TOC) - Progression Note    Patient Details  Name: [name]. MRN: [id number] Date of Birth: 06/30/1941  Transition of Care [facility]) CM/SW Contact  Robynn Eileen Hoose, RN Phone Number: 06/29/2024, 1:46 PM  Clinical Narrative:   Discussed home health recommendations for PT and agency options. Patient wants to use [facility] since he had them in he past and had a great experience. Cory with [facility] able to accept patient back. Contact information placed on AVS. Will need HH orders with [facility].    Expected Discharge Plan: Home w Home Health Services Barriers to Discharge: Continued Medical Work up  Expected Discharge Plan and Services   Discharge Planning Services: CM Consult   Living arrangements for the past 2 months: Single Family Home                                       Social Determinants of Health (SDOH) Interventions SDOH Screenings   Food Insecurity: No Food Insecurity (06/24/2024)  Housing: Low Risk  (06/24/2024)  Transportation Needs: No Transportation Needs (06/24/2024)  Utilities: Not At Risk (06/24/2024)  Alcohol Screen: Low Risk  (05/25/2024)  Depression (PHQ2-9): Medium Risk (05/25/2024)  Financial Resource Strain: Low Risk  (06/14/2024)  Physical Activity: Inactive (06/14/2024)  Social Connections: Moderately Isolated (06/24/2024)  Stress: Stress Concern Present (06/14/2024)  Tobacco Use: Medium Risk (06/24/2024)  Health Literacy: Adequate Health Literacy (05/25/2024)    Readmission Risk Interventions    01/15/2024    1:16 PM  Readmission Risk Prevention Plan  Transportation Screening Complete  PCP or Specialist Appt within 5-7 Days Complete  Home Care Screening Complete  Medication Review (RN CM) Complete

## 2024-06-29 NOTE — Care Management Important Message (Signed)
 Important Message  Patient Details  Name: [name]. MRN: [id number] Date of Birth: December 20, 1941   Important Message Given:  Yes - Medicare IM  Patient left prior to IM delivery will mail to the patient home address.    Taylin Leder 06/29/2024, 9:15 AM

## 2024-06-29 NOTE — Progress Notes (Signed)
 PROGRESS NOTE                                                                                                                                                                                                             Patient Demographics:    [name], is a 83 y.o. male, DOB - Sep 23, 1941, FMW:[id number]  Outpatient Primary MD for the patient is Rudd, Garnette HERO, MD    LOS - 5  Admit date - 06/24/2024    Chief Complaint  Patient presents with   Weakness       Brief Narrative (HPI from H&P)   83 y.o. male with medical history significant of HFpEF (EF 55-60%in 10/2021), CAD s/p multiple PCIs, HTN, HLD, DM2, chronic respiratory failure 2/2 COPD (on 2L [place]), CKD, remote PE in 2006 and PVD p/w lethargy and found to have dehydration, AKI 2/2 overdiuresis and possible UTI.    Subjective:   Patient in bed, appears comfortable, denies any headache, no fever, no chest pain or pressure, no shortness of breath , no abdominal pain. No focal weakness.  Right foot pain much improved.   Assessment  & Plan :   Dehydration with AKI, hypokalemia likely due to overdiuresis.  Improving with IV fluids and potassium supplementation, continue to hold diuretics and hydrate, monitor renal function.  Nonacute renal ultrasound.  Presumed acute cystitis - Urine culture inconclusive, specimen likely poor however UA had enough WBCs and he had dysuria suggesting he had UTI, continue empiric Rocephin , he has had UTI in the past as well, has been recommended to follow-up with urology postdischarge.  Urine cultures showing 80,000 and subsequently 20,000 colonies of Staph aureus.  No signs of systemic infection, unremarkable procalcitonin.   Chest pain - Atypical likely due to underlying dry cough, troponin negative, chest pain-free now   HTN -PTA Coreg  25mg  BID and Imdur  60mg  daily   HLD -PTA Crestor    COPD -Breo Ellipta  daily + Duonebs    Ongoing  right foot pain.  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  Much improved after few doses of steroids, placed on colchicine , x-ray stable.  Chronic dysphagia present on admission.  Speech therapy following, evidence of diffuse esophageal spasm on barium swallow, per speech we are doing a barium swallow on 06/29/2024, GI also following the event EGD on 06/28/2024 suggesting possible esophagitis and possible mild  Candida infection, placed on Diflucan , await barium swallow.  Postdischarge follow-up with GI outpatient.  DM type II.  Placed on Semglee , Premeal NovoLog  and sliding scale monitor and adjust.  Slightly elevated due to 2 doses of IV steroids which have been stopped.  Lab Results  Component Value Date   HGBA1C 6.7 (H) 06/18/2024   CBG (last 3)  Recent Labs    06/28/24 1609 06/28/24 2128 06/29/24 0752  GLUCAP 184* 309* 270*         Condition - Fair  Family Communication  : None present  Code Status :  Full  Consults  : GI  PUD Prophylaxis :     Procedures  :     EGD on 06/28/2024   Impression:               - White nummular lesions in esophageal mucosa.                           - Gastritis with hemorrhage. Biopsied.                           - Normal examined duodenum.                           - Biopsies were taken with a cold forceps for                            histology in the proximal esophagus and in the                            distal esophagus.                           - Dilation performed in the entire esophagus. Recommendation:           - Return patient to hospital ward for ongoing care.                           - Await pathology results.                           - PPI BID for 10 weeks, then QD                           - Okay to restart Eliquis  tomorrow.                           - Will try an empiric course of fluconazole  for                            possible candida esophagitis. Pharmacy consult                            ordered to help with dosing this.                            - Speech therapy plans to perform MBSS tomorrow.                           -  We will arrange for outpatient GI follow up. If                            dysphagia persists, then could consider esophageal                            manometry in the future.                           - The findings and recommendations were discussed                            with the patient.  Barium swallow.  Diffuse esophageal spasm.    Renal ultrasound.  Nonacute.  CT head and C-spine.  Nonacute.      Disposition Plan  :    Status is: Inpatient   DVT Prophylaxis  :        Lab Results  Component Value Date   PLT 181 06/29/2024    Diet :  Diet Order             Diet regular Fluid consistency: Thin  Diet effective now                    Inpatient Medications  Scheduled Meds:  calcitRIOL   0.25 mcg Oral Daily   carvedilol   25 mg Oral BID WC   colchicine   0.6 mg Oral BID   finasteride   5 mg Oral Daily   fluconazole   200 mg Oral Daily   fluticasone  furoate-vilanterol  1 puff Inhalation Daily   guaiFENesin   600 mg Oral BID   insulin  aspart  0-15 Units Subcutaneous TID WC   insulin  aspart  0-5 Units Subcutaneous QHS   insulin  aspart  3 Units Subcutaneous TID WC   insulin  glargine-yfgn  15 Units Subcutaneous Daily   isosorbide  mononitrate  60 mg Oral Daily   pantoprazole   40 mg Oral BID   rosuvastatin   20 mg Oral Daily   traZODone   100 mg Oral QHS   umeclidinium bromide   1 puff Inhalation Daily   Continuous Infusions:   ceFAZolin  (ANCEF ) IV 1 g (06/29/24 0817)   lactated ringers      PRN Meds:.acetaminophen , guaiFENesin -dextromethorphan , ipratropium-albuterol , LORazepam , ondansetron  (ZOFRAN ) IV    Objective:   Vitals:   06/28/24 2020 06/29/24 0024 06/29/24 0504 06/29/24 0809  BP: 127/72 (!) 144/62 130/68 126/71  Pulse: 83 80 90 100  Resp: 17 16 15 16   Temp: 98.3 F (36.8 C) 98.3 F (36.8 C) 98.8 F (37.1 C)   TempSrc: Oral Oral Axillary    SpO2: 93% 100% 98% 100%  Weight:      Height:        Wt Readings from Last 3 Encounters:  06/28/24 94.8 kg  06/18/24 98 kg  03/12/24 93.4 kg     Intake/Output Summary (Last 24 hours) at 06/29/2024 0850 Last data filed at 06/29/2024 0813 Gross per 24 hour  Intake 1030 ml  Output 1350 ml  Net -320 ml     Physical Exam  Awake Alert, No new F.N deficits, Normal affect [place].AT,PERRAL Supple Neck, No JVD,   Symmetrical Chest wall movement, Good air movement bilaterally, CTAB RRR,No Gallops, Rubs or new Murmurs,  +ve B.Sounds, Abd Soft, No tenderness,   No Cyanosis, Clubbing or edema  Data Review:    Recent Labs  Lab 06/25/24 0545 06/26/24 0502 06/27/24 0429 06/28/24 0439 06/29/24 0516  WBC 9.8 8.6 11.2* 8.5 8.5  HGB 13.3 12.9* 13.4 12.5* 12.5*  HCT 40.6 39.4 40.8 37.2* 38.0*  PLT 196 176 166 148* 181  MCV 92.7 92.5 93.4 92.5 93.4  MCH 30.4 30.3 30.7 31.1 30.7  MCHC 32.8 32.7 32.8 33.6 32.9  RDW 14.2 14.4 14.2 14.0 14.3  LYMPHSABS  --  1.5 1.2 0.6* 0.6*  MONOABS  --  1.2* 1.6* 0.3 0.6  EOSABS  --  0.5 0.5 0.0 0.0  BASOSABS  --  0.0 0.0 0.0 0.0    Recent Labs  Lab 06/24/24 0825 06/24/24 2000 06/25/24 0545 06/25/24 0731 06/26/24 0502 06/27/24 0429 06/28/24 0439 06/29/24 0516  NA 129*  --  134*  --  138 133* 135 132*  K 3.1*  --  2.8*  --  3.5 4.1 4.3 4.4  CL 71*  --  80*  --  90* 91* 92* 91*  CO2 39*  --  35*  --  35* 31 34* 29  ANIONGAP 19*  --  19*  --  13 11 9 12   GLUCOSE 234*  --  169*  --  125* 198* 158* 286*  BUN 104*  --  86*  --  65* 54* 57* 74*  CREATININE 2.47*  --  2.07*  --  1.73* 1.63* 1.61* 1.70*  AST 29  --   --   --   --   --   --   --   ALT 16  --   --   --   --   --   --   --   ALKPHOS 68  --   --   --   --   --   --   --   BILITOT 1.4*  --   --   --   --   --   --   --   ALBUMIN  4.0  --   --   --   --   --   --   --   CRP  --   --   --  0.7 1.0* 4.7* 13.4* 7.7*  PROCALCITON  --    < >  --  0.10 <0.10 <0.10 0.11 0.11  TSH  --    --   --  2.554  --   --   --   --   BNP 97.1  --   --   --   --   --   --   --   MG  --   --   --  2.4 2.4 2.1 2.3 2.3  PHOS  --   --   --   --  3.4 3.1 3.9 4.0  CALCIUM  10.4*  --  9.2  --  9.4 9.6 9.6 8.9   < > = values in this interval not displayed.      Recent Labs  Lab 06/24/24 0825 06/24/24 2000 06/25/24 0545 06/25/24 0731 06/26/24 0502 06/27/24 0429 06/28/24 0439 06/29/24 0516  CRP  --   --   --  0.7 1.0* 4.7* 13.4* 7.7*  PROCALCITON  --    < >  --  0.10 <0.10 <0.10 0.11 0.11  TSH  --   --   --  2.554  --   --   --   --   BNP 97.1  --   --   --   --   --   --   --  MG  --   --   --  2.4 2.4 2.1 2.3 2.3  CALCIUM  10.4*  --  9.2  --  9.4 9.6 9.6 8.9   < > = values in this interval not displayed.    --------------------------------------------------------------------------------------------------------------- Lab Results  Component Value Date   CHOL 142 09/20/2023   HDL 52.50 09/20/2023   LDLCALC 71 09/20/2023   TRIG 94.0 09/20/2023   CHOLHDL 3 09/20/2023    Lab Results  Component Value Date   HGBA1C 6.7 (H) 06/18/2024    Radiology Report DG Foot Complete Right Result Date: 06/27/2024 CLINICAL DATA:  Right foot pain. EXAM: RIGHT FOOT COMPLETE - 3+ VIEW COMPARISON:  Radiographs 06/24/2024 and 06/02/2011. FINDINGS: The bones appear mildly demineralized. No evidence of acute fracture or dislocation. No evidence of bone destruction. Stable mild degenerative changes for age, primarily at the 1st metatarsophalangeal joint. Scattered vascular calcifications and dorsal subcutaneous edema are unchanged from the recent radiographs. IMPRESSION: Stable right foot radiographs without acute osseous findings. Electronically Signed   By: Elsie Perone M.D.   On: 06/27/2024 11:41     Signature  -   Lavada Stank M.D on 06/29/2024 at 8:50 AM   -  To page go to [email or web address]

## 2024-06-29 NOTE — Progress Notes (Signed)
 PT Cancellation Note  Patient Details Name: [name]. MRN: [id number] DOB: Nov 25, 1941   Cancelled Treatment:    Reason Eval/Treat Not Completed: Patient at procedure or test/unavailable (Pt declined stating that he has diarrhea. Politely requests PT to follow up tomorrow.)   Tymir Terral 06/29/2024, 3:25 PM

## 2024-06-29 NOTE — Progress Notes (Signed)
 Speech Language Pathology Treatment: Dysphagia  Patient Details Name: [name]. MRN: [id number] DOB: 12-25-1940 Today's Date: 06/29/2024 Time: 9164-9155 SLP Time Calculation (min) (ACUTE ONLY): 9 min  Assessment / Plan / Recommendation Clinical Impression  Pt reported subjective improvement in swallow function since EGD with dilation. SLP observed pt consume thin liquids by cup and graham crackers with no overt or subtle s/s of aspiration or concerns for backflow. Pt denied globus sensation. He is hopeful to advance his diet today.   Recommend diet advancement to a regular consistency diet with selection of softer foods. Continue thin liquids and PO meds as tolerated. SLP to follow up x1 to assess diet tolerance.    HPI HPI: [name]. is a 83 y.o. male with medical history significant of HFpEF (EF 55-60%in 10/2021), CAD s/p multiple PCIs, HTN, HLD, DM2, chronic respiratory failure 2/2 COPD (on 2L [place]), CKD, remote PE in 2006 and PVD p/w lethargy and found to have AKI 2/2 overdiuresis and possible UTI. Pt underwent EGD + dilation on 06/28/24.      SLP Plan  Continue with current plan of care          Recommendations  Diet recommendations: Regular;Thin liquid Liquids provided via: Cup;Straw Medication Administration: Whole meds with liquid Supervision: Patient able to self feed Compensations: Slow rate;Small sips/bites Postural Changes and/or Swallow Maneuvers: Seated upright 90 degrees;Upright 30-60 min after meal                  Oral care BID    (defer to PT/OT) Dysphagia, unspecified (R13.10)     Continue with current plan of care     Peyton JINNY Rummer  06/29/2024, 9:02 AM

## 2024-06-30 ENCOUNTER — Inpatient Hospital Stay (HOSPITAL_COMMUNITY)

## 2024-06-30 ENCOUNTER — Encounter (HOSPITAL_COMMUNITY): Payer: Self-pay | Admitting: Internal Medicine

## 2024-06-30 ENCOUNTER — Ambulatory Visit: Payer: Self-pay | Admitting: Internal Medicine

## 2024-06-30 DIAGNOSIS — N179 Acute kidney failure, unspecified: Secondary | ICD-10-CM | POA: Diagnosis not present

## 2024-06-30 LAB — PROCALCITONIN: Procalcitonin: <0.1 ng/mL

## 2024-06-30 LAB — BASIC METABOLIC PANEL WITH GFR
Anion gap: 14 (ref 5–15)
BUN: 78 mg/dL — ABNORMAL HIGH (ref 8–23)
CO2: 28 mmol/L (ref 22–32)
Calcium: 8.7 mg/dL — ABNORMAL LOW (ref 8.9–10.3)
Chloride: 92 mmol/L — ABNORMAL LOW (ref 98–111)
Creatinine, Ser: 1.77 mg/dL — ABNORMAL HIGH (ref 0.61–1.24)
GFR, Estimated: 38 mL/min — ABNORMAL LOW (ref >60)
Glucose, Bld: 308 mg/dL — ABNORMAL HIGH (ref 70–99)
Potassium: 4.1 mmol/L (ref 3.5–5.1)
Sodium: 134 mmol/L — ABNORMAL LOW (ref 135–145)

## 2024-06-30 LAB — CBC WITH DIFFERENTIAL/PLATELET
Abs Immature Granulocytes: 0.2 K/uL — ABNORMAL HIGH (ref 0.00–0.07)
Basophils Absolute: 0 K/uL (ref 0.0–0.1)
Basophils Relative: 0 %
Eosinophils Absolute: 0.1 K/uL (ref 0.0–0.5)
Eosinophils Relative: 0 %
HCT: 40.2 % (ref 39.0–52.0)
Hemoglobin: 13 g/dL (ref 13.0–17.0)
Immature Granulocytes: 2 %
Lymphocytes Relative: 10 %
Lymphs Abs: 1.2 K/uL (ref 0.7–4.0)
MCH: 30.7 pg (ref 26.0–34.0)
MCHC: 32.3 g/dL (ref 30.0–36.0)
MCV: 94.8 fL (ref 80.0–100.0)
Monocytes Absolute: 1.6 K/uL — ABNORMAL HIGH (ref 0.1–1.0)
Monocytes Relative: 13 %
Neutro Abs: 8.6 K/uL — ABNORMAL HIGH (ref 1.7–7.7)
Neutrophils Relative %: 75 %
Platelets: 201 K/uL (ref 150–400)
RBC: 4.24 MIL/uL (ref 4.22–5.81)
RDW: 14.4 % (ref 11.5–15.5)
WBC: 11.6 K/uL — ABNORMAL HIGH (ref 4.0–10.5)
nRBC: 0 % (ref 0.0–0.2)

## 2024-06-30 LAB — SURGICAL PATHOLOGY

## 2024-06-30 LAB — GLUCOSE, CAPILLARY
Glucose-Capillary: 179 mg/dL — ABNORMAL HIGH (ref 70–99)
Glucose-Capillary: 34 mg/dL — CL (ref 70–99)
Glucose-Capillary: 154 mg/dL — ABNORMAL HIGH (ref 70–99)
Glucose-Capillary: 129 mg/dL — ABNORMAL HIGH (ref 70–99)
Glucose-Capillary: 347 mg/dL — ABNORMAL HIGH (ref 70–99)
Glucose-Capillary: 282 mg/dL — ABNORMAL HIGH (ref 70–99)
Glucose-Capillary: 191 mg/dL — ABNORMAL HIGH (ref 70–99)

## 2024-06-30 LAB — BRAIN NATRIURETIC PEPTIDE: B Natriuretic Peptide: 772.3 pg/mL — ABNORMAL HIGH (ref 0.0–100.0)

## 2024-06-30 LAB — C-REACTIVE PROTEIN: CRP: 3.4 mg/dL — ABNORMAL HIGH (ref <1.0)

## 2024-06-30 LAB — MAGNESIUM: Magnesium: 2.1 mg/dL (ref 1.7–2.4)

## 2024-06-30 MED ORDER — INSULIN GLARGINE-YFGN 100 UNIT/ML ~~LOC~~ SOLN
12.0000 [IU] | Freq: Every day | SUBCUTANEOUS | Status: DC
  Filled 2024-06-30: qty 0.12

## 2024-06-30 MED ORDER — DEXTROSE 50 % IV SOLN: 25.0000 g | INTRAVENOUS | Status: AC

## 2024-06-30 MED ORDER — ENOXAPARIN SODIUM 40 MG/0.4ML IJ SOSY
40.0000 mg | PREFILLED_SYRINGE | INTRAMUSCULAR | Status: DC
  Administered 2024-06-30 – 2024-07-01 (×2): 40 mg via SUBCUTANEOUS
  Filled 2024-06-30 (×2): qty 0.4

## 2024-06-30 MED ORDER — IPRATROPIUM-ALBUTEROL 0.5-2.5 (3) MG/3ML IN SOLN
3.0000 mL | Freq: Once | RESPIRATORY_TRACT | Status: AC
  Administered 2024-06-30: 3 mL via RESPIRATORY_TRACT

## 2024-06-30 MED ORDER — INSULIN ASPART 100 UNIT/ML IJ SOLN
0.0000 [IU] | Freq: Three times a day (TID) | INTRAMUSCULAR | Status: DC
  Administered 2024-06-30: 5 [IU] via SUBCUTANEOUS
  Administered 2024-06-30 – 2024-07-01 (×2): 7 [IU] via SUBCUTANEOUS

## 2024-06-30 MED ORDER — FUROSEMIDE 10 MG/ML IJ SOLN
40.0000 mg | Freq: Once | INTRAMUSCULAR | Status: AC
  Administered 2024-06-30: 40 mg via INTRAVENOUS
  Filled 2024-06-30: qty 4

## 2024-06-30 MED ORDER — LOPERAMIDE HCL 2 MG PO CAPS
2.0000 mg | ORAL_CAPSULE | Freq: Four times a day (QID) | ORAL | Status: DC | PRN
  Administered 2024-06-30 (×2): 2 mg via ORAL
  Filled 2024-06-30 (×2): qty 1

## 2024-06-30 MED ORDER — INSULIN ASPART 100 UNIT/ML IJ SOLN: 0.0000 [IU] | INTRAMUSCULAR | Status: DC

## 2024-06-30 MED ORDER — INSULIN GLARGINE-YFGN 100 UNIT/ML ~~LOC~~ SOLN
12.0000 [IU] | Freq: Every day | SUBCUTANEOUS | Status: DC
  Administered 2024-06-30 – 2024-07-01 (×2): 12 [IU] via SUBCUTANEOUS
  Filled 2024-06-30 (×2): qty 0.12

## 2024-06-30 MED ORDER — COLCHICINE 0.6 MG PO TABS
0.6000 mg | ORAL_TABLET | Freq: Every day | ORAL | Status: DC
  Administered 2024-07-01: 0.6 mg via ORAL
  Filled 2024-06-30: qty 1

## 2024-06-30 MED ORDER — LORAZEPAM 0.5 MG PO TABS
0.5000 mg | ORAL_TABLET | Freq: Once | ORAL | Status: AC | PRN
Start: 2024-06-30 — End: 2024-06-30
  Administered 2024-06-30: 0.5 mg via ORAL
  Filled 2024-06-30: qty 1

## 2024-06-30 MED ORDER — INSULIN ASPART 100 UNIT/ML IJ SOLN: 0.0000 [IU] | Freq: Every day | INTRAMUSCULAR | Status: DC

## 2024-06-30 MED ORDER — DEXTROSE 50 % IV SOLN
INTRAVENOUS | Status: AC
  Administered 2024-06-30: 50 mL via INTRAVENOUS
  Filled 2024-06-30: qty 50

## 2024-06-30 NOTE — Plan of Care (Signed)

## 2024-06-30 NOTE — Progress Notes (Signed)
 PROGRESS NOTE                                                                                                                                                                                                             Patient Demographics:    [name], is a 83 y.o. male, DOB - 1941/01/27, FMW:[id number]  Outpatient Primary MD for the patient is Rudd, Garnette HERO, MD    LOS - 6  Admit date - 06/24/2024    Chief Complaint  Patient presents with   Weakness       Brief Narrative (HPI from H&P)   83 y.o. male with medical history significant of HFpEF (EF 55-60%in 10/2021), CAD s/p multiple PCIs, HTN, HLD, DM2, chronic respiratory failure 2/2 COPD (on 2L [place]), CKD, remote PE in 2006 and PVD p/w lethargy and found to have dehydration, AKI 2/2 overdiuresis and possible UTI.    Subjective:   Patient in bed, appears comfortable, denies any headache, no fever, no chest pain or pressure, no shortness of breath , no abdominal pain. No focal weakness.  Right foot pain much improved.   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  Responded well to IV fluids for hydration, nonacute renal ultrasound, this problem has resolved.  Baseline creatinine around 1.6.  Presumed acute cystitis - Urine culture inconclusive, specimen likely poor however UA had enough WBCs and he had dysuria suggesting he had UTI, continue empiric Rocephin , he has had UTI in the past as well, has been recommended to follow-up with urology postdischarge.  Urine cultures showing 80,000 and subsequently 20,000 colonies of Staph aureus.  No signs of systemic infection, unremarkable procalcitonin.  Case discussed with ID Dr. Lindia this likely represents contamination.   Chest pain - Atypical likely due to underlying dry cough, troponin negative, chest pain-free now   HTN -PTA Coreg  25mg  BID and Imdur  60mg  daily   Acute  on chronic diastolic CHF 06/30/2024.  EF 60% on last echocardiogram.  Home Lasix  was held as he had dehydration and AKI upon admission, gentle Lasix  on 06/30/2024, nebulizer treatments and nasal cannula oxygen .  Continue beta-blocker and monitor.    HLD -PTA Crestor    COPD -Breo Ellipta  daily + Duonebs    Ongoing right foot pain.  Acute on chronic  gout.  Much improved after few doses of steroids, placed on colchicine , x-ray stable.  Chronic dysphagia present on admission.  Speech therapy following, evidence of diffuse esophageal spasm on barium swallow, per speech we are doing a barium swallow on 06/29/2024, GI also following the event EGD on 06/28/2024 suggesting possible esophagitis and possible mild Candida infection, placed on Diflucan , speech and GI no further testing needed.  Postdischarge follow-up with GI outpatient.  DM type II.  Placed on Semglee , Premeal NovoLog  and sliding scale monitor and adjust.  Slightly elevated due to 2 doses of IV steroids which have been stopped.  Lab Results  Component Value Date   HGBA1C 6.7 (H) 06/18/2024   CBG (last 3)  Recent Labs    06/30/24 0215 06/30/24 0412 06/30/24 0818  GLUCAP 179* 154* 129*         Condition - Fair  Family Communication  : None present  Code Status :  Full  Consults  : GI  PUD Prophylaxis :     Procedures  :     EGD on 06/28/2024   Impression:               - White nummular lesions in esophageal mucosa.                           - Gastritis with hemorrhage. Biopsied.                           - Normal examined duodenum.                           - Biopsies were taken with a cold forceps for                            histology in the proximal esophagus and in the                            distal esophagus.                           - Dilation performed in the entire esophagus. Recommendation:           - Return patient to hospital ward for ongoing care.                           - Await pathology results.                            - PPI BID for 10 weeks, then QD                           - Okay to restart Eliquis  tomorrow.                           - Will try an empiric course of fluconazole  for                            possible candida esophagitis. Pharmacy consult  ordered to help with dosing this.                           - Speech therapy plans to perform MBSS tomorrow.                           - We will arrange for outpatient GI follow up. If                            dysphagia persists, then could consider esophageal                            manometry in the future.                           - The findings and recommendations were discussed                            with the patient.  Barium swallow.  Diffuse esophageal spasm.    Renal ultrasound.  Nonacute.  CT head and C-spine.  Nonacute.      Disposition Plan  :    Status is: Inpatient   DVT Prophylaxis  :  Lovenox   enoxaparin  (LOVENOX ) injection 40 mg Start: 06/30/24 1000 Lab Results  Component Value Date   PLT 181 06/29/2024    Diet :  Diet Order             Diet regular Fluid consistency: Thin  Diet effective now                    Inpatient Medications  Scheduled Meds:  calcitRIOL   0.25 mcg Oral Daily   carvedilol   25 mg Oral BID WC   [START ON 07/01/2024] colchicine   0.6 mg Oral Daily   enoxaparin  (LOVENOX ) injection  40 mg Subcutaneous Q24H   finasteride   5 mg Oral Daily   fluconazole   200 mg Oral Daily   fluticasone  furoate-vilanterol  1 puff Inhalation Daily   furosemide   40 mg Intravenous Once   guaiFENesin   600 mg Oral BID   insulin  aspart  0-5 Units Subcutaneous QHS   insulin  aspart  0-9 Units Subcutaneous TID WC   insulin  glargine-yfgn  12 Units Subcutaneous QHS   isosorbide  mononitrate  60 mg Oral Daily   pantoprazole   40 mg Oral BID   rosuvastatin   20 mg Oral Daily   traZODone   100 mg Oral QHS   umeclidinium bromide   1 puff Inhalation Daily    Continuous Infusions:   ceFAZolin  (ANCEF ) IV 1 g (06/29/24 2233)   PRN Meds:.acetaminophen , guaiFENesin -dextromethorphan , ipratropium-albuterol , ondansetron  (ZOFRAN ) IV    Objective:   Vitals:   06/30/24 0413 06/30/24 0812 06/30/24 0814 06/30/24 0818  BP: (!) 149/71   136/69  Pulse: 81   91  Resp: 17   18  Temp: 97.8 F (36.6 C)   98.7 F (37.1 C)  TempSrc: Temporal   Oral  SpO2: 99% 96% 100% 98%  Weight:      Height:        Wt Readings from Last 3 Encounters:  06/28/24 94.8 kg  06/18/24 98 kg  03/12/24 93.4 kg     Intake/Output Summary (Last 24 hours) at 06/30/2024 9085 Last data filed at  06/30/2024 9366 Gross per 24 hour  Intake 716.95 ml  Output 1225 ml  Net -508.05 ml     Physical Exam  Awake Alert, No new F.N deficits, anxious affect [place].AT,PERRAL Supple Neck, No JVD,   Symmetrical Chest wall movement, Good air movement bilaterally, bilateral crackles RRR,No Gallops, Rubs or new Murmurs,  +ve B.Sounds, Abd Soft, No tenderness,   No Cyanosis, Clubbing or edema       Data Review:    Recent Labs  Lab 06/25/24 0545 06/26/24 0502 06/27/24 0429 06/28/24 0439 06/29/24 0516  WBC 9.8 8.6 11.2* 8.5 8.5  HGB 13.3 12.9* 13.4 12.5* 12.5*  HCT 40.6 39.4 40.8 37.2* 38.0*  PLT 196 176 166 148* 181  MCV 92.7 92.5 93.4 92.5 93.4  MCH 30.4 30.3 30.7 31.1 30.7  MCHC 32.8 32.7 32.8 33.6 32.9  RDW 14.2 14.4 14.2 14.0 14.3  LYMPHSABS  --  1.5 1.2 0.6* 0.6*  MONOABS  --  1.2* 1.6* 0.3 0.6  EOSABS  --  0.5 0.5 0.0 0.0  BASOSABS  --  0.0 0.0 0.0 0.0    Recent Labs  Lab 06/24/24 0825 06/24/24 2000 06/25/24 0545 06/25/24 0731 06/26/24 0502 06/27/24 0429 06/28/24 0439 06/29/24 0516  NA 129*  --  134*  --  138 133* 135 132*  K 3.1*  --  2.8*  --  3.5 4.1 4.3 4.4  CL 71*  --  80*  --  90* 91* 92* 91*  CO2 39*  --  35*  --  35* 31 34* 29  ANIONGAP 19*  --  19*  --  13 11 9 12   GLUCOSE 234*  --  169*  --  125* 198* 158* 286*  BUN 104*  --  86*  --  65* 54*  57* 74*  CREATININE 2.47*  --  2.07*  --  1.73* 1.63* 1.61* 1.70*  AST 29  --   --   --   --   --   --   --   ALT 16  --   --   --   --   --   --   --   ALKPHOS 68  --   --   --   --   --   --   --   BILITOT 1.4*  --   --   --   --   --   --   --   ALBUMIN  4.0  --   --   --   --   --   --   --   CRP  --   --   --  0.7 1.0* 4.7* 13.4* 7.7*  PROCALCITON  --    < >  --  0.10 <0.10 <0.10 0.11 0.11  TSH  --   --   --  2.554  --   --   --   --   BNP 97.1  --   --   --   --   --   --   --   MG  --   --   --  2.4 2.4 2.1 2.3 2.3  PHOS  --   --   --   --  3.4 3.1 3.9 4.0  CALCIUM  10.4*  --  9.2  --  9.4 9.6 9.6 8.9   < > = values in this interval not displayed.      Recent Labs  Lab 06/24/24 0825 06/24/24 2000 06/25/24 0545 06/25/24 9268  06/26/24 0502 06/27/24 0429 06/28/24 0439 06/29/24 0516  CRP  --   --   --  0.7 1.0* 4.7* 13.4* 7.7*  PROCALCITON  --    < >  --  0.10 <0.10 <0.10 0.11 0.11  TSH  --   --   --  2.554  --   --   --   --   BNP 97.1  --   --   --   --   --   --   --   MG  --   --   --  2.4 2.4 2.1 2.3 2.3  CALCIUM  10.4*  --  9.2  --  9.4 9.6 9.6 8.9   < > = values in this interval not displayed.    --------------------------------------------------------------------------------------------------------------- Lab Results  Component Value Date   CHOL 142 09/20/2023   HDL 52.50 09/20/2023   LDLCALC 71 09/20/2023   TRIG 94.0 09/20/2023   CHOLHDL 3 09/20/2023    Lab Results  Component Value Date   HGBA1C 6.7 (H) 06/18/2024    Radiology Report DG Chest Port 1 View Result Date: 06/30/2024 CLINICAL DATA:  141880 SOB (shortness of breath) [id number] EXAM: PORTABLE CHEST - 1 VIEW COMPARISON:  June 25, 2024, June 24, 2024 FINDINGS: Mild central pulmonary vascular congestion. No focal airspace consolidation or pneumothorax. Blunting of both costophrenic sulci, possibly trace effusions. Mild cardiomegaly. Tortuous aorta with aortic atherosclerosis. No acute fracture or  destructive lesions. Multilevel thoracic osteophytosis. IMPRESSION: Mild central pulmonary vascular congestion and trace bilateral pleural effusions. Electronically Signed   By: Rogelia Myers M.D.   On: 06/30/2024 08:13     Signature  -   Lavada Stank M.D on 06/30/2024 at 9:14 AM   -  To page go to [email or web address]

## 2024-06-30 NOTE — Progress Notes (Signed)
 Physical Therapy Treatment Patient Details Name: [name]. MRN: [id number] DOB: [birth year] Today's Date: 06/30/2024   History of Present Illness Pt is an 83 y/o male admitted 7/2 with lethargy and found to have AKI due to over-diuresis and possible UTI.  His Course complicated by a R forefoot/foot sprain making in difficult to mobilize.  EFY:RNEI, chronic respiratory failure, diabetes mellitus, CAD, PAF on Eliquis , history of CVA with left-sided weakness, and chronic HFpEF, Lap-Band    PT Comments  Pt making steady progress with mobility. Fatigues quickly and needs rest breaks. Continue to recommend home with family and HHPT.     If plan is discharge home, recommend the following: A little help with walking and/or transfers;Assistance with cooking/housework;Assist for transportation   Can travel by private vehicle        Equipment Recommendations  None recommended by PT    Recommendations for Other Services       Precautions / Restrictions Precautions Precautions: Fall Recall of Precautions/Restrictions: Intact Restrictions Weight Bearing Restrictions Per Provider Order: No     Mobility  Bed Mobility               General bed mobility comments: OOB on arrival    Transfers Overall transfer level: Needs assistance Equipment used: Rolling walker (2 wheels) Transfers: Sit to/from Stand Sit to Stand: Supervision                Ambulation/Gait Ambulation/Gait assistance: Supervision Gait Distance (Feet): 50 Feet (50' x1, 20' x 1) Assistive device: Rolling walker (2 wheels) Gait Pattern/deviations: Step-through pattern, Decreased stride length Gait velocity: decr Gait velocity interpretation: <1.31 ft/sec, indicative of household ambulator   General Gait Details: supervision for safety, 1 seated rest break   Stairs             Wheelchair Mobility     Tilt Bed    Modified Rankin (Stroke Patients Only)       Balance Overall balance  assessment: Needs assistance Sitting-balance support: No upper extremity supported, Feet supported Sitting balance-Leahy Scale: Fair     Standing balance support: Bilateral upper extremity supported, Single extremity supported, Reliant on assistive device for balance Standing balance-Leahy Scale: Poor Standing balance comment: reliant on walker                            Communication Communication Communication: No apparent difficulties  Cognition Arousal: Alert Behavior During Therapy: WFL for tasks assessed/performed   PT - Cognitive impairments: No apparent impairments                         Following commands: Intact      Cueing Cueing Techniques: Verbal cues  Exercises      General Comments General comments (skin integrity, edema, etc.): SpO2 98% on 2L at rest. Amb on 3L with SpO2 96% after.      Pertinent Vitals/Pain Pain Assessment Pain Assessment: No/denies pain    Home Living                          Prior Function            PT Goals (current goals can now be found in the care plan section) Acute Rehab PT Goals Patient Stated Goal: Return home Progress towards PT goals: Progressing toward goals    Frequency    Min 2X/week  PT Plan      Co-evaluation              AM-PAC PT 6 Clicks Mobility   Outcome Measure  Help needed turning from your back to your side while in a flat bed without using bedrails?: None Help needed moving from lying on your back to sitting on the side of a flat bed without using bedrails?: A Little Help needed moving to and from a bed to a chair (including a wheelchair)?: A Little Help needed standing up from a chair using your arms (e.g., wheelchair or bedside chair)?: A Little Help needed to walk in hospital room?: A Little Help needed climbing 3-5 steps with a railing? : A Lot 6 Click Score: 18    End of Session Equipment Utilized During Treatment: Oxygen  Activity  Tolerance: Patient tolerated treatment well Patient left: in chair;with call bell/phone within reach   PT Visit Diagnosis: Other abnormalities of gait and mobility (R26.89);Difficulty in walking, not elsewhere classified (R26.2)     Time: 8856-8788 PT Time Calculation (min) (ACUTE ONLY): 28 min  Charges:    $Gait Training: 23-37 mins PT General Charges $$ ACUTE PT VISIT: 1 Visit                     [facility] PT Acute Rehabilitation Services Office [phone]    [facility] ORN [facility] 06/30/2024, 2:28 PM

## 2024-06-30 NOTE — Anesthesia Postprocedure Evaluation (Signed)
 Anesthesia Post Note  Patient: [name].  Procedure(s) Performed: EGD (ESOPHAGOGASTRODUODENOSCOPY)     Patient location during evaluation: Endoscopy Anesthesia Type: MAC Level of consciousness: awake and alert Pain management: pain level controlled Vital Signs Assessment: post-procedure vital signs reviewed and stable Respiratory status: spontaneous breathing, nonlabored ventilation, respiratory function stable and patient connected to nasal cannula oxygen  Cardiovascular status: stable and blood pressure returned to baseline Postop Assessment: no apparent nausea or vomiting Anesthetic complications: no   No notable events documented.  Last Vitals:  Vitals:   06/30/24 0818 06/30/24 0936  BP: 136/69 (!) 145/85  Pulse: 91 81  Resp: 18   Temp: 37.1 C   SpO2: 98% 97%    Last Pain:  Vitals:   06/30/24 0818  TempSrc: Oral  PainSc:                  Macallan Ord S

## 2024-06-30 NOTE — Plan of Care (Signed)
  Problem: Fluid Volume: Goal: Ability to maintain a balanced intake and output will improve Outcome: Progressing   Problem: Metabolic: Goal: Ability to maintain appropriate glucose levels will improve Outcome: Progressing   Problem: Nutritional: Goal: Maintenance of adequate nutrition will improve Outcome: Progressing   Problem: Clinical Measurements: Goal: Will remain free from infection Outcome: Progressing Goal: Diagnostic test results will improve Outcome: Progressing   Problem: Nutrition: Goal: Adequate nutrition will be maintained Outcome: Progressing

## 2024-06-30 NOTE — Progress Notes (Signed)
 Patient with episode of hypoglycemia, CBG reading was 34. Patient symptomatic, called nursing staff to room c/o feeling bad, visibly diaphoretic, flushed. Dr. Alfornia notified and D50 ordered, see [facility] for administration details. CBG recheck was 179. Patient also given orange and apple juice. All insulin  orders discontinued for now and CBG will be checked q 4 hr per order. Patient updated with plan of care and verbalized understanding.

## 2024-07-01 ENCOUNTER — Other Ambulatory Visit (HOSPITAL_COMMUNITY): Payer: Self-pay

## 2024-07-01 ENCOUNTER — Inpatient Hospital Stay (HOSPITAL_COMMUNITY)

## 2024-07-01 DIAGNOSIS — N179 Acute kidney failure, unspecified: Secondary | ICD-10-CM | POA: Diagnosis not present

## 2024-07-01 LAB — GLUCOSE, CAPILLARY
Glucose-Capillary: 118 mg/dL — ABNORMAL HIGH (ref 70–99)
Glucose-Capillary: 63 mg/dL — ABNORMAL LOW (ref 70–99)
Glucose-Capillary: 302 mg/dL — ABNORMAL HIGH (ref 70–99)

## 2024-07-01 LAB — CBC WITH DIFFERENTIAL/PLATELET
Abs Immature Granulocytes: 0.03 K/uL (ref 0.00–0.07)
Basophils Absolute: 0 K/uL (ref 0.0–0.1)
Basophils Relative: 0 %
Eosinophils Absolute: 0.2 K/uL (ref 0.0–0.5)
Eosinophils Relative: 2 %
HCT: 38.5 % — ABNORMAL LOW (ref 39.0–52.0)
Hemoglobin: 12.6 g/dL — ABNORMAL LOW (ref 13.0–17.0)
Immature Granulocytes: 0 %
Lymphocytes Relative: 15 %
Lymphs Abs: 1.3 K/uL (ref 0.7–4.0)
MCH: 30.7 pg (ref 26.0–34.0)
MCHC: 32.7 g/dL (ref 30.0–36.0)
MCV: 93.7 fL (ref 80.0–100.0)
Monocytes Absolute: 1.7 K/uL — ABNORMAL HIGH (ref 0.1–1.0)
Monocytes Relative: 19 %
Neutro Abs: 5.5 K/uL (ref 1.7–7.7)
Neutrophils Relative %: 64 %
Platelets: 207 K/uL (ref 150–400)
RBC: 4.11 MIL/uL — ABNORMAL LOW (ref 4.22–5.81)
RDW: 14.5 % (ref 11.5–15.5)
WBC: 8.8 K/uL (ref 4.0–10.5)
nRBC: 0 % (ref 0.0–0.2)

## 2024-07-01 LAB — BASIC METABOLIC PANEL WITH GFR
Anion gap: 7 (ref 5–15)
BUN: 67 mg/dL — ABNORMAL HIGH (ref 8–23)
CO2: 31 mmol/L (ref 22–32)
Calcium: 8.1 mg/dL — ABNORMAL LOW (ref 8.9–10.3)
Chloride: 96 mmol/L — ABNORMAL LOW (ref 98–111)
Creatinine, Ser: 1.82 mg/dL — ABNORMAL HIGH (ref 0.61–1.24)
GFR, Estimated: 37 mL/min — ABNORMAL LOW (ref >60)
Glucose, Bld: 65 mg/dL — ABNORMAL LOW (ref 70–99)
Potassium: 3.6 mmol/L (ref 3.5–5.1)
Sodium: 134 mmol/L — ABNORMAL LOW (ref 135–145)

## 2024-07-01 LAB — C-REACTIVE PROTEIN: CRP: 5.3 mg/dL — ABNORMAL HIGH (ref <1.0)

## 2024-07-01 LAB — BRAIN NATRIURETIC PEPTIDE: B Natriuretic Peptide: 1008.1 pg/mL — ABNORMAL HIGH (ref 0.0–100.0)

## 2024-07-01 LAB — MAGNESIUM: Magnesium: 2.1 mg/dL (ref 1.7–2.4)

## 2024-07-01 MED ORDER — TORSEMIDE 20 MG PO TABS: 60.0000 mg | ORAL_TABLET | Freq: Every day | ORAL | Status: DC

## 2024-07-01 MED ORDER — PANTOPRAZOLE SODIUM 40 MG PO TBEC
40.0000 mg | DELAYED_RELEASE_TABLET | Freq: Two times a day (BID) | ORAL | 0 refills | Status: DC
  Filled 2024-07-01: qty 120, 60d supply, fill #0

## 2024-07-01 MED ORDER — COLCHICINE 0.6 MG PO TABS
0.6000 mg | ORAL_TABLET | ORAL | 0 refills | Status: DC
  Filled 2024-07-01: qty 3, 6d supply, fill #0

## 2024-07-01 MED ORDER — CEPHALEXIN 500 MG PO CAPS
500.0000 mg | ORAL_CAPSULE | Freq: Two times a day (BID) | ORAL | 0 refills | Status: AC
Start: 2024-07-01 — End: 2024-07-03
  Filled 2024-07-01: qty 4, 2d supply, fill #0

## 2024-07-01 MED ORDER — FLUCONAZOLE 200 MG PO TABS
200.0000 mg | ORAL_TABLET | Freq: Every day | ORAL | 0 refills | Status: DC
  Filled 2024-07-01: qty 11, 11d supply, fill #0

## 2024-07-01 MED ORDER — APIXABAN 2.5 MG PO TABS: 2.5000 mg | ORAL_TABLET | Freq: Two times a day (BID) | ORAL | Status: AC

## 2024-07-01 NOTE — Progress Notes (Addendum)
 Speech Language Pathology Treatment: Dysphagia  Patient Details Name: [name]. MRN: [id number] DOB: [birth year] Today's Date: 07/01/2024 Time: 9164-9144 SLP Time Calculation (min) (ACUTE ONLY): 20 min  Assessment / Plan / Recommendation Clinical Impression  Pt continues to have occasional, delayed and somewhat immediate coughing spells following PO intake - specifically thin liquids. He reported anxiety and concern with coughing spells and some sensation of backflow. SLP recommended proceeding with MBSS for further objective assessment of oropharyngeal swallow function with liquids and solids to determine if any strategies may help alleviate coughing spells. Pt is agreement.   MBSS + esophageal sweep anticipated later this morning.    HPI HPI: [name]. is a 83 y.o. male with medical history significant of HFpEF (EF 55-60%in 10/2021), CAD s/p multiple PCIs, HTN, HLD, DM2, chronic respiratory failure 2/2 COPD (on 2L [place]), CKD, remote PE in 2006 and PVD p/w lethargy and found to have AKI 2/2 overdiuresis and possible UTI. Pt underwent EGD + dilation on 06/28/24.      SLP Plan  Continue with current plan of care;MBS (MBSS at 10am today)          Recommendations  Diet recommendations: Regular;Thin liquid Liquids provided via: Cup;Straw Medication Administration: Whole meds with liquid Supervision: Patient able to self feed Compensations: Slow rate;Small sips/bites Postural Changes and/or Swallow Maneuvers: Seated upright 90 degrees;Upright 30-60 min after meal                  Oral care BID     Dysphagia, unspecified (R13.10)     Continue with current plan of care;MBS (MBSS at 10am today)     [name]  07/01/2024, 9:08 AM

## 2024-07-01 NOTE — TOC Progression Note (Signed)
 Transition of Care (TOC) - Progression Note    Patient Details  Name: [name]. MRN: [id number] Date of Birth: 06-29-1941  Transition of [facility]) CM/SW Contact  Nola Devere Hands, RN Phone Number: 07/01/2024, 11:37 AM  Clinical Narrative:    Case manager spoke with patient concerning discharge needs. Patient was active with [facility] prior to admission, wants to resume and requested that he still have same therapist-David. CM shared this with Joane Gowda, [facility] Liaison. Patient will have family support at discharge, has DME.    Expected Discharge Plan: Home w Home Health Services Barriers to Discharge: No Barriers Identified  Expected Discharge Plan and Services In-house Referral: NA Discharge Planning Services: CM Consult Post Acute Care Choice: Resumption of Svcs/PTA Provider Living arrangements for the past 2 months: Single Family Home Expected Discharge Date: 07/01/24               DME Arranged: N/A         HH Arranged: PT HH Agency: [facility] (Patient is Active with [facility]) Date HH Agency Contacted: 07/01/24 Time HH Agency Contacted: 1130 Representative spoke with at [facility] Agency: Joane   Social Determinants of Health (SDOH) Interventions SDOH Screenings   Food Insecurity: No Food Insecurity (06/24/2024)  Housing: Low Risk  (06/24/2024)  Transportation Needs: No Transportation Needs (06/24/2024)  Utilities: Not At Risk (06/24/2024)  Alcohol Screen: Low Risk  (05/25/2024)  Depression (PHQ2-9): Medium Risk (05/25/2024)  Financial Resource Strain: Low Risk  (06/14/2024)  Physical Activity: Inactive (06/14/2024)  Social Connections: Moderately Isolated (06/24/2024)  Stress: Stress Concern Present (06/14/2024)  Tobacco Use: Medium Risk (06/24/2024)  Health Literacy: Adequate Health Literacy (05/25/2024)    Readmission Risk Interventions    01/15/2024    1:16 PM  Readmission Risk Prevention Plan  Transportation Screening Complete  PCP or Specialist  Appt within 5-7 Days Complete  Home Care Screening Complete  Medication Review (RN CM) Complete

## 2024-07-01 NOTE — Discharge Summary (Signed)
 Physician Discharge Summary  [name]. FMW:[id number] DOB: Apr 21, 1941 DOA: 06/24/2024  PCP: [name] HERO, MD  Admit date: 06/24/2024 Discharge date: 07/01/2024  Admitted From: (Home) Disposition:  (Home)  Recommendations for Outpatient Follow-up:  Follow up with PCP in 1-2 weeks Please obtain BMP/CBC in one week Instructed to schedule a follow-up with his bariatric surgeon if his Lap-Band contributing to his symptoms.   Diet recommendation: Heart Healthy  Brief/Interim Summary:  83 y.o. male with medical history significant of HFpEF (EF 55-60%in 10/2021), CAD s/p multiple PCIs, HTN, HLD, DM2, chronic respiratory failure 2/2 COPD (on 2L [place]), CKD, remote PE in 2006 and PVD p/w lethargy and found to have dehydration, AKI 2/2 overdiuresis and possible UTI.    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  Responded well to IV fluids for hydration, nonacute renal ultrasound, this problem has resolved.   -Says in the setting of overdiuresis and dehydration, as he was instructed to double his torsemide  dosing and started on Zaroxolyn  as well. - On discharge, renal function back to baseline and he was instructed to go back on his home dose torsemide  and to stop Zaroxolyn .   acute cystitis - Urine culture growing Staph aureus, he was on IV cefazolin  during hospital stay, will discharge on p.o. Keflex  for total of 7 days treatment    Chest pain - Atypical likely due to underlying dry cough, troponin negative, chest pain-free now   HTN - Resume home regimen on discharge   Acute on chronic diastolic CHF 06/30/2024.  EF 60% on last echocardiogram.  - This is in the setting of holding diuresis and IV fluids, improved with IV Lasix  yesterday, he is euvolemic at time of discharge, he is instructed to continue with home dose torsemide  60 mg daily .    HLD -PTA Crestor    COPD -Breo Ellipta  daily + Duonebs     Ongoing right foot pain.  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    -Much improved after few doses of steroids, placed on colchicine , x-ray stable.  Continue with home dose allopurinol , and to take colchicine  x 3 doses only as an outpatient   Chronic dysphagia present on admission.   -Speech therapy following, evidence of diffuse esophageal spasm on barium swallow, per speech we are doing a barium swallow on 06/29/2024, GI also following the event EGD on 06/28/2024 suggesting possible esophagitis and possible mild Candida infection, placed on Diflucan , and Protonix  40 mg p.o. twice daily, to finish another 11 days of p.o. Diflucan  as an outpatient, he was dispensed Protonix  twice daily at time of discharge. -Patient with known Lap-Band, which was totally deflated in January, I have instructed him to follow-up with bariatric surgery as an outpatient to see if his Lap-Band can be removed as it might be contributing to his symptoms   DM type II.  Resume home regimen    Discharge Diagnoses:  Active Problems:   AKI (acute kidney injury) (HCC)   Abnormal barium swallow    Discharge Instructions  Discharge Instructions     Diet - low sodium heart healthy   Complete by: As directed    Discharge instructions   Complete by: As directed    Follow with Primary MD [name] HERO, MD in 7 days   Get CBC, CMP,  checked  by Primary MD next visit.    Activity: As tolerated with Full fall precautions use walker/cane & assistance as needed   Disposition Home    Diet: Heart  Healthy /carb mosidied  For Heart failure patients - Check your Weight same time everyday, if you gain over 2 pounds, or you develop in leg swelling, experience more shortness of breath or chest pain, call your Primary MD immediately. Follow Cardiac Low Salt Diet and 1.5 lit/day fluid restriction.   On your next visit with your primary care physician please Get Medicines reviewed and adjusted.   Please request your Prim.MD to go over all Hospital Tests and Procedure/Radiological results at  the follow up, please get all Hospital records sent to your Prim MD by signing hospital release before you go home.   If you experience worsening of your admission symptoms, develop shortness of breath, life threatening emergency, suicidal or homicidal thoughts you must seek medical attention immediately by calling 911 or calling your MD immediately  if symptoms less severe.  You Must read complete instructions/literature along with all the possible adverse reactions/side effects for all the Medicines you take and that have been prescribed to you. Take any new Medicines after you have completely understood and accpet all the possible adverse reactions/side effects.   Do not drive, operating heavy machinery, perform activities at heights, swimming or participation in water activities or provide baby sitting services if your were admitted for syncope or siezures until you have seen by Primary MD or a Neurologist and advised to do so again.  Do not drive when taking Pain medications.    Do not take more than prescribed Pain, Sleep and Anxiety Medications  Special Instructions: If you have smoked or chewed Tobacco  in the last 2 yrs please stop smoking, stop any regular Alcohol  and or any Recreational drug use.  Wear Seat belts while driving.   Please note  You were cared for by a hospitalist during your hospital stay. If you have any questions about your discharge medications or the care you received while you were in the hospital after you are discharged, you can call the unit and asked to speak with the hospitalist on call if the hospitalist that took care of you is not available. Once you are discharged, your primary care physician will handle any further medical issues. Please note that NO REFILLS for any discharge medications will be authorized once you are discharged, as it is imperative that you return to your primary care physician (or establish a relationship with a primary care physician  if you do not have one) for your aftercare needs so that they can reassess your need for medications and monitor your lab values.   Increase activity slowly   Complete by: As directed       Allergies as of 07/01/2024       Reactions   Enalapril Maleate Cough   REACTION: cough   Lisinopril Cough   Shellfish-derived Products Swelling   Said occurred twice; has eaten some since and had no reactions   Iodine-kelp [iodine] Other (See Comments)   Other reaction(s): Unknown        Medication List     STOP taking these medications    metolazone  5 MG tablet Commonly known as: ZAROXOLYN        TAKE these medications    acetaminophen  500 MG tablet Commonly known as: TYLENOL  Take 500-1,000 mg by mouth every 6 (six) hours as needed for moderate pain (pain score 4-6).   albuterol  108 (90 Base) MCG/ACT inhaler Commonly known as: VENTOLIN  HFA Inhale 2 puffs into the lungs every 6 (six) hours as needed for wheezing or shortness  of breath.   allopurinol  300 MG tablet Commonly known as: ZYLOPRIM  TAKE 1 TABLET BY MOUTH DAILY   apixaban  2.5 MG Tabs tablet Commonly known as: ELIQUIS  Take 1 tablet (2.5 mg total) by mouth 2 (two) times daily.   Azelastine  HCl 137 MCG/SPRAY Soln Place 2 puffs into the nose every 12 (twelve) hours as needed (Nasal congestion).   calcitRIOL  0.25 MCG capsule Commonly known as: ROCALTROL  TAKE 1 CAPSULE BY MOUTH DAILY   carvedilol  25 MG tablet Commonly known as: COREG  TAKE 1 TABLET BY MOUTH TWICE  DAILY WITH MEALS   cephALEXin  500 MG capsule Commonly known as: KEFLEX  Take 1 capsule (500 mg total) by mouth 2 (two) times daily for 2 days.   cetirizine 10 MG tablet Commonly known as: ZYRTEC Take 10 mg by mouth as needed for allergies.   cholecalciferol  25 MCG (1000 UNIT) tablet Commonly known as: VITAMIN D3 Take 1,000 Units by mouth daily.   citalopram  10 MG tablet Commonly known as: CELEXA  TAKE 1 TABLET BY MOUTH DAILY   colchicine  0.6 MG  tablet Take 1 tablet (0.6 mg total) by mouth every other day for 3 doses.   diphenhydrAMINE 25 MG tablet Commonly known as: BENADRYL Take 25 mg by mouth daily as needed for itching, allergies or sleep.   Farxiga  10 MG Tabs tablet Generic drug: dapagliflozin  propanediol TAKE 1 TABLET BY MOUTH DAILY.   ferrous sulfate  324 MG Tbec Take 324 mg by mouth daily at 2 PM.   finasteride  5 MG tablet Commonly known as: PROSCAR  TAKE 1 TABLET(5 MG) BY MOUTH DAILY   fluconazole  200 MG tablet Commonly known as: DIFLUCAN  Take 1 tablet (200 mg total) by mouth daily. Start taking on: July 02, 2024   fluticasone  50 MCG/ACT nasal spray Commonly known as: FLONASE  Place 2 sprays into both nostrils daily. What changed:  when to take this reasons to take this   guaiFENesin  600 MG 12 hr tablet Commonly known as: MUCINEX  Take 2 tablets (1,200 mg total) by mouth 2 (two) times daily. What changed:  when to take this reasons to take this   hydrocortisone  2.5 % cream Apply 1 application  topically daily as needed (Irritation).   insulin  aspart 100 UNIT/ML injection Commonly known as: novoLOG  Inject 7-9 Units into the skin 3 (three) times daily before meals. What changed: how much to take   isosorbide  mononitrate 60 MG 24 hr tablet Commonly known as: IMDUR  TAKE 1 TABLET BY MOUTH DAILY   ketoconazole 2 % cream Commonly known as: NIZORAL Apply 1 application topically daily as needed for irritation.   MULTIVITAMIN PO Take 1 tablet by mouth daily.   nystatin cream Commonly known as: MYCOSTATIN Apply 1 application  topically 2 (two) times daily as needed for dry skin.   OXYGEN  Inhale 2 L into the lungs at bedtime.   pantoprazole  40 MG tablet Commonly known as: PROTONIX  Take 1 tablet (40 mg total) by mouth 2 (two) times daily before a meal. What changed: when to take this   polyethylene glycol powder 17 GM/SCOOP powder Commonly known as: GLYCOLAX/MIRALAX Take 17 g by mouth daily as  needed (constipation).   potassium chloride  10 MEQ tablet Commonly known as: KLOR-CON  Take 2 tablets (20 mEq total) by mouth daily.   rosuvastatin  20 MG tablet Commonly known as: CRESTOR  Take 1 tablet (20 mg total) by mouth daily.   Spiriva  Respimat 2.5 MCG/ACT Aers Generic drug: Tiotropium Bromide  Monohydrate Inhale 1 puff into the lungs daily.   torsemide  20  MG tablet Commonly known as: DEMADEX  Take 3 tablets (60 mg total) by mouth daily. What changed: how much to take   traZODone  100 MG tablet Commonly known as: DESYREL  TAKE 1 TABLET BY MOUTH AT  BEDTIME   Trulicity  4.5 MG/0.5ML Soaj Generic drug: Dulaglutide  Inject 4.5 mg as directed once a week.   UNJURY UNFLAVORED PO Take 8-16 oz by mouth daily.   VITAMIN B COMPLEX  PO Take 1 tablet by mouth daily.   Wixela Inhub 250-50 MCG/ACT Aepb Generic drug: fluticasone -salmeterol Inhale 1 puff into the lungs in the morning and at bedtime.        Follow-up Information     [facility], [facility] Follow up.   Specialty: Home Health Services Why: Physical therapy. Office will call to arrange follow up after hospital discharge. Contact information: [address] [place] [address] [phone]                Allergies  Allergen Reactions   Enalapril Maleate Cough    REACTION: cough   Lisinopril Cough   Shellfish-Derived Products Swelling    Said occurred twice; has eaten some since and had no reactions   Iodine-Kelp [Iodine] Other (See Comments)    Other reaction(s): Unknown    Consultations: GI   Procedures/Studies: DG Chest Port 1 View Result Date: 06/30/2024 CLINICAL DATA:  141880 SOB (shortness of breath) [id number] EXAM: PORTABLE CHEST - 1 VIEW COMPARISON:  June 25, 2024, June 24, 2024 FINDINGS: Mild central pulmonary vascular congestion. No focal airspace consolidation or pneumothorax. Blunting of both costophrenic sulci, possibly trace effusions. Mild cardiomegaly. Tortuous aorta with  aortic atherosclerosis. No acute fracture or destructive lesions. Multilevel thoracic osteophytosis. IMPRESSION: Mild central pulmonary vascular congestion and trace bilateral pleural effusions. Electronically Signed   By: Rogelia Myers M.D.   On: 06/30/2024 08:13   DG Foot Complete Right Result Date: 06/27/2024 CLINICAL DATA:  Right foot pain. EXAM: RIGHT FOOT COMPLETE - 3+ VIEW COMPARISON:  Radiographs 06/24/2024 and 06/02/2011. FINDINGS: The bones appear mildly demineralized. No evidence of acute fracture or dislocation. No evidence of bone destruction. Stable mild degenerative changes for age, primarily at the 1st metatarsophalangeal joint. Scattered vascular calcifications and dorsal subcutaneous edema are unchanged from the recent radiographs. IMPRESSION: Stable right foot radiographs without acute osseous findings. Electronically Signed   By: Elsie Perone M.D.   On: 06/27/2024 11:41   US  RENAL Result Date: 06/25/2024 CLINICAL DATA:  Acute kidney injury. EXAM: RENAL / URINARY TRACT ULTRASOUND COMPLETE COMPARISON:  Renal ultrasound 05/30/2022 FINDINGS: Right Kidney: Renal measurements: 9.6 x 5 x 4.6 cm = volume: 106 mL. Thinning of the renal parenchyma with increased echogenicity. No hydronephrosis. The renal cyst on prior exam is not well-defined currently. No visible stone or focal lesion. Left Kidney: Renal measurements: 11 x 5.6 x 4.3 cm = volume: 140 mL. Thinning of the renal parenchyma with increased echogenicity. No hydronephrosis. 2.2 cm cyst arising from the upper pole. Some of the additional cysts on prior exam are not well-defined. Bladder: Appears normal for degree of bladder distention. Other: None. IMPRESSION: 1. Thinning of bilateral renal parenchyma with increased echogenicity consistent with chronic medical renal disease. 2. No obstructive uropathy. 3. Left renal cyst. Additional cysts on prior ultrasound are not seen on the current exam. Electronically Signed   By: Andrea Gasman  M.D.   On: 06/25/2024 14:39   DG ESOPHAGUS W SINGLE CM (SOL OR THIN BA) Result Date: 06/25/2024 CLINICAL DATA:  Globus sensation. Coughing.  Esophageal reflux. The patient feels like something is stuck in the mid to lower chest. EXAM: ESOPHAGUS/BARIUM SWALLOW/TABLET STUDY TECHNIQUE: Single contrast examination was performed using thin liquid barium. This exam was performed by Laymon Coast, NP, and was supervised and interpreted by Dr. Dasie Hamburg. FLUOROSCOPY: Radiation Exposure Index (as provided by the fluoroscopic device): 27.5 mGy Kerma COMPARISON:  None Available. FINDINGS: Swallowing: Vestibular penetration without aspiration seen. Pharynx: Unremarkable. Esophagus: No definite mass or stricture. Mild dilatation of the proximal esophagus. Esophageal motility: Frequent non-propulsive/tertiary contractions, particularly within the mid and distal esophagus seen consistently with swallows. No reported associated pain. Hiatal Hernia: None. Gastroesophageal reflux: None visualized. Ingested 13 mm barium tablet: The barium tablet slowly traversed from pharynx to distal esophagus and then became stuck in the distal esophagus just above the gastroesophageal junction. IMPRESSION: 1. Frequent, prominent tertiary contractions suggestive of diffuse esophageal spasm. Mild dilatation of the proximal esophagus. 2. Barium tablet became stuck in the distal esophagus. 3. Vestibular penetration without aspiration. Electronically Signed   By: Dasie Hamburg M.D.   On: 06/25/2024 14:28   DG Chest Port 1 View Result Date: 06/25/2024 CLINICAL DATA:  83 year old male with shortness of breath. EXAM: PORTABLE CHEST 1 VIEW COMPARISON:  Chest radiographs yesterday and earlier. FINDINGS: Portable AP semi upright view at 0621 hours. Stable cardiomegaly and mediastinal contours. Stable lung volumes. Visualized tracheal air column is within normal limits. Small right pleural effusion again suspected at the costophrenic angle. Similar  appearance on prior chest CT 09/09/2018. Otherwise when allowing for portable technique the lungs are clear. Negative visible bowel gas. No acute osseous abnormality identified. IMPRESSION: Small right pleural effusion, probably chronic. Otherwise no acute cardiopulmonary abnormality. Electronically Signed   By: VEAR Hurst M.D.   On: 06/25/2024 06:37   CT Cervical Spine Wo Contrast Result Date: 06/24/2024 CLINICAL DATA:  83 year old male with dizziness and weakness for 1 week. EXAM: CT CERVICAL SPINE WITHOUT CONTRAST TECHNIQUE: Multidetector CT imaging of the cervical spine was performed without intravenous contrast. Multiplanar CT image reconstructions were also generated. RADIATION DOSE REDUCTION: This exam was performed according to the departmental dose-optimization program which includes automated exposure control, adjustment of the mA and/or kV according to patient size and/or use of iterative reconstruction technique. COMPARISON:  Head CT today. FINDINGS: Alignment: Mild straightening of cervical lordosis. Cervicothoracic junction alignment is within normal limits. Bilateral posterior element alignment is within normal limits. Skull base and vertebrae: Bone mineralization is within normal limits for age. Visualized skull base is intact. No atlanto-occipital dissociation. C1 and C2 appear intact and aligned. No acute osseous abnormality identified. Soft tissues and spinal canal: No prevertebral fluid or swelling. No visible canal hematoma. Bulky calcified cervical carotid atherosclerosis, especially on the left. Otherwise negative noncontrast visible neck soft tissues. Disc levels: Cervical spine degeneration, largely age-appropriate. Possible developing degenerative facet ankylosis at C2-C3, C4-C5 on the right. Upper chest: Visible upper thoracic levels appear intact. Negative lung apices. IMPRESSION: 1. No acute traumatic injury identified in the cervical spine. 2. Bulky calcified cervical carotid  atherosclerosis. Electronically Signed   By: VEAR Hurst M.D.   On: 06/24/2024 09:43   CT Head Wo Contrast Result Date: 06/24/2024 CLINICAL DATA:  83 year old male with dizziness and weakness for 1 week. EXAM: CT HEAD WITHOUT CONTRAST TECHNIQUE: Contiguous axial images were obtained from the base of the skull through the vertex without intravenous contrast. RADIATION DOSE REDUCTION: This exam was performed according to the departmental dose-optimization program which includes automated exposure control, adjustment of the  mA and/or kV according to patient size and/or use of iterative reconstruction technique. COMPARISON:  Brain MRI 02/19/2006.  Head CT 06/11/2011. FINDINGS: Brain: Cerebral volume loss since 2012 appears to be generalized and age-appropriate. No midline shift, ventriculomegaly, mass effect, evidence of mass lesion, intracranial hemorrhage or evidence of cortically based acute infarction. Largely normal for age gray-white differentiation. No convincing encephalomalacia. Vascular: Advanced Calcified atherosclerosis at the skull base. No suspicious intracranial vascular hyperdensity. Skull: Intact.  No acute osseous abnormality identified. Sinuses/Orbits: Visualized paranasal sinuses and mastoids are stable and well aerated. Other: Calcified scalp vessel atherosclerosis. No acute orbit or scalp soft tissue finding. IMPRESSION: 1. No acute intracranial abnormality. Negative for age noncontrast CT appearance of the brain. 2. Advanced calcified atherosclerosis. Electronically Signed   By: VEAR Hurst M.D.   On: 06/24/2024 09:40   DG Foot Complete Right Result Date: 06/24/2024 CLINICAL DATA:  Pain. EXAM: RIGHT FOOT COMPLETE - 3+ VIEW COMPARISON:  06/12/2011. FINDINGS: There is no evidence of acute fracture or dislocation. Mild first MTP and second through fifth interphalangeal joint space narrowing. Small plantar calcaneal spur. Peripheral vascular calcifications are present. Subcutaneous edema of the dorsal  foot. IMPRESSION: No acute osseous abnormality. Electronically Signed   By: Harrietta Sherry M.D.   On: 06/24/2024 09:05   DG Chest 2 View Result Date: 06/24/2024 CLINICAL DATA:  Chest pain.  Weakness. EXAM: CHEST - 2 VIEW COMPARISON:  01/10/2024 FINDINGS: Hyperinflation. Midline trachea. Moderate cardiomegaly. Trace right pleural fluid or thickening. Mild degradation secondary to patient body habitus and AP frontal technique. Recurrent or persistent right base airspace disease. Mild left base volume loss and atelectasis. No congestive failure. IMPRESSION: Mild limitations secondary to AP frontal technique and patient body habitus. Persistent or recurrent right base airspace disease and trace right pleural fluid or thickening. This could represent recurrent pneumonia or pleuroparenchymal scarring, given similarity back to 01/10/2024. Appearance is new since 11/07/2021. Hyperinflation, suggesting COPD. Cardiomegaly without congestive failure. Electronically Signed   By: Rockey Kilts M.D.   On: 06/24/2024 09:03      Subjective: No significant events overnight  Discharge Exam: Vitals:   07/01/24 0348 07/01/24 0740  BP: 120/67 124/64  Pulse: 85 61  Resp: 20   Temp: 98.1 F (36.7 C) (!) 97.5 F (36.4 C)  SpO2: 97%    Vitals:   06/30/24 2011 07/01/24 0017 07/01/24 0348 07/01/24 0740  BP: (!) 115/52 137/69 120/67 124/64  Pulse: 96 84 85 61  Resp: 20 20 20    Temp: 97.8 F (36.6 C) 98.2 F (36.8 C) 98.1 F (36.7 C) (!) 97.5 F (36.4 C)  TempSrc: Oral Oral Oral Oral  SpO2: 96% 100% 97%   Weight:      Height:        General: Pt is alert, awake, not in acute distress Cardiovascular: RRR, S1/S2 +, no rubs, no gallops Respiratory: CTA bilaterally, no wheezing, no rhonchi Abdominal: Soft, NT, ND, bowel sounds + Extremities: no edema, no cyanosis    The results of significant diagnostics from this hospitalization (including imaging, microbiology, ancillary and laboratory) are listed  below for reference.     Microbiology: Recent Results (from the past 240 hours)  Urine Culture     Status: Abnormal   Collection Time: 06/24/24  9:09 AM   Specimen: Urine, Random  Result Value Ref Range Status   Specimen Description URINE, RANDOM  Final   Special Requests   Final    NONE Reflexed from [id number] Performed at [facility]  Lab, [address], [place], [place] [address]    Culture 80,000 COLONIES/mL STAPHYLOCOCCUS AUREUS (A)  Final   Report Status 06/26/2024 FINAL  Final   Organism ID, Bacteria STAPHYLOCOCCUS AUREUS (A)  Final      Susceptibility   Staphylococcus aureus - MIC*    CIPROFLOXACIN  <=0.5 SENSITIVE Sensitive     GENTAMICIN <=0.5 SENSITIVE Sensitive     NITROFURANTOIN  <=16 SENSITIVE Sensitive     OXACILLIN 0.5 SENSITIVE Sensitive     TETRACYCLINE <=1 SENSITIVE Sensitive     VANCOMYCIN  <=0.5 SENSITIVE Sensitive     TRIMETH/SULFA <=10 SENSITIVE Sensitive     RIFAMPIN <=0.5 SENSITIVE Sensitive     Inducible Clindamycin NEGATIVE Sensitive     LINEZOLID 4 SENSITIVE Sensitive     * 80,000 COLONIES/mL STAPHYLOCOCCUS AUREUS  Urine Culture (for pregnant, neutropenic or urologic patients or patients with an indwelling urinary catheter)     Status: Abnormal   Collection Time: 06/25/24 11:20 AM   Specimen: Urine, Clean Catch  Result Value Ref Range Status   Specimen Description URINE, CLEAN CATCH  Final   Special Requests   Final    NONE Performed at [facility] Lab, [address] [place], [place] [address]    Culture 20,000 COLONIES/mL STAPHYLOCOCCUS AUREUS (A)  Final   Report Status 06/27/2024 FINAL  Final   Organism ID, Bacteria STAPHYLOCOCCUS AUREUS (A)  Final      Susceptibility   Staphylococcus aureus - MIC*    CIPROFLOXACIN  <=0.5 SENSITIVE Sensitive     GENTAMICIN <=0.5 SENSITIVE Sensitive     NITROFURANTOIN  <=16 SENSITIVE Sensitive     OXACILLIN <=0.25 SENSITIVE Sensitive     TETRACYCLINE <=1 SENSITIVE Sensitive     VANCOMYCIN  <=0.5 SENSITIVE  Sensitive     TRIMETH/SULFA <=10 SENSITIVE Sensitive     RIFAMPIN <=0.5 SENSITIVE Sensitive     Inducible Clindamycin NEGATIVE Sensitive     LINEZOLID 2 SENSITIVE Sensitive     * 20,000 COLONIES/mL STAPHYLOCOCCUS AUREUS     Labs: BNP (last 3 results) Recent Labs    06/24/24 0825 06/30/24 1029 07/01/24 0450  BNP 97.1 772.3* 1,008.1*   Basic Metabolic Panel: Recent Labs  Lab 06/26/24 0502 06/27/24 0429 06/28/24 0439 06/29/24 0516 06/30/24 1029 07/01/24 0450  NA 138 133* 135 132* 134* 134*  K 3.5 4.1 4.3 4.4 4.1 3.6  CL 90* 91* 92* 91* 92* 96*  CO2 35* 31 34* 29 28 31   GLUCOSE 125* 198* 158* 286* 308* 65*  BUN 65* 54* 57* 74* 78* 67*  CREATININE 1.73* 1.63* 1.61* 1.70* 1.77* 1.82*  CALCIUM  9.4 9.6 9.6 8.9 8.7* 8.1*  MG 2.4 2.1 2.3 2.3 2.1 2.1  PHOS 3.4 3.1 3.9 4.0  --   --    Liver Function Tests: No results for input(s): AST, ALT, ALKPHOS, BILITOT, PROT, ALBUMIN  in the last 168 hours. No results for input(s): LIPASE, AMYLASE in the last 168 hours. No results for input(s): AMMONIA in the last 168 hours. CBC: Recent Labs  Lab 06/27/24 0429 06/28/24 0439 06/29/24 0516 06/30/24 1029 07/01/24 0450  WBC 11.2* 8.5 8.5 11.6* 8.8  NEUTROABS 7.9* 7.6 7.3 8.6* 5.5  HGB 13.4 12.5* 12.5* 13.0 12.6*  HCT 40.8 37.2* 38.0* 40.2 38.5*  MCV 93.4 92.5 93.4 94.8 93.7  PLT 166 148* 181 201 207   Cardiac Enzymes: No results for input(s): CKTOTAL, CKMB, CKMBINDEX, TROPONINI in the last 168 hours. BNP: Invalid input(s): POCBNP CBG: Recent Labs  Lab 06/30/24 1244 06/30/24 1545 06/30/24  2047 07/01/24 0741 07/01/24 0816  GLUCAP 347* 282* 191* 63* 118*   D-Dimer No results for input(s): DDIMER in the last 72 hours. Hgb A1c No results for input(s): HGBA1C in the last 72 hours. Lipid Profile No results for input(s): CHOL, HDL, LDLCALC, TRIG, CHOLHDL, LDLDIRECT in the last 72 hours. Thyroid  function studies No results for  input(s): TSH, T4TOTAL, T3FREE, THYROIDAB in the last 72 hours.  Invalid input(s): FREET3 Anemia work up No results for input(s): VITAMINB12, FOLATE, FERRITIN, TIBC, IRON, RETICCTPCT in the last 72 hours. Urinalysis    Component Value Date/Time   COLORURINE YELLOW 06/24/2024 0909   APPEARANCEUR HAZY (A) 06/24/2024 0909   LABSPEC 1.007 06/24/2024 0909   PHURINE 6.0 06/24/2024 0909   GLUCOSEU >=500 (A) 06/24/2024 0909   GLUCOSEU >=1000 (A) 09/20/2023 0945   HGBUR NEGATIVE 06/24/2024 0909   BILIRUBINUR NEGATIVE 06/24/2024 0909   BILIRUBINUR neg 12/13/2023 0852   KETONESUR NEGATIVE 06/24/2024 0909   PROTEINUR NEGATIVE 06/24/2024 0909   UROBILINOGEN negative (A) 12/13/2023 0852   UROBILINOGEN 0.2 09/20/2023 0945   NITRITE NEGATIVE 06/24/2024 0909   LEUKOCYTESUR LARGE (A) 06/24/2024 0909   Sepsis Labs Recent Labs  Lab 06/28/24 0439 06/29/24 0516 06/30/24 1029 07/01/24 0450  WBC 8.5 8.5 11.6* 8.8   Microbiology Recent Results (from the past 240 hours)  Urine Culture     Status: Abnormal   Collection Time: 06/24/24  9:09 AM   Specimen: Urine, Random  Result Value Ref Range Status   Specimen Description URINE, RANDOM  Final   Special Requests   Final    NONE Reflexed from [id number] Performed at [facility], [address], [place], [place] [address]    Culture 80,000 COLONIES/mL STAPHYLOCOCCUS AUREUS (A)  Final   Report Status 06/26/2024 FINAL  Final   Organism ID, Bacteria STAPHYLOCOCCUS AUREUS (A)  Final      Susceptibility   Staphylococcus aureus - MIC*    CIPROFLOXACIN  <=0.5 SENSITIVE Sensitive     GENTAMICIN <=0.5 SENSITIVE Sensitive     NITROFURANTOIN  <=16 SENSITIVE Sensitive     OXACILLIN 0.5 SENSITIVE Sensitive     TETRACYCLINE <=1 SENSITIVE Sensitive     VANCOMYCIN  <=0.5 SENSITIVE Sensitive     TRIMETH/SULFA <=10 SENSITIVE Sensitive     RIFAMPIN <=0.5 SENSITIVE Sensitive     Inducible Clindamycin NEGATIVE Sensitive     LINEZOLID 4  SENSITIVE Sensitive     * 80,000 COLONIES/mL STAPHYLOCOCCUS AUREUS  Urine Culture (for pregnant, neutropenic or urologic patients or patients with an indwelling urinary catheter)     Status: Abnormal   Collection Time: 06/25/24 11:20 AM   Specimen: Urine, Clean Catch  Result Value Ref Range Status   Specimen Description URINE, CLEAN CATCH  Final   Special Requests   Final    NONE Performed at [facility], [address], [place], [place] [address]    Culture 20,000 COLONIES/mL STAPHYLOCOCCUS AUREUS (A)  Final   Report Status 06/27/2024 FINAL  Final   Organism ID, Bacteria STAPHYLOCOCCUS AUREUS (A)  Final      Susceptibility   Staphylococcus aureus - MIC*    CIPROFLOXACIN  <=0.5 SENSITIVE Sensitive     GENTAMICIN <=0.5 SENSITIVE Sensitive     NITROFURANTOIN  <=16 SENSITIVE Sensitive     OXACILLIN <=0.25 SENSITIVE Sensitive     TETRACYCLINE <=1 SENSITIVE Sensitive     VANCOMYCIN  <=0.5 SENSITIVE Sensitive     TRIMETH/SULFA <=10 SENSITIVE Sensitive     RIFAMPIN <=0.5 SENSITIVE Sensitive  Inducible Clindamycin NEGATIVE Sensitive     LINEZOLID 2 SENSITIVE Sensitive     * 20,000 COLONIES/mL STAPHYLOCOCCUS AUREUS     Time coordinating discharge: Over 30 minutes  SIGNED:   Brayton Lye, MD  [facility] 07/01/2024, 11:24 AM Pager   If 7PM-7AM, please contact night-coverage [email or web address] Password [email or web address]

## 2024-07-01 NOTE — Plan of Care (Signed)
  Problem: Coping: Goal: Ability to adjust to condition or change in health will improve Outcome: Progressing   Problem: Clinical Measurements: Goal: Will remain free from infection Outcome: Progressing Goal: Respiratory complications will improve Outcome: Progressing   Problem: Activity: Goal: Risk for activity intolerance will decrease Outcome: Progressing

## 2024-07-01 NOTE — Procedures (Addendum)
 Modified Barium Swallow Study  Patient Details  Name: [name]. MRN: [id number] Date of Birth: 02-Jan-1941  Today's Date: 07/01/2024  Modified Barium Swallow completed.  Full report located under Chart Review in the Imaging Section.  History of Present Illness [name]. is a 83 y.o. male with medical history significant of HFpEF (EF 55-60%in 10/2021), CAD s/p multiple PCIs, HTN, HLD, DM2, chronic respiratory failure 2/2 COPD (on 2L [place]), CKD, remote PE in 2006 and PVD p/w lethargy and found to have AKI 2/2 overdiuresis and possible UTI. Pt underwent EGD + dilation on 06/28/24.   Clinical Impression  Pt presents with a mild pharyngeal dysphagia and a primary esophageal dysphagia per results of MBSS completed today. No aspiration observed during study.   Oral phase judged to be grossly WNL. There was posterior spillage of thin and nectar-thick liquids to the level of the pyriform sinuses before the swallow, which is WNL for pt's age. Trace BOT coating observed post sips of liquids.   Pharyngeal deficits included reduced hyolaryngeal elevation/excursion, reduced laryngeal vestibule closure, and mildly reduced pharyngeal stripping with scant, transient penetration of thin and nectar-thick liquids by straw observed before/during the swallow.   Esophageal deficits included reduced esophageal clearance of all consistencies (thin, nectar, pudding), with increased retention of residue as trials progressed and as viscosity increased. Eventually, there was a large amount of liquid and pudding residue sitting in the upper esophagus with backflow below the UES. Pt reported globus sensation of residue. Poor esophageal clearance does elevate pt's postprandial aspiration risk. Image below:      Recommend continue regular consistency diet with selection of softer foods. Continue thin liquids and PO meds as tolerated. Recommend outpatient GI follow up, [facility] SLP consult as new research has shown  promise for positive effects of respiratory exercises and methods aiding bolus flow of liquids and solids through the esophagus (DIEZ method). Recommend reflux precautions and eating smaller meals throughout the day vs large meals.   Education/Tx: SLP reviewed MBSS findings and recommendations with pt and wife (via phone) in great detail following the study. SLP provided written handout with all information. Pt verbalized understanding and all questions pertaining to swallowing were answered at this time.   Factors that may increase risk of adverse event in presence of aspiration [name] 2021): Frail or deconditioned;Limited mobility  Swallow Evaluation Recommendations Recommendations: PO diet PO Diet Recommendation: Dysphagia 3 (Mechanical soft);Regular;Thin liquids (Level 0) (select softer foods) Liquid Administration via: Cup;Straw Medication Administration: Whole meds with liquid (as tolerated) Supervision: Patient able to self-feed Swallowing strategies  : Slow rate;Small bites/sips;effortful swallow;Follow solids with liquids Postural changes: Position pt fully upright for meals;Stay upright 30-60 min after meals;Out of bed for meals Oral care recommendations: Oral care BID (2x/day) Recommended consults: Consider GI consultation (follow up with outpatient GI - consider esophageal manometry)      [name] 07/01/2024,11:13 AM

## 2024-07-01 NOTE — Evaluation (Signed)
 Occupational Therapy Evaluation Patient Details Name: [name]. MRN: [id number] DOB: 1941/01/11 Today's Date: 07/01/2024   History of Present Illness   Pt is an 83 y/o male admitted 7/2 with lethargy and found to have AKI due to over-diuresis and possible UTI.  His Course complicated by a R forefoot/foot sprain making in difficult to mobilize.  EFY:RNEI, chronic respiratory failure, diabetes mellitus, CAD, PAF on Eliquis , history of CVA with left-sided weakness, and chronic HFpEF, Lap-Band     Clinical Impressions Patient lives with his wife in a 1 level home with ramped entry.  Patient reports that he uses RW intermittently when need in home and uses transport chair when in community (which his grandson pushes).  He reports that he is Independent at baseline for ADLs and wife completes IADLs.  Patient currently presents with increased weakness, activity tolerance and decreased independence in in ADL tasks.  Patient requires max A for donning socks and CGA for functional mobility with RW.  Patient would benefit from additional OT intervention to address functional deficits of listed below in order for patient to return to PLOF and would benefit from [facility].     If plan is discharge home, recommend the following:   Assistance with cooking/housework;A little help with walking and/or transfers;Assist for transportation;Help with stairs or ramp for entrance     Functional Status Assessment   Patient has had a recent decline in their functional status and demonstrates the ability to make significant improvements in function in a reasonable and predictable amount of time.     Equipment Recommendations         Recommendations for Other Services         Precautions/Restrictions   Precautions Precautions: Fall Recall of Precautions/Restrictions: Intact Restrictions Weight Bearing Restrictions Per Provider Order: No     Mobility Bed Mobility               General  bed mobility comments: OOB on arrival    Transfers Overall transfer level: Needs assistance Equipment used: Rolling walker (2 wheels) Transfers: Sit to/from Stand Sit to Stand: Supervision           General transfer comment: appropriate use of UE's      Balance Overall balance assessment: Needs assistance Sitting-balance support: No upper extremity supported, Feet supported Sitting balance-Leahy Scale: Fair         Standing balance comment: reliant on walker                           ADL either performed or assessed with clinical judgement   ADL Overall ADL's : Needs assistance/impaired Eating/Feeding: Independent   Grooming: Wash/dry face;Wash/dry hands;Set up   Upper Body Bathing: Minimal assistance;Sitting   Lower Body Bathing: Maximal assistance Lower Body Bathing Details (indicate cue type and reason): patient reports that he usually props LEs onto loveseat and is able to don socks on that way Upper Body Dressing : Set up   Lower Body Dressing: Moderate assistance   Toilet Transfer: Contact guard assist   Toileting- Clothing Manipulation and Hygiene: Minimal assistance       Functional mobility during ADLs: Contact guard assist General ADL Comments: with RW     Vision Baseline Vision/History: 1 Wears glasses Ability to See in Adequate Light: 0 Adequate Patient Visual Report: No change from baseline Vision Assessment?: No apparent visual deficits     Perception         Praxis  Pertinent Vitals/Pain Pain Assessment Faces Pain Scale: Hurts even more Pain Location: back Pain Descriptors / Indicators: Aching     Extremity/Trunk Assessment Upper Extremity Assessment Upper Extremity Assessment: Generalized weakness   Lower Extremity Assessment Lower Extremity Assessment: Defer to PT evaluation   Cervical / Trunk Assessment Cervical / Trunk Assessment: Normal   Communication Communication Communication: No apparent  difficulties   Cognition Arousal: Alert Behavior During Therapy: WFL for tasks assessed/performed                                 Following commands: Intact       Cueing  General Comments   Cueing Techniques: Verbal cues      Exercises     Shoulder Instructions      Home Living Family/patient expects to be discharged to:: Private residence Living Arrangements: Spouse/significant other;Children Available Help at Discharge: Family;Available 24 hours/day Type of Home: House Home Access: Ramped entrance     Home Layout: One level     Bathroom Shower/Tub: Producer, television/film/video: Standard Bathroom Accessibility: Yes   Home Equipment: Agricultural consultant (2 wheels);Transport chair;Shower seat          Prior Functioning/Environment Prior Level of Function : Independent/Modified Independent             Mobility Comments: independent in home, limited community distances with RW, transport chair for longer distances ADLs Comments: sits to shower, he and his wife take care of their bed bound adult daughter with MS    OT Problem List: Decreased strength;Decreased activity tolerance;Decreased safety awareness   OT Treatment/Interventions: Self-care/ADL training;Therapeutic exercise;Neuromuscular education;Energy conservation;DME and/or AE instruction;Therapeutic activities      OT Goals(Current goals can be found in the care plan section)   Acute Rehab OT Goals OT Goal Formulation: With patient Time For Goal Achievement: 07/15/24 Potential to Achieve Goals: Good   OT Frequency:  Min 2X/week    Co-evaluation              AM-PAC OT 6 Clicks Daily Activity     Outcome Measure Help from another person eating meals?: None Help from another person taking care of personal grooming?: A Little Help from another person toileting, which includes using toliet, bedpan, or urinal?: A Little Help from another person bathing (including washing,  rinsing, drying)?: A Lot Help from another person to put on and taking off regular upper body clothing?: A Little Help from another person to put on and taking off regular lower body clothing?: A Lot 6 Click Score: 17   End of Session Equipment Utilized During Treatment: Rolling walker (2 wheels);Oxygen  Nurse Communication: Mobility status  Activity Tolerance: Patient tolerated treatment well Patient left: in chair;with call bell/phone within reach  OT Visit Diagnosis: Unsteadiness on feet (R26.81);Muscle weakness (generalized) (M62.81);History of falling (Z91.81)                Time: 8983-8960 OT Time Calculation (min): 23 min Charges:  OT General Charges $OT Visit: 1 Visit OT Evaluation $OT Eval Moderate Complexity: 1 Mod OT Treatments $Self Care/Home Management : 8-22 mins  Lamarr Pouch OT/L  Lamarr JONETTA Pouch 07/01/2024, 12:27 PM

## 2024-07-02 ENCOUNTER — Ambulatory Visit: Admitting: Family Medicine

## 2024-07-02 ENCOUNTER — Encounter: Payer: Self-pay | Admitting: Family Medicine

## 2024-07-02 ENCOUNTER — Telehealth: Payer: Self-pay

## 2024-07-02 ENCOUNTER — Encounter: Payer: Self-pay | Admitting: Physician Assistant

## 2024-07-02 VITALS — BP 116/72 | HR 63 | Temp 97.5°F | Ht 63.0 in | Wt 216.5 lb

## 2024-07-02 DIAGNOSIS — N179 Acute kidney failure, unspecified: Secondary | ICD-10-CM

## 2024-07-02 DIAGNOSIS — R1319 Other dysphagia: Secondary | ICD-10-CM | POA: Diagnosis not present

## 2024-07-02 DIAGNOSIS — I5032 Chronic diastolic (congestive) heart failure: Secondary | ICD-10-CM

## 2024-07-02 DIAGNOSIS — B3781 Candidal esophagitis: Secondary | ICD-10-CM | POA: Insufficient documentation

## 2024-07-02 DIAGNOSIS — N189 Chronic kidney disease, unspecified: Secondary | ICD-10-CM

## 2024-07-02 DIAGNOSIS — K219 Gastro-esophageal reflux disease without esophagitis: Secondary | ICD-10-CM

## 2024-07-02 MED ORDER — PANTOPRAZOLE SODIUM 40 MG PO TBEC: 40.0000 mg | DELAYED_RELEASE_TABLET | Freq: Two times a day (BID) | ORAL | 2 refills | Status: AC

## 2024-07-02 NOTE — Assessment & Plan Note (Signed)
 I will request follow-up labs for next week. Has improved with fluids and decreasing his diuretic dose. He will continue to follow with cardiology and nephrology concerning this.

## 2024-07-02 NOTE — Addendum Note (Signed)
 Addended by: [name] HERO on: 07/02/2024 04:39 PM   Modules accepted: Level of Service

## 2024-07-02 NOTE — Transitions of Care (Post Inpatient/ED Visit) (Signed)
 07/02/2024  Name: [name]. MRN: [id number] DOB: [birth year]  Today's TOC FU Call Status: Today's TOC FU Call Status:: Successful TOC FU Call Completed TOC FU Call Complete Date: 07/02/24 Patient's Name and Date of Birth confirmed.  Transition Care Management Follow-up Telephone Call Date of Discharge: 07/01/24 Discharge Facility: [facility]) Type of Discharge: Inpatient Admission Primary Inpatient Discharge Diagnosis:: AKI How have you been since you were released from the hospital?: Better (feels good -just weak) Any questions or concerns?: No  Items Reviewed: Did you receive and understand the discharge instructions provided?: No Medications obtained,verified, and reconciled?: Yes (Medications Reviewed) Any new allergies since your discharge?: No Dietary orders reviewed?: Yes Type of Diet Ordered:: low salt, heart healthy diet Do you have support at home?: Yes People in Home [RPT]: spouse  Medications Reviewed Today: Medications Reviewed Today     Reviewed by Rumalda Alan PENNER, RN (Registered Nurse) on 07/02/24 at 1513  Med List Status: <None>   Medication Order Taking? Sig Documenting Provider Last Dose Status Informant  acetaminophen  (TYLENOL ) 500 MG tablet [id number] Yes Take 500-1,000 mg by mouth every 6 (six) hours as needed for moderate pain (pain score 4-6). [provider]  Active Self, Pharmacy Records  albuterol  (VENTOLIN  HFA) 108 [phone] Base) MCG/ACT inhaler [id number] Yes Inhale 2 puffs into the lungs every 6 (six) hours as needed for wheezing or shortness of breath. [name], Robert S, MD  Active Self, Pharmacy Records  allopurinol  (ZYLOPRIM ) 300 MG tablet [id number] Yes TAKE 1 TABLET BY MOUTH DAILY [name] HERO, MD  Active Self, Pharmacy Records  apixaban  (ELIQUIS ) 2.5 MG TABS tablet [id number] Yes Take 1 tablet (2.5 mg total) by mouth 2 (two) times daily. Elgergawy, Brayton RAMAN, MD  Active   Azelastine  HCl 137 MCG/SPRAY SOLN [id number] Yes Place 2 puffs  into the nose every 12 (twelve) hours as needed (Nasal congestion). Shelah Lamar RAMAN, MD  Active Self, Pharmacy Records  B Complex Vitamins (VITAMIN B COMPLEX  PO) [id number] Yes Take 1 tablet by mouth daily. [provider]  Active Self, Pharmacy Records  calcitRIOL  (ROCALTROL ) 0.25 MCG capsule [id number] Yes TAKE 1 CAPSULE BY MOUTH DAILY [name] HERO, MD  Active Self, Pharmacy Records  carvedilol  (COREG ) 25 MG tablet [id number] Yes TAKE 1 TABLET BY MOUTH TWICE  DAILY WITH MEALS [name] HERO, MD  Active Self, Pharmacy Records  cephALEXin  (KEFLEX ) 500 MG capsule [id number] Yes Take 1 capsule (500 mg total) by mouth 2 (two) times daily for 2 days. Elgergawy, Brayton RAMAN, MD  Active   cetirizine (ZYRTEC) 10 MG tablet [id number] Yes Take 10 mg by mouth as needed for allergies.  [provider]  Active Self, Pharmacy Records  cholecalciferol  (VITAMIN D3) 25 MCG (1000 UNIT) tablet [id number] Yes Take 1,000 Units by mouth daily. [provider]  Active Self, Pharmacy Records  citalopram  (CELEXA ) 10 MG tablet [id number] Yes TAKE 1 TABLET BY MOUTH DAILY [name] HERO, MD  Active Self, Pharmacy Records  colchicine  0.6 MG tablet [id number] Yes Take 1 tablet (0.6 mg total) by mouth every other day for 3 doses. Elgergawy, Brayton RAMAN, MD  Active   diphenhydrAMINE (BENADRYL) 25 MG tablet [id number] Yes Take 25 mg by mouth daily as needed for itching, allergies or sleep. [provider]  Active Self, Pharmacy Records  Dulaglutide  (TRULICITY ) 4.5 MG/0.5ML SOPN [id number] Yes Inject 4.5 mg as directed once a week. Shamleffer, Donell Cardinal, MD  Active Self, Pharmacy Records  Med Note JACKOLYN WADDELL DEL   Wed Jun 24, 2024  1:07 PM) Sunday's  FARXIGA  10 MG TABS tablet [id number] Yes TAKE 1 TABLET BY MOUTH DAILY. Shamleffer, Donell Cardinal, MD  Active Self, Pharmacy Records  ferrous sulfate  324 MG TBEC [id number] Yes Take 324 mg by mouth daily at 2 PM. [provider]  Active  Self, Pharmacy Records  finasteride  (PROSCAR ) 5 MG tablet [id number] Yes TAKE 1 TABLET(5 MG) BY MOUTH DAILY [name] HERO, MD  Active Self, Pharmacy Records  fluconazole  (DIFLUCAN ) 200 MG tablet [id number] Yes Take 1 tablet (200 mg total) by mouth daily. Elgergawy, Brayton RAMAN, MD  Active   fluticasone  (FLONASE ) 50 MCG/ACT nasal spray [id number] Yes Place 2 sprays into both nostrils daily. [name], Robert S, MD  Active Self, Pharmacy Records  fluticasone -salmeterol [facility] INHUB) 250-50 MCG/ACT AEPB [id number] Yes Inhale 1 puff into the lungs in the morning and at bedtime. [provider]  Active Self, Pharmacy Records  guaiFENesin  (MUCINEX ) 600 MG 12 hr tablet [id number] Yes Take 2 tablets (1,200 mg total) by mouth 2 (two) times daily. Cheryle Page, MD  Active Self, Pharmacy Records  hydrocortisone  2.5 % cream [id number] Yes Apply 1 application  topically daily as needed (Irritation). [provider]  Active Self, Pharmacy Records  insulin  aspart (NOVOLOG ) 100 UNIT/ML injection [id number] Yes Inject 7-9 Units into the skin 3 (three) times daily before meals. Kassie Mallick, MD  Active Self, Pharmacy Records  isosorbide  mononitrate (IMDUR ) 60 MG 24 hr tablet [id number] Yes TAKE 1 TABLET BY MOUTH DAILY [name] HERO, MD  Active Self, Pharmacy Records  ketoconazole (NIZORAL) 2 % cream [id number] Yes Apply 1 application topically daily as needed for irritation.  [provider]  Active Self, Pharmacy Records  Multiple Vitamins-Minerals (MULTIVITAMIN PO) [id number] Yes Take 1 tablet by mouth daily. [provider]  Active Self, Pharmacy Records  nystatin cream (MYCOSTATIN) [id number] Yes Apply 1 application  topically 2 (two) times daily as needed for dry skin. [provider]  Active Self, Pharmacy Records  OXYGEN  [id number] Yes Inhale 2 L into the lungs at bedtime. [provider]  Active Self, Pharmacy Records  pantoprazole  (PROTONIX ) 40 MG tablet [id number] Yes  Take 1 tablet (40 mg total) by mouth 2 (two) times daily before a meal. [name] HERO, MD  Active   polyethylene glycol powder (GLYCOLAX/MIRALAX) 17 GM/SCOOP powder [id number] Yes Take 17 g by mouth daily as needed (constipation). [provider]  Active Self, Pharmacy Records  potassium chloride  (KLOR-CON ) 10 MEQ tablet [id number] Yes Take 2 tablets (20 mEq total) by mouth daily. Wonda Sharper, MD  Active Self, Pharmacy Records  Protein [facility] UNFLAVORED PO) [id number] Yes Take 8-16 oz by mouth daily. [provider]  Active Self, Pharmacy Records  rosuvastatin  (CRESTOR ) 20 MG tablet [id number] Yes Take 1 tablet (20 mg total) by mouth daily. Duke, Jon Garre, PA  Active Self, Pharmacy Records  Tiotropium Bromide  Monohydrate (SPIRIVA  RESPIMAT) 2.5 MCG/ACT AERS [id number] Yes Inhale 1 puff into the lungs daily. [provider]  Active Self, Pharmacy Records  torsemide  (DEMADEX ) 20 MG tablet [id number] Yes Take 3 tablets (60 mg total) by mouth daily. Elgergawy, Brayton RAMAN, MD  Active   traZODone  (DESYREL ) 100 MG tablet [id number] Yes TAKE 1 TABLET BY MOUTH AT  BEDTIME [name] HERO, MD  Active Self, Pharmacy Records            Home Care and Equipment/Supplies: Were Home Health Services Ordered?: Yes Name of Home  Health Agency:: [facility] Has Agency set up a time to come to your home?: Yes First Home Health Visit Date: 07/02/24 Any new equipment or medical supplies ordered?: No  Functional Questionnaire: Do you need assistance with bathing/showering or dressing?: Yes (wife) Do you need assistance with meal preparation?: Yes (wife) Do you need assistance with eating?: No Do you have difficulty maintaining continence: No Do you need assistance with getting out of bed/getting out of a chair/moving?: Yes (uses a walker) Do you have difficulty managing or taking your medications?: No  Follow up appointments reviewed: PCP Follow-up appointment confirmed?: Yes Date of PCP  follow-up appointment?: 07/02/24 Follow-up Provider: PCP Specialist Hospital Follow-up appointment confirmed?: No Reason Specialist Follow-Up Not Confirmed: Patient has Specialist Provider Number and will Call for Appointment Do you need transportation to your follow-up appointment?: No Do you understand care options if your condition(s) worsen?: Yes-patient verbalized understanding  SDOH Interventions Today    Flowsheet Row Most Recent Value  SDOH Interventions   Food Insecurity Interventions Intervention Not Indicated  Housing Interventions Intervention Not Indicated  Utilities Interventions Intervention Not Indicated   Reviewed and offered 30 day TOC program and patient consented. Patient had PCP follow up today.  Reports weakness but will start PT next week. Wife is good support system.Patient reports DM under good control.  Denies urinary symptoms at this time.    Goals Addressed             This Visit's Progress    [facility] Transitions of Care (TOC) Care Plan       Problems:  Recent Hospitalization for treatment of CHFand dehydration from diuretics, UTI   Goal:  Over the next 30 days, the patient will not experience hospital readmission  Interventions:   Heart Failure Interventions: Basic overview and discussion of pathophysiology of Heart Failure reviewed Provided education on low sodium diet Reviewed Heart Failure Action Plan in depth verbally, encouraged patient to all MD for changes in weight or swelling Assessed need for readable accurate scales in home Provided education about placing scale on hard, flat surface Advised patient to weigh each morning after emptying bladder Discussed importance of daily weight and advised patient to weigh and record daily Reviewed role of diuretics in prevention of fluid overload and management of heart failure; Discussed the importance of keeping all appointments with provider Screening for signs and symptoms of depression related  to chronic disease state  Assessed social determinant of health barriers  Confirmed home health start of care was today. PT will start next week Confirmed that patient understands his new medications and his discontinued medications.  Patient Self Care Activities:  Attend all scheduled provider appointments Call pharmacy for medication refills 3-7 days in advance of running out of medications Call provider office for new concerns or questions  Notify RN Care Manager of TOC call rescheduling needs Participate in Transition of Care Program/Attend TOC scheduled calls Take medications as prescribed   call office if I gain more than 2 pounds in one day or 5 pounds in one week track weight in diary use salt in moderation watch for swelling in feet, ankles and legs every day weigh myself daily track symptoms and what helps feel better or worse  Plan:  Telephone follow up appointment with care management team member scheduled for:  07/09/2024  at 3pm with Dionne Leath RN [facility] team)        Alan Ee, RN, BSN, CEN Population Health- Transition of Care Team.  [facility] [phone]

## 2024-07-02 NOTE — Assessment & Plan Note (Signed)
 Mr. Julia has need to continue to work with speech therapy. I will place this referral. Some of his issue is likely due to candidal esophagitis. Would anticipate improvement as he finished his fluconazole  course.

## 2024-07-02 NOTE — Assessment & Plan Note (Signed)
 I will send in a revised prescription for his new pantoprazole  dose. He was advised to follow-up with Dr. Federico outpatient, so I will place this referral.

## 2024-07-02 NOTE — Assessment & Plan Note (Signed)
 Finishing fluconazole  today. We did discuss that his use of a steroid inhaler can increase risk for this. I recommend he swish and spit water after each use of his inhaler.

## 2024-07-02 NOTE — Transitions of Care (Post Inpatient/ED Visit) (Signed)
   07/02/2024  Name: [name]. MRN: [id number] DOB: 1941-06-10  Today's TOC FU Call Status: Today's TOC FU Call Status:: Unsuccessful Call (1st Attempt) Unsuccessful Call (1st Attempt) Date: 07/02/24  Attempted to reach the patient regarding the most recent Inpatient/ED visit.  Follow Up Plan: Additional outreach attempts will be made to reach the patient to complete the Transitions of Care (Post Inpatient/ED visit) call.  Alan Ee, RN, BSN, CEN [employer]- Transition of Care Team.  [facility] [phone]
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
## 2024-07-02 NOTE — Assessment & Plan Note (Signed)
 Compensated. Continue carvedilol  25 mg bid, torsemide  20 mg three tablets (60 mg) daily, and dapagliflozin  10 mg daily. I will see him back in 2 weeks to reassess. Continue to follow with cardiology.

## 2024-07-07 ENCOUNTER — Telehealth: Payer: Self-pay

## 2024-07-07 NOTE — Telephone Encounter (Signed)
 Copied from CRM [id number]. Topic: Clinical - Home Health Verbal Orders >> Jul 06, 2024  4:58 PM [place] J wrote: Caller/Agency: Alm / [facility] Callback Number: [phone] Service Requested: Physical Therapy Frequency: 2 times a week fro 3 weeks ; 1 time a week for 4 weeks. Any new concerns about the patient? No *Voicemail box is secured and verbal orders may be left there.*

## 2024-07-07 NOTE — Telephone Encounter (Signed)
 Called and left a verbal okay for PT orders.  Dm/cma

## 2024-07-09 ENCOUNTER — Other Ambulatory Visit: Payer: Self-pay

## 2024-07-09 ENCOUNTER — Other Ambulatory Visit

## 2024-07-09 ENCOUNTER — Ambulatory Visit: Payer: Self-pay | Admitting: Family Medicine

## 2024-07-09 ENCOUNTER — Telehealth: Payer: Self-pay

## 2024-07-09 ENCOUNTER — Telehealth: Payer: Self-pay | Admitting: Cardiovascular Disease

## 2024-07-09 DIAGNOSIS — N179 Acute kidney failure, unspecified: Secondary | ICD-10-CM

## 2024-07-09 DIAGNOSIS — N189 Chronic kidney disease, unspecified: Secondary | ICD-10-CM | POA: Diagnosis not present

## 2024-07-09 LAB — CBC
HCT: 37.7 % — ABNORMAL LOW (ref 39.0–52.0)
Hemoglobin: 12.3 g/dL — ABNORMAL LOW (ref 13.0–17.0)
MCHC: 32.7 g/dL (ref 30.0–36.0)
MCV: 92.5 fl (ref 78.0–100.0)
Platelets: 192 K/uL (ref 150.0–400.0)
RBC: 4.08 Mil/uL — ABNORMAL LOW (ref 4.22–5.81)
RDW: 15.7 % — ABNORMAL HIGH (ref 11.5–15.5)
WBC: 6.6 K/uL (ref 4.0–10.5)

## 2024-07-09 LAB — BASIC METABOLIC PANEL WITH GFR
BUN: 57 mg/dL — ABNORMAL HIGH (ref 6–23)
CO2: 30 meq/L (ref 19–32)
Calcium: 9.1 mg/dL (ref 8.4–10.5)
Chloride: 95 meq/L — ABNORMAL LOW (ref 96–112)
Creatinine, Ser: 1.93 mg/dL — ABNORMAL HIGH (ref 0.40–1.50)
GFR: 31.92 mL/min — ABNORMAL LOW (ref >60.00)
Glucose, Bld: 206 mg/dL — ABNORMAL HIGH (ref 70–99)
Potassium: 4.3 meq/L (ref 3.5–5.1)
Sodium: 136 meq/L (ref 135–145)

## 2024-07-09 NOTE — Telephone Encounter (Signed)
 STAT if HR is under 50 or over 120 (normal HR is 60-100 beats per minute)  What is your heart rate? HR (yesterday) low of 90, high of 124, BP 102/60  Do you have a log of your heart rate readings (document readings)?  No  Do you have any other symptoms?   Cough, chronic fatigue, low appetite, no taste, swelling in legs (+2 edema)  Caller Daisey) stated he say patient after hours yesterday and called to report patient has a cough and irregular heart rate.  Caller stated patient has had these symptoms since he was released from the hospital a week ago.  Caller stated can call patient directly.

## 2024-07-09 NOTE — Patient Instructions (Signed)
 Visit Information  Thank you for taking time to visit with me today. Please don't hesitate to contact me if I can be of assistance to you before our next scheduled telephone appointment.  Our next appointment is by telephone on 07/10/24   Following is a copy of your care plan:   Goals Addressed             This Visit's Progress    VBCI Transitions of Care (TOC) Care Plan       Problems:  Recent Hospitalization for treatment of CHFand dehydration from diuretics, UTI   Goal:  Over the next 30 days, the patient will not experience hospital readmission  Interventions:   Heart Failure Interventions: Basic overview and discussion of pathophysiology of Heart Failure reviewed Provided education on low sodium diet Reviewed Heart Failure Action Plan in depth verbally, encouraged patient to call MD for changes in weight or swelling Assessed need for readable accurate scales in home Provided education about placing scale on hard, flat surface Advised patient to weigh each morning after emptying bladder Discussed importance of daily weight and advised patient to weigh and record daily Reviewed role of diuretics in prevention of fluid overload and management of heart failure; Discussed the importance of keeping all appointments with provider Screening for signs and symptoms of depression related to chronic disease state  Assessed social determinant of health barriers       Patient having some swelling, fatigue and having no appetite.  He states he has not weighed in over a week due to low energy. Patient states he has called the cardiologist and waiting for return call. Advised if symptoms get worse to go to the ED.  He verbalized understanding.    Patient Self Care Activities:  Attend all scheduled provider appointments Call pharmacy for medication refills 3-7 days in advance of running out of medications Call provider office for new concerns or questions  Notify RN Care Manager of TOC  call rescheduling needs Participate in Transition of Care Program/Attend TOC scheduled calls Take medications as prescribed   call office if I gain more than 2 pounds in one day or 5 pounds in one week track weight in diary use salt in moderation watch for swelling in feet, ankles and legs every day weigh myself daily track symptoms and what helps feel better or worse  Plan:  Telephone follow up appointment with care management team member scheduled for:  07/10/2024  for follow up.         Patient verbalizes understanding of instructions and care plan provided today and agrees to view in MyChart. Active MyChart status and patient understanding of how to access instructions and care plan via MyChart confirmed with patient.     The patient has been provided with contact information for the care management team and has been advised to call with any health related questions or concerns.   Please call the care guide team at [phone] if you need to cancel or reschedule your appointment.   Please call the Suicide and Crisis Lifeline: 988 if you are experiencing a Mental Health or Behavioral Health Crisis or need someone to talk to.  Onnie Hatchel J. Kyliah Deanda RN, MSN [facility], [facility] RN Care Manager Direct Dial: [phone]  Fax: [phone] Website: [email or web address]

## 2024-07-09 NOTE — Telephone Encounter (Signed)
 Spoke to patient when he came in for his lab appointment.  The low appetite, fatigue, swelling in legs was going on during his hospital f/u visit with us  on 07/02/24.  The only thing new that Hedda noticed was his irregular HR. Patient states that when he gets anxious that his HR can jump up to 125 and then will go back down.  Patient has a f/u appointment on 07/15/24 with Dr Thedora.  Advised to use deep breathing techniques to calm the anxiety and if it gets worse to please call us  to let us  know.   Patient agreeable to plan. Dm/cma
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
## 2024-07-09 NOTE — Telephone Encounter (Signed)
 Copied from CRM [id number]. Topic: Clinical - Home Health Verbal Orders >> Jul 09, 2024  8:25 AM Burnard DEL wrote: Caller/Agency: [facility] Callback Number: [phone]  Any new concerns about the patient? Yes Physical Therapist seen patient after hours yesterday around 5:15pm. He called to report that Patient had low appetite,no taste,fatigue,swelling up to mid shin,plus 2 edema,heart rate irregular,cough

## 2024-07-09 NOTE — Telephone Encounter (Signed)
 Noted. Dm/cma

## 2024-07-09 NOTE — Telephone Encounter (Signed)
 Patient states since he was hospitalized on July 2nd he has had a cough, chronic fatigue, low appetite, swelling ing legs (2+ edema per [facility] PT). Patient also reports irregular HR 90-124, BP 102/60 per [facility] PT.  Patient states he has COPD, but denies any new or worsening SOB. No missed doses of torsemide  reported.  Patient has appt with PCP on 7/23, no appts with our clinic available before then. Encouraged patient to keep appt with PCP and will forward message to Dr. Verlin to review.

## 2024-07-10 ENCOUNTER — Telehealth: Payer: Self-pay

## 2024-07-10 NOTE — Telephone Encounter (Signed)
 Called and spoke with patient to follow-up on staff message received from Dionne Leath, [place] and Dr. Verlin with recommendation to keep appt with PCP next week and can take an extra 20 mg of torsemide  over the next 4 days (80 mg total x4 days). Patient verbalized understanding and expressed appreciation for follow-up.    See staff message exchange below:  Leath, Dionne, RN  Verlin Lonni BIRCH, MD; Vicci Roxie CROME, RN; P Cv Div [facility] Triage Okay thank you    ----- Message ----- From: Verlin Lonni BIRCH, MD Sent: 07/10/2024   1:30 PM EDT To: Lavaun Seeds, RN; Roxie CROME Vicci, RN; Cv * Subject: RE: Increased swelling and fatigue            Please see phone note from yesterday where we advised him to keep his appt with his primary care doctor next week. We can have him take an extra 20 mg of torsemide  over the next 4 days (total of 80 mg per day). [place] ----- Message ----- From: Leath, Dionne, RN Sent: 07/10/2024   1:08 PM EDT To: Lonni BIRCH Verlin, MD; Roxie CROME Confer* Subject: Increased swelling and fatigue                Hey,  Spoke with [name] today.  He states he is still having swelling to his feet, dry cough and fatigue and no appetite.  He states he has not heard anything from the cardiologist as to next steps.  He did weigh today 214.5 lbs, which he says is 2 lbs more than Wednesday.  He continues a  low salt bland diet.  Please advise.  Dionne J. Leath RN, MSN [facility], [facility] RN Care Manager Direct Dial: [phone]  Fax: [phone] Website: [email or web address]
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
## 2024-07-10 NOTE — Patient Instructions (Signed)
 Visit Information  Thank you for taking time to visit with me today. Please don't hesitate to contact me if I can be of assistance to you before our next scheduled telephone appointment.  Your next appointment is by telephone on 07/16/24 at 200 pm  Following is a copy of your care plan:   Goals Addressed             This Visit's Progress    VBCI Transitions of Care (TOC) Care Plan       Problems:  Recent Hospitalization for treatment of CHFand dehydration from diuretics, UTI   Goal:  Over the next 30 days, the patient will not experience hospital readmission  Interventions:   Heart Failure Interventions: Basic overview and discussion of pathophysiology of Heart Failure reviewed Provided education on low sodium diet Reviewed Heart Failure Action Plan in depth verbally, encouraged patient to call MD for changes in weight or swelling Assessed need for readable accurate scales in home Provided education about placing scale on hard, flat surface Advised patient to weigh each morning after emptying bladder Discussed importance of daily weight and advised patient to weigh and record daily Reviewed role of diuretics in prevention of fluid overload and management of heart failure; Discussed the importance of keeping all appointments with provider Screening for signs and symptoms of depression related to chronic disease state  Assessed social determinant of health barriers       Patient still  having some swelling, fatigue, dry cough and having no appetite.  He states weight today 214.5 lbs today, which he states is 2 lbs more than on Wednesday.  Patient states he is still waiting on cardiologist for any advisement.  RN CM sent a message to Cardiology for any further advisement.  Reviewed heart failure and diet.  He verbalized understanding.  Reiterated if symptoms get worse to go to the ED.  He verbalized understanding.    1:38 pm Dr. Verlin sent a message back advising patient to keep  PCP appointment and that he can take an extra 20 mg of Torsemide  daily for 4 days for a total of 80 mg daily. RN CM spoke with patient,  advised patient to keep PCP appointment on 7-23 and also advised he could take an extra 20 mg of Torsemide  daily for 4 days to total 80 mg per day. Patient verbalized understanding and states he will try that.    Patient Self Care Activities:  Attend all scheduled provider appointments Call pharmacy for medication refills 3-7 days in advance of running out of medications Call provider office for new concerns or questions  Notify RN Care Manager of TOC call rescheduling needs Participate in Transition of Care Program/Attend TOC scheduled calls Take medications as prescribed   call office if I gain more than 2 pounds in one day or 5 pounds in one week track weight in diary use salt in moderation watch for swelling in feet, ankles and legs every day weigh myself daily track symptoms and what helps feel better or worse  Plan:  Telephone follow up appointment with care management team member scheduled for:  07/16/2024  for follow up with Alan Ee, RN.         Patient verbalizes understanding of instructions and care plan provided today and agrees to view in MyChart. Active MyChart status and patient understanding of how to access instructions and care plan via MyChart confirmed with patient.     The patient has been provided with contact information for the  care management team and has been advised to call with any health related questions or concerns.   Please call the care guide team at [phone] if you need to cancel or reschedule your appointment.   Please call the Suicide and Crisis Lifeline: 988 if you are experiencing a Mental Health or Behavioral Health Crisis or need someone to talk to.  Harbor Paster J. Jimi Giza RN, MSN [facility], [facility] Health RN Care Manager Direct Dial: [phone]  Fax:  [phone] Website: [email or web address]

## 2024-07-10 NOTE — Telephone Encounter (Signed)
 Copied from CRM [id number]. Topic: Clinical - Home Health Verbal Orders >> Jul 10, 2024  3:00 PM Laymon HERO wrote: Caller/Agency: [facility]  Callback Number: [phone] Service Requested: Speech Therapy Frequency: 1x week 6 Any new concerns about the patient? No

## 2024-07-10 NOTE — Telephone Encounter (Signed)
 I called and spoke with Shanda with [facility] and notified her that Dr. Thedora ok'd for speech therapy.

## 2024-07-13 ENCOUNTER — Telehealth: Payer: Self-pay | Admitting: Family Medicine

## 2024-07-13 DIAGNOSIS — E1122 Type 2 diabetes mellitus with diabetic chronic kidney disease: Secondary | ICD-10-CM

## 2024-07-13 DIAGNOSIS — E1140 Type 2 diabetes mellitus with diabetic neuropathy, unspecified: Secondary | ICD-10-CM

## 2024-07-13 DIAGNOSIS — K224 Dyskinesia of esophagus: Secondary | ICD-10-CM | POA: Diagnosis not present

## 2024-07-13 DIAGNOSIS — E1151 Type 2 diabetes mellitus with diabetic peripheral angiopathy without gangrene: Secondary | ICD-10-CM

## 2024-07-13 DIAGNOSIS — N1832 Chronic kidney disease, stage 3b: Secondary | ICD-10-CM

## 2024-07-13 DIAGNOSIS — B3781 Candidal esophagitis: Secondary | ICD-10-CM | POA: Diagnosis not present

## 2024-07-13 DIAGNOSIS — I130 Hypertensive heart and chronic kidney disease with heart failure and stage 1 through stage 4 chronic kidney disease, or unspecified chronic kidney disease: Secondary | ICD-10-CM | POA: Diagnosis not present

## 2024-07-13 DIAGNOSIS — I5022 Chronic systolic (congestive) heart failure: Secondary | ICD-10-CM

## 2024-07-13 DIAGNOSIS — N2581 Secondary hyperparathyroidism of renal origin: Secondary | ICD-10-CM

## 2024-07-13 DIAGNOSIS — I2510 Atherosclerotic heart disease of native coronary artery without angina pectoris: Secondary | ICD-10-CM

## 2024-07-13 DIAGNOSIS — N179 Acute kidney failure, unspecified: Secondary | ICD-10-CM

## 2024-07-13 DIAGNOSIS — I5033 Acute on chronic diastolic (congestive) heart failure: Secondary | ICD-10-CM | POA: Diagnosis not present

## 2024-07-13 NOTE — Telephone Encounter (Signed)
 Patient dropped off document Home Health Certificate (Order ID [id number]), to be filled out by provider. Patient requested to send it back via Fax within 7-days. Document is located in providers tray at front office.Please advise at [phone]  [facility] order came through fax. I put in the dr box

## 2024-07-13 NOTE — Telephone Encounter (Signed)
   CLINICAL USE BELOW THIS LINE (use X to signify action taken)  _X__ Form received and placed in providers office for signature. ___ Form completed and faxed to LOA Dept.  ___ Form completed & LVM to notify patient ready for pick up.  ___ Charge sheet and copy of form in front office folder for office supervisor.

## 2024-07-15 ENCOUNTER — Encounter: Payer: Self-pay | Admitting: Family Medicine

## 2024-07-15 ENCOUNTER — Ambulatory Visit: Payer: Self-pay | Admitting: Family Medicine

## 2024-07-15 ENCOUNTER — Ambulatory Visit: Admitting: Family Medicine

## 2024-07-15 VITALS — BP 116/72 | HR 69 | Temp 97.8°F | Ht 63.0 in | Wt 212.0 lb

## 2024-07-15 DIAGNOSIS — H6123 Impacted cerumen, bilateral: Secondary | ICD-10-CM

## 2024-07-15 DIAGNOSIS — E1122 Type 2 diabetes mellitus with diabetic chronic kidney disease: Secondary | ICD-10-CM

## 2024-07-15 DIAGNOSIS — N1832 Chronic kidney disease, stage 3b: Secondary | ICD-10-CM

## 2024-07-15 DIAGNOSIS — E782 Mixed hyperlipidemia: Secondary | ICD-10-CM | POA: Diagnosis not present

## 2024-07-15 DIAGNOSIS — I10 Essential (primary) hypertension: Secondary | ICD-10-CM | POA: Diagnosis not present

## 2024-07-15 DIAGNOSIS — I5032 Chronic diastolic (congestive) heart failure: Secondary | ICD-10-CM | POA: Diagnosis not present

## 2024-07-15 DIAGNOSIS — I2510 Atherosclerotic heart disease of native coronary artery without angina pectoris: Secondary | ICD-10-CM

## 2024-07-15 DIAGNOSIS — Z794 Long term (current) use of insulin: Secondary | ICD-10-CM

## 2024-07-15 LAB — BASIC METABOLIC PANEL WITH GFR
BUN: 57 mg/dL — ABNORMAL HIGH (ref 6–23)
CO2: 31 meq/L (ref 19–32)
Calcium: 9.2 mg/dL (ref 8.4–10.5)
Chloride: 94 meq/L — ABNORMAL LOW (ref 96–112)
Creatinine, Ser: 1.71 mg/dL — ABNORMAL HIGH (ref 0.40–1.50)
GFR: 36.9 mL/min — ABNORMAL LOW (ref >60.00)
Glucose, Bld: 159 mg/dL — ABNORMAL HIGH (ref 70–99)
Potassium: 4.1 meq/L (ref 3.5–5.1)
Sodium: 133 meq/L — ABNORMAL LOW (ref 135–145)

## 2024-07-15 NOTE — Assessment & Plan Note (Signed)
 Stable. Continue focus on blood pressure and glucose control, adequate hydration, and avoidance of nephrotoxic medications. Continue SGLT2i. Continue to work with nephrology. I will recheck renal labs in light of recent increased dose of torsemide .

## 2024-07-15 NOTE — Assessment & Plan Note (Signed)
Stable. No angina. Continue carvedilol 25 mg bid and isosorbide mononitrate 60 mg daily.
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
## 2024-07-15 NOTE — Assessment & Plan Note (Signed)
 LDL is mildly above goal. Continue rosuvastatin  to 20 mg daily.

## 2024-07-15 NOTE — Assessment & Plan Note (Signed)
 Blood pressure is in good control. Unclear why this may have run low this morning. Continue carvedilol  25 mg bid. We will monitor this for now.

## 2024-07-15 NOTE — Assessment & Plan Note (Signed)
 At goal. Continue dapagliflozin  10 mg daily, dulaglutide  (Trulicity ) 4.5 mg weekly, and insulin  aspart sliding scale (7-9 units TID with meals). Continue to follow with Dr. Sam.

## 2024-07-15 NOTE — Assessment & Plan Note (Signed)
 Compensated. Continue carvedilol  25 mg bid, torsemide  20 mg, three tablets (60 mg) daily, and dapagliflozin  10 mg daily. I will see him back in 4 weeks to reassess. Continue to follow with cardiology.

## 2024-07-16 ENCOUNTER — Other Ambulatory Visit: Payer: Self-pay

## 2024-07-16 ENCOUNTER — Telehealth: Payer: Self-pay
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
## 2024-07-16 NOTE — Telephone Encounter (Signed)
 CLINICAL USE BELOW THIS LINE (use X to signify action taken)  ___ Form received and placed in providers office for signature. _X_Form completed and faxed to [facility]  ___ Form completed & LVM to notify patient ready for pick up.  ___ Charge sheet and copy of form in front office folder for office supervisor.   I called [facility] and was placed on hold. No one answered phone only automated.

## 2024-07-16 NOTE — Telephone Encounter (Signed)
 Called Shanda from [facility] health with PCP approval for Respiratory Muscle Training.

## 2024-07-16 NOTE — Patient Instructions (Signed)
 Visit Information  Thank you for taking time to visit with me today. Please don't hesitate to contact me if I can be of assistance to you before our next scheduled telephone appointment.  Following are the goals we discussed today:   Goals Addressed             This Visit's Progress    [facility] Transitions of Care (TOC) Care Plan       Problems:  Recent Hospitalization for treatment of CHFand dehydration from diuretics, UTI.  07/16/2024  Patient reports that he continues to have some lower extremity edema.  Reports weight is down to 212 today.  He took 4 days of extra torsemide .  Patient reports his normal shortness of breath and is on his 2 liters of oxygen .  Reports his wife is taking great care of him.  CBG in normal range for patient per his report. He is self monitoring weight, BP and CBG daily.  Denies any urinary concerns today,  no fever and has completed his antibiotics.    Goal:  Over the next 30 days, the patient will not experience hospital readmission  Interventions:   Heart Failure Interventions:  Provided education on low sodium diet Reviewed Heart Failure Action Plan in depth verbally, encouraged patient to call MD for changes in weight or swelling Assessed need for readable accurate scales in home Provided education about placing scale on hard, flat surface Advised patient to weigh each morning after emptying bladder Discussed importance of daily weight and advised patient to weigh and record daily Reviewed role of diuretics in prevention of fluid overload and management of heart failure; Discussed the importance of keeping all appointments with provider Reviewed with patient when to seek emergency care. Reviewed on call providers available 24/7. Encouraged patient to elevate his legs when possible          Patient Self Care Activities:  Attend all scheduled provider appointments Call pharmacy for medication refills 3-7 days in advance of running out of  medications Call provider office for new concerns or questions  Notify RN Care Manager of TOC call rescheduling needs Participate in Transition of Care Program/Attend TOC scheduled calls Take medications as prescribed   call office if I gain more than 2 pounds in one day or 5 pounds in one week track weight in diary use salt in moderation watch for swelling in feet, ankles and legs every day weigh myself daily track symptoms and what helps feel better or worse Call MD for any changes on condition Change positions slowly  Use walker  Plan:  Telephone follow up appointment with care management team member scheduled for:  07/23/2024  @ 2pm for follow up with Alan Ee, RN.          Our next appointment is by telephone on 07/23/2024  2pm   Please call the care guide team at [phone] if you need to cancel or reschedule your appointment.   If you are experiencing a Mental Health or Behavioral Health Crisis or need someone to talk to, please call the Suicide and Crisis Lifeline: 988 call the [place]  National Suicide Prevention Lifeline: (321) 844-2934 or TTY: 218-059-8605 TTY 517-605-6244) to talk to a trained counselor call 1-800-273-TALK (toll free, 24 hour hotline) call 911   Patient verbalizes understanding of instructions and care plan provided today and agrees to view in MyChart. Active MyChart status and patient understanding of how to access instructions and care plan via MyChart confirmed with patient.     Alan  Rose, RN, BSN, [employer]- Transition of Care Team.  [facility] [phone]

## 2024-07-16 NOTE — Telephone Encounter (Signed)
 Copied from CRM [id number]. Topic: Clinical - Home Health Verbal Orders >> Jul 16, 2024  9:28 AM Chasity T wrote: Caller/Agency: Shanda- [facility] Callback Number: [phone] Service Requested: Speech Therapy Frequency: n/a ( order for respiratory muscle trainer)  Any new concerns about the patient? No

## 2024-07-20 ENCOUNTER — Encounter: Payer: Self-pay | Admitting: Family Medicine

## 2024-07-20 DIAGNOSIS — I5032 Chronic diastolic (congestive) heart failure: Secondary | ICD-10-CM

## 2024-07-20 MED ORDER — POTASSIUM CHLORIDE ER 10 MEQ PO TBCR
20.0000 meq | EXTENDED_RELEASE_TABLET | Freq: Every day | ORAL | 3 refills | Status: DC
Start: 2024-07-20 — End: 2024-08-07

## 2024-07-20 NOTE — Telephone Encounter (Signed)
Please review and advise. Thanks. Dm/cma  

## 2024-07-22 ENCOUNTER — Telehealth: Payer: Self-pay | Admitting: Cardiovascular Disease

## 2024-07-22 NOTE — Telephone Encounter (Signed)
 Pt c/o swelling/edema: STAT if pt has developed SOB within 24 hours  If swelling, where is the swelling located? Chronic LE swelling   How much weight have you gained and in what time span? 3 lbs overnight   Have you gained 2 pounds in a day or 5 pounds in a week? 3lbs   Do you have a log of your daily weights (if so, list)?  213lbs - yesterday  216lbs - today  Are you currently taking a fluid pill? Yes   Are you currently SOB? No   Have you traveled recently in a car or plane for an extended period of time? No

## 2024-07-22 NOTE — Telephone Encounter (Signed)
 Called patient back about message. Patient stated he had a 3 lbs weight gain in a 24 hour period. Patient stated it just happened over night. Patient is currently taking Demadex  60 mg by mouth daily. Encourage patient to keep a low-salt diet and continue with his medications. Will send message to Dr. Verlin and his nurse for advisement.

## 2024-07-23 ENCOUNTER — Other Ambulatory Visit: Payer: Self-pay

## 2024-07-23 NOTE — Telephone Encounter (Signed)
 Spoke with the patient this morning and let him know that Dr. Verlin suggested to take an extra 20 mg of Demadex  today to help with the weight gain. The patient was very receptive, he stated that he will take it today and keep us  updated.

## 2024-07-23 NOTE — Patient Instructions (Signed)
 Visit Information  Thank you for taking time to visit with me today. Please don't hesitate to contact me if I can be of assistance to you before our next scheduled telephone appointment.  Following are the goals we discussed today:   Goals Addressed             This Visit's Progress    [facility] Transitions of Care (TOC) Care Plan       Problems:  Recent Hospitalization for treatment of CHFand dehydration from diuretics, UTI.  07/16/2024  Patient reports that he continues to have some lower extremity edema.  Reports weight is down to 212 today.  He took 4 days of extra torsemide .  Patient reports his normal shortness of breath and is on his 2 liters of oxygen .  Reports his wife is taking great care of him.  CBG in normal range for patient per his report. He is self monitoring weight, BP and CBG daily.  Denies any urinary concerns today,  no fever and has completed his antibiotics. 07/22/2024  Patient reports feeling low energy since yesterday. Weight up 4.5 pounds.  Patient called cardiology and was told to increase Torsemide  this am and patient did this.  Feels short of breath today and is on normal oxygen  at 2 liters.  Per patient swelling noted in the low legs today. Denies any increase in salt.  Did reports no BM yesterday and feels constipated.  BP today initially was 85/50 then came up to 104/58. CBG of 121.   Goal:  Over the next 30 days, the patient will not experience hospital readmission  Interventions:   Heart Failure Interventions: Encouraged patient to continue to follow low salt diet.  Reviewed EMR and notes from MD Reviewed role of diuretics in prevention of fluid overload and management of heart failure; Discussed the importance of keeping all appointments with provider Reviewed with patient when to seek emergency care. Reviewed on call providers available 24/7. Encouraged patient to elevate his legs when possible  Reviewed weight gain  Reviewed decrease energy  level Encouraged patient to take miralax for his constipation.  Reviewed with patient that I will call back tomorrow AM to check on him. This note sent to PCP and cardiology         Patient Self Care Activities:  Attend all scheduled provider appointments Call pharmacy for medication refills 3-7 days in advance of running out of medications Call provider office for new concerns or questions  Notify RN Care Manager of TOC call rescheduling needs Participate in Transition of Care Program/Attend TOC scheduled calls Take medications as prescribed   call office if I gain more than 2 pounds in one day or 5 pounds in one week track weight in diary use salt in moderation watch for swelling in feet, ankles and legs every day weigh myself daily track symptoms and what helps feel better or worse Call MD for any changes on condition Change positions slowly  Use walker Rest today Take miralax today Call 911 for worsening SOB Plan:  Telephone follow up appointment with care management team member scheduled for:  07/24/2024   for follow up with Alan Ee, RN.          Our next appointment is by telephone on 07/24/2024 at in the am  Please call the care guide team at [phone] if you need to cancel or reschedule your appointment.   If you are experiencing a Mental Health or Behavioral Health Crisis or need someone to talk to,  please call the Suicide and Crisis Lifeline: 988 call the [place]  National Suicide Prevention Lifeline: (930)500-4980 or TTY: (754) 335-0444 TTY 249-168-4519) to talk to a trained counselor call 1-800-273-TALK (toll free, 24 hour hotline) call 911   Patient verbalizes understanding of instructions and care plan provided today and agrees to view in MyChart. Active MyChart status and patient understanding of how to access instructions and care plan via MyChart confirmed with patient.     Alan Ee, RN, BSN, CEN [employer]- Transition of Care Team.  [facility] [phone]
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
## 2024-07-24 ENCOUNTER — Other Ambulatory Visit: Payer: Self-pay
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
## 2024-07-28 ENCOUNTER — Ambulatory Visit: Payer: Medicare Other | Admitting: Internal Medicine

## 2024-07-28 ENCOUNTER — Encounter: Payer: Self-pay | Admitting: Internal Medicine

## 2024-07-28 VITALS — BP 120/72 | HR 92 | Ht 63.0 in | Wt 218.0 lb

## 2024-07-28 DIAGNOSIS — E1122 Type 2 diabetes mellitus with diabetic chronic kidney disease: Secondary | ICD-10-CM

## 2024-07-28 DIAGNOSIS — Z794 Long term (current) use of insulin: Secondary | ICD-10-CM

## 2024-07-28 DIAGNOSIS — E1159 Type 2 diabetes mellitus with other circulatory complications: Secondary | ICD-10-CM

## 2024-07-28 DIAGNOSIS — N1832 Chronic kidney disease, stage 3b: Secondary | ICD-10-CM

## 2024-07-28 DIAGNOSIS — E1151 Type 2 diabetes mellitus with diabetic peripheral angiopathy without gangrene: Secondary | ICD-10-CM | POA: Diagnosis not present

## 2024-07-28 NOTE — Patient Instructions (Signed)
Continue Farxiga 10 mg, 1 tablet daily  Continue Trulicity 4.5 mg weekly  Continue Novolog 7-9 units with each meal  Novolog correctional insulin: ADD extra units on insulin to your meal-time Novolog  dose if your blood sugars are higher than 160. Use the scale below to help guide you BEFORE each meal if needed   Blood sugar before meal Number of units to inject  Less than 160 0 unit  161 -  190 1 units  191 -  220 2 units  221 -  250 3 units  251 -  280 4 units  281 -  310 5 units  311 -  370 6 units   HOW TO TREAT LOW BLOOD SUGARS (Blood sugar LESS THAN 70 MG/DL) Please follow the RULE OF 15 for the treatment of hypoglycemia treatment (when your (blood sugars are less than 70 mg/dL)   STEP 1: Take 15 grams of carbohydrates when your blood sugar is low, which includes:  3-4 GLUCOSE TABS  OR 3-4 OZ OF JUICE OR REGULAR SODA OR ONE TUBE OF GLUCOSE GEL    STEP 2: RECHECK blood sugar in 15 MINUTES STEP 3: If your blood sugar is still low at the 15 minute recheck --> then, go back to STEP 1 and treat AGAIN with another 15 grams of carbohydrates.
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
## 2024-07-29 ENCOUNTER — Ambulatory Visit: Payer: Self-pay | Admitting: *Deleted

## 2024-07-29 ENCOUNTER — Telehealth: Payer: Self-pay | Admitting: Cardiovascular Disease

## 2024-07-29 NOTE — Telephone Encounter (Signed)
 Called and spoke to patient,  his BP is 116/62 now and his weight is up 5 lbs to 223 lb.  He was triaged and given an appointment to be seen by Dr Berneta by nurse.  I did advise that he should be go to the ER to  he stated that he will if he gets to feeling worse.   Spoke to Dr Thedora, he advises patient get in touch with Cardiology due to his CHF/kidney issues  he will do that. Dm/cma

## 2024-07-29 NOTE — Telephone Encounter (Signed)
 Spoke to patient he stated since this past Tuesday 8/5 his B/P has been low and pulse fast.Ranging 86/56,93/51,114/58,100/62.Today 86/73.At present 114/62.Pulse [id number].Stated he feels sluggish,no energy.No chest pain.Appointment scheduled with Dr.McAlhany 8/20 at 2:40 pm.Advised to bring a list of pulse,B/P readings and all medications to appointment.Advised if symptoms worsen he will need to go to ED.I will make Dr.McAlhany aware.

## 2024-07-29 NOTE — Telephone Encounter (Signed)
 Copied from CRM #[id number]. Topic: Clinical - Home Health Verbal Orders >> Jul 29, 2024  2:17 PM Burnard DEL wrote: Caller/AgencyBETHA [facility] Callback Number: [phone]  Any new concerns about the patient? Yes B/P is outside of perimeters on the low end.Today reading 86/73 80/52,dizziness,swelling in feet and weight gain

## 2024-07-29 NOTE — Telephone Encounter (Signed)
 FYI Only or Action Required?: FYI only for provider.  Patient was last seen in primary care on 07/15/2024 by [name] HERO, MD.  Called Nurse Triage reporting No chief complaint on file..  Symptoms began several days ago.  Interventions attempted:Rx medications as prescribed, diet, elevation.   Symptoms are: unchanged.  Triage Disposition: See HCP Within 4 Hours (Or PCP Triage)  Patient/caregiver understands and will follow disposition?: Patient unable to come today- needs to arrange transportation- appointment made tomorrow.    Reason for Disposition . [1] Systolic BP 90-110 AND [2] taking blood pressure medications AND [3] feeling weak or lightheaded  Answer Assessment - Initial Assessment Questions 1. BLOOD PRESSURE: What is your blood pressure? Did you take at least two measurements 5 minutes apart?     86/73, 80/52- earlier today- 10:30( patient checked), 117/62 P 134 rapid, second check 118/62  2. ONSET: When did you take your blood pressure?     today 3. HOW: How did you take your blood pressure? (e.g., visiting nurse, automatic home BP monitor)     Automatic- arm 4. HISTORY: Do you have a history of low blood pressure? What is your blood pressure normally?     Normally not low- always normal range 5. MEDICINES: Are you taking any medicines for blood pressure? If Yes, ask: Have they been changed recently?     Diuretics and BP medications- normal dosing- had changes to decrease fluid but now back to normal dosing 6. PULSE RATE: Do you know what your pulse rate is?      Usually runs rapid- 90 7. OTHER SYMPTOMS: Have you been sick recently? Have you had a recent injury?     Weight gain- 5lb- 2 days, dizziness, swelling feet/legs  Protocols used: Blood Pressure - Low-A-AH   Copied from CRM #[id number]. Topic: Clinical - Red Word Triage >> Jul 29, 2024  2:20 PM Burnard DEL wrote: Red Word that prompted transfer to Nurse Triage: Low blood pressure  80/52,dizziness,swelling in feet and legs

## 2024-07-29 NOTE — Telephone Encounter (Signed)
 Pt c/o swelling/edema: STAT if pt has developed SOB within 24 hours  If swelling, where is the swelling located? Both legs and feet  How much weight have you gained and in what time span? 5 pounds  Have you gained 2 pounds in a day or 5 pounds in a week? yes  Do you have a log of your daily weights (if so, list)? 07/27: 212.5, 07/29/24: 223  Are you currently taking a fluid pill? yes  Are you currently SOB? States yes, SOB for last couple of days  Have you traveled recently in a car or plane for an extended period of time?   Pt c/o BP issue: STAT if pt c/o blurred vision, one-sided weakness or slurred speech.  STAT if BP is GREATER than 180/120 TODAY.  STAT if BP is LESS than 90/60 and SYMPTOMATIC TODAY  1. What is your BP concern? Low BP  2. Have you taken any BP medication today?carvedilol , yes  3. What are your last 5 BP readings? Today 86/73 and 80/56 and then about 10 mins later after slapping his thigh and pinching himself his BP went up to 107/67  4. Are you having any other symptoms (ex. Dizziness, headache, blurred vision, passed out)? no

## 2024-07-29 NOTE — Telephone Encounter (Signed)
 Called the number listed for [facility] and left a voice message asking for a call back. Will go and discuss with Dr. Thedora.

## 2024-07-30 ENCOUNTER — Encounter: Admitting: Family Medicine

## 2024-07-30 ENCOUNTER — Inpatient Hospital Stay (HOSPITAL_COMMUNITY)
Admission: EM | Admit: 2024-07-30 | Discharge: 2024-08-07 | DRG: 291 | Disposition: A | Discharge status: Home Health | Source: Ambulatory Visit | Attending: Internal Medicine | Admitting: Internal Medicine

## 2024-07-30 ENCOUNTER — Emergency Department (HOSPITAL_COMMUNITY)

## 2024-07-30 ENCOUNTER — Encounter: Payer: Self-pay | Admitting: Family Medicine

## 2024-07-30 ENCOUNTER — Inpatient Hospital Stay (HOSPITAL_COMMUNITY)

## 2024-07-30 ENCOUNTER — Encounter (HOSPITAL_COMMUNITY): Payer: Self-pay

## 2024-07-30 ENCOUNTER — Other Ambulatory Visit: Payer: Self-pay

## 2024-07-30 DIAGNOSIS — I509 Heart failure, unspecified: Secondary | ICD-10-CM

## 2024-07-30 DIAGNOSIS — Z91013 Allergy to seafood: Secondary | ICD-10-CM

## 2024-07-30 DIAGNOSIS — Z961 Presence of intraocular lens: Secondary | ICD-10-CM | POA: Diagnosis present

## 2024-07-30 DIAGNOSIS — J449 Chronic obstructive pulmonary disease, unspecified: Secondary | ICD-10-CM | POA: Diagnosis present

## 2024-07-30 DIAGNOSIS — I5033 Acute on chronic diastolic (congestive) heart failure: Secondary | ICD-10-CM | POA: Diagnosis not present

## 2024-07-30 DIAGNOSIS — I5031 Acute diastolic (congestive) heart failure: Secondary | ICD-10-CM

## 2024-07-30 DIAGNOSIS — I2510 Atherosclerotic heart disease of native coronary artery without angina pectoris: Secondary | ICD-10-CM | POA: Diagnosis present

## 2024-07-30 DIAGNOSIS — E1165 Type 2 diabetes mellitus with hyperglycemia: Secondary | ICD-10-CM | POA: Diagnosis present

## 2024-07-30 DIAGNOSIS — Z801 Family history of malignant neoplasm of trachea, bronchus and lung: Secondary | ICD-10-CM

## 2024-07-30 DIAGNOSIS — I4891 Unspecified atrial fibrillation: Secondary | ICD-10-CM | POA: Diagnosis not present

## 2024-07-30 DIAGNOSIS — Z7901 Long term (current) use of anticoagulants: Secondary | ICD-10-CM

## 2024-07-30 DIAGNOSIS — Z7984 Long term (current) use of oral hypoglycemic drugs: Secondary | ICD-10-CM

## 2024-07-30 DIAGNOSIS — Z9841 Cataract extraction status, right eye: Secondary | ICD-10-CM

## 2024-07-30 DIAGNOSIS — J9621 Acute and chronic respiratory failure with hypoxia: Secondary | ICD-10-CM | POA: Diagnosis not present

## 2024-07-30 DIAGNOSIS — I4892 Unspecified atrial flutter: Secondary | ICD-10-CM | POA: Diagnosis present

## 2024-07-30 DIAGNOSIS — M810 Age-related osteoporosis without current pathological fracture: Secondary | ICD-10-CM | POA: Diagnosis present

## 2024-07-30 DIAGNOSIS — Z8249 Family history of ischemic heart disease and other diseases of the circulatory system: Secondary | ICD-10-CM | POA: Diagnosis not present

## 2024-07-30 DIAGNOSIS — Z6839 Body mass index (BMI) 39.0-39.9, adult: Secondary | ICD-10-CM | POA: Diagnosis not present

## 2024-07-30 DIAGNOSIS — Z794 Long term (current) use of insulin: Secondary | ICD-10-CM

## 2024-07-30 DIAGNOSIS — F419 Anxiety disorder, unspecified: Secondary | ICD-10-CM | POA: Diagnosis not present

## 2024-07-30 DIAGNOSIS — Z79899 Other long term (current) drug therapy: Secondary | ICD-10-CM | POA: Diagnosis not present

## 2024-07-30 DIAGNOSIS — I5023 Acute on chronic systolic (congestive) heart failure: Secondary | ICD-10-CM | POA: Diagnosis not present

## 2024-07-30 DIAGNOSIS — Z7985 Long-term (current) use of injectable non-insulin antidiabetic drugs: Secondary | ICD-10-CM

## 2024-07-30 DIAGNOSIS — I252 Old myocardial infarction: Secondary | ICD-10-CM

## 2024-07-30 DIAGNOSIS — R5381 Other malaise: Secondary | ICD-10-CM | POA: Diagnosis present

## 2024-07-30 DIAGNOSIS — N2581 Secondary hyperparathyroidism of renal origin: Secondary | ICD-10-CM | POA: Diagnosis present

## 2024-07-30 DIAGNOSIS — Z9884 Bariatric surgery status: Secondary | ICD-10-CM

## 2024-07-30 DIAGNOSIS — I5043 Acute on chronic combined systolic (congestive) and diastolic (congestive) heart failure: Secondary | ICD-10-CM | POA: Diagnosis present

## 2024-07-30 DIAGNOSIS — N1832 Chronic kidney disease, stage 3b: Secondary | ICD-10-CM | POA: Diagnosis present

## 2024-07-30 DIAGNOSIS — Z888 Allergy status to other drugs, medicaments and biological substances status: Secondary | ICD-10-CM

## 2024-07-30 DIAGNOSIS — N400 Enlarged prostate without lower urinary tract symptoms: Secondary | ICD-10-CM | POA: Diagnosis present

## 2024-07-30 DIAGNOSIS — Z9842 Cataract extraction status, left eye: Secondary | ICD-10-CM

## 2024-07-30 DIAGNOSIS — Z85828 Personal history of other malignant neoplasm of skin: Secondary | ICD-10-CM

## 2024-07-30 DIAGNOSIS — Z9981 Dependence on supplemental oxygen: Secondary | ICD-10-CM | POA: Diagnosis not present

## 2024-07-30 DIAGNOSIS — I482 Chronic atrial fibrillation, unspecified: Secondary | ICD-10-CM | POA: Diagnosis not present

## 2024-07-30 DIAGNOSIS — I5032 Chronic diastolic (congestive) heart failure: Secondary | ICD-10-CM

## 2024-07-30 DIAGNOSIS — I110 Hypertensive heart disease with heart failure: Secondary | ICD-10-CM | POA: Diagnosis not present

## 2024-07-30 DIAGNOSIS — I69954 Hemiplegia and hemiparesis following unspecified cerebrovascular disease affecting left non-dominant side: Secondary | ICD-10-CM | POA: Diagnosis not present

## 2024-07-30 DIAGNOSIS — E8809 Other disorders of plasma-protein metabolism, not elsewhere classified: Secondary | ICD-10-CM | POA: Diagnosis present

## 2024-07-30 DIAGNOSIS — K219 Gastro-esophageal reflux disease without esophagitis: Secondary | ICD-10-CM | POA: Diagnosis present

## 2024-07-30 DIAGNOSIS — I4949 Other premature depolarization: Secondary | ICD-10-CM | POA: Diagnosis not present

## 2024-07-30 DIAGNOSIS — Z800 Family history of malignant neoplasm of digestive organs: Secondary | ICD-10-CM

## 2024-07-30 DIAGNOSIS — Z87891 Personal history of nicotine dependence: Secondary | ICD-10-CM

## 2024-07-30 DIAGNOSIS — E785 Hyperlipidemia, unspecified: Secondary | ICD-10-CM | POA: Diagnosis present

## 2024-07-30 DIAGNOSIS — I2582 Chronic total occlusion of coronary artery: Secondary | ICD-10-CM | POA: Diagnosis present

## 2024-07-30 DIAGNOSIS — I10 Essential (primary) hypertension: Secondary | ICD-10-CM | POA: Diagnosis not present

## 2024-07-30 DIAGNOSIS — I4819 Other persistent atrial fibrillation: Secondary | ICD-10-CM | POA: Diagnosis present

## 2024-07-30 DIAGNOSIS — E1122 Type 2 diabetes mellitus with diabetic chronic kidney disease: Secondary | ICD-10-CM | POA: Diagnosis present

## 2024-07-30 DIAGNOSIS — I5021 Acute systolic (congestive) heart failure: Secondary | ICD-10-CM | POA: Diagnosis not present

## 2024-07-30 DIAGNOSIS — Z91041 Radiographic dye allergy status: Secondary | ICD-10-CM

## 2024-07-30 DIAGNOSIS — J9611 Chronic respiratory failure with hypoxia: Secondary | ICD-10-CM | POA: Diagnosis present

## 2024-07-30 DIAGNOSIS — Z833 Family history of diabetes mellitus: Secondary | ICD-10-CM

## 2024-07-30 DIAGNOSIS — E1151 Type 2 diabetes mellitus with diabetic peripheral angiopathy without gangrene: Secondary | ICD-10-CM | POA: Diagnosis present

## 2024-07-30 DIAGNOSIS — R0602 Shortness of breath: Secondary | ICD-10-CM | POA: Diagnosis not present

## 2024-07-30 DIAGNOSIS — Z820 Family history of epilepsy and other diseases of the nervous system: Secondary | ICD-10-CM

## 2024-07-30 DIAGNOSIS — Z7951 Long term (current) use of inhaled steroids: Secondary | ICD-10-CM

## 2024-07-30 DIAGNOSIS — I5041 Acute combined systolic (congestive) and diastolic (congestive) heart failure: Secondary | ICD-10-CM | POA: Diagnosis not present

## 2024-07-30 DIAGNOSIS — E66812 Obesity, class 2: Secondary | ICD-10-CM | POA: Diagnosis present

## 2024-07-30 DIAGNOSIS — D509 Iron deficiency anemia, unspecified: Secondary | ICD-10-CM | POA: Diagnosis present

## 2024-07-30 DIAGNOSIS — I130 Hypertensive heart and chronic kidney disease with heart failure and stage 1 through stage 4 chronic kidney disease, or unspecified chronic kidney disease: Principal | ICD-10-CM | POA: Diagnosis present

## 2024-07-30 DIAGNOSIS — J432 Centrilobular emphysema: Secondary | ICD-10-CM | POA: Diagnosis not present

## 2024-07-30 DIAGNOSIS — K5900 Constipation, unspecified: Secondary | ICD-10-CM | POA: Diagnosis present

## 2024-07-30 DIAGNOSIS — Z955 Presence of coronary angioplasty implant and graft: Secondary | ICD-10-CM

## 2024-07-30 DIAGNOSIS — Z86711 Personal history of pulmonary embolism: Secondary | ICD-10-CM

## 2024-07-30 LAB — HEPATIC FUNCTION PANEL
ALT: 17 U/L (ref 0–44)
AST: 23 U/L (ref 15–41)
Albumin: 3 g/dL — ABNORMAL LOW (ref 3.5–5.0)
Alkaline Phosphatase: 55 U/L (ref 38–126)
Bilirubin, Direct: 0.2 mg/dL (ref 0.0–0.2)
Indirect Bilirubin: 0.5 mg/dL (ref 0.3–0.9)
Total Bilirubin: 0.7 mg/dL (ref 0.0–1.2)
Total Protein: 6 g/dL — ABNORMAL LOW (ref 6.5–8.1)

## 2024-07-30 LAB — GLUCOSE, CAPILLARY
Glucose-Capillary: 148 mg/dL — ABNORMAL HIGH (ref 70–99)
Glucose-Capillary: 153 mg/dL — ABNORMAL HIGH (ref 70–99)
Glucose-Capillary: 196 mg/dL — ABNORMAL HIGH (ref 70–99)

## 2024-07-30 LAB — CBC
HCT: 39.3 % (ref 39.0–52.0)
Hemoglobin: 12.3 g/dL — ABNORMAL LOW (ref 13.0–17.0)
MCH: 30.4 pg (ref 26.0–34.0)
MCHC: 31.3 g/dL (ref 30.0–36.0)
MCV: 97 fL (ref 80.0–100.0)
Platelets: 183 K/uL (ref 150–400)
RBC: 4.05 MIL/uL — ABNORMAL LOW (ref 4.22–5.81)
RDW: 14.7 % (ref 11.5–15.5)
WBC: 7 K/uL (ref 4.0–10.5)
nRBC: 0 % (ref 0.0–0.2)

## 2024-07-30 LAB — BASIC METABOLIC PANEL WITH GFR
Anion gap: 12 (ref 5–15)
BUN: 47 mg/dL — ABNORMAL HIGH (ref 8–23)
CO2: 27 mmol/L (ref 22–32)
Calcium: 9.3 mg/dL (ref 8.9–10.3)
Chloride: 98 mmol/L (ref 98–111)
Creatinine, Ser: 1.75 mg/dL — ABNORMAL HIGH (ref 0.61–1.24)
GFR, Estimated: 38 mL/min — ABNORMAL LOW (ref >60)
Glucose, Bld: 107 mg/dL — ABNORMAL HIGH (ref 70–99)
Potassium: 4.4 mmol/L (ref 3.5–5.1)
Sodium: 137 mmol/L (ref 135–145)

## 2024-07-30 LAB — BRAIN NATRIURETIC PEPTIDE: B Natriuretic Peptide: 548 pg/mL — ABNORMAL HIGH (ref 0.0–100.0)

## 2024-07-30 LAB — MAGNESIUM: Magnesium: 2.3 mg/dL (ref 1.7–2.4)

## 2024-07-30 MED ORDER — ONDANSETRON HCL 4 MG PO TABS: 4.0000 mg | ORAL_TABLET | Freq: Four times a day (QID) | ORAL | Status: DC | PRN

## 2024-07-30 MED ORDER — SODIUM CHLORIDE 0.9 % IV SOLN: 250.0000 mL | INTRAVENOUS | Status: AC | PRN

## 2024-07-30 MED ORDER — ROSUVASTATIN CALCIUM 20 MG PO TABS
20.0000 mg | ORAL_TABLET | Freq: Every day | ORAL | Status: DC
  Administered 2024-07-31 – 2024-08-07 (×11): 20 mg via ORAL
  Filled 2024-07-30 (×8): qty 1

## 2024-07-30 MED ORDER — INSULIN ASPART 100 UNIT/ML IJ SOLN
0.0000 [IU] | Freq: Three times a day (TID) | INTRAMUSCULAR | Status: DC
  Administered 2024-07-30: 1 [IU] via SUBCUTANEOUS
  Administered 2024-07-31: 2 [IU] via SUBCUTANEOUS
  Administered 2024-07-31: 5 [IU] via SUBCUTANEOUS
  Administered 2024-07-31: 1 [IU] via SUBCUTANEOUS
  Administered 2024-08-01: 9 [IU] via SUBCUTANEOUS
  Administered 2024-08-01 – 2024-08-02 (×3): 2 [IU] via SUBCUTANEOUS
  Administered 2024-08-02: 9 [IU] via SUBCUTANEOUS
  Administered 2024-08-02 – 2024-08-03 (×2): 2 [IU] via SUBCUTANEOUS
  Administered 2024-08-03: 3 [IU] via SUBCUTANEOUS
  Administered 2024-08-03: 5 [IU] via SUBCUTANEOUS
  Administered 2024-08-03: 3 [IU] via SUBCUTANEOUS
  Administered 2024-08-03: 2 [IU] via SUBCUTANEOUS
  Administered 2024-08-03: 5 [IU] via SUBCUTANEOUS
  Administered 2024-08-04: 2 [IU] via SUBCUTANEOUS
  Administered 2024-08-04: 7 [IU] via SUBCUTANEOUS
  Administered 2024-08-04 (×2): 5 [IU] via SUBCUTANEOUS
  Administered 2024-08-04: 2 [IU] via SUBCUTANEOUS
  Administered 2024-08-04: 7 [IU] via SUBCUTANEOUS
  Administered 2024-08-05: 1 [IU] via SUBCUTANEOUS
  Administered 2024-08-05: 9 [IU] via SUBCUTANEOUS
  Administered 2024-08-05: 8 [IU] via SUBCUTANEOUS
  Administered 2024-08-05: 9 [IU] via SUBCUTANEOUS
  Administered 2024-08-05: 1 [IU] via SUBCUTANEOUS
  Administered 2024-08-05: 8 [IU] via SUBCUTANEOUS
  Administered 2024-08-06: 1 [IU] via SUBCUTANEOUS
  Administered 2024-08-06: 3 [IU] via SUBCUTANEOUS
  Administered 2024-08-06 – 2024-08-07 (×2): 1 [IU] via SUBCUTANEOUS
  Administered 2024-08-07: 3 [IU] via SUBCUTANEOUS

## 2024-07-30 MED ORDER — FERROUS SULFATE 325 (65 FE) MG PO TABS
324.0000 mg | ORAL_TABLET | Freq: Every day | ORAL | Status: DC
  Administered 2024-07-31 – 2024-08-03 (×5): 324 mg via ORAL
  Administered 2024-08-04 (×2): 325 mg via ORAL
  Administered 2024-08-05 – 2024-08-07 (×4): 324 mg via ORAL
  Filled 2024-07-30 (×8): qty 1

## 2024-07-30 MED ORDER — LORATADINE 10 MG PO TABS
10.0000 mg | ORAL_TABLET | Freq: Every day | ORAL | Status: DC | PRN
  Administered 2024-08-01: 10 mg via ORAL
  Filled 2024-07-30: qty 1

## 2024-07-30 MED ORDER — POTASSIUM CHLORIDE CRYS ER 10 MEQ PO TBCR
20.0000 meq | EXTENDED_RELEASE_TABLET | Freq: Every day | ORAL | Status: DC
  Administered 2024-07-31 – 2024-08-07 (×11): 20 meq via ORAL
  Filled 2024-07-30 (×8): qty 2

## 2024-07-30 MED ORDER — FUROSEMIDE 10 MG/ML IJ SOLN: 60.0000 mg | Freq: Once | INTRAMUSCULAR | Status: DC

## 2024-07-30 MED ORDER — SODIUM CHLORIDE 0.9% FLUSH: 3.0000 mL | INTRAVENOUS | Status: DC | PRN

## 2024-07-30 MED ORDER — TRAZODONE HCL 100 MG PO TABS
100.0000 mg | ORAL_TABLET | Freq: Every day | ORAL | Status: DC
  Administered 2024-07-30 – 2024-08-06 (×11): 100 mg via ORAL
  Filled 2024-07-30 (×8): qty 1

## 2024-07-30 MED ORDER — ALBUTEROL SULFATE (2.5 MG/3ML) 0.083% IN NEBU: 2.5000 mg | INHALATION_SOLUTION | RESPIRATORY_TRACT | Status: DC | PRN

## 2024-07-30 MED ORDER — SODIUM CHLORIDE 0.9% FLUSH
3.0000 mL | Freq: Two times a day (BID) | INTRAVENOUS | Status: DC
  Administered 2024-07-30 – 2024-08-07 (×21): 3 mL via INTRAVENOUS

## 2024-07-30 MED ORDER — GUAIFENESIN ER 600 MG PO TB12
1200.0000 mg | ORAL_TABLET | Freq: Two times a day (BID) | ORAL | Status: DC
  Administered 2024-07-30 – 2024-08-07 (×22): 1200 mg via ORAL
  Filled 2024-07-30 (×17): qty 2

## 2024-07-30 MED ORDER — APIXABAN 2.5 MG PO TABS
2.5000 mg | ORAL_TABLET | Freq: Two times a day (BID) | ORAL | Status: DC
  Administered 2024-07-30 – 2024-08-07 (×22): 2.5 mg via ORAL
  Filled 2024-07-30 (×16): qty 1

## 2024-07-30 MED ORDER — PERFLUTREN LIPID MICROSPHERE
1.0000 mL | INTRAVENOUS | Status: AC | PRN
  Administered 2024-07-30: 2 mL via INTRAVENOUS

## 2024-07-30 MED ORDER — CARVEDILOL 12.5 MG PO TABS
12.5000 mg | ORAL_TABLET | Freq: Two times a day (BID) | ORAL | Status: DC
  Administered 2024-07-30 – 2024-08-07 (×22): 12.5 mg via ORAL
  Filled 2024-07-30 (×16): qty 1

## 2024-07-30 MED ORDER — FINASTERIDE 5 MG PO TABS
5.0000 mg | ORAL_TABLET | Freq: Every day | ORAL | Status: DC
  Administered 2024-07-31 – 2024-08-07 (×11): 5 mg via ORAL
  Filled 2024-07-30 (×8): qty 1

## 2024-07-30 MED ORDER — BISACODYL 5 MG PO TBEC: 5.0000 mg | DELAYED_RELEASE_TABLET | Freq: Every day | ORAL | Status: DC | PRN

## 2024-07-30 MED ORDER — FUROSEMIDE 10 MG/ML IJ SOLN
80.0000 mg | Freq: Two times a day (BID) | INTRAMUSCULAR | Status: DC
  Administered 2024-07-30 – 2024-07-31 (×2): 80 mg via INTRAVENOUS
  Filled 2024-07-30 (×2): qty 8

## 2024-07-30 MED ORDER — ACETAMINOPHEN 325 MG PO TABS
650.0000 mg | ORAL_TABLET | Freq: Four times a day (QID) | ORAL | Status: DC | PRN
  Administered 2024-08-02: 650 mg via ORAL
  Filled 2024-07-30 (×2): qty 2

## 2024-07-30 MED ORDER — ONDANSETRON HCL 4 MG/2ML IJ SOLN: 4.0000 mg | Freq: Four times a day (QID) | INTRAMUSCULAR | Status: DC | PRN

## 2024-07-30 MED ORDER — UMECLIDINIUM BROMIDE 62.5 MCG/ACT IN AEPB
1.0000 | INHALATION_SPRAY | Freq: Every day | RESPIRATORY_TRACT | Status: DC
  Administered 2024-07-31 – 2024-08-07 (×11): 1 via RESPIRATORY_TRACT
  Filled 2024-07-30 (×2): qty 7

## 2024-07-30 MED ORDER — POLYETHYLENE GLYCOL 3350 17 G PO PACK
17.0000 g | PACK | Freq: Every day | ORAL | Status: DC | PRN
  Administered 2024-08-01 – 2024-08-02 (×2): 17 g via ORAL
  Filled 2024-07-30 (×2): qty 1

## 2024-07-30 MED ORDER — FLUTICASONE PROPIONATE 50 MCG/ACT NA SUSP
2.0000 | Freq: Every day | NASAL | Status: DC | PRN
  Administered 2024-08-03 – 2024-08-04 (×4): 2 via NASAL
  Filled 2024-07-30: qty 16

## 2024-07-30 MED ORDER — FLUTICASONE FUROATE-VILANTEROL 200-25 MCG/ACT IN AEPB
1.0000 | INHALATION_SPRAY | Freq: Every day | RESPIRATORY_TRACT | Status: DC
  Administered 2024-07-31 – 2024-08-07 (×11): 1 via RESPIRATORY_TRACT
  Filled 2024-07-30: qty 28

## 2024-07-30 MED ORDER — PANTOPRAZOLE SODIUM 40 MG PO TBEC
40.0000 mg | DELAYED_RELEASE_TABLET | Freq: Two times a day (BID) | ORAL | Status: DC
  Administered 2024-07-30 – 2024-08-07 (×22): 40 mg via ORAL
  Filled 2024-07-30 (×16): qty 1

## 2024-07-30 MED ORDER — ALLOPURINOL 300 MG PO TABS
300.0000 mg | ORAL_TABLET | Freq: Every day | ORAL | Status: DC
  Administered 2024-07-31 – 2024-08-07 (×11): 300 mg via ORAL
  Filled 2024-07-30 (×8): qty 1

## 2024-07-30 MED ORDER — CITALOPRAM HYDROBROMIDE 10 MG PO TABS
10.0000 mg | ORAL_TABLET | Freq: Every morning | ORAL | Status: DC
Start: 2024-07-31 — End: 2024-08-07
  Administered 2024-07-31 – 2024-08-07 (×11): 10 mg via ORAL
  Filled 2024-07-30 (×8): qty 1

## 2024-07-30 MED ORDER — FUROSEMIDE 10 MG/ML IJ SOLN: 80.0000 mg | Freq: Two times a day (BID) | INTRAMUSCULAR | Status: DC

## 2024-07-30 MED ORDER — METOPROLOL TARTRATE 5 MG/5ML IV SOLN
2.5000 mg | INTRAVENOUS | Status: AC
  Administered 2024-07-30: 2.5 mg via INTRAVENOUS
  Filled 2024-07-30: qty 5

## 2024-07-30 MED ORDER — ACETAMINOPHEN 650 MG RE SUPP
650.0000 mg | Freq: Four times a day (QID) | RECTAL | Status: DC | PRN
Start: 2024-07-30 — End: 2024-08-07

## 2024-07-30 NOTE — ED Triage Notes (Signed)
 Pt states he thinks he is in afib again, 10lb weight gain in 3 days/ SHOB. Denies CP. Pt is on 2L of O2 at baseline. Axox4. C/O heart palpations.

## 2024-07-30 NOTE — Plan of Care (Signed)

## 2024-07-30 NOTE — Progress Notes (Signed)
 Echocardiogram 2D Echocardiogram has been performed.  [name] 07/30/2024, 6:05 PM
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
## 2024-07-30 NOTE — Plan of Care (Signed)
   Problem: Health Behavior/Discharge Planning: Goal: Ability to manage health-related needs will improve Outcome: Progressing

## 2024-07-30 NOTE — Patient Instructions (Signed)
 Visit Information  Thank you for taking time to visit with me today.   Following is a copy of your care plan:   Goals Addressed             This Visit's Progress    [facility] Transitions of Care (TOC) Care Plan       Problems:  Recent Hospitalization for treatment of CHFand dehydration from diuretics, UTI.  07/16/2024  Patient reports that he continues to have some lower extremity edema.  Reports weight is down to 212 today.  He took 4 days of extra torsemide .  Patient reports his normal shortness of breath and is on his 2 liters of oxygen .  Reports his wife is taking great care of him.  CBG in normal range for patient per his report. He is self monitoring weight, BP and CBG daily.  Denies any urinary concerns today,  no fever and has completed his antibiotics. 07/22/2024  Patient reports feeling low energy since yesterday. Weight up 4.5 pounds.  Patient called cardiology and was told to increase Torsemide  this am and patient did this.  Feels short of breath today and is on normal oxygen  at 2 liters.  Per patient swelling noted in the low legs today. Denies any increase in salt.  Did reports no BM yesterday and feels constipated.  BP today initially was 85/50 then came up to 104/58. CBG of 121.07/24/2024  no change in weight today, BP low,energy low.  Reports decrease in swelling  ( still in the bed) and decrease in shortness of breath. No BM yet. 07/30/2024  Worsening shortness of breath.  BP remains low, HR up to 126 today- no chest pain. Swelling noted in abdomen per wife.  Continues to use her oxygen . Reports feels terrible. Has a PCP office visit at 11 am. Grandson to drive.    Goal:  Over the next 30 days, the patient will not experience hospital readmission  Interventions:   Heart Failure Interventions: Encouraged patient to continue to follow low salt diet.  Reviewed EMR and notes from endocrinology Discussed the importance of keeping all appointments with provider Reviewed with patient  when to seek emergency care. Reviewed on call providers available 24/7. Encouraged patient to elevate his legs when possible  Reviewed weight gain  Reviewed decrease energy level Urgent in basket to assigned cardiology.  Multiple calls to PCP office. See contact list of details.  ---- Message -----  From: Rumalda Alan PENNER, RN  Sent: 07/30/2024  10:43 AM EDT  To: Lonni JONETTA Cash, MD  Subject: FW: urgent                                     He is on his way to PCP office. Cloretta Side. I am calling that office right now and I am trying to speak with provider who is seeing patient to advise of your recommendations.   Thanks  ----- Message -----  From: Cash Lonni JONETTA, MD  Sent: 07/30/2024  10:13 AM EDT  To: Alan PENNER Rumalda, RN; Addie Blush, RN  Subject: RE: urgent                                     He needs to be admitted. He should be advised to go into the ED for admission the cardiology service. He can go to  the [facility] ED and can be transferred to [facility] for admission. [place]  ----- Message -----  From: Rumalda Alan PENNER, RN  Sent: 07/30/2024   9:38 AM EDT  To: Lonni JONETTA Cash, MD  Subject: urgent                                         TOC call to patient today. Not doing well.  Weight gain of 10 pounds in 10 days. Swelling in legs and abdomen.  BP low  HR today of 126.  Bouncing all over the place per patient.  Increased shortness of breath- on 2 liters [place]  Decreased energy and feels bad.  Denies missing any medications.   Has been trying to get some help and having a hard time. Home health nurse has called and patient has also called. PCP office agreed to see him at 11 am today.  But I think he needs cardiology to see him today.  Is that possible?  He was discharged about 28 days ago and we are trying to avoid a readmission.    He sounds like he is struggling today by phone. Worried about him.    Please advise.  I told him I was sending you a message.   Thanks for any help you can provide this sweet man.   Alan Rumalda, RN, BSN, CEN  Population Health- Transition of Care Team.  [facility]  [phone]         Patient Self Care Activities:  Take your meds. Go to the PCP office as planned at 11 am Call 911 for worsening SOB or chest pain Plan:  Will see if readmitted after MD visit. Placed call to [facility] office and spoke with CMA to provide message to Dr. Berneta from cardiology about patient needing readmission.         Patient verbalizes understanding of instructions and care plan provided today and agrees to view in MyChart. Active MyChart status and patient understanding of how to access instructions and care plan via MyChart confirmed with patient.     Plan: Pending OV and evaluation for readmission as suggested by cardiology  Please call the care guide team at [phone] if you need to cancel or reschedule your appointment.   Please call the Suicide and Crisis Lifeline: 988 call the [place]  National Suicide Prevention Lifeline: 352 499 6950 or TTY: 647-794-7704 TTY 6032586989) to talk to a trained counselor call 1-800-273-TALK (toll free, 24 hour hotline) call 911 if you are experiencing a Mental Health or Behavioral Health Crisis or need someone to talk to.  Alan Rumalda, RN, BSN, CEN [employer]- Transition of Care Team.  [facility] [phone]

## 2024-07-30 NOTE — ED Provider Notes (Signed)
 [place] EMERGENCY DEPARTMENT AT [facility] Provider Note   CSN: [id number] Arrival date & time: 07/30/24  1157     Patient presents with: Atrial Fibrillation   [name]. is a 83 y.o. male.  {Add pertinent medical, surgical, social history, OB history to HPI:3572} 82 year old male history of COPD on 2 L home oxygen , CHF, atrial fibrillation on Eliquis , PE, diabetes, and hypertension who presents emergency department with shortness of breath.  The patient reports that since the end of July has gained approximately 10 pounds.  Is been compliant with his torsemide  as well as fluid and sodium restriction.  He started getting short of breath.  Has had PND.  Noticed that his heart rate was high and his blood pressure was low and called his outpatient doctor told him to come into the emergency department.  Denies any chest pain.  Says that his legs and belly are particularly swollen.       Prior to Admission medications   Medication Sig Start Date End Date Taking? Authorizing Provider  acetaminophen  (TYLENOL ) 500 MG tablet Take 500-1,000 mg by mouth every 6 (six) hours as needed for moderate pain (pain score 4-6).    [provider]  albuterol  (VENTOLIN  HFA) 108 (90 Base) MCG/ACT inhaler Inhale 2 puffs into the lungs every 6 (six) hours as needed for wheezing or shortness of breath. 10/16/23   Shelah Lamar RAMAN, MD  allopurinol  (ZYLOPRIM ) 300 MG tablet TAKE 1 TABLET BY MOUTH DAILY 11/04/23   [name] HERO, MD  apixaban  (ELIQUIS ) 2.5 MG TABS tablet Take 1 tablet (2.5 mg total) by mouth 2 (two) times daily. 07/01/24 09/29/24  Elgergawy, Brayton RAMAN, MD  Azelastine  HCl 137 MCG/SPRAY SOLN Place 2 puffs into the nose every 12 (twelve) hours as needed (Nasal congestion). 10/16/23   Shelah Lamar RAMAN, MD  B Complex Vitamins (VITAMIN B COMPLEX  PO) Take 1 tablet by mouth daily.    [provider]  calcitRIOL  (ROCALTROL ) 0.25 MCG capsule TAKE 1 CAPSULE BY MOUTH DAILY 02/07/24    [name] HERO, MD  carvedilol  (COREG ) 25 MG tablet TAKE 1 TABLET BY MOUTH TWICE  DAILY WITH MEALS 02/07/24   [name] HERO, MD  cetirizine (ZYRTEC) 10 MG tablet Take 10 mg by mouth as needed for allergies.     [provider]  cholecalciferol  (VITAMIN D3) 25 MCG (1000 UNIT) tablet Take 1,000 Units by mouth daily.    [provider]  citalopram  (CELEXA ) 10 MG tablet TAKE 1 TABLET BY MOUTH DAILY 09/20/23   [name] HERO, MD  diphenhydrAMINE (BENADRYL) 25 MG tablet Take 25 mg by mouth daily as needed for itching, allergies or sleep.    [provider]  Dulaglutide  (TRULICITY ) 4.5 MG/0.5ML SOPN Inject 4.5 mg as directed once a week. 07/25/23   Shamleffer, Donell Cardinal, MD  FARXIGA  10 MG TABS tablet TAKE 1 TABLET BY MOUTH DAILY. 11/25/23   Shamleffer, Donell Cardinal, MD  ferrous sulfate  324 MG TBEC Take 324 mg by mouth daily at 2 PM.    [provider]  finasteride  (PROSCAR ) 5 MG tablet TAKE 1 TABLET(5 MG) BY MOUTH DAILY 05/11/24   [name] HERO, MD  fluticasone  (FLONASE ) 50 MCG/ACT nasal spray Place 2 sprays into both nostrils daily. 10/16/23   Shelah Lamar RAMAN, MD  fluticasone -salmeterol (WIXELA INHUB) 250-50 MCG/ACT AEPB Inhale 1 puff into the lungs in the morning and at bedtime.    [provider]  guaiFENesin  (MUCINEX ) 600 MG 12 hr  tablet Take 2 tablets (1,200 mg total) by mouth 2 (two) times daily. 01/15/24   Cheryle Page, MD  hydrocortisone  2.5 % cream Apply 1 application  topically daily as needed (Irritation). 11/29/21   [provider]  insulin  aspart (NOVOLOG ) 100 UNIT/ML injection Inject 7-9 Units into the skin 3 (three) times daily before meals. 08/24/21   Kassie Mallick, MD  isosorbide  mononitrate (IMDUR ) 60 MG 24 hr tablet TAKE 1 TABLET BY MOUTH DAILY 12/23/23   [name] HERO, MD  ketoconazole (NIZORAL) 2 % cream Apply 1 application topically daily as needed for irritation.  08/14/13   [provider]  Multiple  Vitamins-Minerals (MULTIVITAMIN PO) Take 1 tablet by mouth daily.    [provider]  nystatin cream (MYCOSTATIN) Apply 1 application  topically 2 (two) times daily as needed for dry skin. 11/29/21   [provider]  OXYGEN  Inhale 2 L into the lungs at bedtime.    [provider]  pantoprazole  (PROTONIX ) 40 MG tablet Take 1 tablet (40 mg total) by mouth 2 (two) times daily before a meal. 07/02/24 08/31/24  [name] HERO, MD  polyethylene glycol powder (GLYCOLAX /MIRALAX ) 17 GM/SCOOP powder Take 17 g by mouth daily as needed (constipation).    [provider]  potassium chloride  (KLOR-CON ) 10 MEQ tablet Take 2 tablets (20 mEq total) by mouth daily. 07/20/24   [name] HERO, MD  Protein JEAN UNFLAVORED PO) Take 8-16 oz by mouth daily.    [provider]  rosuvastatin  (CRESTOR ) 20 MG tablet Take 1 tablet (20 mg total) by mouth daily. 12/24/23   Duke, Jon Garre, PA  Tiotropium Bromide  Monohydrate (SPIRIVA  RESPIMAT) 2.5 MCG/ACT AERS Inhale 1 puff into the lungs daily.    [provider]  torsemide  (DEMADEX ) 20 MG tablet Take 3 tablets (60 mg total) by mouth daily. 07/01/24   Elgergawy, Brayton RAMAN, MD  traZODone  (DESYREL ) 100 MG tablet TAKE 1 TABLET BY MOUTH AT  BEDTIME 02/07/24   [name] HERO, MD    Allergies: Enalapril maleate, Lisinopril, Shellfish-derived products, and Iodine-kelp [iodine]    Review of Systems  Updated Vital Signs BP (!) 142/69 (BP Location: Right Arm)   Pulse (!) 142   Temp 97.8 F (36.6 C) (Oral)   Resp 18   Ht 5' 3 (1.6 m)   Wt 100.2 kg   SpO2 98%   BMI 39.15 kg/m   Physical Exam Vitals and nursing note reviewed.  Constitutional:      General: He is not in acute distress.    Appearance: He is well-developed.  HENT:     Head: Normocephalic and atraumatic.     Right Ear: External ear normal.     Left Ear: External ear normal.     Nose: Nose normal.  Eyes:     Extraocular Movements: Extraocular movements  intact.     Conjunctiva/sclera: Conjunctivae normal.     Pupils: Pupils are equal, round, and reactive to light.  Cardiovascular:     Rate and Rhythm: Tachycardia present. Rhythm irregular.     Heart sounds: Normal heart sounds.  Pulmonary:     Effort: Pulmonary effort is normal. No respiratory distress.     Comments: On 2L [place]. Diminished breath sounds bilaterally. Rales in R base Musculoskeletal:     Cervical back: Normal range of motion and neck supple.     Right lower leg: Edema present.     Left lower leg: Edema present.     Comments: Anasarca  Skin:  General: Skin is warm and dry.  Neurological:     Mental Status: He is alert. Mental status is at baseline.  Psychiatric:        Mood and Affect: Mood normal.        Behavior: Behavior normal.     (all labs ordered are listed, but only abnormal results are displayed) Labs Reviewed  BASIC METABOLIC PANEL WITH GFR  MAGNESIUM  BRAIN NATRIURETIC PEPTIDE  CBC  HEPATIC FUNCTION PANEL    EKG: None  Radiology: No results found.  {Document cardiac monitor, telemetry assessment procedure when appropriate:32947} Procedures   Medications Ordered in the ED  metoprolol  tartrate (LOPRESSOR ) injection 2.5 mg (has no administration in time range)      {Click here for ABCD2, HEART and other calculators REFRESH Note before signing:1}                              Medical Decision Making Amount and/or Complexity of Data Reviewed Labs: ordered. Radiology: ordered.  Risk Prescription drug management. Decision regarding hospitalization.   ***  {Document critical care time when appropriate  Document review of labs and clinical decision tools ie CHADS2VASC2, etc  Document your independent review of radiology images and any outside records  Document your discussion with family members, caretakers and with consultants  Document social determinants of health affecting pt's care  Document your decision making why or why not  admission, treatments were needed:32947:::1}   Final diagnoses:  None    ED Discharge Orders     None
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
## 2024-07-30 NOTE — ED Triage Notes (Signed)
 PT sent from cardiology for Afib and has gained 10lbs in 1 week. Denies chest pain

## 2024-07-31 ENCOUNTER — Encounter (HOSPITAL_COMMUNITY): Payer: Self-pay | Admitting: Internal Medicine

## 2024-07-31 DIAGNOSIS — I482 Chronic atrial fibrillation, unspecified: Secondary | ICD-10-CM

## 2024-07-31 DIAGNOSIS — J9621 Acute and chronic respiratory failure with hypoxia: Secondary | ICD-10-CM

## 2024-07-31 DIAGNOSIS — E1122 Type 2 diabetes mellitus with diabetic chronic kidney disease: Secondary | ICD-10-CM

## 2024-07-31 DIAGNOSIS — Z794 Long term (current) use of insulin: Secondary | ICD-10-CM

## 2024-07-31 DIAGNOSIS — I5043 Acute on chronic combined systolic (congestive) and diastolic (congestive) heart failure: Secondary | ICD-10-CM | POA: Diagnosis not present

## 2024-07-31 DIAGNOSIS — R0602 Shortness of breath: Secondary | ICD-10-CM | POA: Diagnosis not present

## 2024-07-31 DIAGNOSIS — I4819 Other persistent atrial fibrillation: Secondary | ICD-10-CM

## 2024-07-31 DIAGNOSIS — J432 Centrilobular emphysema: Secondary | ICD-10-CM

## 2024-07-31 LAB — CBC
HCT: 36.2 % — ABNORMAL LOW (ref 39.0–52.0)
Hemoglobin: 11.4 g/dL — ABNORMAL LOW (ref 13.0–17.0)
MCH: 30.3 pg (ref 26.0–34.0)
MCHC: 31.5 g/dL (ref 30.0–36.0)
MCV: 96.3 fL (ref 80.0–100.0)
Platelets: 180 K/uL (ref 150–400)
RBC: 3.76 MIL/uL — ABNORMAL LOW (ref 4.22–5.81)
RDW: 14.6 % (ref 11.5–15.5)
WBC: 6.7 K/uL (ref 4.0–10.5)
nRBC: 0 % (ref 0.0–0.2)

## 2024-07-31 LAB — GLUCOSE, CAPILLARY
Glucose-Capillary: 141 mg/dL — ABNORMAL HIGH (ref 70–99)
Glucose-Capillary: 283 mg/dL — ABNORMAL HIGH (ref 70–99)
Glucose-Capillary: 182 mg/dL — ABNORMAL HIGH (ref 70–99)
Glucose-Capillary: 161 mg/dL — ABNORMAL HIGH (ref 70–99)

## 2024-07-31 LAB — BASIC METABOLIC PANEL WITH GFR
Anion gap: 13 (ref 5–15)
BUN: 45 mg/dL — ABNORMAL HIGH (ref 8–23)
CO2: 28 mmol/L (ref 22–32)
Calcium: 9 mg/dL (ref 8.9–10.3)
Chloride: 99 mmol/L (ref 98–111)
Creatinine, Ser: 1.67 mg/dL — ABNORMAL HIGH (ref 0.61–1.24)
GFR, Estimated: 41 mL/min — ABNORMAL LOW (ref >60)
Glucose, Bld: 161 mg/dL — ABNORMAL HIGH (ref 70–99)
Potassium: 4 mmol/L (ref 3.5–5.1)
Sodium: 140 mmol/L (ref 135–145)

## 2024-07-31 MED ORDER — FUROSEMIDE 10 MG/ML IJ SOLN
80.0000 mg | Freq: Three times a day (TID) | INTRAMUSCULAR | Status: DC
  Administered 2024-07-31 – 2024-08-06 (×28): 80 mg via INTRAVENOUS
  Filled 2024-07-31 (×21): qty 8
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
## 2024-07-31 NOTE — Progress Notes (Unsigned)
 This encounter was created in error - please disregard.

## 2024-07-31 NOTE — Plan of Care (Signed)
   Problem: Education: Goal: Knowledge of General Education information will improve Description Including pain rating scale, medication(s)/side effects and non-pharmacologic comfort measures Outcome: Progressing

## 2024-07-31 NOTE — Progress Notes (Signed)
 PROGRESS NOTE    [name].  FMW:[id number] DOB: Mar 14, 1941 DOA: 07/30/2024 PCP: [name] HERO, MD   Brief Narrative:  The patient is an 83 year old Caucasian male with a past medical history significant for [place] to heart failure with preserved ejection fraction, chronic atrial fibrillation, CAD, COPD on home 2 L of oxygen , CKD stage IIIb and other comorbidities who presents with weight gain, dyspnea and 10 pound increase in his weight.  Called his cardiologist who recommended him come to the ED for further evaluation.  Blood work was done and shows that he is acute on chronic CHF and persistent A-fib.  Cardiology consulted and he is being diuresed and current plan is for DCCV on Monday, 08/03/2024.  Assessment and Plan:  Acute on chronic HFpEF: Presents with progressively worsening volume overload and increased weight gain.  Blood pressure has been soft at home and was initiated on IV Lasix  and cardiology is increasing his to 80 mg 3 times daily starting today.  Defer reinitiation of Farxiga  to cardiology.  Home regimen included carvedilol  25 mg twice daily, Farxiga  10 mg daily, Imdur  60 mg once a day and torsemide  60 milligrams daily.  Repeating echo this visit.  Need strict I's and O's and daily weights.  His Imdur  has been held for more blood pressure support.  Cardiology feels that his A-fib may be prolonged into his current decompensation and they are recommending charging his driveway and have scheduled him for DCCV on Monday unless he spontaneously reverts to normal sinus rhythm over the weekend.   Persistent and chronic A-fib with RVR: Heart rate in the low 100s in the ED-given metoprolol . Cautiously continuing Coreg  at 12.5 mg p.o. twice daily with holding parameters-but if blood pressure becomes an issue-may need to be switched to amiodarone.  Cardiology is also discussed with him about the increased risk of bronchospasm with carvedilol  versus more selective beta-blockers.Continue  anticoagulation with apixaban  2.5 mg p.o. twice daily given that his CHA2DS2-VASc score is 8.  Continue with Telemetry monitoring cardiology is planning for DCCV on Monday, 08/03/2024   Essential HTN: BP reportedly soft at home. Holding Imdur  for now. Cautiously continue with Coreg .  Cardiology is increase his Lasix  IV to 80 mg 3 times daily dosing.  Continue to monitor blood pressures per protocol.  Last blood pressure reading was 133/85  HLD: C/w Rosuvastatin  20 mg po Daily    CAD s/p PCI/CTO 2014.: Some shortness of breath secondary to CHF-but no chest pain/anginal symptoms. Suspect not on aspirin  as patient on Eliquis . Continue statin/beta-blocker.  Further care per cardiology   DM-2: Continue sensitive NovoLog  sliding scale insulin  SSI.    CKD stage IIIb: Close to baseline. -BUN/Cr Trend: Recent Labs  Lab 07/09/24 0907 07/15/24 1144 07/30/24 1237 07/31/24 0319  BUN 57* 57* 47* 45*  CREATININE 1.93* 1.71* 1.75* 1.67*  -Monitor UOP -Avoid Nephrotoxic Medications, Contrast Dyes, Hypotension and Dehydration to Ensure Adequate Renal Perfusion and will need to Renally Adjust Meds. CTM and Trend Renal Function carefully and repeat CMP in the AM   BPH: Continue with Finasteride  5 mg po Daily    COPD with chronic hypoxic respiratory failure on home O2-2 L Continue bronchodilators with Breo Ellipta  1 puff IH daily and albuterol  neb 2.5 mg every 2 as needed for wheezing Remains on oxygen -2 L at baseline.   Debility/deconditioning: PT/OT eval  GERD/GI Prophylaxis: C/w PPI w/ Pantoprazole  40 mg BID  Hypoalbuminemia: Patient's Albumin  Lvl is now 3.0 on last check. CTM and Trend  and repeat CMP in the AM  Class II Obesity: Complicates overall prognosis and care. Estimated body mass index is 38.97 kg/m as calculated from the following:   Height as of this encounter: 5' 3 (1.6 m).   Weight as of this encounter: 99.8 kg. Weight Loss and Dietary Counseling given   DVT prophylaxis: apixaban   (ELIQUIS ) tablet 2.5 mg Start: 07/30/24 2200 apixaban  (ELIQUIS ) tablet 2.5 mg    Code Status: Full Code Family Communication: No family currently at bedside  Disposition Plan:  Level of care: Telemetry Cardiac Status is: Inpatient Remains inpatient appropriate because: Needs further clinical improvement and clearance by cardiology   Consultants:  Cardiology  Procedures:  Echocardiogram  Antimicrobials:  Anti-infectives (From admission, onward)    None       Subjective: Seen and examined at bedside thinks he is doing a bit better after getting some Lasix .  Not as short of breath and able to have a conversation about being dyspneic today.  Continues to have significant leg swelling.  No lightheadedness or dizziness.  Thinks he is least 10 pounds positive.  No other concerns or complaints at this time.  Objective: Vitals:   07/31/24 0420 07/31/24 0735 07/31/24 1039 07/31/24 1555  BP: 128/72 (!) 126/58 130/71 133/85  Pulse: (!) 54 82 87 (!) 59  Resp: 18 20 19    Temp: 97.6 F (36.4 C) 97.7 F (36.5 C) 97.7 F (36.5 C) 97.7 F (36.5 C)  TempSrc: Oral Oral Oral Oral  SpO2: 100% 100% 100% 100%  Weight: 99.8 kg     Height:        Intake/Output Summary (Last 24 hours) at 07/31/2024 1859 Last data filed at 07/31/2024 1655 Gross per 24 hour  Intake 960 ml  Output 2075 ml  Net -1115 ml   Filed Weights   07/30/24 1210 07/30/24 1505 07/31/24 0420  Weight: 100.2 kg 100.8 kg 99.8 kg   Examination: Physical Exam:  Constitutional: Elderly obese Caucasian male in no acute distress sitting in a chair Respiratory: Diminished to auscultation bilaterally with some coarse breath sounds and has some crackles.  No appreciable wheezing or rales.  No appreciable rhonchi noted.  Has a normal respiratory effort but is wearing supplemental oxygen  nasal cannula 2 L Cardiovascular: Irregularly irregular and slightly tachycardic, no murmurs / rubs / gallops. S1 and S2 auscultated.  Has 2plus  pitting edema Abdomen: Soft, non-tender, distended secondary to body habitus. Bowel sounds positive.  GU: Deferred. Musculoskeletal: No clubbing / cyanosis of digits/nails. No joint deformity upper and lower extremities.  Skin: No rashes, lesions, ulcers on limited skin evaluation. No induration; Warm and dry.  Neurologic: CN 2-12 grossly intact with no focal deficits. Romberg sign and cerebellar reflexes not assessed.  Psychiatric: Normal judgment and insight. Alert and oriented x 3. Normal mood and appropriate affect.   Data Reviewed: I have personally reviewed following labs and imaging studies  CBC: Recent Labs  Lab 07/30/24 1237 07/31/24 0319  WBC 7.0 6.7  HGB 12.3* 11.4*  HCT 39.3 36.2*  MCV 97.0 96.3  PLT 183 180   Basic Metabolic Panel: Recent Labs  Lab 07/30/24 1237 07/31/24 0319  NA 137 140  K 4.4 4.0  CL 98 99  CO2 27 28  GLUCOSE 107* 161*  BUN 47* 45*  CREATININE 1.75* 1.67*  CALCIUM  9.3 9.0  MG 2.3  --    GFR: Estimated Creatinine Clearance: 35.7 mL/min (A) (by C-G formula based on SCr of 1.67 mg/dL (H)). Liver Function Tests:  Recent Labs  Lab 07/30/24 1237  AST 23  ALT 17  ALKPHOS 55  BILITOT 0.7  PROT 6.0*  ALBUMIN  3.0*   No results for input(s): LIPASE, AMYLASE in the last 168 hours. No results for input(s): AMMONIA in the last 168 hours. Coagulation Profile: No results for input(s): INR, PROTIME in the last 168 hours. Cardiac Enzymes: No results for input(s): CKTOTAL, CKMB, CKMBINDEX, TROPONINI in the last 168 hours. BNP (last 3 results) Recent Labs    06/18/24 0913  PROBNP 151.0*   HbA1C: No results for input(s): HGBA1C in the last 72 hours. CBG: Recent Labs  Lab 07/30/24 2055 07/30/24 2347 07/31/24 0611 07/31/24 1039 07/31/24 1558  GLUCAP 196* 153* 141* 283* 182*   Lipid Profile: No results for input(s): CHOL, HDL, LDLCALC, TRIG, CHOLHDL, LDLDIRECT in the last 72 hours. Thyroid  Function  Tests: No results for input(s): TSH, T4TOTAL, FREET4, T3FREE, THYROIDAB in the last 72 hours. Anemia Panel: No results for input(s): VITAMINB12, FOLATE, FERRITIN, TIBC, IRON, RETICCTPCT in the last 72 hours. Sepsis Labs: No results for input(s): PROCALCITON, LATICACIDVEN in the last 168 hours.  No results found for this or any previous visit (from the past 240 hours).   Radiology Studies: DG Chest Portable 1 View Result Date: 07/30/2024 CLINICAL DATA:  Shortness of breath. EXAM: PORTABLE CHEST 1 VIEW COMPARISON:  June 30, 2024. FINDINGS: Stable cardiomegaly with mild central pulmonary vascular congestion. Minimal bibasilar subsegmental atelectasis or edema is noted. Bony thorax is unremarkable. IMPRESSION: Stable cardiomegaly with mild central pulmonary vascular congestion. Minimal bibasilar subsegmental atelectasis or edema. Electronically Signed   By: Lynwood Landy [name] M.D.   On: 07/30/2024 13:21   Scheduled Meds:  allopurinol   300 mg Oral Daily   apixaban   2.5 mg Oral BID   carvedilol   12.5 mg Oral BID WC   citalopram   10 mg Oral q AM   ferrous sulfate   324 mg Oral Q breakfast   finasteride   5 mg Oral Daily   fluticasone  furoate-vilanterol  1 puff Inhalation Daily   furosemide   80 mg Intravenous TID   guaiFENesin   1,200 mg Oral BID   insulin  aspart  0-9 Units Subcutaneous TID WC   pantoprazole   40 mg Oral BID AC   potassium chloride   20 mEq Oral Daily   rosuvastatin   20 mg Oral Daily   sodium chloride  flush  3 mL Intravenous Q12H   traZODone   100 mg Oral QHS   umeclidinium bromide   1 puff Inhalation Daily   Continuous Infusions:   LOS: 1 day   Alejandro Marker, DO [facility] Available via Epic secure chat 7am-7pm After these hours, please refer to coverage provider listed on [email or web address] 07/31/2024, 6:59 PM

## 2024-07-31 NOTE — Evaluation (Signed)
 Occupational Therapy Evaluation Patient Details Name: [name]. MRN: [id number] DOB: Aug 01, 1941 Today's Date: 07/31/2024   History of Present Illness   83 y.o male admitted 07/30/24 for Afib and volume overload. PMhx: Admission 7/2 for AKI and Rt foot sprain.PMH: COPD on 2L O2, T2DM, CAD, PAF on Eliquis , CKD, CVA with left-sided weakness, chronic HFpEF, Lap-Band, gout, HLD, PVD, skin CA     Clinical Impressions Pt admitted based on above, and was seen based on problem list below. PTA pt was independent with ADLs and IADLs. Today pt is requiring s for safety to min  assist for ADLs. Functional transfers are  s for safety with RW and O2 line. Anticipate pt will progress well, no follow up OT needs. Pt would benefit from continued acute OT services to reinforce education for energy conservation strategies and safety with RW.        If plan is discharge home, recommend the following:   A little help with walking and/or transfers;Assistance with cooking/housework     Functional Status Assessment   Patient has had a recent decline in their functional status and demonstrates the ability to make significant improvements in function in a reasonable and predictable amount of time.     Equipment Recommendations   None recommended by OT      Precautions/Restrictions   Precautions Precautions: Fall;Other (comment) Recall of Precautions/Restrictions: Intact Precaution/Restrictions Comments: Oxygen  Restrictions Weight Bearing Restrictions Per Provider Order: No     Mobility Bed Mobility   General bed mobility comments: Received in standing from mobility specialist    Transfers Overall transfer level: Needs assistance Equipment used: Rolling walker (2 wheels) Transfers: Sit to/from Stand, Bed to chair/wheelchair/BSC Sit to Stand: Supervision     Step pivot transfers: Supervision     General transfer comment: S for safety, cueing for placement of RW. pt with  tendency to furniture surf in narrower spaces      Balance Overall balance assessment: Needs assistance Sitting-balance support: Feet supported, No upper extremity supported Sitting balance-Leahy Scale: Good     Standing balance support: During functional activity, Reliant on assistive device for balance, Single extremity supported, Bilateral upper extremity supported Standing balance-Leahy Scale: Fair Standing balance comment: Reliant on RW for balance       ADL either performed or assessed with clinical judgement   ADL Overall ADL's : Needs assistance/impaired Eating/Feeding: Set up;Sitting   Grooming: Oral care;Wash/dry hands;Wash/dry face;Supervision/safety;Standing           Upper Body Dressing : Set up;Sitting   Lower Body Dressing: Minimal assistance;Sit to/from stand Lower Body Dressing Details (indicate cue type and reason): Min assist for socks, pt reporting props feet up at home to do it Toilet Transfer: Supervision/safety;Rolling walker (2 wheels);Regular Toilet;Ambulation Toilet Transfer Details (indicate cue type and reason): S for safety with RW and O2 cords Toileting- Clothing Manipulation and Hygiene: Supervision/safety;Sit to/from stand Toileting - Clothing Manipulation Details (indicate cue type and reason): Perihygiene seated     Functional mobility during ADLs: Supervision/safety;Rolling walker (2 wheels) General ADL Comments: S for safety, cues for positioning with RW and problem solving     Vision Baseline Vision/History: 1 Wears glasses Patient Visual Report: No change from baseline Vision Assessment?: No apparent visual deficits            Pertinent Vitals/Pain Pain Assessment Pain Assessment: No/denies pain     Extremity/Trunk Assessment Upper Extremity Assessment Upper Extremity Assessment: Generalized weakness   Lower Extremity Assessment Lower Extremity Assessment: Defer to PT  evaluation   Cervical / Trunk Assessment Cervical /  Trunk Assessment: Normal   Communication Communication Communication: No apparent difficulties   Cognition Arousal: Alert Behavior During Therapy: WFL for tasks assessed/performed Cognition: No apparent impairments     OT - Cognition Comments: Poor safety awareness and judgement likely baseline     Following commands: Intact       Cueing  General Comments   Cueing Techniques: Verbal cues;Gestural cues  Session conducted on 3L, O2 levels stable           Home Living Family/patient expects to be discharged to:: Private residence Living Arrangements: Spouse/significant other Available Help at Discharge: Family Type of Home: House Home Access: Ramped entrance     Home Layout: One level     Bathroom Shower/Tub: Producer, television/film/video: Standard Bathroom Accessibility: Yes   Home Equipment: Grab bars - tub/shower;Rolling Environmental consultant (2 wheels);Shower seat;BSC/3in1   Additional Comments: 2L O2 at baseline      Prior Functioning/Environment Prior Level of Function : Independent/Modified Independent;Driving       Mobility Comments: independent in home, limited community distances with RW, transport chair for longer distances ADLs Comments: Pt and wife primary caretakers of adult daughter with MS, HH aid comes by 1x a week for daughter    OT Problem List: Decreased strength;Decreased activity tolerance;Decreased range of motion;Impaired balance (sitting and/or standing);Decreased cognition;Decreased safety awareness;Cardiopulmonary status limiting activity   OT Treatment/Interventions: Self-care/ADL training;Therapeutic exercise;Energy conservation;DME and/or AE instruction;Therapeutic activities;Cognitive remediation/compensation;Patient/family education;Balance training      OT Goals(Current goals can be found in the care plan section)   Acute Rehab OT Goals Patient Stated Goal: To go home OT Goal Formulation: With patient Time For Goal Achievement:  08/14/24 Potential to Achieve Goals: Good   OT Frequency:  Min 2X/week       AM-PAC OT 6 Clicks Daily Activity     Outcome Measure Help from another person eating meals?: None Help from another person taking care of personal grooming?: A Little Help from another person toileting, which includes using toliet, bedpan, or urinal?: A Little Help from another person bathing (including washing, rinsing, drying)?: A Little Help from another person to put on and taking off regular upper body clothing?: A Little Help from another person to put on and taking off regular lower body clothing?: A Little 6 Click Score: 19   End of Session Equipment Utilized During Treatment: Rolling walker (2 wheels);Oxygen  (3L) Nurse Communication: Mobility status  Activity Tolerance: Patient tolerated treatment well Patient left: in chair;with call bell/phone within reach;with chair alarm set  OT Visit Diagnosis: Unsteadiness on feet (R26.81);Other abnormalities of gait and mobility (R26.89);Muscle weakness (generalized) (M62.81)                Time: 9051-8997 OT Time Calculation (min): 14 min Charges:  OT General Charges $OT Visit: 1 Visit OT Evaluation $OT Eval Moderate Complexity: 1 Mod  Pistol Kessenich C, OT  Acute Rehabilitation Services Office [phone] Secure chat preferred   Adrianne GORMAN Savers 07/31/2024, 10:18 AM

## 2024-07-31 NOTE — TOC Initial Note (Signed)
 Transition of Care (TOC) - Initial/Assessment Note    Patient Details  Name: [name]. MRN: [id number] Date of Birth: 1941/10/07  Transition of Care [facility]) CM/SW Contact:    Andrez JULIANNA George, RN Phone Number: 07/31/2024, 3:27 PM  Clinical Narrative:                  Pt is from home with his spouse. They are together all the time.  Pt is active with [facility] for HHPT. Pt will need resumption orders at d/c.  Pt manages his own medications and denies any issues.  Pt states spouse or son provides needed transportation. Pt uses oxygen  at home: 2L [place] through [employer].   IP Care management following.  Expected Discharge Plan: Home w Home Health Services Barriers to Discharge: Continued Medical Work up   Patient Goals and CMS Choice   CMS Medicare.gov Compare Post Acute Care list provided to:: Patient Choice offered to / list presented to : Patient      Expected Discharge Plan and Services   Discharge Planning Services: CM Consult Post Acute Care Choice: Home Health Living arrangements for the past 2 months: Single Family Home                           HH Arranged: PT HH Agency: [facility] Date [facility] Agency Contacted: 07/31/24   Representative spoke with at [facility] Agency: Darleene  Prior Living Arrangements/Services Living arrangements for the past 2 months: Single Family Home Lives with:: Spouse Patient language and need for interpreter reviewed:: Yes Do you feel safe going back to the place where you live?: Yes        Care giver support system in place?: Yes (comment) Current home services: DME, Home PT (walker/ shower seat/ transport chair/ home oxygen ) Criminal Activity/Legal Involvement Pertinent to Current Situation/Hospitalization: No - Comment as needed  Activities of Daily Living   ADL Screening (condition at time of admission) Independently performs ADLs?: Yes (appropriate for developmental age) Is the patient deaf or have difficulty hearing?:  Yes Does the patient have difficulty seeing, even when wearing glasses/contacts?: No Does the patient have difficulty concentrating, remembering, or making decisions?: No  Permission Sought/Granted                  Emotional Assessment Appearance:: Appears stated age Attitude/Demeanor/Rapport: Engaged Affect (typically observed): Accepting Orientation: : Oriented to Self, Oriented to Place, Oriented to  Time, Oriented to Situation   Psych Involvement: No (comment)  Admission diagnosis:  CHF exacerbation (HCC) [I50.9] Patient Active Problem List   Diagnosis Date Noted   CHF exacerbation (HCC) 07/30/2024   Esophageal candidiasis (HCC) 07/02/2024   Abnormal barium swallow 06/26/2024   Acute kidney injury superimposed on chronic kidney disease (HCC) 06/24/2024   Acute on chronic diastolic heart failure (HCC) 06/18/2024   Benign neoplasm of lip 12/13/2023   Esophageal dysphagia 09/20/2023   Meralgia paraesthetica, left 12/20/2022   Type 2 diabetes mellitus with stage 3b chronic kidney disease, with long-term current use of insulin  (HCC) 09/05/2022   Hyperlipidemia 06/19/2022   Sensorineural hearing loss, bilateral 06/19/2022   Primary insomnia 03/20/2022   Ceruminosis, bilateral 01/11/2022   Secondary hypercoagulable state (HCC) 11/23/2021   Malnutrition of moderate degree 11/10/2021   Increased anion gap metabolic acidosis 11/07/2021   PAF (paroxysmal atrial fibrillation) (HCC) 11/07/2021   Chronic kidney disease, stage 3b (HCC) 09/20/2021   History of stroke 09/20/2021   Abnormal x-ray  of lung 08/14/2017   Chronic respiratory failure with hypoxia and hypercapnia (HCC) 09/04/2016   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   Obesity (BMI 30-39.9) 08/30/2014   NSTEMI (non-ST elevated myocardial infarction) (HCC) 07/17/2013   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   Encounter for long-term (current) use of other medications 11/24/2012   History of  laparoscopic adjustable gastric banding 03/20/2012   Depression 12/14/2011   Physical deconditioning 12/14/2011   GERD (gastroesophageal reflux disease) 10/31/2011   Iron deficiency anemia 05/01/2010   History of basal cell carcinoma of skin 10/31/2009   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 07/27/2009   Coronary artery disease 02/02/2009   Chronic diastolic heart failure (HCC) 02/02/2009   Hypokalemia 06/03/2008   Osteoporosis 04/16/2008   Essential hypertension 04/01/2008   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   Peripheral vascular disease (HCC) 09/01/2007   Impotence 05/12/2007   Hemiplegia, 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   Allergic rhinitis 05/12/2007   Osteoarthritis 05/12/2007   PCP:  [name] HERO, MD Pharmacy:   [id number] [place], [place] - [address] AT [facility] [address] [place] [address] Phone: [phone] Fax: [phone]     Social Drivers of Health (SDOH) Social History: SDOH Screenings   Food Insecurity: No Food Insecurity (07/30/2024)  Housing: Low Risk  (07/30/2024)  Transportation Needs: No Transportation Needs (07/31/2024)  Utilities: Not At Risk (07/30/2024)  Alcohol Screen: Low Risk  (07/31/2024)  Depression (PHQ2-9): Low Risk  (07/02/2024)  Recent Concern: Depression (PHQ2-9) - Medium Risk (05/25/2024)  Financial Resource Strain: Low Risk  (07/31/2024)  Physical Activity: Inactive (06/14/2024)  Social Connections: Moderately Isolated (07/30/2024)  Stress: Stress Concern Present (06/14/2024)  Tobacco Use: Medium Risk (07/30/2024)  Health Literacy: Adequate Health Literacy (05/25/2024)   SDOH Interventions: Transportation Interventions: Intervention Not Indicated Alcohol Usage Interventions: Intervention Not Indicated (Score <7) Financial Strain Interventions: Intervention Not  Indicated   Readmission Risk Interventions    01/15/2024    1:16 PM  Readmission Risk Prevention Plan  Transportation Screening Complete  PCP or Specialist Appt within 5-7 Days Complete  Home Care Screening Complete  Medication Review (RN CM) Complete

## 2024-07-31 NOTE — Hospital Course (Addendum)
 The patient is an 83 year old Caucasian male with a past medical history significant for [place] to heart failure with preserved ejection fraction, chronic atrial fibrillation, CAD, COPD on home 2 L of oxygen , CKD stage IIIb and other comorbidities who presents with weight gain, dyspnea and 10 pound increase in his weight.  Called his cardiologist who recommended him come to the ED for further evaluation.  Blood work was done and shows that he is acute on chronic CHF and persistent A-fib.  Cardiology consulted and he is being diuresed as below and he was cardioverted today and converted to normal sinus rhythm.  Subsequently he had some ectopic beats.  Cardiology continues recommend diuresis at this time. Assessment and Plan:  Acute on Chronic HFpEF: Presents with progressively worsening volume overload and increased weight gain.  Blood pressure has been soft at home and was initiated on IV Lasix  and cardiology is increasing his Furosemide  to 80 mg 3 times daily starting today.  Defer reinitiation of Farxiga  to cardiology.  Home regimen included carvedilol  25 mg twice daily, Farxiga  10 mg daily, Imdur  60 mg once a day and torsemide  60 milligrams daily.  Repeating echo this visit and showed that the EF was reduced to 40% and there was somewhat more global hypokinesis and the endocardium was not well-visualized.  Per Cardiologist some of his ECHO studies were somewhat incomplete and they are recommending repeating ECHO in outpatient setting after sinus rhythm has been restored.  Need strict I's and O's and daily weights.  Intake/Output Summary (Last 24 hours) at 08/03/2024 1646 Last data filed at 08/03/2024 0834 Gross per 24 hour  Intake 318.55 ml  Output 1850 ml  Net -1531.45 ml  -His Imdur  had been held for more blood pressure support.  Cardiology took the patient for a DCCV  Persistent and chronic A-fib with RVR post cardioversion and conversion to normal sinus rhythm.: Heart rate in the low 100s in the  ED-given metoprolol . Cautiously continuing Coreg  at 12.5 mg p.o. twice daily with holding parameters-but if blood pressure becomes an issue-may need to be switched to amiodarone.  Cardiology also discussed with him about the increased risk of bronchospasm with carvedilol  versus more selective beta-blockers.Continue anticoagulation with apixaban  2.5 mg p.o. twice daily given that his CHA2DS2-VASc score is 8. Continue with Telemetry monitoring as patient is status post DCCV and now in normal sinus rhythm   Essential HTN: BP reportedly soft at home. Holding Imdur  for now. Cautiously continue with Coreg .  Cardiology is increase his Lasix  IV to 80 mg 3 times daily dosing.  Continue to monitor blood pressures per protocol.  Last blood pressure reading was 144/77  HLD: C/w Rosuvastatin  20 mg po Daily    CAD s/p PCI/CTO 2014.: Some shortness of breath secondary to CHF-but no chest pain/anginal symptoms.  Patient was admitted to have CTO LAD stented RCA suspect that the patient is not on aspirin  as patient on Eliquis . Continue statin/beta-blocker.  Further care per Cardiology.  Currently they are recommending no ischemia workup planned and recommended continue risk reduction   DM-2: Most recent HbA1c was 6.7. Continue sensitive NovoLog  sliding scale insulin  SSI. CBG Trend:  Recent Labs  Lab 08/02/24 1148 08/02/24 1618 08/02/24 2111 08/03/24 0632 08/03/24 1121 08/03/24 1238 08/03/24 1604  GLUCAP 370* 185* 198* 211* 196* 198* 251*  -Adjust Insulin  regimen as necessary and continue to monitor blood sugars per protocol   CKD stage IIIb: Close to baseline. BUN/Cr Trend: Recent Labs  Lab 07/09/24 [phone] 07/15/24 1144 07/30/24  1237 07/31/24 0319 08/01/24 0232 08/02/24 0224 08/03/24 1337  BUN 57* 57* 47* 45* 43* 43* 42*  CREATININE 1.93* 1.71* 1.75* 1.67* 1.63* 1.71* 1.86*  -Monitor UOP as he is being diuresed. Avoid Nephrotoxic Medications, Contrast Dyes, Hypotension and Dehydration to Ensure Adequate  Renal Perfusion and will need to Renally Adjust Meds. CTM and Trend Renal Function carefully and repeat CMP in the AM   BPH: Continue with Finasteride  5 mg po Daily   Normocytic Anemia: Hgb/Hct Trend:  Recent Labs  Lab 07/09/24 0907 07/30/24 1237 07/31/24 0319 08/01/24 0232 08/02/24 0224 08/03/24 1337  HGB 12.3* 12.3* 11.4* 12.3* 12.1* 12.3*  HCT 37.7* 39.3 36.2* 38.8* 39.0 40.1  MCV 92.5 97.0 96.3 96.0 96.8 96.9  -Check Anemia Panel in the AM. CTM for S/Sx of Bleeding; No overt bleeding noted. Repeat CBC in the AM   COPD with chronic hypoxic respiratory failure on home O2-2 L Continue bronchodilators with Breo Ellipta  1 puff IH daily and albuterol  neb 2.5 mg every 2 as needed for wheezing. Remains on oxygen -2 L at baseline. Will need Ambulatory O2 Screen prior to D/C  Constipation: Has not had a bowel movement in about 6 days despite taking as needed MiraLAX  and bisacodyl .  Will schedule MiraLAX  17 g p.o. twice daily and add senna docusate 1 tab p.o. twice daily and as well as provide as needed 10 mg bisacodyl  suppositories rectally   Debility/deconditioning: PT/OT eval recommending Home Health   GERD/GI Prophylaxis: C/w PPI w/ Pantoprazole  40 mg BID  Hypoalbuminemia: Patient's Albumin  Lvl is now 3.0 on last check. CTM and Trend and repeat CMP in the AM  Class II Obesity: Complicates overall prognosis and care. Estimated body mass index is 39.5 kg/m as calculated from the following:   Height as of this encounter: 5' 3 (1.6 m).   Weight as of this encounter: 101.2 kg. Weight Loss and Dietary Counseling given

## 2024-07-31 NOTE — Progress Notes (Signed)
   07/31/24 1115  Spiritual Encounters  Type of Visit Initial  Care provided to: Patient  Referral source Nurse (RN/NT/LPN)  Reason for visit Advance directives  OnCall Visit No  Interventions  Spiritual Care Interventions Made Established relationship of care and support;Compassionate presence;Reflective listening;Encouragement;Normalization of emotions  Intervention Outcomes  Outcomes Awareness of support   Chaplain responded to Spiritual Consult for Advance Care Directive.  Chaplain arrived in room and offered ACD education. Patient declined noting he already had the information at home and was discussing the topic with his wife.  Instructed Pt how to reach out to Spiritual Care if they have any questions and to contact us  when they are ready to move forward.  Chaplain services remain available as the need arises by Spiritual Consult or for emergent cases, paging [phone].  Chaplain Dru Primeau, LPN, CHC, ORDM

## 2024-07-31 NOTE — Plan of Care (Signed)
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
## 2024-07-31 NOTE — Evaluation (Signed)
 Physical Therapy Evaluation Patient Details Name: [name]. MRN: [id number] DOB: 1941/12/23 Today's Date: 07/31/2024  History of Present Illness  83 y.o male admitted 07/30/24 for Afib and volume overload. PMhx: Admission 7/2 for AKI and Rt foot sprain.PMH: COPD on 2L O2, T2DM, CAD, PAF on Eliquis , CKD, CVA with left-sided weakness, chronic HFpEF, Lap-Band, gout, HLD, PVD, skin CA  Clinical Impression  Pt presents with decreased activity tolerance due to CHF exacerbation. Pt to benefit from acute PT to address deficits. Pt ambulated a short household distance which pt reports is not where his baseline was. Pt's SpO2 remained steady during ambulation on 4L O2. Educated pt on walking program and he is aware of CHF signs. Pt to benefit from continued PT services to increased functional endurance. PT to progress mobility as tolerated, and will continue to follow acutely.       SpO2 98% while ambulating on 4L. Decreased to 3L at rest and remained 98%     If plan is discharge home, recommend the following: A little help with walking and/or transfers;A little help with bathing/dressing/bathroom;Assistance with cooking/housework;Assist for transportation   Can travel by private vehicle        Equipment Recommendations None recommended by PT  Recommendations for Other Services       Functional Status Assessment Patient has had a recent decline in their functional status and demonstrates the ability to make significant improvements in function in a reasonable and predictable amount of time.     Precautions / Restrictions Precautions Precautions: Fall;Other (comment) Precaution/Restrictions Comments: Oxygen  Restrictions Weight Bearing Restrictions Per Provider Order: No      Mobility  Bed Mobility               General bed mobility comments: Seated EOB at the start of session and in recliner at the end of session    Transfers Overall transfer level: Needs  assistance Equipment used: Rolling walker (2 wheels) Transfers: Sit to/from Stand Sit to Stand: Contact guard assist           General transfer comment: For safety to the steady    Ambulation/Gait Ambulation/Gait assistance: Min assist Gait Distance (Feet): 60 Feet Assistive device: Rolling walker (2 wheels) Gait Pattern/deviations: Step-through pattern, Decreased stride length, Trunk flexed       General Gait Details: Cues for walker management  Stairs            Wheelchair Mobility     Tilt Bed    Modified Rankin (Stroke Patients Only)       Balance Overall balance assessment: Needs assistance Sitting-balance support: Feet supported, No upper extremity supported Sitting balance-Leahy Scale: Good     Standing balance support: During functional activity, Reliant on assistive device for balance, Single extremity supported, Bilateral upper extremity supported Standing balance-Leahy Scale: Fair Standing balance comment: Evidence of furniture surfing as he grabs onto the bed and wall as he walks towards walker. Reliant on RW for balance                             Pertinent Vitals/Pain Pain Assessment Pain Assessment: No/denies pain    Home Living Family/patient expects to be discharged to:: Private residence Living Arrangements: Spouse/significant other Available Help at Discharge: Family Type of Home: House Home Access: Ramped entrance       Home Layout: One level Home Equipment: Grab bars - tub/shower;Shower Counsellor (2 wheels)      Prior  Function Prior Level of Function : Independent/Modified Independent;Driving               ADLs Comments: 2L O2 at baseline     Extremity/Trunk Assessment   Upper Extremity Assessment Upper Extremity Assessment: Defer to OT evaluation    Lower Extremity Assessment Lower Extremity Assessment: Generalized weakness    Cervical / Trunk Assessment Cervical / Trunk Assessment:  Normal  Communication   Communication Communication: No apparent difficulties    Cognition Arousal: Alert Behavior During Therapy: WFL for tasks assessed/performed   PT - Cognitive impairments: No apparent impairments                         Following commands: Intact       Cueing Cueing Techniques: Verbal cues, Gestural cues     General Comments General comments (skin integrity, edema, etc.): SpO2 98% while ambulating on 4L. Decreased to 3L at rest and remained 98%    Exercises General Exercises - Lower Extremity Ankle Circles/Pumps: AROM, 10 reps, Seated, Both Long Arc Quad: AROM, Both, 10 reps, Seated   Assessment/Plan    PT Assessment Patient needs continued PT services  PT Problem List Cardiopulmonary status limiting activity;Decreased activity tolerance;Decreased balance       PT Treatment Interventions Gait training;Therapeutic activities;Functional mobility training;Therapeutic exercise    PT Goals (Current goals can be found in the Care Plan section)  Acute Rehab PT Goals Patient Stated Goal: Return home PT Goal Formulation: With patient Time For Goal Achievement: 08/14/24 Potential to Achieve Goals: Good    Frequency Min 3X/week     Co-evaluation               AM-PAC PT 6 Clicks Mobility  Outcome Measure Help needed turning from your back to your side while in a flat bed without using bedrails?: A Little Help needed moving from lying on your back to sitting on the side of a flat bed without using bedrails?: A Little Help needed moving to and from a bed to a chair (including a wheelchair)?: A Little Help needed standing up from a chair using your arms (e.g., wheelchair or bedside chair)?: A Little Help needed to walk in hospital room?: A Little Help needed climbing 3-5 steps with a railing? : A Lot 6 Click Score: 17    End of Session Equipment Utilized During Treatment: Gait belt Activity Tolerance: Patient tolerated treatment  well Patient left: in chair;with call bell/phone within reach;with chair alarm set Nurse Communication: Mobility status (Tritrate down to 3L O2) PT Visit Diagnosis: Unsteadiness on feet (R26.81);Muscle weakness (generalized) (M62.81)    Time: 9180-9157 PT Time Calculation (min) (ACUTE ONLY): 23 min   Charges:   PT Evaluation $PT Eval Moderate Complexity: 1 Mod PT Treatments $Gait Training: 8-22 mins PT General Charges $$ ACUTE PT VISIT: 1 Visit         Quintin Campi, SPT  Acute Rehab  [phone]   Quintin Campi 07/31/2024, 10:01 AM

## 2024-08-01 ENCOUNTER — Inpatient Hospital Stay (HOSPITAL_COMMUNITY)

## 2024-08-01 DIAGNOSIS — R0602 Shortness of breath: Secondary | ICD-10-CM | POA: Diagnosis not present

## 2024-08-01 DIAGNOSIS — I482 Chronic atrial fibrillation, unspecified: Secondary | ICD-10-CM | POA: Diagnosis not present

## 2024-08-01 DIAGNOSIS — I5043 Acute on chronic combined systolic (congestive) and diastolic (congestive) heart failure: Secondary | ICD-10-CM | POA: Diagnosis not present

## 2024-08-01 LAB — ECHOCARDIOGRAM COMPLETE
AR max vel: 2.32 cm2
AV Area VTI: 1.79 cm2
AV Area mean vel: 2.18 cm2
AV Mean grad: 3.1 mmHg
AV Peak grad: 5.7 mmHg
Ao pk vel: 1.19 m/s
Area-P 1/2: 5.38 cm2
Calc EF: 50.3 %
Est EF: 40
Height: 63 in
S' Lateral: 3.4 cm
Single Plane A2C EF: 40.8 %
Single Plane A4C EF: 53.4 %
Weight: 3555.58 [oz_av]

## 2024-08-01 LAB — GLUCOSE, CAPILLARY
Glucose-Capillary: 140 mg/dL — ABNORMAL HIGH (ref 70–99)
Glucose-Capillary: 181 mg/dL — ABNORMAL HIGH (ref 70–99)
Glucose-Capillary: 375 mg/dL — ABNORMAL HIGH (ref 70–99)
Glucose-Capillary: 165 mg/dL — ABNORMAL HIGH (ref 70–99)
Glucose-Capillary: 152 mg/dL — ABNORMAL HIGH (ref 70–99)

## 2024-08-01 LAB — CBC WITH DIFFERENTIAL/PLATELET
Abs Immature Granulocytes: 0.02 K/uL (ref 0.00–0.07)
Basophils Absolute: 0 K/uL (ref 0.0–0.1)
Basophils Relative: 0 %
Eosinophils Absolute: 0.1 K/uL (ref 0.0–0.5)
Eosinophils Relative: 2 %
HCT: 38.8 % — ABNORMAL LOW (ref 39.0–52.0)
Hemoglobin: 12.3 g/dL — ABNORMAL LOW (ref 13.0–17.0)
Immature Granulocytes: 0 %
Lymphocytes Relative: 16 %
Lymphs Abs: 1.1 K/uL (ref 0.7–4.0)
MCH: 30.4 pg (ref 26.0–34.0)
MCHC: 31.7 g/dL (ref 30.0–36.0)
MCV: 96 fL (ref 80.0–100.0)
Monocytes Absolute: 0.9 K/uL (ref 0.1–1.0)
Monocytes Relative: 13 %
Neutro Abs: 4.7 K/uL (ref 1.7–7.7)
Neutrophils Relative %: 69 %
Platelets: 186 K/uL (ref 150–400)
RBC: 4.04 MIL/uL — ABNORMAL LOW (ref 4.22–5.81)
RDW: 14.6 % (ref 11.5–15.5)
WBC: 6.8 K/uL (ref 4.0–10.5)
nRBC: 0 % (ref 0.0–0.2)

## 2024-08-01 LAB — COMPREHENSIVE METABOLIC PANEL WITH GFR
ALT: 15 U/L (ref 0–44)
AST: 11 U/L — ABNORMAL LOW (ref 15–41)
Albumin: 3.1 g/dL — ABNORMAL LOW (ref 3.5–5.0)
Alkaline Phosphatase: 52 U/L (ref 38–126)
Anion gap: 9 (ref 5–15)
BUN: 43 mg/dL — ABNORMAL HIGH (ref 8–23)
CO2: 31 mmol/L (ref 22–32)
Calcium: 8.8 mg/dL — ABNORMAL LOW (ref 8.9–10.3)
Chloride: 98 mmol/L (ref 98–111)
Creatinine, Ser: 1.63 mg/dL — ABNORMAL HIGH (ref 0.61–1.24)
GFR, Estimated: 42 mL/min — ABNORMAL LOW (ref >60)
Glucose, Bld: 136 mg/dL — ABNORMAL HIGH (ref 70–99)
Potassium: 3.6 mmol/L (ref 3.5–5.1)
Sodium: 138 mmol/L (ref 135–145)
Total Bilirubin: 0.8 mg/dL (ref 0.0–1.2)
Total Protein: 5.8 g/dL — ABNORMAL LOW (ref 6.5–8.1)

## 2024-08-01 LAB — BRAIN NATRIURETIC PEPTIDE: B Natriuretic Peptide: 588.3 pg/mL — ABNORMAL HIGH (ref 0.0–100.0)

## 2024-08-01 LAB — PHOSPHORUS: Phosphorus: 3.5 mg/dL (ref 2.5–4.6)

## 2024-08-01 LAB — MAGNESIUM: Magnesium: 2.3 mg/dL (ref 1.7–2.4)

## 2024-08-01 NOTE — Progress Notes (Signed)
 PROGRESS NOTE    [name].  FMW:[id number] DOB: 14-Jun-1941 DOA: 07/30/2024 PCP: [name] HERO, MD   Brief Narrative:  The patient is an 83 year old Caucasian male with a past medical history significant for Padonda to heart failure with preserved ejection fraction, chronic atrial fibrillation, CAD, COPD on home 2 L of oxygen , CKD stage IIIb and other comorbidities who presents with weight gain, dyspnea and 10 pound increase in his weight.  Called his cardiologist who recommended him come to the ED for further evaluation.  Blood work was done and shows that he is acute on chronic CHF and persistent A-fib.  Cardiology consulted and he is being diuresed as below and current plan is for DCCV on Monday, 08/03/2024.  Assessment and Plan:  Acute on Chronic HFpEF: Presents with progressively worsening volume overload and increased weight gain.  Blood pressure has been soft at home and was initiated on IV Lasix  and cardiology is increasing his Furosemide  to 80 mg 3 times daily starting today.  Defer reinitiation of Farxiga  to cardiology.  Home regimen included carvedilol  25 mg twice daily, Farxiga  10 mg daily, Imdur  60 mg once a day and torsemide  60 milligrams daily.  Repeating echo this visit and showed that the EF was reduced to 40% and there was somewhat more global hypokinesis and the endocardium was not well-visualized.  Per cardiologist evaluate studies were somewhat incomplete and they are recommending repeating echo in outpatient setting after sinus rhythm has been restored..  Need strict I's and O's and daily weights.  Intake/Output Summary (Last 24 hours) at 08/01/2024 1619 Last data filed at 08/01/2024 1512 Gross per 24 hour  Intake 240 ml  Output 2780 ml  Net -2540 ml  -His Imdur  has been held for more blood pressure support.  Cardiology feels that his A-fib may be prolonged into his current decompensation and they are recommending charging his driveway and have scheduled him for DCCV on  Monday unless he spontaneously reverts to normal sinus rhythm over the weekend.   Persistent and chronic A-fib with RVR: Heart rate in the low 100s in the ED-given metoprolol . Cautiously continuing Coreg  at 12.5 mg p.o. twice daily with holding parameters-but if blood pressure becomes an issue-may need to be switched to amiodarone.  Cardiology also discussed with him about the increased risk of bronchospasm with carvedilol  versus more selective beta-blockers.Continue anticoagulation with apixaban  2.5 mg p.o. twice daily given that his CHA2DS2-VASc score is 8.  Continue with Telemetry monitoring cardiology is planning for DCCV on Monday, 08/03/2024   Essential HTN: BP reportedly soft at home. Holding Imdur  for now. Cautiously continue with Coreg .  Cardiology is increase his Lasix  IV to 80 mg 3 times daily dosing.  Continue to monitor blood pressures per protocol.  Last blood pressure reading was 113/85  HLD: C/w Rosuvastatin  20 mg po Daily    CAD s/p PCI/CTO 2014.: Some shortness of breath secondary to CHF-but no chest pain/anginal symptoms.  Patient was admitted to have CTO LAD stented RCA suspect that the patient is not on aspirin  as patient on Eliquis . Continue statin/beta-blocker.  Further care per Cardiology.  Currently they are recommending no ischemia workup planned and recommended continue risk reduction   DM-2: Continue sensitive NovoLog  sliding scale insulin  SSI. CBG Trend:  Recent Labs  Lab 07/31/24 1039 07/31/24 1558 07/31/24 2104 08/01/24 0551 08/01/24 0805 08/01/24 1125 08/01/24 1549  GLUCAP 283* 182* 161* 140* 181* 375* 165*  -Adjust Insulin  regimen as necessary.    CKD stage IIIb:  Close to baseline. BUN/Cr Trend: Recent Labs  Lab 07/09/24 0907 07/15/24 1144 07/30/24 1237 07/31/24 0319 08/01/24 0232  BUN 57* 57* 47* 45* 43*  CREATININE 1.93* 1.71* 1.75* 1.67* 1.63*  -Monitor UOP. Avoid Nephrotoxic Medications, Contrast Dyes, Hypotension and Dehydration to Ensure Adequate  Renal Perfusion and will need to Renally Adjust Meds. CTM and Trend Renal Function carefully and repeat CMP in the AM   BPH: Continue with Finasteride  5 mg po Daily    COPD with chronic hypoxic respiratory failure on home O2-2 L Continue bronchodilators with Breo Ellipta  1 puff IH daily and albuterol  neb 2.5 mg every 2 as needed for wheezing. Remains on oxygen -2 L at baseline. Will need Ambulatory O2 Screen prior to D/C   Debility/deconditioning: PT/OT eval recommending Home Health   GERD/GI Prophylaxis: C/w PPI w/ Pantoprazole  40 mg BID  Hypoalbuminemia: Patient's Albumin  Lvl is now 3.0 on last check. CTM and Trend and repeat CMP in the AM  Class II Obesity: Complicates overall prognosis and care. Estimated body mass index is 39.01 kg/m as calculated from the following:   Height as of this encounter: 5' 3 (1.6 m).   Weight as of this encounter: 99.9 kg. Weight Loss and Dietary Counseling given   DVT prophylaxis: Place TED hose Start: 08/01/24 0939 apixaban  (ELIQUIS ) tablet 2.5 mg Start: 07/30/24 2200 apixaban  (ELIQUIS ) tablet 2.5 mg    Code Status: Full Code Family Communication: Family present @ bedside  Disposition Plan:  Level of care: Telemetry Cardiac Status is: Inpatient Remains inpatient appropriate because: Needs further clinical improvement and clearance by cardiology as he will be going under a cardioversion   Consultants:  Cardiology  Procedures:  ECHOCARDIOGRAM  Antimicrobials:  Anti-infectives (From admission, onward)    None       Subjective: Seen and examined at bedside and he is ambulating from the bathroom and states that his last dose of Lasix  last night made him anxious and states that he is not urinating very much now.  No nausea or vomiting.  Not as short of breath.  Doing okay.  Continues to have some swelling in his legs.  No other concerns or complaints at this time.  Objective: Vitals:   08/01/24 0500 08/01/24 0811 08/01/24 0820 08/01/24 1502   BP: 109/66  127/66 113/85  Pulse: 86  64   Resp: 19  18 18   Temp: 98.6 F (37 C)  (!) 97.4 F (36.3 C) 98.4 F (36.9 C)  TempSrc: Oral  Oral Oral  SpO2: 92% 100% 100% 99%  Weight: 99.9 kg     Height:        Intake/Output Summary (Last 24 hours) at 08/01/2024 1620 Last data filed at 08/01/2024 1512 Gross per 24 hour  Intake 240 ml  Output 2780 ml  Net -2540 ml   Filed Weights   07/30/24 1505 07/31/24 0420 08/01/24 0500  Weight: 100.8 kg 99.8 kg 99.9 kg   Examination: Physical Exam:  Constitutional: Elderly obese Caucasian male in no acute distress ambulating back to the bathroom Respiratory: Diminished to auscultation bilaterally, no wheezing, rales, rhonchi or crackles. Normal respiratory effort and patient is not tachypenic. No accessory muscle use.  Unlabored breathing Cardiovascular: Irregularly irregular and mildly tachycardic, no murmurs / rubs / gallops. S1 and S2 auscultated.  1+ extremity pitting Abdomen: Soft, non-tender, distended secondary to body habitus. Bowel sounds positive.  GU: Deferred. Musculoskeletal: No clubbing / cyanosis of digits/nails. No joint deformity upper and lower extremities.  Skin: No rashes, lesions, ulcers  on limited skin evaluation. No induration; Warm and dry.  Neurologic: CN 2-12 grossly intact with no focal deficits. Romberg sign and cerebellar reflexes not assessed.  Psychiatric: Normal judgment and insight. Alert and oriented x 3. Normal mood and appropriate affect.   Data Reviewed: I have personally reviewed following labs and imaging studies  CBC: Recent Labs  Lab 07/30/24 1237 07/31/24 0319 08/01/24 0232  WBC 7.0 6.7 6.8  NEUTROABS  --   --  4.7  HGB 12.3* 11.4* 12.3*  HCT 39.3 36.2* 38.8*  MCV 97.0 96.3 96.0  PLT 183 180 186   Basic Metabolic Panel: Recent Labs  Lab 07/30/24 1237 07/31/24 0319 08/01/24 0232  NA 137 140 138  K 4.4 4.0 3.6  CL 98 99 98  CO2 27 28 31   GLUCOSE 107* 161* 136*  BUN 47* 45* 43*   CREATININE 1.75* 1.67* 1.63*  CALCIUM  9.3 9.0 8.8*  MG 2.3  --  2.3  PHOS  --   --  3.5   GFR: Estimated Creatinine Clearance: 36.6 mL/min (A) (by C-G formula based on SCr of 1.63 mg/dL (H)). Liver Function Tests: Recent Labs  Lab 07/30/24 1237 08/01/24 0232  AST 23 11*  ALT 17 15  ALKPHOS 55 52  BILITOT 0.7 0.8  PROT 6.0* 5.8*  ALBUMIN  3.0* 3.1*   No results for input(s): LIPASE, AMYLASE in the last 168 hours. No results for input(s): AMMONIA in the last 168 hours. Coagulation Profile: No results for input(s): INR, PROTIME in the last 168 hours. Cardiac Enzymes: No results for input(s): CKTOTAL, CKMB, CKMBINDEX, TROPONINI in the last 168 hours. BNP (last 3 results) Recent Labs    06/18/24 0913  PROBNP 151.0*   HbA1C: No results for input(s): HGBA1C in the last 72 hours. CBG: Recent Labs  Lab 07/31/24 2104 08/01/24 0551 08/01/24 0805 08/01/24 1125 08/01/24 1549  GLUCAP 161* 140* 181* 375* 165*   Lipid Profile: No results for input(s): CHOL, HDL, LDLCALC, TRIG, CHOLHDL, LDLDIRECT in the last 72 hours. Thyroid  Function Tests: No results for input(s): TSH, T4TOTAL, FREET4, T3FREE, THYROIDAB in the last 72 hours. Anemia Panel: No results for input(s): VITAMINB12, FOLATE, FERRITIN, TIBC, IRON , RETICCTPCT in the last 72 hours. Sepsis Labs: No results for input(s): PROCALCITON, LATICACIDVEN in the last 168 hours.  No results found for this or any previous visit (from the past 240 hours).   Radiology Studies: DG CHEST PORT 1 VIEW Result Date: 08/01/2024 CLINICAL DATA:  Shortness of breath. EXAM: PORTABLE CHEST 1 VIEW COMPARISON:  07/30/2024 FINDINGS: Cardiomegaly is stable. Increasing hazy opacity at the lung bases likely increasing pleural effusions. Worsening vascular congestion. No pneumothorax or confluent airspace disease. IMPRESSION: Cardiomegaly with worsening vascular congestion and increasing pleural  effusions. Electronically Signed   By: Andrea Gasman M.D.   On: 08/01/2024 10:56   ECHOCARDIOGRAM COMPLETE Result Date: 08/01/2024    ECHOCARDIOGRAM REPORT   Patient Name:   [name]. Date of Exam: 07/30/2024 Medical Rec #:  [id number]         Height:       63.0 in Accession #:    [id number]        Weight:       222.2 lb Date of Birth:  18-Jun-1941         BSA:          2.022 m Patient Age:    83 years          BP:  124/73 mmHg Patient Gender: M                 HR:           77 bpm. Exam Location:  Inpatient Procedure: 2D Echo, Cardiac Doppler, Color Doppler and Intracardiac            Opacification Agent (Both Spectral and Color Flow Doppler were            utilized during procedure). Indications:    CHF- Acute Diastolic I50.31  History:        Patient has prior history of Echocardiogram examinations, most                 recent 11/08/2021. CHF, CAD, COPD, Stroke and CKD, stage 3,                 Arrythmias:Atrial Fibrillation; Risk Factors:Diabetes and                 Dyslipidemia.  Sonographer:    Thea Norlander RCS Referring Phys: [id number] DONALDA M GHIMIRE IMPRESSIONS  1. Technically difficult study, even with the use of echo contrast. Afib with mildly elevated rates as well limit interpretation LVEF appears to be mildly reduced at 40%. . Left ventricular ejection fraction, by estimation, is 40%. The left ventricle has mildly decreased function. The left ventricle demonstrates regional wall motion abnormalities (see scoring diagram/findings for description). Left ventricular diastolic parameters are indeterminate.  2. RV not well visualized, grossly appears normal in size with low normal function. . Right ventricular systolic function was not well visualized. The right ventricular size is not well visualized.  3. Left atrial size was moderately dilated.  4. Right atrial size was moderately dilated.  5. The mitral valve is abnormal. Mild mitral valve regurgitation. No evidence of mitral stenosis.   6. The tricuspid valve is abnormal.  7. The aortic valve was not well visualized. Aortic valve regurgitation is not visualized. No aortic stenosis is present.  8. The inferior vena cava is normal in size with greater than 50% respiratory variability, suggesting right atrial pressure of 3 mmHg. FINDINGS  Left Ventricle: Technically difficult study, even with the use of echo contrast. Afib with mildly elevated rates as well limit interpretation LVEF appears to be mildly reduced at 40%. Left ventricular ejection fraction, by estimation, is 40%. The left ventricle has mildly decreased function. The left ventricle demonstrates regional wall motion abnormalities. Definity  contrast agent was given IV to delineate the left ventricular endocardial borders. The left ventricular internal cavity size was normal in size. There is no left ventricular hypertrophy. Left ventricular diastolic parameters are indeterminate.  LV Wall Scoring: The entire anterior septum, apical anterior segment, and apex are hypokinetic. Right Ventricle: RV not well visualized, grossly appears normal in size with low normal function. The right ventricular size is not well visualized. Right vetricular wall thickness was not well visualized. Right ventricular systolic function was not well  visualized. Left Atrium: Left atrial size was moderately dilated. Right Atrium: Right atrial size was moderately dilated. Pericardium: There is no evidence of pericardial effusion. Mitral Valve: The mitral valve is abnormal. There is mild thickening of the mitral valve leaflet(s). There is mild calcification of the mitral valve leaflet(s). Mild mitral annular calcification. Mild mitral valve regurgitation. No evidence of mitral valve stenosis. Tricuspid Valve: The tricuspid valve is abnormal. Tricuspid valve regurgitation is mild . No evidence of tricuspid stenosis. Aortic Valve: The aortic valve was not well visualized. Aortic valve  regurgitation is not visualized.  No aortic stenosis is present. Aortic valve mean gradient measures 3.1 mmHg. Aortic valve peak gradient measures 5.7 mmHg. Aortic valve area, by VTI measures 1.79 cm. Pulmonic Valve: The pulmonic valve was not well visualized. Pulmonic valve regurgitation is not visualized. No evidence of pulmonic stenosis. Aorta: The aortic root and ascending aorta are structurally normal, with no evidence of dilitation. Venous: The inferior vena cava is normal in size with greater than 50% respiratory variability, suggesting right atrial pressure of 3 mmHg. IAS/Shunts: No atrial level shunt detected by color flow Doppler.  LEFT VENTRICLE PLAX 2D LVIDd:         4.90 cm      Diastology LVIDs:         3.40 cm      LV e' medial:   8.03 cm/s LV PW:         1.10 cm      LV E/e' medial: 18.1 LV IVS:        0.70 cm LVOT diam:     2.00 cm LV SV:         53 LV SV Index:   26 LVOT Area:     3.14 cm  LV Volumes (MOD) LV vol d, MOD A2C: 88.2 ml LV vol d, MOD A4C: 103.0 ml LV vol s, MOD A2C: 52.3 ml LV vol s, MOD A4C: 48.0 ml LV SV MOD A2C:     35.9 ml LV SV MOD A4C:     103.0 ml LV SV MOD BP:      50.7 ml RIGHT VENTRICLE RV S prime:     7.61 cm/s TAPSE (M-mode): 1.3 cm LEFT ATRIUM           Index        RIGHT ATRIUM           Index LA diam:      5.30 cm 2.62 cm/m   RA Area:     27.20 cm LA Vol (A4C): 92.8 ml 45.89 ml/m  RA Volume:   90.80 ml  44.90 ml/m  AORTIC VALVE AV Area (Vmax):    2.32 cm AV Area (Vmean):   2.18 cm AV Area (VTI):     1.79 cm AV Vmax:           119.00 cm/s AV Vmean:          83.693 cm/s AV VTI:            0.295 m AV Peak Grad:      5.7 mmHg AV Mean Grad:      3.1 mmHg LVOT Vmax:         88.00 cm/s LVOT Vmean:        58.100 cm/s LVOT VTI:          0.168 m LVOT/AV VTI ratio: 0.57  AORTA Ao Root diam: 3.30 cm Ao Asc diam:  3.20 cm MITRAL VALVE MV Area (PHT): 5.38 cm     SHUNTS MV Decel Time: 141 msec     Systemic VTI:  0.17 m MV E velocity: 145.00 cm/s  Systemic Diam: 2.00 cm Dorn Ross MD Electronically signed  by Dorn Ross MD Signature Date/Time: 08/01/2024/8:23:56 AM    Final    Scheduled Meds:  allopurinol   300 mg Oral Daily   apixaban   2.5 mg Oral BID   carvedilol   12.5 mg Oral BID WC   citalopram   10 mg Oral q AM   ferrous sulfate   324 mg Oral  Q breakfast   finasteride   5 mg Oral Daily   fluticasone  furoate-vilanterol  1 puff Inhalation Daily   furosemide   80 mg Intravenous TID   guaiFENesin   1,200 mg Oral BID   insulin  aspart  0-9 Units Subcutaneous TID WC   pantoprazole   40 mg Oral BID AC   potassium chloride   20 mEq Oral Daily   rosuvastatin   20 mg Oral Daily   sodium chloride  flush  3 mL Intravenous Q12H   traZODone   100 mg Oral QHS   umeclidinium bromide   1 puff Inhalation Daily   Continuous Infusions:   LOS: 2 days   Alejandro Marker, DO [facility] Available via Epic secure chat 7am-7pm After these hours, please refer to coverage provider listed on [email or web address] 08/01/2024, 4:20 PM

## 2024-08-01 NOTE — Progress Notes (Signed)
 Mobility Specialist Progress Note;   08/01/24 1020  Mobility  Activity Ambulated with assistance  Level of Assistance Standby assist, set-up cues, supervision of patient - no hands on  Assistive Device Front wheel walker  Distance Ambulated (ft) 50 ft  Activity Response Tolerated well  Mobility Referral Yes  Mobility visit 1 Mobility  Mobility Specialist Start Time (ACUTE ONLY) 1020  Mobility Specialist Stop Time (ACUTE ONLY) 1032  Mobility Specialist Time Calculation (min) (ACUTE ONLY) 12 min   Pt agreeable to mobility. On 3LO2 upon arrival. Required no physical assistance during ambulation, SV. Ambulated on 4LO2 to maintain SPO2 92%>. Took 1x seated rest break d/t SOB. Pt returned to room and requested to use BR. Pt instructed to pull call light once finished. NT notified.   Lauraine Erm Mobility Specialist Please contact via SecureChat or [employer] [phone]

## 2024-08-01 NOTE — Progress Notes (Signed)
 Progress Note  Patient Name: [name]. Date of Encounter: 08/01/2024  Primary Cardiologist:   Lonni Cash, MD   Subjective   He says that he felt anxious last night after the third dose of Lasix .  No pain or acute SOB.   Inpatient Medications    Scheduled Meds:  allopurinol   300 mg Oral Daily   apixaban   2.5 mg Oral BID   carvedilol   12.5 mg Oral BID WC   citalopram   10 mg Oral q AM   ferrous sulfate   324 mg Oral Q breakfast   finasteride   5 mg Oral Daily   fluticasone  furoate-vilanterol  1 puff Inhalation Daily   furosemide   80 mg Intravenous TID   guaiFENesin   1,200 mg Oral BID   insulin  aspart  0-9 Units Subcutaneous TID WC   pantoprazole   40 mg Oral BID AC   potassium chloride   20 mEq Oral Daily   rosuvastatin   20 mg Oral Daily   sodium chloride  flush  3 mL Intravenous Q12H   traZODone   100 mg Oral QHS   umeclidinium bromide   1 puff Inhalation Daily   Continuous Infusions:  PRN Meds: acetaminophen  **OR** acetaminophen , albuterol , bisacodyl , fluticasone , loratadine , ondansetron  **OR** ondansetron  (ZOFRAN ) IV, polyethylene glycol, sodium chloride  flush   Vital Signs    Vitals:   07/31/24 1555 07/31/24 1948 08/01/24 0005 08/01/24 0500  BP: 133/85 (!) 131/92 135/79 109/66  Pulse: (!) 59 72 92 86  Resp:  18 18 19   Temp: 97.7 F (36.5 C) 98.5 F (36.9 C) 98.3 F (36.8 C) 98.6 F (37 C)  TempSrc: Oral Oral Oral Oral  SpO2: 100% 100% 100% 92%  Weight:    99.9 kg  Height:        Intake/Output Summary (Last 24 hours) at 08/01/2024 0805 Last data filed at 08/01/2024 0509 Gross per 24 hour  Intake 480 ml  Output 1950 ml  Net -1470 ml   Filed Weights   07/30/24 1505 07/31/24 0420 08/01/24 0500  Weight: 100.8 kg 99.8 kg 99.9 kg    Telemetry    Atrial fib, rate controlled  - Personally Reviewed  ECG    NA - Personally Reviewed  Physical Exam   GEN: No acute distress.   Neck: No  JVD Cardiac: Irregular RR, no murmurs, rubs, or  gallops.  Respiratory: Clear  to auscultation bilaterally. GI: Soft, nontender, non-distended  MS:    Moderate let edema; No deformity. Neuro:  Nonfocal  Psych: Normal affect   Labs    Chemistry Recent Labs  Lab 07/30/24 1237 07/31/24 0319 08/01/24 0232  NA 137 140 138  K 4.4 4.0 3.6  CL 98 99 98  CO2 27 28 31   GLUCOSE 107* 161* 136*  BUN 47* 45* 43*  CREATININE 1.75* 1.67* 1.63*  CALCIUM  9.3 9.0 8.8*  PROT 6.0*  --  5.8*  ALBUMIN  3.0*  --  3.1*  AST 23  --  11*  ALT 17  --  15  ALKPHOS 55  --  52  BILITOT 0.7  --  0.8  GFRNONAA 38* 41* 42*  ANIONGAP 12 13 9      Hematology Recent Labs  Lab 07/30/24 1237 07/31/24 0319 08/01/24 0232  WBC 7.0 6.7 6.8  RBC 4.05* 3.76* 4.04*  HGB 12.3* 11.4* 12.3*  HCT 39.3 36.2* 38.8*  MCV 97.0 96.3 96.0  MCH 30.4 30.3 30.4  MCHC 31.3 31.5 31.7  RDW 14.7 14.6 14.6  PLT 183 180 186  Cardiac EnzymesNo results for input(s): TROPONINI in the last 168 hours. No results for input(s): TROPIPOC in the last 168 hours.   BNP Recent Labs  Lab 07/30/24 1237 08/01/24 0232  BNP 548.0* 588.3*     DDimer No results for input(s): DDIMER in the last 168 hours.   Radiology    DG Chest Portable 1 View Result Date: 07/30/2024 CLINICAL DATA:  Shortness of breath. EXAM: PORTABLE CHEST 1 VIEW COMPARISON:  June 30, 2024. FINDINGS: Stable cardiomegaly with mild central pulmonary vascular congestion. Minimal bibasilar subsegmental atelectasis or edema is noted. Bony thorax is unremarkable. IMPRESSION: Stable cardiomegaly with mild central pulmonary vascular congestion. Minimal bibasilar subsegmental atelectasis or edema. Electronically Signed   By: Lynwood Landy Raddle M.D.   On: 07/30/2024 13:21    Cardiac Studies   Echo:  1. Technically difficult study, even with the use of echo contrast. Afib  with mildly elevated rates as well limit interpretation LVEF appears to be  mildly reduced at 40%. . Left ventricular ejection fraction, by   estimation, is 40%. The left ventricle  has mildly decreased function. The left ventricle demonstrates regional  wall motion abnormalities (see scoring diagram/findings for description).  Left ventricular diastolic parameters are indeterminate.   2. RV not well visualized, grossly appears normal in size with low normal  function. . Right ventricular systolic function was not well visualized.  The right ventricular size is not well visualized.   3. Left atrial size was moderately dilated.   4. Right atrial size was moderately dilated.   5. The mitral valve is abnormal. Mild mitral valve regurgitation. No  evidence of mitral stenosis.   6. The tricuspid valve is abnormal.   7. The aortic valve was not well visualized. Aortic valve regurgitation  is not visualized. No aortic stenosis is present.   8. The inferior vena cava is normal in size with greater than 50%  respiratory variability, suggesting right atrial pressure of 3 mmHg.   Patient Profile     83 y.o. male with a past medical history significant for Padonda to heart failure with preserved ejection fraction, chronic atrial fibrillation, CAD, COPD on home 2 L of oxygen , CKD stage IIIb and other comorbidities who presents with weight gain, dyspnea and 10 pound increase in his weight. Called and it was recommended him come to the ED for further evaluation. Blood work was done and shows that he is acute on chronic CHF and persistent A-fib.   Assessment & Plan    Acute on chronic HFmrEF:    Intake and output net -2500 cc.  Chest x-ray still has mild edema.  Official reading pending.  I reviewed the images.  BNP still mildly elevated today.  Continue beta-blocker.  Continue current 80 mg 3 times daily Lasix .  Continue today. Of note I reviewed the echocardiogram.  Technically limited.  I went over with Dr. Alvan.  The EF likely is somewhat reduced to 40%.  There was somewhat more global hypokinesis.  Again endocardium was not  well-visualized.  Valvular studies were somewhat incomplete.  Thank you should have continued medical management and follow-up echo in the future after sinus rhythm has been restored.  Paroxysmal atrial fibrillation/flutter: Now in atrial fibrillation with cardioversion planned Monday.  Continue anticoagulation.  Coronary artery disease : Noted to previously have CTO of his stented RCA.  No ischemia workup planned.  Continue risk reduction.  Hyperlipidemia :   Continue meds as listed.   Diabetes :  Per primary team  CKD stage 3b:   Creatinine is 1.63.  This is stable compared to previous.   For questions or updates, please contact [facility] Please consult [email or web address] for contact info under Cardiology/STEMI.   Signed, Lynwood Schilling, MD  08/01/2024, 8:05 AM

## 2024-08-01 NOTE — Plan of Care (Signed)

## 2024-08-02 ENCOUNTER — Inpatient Hospital Stay (HOSPITAL_COMMUNITY)

## 2024-08-02 DIAGNOSIS — I482 Chronic atrial fibrillation, unspecified: Secondary | ICD-10-CM | POA: Diagnosis not present

## 2024-08-02 DIAGNOSIS — I5043 Acute on chronic combined systolic (congestive) and diastolic (congestive) heart failure: Secondary | ICD-10-CM | POA: Diagnosis not present

## 2024-08-02 DIAGNOSIS — R0602 Shortness of breath: Secondary | ICD-10-CM | POA: Diagnosis not present

## 2024-08-02 LAB — CBC WITH DIFFERENTIAL/PLATELET
Abs Immature Granulocytes: 0.02 K/uL (ref 0.00–0.07)
Basophils Absolute: 0 K/uL (ref 0.0–0.1)
Basophils Relative: 0 %
Eosinophils Absolute: 0.1 K/uL (ref 0.0–0.5)
Eosinophils Relative: 2 %
HCT: 39 % (ref 39.0–52.0)
Hemoglobin: 12.1 g/dL — ABNORMAL LOW (ref 13.0–17.0)
Immature Granulocytes: 0 %
Lymphocytes Relative: 15 %
Lymphs Abs: 1.1 K/uL (ref 0.7–4.0)
MCH: 30 pg (ref 26.0–34.0)
MCHC: 31 g/dL (ref 30.0–36.0)
MCV: 96.8 fL (ref 80.0–100.0)
Monocytes Absolute: 1 K/uL (ref 0.1–1.0)
Monocytes Relative: 13 %
Neutro Abs: 5.5 K/uL (ref 1.7–7.7)
Neutrophils Relative %: 70 %
Platelets: 188 K/uL (ref 150–400)
RBC: 4.03 MIL/uL — ABNORMAL LOW (ref 4.22–5.81)
RDW: 14.4 % (ref 11.5–15.5)
WBC: 7.7 K/uL (ref 4.0–10.5)
nRBC: 0 % (ref 0.0–0.2)

## 2024-08-02 LAB — COMPREHENSIVE METABOLIC PANEL WITH GFR
ALT: 22 U/L (ref 0–44)
AST: 19 U/L (ref 15–41)
Albumin: 3 g/dL — ABNORMAL LOW (ref 3.5–5.0)
Alkaline Phosphatase: 60 U/L (ref 38–126)
Anion gap: 10 (ref 5–15)
BUN: 43 mg/dL — ABNORMAL HIGH (ref 8–23)
CO2: 31 mmol/L (ref 22–32)
Calcium: 8.8 mg/dL — ABNORMAL LOW (ref 8.9–10.3)
Chloride: 97 mmol/L — ABNORMAL LOW (ref 98–111)
Creatinine, Ser: 1.71 mg/dL — ABNORMAL HIGH (ref 0.61–1.24)
GFR, Estimated: 39 mL/min — ABNORMAL LOW (ref >60)
Glucose, Bld: 144 mg/dL — ABNORMAL HIGH (ref 70–99)
Potassium: 4 mmol/L (ref 3.5–5.1)
Sodium: 138 mmol/L (ref 135–145)
Total Bilirubin: 1 mg/dL (ref 0.0–1.2)
Total Protein: 5.8 g/dL — ABNORMAL LOW (ref 6.5–8.1)

## 2024-08-02 LAB — GLUCOSE, CAPILLARY
Glucose-Capillary: 179 mg/dL — ABNORMAL HIGH (ref 70–99)
Glucose-Capillary: 370 mg/dL — ABNORMAL HIGH (ref 70–99)
Glucose-Capillary: 185 mg/dL — ABNORMAL HIGH (ref 70–99)
Glucose-Capillary: 198 mg/dL — ABNORMAL HIGH (ref 70–99)

## 2024-08-02 LAB — PHOSPHORUS: Phosphorus: 3.4 mg/dL (ref 2.5–4.6)

## 2024-08-02 LAB — MAGNESIUM: Magnesium: 2.2 mg/dL (ref 1.7–2.4)

## 2024-08-02 MED ORDER — POLYETHYLENE GLYCOL 3350 17 G PO PACK
17.0000 g | PACK | Freq: Two times a day (BID) | ORAL | Status: DC
  Administered 2024-08-03 – 2024-08-06 (×10): 17 g via ORAL
  Filled 2024-08-02 (×9): qty 1

## 2024-08-02 MED ORDER — BISACODYL 10 MG RE SUPP: 10.0000 mg | Freq: Every day | RECTAL | Status: DC | PRN

## 2024-08-02 MED ORDER — SENNOSIDES-DOCUSATE SODIUM 8.6-50 MG PO TABS
1.0000 | ORAL_TABLET | Freq: Two times a day (BID) | ORAL | Status: DC
  Administered 2024-08-02 – 2024-08-07 (×14): 1 via ORAL
  Filled 2024-08-02 (×10): qty 1

## 2024-08-02 NOTE — Progress Notes (Signed)
 Occupational Therapy Treatment Patient Details Name: [name]. MRN: [id number] DOB: 22-Oct-1941 Today's Date: 08/02/2024   History of present illness 84 y.o male admitted 07/30/24 for Afib and volume overload. PMhx: Admission 7/2 for AKI and Rt foot sprain.PMH: COPD on 2L O2, T2DM, CAD, PAF on Eliquis , CKD, CVA with left-sided weakness, chronic HFpEF, Lap-Band, gout, HLD, PVD, skin CA   OT comments  Pt. Seen for skilled OT tx session.  Pt. Able to complete b.room transfer with S and seated/standing grooming and ub/lb bathing at sink with S/MIN A.  Pt. With good adherence to breathing strategies and energy conservation during adls and mobilty.  Good pace during ambulation and able to manage o2 tubing without cues.  Cont. With acute OT POC.        If plan is discharge home, recommend the following:  A little help with walking and/or transfers;Assistance with cooking/housework   Equipment Recommendations  None recommended by OT    Recommendations for Other Services      Precautions / Restrictions Precautions Precautions: Fall Precaution/Restrictions Comments: Oxygen        Mobility Bed Mobility               General bed mobility comments: seated eob upon arrival and seated in recliner at end of session    Transfers                         Balance                                           ADL either performed or assessed with clinical judgement   ADL Overall ADL's : Needs assistance/impaired     Grooming: Wash/dry hands;Wash/dry face;Oral care;Applying deodorant;Brushing hair;Sitting;Set up Grooming Details (indicate cue type and reason): also shaving seated Upper Body Bathing: Set up;Sitting   Lower Body Bathing: Sit to/from stand;Minimal assistance Lower Body Bathing Details (indicate cue type and reason): ue support on counter of sink-assistance with buttocks for thoroughness Upper Body Dressing : Set up;Sitting       Toilet  Transfer: Supervision/safety;Rolling walker (2 wheels);Regular Toilet;Ambulation Toilet Transfer Details (indicate cue type and reason): S for safety with RW and O2 cords Toileting- Clothing Manipulation and Hygiene: Supervision/safety;Sit to/from stand Toileting - Clothing Manipulation Details (indicate cue type and reason): Perihygiene seated     Functional mobility during ADLs: Supervision/safety;Rolling walker (2 wheels) General ADL Comments: S for safety, cues for positioning with RW and problem solving    Extremity/Trunk Assessment              Vision       Perception     Praxis     Communication Communication Communication: No apparent difficulties   Cognition Arousal: Alert Behavior During Therapy: WFL for tasks assessed/performed Cognition: No apparent impairments             OT - Cognition Comments: Poor safety awareness and judgement likely baseline                 Following commands: Intact        Cueing   Cueing Techniques: Verbal cues, Gestural cues  Exercises      Shoulder Instructions       General Comments      Pertinent Vitals/ Pain       Pain Assessment Pain Assessment: No/denies pain  Home Living                                          Prior Functioning/Environment              Frequency  Min 2X/week        Progress Toward Goals  OT Goals(current goals can now be found in the care plan section)  Progress towards OT goals: Progressing toward goals     Plan      Co-evaluation                 AM-PAC OT 6 Clicks Daily Activity     Outcome Measure   Help from another person eating meals?: None Help from another person taking care of personal grooming?: A Little Help from another person toileting, which includes using toliet, bedpan, or urinal?: A Little Help from another person bathing (including washing, rinsing, drying)?: A Little Help from another person to put on and taking  off regular upper body clothing?: A Little Help from another person to put on and taking off regular lower body clothing?: A Little 6 Click Score: 19    End of Session Equipment Utilized During Treatment: Rolling walker (2 wheels);Oxygen   OT Visit Diagnosis: Unsteadiness on feet (R26.81);Other abnormalities of gait and mobility (R26.89);Muscle weakness (generalized) (M62.81)   Activity Tolerance Patient tolerated treatment well   Patient Left in chair;with call bell/phone within reach;with chair alarm set   Nurse Communication Other (comment) (rn present at end of session to give meds)        Time: 9096-9060 OT Time Calculation (min): 36 min  Charges: OT General Charges $OT Visit: 1 Visit OT Treatments $Self Care/Home Management : 23-37 mins  Randall, COTA/L Acute Rehabilitation [phone]   CHRISTELLA Nest Lorraine-COTA/L  08/02/2024, 9:39 AM

## 2024-08-02 NOTE — Progress Notes (Signed)
 Progress Note  Patient Name: [name]. Date of Encounter: 08/02/2024  Primary Cardiologist:   Lonni Cash, MD   Subjective   He is breathing better but not at baseline.  He had cramping when I tried to use compression stockings.   Inpatient Medications    Scheduled Meds:  allopurinol   300 mg Oral Daily   apixaban   2.5 mg Oral BID   carvedilol   12.5 mg Oral BID WC   citalopram   10 mg Oral q AM   ferrous sulfate   324 mg Oral Q breakfast   finasteride   5 mg Oral Daily   fluticasone  furoate-vilanterol  1 puff Inhalation Daily   furosemide   80 mg Intravenous TID   guaiFENesin   1,200 mg Oral BID   insulin  aspart  0-9 Units Subcutaneous TID WC   pantoprazole   40 mg Oral BID AC   potassium chloride   20 mEq Oral Daily   rosuvastatin   20 mg Oral Daily   sodium chloride  flush  3 mL Intravenous Q12H   traZODone   100 mg Oral QHS   umeclidinium bromide   1 puff Inhalation Daily   Continuous Infusions:  PRN Meds: acetaminophen  **OR** acetaminophen , albuterol , bisacodyl , fluticasone , loratadine , ondansetron  **OR** ondansetron  (ZOFRAN ) IV, polyethylene glycol, sodium chloride  flush   Vital Signs    Vitals:   08/01/24 2355 08/02/24 0424 08/02/24 0720 08/02/24 0812  BP: 134/81 (!) 132/90 116/60   Pulse: (!) 105 100 (!) 107 98  Resp: 19 20 18  (!) 21  Temp: 97.7 F (36.5 C) 97.6 F (36.4 C) 98.2 F (36.8 C)   TempSrc: Oral Oral Oral   SpO2: 100% 100% 98% 100%  Weight:  100.5 kg    Height:        Intake/Output Summary (Last 24 hours) at 08/02/2024 0827 Last data filed at 08/02/2024 0200 Gross per 24 hour  Intake 240 ml  Output 1610 ml  Net -1370 ml   Filed Weights   07/31/24 0420 08/01/24 0500 08/02/24 0424  Weight: 99.8 kg 99.9 kg 100.5 kg    Telemetry    Atrial fib with mildly increased ventricular rate  - Personally Reviewed  ECG    NA - Personally Reviewed  Physical Exam   GEN: No  acute distress.   Neck:     10 JVD Cardiac: Irregular RR,   murmurs, rubs, or gallops.  Respiratory:   Decreased breath sounds with basilar crackles GI: Soft, nontender, non-distended, normal bowel sounds  MS:  Moderate leg edema; No deformity. Neuro:   Nonfocal  Psych: Oriented and appropriate    Labs    Chemistry Recent Labs  Lab 07/30/24 1237 07/31/24 0319 08/01/24 0232 08/02/24 0224  NA 137 140 138 138  K 4.4 4.0 3.6 4.0  CL 98 99 98 97*  CO2 27 28 31 31   GLUCOSE 107* 161* 136* 144*  BUN 47* 45* 43* 43*  CREATININE 1.75* 1.67* 1.63* 1.71*  CALCIUM  9.3 9.0 8.8* 8.8*  PROT 6.0*  --  5.8* 5.8*  ALBUMIN  3.0*  --  3.1* 3.0*  AST 23  --  11* 19  ALT 17  --  15 22  ALKPHOS 55  --  52 60  BILITOT 0.7  --  0.8 1.0  GFRNONAA 38* 41* 42* 39*  ANIONGAP 12 13 9 10      Hematology Recent Labs  Lab 07/31/24 0319 08/01/24 0232 08/02/24 0224  WBC 6.7 6.8 7.7  RBC 3.76* 4.04* 4.03*  HGB 11.4* 12.3* 12.1*  HCT  36.2* 38.8* 39.0  MCV 96.3 96.0 96.8  MCH 30.3 30.4 30.0  MCHC 31.5 31.7 31.0  RDW 14.6 14.6 14.4  PLT 180 186 188    Cardiac EnzymesNo results for input(s): TROPONINI in the last 168 hours. No results for input(s): TROPIPOC in the last 168 hours.   BNP Recent Labs  Lab 07/30/24 1237 08/01/24 0232  BNP 548.0* 588.3*     DDimer No results for input(s): DDIMER in the last 168 hours.   Radiology    DG CHEST PORT 1 VIEW Result Date: 08/01/2024 CLINICAL DATA:  Shortness of breath. EXAM: PORTABLE CHEST 1 VIEW COMPARISON:  07/30/2024 FINDINGS: Cardiomegaly is stable. Increasing hazy opacity at the lung bases likely increasing pleural effusions. Worsening vascular congestion. No pneumothorax or confluent airspace disease. IMPRESSION: Cardiomegaly with worsening vascular congestion and increasing pleural effusions. Electronically Signed   By: Andrea Gasman M.D.   On: 08/01/2024 10:56    Cardiac Studies   Echo:  1. Technically difficult study, even with the use of echo contrast. Afib  with mildly elevated rates as  well limit interpretation LVEF appears to be  mildly reduced at 40%. . Left ventricular ejection fraction, by  estimation, is 40%. The left ventricle  has mildly decreased function. The left ventricle demonstrates regional  wall motion abnormalities (see scoring diagram/findings for description).  Left ventricular diastolic parameters are indeterminate.   2. RV not well visualized, grossly appears normal in size with low normal  function. . Right ventricular systolic function was not well visualized.  The right ventricular size is not well visualized.   3. Left atrial size was moderately dilated.   4. Right atrial size was moderately dilated.   5. The mitral valve is abnormal. Mild mitral valve regurgitation. No  evidence of mitral stenosis.   6. The tricuspid valve is abnormal.   7. The aortic valve was not well visualized. Aortic valve regurgitation  is not visualized. No aortic stenosis is present.   8. The inferior vena cava is normal in size with greater than 50%  respiratory variability, suggesting right atrial pressure of 3 mmHg.   Patient Profile     83 y.o. male with a past medical history significant for Padonda to heart failure with preserved ejection fraction, chronic atrial fibrillation, CAD, COPD on home 2 L of oxygen , CKD stage IIIb and other comorbidities who presents with weight gain, dyspnea and 10 pound increase in his weight. Called and it was recommended him come to the ED for further evaluation. Blood work was done and shows that he is acute on chronic CHF and persistent A-fib.   Assessment & Plan    Acute on chronic HFmrEF:    Intake and output net negative 4120 cc.  Chest x-ray yesterday with edema.  Continue current 80 mg 3 times daily Lasix .  Continue today. Of note I reviewed the echocardiogram.  Echo limited but EF appears to be lower this admission.  Plan repeat echo as an outpatient when he is in NSR.     Paroxysmal atrial fibrillation/flutter:    Plan DCCV on  Monday.   Coronary artery disease : Known CAD as previously noted.  Continue medical management  Hyperlipidemia :   Continue meds as listed.   Last LDL was 71.    Diabetes :  Per primary team      Most recent AIc 6.7.   CKD stage 3b:   Creatinine is 1.71 and mildly increased but lower than on admission.  Continue  current diuresis.     For questions or updates, please contact [facility] Please consult [email or web address] for contact info under Cardiology/STEMI.   Signed, Lynwood Schilling, MD  08/02/2024, 8:27 AM

## 2024-08-02 NOTE — Plan of Care (Signed)
   Problem: Education: Goal: Knowledge of General Education information will improve Description: Including pain rating scale, medication(s)/side effects and non-pharmacologic comfort measures Outcome: Progressing   Problem: Activity: Goal: Risk for activity intolerance will decrease Outcome: Progressing   Problem: Pain Managment: Goal: General experience of comfort will improve and/or be controlled Outcome: Progressing

## 2024-08-02 NOTE — Plan of Care (Signed)
   Problem: Education: Goal: Knowledge of General Education information will improve Description Including pain rating scale, medication(s)/side effects and non-pharmacologic comfort measures Outcome: Progressing

## 2024-08-02 NOTE — Progress Notes (Addendum)
 PROGRESS NOTE    [name].  FMW:[id number] DOB: 1941-05-08 DOA: 07/30/2024 PCP: [name] HERO, MD   Brief Narrative:  The patient is an 83 year old Caucasian male with a past medical history significant for Padonda to heart failure with preserved ejection fraction, chronic atrial fibrillation, CAD, COPD on home 2 L of oxygen , CKD stage IIIb and other comorbidities who presents with weight gain, dyspnea and 10 pound increase in his weight.  Called his cardiologist who recommended him come to the ED for further evaluation.  Blood work was done and shows that he is acute on chronic CHF and persistent A-fib.  Cardiology consulted and he is being diuresed as below and current plan is for DCCV on Monday, 08/03/2024.  Assessment and Plan:  Acute on Chronic HFpEF: Presents with progressively worsening volume overload and increased weight gain.  Blood pressure has been soft at home and was initiated on IV Lasix  and cardiology is increasing his Furosemide  to 80 mg 3 times daily starting today.  Defer reinitiation of Farxiga  to cardiology.  Home regimen included carvedilol  25 mg twice daily, Farxiga  10 mg daily, Imdur  60 mg once a day and torsemide  60 milligrams daily.  Repeating echo this visit and showed that the EF was reduced to 40% and there was somewhat more global hypokinesis and the endocardium was not well-visualized.  Per Cardiologist some of his ECHO studies were somewhat incomplete and they are recommending repeating ECHO in outpatient setting after sinus rhythm has been restored.  Need strict I's and O's and daily weights.  Intake/Output Summary (Last 24 hours) at 08/02/2024 1447 Last data filed at 08/02/2024 1100 Gross per 24 hour  Intake 240 ml  Output 1260 ml  Net -1020 ml  -His Imdur  had been held for more blood pressure support.  Cardiology planning on DCCV on Monday unless he spontaneously reverts to normal sinus rhythm over the weekend.  Persistent and chronic A-fib with RVR: Heart  rate in the low 100s in the ED-given metoprolol . Cautiously continuing Coreg  at 12.5 mg p.o. twice daily with holding parameters-but if blood pressure becomes an issue-may need to be switched to amiodarone.  Cardiology also discussed with him about the increased risk of bronchospasm with carvedilol  versus more selective beta-blockers.Continue anticoagulation with apixaban  2.5 mg p.o. twice daily given that his CHA2DS2-VASc score is 8.  Continue with Telemetry monitoring as Cardiology is planning for DCCV on Monday, 08/03/2024   Essential HTN: BP reportedly soft at home. Holding Imdur  for now. Cautiously continue with Coreg .  Cardiology is increase his Lasix  IV to 80 mg 3 times daily dosing.  Continue to monitor blood pressures per protocol.  Last blood pressure reading was 116/60  HLD: C/w Rosuvastatin  20 mg po Daily    CAD s/p PCI/CTO 2014.: Some shortness of breath secondary to CHF-but no chest pain/anginal symptoms.  Patient was admitted to have CTO LAD stented RCA suspect that the patient is not on aspirin  as patient on Eliquis . Continue statin/beta-blocker.  Further care per Cardiology.  Currently they are recommending no ischemia workup planned and recommended continue risk reduction   DM-2: Most recent HbA1c was 6.7. Continue sensitive NovoLog  sliding scale insulin  SSI. CBG Trend:  Recent Labs  Lab 08/01/24 0551 08/01/24 0805 08/01/24 1125 08/01/24 1549 08/01/24 2105 08/02/24 0553 08/02/24 1148  GLUCAP 140* 181* 375* 165* 152* 179* 370*  -Adjust Insulin  regimen as necessary and continue to monitor blood sugars per protocol   CKD stage IIIb: Close to baseline. BUN/Cr Trend: Recent  Labs  Lab 07/09/24 0907 07/15/24 1144 07/30/24 1237 07/31/24 0319 08/01/24 0232 08/02/24 0224  BUN 57* 57* 47* 45* 43* 43*  CREATININE 1.93* 1.71* 1.75* 1.67* 1.63* 1.71*  -Monitor UOP as he is being diuresed. Avoid Nephrotoxic Medications, Contrast Dyes, Hypotension and Dehydration to Ensure Adequate  Renal Perfusion and will need to Renally Adjust Meds. CTM and Trend Renal Function carefully and repeat CMP in the AM   BPH: Continue with Finasteride  5 mg po Daily   Normocytic Anemia: Hgb/Hct Trend:  Recent Labs  Lab 07/09/24 0907 07/30/24 1237 07/31/24 0319 08/01/24 0232 08/02/24 0224  HGB 12.3* 12.3* 11.4* 12.3* 12.1*  HCT 37.7* 39.3 36.2* 38.8* 39.0  MCV 92.5 97.0 96.3 96.0 96.8  -Check Anemia Panel in the AM. CTM for S/Sx of Bleeding; No overt bleeding noted. Repeat CBC in the AM   COPD with chronic hypoxic respiratory failure on home O2-2 L Continue bronchodilators with Breo Ellipta  1 puff IH daily and albuterol  neb 2.5 mg every 2 as needed for wheezing. Remains on oxygen -2 L at baseline. Will need Ambulatory O2 Screen prior to D/C  Constipation: Has not had a bowel movement in about 6 days despite taking as needed MiraLAX  and bisacodyl .  Will schedule MiraLAX  17 g p.o. twice daily and add senna docusate 1 tab p.o. twice daily and as well as provide as needed 10 mg bisacodyl  suppositories rectally   Debility/deconditioning: PT/OT eval recommending Home Health   GERD/GI Prophylaxis: C/w PPI w/ Pantoprazole  40 mg BID  Hypoalbuminemia: Patient's Albumin  Lvl is now 3.0 on last check. CTM and Trend and repeat CMP in the AM  Class II Obesity: Complicates overall prognosis and care. Estimated body mass index is 39.24 kg/m as calculated from the following:   Height as of this encounter: 5' 3 (1.6 m).   Weight as of this encounter: 100.5 kg. Weight Loss and Dietary Counseling given DVT prophylaxis: Place TED hose Start: 08/01/24 0939 apixaban  (ELIQUIS ) tablet 2.5 mg Start: 07/30/24 2200 apixaban  (ELIQUIS ) tablet 2.5 mg     Code Status: Full Code Family Communication: No family currently at bedside  Disposition Plan:  Level of care: Telemetry Cardiac Status is: Inpatient Remains inpatient appropriate because: His further clinical improvement and clearance by the specialist as  he is going to go for a DCCV tomorrow   Consultants:  Cardiology  Procedures:  ECHOCARDIOGRAM  Antimicrobials:  Anti-infectives (From admission, onward)    None       Subjective: Seen and examined at bedside and was doing okay but states that he still feels puffy and still feels about the same with his breathing.  Has not had a bowel movement in about 6 days now.  Denies any other concerns or complaints at this time.  Objective: Vitals:   08/01/24 2355 08/02/24 0424 08/02/24 0720 08/02/24 0812  BP: 134/81 (!) 132/90 116/60   Pulse: (!) 105 100 (!) 107 98  Resp: 19 20 18  (!) 21  Temp: 97.7 F (36.5 C) 97.6 F (36.4 C) 98.2 F (36.8 C)   TempSrc: Oral Oral Oral   SpO2: 100% 100% 98% 100%  Weight:  100.5 kg    Height:        Intake/Output Summary (Last 24 hours) at 08/02/2024 1450 Last data filed at 08/02/2024 1100 Gross per 24 hour  Intake 240 ml  Output 1260 ml  Net -1020 ml   Filed Weights   07/31/24 0420 08/01/24 0500 08/02/24 0424  Weight: 99.8 kg 99.9 kg 100.5 kg  Examination: Physical Exam:  Constitutional: Elderly obese Caucasian male in no acute distress sitting in the chair at bedside. Respiratory: Diminished to auscultation bilaterally, no wheezing, rales, rhonchi or crackles. Normal respiratory effort and patient is not tachypenic. No accessory muscle use.  Cardiovascular: Irregularly irregular MRI tachycardia low 100s, no murmurs / rubs / gallops. S1 and S2 auscultated.  1+ lower extremity pitting edema Abdomen: Soft, non-tender, distended secondary to body habitus.  Bowel sounds positive.  GU: Deferred. Musculoskeletal: No clubbing / cyanosis of digits/nails. No joint deformity upper and lower extremities.  Skin: No rashes, lesions on the limited skin evaluation, ulcers on limited skin evaluation. No induration; Warm and dry.  Neurologic: CN 2-12 grossly intact with no focal deficits. Romberg sign and cerebellar reflexes not assessed.  Psychiatric:  Normal judgment and insight. Alert and oriented x 3. Normal mood and appropriate affect.   Data Reviewed: I have personally reviewed following labs and imaging studies  CBC: Recent Labs  Lab 07/30/24 1237 07/31/24 0319 08/01/24 0232 08/02/24 0224  WBC 7.0 6.7 6.8 7.7  NEUTROABS  --   --  4.7 5.5  HGB 12.3* 11.4* 12.3* 12.1*  HCT 39.3 36.2* 38.8* 39.0  MCV 97.0 96.3 96.0 96.8  PLT 183 180 186 188   Basic Metabolic Panel: Recent Labs  Lab 07/30/24 1237 07/31/24 0319 08/01/24 0232 08/02/24 0224  NA 137 140 138 138  K 4.4 4.0 3.6 4.0  CL 98 99 98 97*  CO2 27 28 31 31   GLUCOSE 107* 161* 136* 144*  BUN 47* 45* 43* 43*  CREATININE 1.75* 1.67* 1.63* 1.71*  CALCIUM  9.3 9.0 8.8* 8.8*  MG 2.3  --  2.3 2.2  PHOS  --   --  3.5 3.4   GFR: Estimated Creatinine Clearance: 35 mL/min (A) (by C-G formula based on SCr of 1.71 mg/dL (H)). Liver Function Tests: Recent Labs  Lab 07/30/24 1237 08/01/24 0232 08/02/24 0224  AST 23 11* 19  ALT 17 15 22   ALKPHOS 55 52 60  BILITOT 0.7 0.8 1.0  PROT 6.0* 5.8* 5.8*  ALBUMIN  3.0* 3.1* 3.0*   No results for input(s): LIPASE, AMYLASE in the last 168 hours. No results for input(s): AMMONIA in the last 168 hours. Coagulation Profile: No results for input(s): INR, PROTIME in the last 168 hours. Cardiac Enzymes: No results for input(s): CKTOTAL, CKMB, CKMBINDEX, TROPONINI in the last 168 hours. BNP (last 3 results) Recent Labs    06/18/24 0913  PROBNP 151.0*   HbA1C: No results for input(s): HGBA1C in the last 72 hours. CBG: Recent Labs  Lab 08/01/24 1125 08/01/24 1549 08/01/24 2105 08/02/24 0553 08/02/24 1148  GLUCAP 375* 165* 152* 179* 370*   Lipid Profile: No results for input(s): CHOL, HDL, LDLCALC, TRIG, CHOLHDL, LDLDIRECT in the last 72 hours. Thyroid  Function Tests: No results for input(s): TSH, T4TOTAL, FREET4, T3FREE, THYROIDAB in the last 72 hours. Anemia Panel: No results  for input(s): VITAMINB12, FOLATE, FERRITIN, TIBC, IRON , RETICCTPCT in the last 72 hours. Sepsis Labs: No results for input(s): PROCALCITON, LATICACIDVEN in the last 168 hours.  No results found for this or any previous visit (from the past 240 hours).   Radiology Studies: DG CHEST PORT 1 VIEW Result Date: 08/02/2024 CLINICAL DATA:  Shortness of breath. EXAM: PORTABLE CHEST 1 VIEW COMPARISON:  08/01/2024 FINDINGS: The cardio pericardial silhouette is enlarged. Vascular congestion again noted without overt airspace pulmonary edema. There is some atelectasis or infiltrate in both lung bases, similar to prior with probable  small bilateral pleural effusions. Telemetry leads overlie the chest. IMPRESSION: 1. Enlargement of the cardiopericardial silhouette with vascular congestion. 2. Bibasilar atelectasis or infiltrate with probable small bilateral pleural effusions. Electronically Signed   By: Camellia Candle M.D.   On: 08/02/2024 08:58   DG CHEST PORT 1 VIEW Result Date: 08/01/2024 CLINICAL DATA:  Shortness of breath. EXAM: PORTABLE CHEST 1 VIEW COMPARISON:  07/30/2024 FINDINGS: Cardiomegaly is stable. Increasing hazy opacity at the lung bases likely increasing pleural effusions. Worsening vascular congestion. No pneumothorax or confluent airspace disease. IMPRESSION: Cardiomegaly with worsening vascular congestion and increasing pleural effusions. Electronically Signed   By: Andrea Gasman M.D.   On: 08/01/2024 10:56   Scheduled Meds:  allopurinol   300 mg Oral Daily   apixaban   2.5 mg Oral BID   carvedilol   12.5 mg Oral BID WC   citalopram   10 mg Oral q AM   ferrous sulfate   324 mg Oral Q breakfast   finasteride   5 mg Oral Daily   fluticasone  furoate-vilanterol  1 puff Inhalation Daily   furosemide   80 mg Intravenous TID   guaiFENesin   1,200 mg Oral BID   insulin  aspart  0-9 Units Subcutaneous TID WC   pantoprazole   40 mg Oral BID AC   polyethylene glycol  17 g Oral BID    potassium chloride   20 mEq Oral Daily   rosuvastatin   20 mg Oral Daily   senna-docusate  1 tablet Oral BID   sodium chloride  flush  3 mL Intravenous Q12H   traZODone   100 mg Oral QHS   umeclidinium bromide   1 puff Inhalation Daily   Continuous Infusions:   LOS: 3 days   Alejandro Marker, DO [facility] Available via Epic secure chat 7am-7pm After these hours, please refer to coverage provider listed on [email or web address] 08/02/2024, 2:50 PM

## 2024-08-03 ENCOUNTER — Encounter (HOSPITAL_COMMUNITY)
Admission: EM | Disposition: A | Discharge status: Home Health | Payer: Self-pay | Source: Ambulatory Visit | Attending: Internal Medicine

## 2024-08-03 ENCOUNTER — Inpatient Hospital Stay (HOSPITAL_COMMUNITY): Admitting: Anesthesiology

## 2024-08-03 ENCOUNTER — Encounter (HOSPITAL_COMMUNITY): Payer: Self-pay | Admitting: Internal Medicine

## 2024-08-03 ENCOUNTER — Ambulatory Visit (HOSPITAL_COMMUNITY): Admission: RE | Admit: 2024-08-03 | Source: Home / Self Care | Admitting: Cardiology

## 2024-08-03 DIAGNOSIS — I4891 Unspecified atrial fibrillation: Secondary | ICD-10-CM

## 2024-08-03 DIAGNOSIS — I5033 Acute on chronic diastolic (congestive) heart failure: Secondary | ICD-10-CM | POA: Diagnosis not present

## 2024-08-03 DIAGNOSIS — I482 Chronic atrial fibrillation, unspecified: Secondary | ICD-10-CM | POA: Diagnosis not present

## 2024-08-03 DIAGNOSIS — I2510 Atherosclerotic heart disease of native coronary artery without angina pectoris: Secondary | ICD-10-CM | POA: Diagnosis not present

## 2024-08-03 DIAGNOSIS — I110 Hypertensive heart disease with heart failure: Secondary | ICD-10-CM | POA: Diagnosis not present

## 2024-08-03 DIAGNOSIS — R0602 Shortness of breath: Secondary | ICD-10-CM | POA: Diagnosis not present

## 2024-08-03 DIAGNOSIS — I5043 Acute on chronic combined systolic (congestive) and diastolic (congestive) heart failure: Secondary | ICD-10-CM | POA: Diagnosis not present

## 2024-08-03 DIAGNOSIS — I5023 Acute on chronic systolic (congestive) heart failure: Secondary | ICD-10-CM

## 2024-08-03 HISTORY — PX: CARDIOVERSION: EP1203

## 2024-08-03 LAB — CBC WITH DIFFERENTIAL/PLATELET
Abs Immature Granulocytes: 0.02 K/uL (ref 0.00–0.07)
Basophils Absolute: 0 K/uL (ref 0.0–0.1)
Basophils Relative: 0 %
Eosinophils Absolute: 0.1 K/uL (ref 0.0–0.5)
Eosinophils Relative: 1 %
HCT: 40.1 % (ref 39.0–52.0)
Hemoglobin: 12.3 g/dL — ABNORMAL LOW (ref 13.0–17.0)
Immature Granulocytes: 0 %
Lymphocytes Relative: 11 %
Lymphs Abs: 0.7 K/uL (ref 0.7–4.0)
MCH: 29.7 pg (ref 26.0–34.0)
MCHC: 30.7 g/dL (ref 30.0–36.0)
MCV: 96.9 fL (ref 80.0–100.0)
Monocytes Absolute: 0.8 K/uL (ref 0.1–1.0)
Monocytes Relative: 12 %
Neutro Abs: 5 K/uL (ref 1.7–7.7)
Neutrophils Relative %: 76 %
Platelets: 169 K/uL (ref 150–400)
RBC: 4.14 MIL/uL — ABNORMAL LOW (ref 4.22–5.81)
RDW: 14.5 % (ref 11.5–15.5)
WBC: 6.5 K/uL (ref 4.0–10.5)
nRBC: 0 % (ref 0.0–0.2)

## 2024-08-03 LAB — GLUCOSE, CAPILLARY
Glucose-Capillary: 211 mg/dL — ABNORMAL HIGH (ref 70–99)
Glucose-Capillary: 196 mg/dL — ABNORMAL HIGH (ref 70–99)
Glucose-Capillary: 198 mg/dL — ABNORMAL HIGH (ref 70–99)
Glucose-Capillary: 251 mg/dL — ABNORMAL HIGH (ref 70–99)
Glucose-Capillary: 198 mg/dL — ABNORMAL HIGH (ref 70–99)

## 2024-08-03 LAB — COMPREHENSIVE METABOLIC PANEL WITH GFR
ALT: 21 U/L (ref 0–44)
AST: 14 U/L — ABNORMAL LOW (ref 15–41)
Albumin: 3.1 g/dL — ABNORMAL LOW (ref 3.5–5.0)
Alkaline Phosphatase: 56 U/L (ref 38–126)
Anion gap: 10 (ref 5–15)
BUN: 42 mg/dL — ABNORMAL HIGH (ref 8–23)
CO2: 29 mmol/L (ref 22–32)
Calcium: 8.7 mg/dL — ABNORMAL LOW (ref 8.9–10.3)
Chloride: 97 mmol/L — ABNORMAL LOW (ref 98–111)
Creatinine, Ser: 1.86 mg/dL — ABNORMAL HIGH (ref 0.61–1.24)
GFR, Estimated: 36 mL/min — ABNORMAL LOW (ref >60)
Glucose, Bld: 211 mg/dL — ABNORMAL HIGH (ref 70–99)
Potassium: 4.7 mmol/L (ref 3.5–5.1)
Sodium: 136 mmol/L (ref 135–145)
Total Bilirubin: 0.5 mg/dL (ref 0.0–1.2)
Total Protein: 6 g/dL — ABNORMAL LOW (ref 6.5–8.1)

## 2024-08-03 LAB — PHOSPHORUS: Phosphorus: 3.4 mg/dL (ref 2.5–4.6)

## 2024-08-03 LAB — MAGNESIUM: Magnesium: 2.3 mg/dL (ref 1.7–2.4)

## 2024-08-03 SURGERY — CARDIOVERSION (CATH LAB)
Anesthesia: General

## 2024-08-03 MED ORDER — PROPOFOL 10 MG/ML IV BOLUS
INTRAVENOUS | Status: DC | PRN
  Administered 2024-08-03 (×2): 30 mg via INTRAVENOUS

## 2024-08-03 MED ORDER — PHENYLEPHRINE HCL (PRESSORS) 10 MG/ML IV SOLN
INTRAVENOUS | Status: DC | PRN
  Administered 2024-08-03 (×2): 80 ug via INTRAVENOUS

## 2024-08-03 MED ORDER — LIDOCAINE 2% (20 MG/ML) 5 ML SYRINGE
INTRAMUSCULAR | Status: DC | PRN
  Administered 2024-08-03 (×2): 50 mg via INTRAVENOUS

## 2024-08-03 MED ORDER — SODIUM CHLORIDE 0.9 % IV SOLN: INTRAVENOUS | Status: DC

## 2024-08-03 MED ORDER — LORAZEPAM 2 MG/ML IJ SOLN
1.0000 mg | Freq: Once | INTRAMUSCULAR | Status: AC
  Administered 2024-08-03 (×2): 1 mg via INTRAVENOUS
  Filled 2024-08-03: qty 1

## 2024-08-03 SURGICAL SUPPLY — 1 items: PAD DEFIB RADIO PHYSIO CONN (PAD) ×1 IMPLANT

## 2024-08-03 NOTE — Progress Notes (Signed)
 Mobility Specialist Progress Note:   08/03/24 1030  Mobility  Activity Ambulated with assistance  Level of Assistance Contact guard assist, steadying assist  Assistive Device Front wheel walker  Distance Ambulated (ft) 60 ft  Activity Response Tolerated well  Mobility Referral Yes  Mobility visit 1 Mobility  Mobility Specialist Start Time (ACUTE ONLY) 1030  Mobility Specialist Stop Time (ACUTE ONLY) 1045  Mobility Specialist Time Calculation (min) (ACUTE ONLY) 15 min   Pt agreeable to mobility session. Required minG assist to ambulate with RW. No overt LOB noted. Pt SOB with exertion, SpO2 96% on 3LO2. HR 110s throughout. Pt left sitting EOB with all needs met, RN in room.   Therisa Rana Mobility Specialist Please contact via SecureChat or  Rehab office at [phone]

## 2024-08-03 NOTE — Anesthesia Preprocedure Evaluation (Addendum)
 Anesthesia Evaluation  Patient identified by MRN, date of birth, ID band  Reviewed: Allergy & Precautions, H&P , NPO status , Patient's Chart, lab work & pertinent test results, reviewed documented beta blocker date and time   Airway Mallampati: III  TM Distance: >3 FB Neck ROM: Full    Dental  (+) Dental Advisory Given   Pulmonary COPD,  COPD inhaler and oxygen  dependent, former smoker    + decreased breath sounds+ wheezing      Cardiovascular hypertension, Pt. on home beta blockers and Pt. on medications + CAD, + Past MI, + Peripheral Vascular Disease and +CHF   Rhythm:Irregular Rate:Tachycardia  Echo 07/2024  1. Technically difficult study, even with the use of echo contrast. Afib with mildly elevated rates as well limit interpretation LVEF appears to be mildly reduced at 40%.  Left ventricular ejection fraction, by estimation, is 40%. The left ventricle has mildly decreased function. The left ventricle demonstrates regional wall motion abnormalities (see scoring diagram/findings for description). Left ventricular diastolic parameters are indeterminate.   2. RV not well visualized, grossly appears normal in size with low normal function. Right ventricular systolic function was not well visualized. The right ventricular size is not well visualized.   3. Left atrial size was moderately dilated.   4. Right atrial size was moderately dilated.   5. The mitral valve is abnormal. Mild mitral valve regurgitation. No evidence of mitral stenosis.   6. The tricuspid valve is abnormal.   7. The aortic valve was not well visualized. Aortic valve regurgitation is not visualized. No aortic stenosis is present.   8. The inferior vena cava is normal in size with greater than 50% respiratory variability, suggesting right atrial pressure of 3 mmHg.    Cath 10/2021   Ost RCA to Mid RCA lesion is 100% stenosed.   Prox Cx lesion is 30% stenosed.   Ramus  lesion is 40% stenosed.   Mid LAD lesion is 35% stenosed.   LV end diastolic pressure is normal.   The left ventricular ejection fraction is 50-55% by visual estimate.   1.  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 2.  Mild to moderate disease of the left system 3.  LVEDP of around 12 mmHg depending on R to R interval. 4.  Future cardiac catheterizations will require a femoral approach due to lack of radial pulses; copious scar tissue is present in the right groin.     Neuro/Psych  PSYCHIATRIC DISORDERS  Depression    CVA    GI/Hepatic ,GERD  Controlled and Medicated,,  Endo/Other  diabetes, Type 2  Class 3 obesity  Renal/GU Renal InsufficiencyRenal disease     Musculoskeletal  (+) Arthritis ,    Abdominal   Peds  Hematology  (+) Blood dyscrasia, anemia   Anesthesia Other Findings   Reproductive/Obstetrics                              Anesthesia Physical Anesthesia Plan  ASA: 4  Anesthesia Plan: General   Post-op Pain Management: Minimal or no pain anticipated   Induction: Intravenous  PONV Risk Score and Plan: 1 and Propofol  infusion and Treatment may vary due to age or medical condition  Airway Management Planned: Nasal Cannula  Additional Equipment:   Intra-op Plan:   Post-operative Plan:   Informed Consent: I have reviewed the patients History and Physical, chart, labs and discussed  the procedure including the risks, benefits and alternatives for the proposed anesthesia with the patient or authorized representative who has indicated his/her understanding and acceptance.     Consent reviewed with POA and Dental advisory given  Plan Discussed with: CRNA  Anesthesia Plan Comments: (Spoke with Rudell, patient's wife, because pt had received ativan  this morning and is quite sleepy. He answer questions appropriately, but drifts back to sleep.)          Anesthesia Quick Evaluation

## 2024-08-03 NOTE — Interval H&P Note (Signed)
 History and Physical Interval Note:  08/03/2024 10:47 AM  [name].  has presented today for surgery, with the diagnosis of AFIB.  The various methods of treatment have been discussed with the patient and family. After consideration of risks, benefits and other options for treatment, the patient has consented to  Procedure(s): CARDIOVERSION (N/A) as a surgical intervention.  The patient's history has been reviewed, patient examined, no change in status, stable for surgery.  I have reviewed the patient's chart and labs.  Questions were answered to the patient's satisfaction.     [name]

## 2024-08-03 NOTE — Inpatient Diabetes Management (Signed)
 Inpatient Diabetes Program Recommendations  AACE/ADA: New Consensus Statement on Inpatient Glycemic Control (2015)  Target Ranges:  Prepandial:   less than 140 mg/dL      Peak postprandial:   less than 180 mg/dL (1-2 hours)      Critically ill patients:  140 - 180 mg/dL   Lab Results  Component Value Date   GLUCAP 211 (H) 08/03/2024   HGBA1C 6.7 (H) 06/18/2024    Review of Glycemic Control  Latest Reference Range & Units 08/01/24 15:49 08/01/24 21:05 08/02/24 05:53 08/02/24 11:48 08/02/24 16:18 08/02/24 21:11  Glucose-Capillary 70 - 99 mg/dL 834 (H) 847 (H) 820 (H) 370 (H) 185 (H) 198 (H)  (H): Data is abnormally high Diabetes history: Type 2 DM Outpatient Diabetes medications: Novolog  7-9 units TID, Farxiga  10 mg every day, Trulicity  4.5 mg qwk Current orders for Inpatient glycemic control: Novolog  0-9 units TID  Inpatient Diabetes Program Recommendations:    Consider adding Semglee  8 units every day  Thanks, Tinnie Minus, MSN, RNC-OB Diabetes Coordinator [phone] (8a-5p)

## 2024-08-03 NOTE — H&P (View-Only) (Signed)
 Progress Note  Patient Name: [name]. Date of Encounter: 08/03/2024  Primary Cardiologist:   Lonni Cash, MD   Subjective   He continues to breath slightly better.  No pain.   Inpatient Medications    Scheduled Meds:  allopurinol   300 mg Oral Daily   apixaban   2.5 mg Oral BID   carvedilol   12.5 mg Oral BID WC   citalopram   10 mg Oral q AM   ferrous sulfate   324 mg Oral Q breakfast   finasteride   5 mg Oral Daily   fluticasone  furoate-vilanterol  1 puff Inhalation Daily   furosemide   80 mg Intravenous TID   guaiFENesin   1,200 mg Oral BID   insulin  aspart  0-9 Units Subcutaneous TID WC   pantoprazole   40 mg Oral BID AC   polyethylene glycol  17 g Oral BID   potassium chloride   20 mEq Oral Daily   rosuvastatin   20 mg Oral Daily   senna-docusate  1 tablet Oral BID   sodium chloride  flush  3 mL Intravenous Q12H   traZODone   100 mg Oral QHS   umeclidinium bromide   1 puff Inhalation Daily   Continuous Infusions:  sodium chloride  20 mL/hr at 08/03/24 0418   PRN Meds: acetaminophen  **OR** acetaminophen , albuterol , bisacodyl , fluticasone , loratadine , ondansetron  **OR** ondansetron  (ZOFRAN ) IV, sodium chloride  flush   Vital Signs    Vitals:   08/02/24 1621 08/02/24 1932 08/03/24 0058 08/03/24 0635  BP:  120/82 124/84 (!) 122/93  Pulse: (!) 103 74 97   Resp: 18 18 18 18   Temp: 98.6 F (37 C) 97.8 F (36.6 C) 97.8 F (36.6 C) 97.8 F (36.6 C)  TempSrc: Oral Oral Oral Oral  SpO2:  100% 98% 100%  Weight:    101.2 kg  Height:        Intake/Output Summary (Last 24 hours) at 08/03/2024 0743 Last data filed at 08/03/2024 0500 Gross per 24 hour  Intake 558.55 ml  Output 2200 ml  Net -1641.45 ml   Filed Weights   08/01/24 0500 08/02/24 0424 08/03/24 0635  Weight: 99.9 kg 100.5 kg 101.2 kg    Telemetry    NA  - Personally Reviewed  ECG     - Personally Reviewed  Physical Exam   GEN: No  acute distress.   Neck: No  JVD Cardiac: Irregular  RR, no murmurs, rubs, or gallops.  Respiratory:     Decreased breath sounds with basilar crackles.  GI: Soft, nontender, non-distended, normal bowel sounds  MS:  Moderate to severe leg edema; No deformity. Neuro:   Nonfocal  Psych: Oriented and appropriate    Labs    Chemistry Recent Labs  Lab 07/30/24 1237 07/31/24 0319 08/01/24 0232 08/02/24 0224  NA 137 140 138 138  K 4.4 4.0 3.6 4.0  CL 98 99 98 97*  CO2 27 28 31 31   GLUCOSE 107* 161* 136* 144*  BUN 47* 45* 43* 43*  CREATININE 1.75* 1.67* 1.63* 1.71*  CALCIUM  9.3 9.0 8.8* 8.8*  PROT 6.0*  --  5.8* 5.8*  ALBUMIN  3.0*  --  3.1* 3.0*  AST 23  --  11* 19  ALT 17  --  15 22  ALKPHOS 55  --  52 60  BILITOT 0.7  --  0.8 1.0  GFRNONAA 38* 41* 42* 39*  ANIONGAP 12 13 9 10      Hematology Recent Labs  Lab 07/31/24 0319 08/01/24 0232 08/02/24 0224  WBC 6.7 6.8 7.7  RBC 3.76* 4.04* 4.03*  HGB 11.4* 12.3* 12.1*  HCT 36.2* 38.8* 39.0  MCV 96.3 96.0 96.8  MCH 30.3 30.4 30.0  MCHC 31.5 31.7 31.0  RDW 14.6 14.6 14.4  PLT 180 186 188    Cardiac EnzymesNo results for input(s): TROPONINI in the last 168 hours. No results for input(s): TROPIPOC in the last 168 hours.   BNP Recent Labs  Lab 07/30/24 1237 08/01/24 0232  BNP 548.0* 588.3*     DDimer No results for input(s): DDIMER in the last 168 hours.   Radiology    DG CHEST PORT 1 VIEW Result Date: 08/02/2024 CLINICAL DATA:  Shortness of breath. EXAM: PORTABLE CHEST 1 VIEW COMPARISON:  08/01/2024 FINDINGS: The cardio pericardial silhouette is enlarged. Vascular congestion again noted without overt airspace pulmonary edema. There is some atelectasis or infiltrate in both lung bases, similar to prior with probable small bilateral pleural effusions. Telemetry leads overlie the chest. IMPRESSION: 1. Enlargement of the cardiopericardial silhouette with vascular congestion. 2. Bibasilar atelectasis or infiltrate with probable small bilateral pleural effusions.  Electronically Signed   By: Camellia Candle M.D.   On: 08/02/2024 08:58    Cardiac Studies   Echo:  1. Technically difficult study, even with the use of echo contrast. Afib  with mildly elevated rates as well limit interpretation LVEF appears to be  mildly reduced at 40%. . Left ventricular ejection fraction, by  estimation, is 40%. The left ventricle  has mildly decreased function. The left ventricle demonstrates regional  wall motion abnormalities (see scoring diagram/findings for description).  Left ventricular diastolic parameters are indeterminate.   2. RV not well visualized, grossly appears normal in size with low normal  function. . Right ventricular systolic function was not well visualized.  The right ventricular size is not well visualized.   3. Left atrial size was moderately dilated.   4. Right atrial size was moderately dilated.   5. The mitral valve is abnormal. Mild mitral valve regurgitation. No  evidence of mitral stenosis.   6. The tricuspid valve is abnormal.   7. The aortic valve was not well visualized. Aortic valve regurgitation  is not visualized. No aortic stenosis is present.   8. The inferior vena cava is normal in size with greater than 50%  respiratory variability, suggesting right atrial pressure of 3 mmHg.   Patient Profile     83 y.o. male with a past medical history significant for [place] to heart failure with preserved ejection fraction, chronic atrial fibrillation, CAD, COPD on home 2 L of oxygen , CKD stage IIIb and other comorbidities who presents with weight gain, dyspnea and 10 pound increase in his weight. Called and it was recommended him come to the ED for further evaluation. Blood work was done and shows that he is acute on chronic CHF and persistent A-fib.   Assessment & Plan    Acute on chronic HFmrEF:    Intake and output net negative 5761 cc.  Creat is stable.  Continue IV diuresis    Paroxysmal atrial fibrillation/flutter:    Plan DCCV  today.    Coronary artery disease : Known CAD as previously noted.  Continue medical management  Hyperlipidemia :   Continue Crestor .   Last LDL was 71.    Diabetes :  Per primary team      Most recent AIc 6.7.   CKD stage 3b:   Creatinine is unchanged today and tolerating diuresis.  Continue current therapy.     For questions  or updates, please contact [facility] Please consult [email or web address] for contact info under Cardiology/STEMI.   Signed, Lynwood Schilling, MD  08/03/2024, 7:43 AM
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
## 2024-08-03 NOTE — Progress Notes (Signed)
 Progress Note  Patient Name: [name]. Date of Encounter: 08/03/2024  Primary Cardiologist:   Lonni Cash, MD   Subjective   He continues to breath slightly better.  No pain.   Inpatient Medications    Scheduled Meds:  allopurinol   300 mg Oral Daily   apixaban   2.5 mg Oral BID   carvedilol   12.5 mg Oral BID WC   citalopram   10 mg Oral q AM   ferrous sulfate   324 mg Oral Q breakfast   finasteride   5 mg Oral Daily   fluticasone  furoate-vilanterol  1 puff Inhalation Daily   furosemide   80 mg Intravenous TID   guaiFENesin   1,200 mg Oral BID   insulin  aspart  0-9 Units Subcutaneous TID WC   pantoprazole   40 mg Oral BID AC   polyethylene glycol  17 g Oral BID   potassium chloride   20 mEq Oral Daily   rosuvastatin   20 mg Oral Daily   senna-docusate  1 tablet Oral BID   sodium chloride  flush  3 mL Intravenous Q12H   traZODone   100 mg Oral QHS   umeclidinium bromide   1 puff Inhalation Daily   Continuous Infusions:  sodium chloride  20 mL/hr at 08/03/24 0418   PRN Meds: acetaminophen  **OR** acetaminophen , albuterol , bisacodyl , fluticasone , loratadine , ondansetron  **OR** ondansetron  (ZOFRAN ) IV, sodium chloride  flush   Vital Signs    Vitals:   08/02/24 1621 08/02/24 1932 08/03/24 0058 08/03/24 0635  BP:  120/82 124/84 (!) 122/93  Pulse: (!) 103 74 97   Resp: 18 18 18 18   Temp: 98.6 F (37 C) 97.8 F (36.6 C) 97.8 F (36.6 C) 97.8 F (36.6 C)  TempSrc: Oral Oral Oral Oral  SpO2:  100% 98% 100%  Weight:    101.2 kg  Height:        Intake/Output Summary (Last 24 hours) at 08/03/2024 0743 Last data filed at 08/03/2024 0500 Gross per 24 hour  Intake 558.55 ml  Output 2200 ml  Net -1641.45 ml   Filed Weights   08/01/24 0500 08/02/24 0424 08/03/24 0635  Weight: 99.9 kg 100.5 kg 101.2 kg    Telemetry    NA  - Personally Reviewed  ECG     - Personally Reviewed  Physical Exam   GEN: No  acute distress.   Neck: No  JVD Cardiac: Irregular  RR, no murmurs, rubs, or gallops.  Respiratory:     Decreased breath sounds with basilar crackles.  GI: Soft, nontender, non-distended, normal bowel sounds  MS:  Moderate to severe leg edema; No deformity. Neuro:   Nonfocal  Psych: Oriented and appropriate    Labs    Chemistry Recent Labs  Lab 07/30/24 1237 07/31/24 0319 08/01/24 0232 08/02/24 0224  NA 137 140 138 138  K 4.4 4.0 3.6 4.0  CL 98 99 98 97*  CO2 27 28 31 31   GLUCOSE 107* 161* 136* 144*  BUN 47* 45* 43* 43*  CREATININE 1.75* 1.67* 1.63* 1.71*  CALCIUM  9.3 9.0 8.8* 8.8*  PROT 6.0*  --  5.8* 5.8*  ALBUMIN  3.0*  --  3.1* 3.0*  AST 23  --  11* 19  ALT 17  --  15 22  ALKPHOS 55  --  52 60  BILITOT 0.7  --  0.8 1.0  GFRNONAA 38* 41* 42* 39*  ANIONGAP 12 13 9 10      Hematology Recent Labs  Lab 07/31/24 0319 08/01/24 0232 08/02/24 0224  WBC 6.7 6.8 7.7  RBC 3.76* 4.04* 4.03*  HGB 11.4* 12.3* 12.1*  HCT 36.2* 38.8* 39.0  MCV 96.3 96.0 96.8  MCH 30.3 30.4 30.0  MCHC 31.5 31.7 31.0  RDW 14.6 14.6 14.4  PLT 180 186 188    Cardiac EnzymesNo results for input(s): TROPONINI in the last 168 hours. No results for input(s): TROPIPOC in the last 168 hours.   BNP Recent Labs  Lab 07/30/24 1237 08/01/24 0232  BNP 548.0* 588.3*     DDimer No results for input(s): DDIMER in the last 168 hours.   Radiology    DG CHEST PORT 1 VIEW Result Date: 08/02/2024 CLINICAL DATA:  Shortness of breath. EXAM: PORTABLE CHEST 1 VIEW COMPARISON:  08/01/2024 FINDINGS: The cardio pericardial silhouette is enlarged. Vascular congestion again noted without overt airspace pulmonary edema. There is some atelectasis or infiltrate in both lung bases, similar to prior with probable small bilateral pleural effusions. Telemetry leads overlie the chest. IMPRESSION: 1. Enlargement of the cardiopericardial silhouette with vascular congestion. 2. Bibasilar atelectasis or infiltrate with probable small bilateral pleural effusions.  Electronically Signed   By: Camellia Candle M.D.   On: 08/02/2024 08:58    Cardiac Studies   Echo:  1. Technically difficult study, even with the use of echo contrast. Afib  with mildly elevated rates as well limit interpretation LVEF appears to be  mildly reduced at 40%. . Left ventricular ejection fraction, by  estimation, is 40%. The left ventricle  has mildly decreased function. The left ventricle demonstrates regional  wall motion abnormalities (see scoring diagram/findings for description).  Left ventricular diastolic parameters are indeterminate.   2. RV not well visualized, grossly appears normal in size with low normal  function. . Right ventricular systolic function was not well visualized.  The right ventricular size is not well visualized.   3. Left atrial size was moderately dilated.   4. Right atrial size was moderately dilated.   5. The mitral valve is abnormal. Mild mitral valve regurgitation. No  evidence of mitral stenosis.   6. The tricuspid valve is abnormal.   7. The aortic valve was not well visualized. Aortic valve regurgitation  is not visualized. No aortic stenosis is present.   8. The inferior vena cava is normal in size with greater than 50%  respiratory variability, suggesting right atrial pressure of 3 mmHg.   Patient Profile     83 y.o. male with a past medical history significant for Padonda to heart failure with preserved ejection fraction, chronic atrial fibrillation, CAD, COPD on home 2 L of oxygen , CKD stage IIIb and other comorbidities who presents with weight gain, dyspnea and 10 pound increase in his weight. Called and it was recommended him come to the ED for further evaluation. Blood work was done and shows that he is acute on chronic CHF and persistent A-fib.   Assessment & Plan    Acute on chronic HFmrEF:    Intake and output net negative 5761 cc.  Creat is stable.  Continue IV diuresis    Paroxysmal atrial fibrillation/flutter:    Plan DCCV  today.    Coronary artery disease : Known CAD as previously noted.  Continue medical management  Hyperlipidemia :   Continue Crestor .   Last LDL was 71.    Diabetes :  Per primary team      Most recent AIc 6.7.   CKD stage 3b:   Creatinine is unchanged today and tolerating diuresis.  Continue current therapy.     For questions  or updates, please contact [facility] Please consult [email or web address] for contact info under Cardiology/STEMI.   Signed, Lynwood Schilling, MD  08/03/2024, 7:43 AM

## 2024-08-03 NOTE — Progress Notes (Signed)
 PROGRESS NOTE    [name].  FMW:[id number] DOB: [birth year] DOA: 07/30/2024 PCP: [name] HERO, MD   Brief Narrative:  The patient is an 83 year old Caucasian male with a past medical history significant for Padonda to heart failure with preserved ejection fraction, chronic atrial fibrillation, CAD, COPD on home 2 L of oxygen , CKD stage IIIb and other comorbidities who presents with weight gain, dyspnea and 10 pound increase in his weight.  Called his cardiologist who recommended him come to the ED for further evaluation.  Blood work was done and shows that he is acute on chronic CHF and persistent A-fib.  Cardiology consulted and he is being diuresed as below and current plan is for DCCV on Monday, 08/03/2024.  Assessment and Plan:  Acute on Chronic HFpEF: Presents with progressively worsening volume overload and increased weight gain.  Blood pressure has been soft at home and was initiated on IV Lasix  and cardiology is increasing his Furosemide  to 80 mg 3 times daily starting today.  Defer reinitiation of Farxiga  to cardiology.  Home regimen included carvedilol  25 mg twice daily, Farxiga  10 mg daily, Imdur  60 mg once a day and torsemide  60 milligrams daily.  Repeating echo this visit and showed that the EF was reduced to 40% and there was somewhat more global hypokinesis and the endocardium was not well-visualized.  Per Cardiologist some of his ECHO studies were somewhat incomplete and they are recommending repeating ECHO in outpatient setting after sinus rhythm has been restored.  Need strict I's and O's and daily weights.  Intake/Output Summary (Last 24 hours) at 08/03/2024 1241 Last data filed at 08/03/2024 0834 Gross per 24 hour  Intake 558.55 ml  Output 2000 ml  Net -1441.45 ml  -His Imdur  had been held for more blood pressure support.  Cardiology planning on DCCV on Monday unless he spontaneously reverts to normal sinus rhythm over the weekend.  Persistent and chronic A-fib with RVR:  Heart rate in the low 100s in the ED-given metoprolol . Cautiously continuing Coreg  at 12.5 mg p.o. twice daily with holding parameters-but if blood pressure becomes an issue-may need to be switched to amiodarone.  Cardiology also discussed with him about the increased risk of bronchospasm with carvedilol  versus more selective beta-blockers.Continue anticoagulation with apixaban  2.5 mg p.o. twice daily given that his CHA2DS2-VASc score is 8.  Continue with Telemetry monitoring as Cardiology is planning for DCCV on Monday, 08/03/2024   Essential HTN: BP reportedly soft at home. Holding Imdur  for now. Cautiously continue with Coreg .  Cardiology is increase his Lasix  IV to 80 mg 3 times daily dosing.  Continue to monitor blood pressures per protocol.  Last blood pressure reading was 116/60  HLD: C/w Rosuvastatin  20 mg po Daily    CAD s/p PCI/CTO 2014.: Some shortness of breath secondary to CHF-but no chest pain/anginal symptoms.  Patient was admitted to have CTO LAD stented RCA suspect that the patient is not on aspirin  as patient on Eliquis . Continue statin/beta-blocker.  Further care per Cardiology.  Currently they are recommending no ischemia workup planned and recommended continue risk reduction   DM-2: Most recent HbA1c was 6.7. Continue sensitive NovoLog  sliding scale insulin  SSI. CBG Trend:  Recent Labs  Lab 08/02/24 0553 08/02/24 1148 08/02/24 1618 08/02/24 2111 08/03/24 0632 08/03/24 1121 08/03/24 1238  GLUCAP 179* 370* 185* 198* 211* 196* 198*  -Adjust Insulin  regimen as necessary and continue to monitor blood sugars per protocol   CKD stage IIIb: Close to baseline. BUN/Cr Trend: Recent  Labs  Lab 07/09/24 0907 07/15/24 1144 07/30/24 1237 07/31/24 0319 08/01/24 0232 08/02/24 0224  BUN 57* 57* 47* 45* 43* 43*  CREATININE 1.93* 1.71* 1.75* 1.67* 1.63* 1.71*  -Monitor UOP as he is being diuresed. Avoid Nephrotoxic Medications, Contrast Dyes, Hypotension and Dehydration to Ensure  Adequate Renal Perfusion and will need to Renally Adjust Meds. CTM and Trend Renal Function carefully and repeat CMP in the AM   BPH: Continue with Finasteride  5 mg po Daily   Normocytic Anemia: Hgb/Hct Trend:  Recent Labs  Lab 07/09/24 0907 07/30/24 1237 07/31/24 0319 08/01/24 0232 08/02/24 0224  HGB 12.3* 12.3* 11.4* 12.3* 12.1*  HCT 37.7* 39.3 36.2* 38.8* 39.0  MCV 92.5 97.0 96.3 96.0 96.8  -Check Anemia Panel in the AM. CTM for S/Sx of Bleeding; No overt bleeding noted. Repeat CBC in the AM   COPD with chronic hypoxic respiratory failure on home O2-2 L Continue bronchodilators with Breo Ellipta  1 puff IH daily and albuterol  neb 2.5 mg every 2 as needed for wheezing. Remains on oxygen -2 L at baseline. Will need Ambulatory O2 Screen prior to D/C  Constipation: Has not had a bowel movement in about 6 days despite taking as needed MiraLAX  and bisacodyl .  Will schedule MiraLAX  17 g p.o. twice daily and add senna docusate 1 tab p.o. twice daily and as well as provide as needed 10 mg bisacodyl  suppositories rectally   Debility/deconditioning: PT/OT eval recommending Home Health   GERD/GI Prophylaxis: C/w PPI w/ Pantoprazole  40 mg BID  Hypoalbuminemia: Patient's Albumin  Lvl is now 3.0 on last check. CTM and Trend and repeat CMP in the AM  Class II Obesity: Complicates overall prognosis and care. Estimated body mass index is 39.5 kg/m as calculated from the following:   Height as of this encounter: 5' 3 (1.6 m).   Weight as of this encounter: 101.2 kg. Weight Loss and Dietary Counseling given   DVT prophylaxis: Place TED hose Start: 08/01/24 0939 apixaban  (ELIQUIS ) tablet 2.5 mg Start: 07/30/24 2200 apixaban  (ELIQUIS ) tablet 2.5 mg    Code Status: Full Code Family Communication: No family currently at bedside  Disposition Plan:  Level of care: Telemetry Cardiac Status is: Inpatient Remains inpatient appropriate because: Needs further clinical improvement and clearance by  cardiology   Consultants:  Cardiology  Procedures:  DCCV  Antimicrobials:  Anti-infectives (From admission, onward)    None       Subjective: Seen and examined after his cardioversion he states that he is feeling much better.  No nausea or vomiting.  Still has quite a bit of swelling.  Lightheadedness or dizziness.  No other concerns or complaints at this time.  Objective: Vitals:   08/03/24 1215 08/03/24 1220 08/03/24 1238 08/03/24 1606  BP: 126/68 136/71 139/73 (!) 144/77  Pulse: 73 70 80 74  Resp: 15 15 18 20   Temp:  98.2 F (36.8 C) 97.6 F (36.4 C) 98.5 F (36.9 C)  TempSrc:  Temporal Oral Oral  SpO2: 99% 99% 100% 100%  Weight:      Height:        Intake/Output Summary (Last 24 hours) at 08/03/2024 1652 Last data filed at 08/03/2024 9165 Gross per 24 hour  Intake 318.55 ml  Output 1850 ml  Net -1531.45 ml   Filed Weights   08/01/24 0500 08/02/24 0424 08/03/24 0635  Weight: 99.9 kg 100.5 kg 101.2 kg   Examination: Physical Exam:  Constitutional: Elderly obese Caucasian male in no acute distress appears calm Respiratory: Diminished to auscultation  bilaterally bilaterally, no wheezing, rales, rhonchi or crackles. Normal respiratory effort and patient is not tachypenic. No accessory muscle use. Unlabored breathing  Cardiovascular: RRR, no murmurs / rubs / gallops. S1 and S2 auscultated. 1-2+ LE Edema Abdomen: Soft, non-tender, distended secondary body habitus. bowel sounds positive.  GU: Deferred. Musculoskeletal: No clubbing / cyanosis of digits/nails. No joint deformity upper and lower extremities. Skin: No rashes, lesions, ulcers on a limited skin evaluation. No induration; Warm and dry.  Neurologic: CN 2-12 grossly intact with no focal deficits. Romberg sign and cerebellar reflexes not assessed.  Psychiatric: Normal judgment and insight. Alert and oriented x 3. Normal mood and appropriate affect.   Data Reviewed: I have personally reviewed following labs  and imaging studies  CBC: Recent Labs  Lab 07/30/24 1237 07/31/24 0319 08/01/24 0232 08/02/24 0224 08/03/24 1337  WBC 7.0 6.7 6.8 7.7 6.5  NEUTROABS  --   --  4.7 5.5 5.0  HGB 12.3* 11.4* 12.3* 12.1* 12.3*  HCT 39.3 36.2* 38.8* 39.0 40.1  MCV 97.0 96.3 96.0 96.8 96.9  PLT 183 180 186 188 169   Basic Metabolic Panel: Recent Labs  Lab 07/30/24 1237 07/31/24 0319 08/01/24 0232 08/02/24 0224 08/03/24 1337  NA 137 140 138 138 136  K 4.4 4.0 3.6 4.0 4.7  CL 98 99 98 97* 97*  CO2 27 28 31 31 29   GLUCOSE 107* 161* 136* 144* 211*  BUN 47* 45* 43* 43* 42*  CREATININE 1.75* 1.67* 1.63* 1.71* 1.86*  CALCIUM  9.3 9.0 8.8* 8.8* 8.7*  MG 2.3  --  2.3 2.2 2.3  PHOS  --   --  3.5 3.4 3.4   GFR: Estimated Creatinine Clearance: 32.3 mL/min (A) (by C-G formula based on SCr of 1.86 mg/dL (H)). Liver Function Tests: Recent Labs  Lab 07/30/24 1237 08/01/24 0232 08/02/24 0224 08/03/24 1337  AST 23 11* 19 14*  ALT 17 15 22 21   ALKPHOS 55 52 60 56  BILITOT 0.7 0.8 1.0 0.5  PROT 6.0* 5.8* 5.8* 6.0*  ALBUMIN  3.0* 3.1* 3.0* 3.1*   No results for input(s): LIPASE, AMYLASE in the last 168 hours. No results for input(s): AMMONIA in the last 168 hours. Coagulation Profile: No results for input(s): INR, PROTIME in the last 168 hours. Cardiac Enzymes: No results for input(s): CKTOTAL, CKMB, CKMBINDEX, TROPONINI in the last 168 hours. BNP (last 3 results) Recent Labs    06/18/24 0913  PROBNP 151.0*   HbA1C: No results for input(s): HGBA1C in the last 72 hours. CBG: Recent Labs  Lab 08/02/24 2111 08/03/24 0632 08/03/24 1121 08/03/24 1238 08/03/24 1604  GLUCAP 198* 211* 196* 198* 251*   Lipid Profile: No results for input(s): CHOL, HDL, LDLCALC, TRIG, CHOLHDL, LDLDIRECT in the last 72 hours. Thyroid  Function Tests: No results for input(s): TSH, T4TOTAL, FREET4, T3FREE, THYROIDAB in the last 72 hours. Anemia Panel: No results for  input(s): VITAMINB12, FOLATE, FERRITIN, TIBC, IRON , RETICCTPCT in the last 72 hours. Sepsis Labs: No results for input(s): PROCALCITON, LATICACIDVEN in the last 168 hours.  No results found for this or any previous visit (from the past 240 hours).   Radiology Studies: EP STUDY Result Date: 08/03/2024 See surgical note for result.  DG CHEST PORT 1 VIEW Result Date: 08/02/2024 CLINICAL DATA:  Shortness of breath. EXAM: PORTABLE CHEST 1 VIEW COMPARISON:  08/01/2024 FINDINGS: The cardio pericardial silhouette is enlarged. Vascular congestion again noted without overt airspace pulmonary edema. There is some atelectasis or infiltrate in both lung bases, similar  to prior with probable small bilateral pleural effusions. Telemetry leads overlie the chest. IMPRESSION: 1. Enlargement of the cardiopericardial silhouette with vascular congestion. 2. Bibasilar atelectasis or infiltrate with probable small bilateral pleural effusions. Electronically Signed   By: Camellia Candle M.D.   On: 08/02/2024 08:58   Scheduled Meds:  allopurinol   300 mg Oral Daily   apixaban   2.5 mg Oral BID   carvedilol   12.5 mg Oral BID WC   citalopram   10 mg Oral q AM   ferrous sulfate   324 mg Oral Q breakfast   finasteride   5 mg Oral Daily   fluticasone  furoate-vilanterol  1 puff Inhalation Daily   furosemide   80 mg Intravenous TID   guaiFENesin   1,200 mg Oral BID   insulin  aspart  0-9 Units Subcutaneous TID WC   pantoprazole   40 mg Oral BID AC   polyethylene glycol  17 g Oral BID   potassium chloride   20 mEq Oral Daily   rosuvastatin   20 mg Oral Daily   senna-docusate  1 tablet Oral BID   sodium chloride  flush  3 mL Intravenous Q12H   traZODone   100 mg Oral QHS   umeclidinium bromide   1 puff Inhalation Daily   Continuous Infusions:   LOS: 4 days   Alejandro Marker, DO [facility] Available via Epic secure chat 7am-7pm After these hours, please refer to coverage provider listed on  [email or web address] 08/03/2024, 4:52 PM

## 2024-08-03 NOTE — Procedures (Signed)
 Electrical Cardioversion Procedure Note [name] [id number] 12-26-1940  Procedure: Electrical Cardioversion Indications:  Atrial Fibrillation  Procedure Details Consent: Risks of procedure as well as the alternatives and risks of each were explained to the (patient/caregiver).  Consent for procedure obtained. Time Out: Verified patient identification, verified procedure, site/side was marked, verified correct patient position, special equipment/implants available, medications/allergies/relevent history reviewed, required imaging and test results available.  Performed  Patient placed on cardiac monitor, pulse oximetry, supplemental oxygen  as necessary.  Sedation given: Pt sedated by anesthesia with lidocaine  50 mg and diprovan 30 mg IV. Pacer pads placed anterior and posterior chest.  Cardioverted 1 time(s).  Cardioverted at 300J.  Evaluation Findings: Post procedure EKG shows: NSR Complications: None Patient did tolerate procedure well.   [name] 08/03/2024, 10:42 AM

## 2024-08-03 NOTE — Plan of Care (Signed)
 Patient got cardioversion at 11:15 am. Seems to be maintaining a more steady rhythm, with some ectopic beats evident now at 1555.  Velinda Server, RN 08/03/2024 3:57 PM

## 2024-08-03 NOTE — Transfer of Care (Signed)
 Immediate Anesthesia Transfer of Care Note  Patient: [name].  Procedure(s) Performed: CARDIOVERSION  Patient Location: PACU  Anesthesia Type:MAC  Level of Consciousness: awake and alert   Airway & Oxygen  Therapy: Patient Spontanous Breathing and Patient connected to nasal cannula oxygen   Post-op Assessment: Report given to RN and Post -op Vital signs reviewed and stable  Post vital signs: Reviewed and stable  Last Vitals:  Vitals Value Taken Time  BP    Temp    Pulse    Resp    SpO2      Last Pain:  Vitals:   08/03/24 1023  TempSrc: Temporal  PainSc:          Complications: No notable events documented.

## 2024-08-04 DIAGNOSIS — I482 Chronic atrial fibrillation, unspecified: Secondary | ICD-10-CM | POA: Diagnosis not present

## 2024-08-04 DIAGNOSIS — R0602 Shortness of breath: Secondary | ICD-10-CM | POA: Diagnosis not present

## 2024-08-04 DIAGNOSIS — I5043 Acute on chronic combined systolic (congestive) and diastolic (congestive) heart failure: Secondary | ICD-10-CM | POA: Diagnosis not present

## 2024-08-04 LAB — COMPREHENSIVE METABOLIC PANEL WITH GFR
ALT: 19 U/L (ref 0–44)
AST: 13 U/L — ABNORMAL LOW (ref 15–41)
Albumin: 3 g/dL — ABNORMAL LOW (ref 3.5–5.0)
Alkaline Phosphatase: 56 U/L (ref 38–126)
Anion gap: 8 (ref 5–15)
BUN: 46 mg/dL — ABNORMAL HIGH (ref 8–23)
CO2: 33 mmol/L — ABNORMAL HIGH (ref 22–32)
Calcium: 8.8 mg/dL — ABNORMAL LOW (ref 8.9–10.3)
Chloride: 96 mmol/L — ABNORMAL LOW (ref 98–111)
Creatinine, Ser: 1.77 mg/dL — ABNORMAL HIGH (ref 0.61–1.24)
GFR, Estimated: 38 mL/min — ABNORMAL LOW (ref >60)
Glucose, Bld: 153 mg/dL — ABNORMAL HIGH (ref 70–99)
Potassium: 4.6 mmol/L (ref 3.5–5.1)
Sodium: 137 mmol/L (ref 135–145)
Total Bilirubin: 0.6 mg/dL (ref 0.0–1.2)
Total Protein: 5.8 g/dL — ABNORMAL LOW (ref 6.5–8.1)

## 2024-08-04 LAB — GLUCOSE, CAPILLARY
Glucose-Capillary: 166 mg/dL — ABNORMAL HIGH (ref 70–99)
Glucose-Capillary: 312 mg/dL — ABNORMAL HIGH (ref 70–99)
Glucose-Capillary: 251 mg/dL — ABNORMAL HIGH (ref 70–99)
Glucose-Capillary: 179 mg/dL — ABNORMAL HIGH (ref 70–99)

## 2024-08-04 LAB — RETICULOCYTES
Immature Retic Fract: 17.4 % — ABNORMAL HIGH (ref 2.3–15.9)
RBC.: 3.83 MIL/uL — ABNORMAL LOW (ref 4.22–5.81)
Retic Count, Absolute: 70.9 K/uL (ref 19.0–186.0)
Retic Ct Pct: 1.9 % (ref 0.4–3.1)

## 2024-08-04 LAB — IRON AND TIBC
Iron: 17 ug/dL — ABNORMAL LOW (ref 45–182)
Saturation Ratios: 7 % — ABNORMAL LOW (ref 17.9–39.5)
TIBC: 262 ug/dL (ref 250–450)
UIBC: 245 ug/dL

## 2024-08-04 LAB — CBC WITH DIFFERENTIAL/PLATELET
Abs Immature Granulocytes: 0.03 K/uL (ref 0.00–0.07)
Basophils Absolute: 0 K/uL (ref 0.0–0.1)
Basophils Relative: 0 %
Eosinophils Absolute: 0.1 K/uL (ref 0.0–0.5)
Eosinophils Relative: 1 %
HCT: 37.2 % — ABNORMAL LOW (ref 39.0–52.0)
Hemoglobin: 11.6 g/dL — ABNORMAL LOW (ref 13.0–17.0)
Immature Granulocytes: 0 %
Lymphocytes Relative: 12 %
Lymphs Abs: 0.9 K/uL (ref 0.7–4.0)
MCH: 30.3 pg (ref 26.0–34.0)
MCHC: 31.2 g/dL (ref 30.0–36.0)
MCV: 97.1 fL (ref 80.0–100.0)
Monocytes Absolute: 1 K/uL (ref 0.1–1.0)
Monocytes Relative: 13 %
Neutro Abs: 5.3 K/uL (ref 1.7–7.7)
Neutrophils Relative %: 74 %
Platelets: 188 K/uL (ref 150–400)
RBC: 3.83 MIL/uL — ABNORMAL LOW (ref 4.22–5.81)
RDW: 14.4 % (ref 11.5–15.5)
WBC: 7.3 K/uL (ref 4.0–10.5)
nRBC: 0 % (ref 0.0–0.2)

## 2024-08-04 LAB — PHOSPHORUS: Phosphorus: 3.5 mg/dL (ref 2.5–4.6)

## 2024-08-04 LAB — FOLATE: Folate: 28.7 ng/mL (ref >5.9)

## 2024-08-04 LAB — VITAMIN B12: Vitamin B-12: 1100 pg/mL — ABNORMAL HIGH (ref 180–914)

## 2024-08-04 LAB — FERRITIN: Ferritin: 84 ng/mL (ref 24–336)

## 2024-08-04 LAB — MAGNESIUM: Magnesium: 2.3 mg/dL (ref 1.7–2.4)

## 2024-08-04 MED ORDER — INSULIN ASPART 100 UNIT/ML IJ SOLN
3.0000 [IU] | Freq: Three times a day (TID) | INTRAMUSCULAR | Status: DC
  Administered 2024-08-04 – 2024-08-07 (×13): 3 [IU] via SUBCUTANEOUS

## 2024-08-04 NOTE — Progress Notes (Signed)
 Occupational Therapy Treatment and Discharge Patient Details Name: [name]. MRN: [id number] DOB: Jul 23, 1941 Today's Date: 08/04/2024   History of present illness 83 y.o male admitted 07/30/24 for Afib and volume overload. s/p Cardioversion 08/03/24.  PMH: Recent admission 7/2 for AKI and R foot sprain. COPD on 2L O2, T2DM, CAD, PAF on Eliquis , CKD, CVA with left-sided weakness, chronic HFpEF, Lap-Band, gout, HLD, PVD, skin CA   OT comments  This 83 yo male seen today and we discussed energy conservation due to he was not up to getting up and about again (had already been up with PT and mobility team). All education on energy conservation completed and pt without any further concerns per patient, we will sign off.      If plan is discharge home, recommend the following:  A little help with walking and/or transfers;Assistance with cooking/housework;A little help with bathing/dressing/bathroom;Help with stairs or ramp for entrance   Equipment Recommendations  None recommended by OT       Precautions / Restrictions Precautions Precautions: Fall Recall of Precautions/Restrictions: Intact Precaution/Restrictions Comments: Oxygen --wears 2 liters at home Restrictions Weight Bearing Restrictions Per Provider Order: No              ADL either performed or assessed with clinical judgement   ADL                                         General ADL Comments: Education on the 5 P's of energy conservation with pt. With emphasis on sitting to do tasks, wearing O2 while showering, not getting too hot with showering (use vent fan and keep door to bathroom open), when going out and about on hot days make sure the care is started and cooled down before getting into it, using purse lipped breathing and tripod breathing to help get breathing under control when short of breath. Pt stated he did not need the handout that he was familiar with energy conversation. Pt reports he does  not have any concerns about his ADLs at this time and knows that he will just be slower in doing his ADLs              Communication Communication Communication: No apparent difficulties   Cognition Arousal: Alert Behavior During Therapy: WFL for tasks assessed/performed Cognition: No apparent impairments                               Following commands: Intact        Cueing   Cueing Techniques: Verbal cues, Tactile cues        General Comments SpO2 WFL on 2L O2 [name]; DOE 3/4    Pertinent Vitals/ Pain       Pain Assessment Pain Assessment: No/denies pain         Frequency  Min 2X/week        Progress Toward Goals  OT Goals(current goals can now be found in the care plan section)  Progress towards OT goals: Progressing toward goals  Acute Rehab OT Goals Patient Stated Goal: to go home soon OT Goal Formulation: With patient Time For Goal Achievement: 08/14/24 Potential to Achieve Goals: Good  Plan         AM-PAC OT 6 Clicks Daily Activity     Outcome Measure   Help from another person eating meals?:  None Help from another person taking care of personal grooming?: A Little Help from another person toileting, which includes using toliet, bedpan, or urinal?: A Little Help from another person bathing (including washing, rinsing, drying)?: A Little Help from another person to put on and taking off regular upper body clothing?: A Little Help from another person to put on and taking off regular lower body clothing?: A Little 6 Click Score: 19    End of Session    OT Visit Diagnosis: Muscle weakness (generalized) (M62.81)   Activity Tolerance Patient tolerated treatment well   Patient Left in chair;with call bell/phone within reach;with chair alarm set           Time: 8544-8492 OT Time Calculation (min): 12 min  Charges: OT General Charges $OT Visit: 1 Visit OT Treatments $Self Care/Home Management : 8-22 mins  [name] OT Acute  Rehabilitation Services Office [phone]   [name] 08/04/2024, 3:44 PM

## 2024-08-04 NOTE — Inpatient Diabetes Management (Signed)
 Inpatient Diabetes Program Recommendations  AACE/ADA: New Consensus Statement on Inpatient Glycemic Control (2015)  Target Ranges:  Prepandial:   less than 140 mg/dL      Peak postprandial:   less than 180 mg/dL (1-2 hours)      Critically ill patients:  140 - 180 mg/dL   Lab Results  Component Value Date   GLUCAP 312 (H) 08/04/2024   HGBA1C 6.7 (H) 06/18/2024    Review of Glycemic Control  Latest Reference Range & Units 08/03/24 06:32 08/03/24 11:21 08/03/24 12:38 08/03/24 16:04 08/03/24 21:15 08/04/24 06:03 08/04/24 11:52  Glucose-Capillary 70 - 99 mg/dL 788 (H) 803 (H) 801 (H) 251 (H) 198 (H) 166 (H) 312 (H)   Diabetes history: Type 2 DM Outpatient Diabetes medications: Novolog  7-9 units TID, Farxiga  10 mg every day, Trulicity  4.5 mg qwk Current orders for Inpatient glycemic control:  Novolog  0-9 units TID  Inpatient Diabetes Program Recommendations:    Consider adding Novolog  3 units tid meal coverage if eating >50% of meals  Thanks, Clotilda Bull RN, MSN, BC-ADM Inpatient Diabetes Coordinator Team Pager [phone] (8a-5p)

## 2024-08-04 NOTE — Progress Notes (Signed)
 Physical Therapy Treatment Patient Details Name: [name]. MRN: [id number] DOB: [birth year] Today's Date: 08/04/2024   History of Present Illness 83 y.o male admitted 07/30/24 for Afib and volume overload. s/p Cardioversion 08/03/24.  PMH: Recent admission 7/2 for AKI and R foot sprain. COPD on 2L O2, T2DM, CAD, PAF on Eliquis , CKD, CVA with left-sided weakness, chronic HFpEF, Lap-Band, gout, HLD, PVD, skin CA    PT Comments  Pt received in supine, agreeable to therapy session, c/o continued dypsnea and difficulty catching his breath. Pt needing up to CGA for transfer and gait safety with RW support, shorter household distances, and needed chair pulled up to take seated break prior to return to room. Pt DOE 3/4 with minimal exertion, SpO2 WFL on 2L O2 [place] and HR WFL. Pt continues to benefit from PT services to progress toward functional mobility goals, continue to recommend HHPT.     If plan is discharge home, recommend the following: A little help with walking and/or transfers;A little help with bathing/dressing/bathroom;Assistance with cooking/housework;Assist for transportation   Can travel by private vehicle        Equipment Recommendations  None recommended by PT    Recommendations for Other Services       Precautions / Restrictions Precautions Precautions: Fall Recall of Precautions/Restrictions: Intact Precaution/Restrictions Comments: Oxygen      Mobility  Bed Mobility Overal bed mobility: Needs Assistance Bed Mobility: Supine to Sit     Supine to sit: Modified independent (Device/Increase time), HOB elevated, Used rails     General bed mobility comments: Heavy use of bed rail to sit up    Transfers Overall transfer level: Needs assistance Equipment used: Rolling walker (2 wheels) Transfers: Sit to/from Stand, Bed to chair/wheelchair/BSC Sit to Stand: Contact guard assist, Supervision           General transfer comment: EOB> RW and RW<>chair. Pt needs  verbal and tactile cues to reach back for arm rest when sitting to chair as fatigue increases    Ambulation/Gait Ambulation/Gait assistance: Contact guard assist Gait Distance (Feet): 70 Feet (+67ft) Assistive device: Rolling walker (2 wheels) Gait Pattern/deviations: Step-through pattern, Decreased stride length, Trunk flexed Gait velocity: variable, all grossly <0.3 m/s     General Gait Details: Cues for walker management and proximity. Multiple brief standing breaks for pursed-lip breathing. SpO2 WFL on 2L O2 [place], but DOE 3/4. Seated break to recover after ~53ft, then ~12 more feet to sit in recliner.   Stairs             Wheelchair Mobility     Tilt Bed    Modified Rankin (Stroke Patients Only)       Balance Overall balance assessment: Needs assistance Sitting-balance support: Feet supported, No upper extremity supported Sitting balance-Leahy Scale: Good     Standing balance support: During functional activity, Reliant on assistive device for balance, Single extremity supported, Bilateral upper extremity supported Standing balance-Leahy Scale: Poor Standing balance comment: Reliant on RW for balance                            Communication Communication Communication: No apparent difficulties  Cognition Arousal: Alert Behavior During Therapy: WFL for tasks assessed/performed   PT - Cognitive impairments: No apparent impairments                         Following commands: Intact      Cueing Cueing Techniques: Verbal  cues, Tactile cues  Exercises      General Comments General comments (skin integrity, edema, etc.): SpO2 WFL on 2L O2 [place]; DOE 3/4      Pertinent Vitals/Pain Pain Assessment Pain Assessment: No/denies pain    Home Living                          Prior Function            PT Goals (current goals can now be found in the care plan section) Acute Rehab PT Goals Patient Stated Goal: To return home PT  Goal Formulation: With patient Time For Goal Achievement: 08/14/24 Progress towards PT goals: Progressing toward goals    Frequency    Min 3X/week      PT Plan      Co-evaluation              AM-PAC PT 6 Clicks Mobility   Outcome Measure  Help needed turning from your back to your side while in a flat bed without using bedrails?: None Help needed moving from lying on your back to sitting on the side of a flat bed without using bedrails?: A Little (w/o rails) Help needed moving to and from a bed to a chair (including a wheelchair)?: A Little Help needed standing up from a chair using your arms (e.g., wheelchair or bedside chair)?: A Little Help needed to walk in hospital room?: A Little Help needed climbing 3-5 steps with a railing? : A Lot 6 Click Score: 18    End of Session Equipment Utilized During Treatment: Gait belt;Oxygen  Activity Tolerance: Patient tolerated treatment well;Treatment limited secondary to medical complications (Comment);Other (comment) (dyspnea limiting distance) Patient left: in chair;with call bell/phone within reach;with chair alarm set;Other (comment) (reclined, food tray placed in front of him; RN arriving to check his blood sugar) Nurse Communication: Mobility status PT Visit Diagnosis: Unsteadiness on feet (R26.81);Muscle weakness (generalized) (M62.81)     Time: 1208-1228 PT Time Calculation (min) (ACUTE ONLY): 20 min  Charges:    $Gait Training: 8-22 mins PT General Charges $$ ACUTE PT VISIT: 1 Visit                     Ziara Thelander P., PTA Acute Rehabilitation Services Secure Chat Preferred 9a-5:30pm Office: [phone]    Connell HERO [facility] 08/04/2024, 1:18 PM

## 2024-08-04 NOTE — Progress Notes (Signed)
 Mobility Specialist Progress Note:   08/04/24 1100  Therapy Vitals  Temp 98.4 F (36.9 C)  Temp Source Oral  Mobility  Activity Ambulated with assistance  Level of Assistance Contact guard assist, steadying assist  Assistive Device Front wheel walker  Distance Ambulated (ft) 100 ft  Activity Response Tolerated fair  Mobility Referral Yes  Mobility visit 1 Mobility  Mobility Specialist Start Time (ACUTE ONLY) 1100  Mobility Specialist Stop Time (ACUTE ONLY) 1120  Mobility Specialist Time Calculation (min) (ACUTE ONLY) 20 min   Pt agreeable to mobility session. Required only CGA to stand and ambulate. Pt able to incr distance, however incr SOB from yesterday. SpO2 96% on 3LO2. Took several standing breaks throughout. Back in bed with all needs met.   Therisa Rana Mobility Specialist Please contact via SecureChat or  Rehab office at [phone]

## 2024-08-04 NOTE — Progress Notes (Signed)
 PROGRESS NOTE    [name].  FMW:[id number] DOB: 01-24-1941 DOA: 07/30/2024 PCP: [name] HERO, MD   Brief Narrative:  The patient is an 83 year old Caucasian male with a past medical history significant for [place] to heart failure with preserved ejection fraction, chronic atrial fibrillation, CAD, COPD on home 2 L of oxygen , CKD stage IIIb and other comorbidities who presents with weight gain, dyspnea and 10 pound increase in his weight.    The patient called his cardiologist who recommended him come to the ED for further evaluation.  Blood work was done and shows that he is acute on chronic CHF and persistent A-fib.  Cardiology consulted and he is being diuresed as below and he was cardioverted 8/11 and converted to normal sinus rhythm but subsequently he had some ectopic beats. Cardiology continues to recommend diuresis at this time and considering increasing his Lasix  dosing by adding Zaroxolyn  in the morning.  Assessment and Plan:  Acute on Chronic HFpEF: Presents with progressively worsening volume overload and increased weight gain.   -Home regimen included Carvedilol  25 mg twice daily, Farxiga  10 mg daily, Imdur  60 mg once a day and Torsemide  60 mg daily.   -Blood pressure has been soft at home and was initiated on IV Lasix  and cardiology has increased his Furosemide  to 80 mg 3 times daily and are further considering increasing his Lasix  dosing or adding Zaroxolyn  tomorrow.  Defer reinitiation of Farxiga  to cardiology.   -Repeat ECHO this visit showed EF was reduced to 40% and there was somewhat more global hypokinesis and the endocardium was not well-visualized.  Per Cardiologist some of his ECHO studies were somewhat incomplete and they are recommending repeating ECHO in outpatient setting after sinus rhythm has been restored.  Need strict I's and O's and daily weights. (Since Admit he is -6,526.5 Liters). His Imdur  had been held for more blood pressure support.  Cardiology took  the patient for a DCCV on 08/03/24 and now in NSR  Persistent and chronic A-fib with RVR post cardioversion and conversion to normal sinus rhythm.: Heart rate in the low 100s in the ED-given metoprolol . Cautiously continuing Coreg  at 12.5 mg p.o. twice daily with holding parameters-but if blood pressure becomes an issue-may need to be switched to amiodarone.  Cardiology also discussed with him about the increased risk of bronchospasm with carvedilol  versus more selective beta-blockers.Continue anticoagulation with apixaban  2.5 mg p.o. twice daily given that his CHA2DS2-VASc score is 8. Continue with Telemetry monitoring as patient is status post DCCV and now in normal sinus rhythm   Essential HTN: BP reportedly soft at home. Holding Imdur  for now. Cautiously continue with Coreg .  Cardiology is increase his Lasix  IV to 80 mg 3 times daily dosing.  Continue to monitor blood pressures per protocol.  Last blood pressure reading was 127/67  HLD: C/w Rosuvastatin  20 mg po Daily    CAD s/p PCI/CTO 2014.: Some shortness of breath secondary to CHF-but no chest pain/anginal symptoms.  Patient was admitted to have CTO LAD stented RCA suspect that the patient is not on aspirin  as patient on Eliquis . Continue statin/beta-blocker.  Further care per Cardiology.  Currently they are recommending no ischemia workup planned and recommended continue risk reduction   DM-2: Most recent HbA1c was 6.7. Continue sensitive NovoLog  sliding scale insulin  SSI. CBG Trend ranging from 166-312. Adjust Insulin  regimen as necessary and continue to monitor blood sugars per protocol   CKD stage IIIb: Close to baseline. BUN/Cr Trend relatively stable. Last Check  showed BUN/Cr of 46/1.77. Monitor UOP as he is being diuresed. Avoid Nephrotoxic Medications, Contrast Dyes, Hypotension and Dehydration to Ensure Adequate Renal Perfusion and will need to Renally Adjust Meds. CTM and Trend Renal Function carefully and repeat CMP in the AM   BPH:  Continue with Finasteride  5 mg po Daily   Normocytic Anemia: Hgb/Hct Trend stable with last check showing a H/H of 11.6/37.2. Checked Anemia Panel and showed an iron  level of 17, UIBC of 245, TIBC of 262, saturation ratio of 7%, ferritin level 84, folate level of 28.7 and a vitamin B12 level of 1100. CTM for S/Sx of Bleeding; No overt bleeding noted. Repeat CBC in the AM   COPD with chronic hypoxic respiratory failure on home O2-2 L Continue bronchodilators with Breo Ellipta  1 puff IH daily and albuterol  neb 2.5 mg every 2 as needed for wheezing. Remains on oxygen -2 L at baseline. Will need Ambulatory O2 Screen prior to D/C  Constipation: Has not had a bowel movement in about 6 days despite taking as needed MiraLAX  and bisacodyl .  Will schedule MiraLAX  17 g p.o. twice daily and add senna docusate 1 tab p.o. twice daily and as well as provide as needed 10 mg bisacodyl  suppositories rectally   Debility/deconditioning: PT/OT eval recommending Home Health when stable for D/C  GERD/GI Prophylaxis: C/w PPI w/ Pantoprazole  40 mg BID  Hypoalbuminemia: Patient's Albumin  Lvl is now 3.0 again on last check. CTM and Trend and repeat CMP in the AM  Class II Obesity: Complicates overall prognosis and care. Estimated body mass index is 39.68 kg/m as calculated from the following:   Height as of this encounter: 5' 3 (1.6 m).   Weight as of this encounter: 101.6 kg. Weight Loss and Dietary Counseling given   DVT prophylaxis: Place TED hose Start: 08/01/24 0939 apixaban  (ELIQUIS ) tablet 2.5 mg Start: 07/30/24 2200 apixaban  (ELIQUIS ) tablet 2.5 mg    Code Status: Full Code Family Communication: No family present @ bedside  Disposition Plan:  Level of care: Telemetry Cardiac Status is: Inpatient Remains inpatient appropriate because: Needs further clinical improvement and clearance by Cardiology   Consultants:  Cardiology  Procedures:  DCCV  Antimicrobials:  Anti-infectives (From admission,  onward)    None       Subjective: Seen and examined at bedside and he was doing okay and still remains volume overloaded.  States he gained a pound since yesterday.  No nausea or vomiting.  Denies any lightheadedness or dizziness.  No other concerns or complaints at this time.  Objective: Vitals:   08/04/24 0753 08/04/24 1100 08/04/24 1115 08/04/24 1228  BP:   127/67   Pulse:   78   Resp:   20   Temp:  98.4 F (36.9 C) 98.4 F (36.9 C)   TempSrc:  Oral Oral   SpO2: 100%  100% 95%  Weight:      Height:        Intake/Output Summary (Last 24 hours) at 08/04/2024 1354 Last data filed at 08/04/2024 1300 Gross per 24 hour  Intake 900 ml  Output 1370 ml  Net -470 ml   Filed Weights   08/02/24 0424 08/03/24 0635 08/04/24 0355  Weight: 100.5 kg 101.2 kg 101.6 kg   Examination: Physical Exam:  Constitutional: Elderly obese Caucasian male in no acute distress appears calm Respiratory: Diminished to auscultation bilaterally, no wheezing, rales, rhonchi or crackles. Normal respiratory effort and patient is not tachypenic. No accessory muscle use.  Unlabored breathing Cardiovascular: RRR, no  murmurs / rubs / gallops. S1 and S2 auscultated.  Has 1-2+ extremity pitting edema Abdomen: Soft, non-tender, distended secondary to body habitus. bowel sounds positive.  GU: Deferred. Musculoskeletal: No clubbing / cyanosis of digits/nails. No joint deformity upper and lower extremities. Skin: No rashes, lesions, ulcers on the limited skin evaluation. No induration; Warm and dry.  Neurologic: CN 2-12 grossly intact with no focal deficits. Romberg sign and cerebellar reflexes not assessed.  Psychiatric: Normal judgment and insight. Alert and oriented x 3. Normal mood and appropriate affect.   Data Reviewed: I have personally reviewed following labs and imaging studies  CBC: Recent Labs  Lab 07/31/24 0319 08/01/24 0232 08/02/24 0224 08/03/24 1337 08/04/24 0307  WBC 6.7 6.8 7.7 6.5 7.3   NEUTROABS  --  4.7 5.5 5.0 5.3  HGB 11.4* 12.3* 12.1* 12.3* 11.6*  HCT 36.2* 38.8* 39.0 40.1 37.2*  MCV 96.3 96.0 96.8 96.9 97.1  PLT 180 186 188 169 188   Basic Metabolic Panel: Recent Labs  Lab 07/30/24 1237 07/31/24 0319 08/01/24 0232 08/02/24 0224 08/03/24 1337 08/04/24 0307  NA 137 140 138 138 136 137  K 4.4 4.0 3.6 4.0 4.7 4.6  CL 98 99 98 97* 97* 96*  CO2 27 28 31 31 29  33*  GLUCOSE 107* 161* 136* 144* 211* 153*  BUN 47* 45* 43* 43* 42* 46*  CREATININE 1.75* 1.67* 1.63* 1.71* 1.86* 1.77*  CALCIUM  9.3 9.0 8.8* 8.8* 8.7* 8.8*  MG 2.3  --  2.3 2.2 2.3 2.3  PHOS  --   --  3.5 3.4 3.4 3.5   GFR: Estimated Creatinine Clearance: 34 mL/min (A) (by C-G formula based on SCr of 1.77 mg/dL (H)). Liver Function Tests: Recent Labs  Lab 07/30/24 1237 08/01/24 0232 08/02/24 0224 08/03/24 1337 08/04/24 0307  AST 23 11* 19 14* 13*  ALT 17 15 22 21 19   ALKPHOS 55 52 60 56 56  BILITOT 0.7 0.8 1.0 0.5 0.6  PROT 6.0* 5.8* 5.8* 6.0* 5.8*  ALBUMIN  3.0* 3.1* 3.0* 3.1* 3.0*   No results for input(s): LIPASE, AMYLASE in the last 168 hours. No results for input(s): AMMONIA in the last 168 hours. Coagulation Profile: No results for input(s): INR, PROTIME in the last 168 hours. Cardiac Enzymes: No results for input(s): CKTOTAL, CKMB, CKMBINDEX, TROPONINI in the last 168 hours. BNP (last 3 results) Recent Labs    06/18/24 0913  PROBNP 151.0*   HbA1C: No results for input(s): HGBA1C in the last 72 hours. CBG: Recent Labs  Lab 08/03/24 1238 08/03/24 1604 08/03/24 2115 08/04/24 0603 08/04/24 1152  GLUCAP 198* 251* 198* 166* 312*   Lipid Profile: No results for input(s): CHOL, HDL, LDLCALC, TRIG, CHOLHDL, LDLDIRECT in the last 72 hours. Thyroid  Function Tests: No results for input(s): TSH, T4TOTAL, FREET4, T3FREE, THYROIDAB in the last 72 hours. Anemia Panel: Recent Labs    08/04/24 0307  VITAMINB12 1,100*  FOLATE 28.7   FERRITIN 84  TIBC 262  IRON  17*  RETICCTPCT 1.9   Sepsis Labs: No results for input(s): PROCALCITON, LATICACIDVEN in the last 168 hours.  No results found for this or any previous visit (from the past 240 hours).   Radiology Studies: EP STUDY Result Date: 08/03/2024 See surgical note for result.  Scheduled Meds:  allopurinol   300 mg Oral Daily   apixaban   2.5 mg Oral BID   carvedilol   12.5 mg Oral BID WC   citalopram   10 mg Oral q AM   ferrous sulfate   324  mg Oral Q breakfast   finasteride   5 mg Oral Daily   fluticasone  furoate-vilanterol  1 puff Inhalation Daily   furosemide   80 mg Intravenous TID   guaiFENesin   1,200 mg Oral BID   insulin  aspart  0-9 Units Subcutaneous TID WC   pantoprazole   40 mg Oral BID AC   polyethylene glycol  17 g Oral BID   potassium chloride   20 mEq Oral Daily   rosuvastatin   20 mg Oral Daily   senna-docusate  1 tablet Oral BID   sodium chloride  flush  3 mL Intravenous Q12H   traZODone   100 mg Oral QHS   umeclidinium bromide   1 puff Inhalation Daily   Continuous Infusions:   LOS: 5 days   Alejandro Marker, DO [facility] Available via Epic secure chat 7am-7pm After these hours, please refer to coverage provider listed on [email or web address] 08/04/2024, 1:54 PM

## 2024-08-04 NOTE — Anesthesia Postprocedure Evaluation (Signed)
 Anesthesia Post Note  Patient: [name].  Procedure(s) Performed: CARDIOVERSION     Patient location during evaluation: Cath Lab Anesthesia Type: General Level of consciousness: awake and alert Pain management: pain level controlled Vital Signs Assessment: post-procedure vital signs reviewed and stable Respiratory status: spontaneous breathing Cardiovascular status: stable Anesthetic complications: no   No notable events documented.  Last Vitals:  Vitals:   08/04/24 0717 08/04/24 0753  BP: (!) 150/83   Pulse: 76   Resp: 18   Temp: 36.5 C   SpO2: 100% 100%    Last Pain:  Vitals:   08/04/24 0717  TempSrc: Oral  PainSc:                  Norleen Pope

## 2024-08-04 NOTE — TOC Progression Note (Signed)
 Transition of Care (TOC) - Progression Note    Patient Details  Name: [name]. MRN: [id number] Date of Birth: Jan 27, 1941  Transition of Care [facility]) CM/SW Contact  Waddell Barnie Rama, RN Phone Number: 08/04/2024, 10:53 AM  Clinical Narrative:    Patient is active with [facility] for HHPT, will need HHST, orders are in.   Has home oxygen  with [employer].  IP CM following.    Expected Discharge Plan: Home w Home Health Services Barriers to Discharge: Continued Medical Work up               Expected Discharge Plan and Services   Discharge Planning Services: CM Consult Post Acute Care Choice: Home Health Living arrangements for the past 2 months: Single Family Home                           HH Arranged: PT HH Agency: [facility] Date [facility] Agency Contacted: 07/31/24   Representative spoke with at [facility] Agency: Darleene   Social Drivers of Health (SDOH) Interventions SDOH Screenings   Food Insecurity: No Food Insecurity (07/30/2024)  Housing: Low Risk  (07/30/2024)  Transportation Needs: No Transportation Needs (07/31/2024)  Utilities: Not At Risk (07/30/2024)  Alcohol Screen: Low Risk  (07/31/2024)  Depression (PHQ2-9): Low Risk  (07/02/2024)  Recent Concern: Depression (PHQ2-9) - Medium Risk (05/25/2024)  Financial Resource Strain: Low Risk  (07/31/2024)  Physical Activity: Inactive (06/14/2024)  Social Connections: Moderately Isolated (07/30/2024)  Stress: Stress Concern Present (06/14/2024)  Tobacco Use: Medium Risk (08/03/2024)  Health Literacy: Adequate Health Literacy (05/25/2024)    Readmission Risk Interventions    01/15/2024    1:16 PM  Readmission Risk Prevention Plan  Transportation Screening Complete  PCP or Specialist Appt within 5-7 Days Complete  Home Care Screening Complete  Medication Review (RN CM) Complete

## 2024-08-04 NOTE — Progress Notes (Signed)
 Progress Note  Patient Name: [name]. Date of Encounter: 08/04/2024  Primary Cardiologist:   Lonni Cash, MD   Subjective   He is breathing slightly better.  Ambulated in the hallway.  No chest pain.   Inpatient Medications    Scheduled Meds:  allopurinol   300 mg Oral Daily   apixaban   2.5 mg Oral BID   carvedilol   12.5 mg Oral BID WC   citalopram   10 mg Oral q AM   ferrous sulfate   324 mg Oral Q breakfast   finasteride   5 mg Oral Daily   fluticasone  furoate-vilanterol  1 puff Inhalation Daily   furosemide   80 mg Intravenous TID   guaiFENesin   1,200 mg Oral BID   insulin  aspart  0-9 Units Subcutaneous TID WC   pantoprazole   40 mg Oral BID AC   polyethylene glycol  17 g Oral BID   potassium chloride   20 mEq Oral Daily   rosuvastatin   20 mg Oral Daily   senna-docusate  1 tablet Oral BID   sodium chloride  flush  3 mL Intravenous Q12H   traZODone   100 mg Oral QHS   umeclidinium bromide   1 puff Inhalation Daily   Continuous Infusions:   PRN Meds: acetaminophen  **OR** acetaminophen , albuterol , bisacodyl , fluticasone , loratadine , ondansetron  **OR** ondansetron  (ZOFRAN ) IV, sodium chloride  flush   Vital Signs    Vitals:   08/03/24 1940 08/04/24 0001 08/04/24 0355 08/04/24 0717  BP: 131/77 (!) 149/83 (!) 150/78 (!) 150/83  Pulse: 71 70 69 76  Resp: 18 18 16 18   Temp: 98.4 F (36.9 C) 97.6 F (36.4 C) 97.8 F (36.6 C) 97.7 F (36.5 C)  TempSrc: Oral Oral Oral Oral  SpO2: 100% 99% 100% 100%  Weight:   101.6 kg   Height:        Intake/Output Summary (Last 24 hours) at 08/04/2024 0732 Last data filed at 08/04/2024 0600 Gross per 24 hour  Intake 660 ml  Output 1075 ml  Net -415 ml   Filed Weights   08/02/24 0424 08/03/24 0635 08/04/24 0355  Weight: 100.5 kg 101.2 kg 101.6 kg    Telemetry    NA  - Personally Reviewed  ECG     - Personally Reviewed  Physical Exam   GEN: No  acute distress.   Neck:   Positive  JVD Cardiac: RRR, no  murmurs, rubs, or gallops.  Respiratory:    Decreased breath sounds. GI: Soft, nontender, non-distended, normal bowel sounds  MS:  Severe leg edema; No deformity. Neuro:   Nonfocal  Psych: Oriented and appropriate     Labs    Chemistry Recent Labs  Lab 08/02/24 0224 08/03/24 1337 08/04/24 0307  NA 138 136 137  K 4.0 4.7 4.6  CL 97* 97* 96*  CO2 31 29 33*  GLUCOSE 144* 211* 153*  BUN 43* 42* 46*  CREATININE 1.71* 1.86* 1.77*  CALCIUM  8.8* 8.7* 8.8*  PROT 5.8* 6.0* 5.8*  ALBUMIN  3.0* 3.1* 3.0*  AST 19 14* 13*  ALT 22 21 19   ALKPHOS 60 56 56  BILITOT 1.0 0.5 0.6  GFRNONAA 39* 36* 38*  ANIONGAP 10 10 8      Hematology Recent Labs  Lab 08/02/24 0224 08/03/24 1337 08/04/24 0307  WBC 7.7 6.5 7.3  RBC 4.03* 4.14* 3.83*  3.83*  HGB 12.1* 12.3* 11.6*  HCT 39.0 40.1 37.2*  MCV 96.8 96.9 97.1  MCH 30.0 29.7 30.3  MCHC 31.0 30.7 31.2  RDW 14.4 14.5 14.4  PLT 188 169 188    Cardiac EnzymesNo results for input(s): TROPONINI in the last 168 hours. No results for input(s): TROPIPOC in the last 168 hours.   BNP Recent Labs  Lab 07/30/24 1237 08/01/24 0232  BNP 548.0* 588.3*     DDimer No results for input(s): DDIMER in the last 168 hours.   Radiology    EP STUDY Result Date: 08/03/2024 See surgical note for result.   Cardiac Studies   Echo:  1. Technically difficult study, even with the use of echo contrast. Afib  with mildly elevated rates as well limit interpretation LVEF appears to be  mildly reduced at 40%. . Left ventricular ejection fraction, by  estimation, is 40%. The left ventricle  has mildly decreased function. The left ventricle demonstrates regional  wall motion abnormalities (see scoring diagram/findings for description).  Left ventricular diastolic parameters are indeterminate.   2. RV not well visualized, grossly appears normal in size with low normal  function. . Right ventricular systolic function was not well visualized.  The  right ventricular size is not well visualized.   3. Left atrial size was moderately dilated.   4. Right atrial size was moderately dilated.   5. The mitral valve is abnormal. Mild mitral valve regurgitation. No  evidence of mitral stenosis.   6. The tricuspid valve is abnormal.   7. The aortic valve was not well visualized. Aortic valve regurgitation  is not visualized. No aortic stenosis is present.   8. The inferior vena cava is normal in size with greater than 50%  respiratory variability, suggesting right atrial pressure of 3 mmHg.   Patient Profile     83 y.o. male with a past medical history significant for Padonda to heart failure with preserved ejection fraction, chronic atrial fibrillation, CAD, COPD on home 2 L of oxygen , CKD stage IIIb and other comorbidities who presents with weight gain, dyspnea and 10 pound increase in his weight. Called and it was recommended him come to the ED for further evaluation. Blood work was done and shows that he is acute on chronic CHF and persistent A-fib.   Assessment & Plan    Acute on chronic HFmrEF:    Intake and output net negative 6176 cc.  Creat is down slightly today.  Will continue current Lasix  and consider Zaroxolyn  tomorrow or increased Lasix .  Dry weight might be 205 at home.    Paroxysmal atrial fibrillation/flutter:    DCCV yesterday.    Coronary artery disease : Known CAD as previously noted.  Medical management  Hyperlipidemia :   Continue Crestor .    Diabetes :  A1C 6.7.  Per primary team.   CKD stage 3b:   Creatinine is unchanged today and tolerating diuresis.  Continue current therapy.     For questions or updates, please contact [facility] Please consult [email or web address] for contact info under Cardiology/STEMI.   Signed, Lynwood Schilling, MD  08/04/2024, 7:32 AM

## 2024-08-04 NOTE — Plan of Care (Signed)

## 2024-08-05 ENCOUNTER — Encounter (HOSPITAL_COMMUNITY)

## 2024-08-05 DIAGNOSIS — I5041 Acute combined systolic (congestive) and diastolic (congestive) heart failure: Secondary | ICD-10-CM

## 2024-08-05 LAB — GLUCOSE, CAPILLARY
Glucose-Capillary: 128 mg/dL — ABNORMAL HIGH (ref 70–99)
Glucose-Capillary: 370 mg/dL — ABNORMAL HIGH (ref 70–99)
Glucose-Capillary: 257 mg/dL — ABNORMAL HIGH (ref 70–99)
Glucose-Capillary: 78 mg/dL (ref 70–99)

## 2024-08-05 LAB — BASIC METABOLIC PANEL WITH GFR
Anion gap: 11 (ref 5–15)
BUN: 46 mg/dL — ABNORMAL HIGH (ref 8–23)
CO2: 27 mmol/L (ref 22–32)
Calcium: 8.6 mg/dL — ABNORMAL LOW (ref 8.9–10.3)
Chloride: 97 mmol/L — ABNORMAL LOW (ref 98–111)
Creatinine, Ser: 1.6 mg/dL — ABNORMAL HIGH (ref 0.61–1.24)
GFR, Estimated: 43 mL/min — ABNORMAL LOW (ref >60)
Glucose, Bld: 387 mg/dL — ABNORMAL HIGH (ref 70–99)
Potassium: 4.5 mmol/L (ref 3.5–5.1)
Sodium: 135 mmol/L (ref 135–145)

## 2024-08-05 MED ORDER — DAPAGLIFLOZIN PROPANEDIOL 10 MG PO TABS
10.0000 mg | ORAL_TABLET | Freq: Every day | ORAL | Status: DC
  Administered 2024-08-05 – 2024-08-07 (×4): 10 mg via ORAL
  Filled 2024-08-05 (×3): qty 1

## 2024-08-05 MED ORDER — INSULIN GLARGINE-YFGN 100 UNIT/ML ~~LOC~~ SOLN
15.0000 [IU] | Freq: Every day | SUBCUTANEOUS | Status: DC
  Administered 2024-08-05 – 2024-08-06 (×3): 15 [IU] via SUBCUTANEOUS
  Filled 2024-08-05 (×5): qty 0.15

## 2024-08-05 MED ORDER — METOLAZONE 2.5 MG PO TABS
2.5000 mg | ORAL_TABLET | Freq: Once | ORAL | Status: AC
  Administered 2024-08-05 (×2): 2.5 mg via ORAL
  Filled 2024-08-05: qty 1

## 2024-08-05 MED ORDER — IRON SUCROSE 400 MG IVPB - SIMPLE MED
400.0000 mg | Freq: Once | Status: AC
  Administered 2024-08-05 (×2): 400 mg via INTRAVENOUS
  Filled 2024-08-05: qty 400

## 2024-08-05 NOTE — Plan of Care (Signed)
  Problem: Clinical Measurements: Goal: Respiratory complications will improve Outcome: Progressing   Problem: Activity: Goal: Risk for activity intolerance will decrease Outcome: Progressing   Problem: Coping: Goal: Level of anxiety will decrease Outcome: Progressing   Problem: Safety: Goal: Ability to remain free from injury will improve 08/05/2024 2041 by Edith Darice BROCKS, RN Outcome: Progressing 08/05/2024 2035 by Edith Darice BROCKS, RN Outcome: Progressing   Problem: Activity: Goal: Capacity to carry out activities will improve Outcome: Progressing   Problem: Coping: Goal: Ability to adjust to condition or change in health will improve Outcome: Progressing

## 2024-08-05 NOTE — Plan of Care (Signed)
  Problem: Clinical Measurements: Goal: Respiratory complications will improve 08/05/2024 2046 by Edith Darice BROCKS, RN Outcome: Progressing 08/05/2024 2035 by Edith Darice BROCKS, RN Outcome: Progressing   Problem: Activity: Goal: Risk for activity intolerance will decrease 08/05/2024 2046 by Edith Darice BROCKS, RN Outcome: Progressing 08/05/2024 2035 by Edith Darice BROCKS, RN Outcome: Progressing   Problem: Coping: Goal: Level of anxiety will decrease Outcome: Progressing   Problem: Safety: Goal: Ability to remain free from injury will improve 08/05/2024 2041 by Edith Darice BROCKS, RN Outcome: Progressing 08/05/2024 2035 by Edith Darice BROCKS, RN Outcome: Progressing   Problem: Activity: Goal: Capacity to carry out activities will improve Outcome: Progressing   Problem: Coping: Goal: Ability to adjust to condition or change in health will improve Outcome: Progressing

## 2024-08-05 NOTE — Progress Notes (Signed)
 Mobility Specialist Progress Note:   08/05/24 1040  Mobility  Activity Ambulated with assistance  Level of Assistance Contact guard assist, steadying assist  Assistive Device Front wheel walker  Distance Ambulated (ft) 100 ft  Activity Response Tolerated well  Mobility Referral Yes  Mobility visit 1 Mobility  Mobility Specialist Start Time (ACUTE ONLY) 1040  Mobility Specialist Stop Time (ACUTE ONLY) 1100  Mobility Specialist Time Calculation (min) (ACUTE ONLY) 20 min   Pt agreeable to mobility session. Required only minG assist throughout. Attempted ambulation on RA, pt desat. Required 3LO2 to maintain SpO2 >89% with exertion. Pt left sitting in chair with all needs met.   Therisa Rana Mobility Specialist Please contact via SecureChat or  Rehab office at [phone]

## 2024-08-05 NOTE — Plan of Care (Signed)
  Problem: Clinical Measurements: Goal: Respiratory complications will improve Outcome: Progressing   Problem: Activity: Goal: Risk for activity intolerance will decrease Outcome: Progressing   Problem: Coping: Goal: Level of anxiety will decrease Outcome: Progressing   Problem: Safety: Goal: Ability to remain free from injury will improve Outcome: Progressing   

## 2024-08-05 NOTE — Plan of Care (Signed)

## 2024-08-05 NOTE — Progress Notes (Signed)
 Nurse requested Mobility Specialist to perform oxygen  saturation test with pt which includes removing pt from oxygen  both at rest and while ambulating.  Below are the results from that testing.     Patient Saturations on Room Air at Rest = spO2 95%  Patient Saturations on Room Air while Ambulating = sp02 86% .    Patient Saturations on 3 Liters of oxygen  while Ambulating = sp02 93%  At end of testing pt left in room on 3  Liters of oxygen .  Reported results to nurse.   Therisa Rana Mobility Specialist Please contact via SecureChat or  Rehab office at [phone]

## 2024-08-05 NOTE — Progress Notes (Addendum)
 PROGRESS NOTE    [name].  FMW:[id number] DOB: [birth year] DOA: 07/30/2024 PCP: [name] HERO, MD   83/M w chronic diastolic CHF, paroxysmal atrial fibrillation, CAD, COPD on home 2 L of oxygen , CKD stage IIIb and other comorbidities who presents with weight gain, dyspnea and 15 pound weight gain, called cards clinic, recommended to come to the ER, workup in the ED noted fluid overload and persistent A-fib. - Cards following, improving with diuresis, cardioverted 8/11  Subjective: Still with some dyspnea, bothered by persistence of significant swelling, some improvement reported overall  Assessment and Plan:  Acute on Chronic HFpEF: Presents with progressively worsening volume overload and increased weight gain.   -Despite compliance with diet, diuretics etc. -Echo 8/7 with EF 40%, wall motion abnormality, mildly reduced RV - Remains volume overloaded, per patient his dry weight is around 207 LB, now 224 LB, continue diuretics Lasix  80 mg 3 times daily, Farxiga , getting Zaroxolyn  today, 7.7 L negative thus far - Add Unna boots   Paroxysmal A-fib with RVR -sp cardioversion 8/11 -Currently in sinus rhythm, continue carvedilol  and apixaban    Essential HTN: Stable, diuretics and Coreg  as above   CAD s/p PCI/CTO 2014.:  - Continue statin/beta-blocker, Eliquis    DM-2: Most recent HbA1c was 6.7.  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 Remains on oxygen -2 L at baseline.   Constipation:  - Improved with laxatives   Debility/deconditioning: PT/OT eval recommending Home Health when stable for  D/C   GERD/GI Prophylaxis: C/w PPI w/ Pantoprazole  40 mg BID   Hypoalbuminemia: Patient's Albumin  Lvl is now 3.0 again on last check. CTM and Trend and repeat CMP in the AM   Class II Obesity: Complicates overall prognosis and care. Estimated body mass index is 39.68 kg/m as calculated from the following:   Height as of this encounter: 5' 3 (1.6 m).   Weight as of this encounter: 101.6 kg. Weight Loss and Dietary Counseling given    DVT prophylaxis: Eliquis    Code Status: Full Code Family Communication: No family present @ bedside  Disposition Plan: Home likely 2 to 3 days  Consultants: Cards   Procedures:   Antimicrobials:    Objective: Vitals:   08/05/24 0042 08/05/24 0611 08/05/24 0709 08/05/24 0730  BP: 124/63 (!) 136/57 (!) 154/71   Pulse: 67 67 74   Resp: 20 18 20    Temp: 97.8 F (36.6 C) 97.8 F (36.6 C) 97.6 F (36.4 C)   TempSrc: Oral Oral Oral   SpO2: 100% 96% 96% 96%  Weight:  101.6 kg    Height:        Intake/Output Summary (Last 24 hours) at 08/05/2024 0846 Last data filed at 08/05/2024 0654 Gross per 24 hour  Intake 360 ml  Output 1920 ml  Net -1560 ml   Filed Weights   08/03/24 0635 08/04/24 0355 08/05/24 0611  Weight: 101.2 kg 101.6 kg 101.6 kg    Examination:  General exam: Appears calm and comfortable, obese pleasant, no distress Respiratory system: Decreased breath sounds at the bases Cardiovascular system: S1 & S2 heard,  RRR.  Abd: nondistended, soft and nontender.Normal bowel sounds heard. Central nervous system: Alert and oriented. No focal neurological deficits. Extremities: 2plus  edema Skin: No rashes Psychiatry:  Mood & affect appropriate.     Data Reviewed:   CBC: Recent Labs  Lab 07/31/24 0319 08/01/24 0232 08/02/24 0224 08/03/24 1337 08/04/24 0307  WBC 6.7 6.8 7.7 6.5 7.3  NEUTROABS  --  4.7 5.5 5.0 5.3  HGB 11.4* 12.3* 12.1* 12.3* 11.6*  HCT 36.2* 38.8* 39.0 40.1 37.2*  MCV 96.3 96.0 96.8 96.9 97.1  PLT 180  186 188 169 188   Basic Metabolic Panel: Recent Labs  Lab 07/30/24 1237 07/31/24 0319 08/01/24 0232 08/02/24 0224 08/03/24 1337 08/04/24 0307  NA 137 140 138 138 136 137  K 4.4 4.0 3.6 4.0 4.7 4.6  CL 98 99 98 97* 97* 96*  CO2 27 28 31 31 29  33*  GLUCOSE 107* 161* 136* 144* 211* 153*  BUN 47* 45* 43* 43* 42* 46*  CREATININE 1.75* 1.67* 1.63* 1.71* 1.86* 1.77*  CALCIUM  9.3 9.0 8.8* 8.8* 8.7* 8.8*  MG 2.3  --  2.3 2.2 2.3 2.3  PHOS  --   --  3.5 3.4 3.4 3.5   GFR: Estimated Creatinine Clearance: 34 mL/min (A) (by C-G formula based on SCr of 1.77 mg/dL (H)). Liver Function Tests: Recent Labs  Lab 07/30/24 1237 08/01/24 0232 08/02/24 0224 08/03/24 1337 08/04/24 0307  AST 23 11* 19 14* 13*  ALT 17 15 22 21 19   ALKPHOS 55 52 60 56 56  BILITOT 0.7 0.8 1.0 0.5 0.6  PROT 6.0* 5.8* 5.8* 6.0* 5.8*  ALBUMIN  3.0* 3.1* 3.0* 3.1* 3.0*   No results for input(s): LIPASE, AMYLASE in the last 168 hours. No results for input(s): AMMONIA in the last 168 hours. Coagulation Profile: No results for input(s): INR, PROTIME in the last 168 hours. Cardiac Enzymes: No results for input(s): CKTOTAL, CKMB, CKMBINDEX, TROPONINI in the last 168 hours. BNP (last 3 results) Recent Labs    06/18/24 0913  PROBNP 151.0*   HbA1C: No results for input(s): HGBA1C in the last 72 hours. CBG: Recent Labs  Lab 08/04/24 0603 08/04/24 1152 08/04/24 1556 08/04/24 2052 08/05/24 0610  GLUCAP 166* 312* 251* 179* 128*   Lipid Profile: No results for input(s): CHOL, HDL, LDLCALC, TRIG, CHOLHDL, LDLDIRECT in the last 72 hours. Thyroid  Function Tests: No results for input(s): TSH, T4TOTAL, FREET4, T3FREE, THYROIDAB in the last 72 hours. Anemia Panel: Recent Labs    08/04/24 0307  VITAMINB12 1,100*  FOLATE 28.7  FERRITIN 84  TIBC 262  IRON  17*  RETICCTPCT 1.9   Urine analysis:    Component Value Date/Time   COLORURINE YELLOW 06/24/2024 0909    APPEARANCEUR HAZY (A) 06/24/2024 0909   LABSPEC 1.007 06/24/2024 0909   PHURINE 6.0 06/24/2024 0909   GLUCOSEU >=500 (A) 06/24/2024 0909   GLUCOSEU >=1000 (A) 09/20/2023 0945   HGBUR NEGATIVE 06/24/2024 0909   BILIRUBINUR NEGATIVE 06/24/2024 0909   BILIRUBINUR neg 12/13/2023 0852   KETONESUR NEGATIVE 06/24/2024 0909   PROTEINUR NEGATIVE 06/24/2024 0909   UROBILINOGEN negative (A) 12/13/2023 0852   UROBILINOGEN 0.2 09/20/2023 0945   NITRITE NEGATIVE 06/24/2024 0909   LEUKOCYTESUR LARGE (A) 06/24/2024 0909   Sepsis Labs: @LABRCNTIP (procalcitonin:4,lacticidven:4)  )No results found for this or any previous visit (from the past 240 hours).   Radiology Studies: EP STUDY Result Date: 08/03/2024 See surgical note for result.    Scheduled Meds:  allopurinol   300 mg Oral  Daily   apixaban   2.5 mg Oral BID   carvedilol   12.5 mg Oral BID WC   citalopram   10 mg Oral q AM   ferrous sulfate   324 mg Oral Q breakfast   finasteride   5 mg Oral Daily   fluticasone  furoate-vilanterol  1 puff Inhalation Daily   furosemide   80 mg Intravenous TID   guaiFENesin   1,200 mg Oral BID   insulin  aspart  0-9 Units Subcutaneous TID WC   insulin  aspart  3 Units Subcutaneous TID WC   pantoprazole   40 mg Oral BID AC   polyethylene glycol  17 g Oral BID   potassium chloride   20 mEq Oral Daily   rosuvastatin   20 mg Oral Daily   senna-docusate  1 tablet Oral BID   sodium chloride  flush  3 mL Intravenous Q12H   traZODone   100 mg Oral QHS   umeclidinium bromide   1 puff Inhalation Daily   Continuous Infusions:  iron  sucrose       LOS: 6 days    Time spent:    Sigurd Pac, MD [facility]   08/05/2024, 8:46 AM

## 2024-08-05 NOTE — Progress Notes (Signed)
 Orthopedic Tech Progress Note Patient Details:  [name]. [birth year] [id number]  Ortho Devices Type of Ortho Device: Nonie boot Ortho Device/Splint Location: BLE Ortho Device/Splint Interventions: Application   Post Interventions Patient Tolerated: Well  Halsey Hammen E Tristine Langi 08/05/2024, 11:19 AM

## 2024-08-05 NOTE — Progress Notes (Signed)
 Progress Note  Patient Name: [name]. Date of Encounter: 08/05/2024  Primary Cardiologist:   Lonni Cash, MD   Subjective   He says that he is not breathing at baseline.  He is not happy with his leg swelling.  Overall no acute complaints except constipation.   Inpatient Medications    Scheduled Meds:  allopurinol   300 mg Oral Daily   apixaban   2.5 mg Oral BID   carvedilol   12.5 mg Oral BID WC   citalopram   10 mg Oral q AM   ferrous sulfate   324 mg Oral Q breakfast   finasteride   5 mg Oral Daily   fluticasone  furoate-vilanterol  1 puff Inhalation Daily   furosemide   80 mg Intravenous TID   guaiFENesin   1,200 mg Oral BID   insulin  aspart  0-9 Units Subcutaneous TID WC   insulin  aspart  3 Units Subcutaneous TID WC   pantoprazole   40 mg Oral BID AC   polyethylene glycol  17 g Oral BID   potassium chloride   20 mEq Oral Daily   rosuvastatin   20 mg Oral Daily   senna-docusate  1 tablet Oral BID   sodium chloride  flush  3 mL Intravenous Q12H   traZODone   100 mg Oral QHS   umeclidinium bromide   1 puff Inhalation Daily   Continuous Infusions:  iron  sucrose      PRN Meds: acetaminophen  **OR** acetaminophen , albuterol , bisacodyl , fluticasone , loratadine , ondansetron  **OR** ondansetron  (ZOFRAN ) IV, sodium chloride  flush   Vital Signs    Vitals:   08/05/24 0042 08/05/24 0611 08/05/24 0709 08/05/24 0730  BP: 124/63 (!) 136/57 (!) 154/71   Pulse: 67 67 74   Resp: 20 18 20    Temp: 97.8 F (36.6 C) 97.8 F (36.6 C) 97.6 F (36.4 C)   TempSrc: Oral Oral Oral   SpO2: 100% 96% 96% 96%  Weight:  101.6 kg    Height:        Intake/Output Summary (Last 24 hours) at 08/05/2024 0847 Last data filed at 08/05/2024 0654 Gross per 24 hour  Intake 360 ml  Output 1920 ml  Net -1560 ml   Filed Weights   08/03/24 0635 08/04/24 0355 08/05/24 0611  Weight: 101.2 kg 101.6 kg 101.6 kg    Telemetry    NSR  - Personally Reviewed  ECG     NA - Personally  Reviewed  Physical Exam   GEN: No  acute distress.   Neck: No  JVD Cardiac: RRR, no murmurs, rubs, or gallops.  Respiratory:   Decreased breath sounds bilaterally GI: Soft, nontender, non-distended, normal bowel sounds  MS:  Moderate leg edema; No deformity. Neuro:   Nonfocal  Psych: Oriented and appropriate    Labs    Chemistry Recent Labs  Lab 08/02/24 0224 08/03/24 1337 08/04/24 0307  NA 138 136 137  K 4.0 4.7 4.6  CL 97* 97* 96*  CO2 31 29 33*  GLUCOSE 144* 211* 153*  BUN 43* 42* 46*  CREATININE 1.71* 1.86* 1.77*  CALCIUM  8.8* 8.7* 8.8*  PROT 5.8* 6.0* 5.8*  ALBUMIN  3.0* 3.1* 3.0*  AST 19 14* 13*  ALT 22 21 19   ALKPHOS 60 56 56  BILITOT 1.0 0.5 0.6  GFRNONAA 39* 36* 38*  ANIONGAP 10 10 8      Hematology Recent Labs  Lab 08/02/24 0224 08/03/24 1337 08/04/24 0307  WBC 7.7 6.5 7.3  RBC 4.03* 4.14* 3.83*  3.83*  HGB 12.1* 12.3* 11.6*  HCT 39.0 40.1 37.2*  MCV 96.8 96.9 97.1  MCH 30.0 29.7 30.3  MCHC 31.0 30.7 31.2  RDW 14.4 14.5 14.4  PLT 188 169 188    Cardiac EnzymesNo results for input(s): TROPONINI in the last 168 hours. No results for input(s): TROPIPOC in the last 168 hours.   BNP Recent Labs  Lab 07/30/24 1237 08/01/24 0232  BNP 548.0* 588.3*     DDimer No results for input(s): DDIMER in the last 168 hours.   Radiology    EP STUDY Result Date: 08/03/2024 See surgical note for result.   Cardiac Studies   Echo:  1. Technically difficult study, even with the use of echo contrast. Afib  with mildly elevated rates as well limit interpretation LVEF appears to be  mildly reduced at 40%. . Left ventricular ejection fraction, by  estimation, is 40%. The left ventricle  has mildly decreased function. The left ventricle demonstrates regional  wall motion abnormalities (see scoring diagram/findings for description).  Left ventricular diastolic parameters are indeterminate.   2. RV not well visualized, grossly appears normal in size  with low normal  function. . Right ventricular systolic function was not well visualized.  The right ventricular size is not well visualized.   3. Left atrial size was moderately dilated.   4. Right atrial size was moderately dilated.   5. The mitral valve is abnormal. Mild mitral valve regurgitation. No  evidence of mitral stenosis.   6. The tricuspid valve is abnormal.   7. The aortic valve was not well visualized. Aortic valve regurgitation  is not visualized. No aortic stenosis is present.   8. The inferior vena cava is normal in size with greater than 50%  respiratory variability, suggesting right atrial pressure of 3 mmHg.   Patient Profile     83 y.o. male with a past medical history significant for Padonda to heart failure with preserved ejection fraction, chronic atrial fibrillation, CAD, COPD on home 2 L of oxygen , CKD stage IIIb and other comorbidities who presents with weight gain, dyspnea and 10 pound increase in his weight. Called and it was recommended him come to the ED for further evaluation. Blood work was done and shows that he is acute on chronic CHF and persistent A-fib.   Assessment & Plan    Acute on chronic HFmrEF:    Intake and output net negative 7736 cc.  Creat is down slightly today.  I will give one dose of zaroxolyn  today.  Dry weight might be 205 at home.   I think he will be ready for discharge tomorrow to continue diuresis at home. .   Paroxysmal atrial fibrillation/flutter:    DCCV .  Maintaining NSR.    Coronary artery disease :Medical management.  No evidence of active ischemia.     Hyperlipidemia :  Continue Crestor .   Diabetes :  A1C 6.7.  Per primary team.   CKD stage 3b:   Creatinine is slightly better over the course of this admission and tolerating anticoagulation.    For questions or updates, please contact [facility] Please consult [email or web address] for contact info under Cardiology/STEMI.   Signed, Lynwood Schilling, MD  08/05/2024, 8:47  AM

## 2024-08-06 DIAGNOSIS — I5041 Acute combined systolic (congestive) and diastolic (congestive) heart failure: Secondary | ICD-10-CM | POA: Diagnosis not present

## 2024-08-06 DIAGNOSIS — I5021 Acute systolic (congestive) heart failure: Secondary | ICD-10-CM

## 2024-08-06 LAB — BASIC METABOLIC PANEL WITH GFR
Anion gap: 12 (ref 5–15)
BUN: 45 mg/dL — ABNORMAL HIGH (ref 8–23)
CO2: 34 mmol/L — ABNORMAL HIGH (ref 22–32)
Calcium: 9.1 mg/dL (ref 8.9–10.3)
Chloride: 91 mmol/L — ABNORMAL LOW (ref 98–111)
Creatinine, Ser: 1.46 mg/dL — ABNORMAL HIGH (ref 0.61–1.24)
GFR, Estimated: 48 mL/min — ABNORMAL LOW (ref >60)
Glucose, Bld: 104 mg/dL — ABNORMAL HIGH (ref 70–99)
Potassium: 3.6 mmol/L (ref 3.5–5.1)
Sodium: 137 mmol/L (ref 135–145)

## 2024-08-06 LAB — GLUCOSE, CAPILLARY
Glucose-Capillary: 116 mg/dL — ABNORMAL HIGH (ref 70–99)
Glucose-Capillary: 117 mg/dL — ABNORMAL HIGH (ref 70–99)
Glucose-Capillary: 123 mg/dL — ABNORMAL HIGH (ref 70–99)
Glucose-Capillary: 143 mg/dL — ABNORMAL HIGH (ref 70–99)
Glucose-Capillary: 161 mg/dL — ABNORMAL HIGH (ref 70–99)
Glucose-Capillary: 237 mg/dL — ABNORMAL HIGH (ref 70–99)

## 2024-08-06 MED ORDER — POTASSIUM CHLORIDE CRYS ER 20 MEQ PO TBCR
40.0000 meq | EXTENDED_RELEASE_TABLET | Freq: Once | ORAL | Status: DC
  Filled 2024-08-06: qty 2

## 2024-08-06 MED ORDER — METOLAZONE 2.5 MG PO TABS
2.5000 mg | ORAL_TABLET | Freq: Once | ORAL | Status: AC
  Administered 2024-08-06: 2.5 mg via ORAL
  Filled 2024-08-06: qty 1

## 2024-08-06 MED ORDER — POTASSIUM CHLORIDE 20 MEQ PO PACK
40.0000 meq | PACK | Freq: Two times a day (BID) | ORAL | Status: AC
  Administered 2024-08-06 (×2): 40 meq via ORAL
  Filled 2024-08-06 (×2): qty 2

## 2024-08-06 NOTE — Progress Notes (Signed)
 Mobility Specialist Progress Note:    08/06/24 0958  Mobility  Activity Ambulated with assistance  Level of Assistance Standby assist, set-up cues, supervision of patient - no hands on  Assistive Device Four wheel walker  Distance Ambulated (ft) 100 ft  Activity Response Tolerated well  Mobility Referral Yes  Mobility visit 1 Mobility  Mobility Specialist Start Time (ACUTE ONLY) [id number]  Mobility Specialist Stop Time (ACUTE ONLY) 1027  Mobility Specialist Time Calculation (min) (ACUTE ONLY) 29 min   Pt pleasant and agreeable to session. Interested in using rollator and seemed to like it more once getting use to it. No c/o any symptoms but did take one seated break upon reaching nurses desk. Returned pt to room at sink to perform face washing and care. Notified RN.   Venetia Keel Mobility Specialist Please Neurosurgeon or Rehab Office at [phone]

## 2024-08-06 NOTE — Progress Notes (Signed)
 REDS Clip  READING (normal 20-35%) = 34%  CHEST RULER (in) =30 Clip Station =  B  Patient tolerated Reds Clip reading well.   Stephane Haddock, BSN, Scientist, clinical (histocompatibility and immunogenetics) Only

## 2024-08-06 NOTE — Progress Notes (Signed)
 PROGRESS NOTE    [name].  FMW:[id number] DOB: 26-Sep-1941 DOA: 07/30/2024 PCP: [name] HERO, MD   83/M w chronic diastolic CHF, paroxysmal atrial fibrillation, CAD, COPD on home 2 L of oxygen , CKD stage IIIb and other comorbidities who presents with weight gain, dyspnea and 15 pound weight gain, called cards clinic, recommended to come to the ER, workup in the ED noted fluid overload and persistent A-fib. - Cards following, improving with diuresis, cardioverted 8/11 - Slowly improving with diuresis  Subjective: Some anxiety overnight  Assessment and Plan:  Acute on Chronic HFpEF: Presents with progressively worsening volume overload and increased weight gain.   -Despite compliance with diet, diuretics etc. -Echo 8/7 with EF 40%, wall motion abnormality, mildly reduced RV - Remains volume overloaded, per patient his dry weight is around 207 LB, was 224 LB yesterday, now down to 220 today - Continue high-dose IV Lasix , Farxiga , brisk diuresis yesterday, 11.8 L negative so far - UNNA boots, consider for DAMP HF tomorrow   Paroxysmal A-fib with RVR -sp cardioversion 8/11 -Currently in sinus rhythm, continue carvedilol  and apixaban    Essential HTN: Stable, diuretics and Coreg  as above   CAD s/p PCI/CTO 2014.:  - Continue statin/beta-blocker, Eliquis    DM-2: Most recent HbA1c was 6.7.  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 Remains on oxygen -2 L at baseline.   Constipation:  - Improved with laxatives   Debility/deconditioning: PT/OT eval recommending Home Health when stable for D/C    GERD/GI Prophylaxis: C/w PPI w/ Pantoprazole  40 mg BID   Hypoalbuminemia: Patient's Albumin  Lvl is now 3.0 again on last check. CTM and Trend and repeat CMP in the AM   Class II Obesity: Complicates overall prognosis and care. Estimated body mass index is 39.68 kg/m as calculated from the following:   Height as of this encounter: 5' 3 (1.6 m).   Weight as of this encounter: 101.6 kg. Weight Loss and Dietary Counseling given    DVT prophylaxis: Eliquis    Code Status: Full Code Family Communication: No family present @ bedside  Disposition Plan: Home 48h  Consultants: Cards   Procedures:   Antimicrobials:    Objective: Vitals:   08/06/24 0023 08/06/24 0455 08/06/24 0733 08/06/24 0747  BP: 134/78 (!) 145/52  (!) 155/69  Pulse:      Resp: 18 18  15   Temp: 97.7 F (36.5 C) 97.6 F (36.4 C)  97.7 F (36.5 C)  TempSrc: Oral Oral  Oral  SpO2: 99% 96% 96% 96%  Weight:  100 kg    Height:        Intake/Output Summary (Last 24 hours) at 08/06/2024 1201 Last data filed at 08/06/2024 0700 Gross per 24 hour  Intake 837 ml  Output 4525 ml  Net -3688 ml   Filed Weights   08/04/24 0355 08/05/24 0611 08/06/24 0455  Weight: 101.6 kg 101.6 kg 100 kg    Examination:  General exam: Appears calm and comfortable, obese pleasant, no distress ENT: Positive JVD Respiratory system: Decreased breath sounds at the bases Cardiovascular system: S1 & S2 heard,  RRR.  Abd: nondistended, soft and nontender.Normal bowel sounds heard. Central nervous system: Alert and oriented. No focal neurological deficits. Extremities: 2plus  edema Skin: No rashes Psychiatry:  Mood & affect appropriate.     Data Reviewed:   CBC: Recent Labs  Lab 07/31/24 0319 08/01/24 0232 08/02/24 0224 08/03/24 1337 08/04/24 0307  WBC 6.7 6.8 7.7 6.5 7.3  NEUTROABS  --  4.7 5.5 5.0 5.3  HGB 11.4* 12.3* 12.1* 12.3* 11.6*  HCT 36.2* 38.8* 39.0 40.1 37.2*  MCV 96.3 96.0 96.8 96.9 97.1  PLT 180 186 188 169  188   Basic Metabolic Panel: Recent Labs  Lab 07/30/24 1237 07/31/24 0319 08/01/24 0232 08/02/24 0224 08/03/24 1337 08/04/24 0307 08/05/24 0924 08/06/24 0327  NA 137   < > 138 138 136 137 135 137  K 4.4   < > 3.6 4.0 4.7 4.6 4.5 3.6  CL 98   < > 98 97* 97* 96* 97* 91*  CO2 27   < > 31 31 29  33* 27 34*  GLUCOSE 107*   < > 136* 144* 211* 153* 387* 104*  BUN 47*   < > 43* 43* 42* 46* 46* 45*  CREATININE 1.75*   < > 1.63* 1.71* 1.86* 1.77* 1.60* 1.46*  CALCIUM  9.3   < > 8.8* 8.8* 8.7* 8.8* 8.6* 9.1  MG 2.3  --  2.3 2.2 2.3 2.3  --   --   PHOS  --   --  3.5 3.4 3.4 3.5  --   --    < > = values in this interval not displayed.   GFR: Estimated Creatinine Clearance: 40.9 mL/min (A) (by C-G formula based on SCr of 1.46 mg/dL (H)). Liver Function Tests: Recent Labs  Lab 07/30/24 1237 08/01/24 0232 08/02/24 0224 08/03/24 1337 08/04/24 0307  AST 23 11* 19 14* 13*  ALT 17 15 22 21 19   ALKPHOS 55 52 60 56 56  BILITOT 0.7 0.8 1.0 0.5 0.6  PROT 6.0* 5.8* 5.8* 6.0* 5.8*  ALBUMIN  3.0* 3.1* 3.0* 3.1* 3.0*   No results for input(s): LIPASE, AMYLASE in the last 168 hours. No results for input(s): AMMONIA in the last 168 hours. Coagulation Profile: No results for input(s): INR, PROTIME in the last 168 hours. Cardiac Enzymes: No results for input(s): CKTOTAL, CKMB, CKMBINDEX, TROPONINI in the last 168 hours. BNP (last 3 results) Recent Labs    06/18/24 0913  PROBNP 151.0*   HbA1C: No results for input(s): HGBA1C in the last 72 hours. CBG: Recent Labs  Lab 08/05/24 2048 08/06/24 0020 08/06/24 0651 08/06/24 0745 08/06/24 1153  GLUCAP 78 117* 123* 116* 237*   Lipid Profile: No results for input(s): CHOL, HDL, LDLCALC, TRIG, CHOLHDL, LDLDIRECT in the last 72 hours. Thyroid  Function Tests: No results for input(s): TSH, T4TOTAL, FREET4, T3FREE, THYROIDAB in the last 72 hours. Anemia Panel: Recent Labs    08/04/24 0307  VITAMINB12  1,100*  FOLATE 28.7  FERRITIN 84  TIBC 262  IRON  17*  RETICCTPCT 1.9   Urine analysis:    Component Value Date/Time   COLORURINE YELLOW 06/24/2024 0909   APPEARANCEUR HAZY (A) 06/24/2024 0909   LABSPEC 1.007 06/24/2024 0909   PHURINE 6.0 06/24/2024 0909   GLUCOSEU >=500 (A) 06/24/2024 0909   GLUCOSEU >=1000 (A) 09/20/2023 0945   HGBUR NEGATIVE 06/24/2024 0909   BILIRUBINUR NEGATIVE 06/24/2024 0909   BILIRUBINUR neg 12/13/2023 0852   KETONESUR NEGATIVE 06/24/2024 0909   PROTEINUR NEGATIVE 06/24/2024 0909   UROBILINOGEN negative (  A) 12/13/2023 0852   UROBILINOGEN 0.2 09/20/2023 0945   NITRITE NEGATIVE 06/24/2024 0909   LEUKOCYTESUR LARGE (A) 06/24/2024 0909   Sepsis Labs: @LABRCNTIP (procalcitonin:4,lacticidven:4)  )No results found for this or any previous visit (from the past 240 hours).   Radiology Studies: No results found.    Scheduled Meds:  allopurinol   300 mg Oral Daily   apixaban   2.5 mg Oral BID   carvedilol   12.5 mg Oral BID WC   citalopram   10 mg Oral q AM   dapagliflozin  propanediol  10 mg Oral Daily   ferrous sulfate   324 mg Oral Q breakfast   finasteride   5 mg Oral Daily   fluticasone  furoate-vilanterol  1 puff Inhalation Daily   furosemide   80 mg Intravenous TID   guaiFENesin   1,200 mg Oral BID   insulin  aspart  0-9 Units Subcutaneous TID WC   insulin  aspart  3 Units Subcutaneous TID WC   insulin  glargine-yfgn  15 Units Subcutaneous Daily   metolazone   2.5 mg Oral Once   pantoprazole   40 mg Oral BID AC   polyethylene glycol  17 g Oral BID   potassium chloride   20 mEq Oral Daily   potassium chloride   40 mEq Oral Once   rosuvastatin   20 mg Oral Daily   senna-docusate  1 tablet Oral BID   sodium chloride  flush  3 mL Intravenous Q12H   traZODone   100 mg Oral QHS   umeclidinium bromide   1 puff Inhalation Daily   Continuous Infusions:     LOS: 7 days    Time spent:    Sigurd Pac, MD [facility]   08/06/2024, 12:01 PM

## 2024-08-06 NOTE — Progress Notes (Signed)
 Progress Note  Patient Name: [name]. Date of Encounter: 08/06/2024  Primary Cardiologist:   Lonni Cash, MD   Subjective   Still not breathing at baseline.  He does feel better with significant urine output yesterday.  Ambulated a few feet in the hallway.    Inpatient Medications    Scheduled Meds:  allopurinol   300 mg Oral Daily   apixaban   2.5 mg Oral BID   carvedilol   12.5 mg Oral BID WC   citalopram   10 mg Oral q AM   dapagliflozin  propanediol  10 mg Oral Daily   ferrous sulfate   324 mg Oral Q breakfast   finasteride   5 mg Oral Daily   fluticasone  furoate-vilanterol  1 puff Inhalation Daily   furosemide   80 mg Intravenous TID   guaiFENesin   1,200 mg Oral BID   insulin  aspart  0-9 Units Subcutaneous TID WC   insulin  aspart  3 Units Subcutaneous TID WC   insulin  glargine-yfgn  15 Units Subcutaneous Daily   pantoprazole   40 mg Oral BID AC   polyethylene glycol  17 g Oral BID   potassium chloride   20 mEq Oral Daily   rosuvastatin   20 mg Oral Daily   senna-docusate  1 tablet Oral BID   sodium chloride  flush  3 mL Intravenous Q12H   traZODone   100 mg Oral QHS   umeclidinium bromide   1 puff Inhalation Daily   Continuous Infusions:    PRN Meds: acetaminophen  **OR** acetaminophen , albuterol , bisacodyl , fluticasone , loratadine , ondansetron  **OR** ondansetron  (ZOFRAN ) IV, sodium chloride  flush   Vital Signs    Vitals:   08/06/24 0023 08/06/24 0455 08/06/24 0733 08/06/24 0747  BP: 134/78 (!) 145/52  (!) 155/69  Pulse:      Resp: 18 18  15   Temp: 97.7 F (36.5 C) 97.6 F (36.4 C)  97.7 F (36.5 C)  TempSrc: Oral Oral  Oral  SpO2: 99% 96% 96% 96%  Weight:  100 kg    Height:        Intake/Output Summary (Last 24 hours) at 08/06/2024 0759 Last data filed at 08/06/2024 0700 Gross per 24 hour  Intake 1137 ml  Output 5275 ml  Net -4138 ml   Filed Weights   08/04/24 0355 08/05/24 0611 08/06/24 0455  Weight: 101.6 kg 101.6 kg 100 kg     Telemetry    NSR   - Personally Reviewed  ECG     NA - Personally Reviewed  Physical Exam   GEN: No  acute distress.   Neck: No  JVD Cardiac: RRR, no murmurs, rubs, or gallops.  Respiratory:    Decreased breath sounds without wheezing and with mild basilar crackles GI: Soft, nontender, non-distended, normal bowel sounds  MS:  Moderate leg edema; No deformity. Neuro:   Nonfocal  Psych: Oriented and appropriate     Labs    Chemistry Recent Labs  Lab 08/02/24 0224 08/03/24 1337 08/04/24 0307 08/05/24 0924 08/06/24 0327  NA 138 136 137 135 137  K 4.0 4.7 4.6 4.5 3.6  CL 97* 97* 96* 97* 91*  CO2 31 29 33* 27 34*  GLUCOSE 144* 211* 153* 387* 104*  BUN 43* 42* 46* 46* 45*  CREATININE 1.71* 1.86* 1.77* 1.60* 1.46*  CALCIUM  8.8* 8.7* 8.8* 8.6* 9.1  PROT 5.8* 6.0* 5.8*  --   --   ALBUMIN  3.0* 3.1* 3.0*  --   --   AST 19 14* 13*  --   --   ALT 22  21 19  --   --   ALKPHOS 60 56 56  --   --   BILITOT 1.0 0.5 0.6  --   --   GFRNONAA 39* 36* 38* 43* 48*  ANIONGAP 10 10 8 11 12      Hematology Recent Labs  Lab 08/02/24 0224 08/03/24 1337 08/04/24 0307  WBC 7.7 6.5 7.3  RBC 4.03* 4.14* 3.83*  3.83*  HGB 12.1* 12.3* 11.6*  HCT 39.0 40.1 37.2*  MCV 96.8 96.9 97.1  MCH 30.0 29.7 30.3  MCHC 31.0 30.7 31.2  RDW 14.4 14.5 14.4  PLT 188 169 188    Cardiac EnzymesNo results for input(s): TROPONINI in the last 168 hours. No results for input(s): TROPIPOC in the last 168 hours.   BNP Recent Labs  Lab 07/30/24 1237 08/01/24 0232  BNP 548.0* 588.3*     DDimer No results for input(s): DDIMER in the last 168 hours.   Radiology    No results found.   Cardiac Studies   Echo:  1. Technically difficult study, even with the use of echo contrast. Afib  with mildly elevated rates as well limit interpretation LVEF appears to be  mildly reduced at 40%. . Left ventricular ejection fraction, by  estimation, is 40%. The left ventricle  has mildly decreased  function. The left ventricle demonstrates regional  wall motion abnormalities (see scoring diagram/findings for description).  Left ventricular diastolic parameters are indeterminate.   2. RV not well visualized, grossly appears normal in size with low normal  function. . Right ventricular systolic function was not well visualized.  The right ventricular size is not well visualized.   3. Left atrial size was moderately dilated.   4. Right atrial size was moderately dilated.   5. The mitral valve is abnormal. Mild mitral valve regurgitation. No  evidence of mitral stenosis.   6. The tricuspid valve is abnormal.   7. The aortic valve was not well visualized. Aortic valve regurgitation  is not visualized. No aortic stenosis is present.   8. The inferior vena cava is normal in size with greater than 50%  respiratory variability, suggesting right atrial pressure of 3 mmHg.   Patient Profile     83 y.o. male with a past medical history significant for [place] to heart failure with preserved ejection fraction, chronic atrial fibrillation, CAD, COPD on home 2 L of oxygen , CKD stage IIIb and other comorbidities who presents with weight gain, dyspnea and 10 pound increase in his weight. Called and it was recommended him come to the ED for further evaluation. Blood work was done and shows that he is acute on chronic CHF and persistent A-fib.   Assessment & Plan    Acute on chronic HFmrEF:    Intake and output net negative 11,874 cc.  Creat continues to trend down.   Given Zaroxolyn  yesterday.   I think he can get another dose of this today.  Otherwise continue IV diuretic until he is ready to go from a primary service standpoint.   Paroxysmal atrial fibrillation/flutter:    DCCV .  NSR.  Looks like there might have been a paroxysm last evening.    Coronary artery disease :  No evidence of active ischemia.  Continue medical management.   Hyperlipidemia :  Continue Crestor .   Diabetes :  A1C 6.7.   Per primary team.   CKD stage 3b:   Creatinine is down from 1.86 at admission.   For questions or updates, please contact  [facility] Please consult [email or web address] for contact info under Cardiology/STEMI.   Signed, Lynwood Schilling, MD  08/06/2024, 7:59 AM

## 2024-08-06 NOTE — Progress Notes (Signed)
 SATURATION QUALIFICATIONS: (This note is used to comply with regulatory documentation for home oxygen )  Patient Saturations on Room Air at Rest = 90%  Patient Saturations on Room Air while Ambulating = 86%  Patient Saturations on 3 Liters of oxygen  while Ambulating = 93%  Please briefly explain why patient needs home oxygen : Pt hypoxic on RA with standing activity.

## 2024-08-06 NOTE — Progress Notes (Signed)
 Physical Therapy Treatment Patient Details Name: [name]. MRN: [id number] DOB: December 27, 1940 Today's Date: 08/06/2024   History of Present Illness 83 y.o male admitted 07/30/24 for Afib and volume overload. s/p Cardioversion 08/03/24.  PMH: Recent admission 7/2 for AKI and R foot sprain. COPD on 2L O2, T2DM, CAD, PAF on Eliquis , CKD, CVA with left-sided weakness, chronic HFpEF, Lap-Band, gout, HLD, PVD, skin CA    PT Comments  Pt received in supine, agreeable to therapy session with encouragement. Pt with improved standing activity tolerance, requiring 3L/min O2 [place] to maintain SpO2 >88% with gait trial. Pt with slightly improved gait distance this date with RW support, needing up to CGA for safety as pt tending to abandon RW when turning to toilet. Pt up on toilet and not ready when PTA checked after 5 mins, pt A&O agreeable to pull toilet call bell for assist once finished with BM, RN also notified. Pt continues to benefit from PT services to progress toward functional mobility goals.     If plan is discharge home, recommend the following: A little help with walking and/or transfers;A little help with bathing/dressing/bathroom;Assistance with cooking/housework;Assist for transportation   Can travel by private vehicle        Equipment Recommendations  None recommended by PT    Recommendations for Other Services       Precautions / Restrictions Precautions Precautions: Fall Recall of Precautions/Restrictions: Intact Precaution/Restrictions Comments: Oxygen --wears 2 liters at home Restrictions Weight Bearing Restrictions Per Provider Order: No     Mobility  Bed Mobility Overal bed mobility: Modified Independent Bed Mobility: Supine to Sit     Supine to sit: Modified independent (Device/Increase time), HOB elevated, Used rails     General bed mobility comments: Use of bed rail to sit up with increased time    Transfers Overall transfer level: Needs assistance Equipment  used: Rolling walker (2 wheels) Transfers: Sit to/from Stand Sit to Stand: Supervision           General transfer comment: EOB>RW and RW>toilet    Ambulation/Gait Ambulation/Gait assistance: Supervision, Contact guard assist Gait Distance (Feet): 115 Feet Assistive device: Rolling walker (2 wheels) Gait Pattern/deviations: Step-through pattern, Decreased stride length Gait velocity: variable, all grossly <0.3 m/s     General Gait Details: Cues for walker management and proximity. Multiple brief standing breaks for pursed-lip breathing. Pt more dyspneic returning to room and requesting to rest in bathroom due to possible need for BM. See amb sats note. Pt needs CGA only when letting go of RW while turning and reaching for door handle and/or sink counter.   Stairs             Wheelchair Mobility     Tilt Bed    Modified Rankin (Stroke Patients Only)       Balance Overall balance assessment: Needs assistance Sitting-balance support: Feet supported, No upper extremity supported Sitting balance-Leahy Scale: Good     Standing balance support: During functional activity, Reliant on assistive device for balance, Single extremity supported, Bilateral upper extremity supported Standing balance-Leahy Scale: Poor Standing balance comment: Reliant on RW for balance                            Communication Communication Communication: No apparent difficulties  Cognition Arousal: Alert Behavior During Therapy: WFL for tasks assessed/performed   PT - Cognitive impairments: No apparent impairments  Following commands: Intact      Cueing Cueing Techniques: Verbal cues  Exercises      General Comments General comments (skin integrity, edema, etc.): SpO2 86% on RA with amb, needed 3L/min to maintain SpO2 >88%      Pertinent Vitals/Pain Pain Assessment Pain Assessment: No/denies pain    Home Living                           Prior Function            PT Goals (current goals can now be found in the care plan section) Acute Rehab PT Goals Patient Stated Goal: To return home PT Goal Formulation: With patient Time For Goal Achievement: 08/14/24 Progress towards PT goals: Progressing toward goals    Frequency    Min 3X/week      PT Plan      Co-evaluation              AM-PAC PT 6 Clicks Mobility   Outcome Measure  Help needed turning from your back to your side while in a flat bed without using bedrails?: None Help needed moving from lying on your back to sitting on the side of a flat bed without using bedrails?: A Little Help needed moving to and from a bed to a chair (including a wheelchair)?: A Little Help needed standing up from a chair using your arms (e.g., wheelchair or bedside chair)?: A Little Help needed to walk in hospital room?: A Little Help needed climbing 3-5 steps with a railing? : A Lot 6 Click Score: 18    End of Session Equipment Utilized During Treatment: Gait belt;Oxygen  Activity Tolerance: Patient tolerated treatment well Patient left: with call bell/phone within reach;Other (comment) (pt on toilet) Nurse Communication: Mobility status;Other (comment) (amb sats, pt on toilet) PT Visit Diagnosis: Unsteadiness on feet (R26.81);Muscle weakness (generalized) (M62.81)     Time: 8444-8391 PT Time Calculation (min) (ACUTE ONLY): 13 min  Charges:    $Gait Training: 8-22 mins PT General Charges $$ ACUTE PT VISIT: 1 Visit                     Alaija Ruble P., PTA Acute Rehabilitation Services Secure Chat Preferred 9a-5:30pm Office: [phone]    Connell HERO [facility] 08/06/2024, 4:24 PM

## 2024-08-07 ENCOUNTER — Other Ambulatory Visit (HOSPITAL_COMMUNITY): Payer: Self-pay

## 2024-08-07 DIAGNOSIS — I509 Heart failure, unspecified: Secondary | ICD-10-CM

## 2024-08-07 LAB — BASIC METABOLIC PANEL WITH GFR
Anion gap: 12 (ref 5–15)
BUN: 42 mg/dL — ABNORMAL HIGH (ref 8–23)
CO2: 40 mmol/L — ABNORMAL HIGH (ref 22–32)
Calcium: 9.7 mg/dL (ref 8.9–10.3)
Chloride: 86 mmol/L — ABNORMAL LOW (ref 98–111)
Creatinine, Ser: 1.75 mg/dL — ABNORMAL HIGH (ref 0.61–1.24)
GFR, Estimated: 38 mL/min — ABNORMAL LOW (ref >60)
Glucose, Bld: 130 mg/dL — ABNORMAL HIGH (ref 70–99)
Potassium: 3.7 mmol/L (ref 3.5–5.1)
Sodium: 138 mmol/L (ref 135–145)

## 2024-08-07 LAB — GLUCOSE, CAPILLARY
Glucose-Capillary: 129 mg/dL — ABNORMAL HIGH (ref 70–99)
Glucose-Capillary: 205 mg/dL — ABNORMAL HIGH (ref 70–99)

## 2024-08-07 MED ORDER — TORSEMIDE 20 MG PO TABS
40.0000 mg | ORAL_TABLET | Freq: Two times a day (BID) | ORAL | Status: DC
  Administered 2024-08-07: 40 mg via ORAL
  Filled 2024-08-07: qty 2

## 2024-08-07 MED ORDER — FUROSEMIDE 40 MG PO TABS: 80.0000 mg | ORAL_TABLET | Freq: Two times a day (BID) | ORAL | Status: DC

## 2024-08-07 MED ORDER — TORSEMIDE 20 MG PO TABS
40.0000 mg | ORAL_TABLET | Freq: Two times a day (BID) | ORAL | 1 refills | Status: AC
  Filled 2024-08-07: qty 80, 20d supply, fill #0

## 2024-08-07 MED ORDER — POTASSIUM CHLORIDE CRYS ER 10 MEQ PO TBCR
20.0000 meq | EXTENDED_RELEASE_TABLET | Freq: Two times a day (BID) | ORAL | 1 refills | Status: DC
  Filled 2024-08-07: qty 60, 15d supply, fill #0

## 2024-08-07 NOTE — Discharge Summary (Signed)
 Physician Discharge Summary  [name]. 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 L home O2 CKD 3 CAD Obesity Type 2 diabetes mellitus Iron  deficiency anemia BPH   Discharge Condition: Improved  Diet recommendation: Low-sodium, heart healthy, diabetic  Filed Weights   08/05/24 0611 08/06/24 0455 08/07/24 0625  Weight: 101.6 kg 100 kg 96.4 kg    History of present illness:  83/M w chronic diastolic CHF, paroxysmal atrial fibrillation, CAD, COPD on home 2 L of oxygen , CKD stage IIIb and other comorbidities who presents with weight gain, dyspnea and 15 pound weight gain, called cards clinic, recommended to come to the ER, workup in the ED noted fluid overload and persistent A-fib. - Cards following, improving with diuresis, cardioverted 8/11  Hospital Course:   Acute on Chronic HFpEF: Presents with progressively worsening volume overload and increased weight gain.   -Despite compliance with diet, diuretics etc. -Echo 8/7 with EF 40%, wall motion abnormality, mildly reduced RV - Improved slowly with diuresis, 14 L negative, weight down to 212 LB at discharge today -Transitioned to oral torsemide  40 mg twice daily, continue Farxiga  - Follow-up in [facility] on Monday 8/18   Paroxysmal A-fib with RVR -sp cardioversion 8/11 -Currently in sinus rhythm, continue carvedilol  and apixaban    Essential HTN: Stable, diuretics and Coreg  as above   CAD s/p PCI/CTO 2014.:  - Continue statin/beta-blocker, Eliquis    DM-2: Most recent HbA1c was 6.7.  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 Remains on oxygen -2 L at baseline.   Constipation:  - Improved with laxatives   Debility/deconditioning: PT/OT eval recommending Home Health when stable for D/C   GERD/GI Prophylaxis: C/w PPI w/ Pantoprazole  40 mg BID   Hypoalbuminemia:    Class II Obesity: Complicates overall prognosis and care. Estimated body mass index is 39.68 kg/m as calculated from the following:   Height as of this encounter: 5' 3 (1.6 m).   Weight as of this encounter: 101.6 kg. Weight Loss and Dietary Counseling given   Discharge Exam: Vitals:   08/07/24 0715 08/07/24 1127  BP: (!) 141/51 138/63  Pulse: 81 80  Resp: 20 20  Temp: 97.7 F (36.5 C) 98.5 F (36.9 C)  SpO2: 92% 95%   General exam: Appears calm and comfortable, obese pleasant, no distress ENT: no JVD Respiratory system: Decreased breath sounds at the bases Cardiovascular system: S1 & S2 heard, RRR.  Abd: nondistended, soft and nontender.Normal bowel sounds heard. Central nervous system: Alert and oriented. No focal neurological deficits. Extremities: trace  edema Skin: No rashes Psychiatry:  Mood & affect appropriate.   Discharge Instructions   Discharge Instructions     Diet - low sodium heart healthy   Complete by: As directed    Increase activity slowly   Complete by: As directed  Allergies as of 08/07/2024       Reactions   Enalapril Maleate Cough   Lisinopril Cough   Shellfish-derived Products Swelling, Other (See Comments)   Said reaction occurred twice; has eaten some since and had no reactions   Iodine-kelp [iodine] Other (See Comments)   Allergic, per the patient        Medication List     STOP taking these medications    isosorbide  mononitrate 60 MG  24 hr tablet Commonly known as: IMDUR        TAKE these medications    acetaminophen  500 MG tablet Commonly known as: TYLENOL  Take 500-1,000 mg by mouth every 6 (six) hours as needed for moderate pain (pain score 4-6).   albuterol  108 (90 Base) MCG/ACT inhaler Commonly known as: VENTOLIN  HFA Inhale 2 puffs into the lungs every 6 (six) hours as needed for wheezing or shortness of breath.   allopurinol  300 MG tablet Commonly known as: ZYLOPRIM  TAKE 1 TABLET BY MOUTH DAILY   apixaban  2.5 MG Tabs tablet Commonly known as: ELIQUIS  Take 1 tablet (2.5 mg total) by mouth 2 (two) times daily.   Azelastine  HCl 137 MCG/SPRAY Soln Place 2 puffs into the nose every 12 (twelve) hours as needed (Nasal congestion). What changed:  how to take this reasons to take this   calcitRIOL  0.25 MCG capsule Commonly known as: ROCALTROL  TAKE 1 CAPSULE BY MOUTH DAILY   carvedilol  25 MG tablet Commonly known as: COREG  TAKE 1 TABLET BY MOUTH TWICE  DAILY WITH MEALS   cetirizine 10 MG tablet Commonly known as: ZYRTEC Take 10 mg by mouth daily as needed for allergies or rhinitis.   cholecalciferol  25 MCG (1000 UNIT) tablet Commonly known as: VITAMIN D3 Take 1,000 Units by mouth daily.   citalopram  10 MG tablet Commonly known as: CELEXA  TAKE 1 TABLET BY MOUTH DAILY What changed:  how much to take how to take this when to take this additional instructions   Farxiga  10 MG Tabs tablet Generic drug: dapagliflozin  propanediol TAKE 1 TABLET BY MOUTH DAILY.   ferrous sulfate  324 MG Tbec Take 324 mg by mouth daily with breakfast.   finasteride  5 MG tablet Commonly known as: PROSCAR  TAKE 1 TABLET(5 MG) BY MOUTH DAILY   fluticasone  50 MCG/ACT nasal spray Commonly known as: FLONASE  Place 2 sprays into both nostrils daily. What changed:  when to take this reasons to take this   guaiFENesin  600 MG 12 hr tablet Commonly known as: MUCINEX  Take 2 tablets (1,200 mg total) by mouth 2 (two)  times daily. What changed:  how much to take when to take this   hydrocortisone  2.5 % cream Apply 1 application  topically daily as needed (Irritation).   insulin  aspart 100 UNIT/ML injection Commonly known as: novoLOG  Inject 7-9 Units into the skin 3 (three) times daily before meals.   ketoconazole 2 % cream Commonly known as: NIZORAL Apply 1 application  topically daily as needed for irritation (affected sites).   MULTIVITAMIN PO Take 1 tablet by mouth daily with breakfast.   nystatin cream Commonly known as: MYCOSTATIN Apply 1 application  topically 2 (two) times daily as needed for dry skin (or irritation).   OXYGEN  Inhale 2 L/min into the lungs continuous.   pantoprazole  40 MG tablet Commonly known as: PROTONIX  Take 1 tablet (40 mg total) by mouth 2 (two) times daily before a meal.   polyethylene glycol powder 17 GM/SCOOP powder Commonly known as: GLYCOLAX /MIRALAX  Take 17 g by mouth daily as  needed (constipation).   potassium chloride  10 MEQ tablet Commonly known as: KLOR-CON  Take 2 tablets (20 mEq total) by mouth 2 (two) times daily. What changed: when to take this   rosuvastatin  20 MG tablet Commonly known as: CRESTOR  Take 1 tablet (20 mg total) by mouth daily.   Spiriva  Respimat 2.5 MCG/ACT Aers Generic drug: Tiotropium Bromide  Monohydrate Inhale 1 puff into the lungs daily.   torsemide  20 MG tablet Commonly known as: DEMADEX  Take 2 tablets (40 mg total) by mouth 2 (two) times daily. What changed:  how much to take when to take this   traZODone  100 MG tablet Commonly known as: DESYREL  TAKE 1 TABLET BY MOUTH AT  BEDTIME   Trulicity  4.5 MG/0.5ML Soaj Generic drug: Dulaglutide  Inject 4.5 mg as directed once a week. What changed: when to take this   UNJURY UNFLAVORED PO Take 8-16 oz by mouth See admin instructions.  Mix 8-16 ounces into a recommended beverage and drink by mouth once a day   VITAMIN B COMPLEX  PO Take 1 tablet by mouth daily.    Wixela Inhub 250-50 MCG/ACT Aepb Generic drug: fluticasone -salmeterol Inhale 1 puff into the lungs in the morning and at bedtime.       Allergies  Allergen Reactions   Enalapril Maleate Cough   Lisinopril Cough   Shellfish-Derived Products Swelling and Other (See Comments)    Said reaction occurred twice; has eaten some since and had no reactions   Iodine-Kelp [Iodine] Other (See Comments)    Allergic, per the patient    Follow-up Information     [facility] Follow up.   Specialty: Home Health Services Why: Hedda will contact you for the next home visit. Contact information: [address] [place] [address] [phone]         [facility]. Go in 5 day(s).   Specialty: Cardiology Why: Hospital follow up 08/10/2024 @ 9:45 am PLEASE bring a current medication list to appointment FREE valet parking, Entrance C, off [employer]  Look for [facility] Entrance to turn into. Contact information: [address] [place]  [address] [phone]        Verlin Lonni BIRCH, MD Follow up on 08/12/2024.   Specialty: Cardiology Why: 2:40PM. Cardiology follow up Contact information: [address] [place] [address] [phone]                  The results of significant diagnostics from this hospitalization (including imaging, microbiology, ancillary and laboratory) are listed below for reference.    Significant Diagnostic Studies: EP STUDY Result Date: 08/03/2024 See surgical note for result.  DG CHEST PORT 1 VIEW Result Date: 08/02/2024 CLINICAL DATA:  Shortness of breath. EXAM: PORTABLE CHEST 1 VIEW COMPARISON:  08/01/2024 FINDINGS: The cardio pericardial silhouette is enlarged. Vascular congestion again noted without overt airspace pulmonary edema. There is some atelectasis or infiltrate in both lung bases, similar to prior with probable  small bilateral pleural effusions. Telemetry leads overlie the chest. IMPRESSION: 1. Enlargement of the cardiopericardial silhouette with vascular congestion. 2. Bibasilar atelectasis or infiltrate with probable small bilateral pleural effusions. Electronically Signed   By: Camellia Candle M.D.   On: 08/02/2024 08:58   DG CHEST PORT 1 VIEW Result Date: 08/01/2024 CLINICAL DATA:  Shortness of breath. EXAM: PORTABLE CHEST 1 VIEW COMPARISON:  07/30/2024 FINDINGS: Cardiomegaly is stable. Increasing hazy opacity at the lung bases likely increasing pleural effusions. Worsening vascular congestion.  No pneumothorax or confluent airspace disease. IMPRESSION: Cardiomegaly with worsening vascular congestion and increasing pleural effusions. Electronically Signed   By: Andrea Gasman M.D.   On: 08/01/2024 10:56   ECHOCARDIOGRAM COMPLETE Result Date: 08/01/2024    ECHOCARDIOGRAM REPORT   Patient Name:   Raziel Koenigs. Date of Exam: 07/30/2024 Medical Rec #:  [id number]         Height:       63.0 in Accession #:    [id number]        Weight:       222.2 lb Date of Birth:  [birth year]         BSA:          2.022 m Patient Age:    83 years          BP:           124/73 mmHg Patient Gender: M                 HR:           77 bpm. Exam Location:  Inpatient Procedure: 2D Echo, Cardiac Doppler, Color Doppler and Intracardiac            Opacification Agent (Both Spectral and Color Flow Doppler were            utilized during procedure). Indications:    CHF- Acute Diastolic I50.31  History:        Patient has prior history of Echocardiogram examinations, most                 recent 11/08/2021. CHF, CAD, COPD, Stroke and CKD, stage 3,                 Arrythmias:Atrial Fibrillation; Risk Factors:Diabetes and                 Dyslipidemia.  Sonographer:    Thea Norlander RCS Referring Phys: [id number] DONALDA M GHIMIRE IMPRESSIONS  1. Technically difficult study, even with the use of echo contrast. Afib with mildly elevated rates as well limit  interpretation LVEF appears to be mildly reduced at 40%. . Left ventricular ejection fraction, by estimation, is 40%. The left ventricle has mildly decreased function. The left ventricle demonstrates regional wall motion abnormalities (see scoring diagram/findings for description). Left ventricular diastolic parameters are indeterminate.  2. RV not well visualized, grossly appears normal in size with low normal function. . Right ventricular systolic function was not well visualized. The right ventricular size is not well visualized.  3. Left atrial size was moderately dilated.  4. Right atrial size was moderately dilated.  5. The mitral valve is abnormal. Mild mitral valve regurgitation. No evidence of mitral stenosis.  6. The tricuspid valve is abnormal.  7. The aortic valve was not well visualized. Aortic valve regurgitation is not visualized. No aortic stenosis is present.  8. The inferior vena cava is normal in size with greater than 50% respiratory variability, suggesting right atrial pressure of 3 mmHg. FINDINGS  Left Ventricle: Technically difficult study, even with the use of echo contrast. Afib with mildly elevated rates as well limit interpretation LVEF appears to be mildly reduced at 40%. Left ventricular ejection fraction, by estimation, is 40%. The left ventricle has mildly decreased function. The left ventricle demonstrates regional wall motion abnormalities. Definity  contrast agent was given IV to delineate the left ventricular endocardial borders. The left ventricular internal cavity size was normal in size. There is no left ventricular hypertrophy. Left ventricular  diastolic parameters are indeterminate.  LV Wall Scoring: The entire anterior septum, apical anterior segment, and apex are hypokinetic. Right Ventricle: RV not well visualized, grossly appears normal in size with low normal function. The right ventricular size is not well visualized. Right vetricular wall thickness was not well  visualized. Right ventricular systolic function was not well  visualized. Left Atrium: Left atrial size was moderately dilated. Right Atrium: Right atrial size was moderately dilated. Pericardium: There is no evidence of pericardial effusion. Mitral Valve: The mitral valve is abnormal. There is mild thickening of the mitral valve leaflet(s). There is mild calcification of the mitral valve leaflet(s). Mild mitral annular calcification. Mild mitral valve regurgitation. No evidence of mitral valve stenosis. Tricuspid Valve: The tricuspid valve is abnormal. Tricuspid valve regurgitation is mild . No evidence of tricuspid stenosis. Aortic Valve: The aortic valve was not well visualized. Aortic valve regurgitation is not visualized. No aortic stenosis is present. Aortic valve mean gradient measures 3.1 mmHg. Aortic valve peak gradient measures 5.7 mmHg. Aortic valve area, by VTI measures 1.79 cm. Pulmonic Valve: The pulmonic valve was not well visualized. Pulmonic valve regurgitation is not visualized. No evidence of pulmonic stenosis. Aorta: The aortic root and ascending aorta are structurally normal, with no evidence of dilitation. Venous: The inferior vena cava is normal in size with greater than 50% respiratory variability, suggesting right atrial pressure of 3 mmHg. IAS/Shunts: No atrial level shunt detected by color flow Doppler.  LEFT VENTRICLE PLAX 2D LVIDd:         4.90 cm      Diastology LVIDs:         3.40 cm      LV e' medial:   8.03 cm/s LV PW:         1.10 cm      LV E/e' medial: 18.1 LV IVS:        0.70 cm LVOT diam:     2.00 cm LV SV:         53 LV SV Index:   26 LVOT Area:     3.14 cm  LV Volumes (MOD) LV vol d, MOD A2C: 88.2 ml LV vol d, MOD A4C: 103.0 ml LV vol s, MOD A2C: 52.3 ml LV vol s, MOD A4C: 48.0 ml LV SV MOD A2C:     35.9 ml LV SV MOD A4C:     103.0 ml LV SV MOD BP:      50.7 ml RIGHT VENTRICLE RV S prime:     7.61 cm/s TAPSE (M-mode): 1.3 cm LEFT ATRIUM           Index        RIGHT ATRIUM            Index LA diam:      5.30 cm 2.62 cm/m   RA Area:     27.20 cm LA Vol (A4C): 92.8 ml 45.89 ml/m  RA Volume:   90.80 ml  44.90 ml/m  AORTIC VALVE AV Area (Vmax):    2.32 cm AV Area (Vmean):   2.18 cm AV Area (VTI):     1.79 cm AV Vmax:           119.00 cm/s AV Vmean:          83.693 cm/s AV VTI:            0.295 m AV Peak Grad:      5.7 mmHg AV Mean Grad:      3.1 mmHg LVOT  Vmax:         88.00 cm/s LVOT Vmean:        58.100 cm/s LVOT VTI:          0.168 m LVOT/AV VTI ratio: 0.57  AORTA Ao Root diam: 3.30 cm Ao Asc diam:  3.20 cm MITRAL VALVE MV Area (PHT): 5.38 cm     SHUNTS MV Decel Time: 141 msec     Systemic VTI:  0.17 m MV E velocity: 145.00 cm/s  Systemic Diam: 2.00 cm Dorn Ross MD Electronically signed by Dorn Ross MD Signature Date/Time: 08/01/2024/8:23:56 AM    Final    DG Chest Portable 1 View Result Date: 07/30/2024 CLINICAL DATA:  Shortness of breath. EXAM: PORTABLE CHEST 1 VIEW COMPARISON:  June 30, 2024. FINDINGS: Stable cardiomegaly with mild central pulmonary vascular congestion. Minimal bibasilar subsegmental atelectasis or edema is noted. Bony thorax is unremarkable. IMPRESSION: Stable cardiomegaly with mild central pulmonary vascular congestion. Minimal bibasilar subsegmental atelectasis or edema. Electronically Signed   By: Lynwood Landy [name] M.D.   On: 07/30/2024 13:21    Microbiology: No results found for this or any previous visit (from the past 240 hours).   Labs: Basic Metabolic Panel: Recent Labs  Lab 08/01/24 0232 08/02/24 0224 08/03/24 1337 08/04/24 0307 08/05/24 0924 08/06/24 0327 08/07/24 0218  NA 138 138 136 137 135 137 138  K 3.6 4.0 4.7 4.6 4.5 3.6 3.7  CL 98 97* 97* 96* 97* 91* 86*  CO2 31 31 29  33* 27 34* 40*  GLUCOSE 136* 144* 211* 153* 387* 104* 130*  BUN 43* 43* 42* 46* 46* 45* 42*  CREATININE 1.63* 1.71* 1.86* 1.77* 1.60* 1.46* 1.75*  CALCIUM  8.8* 8.8* 8.7* 8.8* 8.6* 9.1 9.7  MG 2.3 2.2 2.3 2.3  --   --   --   PHOS 3.5 3.4 3.4 3.5   --   --   --    Liver Function Tests: Recent Labs  Lab 08/01/24 0232 08/02/24 0224 08/03/24 1337 08/04/24 0307  AST 11* 19 14* 13*  ALT 15 22 21 19   ALKPHOS 52 60 56 56  BILITOT 0.8 1.0 0.5 0.6  PROT 5.8* 5.8* 6.0* 5.8*  ALBUMIN  3.1* 3.0* 3.1* 3.0*   No results for input(s): LIPASE, AMYLASE in the last 168 hours. No results for input(s): AMMONIA in the last 168 hours. CBC: Recent Labs  Lab 08/01/24 0232 08/02/24 0224 08/03/24 1337 08/04/24 0307  WBC 6.8 7.7 6.5 7.3  NEUTROABS 4.7 5.5 5.0 5.3  HGB 12.3* 12.1* 12.3* 11.6*  HCT 38.8* 39.0 40.1 37.2*  MCV 96.0 96.8 96.9 97.1  PLT 186 188 169 188   Cardiac Enzymes: No results for input(s): CKTOTAL, CKMB, CKMBINDEX, TROPONINI in the last 168 hours. BNP: BNP (last 3 results) Recent Labs    07/01/24 0450 07/30/24 1237 08/01/24 0232  BNP 1,008.1* 548.0* 588.3*    ProBNP (last 3 results) Recent Labs    06/18/24 0913  PROBNP 151.0*    CBG: Recent Labs  Lab 08/06/24 0745 08/06/24 1153 08/06/24 1557 08/06/24 2119 08/07/24 0629  GLUCAP 116* 237* 143* 161* 129*       Signed:  Sigurd Pac MD.  [facility] 08/07/2024, 11:30 AM

## 2024-08-07 NOTE — Progress Notes (Signed)
 Pt's nurse Jerrell has reviewed pt's discharge instructions with pt. Pt's son will be bringing the pt's O2 tank from his home and will be picking up pt.   Pt needs his unna boots removed per the pt's nurse before he can be discharged. Pt's nurse will remove them shortly.   [name] Sorn,RN SWOT

## 2024-08-07 NOTE — Progress Notes (Signed)
 Progress Note  Patient Name: [name]. Date of Encounter: 08/07/2024  Primary Cardiologist:   Lonni Cash, MD   Subjective   No chest pain.  Breathing better.     Inpatient Medications    Scheduled Meds:  allopurinol   300 mg Oral Daily   apixaban   2.5 mg Oral BID   carvedilol   12.5 mg Oral BID WC   citalopram   10 mg Oral q AM   dapagliflozin  propanediol  10 mg Oral Daily   ferrous sulfate   324 mg Oral Q breakfast   finasteride   5 mg Oral Daily   fluticasone  furoate-vilanterol  1 puff Inhalation Daily   furosemide   80 mg Intravenous TID   guaiFENesin   1,200 mg Oral BID   insulin  aspart  0-9 Units Subcutaneous TID WC   insulin  aspart  3 Units Subcutaneous TID WC   insulin  glargine-yfgn  15 Units Subcutaneous Daily   pantoprazole   40 mg Oral BID AC   polyethylene glycol  17 g Oral BID   potassium chloride   20 mEq Oral Daily   rosuvastatin   20 mg Oral Daily   senna-docusate  1 tablet Oral BID   sodium chloride  flush  3 mL Intravenous Q12H   traZODone   100 mg Oral QHS   umeclidinium bromide   1 puff Inhalation Daily   Continuous Infusions:    PRN Meds: acetaminophen  **OR** acetaminophen , albuterol , bisacodyl , fluticasone , loratadine , ondansetron  **OR** ondansetron  (ZOFRAN ) IV, sodium chloride  flush   Vital Signs    Vitals:   08/06/24 1930 08/06/24 2354 08/07/24 0625 08/07/24 0715  BP: (!) 121/58 126/60 (!) 137/48 (!) 141/51  Pulse: 78 81 80 81  Resp: 19 18 19 20   Temp: 98.5 F (36.9 C) 97.6 F (36.4 C) 97.8 F (36.6 C) 97.7 F (36.5 C)  TempSrc: Oral Oral Oral Oral  SpO2: 95% 96% 99% 92%  Weight:   96.4 kg   Height:        Intake/Output Summary (Last 24 hours) at 08/07/2024 0819 Last data filed at 08/07/2024 0629 Gross per 24 hour  Intake 483 ml  Output 2450 ml  Net -1967 ml   Filed Weights   08/05/24 0611 08/06/24 0455 08/07/24 0625  Weight: 101.6 kg 100 kg 96.4 kg    Telemetry    NSR   - Personally Reviewed  ECG    NA -  Personally Reviewed  Physical Exam   GEN: No  acute distress.   Neck: No  JVD Cardiac: RRR, 2/6 systolic murmur, no diastolic murmurs, rubs, or gallops.  Respiratory: Clear   to auscultation bilaterally. GI: Soft, nontender, non-distended, normal bowel sounds  MS:  Mild improved leg edema; No deformity. Neuro:   Nonfocal  Psych: Oriented and appropriate    Labs    Chemistry Recent Labs  Lab 08/02/24 0224 08/03/24 1337 08/04/24 0307 08/05/24 0924 08/06/24 0327 08/07/24 0218  NA 138 136 137 135 137 138  K 4.0 4.7 4.6 4.5 3.6 3.7  CL 97* 97* 96* 97* 91* 86*  CO2 31 29 33* 27 34* 40*  GLUCOSE 144* 211* 153* 387* 104* 130*  BUN 43* 42* 46* 46* 45* 42*  CREATININE 1.71* 1.86* 1.77* 1.60* 1.46* 1.75*  CALCIUM  8.8* 8.7* 8.8* 8.6* 9.1 9.7  PROT 5.8* 6.0* 5.8*  --   --   --   ALBUMIN  3.0* 3.1* 3.0*  --   --   --   AST 19 14* 13*  --   --   --  ALT 22 21 19   --   --   --   ALKPHOS 60 56 56  --   --   --   BILITOT 1.0 0.5 0.6  --   --   --   GFRNONAA 39* 36* 38* 43* 48* 38*  ANIONGAP 10 10 8 11 12 12      Hematology Recent Labs  Lab 08/02/24 0224 08/03/24 1337 08/04/24 0307  WBC 7.7 6.5 7.3  RBC 4.03* 4.14* 3.83*  3.83*  HGB 12.1* 12.3* 11.6*  HCT 39.0 40.1 37.2*  MCV 96.8 96.9 97.1  MCH 30.0 29.7 30.3  MCHC 31.0 30.7 31.2  RDW 14.4 14.5 14.4  PLT 188 169 188    Cardiac EnzymesNo results for input(s): TROPONINI in the last 168 hours. No results for input(s): TROPIPOC in the last 168 hours.   BNP Recent Labs  Lab 08/01/24 0232  BNP 588.3*     DDimer No results for input(s): DDIMER in the last 168 hours.   Radiology    No results found.   Cardiac Studies   Echo:  1. Technically difficult study, even with the use of echo contrast. Afib  with mildly elevated rates as well limit interpretation LVEF appears to be  mildly reduced at 40%. . Left ventricular ejection fraction, by  estimation, is 40%. The left ventricle  has mildly decreased  function. The left ventricle demonstrates regional  wall motion abnormalities (see scoring diagram/findings for description).  Left ventricular diastolic parameters are indeterminate.   2. RV not well visualized, grossly appears normal in size with low normal  function. . Right ventricular systolic function was not well visualized.  The right ventricular size is not well visualized.   3. Left atrial size was moderately dilated.   4. Right atrial size was moderately dilated.   5. The mitral valve is abnormal. Mild mitral valve regurgitation. No  evidence of mitral stenosis.   6. The tricuspid valve is abnormal.   7. The aortic valve was not well visualized. Aortic valve regurgitation  is not visualized. No aortic stenosis is present.   8. The inferior vena cava is normal in size with greater than 50%  respiratory variability, suggesting right atrial pressure of 3 mmHg.   Patient Profile     83 y.o. male with a past medical history significant for [place] to heart failure with preserved ejection fraction, chronic atrial fibrillation, CAD, COPD on home 2 L of oxygen , CKD stage IIIb and other comorbidities who presents with weight gain, dyspnea and 10 pound increase in his weight. Called and it was recommended him come to the ED for further evaluation. Blood work was done and shows that he is acute on chronic CHF and persistent A-fib.   Assessment & Plan    Acute on chronic HFmrEF:    Intake and output net negative 13,841 cc.  Creat up slightly.  REDS Clip 34%.  Change to PO diuretic today and follow in [facility].   He has Torsemide  at home so will give 40 mg (20 x 2) bid.    Paroxysmal atrial fibrillation/flutter:    DCCV .  NSR.   Continue Eliquis .     Coronary artery disease :  No evidence of active ischemia.   Continue risk reduction.   Hyperlipidemia :  Administrator, sports.   Diabetes :  A1C 6.7. Management per [name] HERO, MD   CKD stage 3b:   Creatinine is down from 1.86 at  admission.   He has  follow up in a few days.    For questions or updates, please contact [facility] Please consult [email or web address] for contact info under Cardiology/STEMI.   Signed, Lynwood Schilling, MD  08/07/2024, 8:19 AM

## 2024-08-07 NOTE — Progress Notes (Signed)
   08/07/24 1034  Spiritual Encounters  Type of Visit Initial  Care provided to: Patient  Referral source Clinical staff  Reason for visit Routine spiritual support  OnCall Visit No  Spiritual Framework  Presenting Themes Meaning/purpose/sources of inspiration;Values and beliefs;Significant life change  Community/Connection Family  Patient Stress Factors Exhausted;Health changes  Family Stress Factors Health changes;Major life changes  Interventions  Spiritual Care Interventions Made Compassionate presence;Established relationship of care and support  Intervention Outcomes  Outcomes Awareness of support;Awareness of health;Connection to spiritual care   Pt seated in chair, appeared mildly distressed. Shared that his daughter is at [facility] and he just learn his wife test positive for COVID-19.  Expressed concern regarding discharge planned for today. Chaplain provided emotional and spiritual support, offered words of comfort, and assured Pt I would check on his daughter. Pt expressed gratitude for visit. Chaplain services remain available as needed.

## 2024-08-07 NOTE — Care Management Important Message (Signed)
 Important Message  Patient Details  Name: [name]. MRN: [id number] Date of Birth: Feb 09, 1941   Important Message Given:  Yes - Medicare IM     Vonzell Arrie Sharps 08/07/2024, 10:44 AM

## 2024-08-07 NOTE — TOC Transition Note (Signed)
 Transition of Care [facility]) - Discharge Note   Patient Details  Name: [name]. MRN: [id number] Date of Birth: 1941-09-20  [facility] of [facility]) CM/SW Contact:  Waddell Barnie Rama, RN Phone Number: 08/07/2024, 10:31 AM   Clinical Narrative:    For possible dc today, NCM notified [place] with [employer] .  He has transportation at [employer].     Barriers to Discharge: Continued Medical Work up   Patient Goals and CMS Choice   CMS Medicare.gov Compare Post Acute Care list provided to:: Patient Choice offered to / list presented to : Patient      Discharge Placement                       Discharge Plan and Services Additional resources added to the After Visit Summary for     Discharge Planning Services: CM Consult Post Acute Care Choice: Home Health                    HH Arranged: PT [facility] Agency: [facility] Date [facility] Agency Contacted: 07/31/24   Representative spoke with at [facility] Agency: Darleene  Social Drivers of Health (SDOH) Interventions SDOH Screenings   Food Insecurity: No Food Insecurity (07/30/2024)  Housing: Low Risk  (07/30/2024)  Transportation Needs: No Transportation Needs (07/31/2024)  Utilities: Not At Risk (07/30/2024)  Alcohol Screen: Low Risk  (07/31/2024)  Depression (PHQ2-9): Low Risk  (07/02/2024)  Recent Concern: Depression (PHQ2-9) - Medium Risk (05/25/2024)  Financial Resource Strain: Low Risk  (07/31/2024)  Physical Activity: Inactive (06/14/2024)  Social Connections: Moderately Isolated (07/30/2024)  Stress: Stress Concern Present (06/14/2024)  Tobacco Use: Medium Risk (08/03/2024)  Health Literacy: Adequate Health Literacy (05/25/2024)     Readmission Risk Interventions    01/15/2024    1:16 PM  Readmission Risk Prevention Plan  Transportation Screening Complete  PCP or Specialist Appt within 5-7 Days Complete  Home Care Screening Complete  Medication Review (RN CM) Complete

## 2024-08-07 NOTE — Progress Notes (Signed)
 Mobility Specialist Progress Note:    08/07/24 0943  Mobility  Activity Ambulated with assistance  Level of Assistance Standby assist, set-up cues, supervision of patient - no hands on  Assistive Device Front wheel walker  Distance Ambulated (ft) 150 ft  Activity Response Tolerated well  Mobility Referral Yes  Mobility visit 1 Mobility  Mobility Specialist Start Time (ACUTE ONLY) [id number]  Mobility Specialist Stop Time (ACUTE ONLY) 1010  Mobility Specialist Time Calculation (min) (ACUTE ONLY) 27 min   Received pt sitting in chair at sink. Agreeable to session. Feeling better after having more fluid drawn off. No c/o any symptoms. Pt ambulated w/ 2L [place]. Returned pt to chair w/ all needs met.   Venetia Keel Mobility Specialist Please Neurosurgeon or Rehab Office at [phone]

## 2024-08-10 ENCOUNTER — Ambulatory Visit (HOSPITAL_COMMUNITY): Payer: Self-pay | Admitting: Internal Medicine

## 2024-08-10 ENCOUNTER — Encounter (HOSPITAL_COMMUNITY): Payer: Self-pay

## 2024-08-10 ENCOUNTER — Inpatient Hospital Stay (HOSPITAL_COMMUNITY): Admit: 2024-08-10 | Discharge: 2024-08-10 | Discharge status: Home / Self Care | Attending: Internal Medicine

## 2024-08-10 ENCOUNTER — Telehealth: Payer: Self-pay

## 2024-08-10 VITALS — BP 128/54 | HR 82 | Ht 63.0 in | Wt 210.0 lb

## 2024-08-10 DIAGNOSIS — Z955 Presence of coronary angioplasty implant and graft: Secondary | ICD-10-CM | POA: Diagnosis not present

## 2024-08-10 DIAGNOSIS — J449 Chronic obstructive pulmonary disease, unspecified: Secondary | ICD-10-CM | POA: Diagnosis not present

## 2024-08-10 DIAGNOSIS — N1832 Chronic kidney disease, stage 3b: Secondary | ICD-10-CM | POA: Diagnosis not present

## 2024-08-10 DIAGNOSIS — I081 Rheumatic disorders of both mitral and tricuspid valves: Secondary | ICD-10-CM | POA: Insufficient documentation

## 2024-08-10 DIAGNOSIS — R0602 Shortness of breath: Secondary | ICD-10-CM | POA: Diagnosis not present

## 2024-08-10 DIAGNOSIS — I255 Ischemic cardiomyopathy: Secondary | ICD-10-CM | POA: Diagnosis not present

## 2024-08-10 DIAGNOSIS — I482 Chronic atrial fibrillation, unspecified: Secondary | ICD-10-CM | POA: Insufficient documentation

## 2024-08-10 DIAGNOSIS — Z7984 Long term (current) use of oral hypoglycemic drugs: Secondary | ICD-10-CM | POA: Diagnosis not present

## 2024-08-10 DIAGNOSIS — Z9981 Dependence on supplemental oxygen: Secondary | ICD-10-CM | POA: Diagnosis not present

## 2024-08-10 DIAGNOSIS — Z8673 Personal history of transient ischemic attack (TIA), and cerebral infarction without residual deficits: Secondary | ICD-10-CM | POA: Diagnosis not present

## 2024-08-10 DIAGNOSIS — I2510 Atherosclerotic heart disease of native coronary artery without angina pectoris: Secondary | ICD-10-CM | POA: Diagnosis not present

## 2024-08-10 DIAGNOSIS — I5032 Chronic diastolic (congestive) heart failure: Secondary | ICD-10-CM

## 2024-08-10 DIAGNOSIS — Z87891 Personal history of nicotine dependence: Secondary | ICD-10-CM | POA: Insufficient documentation

## 2024-08-10 DIAGNOSIS — I130 Hypertensive heart and chronic kidney disease with heart failure and stage 1 through stage 4 chronic kidney disease, or unspecified chronic kidney disease: Secondary | ICD-10-CM | POA: Insufficient documentation

## 2024-08-10 DIAGNOSIS — Z79899 Other long term (current) drug therapy: Secondary | ICD-10-CM | POA: Insufficient documentation

## 2024-08-10 DIAGNOSIS — Z7901 Long term (current) use of anticoagulants: Secondary | ICD-10-CM | POA: Diagnosis not present

## 2024-08-10 DIAGNOSIS — J4489 Other specified chronic obstructive pulmonary disease: Secondary | ICD-10-CM

## 2024-08-10 DIAGNOSIS — I5022 Chronic systolic (congestive) heart failure: Secondary | ICD-10-CM | POA: Diagnosis present

## 2024-08-10 LAB — BASIC METABOLIC PANEL WITH GFR
Anion gap: 14 (ref 5–15)
BUN: 59 mg/dL — ABNORMAL HIGH (ref 8–23)
CO2: 35 mmol/L — ABNORMAL HIGH (ref 22–32)
Calcium: 9.7 mg/dL (ref 8.9–10.3)
Chloride: 86 mmol/L — ABNORMAL LOW (ref 98–111)
Creatinine, Ser: 1.68 mg/dL — ABNORMAL HIGH (ref 0.61–1.24)
GFR, Estimated: 40 mL/min — ABNORMAL LOW (ref >60)
Glucose, Bld: 241 mg/dL — ABNORMAL HIGH (ref 70–99)
Potassium: 4 mmol/L (ref 3.5–5.1)
Sodium: 135 mmol/L (ref 135–145)

## 2024-08-10 MED ORDER — ISOSORBIDE MONONITRATE ER 30 MG PO TB24: 30.0000 mg | ORAL_TABLET | Freq: Every day | ORAL | 2 refills | Status: DC

## 2024-08-10 NOTE — Addendum Note (Signed)
 Encounter addended by: Hayes Beckey CROME, NP on: 08/10/2024 1:46 PM  Actions taken: Clinical Note Signed

## 2024-08-10 NOTE — Transitions of Care (Post Inpatient/ED Visit) (Signed)
 08/10/2024  Name: [name]. MRN: [id number] DOB: 1941/10/26  Today's TOC FU Call Status: Today's TOC FU Call Status:: Successful TOC FU Call Completed TOC FU Call Complete Date: 08/10/24 Patient's Name and Date of Birth confirmed.  Transition Care Management Follow-up Telephone Call Date of Discharge: 08/07/24 Discharge Facility: [facility]) Type of Discharge: Inpatient Admission How have you been since you were released from the hospital?: Better Any questions or concerns?: No  Items Reviewed: Did you receive and understand the discharge instructions provided?: Yes Medications obtained,verified, and reconciled?: Yes (Medications Reviewed) Any new allergies since your discharge?: No Dietary orders reviewed?: Yes Type of Diet Ordered:: Heart Healthy low sodium Do you have support at home?: Yes People in Home [RPT]: spouse Name of Support/Comfort Primary Source: Rudell (grandson takes him to appointments)  Medications Reviewed Today: Medications Reviewed Today     Reviewed by Eilleen Richerd GRADE, RN (Registered Nurse) on 08/10/24 at 1545  Med List Status: <None>   Medication Order Taking? Sig Documenting Provider Last Dose Status Informant  acetaminophen  (TYLENOL ) 500 MG tablet [id number]  Take 500-1,000 mg by mouth every 6 (six) hours as needed for moderate pain (pain score 4-6). [provider]  Active Self  albuterol  (VENTOLIN  HFA) 108 (90 Base) MCG/ACT inhaler [id number]  Inhale 2 puffs into the lungs every 6 (six) hours as needed for wheezing or shortness of breath. [name], Robert S, MD  Active Self  allopurinol  (ZYLOPRIM ) 300 MG tablet [id number]  TAKE 1 TABLET BY MOUTH DAILY [name] HERO, MD  Active Self  apixaban  (ELIQUIS ) 2.5 MG TABS tablet [id number]  Take 1 tablet (2.5 mg total) by mouth 2 (two) times daily. Elgergawy, Brayton RAMAN, MD  Active Self  Azelastine  HCl 137 MCG/SPRAY SOLN [id number]  Place 2 puffs into the nose every 12 (twelve) hours as needed  (Nasal congestion).  Patient taking differently: Place 2 puffs into both nostrils every 12 (twelve) hours as needed (for congestion).   [name], Robert S, MD  Active Self  B Complex Vitamins (VITAMIN B COMPLEX  PO) [id number]  Take 1 tablet by mouth daily. [provider]  Active Self  calcitRIOL  (ROCALTROL ) 0.25 MCG capsule [id number]  TAKE 1 CAPSULE BY MOUTH DAILY [name] HERO, MD  Active Self  carvedilol  (COREG ) 25 MG tablet [id number]  TAKE 1 TABLET BY MOUTH TWICE  DAILY WITH MEALS [name] HERO, MD  Active Self  cetirizine (ZYRTEC) 10 MG tablet [id number]  Take 10 mg by mouth daily as needed for allergies or rhinitis. [provider]  Active Self  cholecalciferol  (VITAMIN D3) 25 MCG (1000 UNIT) tablet [id number]  Take 1,000 Units by mouth daily. [provider]  Active Self  citalopram  (CELEXA ) 10 MG tablet [id number]  TAKE 1 TABLET BY MOUTH DAILY  Patient taking differently: Take 10 mg by mouth in the morning.   [name] HERO, MD  Active Self  Dulaglutide  (TRULICITY ) 4.5 MG/0.5ML SOPN [id number]  Inject 4.5 mg as directed once a week.  Patient taking differently: Inject 4.5 mg as directed every Sunday.   Shamleffer, Donell Cardinal, MD  Active Self           Med Note MARISA, NATHANEL SAILOR   Thu Jul 30, 2024  2:16 PM)    FARXIGA  10 MG TABS tablet [id number]  TAKE 1 TABLET BY MOUTH DAILY. Shamleffer, Donell Cardinal, MD  Active Self  ferrous sulfate  324 MG TBEC [id number]  Take 324 mg by mouth daily with breakfast. [provider]  Active Self  finasteride  (PROSCAR ) 5 MG tablet [id number]  TAKE 1 TABLET(5 MG) BY MOUTH DAILY [name] HERO, MD  Active Self  fluticasone  (FLONASE ) 50 MCG/ACT nasal spray [id number]  Place 2 sprays into both nostrils daily.  Patient taking differently: Place 2 sprays into both nostrils daily as needed for allergies or rhinitis.   [name], Robert S, MD  Active Self  fluticasone -salmeterol GARETH INHUB) 250-50 MCG/ACT AEPB [id number]  Inhale 1  puff into the lungs in the morning and at bedtime. [provider]  Active Self           Med Note MARISA, NATHANEL LOISE Schaumann Jul 30, 2024  2:43 PM) PROVIDER: The patient stated he uses this, but I did not see a recent fill of it  guaiFENesin  (MUCINEX ) 600 MG 12 hr tablet [id number]  Take 2 tablets (1,200 mg total) by mouth 2 (two) times daily.  Patient taking differently: Take 600 mg by mouth in the morning and at bedtime.   Cheryle Page, MD  Active Self  hydrocortisone  2.5 % cream [id number]  Apply 1 application  topically daily as needed (Irritation). [provider]  Active Self  insulin  aspart (NOVOLOG ) 100 UNIT/ML injection [id number]  Inject 7-9 Units into the skin 3 (three) times daily before meals. Kassie Mallick, MD  Active Self  isosorbide  mononitrate (IMDUR ) 30 MG 24 hr tablet [id number]  Take 1 tablet (30 mg total) by mouth daily. Hayes Beckey CROME, NP  Active   ketoconazole (NIZORAL) 2 % cream [id number]  Apply 1 application  topically daily as needed for irritation (affected sites). [provider]  Active Self  Multiple Vitamins-Minerals (MULTIVITAMIN PO) [id number]  Take 1 tablet by mouth daily with breakfast. [provider]  Active Self  nystatin cream (MYCOSTATIN) [id number]  Apply 1 application  topically 2 (two) times daily as needed for dry skin (or irritation). [provider]  Active Self  OXYGEN  [id number]  Inhale 2 L/min into the lungs continuous. [provider]  Active Self  pantoprazole  (PROTONIX ) 40 MG tablet [id number]  Take 1 tablet (40 mg total) by mouth 2 (two) times daily before a meal. [name] HERO, MD  Active Self  polyethylene glycol powder (GLYCOLAX /MIRALAX ) 17 GM/SCOOP powder [id number]  Take 17 g by mouth daily as needed (constipation). [provider]  Active Self  potassium chloride  (KLOR-CON  M) 10 MEQ tablet [id number]  Take 2 tablets (20 mEq total) by mouth 2 (two) times daily.  Patient not taking: Reported on  08/10/2024   Fairy Frames, MD  Active   Protein JEAN UNFLAVORED PO) [id number]  Take 8-16 oz by mouth See admin instructions.  Mix 8-16 ounces into a recommended beverage and drink by mouth once a day [provider]  Active Self  rosuvastatin  (CRESTOR ) 20 MG tablet [id number]  Take 1 tablet (20 mg total) by mouth daily. Madie Jon Garre, PA  Active Self  Tiotropium Bromide  Monohydrate (SPIRIVA  RESPIMAT) 2.5 MCG/ACT AERS [id number]  Inhale 1 puff into the lungs daily. [provider]  Active Self           Med Note MARISA, NATHANEL LOISE Schaumann Jul 30, 2024  2:44 PM) PROVIDER: The patient stated he uses this, but I did not see a recent fill of it  torsemide  (DEMADEX ) 20 MG tablet [id number]  Take 2 tablets (40 mg total) by mouth 2 (two) times daily. Fairy Frames, MD  Active   traZODone  (DESYREL ) 100 MG tablet [id number]  TAKE 1 TABLET BY MOUTH AT  BEDTIME [name] HERO, MD  Active Self            Home Care and Equipment/Supplies: Were Home Health Services Ordered?: Yes Name of Home Health Agency:: [facility] Has Agency set up a time to come to your home?: No EMR reviewed for Home Health Orders: Orders present/patient has not received call (refer to CM for follow-up) Any new equipment or medical supplies ordered?: No  Functional Questionnaire: Do you need assistance with meal preparation?: Yes Do you need assistance with eating?: Yes Do you have difficulty maintaining continence: No Do you need assistance with getting out of bed/getting out of a chair/moving?: Yes Do you have difficulty managing or taking your medications?: No  Follow up appointments reviewed: PCP Follow-up appointment confirmed?: No Specialist Hospital Follow-up appointment confirmed?: Yes Date of Specialist follow-up appointment?: 08/10/24 Follow-Up Specialty Provider:: Heart Failure Clnic Do you need transportation to your follow-up appointment?: No (Grandson takes to appointment and needs early  morning) Do you understand care options if your condition(s) worsen?: Yes-patient verbalized understanding  SDOH Interventions Today    Flowsheet Row Most Recent Value  SDOH Interventions   Food Insecurity Interventions Intervention Not Indicated  Housing Interventions Intervention Not Indicated  Transportation Interventions Intervention Not Indicated  Utilities Interventions Intervention Not Indicated    Goals Addressed             This Visit's Progress    [facility] Transitions of Care (TOC) Care Plan       Problems:  Recent Hospitalization for treatment of CHF No Hospital Follow Up Provider appointment Made several attempts to assist with scheduling PCP with patient's grandson and other appointments.  Patient preferred to see PCP, Dr. Thedora only.  Goal:  Over the next 30 days, the patient will not experience hospital readmission  Interventions:   Heart Failure Interventions: Advised patient to weigh each morning after emptying bladder Discussed importance of daily weight and advised patient to weigh and record daily Discussed the importance of keeping all appointments with provider Screening for signs and symptoms of depression related to chronic disease state  Assessed social determinant of health barriers   Patient Self Care Activities:  Attend all scheduled provider appointments Call pharmacy for medication refills 3-7 days in advance of running out of medications Call provider office for new concerns or questions  Notify RN Care Manager of TOC call rescheduling needs Participate in Transition of Care Program/Attend TOC scheduled calls Take medications as prescribed    Plan:  The care management team will reach out to the patient again over the next 5- 10 business days. The patient has been provided with contact information for the care management team and has been advised to call with any health related questions or concerns.       Richerd Fish, RN, BSN, CCM [facility], [facility] RN Care Manager Direct Dial: [phone]

## 2024-08-10 NOTE — Patient Instructions (Addendum)
 Labs done today. We will contact you only if your labs are abnormal.  RESTART Imdur  30mg (1 tablet) by mouth daily.   TAKE 2 EXTRA TABLETS OF TORSEMIDE  AND 2 EXTRA TABLETS OF POTASSIUM TODAY ONLY.   Please wear your compression hose daily, place them on as soon as you get up in the morning and remove before you go to bed at night.  No other medication changes were made. Please continue all current medications as prescribed.  Thank you for allowing us  to provide your heart failure care after your recent hospitalization. Please follow-up with General Cardiology .

## 2024-08-12 ENCOUNTER — Ambulatory Visit: Admitting: Cardiovascular Disease

## 2024-08-13 ENCOUNTER — Telehealth: Payer: Self-pay

## 2024-08-13 ENCOUNTER — Ambulatory Visit: Admitting: Family Medicine

## 2024-08-13 NOTE — Telephone Encounter (Signed)
 Please review note and advise on PT and want skilled nursing added.  Dm/cma

## 2024-08-13 NOTE — Telephone Encounter (Signed)
 Copied from CRM #[id number]. Topic: Clinical - Home Health Verbal Orders >> Aug 13, 2024  1:52 PM Viola F wrote: Caller/Agency: [facility]  Callback Number: [phone] Service Requested: Physical Therapy Frequency: 1x week for 3 weeks,  Any new concerns about the patient? Yes, wants to add skilled nursing eval for condition management

## 2024-08-14 ENCOUNTER — Encounter: Payer: Self-pay | Admitting: Physician Assistant

## 2024-08-14 ENCOUNTER — Ambulatory Visit: Admitting: Physician Assistant

## 2024-08-14 VITALS — BP 120/60 | HR 81 | Ht 63.0 in | Wt 215.0 lb

## 2024-08-14 DIAGNOSIS — J449 Chronic obstructive pulmonary disease, unspecified: Secondary | ICD-10-CM

## 2024-08-14 DIAGNOSIS — R1319 Other dysphagia: Secondary | ICD-10-CM

## 2024-08-14 DIAGNOSIS — I480 Paroxysmal atrial fibrillation: Secondary | ICD-10-CM

## 2024-08-14 DIAGNOSIS — Z9981 Dependence on supplemental oxygen: Secondary | ICD-10-CM

## 2024-08-14 DIAGNOSIS — J9611 Chronic respiratory failure with hypoxia: Secondary | ICD-10-CM

## 2024-08-14 DIAGNOSIS — Z86718 Personal history of other venous thrombosis and embolism: Secondary | ICD-10-CM

## 2024-08-14 DIAGNOSIS — E119 Type 2 diabetes mellitus without complications: Secondary | ICD-10-CM

## 2024-08-14 DIAGNOSIS — Z7901 Long term (current) use of anticoagulants: Secondary | ICD-10-CM

## 2024-08-14 DIAGNOSIS — Z794 Long term (current) use of insulin: Secondary | ICD-10-CM

## 2024-08-14 NOTE — Telephone Encounter (Signed)
 Left detailed VM to Okay PT and adding skilled nursing to secure VM.  Dm/cma

## 2024-08-14 NOTE — Patient Instructions (Signed)
 Continue Pantoprazole  twice daily to 09/09/24 then decrease to once daily.   _______________________________________________________  If your blood pressure at your visit was 140/90 or greater, please contact your primary care physician to follow up on this.  _______________________________________________________  If you are age 82 or older, your body mass index should be between 23-30. Your Body mass index is 38.09 kg/m. If this is out of the aforementioned range listed, please consider follow up with your Primary Care Provider.  If you are age 68 or younger, your body mass index should be between 19-25. Your Body mass index is 38.09 kg/m. If this is out of the aformentioned range listed, please consider follow up with your Primary Care Provider.   ________________________________________________________  The [facility] providers would like to encourage you to use MYCHART to communicate with providers for non-urgent requests or questions.  Due to long hold times on the telephone, sending your provider a message by [facility] may be a faster and more efficient way to get a response.  Please allow 48 business hours for a response.  Please remember that this is for non-urgent requests.  _______________________________________________________  [facility] is using a team-based approach to care.  Your team is made up of your doctor and two to three APPS. Our APPS (Nurse Practitioners and Physician Assistants) work with your physician to ensure care continuity for you. They are fully qualified to address your health concerns and develop a treatment plan. They communicate directly with your gastroenterologist to care for you. Seeing the Advanced Practice Practitioners on your physician's team can help you by facilitating care more promptly, often allowing for earlier appointments, access to diagnostic testing, procedures, and other specialty referrals.
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
## 2024-08-17 ENCOUNTER — Other Ambulatory Visit: Payer: Self-pay | Admitting: Emergency Medicine

## 2024-08-17 ENCOUNTER — Telehealth: Payer: Self-pay | Admitting: Cardiovascular Disease

## 2024-08-17 NOTE — Telephone Encounter (Signed)
 Returned call from patient. Patient initially did not want to take my call, stating I am on the phone with my health insurance company, disputing a charge from my hospital stay. I offered appt with Dr. Verlin today, patient stated I do not have a ride and I did not call, the [facility] nurse called. He states he is currently using a wheelchair and cannot drive.  Attempted to assess symptoms, patient stated he thinks he has gained 5 lbs since his last visit with heart failure clinic on 08/10/24, or possibly since he was discharged from hospital on 08/03/24. He denies new SOB, states he uses oxygen  at baseline for his COPD. Again offered appt today with Dr. Verlin, patient declines. He  states he would have to have his grandson drive him today and he doesn't know if he can get ahold of him in time, states he is unwilling to take a cab and spouse cannot drive him. Patient then stated he had to talk to his insurance company and ended the call. Forwarded to Dr. Verlin for advice.

## 2024-08-17 NOTE — Telephone Encounter (Signed)
 Pt c/o swelling/edema: STAT if pt has developed SOB within 24 hours  If swelling, where is the swelling located? Feet  How much weight have you gained and in what time span? 1 pound a day   Have you gained 2 pounds in a day or 5 pounds in a week? Yes  Do you have a log of your daily weights (if so, list)? Not at the moment   Are you currently taking a fluid pill? Yes  Are you currently SOB? Not at the moment   Have you traveled recently in a car or plane for an extended period of time?

## 2024-08-18 ENCOUNTER — Telehealth: Payer: Self-pay

## 2024-08-18 NOTE — Telephone Encounter (Signed)
 Home healthform for [facility] filled out and faxed to [phone].  Then given to Kristie for billing. Dm/cma

## 2024-08-18 NOTE — Progress Notes (Signed)
 ____________________________________________________________  Attending physician addendum:  Thank you for sending this case to me. I have reviewed the entire note and agree with the plan.  It is not clear how or if the empiric endoscopic dilation improved his symptoms, since there was no visible mucosal rent or other change after 19 mm wire-guided bougie dilation.  His clinical picture and barium study highly suggest an esophageal motility disorder.  If/when recurrence of symptoms occurs, I would not recommend a repeat upper endoscopy but instead consider an esophageal manometry to see if there is anything amenable to medical therapy.  Victory Brand, MD  ____________________________________________________________

## 2024-08-20 ENCOUNTER — Telehealth: Payer: Self-pay

## 2024-08-20 ENCOUNTER — Other Ambulatory Visit: Payer: Self-pay

## 2024-08-20 ENCOUNTER — Encounter: Payer: Self-pay | Admitting: Speech Pathology

## 2024-08-20 ENCOUNTER — Ambulatory Visit: Attending: Family Medicine | Admitting: Speech Pathology

## 2024-08-20 DIAGNOSIS — R1319 Other dysphagia: Secondary | ICD-10-CM | POA: Diagnosis not present

## 2024-08-20 DIAGNOSIS — R1310 Dysphagia, unspecified: Secondary | ICD-10-CM | POA: Insufficient documentation
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
## 2024-08-20 NOTE — Patient Instructions (Addendum)
 Visit Information  Thank you for taking time to visit with me today. Please don't hesitate to contact me if I can be of assistance to you before our next scheduled telephone appointment.  Our next appointment is by telephone on 08/26/24 at 2 PM  Following is a copy of your care plan:   Goals Addressed             This Visit's Progress    VBCI Transitions of Care (TOC) Care Plan   Improving    Problems: (Reviewed and updated 08/20/24 Recent Hospitalization for treatment of CHF No Hospital Follow Up Provider appointment Made several attempts to assist with scheduling PCP with patient's grandson and other appointments.  Patient preferred to see PCP, Dr. Thedora only. 08/20/24 Patient has made appointment for 08/21/24 for hospital follow up with PCP  Goal:  Over the next 30 days, the patient will not experience hospital readmission  Interventions:   Heart Failure Interventions: Advised patient to weigh each morning after emptying bladder Discussed importance of daily weight and advised patient to weigh and record daily Discussed the importance of keeping all appointments with provider Screening for signs and symptoms of depression related to chronic disease state  Assessed social determinant of health barriers   Patient Self Care Activities:  Attend all scheduled provider appointments Call pharmacy for medication refills 3-7 days in advance of running out of medications Call provider office for new concerns or questions  Notify RN Care Manager of TOC call rescheduling needs Participate in Transition of Care Program/Attend TOC scheduled calls Take medications as prescribed   Ongoing follow up with home health for HHPT strengthening for weakness. Fatigues with activity.  Plan:  The care management team will reach out to the patient again over the next 5- 10 business days. The patient has been provided with contact information for the care management team and has been advised to call with  any health related questions or concerns.  Follow up with PCP and updates on energy and any new medications at next week follow up.        Patient verbalizes understanding of instructions and care plan provided today and agrees to view in MyChart. Active MyChart status and patient understanding of how to access instructions and care plan via MyChart confirmed with patient.     The patient has been provided with contact information for the care management team and has been advised to call with any health related questions or concerns.  Follow up with provider re: any symptoms of respiratory and HF exacerbations, reviewed with patient.  Please call the care guide team at [phone] if you need to cancel or reschedule your appointment.   Please call the [place]  National Suicide Prevention Lifeline: 848-344-8377 or TTY: 559-029-8763 TTY (340)564-3332) to talk to a trained counselor if you are experiencing a Mental Health or Behavioral Health Crisis or need someone to talk to.  Richerd Fish, RN, BSN, CCM [facility], [facility] RN Care Manager Direct Dial: [phone]

## 2024-08-21 ENCOUNTER — Encounter: Payer: Self-pay | Admitting: Family Medicine

## 2024-08-21 ENCOUNTER — Ambulatory Visit: Admitting: Family Medicine

## 2024-08-21 VITALS — BP 118/66 | HR 78 | Temp 97.6°F | Ht 63.0 in | Wt 214.0 lb

## 2024-08-21 DIAGNOSIS — I10 Essential (primary) hypertension: Secondary | ICD-10-CM

## 2024-08-21 DIAGNOSIS — I480 Paroxysmal atrial fibrillation: Secondary | ICD-10-CM

## 2024-08-21 DIAGNOSIS — I5032 Chronic diastolic (congestive) heart failure: Secondary | ICD-10-CM | POA: Diagnosis not present

## 2024-08-21 NOTE — Assessment & Plan Note (Signed)
Blood pressure is in good control. Continue carvedilol 25 mg bid.

## 2024-08-21 NOTE — Assessment & Plan Note (Signed)
 Compensated. Reviewed and reconciled medications. Continue carvedilol  25 mg bid, torsemide  20 mg, two tablets (60 mg) daily, and dapagliflozin  10 mg daily. We discussed him taking an extra dose of torsemide  20 mg along with an extra potassium supplement tomorrow. I will see him back in 4 weeks to reassess. He notes he is schedule in cardiology in November. I will continue to manage home health referral. has not equipment needs at present.

## 2024-08-21 NOTE — Assessment & Plan Note (Signed)
 Appears in sinus rhythm today. Rate controlled. Continue carvedilol  25 mg bid and apixiban (Eliquis ) 2.5 mg bid.
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
## 2024-08-26 ENCOUNTER — Other Ambulatory Visit: Payer: Self-pay

## 2024-08-26 NOTE — Patient Instructions (Signed)
 Visit Information  Thank you for taking time to visit with me today. Please don't hesitate to contact me if I can be of assistance to you before our next scheduled telephone appointment.  Our next appointment is by telephone on 09/03/24 at 2:00 PM  Following is a copy of your care plan:   Goals Addressed             This Visit's Progress    VBCI Transitions of Care (TOC) Care Plan       Problems: (Reviewed and updated 08/26/24) Recent Hospitalization for treatment of CHF No Hospital Follow Up Provider appointment Made several attempts to assist with scheduling PCP with patient's grandson and other appointments.  Patient preferred to see PCP, Dr. Thedora only. 08/20/24 Patient has made appointment for 08/21/24 for hospital follow up with PCP Patient has a desire to have a lighter oxygen  carrying system to be more active and get out more.  Goal:  Over the next 30 days, the patient will not experience hospital readmission  Interventions:   Heart Failure Interventions: Advised patient to weigh each morning after emptying bladder Discussed importance of daily weight and advised patient to weigh and record daily Discussed the importance of keeping all appointments with provider Screening for signs and symptoms of depression related to chronic disease state  Assessed social determinant of health barriers   Patient Self Care Activities:  Attend all scheduled provider appointments Call pharmacy for medication refills 3-7 days in advance of running out of medications Call provider office for new concerns or questions  Notify RN Care Manager of TOC call rescheduling needs Participate in Transition of Care Program/Attend TOC scheduled calls Take medications as prescribed   Ongoing follow up with home health for HHPT and with [facility] Speech Therapystrengthening for weakness. Fatigues with activity.  Plan:  The care management team will reach out to the patient again over the next 5- 10 business  days. The patient has been provided with contact information for the care management team and has been advised to call with any health related questions or concerns.  Patient to follow up with PCP and updates on energy and any new medications at next week follow up. Patient desires a backpack type of oxygen  system since he is on continuous oxygen . Patient encouraged to have [facility] PT document his ability to carry a backpack oxygen  and send information to PCP, to assist insurance to help with determination.        Patient verbalizes understanding of instructions and care plan provided today and agrees to view in MyChart. Active MyChart status and patient understanding of how to access instructions and care plan via MyChart confirmed with patient.     The patient has been provided with contact information for the care management team and has been advised to call with any health related questions or concerns.  The care management team will reach out to the patient again over the next 5 - 10 business days.  Patient to work with [facility] PT regarding ability to care a backpack type oxygen  to increase mobility and activity.  Please call the care guide team at [phone] if you need to cancel or reschedule your appointment.   Please call the [place]  National Suicide Prevention Lifeline: (512) 458-1255 or TTY: (907)779-5352 TTY (254)509-7552) to talk to a trained counselor if you are experiencing a Mental Health or Behavioral Health Crisis or need someone to talk to.  Richerd Fish, RN, BSN, CCM [place]  [facility], [employer]  Health RN Care Manager Direct Dial: [phone]
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
## 2024-08-27 NOTE — Transitions of Care (Post Inpatient/ED Visit) (Deleted)
   08/27/2024  Name: [name]. MRN: [id number] DOB: March 16, 1941  {[id number]}

## 2024-08-27 NOTE — Progress Notes (Signed)
 This encounter was created in error - please disregard.

## 2024-08-28 ENCOUNTER — Other Ambulatory Visit: Payer: Self-pay | Admitting: Family Medicine

## 2024-08-28 DIAGNOSIS — F32A Depression, unspecified: Secondary | ICD-10-CM

## 2024-09-01 ENCOUNTER — Telehealth: Payer: Self-pay

## 2024-09-01 DIAGNOSIS — I5032 Chronic diastolic (congestive) heart failure: Secondary | ICD-10-CM

## 2024-09-01 DIAGNOSIS — J4489 Other specified chronic obstructive pulmonary disease: Secondary | ICD-10-CM

## 2024-09-01 NOTE — Telephone Encounter (Signed)
 Copied from CRM [id number]. Topic: Clinical - Order For Equipment >> Aug 28, 2024 11:28 AM Robinson DEL wrote: Reason for CRM: [facility] calling regarding a portable O2 concentrator for patient, states patient was told by provider if physical therapist approves the doctor would approve the portable O2 concentrator for patient and physical therapist does approve of it for the patient as well.  Boyce [phone]  [facility]ella [phone]

## 2024-09-01 NOTE — Telephone Encounter (Signed)
 Copied from CRM [id number]. Topic: Clinical - Home Health Verbal Orders >> Aug 28, 2024 11:24 AM Robinson DEL wrote: Caller/Agency: [facility] Callback Number: [phone] Secure Line Service Requested: Physical Therapy Frequency: 1 week 1, 1 Q 3 week 3, 1 Q 2 week 2 Any new concerns about the patient? No

## 2024-09-01 NOTE — Telephone Encounter (Signed)
Please review and advise. Dm/cma  

## 2024-09-01 NOTE — Telephone Encounter (Signed)
Please review and advise. Thanks. Dm/cma  

## 2024-09-01 NOTE — Addendum Note (Signed)
 Addended by: [name] HERO on: 09/01/2024 05:15 PM   Modules accepted: Orders

## 2024-09-02 NOTE — Telephone Encounter (Signed)
 Called David with [facility], he will look into where this needs be sent and get back with me. Dm/cma

## 2024-09-02 NOTE — Telephone Encounter (Signed)
 Verbal order given to Alm. Dm/cma

## 2024-09-02 NOTE — Telephone Encounter (Signed)
 Order faxed for [employer] @  [phone].  Patient notified VIA phone. Dm/cma

## 2024-09-03 ENCOUNTER — Other Ambulatory Visit: Payer: Self-pay | Admitting: Family Medicine

## 2024-09-03 ENCOUNTER — Other Ambulatory Visit: Payer: Self-pay

## 2024-09-03 ENCOUNTER — Telehealth: Payer: Self-pay

## 2024-09-03 DIAGNOSIS — I5032 Chronic diastolic (congestive) heart failure: Secondary | ICD-10-CM

## 2024-09-03 DIAGNOSIS — J4489 Other specified chronic obstructive pulmonary disease: Secondary | ICD-10-CM

## 2024-09-03 NOTE — Patient Instructions (Signed)
 Visit Information  Thank you for taking time to visit with me today. Please don't hesitate to contact me if I can be of assistance to you before our next scheduled telephone appointment.  Our next appointment is by telephone on 09/11/24 at 2:45 pm  Following is a copy of your care plan:   Goals Addressed             This Visit's Progress    [facility] Transitions of Care (TOC) Care Plan   On track    Problems: (Reviewed and updated 09/03/24) Recent Hospitalization for treatment of CHF No Hospital Follow Up Provider appointment Made several attempts to assist with scheduling PCP with patient's grandson and other appointments.  Patient preferred to see PCP, Dr. Thedora only. 08/20/24 Patient has made appointment for 08/21/24 for hospital follow up with PCP Patient has a desire to have a lighter oxygen  carrying system to be more active and get out more. 09/03/24 Patient able to work with PT on tolerance for backpack type of oxygen  and order was sent in 09/02/24  Goal:  Over the next 30 days, the patient will not experience hospital readmission  Interventions:   Heart Failure Interventions: Advised patient to weigh each morning after emptying bladder Discussed importance of daily weight and advised patient to weigh and record daily Discussed the importance of keeping all appointments with provider Screening for signs and symptoms of depression related to chronic disease state  Assessed social determinant of health barriers   Patient Self Care Activities:  Attend all scheduled provider appointments Call pharmacy for medication refills 3-7 days in advance of running out of medications Call provider office for new concerns or questions  Notify RN Care Manager of TOC call rescheduling needs Participate in Transition of Care Program/Attend TOC scheduled calls Take medications as prescribed   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 Fatigues  with activity.  Plan:  The care management team will reach out to the patient again over the next 5- 10 business days. The patient has been provided with contact information for the care management team and has been advised to call with any health related questions or concerns.  Patient to follow up with PCP and updates on energy and any new medications at next week follow up. 08/26/24 Patient desires a backpack type of oxygen  system since he is on continuous oxygen . Patient encouraged to have [facility] PT document his ability to carry a backpack oxygen  and send information to PCP, to assist insurance to help with determination. 09/03/24 Continue plan of care - awaiting backpack oxygen  to be able to move around more and get out of the house        Patient verbalizes understanding of instructions and care plan provided today and agrees to view in MyChart. Active MyChart status and patient understanding of how to access instructions and care plan via MyChart confirmed with patient.     Telephone follow up appointment with care management team member scheduled for: Follow up with provider re: any increase in symptoms with Shortness of breath, difficulty breathing with oxygen  on, fever, chills or feeling poorly  Please call the care guide team at [phone] if you need to cancel or reschedule your appointment.   Please call the [place]  National Suicide Prevention Lifeline: 703-785-0003 or TTY: 978-030-6851 TTY 4426751451) to talk to a trained counselor if you are experiencing a Mental Health or Behavioral Health Crisis or need someone to talk to.  Richerd Fish, RN, BSN, CCM [facility], [facility] RN Care Manager Direct Dial: [phone]
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
## 2024-09-04 LAB — HM DIABETES EYE EXAM

## 2024-09-06 ENCOUNTER — Other Ambulatory Visit: Payer: Self-pay | Admitting: Family Medicine

## 2024-09-06 DIAGNOSIS — M1A9XX0 Chronic gout, unspecified, without tophus (tophi): Secondary | ICD-10-CM

## 2024-09-07 ENCOUNTER — Encounter: Payer: Self-pay | Admitting: Family Medicine

## 2024-09-07 ENCOUNTER — Telehealth: Payer: Self-pay | Admitting: Family Medicine

## 2024-09-07 NOTE — Telephone Encounter (Signed)
 Patient dropped off document Home Health Certificate (Order ID [id number]), to be filled out by provider. Patient requested to send it back via Fax within 7-days. Document is located in providers tray at front office.Please advise at [phone]

## 2024-09-07 NOTE — Telephone Encounter (Signed)
 Form placed on providers desk to be signed upon returning on 09/15/24.  Dm/cma

## 2024-09-11 ENCOUNTER — Telehealth: Payer: Self-pay

## 2024-09-11 ENCOUNTER — Other Ambulatory Visit: Payer: Self-pay

## 2024-09-11 DIAGNOSIS — I739 Peripheral vascular disease, unspecified: Secondary | ICD-10-CM

## 2024-09-11 DIAGNOSIS — J9611 Chronic respiratory failure with hypoxia: Secondary | ICD-10-CM

## 2024-09-11 DIAGNOSIS — E669 Obesity, unspecified: Secondary | ICD-10-CM

## 2024-09-11 DIAGNOSIS — N1832 Chronic kidney disease, stage 3b: Secondary | ICD-10-CM

## 2024-09-11 DIAGNOSIS — I5032 Chronic diastolic (congestive) heart failure: Secondary | ICD-10-CM

## 2024-09-11 NOTE — Telephone Encounter (Signed)
 Copied from CRM #[id number]. Topic: Clinical - Order For Equipment >> Sep 11, 2024  3:19 PM Pinkey ORN wrote: Reason for CRM: Oxygen  Request >> Sep 11, 2024  3:20 PM Pinkey ORN wrote: Patient called, stating [employer] hasn't received anything in regards to patient receiving the oxygen  and it needs to be resent. Please follow up
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
## 2024-09-14 ENCOUNTER — Telehealth: Payer: Self-pay

## 2024-09-14 DIAGNOSIS — I130 Hypertensive heart and chronic kidney disease with heart failure and stage 1 through stage 4 chronic kidney disease, or unspecified chronic kidney disease: Secondary | ICD-10-CM | POA: Diagnosis not present

## 2024-09-14 DIAGNOSIS — E1151 Type 2 diabetes mellitus with diabetic peripheral angiopathy without gangrene: Secondary | ICD-10-CM

## 2024-09-14 DIAGNOSIS — N2581 Secondary hyperparathyroidism of renal origin: Secondary | ICD-10-CM

## 2024-09-14 DIAGNOSIS — N1832 Chronic kidney disease, stage 3b: Secondary | ICD-10-CM

## 2024-09-14 DIAGNOSIS — E1122 Type 2 diabetes mellitus with diabetic chronic kidney disease: Secondary | ICD-10-CM | POA: Diagnosis not present

## 2024-09-14 DIAGNOSIS — I2510 Atherosclerotic heart disease of native coronary artery without angina pectoris: Secondary | ICD-10-CM

## 2024-09-14 DIAGNOSIS — I5033 Acute on chronic diastolic (congestive) heart failure: Secondary | ICD-10-CM | POA: Diagnosis not present

## 2024-09-14 DIAGNOSIS — I5022 Chronic systolic (congestive) heart failure: Secondary | ICD-10-CM | POA: Diagnosis not present

## 2024-09-14 DIAGNOSIS — I480 Paroxysmal atrial fibrillation: Secondary | ICD-10-CM

## 2024-09-14 DIAGNOSIS — I700 Atherosclerosis of aorta: Secondary | ICD-10-CM

## 2024-09-14 DIAGNOSIS — D631 Anemia in chronic kidney disease: Secondary | ICD-10-CM

## 2024-09-14 DIAGNOSIS — I340 Nonrheumatic mitral (valve) insufficiency: Secondary | ICD-10-CM

## 2024-09-14 NOTE — Transitions of Care (Post Inpatient/ED Visit) (Signed)
 09/14/2024  Patient ID: Marinell Catheline Raddle., male   DOB: 1941-03-02, 83 y.o.   MRN: [id number]  Patient had left a voicemail regarding contacting [employer] regarding his portable oxygen  request. Contacted Vede at [employer] and she states that they had not received a fax order. Contacted Dana at [employer] and spoke with Gentry regarding orders for oxygen  request. Checking to see another order can be faxed to [employer] [phone] with Please evaluate and dispense POC to them. Spoke with Rosina at [employer]. Also, Spoke with the patient, HIPAA verified to update him on the request made today for having the oxygen  request faxed to [employer] from [place] [facility].  Richerd Fish, RN, BSN, CCM [facility], [facility] Health RN Care Manager Direct Dial: [phone]

## 2024-09-14 NOTE — Telephone Encounter (Signed)
 Form signed and faxed to [facility] @ [phone]. X=original given to Kristie for billing. Dm/cma

## 2024-09-15 ENCOUNTER — Telehealth: Payer: Self-pay

## 2024-09-15 NOTE — Telephone Encounter (Signed)
 Order refaxed this morning. Dm/cma

## 2024-09-15 NOTE — Telephone Encounter (Signed)
 Copied from CRM [id number]. Topic: General - Other >> Sep 14, 2024  4:14 PM Rosina BIRCH wrote: Reason for CRM: victoria from [employer] called stating she was disconnected from an agent and was calling back. [place] stated the office faxed an order to [employer] on 9/10 but they said they never received it and [place] want to know if the order can be refaxed with it saying Please evaluate and dispense POC [employer] fax-[phone] CB [phone]

## 2024-09-15 NOTE — Telephone Encounter (Signed)
See other message.  Dm/cma  

## 2024-09-21 ENCOUNTER — Encounter: Payer: Self-pay | Admitting: Family Medicine

## 2024-09-21 ENCOUNTER — Ambulatory Visit: Payer: Self-pay | Admitting: Family Medicine

## 2024-09-21 ENCOUNTER — Ambulatory Visit: Admitting: Family Medicine

## 2024-09-21 VITALS — BP 114/61 | HR 88 | Temp 97.0°F | Resp 18 | Wt 218.0 lb

## 2024-09-21 DIAGNOSIS — Z7985 Long-term (current) use of injectable non-insulin antidiabetic drugs: Secondary | ICD-10-CM

## 2024-09-21 DIAGNOSIS — N1832 Chronic kidney disease, stage 3b: Secondary | ICD-10-CM | POA: Diagnosis not present

## 2024-09-21 DIAGNOSIS — I10 Essential (primary) hypertension: Secondary | ICD-10-CM | POA: Diagnosis not present

## 2024-09-21 DIAGNOSIS — Z6838 Body mass index (BMI) 38.0-38.9, adult: Secondary | ICD-10-CM

## 2024-09-21 DIAGNOSIS — Z7984 Long term (current) use of oral hypoglycemic drugs: Secondary | ICD-10-CM

## 2024-09-21 DIAGNOSIS — Z794 Long term (current) use of insulin: Secondary | ICD-10-CM | POA: Diagnosis not present

## 2024-09-21 DIAGNOSIS — E782 Mixed hyperlipidemia: Secondary | ICD-10-CM

## 2024-09-21 DIAGNOSIS — I5032 Chronic diastolic (congestive) heart failure: Secondary | ICD-10-CM

## 2024-09-21 DIAGNOSIS — I2510 Atherosclerotic heart disease of native coronary artery without angina pectoris: Secondary | ICD-10-CM | POA: Diagnosis not present

## 2024-09-21 DIAGNOSIS — J4489 Other specified chronic obstructive pulmonary disease: Secondary | ICD-10-CM | POA: Diagnosis not present

## 2024-09-21 DIAGNOSIS — E1122 Type 2 diabetes mellitus with diabetic chronic kidney disease: Secondary | ICD-10-CM | POA: Diagnosis not present

## 2024-09-21 DIAGNOSIS — I480 Paroxysmal atrial fibrillation: Secondary | ICD-10-CM

## 2024-09-21 LAB — URINALYSIS, ROUTINE W REFLEX MICROSCOPIC
Bilirubin Urine: NEGATIVE
Ketones, ur: NEGATIVE
Nitrite: NEGATIVE
Specific Gravity, Urine: 1.01 (ref 1.000–1.030)
Total Protein, Urine: NEGATIVE
Urine Glucose: >=1000 — AB
Urobilinogen, UA: 0.2 (ref 0.0–1.0)
pH: 6 (ref 5.0–8.0)

## 2024-09-21 LAB — LIPID PANEL
Cholesterol: 131 mg/dL (ref 0–200)
HDL: 52.2 mg/dL (ref >39.00)
LDL Cholesterol: 61 mg/dL (ref 0–99)
NonHDL: 78.92
Total CHOL/HDL Ratio: 3
Triglycerides: 92 mg/dL (ref 0.0–149.0)
VLDL: 18.4 mg/dL (ref 0.0–40.0)

## 2024-09-21 LAB — MICROALBUMIN / CREATININE URINE RATIO
Creatinine,U: 30.9 mg/dL
Microalb Creat Ratio: 192.6 mg/g — ABNORMAL HIGH (ref 0.0–30.0)
Microalb, Ur: 5.9 mg/dL — ABNORMAL HIGH (ref 0.0–1.9)

## 2024-09-21 LAB — BASIC METABOLIC PANEL WITH GFR
BUN: 40 mg/dL — ABNORMAL HIGH (ref 6–23)
CO2: 29 meq/L (ref 19–32)
Calcium: 9.3 mg/dL (ref 8.4–10.5)
Chloride: 99 meq/L (ref 96–112)
Creatinine, Ser: 1.39 mg/dL (ref 0.40–1.50)
GFR: 47.25 mL/min — ABNORMAL LOW (ref >60.00)
Glucose, Bld: 135 mg/dL — ABNORMAL HIGH (ref 70–99)
Potassium: 4.2 meq/L (ref 3.5–5.1)
Sodium: 135 meq/L (ref 135–145)

## 2024-09-21 LAB — HEMOGLOBIN A1C: Hgb A1c MFr Bld: 6.9 % — ABNORMAL HIGH (ref 4.6–6.5)

## 2024-09-21 NOTE — Assessment & Plan Note (Signed)
 I will reassess lipids today. Continue rosuvastatin  to 20 mg daily.

## 2024-09-21 NOTE — Assessment & Plan Note (Signed)
Stable. No angina. Continue carvedilol 25 mg bid and isosorbide mononitrate 60 mg daily.

## 2024-09-21 NOTE — Assessment & Plan Note (Signed)
 At goal. I will check annual DM labs today. Continue dapagliflozin  10 mg daily, dulaglutide  (Trulicity ) 4.5 mg weekly, and insulin  aspart sliding scale (7-9 units TID with meals). Continue to follow with Dr. Sam.

## 2024-09-21 NOTE — Assessment & Plan Note (Signed)
 Stable. Continue oxygen  use. Continue fluticasone -salmeterol (Wixela) daily and albuterol  as needed.

## 2024-09-21 NOTE — Assessment & Plan Note (Signed)
 Stable. Continue focus on blood pressure and glucose control, adequate hydration, and avoidance of nephrotoxic medications. Continue SGLT2i. Continue to work with nephrology.

## 2024-09-21 NOTE — Assessment & Plan Note (Signed)
 Appears compensated, despite mild weight gain. Continue carvedilol  25 mg bid, torsemide  20 mg, two tablets (40 mg) twice daily, and dapagliflozin  10 mg daily.
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
## 2024-09-21 NOTE — Assessment & Plan Note (Signed)
 Limitations to physical activity. We will continue to monitor this.

## 2024-09-21 NOTE — Assessment & Plan Note (Signed)
 Stable. Continue carvedilol  25 mg bid for rate control and apixiban (Eliquis ) 2.5 mg bid for stroke prevention.

## 2024-09-21 NOTE — Assessment & Plan Note (Signed)
Blood pressure is in good control. Continue carvedilol 25 mg bid.

## 2024-09-22 ENCOUNTER — Other Ambulatory Visit: Payer: Self-pay
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
## 2024-09-23 NOTE — Progress Notes (Signed)
 Please call [name] and ask that he take his torsemide  as directed by his cardiologist, which is 40 mg twice a day. He should also call his cardiologist today and discuss his weight gain and the way he has been taking his diuretic.

## 2024-09-23 NOTE — Progress Notes (Signed)
 I will ask my MA to reach out. Due to Mr. Pressnell fragile health, I do feel his diuretic use should be managed by his cardiologist and his heart failure specialist and not me. Even with their input, he ended up with acute kidney injury the last time this was manipulated outpatient.

## 2024-10-06 ENCOUNTER — Other Ambulatory Visit: Payer: Self-pay

## 2024-10-06 NOTE — Patient Instructions (Addendum)
 Visit Information  Thank you for taking time to visit with me today. Please don't hesitate to contact me if I can be of assistance to you before our next scheduled appointment.  Your next care management appointment is by telephone on 11/04/24 at 3:00 pm   Please call the care guide team at [phone] if you need to cancel, schedule, or reschedule an appointment.   Please call the Suicide and Crisis Lifeline: 988 call the [place]  National Suicide Prevention Lifeline: 437-541-3201 or TTY: 878-397-0726 TTY (201)087-6801) to talk to a trained counselor if you are experiencing a Mental Health or Behavioral Health Crisis or need someone to talk to.  Heddy Shutter, RN, MSN, BSN, CCM [place]  [facility], Population Health Case Manager Phone: [phone]    Advance Directive  Advance directives are legal papers that state your wishes about health care decisions. They let your wishes be known to family, friends, and health care providers if you become unable to speak for yourself.  You should write these papers out over time rather than all at once. They can be changed and updated at any time. The types of advance directives include: Medical power of attorney (POA). Living wills. Do not resuscitate (DNR) or do not attempt resuscitation (DNAR) orders. What are a health care proxy and medical POA? A health care proxy is also called a health care agent. It's a person you choose to make medical decisions for you when you can't make them for yourself. In most cases, a proxy is a trusted friend or family member. A medical POA is legal paperwork that names your proxy. It may need to be: Signed. Notarized. Dated. Copied. Witnessed. Added to your medical record. You may also want to choose someone to handle your money if you can't do so. This is called a durable POA for finances. It's separate from a medical POA. You may choose your health care proxy or someone else to act as  your agent in money matters. If you don't have a proxy, or if the proxy may not be acting in your best interest, a court may choose a guardian to act on your behalf. What is a living will? A living will is legal paperwork that states your wishes about medical care. Providers should keep a copy of it in your medical record. You may want to give a copy to family members or friends. You can also keep a card in your wallet to let loved ones know you have a living will and where they can find it. A living will may be used if: You're very sick with something that will end your life. You become disabled. You can't make decisions or speak for yourself. Your living will should include whether: To use or not use life support equipment. This may include machines to filter your blood or to help you breathe. You want a DNR or DNAR order. This tells providers not to use CPR if your heart or breathing stops. To use or not use tube feeding. You want to be given foods and fluids. You want a type of comfort care called palliative care. This may be given when the goal for treatment becomes comfort rather than a cure. You want to donate your organs and tissues. A living will doesn't say what to do with your money and property if you pass away. What is a DNR or DNAR? A DNR or DNAR order is a request not to have CPR. If you don't have one  of these orders, a provider will try to help you if your heart stops or you stop breathing.  If you plan to have surgery, talk with your provider about your DNR or DNAR order. What happens if I don't have an advance directive? Each state has its own laws about advance directives. Some states assign family decision makers to act on your behalf if you don't have an advance directive.  Check with your provider, attorney, or state representative about the laws in your state. Where to find more information Each state has its own laws about advance directives. You can look up these laws  at: https://rodriguez-phillips.com/ This information is not intended to replace advice given to you by your health care provider. Make sure you discuss any questions you have with your health care provider. Document Revised: 04/29/2023 Document Reviewed: 04/29/2023 Elsevier Patient Education  2024 [employer].
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
## 2024-10-13 ENCOUNTER — Other Ambulatory Visit: Payer: Self-pay

## 2024-10-13 DIAGNOSIS — E782 Mixed hyperlipidemia: Secondary | ICD-10-CM

## 2024-10-15 ENCOUNTER — Other Ambulatory Visit: Payer: Self-pay

## 2024-10-15 DIAGNOSIS — E782 Mixed hyperlipidemia: Secondary | ICD-10-CM

## 2024-10-15 MED ORDER — ROSUVASTATIN CALCIUM 20 MG PO TABS: 20.0000 mg | ORAL_TABLET | Freq: Every day | ORAL | 0 refills | Status: DC

## 2024-10-16 ENCOUNTER — Other Ambulatory Visit: Payer: Self-pay | Admitting: Cardiovascular Disease

## 2024-10-16 DIAGNOSIS — I2510 Atherosclerotic heart disease of native coronary artery without angina pectoris: Secondary | ICD-10-CM

## 2024-10-20 MED ORDER — ISOSORBIDE MONONITRATE ER 30 MG PO TB24: 30.0000 mg | ORAL_TABLET | Freq: Every day | ORAL | 0 refills | Status: DC

## 2024-10-23 ENCOUNTER — Other Ambulatory Visit: Payer: Self-pay | Admitting: Internal Medicine

## 2024-10-23 ENCOUNTER — Other Ambulatory Visit: Payer: Self-pay | Admitting: Family Medicine

## 2024-10-23 DIAGNOSIS — N2581 Secondary hyperparathyroidism of renal origin: Secondary | ICD-10-CM

## 2024-10-23 DIAGNOSIS — I2510 Atherosclerotic heart disease of native coronary artery without angina pectoris: Secondary | ICD-10-CM

## 2024-10-23 DIAGNOSIS — F5101 Primary insomnia: Secondary | ICD-10-CM

## 2024-10-23 DIAGNOSIS — I5032 Chronic diastolic (congestive) heart failure: Secondary | ICD-10-CM

## 2024-10-23 DIAGNOSIS — E1169 Type 2 diabetes mellitus with other specified complication: Secondary | ICD-10-CM
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
## 2024-10-29 ENCOUNTER — Ambulatory Visit: Attending: Cardiovascular Disease | Admitting: Cardiovascular Disease

## 2024-10-29 ENCOUNTER — Other Ambulatory Visit (HOSPITAL_COMMUNITY): Payer: Self-pay

## 2024-10-29 VITALS — BP 132/60 | HR 88 | Ht 63.0 in | Wt 218.0 lb

## 2024-10-29 DIAGNOSIS — I482 Chronic atrial fibrillation, unspecified: Secondary | ICD-10-CM

## 2024-10-29 DIAGNOSIS — I10 Essential (primary) hypertension: Secondary | ICD-10-CM

## 2024-10-29 DIAGNOSIS — I2510 Atherosclerotic heart disease of native coronary artery without angina pectoris: Secondary | ICD-10-CM | POA: Diagnosis not present

## 2024-10-29 DIAGNOSIS — E782 Mixed hyperlipidemia: Secondary | ICD-10-CM | POA: Diagnosis not present

## 2024-10-29 DIAGNOSIS — I428 Other cardiomyopathies: Secondary | ICD-10-CM

## 2024-10-29 MED ORDER — POTASSIUM CHLORIDE ER 10 MEQ PO TBCR
10.0000 meq | EXTENDED_RELEASE_TABLET | Freq: Two times a day (BID) | ORAL | 3 refills | Status: AC
  Filled 2024-10-29 (×2): qty 180, 90d supply, fill #0

## 2024-10-29 NOTE — Patient Instructions (Signed)
 Medication Instructions:  Your physician recommends that you continue on your current medications as directed. Please refer to the Current Medication list given to you today.  *If you need a refill on your cardiac medications before your next appointment, please call your pharmacy*  Lab Work: none If you have labs (blood work) drawn today and your tests are completely normal, you will receive your results only by: MyChart Message (if you have MyChart) OR A paper copy in the mail If you have any lab test that is abnormal or we need to change your treatment, we will call you to review the results.  Testing/Procedures: none  Follow-Up: At [facility], you and your health needs are our priority.  As part of our continuing mission to provide you with exceptional heart care, our providers are all part of one team.  This team includes your primary Cardiologist (physician) and Advanced Practice Providers or APPs (Physician Assistants and Nurse Practitioners) who all work together to provide you with the care you need, when you need it.  Your next appointment:   6 month(s)  Provider:  Dr Verlin One of our Advanced Practice Providers (APPs): Morse Clause, PA-C  Lamarr Satterfield, NP Miriam Shams, NP  Olivia Pavy, PA-C Josefa Beauvais, NP  Leontine Salen, PA-C Orren Fabry, PA-C  Hao Meng, PA-C Ernest Dick, NP  Damien Braver, NP Jon Hails, PA-C  Waddell Donath, PA-C    Dayna Dunn, PA-C  Scott Weaver, PA-C Lum Louis, NP Katlyn West, NP Callie Goodrich, PA-C  Xika Zhao, NP Sheng Haley, PA-C    Kathleen Johnson, PA-C       We recommend signing up for the patient portal called MyChart.  Sign up information is provided on this After Visit Summary.  MyChart is used to connect with patients for Virtual Visits (Telemedicine).  Patients are able to view lab/test results, encounter notes, upcoming appointments, etc.  Non-urgent messages can be sent to your provider as well.   To  learn more about what you can do with MyChart, go to [email or web address].   Other Instructions

## 2024-11-04 ENCOUNTER — Other Ambulatory Visit: Payer: Self-pay

## 2024-11-04 ENCOUNTER — Other Ambulatory Visit: Payer: Self-pay | Admitting: Emergency Medicine
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
## 2024-11-04 NOTE — Patient Instructions (Signed)
 Visit Information  Thank you for taking time to visit with me today. Please don't hesitate to contact me if I can be of assistance to you before our next scheduled appointment.  Your next care management appointment is by telephone on 12/04/24 at 3:00 pm.   Please call the care guide team at [phone] if you need to cancel, schedule, or reschedule an appointment.   Please call the Suicide and Crisis Lifeline: 988 call the [place]  National Suicide Prevention Lifeline: 319-106-4302 or TTY: 623-632-1502 TTY 5194731576) to talk to a trained counselor if you are experiencing a Mental Health or Behavioral Health Crisis or need someone to talk to.  Heddy Shutter, RN, MSN, BSN, CCM [place]  [facility], Population Health Case Manager Phone: [phone]

## 2024-11-05 NOTE — Telephone Encounter (Signed)
 Pt has upcoming appt on 12/02/24 with [name]. Pt must keep appt for any further refills.

## 2024-11-14 ENCOUNTER — Other Ambulatory Visit: Payer: Self-pay | Admitting: Cardiovascular Disease

## 2024-11-14 DIAGNOSIS — I2510 Atherosclerotic heart disease of native coronary artery without angina pectoris: Secondary | ICD-10-CM

## 2024-11-24 ENCOUNTER — Other Ambulatory Visit: Payer: Self-pay | Admitting: Cardiovascular Disease

## 2024-11-24 DIAGNOSIS — E782 Mixed hyperlipidemia: Secondary | ICD-10-CM

## 2024-12-02 ENCOUNTER — Ambulatory Visit: Admitting: Emergency Medicine

## 2024-12-02 ENCOUNTER — Telehealth: Payer: Self-pay | Admitting: Internal Medicine

## 2024-12-02 ENCOUNTER — Other Ambulatory Visit: Payer: Self-pay | Admitting: Emergency Medicine

## 2024-12-02 ENCOUNTER — Encounter: Payer: Self-pay | Admitting: Emergency Medicine

## 2024-12-02 VITALS — BP 126/74 | HR 89 | Ht 63.0 in | Wt 218.5 lb

## 2024-12-02 DIAGNOSIS — J4489 Other specified chronic obstructive pulmonary disease: Secondary | ICD-10-CM

## 2024-12-02 DIAGNOSIS — J9611 Chronic respiratory failure with hypoxia: Secondary | ICD-10-CM | POA: Diagnosis not present

## 2024-12-02 DIAGNOSIS — J9612 Chronic respiratory failure with hypercapnia: Secondary | ICD-10-CM | POA: Diagnosis not present

## 2024-12-02 DIAGNOSIS — R5383 Other fatigue: Secondary | ICD-10-CM

## 2024-12-02 DIAGNOSIS — J301 Allergic rhinitis due to pollen: Secondary | ICD-10-CM

## 2024-12-02 DIAGNOSIS — Z9884 Bariatric surgery status: Secondary | ICD-10-CM | POA: Diagnosis not present

## 2024-12-02 DIAGNOSIS — R000 Tachycardia, unspecified: Secondary | ICD-10-CM

## 2024-12-02 MED ORDER — FLUTICASONE PROPIONATE 50 MCG/ACT NA SUSP: 2.0000 | Freq: Every day | NASAL | 3 refills | Status: AC

## 2024-12-02 NOTE — Assessment & Plan Note (Signed)
 Overall fairly well compensated.  His most recent issues have been with volume overload, diastolic CHF, A-fib/hypertension.  No episodes of pneumonia, no antibiotics or prednisone  per his report.  Plan to continue same maintenance regimen.  He states that he gets his medications through the [facility], refilled by his physician there.  If they need us  to fill any of his COPD meds we're happy to do so.  Vaccines are up-to-date  Please continue your Spiriva  and your Wixela as you have been taking them.  Remember to rinse and gargle after the Wixela. Keep your Ventolin  available to use 2 puffs when you need it for shortness of breath, chest tightness, wheezing. Flu shot and COVID-19 vaccines are both up-to-date. Follow Dr. Shelah in 1 year, call sooner if you have any problems so we can see you to evaluate

## 2024-12-02 NOTE — Progress Notes (Signed)
 Subjective:    Patient ID: [name] Catheline Raddle., male    DOB: 1941/08/06, 83 y.o.   MRN: [id number]  COPD His past medical history is significant for COPD.   ROV 12/02/2024 --[name] is an 83 year old gentleman whom I have followed for COPD and nocturnal hypoxemia.  [name] also has a history of hypertension and atrial fibrillation with associated diastolic dysfunction, lap band surgery complicated by intermittent aspiration that has caused recurrent right lower lobe pneumonia. Today [name] reports that [name] was hospitalized in June and August >> [name] underwent esophageal dilation, had his lap band loosened / deflated, was treated aggressively w diuretics for decompensated A Fib and dCHF.  [name] remains on Spiriva  and Wixela. [name] uses ventolin  a few times a week. His dysphagia is better, no aspiration sx currently. Minimal cough, but [name] is having mucous in his throat when [name] lays down supine to sleep - can feel like [name] is drowning.  Remains on Flonase . His flu shot is up to date, also got the COVID booster up to date. No snoring. [name] occasionally naps during the day.     Review of Systems As per HPI     Objective:   Physical Exam Vitals:   12/02/24 1048  BP: 126/74  Pulse: 89  SpO2: 100%  Weight: 218 lb 8 oz (99.1 kg)  Height: 5' 3 (1.6 m)    Gen: Pleasant, overwt, in no distress,  normal affect  ENT: No lesions,  mouth clear,  oropharynx clear, no postnasal drip  Neck: No JVD, no stridor  Lungs: No use of accessory muscles, distant, clear bilaterally without wheeze, decreased at both bases.   CV: regular, no M gallops, trace ankle edema  Musculoskeletal: No deformities, no cyanosis or clubbing  Neuro: alert, non focal  Skin: Warm, no lesions or rash       Assessment & Plan:  COPD with chronic bronchitis (HCC) Overall fairly well compensated.  His most recent issues have been with volume overload, diastolic CHF, A-fib/hypertension.  No episodes of pneumonia, no antibiotics or prednisone   per his report.  Plan to continue same maintenance regimen.  [name] states that [name] gets his medications through the [facility], refilled by his physician there.  If they need us  to fill any of his COPD meds we're happy to do so.  Vaccines are up-to-date  Please continue your Spiriva  and your Wixela as you have been taking them.  Remember to rinse and gargle after the Wixela. Keep your Ventolin  available to use 2 puffs when you need it for shortness of breath, chest tightness, wheezing. Flu shot and COVID-19 vaccines are both up-to-date. Follow Dr. Shelah in 1 year, call sooner if you have any problems so we can see you to evaluate  Allergic rhinitis Continue fluticasone  nasal spray on a schedule.  Chronic respiratory failure with hypoxia and hypercapnia (HCC) Continue supplemental oxygen  at all times.  Good compliance reported.  History of laparoscopic adjustable gastric banding History of dysphagia and aspiration with right lower lobe pneumonia in the past.  His swallowing is much better since his Lap-Band has been decompressed.  Fatigue Discussed with him possible sleep study.  For now [name] wants to defer.  We can revisit going forward if [name] would like to do so.  I personally spent a total of 40 minutes in the care of the patient today including preparing to see the patient, getting/reviewing separately obtained history, performing a medically appropriate exam/evaluation, counseling and educating, placing orders, documenting clinical information in  the EHR, independently interpreting results, and communicating results.   Lamar Chris, MD, PhD 12/02/2024, 11:14 AM  Pulmonary and Critical Care [phone] or if no answer [phone]

## 2024-12-02 NOTE — Assessment & Plan Note (Signed)
 Discussed with him possible sleep study.  For now he wants to defer.  We can revisit going forward if he would like to do so.

## 2024-12-02 NOTE — Assessment & Plan Note (Signed)
 Continue fluticasone  nasal spray on a schedule.

## 2024-12-02 NOTE — Telephone Encounter (Addendum)
 Patient's wife, Malakie Balis, came in to office today and brought a Secretary/administrator Patient Assistance Program Application to be completed.  The application is in Dr. Kris folder in the front office.

## 2024-12-02 NOTE — Assessment & Plan Note (Signed)
 Continue supplemental oxygen  at all times.  Good compliance reported.

## 2024-12-02 NOTE — Assessment & Plan Note (Signed)
 History of dysphagia and aspiration with right lower lobe pneumonia in the past.  His swallowing is much better since his Lap-Band has been decompressed.

## 2024-12-02 NOTE — Patient Instructions (Signed)
 Please continue your Spiriva  and your Wixela as you have been taking them.  Remember to rinse and gargle after the Wixela. Keep your Ventolin  available to use 2 puffs when you need it for shortness of breath, chest tightness, wheezing. Flu shot and COVID-19 vaccines are both up-to-date. Continue your oxygen  at all times the way you have been using it. Continue your fluticasone  nasal spray, 2 sprays each nostril once daily. We talked today about a possible evaluation for obstructive sleep apnea with a sleep study.  For now we will hold off on this.  We can revisit in the future if you would like to do so. Follow Dr. Shelah in 1 year, call sooner if you have any problems so we can see you to evaluate

## 2024-12-04 ENCOUNTER — Other Ambulatory Visit: Payer: Self-pay

## 2024-12-04 NOTE — Patient Instructions (Signed)
 Visit Information  Thank you for taking time to visit with me today. Please don't hesitate to contact me if I can be of assistance to you before our next scheduled appointment.  Your next care management appointment is by telephone on 01/07/25 at 3 pm  Please call the care guide team at [phone] if you need to cancel, schedule, or reschedule an appointment.   Please call the Suicide and Crisis Lifeline: 988 call the [place]  National Suicide Prevention Lifeline: 815-684-4711 or TTY: 361-398-2930 TTY 617-119-7525) to talk to a trained counselor if you are experiencing a Mental Health or Behavioral Health Crisis or need someone to talk to.  Patient agrees to review care plan and patient instructions in MyChart.  Warren Quivers RN CM Population Health-Complex Care Management [facility] [phone]
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
## 2024-12-18 ENCOUNTER — Other Ambulatory Visit: Payer: Self-pay | Admitting: Emergency Medicine

## 2024-12-23 ENCOUNTER — Ambulatory Visit: Payer: Self-pay | Admitting: Family Medicine

## 2024-12-23 ENCOUNTER — Encounter: Payer: Self-pay | Admitting: Family Medicine

## 2024-12-23 ENCOUNTER — Ambulatory Visit (INDEPENDENT_AMBULATORY_CARE_PROVIDER_SITE_OTHER): Admitting: Family Medicine

## 2024-12-23 VITALS — BP 122/70 | HR 90 | Temp 97.7°F | Ht 63.0 in | Wt 218.0 lb

## 2024-12-23 DIAGNOSIS — I5032 Chronic diastolic (congestive) heart failure: Secondary | ICD-10-CM

## 2024-12-23 DIAGNOSIS — I10 Essential (primary) hypertension: Secondary | ICD-10-CM | POA: Diagnosis not present

## 2024-12-23 DIAGNOSIS — N1832 Chronic kidney disease, stage 3b: Secondary | ICD-10-CM | POA: Diagnosis not present

## 2024-12-23 DIAGNOSIS — I2510 Atherosclerotic heart disease of native coronary artery without angina pectoris: Secondary | ICD-10-CM | POA: Diagnosis not present

## 2024-12-23 DIAGNOSIS — J4489 Other specified chronic obstructive pulmonary disease: Secondary | ICD-10-CM | POA: Diagnosis not present

## 2024-12-23 DIAGNOSIS — E1122 Type 2 diabetes mellitus with diabetic chronic kidney disease: Secondary | ICD-10-CM | POA: Diagnosis not present

## 2024-12-23 DIAGNOSIS — E782 Mixed hyperlipidemia: Secondary | ICD-10-CM

## 2024-12-23 DIAGNOSIS — Z794 Long term (current) use of insulin: Secondary | ICD-10-CM | POA: Diagnosis not present

## 2024-12-23 DIAGNOSIS — Z7984 Long term (current) use of oral hypoglycemic drugs: Secondary | ICD-10-CM | POA: Diagnosis not present

## 2024-12-23 DIAGNOSIS — Z7985 Long-term (current) use of injectable non-insulin antidiabetic drugs: Secondary | ICD-10-CM

## 2024-12-23 DIAGNOSIS — I480 Paroxysmal atrial fibrillation: Secondary | ICD-10-CM

## 2024-12-23 LAB — GLUCOSE, RANDOM: Glucose, Bld: 181 mg/dL — ABNORMAL HIGH (ref 70–99)

## 2024-12-23 LAB — HEMOGLOBIN A1C: Hgb A1c MFr Bld: 6.8 % — ABNORMAL HIGH (ref 4.6–6.5)

## 2024-12-23 NOTE — Assessment & Plan Note (Signed)
 Limitations to physical activity. We will continue to monitor this.

## 2024-12-23 NOTE — Assessment & Plan Note (Addendum)
 Appears compensated. Weight is stable and lower leg edema is not worse. However, he does appear to be having some orthopnea. I recommended he reach out to his cardiologist to discuss these symptoms. Continue carvedilol  25 mg bid, torsemide  20 mg, two tablets (40 mg) twice daily, and dapagliflozin  10 mg daily.

## 2024-12-23 NOTE — Assessment & Plan Note (Addendum)
 LDL cholesterol is at 61. Continue rosuvastatin  to 20 mg daily.
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
## 2024-12-23 NOTE — Assessment & Plan Note (Signed)
Stable. No angina. Continue carvedilol 25 mg bid and isosorbide mononitrate 60 mg daily.

## 2024-12-23 NOTE — Assessment & Plan Note (Signed)
 Stable. Continue focus on blood pressure and glucose control, adequate hydration, and avoidance of nephrotoxic medications. Continue SGLT2i. Continue to work with nephrology.

## 2024-12-23 NOTE — Assessment & Plan Note (Signed)
 Stable. Continue carvedilol  25 mg bid for rate control and apixiban (Eliquis ) 2.5 mg bid for stroke prevention.

## 2024-12-23 NOTE — Assessment & Plan Note (Signed)
 Stable. Continue oxygen  use. Continue fluticasone -salmeterol (Wixela) daily and albuterol  as needed.

## 2024-12-23 NOTE — Assessment & Plan Note (Signed)
 A1c has been at goal. I will check an A1c today. Continue dapagliflozin  10 mg daily, dulaglutide  (Trulicity ) 4.5 mg weekly, and insulin  aspart sliding scale (7-9 units TID with meals). Continue to follow with Dr. Sam.

## 2024-12-23 NOTE — Assessment & Plan Note (Signed)
Blood pressure is in good control. Continue carvedilol 25 mg bid.

## 2024-12-29 DIAGNOSIS — I2510 Atherosclerotic heart disease of native coronary artery without angina pectoris: Secondary | ICD-10-CM

## 2025-01-05 ENCOUNTER — Other Ambulatory Visit: Payer: Self-pay | Admitting: Internal Medicine

## 2025-01-05 DIAGNOSIS — N1832 Chronic kidney disease, stage 3b: Secondary | ICD-10-CM

## 2025-01-07 ENCOUNTER — Telehealth: Payer: Self-pay

## 2025-01-19 ENCOUNTER — Telehealth: Payer: Self-pay

## 2025-01-28 ENCOUNTER — Ambulatory Visit: Admitting: Internal Medicine

## 2025-02-01 ENCOUNTER — Telehealth: Payer: Self-pay

## 2025-03-23 ENCOUNTER — Ambulatory Visit: Admitting: Internal Medicine

## 2025-03-25 ENCOUNTER — Ambulatory Visit: Admitting: Family Medicine

## 2025-05-28 ENCOUNTER — Ambulatory Visit
